# Patient Record
Sex: Female | Born: 1950 | Race: Black or African American | Hispanic: No | Smoking: Never smoker
Health system: Southern US, Community
[De-identification: ages and names within clinical notes are randomized; demographics above are authoritative.]

## PROBLEM LIST (undated history)

## (undated) DIAGNOSIS — K219 Gastro-esophageal reflux disease without esophagitis: Secondary | ICD-10-CM

## (undated) DIAGNOSIS — D649 Anemia, unspecified: Secondary | ICD-10-CM

## (undated) DIAGNOSIS — E559 Vitamin D deficiency, unspecified: Secondary | ICD-10-CM

## (undated) DIAGNOSIS — I714 Abdominal aortic aneurysm, without rupture, unspecified: Secondary | ICD-10-CM

## (undated) DIAGNOSIS — I10 Essential (primary) hypertension: Secondary | ICD-10-CM

## (undated) DIAGNOSIS — F419 Anxiety disorder, unspecified: Secondary | ICD-10-CM

## (undated) DIAGNOSIS — C189 Malignant neoplasm of colon, unspecified: Secondary | ICD-10-CM

## (undated) HISTORY — DX: Malignant neoplasm of colon, unspecified: C18.9

## (undated) HISTORY — DX: Gastro-esophageal reflux disease without esophagitis: K21.9

## (undated) HISTORY — DX: Anxiety disorder, unspecified: F41.9

---

## 2004-05-23 ENCOUNTER — Other Ambulatory Visit: Admission: RE | Admit: 2004-05-23 | Discharge: 2004-05-23 | Payer: Self-pay | Admitting: Obstetrics and Gynecology

## 2008-02-25 ENCOUNTER — Other Ambulatory Visit: Admission: RE | Admit: 2008-02-25 | Discharge: 2008-02-25 | Payer: Self-pay | Admitting: Family Medicine

## 2015-07-02 DIAGNOSIS — C189 Malignant neoplasm of colon, unspecified: Secondary | ICD-10-CM

## 2015-07-02 HISTORY — DX: Malignant neoplasm of colon, unspecified: C18.9

## 2015-09-15 ENCOUNTER — Encounter (HOSPITAL_COMMUNITY): Payer: Self-pay | Admitting: *Deleted

## 2015-09-15 ENCOUNTER — Emergency Department (HOSPITAL_COMMUNITY): Admission: EM | Admit: 2015-09-15 | Discharge: 2015-09-15 | Discharge status: Absent without leave | Payer: Self-pay

## 2015-09-15 ENCOUNTER — Inpatient Hospital Stay (HOSPITAL_COMMUNITY): Payer: Medicare Other

## 2015-09-15 ENCOUNTER — Inpatient Hospital Stay (HOSPITAL_COMMUNITY)
Admission: EM | Admit: 2015-09-15 | Discharge: 2015-09-18 | DRG: 372 | Disposition: A | Discharge status: Home / Self Care | Payer: Medicare Other | Attending: Surgery | Admitting: Surgery

## 2015-09-15 DIAGNOSIS — Z888 Allergy status to other drugs, medicaments and biological substances status: Secondary | ICD-10-CM | POA: Diagnosis not present

## 2015-09-15 DIAGNOSIS — Z91010 Allergy to peanuts: Secondary | ICD-10-CM

## 2015-09-15 DIAGNOSIS — N289 Disorder of kidney and ureter, unspecified: Secondary | ICD-10-CM | POA: Diagnosis present

## 2015-09-15 DIAGNOSIS — N859 Noninflammatory disorder of uterus, unspecified: Secondary | ICD-10-CM | POA: Diagnosis present

## 2015-09-15 DIAGNOSIS — K5732 Diverticulitis of large intestine without perforation or abscess without bleeding: Secondary | ICD-10-CM | POA: Diagnosis present

## 2015-09-15 DIAGNOSIS — R000 Tachycardia, unspecified: Secondary | ICD-10-CM | POA: Diagnosis present

## 2015-09-15 DIAGNOSIS — Z91018 Allergy to other foods: Secondary | ICD-10-CM | POA: Diagnosis not present

## 2015-09-15 DIAGNOSIS — K353 Acute appendicitis with localized peritonitis: Principal | ICD-10-CM | POA: Diagnosis present

## 2015-09-15 DIAGNOSIS — D649 Anemia, unspecified: Secondary | ICD-10-CM | POA: Diagnosis present

## 2015-09-15 DIAGNOSIS — E876 Hypokalemia: Secondary | ICD-10-CM | POA: Diagnosis present

## 2015-09-15 DIAGNOSIS — Z79899 Other long term (current) drug therapy: Secondary | ICD-10-CM | POA: Diagnosis not present

## 2015-09-15 DIAGNOSIS — R1030 Lower abdominal pain, unspecified: Secondary | ICD-10-CM | POA: Diagnosis present

## 2015-09-15 DIAGNOSIS — I10 Essential (primary) hypertension: Secondary | ICD-10-CM | POA: Diagnosis present

## 2015-09-15 DIAGNOSIS — Z91013 Allergy to seafood: Secondary | ICD-10-CM

## 2015-09-15 DIAGNOSIS — K3532 Acute appendicitis with perforation and localized peritonitis, without abscess: Secondary | ICD-10-CM | POA: Diagnosis present

## 2015-09-15 DIAGNOSIS — D259 Leiomyoma of uterus, unspecified: Secondary | ICD-10-CM | POA: Diagnosis present

## 2015-09-15 HISTORY — DX: Essential (primary) hypertension: I10

## 2015-09-15 LAB — CBC
HEMATOCRIT: 31.5 % — AB (ref 36.0–46.0)
Hemoglobin: 10.3 g/dL — ABNORMAL LOW (ref 12.0–15.0)
MCH: 24.1 pg — AB (ref 26.0–34.0)
MCHC: 32.7 g/dL (ref 30.0–36.0)
MCV: 73.6 fL — AB (ref 78.0–100.0)
PLATELETS: 455 10*3/uL — AB (ref 150–400)
RBC: 4.28 MIL/uL (ref 3.87–5.11)
RDW: 14.9 % (ref 11.5–15.5)
WBC: 8.4 10*3/uL (ref 4.0–10.5)

## 2015-09-15 LAB — URINALYSIS, ROUTINE W REFLEX MICROSCOPIC
BILIRUBIN URINE: NEGATIVE
GLUCOSE, UA: NEGATIVE mg/dL
HGB URINE DIPSTICK: NEGATIVE
KETONES UR: NEGATIVE mg/dL
LEUKOCYTES UA: NEGATIVE
Nitrite: NEGATIVE
PH: 7 (ref 5.0–8.0)
PROTEIN: NEGATIVE mg/dL
Specific Gravity, Urine: >1.046 — ABNORMAL HIGH (ref 1.005–1.030)

## 2015-09-15 LAB — APTT: aPTT: 32 seconds (ref 24–37)

## 2015-09-15 LAB — MAGNESIUM: MAGNESIUM: 2 mg/dL (ref 1.7–2.4)

## 2015-09-15 LAB — PROTIME-INR
INR: 1.14 (ref 0.00–1.49)
PROTHROMBIN TIME: 14.7 s (ref 11.6–15.2)

## 2015-09-15 LAB — COMPREHENSIVE METABOLIC PANEL
ALBUMIN: 3.5 g/dL (ref 3.5–5.0)
ALT: 15 U/L (ref 14–54)
AST: 18 U/L (ref 15–41)
Alkaline Phosphatase: 72 U/L (ref 38–126)
Anion gap: 14 (ref 5–15)
BUN: 11 mg/dL (ref 6–20)
CHLORIDE: 96 mmol/L — AB (ref 101–111)
CO2: 24 mmol/L (ref 22–32)
CREATININE: 1.01 mg/dL — AB (ref 0.44–1.00)
Calcium: 9.1 mg/dL (ref 8.9–10.3)
GFR calc Af Amer: >60 mL/min (ref >60)
GFR, EST NON AFRICAN AMERICAN: 57 mL/min — AB (ref >60)
GLUCOSE: 139 mg/dL — AB (ref 65–99)
Potassium: 2.9 mmol/L — ABNORMAL LOW (ref 3.5–5.1)
Sodium: 134 mmol/L — ABNORMAL LOW (ref 135–145)
Total Bilirubin: 0.6 mg/dL (ref 0.3–1.2)
Total Protein: 8 g/dL (ref 6.5–8.1)

## 2015-09-15 LAB — LIPASE, BLOOD: LIPASE: 25 U/L (ref 11–51)

## 2015-09-15 MED ORDER — ALPRAZOLAM 0.25 MG PO TABS: 0.2500 mg | ORAL_TABLET | Freq: Every day | ORAL | Status: DC | PRN

## 2015-09-15 MED ORDER — DIPHENHYDRAMINE HCL 12.5 MG/5ML PO ELIX: 12.5000 mg | ORAL_SOLUTION | Freq: Four times a day (QID) | ORAL | Status: DC | PRN

## 2015-09-15 MED ORDER — HEPARIN SODIUM (PORCINE) 5000 UNIT/ML IJ SOLN
5000.0000 [IU] | Freq: Three times a day (TID) | INTRAMUSCULAR | Status: DC
  Filled 2015-09-15 (×12): qty 1

## 2015-09-15 MED ORDER — AMLODIPINE BESYLATE 2.5 MG PO TABS
2.5000 mg | ORAL_TABLET | Freq: Every day | ORAL | Status: DC
  Administered 2015-09-17 – 2015-09-18 (×2): 2.5 mg via ORAL
  Filled 2015-09-15 (×3): qty 1

## 2015-09-15 MED ORDER — DIPHENHYDRAMINE HCL 50 MG/ML IJ SOLN: 12.5000 mg | Freq: Four times a day (QID) | INTRAMUSCULAR | Status: DC | PRN

## 2015-09-15 MED ORDER — SODIUM CHLORIDE 0.9 % IV BOLUS (SEPSIS): 250.0000 mL | Freq: Once | INTRAVENOUS | Status: DC

## 2015-09-15 MED ORDER — SODIUM CHLORIDE 0.9 % IV BOLUS (SEPSIS)
1000.0000 mL | Freq: Once | INTRAVENOUS | Status: AC
  Administered 2015-09-15: 1000 mL via INTRAVENOUS

## 2015-09-15 MED ORDER — POTASSIUM CHLORIDE CRYS ER 20 MEQ PO TBCR
20.0000 meq | EXTENDED_RELEASE_TABLET | Freq: Two times a day (BID) | ORAL | Status: AC
  Administered 2015-09-15 – 2015-09-17 (×4): 20 meq via ORAL
  Filled 2015-09-15 (×5): qty 1

## 2015-09-15 MED ORDER — POTASSIUM CHLORIDE IN NACL 40-0.9 MEQ/L-% IV SOLN
INTRAVENOUS | Status: DC
  Administered 2015-09-15 – 2015-09-17 (×6): 100 mL/h via INTRAVENOUS
  Filled 2015-09-15 (×9): qty 1000

## 2015-09-15 MED ORDER — IOHEXOL 300 MG/ML  SOLN
100.0000 mL | Freq: Once | INTRAMUSCULAR | Status: AC | PRN
  Administered 2015-09-15: 80 mL via INTRAVENOUS

## 2015-09-15 MED ORDER — PIPERACILLIN-TAZOBACTAM 3.375 G IVPB
3.3750 g | Freq: Three times a day (TID) | INTRAVENOUS | Status: DC
Start: 2015-09-15 — End: 2015-09-18
  Administered 2015-09-15 – 2015-09-18 (×9): 3.375 g via INTRAVENOUS
  Filled 2015-09-15 (×10): qty 50

## 2015-09-15 MED ORDER — MORPHINE SULFATE (PF) 2 MG/ML IV SOLN: 1.0000 mg | INTRAVENOUS | Status: DC | PRN

## 2015-09-15 NOTE — Progress Notes (Signed)
Interventional Radiology Progress Note  65 yo female admitted with Hx of ruptured appendicitis.    Repeat CT shows phlegmon, with no drainable fluid.  Discussed with Will Creig Hines of Surgery.  Patient is not currently septic. Observation with current care is reasonable.    VIR will do full consult, and follow admission.  Can reassess if patient does not approve  Call with questions/concerns.    Signed,  Dulcy Fanny. Earleen Newport, DO

## 2015-09-15 NOTE — H&P (Signed)
Reason for Consult: Perforated appendicitis Referring Physician: Dr. Kirby Funk (ED) Ob-gyn Dr. Imelda Pillow from [place] is an 65 y.o. female.  HPI: Pt presents to Ed this today. She has been seen at [facility] facility last Monday with some pain that started about a week ago. She had a discharge and was seen by her OB-GYN physician 09/09/15. She was a little tender on the right and was sent up for a CT scan that was scheduled and done today. Today she got a call and was sent to the ED with the following findings: CT found to have perforated appendix and pt came to ED here in [place] because her family is here in town. CT scan ordered shows: 1. Perforated appendicitis, with multiloculated abscess, fluid collections measuring up to 2.4 cm. Right adnexal mass measuring 6.8 cm, partially calcified. This could also be an exophytic fibroid. Probable exophytic left fibroid in the region of the left adnexa. Pt ate breakfast at 10 AM and got a call telling her she needed to go to the ED with perforated appendix. She still mildly tender just right of the midline below the umbilicus. We are ask to see.  Labs here in the ED shows WBC 8.4, K+ 2.9, magnesium is pending.  She is also somewhat tachycardic. She had her last meal about 10:30 AM today.  She denies any nausea, fever, vomiting or diarrhea with this abdominal pain.     Past Medical History  Diagnosis Date  . Hypertension     History reviewed. No pertinent past surgical history.  No family history on file.  Social History:  reports that she does not drink alcohol or use illicit drugs. Her tobacco history is not on file. Tobacco: None Drugs: None ETOH: None Retired Occupational hygienist   Allergies:  Allergies  Allergen Reactions  . Fish Allergy Itching and Swelling  . Peanut-Containing Drug Products     Itching throat  . Soy Allergy Other (See Comments)     Stomach aches  . Buspirone Anxiety    Prior to Admission medications   Medication Sig Start Date End Date Taking? Authorizing Provider  ALPRAZolam Duanne Moron) 0.25 MG tablet Take 0.25 mg by mouth daily as needed for anxiety.  08/18/15  Yes Historical Provider, MD  Cholecalciferol (VITAMIN D-3) 1000 units CAPS Take 1,000 Units by mouth daily.   Yes Historical Provider, MD  docusate sodium (COLACE) 100 MG capsule Take 100 mg by mouth daily as needed for mild constipation.   Yes Historical Provider, MD  NORVASC 2.5 MG tablet Take 2.5 mg by mouth daily. 09/13/15  Yes Historical Provider, MD  triamterene-hydrochlorothiazide (MAXZIDE-25) 37.5-25 MG tablet Take 0.5 tablets by mouth daily. 07/30/15  Yes Historical Provider, MD      Lab Results Last 48 Hours    No results found for this or any previous visit (from the past 48 hour(s)).     Imaging Results (Last 48 hours)    No results found.    Review of Systems  Constitutional: Positive for weight loss (since Nov 2016 she has lost a few pounds).  Eyes: Negative.  Respiratory: Negative.  Cardiovascular: Negative.  Gastrointestinal: Positive for abdominal pain and constipation. Negative for heartburn, nausea, vomiting, diarrhea, blood in stool and melena.  Genitourinary: Negative.  Musculoskeletal: Negative.  Skin: Negative.  Neurological: Negative.  Endo/Heme/Allergies: Negative.  Psychiatric/Behavioral: The patient is nervous/anxious.   Blood pressure 125/73, pulse 129, temperature 98.4 F (36.9 C), temperature source Oral, resp.  rate 14, SpO2 100 %. Physical Exam  Constitutional: She is oriented to person, place, and time. She appears well-developed and well-nourished. No distress.  HENT:  Head: Normocephalic and atraumatic.  Nose: Nose normal.  Eyes: Right eye exhibits no discharge. Left eye exhibits no discharge. No scleral icterus.  Neck: Neck supple. No JVD present. No tracheal  deviation present. No thyromegaly present.  Cardiovascular: Regular rhythm, normal heart sounds and intact distal pulses.  No murmur heard. Tachycardic 124  Respiratory: Effort normal and breath sounds normal. No respiratory distress. She has no wheezes. She has no rales. She exhibits no tenderness.  GI: Soft. Bowel sounds are normal. She exhibits no distension and no mass. There is tenderness (some tenderness just right of mid lower abdomen.). There is no rebound and no guarding.  Musculoskeletal: She exhibits no edema or tenderness.  Lymphadenopathy:   She has no cervical adenopathy.  Neurological: She is alert and oriented to person, place, and time. No cranial nerve deficit.  Skin: Skin is warm and dry. No rash noted. She is not diaphoretic. No erythema. No pallor.  Psychiatric: She has a normal mood and affect. Her behavior is normal. Judgment and thought content normal.    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  IR to see and decide if they can drain abscess. Starting IV fluids, and Zosyn. We will admit, I will replace K+, checking magnesium now.  EKG has also been ordered.  Corrin Hingle 09/15/2015, 3:06 PM

## 2015-09-15 NOTE — ED Provider Notes (Signed)
CSN: [id number]     Arrival date & time 09/15/15  1303 History   None    Chief Complaint  Patient presents with  . Abdominal Pain     (Consider location/radiation/quality/duration/timing/severity/associated sxs/prior Treatment) HPI Patient presents with lower abdominal pain. Notably, the patient's pain is actually improved with past day, and was most prominent about one week ago. Patient saw her physician, was referred for outpatient CT scan. Today, the patient received results, consistent with appendicitis. She notes that the pain is focally in her lower abdomen, pulling sensation, sore, moderate No associated fever, anorexia, vomiting, diarrhea. Patient has history of prior tubal ligation, otherwise no abdominal surgery.   Past Medical History  Diagnosis Date  . Hypertension    History reviewed. No pertinent past surgical history. No family history on file. Social History  Substance Use Topics  . Smoking status: None  . Smokeless tobacco: None  . Alcohol Use: No   OB History    No data available     Review of Systems  Constitutional:       Per HPI, otherwise negative  HENT:       Per HPI, otherwise negative  Respiratory:       Per HPI, otherwise negative  Cardiovascular:       Per HPI, otherwise negative  Gastrointestinal: Positive for abdominal pain. Negative for vomiting.  Endocrine:       Negative aside from HPI  Genitourinary:       Neg aside from HPI   Musculoskeletal:       Per HPI, otherwise negative  Skin: Negative.   Neurological: Negative for syncope.      Allergies  Fish allergy; Peanut-containing drug products; Soy allergy; and Buspirone  Home Medications   Prior to Admission medications   Medication Sig Start Date End Date Taking? Authorizing Provider  ALPRAZolam Duanne Moron) 0.25 MG tablet Take 0.25 mg by mouth daily as needed for anxiety.  08/18/15  Yes Historical Provider, MD  Cholecalciferol (VITAMIN D-3) 1000 units CAPS Take 1,000 Units  by mouth daily.   Yes Historical Provider, MD  docusate sodium (COLACE) 100 MG capsule Take 100 mg by mouth daily as needed for mild constipation.   Yes Historical Provider, MD  NORVASC 2.5 MG tablet Take 2.5 mg by mouth daily. 09/13/15  Yes Historical Provider, MD  triamterene-hydrochlorothiazide (MAXZIDE-25) 37.5-25 MG tablet Take 0.5 tablets by mouth daily. 07/30/15  Yes Historical Provider, MD   BP 130/90 mmHg  Pulse 127  Temp(Src) 98.4 F (36.9 C) (Oral)  Resp 16  SpO2 100% Physical Exam  Constitutional: She is oriented to person, place, and time. She appears well-developed and well-nourished. No distress.  HENT:  Head: Normocephalic and atraumatic.  Eyes: Conjunctivae and EOM are normal.  Cardiovascular: Normal rate and regular rhythm.   Pulmonary/Chest: Effort normal and breath sounds normal. No stridor. No respiratory distress.  Abdominal: She exhibits no distension. There is tenderness in the suprapubic area. There is no rigidity and no guarding.  Musculoskeletal: She exhibits no edema.  Neurological: She is alert and oriented to person, place, and time. No cranial nerve deficit.  Skin: Skin is warm and dry.  Psychiatric: She has a normal mood and affect.  Nursing note and vitals reviewed.   ED Course  Procedures (including critical care time) Labs Review Labs Reviewed  COMPREHENSIVE METABOLIC PANEL - Abnormal; Notable for the following:    Sodium 134 (*)    Potassium 2.9 (*)    Chloride 96 (*)  Glucose, Bld 139 (*)    Creatinine, Ser 1.01 (*)    GFR calc non Af Amer 57 (*)    All other components within normal limits  CBC - Abnormal; Notable for the following:    Hemoglobin 10.3 (*)    HCT 31.5 (*)    MCV 73.6 (*)    MCH 24.1 (*)    Platelets 455 (*)    All other components within normal limits  URINALYSIS, ROUTINE W REFLEX MICROSCOPIC (NOT AT [facility]) - Abnormal; Notable for the following:    Specific Gravity, Urine >1.046 (*)    All other components within  normal limits  LIPASE, BLOOD  CBC  CREATININE, SERUM  MAGNESIUM    After the initial evaluation I reviewed the patient's imaging results from another facility via care everywhere access portal.     CT IMAGING FROM OUTPATIENT STUDY (obtained from Care Everywhere) Addendum by Janine Ores, MD on 09/15/2015 11:56 AM There is also a cystic 1.5 cm right renal lesion with some internal  complexity.  Recommend renal ultrasound.  Telephone notes regarding wet reading are in the chart as already entered  by an Obgyn attending.   Result Impression    1. Perforated appendicitis, with multiloculated abscess, fluid collections measuring up to 2.4 cm. 2. Right adnexal mass measuring 6.8 cm, partially calcified. This could also be an exophytic fibroid. 3. Probable exophytic left fibroid in the region of the left adnexa.   Result Narrative  CT ABDOMEN PELVIS W CONTRAST (ROUTINE), 09/15/2015 9:44 AM  INDICATION: ABDOMINAL PAIN, [id number] Adnexal mass R10.31 Right lower quadrant abdominal pain  COMPARISON: None.  TECHNIQUE: Multislice axial images were obtained through the abdomen and pelvis with administration of iodinated intravenous contrast material. Multi-planar reformatted images were generated for additional analysis. Nongated technique limits cardiac detail.      [facility] and its affiliates are committed to minimizing radiation dose to patients while maintaining necessary diagnostic image quality. All CT scans are therefore performed using "As Low As Reasonably Achievable (ALARA)" protocols with either manual or automated exposure controls calibrated to the age and size of each patient.  FINDINGS:   LOWER CHEST: .  Mediastinum: Within normal limits.  .  Heart/vessel: A fluid structure noted on image 1 may be a small pericardial cyst, versus a duplication cyst.  .  Lungs: Within normal limits. .  Pleura: Within normal limits.   ABDOMEN: .  Liver:  Within normal limits. .  Gallbladder/biliary: Within normal limits. .  Spleen: Within normal limits. .  Pancreas: Within normal limits. .  Adrenals: Within normal limits. .  Kidneys: There is a right lower pole cystic cortical lesion measuring 1.5 cm with some internal areas of increased attenuation. Malrotated right kidney. . Peritoneum, mesentery and extra peritoneum: Stranding in the ileocecal region with loculated abscess formation, versus phlegmon.  , The largest loculation or area of phlegmon measures 2.4 cm.  .  GI tract: Enlarged hyperemic appendix seen on image 120. Reactive thickening of the cecum. .  Vascular: Within normal limits.  PELVIS:  .  Ureters: Within normal limits. .  Bladder: Within normal limits. .  Reproductive system: Right adnexal versus uterine mass which is partially calcified measuring 6.8 cm.  There is a small fundal subserosal fibroid. A left adnexal lesion actually has the appearance of a pedunculated fibroid on recent ultrasound.  .  Vascular: Within normal limits.  MSK: .  Within normal limits.   I discussed patient's case with  our surgical colleagues for admission.  Patient started on Zosyn, has continuously received IV fluids. MDM  Patient presents with abdominal pain, is found to have acute appendicitis with perforation and multiple loculated abscesses on CT scan performed earlier today. Here the patient is tachycardic, but awake, alert, answering questions properly. After discussion with our surgical team, and initiation of antibiotics, the patient was admitted for further evaluation and management.  Carmin Muskrat, MD 09/15/15 [phone]

## 2015-09-15 NOTE — ED Notes (Signed)
Bed: [id number] Expected date:  Expected time:  Means of arrival:  Comments: triage3

## 2015-09-15 NOTE — ED Notes (Signed)
PT CAN GO UP AT 15:40

## 2015-09-15 NOTE — Progress Notes (Signed)
Patient ID: Brittney Spencer, female   DOB: 03/05/1951, 65 y.o.   MRN: [id number]    Subjective: She has had lower abdominal discomfort that started at least 10 days ago. [facility]. i have personally reviewed her repeat CT here. This shows a significant phlegmon in the right lower quadrant consistent with perforated appendicitis or possibly cecal diverticulitis. She currently has really minimal if any pain.  Objective: Vital signs in last 24 hours: Temp:  [98.4 F (36.9 C)-99.2 F (37.3 C)] 99.2 F (37.3 C) (03/17 1726) Pulse Rate:  [116-129] 116 (03/17 1726) Resp:  [14-20] 15 (03/17 1617) BP: (114-137)/(73-90) 137/73 mmHg (03/17 1726) SpO2:  [100 %] 100 % (03/17 1726) Weight:  [60.873 kg (134 lb 3.2 oz)] 60.873 kg (134 lb 3.2 oz) (03/17 1753) Last BM Date: 09/14/15  Intake/Output from previous day:   Intake/Output this shift:    General appearance: alert, cooperative and no distress GI: there is a palpable mass in the right lower quadrant that is mildly tender. Certainly no peritoneal signs.  Lab Results:   Recent Labs  09/15/15 1458  WBC 8.4  HGB 10.3*  HCT 31.5*  PLT 455*   BMET  Recent Labs  09/15/15 1458  NA 134*  K 2.9*  CL 96*  CO2 24  GLUCOSE 139*  BUN 11  CREATININE 1.01*  CALCIUM 9.1     Studies/Results: Ct Abdomen Pelvis W Contrast  09/15/2015  CLINICAL DATA:  Ruptured appendicitis with abscess formation by outside CT. CT scan repeated for purposes of planning of percutaneous drainage. EXAM: CT ABDOMEN AND PELVIS WITH CONTRAST TECHNIQUE: Multidetector CT imaging of the abdomen and pelvis was performed using the standard protocol following bolus administration of intravenous contrast. CONTRAST:  9mL OMNIPAQUE IOHEXOL 300 MG/ML  SOLN COMPARISON:  None. FINDINGS: Lower chest:  Normal lung bases.  No pleural effusions identified. Hepatobiliary: The liver and gallbladder are unremarkable. No  biliary ductal dilatation identified. Pancreas: The pancreas is normal. Spleen: Normal size.  Multiple calcified granulomata present. Adrenals/Urinary Tract: No hydronephrosis. Duplicated right renal collecting system. Stomach/Bowel: Inflammatory process in the right lower quadrant present with irregularly enhancing region of inflammation inferior and medial to the cecal tip. Extent of the inflammatory process measures approximately 4.5 cm in greatest diameter and is largely enhancing tissue with very little liquefaction. Small serpiginous liquefied areas measure only approximately up to 8 mm in thickness and there is no discrete drainable abscess present. There is some adjacent inflammatory thickening of the terminal ileum. The process likely relates to ruptured appendicitis in this location. Cecal diverticulitis is another possibility. Inflammatory bowel disease is felt to be less likely. A calcified appendicolith is not visualized. No evidence of free intraperitoneal air. No ileus or associated small bowel obstruction. No masses or lymphadenopathy identified. Vascular/Lymphatic: No vascular abnormalities. Reproductive: The uterus is enlarged with a dominant posterior exophytic fibroid present demonstrating prominent degenerative calcification. No adnexal masses identified. Other: No ascites. Musculoskeletal: Bony structures are normal. IMPRESSION: Inflammatory process of the right lower quadrant likely representing ruptured appendicitis with inflammatory phlegmon. Additional differential consideration is diverticulitis at the tip of the cecum. Regional area of inflammatory tissue measures approximately 4.5 cm in greatest diameter. Very little liquefaction is present in this area with small serpiginous liquefied areas present measuring less than 1 cm in thickness. No discrete drainable abscess is identified. Electronically Signed   By: Aletta Edouard M.D.   On: 09/15/2015 17:13     Anti-infectives: Anti-infectives  Start     Dose/Rate Route Frequency Ordered Stop   09/15/15 1600  piperacillin-tazobactam (ZOSYN) IVPB 3.375 g     3.375 g 12.5 mL/hr over 240 Minutes Intravenous 3 times per day 09/15/15 1550        Assessment/Plan: Likely perforated appendicitis or cecal diverticulitis that has been ongoing for at least 10 days. She is very stable and very minimally tender with a normal white blood count. I think the best approach is IV antibiotics and nonsurgical management and repeat CT scan at a later date with consideration of possibly an interval appendectomy. I think immediate surgery would likely result in an open procedure and possibly ileocecectomy. This was all explained to the patient including options and she is in agreement.     LOS: 0 days    Kristine Tiley T 09/15/2015

## 2015-09-15 NOTE — ED Notes (Signed)
CT scan this am showed perforated appendix.

## 2015-09-16 LAB — BASIC METABOLIC PANEL
Anion gap: 9 (ref 5–15)
BUN: 7 mg/dL (ref 6–20)
CHLORIDE: 103 mmol/L (ref 101–111)
CO2: 25 mmol/L (ref 22–32)
CREATININE: 0.81 mg/dL (ref 0.44–1.00)
Calcium: 8.4 mg/dL — ABNORMAL LOW (ref 8.9–10.3)
GFR calc Af Amer: >60 mL/min (ref >60)
GFR calc non Af Amer: >60 mL/min (ref >60)
Glucose, Bld: 98 mg/dL (ref 65–99)
Potassium: 3.5 mmol/L (ref 3.5–5.1)
SODIUM: 137 mmol/L (ref 135–145)

## 2015-09-16 LAB — CBC
HCT: 26.1 % — ABNORMAL LOW (ref 36.0–46.0)
Hemoglobin: 8.5 g/dL — ABNORMAL LOW (ref 12.0–15.0)
MCH: 24.2 pg — ABNORMAL LOW (ref 26.0–34.0)
MCHC: 32.6 g/dL (ref 30.0–36.0)
MCV: 74.4 fL — AB (ref 78.0–100.0)
PLATELETS: 346 10*3/uL (ref 150–400)
RBC: 3.51 MIL/uL — ABNORMAL LOW (ref 3.87–5.11)
RDW: 15.1 % (ref 11.5–15.5)
WBC: 6.2 10*3/uL (ref 4.0–10.5)

## 2015-09-16 NOTE — Progress Notes (Signed)
After CT scan reviewed by Dr. Anselm Pancoast, he does not feel as if this area is drainable.  It is mostly phlegmonous with no liquefaction.  If no surgery planned, he would recommend abx therapy and possible repeat CT in several days to see if it becomes drainable.  D/W Dr. Marcello Moores of general surgery.  Angela Platner E 9:23 AM 09/16/2015

## 2015-09-16 NOTE — Progress Notes (Signed)
Patient ID: Brittney Spencer, female   DOB: [birth year], 65 y.o.   MRN: [id number]    Subjective: No complaints this morning. Has been walking in the halls. Tolerating clear liquids.  Denies abdominal pain. Had a small bowel movement.  Objective: Vital signs in last 24 hours: Temp:  [98.4 F (36.9 C)-99.6 F (37.6 C)] 99.1 F (37.3 C) (03/18 0534) Pulse Rate:  [92-129] 96 (03/18 0534) Resp:  [14-20] 16 (03/18 0534) BP: (101-137)/(56-90) 110/61 mmHg (03/18 0534) SpO2:  [100 %] 100 % (03/18 0534) Weight:  [60.873 kg (134 lb 3.2 oz)] 60.873 kg (134 lb 3.2 oz) (03/17 1753) Last BM Date: 09/15/15  Intake/Output from previous day: 03/17 0701 - 03/18 0700 In: 1340 [P.O.:250; I.V.:990; IV Piggyback:100] Out: 250 [Urine:250] Intake/Output this shift: Total I/O In: 240 [P.O.:240] Out: -   General appearance: alert, cooperative and no distress GI: minimal if any tenderness in the right lower quadrant. Still with palpable mass but does seem less prominent.  Lab Results:   Recent Labs  09/15/15 1458 09/16/15 0508  WBC 8.4 6.2  HGB 10.3* 8.5*  HCT 31.5* 26.1*  PLT 455* 346   BMET  Recent Labs  09/15/15 1458 09/16/15 0508  NA 134* 137  K 2.9* 3.5  CL 96* 103  CO2 24 25  GLUCOSE 139* 98  BUN 11 7  CREATININE 1.01* 0.81  CALCIUM 9.1 8.4*     Studies/Results: Ct Abdomen Pelvis W Contrast  09/15/2015  CLINICAL DATA:  Ruptured appendicitis with abscess formation by outside CT. CT scan repeated for purposes of planning of percutaneous drainage. EXAM: CT ABDOMEN AND PELVIS WITH CONTRAST TECHNIQUE: Multidetector CT imaging of the abdomen and pelvis was performed using the standard protocol following bolus administration of intravenous contrast. CONTRAST:  58mL OMNIPAQUE IOHEXOL 300 MG/ML  SOLN COMPARISON:  None. FINDINGS: Lower chest:  Normal lung bases.  No pleural effusions identified. Hepatobiliary: The liver and gallbladder are unremarkable. No biliary ductal dilatation  identified. Pancreas: The pancreas is normal. Spleen: Normal size.  Multiple calcified granulomata present. Adrenals/Urinary Tract: No hydronephrosis. Duplicated right renal collecting system. Stomach/Bowel: Inflammatory process in the right lower quadrant present with irregularly enhancing region of inflammation inferior and medial to the cecal tip. Extent of the inflammatory process measures approximately 4.5 cm in greatest diameter and is largely enhancing tissue with very little liquefaction. Small serpiginous liquefied areas measure only approximately up to 8 mm in thickness and there is no discrete drainable abscess present. There is some adjacent inflammatory thickening of the terminal ileum. The process likely relates to ruptured appendicitis in this location. Cecal diverticulitis is another possibility. Inflammatory bowel disease is felt to be less likely. A calcified appendicolith is not visualized. No evidence of free intraperitoneal air. No ileus or associated small bowel obstruction. No masses or lymphadenopathy identified. Vascular/Lymphatic: No vascular abnormalities. Reproductive: The uterus is enlarged with a dominant posterior exophytic fibroid present demonstrating prominent degenerative calcification. No adnexal masses identified. Other: No ascites. Musculoskeletal: Bony structures are normal. IMPRESSION: Inflammatory process of the right lower quadrant likely representing ruptured appendicitis with inflammatory phlegmon. Additional differential consideration is diverticulitis at the tip of the cecum. Regional area of inflammatory tissue measures approximately 4.5 cm in greatest diameter. Very little liquefaction is present in this area with small serpiginous liquefied areas present measuring less than 1 cm in thickness. No discrete drainable abscess is identified. Electronically Signed   By: Aletta Edouard M.D.   On: 09/15/2015 17:13    Anti-infectives: Anti-infectives  Start      Dose/Rate Route Frequency Ordered Stop   09/15/15 1600  piperacillin-tazobactam (ZOSYN) IVPB 3.375 g     3.375 g 12.5 mL/hr over 240 Minutes Intravenous 3 times per day 09/15/15 1550        Assessment/Plan: Right lower quadrant phlegmon.  Apparent perforated appendicitis versus  Cecal diverticulitis. Very stable. Improved on IV antibiotics. Continue current treatment. Full liquid diet.    LOS: 1 day    Brittney Spencer T 09/16/2015

## 2015-09-16 NOTE — Progress Notes (Signed)
Pt refused heparin.  RN stated importance of heparin and printed out pt handouts for pt to review.  Offered to call pharmacy.  Pt states she would read over material.  Pt is wearing SCD hose and frequently doing ankle pumps and walking to BR

## 2015-09-17 NOTE — Progress Notes (Signed)
Patient ID: Brittney Spencer, female   DOB: [birth year], 65 y.o.   MRN: [id number] Patient ID: Brittney Spencer, female   DOB: 1951-03-08, 65 y.o.   MRN: [id number]    Subjective: No complaints this morning. Has been walking in the halls. Tolerating clear liquids.  Denies abdominal pain. Had a normal bowel movement.  Objective: Vital signs in last 24 hours: Temp:  [99 F (37.2 C)-99.6 F (37.6 C)] 99 F (37.2 C) (03/19 0552) Pulse Rate:  [99-120] 109 (03/19 0552) Resp:  [16-18] 16 (03/19 0552) BP: (92-115)/(57-92) 114/67 mmHg (03/19 0552) SpO2:  [100 %] 100 % (03/19 0552) Last BM Date: 09/16/15  Intake/Output from previous day: 03/18 0701 - 03/19 0700 In: 3268.3 [P.O.:720; I.V.:2398.3; IV Piggyback:150] Out: 1301 [Urine:1300; Stool:1] Intake/Output this shift:    General appearance: alert, cooperative and no distress GI: minimal if any tenderness in the right lower quadrant. Still with palpable mass but does seem less prominent.  Lab Results:   Recent Labs  09/15/15 1458 09/16/15 0508  WBC 8.4 6.2  HGB 10.3* 8.5*  HCT 31.5* 26.1*  PLT 455* 346   BMET  Recent Labs  09/15/15 1458 09/16/15 0508  NA 134* 137  K 2.9* 3.5  CL 96* 103  CO2 24 25  GLUCOSE 139* 98  BUN 11 7  CREATININE 1.01* 0.81  CALCIUM 9.1 8.4*     Studies/Results: Ct Abdomen Pelvis W Contrast  09/15/2015  CLINICAL DATA:  Ruptured appendicitis with abscess formation by outside CT. CT scan repeated for purposes of planning of percutaneous drainage. EXAM: CT ABDOMEN AND PELVIS WITH CONTRAST TECHNIQUE: Multidetector CT imaging of the abdomen and pelvis was performed using the standard protocol following bolus administration of intravenous contrast. CONTRAST:  35mL OMNIPAQUE IOHEXOL 300 MG/ML  SOLN COMPARISON:  None. FINDINGS: Lower chest:  Normal lung bases.  No pleural effusions identified. Hepatobiliary: The liver and gallbladder are unremarkable. No biliary ductal dilatation identified. Pancreas: The  pancreas is normal. Spleen: Normal size.  Multiple calcified granulomata present. Adrenals/Urinary Tract: No hydronephrosis. Duplicated right renal collecting system. Stomach/Bowel: Inflammatory process in the right lower quadrant present with irregularly enhancing region of inflammation inferior and medial to the cecal tip. Extent of the inflammatory process measures approximately 4.5 cm in greatest diameter and is largely enhancing tissue with very little liquefaction. Small serpiginous liquefied areas measure only approximately up to 8 mm in thickness and there is no discrete drainable abscess present. There is some adjacent inflammatory thickening of the terminal ileum. The process likely relates to ruptured appendicitis in this location. Cecal diverticulitis is another possibility. Inflammatory bowel disease is felt to be less likely. A calcified appendicolith is not visualized. No evidence of free intraperitoneal air. No ileus or associated small bowel obstruction. No masses or lymphadenopathy identified. Vascular/Lymphatic: No vascular abnormalities. Reproductive: The uterus is enlarged with a dominant posterior exophytic fibroid present demonstrating prominent degenerative calcification. No adnexal masses identified. Other: No ascites. Musculoskeletal: Bony structures are normal. IMPRESSION: Inflammatory process of the right lower quadrant likely representing ruptured appendicitis with inflammatory phlegmon. Additional differential consideration is diverticulitis at the tip of the cecum. Regional area of inflammatory tissue measures approximately 4.5 cm in greatest diameter. Very little liquefaction is present in this area with small serpiginous liquefied areas present measuring less than 1 cm in thickness. No discrete drainable abscess is identified. Electronically Signed   By: Aletta Edouard M.D.   On: 09/15/2015 17:13    Anti-infectives: Anti-infectives    Start  Dose/Rate Route Frequency Ordered  Stop   09/15/15 1600  piperacillin-tazobactam (ZOSYN) IVPB 3.375 g     3.375 g 12.5 mL/hr over 240 Minutes Intravenous 3 times per day 09/15/15 1550        Assessment/Plan: Right lower quadrant phlegmon.  Apparent perforated appendicitis versus  Cecal diverticulitis. Very stable. Improved on IV antibiotics. Continue current treatment. Full liquid diet. Check CBC in AM.  Possibly home in AM on oral abx.   LOS: 2 days    Turki Tapanes T 09/17/2015

## 2015-09-17 NOTE — Progress Notes (Signed)
Utilization Review Completed.Brittney Spencer T3/19/2017  

## 2015-09-17 NOTE — Progress Notes (Signed)
Nutrition Brief Note  Patient identified on the Malnutrition Screening Tool (MST) Report  Wt Readings from Last 15 Encounters:  09/15/15 134 lb 3.2 oz (60.873 kg)    Body mass index is 22.33 kg/(m^2). Patient meets criteria for normal based on current BMI.   Current diet order is FULL LIQUID, patient is consuming approximately 100% of meals at this time. Labs and medications reviewed.   No nutrition interventions warranted at this time. If nutrition issues arise, please consult RD.   Satira Anis. [place], MS, RD LDN After Hours/Weekend Pager [phone]

## 2015-09-18 LAB — BASIC METABOLIC PANEL
ANION GAP: 10 (ref 5–15)
BUN: <5 mg/dL — ABNORMAL LOW (ref 6–20)
CHLORIDE: 109 mmol/L (ref 101–111)
CO2: 23 mmol/L (ref 22–32)
Calcium: 8.5 mg/dL — ABNORMAL LOW (ref 8.9–10.3)
Creatinine, Ser: 0.78 mg/dL (ref 0.44–1.00)
GFR calc non Af Amer: >60 mL/min (ref >60)
GLUCOSE: 96 mg/dL (ref 65–99)
Potassium: 4.3 mmol/L (ref 3.5–5.1)
Sodium: 142 mmol/L (ref 135–145)

## 2015-09-18 LAB — CBC
HEMATOCRIT: 28 % — AB (ref 36.0–46.0)
HEMOGLOBIN: 8.6 g/dL — AB (ref 12.0–15.0)
MCH: 24.2 pg — ABNORMAL LOW (ref 26.0–34.0)
MCHC: 30.7 g/dL (ref 30.0–36.0)
MCV: 78.7 fL (ref 78.0–100.0)
Platelets: 342 10*3/uL (ref 150–400)
RBC: 3.56 MIL/uL — ABNORMAL LOW (ref 3.87–5.11)
RDW: 15.7 % — ABNORMAL HIGH (ref 11.5–15.5)
WBC: 3.8 10*3/uL — AB (ref 4.0–10.5)

## 2015-09-18 MED ORDER — ACETAMINOPHEN 325 MG PO TABS: 650.0000 mg | ORAL_TABLET | Freq: Four times a day (QID) | ORAL | Status: DC | PRN

## 2015-09-18 MED ORDER — SACCHAROMYCES BOULARDII 250 MG PO CAPS: ORAL_CAPSULE | ORAL | Status: DC

## 2015-09-18 MED ORDER — OXYCODONE-ACETAMINOPHEN 5-325 MG PO TABS: 1.0000 | ORAL_TABLET | ORAL | Status: DC | PRN

## 2015-09-18 MED ORDER — POLYETHYLENE GLYCOL 3350 17 G PO PACK: 17.0000 g | PACK | Freq: Every day | ORAL | Status: DC

## 2015-09-18 MED ORDER — OXYCODONE-ACETAMINOPHEN 5-325 MG PO TABS
1.0000 | ORAL_TABLET | ORAL | Status: DC | PRN
Start: 2015-09-18 — End: 2015-09-18

## 2015-09-18 MED ORDER — POLYETHYLENE GLYCOL 3350 17 G PO PACK: PACK | ORAL | Status: DC

## 2015-09-18 MED ORDER — AMOXICILLIN-POT CLAVULANATE 875-125 MG PO TABS: 1.0000 | ORAL_TABLET | Freq: Two times a day (BID) | ORAL | Status: DC

## 2015-09-18 MED ORDER — AMOXICILLIN-POT CLAVULANATE 875-125 MG PO TABS
1.0000 | ORAL_TABLET | Freq: Two times a day (BID) | ORAL | Status: DC
  Administered 2015-09-18: 1 via ORAL
  Filled 2015-09-18 (×2): qty 1

## 2015-09-18 NOTE — Progress Notes (Signed)
Ambulating around the unit several times, tolerating well.

## 2015-09-18 NOTE — Care Management Important Message (Signed)
Important Message  Patient Details  Name: Brittney Spencer MRN: [id number] Date of Birth: 01-May-1951   Medicare Important Message Given:  Yes    Camillo Flaming 09/18/2015, 11:52 AMImportant Message  Patient Details  Name: Brittney Spencer MRN: [id number] Date of Birth: 1951/06/28   Medicare Important Message Given:  Yes    Camillo Flaming 09/18/2015, 11:51 AM

## 2015-09-18 NOTE — Progress Notes (Signed)
  Subjective: She feels fine, no real pain issues, tolerating full liquids, voiding and BM without issue.    Objective: Vital signs in last 24 hours: Temp:  [98.1 F (36.7 C)-98.7 F (37.1 C)] 98.1 F (36.7 C) (03/20 0530) Pulse Rate:  [95-115] 103 (03/20 0530) Resp:  [14-17] 17 (03/20 0530) BP: (116-126)/(62-75) 126/69 mmHg (03/20 0530) SpO2:  [100 %] 100 % (03/20 0530) Last BM Date: 09/16/15 PO 1320  Diet: full liquids Voided x 11 BM x 1 Afebrile, VSS No labs Repeat CT Intake/Output from previous day: 03/19 0701 - 03/20 0700 In: 3871.7 [P.O.:1320; I.V.:2401.7; IV Piggyback:150] Out: -  Intake/Output this shift:    General appearance: alert, cooperative and no distress GI: soft, non-tender; bowel sounds normal; no masses,  no organomegaly  Lab Results:   Recent Labs  09/15/15 1458 09/16/15 0508  WBC 8.4 6.2  HGB 10.3* 8.5*  HCT 31.5* 26.1*  PLT 455* 346    BMET  Recent Labs  09/15/15 1458 09/16/15 0508  NA 134* 137  K 2.9* 3.5  CL 96* 103  CO2 24 25  GLUCOSE 139* 98  BUN 11 7  CREATININE 1.01* 0.81  CALCIUM 9.1 8.4*   PT/INR  Recent Labs  09/15/15 1623  LABPROT 14.7  INR 1.14     Recent Labs Lab 09/15/15 1458  AST 18  ALT 15  ALKPHOS 72  BILITOT 0.6  PROT 8.0  ALBUMIN 3.5     Lipase     Component Value Date/Time   LIPASE 25 09/15/2015 1458     Studies/Results: No results found.  Medications: . amLODipine  2.5 mg Oral Daily  . heparin  5,000 Units Subcutaneous 3 times per day  . piperacillin-tazobactam (ZOSYN)  IV  3.375 g Intravenous 3 times per day  . sodium chloride  250 mL Intravenous Once    Assessment/Plan Right lower quadrant phlegmon. Apparent perforated appendicitis versus Cecal diverticulitis Right adnexal mass 6.8 cm; left fibroid 1.5 cm cystic right renal lesion with some internal complexity Hypertension  Hypokalemia Antibiotics:  Zosyn day 4 DVT:  Heparin/SCD   Plan:  Saline lock IV, start  Augmentin, and soft diet.  Recheck labs and discuss discharge with DR. Gerkin.  LOS: 3 days    Brittney Spencer 09/18/2015

## 2015-09-18 NOTE — Progress Notes (Signed)
Pt has been refusing her heparin since admission.  She states that she has "been walking and don't need it."

## 2015-09-18 NOTE — Progress Notes (Signed)
Discharge instructions gone over with pt, prescriptions given to patient by MD. Pt states understanding instructions. Sister in to take pt home. Discharged via wheelchair.

## 2015-09-18 NOTE — Discharge Instructions (Signed)
Appendicitis Appendicitis is when the appendix is swollen (inflamed). The inflammation can lead to developing a hole (perforation) and a collection of pus (abscess). CAUSES  There is not always an obvious cause of appendicitis. Sometimes it is caused by an obstruction in the appendix. The obstruction can be caused by:  A small, hard, pea-sized ball of stool (fecalith).  Enlarged lymph glands in the appendix. SYMPTOMS   Pain around your belly button (navel) that moves toward your lower right belly (abdomen). The pain can become more severe and sharp as time passes.  Tenderness in the lower right abdomen. Pain gets worse if you cough or make a sudden movement.  Feeling sick to your stomach (nauseous).  Throwing up (vomiting).  Loss of appetite.  Fever.  Constipation.  Diarrhea.  Generally not feeling well. DIAGNOSIS   Physical exam.  Blood tests.  Urine test.  X-rays or a CT scan may confirm the diagnosis. TREATMENT  Once the diagnosis of appendicitis is made, the most common treatment is to remove the appendix as soon as possible. This procedure is called appendectomy. In an open appendectomy, a cut (incision) is made in the lower right abdomen and the appendix is removed. In a laparoscopic appendectomy, usually 3 small incisions are made. Long, thin instruments and a camera tube are used to remove the appendix. Most patients go home in 24 to 48 hours after appendectomy. In some situations, the appendix may have already perforated and an abscess may have formed. The abscess may have a "wall" around it as seen on a CT scan. In this case, a drain may be placed into the abscess to remove fluid, and you may be treated with antibiotic medicines that kill germs. The medicine is given through a tube in your vein (IV). Once the abscess has resolved, it may or may not be necessary to have an appendectomy. You may need to stay in the hospital longer than 48 hours.   This information is  not intended to replace advice given to you by your health care provider. Make sure you discuss any questions you have with your health care provider.   Document Released: 06/17/2005 Document Revised: 12/17/2011 Document Reviewed: 11/02/2014 Elsevier Interactive Patient Education 2016 [employer]  

## 2015-09-18 NOTE — Discharge Summary (Signed)
Physician Discharge Summary  Patient ID: Brittney Spencer MRN: [id number] DOB/AGE: Jul 04, 1950 65 y.o. PCP:  Sherolyn Buba MD OB-GYN:  Yevonne Pax MD Admit date: 09/15/2015 Discharge date: 09/18/2015  Admission Diagnoses:  Perforated appendix with multi loculated abscess Right adnexal mass 6.8 cm; left fibroid 1.5 cm cystic right renal lesion with some internal complexity Hypertension  Hypokalemia  Discharge Diagnoses:  Right lower quadrant phlegmon. Apparent perforated appendicitis versus Cecal diverticulitis Right adnexal mass 6.8 cm; left fibroid 1.5 cm cystic right renal lesion with some internal complexity Hypertension  Hypokalemia resolved Mild anemia   Active Problems:   Perforated appendicitis   PROCEDURES: None Hospital Course:  Pt presents to Ed this today. She has been seen at [facility] facility last Monday with some pain that started about a week ago. She had a discharge and was seen by her OB-GYN physician 09/09/15. She was a little tender on the right and was sent up for a CT scan that was scheduled and done today. Today she got a call and was sent to the ED with the following findings: CT found to have perforated appendix and pt came to ED here in [place] because her family is here in town. CT scan ordered shows: 1. Perforated appendicitis, with multiloculated abscess, fluid collections measuring up to 2.4 cm. Right adnexal mass measuring 6.8 cm, partially calcified. This could also be an exophytic fibroid. Probable exophytic left fibroid in the region of the left adnexa. Pt ate breakfast at 10 AM and got a call telling her she needed to go to the ED with perforated appendix. She still mildly tender just right of the midline below the umbilicus. We are ask to see.  Labs here in the ED shows WBC 8.4, K+ 2.9, magnesium is pending. She is also somewhat tachycardic. She had her last meal about 10:30 AM today. She denies any nausea, fever, vomiting  or diarrhea with this abdominal pain.  On exam in the ED seemed much more comfortable than we would have thought.  Our radiology department could not access the study done earlier in the day.  In order to place a drain, a new CT was required.  This was done and was read an more of a phlegmon.  She was admitted and place on antibiotics.  Her diet has been advanced.  She has no real pain.  She tolerates her soft diet well and she is going home on 2 weeks of Miralax and Augmentin.  She will follow up with Dr. [name] in 2 weeks.  She is to follow up with her OB and PCP with additional findings on CT.  Condition on d/c:  Improved   CBC Latest Ref Rng 09/18/2015 09/16/2015 09/15/2015  WBC 4.0 - 10.5 K/uL 3.8(L) 6.2 8.4  Hemoglobin 12.0 - 15.0 g/dL 8.6(L) 8.5(L) 10.3(L)  Hematocrit 36.0 - 46.0 % 28.0(L) 26.1(L) 31.5(L)  Platelets 150 - 400 K/uL 342 346 455(H)   CMP Latest Ref Rng 09/18/2015 09/16/2015 09/15/2015  Glucose 65 - 99 mg/dL 96 98 139(H)  BUN 6 - 20 mg/dL <5(L) 7 11  Creatinine 0.44 - 1.00 mg/dL 0.78 0.81 1.01(H)  Sodium 135 - 145 mmol/L 142 137 134(L)  Potassium 3.5 - 5.1 mmol/L 4.3 3.5 2.9(L)  Chloride 101 - 111 mmol/L 109 103 96(L)  CO2 22 - 32 mmol/L 23 25 24   Calcium 8.9 - 10.3 mg/dL 8.5(L) 8.4(L) 9.1  Total Protein 6.5 - 8.1 g/dL - - 8.0  Total Bilirubin 0.3 - 1.2 mg/dL - -  0.6  Alkaline Phos 38 - 126 U/L - - 72  AST 15 - 41 U/L - - 18  ALT 14 - 54 U/L - - 15   CT here at [facility] 09/15/15:  FINDINGS: Lower chest: Normal lung bases. No pleural effusions identified.  Hepatobiliary: The liver and gallbladder are unremarkable. No biliary ductal dilatation identified.  Pancreas: The pancreas is normal.  Spleen: Normal size. Multiple calcified granulomata present.  Adrenals/Urinary Tract: No hydronephrosis. Duplicated right renal collecting system.  Stomach/Bowel: Inflammatory process in the right lower quadrant present with irregularly enhancing region of inflammation  inferior and medial to the cecal tip. Extent of the inflammatory process measures approximately 4.5 cm in greatest diameter and is largely enhancing tissue with very little liquefaction. Small serpiginous liquefied areas measure only approximately up to 8 mm in thickness and there is no discrete drainable abscess present.  There is some adjacent inflammatory thickening of the terminal ileum. The process likely relates to ruptured appendicitis in this location. Cecal diverticulitis is another possibility. Inflammatory bowel disease is felt to be less likely. A calcified appendicolith is not visualized. No evidence of free intraperitoneal air. No ileus or associated small bowel obstruction. No masses or lymphadenopathy identified.  Vascular/Lymphatic: No vascular abnormalities.  Reproductive: The uterus is enlarged with a dominant posterior exophytic fibroid present demonstrating prominent degenerative calcification. No adnexal masses identified.  Other: No ascites.  Musculoskeletal: Bony structures are normal.  IMPRESSION: Inflammatory process of the right lower quadrant likely representing ruptured appendicitis with inflammatory phlegmon. Additional differential consideration is diverticulitis at the tip of the cecum. Regional area of inflammatory tissue measures approximately 4.5 cm in greatest diameter. Very little liquefaction is present in this area with small serpiginous liquefied areas present measuring less than 1 cm in thickness. No discrete drainable abscess is identified. Dr. Kathlene Cote  Disposition: DISCHARGED HOME     Medication List    STOP taking these medications        docusate sodium 100 MG capsule  Commonly known as:  COLACE      TAKE these medications        acetaminophen 325 MG tablet  Commonly known as:  TYLENOL  Take 2 tablets (650 mg total) by mouth every 6 (six) hours as needed for mild pain, moderate pain, fever or headache.     ALPRAZolam  0.25 MG tablet  Commonly known as:  XANAX  Take 0.25 mg by mouth daily as needed for anxiety.     amoxicillin-clavulanate 875-125 MG tablet  Commonly known as:  AUGMENTIN  Take 1 tablet by mouth every 12 (twelve) hours.     NORVASC 2.5 MG tablet  Generic drug:  amLODipine  Take 2.5 mg by mouth daily.     oxyCODONE-acetaminophen 5-325 MG tablet  Commonly known as:  PERCOCET/ROXICET  Take 1-2 tablets by mouth every 4 (four) hours as needed for moderate pain.     polyethylene glycol packet  Commonly known as:  MIRALAX / GLYCOLAX  1 dose daily, you can buy this as any pharmacy without a prescription.     saccharomyces boulardii 250 MG capsule  Commonly known as:  FLORASTOR  Your can buy this at any pharmacy without a prescription.Take for at least 2 weeks after you finish antibiotics.     triamterene-hydrochlorothiazide 37.5-25 MG tablet  Commonly known as:  MAXZIDE-25  Take 0.5 tablets by mouth daily.     Vitamin D-3 1000 units Caps  Take 1,000 Units by mouth daily.  Follow-up Information    Follow up with HOXWORTH,BENJAMIN T, MD. Schedule an appointment as soon as possible for a visit in 2 weeks.   Specialty:  General Surgery   Contact information:   [place] [id number] [phone]       Signed: Earnstine Regal 09/18/2015, 3:08 PM

## 2015-10-06 ENCOUNTER — Other Ambulatory Visit: Payer: Self-pay | Admitting: General Surgery

## 2015-10-06 DIAGNOSIS — R1903 Right lower quadrant abdominal swelling, mass and lump: Secondary | ICD-10-CM

## 2015-10-20 ENCOUNTER — Ambulatory Visit
Admission: RE | Admit: 2015-10-20 | Discharge: 2015-10-20 | Disposition: A | Discharge status: Home / Self Care | Payer: Medicare Other | Source: Ambulatory Visit | Attending: General Surgery | Admitting: General Surgery

## 2015-10-20 DIAGNOSIS — R1903 Right lower quadrant abdominal swelling, mass and lump: Secondary | ICD-10-CM

## 2015-10-20 MED ORDER — IOPAMIDOL (ISOVUE-300) INJECTION 61%
100.0000 mL | Freq: Once | INTRAVENOUS | Status: AC | PRN
  Administered 2015-10-20: 100 mL via INTRAVENOUS

## 2015-11-03 ENCOUNTER — Encounter: Payer: Self-pay | Admitting: Gastroenterology

## 2015-12-30 HISTORY — PX: OTHER SURGICAL HISTORY: SHX169

## 2016-01-05 ENCOUNTER — Telehealth: Payer: Self-pay | Admitting: *Deleted

## 2016-01-05 ENCOUNTER — Ambulatory Visit (AMBULATORY_SURGERY_CENTER): Payer: Self-pay | Admitting: *Deleted

## 2016-01-05 VITALS — Ht 65.0 in | Wt 130.8 lb

## 2016-01-05 DIAGNOSIS — R933 Abnormal findings on diagnostic imaging of other parts of digestive tract: Secondary | ICD-10-CM

## 2016-01-05 DIAGNOSIS — K6389 Other specified diseases of intestine: Secondary | ICD-10-CM

## 2016-01-05 DIAGNOSIS — D649 Anemia, unspecified: Secondary | ICD-10-CM

## 2016-01-05 MED ORDER — NA SULFATE-K SULFATE-MG SULF 17.5-3.13-1.6 GM/177ML PO SOLN: ORAL | Status: DC

## 2016-01-05 NOTE — Telephone Encounter (Signed)
Dr [name]:  I saw this pt in [place] today;  pt is scheduled for direct colonoscopy 01/19/16.  She was seen in ER in March for possible perforated appendix.  She is now being referred here for possible cecal mass by Dr. [name]. Please review CT scan from 3/17 and 4/21.  Is pt ok for direct colonoscopy or does she need an OV with you first?  Thanks, Juliann Pulse in [facility]

## 2016-01-05 NOTE — Telephone Encounter (Signed)
Patty: Pt notified that colonoscopy will proceed as scheduled.  Pt aware that office will call to schedule CBC for next week.  Thanks, Juliann Pulse

## 2016-01-05 NOTE — Progress Notes (Signed)
No allergies to eggs or soy. No prior anesthesia.  Pt  Not given Emmi instructions for colonoscopy; not interested  No oxygen use  No diet drug use

## 2016-01-05 NOTE — Telephone Encounter (Signed)
I reviewed the hospital discharge summary and the CT scan report. Yes, it is OK for her to have the direct book colonoscopy on 7/21.  However, her last hemoglobin on file in Epic was 8.5 back in March.  So please make arrangements for her to have a CBC next week.

## 2016-01-05 NOTE — Telephone Encounter (Signed)
Lab in EPIC pt notified to come in next week for lab work

## 2016-01-08 ENCOUNTER — Other Ambulatory Visit (INDEPENDENT_AMBULATORY_CARE_PROVIDER_SITE_OTHER): Payer: Federal, State, Local not specified - PPO

## 2016-01-08 DIAGNOSIS — K639 Disease of intestine, unspecified: Secondary | ICD-10-CM | POA: Diagnosis not present

## 2016-01-08 DIAGNOSIS — K6389 Other specified diseases of intestine: Secondary | ICD-10-CM

## 2016-01-08 LAB — CBC WITH DIFFERENTIAL/PLATELET
BASOS ABS: 0 10*3/uL (ref 0.0–0.1)
BASOS PCT: 0.6 % (ref 0.0–3.0)
EOS ABS: 0.6 10*3/uL (ref 0.0–0.7)
Eosinophils Relative: 9.9 % — ABNORMAL HIGH (ref 0.0–5.0)
HEMATOCRIT: 30.7 % — AB (ref 36.0–46.0)
Hemoglobin: 10.1 g/dL — ABNORMAL LOW (ref 12.0–15.0)
LYMPHS ABS: 2.3 10*3/uL (ref 0.7–4.0)
Lymphocytes Relative: 38.8 % (ref 12.0–46.0)
MCHC: 32.7 g/dL (ref 30.0–36.0)
MCV: 76.6 fl — ABNORMAL LOW (ref 78.0–100.0)
Monocytes Absolute: 0.6 10*3/uL (ref 0.1–1.0)
Monocytes Relative: 10.5 % (ref 3.0–12.0)
NEUTROS ABS: 2.4 10*3/uL (ref 1.4–7.7)
NEUTROS PCT: 40.2 % — AB (ref 43.0–77.0)
PLATELETS: 282 10*3/uL (ref 150.0–400.0)
RBC: 4.01 Mil/uL (ref 3.87–5.11)
RDW: 14.3 % (ref 11.5–15.5)
WBC: 6 10*3/uL (ref 4.0–10.5)

## 2016-01-19 ENCOUNTER — Ambulatory Visit (AMBULATORY_SURGERY_CENTER): Payer: Federal, State, Local not specified - PPO | Admitting: Gastroenterology

## 2016-01-19 ENCOUNTER — Encounter: Payer: Self-pay | Admitting: Gastroenterology

## 2016-01-19 VITALS — BP 120/72 | HR 81 | Temp 97.5°F | Resp 17 | Ht 65.0 in | Wt 130.0 lb

## 2016-01-19 DIAGNOSIS — R933 Abnormal findings on diagnostic imaging of other parts of digestive tract: Secondary | ICD-10-CM

## 2016-01-19 DIAGNOSIS — D509 Iron deficiency anemia, unspecified: Secondary | ICD-10-CM

## 2016-01-19 DIAGNOSIS — C182 Malignant neoplasm of ascending colon: Secondary | ICD-10-CM

## 2016-01-19 MED ORDER — SODIUM CHLORIDE 0.9 % IV SOLN: 500.0000 mL | INTRAVENOUS | Status: DC

## 2016-01-19 NOTE — Op Note (Signed)
[facility] Patient Name: Brittney Spencer Procedure Date: 01/19/2016 2:15 PM MRN: [id number] Endoscopist: Mallie Mussel L. [name] , MD Age: 65 Referring MD:  Date of Birth: Oct 30, 1950 Gender: Female Account #: [id number] Procedure:                Colonoscopy Indications:              Unexplained iron deficiency anemia, Abnormal CT of                            the cecum Medicines:                Monitored Anesthesia Care Procedure:                Pre-Anesthesia Assessment:                           - Prior to the procedure, a History and Physical                            was performed, and patient medications and                            allergies were reviewed. The patient's tolerance of                            previous anesthesia was also reviewed. The risks                            and benefits of the procedure and the sedation                            options and risks were discussed with the patient.                            All questions were answered, and informed consent                            was obtained. Prior Anticoagulants: The patient has                            taken no previous anticoagulant or antiplatelet                            agents. ASA Grade Assessment: II - A patient with                            mild systemic disease. After reviewing the risks                            and benefits, the patient was deemed in                            satisfactory condition to undergo the procedure.  After obtaining informed consent, the colonoscope                            was passed under direct vision. Throughout the                            procedure, the patient's blood pressure, pulse, and                            oxygen saturations were monitored continuously. The                            Model PCF-H190DL [id number]) scope was introduced                            through the anus and advanced to the the  ascending                            colon, wher it could not be advanced further due to                            the pathology encountered. The colonoscopy was                            performed without difficulty. The patient tolerated                            the procedure well. The quality of the bowel                            preparation was fair. The bowel preparation used                            was SUPREP. Right colon anatomic landmarks could                            not be photographed; not reached due to obstructing                            mass. Scope In: Scope Out: Findings:                 The digital rectal exam findings include large                            internal hemorrhoids (Grade I), especially right                            posterior.                           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 The mass was circumferential. This                            was biopsied with a cold forceps for histology.                           Internal hemorrhoids were found during digital                            exam. The hemorrhoids were large and Grade I                            (internal hemorrhoids that do not prolapse).                           The exam was otherwise without abnormality on                            direct and retroflexion views. Complications:            No immediate complications. Estimated Blood Loss:     Estimated blood loss was minimal. Impression:               - Preparation of the colon was fair.                           - Internal hemorrhoids (Grade I) found on digital                            rectal exam.                           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 Biopsied.                           - Internal hemorrhoids.                           - The examination was otherwise normal on direct                            and retroflexion views. Recommendation:           - Patient has a contact number available for                            emergencies. The signs and symptoms of potential                            delayed complications were discussed with the  patient. Return to normal activities tomorrow.                            Written discharge instructions were provided to the                            patient.                           - Low fiber diet.                           - Continue present medications.                           - Await pathology results. Then surgical                            re-evaluation.                           - Repeat colonoscopy is recommended for                            surveillance. The colonoscopy date will be                            determined after pathology results from today's                            exam become available for review. Henry L. [name], MD 01/19/2016 5:22:26 PM This report has been signed electronically.

## 2016-01-19 NOTE — Patient Instructions (Signed)
YOU HAD AN ENDOSCOPIC PROCEDURE TODAY AT [facility]:   Refer to the procedure report that was given to you for any specific questions about what was found during the examination.  If the procedure report does not answer your questions, please call your gastroenterologist to clarify.  If you requested that your care partner not be given the details of your procedure findings, then the procedure report has been included in a sealed envelope for you to review at your convenience later.  YOU SHOULD EXPECT: Some feelings of bloating in the abdomen. Passage of more gas than usual.  Walking can help get rid of the air that was put into your GI tract during the procedure and reduce the bloating. If you had a lower endoscopy (such as a colonoscopy or flexible sigmoidoscopy) you may notice spotting of blood in your stool or on the toilet paper. If you underwent a bowel prep for your procedure, you may not have a normal bowel movement for a few days.  Please Note:  You might notice some irritation and congestion in your nose or some drainage.  This is from the oxygen used during your procedure.  There is no need for concern and it should clear up in a day or so.  SYMPTOMS TO REPORT IMMEDIATELY:   Following lower endoscopy (colonoscopy or flexible sigmoidoscopy):  Excessive amounts of blood in the stool  Significant tenderness or worsening of abdominal pains  Swelling of the abdomen that is new, acute  Fever of 100F or higher  Stay away from roughage.  Your nurse will give you a handouts.  Stay on the miralax to soften your stool.  For urgent or emergent issues, a gastroenterologist can be reached at any hour by calling [phone].   DIET: Your first meal following the procedure should be a small meal and then it is ok to progress to your normal diet. Heavy or fried foods are harder to digest and may make you feel nauseous or bloated.  Likewise, meals heavy in dairy and vegetables can  increase bloating.  Drink plenty of fluids but you should avoid alcoholic beverages for 24 hours.  ACTIVITY:  You should plan to take it easy for the rest of today and you should NOT DRIVE or use heavy machinery until tomorrow (because of the sedation medicines used during the test).    FOLLOW UP: Our staff will call the number listed on your records the next business day following your procedure to check on you and address any questions or concerns that you may have regarding the information given to you following your procedure. If we do not reach you, we will leave a message.  However, if you are feeling well and you are not experiencing any problems, there is no need to return our call.  We will assume that you have returned to your regular daily activities without incident.  If any biopsies were taken you will be contacted by phone or by letter within the next 1-3 weeks.  Please call us at [phone] if you have not heard about the biopsies in 3 weeks.    SIGNATURES/CONFIDENTIALITY: You and/or your care partner have signed paperwork which will be entered into your electronic medical record.  These signatures attest to the fact that that the information above on your After Visit Summary has been reviewed and is understood.  Full responsibility of the confidentiality of this discharge information lies with you and/or your care-partner.  Dr. [name] will  forward your report to the surgeon.  You will need surgery to remove the mass in your cecum.  The biopsies will be back, and Patty(Dr. [name]' CNA) will call you with the results.  The surgeon will also call you.

## 2016-01-19 NOTE — Progress Notes (Signed)
Procedure:   Colonoscopy with biopsy  Meds:   MAC  Indication:  Cecal mass, IDA  Quality of preparation:  fair  Findings:   Large, friable, malignant-appearing mass in mid ascending colon (arising from the cecum, based on CT).  obstructing  Incidentally, also large internal hemorrhoids, especially right posterior felt on rectal exam  Impression:  Malignant-appearing proximal colon mass Internal hemorrhoids Recommendations:  Bx pending. Low fiber diet and continue miralax Dr [name] will be alerted to findings and will need to see soon to discuss right hemicolectomy   Dorna Leitz GI Pager [phone]

## 2016-01-19 NOTE — Progress Notes (Signed)
Report to PACU, RN, vss, BBS= Clear.  

## 2016-01-19 NOTE — Progress Notes (Signed)
Called to room to assist during endoscopic procedure.  Patient ID and intended procedure confirmed with present staff. Received instructions for my participation in the procedure from the performing physician.  

## 2016-01-22 ENCOUNTER — Telehealth: Payer: Self-pay | Admitting: *Deleted

## 2016-01-22 NOTE — Telephone Encounter (Signed)
  Follow up Call-  Call back number 01/19/2016  Post procedure Call Back phone  # [phone]  Permission to leave phone message Yes  Some recent data might be hidden     Patient questions:  Do you have a fever, pain , or abdominal swelling? No. Pain Score  0 *  Have you tolerated food without any problems? Yes.    Have you been able to return to your normal activities? Yes.    Do you have any questions about your discharge instructions: Diet   No. Medications  No. Follow up visit  No.  Do you have questions or concerns about your Care? No.  Actions: * If pain score is 4 or above: No action needed, pain <4.

## 2016-01-24 ENCOUNTER — Telehealth: Payer: Self-pay

## 2016-01-24 NOTE — Telephone Encounter (Signed)
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  The findings will be released in EPIC shortly.  Appt confirmed with Dr Abran Cantor for 01/26/16 at 9:45 am.  Per Dr [name] send to Doc of the Day to review.  Consulted with Dr Carlean Purl and no changes in plans.  Left message on machine to call back

## 2016-01-24 NOTE — Telephone Encounter (Signed)
The pt was called and given the results of the pathology, Brittney Spencer also confirmed the appt with Dr [name] for 01/26/16.  I will also forward to Dr [name] for review.

## 2016-01-26 ENCOUNTER — Other Ambulatory Visit: Payer: Self-pay | Admitting: General Surgery

## 2016-01-29 NOTE — Telephone Encounter (Signed)
Recall in EPIC

## 2016-01-29 NOTE — Telephone Encounter (Signed)
Patty,    Please put this patient in for a one year recall for colonoscopy.

## 2016-01-30 ENCOUNTER — Encounter: Payer: Self-pay | Admitting: Gastroenterology

## 2016-02-01 NOTE — Patient Instructions (Addendum)
Brittney Spencer  02/01/2016   Your procedure is scheduled on: 02-07-16  Report to [facility] Main  Entrance take [facility]  elevators to 3rd floor to  [place] at 1030 AM.  Call this number if you have problems the morning of surgery [phone]   Remember: ONLY 1 PERSON MAY GO WITH YOU TO SHORT STAY TO GET  READY MORNING OF [place].  Do not eat food :After Midnight, Monday night clear liquids all day Tuesday, Follow all bowel prep instructions from dr [name].No clear liquids after midnight Tuesday night. MAKE SURE DRINK PLENTY OF WATER DURING BOWEL PREP.     Take these medicines the morning of surgery with A SIP OF WATER: NORVASC, XANAX IF NEEDED                               You may not have any metal on your body including hair pins and              piercings  Do not wear jewelry, make-up, lotions, powders or perfumes, deodorant             Do not wear nail polish.  Do not shave  48 hours prior to surgery.              Men may shave face and neck.   Do not bring valuables to the hospital. [place].  Contacts, dentures or bridgework may not be worn into surgery.  Leave suitcase in the car. After surgery it may be brought to your room.                   Please read over the following fact sheets you were given: _____________________________________________________________________                CLEAR LIQUID DIET   Foods Allowed                                                                     Foods Excluded  Coffee and tea, regular and decaf                             liquids that you cannot  Plain Jell-O in any flavor                                             see through such as: Fruit ices (not with fruit pulp)                                     milk, soups, orange juice  Iced Popsicles  All solid food Carbonated beverages, regular and diet                                     Cranberry, grape and apple juices Sports drinks like Gatorade Lightly seasoned clear broth or consume(fat free) Sugar, honey syrup  Sample Menu Breakfast                                Lunch                                     Supper Cranberry juice                    Beef broth                            Chicken broth Jell-O                                     Grape juice                           Apple juice Coffee or tea                        Jell-O                                      Popsicle                                                Coffee or tea                        Coffee or tea  _____________________________________________________________________  [facility] - Preparing for Surgery Before surgery, you can play an important role.  Because skin is not sterile, your skin needs to be as free of germs as possible.  You can reduce the number of germs on your skin by washing with CHG (chlorahexidine gluconate) soap before surgery.  CHG is an antiseptic cleaner which kills germs and bonds with the skin to continue killing germs even after washing. Please DO NOT use if you have an allergy to CHG or antibacterial soaps.  If your skin becomes reddened/irritated stop using the CHG and inform your nurse when you arrive at Short Stay. Do not shave (including legs and underarms) for at least 48 hours prior to the first CHG shower.  You may shave your face/neck. Please follow these instructions carefully:  1.  Shower with CHG Soap the night before surgery and the  morning of Surgery.  2.  If you choose to wash your hair, wash your hair first as usual with your  normal  shampoo.  3.  After you shampoo, rinse your hair and body thoroughly to remove the  shampoo.  4.  Use CHG as you would any other liquid soap.  You can apply chg directly  to the skin and wash                       Gently with a scrungie or clean washcloth.  5.  Apply the  CHG Soap to your body ONLY FROM THE NECK DOWN.   Do not use on face/ open                           Wound or open sores. Avoid contact with eyes, ears mouth and genitals (private parts).                       Wash face,  Genitals (private parts) with your normal soap.             6.  Wash thoroughly, paying special attention to the area where your surgery  will be performed.  7.  Thoroughly rinse your body with warm water from the neck down.  8.  DO NOT shower/wash with your normal soap after using and rinsing off  the CHG Soap.                9.  Pat yourself dry with a clean towel.            10.  Wear clean pajamas.            11.  Place clean sheets on your bed the night of your first shower and do not  sleep with pets. Day of Surgery : Do not apply any lotions/deodorants the morning of surgery.  Please wear clean clothes to the hospital/surgery center.  FAILURE TO FOLLOW THESE INSTRUCTIONS MAY RESULT IN THE CANCELLATION OF YOUR SURGERY PATIENT SIGNATURE_________________________________  NURSE SIGNATURE__________________________________  ________________________________________________________________________

## 2016-02-05 ENCOUNTER — Encounter (HOSPITAL_COMMUNITY)
Admission: RE | Admit: 2016-02-05 | Discharge: 2016-02-05 | Disposition: A | Discharge status: Home / Self Care | Payer: Medicare Other | Source: Ambulatory Visit | Attending: General Surgery | Admitting: General Surgery

## 2016-02-05 ENCOUNTER — Encounter (INDEPENDENT_AMBULATORY_CARE_PROVIDER_SITE_OTHER): Payer: Self-pay

## 2016-02-05 ENCOUNTER — Encounter (HOSPITAL_COMMUNITY): Payer: Self-pay

## 2016-02-05 HISTORY — DX: Abdominal aortic aneurysm, without rupture: I71.4

## 2016-02-05 HISTORY — DX: Abdominal aortic aneurysm, without rupture, unspecified: I71.40

## 2016-02-05 HISTORY — DX: Vitamin D deficiency, unspecified: E55.9

## 2016-02-05 HISTORY — DX: Anemia, unspecified: D64.9

## 2016-02-05 LAB — CBC
HEMATOCRIT: 32 % — AB (ref 36.0–46.0)
HEMOGLOBIN: 10.1 g/dL — AB (ref 12.0–15.0)
MCH: 23.8 pg — AB (ref 26.0–34.0)
MCHC: 31.6 g/dL (ref 30.0–36.0)
MCV: 75.5 fL — ABNORMAL LOW (ref 78.0–100.0)
Platelets: 326 10*3/uL (ref 150–400)
RBC: 4.24 MIL/uL (ref 3.87–5.11)
RDW: 14.6 % (ref 11.5–15.5)
WBC: 4.4 10*3/uL (ref 4.0–10.5)

## 2016-02-05 LAB — BASIC METABOLIC PANEL
Anion gap: 10 (ref 5–15)
BUN: 12 mg/dL (ref 6–20)
CALCIUM: 9.3 mg/dL (ref 8.9–10.3)
CHLORIDE: 98 mmol/L — AB (ref 101–111)
CO2: 26 mmol/L (ref 22–32)
Creatinine, Ser: 0.82 mg/dL (ref 0.44–1.00)
GFR calc non Af Amer: >60 mL/min (ref >60)
Glucose, Bld: 103 mg/dL — ABNORMAL HIGH (ref 65–99)
POTASSIUM: 2.7 mmol/L — AB (ref 3.5–5.1)
SODIUM: 134 mmol/L — AB (ref 135–145)

## 2016-02-05 NOTE — Progress Notes (Signed)
SPOKE [place] RN, MD ONCALL CALLED IN POTASSIUM FOR PATIENT AND SHE WILL CHECK WITH DR [facility] 02-06-16 AND SEE IF HE WANTS POTASSIUM RECHECK DAY OF SURGERY 02-07-16.

## 2016-02-05 NOTE — Progress Notes (Addendum)
REPEATED EKG WITH PRE OP 02-05-16 DUE TO ST WITH Cato Mulligan MARCH 2017 lov dr Bunnie Philips vascular 12-06-15 on chart aaa scan 11-07-15 dr Bunnie Philips on chart Neuro clerance dr kraft 01-18-16 on chart Medical clearance dr Sherrie Sport on chart

## 2016-02-05 NOTE — Progress Notes (Signed)
CRITICAL VALUE ALERT  Critical value received: POTASSIUM 2.7  Date of notification:  02-05-16  Time of notification:  1205 PM  Critical value read back: YES  Nurse who received alert:  Zelphia Cairo RN  MD notified (1st page):  DR [name]  Time of first page:  52  MD notified (2nd page):  Time of second page:  Responding MD:   Time MD responded:  SPOKE WITH CRYSTAL Arco RN AT DR [place] 2.7 CRITICIAL AT PRE OP [place], CRYSTAL DOLLARD RN TO MAKE DR [place].

## 2016-02-06 ENCOUNTER — Other Ambulatory Visit (HOSPITAL_COMMUNITY): Payer: Self-pay | Admitting: *Deleted

## 2016-02-06 ENCOUNTER — Other Ambulatory Visit: Payer: Self-pay | Admitting: General Surgery

## 2016-02-06 LAB — CEA: CEA: 5.7 ng/mL — ABNORMAL HIGH (ref 0.0–4.7)

## 2016-02-07 ENCOUNTER — Encounter (HOSPITAL_COMMUNITY)
Admission: RE | Disposition: A | Discharge status: Home / Self Care | Payer: Self-pay | Source: Ambulatory Visit | Attending: General Surgery

## 2016-02-07 ENCOUNTER — Encounter (HOSPITAL_COMMUNITY): Payer: Self-pay | Admitting: *Deleted

## 2016-02-07 ENCOUNTER — Inpatient Hospital Stay (HOSPITAL_COMMUNITY)
Admission: RE | Admit: 2016-02-07 | Discharge: 2016-02-12 | DRG: 330 | Disposition: A | Discharge status: Home / Self Care | Payer: Medicare Other | Source: Ambulatory Visit | Attending: General Surgery | Admitting: General Surgery

## 2016-02-07 ENCOUNTER — Inpatient Hospital Stay (HOSPITAL_COMMUNITY): Payer: Medicare Other | Admitting: Anesthesiology

## 2016-02-07 DIAGNOSIS — C180 Malignant neoplasm of cecum: Secondary | ICD-10-CM | POA: Diagnosis present

## 2016-02-07 DIAGNOSIS — C182 Malignant neoplasm of ascending colon: Secondary | ICD-10-CM | POA: Diagnosis present

## 2016-02-07 DIAGNOSIS — I714 Abdominal aortic aneurysm, without rupture: Secondary | ICD-10-CM | POA: Diagnosis present

## 2016-02-07 DIAGNOSIS — D62 Acute posthemorrhagic anemia: Secondary | ICD-10-CM | POA: Diagnosis not present

## 2016-02-07 DIAGNOSIS — C772 Secondary and unspecified malignant neoplasm of intra-abdominal lymph nodes: Secondary | ICD-10-CM | POA: Diagnosis present

## 2016-02-07 DIAGNOSIS — C19 Malignant neoplasm of rectosigmoid junction: Secondary | ICD-10-CM | POA: Diagnosis present

## 2016-02-07 DIAGNOSIS — I10 Essential (primary) hypertension: Secondary | ICD-10-CM | POA: Diagnosis present

## 2016-02-07 HISTORY — PX: LAPAROSCOPIC RIGHT HEMI COLECTOMY: SHX5926

## 2016-02-07 LAB — HEMOGLOBIN AND HEMATOCRIT, BLOOD
HEMATOCRIT: 30.5 % — AB (ref 36.0–46.0)
HEMOGLOBIN: 9.6 g/dL — AB (ref 12.0–15.0)

## 2016-02-07 LAB — ABO/RH: ABO/RH(D): O POS

## 2016-02-07 LAB — TYPE AND SCREEN
ABO/RH(D): O POS
ANTIBODY SCREEN: NEGATIVE

## 2016-02-07 LAB — POTASSIUM: Potassium: 4.6 mmol/L (ref 3.5–5.1)

## 2016-02-07 SURGERY — LAPAROSCOPIC RIGHT HEMI COLECTOMY
Anesthesia: General | Site: Abdomen | Laterality: Right

## 2016-02-07 MED ORDER — ROCURONIUM BROMIDE 100 MG/10ML IV SOLN
INTRAVENOUS | Status: DC | PRN
  Administered 2016-02-07: 10 mg via INTRAVENOUS
  Administered 2016-02-07: 5 mg via INTRAVENOUS
  Administered 2016-02-07: 40 mg via INTRAVENOUS

## 2016-02-07 MED ORDER — METOPROLOL TARTRATE 5 MG/5ML IV SOLN
INTRAVENOUS | Status: DC | PRN
  Administered 2016-02-07 (×2): .5 mg via INTRAVENOUS

## 2016-02-07 MED ORDER — ONDANSETRON HCL 4 MG/2ML IJ SOLN
INTRAMUSCULAR | Status: AC
  Filled 2016-02-07: qty 2

## 2016-02-07 MED ORDER — KCL IN DEXTROSE-NACL 20-5-0.9 MEQ/L-%-% IV SOLN
INTRAVENOUS | Status: DC
  Administered 2016-02-07 – 2016-02-08 (×2): via INTRAVENOUS
  Filled 2016-02-07 (×2): qty 1000

## 2016-02-07 MED ORDER — CETYLPYRIDINIUM CHLORIDE 0.05 % MT LIQD
7.0000 mL | Freq: Two times a day (BID) | OROMUCOSAL | Status: DC
  Administered 2016-02-11: 7 mL via OROMUCOSAL

## 2016-02-07 MED ORDER — SUCCINYLCHOLINE CHLORIDE 20 MG/ML IJ SOLN
INTRAMUSCULAR | Status: DC | PRN
  Administered 2016-02-07: 100 mg via INTRAVENOUS

## 2016-02-07 MED ORDER — FENTANYL CITRATE (PF) 100 MCG/2ML IJ SOLN
INTRAMUSCULAR | Status: DC | PRN
  Administered 2016-02-07: 25 ug via INTRAVENOUS
  Administered 2016-02-07 (×2): 50 ug via INTRAVENOUS
  Administered 2016-02-07 (×2): 25 ug via INTRAVENOUS
  Administered 2016-02-07: 50 ug via INTRAVENOUS
  Administered 2016-02-07 (×3): 25 ug via INTRAVENOUS
  Administered 2016-02-07 (×3): 50 ug via INTRAVENOUS

## 2016-02-07 MED ORDER — PROPOFOL 10 MG/ML IV BOLUS
INTRAVENOUS | Status: AC
  Filled 2016-02-07: qty 20

## 2016-02-07 MED ORDER — PHENYLEPHRINE 40 MCG/ML (10ML) SYRINGE FOR IV PUSH (FOR BLOOD PRESSURE SUPPORT)
PREFILLED_SYRINGE | INTRAVENOUS | Status: AC
  Filled 2016-02-07: qty 20

## 2016-02-07 MED ORDER — DEXAMETHASONE SODIUM PHOSPHATE 10 MG/ML IJ SOLN
INTRAMUSCULAR | Status: AC
  Filled 2016-02-07: qty 1

## 2016-02-07 MED ORDER — ROCURONIUM BROMIDE 100 MG/10ML IV SOLN
INTRAVENOUS | Status: AC
  Filled 2016-02-07: qty 1

## 2016-02-07 MED ORDER — NEOMYCIN SULFATE 500 MG PO TABS
1000.0000 mg | ORAL_TABLET | ORAL | Status: DC
  Filled 2016-02-07: qty 2

## 2016-02-07 MED ORDER — LACTATED RINGERS IR SOLN
Status: DC | PRN
  Administered 2016-02-07: 1000 mL

## 2016-02-07 MED ORDER — ALPRAZOLAM 0.25 MG PO TABS
0.2500 mg | ORAL_TABLET | Freq: Every day | ORAL | Status: DC | PRN
  Administered 2016-02-08 – 2016-02-09 (×2): 0.25 mg via ORAL
  Filled 2016-02-07 (×2): qty 1

## 2016-02-07 MED ORDER — HYDROMORPHONE HCL 1 MG/ML IJ SOLN
INTRAMUSCULAR | Status: DC | PRN
  Administered 2016-02-07 (×3): .2 mg via INTRAVENOUS

## 2016-02-07 MED ORDER — LACTATED RINGERS IV SOLN: INTRAVENOUS | Status: DC

## 2016-02-07 MED ORDER — LACTATED RINGERS IV SOLN
INTRAVENOUS | Status: DC
  Administered 2016-02-07 (×4): via INTRAVENOUS

## 2016-02-07 MED ORDER — POLYETHYLENE GLYCOL 3350 17 GM/SCOOP PO POWD
1.0000 | Freq: Once | ORAL | Status: DC
  Filled 2016-02-07: qty 255

## 2016-02-07 MED ORDER — MORPHINE SULFATE (PF) 2 MG/ML IV SOLN
INTRAVENOUS | Status: AC
  Filled 2016-02-07: qty 1

## 2016-02-07 MED ORDER — SODIUM CHLORIDE 0.9 % IJ SOLN
INTRAMUSCULAR | Status: AC
  Filled 2016-02-07: qty 50

## 2016-02-07 MED ORDER — CEFOTETAN DISODIUM-DEXTROSE 2-2.08 GM-% IV SOLR
INTRAVENOUS | Status: AC
  Filled 2016-02-07: qty 50

## 2016-02-07 MED ORDER — ONDANSETRON HCL 4 MG/2ML IJ SOLN
INTRAMUSCULAR | Status: DC | PRN
  Administered 2016-02-07: 4 mg via INTRAVENOUS

## 2016-02-07 MED ORDER — MIDAZOLAM HCL 5 MG/5ML IJ SOLN
INTRAMUSCULAR | Status: DC | PRN
Start: 2016-02-07 — End: 2016-02-07
  Administered 2016-02-07: 2 mg via INTRAVENOUS

## 2016-02-07 MED ORDER — BUPIVACAINE-EPINEPHRINE (PF) 0.25% -1:200000 IJ SOLN
INTRAMUSCULAR | Status: DC | PRN
  Administered 2016-02-07: 15 mL

## 2016-02-07 MED ORDER — TRIAMTERENE-HCTZ 37.5-25 MG PO TABS
0.5000 | ORAL_TABLET | Freq: Every day | ORAL | Status: DC
  Administered 2016-02-08 – 2016-02-12 (×5): 0.5 via ORAL
  Filled 2016-02-07 (×6): qty 0.5

## 2016-02-07 MED ORDER — ONDANSETRON HCL 4 MG/2ML IJ SOLN
4.0000 mg | Freq: Four times a day (QID) | INTRAMUSCULAR | Status: DC | PRN
  Administered 2016-02-09: 4 mg via INTRAVENOUS
  Filled 2016-02-07: qty 2

## 2016-02-07 MED ORDER — FENTANYL CITRATE (PF) 250 MCG/5ML IJ SOLN
INTRAMUSCULAR | Status: AC
  Filled 2016-02-07: qty 5

## 2016-02-07 MED ORDER — SODIUM CHLORIDE 0.9 % IJ SOLN
INTRAMUSCULAR | Status: DC | PRN
  Administered 2016-02-07: 20 mL

## 2016-02-07 MED ORDER — SUGAMMADEX SODIUM 200 MG/2ML IV SOLN
INTRAVENOUS | Status: DC | PRN
  Administered 2016-02-07: 200 mg via INTRAVENOUS

## 2016-02-07 MED ORDER — BUPIVACAINE-EPINEPHRINE (PF) 0.25% -1:200000 IJ SOLN
INTRAMUSCULAR | Status: AC
  Filled 2016-02-07: qty 30

## 2016-02-07 MED ORDER — AMLODIPINE BESYLATE 5 MG PO TABS
2.5000 mg | ORAL_TABLET | Freq: Every day | ORAL | Status: DC
  Administered 2016-02-08 – 2016-02-12 (×5): 2.5 mg via ORAL
  Filled 2016-02-07 (×5): qty 1

## 2016-02-07 MED ORDER — ENOXAPARIN SODIUM 40 MG/0.4ML ~~LOC~~ SOLN
40.0000 mg | SUBCUTANEOUS | Status: DC
  Administered 2016-02-08 – 2016-02-12 (×5): 40 mg via SUBCUTANEOUS
  Filled 2016-02-07 (×6): qty 0.4

## 2016-02-07 MED ORDER — METRONIDAZOLE 500 MG PO TABS
1000.0000 mg | ORAL_TABLET | ORAL | Status: DC
  Filled 2016-02-07: qty 2

## 2016-02-07 MED ORDER — ONDANSETRON HCL 4 MG PO TABS: 4.0000 mg | ORAL_TABLET | Freq: Four times a day (QID) | ORAL | Status: DC | PRN

## 2016-02-07 MED ORDER — DEXAMETHASONE SODIUM PHOSPHATE 10 MG/ML IJ SOLN
INTRAMUSCULAR | Status: DC | PRN
  Administered 2016-02-07: 10 mg via INTRAVENOUS

## 2016-02-07 MED ORDER — HYDROMORPHONE HCL 1 MG/ML IJ SOLN: 0.2500 mg | INTRAMUSCULAR | Status: DC | PRN

## 2016-02-07 MED ORDER — LIDOCAINE HCL (CARDIAC) 20 MG/ML IV SOLN
INTRAVENOUS | Status: AC
  Filled 2016-02-07: qty 5

## 2016-02-07 MED ORDER — CEFOTETAN DISODIUM 2 G IJ SOLR
2.0000 g | INTRAMUSCULAR | Status: AC
  Administered 2016-02-07: 2 g via INTRAVENOUS
  Filled 2016-02-07: qty 2

## 2016-02-07 MED ORDER — HYDROMORPHONE HCL 2 MG/ML IJ SOLN
INTRAMUSCULAR | Status: AC
  Filled 2016-02-07: qty 1

## 2016-02-07 MED ORDER — ALVIMOPAN 12 MG PO CAPS
12.0000 mg | ORAL_CAPSULE | Freq: Two times a day (BID) | ORAL | Status: DC
  Administered 2016-02-08 – 2016-02-11 (×8): 12 mg via ORAL
  Filled 2016-02-07 (×9): qty 1

## 2016-02-07 MED ORDER — PROPOFOL 10 MG/ML IV BOLUS
INTRAVENOUS | Status: DC | PRN
  Administered 2016-02-07: 130 mg via INTRAVENOUS

## 2016-02-07 MED ORDER — MORPHINE SULFATE (PF) 2 MG/ML IV SOLN
2.0000 mg | INTRAVENOUS | Status: DC | PRN
  Administered 2016-02-07 – 2016-02-08 (×3): 2 mg via INTRAVENOUS
  Filled 2016-02-07 (×3): qty 1

## 2016-02-07 MED ORDER — METOPROLOL TARTRATE 5 MG/5ML IV SOLN
INTRAVENOUS | Status: AC
  Filled 2016-02-07: qty 5

## 2016-02-07 MED ORDER — PHENYLEPHRINE HCL 10 MG/ML IJ SOLN
INTRAMUSCULAR | Status: DC | PRN
  Administered 2016-02-07: 50 ug/min via INTRAVENOUS

## 2016-02-07 MED ORDER — OXYCODONE-ACETAMINOPHEN 5-325 MG PO TABS
1.0000 | ORAL_TABLET | ORAL | Status: DC | PRN
  Administered 2016-02-08: 1 via ORAL
  Filled 2016-02-07 (×2): qty 1

## 2016-02-07 MED ORDER — SODIUM CHLORIDE 0.9 % IJ SOLN
INTRAMUSCULAR | Status: AC
  Filled 2016-02-07: qty 10

## 2016-02-07 MED ORDER — MIDAZOLAM HCL 2 MG/2ML IJ SOLN
INTRAMUSCULAR | Status: AC
  Filled 2016-02-07: qty 2

## 2016-02-07 MED ORDER — ALVIMOPAN 12 MG PO CAPS
12.0000 mg | ORAL_CAPSULE | Freq: Once | ORAL | Status: AC
  Administered 2016-02-07: 12 mg via ORAL
  Filled 2016-02-07: qty 1

## 2016-02-07 MED ORDER — PHENYLEPHRINE HCL 10 MG/ML IJ SOLN
INTRAMUSCULAR | Status: DC | PRN
  Administered 2016-02-07: 120 ug via INTRAVENOUS
  Administered 2016-02-07 (×5): 80 ug via INTRAVENOUS

## 2016-02-07 MED ORDER — CHLORHEXIDINE GLUCONATE 0.12 % MT SOLN
15.0000 mL | Freq: Two times a day (BID) | OROMUCOSAL | Status: DC
  Administered 2016-02-08 – 2016-02-11 (×5): 15 mL via OROMUCOSAL
  Filled 2016-02-07 (×6): qty 15

## 2016-02-07 MED ORDER — LIDOCAINE HCL (CARDIAC) 20 MG/ML IV SOLN
INTRAVENOUS | Status: DC | PRN
  Administered 2016-02-07: 50 mg via INTRAVENOUS

## 2016-02-07 MED ORDER — BUPIVACAINE LIPOSOME 1.3 % IJ SUSP
20.0000 mL | Freq: Once | INTRAMUSCULAR | Status: AC
  Administered 2016-02-07: 20 mL
  Filled 2016-02-07: qty 20

## 2016-02-07 SURGICAL SUPPLY — 77 items
APPLIER CLIP 5 13 M/L LIGAMAX5 (MISCELLANEOUS)
APPLIER CLIP ROT 10 11.4 M/L (STAPLE)
APR CLP MED LRG 11.4X10 (STAPLE)
APR CLP MED LRG 5 ANG JAW (MISCELLANEOUS)
BLADE EXTENDED COATED 6.5IN (ELECTRODE) ×2 IMPLANT
BLADE HEX COATED 2.75 (ELECTRODE) ×2 IMPLANT
CABLE HIGH FREQUENCY MONO STRZ (ELECTRODE) ×2 IMPLANT
CELLS DAT CNTRL 66122 CELL SVR (MISCELLANEOUS) IMPLANT
CLIP APPLIE 5 13 M/L LIGAMAX5 (MISCELLANEOUS) IMPLANT
CLIP APPLIE ROT 10 11.4 M/L (STAPLE) ×1 IMPLANT
CLIP TI MEDIUM 6 (CLIP) ×1 IMPLANT
COVER SURGICAL LIGHT HANDLE (MISCELLANEOUS) ×3 IMPLANT
DECANTER SPIKE VIAL GLASS SM (MISCELLANEOUS) ×2 IMPLANT
DRAIN CHANNEL 19F RND (DRAIN) ×2 IMPLANT
DRAPE LAPAROSCOPIC ABDOMINAL (DRAPES) ×2 IMPLANT
DRSG OPSITE POSTOP 4X12 (GAUZE/BANDAGES/DRESSINGS) ×1 IMPLANT
ELECT REM PT RETURN 9FT ADLT (ELECTROSURGICAL) ×2
ELECTRODE REM PT RTRN 9FT ADLT (ELECTROSURGICAL) ×1 IMPLANT
FILTER SMOKE EVAC LAPAROSHD (FILTER) IMPLANT
GAUZE SPONGE 4X4 12PLY STRL (GAUZE/BANDAGES/DRESSINGS) ×2 IMPLANT
GAUZE SPONGE 4X4 16PLY XRAY LF (GAUZE/BANDAGES/DRESSINGS) ×1 IMPLANT
GLOVE BIOGEL PI IND STRL 7.0 (GLOVE) ×1 IMPLANT
GLOVE BIOGEL PI INDICATOR 7.0 (GLOVE) ×1
GLOVE ECLIPSE 8.0 STRL XLNG CF (GLOVE) ×4 IMPLANT
GLOVE INDICATOR 8.0 STRL GRN (GLOVE) ×2 IMPLANT
GOWN STRL REUS W/TWL LRG LVL3 (GOWN DISPOSABLE) ×2 IMPLANT
GOWN STRL REUS W/TWL XL LVL3 (GOWN DISPOSABLE) ×4 IMPLANT
HANDLE SUCTION POOLE (INSTRUMENTS) ×1 IMPLANT
HEMOSTAT SNOW SURGICEL 2X4 (HEMOSTASIS) ×2 IMPLANT
IRRIG SUCT STRYKERFLOW 2 WTIP (MISCELLANEOUS) ×2
IRRIGATION SUCT STRKRFLW 2 WTP (MISCELLANEOUS) ×1 IMPLANT
LIGASURE IMPACT 36 18CM CVD LR (INSTRUMENTS) IMPLANT
NS IRRIG 1000ML POUR BTL (IV SOLUTION) ×6 IMPLANT
PACK COLON (CUSTOM PROCEDURE TRAY) ×2 IMPLANT
PACK GENERAL/GYN (CUSTOM PROCEDURE TRAY) ×2 IMPLANT
PAD POSITIONER PINK NONSTERILE (MISCELLANEOUS) ×1 IMPLANT
PAD POSITIONING PINK XL (MISCELLANEOUS) IMPLANT
POSITIONER SURGICAL ARM (MISCELLANEOUS) IMPLANT
RELOAD PROXIMATE 75MM BLUE (ENDOMECHANICALS) ×4 IMPLANT
RELOAD STAPLE 75 3.8 BLU REG (ENDOMECHANICALS) IMPLANT
RETRACTOR WND ALEXIS 18 MED (MISCELLANEOUS) IMPLANT
RETRACTOR WND ALEXIS 25 LRG (MISCELLANEOUS) IMPLANT
RTRCTR WOUND ALEXIS 18CM MED (MISCELLANEOUS)
RTRCTR WOUND ALEXIS 25CM LRG (MISCELLANEOUS) ×2
SCISSORS LAP 5X35 DISP (ENDOMECHANICALS) ×2 IMPLANT
SHEARS HARMONIC ACE PLUS 36CM (ENDOMECHANICALS) IMPLANT
SHEARS HARMONIC HDI 36CM (ELECTROSURGICAL) ×1 IMPLANT
SOLUTION ANTI FOG 6CC (MISCELLANEOUS) ×2 IMPLANT
SPONGE LAP 18X18 X RAY DECT (DISPOSABLE) ×4 IMPLANT
STAPLER PROXIMATE 75MM BLUE (STAPLE) ×1 IMPLANT
STAPLER VISISTAT 35W (STAPLE) ×1 IMPLANT
SUCTION POOLE HANDLE (INSTRUMENTS) ×2
SUT PDS AB 1 CTX 36 (SUTURE) ×4 IMPLANT
SUT PDS AB 1 TP1 96 (SUTURE) ×2 IMPLANT
SUT PROLENE 2 0 KS (SUTURE) IMPLANT
SUT SILK 2 0 (SUTURE) ×2
SUT SILK 2 0 SH CR/8 (SUTURE) ×2 IMPLANT
SUT SILK 2-0 18XBRD TIE 12 (SUTURE) ×1 IMPLANT
SUT SILK 3 0 (SUTURE) ×2
SUT SILK 3 0 SH CR/8 (SUTURE) ×2 IMPLANT
SUT SILK 3-0 18XBRD TIE 12 (SUTURE) ×1 IMPLANT
SUT VIC AB 2-0 SH 18 (SUTURE) ×5 IMPLANT
SUT VICRYL 0 UR6 27IN ABS (SUTURE) ×1 IMPLANT
SUT VICRYL 2 0 18  UND BR (SUTURE) ×2
SUT VICRYL 2 0 18 UND BR (SUTURE) ×2 IMPLANT
SYS LAPSCP GELPORT 120MM (MISCELLANEOUS)
SYSTEM LAPSCP GELPORT 120MM (MISCELLANEOUS) IMPLANT
TAPE CLOTH 4X10 WHT NS (GAUZE/BANDAGES/DRESSINGS) IMPLANT
TOWEL OR 17X26 10 PK STRL BLUE (TOWEL DISPOSABLE) ×4 IMPLANT
TRAY FOLEY W/METER SILVER 14FR (SET/KITS/TRAYS/PACK) ×2 IMPLANT
TRAY FOLEY W/METER SILVER 16FR (SET/KITS/TRAYS/PACK) ×1 IMPLANT
TROCAR BLADELESS OPT 5 100 (ENDOMECHANICALS) ×5 IMPLANT
TROCAR HASSON GELL 12X100 (TROCAR) ×1 IMPLANT
TROCAR XCEL NON-BLD 11X100MML (ENDOMECHANICALS) ×1 IMPLANT
TROCAR XCEL UNIV SLVE 11M 100M (ENDOMECHANICALS) IMPLANT
TUBING INSUF HEATED (TUBING) ×4 IMPLANT
YANKAUER SUCT BULB TIP NO VENT (SUCTIONS) ×2 IMPLANT

## 2016-02-07 NOTE — Op Note (Signed)
Preoperative Diagnosis: cancer right colon  Postoprative Diagnosis: cancer right colon  Procedure: Procedure(s): LAPAROSCOPIC ASSISTED RIGHT HEMI COLECTOMY AND RIGHT SALPINGO OOPHERECTOMY   Surgeon: [name] T   Assistants: Alphonsa Overall  Anesthesia:  General endotracheal anesthesia  Indications: patient is a 65 year old female who in approximately February of this year presented with right lower quadrant pain and imaging consistent with perforated appendicitis. She was treated nonoperatively and improved. Follow-up CT scan shortly thereafter questioned thickening of the cecum consistent with neoplasm. She was scheduled for colonoscopy which she canceled on a couple of occasions for personal reasons. Subsequently this was performed within the last couple of weeks showing a large partially obstructing neoplasm of the cecum and biopsy confirmed adenocarcinoma. I recommend proceeding with laparoscopic and possible open right colectomy. We've previously discussed the procedure and indications, recovery and risks detailedelsewhere    Procedure Detail:  She had undergone a mechanical and antibiotic bowel prep at home. She was taken to the operating room, placed in the supine position on the operating table and general endotracheal anesthesia induced. She is carefully padded with arms tucked. Foley catheter was placed. The abdomen was widely sterilely prepped and draped. She received preoperative IV antibiotic. Patient timeout was performed and correct procedure verified. Laparoscopic access was obtained with a 1 cm open Hassan technique just above the umbilicus in the midline. Pneumoperitoneum established. I placed 25 mm trochars in the left lower quadrant and left mid abdomen.  There was noted to be a large quite fixed mass in the cecum with adherence to the retroperitoneum  And lateral pelvic sidewall. The terminal ileum was also adherent to this area.  I decided quickly that  Open excision  would be necessary. However in order to minimize the incision I proceeded with mobilization of the transverse and  Distal right colon laparoscopically. The gastrohepatic omentum was dissected with Harmonic scalpel and the lesser sac was entered. I then continued along the gastric colic omentumproximally mobilizing the proximal  Transverse colon. The  Hepatocolic ligament was completely taken down under direct vision and the hepatic flexure mobilized away from the duodenum which was carefully identified and protected and off of introitus fascia. At this point continuing along the lateral peritoneal attachment of the right colonthis became fixed and I proceeded with an open incision. A low midline incision was used from just above the umbilicus  To about 5 cm above the pubis.  Wound protector was placed. Exploration revealed a somewhat bulky mass in the cecum with fixation to the  Lateral peritoneum.  There was  One large area of palpable adenopathy in the  Ileocolic  Pedicle which was freed from the  Superior mesenteric pedicle.  The distal right colon was further mobilized down toward the area of adherence. I divided the terminal ileum 6 or 7 cm from the cecum. The mesentery of the terminal ileum to be removed was divided with Harmonic scalpel working down toward the cecum.I dissected behind the mesentery of the right colon in an effort to define the path of the ureter. I initially could not find it proximally and then went inferior to the tumor in the pelvis dividing the peritoneum and located the ureter  Inferior to the tumor. It was then traced proximally underneath the medial aspect of the cecum and mesentery of the  Cecum and terminal ileum  And traced proximally and was completely clear of the tumor and was medial of the dissection plane. With the ureter in view could then begin to  come across the posterior attachments of the tumor in the retroperitoneum and laterally take peritoneum off the pelvic side wall  mobilizing the  Proximal right colon and cecum. This was taken back toward the psoas muscle. The gonadal vessels were divided as they were adherent to the posterior aspect of the tumor. The  Ileocecal valve was mobilized up out of the retroperitoneum lateral to the ureter and the appendix which was located  In a retrocecal position was dissected up as well. At this point I came across what appeared to be a chronic abscess cavity or perforation with firm tissue lateral in te pelvis consistent with possible tumor or fibrosis.  I dissected the ileocolic vessels at their origin off the SMA and they were doubly clamped proximally, clamped distally divided and tied with 2-0 silk. The mesentery was further dissected up out of the retroperitoneum again protecting the ureter until there were just some posterior lateral retroperitoneal attachments to the mass. A point of the mid transverse colon was chosen  With good blood supply from distal branches of the middle colic and this was divided with the GIA 75 mm stapler. The omentum was divided at this point as well.  Some further mesentery of the transverse colon was divided at this point specimen was completely free down to retroperitoneal attachments. I came across these posteriorly and laterally again protecting the ureter and the specimen was removed. Examination of the retroperitoneum showed that the external iliac and ureter  And psoas muscle were completely skeletonized and free of tumor. However there was about a 4 cm plaque-like mass on the lateral pelvic side wall  That likely represented tumor with soft surrounding  Peritoneum and retroperitoneal tissue. This at this point was obviously lateral to the vasculature of the ureter and this was excised back into the pelvic sidewall with grossly normal tissue in all direction. The specimen was oriented as a new margin and sent separately. The abdomen was thoroughly irrigated  And hemostasis obtained. A functional  end-to-end anastomosis was created between the terminal ileum and the transverse colon  With the GIA 75 mm stapler and the common enterotomy was closed with the TA 60 stapler. The anastomosis was under no tension with good blood supply and appeared airtight to pressure. The mesenteric defect was closed with interrupted silks. At this point the abdomen was irrigated. All gloves gowns and instruments and drapes were changed.  On exploring the abdomen again the right ovary was brought up out of the pelvis and there was a good size lobulated mass  In the right ovary. The left ovary and tube appeared normal. We elected to proceed with right salpingo-oophorectomy due to possible metastatic disease. As noted previously the gonadal vessels had been divided above the tumor and as well below the tumor during the resection. This was away from the ureter and the gonadal vessels and tube were mobilized and the tube divided and tied with 0 Vicryl and the tube and ovary and mass completely removed. The abdomen was again irrigatedand bleeding. The viscera returned to their anatomic position. Midline fascia was closed with looped running  #1 PDS begun either of the incision and tied centrally. Subtenon's tissue was irrigatedand skin closed with staples. Sponge needle and instrument counts were correct.    Findings: As above  Estimated Blood Loss:  200 mL         Drains: none  Blood Given: none          Specimens: #1 right hemicolectomy    #  2 right salpingo-oophorectomy        Complications:  * No complications entered in OR log *         Disposition: PACU - hemodynamically stable.         Condition: stable

## 2016-02-07 NOTE — Anesthesia Procedure Notes (Signed)
Procedure Name: Intubation Date/Time: 02/07/2016 12:56 PM Performed by: West Pugh Pre-anesthesia Checklist: Patient identified, Emergency Drugs available, Suction available, Patient being monitored and Timeout performed Patient Re-evaluated:Patient Re-evaluated prior to inductionOxygen Delivery Method: Circle system utilized Preoxygenation: Pre-oxygenation with 100% oxygen Intubation Type: IV induction Ventilation: Mask ventilation without difficulty Laryngoscope Size: Mac and 4 Grade View: Grade I Tube type: Oral Tube size: 7.0 mm Number of attempts: 1 Airway Equipment and Method: Stylet Placement Confirmation: ETT inserted through vocal cords under direct vision,  positive ETCO2,  CO2 detector and breath sounds checked- equal and bilateral Secured at: 22 cm Tube secured with: Tape Dental Injury: Teeth and Oropharynx as per pre-operative assessment

## 2016-02-07 NOTE — H&P (Signed)
History of Present Illness Brittney Spencer T. Brittney Barth MD; 01/26/2016 9:29 AM) The patient is a 65 year old female who presents with colorectal cancer. Patient was admitted on 09/15/2015. At that time she had had about 1 week of persistent right lower quadrant abdominal pain. She was seen by her OB/GYN and set up for an outpatient CT scan that was done several days later and was reported to show perforated appendicitis with abscess. She was instructed to go for admission and came to [facility] and was admitted. We were unable to obtain images to review in regards to possible percutaneous drainage and therefore CT scan of the abdomen and pelvis was repeated on 09/15/15. This showed no apparent abscess but a phlegmon around the tip of the cecum most consistent with ruptured appendicitis or cecal diverticulitis as below:  Stomach/Bowel: Inflammatory process in the right lower quadrant present with irregularly enhancing region of inflammation inferior and medial to the cecal tip. Extent of the inflammatory process measures approximately 4.5 cm in greatest diameter and is largely enhancing tissue with very little liquefaction. Small serpiginous liquefied areas measure only approximately up to 8 mm in thickness and there is no discrete drainable abscess present.  There is some adjacent inflammatory thickening of the terminal ileum. The process likely relates to ruptured appendicitis in this location. Cecal diverticulitis is another possibility. Inflammatory bowel disease is felt to be less likely. A calcified appendicolith is not visualized. No evidence of free intraperitoneal air. No ileus or associated small bowel obstruction. No masses or lymphadenopathy identified.  Vascular/Lymphatic: No vascular abnormalities.  Reproductive: The uterus is enlarged with a dominant posterior exophytic fibroid present demonstrating prominent degenerative calcification. No adnexal masses identified.  Other: No  ascites.  Musculoskeletal: Bony structures are normal.  IMPRESSION: Inflammatory process of the right lower quadrant likely representing ruptured appendicitis with inflammatory phlegmon. Additional differential consideration is diverticulitis at the tip of the cecum. Regional area of inflammatory tissue measures approximately 4.5 cm in greatest diameter. Very little liquefaction is present in this area with small serpiginous liquefied areas present measuring less than 1 cm in thickness. No discrete drainable abscess is identified.  She subsequently clinically resolved all symptoms with IV antibiotics. Follow-up colonoscopy was recommended which was delayed somewhat due to the patient going through movement selling her house. However this was recently completed by Dr. [name] on 01/19/2016 which revealed a "fungating and ulcerated malignant appearing almost completely obstructing mass in the mid ascending colon".  Final pathology from biopsy of colonoscopy has revealed invasive adenocarcinoma with ulceration.  Follow-up CT scan on 421 showed improvement in the area cecal inflammation consistent with perforated appendicitis although some increased thickening of the cecal tip was noted with a prominent lymph node in the ileocolonic mesentery and the possibility of cecal malignancy was raised.  She reports she is feeling well without abdominal pain or GI symptoms.   Allergies Elbert Ewings, CMA; 01/26/2016 8:43 AM) BusPIRone HCl *ANTIANXIETY AGENTS* SOY LECITHIN PEANUT FISH  Medication History Elbert Ewings, CMA; 01/26/2016 8:43 AM) Norvasc (2.5MG  Tablet, Oral) Active. Triamterene-HCTZ (37.5-25MG  Tablet, Oral) Active. Florastor (250MG  Capsule, Oral) Active. MiraLax (Oral) Active. ALPRAZolam (0.25MG  Tablet, Oral) Active. Medications Reconciled  Vitals Elbert Ewings CMA; 01/26/2016 8:43 AM) 01/26/2016 8:43 AM Weight: 129 lb Height: 65.5in Body Surface Area: 1.65 m Body Mass  Index: 21.14 kg/m  Temp.: 98.54F(Temporal)  Pulse: 72 (Regular)  BP: 124/82 (Sitting, Left Arm, Standard)       Physical Exam Brittney Spencer T. [name] MD; 01/26/2016 9:30 AM) The  physical exam findings are as follows: Note:General: Alert, thin but well-appearing African-American female, in no distress Skin: Warm and dry without rash or infection. HEENT: No palpable masses or thyromegaly. Sclera nonicteric. Pupils equal round and reactive. Oropharynx clear. Lymph nodes: No cervical, supraclavicular, or inguinal nodes palpable. Lungs: Breath sounds clear and equal. No wheezing or increased work of breathing. Cardiovascular: Regular rate and rhythm without murmer. No JVD or edema. Peripheral pulses intact. No carotid bruits. Abdomen: Nondistended. Soft and nontender. There is fullness and a soft mass palpable in the right lower quadrant. Extremities: No edema or joint swelling or deformity. No chronic venous stasis changes. Neurologic: Alert and fully oriented. Gait normal. No focal weakness. Psychiatric: Normal mood and affect. Thought content appropriate with normal judgement and insight    Assessment & Plan Brittney Spencer T. Evaline Waltman MD; 01/26/2016 9:40 AM) COLON CANCER, ASCENDING (C18.2) Impression: Adenocarcinoma of the ascending colon. There is been some delay in treatment due to arranging colonoscopy and her personal schedule. This does appear to be a somewhat bulky mass in nearly obstructing but no evidence of distant metastatic disease. I discussed all this with the patient. I recommended proceeding as soon as possible with laparoscopic and possible open right hemicolectomy. We discussed the nature of the procedure and expected recovery and risks of anesthetic complications, bleeding, infection, anastomotic leak and likely need for follow-up treatment. All her questions were answered. Current Plans Pt Education - Pamphlet Given - Laparoscopic Colorectal Surgery: discussed with  patient and provided information. Laparoscopic partial colectomy

## 2016-02-07 NOTE — Anesthesia Preprocedure Evaluation (Addendum)
Anesthesia Evaluation  Patient identified by MRN, date of birth, ID band Patient awake    Reviewed: Allergy & Precautions, H&P , NPO status , Patient's Chart, lab work & pertinent test results  Airway Mallampati: II  TM Distance: >3 FB Neck ROM: full    Dental  (+) Dental Advisory Given, Edentulous Upper, Missing   Pulmonary neg pulmonary ROS,    Pulmonary exam normal breath sounds clear to auscultation       Cardiovascular Exercise Tolerance: Good hypertension, Pt. on medications Normal cardiovascular exam Rhythm:regular Rate:Normal  4.1 cm AAA   Neuro/Psych negative neurological ROS  negative psych ROS   GI/Hepatic negative GI ROS, Neg liver ROS,   Endo/Other  negative endocrine ROS  Renal/GU negative Renal ROS  negative genitourinary   Musculoskeletal   Abdominal   Peds  Hematology negative hematology ROS (+)   Anesthesia Other Findings   Reproductive/Obstetrics negative OB ROS                            Anesthesia Physical Anesthesia Plan  ASA: III  Anesthesia Plan: General   Post-op Pain Management:    Induction: Intravenous  Airway Management Planned: Oral ETT  Additional Equipment:   Intra-op Plan:   Post-operative Plan: Extubation in OR  Informed Consent: I have reviewed the patients History and Physical, chart, labs and discussed the procedure including the risks, benefits and alternatives for the proposed anesthesia with the patient or authorized representative who has indicated his/her understanding and acceptance.   Dental Advisory Given  Plan Discussed with: CRNA  Anesthesia Plan Comments:         Anesthesia Quick Evaluation

## 2016-02-07 NOTE — Anesthesia Postprocedure Evaluation (Signed)
Anesthesia Post Note  Patient: Brittney Spencer  Procedure(s) Performed: Procedure(s) (LRB): LAPAROSCOPIC ASSISTED RIGHT HEMI COLECTOMY AND RIGHT SALPINGO OOPHERECTOMY (Right)  Patient location during evaluation: PACU Anesthesia Type: General Level of consciousness: awake and alert Pain management: pain level controlled Vital Signs Assessment: post-procedure vital signs reviewed and stable Respiratory status: spontaneous breathing, nonlabored ventilation, respiratory function stable and patient connected to nasal cannula oxygen Cardiovascular status: blood pressure returned to baseline and stable Postop Assessment: no signs of nausea or vomiting Anesthetic complications: no    Last Vitals:  Vitals:   02/07/16 1700 02/07/16 1710  BP: (!) 113/53 120/68  Pulse: 74 73  Resp: 16 15  Temp:  36.6 C    Last Pain:  Vitals:   02/07/16 1710  TempSrc:   PainSc: 2                  Larya Charpentier L

## 2016-02-07 NOTE — Interval H&P Note (Signed)
History and Physical Interval Note:  02/07/2016 12:37 PM  Brittney Spencer  has presented today for surgery, with the diagnosis of cancer right colon  The various methods of treatment have been discussed with the patient and family. After consideration of risks, benefits and other options for treatment, the patient has consented to  Procedure(s): LAPAROSCOPIC ASSISTED RIGHT HEMI COLECTOMY (Right) as a surgical intervention .  The patient's history has been reviewed, patient examined, no change in status, stable for surgery.  I have reviewed the patient's chart and labs.  Questions were answered to the patient's satisfaction.     Camiah Humm T

## 2016-02-07 NOTE — Transfer of Care (Signed)
Immediate Anesthesia Transfer of Care Note  Patient: Brittney Spencer  Procedure(s) Performed: Procedure(s): LAPAROSCOPIC ASSISTED RIGHT HEMI COLECTOMY AND RIGHT SALPINGO OOPHERECTOMY (Right)  Patient Location: PACU  Anesthesia Type:General  Level of Consciousness:  sedated, patient cooperative and responds to stimulation  Airway & Oxygen Therapy:Patient Spontanous Breathing and Patient connected to face mask oxgen  Post-op Assessment:  Report given to PACU RN and Post -op Vital signs reviewed and stable  Post vital signs:  Reviewed and stable  Last Vitals:  Vitals:   02/07/16 1625 02/07/16 1630  BP: 132/71 (!) 112/99  Pulse: 76 76  Resp: 14 13  Temp: [id number] C     Complications: No apparent anesthesia complications

## 2016-02-08 ENCOUNTER — Encounter (HOSPITAL_COMMUNITY): Payer: Self-pay | Admitting: General Surgery

## 2016-02-08 LAB — CBC
HCT: 28.2 % — ABNORMAL LOW (ref 36.0–46.0)
Hemoglobin: 8.8 g/dL — ABNORMAL LOW (ref 12.0–15.0)
MCH: 23.3 pg — ABNORMAL LOW (ref 26.0–34.0)
MCHC: 31.2 g/dL (ref 30.0–36.0)
MCV: 74.6 fL — ABNORMAL LOW (ref 78.0–100.0)
PLATELETS: 297 10*3/uL (ref 150–400)
RBC: 3.78 MIL/uL — AB (ref 3.87–5.11)
RDW: 14.6 % (ref 11.5–15.5)
WBC: 11.1 10*3/uL — AB (ref 4.0–10.5)

## 2016-02-08 LAB — BASIC METABOLIC PANEL
Anion gap: 5 (ref 5–15)
BUN: 7 mg/dL (ref 6–20)
CO2: 26 mmol/L (ref 22–32)
CREATININE: 0.73 mg/dL (ref 0.44–1.00)
Calcium: 8 mg/dL — ABNORMAL LOW (ref 8.9–10.3)
Chloride: 104 mmol/L (ref 101–111)
Glucose, Bld: 180 mg/dL — ABNORMAL HIGH (ref 65–99)
POTASSIUM: 4.8 mmol/L (ref 3.5–5.1)
SODIUM: 135 mmol/L (ref 135–145)

## 2016-02-08 MED ORDER — POTASSIUM CHLORIDE IN NACL 20-0.9 MEQ/L-% IV SOLN
INTRAVENOUS | Status: DC
  Administered 2016-02-08: 75 mL/h via INTRAVENOUS
  Administered 2016-02-09 – 2016-02-12 (×4): via INTRAVENOUS
  Filled 2016-02-08 (×7): qty 1000

## 2016-02-08 MED ORDER — PNEUMOCOCCAL VAC POLYVALENT 25 MCG/0.5ML IJ INJ
0.5000 mL | INJECTION | INTRAMUSCULAR | Status: DC
  Filled 2016-02-08 (×2): qty 0.5

## 2016-02-08 NOTE — Progress Notes (Signed)
Patient ID: Brittney Spencer, female   DOB: Apr 30, 1951, 65 y.o.   MRN: [id number] 1 Day Post-Op  Subjective: Comfortable. Some soreness and incisional pain relieved with medications. Has been walking. Denies nausea.  Objective: Vital signs in last 24 hours: Temp:  [97.8 F (36.6 C)-99.4 F (37.4 C)] 98.9 F (37.2 C) (08/10 0950) Pulse Rate:  [73-108] 108 (08/10 0950) Resp:  [13-16] 16 (08/10 0950) BP: (102-132)/(53-99) 112/75 (08/10 0950) SpO2:  [100 %] 100 % (08/10 0950)    Intake/Output from previous day: 08/09 0701 - 08/10 0700 In: 4186.7 [I.V.:4186.7] Out: 1240 [Urine:1065; Blood:175] Intake/Output this shift: Total I/O In: -  Out: 600 [Urine:600]  General appearance: alert, cooperative and no distress Resp: clear to auscultation bilaterally GI: soft and nondistended with minimal appropriate tenderness Incision/Wound: honeycomb dressing in place without erythema or unusual drainage  Lab Results:   Recent Labs  02/07/16 2004 02/08/16 0423  WBC  --  11.1*  HGB 9.6* 8.8*  HCT 30.5* 28.2*  PLT  --  297   BMET  Recent Labs  02/07/16 1050 02/08/16 0423  NA  --  135  K 4.6 4.8  CL  --  104  CO2  --  26  GLUCOSE  --  180*  BUN  --  7  CREATININE  --  0.73  CALCIUM  --  8.0*     Studies/Results: No results found.  Anti-infectives: Anti-infectives    Start     Dose/Rate Route Frequency Ordered Stop   02/07/16 1200  neomycin (MYCIFRADIN) tablet 1,000 mg  Status:  Discontinued     1,000 mg Oral 3 times per day 02/07/16 1200 02/07/16 1720   02/07/16 1200  metroNIDAZOLE (FLAGYL) tablet 1,000 mg  Status:  Discontinued     1,000 mg Oral 3 times per day 02/07/16 1200 02/07/16 1720   02/07/16 1024  cefoTEtan (CEFOTAN) 2 g in dextrose 5 % 50 mL IVPB     2 g 100 mL/hr over 30 Minutes Intravenous On call to O.R. 02/07/16 1024 02/07/16 1305      Assessment/Plan: s/p Procedure(s): LAPAROSCOPIC ASSISTED RIGHT HEMI COLECTOMY AND RIGHT SALPINGO  OOPHERECTOMY Doing well postoperatively without apparent complication Blood loss anemia. This is on top of some chronic anemia. No need for transfusion, we will monitor Hyperglycemia. We will take dextrose out of IV fluids Start clear liquid diet.   LOS: 1 day    Ennis Delpozo T 02/08/2016

## 2016-02-09 LAB — BASIC METABOLIC PANEL
Anion gap: 4 — ABNORMAL LOW (ref 5–15)
BUN: 5 mg/dL — ABNORMAL LOW (ref 6–20)
CALCIUM: 8.2 mg/dL — AB (ref 8.9–10.3)
CHLORIDE: 106 mmol/L (ref 101–111)
CO2: 27 mmol/L (ref 22–32)
CREATININE: 0.78 mg/dL (ref 0.44–1.00)
GFR calc non Af Amer: >60 mL/min (ref >60)
GLUCOSE: 95 mg/dL (ref 65–99)
Potassium: 4 mmol/L (ref 3.5–5.1)
Sodium: 137 mmol/L (ref 135–145)

## 2016-02-09 LAB — CBC
HEMATOCRIT: 25 % — AB (ref 36.0–46.0)
HEMOGLOBIN: 7.9 g/dL — AB (ref 12.0–15.0)
MCH: 23.9 pg — ABNORMAL LOW (ref 26.0–34.0)
MCHC: 31.6 g/dL (ref 30.0–36.0)
MCV: 75.5 fL — AB (ref 78.0–100.0)
Platelets: 238 10*3/uL (ref 150–400)
RBC: 3.31 MIL/uL — ABNORMAL LOW (ref 3.87–5.11)
RDW: 15 % (ref 11.5–15.5)
WBC: 6.7 10*3/uL (ref 4.0–10.5)

## 2016-02-09 NOTE — Progress Notes (Signed)
Patient ID: Kaylenn Pittinger, female   DOB: 07-07-1950, 65 y.o.   MRN: [id number] Patient ID: Latacha Puricelli, female   DOB: Feb 22, 1951, 65 y.o.   MRN: [id number] 2 Days Post-Op  Subjective: Comfortable. Some soreness and incisional pain relieved with medications. Has been walking. Denies nausea.  Objective: Vital signs in last 24 hours: Temp:  [98.7 F (37.1 C)-99.1 F (37.3 C)] 98.9 F (37.2 C) (08/11 0532) Pulse Rate:  [99-108] 100 (08/11 0532) Resp:  [15-16] 16 (08/11 0532) BP: (108-121)/(66-75) 108/71 (08/11 0532) SpO2:  [100 %] 100 % (08/11 0532)    Intake/Output from previous day: 08/10 0701 - 08/11 0700 In: 1260 [P.O.:360; I.V.:900] Out: 1850 [Urine:1850] Intake/Output this shift: Total I/O In: -  Out: 300 [Urine:300]  General appearance: alert, cooperative and no distress Resp: clear to auscultation bilaterally GI: soft and nondistended with minimal appropriate tenderness Incision/Wound: honeycomb dressing in place without erythema or unusual drainage  Lab Results:   Recent Labs  02/08/16 0423 02/09/16 0432  WBC 11.1* 6.7  HGB 8.8* 7.9*  HCT 28.2* 25.0*  PLT 297 238   BMET  Recent Labs  02/08/16 0423 02/09/16 0432  NA 135 137  K 4.8 4.0  CL 104 106  CO2 26 27  GLUCOSE 180* 95  BUN 7 5*  CREATININE 0.73 0.78  CALCIUM 8.0* 8.2*     Studies/Results: No results found.  Anti-infectives: Anti-infectives    Start     Dose/Rate Route Frequency Ordered Stop   02/07/16 1200  neomycin (MYCIFRADIN) tablet 1,000 mg  Status:  Discontinued     1,000 mg Oral 3 times per day 02/07/16 1200 02/07/16 1720   02/07/16 1200  metroNIDAZOLE (FLAGYL) tablet 1,000 mg  Status:  Discontinued     1,000 mg Oral 3 times per day 02/07/16 1200 02/07/16 1720   02/07/16 1024  cefoTEtan (CEFOTAN) 2 g in dextrose 5 % 50 mL IVPB     2 g 100 mL/hr over 30 Minutes Intravenous On call to O.R. 02/07/16 1024 02/07/16 1305      Assessment/Plan: s/p Procedure(s): LAPAROSCOPIC  ASSISTED RIGHT HEMI COLECTOMY AND RIGHT SALPINGO OOPHERECTOMY Doing well postoperatively without apparent complication Blood loss anemia. This is on top of some chronic anemia. No need for transfusion, we will monitor Hyperglycemia. We will take dextrose out of IV fluids Tolerating clear liquids which she has just started. Leave on these today and advance diet over the weekend   LOS: 2 days    Christon Parada T 02/09/2016

## 2016-02-10 LAB — CBC
HEMATOCRIT: 26 % — AB (ref 36.0–46.0)
HEMOGLOBIN: 8.3 g/dL — AB (ref 12.0–15.0)
MCH: 23.9 pg — AB (ref 26.0–34.0)
MCHC: 31.9 g/dL (ref 30.0–36.0)
MCV: 74.7 fL — AB (ref 78.0–100.0)
Platelets: 281 10*3/uL (ref 150–400)
RBC: 3.48 MIL/uL — ABNORMAL LOW (ref 3.87–5.11)
RDW: 15.2 % (ref 11.5–15.5)
WBC: 5.5 10*3/uL (ref 4.0–10.5)

## 2016-02-10 NOTE — Progress Notes (Signed)
3 Days Post-Op  Subjective: Tolerating clear liquids with no n/v.  Passing a little gas.  Objective: Vital signs in last 24 hours: Temp:  [98.7 F (37.1 C)-99.3 F (37.4 C)] 98.7 F (37.1 C) (08/12 0456) Pulse Rate:  [107-113] 107 (08/12 0456) Resp:  [15-16] 16 (08/12 0456) BP: (123-146)/(73-82) 146/82 (08/12 0456) SpO2:  [100 %] 100 % (08/12 0456) Last BM Date: 02/07/16  Intake/Output from previous day: 08/11 0701 - 08/12 0700 In: 896.7 [P.O.:340; I.V.:556.7] Out: 850 [Urine:850] Intake/Output this shift: No intake/output data recorded.  PE: General- In NAD Abdomen-soft, active bowel sounds, incision clean and intact  Lab Results:   Recent Labs  02/09/16 0432 02/10/16 0502  WBC 6.7 5.5  HGB 7.9* 8.3*  HCT 25.0* 26.0*  PLT 238 281   BMET  Recent Labs  02/08/16 0423 02/09/16 0432  NA 135 137  K 4.8 4.0  CL 104 106  CO2 26 27  GLUCOSE 180* 95  BUN 7 5*  CREATININE 0.73 0.78  CALCIUM 8.0* 8.2*   PT/INR No results for input(s): LABPROT, INR in the last 72 hours. Comprehensive Metabolic Panel:    Component Value Date/Time   NA 137 02/09/2016 0432   NA 135 02/08/2016 0423   K 4.0 02/09/2016 0432   K 4.8 02/08/2016 0423   CL 106 02/09/2016 0432   CL 104 02/08/2016 0423   CO2 27 02/09/2016 0432   CO2 26 02/08/2016 0423   BUN 5 (L) 02/09/2016 0432   BUN 7 02/08/2016 0423   CREATININE 0.78 02/09/2016 0432   CREATININE 0.73 02/08/2016 0423   GLUCOSE 95 02/09/2016 0432   GLUCOSE 180 (H) 02/08/2016 0423   CALCIUM 8.2 (L) 02/09/2016 0432   CALCIUM 8.0 (L) 02/08/2016 0423   AST 18 09/15/2015 1458   ALT 15 09/15/2015 1458   ALKPHOS 72 09/15/2015 1458   BILITOT 0.6 09/15/2015 1458   PROT 8.0 09/15/2015 1458   ALBUMIN 3.5 09/15/2015 1458     Studies/Results: No results found.  Anti-infectives: Anti-infectives    Start     Dose/Rate Route Frequency Ordered Stop   02/07/16 1200  neomycin (MYCIFRADIN) tablet 1,000 mg  Status:  Discontinued     1,000 mg Oral 3 times per day 02/07/16 1200 02/07/16 1720   02/07/16 1200  metroNIDAZOLE (FLAGYL) tablet 1,000 mg  Status:  Discontinued     1,000 mg Oral 3 times per day 02/07/16 1200 02/07/16 1720   02/07/16 1024  cefoTEtan (CEFOTAN) 2 g in dextrose 5 % 50 mL IVPB     2 g 100 mL/hr over 30 Minutes Intravenous On call to O.R. 02/07/16 1024 02/07/16 1305      Assessment Active Problems:   Right colon cancer s/p LAPAROSCOPIC ASSISTED RIGHT HEMI COLECTOMY AND RIGHT SALPINGO OOPHERECTOMY-bowel function starting to return.  ABL anemia-acute on chronic->stable  HTN-well controlled on home meds.    LOS: 3 days   Plan: Advance to full liquid diet.   Shanin Szymanowski J 02/10/2016

## 2016-02-11 NOTE — Progress Notes (Signed)
4 Days Post-Op  Subjective: Tolerating some full liquids.  Passing a very small amount of gas.  No BM.  Objective: Vital signs in last 24 hours: Temp:  [98.3 F (36.8 C)-98.8 F (37.1 C)] 98.8 F (37.1 C) (08/13 0521) Pulse Rate:  [103-108] 103 (08/13 0521) Resp:  [16] 16 (08/13 0521) BP: (103-120)/(62-68) 120/62 (08/13 0521) SpO2:  [100 %] 100 % (08/13 0521) Last BM Date:  (pta)  Intake/Output from previous day: 08/12 0701 - 08/13 0700 In: 1680 [P.O.:1080; I.V.:600] Out: -  Intake/Output this shift: No intake/output data recorded.  PE: General- In NAD Abdomen-soft, hypoactive bowel sounds this morning, incision clean and intact  Lab Results:   Recent Labs  02/09/16 0432 02/10/16 0502  WBC 6.7 5.5  HGB 7.9* 8.3*  HCT 25.0* 26.0*  PLT 238 281   BMET  Recent Labs  02/09/16 0432  NA 137  K 4.0  CL 106  CO2 27  GLUCOSE 95  BUN 5*  CREATININE 0.78  CALCIUM 8.2*   PT/INR No results for input(s): LABPROT, INR in the last 72 hours. Comprehensive Metabolic Panel:    Component Value Date/Time   NA 137 02/09/2016 0432   NA 135 02/08/2016 0423   K 4.0 02/09/2016 0432   K 4.8 02/08/2016 0423   CL 106 02/09/2016 0432   CL 104 02/08/2016 0423   CO2 27 02/09/2016 0432   CO2 26 02/08/2016 0423   BUN 5 (L) 02/09/2016 0432   BUN 7 02/08/2016 0423   CREATININE 0.78 02/09/2016 0432   CREATININE 0.73 02/08/2016 0423   GLUCOSE 95 02/09/2016 0432   GLUCOSE 180 (H) 02/08/2016 0423   CALCIUM 8.2 (L) 02/09/2016 0432   CALCIUM 8.0 (L) 02/08/2016 0423   AST 18 09/15/2015 1458   ALT 15 09/15/2015 1458   ALKPHOS 72 09/15/2015 1458   BILITOT 0.6 09/15/2015 1458   PROT 8.0 09/15/2015 1458   ALBUMIN 3.5 09/15/2015 1458     Studies/Results: No results found.  Anti-infectives: Anti-infectives    Start     Dose/Rate Route Frequency Ordered Stop   02/07/16 1200  neomycin (MYCIFRADIN) tablet 1,000 mg  Status:  Discontinued     1,000 mg Oral 3 times per day 02/07/16  1200 02/07/16 1720   02/07/16 1200  metroNIDAZOLE (FLAGYL) tablet 1,000 mg  Status:  Discontinued     1,000 mg Oral 3 times per day 02/07/16 1200 02/07/16 1720   02/07/16 1024  cefoTEtan (CEFOTAN) 2 g in dextrose 5 % 50 mL IVPB     2 g 100 mL/hr over 30 Minutes Intravenous On call to O.R. 02/07/16 1024 02/07/16 1305      Assessment Active Problems:   Right colon cancer s/p LAPAROSCOPIC ASSISTED RIGHT HEMI COLECTOMY AND RIGHT SALPINGO OOPHERECTOMY-some bowel function but still with some ileus..  ABL anemia-acute on chronic->stable  HTN-well controlled on home meds.    LOS: 4 days   Plan: Keep on full liquids.  Marcheta Horsey J 02/11/2016

## 2016-02-12 MED ORDER — ONDANSETRON HCL 4 MG PO TABS: 4.0000 mg | ORAL_TABLET | Freq: Four times a day (QID) | ORAL | 0 refills | Status: DC | PRN

## 2016-02-12 MED ORDER — HYDROCODONE-ACETAMINOPHEN 5-325 MG PO TABS: 1.0000 | ORAL_TABLET | ORAL | 0 refills | Status: DC | PRN

## 2016-02-12 NOTE — Discharge Instructions (Signed)
[facility]      [facility] [phone]  OPEN ABDOMINAL SURGERY: POST OP INSTRUCTIONS  Always review your discharge instruction sheet given to you by the facility where your surgery was performed.  IF YOU HAVE DISABILITY OR FAMILY LEAVE FORMS, YOU MUST BRING THEM TO THE OFFICE FOR PROCESSING.  PLEASE DO NOT GIVE THEM TO YOUR DOCTOR.  1. A prescription for pain medication may be given to you upon discharge.  Take your pain medication as prescribed, if needed.  If narcotic pain medicine is not needed, then you may take acetaminophen (Tylenol) or ibuprofen (Advil) as needed. 2. Take your usually prescribed medications unless otherwise directed. 3. If you need a refill on your pain medication, please contact your pharmacy. They will contact our office to request authorization.  Prescriptions will not be filled after 5pm or on week-ends. 4. You should follow a light diet the first few days after arrival home, such as soup and crackers, pudding, etc.unless your doctor has advised otherwise. A high-fiber, low fat diet can be resumed as tolerated.   Be sure to include lots of fluids daily. Most patients will experience some swelling and bruising on the chest and neck area.  Ice packs will help.  Swelling and bruising can take several days to resolve 5. Most patients will experience some swelling and bruising in the area of the incision. Ice pack will help. Swelling and bruising can take several days to resolve..  6. It is common to experience some constipation if taking pain medication after surgery.  Increasing fluid intake and taking a stool softener will usually help or prevent this problem from occurring.  A mild laxative (Milk of Magnesia or Miralax) should be taken according to package directions if there are no bowel movements after 48 hours. 7.  You may have steri-strips (small skin tapes) in place directly over the incision.  These strips should be left on the skin for 7-10 days.  If your  surgeon used skin glue on the incision, you may shower in 24 hours.  The glue will flake off over the next 2-3 weeks.  Any sutures or staples will be removed at the office during your follow-up visit. You may find that a light gauze bandage over your incision may keep your staples from being rubbed or pulled. You may shower and replace the bandage daily. 8. ACTIVITIES:  You may resume regular (light) daily activities beginning the next day--such as daily self-care, walking, climbing stairs--gradually increasing activities as tolerated.  You may have sexual intercourse when it is comfortable.  Refrain from any heavy lifting or straining until approved by your doctor. a. You may drive when you no longer are taking prescription pain medication, you can comfortably wear a seatbelt, and you can safely maneuver your car and apply brakes b. Return to Work: ___________________________________ 9. You should see your doctor in the office for a follow-up appointment approximately two weeks after your surgery.  Make sure that you call for this appointment within a day or two after you arrive home to insure a convenient appointment time. OTHER INSTRUCTIONS:  _____________________________________________________________ _____________________________________________________________  WHEN TO CALL YOUR DOCTOR: 1. Fever over 101.0 2. Inability to urinate 3. Nausea and/or vomiting 4. Extreme swelling or bruising 5. Continued bleeding from incision. 6. Increased pain, redness, or drainage from the incision. 7. Difficulty swallowing or breathing 8. Muscle cramping or spasms. 9. Numbness or tingling in hands or feet or around lips.  The clinic staff is available to   answer your questions during regular business hours.  Please don't hesitate to call and ask to speak to one of the nurses if you have concerns.  For further questions, please visit [email or web address]   

## 2016-02-12 NOTE — Discharge Summary (Signed)
Patient ID: Brittney Spencer [id number] y.o. Mar 30, 1951  02/07/2016  Discharge date and time: 02/12/2016   Admitting Physician: [name] T  Discharge Physician: [name] T  Admission Diagnoses: cancer right colon  Discharge Diagnoses: Same  Operations: Procedure(s): LAPAROSCOPIC ASSISTED RIGHT HEMI COLECTOMY AND RIGHT SALPINGO OOPHERECTOMY  Admission Condition: fair  Discharged Condition: good  Indication for Admission: Patient initially presented with  Acute right lower quadrant abdominal pain in February of this year. CT scan was consistent with a perforated appendicitis. She was treated nonoperatively with IV antibiotics with resolution of her symptoms. Posttreatment CT scan questioned  Some thickening of the cecum, question neoplasm. Colonoscopy was recommended and was delayed somewhat due to patient issues but recently completed which revealed an obvious malignancy in the cecum  With partial obstruction. Biopsy revealed adenocarcinoma. After  Review and discussion in the office we elected to proceed with laparoscopic-assisted and possible open right hemicolectomy. Patient is admitted for this procedure following a mechanical and antibiotic bowel prep at home.  Hospital Course: On the day of admission the patient underwent a right hemicolectomy. At laparoscopy there was a bulky somewhat fixed tumor in the cecum. The more distal colon was mobilized laparoscopically and then the procedure was converted to an open operation. There was found to be a perforated cancer with a chronic cavity invading into the posterior lateral retroperitoneum.  The cancer was very bulky and locally invasive but was grossly completely removed  With ileotransverse colostomy. Final pathology revealed a 5 cm tumor  With microscopically broadly positive lateral margin in the retroperitoneum (grossly negative by inspection of the time of surgery) and 2 out of 42 lymph nodes positive. I had  marked the retroperitoneum with clips for possible radiation therapy. Postoperative course was unremarkable. She was begun on clear liquids on the first postoperative day and was able to be gradually advanced to a full liquid diet. She began having bowel movements. CBCshowed no elevated white count and hemoglobin stabilized. Her abdomen is soft and benign and wound healing well. She is felt ready for discharge on the fourth postoperative day.  Disposition: Home  Patient Instructions:    Medication List    TAKE these medications   acetaminophen 325 MG tablet Commonly known as:  TYLENOL Take 2 tablets (650 mg total) by mouth every 6 (six) hours as needed for mild pain, moderate pain, fever or headache.   ALPRAZolam 0.25 MG tablet Commonly known as:  XANAX Take 0.25 mg by mouth daily as needed for anxiety.   [employer] PROBIOTIC PO Take 1 capsule by mouth daily.   HYDROcodone-acetaminophen 5-325 MG tablet Commonly known as:  NORCO/VICODIN Take 1-2 tablets by mouth every 4 (four) hours as needed for moderate pain or severe pain.   NORVASC 2.5 MG tablet Generic drug:  amLODipine Take 2.5 mg by mouth daily.   ondansetron 4 MG tablet Commonly known as:  ZOFRAN Take 1 tablet (4 mg total) by mouth every 6 (six) hours as needed for nausea.   oxyCODONE-acetaminophen 5-325 MG tablet Commonly known as:  PERCOCET/ROXICET Take 1-2 tablets by mouth every 4 (four) hours as needed for moderate pain.   polyethylene glycol packet Commonly known as:  MIRALAX / GLYCOLAX 1 dose daily, you can buy this as any pharmacy without a prescription. What changed:  how much to take  how to take this  when to take this  reasons to take this  additional instructions   triamterene-hydrochlorothiazide 37.5-25 MG tablet Commonly known as:  MAXZIDE-25 Take 0.5  tablets by mouth daily.   Vitamin D-3 1000 units Caps Take 1,000 Units by mouth daily.       Activity: no heavy lifting for 4 weeks Diet:  regular diet Wound Care: none needed  Follow-up:  With Dr. [name] in 1 week.  Signed: Edward Jolly MD, FACS  02/12/2016, 12:49 PM

## 2016-02-12 NOTE — Progress Notes (Signed)
Patient ID: Brittney Spencer, female   DOB: 1950-09-21, 65 y.o.   MRN: [id number] 5 Days Post-Op  Subjective: No complaints this morning. Tolerating full liquid diet without nausea. Has had several bowel movements. Denies abdominal pain.  Objective: Vital signs in last 24 hours: Temp:  [98.5 F (36.9 C)-99.5 F (37.5 C)] 98.5 F (36.9 C) (08/14 0530) Pulse Rate:  [93-98] 93 (08/14 0530) Resp:  [16-18] 18 (08/14 0530) BP: (112-123)/(68-71) 121/70 (08/14 0530) SpO2:  [99 %-100 %] 99 % (08/14 0530) Last BM Date: 02/07/16  Intake/Output from previous day: 08/13 0701 - 08/14 0700 In: 2400 [P.O.:1200; I.V.:1200] Out: -  Intake/Output this shift: Total I/O In: 240 [P.O.:240] Out: -   General appearance: alert, cooperative and no distress GI: soft and nontender and nondistended Incision/Wound: clean and dry  Lab Results:   Recent Labs  02/10/16 0502  WBC 5.5  HGB 8.3*  HCT 26.0*  PLT 281   BMET No results for input(s): NA, K, CL, CO2, GLUCOSE, BUN, CREATININE, CALCIUM in the last 72 hours.   Studies/Results: No results found.  Anti-infectives: Anti-infectives    Start     Dose/Rate Route Frequency Ordered Stop   02/07/16 1200  neomycin (MYCIFRADIN) tablet 1,000 mg  Status:  Discontinued     1,000 mg Oral 3 times per day 02/07/16 1200 02/07/16 1720   02/07/16 1200  metroNIDAZOLE (FLAGYL) tablet 1,000 mg  Status:  Discontinued     1,000 mg Oral 3 times per day 02/07/16 1200 02/07/16 1720   02/07/16 1024  cefoTEtan (CEFOTAN) 2 g in dextrose 5 % 50 mL IVPB     2 g 100 mL/hr over 30 Minutes Intravenous On call to O.R. 02/07/16 1024 02/07/16 1305      Assessment/Plan: s/p Procedure(s): LAPAROSCOPIC ASSISTED RIGHT HEMI COLECTOMY AND RIGHT SALPINGO OOPHERECTOMY Doing well without complication, ready for discharge  We reviewed pathology and  Medical and radiation oncology follow-up will be obtained as an outpatient.   LOS: 5 days    [name]  T 02/12/2016

## 2016-02-20 ENCOUNTER — Encounter (HOSPITAL_COMMUNITY): Payer: Self-pay

## 2016-02-23 ENCOUNTER — Encounter: Payer: Self-pay | Admitting: *Deleted

## 2016-02-26 ENCOUNTER — Telehealth: Payer: Self-pay | Admitting: *Deleted

## 2016-02-26 NOTE — Telephone Encounter (Signed)
Oncology Nurse Navigator Documentation  Oncology Nurse Navigator Flowsheets 02/26/2016  Navigator Location [facility]  Navigator Encounter Type Introductory phone call  Abnormal Finding Date 10/25/2015  Confirmed Diagnosis Date 01/19/2016  Surgery Date 02/07/2016   Spoke with patient and confirmed new patient appointment for 02/27/16 at 2:30 pm with Dr. Burr Medico. Confirmed  location of [facility] and what visit will entail, valet service, and registration process. Reminded to bring insurance cards and a current medication list, including supplements. Patient verbalizes understanding. Explained the role of the nurse navigator and that I will see her tomorrow at her visit. She had no questions.

## 2016-02-27 ENCOUNTER — Encounter: Payer: Self-pay | Admitting: Radiation Oncology

## 2016-02-27 ENCOUNTER — Ambulatory Visit (HOSPITAL_BASED_OUTPATIENT_CLINIC_OR_DEPARTMENT_OTHER): Payer: Federal, State, Local not specified - PPO

## 2016-02-27 ENCOUNTER — Encounter: Payer: Self-pay | Admitting: *Deleted

## 2016-02-27 ENCOUNTER — Ambulatory Visit (HOSPITAL_BASED_OUTPATIENT_CLINIC_OR_DEPARTMENT_OTHER): Payer: Federal, State, Local not specified - PPO | Admitting: Hematology

## 2016-02-27 ENCOUNTER — Encounter: Payer: Self-pay | Admitting: Hematology

## 2016-02-27 VITALS — BP 124/71 | HR 81 | Temp 98.8°F | Resp 18 | Ht 65.5 in | Wt 121.9 lb

## 2016-02-27 DIAGNOSIS — D500 Iron deficiency anemia secondary to blood loss (chronic): Secondary | ICD-10-CM

## 2016-02-27 DIAGNOSIS — C182 Malignant neoplasm of ascending colon: Secondary | ICD-10-CM

## 2016-02-27 DIAGNOSIS — D649 Anemia, unspecified: Secondary | ICD-10-CM | POA: Diagnosis not present

## 2016-02-27 DIAGNOSIS — I10 Essential (primary) hypertension: Secondary | ICD-10-CM | POA: Diagnosis not present

## 2016-02-27 LAB — CBC & DIFF AND RETIC
BASO%: 0.5 % (ref 0.0–2.0)
Basophils Absolute: 0 10*3/uL (ref 0.0–0.1)
EOS%: 4.6 % (ref 0.0–7.0)
Eosinophils Absolute: 0.2 10*3/uL (ref 0.0–0.5)
HCT: 30.1 % — ABNORMAL LOW (ref 34.8–46.6)
HGB: 9.5 g/dL — ABNORMAL LOW (ref 11.6–15.9)
Immature Retic Fract: 6.2 % (ref 1.60–10.00)
LYMPH#: 2.3 10*3/uL (ref 0.9–3.3)
LYMPH%: 54.1 % — ABNORMAL HIGH (ref 14.0–49.7)
MCH: 23.8 pg — ABNORMAL LOW (ref 25.1–34.0)
MCHC: 31.6 g/dL (ref 31.5–36.0)
MCV: 75.4 fL — ABNORMAL LOW (ref 79.5–101.0)
MONO#: 0.4 10*3/uL (ref 0.1–0.9)
MONO%: 8.9 % (ref 0.0–14.0)
NEUT%: 31.9 % — ABNORMAL LOW (ref 38.4–76.8)
NEUTROS ABS: 1.3 10*3/uL — AB (ref 1.5–6.5)
Platelets: 343 10*3/uL (ref 145–400)
RBC: 3.99 10*6/uL (ref 3.70–5.45)
RDW: 15.9 % — AB (ref 11.2–14.5)
RETIC %: 1.5 % (ref 0.70–2.10)
RETIC CT ABS: 59.85 10*3/uL (ref 33.70–90.70)
WBC: 4.2 10*3/uL (ref 3.9–10.3)

## 2016-02-27 LAB — COMPREHENSIVE METABOLIC PANEL
ANION GAP: 10 meq/L (ref 3–11)
AST: 16 U/L (ref 5–34)
Albumin: 3.6 g/dL (ref 3.5–5.0)
Alkaline Phosphatase: 67 U/L (ref 40–150)
BUN: 10.6 mg/dL (ref 7.0–26.0)
CHLORIDE: 103 meq/L (ref 98–109)
CO2: 27 meq/L (ref 22–29)
CREATININE: 0.8 mg/dL (ref 0.6–1.1)
Calcium: 9.9 mg/dL (ref 8.4–10.4)
EGFR: 87 mL/min/{1.73_m2} — ABNORMAL LOW (ref >90)
GLUCOSE: 94 mg/dL (ref 70–140)
Potassium: 4.3 mEq/L (ref 3.5–5.1)
SODIUM: 140 meq/L (ref 136–145)
Total Bilirubin: <0.3 mg/dL (ref 0.20–1.20)
Total Protein: 7.8 g/dL (ref 6.4–8.3)

## 2016-02-27 NOTE — Progress Notes (Signed)
Oncology Nurse Navigator Documentation  Oncology Nurse Navigator Flowsheets 02/27/2016  Navigator Location [facility]  Navigator Encounter Type Initial MedOnc  Abnormal Finding Date -  Confirmed Diagnosis Date -  Surgery Date -  Patient Visit Type MedOnc;Initial  Treatment Phase Pre-Tx/Tx Discussion  Barriers/Navigation Needs Education  Education Understanding Cancer/ Treatment Options;Coping with Diagnosis/ Prognosis;Newly Diagnosed Cancer Education;Preparing for Upcoming Treatment  Interventions Education Method  Education Method Verbal;Written;Teach-back  Support Groups/Services GI Support Group;[place] (declined ACS referral)  Acuity Level 2  Time Spent with Patient 65  Met with patient and her only child, Donita during new patient visit. Explained the role of the GI Nurse Navigator and provided New Patient Packet with information on: 1. Colon* cancer--anatomy of colon, CEA test, PAC, exercise information as well as handouts on chemo drugs 2. Support groups 3. Advanced Directives 4. Fall Safety Plan Answered questions, reviewed current treatment plan using TEACH back and provided emotional support. Provided copy of current treatment plan. Ms. 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 She enjoys writing and reading. She attends several Bible studies as well. She currently lives with her sister in [place] after selling her home in [place]. She has a good understanding of treatment recommendations and wants to think about things after seeing Dr. Lisbeth Renshaw on 8/31. She agrees to labs today and to schedule a chemo education class. Merceda Elks, RN, BSN GI Oncology [place]

## 2016-02-27 NOTE — Progress Notes (Addendum)
GI Location of Tumor / Histology: Adenocarcinoma of Ascending COLON  Brittney Spencer presented  months ago with symptoms [id number] abdominal discomfort  Biopsies of  (if applicable) revealed: [id number]: Colon, segmental resection for tumor,right=Invasive moderately differentiate adenocarcinoma,spannng 5 cm in greatest dimension.Tumor arises from the cecum and invades through the full thickness of the bowel and perforates the serosa.Proximal and distal margins are neg(please see specimen #2 for final marin status) 2 of 43 lymph nodes positive for metastatic colorectal adenocarcinoma. 2.Soft tissue mass, simple excision,pelvic side wall.-Positive for colorectal adenocarcinoma,margin is broadly positive. 3. Adnexa-ovary +/- tube,neoplastic, right  Ovarian fibroma with assoc calcifications.benign fallopian tube,no atypia or malignancy identified,  Dr. [name], MD  Past/Anticipated interventions by surgeon, if any: Dr. Marland Kitchen [place],MD=02/07/16: d/c home 02/12/16 Procedure: Procedure(s): LAPAROSCOPIC ASSISTED RIGHT HEMI COLECTOMY AND RIGHT SALPINGO OOPHERECTOMY   Surgeon: [name] T   Assistants: Alphonsa Overall, followed up  02/21/16 staples removed, incision well healed, follow  up in one month   Past/Anticipated interventions by medical oncology, if any: Dr. Burr Medico appt 02/27/16  Weight changes, if any:  Has gained  A couple ounds since d/c hospitl appetite mproving  Bowel/Bladder complaints, if any: regular bowels,  Bladder normal   Nausea / Vomiting, if any:   Pain issues, if any:   none  Any blood per rectum:   None  SAFETY ISSUES: No  Prior radiation? NO  Pacemaker/ICD? NO  Possible current pregnancy? NO  Is the patient on methotrexate? NO  Current Complaints/Details: non smoker, no smokeless tobacco  no alcohol or drug use    Allergies: Buspirone hcl,-anxiety    soy lecithin,stomach aches,  peanutitching throat , fish itching, swelling  Mother lung cancer  (smoker)mets to brain,age 108-60 deceased, Maternal grandfather bladder cancer,  3:47 PM BP 103/67 (BP Location: Right Arm, Patient Position: Sitting, Cuff Size: Normal)   Pulse 85   Temp 98.8 F (37.1 C) (Oral)   Resp 18   Ht 5' 5.5" (1.664 m)   Wt 122 lb (55.3 kg)   SpO2 100% Comment: room air  BMI 19.99 kg/m   Wt Readings from Last 3 Encounters:  02/29/16 122 lb (55.3 kg)  02/27/16 121 lb 14.4 oz (55.3 kg)  02/07/16 126 lb (57.2 kg)   Ct  Chest scheduled 03/06/2016 at 1pm

## 2016-02-27 NOTE — Patient Instructions (Signed)
Care Plan Summary- 02/27/2016 Name:  Jerriann Haag       DOB:  24-Mar-1951 Your Medical Team: Medical Oncologist:  Dr. [name] Radiation Oncologist:  Dr. Kyung Rudd Surgeon:   Dr. [name] Type of Cancer: Adenocarcinoma of Colon  Stage/Grade: Stage IIIB *Exact staging of your cancer is based on size of the tumor, depth of invasion, involvement of lymph nodes or not, and whether or not the cancer has spread beyond the primary site   Recommendations: Based on information available as of today's consult. Recommendations may change depending on the results of further tests or exams. 1) Radiation therapy with oral Xeloda M-F for 5-6 weeks-start in 2-3 weeks 2) Chemotherapy with CAPOX (IV Oxalipatin with Xeloda pill) every 3 weeks vs FOLFOX (IV Oxaliplatin, 5FU) every 2 weeks-2nd will require a port after radiation therapy completed 3) CT chest to complete staging  4) Baseline labs for baseline status of anemia  Next Steps:       1)  Labs today and follow up with Dr. Lisbeth Renshaw on 8/31 as scheduled 2) CT scan of chest in next 2 weeks 3) Chemotherapy education class in next 2 weeks 4) Follow up with Dr. Burr Medico according to your treatment decision after seeing Dr. Lisbeth Renshaw   Questions? Merceda Elks, RN, BSN at [phone]. Manuela Schwartz is your Oncology Nurse Navigator and is available to assist you while you're receiving your medical care at [facility].

## 2016-02-27 NOTE — Progress Notes (Signed)
[place]  [phone] [phone]  Clinic New Consult Note   Patient Care Team: Pcp Not In System as PCP - General 02/27/2016  CHIEF COMPLAINTS/PURPOSE OF CONSULTATION:  Newly diagnosed stage IIIC right colon cancer   Oncology History   Cancer of ascending colon [facility])   Staging form: Colon and Rectum, AJCC 7th Edition   - Clinical stage from 02/07/2016: Stage IIIC (T4b, N1b, M0) - Signed by [name], MD on 03/04/2016      Cancer of ascending colon ([place])   10/25/2015 Imaging    CT ABD/PELVIS:  Inflammatory changes inferior to the cecal tip appear improved, there is still irregular soft tissue thickening of the cecal tip, and there are adjacent prominent lymph nodes in the ileocolonic mesentery, measuring 13 mm on image 49 and 8 mm on image 52. In addition, there is a 2.5 x 1.8 cm nodule on image 46 which has central low density. Therefore, these findings are moderately suspicious for an underlying cecal malignancy with perforation.       01/19/2016 Procedure    COLONOSCOPY per Dr. [name]: Fungating, ulcerated mass almost obstructing mid ascending colon      02/05/2016 Tumor Marker    Patient's tumor was tested for the following markers: CEA Results of the tumor marker test revealed 5.7.      02/07/2016 Initial Diagnosis    Cancer of ascending colon ([place])     02/07/2016 Definitive Surgery    Laparoscopic assisted right hemicolectomy and right salpingo oopherectomy--Dr. [name]      02/07/2016 Pathologic Stage    p T4 N1b   2/43 nodes +      02/07/2016 Pathology Results    MMR normal; G2 adenocarcinoma;proximal & distal margins negative; soft tissue mass on pelvic sidewall + for adenocarcinoma with positive margin MSI Stable       HISTORY OF PRESENTING ILLNESS:  Brittney Spencer 65 y.o. female is here because of her newly diagnsosed ascending colon cancer. She is accompanied by her daughter to my clinic today.   She initially  presented with vaginal discharge and gastric discomfot., and was seen by her GYN and underwent CT scan which showedA perforated appendicitis. She was treated nonoperatively with IV antibiotics with resolution of her symptoms. Posttreatment CT scan questioned  Some thickening of the cecum, question neoplasm. Colonoscopy was recommended and was delayed somewhat due to patient issues and revealed an obvious malignancy in the cecum with partial obstruction. Biopsy revealed adenocarcinoma. She was referred to Dr. [name] and underwent laparoscopic-assisted and possible open right hemicolectomy on 02/07/2016. Per the OR note, at laparoscopy there was a bulky somewhat fixed tumor in the cecum. There was found to be a perforated cancer with a chronic cavity invading into the posterior lateral retroperitoneum.  The cancer was very bulky and locally invasive but was grossly completely removed  With ileotransverse colostomy. Final pathology revealed a 5 cm tumor  With microscopically broadly positive lateral margin in the retroperitoneum (grossly negative by inspection of the time of surgery) and 2 out of 42 lymph nodes positive.Dr. [name] marked the retroperitoneum with clips for possible radiation therapy. Postoperative course was unremarkable.   She has been recovering well from surgery. She has minimum pain at the incision site, staples removed, no infection or other complications. Her appetite and energy level have much improved, although not back to normal yet. She lost about 10 lbs in the past 5 month, but gained a few lbs lately.   MEDICAL HISTORY:  Past Medical History:  Diagnosis Date  . AAA (abdominal aortic aneurysm) (H[facility])    infrarenal 4.1 cmper s-9-19 scan on chart  . Anemia   . Anxiety   . Cancer ([place])    RIGHT COLON  . Hypertension   . Vitamin D deficiency     SURGICAL HISTORY: Past Surgical History:  Procedure Laterality Date  . COLONSCOPY  12/2015  . LAPAROSCOPIC RIGHT HEMI COLECTOMY  Right 02/07/2016   Procedure: LAPAROSCOPIC ASSISTED RIGHT HEMI COLECTOMY AND RIGHT SALPINGO OOPHERECTOMY;  Surgeon: [name], MD;  Location: [facility];  Service: General;  Laterality: Right;    SOCIAL HISTORY: Social History   Social History  . Marital status: Unknown    Spouse name: N/A  . Number of children: N/A  . Years of education: N/A   Occupational History  . Not on file.   Social History Main Topics  . Smoking status: Never Smoker  . Smokeless tobacco: Never Used  . Alcohol use No  . Drug use: No  . Sexual activity: Yes    Birth control/ protection: None   Other Topics Concern  . Not on file   Social History Narrative  . No narrative on file   She is a retired  She has one daughter   FAMILY HISTORY: Family History  Problem Relation Age of Onset  . Colon cancer Neg Hx     ALLERGIES:  is allergic to fish allergy; peanut-containing drug products; soy allergy; and buspirone.  MEDICATIONS:  Current Outpatient Prescriptions  Medication Sig Dispense Refill  . ALPRAZolam (XANAX) 0.25 MG tablet Take 0.25 mg by mouth daily as needed for anxiety.   3  . Cholecalciferol (VITAMIN D-3) 1000 units CAPS Take 1,000 Units by mouth daily.    . NORVASC 2.5 MG tablet Take 2.5 mg by mouth daily.    . polyethylene glycol (MIRALAX / GLYCOLAX) packet 1 dose daily, you can buy this as any pharmacy without a prescription. (Patient taking differently: Take 17 g by mouth daily as needed for mild constipation. ) 14 each 0  . Probiotic Product ([employer] PROBIOTIC PO) Take 1 capsule by mouth daily.     Marland Kitchen triamterene-hydrochlorothiazide (MAXZIDE-25) 37.5-25 MG tablet Take 0.5 tablets by mouth daily.  3  . acetaminophen (TYLENOL) 325 MG tablet Take 2 tablets (650 mg total) by mouth every 6 (six) hours as needed for mild pain, moderate pain, fever or headache. (Patient not taking: Reported on 01/05/2016)    . HYDROcodone-acetaminophen (NORCO/VICODIN) 5-325 MG tablet Take 1-2 tablets by mouth  every 4 (four) hours as needed for moderate pain or severe pain. (Patient not taking: Reported on 02/27/2016) 30 tablet 0  . ondansetron (ZOFRAN) 4 MG tablet Take 1 tablet (4 mg total) by mouth every 6 (six) hours as needed for nausea. (Patient not taking: Reported on 02/27/2016) 10 tablet 0  . oxyCODONE-acetaminophen (PERCOCET/ROXICET) 5-325 MG tablet Take 1-2 tablets by mouth every 4 (four) hours as needed for moderate pain. (Patient not taking: Reported on 01/05/2016) 40 tablet 0   No current facility-administered medications for this visit.     REVIEW OF SYSTEMS:   Constitutional: Denies fevers, chills or abnormal night sweats Eyes: Denies blurriness of vision, double vision or watery eyes Ears, nose, mouth, throat, and face: Denies mucositis or sore throat Respiratory: Denies cough, dyspnea or wheezes Cardiovascular: Denies palpitation, chest discomfort or lower extremity swelling Gastrointestinal:  Denies nausea, heartburn or change in bowel habits Skin: Denies abnormal skin rashes Lymphatics: Denies new lymphadenopathy or  easy bruising Neurological:Denies numbness, tingling or new weaknesses Behavioral/Psych: Mood is stable, no new changes  All other systems were reviewed with the patient and are negative.  PHYSICAL EXAMINATION: ECOG PERFORMANCE STATUS: 1 - Symptomatic but completely ambulatory  Vitals:   02/27/16 1440  BP: 124/71  Pulse: 81  Resp: 18  Temp: 98.8 F (37.1 C)   Filed Weights   02/27/16 1440  Weight: 121 lb 14.4 oz (55.3 kg)    GENERAL:alert, no distress and comfortable SKIN: skin color, texture, turgor are normal, no rashes or significant lesions EYES: normal, conjunctiva are pink and non-injected, sclera clear OROPHARYNX:no exudate, no erythema and lips, buccal mucosa, and tongue normal  NECK: supple, thyroid normal size, non-tender, without nodularity LYMPH:  no palpable lymphadenopathy in the cervical, axillary or inguinal LUNGS: clear to auscultation  and percussion with normal breathing effort HEART: regular rate & rhythm and no murmurs and no lower extremity edema ABDOMEN:abdomen soft, non-tender and normal bowel sounds, surgical wound has healed well. Musculoskeletal:no cyanosis of digits and no clubbing  PSYCH: alert & oriented x 3 with fluent speech NEURO: no focal motor/sensory deficits  LABORATORY DATA:  I have reviewed the data as listed CBC Latest Ref Rng & Units 02/27/2016 02/10/2016 02/09/2016  WBC 3.9 - 10.3 10e3/uL 4.2 5.5 6.7  Hemoglobin 11.6 - 15.9 g/dL 9.5(L) 8.3(L) 7.9(L)  Hematocrit 34.8 - 46.6 % 30.1(L) 26.0(L) 25.0(L)  Platelets 145 - 400 10e3/uL 343 281 238   CMP Latest Ref Rng & Units 02/27/2016 02/09/2016 02/08/2016  Glucose 70 - 140 mg/dl 94 95 180(H)  BUN 7.0 - 26.0 mg/dL 10.6 5(L) 7  Creatinine 0.6 - 1.1 mg/dL 0.8 0.78 0.73  Sodium 136 - 145 mEq/L 140 137 135  Potassium 3.5 - 5.1 mEq/L 4.3 4.0 4.8  Chloride 101 - 111 mmol/L - 106 104  CO2 22 - 29 mEq/L '27 27 26  ' Calcium 8.4 - 10.4 mg/dL 9.9 8.2(L) 8.0(L)  Total Protein 6.4 - 8.3 g/dL 7.8 - -  Total Bilirubin 0.20 - 1.20 mg/dL <0.30 - -  Alkaline Phos 40 - 150 U/L 67 - -  AST 5 - 34 U/L 16 - -  ALT 0 - 55 U/L <9 - -   Pathology report  Diagnosis 02/07/2016 1. Colon, segmental resection for tumor, right - INVASIVE MODERATELY DIFFERENTIATED ADENOCARCINOMA, SPANNING 5 CM IN GREATEST DIMENSION. - TUMOR ARISES FROM THE CECUM AND INVADES THROUGH THE FULL THICKNESS OF THE BOWEL AND PERFORATES THE SEROSA. - PROXIMAL AND DISTAL MARGINS ARE NEGATIVE (PLEASE SEE SPECIMEN #2 FOR FINAL MARGIN STATUS). - TWO OF FORTY-THREE LYMPH NODES POSITIVE FOR METASTATIC COLORECTAL ADENOCARCINOMA (2/43). - SEE ONCOLOGY TEMPLATE. 2. Soft tissue mass, simple excision, pelvic side wall - POSITIVE FOR COLORECTAL ADENOCARCINOMA. - MARGIN IS BROADLY POSITIVE. 3. Adnexa - ovary +/- tube, neoplastic, right - OVARIAN FIBROMA WITH ASSOCIATED CALCIFICATIONS. - BENIGN FALLOPIAN TUBE - NO  ATYPIA OR MALIGNANCY IDENTIFIED. - SEE COMMENT. Microscopic Comment 1. COLON AND RECTUM (INCLUDING TRANS-ANAL RESECTION): Specimen: Right partial colon with additional right pelvic sidewall tissue. Procedure: Right hemicolectomy with lateral pelvic sidewall excision. Tumor site: Cecum. Specimen integrity: Intact. Macroscopic intactness of mesorectum: Not applicable. Macroscopic tumor perforation: Yes, adenocarcinoma macroscopically perforates serosa. Invasive tumor: Maximum size: 5 cm. Histologic type(s): Adenocarcinoma. Histologic grade and differentiation G2: moderately differentiated/low grade. Type of polyp in which invasive carcinoma arose: A definitive precursor polyp is not identified. Microscopic extension of invasive tumor: Tumor invades through the entire colon and perforates the serosa. Lymph-Vascular invasion: Definitive  lymph/vascular invasion is not identified, however there is lymph node positivity, see below Peri-neural invasion: Not identified. Tumor deposit(s) (discontinuous extramural extension): No tumor deposits are identified. Resection margins: Proximal margin: Negative. Distal margin: Negative. Circumferential (radial) (posterior ascending, posterior descending; lateral and posterior mid-rectum; and entire lower 1/3 rectum): Tumor involves pelvic sidewall margin (positive margin), see below comment. Mesenteric margin (sigmoid and transverse): Not applicable. Distance closest margin (if all above margins negative): Tumor invades into pelvic sidewall with positive pelvic sidewall margin, see below comment. Trans-anal resection margins only: Deep margin: Not applicable. Mucosal Margin: Not applicable. Distance closest mucosal margin (if negative): Not applicable. Treatment effect (neo-adjuvant therapy): Not applicable. Additional polyp(s): The possible mucosal polyp seen grossly simply demonstrates benign polypoid colorectal mucosa without a true polyp  identified. Non-neoplastic findings: No additional significant non-neoplastic findings. Lymph nodes: number examined 43; number positive: 2. Pathologic Staging: pT4b, pN1b, R2, see below comment. Ancillary studies: The tumor will be sent for MSI by PCR and MMR by [facility] per colorectal cancer protocol. Comments: As the tumor is perforated with invasion into the pelvic sidewall, this is assessed as representing direct extension into another structure and is therefore compatible with pT4b tumor. The margin as indicated by the surgeon of the pelvic sidewall is grossly positive on pathologic gross examination and shows broad positivity on microscopic examination. The findings are consistent with the above assessment. The case was discussed with Dr. [name] on 02/09/2016. 3. Of note, the right fallopian tube in the third specimen demonstrates findings suggestive of previous tubal ligation. Please correlate with surgical history. (RH:kh 02-09-16) Willeen Niece MD Pathologist, Electronic Signature  1. Mismatch Repair (MMR) Protein Immunohistochemistry (IHC) IHC Expression Result: MLH1: Preserved nuclear expression (greater 50% tumor expression) MSH2: Preserved nuclear expression (greater 50% tumor expression) MSH6: Preserved nuclear expression (greater 50% tumor expression) PMS2: Preserved nuclear expression (greater 50% tumor expression) * Internal control demonstrates intact nuclear expression     RADIOGRAPHIC STUDIES: I have personally reviewed the radiological images as listed and agreed with the findings in the report. No results found.  ASSESSMENT & PLAN:  65 year old female, with past medical history of hypertension and abdominal aorta aneurysm, presented with perforation right cecum colon cancer.  1. Cancer of ascending colon, pT4bN1bM0, stage IIIC, MSI-stable, (+) surgical margins at pelvic wall  -I discussed her surgical pathology report and prior CT scan findings in great details  with patient and her daughter. -Due to the locally advanced disease, perforated tumor, and positive surgical margins, she is at extremely high risk of cancer recurrence. -Her case was discussed in our tumor board, we recommend adjuvant chemotherapy and irradiation -She will see Dr. Lisbeth Renshaw later this week, I recommend her to start concurrent chemoradiation with Xeloda first, followed by adjuvant FOLFOX or CAPOX for adjuvant chemo.  -We'll complete her cancer staging with a CT chest. She has not had CT abdomen and pelvis since April 2017, I recommend her to have a repeated one also. --Chemotherapy consent: Side effects including but does not not limited to, fatigue, nausea, vomiting, diarrhea, hair loss, neuropathy, fluid retention, renal and kidney dysfunction, neutropenic fever, needed for blood transfusion, bleeding, coronary artery spasm and heart attack, were discussed with patient in great detail. She agrees to proceed. -I will call in xeloda 820m/m2, bid, with concurrent radiation -will monitor her lab and clinically closely when she is on adjvant therapy   2. HTN -She'll follow-up with her primary care physician and continue medication -we discussed that chemotherapy may affect her blood  pressure, we'll monitor her pressures closely  3. Anemia -Her anemia may be related to her surgery and iron deficiency - I will repeat her lab today, including iron study, to see if she needs iron replacement.  Recommendations: Based on information available as of today's consult. Recommendations may change depending on the results of further tests or exams. 1) Radiation therapy with oral Xeloda M-F for 5-6 weeks-start in 2-3 weeks 2) Chemotherapy with CAPOX (IV Oxalipatin with Xeloda pill) every 3 weeks vs FOLFOX (IV Oxaliplatin, 5FU) every 2 weeks-2nd will require a port after radiation therapy completed 3) CT chest to complete staging  4) Baseline labs for baseline status of anemia  Next Steps:        1)  Labs today and follow up with Dr. Lisbeth Renshaw on 8/31 as scheduled 2) CT scan of chest in next 2 weeks 3) Chemotherapy education class in next 2 weeks 4) Follow up with Dr. Burr Medico according to your treatment decision after seeing Dr. Lisbeth Renshaw   All questions were answered. The patient knows to call the clinic with any problems, questions or concerns. I spent 55 minutes counseling the patient face to face. The total time spent in the appointment was 60 minutes and more than 50% was on counseling.     [name], MD 02/27/2016 3:13 PM

## 2016-02-28 ENCOUNTER — Telehealth: Payer: Self-pay | Admitting: *Deleted

## 2016-02-28 LAB — FERRITIN: Ferritin: 29 ng/ml (ref 9–269)

## 2016-02-28 LAB — IRON AND TIBC
%SAT: 7 % — ABNORMAL LOW (ref 21–57)
Iron: 26 ug/dL — ABNORMAL LOW (ref 41–142)
TIBC: 359 ug/dL (ref 236–444)
UIBC: 333 ug/dL (ref 120–384)

## 2016-02-28 LAB — CEA (IN HOUSE-CHCC): CEA (CHCC-IN HOUSE): 1.23 ng/mL (ref 0.00–5.00)

## 2016-02-28 NOTE — Telephone Encounter (Signed)
Oncology Nurse Navigator Documentation  Oncology Nurse Navigator Flowsheets 02/28/2016  Navigator Location [facility]  Navigator Encounter Type Telephone  Telephone Outgoing Call;Appt Confirmation/Clarification  Abnormal Finding Date -  Confirmed Diagnosis Date -  Surgery Date -  Patient Visit Type -  Treatment Phase -  Barriers/Navigation Needs -coordination of care: CT chest   Education -  Interventions -Scheduled for [facility] 03/06/16 at 12:45/1:00 with no prep. Made aware of location of radiology department.  Education Method -  Support Groups/Services -  Acuity -  Time Spent with Patient -

## 2016-02-29 ENCOUNTER — Ambulatory Visit
Admission: RE | Admit: 2016-02-29 | Discharge: 2016-02-29 | Disposition: A | Discharge status: Home / Self Care | Payer: Federal, State, Local not specified - PPO | Source: Ambulatory Visit | Attending: Radiation Oncology | Admitting: Radiation Oncology

## 2016-02-29 ENCOUNTER — Encounter: Payer: Self-pay | Admitting: Radiation Oncology

## 2016-02-29 DIAGNOSIS — C182 Malignant neoplasm of ascending colon: Secondary | ICD-10-CM | POA: Diagnosis present

## 2016-02-29 DIAGNOSIS — Z510 Encounter for antineoplastic radiation therapy: Secondary | ICD-10-CM | POA: Diagnosis present

## 2016-02-29 DIAGNOSIS — Z9049 Acquired absence of other specified parts of digestive tract: Secondary | ICD-10-CM | POA: Diagnosis not present

## 2016-02-29 NOTE — Progress Notes (Signed)
Radiation Oncology         [phone]) [phone] ________________________________  Name: Brittney Spencer MRN: [id number]  Date: 02/29/2016  DOB: [birth year]  CC:Pcp Not In System  [name], MD     REFERRING PHYSICIAN: [name], MD   DIAGNOSIS: The encounter diagnosis was Cancer of ascending colon ([place]).   HISTORY OF PRESENT ILLNESS::Brittney Spencer is a 65 y.o. female who is seen for an initial consultation visit regarding the patient's diagnosis of Colon cancer.  The patient presented with some vaginal discharge and gastric discomfort.This ultimately led to a CT scan which showed some possible concerning findings. The patient did develop acute right lower quadrant abdominal pain in February of this year. The original CT scan was felt to be consistent with a perforated appendicitis. She was given antibiotics and a post treatment CT scan was performed. Some thickening of the cecum was noted, possibly related to malignancy. A colonoscopy was therefore recommended but this was delayed due to some patient issues. When this was completed, malignancy was seen in the cecum with partial obstruction. 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  The patient proceeded with a right hemicolectomy. This was converted to an open operation. There was found to be a perforated cancer with invasion into the posterior lateral retroperitoneum. The tumor was bulky and locally invasive but was grossly completely removed. Final pathology revealed a 5 cm tumor. The margin was broadly positive on the lateral margin in the retroperitoneum. A total of 2 out of 43 lymph nodes were positive.  The patient states that she has been recovering well from her surgery. No difficulties in terms of wound healing or infection.    PREVIOUS RADIATION THERAPY: No   PAST MEDICAL HISTORY:  has a past medical history of AAA (abdominal aortic aneurysm) ([place]); Anemia; Anxiety; Colon cancer ([place]); Hypertension; and Vitamin D  deficiency.     PAST SURGICAL HISTORY: Past Surgical History:  Procedure Laterality Date  . COLONSCOPY  12/2015  . LAPAROSCOPIC RIGHT HEMI COLECTOMY Right 02/07/2016   Procedure: LAPAROSCOPIC ASSISTED RIGHT HEMI COLECTOMY AND RIGHT SALPINGO OOPHERECTOMY;  Surgeon: [name], MD;  Location: [facility];  Service: General;  Laterality: Right;     FAMILY HISTORY: family history includes Cancer in her maternal grandfather and mother.   SOCIAL HISTORY:  reports that she has never smoked. She has never used smokeless tobacco. She reports that she does not drink alcohol or use drugs.   ALLERGIES: Fish allergy; Peanut-containing drug products; Soy allergy; and Buspirone   MEDICATIONS:  Current Outpatient Prescriptions  Medication Sig Dispense Refill  . ALPRAZolam (XANAX) 0.25 MG tablet Take 0.25 mg by mouth daily as needed for anxiety.   3  . NORVASC 2.5 MG tablet Take 2.5 mg by mouth daily.    . polyethylene glycol (MIRALAX / GLYCOLAX) packet 1 dose daily, you can buy this as any pharmacy without a prescription. (Patient taking differently: Take 17 g by mouth daily as needed for mild constipation. ) 14 each 0  . Probiotic Product ([facility] PROBIOTIC PO) Take 1 capsule by mouth daily.     Marland Kitchen triamterene-hydrochlorothiazide (MAXZIDE-25) 37.5-25 MG tablet Take 0.5 tablets by mouth daily.  3  . acetaminophen (TYLENOL) 325 MG tablet Take 2 tablets (650 mg total) by mouth every 6 (six) hours as needed for mild pain, moderate pain, fever or headache. (Patient not taking: Reported on 01/05/2016)    . Cholecalciferol (VITAMIN D-3) 1000 units CAPS Take 1,000 Units by mouth daily.    Marland Kitchen HYDROcodone-acetaminophen (NORCO/VICODIN) 5-325  MG tablet Take 1-2 tablets by mouth every 4 (four) hours as needed for moderate pain or severe pain. (Patient not taking: Reported on 02/27/2016) 30 tablet 0  . ondansetron (ZOFRAN) 4 MG tablet Take 1 tablet (4 mg total) by mouth every 6 (six) hours as needed for nausea. (Patient  not taking: Reported on 02/27/2016) 10 tablet 0  . oxyCODONE-acetaminophen (PERCOCET/ROXICET) 5-325 MG tablet Take 1-2 tablets by mouth every 4 (four) hours as needed for moderate pain. (Patient not taking: Reported on 01/05/2016) 40 tablet 0   No current facility-administered medications for this encounter.      REVIEW OF SYSTEMS:  A 15 point review of systems is documented in the electronic medical record. This was obtained by the nursing staff. However, I reviewed this with the patient to discuss relevant findings and make appropriate changes.  Pertinent items are noted in HPI.    PHYSICAL EXAM:  height is 5' 5.5" (1.664 m) and weight is 122 lb (55.3 kg). Her oral temperature is 98.8 F (37.1 C). Her blood pressure is 103/67 and her pulse is 85. Her respiration is 18 and oxygen saturation is 100%.   ECOG = 1  0 - Asymptomatic (Fully active, able to carry on all predisease activities without restriction)  1 - Symptomatic but completely ambulatory (Restricted in physically strenuous activity but ambulatory and able to carry out work of a light or sedentary nature. For example, light housework, office work)  2 - Symptomatic, <50% in bed during the day (Ambulatory and capable of all self care but unable to carry out any work activities. Up and about more than 50% of waking hours)  3 - Symptomatic, >50% in bed, but not bedbound (Capable of only limited self-care, confined to bed or chair 50% or more of waking hours)  4 - Bedbound (Completely disabled. Cannot carry on any self-care. Totally confined to bed or chair)  5 - Death   Eustace Pen MM, Creech RH, Tormey DC, et al. [phone]). "Toxicity and response criteria of the [facility]". [place] Oncol. 5 (6): 649-55  General: Well-developed, in no acute distress HEENT: Normocephalic, atraumatic Cardiovascular: Regular rate and rhythm Respiratory: Clear to auscultation bilaterally GI: Soft, nontender, normal bowel sounds;   Well-healed surgical incision present anteriorly with Steri-Strips remaining. Extremities: No edema present    LABORATORY DATA:  Lab Results  Component Value Date   WBC 4.2 02/27/2016   HGB 9.5 (L) 02/27/2016   HCT 30.1 (L) 02/27/2016   MCV 75.4 (L) 02/27/2016   PLT 343 02/27/2016   Lab Results  Component Value Date   NA 140 02/27/2016   K 4.3 02/27/2016   CL 106 02/09/2016   CO2 27 02/27/2016   Lab Results  Component Value Date   ALT <9 02/27/2016   AST 16 02/27/2016   ALKPHOS 67 02/27/2016   BILITOT <0.30 02/27/2016      RADIOGRAPHY: No results found.     IMPRESSION:  The patient is status post a right hemicolectomy for a T4b N1 B N0 colon cancer. The margin was broadly positive in terms of the pelvic sidewall. The patient has been seen by medical oncology and it was discussed potentially proceeding with chemoradiation treatment given the risk of local recurrence. Subsequent chemotherapy as monotherapy could then be given as well.  I discussed the surgical results with the patient. We discussed radiation treatment. We discussed the possible side effects and risks as well as the benefit given the specifics of her  case. All of her questions were answered. I believe that she is very reasonable for a 5-1/2-6 week course of chemoradiation treatment.   PLAN: the patient wishes to proceed with radiation with concurrent chemotherapy. She'll be scheduled for simulation for future such that can proceed with treatment planning.    ________________________________   [name], MD, PhD   **Disclaimer: This note was dictated with voice recognition software. Similar sounding words can inadvertently be transcribed and this note may contain transcription errors which may not have been corrected upon publication of note.**

## 2016-03-04 ENCOUNTER — Encounter: Payer: Self-pay | Admitting: Hematology

## 2016-03-05 ENCOUNTER — Telehealth: Payer: Self-pay | Admitting: *Deleted

## 2016-03-05 NOTE — Telephone Encounter (Signed)
Oncology Nurse Navigator Documentation  Oncology Nurse Navigator Flowsheets 03/05/2016  Navigator Location [facility]  Navigator Encounter Type Telephone  Telephone Appt Confirmation/Clarification---CT scan  Abnormal Finding Date -  Confirmed Diagnosis Date -  Surgery Date -  Patient Visit Type -  Treatment Phase -  Barriers/Navigation Needs Coordination of Care--MD order to add CT of abd/pelvis to 03/06/16 since last A/P was in April.  Education -  Interventions Coordination of Care--patient agrees, but declines to do this tomorrow. Requests reschedule for 9/7 or 9/8.  Coordination of Care Radiology--rescheduled CT C/A/P for 9/8 at 12:30--patient notified of appointment and prep. She will pick up contrast this evening or tomorrow. Navigator left contrast with instructions in bag at front reception desk.  Education Method Verbal;Written;Teach-back  Support Groups/Services -  Acuity -  Time Spent with Patient 30  Dr. Burr Medico reports that CT C/A/P has been authorized.

## 2016-03-06 ENCOUNTER — Ambulatory Visit (HOSPITAL_COMMUNITY): Payer: Federal, State, Local not specified - PPO

## 2016-03-08 ENCOUNTER — Telehealth: Payer: Self-pay | Admitting: Hematology

## 2016-03-08 ENCOUNTER — Other Ambulatory Visit: Payer: Self-pay | Admitting: Hematology

## 2016-03-08 ENCOUNTER — Encounter (HOSPITAL_COMMUNITY): Payer: Self-pay

## 2016-03-08 ENCOUNTER — Ambulatory Visit (HOSPITAL_COMMUNITY)
Admission: RE | Admit: 2016-03-08 | Discharge: 2016-03-08 | Disposition: A | Discharge status: Home / Self Care | Payer: Federal, State, Local not specified - PPO | Source: Ambulatory Visit | Attending: Hematology | Admitting: Hematology

## 2016-03-08 DIAGNOSIS — C182 Malignant neoplasm of ascending colon: Secondary | ICD-10-CM | POA: Diagnosis not present

## 2016-03-08 DIAGNOSIS — R590 Localized enlarged lymph nodes: Secondary | ICD-10-CM | POA: Diagnosis not present

## 2016-03-08 DIAGNOSIS — Z9049 Acquired absence of other specified parts of digestive tract: Secondary | ICD-10-CM | POA: Insufficient documentation

## 2016-03-08 MED ORDER — IOPAMIDOL (ISOVUE-300) INJECTION 61%
100.0000 mL | Freq: Once | INTRAVENOUS | Status: AC | PRN
  Administered 2016-03-08: 100 mL via INTRAVENOUS

## 2016-03-08 NOTE — Telephone Encounter (Signed)
Called patient to conf appt. L/M per 02/27/16 los

## 2016-03-11 ENCOUNTER — Ambulatory Visit
Admission: RE | Admit: 2016-03-11 | Discharge: 2016-03-11 | Disposition: A | Discharge status: Home / Self Care | Payer: Federal, State, Local not specified - PPO | Source: Ambulatory Visit | Attending: Radiation Oncology | Admitting: Radiation Oncology

## 2016-03-11 DIAGNOSIS — Z510 Encounter for antineoplastic radiation therapy: Secondary | ICD-10-CM | POA: Diagnosis not present

## 2016-03-11 DIAGNOSIS — C182 Malignant neoplasm of ascending colon: Secondary | ICD-10-CM

## 2016-03-12 ENCOUNTER — Encounter: Payer: Self-pay | Admitting: *Deleted

## 2016-03-12 ENCOUNTER — Telehealth: Payer: Self-pay | Admitting: Radiation Oncology

## 2016-03-12 ENCOUNTER — Ambulatory Visit: Payer: Federal, State, Local not specified - PPO

## 2016-03-12 ENCOUNTER — Telehealth: Payer: Self-pay | Admitting: *Deleted

## 2016-03-12 ENCOUNTER — Other Ambulatory Visit: Payer: Self-pay | Admitting: Hematology

## 2016-03-12 ENCOUNTER — Telehealth: Payer: Self-pay | Admitting: Pharmacist

## 2016-03-12 MED ORDER — CAPECITABINE 500 MG PO TABS: 1500.0000 mg | ORAL_TABLET | Freq: Two times a day (BID) | ORAL | 0 refills | Status: DC

## 2016-03-12 NOTE — Telephone Encounter (Signed)
  Oncology Nurse Navigator Documentation  Navigator Location: [facility] (03/12/16 1612) Navigator Encounter Type: Telephone (03/12/16 1612) Telephone: Medication Assistance (03/12/16 1612)   Patient left VM asking for status of her Xeloda script and when the script will be submitted. Called back and made her aware it was sent in today to [facility], but due to high copay was then forwarded to [facility] per her insurance carrier plan. Should hear something by Thursday-navigator or pharmacist will follow up and call her.

## 2016-03-12 NOTE — Telephone Encounter (Signed)
Received New Rx for Xeloda. Faxed to [facility] outpatient pharmacy. Pt to start Xeloda + XRT on ~ 03/18/16.  Labs reviewed.  Drug interaction between Xeloda and PRN Zofran noted -> QTc prolongation. Zofran is PRN - will monitor.   Raul Del, PharmD, BCPS, [facility] [phone]

## 2016-03-12 NOTE — Telephone Encounter (Signed)
LVM to say that [name] appt time with Dr. Lisbeth Renshaw on Monday, 9/18 is 10:45a.

## 2016-03-12 NOTE — Telephone Encounter (Signed)
Received fax communication from [facility]. Xeloda copay is $1070.  They suggested copay may be cheaper through the [employer] service. Contacted [employer] and confirmed that the Xeloda rx should be faxed to [facility]; Fax = [phone], phone:  [phone]  Faxed cover letter, prescription, copy of insurance cards and copy of last MD OV visit note to fax number above. Fax confirmation received.  Raul Del, PharmD, BCPS, [facility] [phone]

## 2016-03-14 ENCOUNTER — Telehealth: Payer: Self-pay | Admitting: *Deleted

## 2016-03-14 ENCOUNTER — Encounter: Payer: Self-pay | Admitting: General Practice

## 2016-03-14 ENCOUNTER — Encounter: Payer: Federal, State, Local not specified - PPO | Admitting: Nutrition

## 2016-03-14 NOTE — Telephone Encounter (Signed)
Oncology Nurse Navigator Documentation  Oncology Nurse Navigator Flowsheets 03/14/2016  Navigator Location [facility]  Navigator Encounter Type Telephone  Telephone Outgoing Call;Appt Confirmation/Clarification;Medication Assistance  Abnormal Finding Date -  Confirmed Diagnosis Date -  Surgery Date -  Patient Visit Type -  Treatment Phase -  Barriers/Navigation Needs Coordination of Care  Education -  Interventions Coordination of Care--call to [facility] regarding Xeloda  Coordination of Care Appts;Chemo--co pay $35 for #120 pills and also with 2nd fill for #60. Message to MD to inquire when she needs follow with med   Education Method Verbal;Teach-back  Support Groups/Services -  Acuity -  Time Spent with Patient 15  Called to [facility] and spoke with [place] and confirmed the dosing of Xeloda for M-F. Notified patient of copay and gave her [facility] and [facility].

## 2016-03-14 NOTE — Progress Notes (Signed)
[facility] Spiritual Care Note  Met with Ms Manfredonia for >1 hour for spiritual direction and opportunity to process questions that she is considering in her prayer and congregational/organizational life. She is approaching her dx/tx with a prayerful posture and growth mindset.  Assisted with pastoral reflection, providing several tools for spiritual practices that may enrich her spiritual growth through this process.  She plans to reach out for further appointments as desired, but please also page if needs arise/circumstances change.  Thank you.   [place], [place], [facility] Pager [phone] Voicemail [phone]

## 2016-03-15 DIAGNOSIS — Z510 Encounter for antineoplastic radiation therapy: Secondary | ICD-10-CM | POA: Diagnosis not present

## 2016-03-15 NOTE — Progress Notes (Signed)
Xeloda RX follow-up  03/15/16  Contacted [facility] to determine RX status.  Product has shipped on 03/14/16 Estimated [facility]: [facility]  Will follow-up on 03/18/16 to ensure pick up and counseling.  Thank you  Henreitta Leber, PharmD [facility]

## 2016-03-15 NOTE — Progress Notes (Addendum)
  Radiation Oncology         [phone]) [phone] ________________________________  Name: Brittney Spencer MRN: [id number]  Date: 03/11/2016  DOB: April 03, 1951  SIMULATION AND TREATMENT PLANNING NOTE  DIAGNOSIS:     ICD-9-CM ICD-10-CM   1. Cancer of ascending colon (HCC) 153.6 C18.2      Site:  Right lower abdomen  NARRATIVE:  The patient was brought to the [facility].  Identity was confirmed.  All relevant records and images related to the planned course of therapy were reviewed.   Written consent to proceed with treatment was confirmed which was freely given after reviewing the details related to the planned course of therapy had been reviewed with the patient.  Then, the patient was set-up in a stable reproducible  supine position for radiation therapy.  CT images were obtained.  Surface markings were placed.    Medically necessary complex treatment device(s) for immobilization:  Customized vac lock bag.   The CT images were loaded into the planning software.  Then the target and avoidance structures were contoured.  Treatment planning then occurred.  The radiation prescription was entered and confirmed.  A total of 7 complex treatment devices were fabricated preliminarily which relate to the designed radiation treatment fields based on the patient's preliminary 3-D tomo plan on our tomotherapy unit. Each of these customized fields/ complex treatment devices will be used on a daily basis during the radiation course. I have requested : 3D Simulation  I have requested a DVH of the following structures: Target volume, bowel, right femoral head.   The patient will undergo daily image guidance to ensure accurate localization of the target, and adequate minimize dose to the normal surrounding structures in close proximity to the target.   PLAN:  The patient will receive 45 Gy in 25 fractions. It is anticipated that the patient will receive a 5.4 gray boost to yield a final total dose of  50.4 gray.   Special treatment procedure The patient will also receive concurrent chemotherapy during the treatment. The patient may therefore experience increased toxicity or side effects and the patient will be monitored for such problems. This may require extra lab work as necessary. This therefore constitutes a special treatment procedure.   ________________________________   [name], MD, PhD

## 2016-03-18 ENCOUNTER — Telehealth: Payer: Self-pay | Admitting: *Deleted

## 2016-03-18 ENCOUNTER — Ambulatory Visit
Admission: RE | Admit: 2016-03-18 | Payer: Federal, State, Local not specified - PPO | Source: Ambulatory Visit | Admitting: Radiation Oncology

## 2016-03-18 ENCOUNTER — Telehealth: Payer: Self-pay | Admitting: Hematology

## 2016-03-18 ENCOUNTER — Encounter: Payer: Self-pay | Admitting: Hematology

## 2016-03-18 ENCOUNTER — Ambulatory Visit (HOSPITAL_BASED_OUTPATIENT_CLINIC_OR_DEPARTMENT_OTHER): Payer: Federal, State, Local not specified - PPO | Admitting: Hematology

## 2016-03-18 ENCOUNTER — Ambulatory Visit: Payer: Medicare Other | Admitting: Nutrition

## 2016-03-18 ENCOUNTER — Other Ambulatory Visit (HOSPITAL_BASED_OUTPATIENT_CLINIC_OR_DEPARTMENT_OTHER): Payer: Federal, State, Local not specified - PPO

## 2016-03-18 ENCOUNTER — Ambulatory Visit: Payer: Federal, State, Local not specified - PPO

## 2016-03-18 VITALS — BP 133/69 | HR 80 | Temp 98.6°F | Resp 18 | Ht 65.5 in | Wt 123.4 lb

## 2016-03-18 DIAGNOSIS — C182 Malignant neoplasm of ascending colon: Secondary | ICD-10-CM

## 2016-03-18 DIAGNOSIS — D509 Iron deficiency anemia, unspecified: Secondary | ICD-10-CM | POA: Diagnosis not present

## 2016-03-18 DIAGNOSIS — I10 Essential (primary) hypertension: Secondary | ICD-10-CM

## 2016-03-18 LAB — COMPREHENSIVE METABOLIC PANEL
ALBUMIN: 3.8 g/dL (ref 3.5–5.0)
ALK PHOS: 74 U/L (ref 40–150)
ALT: 9 U/L (ref 0–55)
AST: 19 U/L (ref 5–34)
Anion Gap: 9 mEq/L (ref 3–11)
BUN: 15.5 mg/dL (ref 7.0–26.0)
CALCIUM: 9.9 mg/dL (ref 8.4–10.4)
CO2: 28 mEq/L (ref 22–29)
CREATININE: 0.9 mg/dL (ref 0.6–1.1)
Chloride: 102 mEq/L (ref 98–109)
EGFR: 80 mL/min/{1.73_m2} — ABNORMAL LOW (ref >90)
GLUCOSE: 88 mg/dL (ref 70–140)
Potassium: 3.9 mEq/L (ref 3.5–5.1)
SODIUM: 139 meq/L (ref 136–145)
TOTAL PROTEIN: 7.9 g/dL (ref 6.4–8.3)
Total Bilirubin: 0.47 mg/dL (ref 0.20–1.20)

## 2016-03-18 LAB — CBC & DIFF AND RETIC
BASO%: 0.5 % (ref 0.0–2.0)
BASOS ABS: 0 10*3/uL (ref 0.0–0.1)
EOS%: 2.5 % (ref 0.0–7.0)
Eosinophils Absolute: 0.1 10*3/uL (ref 0.0–0.5)
HEMATOCRIT: 31.2 % — AB (ref 34.8–46.6)
HGB: 9.7 g/dL — ABNORMAL LOW (ref 11.6–15.9)
Immature Retic Fract: 6.4 % (ref 1.60–10.00)
LYMPH%: 50.7 % — AB (ref 14.0–49.7)
MCH: 23.8 pg — AB (ref 25.1–34.0)
MCHC: 31.1 g/dL — AB (ref 31.5–36.0)
MCV: 76.7 fL — AB (ref 79.5–101.0)
MONO#: 0.5 10*3/uL (ref 0.1–0.9)
MONO%: 12.7 % (ref 0.0–14.0)
NEUT#: 1.4 10*3/uL — ABNORMAL LOW (ref 1.5–6.5)
NEUT%: 33.6 % — ABNORMAL LOW (ref 38.4–76.8)
Platelets: 212 10*3/uL (ref 145–400)
RBC: 4.07 10*6/uL (ref 3.70–5.45)
RDW: 18.1 % — AB (ref 11.2–14.5)
RETIC %: 1.48 % (ref 0.70–2.10)
Retic Ct Abs: 60.24 10*3/uL (ref 33.70–90.70)
WBC: 4 10*3/uL (ref 3.9–10.3)
lymph#: 2 10*3/uL (ref 0.9–3.3)

## 2016-03-18 NOTE — Progress Notes (Signed)
65 year old female diagnosed with colon cancer.  She is a patient of Dr. Burr Medico.  Past medical history includes AAA, anemia, anxiety, hypertension, and vitamin D deficiency.  Medications include Xanax, Xeloda, vitamin D3, Zofran, MiraLAX and probiotics.  Labs include albumin 3.6 on August 29.  Height: 65.5 inches. Weight: 123.4 pounds September 18. Usual body weight: 134 pounds March 2017. BMI: 20.22.  Patient reports she has a lot of anxiety regarding advancement of diet. Her physician has told her she can eat a regular diet.  However, to be careful. Noted patient has an allergy to fish, soy and peanuts.  Nutrition diagnosis:  Food and nutrition related knowledge deficit related to colon cancer as evidenced by no prior need for nutrition related information.  Intervention:  Patient was educated on how to advance diet by gradually adding high-fiber tolerated foods back into her meal plans I encouraged small frequent meals and snacks with high protein high calorie foods. Encouraged weight maintenance. Provided fact sheets. Questions were answered.  Teach back method used.  Contact information was given.  Monitoring, evaluation, goals: Patient will tolerate increased oral intake to minimize weight loss.  Next visit: To be scheduled as needed.   **Disclaimer: This note was dictated with voice recognition software. Similar sounding words can inadvertently be transcribed and this note may contain transcription errors which may not have been corrected upon publication of note.**

## 2016-03-18 NOTE — Progress Notes (Signed)
[place]  [phone] [phone]  Clinic Follow Up Note   Patient Care Team: Pcp Not In System as PCP - General 03/18/2016  CHIEF COMPLAINTS:  Follow up stage IIIC right colon cancer   Oncology History   Cancer of ascending colon [facility])   Staging form: Colon and Rectum, AJCC 7th Edition   - Clinical stage from 02/07/2016: Stage IIIC (T4b, N1b, M0) - Signed by [name], MD on 03/04/2016      Cancer of ascending colon ([place])   10/25/2015 Imaging    CT ABD/PELVIS:  Inflammatory changes inferior to the cecal tip appear improved, there is still irregular soft tissue thickening of the cecal tip, and there are adjacent prominent lymph nodes in the ileocolonic mesentery, measuring 13 mm on image 49 and 8 mm on image 52. In addition, there is a 2.5 x 1.8 cm nodule on image 46 which has central low density. Therefore, these findings are moderately suspicious for an underlying cecal malignancy with perforation.       01/19/2016 Procedure    COLONOSCOPY per Dr. [name]: Fungating, ulcerated mass almost obstructing mid ascending colon      02/05/2016 Tumor Marker    Patient's tumor was tested for the following markers: CEA Results of the tumor marker test revealed 5.7.      02/07/2016 Initial Diagnosis    Cancer of ascending colon ([place])     02/07/2016 Definitive Surgery    Laparoscopic assisted right hemicolectomy and right salpingo oopherectomy--Dr. [name]      02/07/2016 Pathologic Stage    p T4 N1b   2/43 nodes +      02/07/2016 Pathology Results    MMR normal; G2 adenocarcinoma;proximal & distal margins negative; soft tissue mass on pelvic sidewall + for adenocarcinoma with positive margin MSI Stable      03/08/2016 Imaging    CT chest negative for metastasis.        HISTORY OF PRESENTING ILLNESS:  Brittney Spencer 65 y.o. female is here because of her newly diagnsosed ascending colon cancer. She is accompanied by her daughter to my clinic  today.   She initially presented with vaginal discharge and gastric discomfot., and was seen by her GYN and underwent CT scan which showedA perforated appendicitis. She was treated nonoperatively with IV antibiotics with resolution of her symptoms. Posttreatment CT scan questioned  Some thickening of the cecum, question neoplasm. Colonoscopy was recommended and was delayed somewhat due to patient issues and revealed an obvious malignancy in the cecum with partial obstruction. Biopsy revealed adenocarcinoma. She was referred to Dr. [name] and underwent laparoscopic-assisted and possible open right hemicolectomy on 02/07/2016. Per the OR note, at laparoscopy there was a bulky somewhat fixed tumor in the cecum. There was found to be a perforated cancer with a chronic cavity invading into the posterior lateral retroperitoneum.  The cancer was very bulky and locally invasive but was grossly completely removed  With ileotransverse colostomy. Final pathology revealed a 5 cm tumor  With microscopically broadly positive lateral margin in the retroperitoneum (grossly negative by inspection of the time of surgery) and 2 out of 42 lymph nodes positive.Dr. [name] marked the retroperitoneum with clips for possible radiation therapy. Postoperative course was unremarkable.   She has been recovering well from surgery. She has minimum pain at the incision site, staples removed, no infection or other complications. Her appetite and energy level have much improved, although not back to normal yet. She lost about 10 lbs in the  past 5 month, but gained a few lbs lately.   CURRENT THERAPY: Concurrent chemoradiation with Xeloda 1500 mg twice daily, starting on 03/19/2016  INTERIM HISTORY: Brittney Spencer returns for follow up. She is scheduled to start chemotherapy and radiation tomorrow. She is eating very well, has good appetite and energy level, which are pre-much back to her normal level. She denies any significant pain, or other  discomfort.  MEDICAL HISTORY:  Past Medical History:  Diagnosis Date  . AAA (abdominal aortic aneurysm) ([facility])    infrarenal 4.1 cmper s-9-19 scan on chart  . Anemia   . Anxiety   . Colon cancer ([place])   . Hypertension   . Vitamin D deficiency     SURGICAL HISTORY: Past Surgical History:  Procedure Laterality Date  . COLONSCOPY  12/2015  . LAPAROSCOPIC RIGHT HEMI COLECTOMY Right 02/07/2016   Procedure: LAPAROSCOPIC ASSISTED RIGHT HEMI COLECTOMY AND RIGHT SALPINGO OOPHERECTOMY;  Surgeon: [name], MD;  Location: [facility];  Service: General;  Laterality: Right;    SOCIAL HISTORY: Social History   Social History  . Marital status: Widowed    Spouse name: N/A  . Number of children: N/A  . Years of education: N/A   Occupational History  . Not on file.   Social History Main Topics  . Smoking status: Never Smoker  . Smokeless tobacco: Never Used  . Alcohol use No  . Drug use: No  . Sexual activity: Yes    Birth control/ protection: None   Other Topics Concern  . Not on file   Social History Narrative  . No narrative on file   She is retired  She has one daughter   FAMILY HISTORY: Family History  Problem Relation Age of Onset  . Cancer Mother     lung cancer  . Cancer Maternal Grandfather     unknown cancer   . Colon cancer Neg Hx     ALLERGIES:  is allergic to fish allergy; peanut-containing drug products; soy allergy; and buspirone.  MEDICATIONS:  Current Outpatient Prescriptions  Medication Sig Dispense Refill  . acetaminophen (TYLENOL) 325 MG tablet Take 2 tablets (650 mg total) by mouth every 6 (six) hours as needed for mild pain, moderate pain, fever or headache. (Patient not taking: Reported on 01/05/2016)    . ALPRAZolam (XANAX) 0.25 MG tablet Take 0.25 mg by mouth daily as needed for anxiety.   3  . capecitabine (XELODA) 500 MG tablet Take 3 tablets (1,500 mg total) by mouth 2 (two) times daily after a meal. Take twice daily on the day of  radiation only 180 tablet 0  . Cholecalciferol (VITAMIN D-3) 1000 units CAPS Take 1,000 Units by mouth daily.    Marland Kitchen HYDROcodone-acetaminophen (NORCO/VICODIN) 5-325 MG tablet Take 1-2 tablets by mouth every 4 (four) hours as needed for moderate pain or severe pain. (Patient not taking: Reported on 02/27/2016) 30 tablet 0  . NORVASC 2.5 MG tablet Take 2.5 mg by mouth daily.    . ondansetron (ZOFRAN) 4 MG tablet Take 1 tablet (4 mg total) by mouth every 6 (six) hours as needed for nausea. (Patient not taking: Reported on 02/27/2016) 10 tablet 0  . oxyCODONE-acetaminophen (PERCOCET/ROXICET) 5-325 MG tablet Take 1-2 tablets by mouth every 4 (four) hours as needed for moderate pain. (Patient not taking: Reported on 01/05/2016) 40 tablet 0  . polyethylene glycol (MIRALAX / GLYCOLAX) packet 1 dose daily, you can buy this as any pharmacy without a prescription. (Patient taking differently: Take 17 g  by mouth daily as needed for mild constipation. ) 14 each 0  . Probiotic Product ([employer] PROBIOTIC PO) Take 1 capsule by mouth daily.     Marland Kitchen triamterene-hydrochlorothiazide (MAXZIDE-25) 37.5-25 MG tablet Take 0.5 tablets by mouth daily.  3   No current facility-administered medications for this visit.     REVIEW OF SYSTEMS:   Constitutional: Denies fevers, chills or abnormal night sweats Eyes: Denies blurriness of vision, double vision or watery eyes Ears, nose, mouth, throat, and face: Denies mucositis or sore throat Respiratory: Denies cough, dyspnea or wheezes Cardiovascular: Denies palpitation, chest discomfort or lower extremity swelling Gastrointestinal:  Denies nausea, heartburn or change in bowel habits Skin: Denies abnormal skin rashes Lymphatics: Denies new lymphadenopathy or easy bruising Neurological:Denies numbness, tingling or new weaknesses Behavioral/Psych: Mood is stable, no new changes  All other systems were reviewed with the patient and are negative.  PHYSICAL EXAMINATION: ECOG PERFORMANCE  STATUS: 0  Vitals:   03/18/16 1141  BP: 133/69  Pulse: 80  Resp: 18  Temp: 98.6 F (37 C)   Filed Weights   03/18/16 1141  Weight: 123 lb 6.4 oz (56 kg)    GENERAL:alert, no distress and comfortable SKIN: skin color, texture, turgor are normal, no rashes or significant lesions EYES: normal, conjunctiva are pink and non-injected, sclera clear OROPHARYNX:no exudate, no erythema and lips, buccal mucosa, and tongue normal  NECK: supple, thyroid normal size, non-tender, without nodularity LYMPH:  no palpable lymphadenopathy in the cervical, axillary or inguinal LUNGS: clear to auscultation and percussion with normal breathing effort HEART: regular rate & rhythm and no murmurs and no lower extremity edema ABDOMEN:abdomen soft, non-tender and normal bowel sounds, surgical wound has healed well. Musculoskeletal:no cyanosis of digits and no clubbing  PSYCH: alert & oriented x 3 with fluent speech NEURO: no focal motor/sensory deficits  LABORATORY DATA:  I have reviewed the data as listed CBC Latest Ref Rng & Units 03/18/2016 02/27/2016 02/10/2016  WBC 3.9 - 10.3 10e3/uL 4.0 4.2 5.5  Hemoglobin 11.6 - 15.9 g/dL 9.7(L) 9.5(L) 8.3(L)  Hematocrit 34.8 - 46.6 % 31.2(L) 30.1(L) 26.0(L)  Platelets 145 - 400 10e3/uL 212 343 281   CMP Latest Ref Rng & Units 03/18/2016 02/27/2016 02/09/2016  Glucose 70 - 140 mg/dl 88 94 95  BUN 7.0 - 26.0 mg/dL 15.5 10.6 5(L)  Creatinine 0.6 - 1.1 mg/dL 0.9 0.8 0.78  Sodium 136 - 145 mEq/L 139 140 137  Potassium 3.5 - 5.1 mEq/L 3.9 4.3 4.0  Chloride 101 - 111 mmol/L - - 106  CO2 22 - 29 mEq/L _0 Calcium 8.4 - 10.4 mg/dL 9.9 9.9 8.2(L)  Total Protein 6.4 - 8.3 g/dL 7.9 7.8 -  Total Bilirubin 0.20 - 1.20 mg/dL 0.47 <0.30 -  Alkaline Phos 40 - 150 U/L 74 67 -  AST 5 - 34 U/L 19 16 -  ALT 0 - 55 U/L 9 <9 -   Results for GLYNN, FREAS (MRN [id number]) as of 03/18/2016 07:12  Ref. Range 02/27/2016 16:15  Iron Latest Ref Range: 41 - 142 ug/dL 26 (L)    UIBC Latest Ref Range: 120 - 384 ug/dL 333  TIBC Latest Ref Range: 236 - 444 ug/dL 359  %SAT Latest Ref Range: 21 - 57 % 7 (L)  Ferritin Latest Ref Range: 9 - 269 ng/ml 29   Pathology report  Diagnosis 02/07/2016 1. Colon, segmental resection for tumor, right - INVASIVE MODERATELY DIFFERENTIATED ADENOCARCINOMA, SPANNING 5 CM IN GREATEST DIMENSION. - TUMOR ARISES  FROM THE CECUM AND INVADES THROUGH THE FULL THICKNESS OF THE BOWEL AND PERFORATES THE SEROSA. - PROXIMAL AND DISTAL MARGINS ARE NEGATIVE (PLEASE SEE SPECIMEN #2 FOR FINAL MARGIN STATUS). - TWO OF FORTY-THREE LYMPH NODES POSITIVE FOR METASTATIC COLORECTAL ADENOCARCINOMA (2/43). - SEE ONCOLOGY TEMPLATE. 2. Soft tissue mass, simple excision, pelvic side wall - POSITIVE FOR COLORECTAL ADENOCARCINOMA. - MARGIN IS BROADLY POSITIVE. 3. Adnexa - ovary +/- tube, neoplastic, right - OVARIAN FIBROMA WITH ASSOCIATED CALCIFICATIONS. - BENIGN FALLOPIAN TUBE - NO ATYPIA OR MALIGNANCY IDENTIFIED. - SEE COMMENT. Microscopic Comment 1. COLON AND RECTUM (INCLUDING TRANS-ANAL RESECTION): Specimen: Right partial colon with additional right pelvic sidewall tissue. Procedure: Right hemicolectomy with lateral pelvic sidewall excision. Tumor site: Cecum. Specimen integrity: Intact. Macroscopic intactness of mesorectum: Not applicable. Macroscopic tumor perforation: Yes, adenocarcinoma macroscopically perforates serosa. Invasive tumor: Maximum size: 5 cm. Histologic type(s): Adenocarcinoma. Histologic grade and differentiation G2: moderately differentiated/low grade. Type of polyp in which invasive carcinoma arose: A definitive precursor polyp is not identified. Microscopic extension of invasive tumor: Tumor invades through the entire colon and perforates the serosa. Lymph-Vascular invasion: Definitive lymph/vascular invasion is not identified, however there is lymph node positivity, see below Peri-neural invasion: Not identified. Tumor  deposit(s) (discontinuous extramural extension): No tumor deposits are identified. Resection margins: Proximal margin: Negative. Distal margin: Negative. Circumferential (radial) (posterior ascending, posterior descending; lateral and posterior mid-rectum; and entire lower 1/3 rectum): Tumor involves pelvic sidewall margin (positive margin), see below comment. Mesenteric margin (sigmoid and transverse): Not applicable. Distance closest margin (if all above margins negative): Tumor invades into pelvic sidewall with positive pelvic sidewall margin, see below comment. Trans-anal resection margins only: Deep margin: Not applicable. Mucosal Margin: Not applicable. Distance closest mucosal margin (if negative): Not applicable. Treatment effect (neo-adjuvant therapy): Not applicable. Additional polyp(s): The possible mucosal polyp seen grossly simply demonstrates benign polypoid colorectal mucosa without a true polyp identified. Non-neoplastic findings: No additional significant non-neoplastic findings. Lymph nodes: number examined 43; number positive: 2. Pathologic Staging: pT4b, pN1b, R2, see below comment. Ancillary studies: The tumor will be sent for MSI by PCR and MMR by [facility] per colorectal cancer protocol. Comments: As the tumor is perforated with invasion into the pelvic sidewall, this is assessed as representing direct extension into another structure and is therefore compatible with pT4b tumor. The margin as indicated by the surgeon of the pelvic sidewall is grossly positive on pathologic gross examination and shows broad positivity on microscopic examination. The findings are consistent with the above assessment. The case was discussed with Dr. [name] on 02/09/2016. 3. Of note, the right fallopian tube in the third specimen demonstrates findings suggestive of previous tubal ligation. Please correlate with surgical history. (RH:kh 02-09-16) Willeen Niece MD Pathologist, Electronic  Signature  1. Mismatch Repair (MMR) Protein Immunohistochemistry (IHC) IHC Expression Result: MLH1: Preserved nuclear expression (greater 50% tumor expression) MSH2: Preserved nuclear expression (greater 50% tumor expression) MSH6: Preserved nuclear expression (greater 50% tumor expression) PMS2: Preserved nuclear expression (greater 50% tumor expression) * Internal control demonstrates intact nuclear expression     RADIOGRAPHIC STUDIES: I have personally reviewed the radiological images as listed and agreed with the findings in the report. Ct Chest W Contrast  Result Date: 03/08/2016 CLINICAL DATA:  Stage IIIC cancer of the ascending colon, status post partial colectomy, imaging restaging prior to initiation of chemotherapy and radiation therapy. EXAM: CT CHEST, ABDOMEN, AND PELVIS WITH CONTRAST TECHNIQUE: Multidetector CT imaging of the chest, abdomen and pelvis was performed following the standard protocol during bolus  administration of intravenous contrast. CONTRAST:  160m ISOVUE-300 IOPAMIDOL (ISOVUE-300) INJECTION 61% COMPARISON:  10/20/2015 FINDINGS: CT CHEST FINDINGS Cardiovascular: Unremarkable Mediastinum/Nodes: 2.6 by 1.7 cm fluid density lesion just below the right inferior pulmonary vein on image 32/2, compatible with pericardial cyst. Small calcified left hilar lymph nodes. Lungs/Pleura: Biapical pleuroparenchymal scarring, right greater than left. 3 mm left upper lobe nodule, image 52/6. Linear cluster of nodules in the left upper lobe on images 52-53 of series 6 with some calcification compatible with old granulomatous disease. Musculoskeletal: Thoracic spondylosis. CT ABDOMEN PELVIS FINDINGS Hepatobiliary: Unremarkable Pancreas: Unremarkable Spleen: Old granulomatous disease.  Otherwise unremarkable. Adrenals/Urinary Tract: Non rotated right kidney. Benign-appearing right kidney lower pole cyst, 2.3 by 2.3 cm on image 71/2, previously 1.7 by 1.5 cm. Stomach/Bowel: Right  hemicolectomy. Mild prominence of stool in the distal colon. Vascular/Lymphatic: Previous large mesenteric lymph nodes are no longer present. No pathologic adenopathy. Minimal common iliac artery atherosclerotic calcification bilaterally. There is some scarring and trace fluid density along the inferior margin the right paracolic gutter but no residual mass density in this region. Reproductive: The prior calcified mass along the right adnexa is no longer seen, presumably this is a leiomyoma which was removed. Other: Laparotomy site generally unremarkable. Musculoskeletal: Lower lumbar degenerative facet arthropathy. Grade 1 anterolisthesis at L3-4 and L5-S1, degenerative in nature. IMPRESSION: 1. Right hemicolectomy with removal of the cecal mass, pericecal soft tissue density, and enlarged mesenteric lymph nodes. No recurrent malignancy is currently visible. 2. Calcified left hilar lymph node with some clustered small nodules in the left upper lobe, probably from old granulomatous disease, although may merit surveillance. 3. The prior calcified mass along the right adnexa is no longer present and was presumably a leiomyoma which is been removed. 4. Benign-appearing but enlarging right kidney lower pole cyst, 2.3 cm diameter currently. 5. Fluid density lesion below the right inferior pulmonary vein, probably a pericardial cyst. Electronically Signed   By: WVan ClinesM.D.   On: 03/08/2016 16:39   Ct Abdomen Pelvis W Contrast  Result Date: 03/08/2016 CLINICAL DATA:  Stage IIIC cancer of the ascending colon, status post partial colectomy, imaging restaging prior to initiation of chemotherapy and radiation therapy. EXAM: CT CHEST, ABDOMEN, AND PELVIS WITH CONTRAST TECHNIQUE: Multidetector CT imaging of the chest, abdomen and pelvis was performed following the standard protocol during bolus administration of intravenous contrast. CONTRAST:  1027mISOVUE-300 IOPAMIDOL (ISOVUE-300) INJECTION 61% COMPARISON:   10/20/2015 FINDINGS: CT CHEST FINDINGS Cardiovascular: Unremarkable Mediastinum/Nodes: 2.6 by 1.7 cm fluid density lesion just below the right inferior pulmonary vein on image 32/2, compatible with pericardial cyst. Small calcified left hilar lymph nodes. Lungs/Pleura: Biapical pleuroparenchymal scarring, right greater than left. 3 mm left upper lobe nodule, image 52/6. Linear cluster of nodules in the left upper lobe on images 52-53 of series 6 with some calcification compatible with old granulomatous disease. Musculoskeletal: Thoracic spondylosis. CT ABDOMEN PELVIS FINDINGS Hepatobiliary: Unremarkable Pancreas: Unremarkable Spleen: Old granulomatous disease.  Otherwise unremarkable. Adrenals/Urinary Tract: Non rotated right kidney. Benign-appearing right kidney lower pole cyst, 2.3 by 2.3 cm on image 71/2, previously 1.7 by 1.5 cm. Stomach/Bowel: Right hemicolectomy. Mild prominence of stool in the distal colon. Vascular/Lymphatic: Previous large mesenteric lymph nodes are no longer present. No pathologic adenopathy. Minimal common iliac artery atherosclerotic calcification bilaterally. There is some scarring and trace fluid density along the inferior margin the right paracolic gutter but no residual mass density in this region. Reproductive: The prior calcified mass along the right adnexa is no longer  seen, presumably this is a leiomyoma which was removed. Other: Laparotomy site generally unremarkable. Musculoskeletal: Lower lumbar degenerative facet arthropathy. Grade 1 anterolisthesis at L3-4 and L5-S1, degenerative in nature. IMPRESSION: 1. Right hemicolectomy with removal of the cecal mass, pericecal soft tissue density, and enlarged mesenteric lymph nodes. No recurrent malignancy is currently visible. 2. Calcified left hilar lymph node with some clustered small nodules in the left upper lobe, probably from old granulomatous disease, although may merit surveillance. 3. The prior calcified mass along the right  adnexa is no longer present and was presumably a leiomyoma which is been removed. 4. Benign-appearing but enlarging right kidney lower pole cyst, 2.3 cm diameter currently. 5. Fluid density lesion below the right inferior pulmonary vein, probably a pericardial cyst. Electronically Signed   By: Van Clines M.D.   On: 03/08/2016 16:39    ASSESSMENT & PLAN:  65 year old female, with past medical history of hypertension and abdominal aorta aneurysm, presented with perforation right cecum colon cancer.  1. Cancer of ascending colon, pT4bN1bM0, stage IIIC, MSI-stable, (+) surgical margins at pelvic wall  -I discussed her surgical pathology report and prior CT scan findings in great details with patient and her daughter.  -I reviewed her staging CT chest, which was negative for metastasis. --Due to the locally advanced disease, perforated tumor, and positive surgical margins, she is at extremely high risk for cancer recurrence. -Her case was discussed in our tumor board, we recommend adjuvant chemotherapy and irradiation -She is scheduled to start concurrent chemotherapy and radiation with Xeloda tomorrow. -Potential side effects from Xeloda were reviewed with her again, especially neutropenic precaution, nausea, fatigue, and hand-foot syndrome -Zofran was called in, she knows to use as needed - lab every week, I'll see her every 1-2 weeks during her radiation and chemotherapy   2. HTN -She'll follow-up with her primary care physician and continue medication -we discussed that chemotherapy may affect her blood pressure, we'll monitor her pressures closely  3. Anemia -Her anemia is related to her surgery and iron deficiency. Iron studies showed evidence of iron deficiency. -She previously took iron pill before, and had some constipation. She is agreeable to restart ferrous sulfate 1-2 tablets a day. Constipation management reviewed with her. I encouraged her to take the iron pill with orange  juice oh vitamin C to improve the absorption -If her iron deficiency does not improve before she starts CAPOX, I'll consider IV Feraheme  Plan -start radiation and xeloda 1550m bid tomorrow -Lab every Tuesday, I'll see her back next Tuesday.  All questions were answered. The patient knows to call the clinic with any problems, questions or concerns. I spent 25 minutes counseling the patient face to face. The total time spent in the appointment was 30 minutes and more than 50% was on counseling.     FTruitt Merle MD 03/18/2016

## 2016-03-18 NOTE — Telephone Encounter (Signed)
Avs report and appointment schedule given to patient, per 03/18/16 los. °

## 2016-03-18 NOTE — Telephone Encounter (Signed)
Oncology Nurse Navigator Documentation  Oncology Nurse Navigator Flowsheets 03/18/2016  Navigator Location [facility]  Navigator Encounter Type Telephone  Telephone Incoming Call;Appt Confirmation/Clarification--very confused due to [facility] showing appointments on [facility] 1 and Linac4 with different times. Asking what is correct. After confirmation with rad onc, deleted all appointments on machine #1 and went over her schedule with her noting dates/times she has labs and sees MD. Informed her that rad onc will give her a new schedule tomorrow at 5pm and review with her.  Abnormal Finding Date -  Confirmed Diagnosis Date -  Surgery Date -  Patient Visit Type -  Treatment Phase -  Barriers/Navigation Needs -  Education -  Interventions -  Coordination of Care -  Education Method -  Support Groups/Services -  Acuity -  Time Spent with Patient 15

## 2016-03-19 ENCOUNTER — Ambulatory Visit: Payer: Federal, State, Local not specified - PPO

## 2016-03-19 ENCOUNTER — Ambulatory Visit
Admission: RE | Admit: 2016-03-19 | Discharge: 2016-03-19 | Disposition: A | Discharge status: Home / Self Care | Payer: Federal, State, Local not specified - PPO | Source: Ambulatory Visit | Attending: Radiation Oncology | Admitting: Radiation Oncology

## 2016-03-19 DIAGNOSIS — Z510 Encounter for antineoplastic radiation therapy: Secondary | ICD-10-CM | POA: Diagnosis not present

## 2016-03-20 ENCOUNTER — Ambulatory Visit: Payer: Federal, State, Local not specified - PPO

## 2016-03-20 ENCOUNTER — Ambulatory Visit
Admission: RE | Admit: 2016-03-20 | Discharge: 2016-03-20 | Disposition: A | Discharge status: Home / Self Care | Payer: Federal, State, Local not specified - PPO | Source: Ambulatory Visit | Attending: Radiation Oncology | Admitting: Radiation Oncology

## 2016-03-20 ENCOUNTER — Telehealth: Payer: Self-pay | Admitting: Pharmacist

## 2016-03-20 DIAGNOSIS — Z510 Encounter for antineoplastic radiation therapy: Secondary | ICD-10-CM | POA: Diagnosis not present

## 2016-03-20 NOTE — Telephone Encounter (Signed)
Oral Chemotherapy Pharmacist Encounter   I spoke with patient for overview of new oral chemotherapy medication: Xeloda.  Pt is doing well so far.   Start date: 03/19/16  Counseled patient on administration, dosing, side effects, safe handling, and monitoring. Pt counseled to take 1500mg  (3 tabs) in the morning and in the evening after a meal on days of radiation only. Side effects include but not limited to: N/V/D, fatigue, abdominal pain, hand-foot sunfrome.  Patient voiced understanding and appreciation.   All questions answered.  Will follow up in 1-2 weeks for adherence and toxicity management.   Thank you,  Johny Drilling, PharmD, BCPS 03/20/2016  11:45 AM Oral Chemotherapy Clinic [phone]

## 2016-03-21 ENCOUNTER — Ambulatory Visit
Admission: RE | Admit: 2016-03-21 | Discharge: 2016-03-21 | Disposition: A | Discharge status: Home / Self Care | Payer: Federal, State, Local not specified - PPO | Source: Ambulatory Visit | Attending: Radiation Oncology | Admitting: Radiation Oncology

## 2016-03-21 ENCOUNTER — Ambulatory Visit: Payer: Federal, State, Local not specified - PPO

## 2016-03-21 ENCOUNTER — Ambulatory Visit: Admission: RE | Admit: 2016-03-21 | Payer: Federal, State, Local not specified - PPO | Source: Ambulatory Visit

## 2016-03-21 DIAGNOSIS — Z510 Encounter for antineoplastic radiation therapy: Secondary | ICD-10-CM | POA: Diagnosis not present

## 2016-03-22 ENCOUNTER — Ambulatory Visit
Admission: RE | Admit: 2016-03-22 | Discharge: 2016-03-22 | Disposition: A | Discharge status: Home / Self Care | Payer: Federal, State, Local not specified - PPO | Source: Ambulatory Visit | Attending: Radiation Oncology | Admitting: Radiation Oncology

## 2016-03-22 ENCOUNTER — Ambulatory Visit: Payer: Federal, State, Local not specified - PPO

## 2016-03-22 ENCOUNTER — Ambulatory Visit
Admission: RE | Admit: 2016-03-22 | Discharge: 2016-03-22 | Disposition: A | Discharge status: Home / Self Care | Payer: Medicare Other | Source: Ambulatory Visit | Attending: Radiation Oncology | Admitting: Radiation Oncology

## 2016-03-22 DIAGNOSIS — Z510 Encounter for antineoplastic radiation therapy: Secondary | ICD-10-CM | POA: Diagnosis not present

## 2016-03-22 DIAGNOSIS — C182 Malignant neoplasm of ascending colon: Secondary | ICD-10-CM

## 2016-03-22 NOTE — Progress Notes (Signed)
Pt here for patient teaching.  Pt given Radiation and You booklet. Reviewed areas of pertinence such as diarrhea, fatigue, nausea and vomiting, skin changes and urinary and bladder changes . Pt able to give teach back of to pat skin, use unscented/gentle soap, use baby wipes, have Imodium on hand and drink plenty of water,avoid applying anything to skin within 4 hours of treatment. Pt demonstrated understanding and verbalizes understanding of information given and will contact nursing with any questions or concerns.

## 2016-03-23 NOTE — Progress Notes (Signed)
   Department of Radiation Oncology  Phone:  [phone] Fax:        [phone]  Weekly Treatment Note    Name: Brittney Spencer Date: 03/23/2016 MRN: [id number] DOB: Jul 22, 1950   Diagnosis:     ICD-9-CM ICD-10-CM   1. Cancer of ascending colon (HCC) 153.6 C18.2      Current dose: 7.2 Gy  Current fraction: 4   MEDICATIONS: Current Outpatient Prescriptions  Medication Sig Dispense Refill  . acetaminophen (TYLENOL) 325 MG tablet Take 2 tablets (650 mg total) by mouth every 6 (six) hours as needed for mild pain, moderate pain, fever or headache. (Patient not taking: Reported on 01/05/2016)    . ALPRAZolam (XANAX) 0.25 MG tablet Take 0.25 mg by mouth daily as needed for anxiety.   3  . capecitabine (XELODA) 500 MG tablet Take 3 tablets (1,500 mg total) by mouth 2 (two) times daily after a meal. Take twice daily on the day of radiation only 180 tablet 0  . Cholecalciferol (VITAMIN D-3) 1000 units CAPS Take 1,000 Units by mouth daily.    Marland Kitchen HYDROcodone-acetaminophen (NORCO/VICODIN) 5-325 MG tablet Take 1-2 tablets by mouth every 4 (four) hours as needed for moderate pain or severe pain. (Patient not taking: Reported on 02/27/2016) 30 tablet 0  . NORVASC 2.5 MG tablet Take 2.5 mg by mouth daily.    . ondansetron (ZOFRAN) 4 MG tablet Take 1 tablet (4 mg total) by mouth every 6 (six) hours as needed for nausea. (Patient not taking: Reported on 02/27/2016) 10 tablet 0  . oxyCODONE-acetaminophen (PERCOCET/ROXICET) 5-325 MG tablet Take 1-2 tablets by mouth every 4 (four) hours as needed for moderate pain. (Patient not taking: Reported on 01/05/2016) 40 tablet 0  . polyethylene glycol (MIRALAX / GLYCOLAX) packet 1 dose daily, you can buy this as any pharmacy without a prescription. (Patient taking differently: Take 17 g by mouth daily as needed for mild constipation. ) 14 each 0  . Probiotic Product ([employer] PROBIOTIC PO) Take 1 capsule by mouth daily.     Marland Kitchen triamterene-hydrochlorothiazide  (MAXZIDE-25) 37.5-25 MG tablet Take 0.5 tablets by mouth daily.  3   No current facility-administered medications for this encounter.      ALLERGIES: Fish allergy; Peanut-containing drug products; Soy allergy; and Buspirone   LABORATORY DATA:  Lab Results  Component Value Date   WBC 4.0 03/18/2016   HGB 9.7 (L) 03/18/2016   HCT 31.2 (L) 03/18/2016   MCV 76.7 (L) 03/18/2016   PLT 212 03/18/2016   Lab Results  Component Value Date   NA 139 03/18/2016   K 3.9 03/18/2016   CL 106 02/09/2016   CO2 28 03/18/2016   Lab Results  Component Value Date   ALT 9 03/18/2016   AST 19 03/18/2016   ALKPHOS 74 03/18/2016   BILITOT 0.47 03/18/2016     NARRATIVE: Brittney Spencer was seen today for weekly treatment management. The chart was checked and the patient's films were reviewed.  The patient states that she is doing very well. One episode of nausea but no major difficulties otherwise. This is well controlled currently with medication.  PHYSICAL EXAMINATION: vitals were not taken for this visit.     Alert, no acute distress.  ASSESSMENT: The patient is doing satisfactorily with treatment.  PLAN: We will continue with the patient's radiation treatment as planned.

## 2016-03-25 ENCOUNTER — Ambulatory Visit: Payer: Federal, State, Local not specified - PPO

## 2016-03-25 ENCOUNTER — Ambulatory Visit
Admission: RE | Admit: 2016-03-25 | Discharge: 2016-03-25 | Disposition: A | Discharge status: Home / Self Care | Payer: Federal, State, Local not specified - PPO | Source: Ambulatory Visit | Attending: Radiation Oncology | Admitting: Radiation Oncology

## 2016-03-25 DIAGNOSIS — Z510 Encounter for antineoplastic radiation therapy: Secondary | ICD-10-CM | POA: Diagnosis not present

## 2016-03-26 ENCOUNTER — Ambulatory Visit
Admission: RE | Admit: 2016-03-26 | Discharge: 2016-03-26 | Disposition: A | Discharge status: Home / Self Care | Payer: Federal, State, Local not specified - PPO | Source: Ambulatory Visit | Attending: Radiation Oncology | Admitting: Radiation Oncology

## 2016-03-26 ENCOUNTER — Other Ambulatory Visit (HOSPITAL_BASED_OUTPATIENT_CLINIC_OR_DEPARTMENT_OTHER): Payer: Federal, State, Local not specified - PPO

## 2016-03-26 ENCOUNTER — Ambulatory Visit (HOSPITAL_BASED_OUTPATIENT_CLINIC_OR_DEPARTMENT_OTHER): Payer: Federal, State, Local not specified - PPO | Admitting: Hematology

## 2016-03-26 ENCOUNTER — Encounter: Payer: Self-pay | Admitting: Hematology

## 2016-03-26 ENCOUNTER — Ambulatory Visit: Payer: Federal, State, Local not specified - PPO

## 2016-03-26 VITALS — BP 105/62 | HR 102 | Temp 98.2°F | Resp 18 | Ht 65.0 in | Wt 120.0 lb

## 2016-03-26 DIAGNOSIS — Z510 Encounter for antineoplastic radiation therapy: Secondary | ICD-10-CM | POA: Diagnosis not present

## 2016-03-26 DIAGNOSIS — D509 Iron deficiency anemia, unspecified: Secondary | ICD-10-CM | POA: Diagnosis not present

## 2016-03-26 DIAGNOSIS — I10 Essential (primary) hypertension: Secondary | ICD-10-CM | POA: Diagnosis not present

## 2016-03-26 DIAGNOSIS — D500 Iron deficiency anemia secondary to blood loss (chronic): Secondary | ICD-10-CM | POA: Insufficient documentation

## 2016-03-26 DIAGNOSIS — C182 Malignant neoplasm of ascending colon: Secondary | ICD-10-CM

## 2016-03-26 LAB — CBC & DIFF AND RETIC
BASO%: 0.5 % (ref 0.0–2.0)
Basophils Absolute: 0 10*3/uL (ref 0.0–0.1)
EOS ABS: 0 10*3/uL (ref 0.0–0.5)
EOS%: 1.5 % (ref 0.0–7.0)
HCT: 30.1 % — ABNORMAL LOW (ref 34.8–46.6)
HGB: 9.5 g/dL — ABNORMAL LOW (ref 11.6–15.9)
IMMATURE RETIC FRACT: 9.9 % (ref 1.60–10.00)
LYMPH#: 0.7 10*3/uL — AB (ref 0.9–3.3)
LYMPH%: 35.5 % (ref 14.0–49.7)
MCH: 24 pg — AB (ref 25.1–34.0)
MCHC: 31.5 g/dL (ref 31.5–36.0)
MCV: 76.1 fL — AB (ref 79.5–101.0)
MONO#: 0.3 10*3/uL (ref 0.1–0.9)
MONO%: 14.8 % — AB (ref 0.0–14.0)
NEUT%: 47.7 % (ref 38.4–76.8)
NEUTROS ABS: 0.9 10*3/uL — AB (ref 1.5–6.5)
NRBC: 0 % (ref 0–0)
Platelets: 233 10*3/uL (ref 145–400)
RBC: 3.95 10*6/uL (ref 3.70–5.45)
RDW: 19.3 % — AB (ref 11.2–14.5)
RETIC %: 0.62 % — AB (ref 0.70–2.10)
Retic Ct Abs: 24.49 10*3/uL — ABNORMAL LOW (ref 33.70–90.70)
WBC: 1.9 10*3/uL — AB (ref 3.9–10.3)

## 2016-03-26 LAB — COMPREHENSIVE METABOLIC PANEL
ALK PHOS: 59 U/L (ref 40–150)
ALT: 10 U/L (ref 0–55)
AST: 16 U/L (ref 5–34)
Albumin: 3.7 g/dL (ref 3.5–5.0)
Anion Gap: 11 mEq/L (ref 3–11)
BILIRUBIN TOTAL: 0.58 mg/dL (ref 0.20–1.20)
BUN: 12.5 mg/dL (ref 7.0–26.0)
CO2: 26 mEq/L (ref 22–29)
CREATININE: 0.9 mg/dL (ref 0.6–1.1)
Calcium: 9.6 mg/dL (ref 8.4–10.4)
Chloride: 99 mEq/L (ref 98–109)
EGFR: 75 mL/min/{1.73_m2} — AB (ref >90)
Glucose: 97 mg/dl (ref 70–140)
POTASSIUM: 3.5 meq/L (ref 3.5–5.1)
SODIUM: 137 meq/L (ref 136–145)
Total Protein: 7.6 g/dL (ref 6.4–8.3)

## 2016-03-26 LAB — FERRITIN: FERRITIN: 41 ng/mL (ref 9–269)

## 2016-03-26 NOTE — Progress Notes (Signed)
[place]  Telephone:(336) [phone] Fax:([phone]) [phone]  Clinic Follow Up Note   Patient Care Team: Pcp Not In System as PCP - General 03/26/2016  CHIEF COMPLAINTS:  Follow up stage IIIC right colon cancer   Oncology History   Cancer of ascending colon [facility])   Staging form: Colon and Rectum, AJCC 7th Edition   - Clinical stage from 02/07/2016: Stage IIIC (T4b, N1b, M0) - Signed by [name], MD on 03/04/2016      Cancer of ascending colon ([place])   10/25/2015 Imaging    CT ABD/PELVIS:  Inflammatory changes inferior to the cecal tip appear improved, there is still irregular soft tissue thickening of the cecal tip, and there are adjacent prominent lymph nodes in the ileocolonic mesentery, measuring 13 mm on image 49 and 8 mm on image 52. In addition, there is a 2.5 x 1.8 cm nodule on image 46 which has central low density. Therefore, these findings are moderately suspicious for an underlying cecal malignancy with perforation.       01/19/2016 Procedure    COLONOSCOPY per Dr. [name]: Fungating, ulcerated mass almost obstructing mid ascending colon      02/05/2016 Tumor Marker    Patient's tumor was tested for the following markers: CEA Results of the tumor marker test revealed 5.7.      02/07/2016 Initial Diagnosis    Cancer of ascending colon ([place])     02/07/2016 Definitive Surgery    Laparoscopic assisted right hemicolectomy and right salpingo oopherectomy--Dr. [name]      02/07/2016 Pathologic Stage    p T4 N1b   2/43 nodes +      02/07/2016 Pathology Results    MMR normal; G2 adenocarcinoma;proximal & distal margins negative; soft tissue mass on pelvic sidewall + for adenocarcinoma with positive margin MSI Stable      03/08/2016 Imaging    CT chest negative for metastasis.        HISTORY OF PRESENTING ILLNESS:  Brittney Spencer 65 y.o. female is here because of her newly diagnsosed ascending colon cancer. She is accompanied by her daughter to my clinic  today.   She initially presented with vaginal discharge and gastric discomfot., and was seen by her GYN and underwent CT scan which showedA perforated appendicitis. She was treated nonoperatively with IV antibiotics with resolution of her symptoms. Posttreatment CT scan questioned  Some thickening of the cecum, question neoplasm. Colonoscopy was recommended and was delayed somewhat due to patient issues and revealed an obvious malignancy in the cecum with partial obstruction. Biopsy revealed adenocarcinoma. She was referred to Dr. [name] and underwent laparoscopic-assisted and possible open right hemicolectomy on 02/07/2016. Per the OR note, at laparoscopy there was a bulky somewhat fixed tumor in the cecum. There was found to be a perforated cancer with a chronic cavity invading into the posterior lateral retroperitoneum.  The cancer was very bulky and locally invasive but was grossly completely removed  With ileotransverse colostomy. Final pathology revealed a 5 cm tumor  With microscopically broadly positive lateral margin in the retroperitoneum (grossly negative by inspection of the time of surgery) and 2 out of 42 lymph nodes positive.Dr. [name] marked the retroperitoneum with clips for possible radiation therapy. Postoperative course was unremarkable.   She has been recovering well from surgery. She has minimum pain at the incision site, staples removed, no infection or other complications. Her appetite and energy level have much improved, although not back to normal yet. She lost about 10 lbs in the  past 5 month, but gained a few lbs lately.   CURRENT THERAPY: Concurrent chemoradiation with Xeloda 1500 mg twice daily, starting on 03/19/2016  INTERIM HISTORY: Ms shane returns for follow up. She started chemotherapy and radiation 1 week ago, she has been tolerating moderately well so far. Her main complaint is fatigue, she is able to function, but doesn't feel the energy to do other activities. She  also has mild light headedness, no fall or balance issue. She had one episode of mild nausea, took Zofran. No significant diarrhea, pain, or other side effects. No fever or chills.  MEDICAL HISTORY:  Past Medical History:  Diagnosis Date  . AAA (abdominal aortic aneurysm) ([facility])    infrarenal 4.1 cmper s-9-19 scan on chart  . Anemia   . Anxiety   . Colon cancer ([place])   . Hypertension   . Vitamin D deficiency     SURGICAL HISTORY: Past Surgical History:  Procedure Laterality Date  . COLONSCOPY  12/2015  . LAPAROSCOPIC RIGHT HEMI COLECTOMY Right 02/07/2016   Procedure: LAPAROSCOPIC ASSISTED RIGHT HEMI COLECTOMY AND RIGHT SALPINGO OOPHERECTOMY;  Surgeon: [name], MD;  Location: [facility];  Service: General;  Laterality: Right;    SOCIAL HISTORY: Social History   Social History  . Marital status: Widowed    Spouse name: N/A  . Number of children: N/A  . Years of education: N/A   Occupational History  . Not on file.   Social History Main Topics  . Smoking status: Never Smoker  . Smokeless tobacco: Never Used  . Alcohol use No  . Drug use: No  . Sexual activity: Yes    Birth control/ protection: None   Other Topics Concern  . Not on file   Social History Narrative  . No narrative on file   She is retired  She has one daughter   FAMILY HISTORY: Family History  Problem Relation Age of Onset  . Cancer Mother     lung cancer  . Cancer Maternal Grandfather     unknown cancer   . Colon cancer Neg Hx     ALLERGIES:  is allergic to fish allergy; peanut-containing drug products; soy allergy; and buspirone.  MEDICATIONS:  Current Outpatient Prescriptions  Medication Sig Dispense Refill  . capecitabine (XELODA) 500 MG tablet Take 3 tablets (1,500 mg total) by mouth 2 (two) times daily after a meal. Take twice daily on the day of radiation only 180 tablet 0  . NORVASC 2.5 MG tablet Take 2.5 mg by mouth daily.    . polyethylene glycol (MIRALAX / GLYCOLAX) packet 1  dose daily, you can buy this as any pharmacy without a prescription. (Patient taking differently: Take 17 g by mouth daily as needed for mild constipation. ) 14 each 0  . Probiotic Product ([employer] PROBIOTIC PO) Take 1 capsule by mouth daily.     Marland Kitchen triamterene-hydrochlorothiazide (MAXZIDE-25) 37.5-25 MG tablet Take 0.5 tablets by mouth daily.  3  . acetaminophen (TYLENOL) 325 MG tablet Take 2 tablets (650 mg total) by mouth every 6 (six) hours as needed for mild pain, moderate pain, fever or headache. (Patient not taking: Reported on 03/26/2016)    . ALPRAZolam (XANAX) 0.25 MG tablet Take 0.25 mg by mouth daily as needed for anxiety.   3  . Cholecalciferol (VITAMIN D-3) 1000 units CAPS Take 1,000 Units by mouth daily.    Marland Kitchen HYDROcodone-acetaminophen (NORCO/VICODIN) 5-325 MG tablet Take 1-2 tablets by mouth every 4 (four) hours as needed for moderate pain  or severe pain. (Patient not taking: Reported on 03/26/2016) 30 tablet 0  . ondansetron (ZOFRAN) 4 MG tablet Take 1 tablet (4 mg total) by mouth every 6 (six) hours as needed for nausea. (Patient not taking: Reported on 03/26/2016) 10 tablet 0  . oxyCODONE-acetaminophen (PERCOCET/ROXICET) 5-325 MG tablet Take 1-2 tablets by mouth every 4 (four) hours as needed for moderate pain. (Patient not taking: Reported on 03/26/2016) 40 tablet 0   No current facility-administered medications for this visit.     REVIEW OF SYSTEMS:   Constitutional: Denies fevers, chills or abnormal night sweats Eyes: Denies blurriness of vision, double vision or watery eyes Ears, nose, mouth, throat, and face: Denies mucositis or sore throat Respiratory: Denies cough, dyspnea or wheezes Cardiovascular: Denies palpitation, chest discomfort or lower extremity swelling Gastrointestinal:  Denies nausea, heartburn or change in bowel habits Skin: Denies abnormal skin rashes Lymphatics: Denies new lymphadenopathy or easy bruising Neurological:Denies numbness, tingling or new  weaknesses Behavioral/Psych: Mood is stable, no new changes  All other systems were reviewed with the patient and are negative.  PHYSICAL EXAMINATION: ECOG PERFORMANCE STATUS: 2  Vitals:   03/26/16 1019  BP: 105/62  Pulse: (!) 102  Resp: 18  Temp: 98.2 F (36.8 C)   Filed Weights   03/26/16 1019  Weight: 120 lb (54.4 kg)    GENERAL:alert, no distress and comfortable SKIN: skin color, texture, turgor are normal, no rashes or significant lesions EYES: normal, conjunctiva are pink and non-injected, sclera clear OROPHARYNX:no exudate, no erythema and lips, buccal mucosa, and tongue normal  NECK: supple, thyroid normal size, non-tender, without nodularity LYMPH:  no palpable lymphadenopathy in the cervical, axillary or inguinal LUNGS: clear to auscultation and percussion with normal breathing effort HEART: regular rate & rhythm and no murmurs and no lower extremity edema ABDOMEN:abdomen soft, non-tender and normal bowel sounds, surgical wound has healed well. Musculoskeletal:no cyanosis of digits and no clubbing  PSYCH: alert & oriented x 3 with fluent speech NEURO: no focal motor/sensory deficits  LABORATORY DATA:  I have reviewed the data as listed CBC Latest Ref Rng & Units 03/26/2016 03/18/2016 02/27/2016  WBC 3.9 - 10.3 10e3/uL 1.9(L) 4.0 4.2  Hemoglobin 11.6 - 15.9 g/dL 9.5(L) 9.7(L) 9.5(L)  Hematocrit 34.8 - 46.6 % 30.1(L) 31.2(L) 30.1(L)  Platelets 145 - 400 10e3/uL 233 212 343   CMP Latest Ref Rng & Units 03/26/2016 03/18/2016 02/27/2016  Glucose 70 - 140 mg/dl 97 88 94  BUN 7.0 - 26.0 mg/dL 12.5 15.5 10.6  Creatinine 0.6 - 1.1 mg/dL 0.9 0.9 0.8  Sodium 136 - 145 mEq/L 137 139 140  Potassium 3.5 - 5.1 mEq/L 3.5 3.9 4.3  Chloride 101 - 111 mmol/L - - -  CO2 22 - 29 mEq/L _0 Calcium 8.4 - 10.4 mg/dL 9.6 9.9 9.9  Total Protein 6.4 - 8.3 g/dL 7.6 7.9 7.8  Total Bilirubin 0.20 - 1.20 mg/dL 0.58 0.47 <0.30  Alkaline Phos 40 - 150 U/L 59 74 67  AST 5 - 34 U/L _1 ALT 0 - 55 U/L 10 9 <9   Results for ASTA, CORBRIDGE (MRN [id number]) as of 03/18/2016 07:12  Ref. Range 02/27/2016 16:15  Iron Latest Ref Range: 41 - 142 ug/dL 26 (L)  UIBC Latest Ref Range: 120 - 384 ug/dL 333  TIBC Latest Ref Range: 236 - 444 ug/dL 359  %SAT Latest Ref Range: 21 - 57 % 7 (L)  Ferritin Latest Ref Range: 9 - 269 ng/ml  29   Pathology report  Diagnosis 02/07/2016 1. Colon, segmental resection for tumor, right - INVASIVE MODERATELY DIFFERENTIATED ADENOCARCINOMA, SPANNING 5 CM IN GREATEST DIMENSION. - TUMOR ARISES FROM THE CECUM AND INVADES THROUGH THE FULL THICKNESS OF THE BOWEL AND PERFORATES THE SEROSA. - PROXIMAL AND DISTAL MARGINS ARE NEGATIVE (PLEASE SEE SPECIMEN #2 FOR FINAL MARGIN STATUS). - TWO OF FORTY-THREE LYMPH NODES POSITIVE FOR METASTATIC COLORECTAL ADENOCARCINOMA (2/43). - SEE ONCOLOGY TEMPLATE. 2. Soft tissue mass, simple excision, pelvic side wall - POSITIVE FOR COLORECTAL ADENOCARCINOMA. - MARGIN IS BROADLY POSITIVE. 3. Adnexa - ovary +/- tube, neoplastic, right - OVARIAN FIBROMA WITH ASSOCIATED CALCIFICATIONS. - BENIGN FALLOPIAN TUBE - NO ATYPIA OR MALIGNANCY IDENTIFIED. - SEE COMMENT. Microscopic Comment 1. COLON AND RECTUM (INCLUDING TRANS-ANAL RESECTION): Specimen: Right partial colon with additional right pelvic sidewall tissue. Procedure: Right hemicolectomy with lateral pelvic sidewall excision. Tumor site: Cecum. Specimen integrity: Intact. Macroscopic intactness of mesorectum: Not applicable. Macroscopic tumor perforation: Yes, adenocarcinoma macroscopically perforates serosa. Invasive tumor: Maximum size: 5 cm. Histologic type(s): Adenocarcinoma. Histologic grade and differentiation G2: moderately differentiated/low grade. Type of polyp in which invasive carcinoma arose: A definitive precursor polyp is not identified. Microscopic extension of invasive tumor: Tumor invades through the entire colon and perforates the  serosa. Lymph-Vascular invasion: Definitive lymph/vascular invasion is not identified, however there is lymph node positivity, see below Peri-neural invasion: Not identified. Tumor deposit(s) (discontinuous extramural extension): No tumor deposits are identified. Resection margins: Proximal margin: Negative. Distal margin: Negative. Circumferential (radial) (posterior ascending, posterior descending; lateral and posterior mid-rectum; and entire lower 1/3 rectum): Tumor involves pelvic sidewall margin (positive margin), see below comment. Mesenteric margin (sigmoid and transverse): Not applicable. Distance closest margin (if all above margins negative): Tumor invades into pelvic sidewall with positive pelvic sidewall margin, see below comment. Trans-anal resection margins only: Deep margin: Not applicable. Mucosal Margin: Not applicable. Distance closest mucosal margin (if negative): Not applicable. Treatment effect (neo-adjuvant therapy): Not applicable. Additional polyp(s): The possible mucosal polyp seen grossly simply demonstrates benign polypoid colorectal mucosa without a true polyp identified. Non-neoplastic findings: No additional significant non-neoplastic findings. Lymph nodes: number examined 43; number positive: 2. Pathologic Staging: pT4b, pN1b, R2, see below comment. Ancillary studies: The tumor will be sent for MSI by PCR and MMR by [facility], [place] per colorectal cancer protocol. Comments: As the tumor is perforated with invasion into the pelvic sidewall, this is assessed as representing direct extension into another structure and is therefore compatible with pT4b tumor. The margin as indicated by the surgeon of the pelvic sidewall is grossly positive on pathologic gross examination and shows broad positivity on microscopic examination. The findings are consistent with the above assessment. The case was discussed with Dr. [name] on 02/09/2016. 3. Of note, the right fallopian tube in  the third specimen demonstrates findings suggestive of previous tubal ligation. Please correlate with surgical history. (RH:kh 02-09-16) Willeen Niece MD Pathologist, Electronic Signature  1. Mismatch Repair (MMR) Protein Immunohistochemistry (IHC) IHC Expression Result: MLH1: Preserved nuclear expression (greater 50% tumor expression) MSH2: Preserved nuclear expression (greater 50% tumor expression) MSH6: Preserved nuclear expression (greater 50% tumor expression) PMS2: Preserved nuclear expression (greater 50% tumor expression) * Internal control demonstrates intact nuclear expression     RADIOGRAPHIC STUDIES: I have personally reviewed the radiological images as listed and agreed with the findings in the report. Ct Chest W Contrast  Result Date: 03/08/2016 CLINICAL DATA:  Stage IIIC cancer of the ascending colon, status post partial colectomy, imaging restaging prior to initiation of chemotherapy  and radiation therapy. EXAM: CT CHEST, ABDOMEN, AND PELVIS WITH CONTRAST TECHNIQUE: Multidetector CT imaging of the chest, abdomen and pelvis was performed following the standard protocol during bolus administration of intravenous contrast. CONTRAST:  174m ISOVUE-300 IOPAMIDOL (ISOVUE-300) INJECTION 61% COMPARISON:  10/20/2015 FINDINGS: CT CHEST FINDINGS Cardiovascular: Unremarkable Mediastinum/Nodes: 2.6 by 1.7 cm fluid density lesion just below the right inferior pulmonary vein on image 32/2, compatible with pericardial cyst. Small calcified left hilar lymph nodes. Lungs/Pleura: Biapical pleuroparenchymal scarring, right greater than left. 3 mm left upper lobe nodule, image 52/6. Linear cluster of nodules in the left upper lobe on images 52-53 of series 6 with some calcification compatible with old granulomatous disease. Musculoskeletal: Thoracic spondylosis. CT ABDOMEN PELVIS FINDINGS Hepatobiliary: Unremarkable Pancreas: Unremarkable Spleen: Old granulomatous disease.  Otherwise unremarkable.  Adrenals/Urinary Tract: Non rotated right kidney. Benign-appearing right kidney lower pole cyst, 2.3 by 2.3 cm on image 71/2, previously 1.7 by 1.5 cm. Stomach/Bowel: Right hemicolectomy. Mild prominence of stool in the distal colon. Vascular/Lymphatic: Previous large mesenteric lymph nodes are no longer present. No pathologic adenopathy. Minimal common iliac artery atherosclerotic calcification bilaterally. There is some scarring and trace fluid density along the inferior margin the right paracolic gutter but no residual mass density in this region. Reproductive: The prior calcified mass along the right adnexa is no longer seen, presumably this is a leiomyoma which was removed. Other: Laparotomy site generally unremarkable. Musculoskeletal: Lower lumbar degenerative facet arthropathy. Grade 1 anterolisthesis at L3-4 and L5-S1, degenerative in nature. IMPRESSION: 1. Right hemicolectomy with removal of the cecal mass, pericecal soft tissue density, and enlarged mesenteric lymph nodes. No recurrent malignancy is currently visible. 2. Calcified left hilar lymph node with some clustered small nodules in the left upper lobe, probably from old granulomatous disease, although may merit surveillance. 3. The prior calcified mass along the right adnexa is no longer present and was presumably a leiomyoma which is been removed. 4. Benign-appearing but enlarging right kidney lower pole cyst, 2.3 cm diameter currently. 5. Fluid density lesion below the right inferior pulmonary vein, probably a pericardial cyst. Electronically Signed   By: WVan ClinesM.D.   On: 03/08/2016 16:39   Ct Abdomen Pelvis W Contrast  Result Date: 03/08/2016 CLINICAL DATA:  Stage IIIC cancer of the ascending colon, status post partial colectomy, imaging restaging prior to initiation of chemotherapy and radiation therapy. EXAM: CT CHEST, ABDOMEN, AND PELVIS WITH CONTRAST TECHNIQUE: Multidetector CT imaging of the chest, abdomen and pelvis was  performed following the standard protocol during bolus administration of intravenous contrast. CONTRAST:  1056mISOVUE-300 IOPAMIDOL (ISOVUE-300) INJECTION 61% COMPARISON:  10/20/2015 FINDINGS: CT CHEST FINDINGS Cardiovascular: Unremarkable Mediastinum/Nodes: 2.6 by 1.7 cm fluid density lesion just below the right inferior pulmonary vein on image 32/2, compatible with pericardial cyst. Small calcified left hilar lymph nodes. Lungs/Pleura: Biapical pleuroparenchymal scarring, right greater than left. 3 mm left upper lobe nodule, image 52/6. Linear cluster of nodules in the left upper lobe on images 52-53 of series 6 with some calcification compatible with old granulomatous disease. Musculoskeletal: Thoracic spondylosis. CT ABDOMEN PELVIS FINDINGS Hepatobiliary: Unremarkable Pancreas: Unremarkable Spleen: Old granulomatous disease.  Otherwise unremarkable. Adrenals/Urinary Tract: Non rotated right kidney. Benign-appearing right kidney lower pole cyst, 2.3 by 2.3 cm on image 71/2, previously 1.7 by 1.5 cm. Stomach/Bowel: Right hemicolectomy. Mild prominence of stool in the distal colon. Vascular/Lymphatic: Previous large mesenteric lymph nodes are no longer present. No pathologic adenopathy. Minimal common iliac artery atherosclerotic calcification bilaterally. There is some scarring and trace fluid  density along the inferior margin the right paracolic gutter but no residual mass density in this region. Reproductive: The prior calcified mass along the right adnexa is no longer seen, presumably this is a leiomyoma which was removed. Other: Laparotomy site generally unremarkable. Musculoskeletal: Lower lumbar degenerative facet arthropathy. Grade 1 anterolisthesis at L3-4 and L5-S1, degenerative in nature. IMPRESSION: 1. Right hemicolectomy with removal of the cecal mass, pericecal soft tissue density, and enlarged mesenteric lymph nodes. No recurrent malignancy is currently visible. 2. Calcified left hilar lymph node  with some clustered small nodules in the left upper lobe, probably from old granulomatous disease, although may merit surveillance. 3. The prior calcified mass along the right adnexa is no longer present and was presumably a leiomyoma which is been removed. 4. Benign-appearing but enlarging right kidney lower pole cyst, 2.3 cm diameter currently. 5. Fluid density lesion below the right inferior pulmonary vein, probably a pericardial cyst. Electronically Signed   By: Van Clines M.D.   On: 03/08/2016 16:39    ASSESSMENT & PLAN:  66 year old female, with past medical history of hypertension and abdominal aorta aneurysm, presented with perforation right cecum colon cancer.  1. Cancer of ascending colon, pT4bN1bM0, stage IIIC, MSI-stable, (+) surgical margins at pelvic wall  -I previously discussed her surgical pathology report and prior CT scan findings in great details with patient and her daughter.  -I reviewed her staging CT chest, which was negative for metastasis. --Due to the locally advanced disease, perforated tumor, and positive surgical margins, she is at extremely high risk for cancer recurrence. -Her case was discussed in our tumor board, we recommend adjuvant chemotherapy and irradiation -She has started concurrent chemoradiation 1 week ago, has developed moderate fatigue, mild dizziness. -Lab results from the patient, she is neutropenic with ANC 0.9. She did have mild neutropenia with ANC 1.4 before she started treatment. Mild anemia is stable. -Due to her fatigue and neutropenia, I'll decrease her Xeloda by 563m/day (change to 1500 mg in the morning and 1003min the evening)  2. HTN -She'll follow-up with her primary care physician and continue medication -we discussed that chemotherapy may affect her blood pressure, we'll monitor her pressures closely -Her blood pressure is lower than her baseline, she also has some mild dizziness. I told her to hold Maxide, and monitor her  blood pressure at home  3. Anemia -Her anemia is related to her surgery and iron deficiency. Iron studies showed evidence of iron deficiency. -She previously took iron pill before, and had some constipation. She is agreeable to restart ferrous sulfate 1-2 tablets a day. Constipation management reviewed with her. I encouraged her to take the iron pill with orange juice oh vitamin C to improve the absorption, but she has not started yet -IV feraheme in a few weeks   Plan -Lab reviewed, we'll decrease Xeloda to 150018mm, 1000m30m  -Continue concurrent chemoradiation. Lab weekly. -She will see a BP next week, see me back in 2 weeks -IV feraheme in 1-2 weeks   All questions were answered. The patient knows to call the clinic with any problems, questions or concerns. I spent 20 minutes counseling the patient face to face. The total time spent in the appointment was 25 minutes and more than 50% was on counseling.     FengTruitt Merle 03/26/2016

## 2016-03-27 ENCOUNTER — Ambulatory Visit
Admission: RE | Admit: 2016-03-27 | Discharge: 2016-03-27 | Disposition: A | Discharge status: Home / Self Care | Payer: Federal, State, Local not specified - PPO | Source: Ambulatory Visit | Attending: Radiation Oncology | Admitting: Radiation Oncology

## 2016-03-27 ENCOUNTER — Ambulatory Visit: Payer: Federal, State, Local not specified - PPO

## 2016-03-27 DIAGNOSIS — Z510 Encounter for antineoplastic radiation therapy: Secondary | ICD-10-CM | POA: Diagnosis not present

## 2016-03-28 ENCOUNTER — Ambulatory Visit
Admission: RE | Admit: 2016-03-28 | Discharge: 2016-03-28 | Disposition: A | Discharge status: Home / Self Care | Payer: Federal, State, Local not specified - PPO | Source: Ambulatory Visit | Attending: Radiation Oncology | Admitting: Radiation Oncology

## 2016-03-28 ENCOUNTER — Ambulatory Visit: Payer: Federal, State, Local not specified - PPO

## 2016-03-28 DIAGNOSIS — Z510 Encounter for antineoplastic radiation therapy: Secondary | ICD-10-CM | POA: Diagnosis not present

## 2016-03-29 ENCOUNTER — Encounter: Payer: Self-pay | Admitting: Radiation Oncology

## 2016-03-29 ENCOUNTER — Ambulatory Visit
Admission: RE | Admit: 2016-03-29 | Discharge: 2016-03-29 | Disposition: A | Discharge status: Home / Self Care | Payer: Federal, State, Local not specified - PPO | Source: Ambulatory Visit | Attending: Radiation Oncology | Admitting: Radiation Oncology

## 2016-03-29 ENCOUNTER — Ambulatory Visit: Payer: Federal, State, Local not specified - PPO

## 2016-03-29 VITALS — BP 111/65 | HR 108 | Temp 98.6°F | Resp 18 | Wt 121.7 lb

## 2016-03-29 DIAGNOSIS — Z510 Encounter for antineoplastic radiation therapy: Secondary | ICD-10-CM | POA: Diagnosis not present

## 2016-03-29 DIAGNOSIS — C182 Malignant neoplasm of ascending colon: Secondary | ICD-10-CM

## 2016-03-29 NOTE — Progress Notes (Signed)
weekly rad tx  Rectal, 9/28 completed, she does have a big hemorrhoid ,  I hecked skin , no breakdown, " That is bothersome," bowels move every 2-3 days, takes miralax, taking xeloda   Bid,  Asking hat she can take for her hemorrhoid, appetite good, energy level mild fatigue BP 111/65 (BP Location: Left Arm, Patient Position: Sitting, Cuff Size: Normal)   Pulse (!) 108   Temp 98.6 F (37 C) (Oral)   Resp 18   Wt 121 lb 11.2 oz (55.2 kg)   BMI 20.25 kg/m   Wt Readings from Last 3 Encounters:  03/29/16 121 lb 11.2 oz (55.2 kg)  03/26/16 120 lb (54.4 kg)  03/18/16 123 lb 6.4 oz (56 kg)

## 2016-03-31 ENCOUNTER — Telehealth: Payer: Self-pay | Admitting: Hematology

## 2016-03-31 NOTE — Telephone Encounter (Signed)
Lvm advising appt added for APP on 10/3 @ 3.15pm.

## 2016-03-31 NOTE — Progress Notes (Signed)
Department of Radiation Oncology  Phone:  [phone] Fax:        [phone]  Weekly Treatment Note    Name: Brittney Spencer Date: 03/31/2016 MRN: [id number] DOB: April 25, 1951   Diagnosis:     ICD-9-CM ICD-10-CM   1. Cancer of ascending colon (HCC) 153.6 C18.2      Current dose: 16.2 Gy  Current fraction: 9   MEDICATIONS: Current Outpatient Prescriptions  Medication Sig Dispense Refill  . ALPRAZolam (XANAX) 0.25 MG tablet Take 0.25 mg by mouth daily as needed for anxiety.   3  . capecitabine (XELODA) 500 MG tablet Take 3 tablets (1,500 mg total) by mouth 2 (two) times daily after a meal. Take twice daily on the day of radiation only 180 tablet 0  . Cholecalciferol (VITAMIN D-3) 1000 units CAPS Take 1,000 Units by mouth daily.    . NORVASC 2.5 MG tablet Take 2.5 mg by mouth daily.    . polyethylene glycol (MIRALAX / GLYCOLAX) packet 1 dose daily, you can buy this as any pharmacy without a prescription. (Patient taking differently: Take 17 g by mouth daily as needed for mild constipation. ) 14 each 0  . triamterene-hydrochlorothiazide (MAXZIDE-25) 37.5-25 MG tablet Take 0.5 tablets by mouth daily.  3  . acetaminophen (TYLENOL) 325 MG tablet Take 2 tablets (650 mg total) by mouth every 6 (six) hours as needed for mild pain, moderate pain, fever or headache. (Patient not taking: Reported on 03/29/2016)    . HYDROcodone-acetaminophen (NORCO/VICODIN) 5-325 MG tablet Take 1-2 tablets by mouth every 4 (four) hours as needed for moderate pain or severe pain. (Patient not taking: Reported on 03/29/2016) 30 tablet 0  . ondansetron (ZOFRAN) 4 MG tablet Take 1 tablet (4 mg total) by mouth every 6 (six) hours as needed for nausea. (Patient not taking: Reported on 03/29/2016) 10 tablet 0  . oxyCODONE-acetaminophen (PERCOCET/ROXICET) 5-325 MG tablet Take 1-2 tablets by mouth every 4 (four) hours as needed for moderate pain. (Patient not taking: Reported on 03/29/2016) 40 tablet 0  . Probiotic  Product ([employer] PROBIOTIC PO) Take 1 capsule by mouth daily.      No current facility-administered medications for this encounter.      ALLERGIES: Fish allergy; Peanut-containing drug products; Soy allergy; and Buspirone   LABORATORY DATA:  Lab Results  Component Value Date   WBC 1.9 (L) 03/26/2016   HGB 9.5 (L) 03/26/2016   HCT 30.1 (L) 03/26/2016   MCV 76.1 (L) 03/26/2016   PLT 233 03/26/2016   Lab Results  Component Value Date   NA 137 03/26/2016   K 3.5 03/26/2016   CL 106 02/09/2016   CO2 26 03/26/2016   Lab Results  Component Value Date   ALT 10 03/26/2016   AST 16 03/26/2016   ALKPHOS 59 03/26/2016   BILITOT 0.58 03/26/2016     NARRATIVE: Brittney Spencer was seen today for weekly treatment management. The chart was checked and the patient's films were reviewed.  weekly rad tx  Rectal, 9/28 completed, she does have a big hemorrhoid ,  I hecked skin , no breakdown, " That is bothersome," bowels move every 2-3 days, takes miralax, taking xeloda   Bid,  Asking hat she can take for her hemorrhoid, appetite good, energy level mild fatigue BP 111/65 (BP Location: Left Arm, Patient Position: Sitting, Cuff Size: Normal)   Pulse (!) 108   Temp 98.6 F (37 C) (Oral)   Resp 18   Wt 121 lb 11.2 oz (55.2 kg)  BMI 20.25 kg/m   Wt Readings from Last 3 Encounters:  03/29/16 121 lb 11.2 oz (55.2 kg)  03/26/16 120 lb (54.4 kg)  03/18/16 123 lb 6.4 oz (56 kg)    PHYSICAL EXAMINATION: weight is 121 lb 11.2 oz (55.2 kg). Her oral temperature is 98.6 F (37 C). Her blood pressure is 111/65 and her pulse is 108 (abnormal). Her respiration is 18.        ASSESSMENT: The patient is doing satisfactorily with treatment.  PLAN: We will continue with the patient's radiation treatment as planned.

## 2016-04-01 ENCOUNTER — Ambulatory Visit
Admission: RE | Admit: 2016-04-01 | Discharge: 2016-04-01 | Disposition: A | Discharge status: Home / Self Care | Payer: Federal, State, Local not specified - PPO | Source: Ambulatory Visit | Attending: Radiation Oncology | Admitting: Radiation Oncology

## 2016-04-01 ENCOUNTER — Ambulatory Visit: Payer: Federal, State, Local not specified - PPO

## 2016-04-01 DIAGNOSIS — Z510 Encounter for antineoplastic radiation therapy: Secondary | ICD-10-CM | POA: Diagnosis not present

## 2016-04-02 ENCOUNTER — Ambulatory Visit: Payer: Federal, State, Local not specified - PPO

## 2016-04-02 ENCOUNTER — Ambulatory Visit
Admission: RE | Admit: 2016-04-02 | Discharge: 2016-04-02 | Disposition: A | Discharge status: Home / Self Care | Payer: Federal, State, Local not specified - PPO | Source: Ambulatory Visit | Attending: Radiation Oncology | Admitting: Radiation Oncology

## 2016-04-02 ENCOUNTER — Ambulatory Visit (HOSPITAL_BASED_OUTPATIENT_CLINIC_OR_DEPARTMENT_OTHER): Payer: Federal, State, Local not specified - PPO | Admitting: Nurse Practitioner

## 2016-04-02 ENCOUNTER — Other Ambulatory Visit (HOSPITAL_BASED_OUTPATIENT_CLINIC_OR_DEPARTMENT_OTHER): Payer: Federal, State, Local not specified - PPO

## 2016-04-02 ENCOUNTER — Telehealth: Payer: Self-pay | Admitting: Hematology

## 2016-04-02 VITALS — BP 137/81 | HR 88 | Temp 98.2°F | Resp 16 | Ht 65.0 in | Wt 120.5 lb

## 2016-04-02 DIAGNOSIS — C182 Malignant neoplasm of ascending colon: Secondary | ICD-10-CM | POA: Diagnosis not present

## 2016-04-02 DIAGNOSIS — D649 Anemia, unspecified: Secondary | ICD-10-CM

## 2016-04-02 DIAGNOSIS — I10 Essential (primary) hypertension: Secondary | ICD-10-CM | POA: Diagnosis not present

## 2016-04-02 DIAGNOSIS — D701 Agranulocytosis secondary to cancer chemotherapy: Secondary | ICD-10-CM

## 2016-04-02 DIAGNOSIS — Z510 Encounter for antineoplastic radiation therapy: Secondary | ICD-10-CM | POA: Diagnosis not present

## 2016-04-02 LAB — CBC & DIFF AND RETIC
BASO%: 0 % (ref 0.0–2.0)
Basophils Absolute: 0 10*3/uL (ref 0.0–0.1)
EOS%: 5.3 % (ref 0.0–7.0)
Eosinophils Absolute: 0.1 10*3/uL (ref 0.0–0.5)
HCT: 27.1 % — ABNORMAL LOW (ref 34.8–46.6)
HGB: 8.6 g/dL — ABNORMAL LOW (ref 11.6–15.9)
Immature Retic Fract: 11 % — ABNORMAL HIGH (ref 1.60–10.00)
LYMPH%: 44.1 % (ref 14.0–49.7)
MCH: 24.4 pg — AB (ref 25.1–34.0)
MCHC: 31.7 g/dL (ref 31.5–36.0)
MCV: 77 fL — AB (ref 79.5–101.0)
MONO#: 0.3 10*3/uL (ref 0.1–0.9)
MONO%: 16.4 % — AB (ref 0.0–14.0)
NEUT%: 34.2 % — ABNORMAL LOW (ref 38.4–76.8)
NEUTROS ABS: 0.5 10*3/uL — AB (ref 1.5–6.5)
Platelets: 158 10*3/uL (ref 145–400)
RBC: 3.52 10*6/uL — ABNORMAL LOW (ref 3.70–5.45)
RDW: 18.6 % — AB (ref 11.2–14.5)
RETIC %: 0.76 % (ref 0.70–2.10)
Retic Ct Abs: 26.75 10*3/uL — ABNORMAL LOW (ref 33.70–90.70)
WBC: 1.5 10*3/uL — AB (ref 3.9–10.3)
lymph#: 0.7 10*3/uL — ABNORMAL LOW (ref 0.9–3.3)

## 2016-04-02 NOTE — Telephone Encounter (Signed)
No 10/3 los/orders/referrals

## 2016-04-02 NOTE — Progress Notes (Signed)
[place] OFFICE PROGRESS NOTE   Diagnosis:  Stage IIIc right colon cancer Oncology History   Cancer of ascending colon [facility])   Staging form: Colon and Rectum, AJCC 7th Edition   - Clinical stage from 02/07/2016: Stage IIIC (T4b, N1b, M0) - Signed by [name], MD on 03/04/2016     Cancer of ascending colon (Brittney Spencer)   10/25/2015 Imaging    CT ABD/PELVIS:  Inflammatory changes inferior to the cecal tip appear improved, there is still irregular soft tissue thickening of the cecal tip, and there are adjacent prominent lymph nodes in the ileocolonic mesentery, measuring 13 mm on image 49 and 8 mm on image 52. In addition, there is a 2.5 x 1.8 cm nodule on image 46 which has central low density. Therefore, these findings are moderately suspicious for an underlying cecal malignancy with perforation.      01/19/2016 Procedure    COLONOSCOPY per Dr. [name]: Fungating, ulcerated mass almost obstructing mid ascending colon     02/05/2016 Tumor Marker    Patient's tumor was tested for the following markers: CEA Results of the tumor marker test revealed 5.7.     02/07/2016 Initial Diagnosis    Cancer of ascending colon (Brittney Spencer)     02/07/2016 Definitive Surgery    Laparoscopic assisted right hemicolectomy and right salpingo oopherectomy--Dr. [name]     02/07/2016 Pathologic Stage    p T4 N1b   2/43 nodes +     02/07/2016 Pathology Results    MMR normal; G2 adenocarcinoma;proximal & distal margins negative; soft tissue mass on pelvic sidewall + for adenocarcinoma with positive margin MSI Stable     03/08/2016 Imaging    CT chest negative for metastasis.      CURRENT THERAPY: Concurrent chemoradiation with Xeloda 1500 mg twice daily, starting on 03/19/2016. Xeloda dose reduced to 1500 mg each morning and 1000 mg each evening beginning 03/26/2016 due to neutropenia and fatigue.  INTERVAL HISTORY:   Brittney Spencer returns as scheduled. She continues  radiation and Xeloda. The Xeloda was dose reduced last week due to fatigue and neutropenia.  She notes improvement in fatigue. She has occasional nausea. No vomiting. No mouth sores. No diarrhea. She has had some constipation and issues with "hemorrhoids". She is taking Miralax. No hand or foot pain or redness. No fever. No shortness of breath or cough. No urinary symptoms.  Objective:  Vital signs in last 24 hours:  Blood pressure 137/81, pulse 88, temperature 98.2 F (36.8 C), temperature source Oral, resp. rate 16, height _0  (1.651 m), weight 120 lb 8 oz (54.7 kg), SpO2 99 %.    HEENT: No thrush or ulcers. Resp: Lungs clear bilaterally. Cardio: Regular rate and rhythm. GI: Abdomen soft and nontender. No hepatomegaly. Vascular: No leg edema. Neuro: Alert and oriented. Gait normal.  Skin: Palms without erythema.    Lab Results:  Lab Results  Component Value Date   WBC 1.5 (L) 04/02/2016   HGB 8.6 (L) 04/02/2016   HCT 27.1 (L) 04/02/2016   MCV 77.0 (L) 04/02/2016   PLT 158 04/02/2016   NEUTROABS 0.5 (LL) 04/02/2016    Imaging:  No results found.  Medications: I have reviewed the patient's current medications.  Assessment/Plan: 1. Cancer of ascending colon, pT4bN1bM0, stage IIIC, MSI-stable, (+) surgical margins at pelvic wall; radiation/Xeloda initiated 03/19/2016. Xeloda dose reduced to 1500 mg in the morning and 1000 mg in the evening beginning 03/26/2016 due to fatigue and neutropenia. 2. Hypertension 3. Anemia  Disposition: Brittney Spencer appears stable. She has progressive neutropenia on labs today. I discussed the lab results with Dr. Burr Medico. The Xeloda will be further dose reduced to 1000 mg every morning and 1000 mg every evening. I reviewed neutropenic precautions with Brittney Spencer. She understands to contact the office with fever, chills, other signs of infection.  She will return for a follow-up visit and labs in one week. She will contact the office in the  interim as outlined above or with any other problems.   Ned Card ANP/GNP-BC   04/02/2016  3:52 PM

## 2016-04-03 ENCOUNTER — Ambulatory Visit: Payer: Federal, State, Local not specified - PPO

## 2016-04-03 ENCOUNTER — Ambulatory Visit
Admission: RE | Admit: 2016-04-03 | Discharge: 2016-04-03 | Disposition: A | Discharge status: Home / Self Care | Payer: Federal, State, Local not specified - PPO | Source: Ambulatory Visit | Attending: Radiation Oncology | Admitting: Radiation Oncology

## 2016-04-03 DIAGNOSIS — Z510 Encounter for antineoplastic radiation therapy: Secondary | ICD-10-CM | POA: Diagnosis not present

## 2016-04-04 ENCOUNTER — Ambulatory Visit
Admission: RE | Admit: 2016-04-04 | Discharge: 2016-04-04 | Disposition: A | Discharge status: Home / Self Care | Payer: Federal, State, Local not specified - PPO | Source: Ambulatory Visit | Attending: Radiation Oncology | Admitting: Radiation Oncology

## 2016-04-04 ENCOUNTER — Ambulatory Visit: Payer: Federal, State, Local not specified - PPO

## 2016-04-04 DIAGNOSIS — Z510 Encounter for antineoplastic radiation therapy: Secondary | ICD-10-CM | POA: Diagnosis not present

## 2016-04-05 ENCOUNTER — Ambulatory Visit: Payer: Federal, State, Local not specified - PPO

## 2016-04-05 ENCOUNTER — Ambulatory Visit
Admission: RE | Admit: 2016-04-05 | Discharge: 2016-04-05 | Disposition: A | Discharge status: Home / Self Care | Payer: Federal, State, Local not specified - PPO | Source: Ambulatory Visit | Attending: Radiation Oncology | Admitting: Radiation Oncology

## 2016-04-05 ENCOUNTER — Encounter: Payer: Self-pay | Admitting: Radiation Oncology

## 2016-04-05 VITALS — BP 114/64 | HR 94 | Temp 99.1°F | Ht 65.0 in | Wt 119.6 lb

## 2016-04-05 DIAGNOSIS — C182 Malignant neoplasm of ascending colon: Secondary | ICD-10-CM

## 2016-04-05 DIAGNOSIS — Z510 Encounter for antineoplastic radiation therapy: Secondary | ICD-10-CM | POA: Diagnosis not present

## 2016-04-05 NOTE — Progress Notes (Signed)
Department of Radiation Oncology  Phone:  [phone] Fax:        [phone]  Weekly Treatment Note    Name: Brittney Spencer Date: 04/05/2016 MRN: [id number] DOB: 14-Aug-1950   Diagnosis:     ICD-9-CM ICD-10-CM   1. Cancer of ascending colon (HCC) 153.6 C18.2      Current dose: 25.2 Gy  Current fraction: 14   MEDICATIONS: Current Outpatient Prescriptions  Medication Sig Dispense Refill  . acetaminophen (TYLENOL) 325 MG tablet Take 2 tablets (650 mg total) by mouth every 6 (six) hours as needed for mild pain, moderate pain, fever or headache.    . ALPRAZolam (XANAX) 0.25 MG tablet Take 0.25 mg by mouth daily as needed for anxiety.   3  . capecitabine (XELODA) 500 MG tablet Take 3 tablets (1,500 mg total) by mouth 2 (two) times daily after a meal. Take twice daily on the day of radiation only 180 tablet 0  . NORVASC 2.5 MG tablet Take 2.5 mg by mouth daily.    . polyethylene glycol (MIRALAX / GLYCOLAX) packet 1 dose daily, you can buy this as any pharmacy without a prescription. 14 each 0  . Probiotic Product ([employer] PROBIOTIC PO) Take 1 capsule by mouth daily.     . Cholecalciferol (VITAMIN D-3) 1000 units CAPS Take 1,000 Units by mouth daily.    Marland Kitchen HYDROcodone-acetaminophen (NORCO/VICODIN) 5-325 MG tablet Take 1-2 tablets by mouth every 4 (four) hours as needed for moderate pain or severe pain. (Patient not taking: Reported on 04/05/2016) 30 tablet 0  . ondansetron (ZOFRAN) 4 MG tablet Take 1 tablet (4 mg total) by mouth every 6 (six) hours as needed for nausea. (Patient not taking: Reported on 04/05/2016) 10 tablet 0  . oxyCODONE-acetaminophen (PERCOCET/ROXICET) 5-325 MG tablet Take 1-2 tablets by mouth every 4 (four) hours as needed for moderate pain. (Patient not taking: Reported on 04/05/2016) 40 tablet 0   No current facility-administered medications for this encounter.      ALLERGIES: Fish allergy; Peanut-containing drug products; Soy allergy; and  Buspirone   LABORATORY DATA:  Lab Results  Component Value Date   WBC 1.5 (L) 04/02/2016   HGB 8.6 (L) 04/02/2016   HCT 27.1 (L) 04/02/2016   MCV 77.0 (L) 04/02/2016   PLT 158 04/02/2016   Lab Results  Component Value Date   NA 137 03/26/2016   K 3.5 03/26/2016   CL 106 02/09/2016   CO2 26 03/26/2016   Lab Results  Component Value Date   ALT 10 03/26/2016   AST 16 03/26/2016   ALKPHOS 59 03/26/2016   BILITOT 0.58 03/26/2016     NARRATIVE: Brittney Spencer was seen today for weekly treatment management. The chart was checked and the patient's films were reviewed.  [name] has rceived 13 fractions to her pelvis. She denies any pain in the tx field. Voiding completely.  Note soft, stool but denies any diarrhea.  Admits to decrease in fatigue at this time.  Temp 99.1  BP 114/64   Pulse 94   Temp 99.1 F (37.3 C) (Oral)   Ht 5\' 5"  (1.651 m)   Wt 119 lb 9.6 oz (54.3 kg)   BMI 19.90 kg/m    BP 114/64   Pulse 94   Temp 99.1 F (37.3 C) (Oral)   Ht 5\' 5"  (1.651 m)   Wt 119 lb 9.6 oz (54.3 kg)   BMI 19.90 kg/m    Wt Readings from Last 3 Encounters:  04/05/16 119 lb 9.6  oz (54.3 kg)  04/02/16 120 lb 8 oz (54.7 kg)  03/29/16 121 lb 11.2 oz (55.2 kg)       PHYSICAL EXAMINATION: height is 5\' 5"  (1.651 m) and weight is 119 lb 9.6 oz (54.3 kg). Her oral temperature is 99.1 F (37.3 C). Her blood pressure is 114/64 and her pulse is 94.        ASSESSMENT: The patient is doing satisfactorily with treatment.  PLAN: We will continue with the patient's radiation treatment as planned.

## 2016-04-05 NOTE — Progress Notes (Signed)
[name] has rceived 13 fractions to her pelvis. She denies any pain in the tx field. Voiding completely.  Note soft, stool but denies any diarrhea.  Admits to decrease in fatigue at this time.  Temp 99.1  BP 95/73   Pulse 86   Temp 99.1 F (37.3 C) (Oral)   Ht 5\' 5"  (1.651 m)   Wt 119 lb 9.6 oz (54.3 kg)   BMI 19.90 kg/m    BP 114/64   Pulse 94   Temp 99.1 F (37.3 C) (Oral)   Ht 5\' 5"  (1.651 m)   Wt 119 lb 9.6 oz (54.3 kg)   BMI 19.90 kg/m    Wt Readings from Last 3 Encounters:  04/05/16 119 lb 9.6 oz (54.3 kg)  04/02/16 120 lb 8 oz (54.7 kg)  03/29/16 121 lb 11.2 oz (55.2 kg)

## 2016-04-08 ENCOUNTER — Telehealth: Payer: Self-pay | Admitting: *Deleted

## 2016-04-08 ENCOUNTER — Ambulatory Visit
Admission: RE | Admit: 2016-04-08 | Discharge: 2016-04-08 | Disposition: A | Discharge status: Home / Self Care | Payer: Federal, State, Local not specified - PPO | Source: Ambulatory Visit | Attending: Radiation Oncology | Admitting: Radiation Oncology

## 2016-04-08 ENCOUNTER — Ambulatory Visit: Payer: Federal, State, Local not specified - PPO

## 2016-04-08 DIAGNOSIS — Z510 Encounter for antineoplastic radiation therapy: Secondary | ICD-10-CM | POA: Diagnosis not present

## 2016-04-08 NOTE — Telephone Encounter (Addendum)
"  I'll be leaving at 2:00 to come in for my scheduled RT today.  I felt good this weekend being off Xeloda but today I started using it again and feel like I'm back to where I was.  I feel tired, more tense, not able to relax or get rested.  Lots of anxiety.  I do not feel unsteady or imbalanced.  I am able to function.  No fever, I'm eating.  I'm concerned and need to know what's the critical point for immediate action.  I can be reached at [phone]." Next scheduled F/U tomorrow at 2:00 pm.

## 2016-04-09 ENCOUNTER — Other Ambulatory Visit (HOSPITAL_BASED_OUTPATIENT_CLINIC_OR_DEPARTMENT_OTHER): Payer: Federal, State, Local not specified - PPO

## 2016-04-09 ENCOUNTER — Ambulatory Visit: Payer: Federal, State, Local not specified - PPO | Admitting: Hematology

## 2016-04-09 ENCOUNTER — Ambulatory Visit
Admission: RE | Admit: 2016-04-09 | Discharge: 2016-04-09 | Disposition: A | Discharge status: Home / Self Care | Payer: Federal, State, Local not specified - PPO | Source: Ambulatory Visit | Attending: Radiation Oncology | Admitting: Radiation Oncology

## 2016-04-09 ENCOUNTER — Ambulatory Visit (HOSPITAL_BASED_OUTPATIENT_CLINIC_OR_DEPARTMENT_OTHER): Payer: Federal, State, Local not specified - PPO | Admitting: Nurse Practitioner

## 2016-04-09 ENCOUNTER — Ambulatory Visit: Payer: Federal, State, Local not specified - PPO

## 2016-04-09 VITALS — BP 122/63 | HR 83 | Temp 98.3°F | Resp 20 | Ht 65.0 in | Wt 120.9 lb

## 2016-04-09 DIAGNOSIS — C182 Malignant neoplasm of ascending colon: Secondary | ICD-10-CM | POA: Diagnosis not present

## 2016-04-09 DIAGNOSIS — D709 Neutropenia, unspecified: Secondary | ICD-10-CM | POA: Diagnosis not present

## 2016-04-09 DIAGNOSIS — I10 Essential (primary) hypertension: Secondary | ICD-10-CM

## 2016-04-09 DIAGNOSIS — D649 Anemia, unspecified: Secondary | ICD-10-CM

## 2016-04-09 DIAGNOSIS — F411 Generalized anxiety disorder: Secondary | ICD-10-CM

## 2016-04-09 DIAGNOSIS — Z510 Encounter for antineoplastic radiation therapy: Secondary | ICD-10-CM | POA: Diagnosis not present

## 2016-04-09 LAB — COMPREHENSIVE METABOLIC PANEL
ALK PHOS: 44 U/L (ref 40–150)
ALT: <9 U/L (ref 0–55)
AST: 13 U/L (ref 5–34)
Albumin: 3.4 g/dL — ABNORMAL LOW (ref 3.5–5.0)
Anion Gap: 7 mEq/L (ref 3–11)
BUN: 11.1 mg/dL (ref 7.0–26.0)
CALCIUM: 9.1 mg/dL (ref 8.4–10.4)
CO2: 25 mEq/L (ref 22–29)
Chloride: 107 mEq/L (ref 98–109)
Creatinine: 0.8 mg/dL (ref 0.6–1.1)
EGFR: 88 mL/min/{1.73_m2} — AB (ref >90)
GLUCOSE: 96 mg/dL (ref 70–140)
Potassium: 4.4 mEq/L (ref 3.5–5.1)
Sodium: 139 mEq/L (ref 136–145)
Total Protein: 6.5 g/dL (ref 6.4–8.3)

## 2016-04-09 LAB — CBC & DIFF AND RETIC
BASO%: 0.4 % (ref 0.0–2.0)
BASOS ABS: 0 10*3/uL (ref 0.0–0.1)
EOS%: 4.4 % (ref 0.0–7.0)
Eosinophils Absolute: 0.1 10*3/uL (ref 0.0–0.5)
HEMATOCRIT: 27.8 % — AB (ref 34.8–46.6)
HGB: 8.8 g/dL — ABNORMAL LOW (ref 11.6–15.9)
Immature Retic Fract: 8.4 % (ref 1.60–10.00)
LYMPH%: 27.9 % (ref 14.0–49.7)
MCH: 24.6 pg — AB (ref 25.1–34.0)
MCHC: 31.7 g/dL (ref 31.5–36.0)
MCV: 77.9 fL — ABNORMAL LOW (ref 79.5–101.0)
MONO#: 0.4 10*3/uL (ref 0.1–0.9)
MONO%: 15.7 % — AB (ref 0.0–14.0)
NEUT#: 1.2 10*3/uL — ABNORMAL LOW (ref 1.5–6.5)
NEUT%: 51.6 % (ref 38.4–76.8)
PLATELETS: 142 10*3/uL — AB (ref 145–400)
RBC: 3.57 10*6/uL — ABNORMAL LOW (ref 3.70–5.45)
RDW: 19.7 % — AB (ref 11.2–14.5)
RETIC %: 0.65 % — AB (ref 0.70–2.10)
Retic Ct Abs: 23.21 10*3/uL — ABNORMAL LOW (ref 33.70–90.70)
WBC: 2.3 10*3/uL — ABNORMAL LOW (ref 3.9–10.3)
lymph#: 0.6 10*3/uL — ABNORMAL LOW (ref 0.9–3.3)

## 2016-04-09 NOTE — Progress Notes (Signed)
[place] OFFICE PROGRESS NOTE   Diagnosis:  Stage IIIc right colon cancer     Oncology History   Cancer of ascending colon [facility]) Staging form: Colon and Rectum, AJCC 7th Edition - Clinical stage from 02/07/2016: Stage IIIC (T4b, N1b, M0) - Signed by [name], MD on 03/04/2016     Cancer of ascending colon ([place])   10/25/2015 Imaging    CT ABD/PELVIS: Inflammatory changes inferior to the cecal tip appear improved, there is still irregular soft tissue thickening of the cecal tip, and there are adjacent prominent lymph nodes in the ileocolonic mesentery, measuring 13 mm on image 49 and 8 mm on image 52. In addition, there is a 2.5 x 1.8 cm nodule on image 46 which has central low density. Therefore, these findings are moderately suspicious for an underlying cecal malignancy with perforation.      01/19/2016 Procedure    COLONOSCOPY per Dr. [name]: Fungating, ulcerated mass almost obstructing mid ascending colon     02/05/2016 Tumor Marker    Patient's tumor was tested for the following markers: CEA Results of the tumor marker test revealed 5.7.     02/07/2016 Initial Diagnosis    Cancer of ascending colon ([place])     02/07/2016 Definitive Surgery    Laparoscopic assisted right hemicolectomy and right salpingo oopherectomy--Dr. [name]     02/07/2016 Pathologic Stage    p T4 N1b  2/43 nodes +     02/07/2016 Pathology Results    MMR normal; G2 adenocarcinoma;proximal & distal margins negative; soft tissue mass on pelvic sidewall + for adenocarcinoma with positive margin MSI Stable     03/08/2016 Imaging    CT chest negative for metastasis.      INTERVAL HISTORY:   [name] returns as scheduled. She continues radiation and Xeloda. The Xeloda dose was reduced following her visit last week due to neutropenia. She denies nausea/vomiting. No mouth sores. No diarrhea. No hand or foot pain or redness. She felt well over the past weekend.  She resumed Xeloda Monday morning. After resuming Xeloda she did not "feel well". She could not relax. She felt "tense". She did not take the dose Monday evening. She tried a Xanax tablet which helped. She reports a history of anxiety/panic attacks. She thinks she is having anxiety issues related to the treatment she is currently undergoing. Current Xanax dose is 0.25 mg. She reports she breaks the tablets in half.  Objective:  Vital signs in last 24 hours:  Blood pressure 122/63, pulse 83, temperature 98.3 F (36.8 C), temperature source Oral, resp. rate 20, height '5\' 5"'  (1.651 m), weight 120 lb 14.4 oz (54.8 kg), SpO2 100 %.    HEENT: No thrush or ulcers. Resp: Lungs clear bilaterally. Cardio: Regular rate and rhythm. GI: Abdomen soft and nontender. No hepatomegaly. Vascular: No leg edema. Skin: Palms without erythema, mild hyperpigmentation.    Lab Results:  Lab Results  Component Value Date   WBC 2.3 (L) 04/09/2016   HGB 8.8 (L) 04/09/2016   HCT 27.8 (L) 04/09/2016   MCV 77.9 (L) 04/09/2016   PLT 142 (L) 04/09/2016   NEUTROABS 1.2 (L) 04/09/2016    Imaging:  No results found.  Medications: I have reviewed the patient's current medications.  Assessment/Plan: 1. Cancer of ascending colon, pT4bN1bM0, stage IIIC, MSI-stable, (+) surgical margins at pelvic wall; radiation/Xeloda initiated 03/19/2016. Xeloda dose reduced to 1500 mg in the morning and 1000 mg in the evening beginning 03/26/2016 due to fatigue and  neutropenia. Xeloda dose reduced to 1000 mg twice daily beginning 04/02/2016. 2. Hypertension 3. Anemia 4. Neutropenia. Question mild baseline neutropenia, exacerbated by Xeloda. 5. Anxiety. She will continue Xanax as needed.    Disposition: Brittney Spencer appears stable. The neutrophil count is better. She will continue Xeloda at the current dose of 1000 mg twice daily.  She is having anxiety. She will continue low-dose Xanax daily as needed. She understands she  should not be driving while taking Xanax.  She will return for labs and a follow-up visit in one week. She will contact the office in the interim with any problems.  Brittney Spencer ANP/GNP-BC   04/09/2016  2:36 PM

## 2016-04-10 ENCOUNTER — Ambulatory Visit: Payer: Federal, State, Local not specified - PPO

## 2016-04-10 ENCOUNTER — Ambulatory Visit
Admission: RE | Admit: 2016-04-10 | Discharge: 2016-04-10 | Disposition: A | Discharge status: Home / Self Care | Payer: Federal, State, Local not specified - PPO | Source: Ambulatory Visit | Attending: Radiation Oncology | Admitting: Radiation Oncology

## 2016-04-10 DIAGNOSIS — Z510 Encounter for antineoplastic radiation therapy: Secondary | ICD-10-CM | POA: Diagnosis not present

## 2016-04-11 ENCOUNTER — Ambulatory Visit
Admission: RE | Admit: 2016-04-11 | Discharge: 2016-04-11 | Disposition: A | Discharge status: Home / Self Care | Payer: Federal, State, Local not specified - PPO | Source: Ambulatory Visit | Attending: Radiation Oncology | Admitting: Radiation Oncology

## 2016-04-11 ENCOUNTER — Ambulatory Visit: Payer: Federal, State, Local not specified - PPO

## 2016-04-11 ENCOUNTER — Encounter: Payer: Self-pay | Admitting: Radiation Oncology

## 2016-04-11 VITALS — BP 120/69 | HR 79 | Temp 98.6°F | Resp 18 | Ht 65.0 in | Wt 120.6 lb

## 2016-04-11 DIAGNOSIS — C182 Malignant neoplasm of ascending colon: Secondary | ICD-10-CM

## 2016-04-11 DIAGNOSIS — Z510 Encounter for antineoplastic radiation therapy: Secondary | ICD-10-CM | POA: Diagnosis not present

## 2016-04-11 NOTE — Progress Notes (Signed)
Department of Radiation Oncology  Phone:  [phone] Fax:        [phone]  Weekly Treatment Note    Name: Brittney Spencer Date: 04/11/2016 MRN: [id number] DOB: Dec 28, 1950   Diagnosis:     ICD-9-CM ICD-10-CM   1. Cancer of ascending colon (HCC) 153.6 C18.2      Current dose: 32.4 Gy  Current fraction:  18   MEDICATIONS: Current Outpatient Prescriptions  Medication Sig Dispense Refill  . ALPRAZolam (XANAX) 0.25 MG tablet Take 0.25 mg by mouth daily as needed for anxiety.   3  . capecitabine (XELODA) 500 MG tablet Take 3 tablets (1,500 mg total) by mouth 2 (two) times daily after a meal. Take twice daily on the day of radiation only 180 tablet 0  . NORVASC 2.5 MG tablet Take 2.5 mg by mouth daily.    . polyethylene glycol (MIRALAX / GLYCOLAX) packet 1 dose daily, you can buy this as any pharmacy without a prescription. 14 each 0  . Probiotic Product ([employer] PROBIOTIC PO) Take 1 capsule by mouth daily.     Marland Kitchen acetaminophen (TYLENOL) 325 MG tablet Take 2 tablets (650 mg total) by mouth every 6 (six) hours as needed for mild pain, moderate pain, fever or headache. (Patient not taking: Reported on 04/11/2016)    . HYDROcodone-acetaminophen (NORCO/VICODIN) 5-325 MG tablet Take 1-2 tablets by mouth every 4 (four) hours as needed for moderate pain or severe pain. (Patient not taking: Reported on 04/11/2016) 30 tablet 0  . ondansetron (ZOFRAN) 4 MG tablet Take 1 tablet (4 mg total) by mouth every 6 (six) hours as needed for nausea. (Patient not taking: Reported on 04/11/2016) 10 tablet 0  . oxyCODONE-acetaminophen (PERCOCET/ROXICET) 5-325 MG tablet Take 1-2 tablets by mouth every 4 (four) hours as needed for moderate pain. (Patient not taking: Reported on 04/11/2016) 40 tablet 0   No current facility-administered medications for this encounter.      ALLERGIES: Fish allergy; Peanut-containing drug products; Soy allergy; and Buspirone   LABORATORY DATA:  Lab Results  Component  Value Date   WBC 2.3 (L) 04/09/2016   HGB 8.8 (L) 04/09/2016   HCT 27.8 (L) 04/09/2016   MCV 77.9 (L) 04/09/2016   PLT 142 (L) 04/09/2016   Lab Results  Component Value Date   NA 139 04/09/2016   K 4.4 04/09/2016   CL 106 02/09/2016   CO2 25 04/09/2016   Lab Results  Component Value Date   ALT <9 04/09/2016   AST 13 04/09/2016   ALKPHOS 44 04/09/2016   BILITOT <0.30 04/09/2016     NARRATIVE: Brittney Spencer was seen today for weekly treatment management. The chart was checked and the patient's films were reviewed.  [name] has rceived 18 fractions to her pelvis. She denies any pain in the tx field. Voiding completely.  Note soft, stool but denies any diarrhea.  Admits to decrease in fatigue at this time.   BP 120/69 (BP Location: Right Arm, Patient Position: Sitting, Cuff Size: Normal)   Pulse 79   Temp 98.6 F (37 C) (Oral)   Resp 18   Ht 5\' 5"  (1.651 m)   Wt 120 lb 9.6 oz (54.7 kg)   SpO2 100%   BMI 20.07 kg/m   PHYSICAL EXAMINATION: height is 5\' 5"  (1.651 m) and weight is 120 lb 9.6 oz (54.7 kg). Her oral temperature is 98.6 F (37 C). Her blood pressure is 120/69 and her pulse is 79. Her respiration is 18 and oxygen saturation  is 100%.        ASSESSMENT: The patient is doing satisfactorily with treatment.  PLAN: We will continue with the patient's radiation treatment as planned.

## 2016-04-11 NOTE — Progress Notes (Signed)
[name] has rceived 18 fractions to her pelvis. She denies any pain in the tx field. Voiding completely.  Note soft, stool but denies any diarrhea.  Admits to decrease in fatigue at this time.   BP 120/69 (BP Location: Right Arm, Patient Position: Sitting, Cuff Size: Normal)   Pulse 79   Temp 98.6 F (37 C) (Oral)   Resp 18   Ht 5\' 5"  (1.651 m)   Wt 120 lb 9.6 oz (54.7 kg)   SpO2 100%   BMI 20.07 kg/m

## 2016-04-12 ENCOUNTER — Ambulatory Visit
Admission: RE | Admit: 2016-04-12 | Discharge: 2016-04-12 | Disposition: A | Discharge status: Home / Self Care | Payer: Federal, State, Local not specified - PPO | Source: Ambulatory Visit | Attending: Radiation Oncology | Admitting: Radiation Oncology

## 2016-04-12 ENCOUNTER — Ambulatory Visit: Payer: Federal, State, Local not specified - PPO

## 2016-04-12 DIAGNOSIS — Z510 Encounter for antineoplastic radiation therapy: Secondary | ICD-10-CM | POA: Diagnosis not present

## 2016-04-14 ENCOUNTER — Telehealth: Payer: Self-pay | Admitting: Hematology

## 2016-04-14 NOTE — Telephone Encounter (Signed)
PER 10/10 LOS CXD 10/17 LAB AND SCHEDULED PATIENT FOR LAB/YF 10/16. SPOKE WITH PATIENT SHE IS AWARE.

## 2016-04-15 ENCOUNTER — Ambulatory Visit: Payer: Federal, State, Local not specified - PPO

## 2016-04-15 ENCOUNTER — Ambulatory Visit (HOSPITAL_BASED_OUTPATIENT_CLINIC_OR_DEPARTMENT_OTHER): Payer: Federal, State, Local not specified - PPO | Admitting: Hematology

## 2016-04-15 ENCOUNTER — Encounter: Payer: Self-pay | Admitting: Hematology

## 2016-04-15 ENCOUNTER — Other Ambulatory Visit (HOSPITAL_BASED_OUTPATIENT_CLINIC_OR_DEPARTMENT_OTHER): Payer: Federal, State, Local not specified - PPO

## 2016-04-15 ENCOUNTER — Ambulatory Visit
Admission: RE | Admit: 2016-04-15 | Discharge: 2016-04-15 | Disposition: A | Discharge status: Home / Self Care | Payer: Federal, State, Local not specified - PPO | Source: Ambulatory Visit | Attending: Radiation Oncology | Admitting: Radiation Oncology

## 2016-04-15 VITALS — BP 134/64 | HR 87 | Temp 98.0°F | Resp 18 | Ht 65.0 in | Wt 120.2 lb

## 2016-04-15 DIAGNOSIS — I10 Essential (primary) hypertension: Secondary | ICD-10-CM

## 2016-04-15 DIAGNOSIS — C182 Malignant neoplasm of ascending colon: Secondary | ICD-10-CM

## 2016-04-15 DIAGNOSIS — D500 Iron deficiency anemia secondary to blood loss (chronic): Secondary | ICD-10-CM

## 2016-04-15 DIAGNOSIS — D509 Iron deficiency anemia, unspecified: Secondary | ICD-10-CM

## 2016-04-15 DIAGNOSIS — Z510 Encounter for antineoplastic radiation therapy: Secondary | ICD-10-CM | POA: Diagnosis not present

## 2016-04-15 LAB — CBC & DIFF AND RETIC
BASO%: 0.6 % (ref 0.0–2.0)
BASOS ABS: 0 10*3/uL (ref 0.0–0.1)
EOS ABS: 0.3 10*3/uL (ref 0.0–0.5)
EOS%: 14.1 % — AB (ref 0.0–7.0)
HEMATOCRIT: 28.6 % — AB (ref 34.8–46.6)
HEMOGLOBIN: 9.1 g/dL — AB (ref 11.6–15.9)
IMMATURE RETIC FRACT: 10.8 % — AB (ref 1.60–10.00)
LYMPH#: 0.4 10*3/uL — AB (ref 0.9–3.3)
LYMPH%: 24.3 % (ref 14.0–49.7)
MCH: 24.5 pg — AB (ref 25.1–34.0)
MCHC: 31.8 g/dL (ref 31.5–36.0)
MCV: 77.1 fL — ABNORMAL LOW (ref 79.5–101.0)
MONO#: 0.3 10*3/uL (ref 0.1–0.9)
MONO%: 18.6 % — AB (ref 0.0–14.0)
NEUT#: 0.8 10*3/uL — ABNORMAL LOW (ref 1.5–6.5)
NEUT%: 42.4 % (ref 38.4–76.8)
NRBC: 0 % (ref 0–0)
Platelets: 150 10*3/uL (ref 145–400)
RBC: 3.71 10*6/uL (ref 3.70–5.45)
RDW: 20.6 % — AB (ref 11.2–14.5)
RETIC %: 0.55 % — AB (ref 0.70–2.10)
Retic Ct Abs: 20.41 10*3/uL — ABNORMAL LOW (ref 33.70–90.70)
WBC: 1.8 10*3/uL — ABNORMAL LOW (ref 3.9–10.3)

## 2016-04-15 LAB — COMPREHENSIVE METABOLIC PANEL
ALBUMIN: 3.2 g/dL — AB (ref 3.5–5.0)
ALK PHOS: 42 U/L (ref 40–150)
ALT: 7 U/L (ref 0–55)
AST: 14 U/L (ref 5–34)
Anion Gap: 8 mEq/L (ref 3–11)
BILIRUBIN TOTAL: 0.25 mg/dL (ref 0.20–1.20)
BUN: 9.4 mg/dL (ref 7.0–26.0)
CALCIUM: 8.9 mg/dL (ref 8.4–10.4)
CO2: 25 mEq/L (ref 22–29)
CREATININE: 0.8 mg/dL (ref 0.6–1.1)
Chloride: 107 mEq/L (ref 98–109)
EGFR: 90 mL/min/{1.73_m2} — ABNORMAL LOW (ref >90)
GLUCOSE: 95 mg/dL (ref 70–140)
Potassium: 4 mEq/L (ref 3.5–5.1)
SODIUM: 140 meq/L (ref 136–145)
TOTAL PROTEIN: 6.2 g/dL — AB (ref 6.4–8.3)

## 2016-04-15 LAB — FERRITIN: FERRITIN: 28 ng/mL (ref 9–269)

## 2016-04-15 NOTE — Progress Notes (Signed)
[facility]  [phone] [phone]  Clinic Follow Up Note   Patient Care Team: Pcp Not In System as PCP - General 04/15/2016  CHIEF COMPLAINTS:  Follow up stage IIIC right colon cancer   Oncology History   Cancer of ascending colon [facility])   Staging form: Colon and Rectum, AJCC 7th Edition   - Clinical stage from 02/07/2016: Stage IIIC (T4b, N1b, M0) - Signed by [name], MD on 03/04/2016      Cancer of ascending colon ([place])   10/25/2015 Imaging    CT ABD/PELVIS:  Inflammatory changes inferior to the cecal tip appear improved, there is still irregular soft tissue thickening of the cecal tip, and there are adjacent prominent lymph nodes in the ileocolonic mesentery, measuring 13 mm on image 49 and 8 mm on image 52. In addition, there is a 2.5 x 1.8 cm nodule on image 46 which has central low density. Therefore, these findings are moderately suspicious for an underlying cecal malignancy with perforation.       01/19/2016 Procedure    COLONOSCOPY per Dr. [name]: Fungating, ulcerated mass almost obstructing mid ascending colon      02/05/2016 Tumor Marker    Patient's tumor was tested for the following markers: CEA Results of the tumor marker test revealed 5.7.      02/07/2016 Initial Diagnosis    Cancer of ascending colon ([place])     02/07/2016 Definitive Surgery    Laparoscopic assisted right hemicolectomy and right salpingo oopherectomy--Dr. [name]      02/07/2016 Pathologic Stage    p T4 N1b   2/43 nodes +      02/07/2016 Pathology Results    MMR normal; G2 adenocarcinoma;proximal & distal margins negative; soft tissue mass on pelvic sidewall + for adenocarcinoma with positive margin MSI Stable      03/08/2016 Imaging    CT chest negative for metastasis.        HISTORY OF PRESENTING ILLNESS:  Brittney Spencer 65 y.o. female is here because of her newly diagnsosed ascending colon cancer. She is accompanied by her daughter to my clinic  today.   She initially presented with vaginal discharge and gastric discomfot., and was seen by her GYN and underwent CT scan which showedA perforated appendicitis. She was treated nonoperatively with IV antibiotics with resolution of her symptoms. Posttreatment CT scan questioned  Some thickening of the cecum, question neoplasm. Colonoscopy was recommended and was delayed somewhat due to patient issues and revealed an obvious malignancy in the cecum with partial obstruction. Biopsy revealed adenocarcinoma. She was referred to Dr. [name] and underwent laparoscopic-assisted and possible open right hemicolectomy on 02/07/2016. Per the OR note, at laparoscopy there was a bulky somewhat fixed tumor in the cecum. There was found to be a perforated cancer with a chronic cavity invading into the posterior lateral retroperitoneum.  The cancer was very bulky and locally invasive but was grossly completely removed  With ileotransverse colostomy. Final pathology revealed a 5 cm tumor  With microscopically broadly positive lateral margin in the retroperitoneum (grossly negative by inspection of the time of surgery) and 2 out of 42 lymph nodes positive.Dr. [name] marked the retroperitoneum with clips for possible radiation therapy. Postoperative course was unremarkable.   She has been recovering well from surgery. She has minimum pain at the incision site, staples removed, no infection or other complications. Her appetite and energy level have much improved, although not back to normal yet. She lost about 10 lbs in the  past 5 month, but gained a few lbs lately.   CURRENT THERAPY: Concurrent chemoradiation with Xeloda 1500 mg twice daily, started on 03/19/2016, dose reduced to 1000 mg twice daily from week 3 due to neutropenia  INTERIM HISTORY: Ms terrance returns for follow up. She is on week 5 of chemo and radiation, tolerating well overall. She has some difficulty with Xeloda pill, better with dose reduction. She  denies any fever or chills. Her appetite and energy level is fair, she has occasional right-sided abdominal cramps, bowel movements normal. No GU complaints.  MEDICAL HISTORY:  Past Medical History:  Diagnosis Date  . AAA (abdominal aortic aneurysm) ([facility])    infrarenal 4.1 cmper s-9-19 scan on chart  . Anemia   . Anxiety   . Colon cancer ([place])   . Hypertension   . Vitamin D deficiency     SURGICAL HISTORY: Past Surgical History:  Procedure Laterality Date  . COLONSCOPY  12/2015  . LAPAROSCOPIC RIGHT HEMI COLECTOMY Right 02/07/2016   Procedure: LAPAROSCOPIC ASSISTED RIGHT HEMI COLECTOMY AND RIGHT SALPINGO OOPHERECTOMY;  Surgeon: [name], MD;  Location: [facility];  Service: General;  Laterality: Right;    SOCIAL HISTORY: Social History   Social History  . Marital status: Widowed    Spouse name: N/A  . Number of children: N/A  . Years of education: N/A   Occupational History  . Not on file.   Social History Main Topics  . Smoking status: Never Smoker  . Smokeless tobacco: Never Used  . Alcohol use No  . Drug use: No  . Sexual activity: Yes    Birth control/ protection: None   Other Topics Concern  . Not on file   Social History Narrative  . No narrative on file   She is retired  She has one daughter   FAMILY HISTORY: Family History  Problem Relation Age of Onset  . Cancer Mother     lung cancer  . Cancer Maternal Grandfather     unknown cancer   . Colon cancer Neg Hx     ALLERGIES:  is allergic to fish allergy; peanut-containing drug products; soy allergy; and buspirone.  MEDICATIONS:  Current Outpatient Prescriptions  Medication Sig Dispense Refill  . acetaminophen (TYLENOL) 325 MG tablet Take 2 tablets (650 mg total) by mouth every 6 (six) hours as needed for mild pain, moderate pain, fever or headache.    . ALPRAZolam (XANAX) 0.25 MG tablet Take 0.25 mg by mouth daily as needed for anxiety.   3  . capecitabine (XELODA) 500 MG tablet Take 3  tablets (1,500 mg total) by mouth 2 (two) times daily after a meal. Take twice daily on the day of radiation only (Patient taking differently: Take 1,000 mg by mouth 2 (two) times daily after a meal. Take twice daily on the day of radiation only) 180 tablet 0  . NORVASC 2.5 MG tablet Take 2.5 mg by mouth daily.    . ondansetron (ZOFRAN) 4 MG tablet Take 1 tablet (4 mg total) by mouth every 6 (six) hours as needed for nausea. 10 tablet 0  . polyethylene glycol (MIRALAX / GLYCOLAX) packet 1 dose daily, you can buy this as any pharmacy without a prescription. 14 each 0  . Probiotic Product ([employer] PROBIOTIC PO) Take 1 capsule by mouth as directed. Takes  3 - 4 times per week per pt.    Marland Kitchen HYDROcodone-acetaminophen (NORCO/VICODIN) 5-325 MG tablet Take 1-2 tablets by mouth every 4 (four) hours as needed for  moderate pain or severe pain. (Patient not taking: Reported on 04/15/2016) 30 tablet 0  . oxyCODONE-acetaminophen (PERCOCET/ROXICET) 5-325 MG tablet Take 1-2 tablets by mouth every 4 (four) hours as needed for moderate pain. (Patient not taking: Reported on 04/15/2016) 40 tablet 0   No current facility-administered medications for this visit.     REVIEW OF SYSTEMS:   Constitutional: Denies fevers, chills or abnormal night sweats Eyes: Denies blurriness of vision, double vision or watery eyes Ears, nose, mouth, throat, and face: Denies mucositis or sore throat Respiratory: Denies cough, dyspnea or wheezes Cardiovascular: Denies palpitation, chest discomfort or lower extremity swelling Gastrointestinal:  Denies nausea, heartburn or change in bowel habits Skin: Denies abnormal skin rashes Lymphatics: Denies new lymphadenopathy or easy bruising Neurological:Denies numbness, tingling or new weaknesses Behavioral/Psych: Mood is stable, no new changes  All other systems were reviewed with the patient and are negative.  PHYSICAL EXAMINATION: ECOG PERFORMANCE STATUS: 2  Vitals:   04/15/16 1406  BP:  134/64  Pulse: 87  Resp: 18  Temp: 98 F (36.7 C)   Filed Weights   04/15/16 1406  Weight: 120 lb 3.2 oz (54.5 kg)    GENERAL:alert, no distress and comfortable SKIN: skin color, texture, turgor are normal, no rashes or significant lesions EYES: normal, conjunctiva are pink and non-injected, sclera clear OROPHARYNX:no exudate, no erythema and lips, buccal mucosa, and tongue normal  NECK: supple, thyroid normal size, non-tender, without nodularity LYMPH:  no palpable lymphadenopathy in the cervical, axillary or inguinal LUNGS: clear to auscultation and percussion with normal breathing effort HEART: regular rate & rhythm and no murmurs and no lower extremity edema ABDOMEN:abdomen soft, non-tender and normal bowel sounds, surgical wound has healed well. Musculoskeletal:no cyanosis of digits and no clubbing  PSYCH: alert & oriented x 3 with fluent speech NEURO: no focal motor/sensory deficits  LABORATORY DATA:   I have reviewed the data as listed CBC Latest Ref Rng & Units 04/15/2016 04/09/2016 04/02/2016  WBC 3.9 - 10.3 10e3/uL 1.8(L) 2.3(L) 1.5(L)  Hemoglobin 11.6 - 15.9 g/dL 9.1(L) 8.8(L) 8.6(L)  Hematocrit 34.8 - 46.6 % 28.6(L) 27.8(L) 27.1(L)  Platelets 145 - 400 10e3/uL 150 142(L) 158   CMP Latest Ref Rng & Units 04/15/2016 04/09/2016 03/26/2016  Glucose 70 - 140 mg/dl 95 96 97  BUN 7.0 - 26.0 mg/dL 9.4 11.1 12.5  Creatinine 0.6 - 1.1 mg/dL 0.8 0.8 0.9  Sodium 136 - 145 mEq/L 140 139 137  Potassium 3.5 - 5.1 mEq/L 4.0 4.4 3.5  Chloride 101 - 111 mmol/L - - -  CO2 22 - 29 mEq/L '25 25 26  ' Calcium 8.4 - 10.4 mg/dL 8.9 9.1 9.6  Total Protein 6.4 - 8.3 g/dL 6.2(L) 6.5 7.6  Total Bilirubin 0.20 - 1.20 mg/dL 0.25 <0.30 0.58  Alkaline Phos 40 - 150 U/L 42 44 59  AST 5 - 34 U/L '14 13 16  ' ALT 0 - 55 U/L 7 <9 10   Results for EMMIE, FRAKES (MRN [id number]) as of 03/18/2016 07:12  Ref. Range 02/27/2016 16:15  Iron Latest Ref Range: 41 - 142 ug/dL 26 (L)  UIBC Latest Ref Range:  120 - 384 ug/dL 333  TIBC Latest Ref Range: 236 - 444 ug/dL 359  %SAT Latest Ref Range: 21 - 57 % 7 (L)  Ferritin Latest Ref Range: 9 - 269 ng/ml 29   Pathology report  Diagnosis 02/07/2016 1. Colon, segmental resection for tumor, right - INVASIVE MODERATELY DIFFERENTIATED ADENOCARCINOMA, SPANNING 5 CM IN GREATEST DIMENSION. - TUMOR  ARISES FROM THE CECUM AND INVADES THROUGH THE FULL THICKNESS OF THE BOWEL AND PERFORATES THE SEROSA. - PROXIMAL AND DISTAL MARGINS ARE NEGATIVE (PLEASE SEE SPECIMEN #2 FOR FINAL MARGIN STATUS). - TWO OF FORTY-THREE LYMPH NODES POSITIVE FOR METASTATIC COLORECTAL ADENOCARCINOMA (2/43). - SEE ONCOLOGY TEMPLATE. 2. Soft tissue mass, simple excision, pelvic side wall - POSITIVE FOR COLORECTAL ADENOCARCINOMA. - MARGIN IS BROADLY POSITIVE. 3. Adnexa - ovary +/- tube, neoplastic, right - OVARIAN FIBROMA WITH ASSOCIATED CALCIFICATIONS. - BENIGN FALLOPIAN TUBE - NO ATYPIA OR MALIGNANCY IDENTIFIED. - SEE COMMENT. Microscopic Comment 1. COLON AND RECTUM (INCLUDING TRANS-ANAL RESECTION): Specimen: Right partial colon with additional right pelvic sidewall tissue. Procedure: Right hemicolectomy with lateral pelvic sidewall excision. Tumor site: Cecum. Specimen integrity: Intact. Macroscopic intactness of mesorectum: Not applicable. Macroscopic tumor perforation: Yes, adenocarcinoma macroscopically perforates serosa. Invasive tumor: Maximum size: 5 cm. Histologic type(s): Adenocarcinoma. Histologic grade and differentiation G2: moderately differentiated/low grade. Type of polyp in which invasive carcinoma arose: A definitive precursor polyp is not identified. Microscopic extension of invasive tumor: Tumor invades through the entire colon and perforates the serosa. Lymph-Vascular invasion: Definitive lymph/vascular invasion is not identified, however there is lymph node positivity, see below Peri-neural invasion: Not identified. Tumor deposit(s) (discontinuous  extramural extension): No tumor deposits are identified. Resection margins: Proximal margin: Negative. Distal margin: Negative. Circumferential (radial) (posterior ascending, posterior descending; lateral and posterior mid-rectum; and entire lower 1/3 rectum): Tumor involves pelvic sidewall margin (positive margin), see below comment. Mesenteric margin (sigmoid and transverse): Not applicable. Distance closest margin (if all above margins negative): Tumor invades into pelvic sidewall with positive pelvic sidewall margin, see below comment. Trans-anal resection margins only: Deep margin: Not applicable. Mucosal Margin: Not applicable. Distance closest mucosal margin (if negative): Not applicable. Treatment effect (neo-adjuvant therapy): Not applicable. Additional polyp(s): The possible mucosal polyp seen grossly simply demonstrates benign polypoid colorectal mucosa without a true polyp identified. Non-neoplastic findings: No additional significant non-neoplastic findings. Lymph nodes: number examined 43; number positive: 2. Pathologic Staging: pT4b, pN1b, R2, see below comment. Ancillary studies: The tumor will be sent for MSI by PCR and MMR by [facility] per colorectal cancer protocol. Comments: As the tumor is perforated with invasion into the pelvic sidewall, this is assessed as representing direct extension into another structure and is therefore compatible with pT4b tumor. The margin as indicated by the surgeon of the pelvic sidewall is grossly positive on pathologic gross examination and shows broad positivity on microscopic examination. The findings are consistent with the above assessment. The case was discussed with Dr. [name] on 02/09/2016. 3. Of note, the right fallopian tube in the third specimen demonstrates findings suggestive of previous tubal ligation. Please correlate with surgical history. (RH:kh 02-09-16) Willeen Niece MD Pathologist, Electronic Signature  1. Mismatch  Repair (MMR) Protein Immunohistochemistry (IHC) IHC Expression Result: MLH1: Preserved nuclear expression (greater 50% tumor expression) MSH2: Preserved nuclear expression (greater 50% tumor expression) MSH6: Preserved nuclear expression (greater 50% tumor expression) PMS2: Preserved nuclear expression (greater 50% tumor expression) * Internal control demonstrates intact nuclear expression     RADIOGRAPHIC STUDIES: I have personally reviewed the radiological images as listed and agreed with the findings in the report. No results found.  ASSESSMENT & PLAN:  65 year old female, with past medical history of hypertension and abdominal aorta aneurysm, presented with perforation right cecum colon cancer.  1. Cancer of ascending colon, pT4bN1bM0, stage IIIC, MSI-stable, (+) surgical margins at pelvic wall  -I previously discussed her surgical pathology report and prior CT scan findings in  great details with patient and her daughter.  -I reviewed her staging CT chest, which was negative for metastasis. --Due to the locally advanced disease, perforated tumor, and positive surgical margins, she is at extremely high risk for cancer recurrence. -Her case was discussed in our tumor board, we recommend adjuvant chemotherapy and irradiation -She has started concurrent chemoradiation, has developed moderate fatigue, mild dizziness. -Due to her neutropenia, her Xeloda dose has been reduced. -She is overall tolerating well, we'll continue. -We discussed adjuvant chemotherapy after she completes radiation. Due to her baseline neutropenia, I strongly recommend her to consider FOLFOX, instead of CAPOX, so that he use Neulasta to support her. She is little reluctant to have a port placement, will think about it.   2. HTN -She'll follow-up with her primary care physician and continue medication -we discussed that chemotherapy may affect her blood pressure, we'll monitor her pressures closely -Her blood  pressure is lower than her baseline, she also has some mild dizziness. I told her to hold Maxide, and monitor her blood pressure at home  3. Anemia -Her anemia is related to her surgery and iron deficiency. Iron studies showed evidence of iron deficiency. -She previously took iron pill before, and had some constipation. She is agreeable to restart ferrous sulfate 1-2 tablets a day. Constipation management reviewed with her. I encouraged her to take the iron pill with orange juice oh vitamin C to improve the absorption, but she has not started yet -IV feraheme in a few weeks   Plan -Lab reviewed, we'll continue Xeloda to 1013m twice daily with concurrent radiation -I'll see her back next week  All questions were answered. The patient knows to call the clinic with any problems, questions or concerns. I spent 15 minutes counseling the patient face to face. The total time spent in the appointment was 20 minutes and more than 50% was on counseling.     FTruitt Merle MD 04/15/2016

## 2016-04-16 ENCOUNTER — Ambulatory Visit
Admission: RE | Admit: 2016-04-16 | Discharge: 2016-04-16 | Disposition: A | Discharge status: Home / Self Care | Payer: Federal, State, Local not specified - PPO | Source: Ambulatory Visit | Attending: Radiation Oncology | Admitting: Radiation Oncology

## 2016-04-16 ENCOUNTER — Encounter: Payer: Self-pay | Admitting: Hematology

## 2016-04-16 ENCOUNTER — Ambulatory Visit: Payer: Federal, State, Local not specified - PPO

## 2016-04-16 ENCOUNTER — Other Ambulatory Visit: Payer: Federal, State, Local not specified - PPO

## 2016-04-16 DIAGNOSIS — Z510 Encounter for antineoplastic radiation therapy: Secondary | ICD-10-CM | POA: Diagnosis not present

## 2016-04-17 ENCOUNTER — Encounter: Payer: Self-pay | Admitting: Radiation Oncology

## 2016-04-17 ENCOUNTER — Ambulatory Visit
Admission: RE | Admit: 2016-04-17 | Discharge: 2016-04-17 | Disposition: A | Discharge status: Home / Self Care | Payer: Federal, State, Local not specified - PPO | Source: Ambulatory Visit | Attending: Radiation Oncology | Admitting: Radiation Oncology

## 2016-04-17 ENCOUNTER — Ambulatory Visit: Payer: Federal, State, Local not specified - PPO

## 2016-04-17 VITALS — BP 106/60 | HR 79 | Temp 98.5°F | Resp 20 | Wt 118.4 lb

## 2016-04-17 DIAGNOSIS — C182 Malignant neoplasm of ascending colon: Secondary | ICD-10-CM

## 2016-04-17 DIAGNOSIS — Z510 Encounter for antineoplastic radiation therapy: Secondary | ICD-10-CM | POA: Diagnosis not present

## 2016-04-17 NOTE — Progress Notes (Signed)
Department of Radiation Oncology  Phone:  [phone] Fax:        [phone]  Weekly Treatment Note    Name: Brittney Spencer Date: 04/17/2016 MRN: [id number] DOB: October 22, 1950   Diagnosis:     ICD-9-CM ICD-10-CM   1. Cancer of ascending colon (HCC) 153.6 C18.2      Current dose: 39.6 Gy  Current fraction:  22   MEDICATIONS: Current Outpatient Prescriptions  Medication Sig Dispense Refill  . ALPRAZolam (XANAX) 0.25 MG tablet Take 0.25 mg by mouth daily as needed for anxiety.   3  . capecitabine (XELODA) 500 MG tablet Take 3 tablets (1,500 mg total) by mouth 2 (two) times daily after a meal. Take twice daily on the day of radiation only (Patient taking differently: Take 1,000 mg by mouth 2 (two) times daily after a meal. Take twice daily on the day of radiation only) 180 tablet 0  . NORVASC 2.5 MG tablet Take 2.5 mg by mouth daily.    . polyethylene glycol (MIRALAX / GLYCOLAX) packet 1 dose daily, you can buy this as any pharmacy without a prescription. 14 each 0  . Probiotic Product ([employer] PROBIOTIC PO) Take 1 capsule by mouth as directed. Takes  3 - 4 times per week per pt.    Marland Kitchen acetaminophen (TYLENOL) 325 MG tablet Take 2 tablets (650 mg total) by mouth every 6 (six) hours as needed for mild pain, moderate pain, fever or headache. (Patient not taking: Reported on 04/17/2016)    . HYDROcodone-acetaminophen (NORCO/VICODIN) 5-325 MG tablet Take 1-2 tablets by mouth every 4 (four) hours as needed for moderate pain or severe pain. (Patient not taking: Reported on 04/17/2016) 30 tablet 0  . ondansetron (ZOFRAN) 4 MG tablet Take 1 tablet (4 mg total) by mouth every 6 (six) hours as needed for nausea. (Patient not taking: Reported on 04/17/2016) 10 tablet 0  . oxyCODONE-acetaminophen (PERCOCET/ROXICET) 5-325 MG tablet Take 1-2 tablets by mouth every 4 (four) hours as needed for moderate pain. (Patient not taking: Reported on 04/17/2016) 40 tablet 0   No current facility-administered  medications for this encounter.      ALLERGIES: Fish allergy; Peanut-containing drug products; Soy allergy; and Buspirone   LABORATORY DATA:  Lab Results  Component Value Date   WBC 1.8 (L) 04/15/2016   HGB 9.1 (L) 04/15/2016   HCT 28.6 (L) 04/15/2016   MCV 77.1 (L) 04/15/2016   PLT 150 04/15/2016   Lab Results  Component Value Date   NA 140 04/15/2016   K 4.0 04/15/2016   CL 106 02/09/2016   CO2 25 04/15/2016   Lab Results  Component Value Date   ALT 7 04/15/2016   AST 14 04/15/2016   ALKPHOS 42 04/15/2016   BILITOT 0.25 04/15/2016     NARRATIVE: [name] was seen today for weekly treatment management. The chart was checked and the patient's films were reviewed.  Weekly radiation to the colon/pelvis 22/28 completed. Nausea this morning. Has some anxiety before taking xeloda bid. Has soft bowel movements. Some abdominal gas. Had spicy chicken last night and thinks that may have caused the nausea. Mild fatigue.  BP 106/60 (BP Location: Right Leg, Patient Position: Sitting, Cuff Size: Normal)   Pulse 79   Temp 98.5 F (36.9 C) (Oral)   Resp 20   Wt 118 lb 6.4 oz (53.7 kg)   BMI 19.70 kg/m   PHYSICAL EXAMINATION: weight is 118 lb 6.4 oz (53.7 kg). Her oral temperature is 98.5 F (36.9 C).  Her blood pressure is 106/60 and her pulse is 79. Her respiration is 20.        ASSESSMENT: The patient is doing satisfactorily with treatment.  PLAN: We will continue with the patient's radiation treatment as planned.   ------------------------------------------------  [name], MD, PhD  This document serves as a record of services personally performed by Kyung Rudd, MD. It was created on his behalf by Darcus Austin, a trained medical scribe. The creation of this record is based on the scribe's personal observations and the provider's statements to them. This document has been checked and approved by the attending provider.

## 2016-04-17 NOTE — Progress Notes (Signed)
weekly radiation  Colon/pelvis 22/28 completed, nausea this am, has some anxiety before taking xeloda bid,  Has soft bowel movements,  Some abdominal gas,  Had spicy chicken last night, maybe that caused her nausea this am stated by patient, mild fatigue 2:57 PM BP 106/60 (BP Location: Right Leg, Patient Position: Sitting, Cuff Size: Normal)   Pulse 79   Temp 98.5 F (36.9 C) (Oral)   Resp 20   Wt 118 lb 6.4 oz (53.7 kg)   BMI 19.70 kg/m  Wt Readings from Last 3 Encounters:  04/17/16 118 lb 6.4 oz (53.7 kg)  04/15/16 120 lb 3.2 oz (54.5 kg)  04/11/16 120 lb 9.6 oz (54.7 kg)

## 2016-04-18 ENCOUNTER — Ambulatory Visit: Payer: Federal, State, Local not specified - PPO

## 2016-04-18 ENCOUNTER — Ambulatory Visit
Admission: RE | Admit: 2016-04-18 | Discharge: 2016-04-18 | Disposition: A | Discharge status: Home / Self Care | Payer: Federal, State, Local not specified - PPO | Source: Ambulatory Visit | Attending: Radiation Oncology | Admitting: Radiation Oncology

## 2016-04-18 ENCOUNTER — Telehealth: Payer: Self-pay | Admitting: *Deleted

## 2016-04-18 ENCOUNTER — Encounter: Payer: Self-pay | Admitting: Radiation Oncology

## 2016-04-18 DIAGNOSIS — Z510 Encounter for antineoplastic radiation therapy: Secondary | ICD-10-CM | POA: Diagnosis not present

## 2016-04-18 NOTE — Telephone Encounter (Signed)
Oncology Nurse Navigator Documentation  Oncology Nurse Navigator Flowsheets 04/18/2016  Navigator Location [facility]  Referral date to RadOnc/MedOnc -  Navigator Encounter Type Telephone  Telephone Outgoing Call;Patient Update  Abnormal Finding Date -  Confirmed Diagnosis Date -  Surgery Date -  Treatment Initiated Date 03/19/2016  Patient Visit Type -  Treatment Phase Active Tx  Barriers/Navigation Needs No barriers at this time;No Questions;No Needs--reports eating well, no diarrhea.  Education -  Interventions Education: reviewed neutropenic precautions with her  Coordination of Care -  Education Method -  Support Groups/Services -  Acuity Level 1  Time Spent with Patient 15

## 2016-04-19 ENCOUNTER — Ambulatory Visit
Admission: RE | Admit: 2016-04-19 | Discharge: 2016-04-19 | Disposition: A | Discharge status: Home / Self Care | Payer: Federal, State, Local not specified - PPO | Source: Ambulatory Visit | Attending: Radiation Oncology | Admitting: Radiation Oncology

## 2016-04-19 ENCOUNTER — Encounter: Payer: Self-pay | Admitting: Radiation Oncology

## 2016-04-19 ENCOUNTER — Ambulatory Visit: Payer: Federal, State, Local not specified - PPO

## 2016-04-19 DIAGNOSIS — Z510 Encounter for antineoplastic radiation therapy: Secondary | ICD-10-CM | POA: Diagnosis not present

## 2016-04-22 ENCOUNTER — Ambulatory Visit: Payer: Federal, State, Local not specified - PPO

## 2016-04-22 ENCOUNTER — Telehealth: Payer: Self-pay | Admitting: *Deleted

## 2016-04-22 ENCOUNTER — Ambulatory Visit
Admission: RE | Admit: 2016-04-22 | Discharge: 2016-04-22 | Disposition: A | Discharge status: Home / Self Care | Payer: Federal, State, Local not specified - PPO | Source: Ambulatory Visit | Attending: Radiation Oncology | Admitting: Radiation Oncology

## 2016-04-22 DIAGNOSIS — Z510 Encounter for antineoplastic radiation therapy: Secondary | ICD-10-CM | POA: Diagnosis not present

## 2016-04-22 NOTE — Telephone Encounter (Signed)
I called her back, she only has 3 more days of chemotherapy and the radiation left. I encouraged her to continue Xeloda, and OK to crush it (not recommended by manufacturer) and mixed with apple sauce if she is not able to swallow the whole pill. In that case, I recommend her to take 1 tab every 6 hours, instead of 2 tablets every 12 hours. Patient repeatedly stated that she has difficulty to swallow the pill, and she was told not to cut or crush it, she sounds to be upset and anxious. I am not sure if I can convince her, so I also told her OK not to take xeloda for the rest of 3 days of radiation, if she could not swallow, and if she is not willing to crush it.   [name] MD

## 2016-04-22 NOTE — Telephone Encounter (Signed)
"  I ate my breakfast and tried to take the pill I'm to take Mon - Fri with radiation.  I cannot take the chemotherapy pill.  It has a bad taste that makes me gag and discolors/darkens my tongue.  It's a lager size pill.  It's emotional and if I throw it up it will do me no good anyway.  Return number [phone]."

## 2016-04-22 NOTE — Telephone Encounter (Signed)
Med onc should discuss this if its xeloda

## 2016-04-23 ENCOUNTER — Other Ambulatory Visit (HOSPITAL_BASED_OUTPATIENT_CLINIC_OR_DEPARTMENT_OTHER): Payer: Federal, State, Local not specified - PPO

## 2016-04-23 ENCOUNTER — Ambulatory Visit: Payer: Federal, State, Local not specified - PPO

## 2016-04-23 ENCOUNTER — Encounter: Payer: Self-pay | Admitting: Hematology

## 2016-04-23 ENCOUNTER — Ambulatory Visit (HOSPITAL_BASED_OUTPATIENT_CLINIC_OR_DEPARTMENT_OTHER): Payer: Medicare Other | Admitting: Hematology

## 2016-04-23 ENCOUNTER — Ambulatory Visit
Admission: RE | Admit: 2016-04-23 | Discharge: 2016-04-23 | Disposition: A | Discharge status: Home / Self Care | Payer: Federal, State, Local not specified - PPO | Source: Ambulatory Visit | Attending: Radiation Oncology | Admitting: Radiation Oncology

## 2016-04-23 ENCOUNTER — Encounter: Payer: Self-pay | Admitting: Radiation Oncology

## 2016-04-23 VITALS — BP 110/75 | HR 87 | Temp 98.3°F | Resp 17 | Ht 65.0 in | Wt 119.0 lb

## 2016-04-23 DIAGNOSIS — C182 Malignant neoplasm of ascending colon: Secondary | ICD-10-CM

## 2016-04-23 DIAGNOSIS — I10 Essential (primary) hypertension: Secondary | ICD-10-CM

## 2016-04-23 DIAGNOSIS — D500 Iron deficiency anemia secondary to blood loss (chronic): Secondary | ICD-10-CM

## 2016-04-23 DIAGNOSIS — D509 Iron deficiency anemia, unspecified: Secondary | ICD-10-CM

## 2016-04-23 DIAGNOSIS — Z510 Encounter for antineoplastic radiation therapy: Secondary | ICD-10-CM | POA: Diagnosis not present

## 2016-04-23 LAB — COMPREHENSIVE METABOLIC PANEL
ALK PHOS: 53 U/L (ref 40–150)
ALT: 8 U/L (ref 0–55)
ANION GAP: 7 meq/L (ref 3–11)
AST: 14 U/L (ref 5–34)
Albumin: 2.9 g/dL — ABNORMAL LOW (ref 3.5–5.0)
BUN: 11.3 mg/dL (ref 7.0–26.0)
CALCIUM: 8.8 mg/dL (ref 8.4–10.4)
CO2: 26 mEq/L (ref 22–29)
CREATININE: 0.8 mg/dL (ref 0.6–1.1)
Chloride: 107 mEq/L (ref 98–109)
Glucose: 103 mg/dl (ref 70–140)
Potassium: 4.5 mEq/L (ref 3.5–5.1)
Sodium: 140 mEq/L (ref 136–145)
TOTAL PROTEIN: 5.8 g/dL — AB (ref 6.4–8.3)

## 2016-04-23 LAB — CBC & DIFF AND RETIC
BASO%: 0.4 % (ref 0.0–2.0)
BASOS ABS: 0 10*3/uL (ref 0.0–0.1)
EOS%: 27 % — ABNORMAL HIGH (ref 0.0–7.0)
Eosinophils Absolute: 0.7 10*3/uL — ABNORMAL HIGH (ref 0.0–0.5)
HEMATOCRIT: 29.2 % — AB (ref 34.8–46.6)
HEMOGLOBIN: 9.4 g/dL — AB (ref 11.6–15.9)
IMMATURE RETIC FRACT: 10.5 % — AB (ref 1.60–10.00)
LYMPH%: 19.5 % (ref 14.0–49.7)
MCH: 25.3 pg (ref 25.1–34.0)
MCHC: 32.2 g/dL (ref 31.5–36.0)
MCV: 78.7 fL — AB (ref 79.5–101.0)
MONO#: 0.3 10*3/uL (ref 0.1–0.9)
MONO%: 12.5 % (ref 0.0–14.0)
NEUT#: 1 10*3/uL — ABNORMAL LOW (ref 1.5–6.5)
NEUT%: 40.6 % (ref 38.4–76.8)
PLATELETS: 210 10*3/uL (ref 145–400)
RBC: 3.71 10*6/uL (ref 3.70–5.45)
RDW: 21.8 % — ABNORMAL HIGH (ref 11.2–14.5)
Retic %: 1.15 % (ref 0.70–2.10)
Retic Ct Abs: 42.67 10*3/uL (ref 33.70–90.70)
WBC: 2.6 10*3/uL — ABNORMAL LOW (ref 3.9–10.3)
lymph#: 0.5 10*3/uL — ABNORMAL LOW (ref 0.9–3.3)

## 2016-04-23 LAB — CEA (IN HOUSE-CHCC): CEA (CHCC-IN HOUSE): 1.4 ng/mL (ref 0.00–5.00)

## 2016-04-23 NOTE — Progress Notes (Signed)
[place]  [phone] [phone]  Clinic Follow Up Note   Patient Care Team: Pcp Not In System as PCP - General 04/23/2016  CHIEF COMPLAINTS:  Follow up stage IIIC right colon cancer   Oncology History   Cancer of ascending colon [facility])   Staging form: Colon and Rectum, AJCC 7th Edition   - Clinical stage from 02/07/2016: Stage IIIC (T4b, N1b, M0) - Signed by [name], MD on 03/04/2016      Cancer of ascending colon ([place])   10/25/2015 Imaging    CT ABD/PELVIS:  Inflammatory changes inferior to the cecal tip appear improved, there is still irregular soft tissue thickening of the cecal tip, and there are adjacent prominent lymph nodes in the ileocolonic mesentery, measuring 13 mm on image 49 and 8 mm on image 52. In addition, there is a 2.5 x 1.8 cm nodule on image 46 which has central low density. Therefore, these findings are moderately suspicious for an underlying cecal malignancy with perforation.       01/19/2016 Procedure    COLONOSCOPY per Dr. [name]: Fungating, ulcerated mass almost obstructing mid ascending colon      02/05/2016 Tumor Marker    Patient's tumor was tested for the following markers: CEA Results of the tumor marker test revealed 5.7.      02/07/2016 Initial Diagnosis    Cancer of ascending colon ([place])     02/07/2016 Definitive Surgery    Laparoscopic assisted right hemicolectomy and right salpingo oopherectomy--Dr. [name]      02/07/2016 Pathologic Stage    p T4 N1b   2/43 nodes +      02/07/2016 Pathology Results    MMR normal; G2 adenocarcinoma;proximal & distal margins negative; soft tissue mass on pelvic sidewall + for adenocarcinoma with positive margin MSI Stable      03/08/2016 Imaging    CT chest negative for metastasis.        HISTORY OF PRESENTING ILLNESS:  Brittney Spencer 65 y.o. female is here because of her newly diagnsosed ascending colon cancer. She is accompanied by her daughter to my clinic  today.   She initially presented with vaginal discharge and gastric discomfot., and was seen by her GYN and underwent CT scan which showedA perforated appendicitis. She was treated nonoperatively with IV antibiotics with resolution of her symptoms. Posttreatment CT scan questioned  Some thickening of the cecum, question neoplasm. Colonoscopy was recommended and was delayed somewhat due to patient issues and revealed an obvious malignancy in the cecum with partial obstruction. Biopsy revealed adenocarcinoma. She was referred to Dr. [name] and underwent laparoscopic-assisted and possible open right hemicolectomy on 02/07/2016. Per the OR note, at laparoscopy there was a bulky somewhat fixed tumor in the cecum. There was found to be a perforated cancer with a chronic cavity invading into the posterior lateral retroperitoneum.  The cancer was very bulky and locally invasive but was grossly completely removed  With ileotransverse colostomy. Final pathology revealed a 5 cm tumor  With microscopically broadly positive lateral margin in the retroperitoneum (grossly negative by inspection of the time of surgery) and 2 out of 42 lymph nodes positive.Dr. [name] marked the retroperitoneum with clips for possible radiation therapy. Postoperative course was unremarkable.   She has been recovering well from surgery. She has minimum pain at the incision site, staples removed, no infection or other complications. Her appetite and energy level have much improved, although not back to normal yet. She lost about 10 lbs in the  past 5 month, but gained a few lbs lately.   CURRENT THERAPY: Concurrent chemoradiation with Xeloda 1500 mg twice daily, started on 03/19/2016, dose reduced to 1000 mg twice daily from week 3 due to neutropenia  INTERIM HISTORY: Ms balinski returns for follow up. She is on last week of chemo and radiation. She has been having difficulty to swallow the Xeloda pill in the past few days, she gagged, and  feels about taking the pupil. I called her yesterday and had a long discussion with her, she and she decided not to take the pill for the rest (3 days) of the radiation. She feels better this morning, with better appetite and ate breakfast well. She denies any fever, chills, or other new symptoms. She has mild to moderate fatigue, able to function well at home.  MEDICAL HISTORY:  Past Medical History:  Diagnosis Date  . AAA (abdominal aortic aneurysm) (HCC)    infrarenal 4.1 cmper s-9-19 scan on chart  . Anemia   . Anxiety   . Colon cancer ([place])   . Hypertension   . Vitamin D deficiency     SURGICAL HISTORY: Past Surgical History:  Procedure Laterality Date  . COLONSCOPY  12/2015  . LAPAROSCOPIC RIGHT HEMI COLECTOMY Right 02/07/2016   Procedure: LAPAROSCOPIC ASSISTED RIGHT HEMI COLECTOMY AND RIGHT SALPINGO OOPHERECTOMY;  Surgeon: [name], MD;  Location: [facility];  Service: General;  Laterality: Right;    SOCIAL HISTORY: Social History   Social History  . Marital status: Widowed    Spouse name: N/A  . Number of children: N/A  . Years of education: N/A   Occupational History  . Not on file.   Social History Main Topics  . Smoking status: Never Smoker  . Smokeless tobacco: Never Used  . Alcohol use No  . Drug use: No  . Sexual activity: Yes    Birth control/ protection: None   Other Topics Concern  . Not on file   Social History Narrative  . No narrative on file   She is retired  She has one daughter   FAMILY HISTORY: Family History  Problem Relation Age of Onset  . Cancer Mother     lung cancer  . Cancer Maternal Grandfather     unknown cancer   . Colon cancer Neg Hx     ALLERGIES:  is allergic to fish allergy; peanut-containing drug products; soy allergy; and buspirone.  MEDICATIONS:  Current Outpatient Prescriptions  Medication Sig Dispense Refill  . acetaminophen (TYLENOL) 325 MG tablet Take 2 tablets (650 mg total) by mouth every 6 (six) hours  as needed for mild pain, moderate pain, fever or headache. (Patient not taking: Reported on 04/17/2016)    . ALPRAZolam (XANAX) 0.25 MG tablet Take 0.25 mg by mouth daily as needed for anxiety.   3  . capecitabine (XELODA) 500 MG tablet Take 3 tablets (1,500 mg total) by mouth 2 (two) times daily after a meal. Take twice daily on the day of radiation only (Patient taking differently: Take 1,000 mg by mouth 2 (two) times daily after a meal. Take twice daily on the day of radiation only) 180 tablet 0  . HYDROcodone-acetaminophen (NORCO/VICODIN) 5-325 MG tablet Take 1-2 tablets by mouth every 4 (four) hours as needed for moderate pain or severe pain. (Patient not taking: Reported on 04/17/2016) 30 tablet 0  . NORVASC 2.5 MG tablet Take 2.5 mg by mouth daily.    . ondansetron (ZOFRAN) 4 MG tablet Take 1 tablet (4  mg total) by mouth every 6 (six) hours as needed for nausea. (Patient not taking: Reported on 04/17/2016) 10 tablet 0  . oxyCODONE-acetaminophen (PERCOCET/ROXICET) 5-325 MG tablet Take 1-2 tablets by mouth every 4 (four) hours as needed for moderate pain. (Patient not taking: Reported on 04/17/2016) 40 tablet 0  . polyethylene glycol (MIRALAX / GLYCOLAX) packet 1 dose daily, you can buy this as any pharmacy without a prescription. 14 each 0  . Probiotic Product ([employer] PROBIOTIC PO) Take 1 capsule by mouth as directed. Takes  3 - 4 times per week per pt.     No current facility-administered medications for this visit.     REVIEW OF SYSTEMS:   Constitutional: Denies fevers, chills or abnormal night sweats Eyes: Denies blurriness of vision, double vision or watery eyes Ears, nose, mouth, throat, and face: Denies mucositis or sore throat Respiratory: Denies cough, dyspnea or wheezes Cardiovascular: Denies palpitation, chest discomfort or lower extremity swelling Gastrointestinal:  Denies nausea, heartburn or change in bowel habits Skin: Denies abnormal skin rashes Lymphatics: Denies new  lymphadenopathy or easy bruising Neurological:Denies numbness, tingling or new weaknesses Behavioral/Psych: Mood is stable, no new changes  All other systems were reviewed with the patient and are negative.  PHYSICAL EXAMINATION: ECOG PERFORMANCE STATUS: 2  Vitals:   04/23/16 1408  BP: 110/75  Pulse: 87  Resp: 17  Temp: 98.3 F (36.8 C)   Filed Weights   04/23/16 1408  Weight: 119 lb (54 kg)    GENERAL:alert, no distress and comfortable SKIN: skin color, texture, turgor are normal, no rashes or significant lesions EYES: normal, conjunctiva are pink and non-injected, sclera clear OROPHARYNX:no exudate, no erythema and lips, buccal mucosa, and tongue normal  NECK: supple, thyroid normal size, non-tender, without nodularity LYMPH:  no palpable lymphadenopathy in the cervical, axillary or inguinal LUNGS: clear to auscultation and percussion with normal breathing effort HEART: regular rate & rhythm and no murmurs and no lower extremity edema ABDOMEN:abdomen soft, non-tender and normal bowel sounds, surgical wound has healed well. Musculoskeletal:no cyanosis of digits and no clubbing  PSYCH: alert & oriented x 3 with fluent speech NEURO: no focal motor/sensory deficits  LABORATORY DATA:   I have reviewed the data as listed CBC Latest Ref Rng & Units 04/23/2016 04/15/2016 04/09/2016  WBC 3.9 - 10.3 10e3/uL 2.6(L) 1.8(L) 2.3(L)  Hemoglobin 11.6 - 15.9 g/dL 9.4(L) 9.1(L) 8.8(L)  Hematocrit 34.8 - 46.6 % 29.2(L) 28.6(L) 27.8(L)  Platelets 145 - 400 10e3/uL 210 150 142(L)   CMP Latest Ref Rng & Units 04/23/2016 04/15/2016 04/09/2016  Glucose 70 - 140 mg/dl 103 95 96  BUN 7.0 - 26.0 mg/dL 11.3 9.4 11.1  Creatinine 0.6 - 1.1 mg/dL 0.8 0.8 0.8  Sodium 136 - 145 mEq/L 140 140 139  Potassium 3.5 - 5.1 mEq/L 4.5 4.0 4.4  Chloride 101 - 111 mmol/L - - -  CO2 22 - 29 mEq/L _0 Calcium 8.4 - 10.4 mg/dL 8.8 8.9 9.1  Total Protein 6.4 - 8.3 g/dL 5.8(L) 6.2(L) 6.5  Total Bilirubin  0.20 - 1.20 mg/dL <0.22 0.25 <0.30  Alkaline Phos 40 - 150 U/L 53 42 44  AST 5 - 34 U/L _1 ALT 0 - 55 U/L 8 7 <9   Results for JIRAH, RIDER (MRN [id number]) as of 03/18/2016 07:12  Ref. Range 02/27/2016 16:15  Iron Latest Ref Range: 41 - 142 ug/dL 26 (L)  UIBC Latest Ref Range: 120 - 384 ug/dL 333  TIBC Latest  Ref Range: 236 - 444 ug/dL 359  %SAT Latest Ref Range: 21 - 57 % 7 (L)  Ferritin Latest Ref Range: 9 - 269 ng/ml 29   Pathology report  Diagnosis 02/07/2016 1. Colon, segmental resection for tumor, right - INVASIVE MODERATELY DIFFERENTIATED ADENOCARCINOMA, SPANNING 5 CM IN GREATEST DIMENSION. - TUMOR ARISES FROM THE CECUM AND INVADES THROUGH THE FULL THICKNESS OF THE BOWEL AND PERFORATES THE SEROSA. - PROXIMAL AND DISTAL MARGINS ARE NEGATIVE (PLEASE SEE SPECIMEN #2 FOR FINAL MARGIN STATUS). - TWO OF FORTY-THREE LYMPH NODES POSITIVE FOR METASTATIC COLORECTAL ADENOCARCINOMA (2/43). - SEE ONCOLOGY TEMPLATE. 2. Soft tissue mass, simple excision, pelvic side wall - POSITIVE FOR COLORECTAL ADENOCARCINOMA. - MARGIN IS BROADLY POSITIVE. 3. Adnexa - ovary +/- tube, neoplastic, right - OVARIAN FIBROMA WITH ASSOCIATED CALCIFICATIONS. - BENIGN FALLOPIAN TUBE - NO ATYPIA OR MALIGNANCY IDENTIFIED. - SEE COMMENT. Microscopic Comment 1. COLON AND RECTUM (INCLUDING TRANS-ANAL RESECTION): Specimen: Right partial colon with additional right pelvic sidewall tissue. Procedure: Right hemicolectomy with lateral pelvic sidewall excision. Tumor site: Cecum. Specimen integrity: Intact. Macroscopic intactness of mesorectum: Not applicable. Macroscopic tumor perforation: Yes, adenocarcinoma macroscopically perforates serosa. Invasive tumor: Maximum size: 5 cm. Histologic type(s): Adenocarcinoma. Histologic grade and differentiation G2: moderately differentiated/low grade. Type of polyp in which invasive carcinoma arose: A definitive precursor polyp is not identified. Microscopic  extension of invasive tumor: Tumor invades through the entire colon and perforates the serosa. Lymph-Vascular invasion: Definitive lymph/vascular invasion is not identified, however there is lymph node positivity, see below Peri-neural invasion: Not identified. Tumor deposit(s) (discontinuous extramural extension): No tumor deposits are identified. Resection margins: Proximal margin: Negative. Distal margin: Negative. Circumferential (radial) (posterior ascending, posterior descending; lateral and posterior mid-rectum; and entire lower 1/3 rectum): Tumor involves pelvic sidewall margin (positive margin), see below comment. Mesenteric margin (sigmoid and transverse): Not applicable. Distance closest margin (if all above margins negative): Tumor invades into pelvic sidewall with positive pelvic sidewall margin, see below comment. Trans-anal resection margins only: Deep margin: Not applicable. Mucosal Margin: Not applicable. Distance closest mucosal margin (if negative): Not applicable. Treatment effect (neo-adjuvant therapy): Not applicable. Additional polyp(s): The possible mucosal polyp seen grossly simply demonstrates benign polypoid colorectal mucosa without a true polyp identified. Non-neoplastic findings: No additional significant non-neoplastic findings. Lymph nodes: number examined 43; number positive: 2. Pathologic Staging: pT4b, pN1b, R2, see below comment. Ancillary studies: The tumor will be sent for MSI by PCR and MMR by [facility] per colorectal cancer protocol. Comments: As the tumor is perforated with invasion into the pelvic sidewall, this is assessed as representing direct extension into another structure and is therefore compatible with pT4b tumor. The margin as indicated by the surgeon of the pelvic sidewall is grossly positive on pathologic gross examination and shows broad positivity on microscopic examination. The findings are consistent with the above assessment. The case  was discussed with Dr. [name] on 02/09/2016. 3. Of note, the right fallopian tube in the third specimen demonstrates findings suggestive of previous tubal ligation. Please correlate with surgical history. (RH:kh 02-09-16) Willeen Niece MD Pathologist, Electronic Signature  1. Mismatch Repair (MMR) Protein Immunohistochemistry (IHC) IHC Expression Result: MLH1: Preserved nuclear expression (greater 50% tumor expression) MSH2: Preserved nuclear expression (greater 50% tumor expression) MSH6: Preserved nuclear expression (greater 50% tumor expression) PMS2: Preserved nuclear expression (greater 50% tumor expression) * Internal control demonstrates intact nuclear expression     RADIOGRAPHIC STUDIES: I have personally reviewed the radiological images as listed and agreed with the findings in the report. No results  found.  ASSESSMENT & PLAN:  65 year old female, with past medical history of hypertension and abdominal aorta aneurysm, presented with perforation right cecum colon cancer.  1. Cancer of ascending colon, pT4bN1bM0, stage IIIC, MSI-stable, (+) surgical margins at pelvic wall     -I previously discussed her surgical pathology report and prior CT scan findings in great details with patient and her daughter.  -I reviewed her staging CT chest, which was negative for metastasis. --Due to the locally advanced disease, perforated tumor, and positive surgical margins, she is at extremely high risk for cancer recurrence. -Her case was discussed in our tumor board, we recommend adjuvant chemotherapy and irradiation -She has started concurrent chemoradiation, has developed moderate fatigue, mild dizziness. -Due to her neutropenia, her Xeloda dose has been reduced. -She stopped the Xeloda for the last week of radiation, due to difficulty swallowing the pills  -We discussed adjuvant chemotherapy after she completes radiation. Due to her baseline neutropenia, and difficulty swallowing the  Xeloda pill, I strongly recommend her to consider FOLFOX, instead of CAPOX, so that he use Neulasta to support her. She is little reluctant to take the intensive chemo and have a port placement, will think about it, and will discuss with her daughter. -I reviewed the benefits and potential side effects from FOLFOX with her in details. -I recommend her to start adjuvant chemo in 2-3 weeks when she recovers well from radiation   2. HTN -She'll follow-up with her primary care physician and continue medication -we discussed that chemotherapy may affect her blood pressure, we'll monitor her pressures closely -I have held her Maxide due to her dizziness  3. Anemia -Her anemia is related to her surgery and iron deficiency. Iron studies showed evidence of iron deficiency. -She previously took iron pill before, and had some constipation. She is agreeable to restart ferrous sulfate 1-2 tablets a day.  -We'll consider IV Feraheme if she does not respond well to oral iron supplement  Plan -She has decided to hold Xeloda this week due to her difficulty swallowing the pills -She is scheduled to complete radiation on October 26 -She'll call me next week when she decides about adjuvant chemotherapy and a port placement, I will see her before first cycle FOLFOX in 2-3 weeks    All questions were answered. The patient knows to call the clinic with any problems, questions or concerns.  I spent 25 minutes counseling the patient face to face. The total time spent in the appointment was 30 minutes and more than 50% was on counseling.     [name], MD 04/23/2016

## 2016-04-24 ENCOUNTER — Ambulatory Visit
Admission: RE | Admit: 2016-04-24 | Discharge: 2016-04-24 | Disposition: A | Discharge status: Home / Self Care | Payer: Federal, State, Local not specified - PPO | Source: Ambulatory Visit | Attending: Radiation Oncology | Admitting: Radiation Oncology

## 2016-04-24 ENCOUNTER — Ambulatory Visit: Payer: Federal, State, Local not specified - PPO

## 2016-04-24 ENCOUNTER — Encounter: Payer: Self-pay | Admitting: Radiation Oncology

## 2016-04-24 VITALS — BP 109/80 | HR 88 | Temp 99.9°F | Resp 18 | Wt 118.4 lb

## 2016-04-24 DIAGNOSIS — C182 Malignant neoplasm of ascending colon: Secondary | ICD-10-CM

## 2016-04-24 DIAGNOSIS — Z510 Encounter for antineoplastic radiation therapy: Secondary | ICD-10-CM | POA: Diagnosis not present

## 2016-04-24 NOTE — Progress Notes (Signed)
Department of Radiation Oncology  Phone:  [phone] Fax:        [phone]  Weekly Treatment Note    Name: Brittney Spencer Date: 04/24/2016 MRN: [id number] DOB: Dec 10, 1950   Diagnosis:     ICD-9-CM ICD-10-CM   1. Cancer of ascending colon (HCC) 153.6 C18.2      Current dose: 48.6 Gy  Current fraction: 27   MEDICATIONS: Current Outpatient Prescriptions  Medication Sig Dispense Refill  . ALPRAZolam (XANAX) 0.25 MG tablet Take 0.25 mg by mouth daily as needed for anxiety.   3  . NORVASC 2.5 MG tablet Take 2.5 mg by mouth daily.    . polyethylene glycol (MIRALAX / GLYCOLAX) packet 1 dose daily, you can buy this as any pharmacy without a prescription. 14 each 0  . Probiotic Product ([employer] PROBIOTIC PO) Take 1 capsule by mouth as directed. Takes  3 - 4 times per week per pt.    Marland Kitchen acetaminophen (TYLENOL) 325 MG tablet Take 2 tablets (650 mg total) by mouth every 6 (six) hours as needed for mild pain, moderate pain, fever or headache. (Patient not taking: Reported on 04/24/2016)    . capecitabine (XELODA) 500 MG tablet Take 3 tablets (1,500 mg total) by mouth 2 (two) times daily after a meal. Take twice daily on the day of radiation only (Patient not taking: Reported on 04/24/2016) 180 tablet 0  . HYDROcodone-acetaminophen (NORCO/VICODIN) 5-325 MG tablet Take 1-2 tablets by mouth every 4 (four) hours as needed for moderate pain or severe pain. (Patient not taking: Reported on 04/24/2016) 30 tablet 0  . ondansetron (ZOFRAN) 4 MG tablet Take 1 tablet (4 mg total) by mouth every 6 (six) hours as needed for nausea. (Patient not taking: Reported on 04/24/2016) 10 tablet 0  . oxyCODONE-acetaminophen (PERCOCET/ROXICET) 5-325 MG tablet Take 1-2 tablets by mouth every 4 (four) hours as needed for moderate pain. (Patient not taking: Reported on 04/24/2016) 40 tablet 0   No current facility-administered medications for this encounter.      ALLERGIES: Fish allergy; Peanut-containing drug  products; Soy allergy; and Buspirone   LABORATORY DATA:  Lab Results  Component Value Date   WBC 2.6 (L) 04/23/2016   HGB 9.4 (L) 04/23/2016   HCT 29.2 (L) 04/23/2016   MCV 78.7 (L) 04/23/2016   PLT 210 04/23/2016   Lab Results  Component Value Date   NA 140 04/23/2016   K 4.5 04/23/2016   CL 106 02/09/2016   CO2 26 04/23/2016   Lab Results  Component Value Date   ALT 8 04/23/2016   AST 14 04/23/2016   ALKPHOS 53 04/23/2016   BILITOT <0.22 04/23/2016     NARRATIVE: Brittney Spencer was seen today for weekly treatment management. The chart was checked and the patient's films were reviewed.  Weekly rad txs  Pelvis, rectal 27/28 completed,  Not taking Xeloda has stopped taking, nausea at times,none today, no skin irritation stated, some discoloration of skin affected by radiation, appetite better,takes miralax as needed, had a soft formed stool yesterday, mild fatigue, 1 month appt with Shona Simpson PA 05/28/16 BP 109/80 (BP Location: Left Arm, Patient Position: Sitting, Cuff Size: Normal)   Pulse 88   Temp 99.9 F (37.7 C) (Oral)   Resp 18   Wt 118 lb 6.4 oz (53.7 kg)   BMI 19.70 kg/m   Wt Readings from Last 3 Encounters:  04/24/16 118 lb 6.4 oz (53.7 kg)  04/23/16 119 lb (54 kg)  04/17/16 118 lb 6.4 oz (  53.7 kg)    PHYSICAL EXAMINATION: weight is 118 lb 6.4 oz (53.7 kg). Her oral temperature is 99.9 F (37.7 C). Her blood pressure is 109/80 and her pulse is 88. Her respiration is 18.        ASSESSMENT: The patient is doing satisfactorily with treatment.  PLAN: We will continue with the patient's radiation treatment as planned. The patient will finish her final fraction tomorrow and then follow-up in our clinic in 1 month.

## 2016-04-24 NOTE — Progress Notes (Signed)
Weekly rad txs  Pelvis, rectal 27/28 completed,  Not taking Xeloda has stopped taking, nausea at times,none today, no skin irritation stated, some discoloration of skin affected by radiation, appetite better,takes miralax as needed, had a soft formed stool yesterday, mild fatigue, 1 month appt with Shona Simpson PA 05/28/16 BP 109/80 (BP Location: Left Arm, Patient Position: Sitting, Cuff Size: Normal)   Pulse 88   Temp 99.9 F (37.7 C) (Oral)   Resp 18   Wt 118 lb 6.4 oz (53.7 kg)   BMI 19.70 kg/m   Wt Readings from Last 3 Encounters:  04/24/16 118 lb 6.4 oz (53.7 kg)  04/23/16 119 lb (54 kg)  04/17/16 118 lb 6.4 oz (53.7 kg)

## 2016-04-24 NOTE — Progress Notes (Signed)
  Radiation Oncology         [phone] ________________________________  Name: Brittney Spencer MRN: [id number]  Date: 04/19/2016  DOB: June 09, 1951  COMPLEX SIMULATION  NOTE  Diagnosis: Pancreatic cancer  Narrative The patient has initially been planned to receive a course of radiation treatment to a dose of 45 gray in 25 fractions at 1.8 gray per fraction. The patient will now receive a boost to the high risk target volume for an additional 5.4 gray. This will be delivered in 3 fractions at 1.8 gray per fraction and a cone down boost technique will be utilized. To accomplish this, an additional 5 customized blocks have been designed for this purpose corresponding to a 3-D conformal tomotherapy plan. A complex isodose plan is requested to ensure that the high-risk target region receives the appropriate radiation dose and that the nearby normal structures continue to be appropriately spared. The patient's final total dose therefore will be 50.4 gray.   ________________________________ ------------------------------------------------  [name], MD, PhD

## 2016-04-25 ENCOUNTER — Ambulatory Visit
Admission: RE | Admit: 2016-04-25 | Discharge: 2016-04-25 | Disposition: A | Discharge status: Home / Self Care | Payer: Federal, State, Local not specified - PPO | Source: Ambulatory Visit | Attending: Radiation Oncology | Admitting: Radiation Oncology

## 2016-04-25 ENCOUNTER — Ambulatory Visit: Payer: Federal, State, Local not specified - PPO

## 2016-04-25 ENCOUNTER — Encounter: Payer: Self-pay | Admitting: Radiation Oncology

## 2016-04-25 DIAGNOSIS — Z510 Encounter for antineoplastic radiation therapy: Secondary | ICD-10-CM | POA: Diagnosis not present

## 2016-05-06 NOTE — Progress Notes (Signed)
Radiation Oncology         [phone]) [phone] ________________________________  Name: Brittney Spencer MRN: [id number]  Date: 04/25/2016  DOB: [birth year]  End of Treatment Note  Diagnosis:  Stage IIIC ([id number]) adenocarcinoma of the ascending colon MSI-stable, (+) surgical margins at pelvic wall  Indication for treatment:  Curative with chemotherapy  Radiation treatment dates:   03/19/16 - 04/25/16  Site/dose:   1) Right upper pelvis: 45 Gy in 25 fractions.   2) Rectal boost: 5.4 Gy in 3 fractions.  Beams/energy:   1) 3D // 6X Photon   2) Isodose Plan // 6X Photon  Narrative: The patient tolerated radiation treatment relatively well. Symptoms included nausea, soft bowel movements, abdominal gas, and mild fatigue.  Plan: The patient has completed radiation treatment. The patient will return to radiation oncology clinic for routine followup in one month. I advised them to call or return sooner if they have any questions or concerns related to their recovery or treatment.  ------------------------------------------------  [name], MD, PhD  This document serves as a record of services personally performed by Kyung Rudd, MD. It was created on his behalf by Darcus Austin, a trained medical scribe. The creation of this record is based on the scribe's personal observations and the provider's statements to them. This document has been checked and approved by the attending provider.

## 2016-05-08 ENCOUNTER — Telehealth: Payer: Self-pay | Admitting: *Deleted

## 2016-05-08 NOTE — Telephone Encounter (Signed)
Oncology Nurse Navigator Documentation  Oncology Nurse Navigator Flowsheets 05/08/2016  Navigator Location [facility]  Referral date to RadOnc/MedOnc -  Navigator Encounter Type Telephone  Telephone Patient Update--called to inquire if she has given any further thought as to how she would like to proceed since her RT is completed. She reports she has been thinking about things and would like to call navigator back tomorrow to discuss. Provided my direct# for her to call.  Abnormal Finding Date -  Confirmed Diagnosis Date -  Surgery Date -  Treatment Initiated Date -  Patient Visit Type -  Treatment Phase -  Barriers/Navigation Needs -  Education -  Interventions -  Coordination of Care -  Education Method -  Support Groups/Services -  Acuity -  Time Spent with Patient -

## 2016-05-09 ENCOUNTER — Telehealth: Payer: Self-pay | Admitting: *Deleted

## 2016-05-09 NOTE — Telephone Encounter (Signed)
Return call to navigator from [name]. She is leaning towards no adjuvant chemo and observation, but is still thinking about it and discussing with her family. She is also thinking about relocating where she will live in the future. Promises to contact office with a decision soon. Notified Dr. Burr Medico.

## 2016-05-09 NOTE — Telephone Encounter (Signed)
Thanks Manuela Schwartz.  Her risk of cancer recurrence is extremely high, I would really want her to consider adjuvant chemo. If she calls back (or if you can follow up with her early next week), please encourage her to come back to see me before Thanksgiving, hope one of her family members can come with her also, I would like to talk to her again.   [name] MD

## 2016-05-15 ENCOUNTER — Telehealth: Payer: Self-pay | Admitting: *Deleted

## 2016-05-15 DIAGNOSIS — I10 Essential (primary) hypertension: Secondary | ICD-10-CM

## 2016-05-15 MED ORDER — NORVASC 2.5 MG PO TABS: 2.5000 mg | ORAL_TABLET | Freq: Every day | ORAL | 1 refills | Status: DC

## 2016-05-15 NOTE — Telephone Encounter (Signed)
Oncology Nurse Navigator Documentation  Oncology Nurse Navigator Flowsheets 05/15/2016  Navigator Location [facility]  Referral date to RadOnc/MedOnc -  Navigator Encounter Type Telephone  Telephone Incoming Call;Medication Assistance--refill on Norvasc  Abnormal Finding Date -  Confirmed Diagnosis Date -  Surgery Date -  Treatment Initiated Date -  Patient Visit Type -  Treatment Phase -  Barriers/Navigation Needs Coordination of Care--made her aware of Dr. Ernestina Penna concern for the recurrence of her cancer. She agrees to meet with her on 11/20 at 11:30.  Education -  Interventions Other--obtained approval from Dr. Burr Medico to refill her Norvasc. She agrees to look for a new PCP either in [place] or [place], where she plans to move to in future.  Coordination of Care -  Education Method -  Support Groups/Services -  Acuity Level 1  Time Spent with Patient 15

## 2016-05-20 ENCOUNTER — Telehealth: Payer: Self-pay | Admitting: Hematology

## 2016-05-20 ENCOUNTER — Ambulatory Visit (HOSPITAL_BASED_OUTPATIENT_CLINIC_OR_DEPARTMENT_OTHER): Payer: Federal, State, Local not specified - PPO | Admitting: Hematology

## 2016-05-20 ENCOUNTER — Encounter: Payer: Self-pay | Admitting: Hematology

## 2016-05-20 VITALS — BP 137/70 | HR 83 | Temp 98.3°F | Resp 18 | Ht 65.0 in | Wt 118.7 lb

## 2016-05-20 DIAGNOSIS — D509 Iron deficiency anemia, unspecified: Secondary | ICD-10-CM | POA: Diagnosis not present

## 2016-05-20 DIAGNOSIS — I10 Essential (primary) hypertension: Secondary | ICD-10-CM | POA: Diagnosis not present

## 2016-05-20 DIAGNOSIS — C182 Malignant neoplasm of ascending colon: Secondary | ICD-10-CM | POA: Diagnosis not present

## 2016-05-20 DIAGNOSIS — D500 Iron deficiency anemia secondary to blood loss (chronic): Secondary | ICD-10-CM

## 2016-05-20 NOTE — Telephone Encounter (Signed)
Appointments scheduled per 05/20/16 los. A copy of the AVS report & appointment schedule was given to patient,per 05/20/16 los. °

## 2016-05-20 NOTE — Progress Notes (Signed)
[facility]  [phone] [phone]  Clinic Follow Up Note   Patient Care Team: Pcp Not In System as PCP - General 05/20/2016  CHIEF COMPLAINTS:  Follow up stage IIIC right colon cancer   Oncology History   Cancer of ascending colon [facility])   Staging form: Colon and Rectum, AJCC 7th Edition   - Clinical stage from 02/07/2016: Stage IIIC (T4b, N1b, M0) - Signed by [name], MD on 03/04/2016      Cancer of ascending colon ([place])   10/25/2015 Imaging    CT ABD/PELVIS:  Inflammatory changes inferior to the cecal tip appear improved, there is still irregular soft tissue thickening of the cecal tip, and there are adjacent prominent lymph nodes in the ileocolonic mesentery, measuring 13 mm on image 49 and 8 mm on image 52. In addition, there is a 2.5 x 1.8 cm nodule on image 46 which has central low density. Therefore, these findings are moderately suspicious for an underlying cecal malignancy with perforation.       01/19/2016 Procedure    COLONOSCOPY per Dr. [name]: Fungating, ulcerated mass almost obstructing mid ascending colon      02/05/2016 Tumor Marker    Patient's tumor was tested for the following markers: CEA Results of the tumor marker test revealed 5.7.      02/07/2016 Initial Diagnosis    Cancer of ascending colon ([place])     02/07/2016 Definitive Surgery    Laparoscopic assisted right hemicolectomy and right salpingo oopherectomy--Dr. [name]      02/07/2016 Pathologic Stage    p T4 N1b   2/43 nodes +      02/07/2016 Pathology Results    MMR normal; G2 adenocarcinoma;proximal & distal margins negative; soft tissue mass on pelvic sidewall + for adenocarcinoma with positive margin MSI Stable      03/08/2016 Imaging    CT chest negative for metastasis.       03/19/2016 - 04/25/2016 Radiation Therapy    Adjuvant irradiation, 50 gray in 28 fractions      03/19/2016 - 04/22/2016 Chemotherapy    Xeloda 1500 mg twice daily, started on  03/19/2016, dose reduced to 1000 mg twice daily from week 3 due to neutropenia, and patient stopped 3 days before last dose radiation due to difficulty swallowing the pill       05/20/2016 -  Adjuvant Chemotherapy    Patient declined adjuvant chemotherapy       HISTORY OF PRESENTING ILLNESS:  Brittney Spencer 65 y.o. female is here because of her newly diagnsosed ascending colon cancer. She is accompanied by her daughter to my clinic today.   She initially presented with vaginal discharge and gastric discomfot., and was seen by her GYN and underwent CT scan which showedA perforated appendicitis. She was treated nonoperatively with IV antibiotics with resolution of her symptoms. Posttreatment CT scan questioned  Some thickening of the cecum, question neoplasm. Colonoscopy was recommended and was delayed somewhat due to patient issues and revealed an obvious malignancy in the cecum with partial obstruction. Biopsy revealed adenocarcinoma. She was referred to Dr. [name] and underwent laparoscopic-assisted and possible open right hemicolectomy on 02/07/2016. Per the OR note, at laparoscopy there was a bulky somewhat fixed tumor in the cecum. There was found to be a perforated cancer with a chronic cavity invading into the posterior lateral retroperitoneum.  The cancer was very bulky and locally invasive but was grossly completely removed  With ileotransverse colostomy. Final pathology revealed a 5 cm tumor  With microscopically broadly positive lateral margin in the retroperitoneum (grossly negative by inspection of the time of surgery) and 2 out of 42 lymph nodes positive.Dr. [name] marked the retroperitoneum with clips for possible radiation therapy. Postoperative course was unremarkable.   She has been recovering well from surgery. She has minimum pain at the incision site, staples removed, no infection or other complications. Her appetite and energy level have much improved, although not back to  normal yet. She lost about 10 lbs in the past 5 month, but gained a few lbs lately.   CURRENT THERAPY: Surveillance  INTERIM HISTORY: Brittney Spencer returns for follow up. She has recovered very well from her concurrent chemotherapy and radiation. She has more energy, appetite has recovered well, she is able tolerate her routine activities without much difficulties. She really enjoys her life now, and decided not to pursue adjuvant chemotherapy.  MEDICAL HISTORY:  Past Medical History:  Diagnosis Date  . AAA (abdominal aortic aneurysm) ([facility])    infrarenal 4.1 cmper s-9-19 scan on chart  . Anemia   . Anxiety   . Colon cancer ([place])   . Hypertension   . Vitamin D deficiency     SURGICAL HISTORY: Past Surgical History:  Procedure Laterality Date  . COLONSCOPY  12/2015  . LAPAROSCOPIC RIGHT HEMI COLECTOMY Right 02/07/2016   Procedure: LAPAROSCOPIC ASSISTED RIGHT HEMI COLECTOMY AND RIGHT SALPINGO OOPHERECTOMY;  Surgeon: [name], MD;  Location: [facility];  Service: General;  Laterality: Right;    SOCIAL HISTORY: Social History   Social History  . Marital status: Widowed    Spouse name: N/A  . Number of children: N/A  . Years of education: N/A   Occupational History  . Not on file.   Social History Main Topics  . Smoking status: Never Smoker  . Smokeless tobacco: Never Used  . Alcohol use No  . Drug use: No  . Sexual activity: Yes    Birth control/ protection: None   Other Topics Concern  . Not on file   Social History Narrative  . No narrative on file   She is retired  She has one daughter   FAMILY HISTORY: Family History  Problem Relation Age of Onset  . Cancer Mother     lung cancer  . Cancer Maternal Grandfather     unknown cancer   . Colon cancer Neg Hx     ALLERGIES:  is allergic to fish allergy; peanut-containing drug products; soy allergy; and buspirone.  MEDICATIONS:  Current Outpatient Prescriptions  Medication Sig Dispense Refill  .  acetaminophen (TYLENOL) 325 MG tablet Take 2 tablets (650 mg total) by mouth every 6 (six) hours as needed for mild pain, moderate pain, fever or headache. (Patient not taking: Reported on 04/24/2016)    . ALPRAZolam (XANAX) 0.25 MG tablet Take 0.25 mg by mouth daily as needed for anxiety.   3  . capecitabine (XELODA) 500 MG tablet Take 3 tablets (1,500 mg total) by mouth 2 (two) times daily after a meal. Take twice daily on the day of radiation only (Patient not taking: Reported on 04/24/2016) 180 tablet 0  . HYDROcodone-acetaminophen (NORCO/VICODIN) 5-325 MG tablet Take 1-2 tablets by mouth every 4 (four) hours as needed for moderate pain or severe pain. (Patient not taking: Reported on 04/24/2016) 30 tablet 0  . NORVASC 2.5 MG tablet Take 1 tablet (2.5 mg total) by mouth daily. 90 tablet 1  . ondansetron (ZOFRAN) 4 MG tablet Take 1 tablet (4 mg total) by  mouth every 6 (six) hours as needed for nausea. (Patient not taking: Reported on 04/24/2016) 10 tablet 0  . oxyCODONE-acetaminophen (PERCOCET/ROXICET) 5-325 MG tablet Take 1-2 tablets by mouth every 4 (four) hours as needed for moderate pain. (Patient not taking: Reported on 04/24/2016) 40 tablet 0  . polyethylene glycol (MIRALAX / GLYCOLAX) packet 1 dose daily, you can buy this as any pharmacy without a prescription. 14 each 0  . Probiotic Product ([employer] PROBIOTIC PO) Take 1 capsule by mouth as directed. Takes  3 - 4 times per week per pt.     No current facility-administered medications for this visit.     REVIEW OF SYSTEMS:   Constitutional: Denies fevers, chills or abnormal night sweats Eyes: Denies blurriness of vision, double vision or watery eyes Ears, nose, mouth, throat, and face: Denies mucositis or sore throat Respiratory: Denies cough, dyspnea or wheezes Cardiovascular: Denies palpitation, chest discomfort or lower extremity swelling Gastrointestinal:  Denies nausea, heartburn or change in bowel habits Skin: Denies abnormal skin  rashes Lymphatics: Denies new lymphadenopathy or easy bruising Neurological:Denies numbness, tingling or new weaknesses Behavioral/Psych: Mood is stable, no new changes  All other systems were reviewed with the patient and are negative.  PHYSICAL EXAMINATION: ECOG PERFORMANCE STATUS: 1  Vitals:   05/20/16 1154  BP: 137/70  Pulse: 83  Resp: 18  Temp: 98.3 F (36.8 C)   Filed Weights   05/20/16 1154  Weight: 118 lb 11.2 oz (53.8 kg)    GENERAL:alert, no distress and comfortable SKIN: skin color, texture, turgor are normal, no rashes or significant lesions EYES: normal, conjunctiva are pink and non-injected, sclera clear OROPHARYNX:no exudate, no erythema and lips, buccal mucosa, and tongue normal  NECK: supple, thyroid normal size, non-tender, without nodularity LYMPH:  no palpable lymphadenopathy in the cervical, axillary or inguinal LUNGS: clear to auscultation and percussion with normal breathing effort HEART: regular rate & rhythm and no murmurs and no lower extremity edema ABDOMEN:abdomen soft, non-tender and normal bowel sounds, surgical wound has healed well. Musculoskeletal:no cyanosis of digits and no clubbing  PSYCH: alert & oriented x 3 with fluent speech NEURO: no focal motor/sensory deficits  LABORATORY DATA:   I have reviewed the data as listed CBC Latest Ref Rng & Units 04/23/2016 04/15/2016 04/09/2016  WBC 3.9 - 10.3 10e3/uL 2.6(L) 1.8(L) 2.3(L)  Hemoglobin 11.6 - 15.9 g/dL 9.4(L) 9.1(L) 8.8(L)  Hematocrit 34.8 - 46.6 % 29.2(L) 28.6(L) 27.8(L)  Platelets 145 - 400 10e3/uL 210 150 142(L)   CMP Latest Ref Rng & Units 04/23/2016 04/15/2016 04/09/2016  Glucose 70 - 140 mg/dl 103 95 96  BUN 7.0 - 26.0 mg/dL 11.3 9.4 11.1  Creatinine 0.6 - 1.1 mg/dL 0.8 0.8 0.8  Sodium 136 - 145 mEq/L 140 140 139  Potassium 3.5 - 5.1 mEq/L 4.5 4.0 4.4  Chloride 101 - 111 mmol/L - - -  CO2 22 - 29 mEq/L _0 Calcium 8.4 - 10.4 mg/dL 8.8 8.9 9.1  Total Protein 6.4 - 8.3  g/dL 5.8(L) 6.2(L) 6.5  Total Bilirubin 0.20 - 1.20 mg/dL <0.22 0.25 <0.30  Alkaline Phos 40 - 150 U/L 53 42 44  AST 5 - 34 U/L _1 ALT 0 - 55 U/L 8 7 <9   Results for Brittney Spencer, Brittney Spencer (MRN [id number]) as of 03/18/2016 07:12  Ref. Range 02/27/2016 16:15  Iron Latest Ref Range: 41 - 142 ug/dL 26 (L)  UIBC Latest Ref Range: 120 - 384 ug/dL 333  TIBC Latest Ref  Range: 236 - 444 ug/dL 359  %SAT Latest Ref Range: 21 - 57 % 7 (L)  Ferritin Latest Ref Range: 9 - 269 ng/ml 29   Pathology report  Diagnosis 02/07/2016 1. Colon, segmental resection for tumor, right - INVASIVE MODERATELY DIFFERENTIATED ADENOCARCINOMA, SPANNING 5 CM IN GREATEST DIMENSION. - TUMOR ARISES FROM THE CECUM AND INVADES THROUGH THE FULL THICKNESS OF THE BOWEL AND PERFORATES THE SEROSA. - PROXIMAL AND DISTAL MARGINS ARE NEGATIVE (PLEASE SEE SPECIMEN #2 FOR FINAL MARGIN STATUS). - TWO OF FORTY-THREE LYMPH NODES POSITIVE FOR METASTATIC COLORECTAL ADENOCARCINOMA (2/43). - SEE ONCOLOGY TEMPLATE. 2. Soft tissue mass, simple excision, pelvic side wall - POSITIVE FOR COLORECTAL ADENOCARCINOMA. - MARGIN IS BROADLY POSITIVE. 3. Adnexa - ovary +/- tube, neoplastic, right - OVARIAN FIBROMA WITH ASSOCIATED CALCIFICATIONS. - BENIGN FALLOPIAN TUBE - NO ATYPIA OR MALIGNANCY IDENTIFIED. - SEE COMMENT. Microscopic Comment 1. COLON AND RECTUM (INCLUDING TRANS-ANAL RESECTION): Specimen: Right partial colon with additional right pelvic sidewall tissue. Procedure: Right hemicolectomy with lateral pelvic sidewall excision. Tumor site: Cecum. Specimen integrity: Intact. Macroscopic intactness of mesorectum: Not applicable. Macroscopic tumor perforation: Yes, adenocarcinoma macroscopically perforates serosa. Invasive tumor: Maximum size: 5 cm. Histologic type(s): Adenocarcinoma. Histologic grade and differentiation G2: moderately differentiated/low grade. Type of polyp in which invasive carcinoma arose: A definitive precursor polyp  is not identified. Microscopic extension of invasive tumor: Tumor invades through the entire colon and perforates the serosa. Lymph-Vascular invasion: Definitive lymph/vascular invasion is not identified, however there is lymph node positivity, see below Peri-neural invasion: Not identified. Tumor deposit(s) (discontinuous extramural extension): No tumor deposits are identified. Resection margins: Proximal margin: Negative. Distal margin: Negative. Circumferential (radial) (posterior ascending, posterior descending; lateral and posterior mid-rectum; and entire lower 1/3 rectum): Tumor involves pelvic sidewall margin (positive margin), see below comment. Mesenteric margin (sigmoid and transverse): Not applicable. Distance closest margin (if all above margins negative): Tumor invades into pelvic sidewall with positive pelvic sidewall margin, see below comment. Trans-anal resection margins only: Deep margin: Not applicable. Mucosal Margin: Not applicable. Distance closest mucosal margin (if negative): Not applicable. Treatment effect (neo-adjuvant therapy): Not applicable. Additional polyp(s): The possible mucosal polyp seen grossly simply demonstrates benign polypoid colorectal mucosa without a true polyp identified. Non-neoplastic findings: No additional significant non-neoplastic findings. Lymph nodes: number examined 43; number positive: 2. Pathologic Staging: pT4b, pN1b, R2, see below comment. Ancillary studies: The tumor will be sent for MSI by PCR and MMR by [facility] per colorectal cancer protocol. Comments: As the tumor is perforated with invasion into the pelvic sidewall, this is assessed as representing direct extension into another structure and is therefore compatible with pT4b tumor. The margin as indicated by the surgeon of the pelvic sidewall is grossly positive on pathologic gross examination and shows broad positivity on microscopic examination. The findings are consistent with  the above assessment. The case was discussed with Dr. [name] on 02/09/2016. 3. Of note, the right fallopian tube in the third specimen demonstrates findings suggestive of previous tubal ligation. Please correlate with surgical history. (RH:kh 02-09-16) Willeen Niece MD Pathologist, Electronic Signature  1. Mismatch Repair (MMR) Protein Immunohistochemistry (IHC) IHC Expression Result: MLH1: Preserved nuclear expression (greater 50% tumor expression) MSH2: Preserved nuclear expression (greater 50% tumor expression) MSH6: Preserved nuclear expression (greater 50% tumor expression) PMS2: Preserved nuclear expression (greater 50% tumor expression) * Internal control demonstrates intact nuclear expression     RADIOGRAPHIC STUDIES: I have personally reviewed the radiological images as listed and agreed with the findings in the report. No results found.  ASSESSMENT & PLAN:  65 year old female, with past medical history of hypertension and abdominal aorta aneurysm, presented with perforation right cecum colon cancer.  1. Cancer of ascending colon, pT4bN1bM0, stage IIIC, MSI-stable, (+) surgical margins at pelvic wall     -I previously discussed her surgical pathology report and prior CT scan findings in great details with patient and her daughter.  -I reviewed her staging CT chest, which was negative for metastasis. --Due to the locally advanced disease, perforated tumor, and positive surgical margins, she is at extremely high risk for cancer recurrence. -Her case was discussed in our tumor board, we recommend adjuvant chemotherapy and irradiation -She has completed new adjuvant chemotherapy and irradiation, throat the dose was reduced due to her neutropenia. -She stopped the Xeloda for the last week of radiation, due to difficulty swallowing the pills  -I strongly recommend her to consider adjuvant chemotherapy, due to her extremely high risk of recurrence. After lengthy discussion, patient  declined chemotherapy, due to the concern of side effects and impact on her quality of life. -Pt voiced good understanding of the benefit of adjuvant chemotherapy, which will reduce but not limited her risk of recurrence. She states she will not regret about her decision of not pursuing adjuvant chemotherapy -She is agreeable with cancer surveillance. I'll plan to see her back every 2 months with lab, including CBC, CMP and CEA. I plan to repeat her staging scan in 4-6 months.  2. HTN -She'll follow-up with her primary care physician and continue medication -we discussed that chemotherapy may affect her blood pressure, we'll monitor her pressures closely -I have held her Maxide due to her dizziness  3. Anemia -Her anemia is related to her surgery and iron deficiency. Iron studies showed evidence of iron deficiency. -She previously took iron pill before, and had some constipation. She is agreeable to restart ferrous sulfate 1-2 tablets a day.  -We'll consider IV Feraheme if she does not respond well to oral iron supplement  Plan -pt declined adjuvant chemotherapy -Lab and follow-up in 2 months   All questions were answered. The patient knows to call the clinic with any problems, questions or concerns.  I spent 35 minutes counseling the patient face to face. The total time spent in the appointment was 40 minutes and more than 50% was on counseling.     [name], MD 05/20/2016

## 2016-05-21 ENCOUNTER — Encounter: Payer: Self-pay | Admitting: Hematology

## 2016-05-27 NOTE — Progress Notes (Addendum)
Mrs. Greysi Pangelinan is here for an one month  follow up appointment for stage IIIC adenocarcinoma of the ascending colon plus surgical margins at pelvic wall.  Weight changes, if any: Wt Readings from Last 3 Encounters:  05/28/16 114 lb 3.2 oz (51.8 kg)  05/20/16 118 lb 11.2 oz (53.8 kg)  04/24/16 118 lb 6.4 oz (53.7 kg)   Bowel/Bladder complaints, if [id number] normal bowel movements and having normal bladder pattern. Nausea/Vomiting, if [id number] Pain issues, if [id number] Has more energy since completing her radiation treatment. BP 132/86   Pulse 82   Temp 98.4 F (36.9 C) (Oral)   Resp 18   Ht 5\' 5"  (1.651 m)   Wt 114 lb 3.2 oz (51.8 kg)   SpO2 100%   BMI 19.00 kg/m

## 2016-05-28 ENCOUNTER — Ambulatory Visit
Admission: RE | Admit: 2016-05-28 | Discharge: 2016-05-28 | Disposition: A | Discharge status: Home / Self Care | Payer: Medicare Other | Source: Ambulatory Visit | Attending: Radiation Oncology | Admitting: Radiation Oncology

## 2016-05-28 ENCOUNTER — Other Ambulatory Visit (HOSPITAL_BASED_OUTPATIENT_CLINIC_OR_DEPARTMENT_OTHER): Payer: Federal, State, Local not specified - PPO

## 2016-05-28 ENCOUNTER — Encounter: Payer: Self-pay | Admitting: Radiation Oncology

## 2016-05-28 VITALS — BP 132/86 | HR 82 | Temp 98.4°F | Resp 18 | Ht 65.0 in | Wt 114.2 lb

## 2016-05-28 DIAGNOSIS — C182 Malignant neoplasm of ascending colon: Secondary | ICD-10-CM | POA: Insufficient documentation

## 2016-05-28 DIAGNOSIS — D500 Iron deficiency anemia secondary to blood loss (chronic): Secondary | ICD-10-CM

## 2016-05-28 LAB — CBC & DIFF AND RETIC
BASO%: 0.5 % (ref 0.0–2.0)
BASOS ABS: 0 10*3/uL (ref 0.0–0.1)
EOS ABS: 0.2 10*3/uL (ref 0.0–0.5)
EOS%: 7.2 % — ABNORMAL HIGH (ref 0.0–7.0)
HEMATOCRIT: 31.3 % — AB (ref 34.8–46.6)
HEMOGLOBIN: 9.9 g/dL — AB (ref 11.6–15.9)
IMMATURE RETIC FRACT: 4.6 % (ref 1.60–10.00)
LYMPH%: 48.8 % (ref 14.0–49.7)
MCH: 25.8 pg (ref 25.1–34.0)
MCHC: 31.6 g/dL (ref 31.5–36.0)
MCV: 81.7 fL (ref 79.5–101.0)
MONO#: 0.3 10*3/uL (ref 0.1–0.9)
MONO%: 15.8 % — AB (ref 0.0–14.0)
NEUT%: 27.7 % — ABNORMAL LOW (ref 38.4–76.8)
NEUTROS ABS: 0.6 10*3/uL — AB (ref 1.5–6.5)
Platelets: 175 10*3/uL (ref 145–400)
RBC: 3.83 10*6/uL (ref 3.70–5.45)
RDW: 20.3 % — ABNORMAL HIGH (ref 11.2–14.5)
RETIC %: 0.71 % (ref 0.70–2.10)
Retic Ct Abs: 27.19 10*3/uL — ABNORMAL LOW (ref 33.70–90.70)
WBC: 2.1 10*3/uL — ABNORMAL LOW (ref 3.9–10.3)
lymph#: 1 10*3/uL (ref 0.9–3.3)

## 2016-05-28 LAB — COMPREHENSIVE METABOLIC PANEL
ALBUMIN: 3.5 g/dL (ref 3.5–5.0)
ALK PHOS: 64 U/L (ref 40–150)
ALT: 7 U/L (ref 0–55)
AST: 17 U/L (ref 5–34)
Anion Gap: 7 mEq/L (ref 3–11)
BILIRUBIN TOTAL: 0.27 mg/dL (ref 0.20–1.20)
BUN: 12.4 mg/dL (ref 7.0–26.0)
CALCIUM: 9.6 mg/dL (ref 8.4–10.4)
CO2: 26 mEq/L (ref 22–29)
CREATININE: 0.8 mg/dL (ref 0.6–1.1)
Chloride: 107 mEq/L (ref 98–109)
EGFR: 84 mL/min/{1.73_m2} — ABNORMAL LOW (ref >90)
GLUCOSE: 85 mg/dL (ref 70–140)
POTASSIUM: 4.9 meq/L (ref 3.5–5.1)
SODIUM: 141 meq/L (ref 136–145)
TOTAL PROTEIN: 7.2 g/dL (ref 6.4–8.3)

## 2016-05-28 NOTE — Progress Notes (Signed)
Radiation Oncology         [phone] ________________________________  Name: Brittney Spencer MRN: [id number]  Date: 05/28/2016  DOB: August 14, 1950  Post Treatment Note  CC: Pcp Not In System  [name], MD  Diagnosis:   Stage IIIC [phone]) adenocarcinoma of the ascending colon MSI-stable, (+) surgical margins at pelvic wall.  Interval Since Last Radiation:  4 weeks   03/19/16 - 04/25/16 1. Right upper pelvis: 45 Gy in 25 fractions. 2. Rectal boost: 5.4 Gy in 3 fractions.  Narrative:  The patient returns today for routine follow-up. She tolerated radiotherapy well. She has met with Dr. Burr Medico and is not interested in additional adjuvant therapy at this time.                        On review of systems, the patient states she is not experiencing difficulty with chest pain shortness of breath, fevers or chills. She is not experiencing any abdominal pain, nausea or vomiting. No other complaints or verbalized.   ALLERGIES:  is allergic to fish allergy; peanut-containing drug products; soy allergy; and buspirone.  Meds: Current Outpatient Prescriptions  Medication Sig Dispense Refill  . NORVASC 2.5 MG tablet Take 1 tablet (2.5 mg total) by mouth daily. 90 tablet 1  . polyethylene glycol (MIRALAX / GLYCOLAX) packet 1 dose daily, you can buy this as any pharmacy without a prescription. 14 each 0  . Probiotic Product ([employer] PROBIOTIC PO) Take 1 capsule by mouth as directed. Takes  3 - 4 times per week per pt.    Marland Kitchen acetaminophen (TYLENOL) 325 MG tablet Take 2 tablets (650 mg total) by mouth every 6 (six) hours as needed for mild pain, moderate pain, fever or headache. (Patient not taking: Reported on 05/28/2016)    . ALPRAZolam (XANAX) 0.25 MG tablet Take 0.25 mg by mouth daily as needed for anxiety.   3  . HYDROcodone-acetaminophen (NORCO/VICODIN) 5-325 MG tablet Take 1-2 tablets by mouth every 4 (four) hours as needed for moderate pain or severe pain. (Patient not taking: Reported  on 05/28/2016) 30 tablet 0  . ondansetron (ZOFRAN) 4 MG tablet Take 1 tablet (4 mg total) by mouth every 6 (six) hours as needed for nausea. (Patient not taking: Reported on 05/28/2016) 10 tablet 0  . oxyCODONE-acetaminophen (PERCOCET/ROXICET) 5-325 MG tablet Take 1-2 tablets by mouth every 4 (four) hours as needed for moderate pain. (Patient not taking: Reported on 05/28/2016) 40 tablet 0   No current facility-administered medications for this encounter.     Physical Findings:  height is _0  (1.651 m) and weight is 114 lb 3.2 oz (51.8 kg). Her oral temperature is 98.4 F (36.9 C). Her blood pressure is 132/86 and her pulse is 82. Her respiration is 18 and oxygen saturation is 100%.  In general this is a well appearing African-American female in no acute distress. She's alert and oriented x4 and appropriate throughout the examination. Cardiopulmonary assessment is negative for acute distress and she exhibits normal effort.   Lab Findings: Lab Results  Component Value Date   WBC 2.1 (L) 05/28/2016   HGB 9.9 (L) 05/28/2016   HCT 31.3 (L) 05/28/2016   MCV 81.7 05/28/2016   PLT 175 05/28/2016     Radiographic Findings: No results found.  Impression/Plan: 1. Stage IIIC ([id number]) adenocarcinoma of the ascending colon MSI-stable, (+) surgical margins at pelvic wall.The patient appears to be doing well since completion of her radiotherapy. She will move forward with  continued surveillance with Dr. Burr Medico, and I've let her know that we would be happy to see her back in the future should she have any need for radiotherapy however at this point in time we'll be available on an as-needed basis.    Carola Rhine, PAC

## 2016-05-29 LAB — IRON AND TIBC
%SAT: 9 % — ABNORMAL LOW (ref 21–57)
Iron: 37 ug/dL — ABNORMAL LOW (ref 41–142)
TIBC: 395 ug/dL (ref 236–444)
UIBC: 358 ug/dL (ref 120–384)

## 2016-05-29 LAB — FERRITIN: FERRITIN: 11 ng/mL (ref 9–269)

## 2016-07-01 HISTORY — PX: COLONOSCOPY: SHX174

## 2016-07-19 IMAGING — CT CT ABD-PELV W/ CM
3 of 5 series · 11 of 36 positions shown, 17 images · IV contrast (READICAT/WATER & [ID] ISOVUE 300)
Comparison: September 15, 2015

ADDENDUM:
This case was reviewed as part of random peer review on 10/24/2015.
While I agree that the inflammatory changes inferior to the cecal
tip appear improved, there is still irregular soft tissue thickening
of the cecal tip, and there are adjacent prominent lymph nodes in
the ileocolonic mesentery, measuring 13 mm on image 49 and 8 mm on
image 52. In addition, there is a 2.5 x 1.8 cm nodule on image 46
which has central low density. Therefore, these findings are
moderately suspicious for an underlying cecal malignancy with
perforation. Further evaluation with colonoscopy recommended. These
results were called by telephone at the time of addendum on
10/25/2015 at [DATE] to Dr. OLLE MITTJAS , who verbally
acknowledged these results.
CLINICAL DATA: Right lower quadrant inflammatory lesion with pain.
Completed course of antibiotics

EXAM:
CT ABDOMEN AND PELVIS WITH CONTRAST
TECHNIQUE: Multidetector CT imaging of the abdomen and pelvis was performed
using the standard protocol following bolus administration of
intravenous contrast. Oral contrast was also administered.
CONTRAST:  100mL DAGVHN-JEE IOPAMIDOL (DAGVHN-JEE) INJECTION 61%

[Series 3: abd/pelvis with · axial · 0.70mm/px · z∈[-302,-12]mm · 7 of 78 slices shown, 12 images]
[im 10/78  soft-tissue]
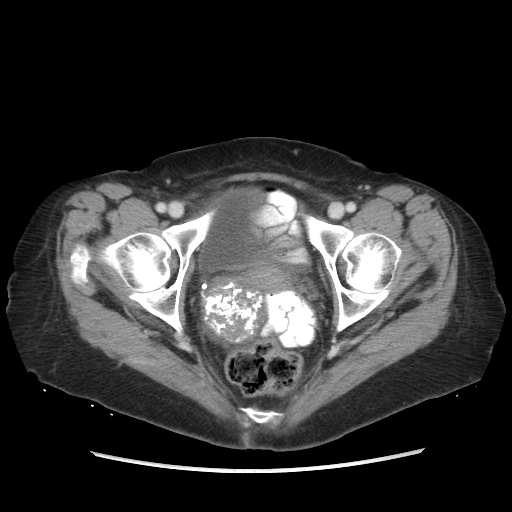
[im 10/78  bone]
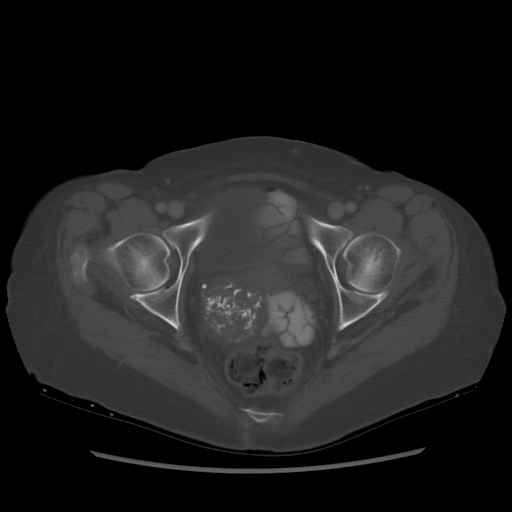
[im 20/78  soft-tissue]
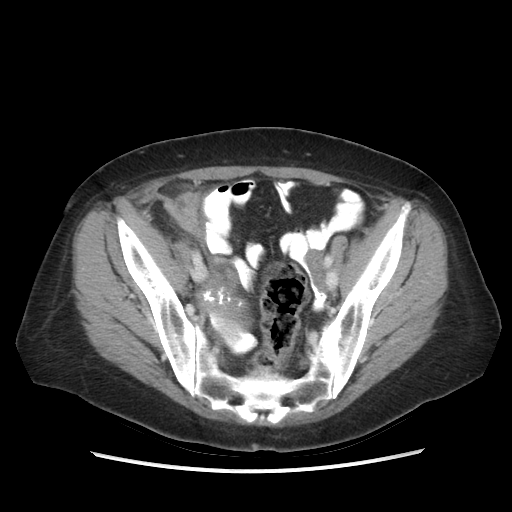
[im 29/78  soft-tissue]
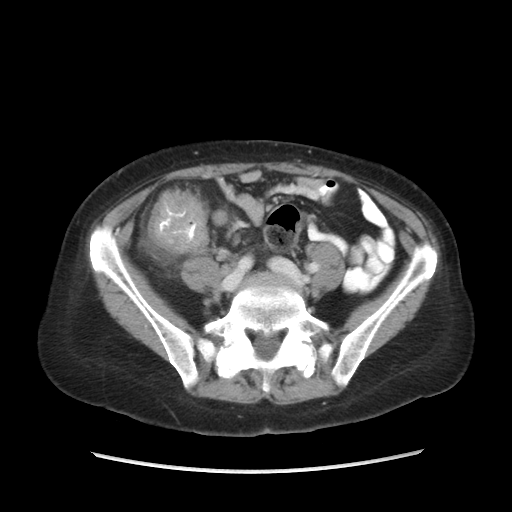
[im 39/78  soft-tissue]
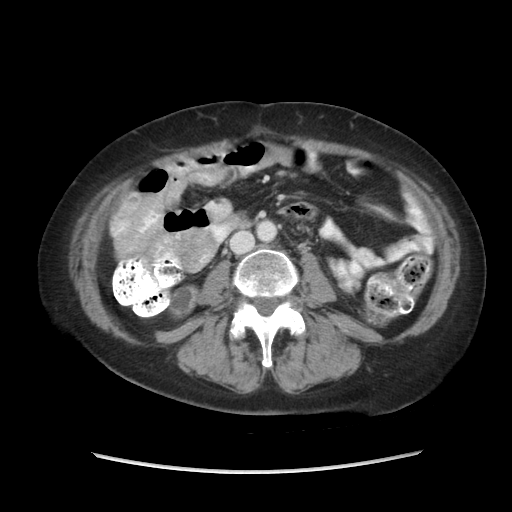
[im 39/78  lung]
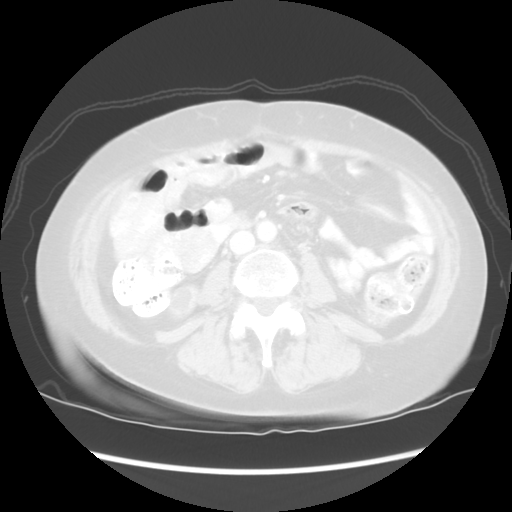
[im 49/78  soft-tissue]
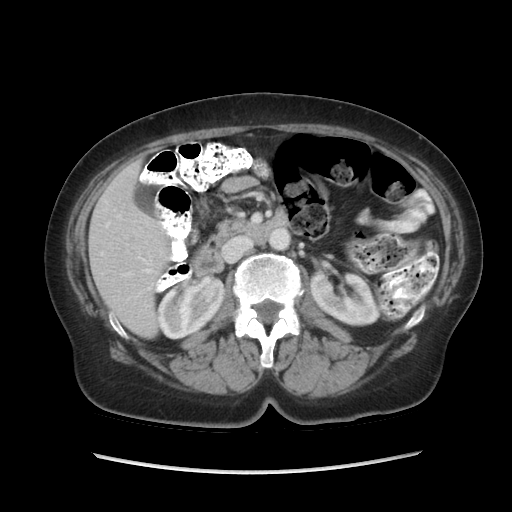
[im 49/78  lung]
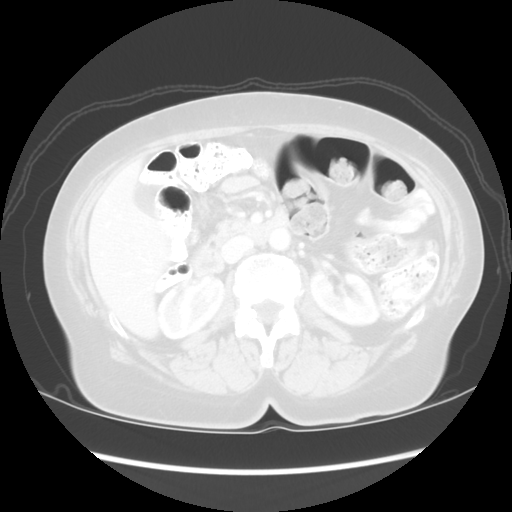
[im 58/78  soft-tissue]
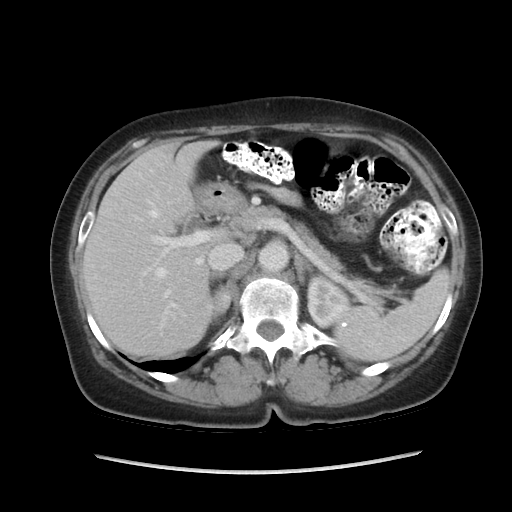
[im 58/78  lung]
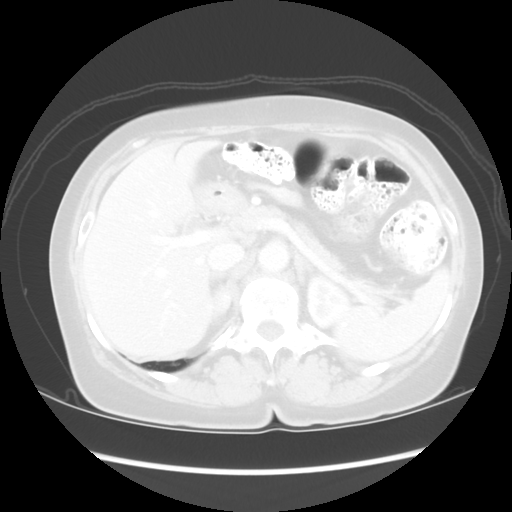
[im 68/78  soft-tissue]
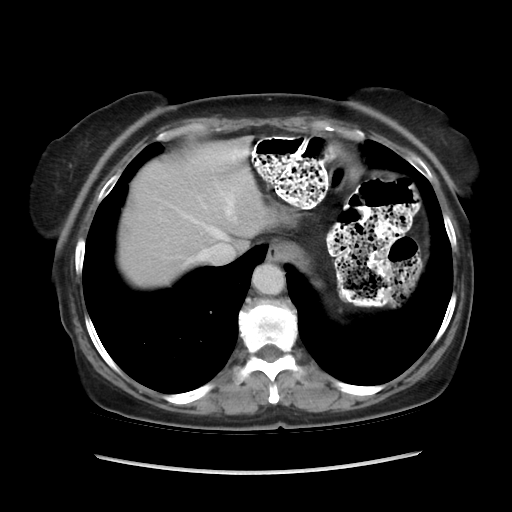
[im 68/78  lung]
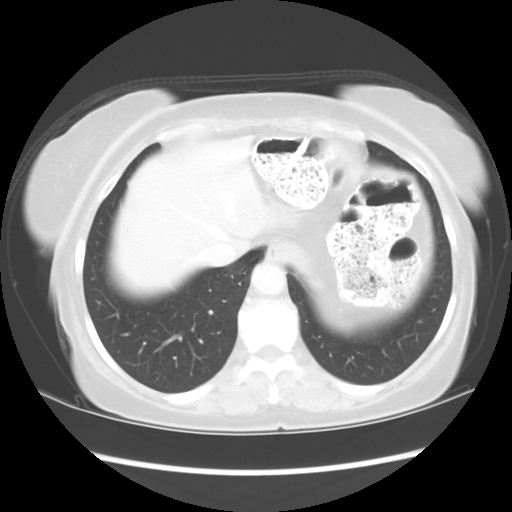

[Series 601: coronal body · coronal · 0.90mm/px · 1 of 108 slices shown, 2 images]
[im 36/108  soft-tissue]
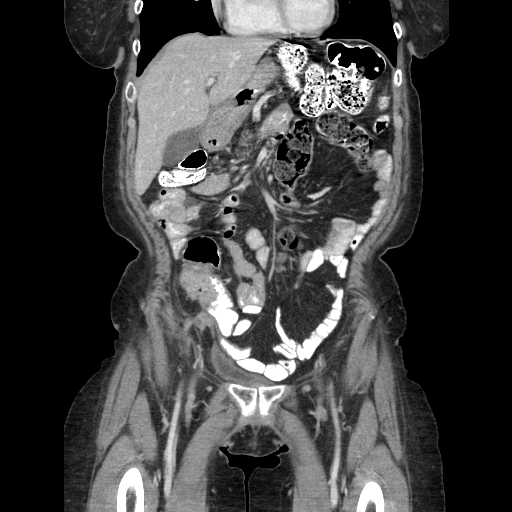
[im 36/108  bone]
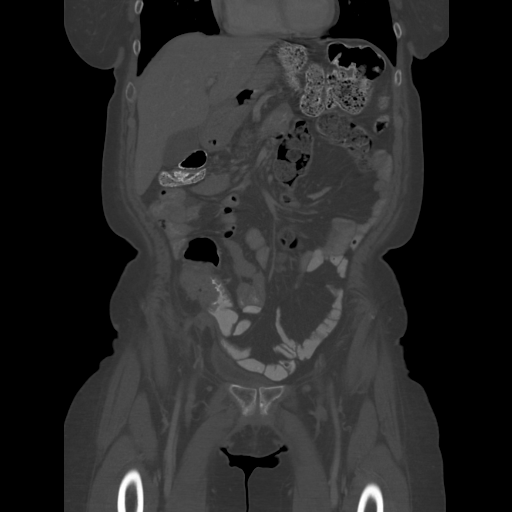

[Series 602: sagittal body · sagittal · 0.90mm/px · 3 of 144 slices shown]
[im 9/144  soft-tissue]
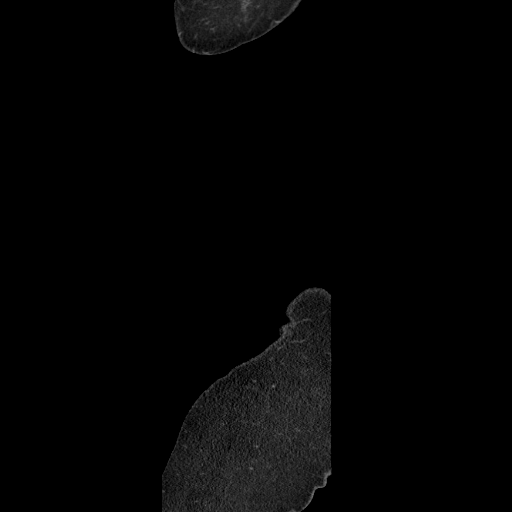
[im 27/144  soft-tissue]
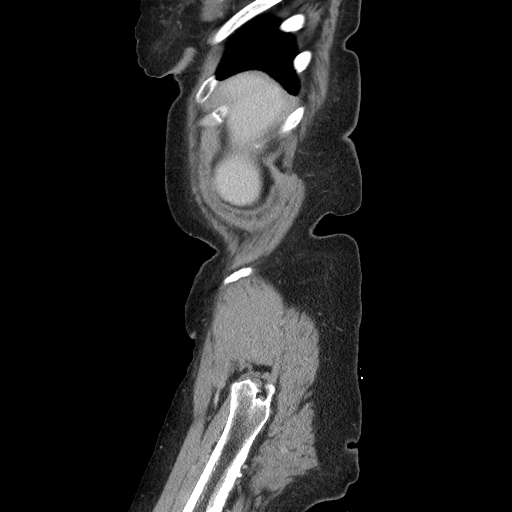
[im 45/144  soft-tissue]
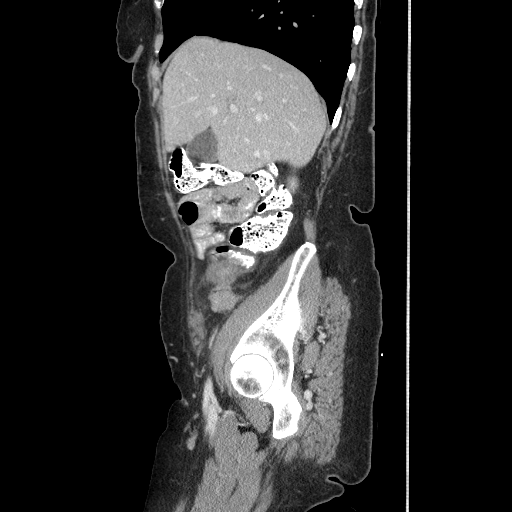

[11 of 36 positions shown; findings below may reference images not displayed]

FINDINGS: Lower chest:  Lung bases are clear.

Hepatobiliary: No focal liver lesions are identified. There is no
gallbladder wall thickening. No biliary duct dilatation.

Pancreas: No pancreatic mass or inflammatory focus.

Spleen: No splenic lesions are evident beyond scattered calcified
splenic granulomas, stable.

Adrenals/Urinary Tract: Adrenals appear normal bilaterally. Kidneys
bilaterally show no hydronephrosis on either side. Right kidney is
mildly malrotated, stable. There is a cyst arising from the lower
pole right kidney measuring 1.4 x 1.4 cm. There is no renal or
ureteral calculus on either side. Urinary bladder is midline with
wall thickness within normal limits.

Stomach/Bowel: There is wall thickening in the cecum and terminal
ileum, consistent with the nearby inflammatory focus. There is no
bowel obstruction. No other bowel wall thickening is noted. No free
air or portal venous air is evident. There is moderate stool
throughout the colon.

Vascular/Lymphatic: There is no abdominal aortic aneurysm. There is
no appreciable vascular lesion. No adenopathy is evident in the
abdomen or pelvis.

Reproductive: There is again noted a posterior exophytic calcified
leiomyoma on the right, measuring 6.8 x 6.6 x 5.0 cm. No new pelvic
masses evident. No ovarian lesions are identified.

Other: The inflammatory mass abutting the cecum in the right lower
quadrant has become smaller compared to 1 month prior. This lesion
currently measures 4.3 x 3.9 x 2.7 cm compared to 5.2 x 4.5 x 5.2 cm
1 month prior. This inflammatory focus shows less fluid and no air
within its borders currently. There is less surrounding mesenteric
inflammation compared to the previous study. Normal appendix not
seen. Elsewhere, no abscess identified. No ascites present.

Musculoskeletal: There are no blastic or lytic bone lesions. No
intramuscular or abdominal wall lesion.
IMPRESSION: The inflammatory focus in the right lower quadrant abutting the
terminal ileum and cecum is again present although smaller and less
complex in nature compared to 1 month prior. This lesion does not
contain appreciable fluid or air in appears fairly homogeneous and
solid in appearance. As noted before, residua of appendiceal rupture
is most likely etiology. Normal appendix not seen. Note that there
is thickening of the terminal ileum and proximal cecum, likely due
to inflammation from this inflammatory lesion. Indeed there is
slightly more thickening of the proximal cecum compared to 1 month
prior. No other bowel wall thickening is seen.

There is no bowel obstruction.  No free air.

Complex exophytic right-sided uterine leiomyoma containing extensive
calcification, stable.

Calcified splenic granulomas present.

No renal or ureteral calculus.  No hydronephrosis.

## 2016-07-19 NOTE — Progress Notes (Signed)
[facility]  [phone] [phone]  Clinic Follow Up Note   Patient Care Team: Pcp Not In System as PCP - General 07/22/2016  CHIEF COMPLAINTS:  Follow up stage IIIC right colon cancer   Oncology History   Cancer of ascending colon [facility])   Staging form: Colon and Rectum, AJCC 7th Edition   - Clinical stage from 02/07/2016: Stage IIIC (T4b, N1b, M0) - Signed by [name], MD on 03/04/2016      Cancer of ascending colon ([place])   10/25/2015 Imaging    CT ABD/PELVIS:  Inflammatory changes inferior to the cecal tip appear improved, there is still irregular soft tissue thickening of the cecal tip, and there are adjacent prominent lymph nodes in the ileocolonic mesentery, measuring 13 mm on image 49 and 8 mm on image 52. In addition, there is a 2.5 x 1.8 cm nodule on image 46 which has central low density. Therefore, these findings are moderately suspicious for an underlying cecal malignancy with perforation.       01/19/2016 Procedure    COLONOSCOPY per Dr. [name]: Fungating, ulcerated mass almost obstructing mid ascending colon      02/05/2016 Tumor Marker    Patient's tumor was tested for the following markers: CEA Results of the tumor marker test revealed 5.7.      02/07/2016 Initial Diagnosis    Cancer of ascending colon ([place])     02/07/2016 Definitive Surgery    Laparoscopic assisted right hemicolectomy and right salpingo oopherectomy--Dr. [name]      02/07/2016 Pathologic Stage    p T4 N1b   2/43 nodes +      02/07/2016 Pathology Results    MMR normal; G2 adenocarcinoma;proximal & distal margins negative; soft tissue mass on pelvic sidewall + for adenocarcinoma with positive margin MSI Stable      03/08/2016 Imaging    CT chest negative for metastasis.       03/19/2016 - 04/25/2016 Radiation Therapy    Adjuvant irradiation, 50 gray in 28 fractions      03/19/2016 - 04/22/2016 Chemotherapy    Xeloda 1500 mg twice daily, started on  03/19/2016, dose reduced to 1000 mg twice daily from week 3 due to neutropenia, and patient stopped 3 days before last dose radiation due to difficulty swallowing the pill       05/20/2016 -  Adjuvant Chemotherapy    Patient declined adjuvant chemotherapy       HISTORY OF PRESENTING ILLNESS:  Brittney Spencer 66 y.o. female is here because of her newly diagnsosed ascending colon cancer. She is accompanied by her daughter to my clinic today.   She initially presented with vaginal discharge and gastric discomfot., and was seen by her GYN and underwent CT scan which showedA perforated appendicitis. She was treated nonoperatively with IV antibiotics with resolution of her symptoms. Posttreatment CT scan questioned  Some thickening of the cecum, question neoplasm. Colonoscopy was recommended and was delayed somewhat due to patient issues and revealed an obvious malignancy in the cecum with partial obstruction. Biopsy revealed adenocarcinoma. She was referred to Dr. [name] and underwent laparoscopic-assisted and possible open right hemicolectomy on 02/07/2016. Per the OR note, at laparoscopy there was a bulky somewhat fixed tumor in the cecum. There was found to be a perforated cancer with a chronic cavity invading into the posterior lateral retroperitoneum.  The cancer was very bulky and locally invasive but was grossly completely removed  With ileotransverse colostomy. Final pathology revealed a 5 cm tumor  With microscopically broadly positive lateral margin in the retroperitoneum (grossly negative by inspection of the time of surgery) and 2 out of 42 lymph nodes positive.Dr. [name] marked the retroperitoneum with clips for possible radiation therapy. Postoperative course was unremarkable.   She has been recovering well from surgery. She has minimum pain at the incision site, staples removed, no infection or other complications. Her appetite and energy level have much improved, although not back to  normal yet. She lost about 10 lbs in the past 5 month, but gained a few lbs lately.   CURRENT THERAPY: Surveillance  INTERIM HISTORY: [name] returns for follow up. She has recovered very well from her concurrent chemotherapy and radiation, but has declined to pursue adjuvant chemotherapy. The patient reports a good good appetite. She has gained 9 lbs since November 2017. She denies abdominal pain or bloating. She reports that she is still weak and had difficulty carrying a case of bottled water. However, she reports her energy has improved and she tries to remain active. She feels exhausted whenever she has a full schedule. She occasionally take an iron pill.  MEDICAL HISTORY:  Past Medical History:  Diagnosis Date  . AAA (abdominal aortic aneurysm) ([facility])    infrarenal 4.1 cmper s-9-19 scan on chart  . Anemia   . Anxiety   . Colon cancer ([place])   . Hypertension   . Vitamin D deficiency     SURGICAL HISTORY: Past Surgical History:  Procedure Laterality Date  . COLONSCOPY  12/2015  . LAPAROSCOPIC RIGHT HEMI COLECTOMY Right 02/07/2016   Procedure: LAPAROSCOPIC ASSISTED RIGHT HEMI COLECTOMY AND RIGHT SALPINGO OOPHERECTOMY;  Surgeon: [name], MD;  Location: [facility];  Service: General;  Laterality: Right;    SOCIAL HISTORY: Social History   Social History  . Marital status: Widowed    Spouse name: N/A  . Number of children: N/A  . Years of education: N/A   Occupational History  . Not on file.   Social History Main Topics  . Smoking status: Never Smoker  . Smokeless tobacco: Never Used  . Alcohol use No  . Drug use: No  . Sexual activity: Yes    Birth control/ protection: None   Other Topics Concern  . Not on file   Social History Narrative  . No narrative on file   She is retired  She has one daughter   FAMILY HISTORY: Family History  Problem Relation Age of Onset  . Cancer Mother     lung cancer  . Cancer Maternal Grandfather     unknown cancer   .  Colon cancer Neg Hx     ALLERGIES:  is allergic to fish allergy; peanut-containing drug products; soy allergy; and buspirone.  MEDICATIONS:  Current Outpatient Prescriptions  Medication Sig Dispense Refill  . NORVASC 2.5 MG tablet Take 1 tablet (2.5 mg total) by mouth daily. 90 tablet 1  . polyethylene glycol (MIRALAX / GLYCOLAX) packet 1 dose daily, you can buy this as any pharmacy without a prescription. 14 each 0  . Probiotic Product ([employer] PROBIOTIC PO) Take 1 capsule by mouth as directed. Takes  3 - 4 times per week per pt.    Marland Kitchen acetaminophen (TYLENOL) 325 MG tablet Take 2 tablets (650 mg total) by mouth every 6 (six) hours as needed for mild pain, moderate pain, fever or headache. (Patient not taking: Reported on 05/28/2016)    . ALPRAZolam (XANAX) 0.25 MG tablet Take 0.25 mg by mouth daily as needed for  anxiety.   3  . HYDROcodone-acetaminophen (NORCO/VICODIN) 5-325 MG tablet Take 1-2 tablets by mouth every 4 (four) hours as needed for moderate pain or severe pain. (Patient not taking: Reported on 05/28/2016) 30 tablet 0  . ondansetron (ZOFRAN) 4 MG tablet Take 1 tablet (4 mg total) by mouth every 6 (six) hours as needed for nausea. (Patient not taking: Reported on 05/28/2016) 10 tablet 0  . oxyCODONE-acetaminophen (PERCOCET/ROXICET) 5-325 MG tablet Take 1-2 tablets by mouth every 4 (four) hours as needed for moderate pain. (Patient not taking: Reported on 05/28/2016) 40 tablet 0   No current facility-administered medications for this visit.     REVIEW OF SYSTEMS:   Constitutional: Denies fevers, chills or abnormal night sweats (+) Fatigue Eyes: Denies blurriness of vision, double vision or watery eyes Ears, nose, mouth, throat, and face: Denies mucositis or sore throat Respiratory: Denies cough, dyspnea or wheezes Cardiovascular: Denies palpitation, chest discomfort or lower extremity swelling Gastrointestinal:  Denies nausea, heartburn or change in bowel habits Skin: Denies abnormal  skin rashes Lymphatics: Denies new lymphadenopathy or easy bruising Neurological:Denies numbness, tingling or new weaknesses Behavioral/Psych: Mood is stable, no new changes  All other systems were reviewed with the patient and are negative.  PHYSICAL EXAMINATION: ECOG PERFORMANCE STATUS: 1  Vitals:   07/22/16 1314  BP: 130/89  Pulse: 79  Resp: 18  Temp: 98.5 F (36.9 C)   Filed Weights   07/22/16 1314  Weight: 123 lb 8 oz (56 kg)    GENERAL:alert, no distress and comfortable SKIN: skin color, texture, turgor are normal, no rashes or significant lesions EYES: normal, conjunctiva are pink and non-injected, sclera clear OROPHARYNX:no exudate, no erythema and lips, buccal mucosa, and tongue normal  NECK: supple, thyroid normal size, non-tender, without nodularity LYMPH:  no palpable lymphadenopathy in the cervical, axillary or inguinal LUNGS: clear to auscultation and percussion with normal breathing effort HEART: regular rate & rhythm and no murmurs and no lower extremity edema ABDOMEN:abdomen soft, non-tender and normal bowel sounds, surgical wound has healed well. Musculoskeletal:no cyanosis of digits and no clubbing  PSYCH: alert & oriented x 3 with fluent speech NEURO: no focal motor/sensory deficits  LABORATORY DATA:   I have reviewed the data as listed CBC Latest Ref Rng & Units 07/22/2016 05/28/2016 04/23/2016  WBC 3.9 - 10.3 10e3/uL 2.5(L) 2.1(L) 2.6(L)  Hemoglobin 11.6 - 15.9 g/dL 9.9(L) 9.9(L) 9.4(L)  Hematocrit 34.8 - 46.6 % 31.1(L) 31.3(L) 29.2(L)  Platelets 145 - 400 10e3/uL 163 175 210   CMP Latest Ref Rng & Units 07/22/2016 05/28/2016 04/23/2016  Glucose 70 - 140 mg/dl 107 85 103  BUN 7.0 - 26.0 mg/dL 15.1 12.4 11.3  Creatinine 0.6 - 1.1 mg/dL 0.8 0.8 0.8  Sodium 136 - 145 mEq/L 139 141 140  Potassium 3.5 - 5.1 mEq/L 3.9 4.9 4.5  Chloride 101 - 111 mmol/L - - -  CO2 22 - 29 mEq/L _0 Calcium 8.4 - 10.4 mg/dL 9.3 9.6 8.8  Total Protein 6.4 - 8.3  g/dL 7.3 7.2 5.8(L)  Total Bilirubin 0.20 - 1.20 mg/dL 0.46 0.27 <0.22  Alkaline Phos 40 - 150 U/L 68 64 53  AST 5 - 34 U/L _1 ALT 0 - 55 U/L _2 Results for BRUNETTE, LAVALLE (MRN [id number]) as of 07/22/2016 13:30  Ref. Range 05/28/2016 14:44  Iron Latest Ref Range: 41 - 142 ug/dL 37 (L)  UIBC Latest Ref Range: 120 - 384 ug/dL 358  TIBC Latest Ref Range: 236 - 444 ug/dL 395  %SAT Latest Ref Range: 21 - 57 % 9 (L)  Ferritin Latest Ref Range: 9 - 269 ng/ml 11   Results for MAYANNA, GARLITZ (MRN [id number]) as of 07/22/2016 13:30  Ref. Range 02/05/2016 09:40 02/27/2016 16:15 04/23/2016 13:27  CEA Latest Ref Range: 0.0 - 4.7 ng/mL 5.7 (H)    CEA ([facility]) Latest Ref Range: 0.00 - 5.00 ng/mL  1.23 1.40    Pathology report  Diagnosis 02/07/2016 1. Colon, segmental resection for tumor, right - INVASIVE MODERATELY DIFFERENTIATED ADENOCARCINOMA, SPANNING 5 CM IN GREATEST DIMENSION. - TUMOR ARISES FROM THE CECUM AND INVADES THROUGH THE FULL THICKNESS OF THE BOWEL AND PERFORATES THE SEROSA. - PROXIMAL AND DISTAL MARGINS ARE NEGATIVE (PLEASE SEE SPECIMEN #2 FOR FINAL MARGIN STATUS). - TWO OF FORTY-THREE LYMPH NODES POSITIVE FOR METASTATIC COLORECTAL ADENOCARCINOMA (2/43). - SEE ONCOLOGY TEMPLATE. 2. Soft tissue mass, simple excision, pelvic side wall - POSITIVE FOR COLORECTAL ADENOCARCINOMA. - MARGIN IS BROADLY POSITIVE. 3. Adnexa - ovary +/- tube, neoplastic, right - OVARIAN FIBROMA WITH ASSOCIATED CALCIFICATIONS. - BENIGN FALLOPIAN TUBE - NO ATYPIA OR MALIGNANCY IDENTIFIED. - SEE COMMENT. Microscopic Comment 1. COLON AND RECTUM (INCLUDING TRANS-ANAL RESECTION): Specimen: Right partial colon with additional right pelvic sidewall tissue. Procedure: Right hemicolectomy with lateral pelvic sidewall excision. Tumor site: Cecum. Specimen integrity: Intact. Macroscopic intactness of mesorectum: Not applicable. Macroscopic tumor perforation: Yes, adenocarcinoma macroscopically  perforates serosa. Invasive tumor: Maximum size: 5 cm. Histologic type(s): Adenocarcinoma. Histologic grade and differentiation G2: moderately differentiated/low grade. Type of polyp in which invasive carcinoma arose: A definitive precursor polyp is not identified. Microscopic extension of invasive tumor: Tumor invades through the entire colon and perforates the serosa. Lymph-Vascular invasion: Definitive lymph/vascular invasion is not identified, however there is lymph node positivity, see below Peri-neural invasion: Not identified. Tumor deposit(s) (discontinuous extramural extension): No tumor deposits are identified. Resection margins: Proximal margin: Negative. Distal margin: Negative. Circumferential (radial) (posterior ascending, posterior descending; lateral and posterior mid-rectum; and entire lower 1/3 rectum): Tumor involves pelvic sidewall margin (positive margin), see below comment. Mesenteric margin (sigmoid and transverse): Not applicable. Distance closest margin (if all above margins negative): Tumor invades into pelvic sidewall with positive pelvic sidewall margin, see below comment. Trans-anal resection margins only: Deep margin: Not applicable. Mucosal Margin: Not applicable. Distance closest mucosal margin (if negative): Not applicable. Treatment effect (neo-adjuvant therapy): Not applicable. Additional polyp(s): The possible mucosal polyp seen grossly simply demonstrates benign polypoid colorectal mucosa without a true polyp identified. Non-neoplastic findings: No additional significant non-neoplastic findings. Lymph nodes: number examined 43; number positive: 2. Pathologic Staging: pT4b, pN1b, R2, see below comment. Ancillary studies: The tumor will be sent for MSI by PCR and MMR by [facility] per colorectal cancer protocol. Comments: As the tumor is perforated with invasion into the pelvic sidewall, this is assessed as representing direct extension into another  structure and is therefore compatible with pT4b tumor. The margin as indicated by the surgeon of the pelvic sidewall is grossly positive on pathologic gross examination and shows broad positivity on microscopic examination. The findings are consistent with the above assessment. The case was discussed with Dr. [name] on 02/09/2016. 3. Of note, the right fallopian tube in the third specimen demonstrates findings suggestive of previous tubal ligation. Please correlate with surgical history. (RH:kh 02-09-16) Willeen Niece MD Pathologist, Electronic Signature  1. Mismatch Repair (MMR) Protein Immunohistochemistry (IHC) IHC Expression Result: MLH1: Preserved nuclear expression (greater 50% tumor expression) MSH2: Preserved nuclear expression (greater  50% tumor expression) MSH6: Preserved nuclear expression (greater 50% tumor expression) PMS2: Preserved nuclear expression (greater 50% tumor expression) * Internal control demonstrates intact nuclear expression     RADIOGRAPHIC STUDIES: I have personally reviewed the radiological images as listed and agreed with the findings in the report. No results found.  ASSESSMENT & PLAN:  66 year old female, with past medical history of hypertension and abdominal aorta aneurysm, presented with perforation right cecum colon cancer.  1. Cancer of ascending colon, pT4bN1bM0, stage IIIC, MSI-stable, (+) surgical margins at pelvic wall     -I previously discussed her surgical pathology report and prior CT scan findings in great details with patient and her daughter.  -I reviewed her staging CT chest, which was negative for metastasis. --Due to the locally advanced disease, perforated tumor, and positive surgical margins, she is at extremely high risk for cancer recurrence. -Her case was discussed in our tumor board, we recommend adjuvant chemotherapy and irradiation -She has completed new adjuvant chemotherapy and irradiation, throat the dose was reduced due  to her neutropenia. -She stopped the Xeloda for the last week of radiation, due to difficulty swallowing the pills. -I previously strongly recommend her to consider adjuvant chemotherapy, due to her extremely high risk of recurrence. After lengthy discussion, patient declined chemotherapy due to the concern of side effects and impact on her quality of life. -Pt voiced good understanding of the benefit of adjuvant chemotherapy, which will reduce but not limited her risk of recurrence. She states she will not regret about her decision of not pursuing adjuvant chemotherapy. -She is agreeable with cancer surveillance. -She is clinically doing well, asymptomatic, lab reviewed, stable moderate anemia, CEA is still pending. No clinical high suspicion for recurrence for now. -Repeat CT scan in 2 months prior to follow up. Labs, including CBC, CMP, CEA, and iron study dthen.  2. HTN -She'll follow-up with her primary care physician and continue medication -we discussed that chemotherapy may affect her blood pressure, we'll monitor her pressures closely -I have held her Maxide due to her dizziness  3. Anemia -Her anemia is related to her surgery and iron deficiency. Iron studies showed evidence of iron deficiency. -She previously took iron pill before, and had some constipation. She is agreeable to restart ferrous sulfate 1-2 tablets a day.  -We'll consider IV Feraheme if she does not respond well to oral iron supplement.  4. Leukopenia -She developed mild leukopenia and neutropenia since she started chemotherapy and radiation, overall stable, but has not resolved. -We discussed if her leukopenia does not resolve in 6 months or get worse, we may consider bone marrow biopsy.  Plan - -I encouraged the patient to restart oral iron. -Follow up in 2 months -Labs (CBC, CMP, CEA, and iron study) and CT CAP with contrast a few days prior to the follow up.   All questions were answered. The patient knows to  call the clinic with any problems, questions or concerns.  I spent 20 minutes counseling the patient face to face. The total time spent in the appointment was 25 minutes and more than 50% was on counseling.     [name], MD 07/22/2016   This document serves as a record of services personally performed by [name], MD. It was created on her behalf by Darcus Austin, a trained medical scribe. The creation of this record is based on the scribe's personal observations and the provider's statements to them. This document has been checked and approved by the attending provider.

## 2016-07-22 ENCOUNTER — Encounter: Payer: Self-pay | Admitting: Hematology

## 2016-07-22 ENCOUNTER — Telehealth: Payer: Self-pay | Admitting: Hematology

## 2016-07-22 ENCOUNTER — Ambulatory Visit (HOSPITAL_BASED_OUTPATIENT_CLINIC_OR_DEPARTMENT_OTHER): Payer: Medicare Other | Admitting: Hematology

## 2016-07-22 ENCOUNTER — Other Ambulatory Visit (HOSPITAL_BASED_OUTPATIENT_CLINIC_OR_DEPARTMENT_OTHER): Payer: Federal, State, Local not specified - PPO

## 2016-07-22 VITALS — BP 130/89 | HR 79 | Temp 98.5°F | Resp 18 | Ht 65.0 in | Wt 123.5 lb

## 2016-07-22 DIAGNOSIS — I10 Essential (primary) hypertension: Secondary | ICD-10-CM

## 2016-07-22 DIAGNOSIS — D500 Iron deficiency anemia secondary to blood loss (chronic): Secondary | ICD-10-CM | POA: Diagnosis not present

## 2016-07-22 DIAGNOSIS — D701 Agranulocytosis secondary to cancer chemotherapy: Secondary | ICD-10-CM

## 2016-07-22 DIAGNOSIS — C182 Malignant neoplasm of ascending colon: Secondary | ICD-10-CM | POA: Diagnosis present

## 2016-07-22 DIAGNOSIS — D509 Iron deficiency anemia, unspecified: Secondary | ICD-10-CM

## 2016-07-22 LAB — CBC & DIFF AND RETIC
BASO%: 0.4 % (ref 0.0–2.0)
BASOS ABS: 0 10*3/uL (ref 0.0–0.1)
EOS%: 4 % (ref 0.0–7.0)
Eosinophils Absolute: 0.1 10*3/uL (ref 0.0–0.5)
HEMATOCRIT: 31.1 % — AB (ref 34.8–46.6)
HEMOGLOBIN: 9.9 g/dL — AB (ref 11.6–15.9)
IMMATURE RETIC FRACT: 3.8 % (ref 1.60–10.00)
LYMPH#: 0.9 10*3/uL (ref 0.9–3.3)
LYMPH%: 35.5 % (ref 14.0–49.7)
MCH: 27.1 pg (ref 25.1–34.0)
MCHC: 31.8 g/dL (ref 31.5–36.0)
MCV: 85.2 fL (ref 79.5–101.0)
MONO#: 0.4 10*3/uL (ref 0.1–0.9)
MONO%: 14.5 % — ABNORMAL HIGH (ref 0.0–14.0)
NEUT#: 1.1 10*3/uL — ABNORMAL LOW (ref 1.5–6.5)
NEUT%: 45.6 % (ref 38.4–76.8)
PLATELETS: 163 10*3/uL (ref 145–400)
RBC: 3.65 10*6/uL — ABNORMAL LOW (ref 3.70–5.45)
RDW: 15.7 % — ABNORMAL HIGH (ref 11.2–14.5)
RETIC CT ABS: 47.45 10*3/uL (ref 33.70–90.70)
Retic %: 1.3 % (ref 0.70–2.10)
WBC: 2.5 10*3/uL — ABNORMAL LOW (ref 3.9–10.3)

## 2016-07-22 LAB — COMPREHENSIVE METABOLIC PANEL
ALBUMIN: 3.8 g/dL (ref 3.5–5.0)
ALK PHOS: 68 U/L (ref 40–150)
ALT: 12 U/L (ref 0–55)
ANION GAP: 9 meq/L (ref 3–11)
AST: 21 U/L (ref 5–34)
BUN: 15.1 mg/dL (ref 7.0–26.0)
CALCIUM: 9.3 mg/dL (ref 8.4–10.4)
CO2: 26 mEq/L (ref 22–29)
Chloride: 104 mEq/L (ref 98–109)
Creatinine: 0.8 mg/dL (ref 0.6–1.1)
EGFR: 83 mL/min/{1.73_m2} — AB (ref >90)
Glucose: 107 mg/dl (ref 70–140)
POTASSIUM: 3.9 meq/L (ref 3.5–5.1)
SODIUM: 139 meq/L (ref 136–145)
Total Bilirubin: 0.46 mg/dL (ref 0.20–1.20)
Total Protein: 7.3 g/dL (ref 6.4–8.3)

## 2016-07-22 LAB — FERRITIN: FERRITIN: 9 ng/mL (ref 9–269)

## 2016-07-22 LAB — CEA (IN HOUSE-CHCC): CEA (CHCC-IN HOUSE): 1.42 ng/mL (ref 0.00–5.00)

## 2016-07-22 NOTE — Telephone Encounter (Signed)
Appointments scheduled per 1/22 LOS. Patient given AVS report and calendars with future scheduled appointments. Patient given two bottles of contrast and instructions for CT scan appointment.

## 2016-07-23 ENCOUNTER — Telehealth: Payer: Self-pay | Admitting: *Deleted

## 2016-07-23 NOTE — Telephone Encounter (Signed)
Called pt at home and left message on voice mail requesting a call back to nurse for results of iron study.

## 2016-07-23 NOTE — Telephone Encounter (Signed)
-----   Message from [name], MD sent at 07/23/2016  8:15 AM EST ----- Please let pt know that her iron level is still low, restart oral iron as we discussed yesterday, and she can let us know if she decides to take iv iron. Thanks.  [name]  07/23/2016

## 2016-09-16 ENCOUNTER — Ambulatory Visit (HOSPITAL_COMMUNITY)
Admission: RE | Admit: 2016-09-16 | Discharge: 2016-09-16 | Disposition: A | Discharge status: Home / Self Care | Payer: Federal, State, Local not specified - PPO | Source: Ambulatory Visit | Attending: Hematology | Admitting: Hematology

## 2016-09-16 ENCOUNTER — Other Ambulatory Visit (HOSPITAL_BASED_OUTPATIENT_CLINIC_OR_DEPARTMENT_OTHER): Payer: Federal, State, Local not specified - PPO

## 2016-09-16 DIAGNOSIS — I318 Other specified diseases of pericardium: Secondary | ICD-10-CM | POA: Diagnosis present

## 2016-09-16 DIAGNOSIS — R911 Solitary pulmonary nodule: Secondary | ICD-10-CM | POA: Insufficient documentation

## 2016-09-16 DIAGNOSIS — D259 Leiomyoma of uterus, unspecified: Secondary | ICD-10-CM | POA: Diagnosis not present

## 2016-09-16 DIAGNOSIS — D509 Iron deficiency anemia, unspecified: Secondary | ICD-10-CM

## 2016-09-16 DIAGNOSIS — C182 Malignant neoplasm of ascending colon: Secondary | ICD-10-CM

## 2016-09-16 DIAGNOSIS — D500 Iron deficiency anemia secondary to blood loss (chronic): Secondary | ICD-10-CM

## 2016-09-16 LAB — CBC & DIFF AND RETIC
BASO%: 1.3 % (ref 0.0–2.0)
Basophils Absolute: 0 10*3/uL (ref 0.0–0.1)
EOS ABS: 0.1 10*3/uL (ref 0.0–0.5)
EOS%: 3.6 % (ref 0.0–7.0)
HCT: 33.4 % — ABNORMAL LOW (ref 34.8–46.6)
HGB: 10.5 g/dL — ABNORMAL LOW (ref 11.6–15.9)
IMMATURE RETIC FRACT: 1.7 % (ref 1.60–10.00)
LYMPH%: 43 % (ref 14.0–49.7)
MCH: 26.3 pg (ref 25.1–34.0)
MCHC: 31.4 g/dL — ABNORMAL LOW (ref 31.5–36.0)
MCV: 83.5 fL (ref 79.5–101.0)
MONO#: 0.3 10*3/uL (ref 0.1–0.9)
MONO%: 11.7 % (ref 0.0–14.0)
NEUT%: 40.4 % (ref 38.4–76.8)
NEUTROS ABS: 0.9 10*3/uL — AB (ref 1.5–6.5)
PLATELETS: 159 10*3/uL (ref 145–400)
RBC: 4 10*6/uL (ref 3.70–5.45)
RDW: 14 % (ref 11.2–14.5)
RETIC %: 0.69 % — AB (ref 0.70–2.10)
Retic Ct Abs: 27.6 10*3/uL — ABNORMAL LOW (ref 33.70–90.70)
WBC: 2.2 10*3/uL — ABNORMAL LOW (ref 3.9–10.3)
lymph#: 1 10*3/uL (ref 0.9–3.3)

## 2016-09-16 LAB — COMPREHENSIVE METABOLIC PANEL
ALT: 12 U/L (ref 0–55)
ANION GAP: 8 meq/L (ref 3–11)
AST: 18 U/L (ref 5–34)
Albumin: 4 g/dL (ref 3.5–5.0)
Alkaline Phosphatase: 78 U/L (ref 40–150)
BUN: 19.3 mg/dL (ref 7.0–26.0)
CHLORIDE: 105 meq/L (ref 98–109)
CO2: 27 meq/L (ref 22–29)
Calcium: 9.6 mg/dL (ref 8.4–10.4)
Creatinine: 0.9 mg/dL (ref 0.6–1.1)
EGFR: 73 mL/min/{1.73_m2} — AB (ref >90)
GLUCOSE: 89 mg/dL (ref 70–140)
Potassium: 5.1 mEq/L (ref 3.5–5.1)
SODIUM: 140 meq/L (ref 136–145)
TOTAL PROTEIN: 7.5 g/dL (ref 6.4–8.3)
Total Bilirubin: 0.33 mg/dL (ref 0.20–1.20)

## 2016-09-16 MED ORDER — IOPAMIDOL (ISOVUE-300) INJECTION 61%
INTRAVENOUS | Status: AC
  Administered 2016-09-16: 80 mL
  Filled 2016-09-16: qty 100

## 2016-09-16 NOTE — Progress Notes (Signed)
[place]  [phone] [phone]  Clinic Follow Up Note   Patient Care Team: Pcp Not In System as PCP - General 09/23/2016  CHIEF COMPLAINTS:  Follow up stage IIIC right colon cancer   Oncology History   Cancer of ascending colon [facility])   Staging form: Colon and Rectum, AJCC 7th Edition   - Clinical stage from 02/07/2016: Stage IIIC (T4b, N1b, M0) - Signed by [name], MD on 03/04/2016      Cancer of ascending colon ([place])   10/25/2015 Imaging    CT ABD/PELVIS:  Inflammatory changes inferior to the cecal tip appear improved, there is still irregular soft tissue thickening of the cecal tip, and there are adjacent prominent lymph nodes in the ileocolonic mesentery, measuring 13 mm on image 49 and 8 mm on image 52. In addition, there is a 2.5 x 1.8 cm nodule on image 46 which has central low density. Therefore, these findings are moderately suspicious for an underlying cecal malignancy with perforation.       01/19/2016 Procedure    COLONOSCOPY per Dr. [name]: Fungating, ulcerated mass almost obstructing mid ascending colon      02/05/2016 Tumor Marker    Patient's tumor was tested for the following markers: CEA Results of the tumor marker test revealed 5.7.      02/07/2016 Initial Diagnosis    Cancer of ascending colon ([place])     02/07/2016 Definitive Surgery    Laparoscopic assisted right hemicolectomy and right salpingo oopherectomy--Dr. [name]      02/07/2016 Pathologic Stage    p T4 N1b   2/43 nodes +      02/07/2016 Pathology Results    MMR normal; G2 adenocarcinoma;proximal & distal margins negative; soft tissue mass on pelvic sidewall + for adenocarcinoma with positive margin MSI Stable      03/08/2016 Imaging    CT chest negative for metastasis.       03/19/2016 - 04/25/2016 Radiation Therapy    Adjuvant irradiation, 50 gray in 28 fractions      03/19/2016 - 04/22/2016 Chemotherapy    Xeloda 1500 mg twice daily, started on  03/19/2016, dose reduced to 1000 mg twice daily from week 3 due to neutropenia, and patient stopped 3 days before last dose radiation due to difficulty swallowing the pill       05/20/2016 -  Adjuvant Chemotherapy    Patient declined adjuvant chemotherapy      09/16/2016 Imaging    CT CAP w Contrast 1. No evidence of local tumor recurrence at the ileocolic anastomosis. 2. No findings suspicious for metastatic disease in the chest, abdomen or pelvis. 3. Nonspecific trace free fluid in the pelvic cul-de-sac. 4. Stable solitary 3 mm right upper lobe pulmonary nodule, for which 6 month stability has been demonstrated, probably benign. 5. Additional findings include stable right posterior pericardial cyst and small calcified uterine fibroids.       HISTORY OF PRESENTING ILLNESS:  Brittney Spencer 66 y.o. female is here because of her newly diagnsosed ascending colon cancer. She is accompanied by her daughter to my clinic today.   She initially presented with vaginal discharge and gastric discomfot., and was seen by her GYN and underwent CT scan which showedA perforated appendicitis. She was treated nonoperatively with IV antibiotics with resolution of her symptoms. Posttreatment CT scan questioned  Some thickening of the cecum, question neoplasm. Colonoscopy was recommended and was delayed somewhat due to patient issues and revealed an obvious malignancy in the cecum  with partial obstruction. Biopsy revealed adenocarcinoma. She was referred to Dr. Hoxworth and underwent laparoscopic-assisted and possible open right hemicolectomy on 02/07/2016. Per the OR note, at laparoscopy there was a bulky somewhat fixed tumor in the cecum. There was found to be a perforated cancer with a chronic cavity invading into the posterior lateral retroperitoneum.  The cancer was very bulky and locally invasive but was grossly completely removed  With ileotransverse colostomy. Final pathology revealed a 5 cm tumor  With  microscopically broadly positive lateral margin in the retroperitoneum (grossly negative by inspection of the time of surgery) and 2 out of 42 lymph nodes positive.Dr. Hoxworth marked the retroperitoneum with clips for possible radiation therapy. Postoperative course was unremarkable.   She has been recovering well from surgery. She has minimum pain at the incision site, staples removed, no infection or other complications. Her appetite and energy level have much improved, although not back to normal yet. She lost about 10 lbs in the past 5 month, but gained a few lbs lately.   CURRENT THERAPY: Surveillance  INTERIM HISTORY:  Brittney Spencer returns for follow up. She has been doing well. She has been eating many small meals throughout the day. Denies nausea, vomiting, bowel problems, fatigue, or any other concerns.   MEDICAL HISTORY:  Past Medical History:  Diagnosis Date  . AAA (abdominal aortic aneurysm) ([facility])    infrarenal 4.1 cmper s-9-19 scan on chart  . Anemia   . Anxiety   . Colon cancer ([facility])   . Hypertension   . Vitamin D deficiency     SURGICAL HISTORY: Past Surgical History:  Procedure Laterality Date  . COLONSCOPY  12/2015  . LAPAROSCOPIC RIGHT HEMI COLECTOMY Right 02/07/2016   Procedure: LAPAROSCOPIC ASSISTED RIGHT HEMI COLECTOMY AND RIGHT SALPINGO OOPHERECTOMY;  Surgeon: Benjamin Hoxworth, MD;  Location: [facility];  Service: General;  Laterality: Right;    SOCIAL HISTORY: Social History   Social History  . Marital status: Widowed    Spouse name: N/A  . Number of children: N/A  . Years of education: N/A   Occupational History  . Not on file.   Social History Main Topics  . Smoking status: Never Smoker  . Smokeless tobacco: Never Used  . Alcohol use No  . Drug use: No  . Sexual activity: Yes    Birth control/ protection: None   Other Topics Concern  . Not on file   Social History Narrative  . No narrative on file   She is retired  She has one daughter    FAMILY HISTORY: Family History  Problem Relation Age of Onset  . Cancer Mother     lung cancer  . Cancer Maternal Grandfather     unknown cancer   . Colon cancer Neg Hx     ALLERGIES:  is allergic to fish allergy; peanut-containing drug products; soy allergy; and buspirone.  MEDICATIONS:  Current Outpatient Prescriptions  Medication Sig Dispense Refill  . ALPRAZolam (XANAX) 0.25 MG tablet Take 0.25 mg by mouth daily as needed for anxiety.   3  . NORVASC 2.5 MG tablet Take 1 tablet (2.5 mg total) by mouth daily. 90 tablet 1  . acetaminophen (TYLENOL) 325 MG tablet Take 2 tablets (650 mg total) by mouth every 6 (six) hours as needed for mild pain, moderate pain, fever or headache. (Patient not taking: Reported on 05/28/2016)    . HYDROcodone-acetaminophen (NORCO/VICODIN) 5-325 MG tablet Take 1-2 tablets by mouth every 4 (four) hours as needed for moderate   pain or severe pain. (Patient not taking: Reported on 05/28/2016) 30 tablet 0  . ondansetron (ZOFRAN) 4 MG tablet Take 1 tablet (4 mg total) by mouth every 6 (six) hours as needed for nausea. (Patient not taking: Reported on 05/28/2016) 10 tablet 0  . oxyCODONE-acetaminophen (PERCOCET/ROXICET) 5-325 MG tablet Take 1-2 tablets by mouth every 4 (four) hours as needed for moderate pain. (Patient not taking: Reported on 05/28/2016) 40 tablet 0  . polyethylene glycol (MIRALAX / GLYCOLAX) packet 1 dose daily, you can buy this as any pharmacy without a prescription. (Patient not taking: Reported on 09/23/2016) 14 each 0  . Probiotic Product ([employer] PROBIOTIC PO) Take 1 capsule by mouth as directed. Takes  3 - 4 times per week per pt.     No current facility-administered medications for this visit.     REVIEW OF SYSTEMS:   Constitutional: Denies fevers, chills or abnormal night sweats  Eyes: Denies blurriness of vision, double vision or watery eyes Ears, nose, mouth, throat, and face: Denies mucositis or sore throat Respiratory: Denies cough,  dyspnea or wheezes Cardiovascular: Denies palpitation, chest discomfort or lower extremity swelling Gastrointestinal:  Denies nausea, heartburn or change in bowel habits Skin: Denies abnormal skin rashes Lymphatics: Denies new lymphadenopathy or easy bruising Neurological:Denies numbness, tingling or new weaknesses Behavioral/Psych: Mood is stable, no new changes  All other systems were reviewed with the patient and are negative.  PHYSICAL EXAMINATION: ECOG PERFORMANCE STATUS: 1  Vitals:   09/23/16 1558  BP: (!) 154/75  Pulse: 85  Resp: 18  Temp: 98.1 F (36.7 C)   Filed Weights   09/23/16 1558  Weight: 121 lb 11.2 oz (55.2 kg)   GENERAL:alert, no distress and comfortable SKIN: skin color, texture, turgor are normal, no rashes or significant lesions EYES: normal, conjunctiva are pink and non-injected, sclera clear OROPHARYNX:no exudate, no erythema and lips, buccal mucosa, and tongue normal  NECK: supple, thyroid normal size, non-tender, without nodularity LYMPH:  no palpable lymphadenopathy in the cervical, axillary or inguinal LUNGS: clear to auscultation and percussion with normal breathing effort HEART: regular rate & rhythm and no murmurs and no lower extremity edema ABDOMEN:abdomen soft, non-tender and normal bowel sounds, surgical wound has healed well. Musculoskeletal:no cyanosis of digits and no clubbing  PSYCH: alert & oriented x 3 with fluent speech NEURO: no focal motor/sensory deficits  LABORATORY DATA:   I have reviewed the data as listed CBC Latest Ref Rng & Units 09/16/2016 07/22/2016 05/28/2016  WBC 3.9 - 10.3 10e3/uL 2.2(L) 2.5(L) 2.1(L)  Hemoglobin 11.6 - 15.9 g/dL 10.5(L) 9.9(L) 9.9(L)  Hematocrit 34.8 - 46.6 % 33.4(L) 31.1(L) 31.3(L)  Platelets 145 - 400 10e3/uL 159 163 175   CMP Latest Ref Rng & Units 09/16/2016 07/22/2016 05/28/2016  Glucose 70 - 140 mg/dl 89 107 85  BUN 7.0 - 26.0 mg/dL 19.3 15.1 12.4  Creatinine 0.6 - 1.1 mg/dL 0.9 0.8 0.8  Sodium  136 - 145 mEq/L 140 139 141  Potassium 3.5 - 5.1 mEq/L 5.1 3.9 4.9  Chloride 101 - 111 mmol/L - - -  CO2 22 - 29 mEq/L 27 26 26  Calcium 8.4 - 10.4 mg/dL 9.6 9.3 9.6  Total Protein 6.4 - 8.3 g/dL 7.5 7.3 7.2  Total Bilirubin 0.20 - 1.20 mg/dL 0.33 0.46 0.27  Alkaline Phos 40 - 150 U/L 78 68 64  AST 5 - 34 U/L 18 21 17  ALT 0 - 55 U/L 12 12 7   ANC 0.9 today   Results   for Spencer, Brittney (MRN [id number]) as of 09/23/2016 16:18  Ref. Range 02/05/2016 09:40 02/27/2016 16:15 04/23/2016 13:27 07/22/2016 12:20 09/16/2016 15:17  CEA ([facility]) Latest Ref Range: 0.00 - 5.00 ng/mL  1.23 1.40 1.42 1.45   Pathology report  Diagnosis 02/07/2016 1. Colon, segmental resection for tumor, right - INVASIVE MODERATELY DIFFERENTIATED ADENOCARCINOMA, SPANNING 5 CM IN GREATEST DIMENSION. - TUMOR ARISES FROM THE CECUM AND INVADES THROUGH THE FULL THICKNESS OF THE BOWEL AND PERFORATES THE SEROSA. - PROXIMAL AND DISTAL MARGINS ARE NEGATIVE (PLEASE SEE SPECIMEN #2 FOR FINAL MARGIN STATUS). - TWO OF FORTY-THREE LYMPH NODES POSITIVE FOR METASTATIC COLORECTAL ADENOCARCINOMA (2/43). - SEE ONCOLOGY TEMPLATE. 2. Soft tissue mass, simple excision, pelvic side wall - POSITIVE FOR COLORECTAL ADENOCARCINOMA. - MARGIN IS BROADLY POSITIVE. 3. Adnexa - ovary +/- tube, neoplastic, right - OVARIAN FIBROMA WITH ASSOCIATED CALCIFICATIONS. - BENIGN FALLOPIAN TUBE - NO ATYPIA OR MALIGNANCY IDENTIFIED. - SEE COMMENT. Microscopic Comment 1. COLON AND RECTUM (INCLUDING TRANS-ANAL RESECTION): Specimen: Right partial colon with additional right pelvic sidewall tissue. Procedure: Right hemicolectomy with lateral pelvic sidewall excision. Tumor site: Cecum. Specimen integrity: Intact. Macroscopic intactness of mesorectum: Not applicable. Macroscopic tumor perforation: Yes, adenocarcinoma macroscopically perforates serosa. Invasive tumor: Maximum size: 5 cm. Histologic type(s): Adenocarcinoma. Histologic grade and  differentiation G2: moderately differentiated/low grade. Type of polyp in which invasive carcinoma arose: A definitive precursor polyp is not identified. Microscopic extension of invasive tumor: Tumor invades through the entire colon and perforates the serosa. Lymph-Vascular invasion: Definitive lymph/vascular invasion is not identified, however there is lymph node positivity, see below Peri-neural invasion: Not identified. Tumor deposit(s) (discontinuous extramural extension): No tumor deposits are identified. Resection margins: Proximal margin: Negative. Distal margin: Negative. Circumferential (radial) (posterior ascending, posterior descending; lateral and posterior mid-rectum; and entire lower 1/3 rectum): Tumor involves pelvic sidewall margin (positive margin), see below comment. Mesenteric margin (sigmoid and transverse): Not applicable. Distance closest margin (if all above margins negative): Tumor invades into pelvic sidewall with positive pelvic sidewall margin, see below comment. Trans-anal resection margins only: Deep margin: Not applicable. Mucosal Margin: Not applicable. Distance closest mucosal margin (if negative): Not applicable. Treatment effect (neo-adjuvant therapy): Not applicable. Additional polyp(s): The possible mucosal polyp seen grossly simply demonstrates benign polypoid colorectal mucosa without a true polyp identified. Non-neoplastic findings: No additional significant non-neoplastic findings. Lymph nodes: number examined 43; number positive: 2. Pathologic Staging: pT4b, pN1b, R2, see below comment. Ancillary studies: The tumor will be sent for MSI by PCR and MMR by IHC per colorectal cancer protocol. Comments: As the tumor is perforated with invasion into the pelvic sidewall, this is assessed as representing direct extension into another structure and is therefore compatible with pT4b tumor. The margin as indicated by the surgeon of the pelvic sidewall is  grossly positive on pathologic gross examination and shows broad positivity on microscopic examination. The findings are consistent with the above assessment. The case was discussed with Dr. Hoxworth on 02/09/2016. 3. Of note, the right fallopian tube in the third specimen demonstrates findings suggestive of previous tubal ligation. Please correlate with surgical history. (RH:kh 02-09-16) ROBERT HILLARD MD Pathologist, Electronic Signature  1. Mismatch Repair (MMR) Protein Immunohistochemistry (IHC) IHC Expression Result: MLH1: Preserved nuclear expression (greater 50% tumor expression) MSH2: Preserved nuclear expression (greater 50% tumor expression) MSH6: Preserved nuclear expression (greater 50% tumor expression) PMS2: Preserved nuclear expression (greater 50% tumor expression) * Internal control demonstrates intact nuclear expression     RADIOGRAPHIC STUDIES: I have personally reviewed the radiological images as listed   and agreed with the findings in the report.  CT CAP w Contrast 09/16/2016 IMPRESSION: 1. No evidence of local tumor recurrence at the ileocolic anastomosis. 2. No findings suspicious for metastatic disease in the chest, abdomen or pelvis. 3. Nonspecific trace free fluid in the pelvic cul-de-sac. 4. Stable solitary 3 mm right upper lobe pulmonary nodule, for which 6 month stability has been demonstrated, probably benign. 5. Additional findings include stable right posterior pericardial cyst and small calcified uterine fibroids.  ASSESSMENT & PLAN:   66 y.o. female, with past medical history of hypertension and abdominal aorta aneurysm, presented with perforation right cecum colon cancer.  1. Cancer of ascending colon, pT4bN1bM0, stage IIIC, MSI-stable, (+) surgical margins at pelvic wall     -I previously discussed her surgical pathology report and prior CT scan findings in great details with patient and her daughter.  --Due to the locally advanced disease,  perforated tumor, and positive surgical margins, she is at extremely high risk for cancer recurrence. -Her case was discussed in our tumor board, we recommend adjuvant chemotherapy and irradiation -She has completed adjuvant radiation and chemo, Xeloda dose was reduced due to her neutropenia. -She stopped the Xeloda for the last week of radiation, due to difficulty swallowing the pills. -I previously strongly recommend her to consider adjuvant chemotherapy, due to her extremely high risk of recurrence. After lengthy discussion, patient declined chemotherapy due to the concern of side effects and impact on her quality of life. -Pt voiced good understanding of the benefit of adjuvant chemotherapy, which will reduce but not limited her risk of recurrence. She states she will not regret about her decision of not pursuing adjuvant chemotherapy. -She is clinically doing well, asymptomatic, lab reviewed, stable moderate anemia, CEA has been normal post-op. No clinical high suspicion for recurrence for now. -I reviewed the CT scan form 09/16/2016 with the patient in detail. NED -she has had repeat colonoscopy in Dec 2017, next due in 3 years  -She will continue cancer surveillance. Due to her high risk for recurrence, I plan to see her back every 3 months for the next 2-3 years, with his surveillance CT scans every 6 months  2. HTN -She'll follow-up with her primary care physician and continue medication -we discussed that chemotherapy may affect her blood pressure, we'll monitor her pressures closely -I have held her Maxide due to her dizziness  3. Anemia -Her anemia is related to her surgery and iron deficiency. Iron studies showed evidence of iron deficiency. -She previously took iron pill before, and had some constipation. She is agreeable to restart ferrous sulfate 1-2 tablets a day.  -She has not been taking her iron very often. I encouraged her to take it daily.  -Due to her persistent anemia, I  recommended IV iron; she declined.   4. Leukopenia -She developed mild leukopenia and neutropenia since she started chemotherapy and radiation, overall stable, but has not resolved. Possibly related to chemotherapy and radiation. -I recommended a bone marrow biopsy since her counts does not improve on next visit. She will think about it.   Plan  -Follow up in 3 months with labs. If she has persistent neutropenia on next visit, we will consider to obtain a bone marrow biopsy.  -repeat surveillance CT scanning in 6 months  All questions were answered. The patient knows to call the clinic with any problems, questions or concerns.  I spent 20 minutes counseling the patient face to face. The total time spent in the appointment was  25 minutes and more than 50% was on counseling.  This document serves as a record of services personally performed by [name], MD. It was created on her behalf by Martinique Casey, a trained medical scribe. The creation of this record is based on the scribe's personal observations and the provider's statements to them. This document has been checked and approved by the attending provider.  I have reviewed the above documentation for accuracy and completeness and I agree with the above.   [name], MD 09/23/2016

## 2016-09-17 LAB — IRON AND TIBC
%SAT: 20 % — ABNORMAL LOW (ref 21–57)
Iron: 76 ug/dL (ref 41–142)
TIBC: 371 ug/dL (ref 236–444)
UIBC: 295 ug/dL (ref 120–384)

## 2016-09-17 LAB — CEA (IN HOUSE-CHCC): CEA (CHCC-IN HOUSE): 1.45 ng/mL (ref 0.00–5.00)

## 2016-09-17 LAB — FERRITIN: Ferritin: 9 ng/ml (ref 9–269)

## 2016-09-23 ENCOUNTER — Ambulatory Visit (HOSPITAL_BASED_OUTPATIENT_CLINIC_OR_DEPARTMENT_OTHER): Payer: Federal, State, Local not specified - PPO | Admitting: Hematology

## 2016-09-23 ENCOUNTER — Encounter: Payer: Self-pay | Admitting: Hematology

## 2016-09-23 VITALS — BP 154/75 | HR 85 | Temp 98.1°F | Resp 18 | Ht 65.0 in | Wt 121.7 lb

## 2016-09-23 DIAGNOSIS — D72819 Decreased white blood cell count, unspecified: Secondary | ICD-10-CM | POA: Diagnosis not present

## 2016-09-23 DIAGNOSIS — D500 Iron deficiency anemia secondary to blood loss (chronic): Secondary | ICD-10-CM

## 2016-09-23 DIAGNOSIS — D509 Iron deficiency anemia, unspecified: Secondary | ICD-10-CM

## 2016-09-23 DIAGNOSIS — I10 Essential (primary) hypertension: Secondary | ICD-10-CM | POA: Diagnosis not present

## 2016-09-23 DIAGNOSIS — C182 Malignant neoplasm of ascending colon: Secondary | ICD-10-CM | POA: Diagnosis not present

## 2016-09-25 ENCOUNTER — Telehealth: Payer: Self-pay | Admitting: Hematology

## 2016-09-25 NOTE — Telephone Encounter (Signed)
Lab and follow up appointments scheduled per 09/23/16 los.  Appointment letter and schedule was mailed to patient, per 09/23/16 los. 09/25/16

## 2016-11-07 ENCOUNTER — Telehealth: Payer: Self-pay

## 2016-11-07 DIAGNOSIS — I10 Essential (primary) hypertension: Secondary | ICD-10-CM

## 2016-11-07 MED ORDER — NORVASC 2.5 MG PO TABS: 2.5000 mg | ORAL_TABLET | Freq: Every day | ORAL | 1 refills | Status: DC

## 2016-11-07 NOTE — Telephone Encounter (Signed)
Pt called for norvasc refill.

## 2016-12-19 ENCOUNTER — Encounter: Payer: Self-pay | Admitting: Gastroenterology

## 2016-12-27 ENCOUNTER — Telehealth: Payer: Self-pay | Admitting: Hematology

## 2016-12-27 NOTE — Telephone Encounter (Signed)
sw pt to confirm r/s appt to 7/23 at 1245 per YF

## 2016-12-30 ENCOUNTER — Other Ambulatory Visit: Payer: Federal, State, Local not specified - PPO

## 2016-12-30 ENCOUNTER — Ambulatory Visit: Payer: Federal, State, Local not specified - PPO | Admitting: Hematology

## 2017-01-17 ENCOUNTER — Encounter: Payer: Self-pay | Admitting: Gastroenterology

## 2017-01-17 NOTE — Progress Notes (Signed)
[place]  [phone] [phone]  Clinic Follow Up Note   Patient Care Team: System, Pcp Not In as PCP - General 01/20/2017  CHIEF COMPLAINTS:  Follow up stage IIIC right colon cancer   Oncology History   Cancer of ascending colon [facility])   Staging form: Colon and Rectum, AJCC 7th Edition   - Clinical stage from 02/07/2016: Stage IIIC (T4b, N1b, M0) - Signed by [name], MD on 03/04/2016      Cancer of ascending colon ([place])   10/25/2015 Imaging    CT ABD/PELVIS:  Inflammatory changes inferior to the cecal tip appear improved, there is still irregular soft tissue thickening of the cecal tip, and there are adjacent prominent lymph nodes in the ileocolonic mesentery, measuring 13 mm on image 49 and 8 mm on image 52. In addition, there is a 2.5 x 1.8 cm nodule on image 46 which has central low density. Therefore, these findings are moderately suspicious for an underlying cecal malignancy with perforation.       01/19/2016 Procedure    COLONOSCOPY per Dr. [name]: Fungating, ulcerated mass almost obstructing mid ascending colon      02/05/2016 Tumor Marker    Patient's tumor was tested for the following markers: CEA Results of the tumor marker test revealed 5.7.      02/07/2016 Initial Diagnosis    Cancer of ascending colon ([place])     02/07/2016 Definitive Surgery    Laparoscopic assisted right hemicolectomy and right salpingo oopherectomy--Dr. [name]      02/07/2016 Pathologic Stage    p T4 N1b   2/43 nodes +      02/07/2016 Pathology Results    MMR normal; G2 adenocarcinoma;proximal & distal margins negative; soft tissue mass on pelvic sidewall + for adenocarcinoma with positive margin MSI Stable      03/08/2016 Imaging    CT chest negative for metastasis.       03/19/2016 - 04/25/2016 Radiation Therapy    Adjuvant irradiation, 50 gray in 28 fractions      03/19/2016 - 04/22/2016 Chemotherapy    Xeloda 1500 mg twice daily, started on  03/19/2016, dose reduced to 1000 mg twice daily from week 3 due to neutropenia, and patient stopped 3 days before last dose radiation due to difficulty swallowing the pill       05/20/2016 -  Adjuvant Chemotherapy    Patient declined adjuvant chemotherapy      09/16/2016 Imaging    CT CAP w Contrast 1. No evidence of local tumor recurrence at the ileocolic anastomosis. 2. No findings suspicious for metastatic disease in the chest, abdomen or pelvis. 3. Nonspecific trace free fluid in the pelvic cul-de-sac. 4. Stable solitary 3 mm right upper lobe pulmonary nodule, for which 6 month stability has been demonstrated, probably benign. 5. Additional findings include stable right posterior pericardial cyst and small calcified uterine fibroids.       HISTORY OF PRESENTING ILLNESS:  Brittney Spencer 66 y.o. female is here because of her newly diagnsosed ascending colon cancer. She is accompanied by her daughter to my clinic today.   She initially presented with vaginal discharge and gastric discomfot., and was seen by her GYN and underwent CT scan which showedA perforated appendicitis. She was treated nonoperatively with IV antibiotics with resolution of her symptoms. Posttreatment CT scan questioned  Some thickening of the cecum, question neoplasm. Colonoscopy was recommended and was delayed somewhat due to patient issues and revealed an obvious malignancy in the cecum  with partial obstruction. Biopsy revealed adenocarcinoma. She was referred to Dr. [name] and underwent laparoscopic-assisted and possible open right hemicolectomy on 02/07/2016. Per the OR note, at laparoscopy there was a bulky somewhat fixed tumor in the cecum. There was found to be a perforated cancer with a chronic cavity invading into the posterior lateral retroperitoneum.  The cancer was very bulky and locally invasive but was grossly completely removed  With ileotransverse colostomy. Final pathology revealed a 5 cm tumor  With  microscopically broadly positive lateral margin in the retroperitoneum (grossly negative by inspection of the time of surgery) and 2 out of 42 lymph nodes positive.Dr. [name] marked the retroperitoneum with clips for possible radiation therapy. Postoperative course was unremarkable.   She has been recovering well from surgery. She has minimum pain at the incision site, staples removed, no infection or other complications. Her appetite and energy level have much improved, although not back to normal yet. She lost about 10 lbs in the past 5 month, but gained a few lbs lately.   CURRENT THERAPY: Surveillance   INTERIM HISTORY:  Ms. marzan returns for follow up. She presents to the clinic today reporting she is feeling very well and energized. She is eating not as much as she would like to but she is better. She only eats very little red meat. Her weight is overall stable. She does not eat a lot of carbohydrates. She does not eat much meat. She does not eat fish other either.  She does not like contrast, and the thought of taking it. Last time she had contrast and they started the IV he breathing got rapid and warmness. This lasted until the scan was finished. She has no other concerns. She denies pain, nausea or discomfort.  She still lives in [place]:  Past Medical History:  Diagnosis Date  . AAA (abdominal aortic aneurysm) ([facility])    infrarenal 4.1 cmper s-9-19 scan on chart  . Anemia   . Anxiety   . Colon cancer ([place])   . Hypertension   . Vitamin D deficiency     SURGICAL HISTORY: Past Surgical History:  Procedure Laterality Date  . COLONSCOPY  12/2015  . LAPAROSCOPIC RIGHT HEMI COLECTOMY Right 02/07/2016   Procedure: LAPAROSCOPIC ASSISTED RIGHT HEMI COLECTOMY AND RIGHT SALPINGO OOPHERECTOMY;  Surgeon: [name], MD;  Location: [facility];  Service: General;  Laterality: Right;    SOCIAL HISTORY: Social History   Social History  . Marital status:  Widowed    Spouse name: N/A  . Number of children: N/A  . Years of education: N/A   Occupational History  . Not on file.   Social History Main Topics  . Smoking status: Never Smoker  . Smokeless tobacco: Never Used  . Alcohol use No  . Drug use: No  . Sexual activity: Yes    Birth control/ protection: None   Other Topics Concern  . Not on file   Social History Narrative  . No narrative on file   She is retired  She has one daughter   FAMILY HISTORY: Family History  Problem Relation Age of Onset  . Cancer Mother        lung cancer  . Cancer Maternal Grandfather        unknown cancer   . Colon cancer Neg Hx     ALLERGIES:  is allergic to fish allergy; peanut-containing drug products; soy allergy; and buspirone.  MEDICATIONS:  Current Outpatient Prescriptions  Medication  Sig Dispense Refill  . acetaminophen (TYLENOL) 325 MG tablet Take 2 tablets (650 mg total) by mouth every 6 (six) hours as needed for mild pain, moderate pain, fever or headache.    . ALPRAZolam (XANAX) 0.25 MG tablet Take 0.25 mg by mouth daily as needed for anxiety.   3  . NORVASC 2.5 MG tablet Take 1 tablet (2.5 mg total) by mouth daily. 90 tablet 1  . HYDROcodone-acetaminophen (NORCO/VICODIN) 5-325 MG tablet Take 1-2 tablets by mouth every 4 (four) hours as needed for moderate pain or severe pain. (Patient not taking: Reported on 05/28/2016) 30 tablet 0  . ondansetron (ZOFRAN) 4 MG tablet Take 1 tablet (4 mg total) by mouth every 6 (six) hours as needed for nausea. (Patient not taking: Reported on 05/28/2016) 10 tablet 0  . oxyCODONE-acetaminophen (PERCOCET/ROXICET) 5-325 MG tablet Take 1-2 tablets by mouth every 4 (four) hours as needed for moderate pain. (Patient not taking: Reported on 05/28/2016) 40 tablet 0  . polyethylene glycol (MIRALAX / GLYCOLAX) packet 1 dose daily, you can buy this as any pharmacy without a prescription. (Patient not taking: Reported on 09/23/2016) 14 each 0  . Probiotic  Product ([employer] PROBIOTIC PO) Take 1 capsule by mouth as directed. Takes  3 - 4 times per week per pt.     No current facility-administered medications for this visit.     REVIEW OF SYSTEMS:   Constitutional: Denies fevers, chills or abnormal night sweats  Eyes: Denies blurriness of vision, double vision or watery eyes Ears, nose, mouth, throat, and face: Denies mucositis or sore throat Respiratory: Denies cough, dyspnea or wheezes Cardiovascular: Denies palpitation, chest discomfort or lower extremity swelling Gastrointestinal:  Denies nausea, heartburn or change in bowel habits Skin: Denies abnormal skin rashes Lymphatics: Denies new lymphadenopathy or easy bruising Neurological:Denies numbness, tingling or new weaknesses Behavioral/Psych: Mood is stable, no new changes  All other systems were reviewed with the patient and are negative.  PHYSICAL EXAMINATION: ECOG PERFORMANCE STATUS: 1  Vitals:   01/20/17 1319  BP: 136/65  Pulse: 69  Resp: 18  Temp: 98.7 F (37.1 C)   Filed Weights   01/20/17 1319  Weight: 123 lb 9.6 oz (56.1 kg)     GENERAL:alert, no distress and comfortable SKIN: skin color, texture, turgor are normal, no rashes or significant lesions EYES: normal, conjunctiva are pink and non-injected, sclera clear OROPHARYNX:no exudate, no erythema and lips, buccal mucosa, and tongue normal  NECK: supple, thyroid normal size, non-tender, without nodularity LYMPH:  no palpable lymphadenopathy in the cervical, axillary or inguinal LUNGS: clear to auscultation and percussion with normal breathing effort HEART: regular rate & rhythm and no murmurs and no lower extremity edema ABDOMEN:abdomen soft, non-tender and normal bowel sounds, surgical wound has healed well. Musculoskeletal:no cyanosis of digits and no clubbing  PSYCH: alert & oriented x 3 with fluent speech NEURO: no focal motor/sensory deficits Breasts: Breast inspection showed them to be symmetrical with no  nipple discharge. Palpation of the breasts and axilla revealed no obvious mass that I could appreciate.   LABORATORY DATA:   I have reviewed the data as listed CBC Latest Ref Rng & Units 01/20/2017 09/16/2016 07/22/2016  WBC 3.9 - 10.3 10e3/uL 2.1(L) 2.2(L) 2.5(L)  Hemoglobin 11.6 - 15.9 g/dL 10.7(L) 10.5(L) 9.9(L)  Hematocrit 34.8 - 46.6 % 33.9(L) 33.4(L) 31.1(L)  Platelets 145 - 400 10e3/uL 150 159 163   CMP Latest Ref Rng & Units 01/20/2017 09/16/2016 07/22/2016  Glucose 70 - 140 mg/dl 88 89  107  BUN 7.0 - 26.0 mg/dL 13.6 19.3 15.1  Creatinine 0.6 - 1.1 mg/dL 0.8 0.9 0.8  Sodium 136 - 145 mEq/L 140 140 139  Potassium 3.5 - 5.1 mEq/L 5.0 5.1 3.9  Chloride 101 - 111 mmol/L - - -  CO2 22 - 29 mEq/L '26 27 26  ' Calcium 8.4 - 10.4 mg/dL 9.4 9.6 9.3  Total Protein 6.4 - 8.3 g/dL 6.8 7.5 7.3  Total Bilirubin 0.20 - 1.20 mg/dL 0.49 0.33 0.46  Alkaline Phos 40 - 150 U/L 65 78 68  AST 5 - 34 U/L '16 18 21  ' ALT 0 - 55 U/L '6 12 12   ' ANC 0.8K today    Results for SONNET, RIZOR (MRN [id number]) as of 01/20/2017 13:52  Ref. Range 04/23/2016 13:27 07/22/2016 12:20 09/16/2016 15:17  CEA ([facility]) Latest Ref Range: 0.00 - 5.00 ng/mL 1.40 1.42 1.45   PENDING 01/20/17   Pathology report  Diagnosis 02/07/2016 1. Colon, segmental resection for tumor, right - INVASIVE MODERATELY DIFFERENTIATED ADENOCARCINOMA, SPANNING 5 CM IN GREATEST DIMENSION. - TUMOR ARISES FROM THE CECUM AND INVADES THROUGH THE FULL THICKNESS OF THE BOWEL AND PERFORATES THE SEROSA. - PROXIMAL AND DISTAL MARGINS ARE NEGATIVE (PLEASE SEE SPECIMEN #2 FOR FINAL MARGIN STATUS). - TWO OF FORTY-THREE LYMPH NODES POSITIVE FOR METASTATIC COLORECTAL ADENOCARCINOMA (2/43). - SEE ONCOLOGY TEMPLATE. 2. Soft tissue mass, simple excision, pelvic side wall - POSITIVE FOR COLORECTAL ADENOCARCINOMA. - MARGIN IS BROADLY POSITIVE. 3. Adnexa - ovary +/- tube, neoplastic, right - OVARIAN FIBROMA WITH ASSOCIATED CALCIFICATIONS. - BENIGN  FALLOPIAN TUBE - NO ATYPIA OR MALIGNANCY IDENTIFIED. - SEE COMMENT. Microscopic Comment 1. COLON AND RECTUM (INCLUDING TRANS-ANAL RESECTION): Specimen: Right partial colon with additional right pelvic sidewall tissue. Procedure: Right hemicolectomy with lateral pelvic sidewall excision. Tumor site: Cecum. Specimen integrity: Intact. Macroscopic intactness of mesorectum: Not applicable. Macroscopic tumor perforation: Yes, adenocarcinoma macroscopically perforates serosa. Invasive tumor: Maximum size: 5 cm. Histologic type(s): Adenocarcinoma. Histologic grade and differentiation G2: moderately differentiated/low grade. Type of polyp in which invasive carcinoma arose: A definitive precursor polyp is not identified. Microscopic extension of invasive tumor: Tumor invades through the entire colon and perforates the serosa. Lymph-Vascular invasion: Definitive lymph/vascular invasion is not identified, however there is lymph node positivity, see below Peri-neural invasion: Not identified. Tumor deposit(s) (discontinuous extramural extension): No tumor deposits are identified. Resection margins: Proximal margin: Negative. Distal margin: Negative. Circumferential (radial) (posterior ascending, posterior descending; lateral and posterior mid-rectum; and entire lower 1/3 rectum): Tumor involves pelvic sidewall margin (positive margin), see below comment. Mesenteric margin (sigmoid and transverse): Not applicable. Distance closest margin (if all above margins negative): Tumor invades into pelvic sidewall with positive pelvic sidewall margin, see below comment. Trans-anal resection margins only: Deep margin: Not applicable. Mucosal Margin: Not applicable. Distance closest mucosal margin (if negative): Not applicable. Treatment effect (neo-adjuvant therapy): Not applicable. Additional polyp(s): The possible mucosal polyp seen grossly simply demonstrates benign polypoid colorectal mucosa without  a true polyp identified. Non-neoplastic findings: No additional significant non-neoplastic findings. Lymph nodes: number examined 43; number positive: 2. Pathologic Staging: pT4b, pN1b, R2, see below comment. Ancillary studies: The tumor will be sent for MSI by PCR and MMR by [facility] per colorectal cancer protocol. Comments: As the tumor is perforated with invasion into the pelvic sidewall, this is assessed as representing direct extension into another structure and is therefore compatible with pT4b tumor. The margin as indicated by the surgeon of the pelvic sidewall is grossly positive on pathologic gross examination and  shows broad positivity on microscopic examination. The findings are consistent with the above assessment. The case was discussed with Dr. [name] on 02/09/2016. 3. Of note, the right fallopian tube in the third specimen demonstrates findings suggestive of previous tubal ligation. Please correlate with surgical history. (RH:kh 02-09-16) Willeen Niece MD Pathologist, Electronic Signature  1. Mismatch Repair (MMR) Protein Immunohistochemistry (IHC) IHC Expression Result: MLH1: Preserved nuclear expression (greater 50% tumor expression) MSH2: Preserved nuclear expression (greater 50% tumor expression) MSH6: Preserved nuclear expression (greater 50% tumor expression) PMS2: Preserved nuclear expression (greater 50% tumor expression) * Internal control demonstrates intact nuclear expression     RADIOGRAPHIC STUDIES: I have personally reviewed the radiological images as listed and agreed with the findings in the report.  CT CAP w Contrast 09/16/2016 IMPRESSION: 1. No evidence of local tumor recurrence at the ileocolic anastomosis. 2. No findings suspicious for metastatic disease in the chest, abdomen or pelvis. 3. Nonspecific trace free fluid in the pelvic cul-de-sac. 4. Stable solitary 3 mm right upper lobe pulmonary nodule, for which 6 month stability has been  demonstrated, probably benign. 5. Additional findings include stable right posterior pericardial cyst and small calcified uterine fibroids.  ASSESSMENT & PLAN:   66 y.o. female, with past medical history of hypertension and abdominal aorta aneurysm, presented with perforation right cecum colon cancer.  1. Cancer of ascending colon, pT4bN1bM0, stage IIIC, MSI-stable, (+) surgical margins at pelvic wall     -I previously discussed her surgical pathology report and prior CT scan findings in great details with patient and her daughter.  --Due to the locally advanced disease, perforated tumor, and positive surgical margins, she is at extremely high risk for cancer recurrence. -Her case was discussed in our tumor board, we recommend adjuvant chemotherapy and irradiation -She has completed adjuvant radiation and chemo, Xeloda dose was reduced due to her neutropenia. -She stopped the Xeloda for the last week of radiation, due to difficulty swallowing the pills. -I previously strongly recommend her to consider adjuvant chemotherapy, due to her extremely high risk of recurrence. After lengthy discussion, patient declined chemotherapy due to the concern of side effects and impact on her quality of life.  -Pt voiced good understanding of the benefit of adjuvant chemotherapy, which will reduce but not limited her risk of recurrence. She states she will not regret about her decision of not pursuing adjuvant chemotherapy. -I reviewed the CT scan form 09/16/2016 with the patient in detail. NED -she has had repeat colonoscopy in Dec 2017 which was unremarkable, next due in 3 years  -She will continue cancer surveillance. -She is clinically doing well, asymptomatic, lab reviewed, low WBC and moderate anemia, CEA has been normal post-op. No clinical high suspicion for recurrence for now. -f/u in 4 months, consider repeat CT scan in 4 months -We discussed the signs of cancer recurrence, she knows what to watch, and  will call me if she has any concerns.  2. HTN -She'll follow-up with her primary care physician and continue medication -we discussed that chemotherapy may affect her blood pressure, we'll monitor her pressures closely -I have held her Maxide due to her dizziness -I discussed that she is fine to take generic Norvasc   3. Anemia -Her anemia is related to her surgery and iron deficiency. Initial iron studies showed evidence of iron deficiency. -She previously took iron pill before, and had some constipation.   -She has not been taking her iron very often. I encouraged her to take it daily.  -Due to  her persistent anemia, I recommended IV iron; she declined.  -I encourage her to take a multi-vitamin with iron and if iron study results are low I suggest she takes iron pill -She still has mild persistent anemia, partially probably related to her radiation and chemotherapy.  4. Leukopenia -She developed mild leukopenia and neutropenia since she started chemotherapy and radiation, overall stable, but has not resolved. Possibly related to chemotherapy and radiation.  -Her WBC was normal before chemotherapy and irradiation. -Continue monitoring.   Plan -f/u in 4 months -Lab and CT scan a few days before next visit    All questions were answered. The patient knows to call the clinic with any problems, questions or concerns.  I spent 20 minutes counseling the patient face to face. The total time spent in the appointment was 25 minutes and more than 50% was on counseling.  This document serves as a record of services personally performed by [name], MD. It was created on her behalf by Joslyn Devon, a trained medical scribe. The creation of this record is based on the scribe's personal observations and the provider's statements to them. This document has been checked and approved by the attending provider.   I have reviewed the above documentation for accuracy and completeness and I agree with  the above.   [name], MD 01/20/2017

## 2017-01-20 ENCOUNTER — Telehealth: Payer: Self-pay | Admitting: Hematology

## 2017-01-20 ENCOUNTER — Encounter: Payer: Self-pay | Admitting: Hematology

## 2017-01-20 ENCOUNTER — Ambulatory Visit (HOSPITAL_BASED_OUTPATIENT_CLINIC_OR_DEPARTMENT_OTHER): Payer: Federal, State, Local not specified - PPO | Admitting: Hematology

## 2017-01-20 ENCOUNTER — Other Ambulatory Visit (HOSPITAL_BASED_OUTPATIENT_CLINIC_OR_DEPARTMENT_OTHER): Payer: Federal, State, Local not specified - PPO

## 2017-01-20 VITALS — BP 136/65 | HR 69 | Temp 98.7°F | Resp 18 | Ht 65.0 in | Wt 123.6 lb

## 2017-01-20 DIAGNOSIS — I10 Essential (primary) hypertension: Secondary | ICD-10-CM | POA: Diagnosis not present

## 2017-01-20 DIAGNOSIS — D701 Agranulocytosis secondary to cancer chemotherapy: Secondary | ICD-10-CM

## 2017-01-20 DIAGNOSIS — C182 Malignant neoplasm of ascending colon: Secondary | ICD-10-CM | POA: Diagnosis not present

## 2017-01-20 DIAGNOSIS — D500 Iron deficiency anemia secondary to blood loss (chronic): Secondary | ICD-10-CM

## 2017-01-20 DIAGNOSIS — D72819 Decreased white blood cell count, unspecified: Secondary | ICD-10-CM | POA: Diagnosis not present

## 2017-01-20 LAB — CBC & DIFF AND RETIC
BASO%: 0.5 % (ref 0.0–2.0)
Basophils Absolute: 0 10*3/uL (ref 0.0–0.1)
EOS ABS: 0.1 10*3/uL (ref 0.0–0.5)
EOS%: 4.7 % (ref 0.0–7.0)
HCT: 33.9 % — ABNORMAL LOW (ref 34.8–46.6)
HEMOGLOBIN: 10.7 g/dL — AB (ref 11.6–15.9)
Immature Retic Fract: 3.6 % (ref 1.60–10.00)
LYMPH%: 45.3 % (ref 14.0–49.7)
MCH: 27.6 pg (ref 25.1–34.0)
MCHC: 31.6 g/dL (ref 31.5–36.0)
MCV: 87.4 fL (ref 79.5–101.0)
MONO#: 0.2 10*3/uL (ref 0.1–0.9)
MONO%: 10.7 % (ref 0.0–14.0)
NEUT%: 38.8 % (ref 38.4–76.8)
NEUTROS ABS: 0.8 10*3/uL — AB (ref 1.5–6.5)
Platelets: 150 10*3/uL (ref 145–400)
RBC: 3.88 10*6/uL (ref 3.70–5.45)
RDW: 13.9 % (ref 11.2–14.5)
Retic %: 0.74 % (ref 0.70–2.10)
Retic Ct Abs: 28.71 10*3/uL — ABNORMAL LOW (ref 33.70–90.70)
WBC: 2.1 10*3/uL — AB (ref 3.9–10.3)
lymph#: 1 10*3/uL (ref 0.9–3.3)

## 2017-01-20 LAB — COMPREHENSIVE METABOLIC PANEL
ALBUMIN: 3.8 g/dL (ref 3.5–5.0)
ALK PHOS: 65 U/L (ref 40–150)
ALT: 6 U/L (ref 0–55)
AST: 16 U/L (ref 5–34)
Anion Gap: 8 mEq/L (ref 3–11)
BUN: 13.6 mg/dL (ref 7.0–26.0)
CO2: 26 mEq/L (ref 22–29)
Calcium: 9.4 mg/dL (ref 8.4–10.4)
Chloride: 107 mEq/L (ref 98–109)
Creatinine: 0.8 mg/dL (ref 0.6–1.1)
EGFR: 83 mL/min/{1.73_m2} — AB (ref >90)
Glucose: 88 mg/dl (ref 70–140)
POTASSIUM: 5 meq/L (ref 3.5–5.1)
Sodium: 140 mEq/L (ref 136–145)
Total Bilirubin: 0.49 mg/dL (ref 0.20–1.20)
Total Protein: 6.8 g/dL (ref 6.4–8.3)

## 2017-01-20 LAB — IRON AND TIBC
%SAT: 21 % (ref 21–57)
Iron: 72 ug/dL (ref 41–142)
TIBC: 344 ug/dL (ref 236–444)
UIBC: 272 ug/dL (ref 120–384)

## 2017-01-20 LAB — CEA (IN HOUSE-CHCC): CEA (CHCC-In House): 1.48 ng/mL (ref 0.00–5.00)

## 2017-01-20 LAB — FERRITIN: Ferritin: 14 ng/ml (ref 9–269)

## 2017-01-20 NOTE — Telephone Encounter (Signed)
Spoke with patient re November lab/fu. Central radiology will call re scans.

## 2017-03-07 ENCOUNTER — Ambulatory Visit (AMBULATORY_SURGERY_CENTER): Payer: Self-pay | Admitting: *Deleted

## 2017-03-07 VITALS — Ht 63.5 in | Wt 126.4 lb

## 2017-03-07 DIAGNOSIS — C182 Malignant neoplasm of ascending colon: Secondary | ICD-10-CM

## 2017-03-07 MED ORDER — NA SULFATE-K SULFATE-MG SULF 17.5-3.13-1.6 GM/177ML PO SOLN: 1.0000 | Freq: Once | ORAL | 0 refills | Status: AC

## 2017-03-07 NOTE — Progress Notes (Signed)
Denies allergies to eggs or soy products. Denies complications with sedation or anesthesia. Denies O2 use. Denies use of diet or weight loss medications.  Emmi instructions not given for colonoscopy, pt just had colonoscopy last year.

## 2017-03-12 ENCOUNTER — Telehealth: Payer: Self-pay | Admitting: Gastroenterology

## 2017-03-12 NOTE — Telephone Encounter (Signed)
Called pt and left message we had returned her call and to call our office back regarding her questions. Brittney Spencer

## 2017-03-12 NOTE — Telephone Encounter (Signed)
Pt returned call. Reviewed prep instructions with pt regarding how to mix the Miralax and answered her questions. She will call back if she has further questions. Gwyndolyn Saxon

## 2017-03-13 ENCOUNTER — Encounter: Payer: Self-pay | Admitting: Gastroenterology

## 2017-03-19 ENCOUNTER — Encounter: Payer: Self-pay | Admitting: Gastroenterology

## 2017-03-19 ENCOUNTER — Ambulatory Visit (AMBULATORY_SURGERY_CENTER): Payer: Federal, State, Local not specified - PPO | Admitting: Gastroenterology

## 2017-03-19 VITALS — BP 136/72 | HR 71 | Temp 98.2°F | Resp 11 | Ht 63.5 in | Wt 126.0 lb

## 2017-03-19 DIAGNOSIS — Z85038 Personal history of other malignant neoplasm of large intestine: Secondary | ICD-10-CM

## 2017-03-19 MED ORDER — SODIUM CHLORIDE 0.9 % IV SOLN: 500.0000 mL | INTRAVENOUS | Status: DC

## 2017-03-19 NOTE — Progress Notes (Signed)
A and O x3. Report to RN. Tolerated MAC anesthesia well.

## 2017-03-19 NOTE — Patient Instructions (Signed)
YOU HAD AN ENDOSCOPIC PROCEDURE TODAY AT [facility]:   Refer to the procedure report that was given to you for any specific questions about what was found during the examination.  If the procedure report does not answer your questions, please call your gastroenterologist to clarify.  If you requested that your care partner not be given the details of your procedure findings, then the procedure report has been included in a sealed envelope for you to review at your convenience later.  YOU SHOULD EXPECT: Some feelings of bloating in the abdomen. Passage of more gas than usual.  Walking can help get rid of the air that was put into your GI tract during the procedure and reduce the bloating. If you had a lower endoscopy (such as a colonoscopy or flexible sigmoidoscopy) you may notice spotting of blood in your stool or on the toilet paper. If you underwent a bowel prep for your procedure, you may not have a normal bowel movement for a few days.  Please Note:  You might notice some irritation and congestion in your nose or some drainage.  This is from the oxygen used during your procedure.  There is no need for concern and it should clear up in a day or so.  SYMPTOMS TO REPORT IMMEDIATELY:   Following lower endoscopy (colonoscopy or flexible sigmoidoscopy):  Excessive amounts of blood in the stool  Significant tenderness or worsening of abdominal pains  Swelling of the abdomen that is new, acute  Fever of 100F or higher    For urgent or emergent issues, a gastroenterologist can be reached at any hour by calling [phone].   DIET:  We do recommend a small meal at first, but then you may proceed to your regular diet.  Drink plenty of fluids but you should avoid alcoholic beverages for 24 hours.  ACTIVITY:  You should plan to take it easy for the rest of today and you should NOT DRIVE or use heavy machinery until tomorrow (because of the sedation medicines used during the test).     FOLLOW UP: Our staff will call the number listed on your records the next business day following your procedure to check on you and address any questions or concerns that you may have regarding the information given to you following your procedure. If we do not reach you, we will leave a message.  However, if you are feeling well and you are not experiencing any problems, there is no need to return our call.  We will assume that you have returned to your regular daily activities without incident.  If any biopsies were taken you will be contacted by phone or by letter within the next 1-3 weeks.  Please call us at [phone] if you have not heard about the biopsies in 3 weeks.    SIGNATURES/CONFIDENTIALITY: You and/or your care partner have signed paperwork which will be entered into your electronic medical record.  These signatures attest to the fact that that the information above on your After Visit Summary has been reviewed and is understood.  Full responsibility of the confidentiality of this discharge information lies with you and/or your care-partner.   Resume medications. Information given on hemorrhoids. Repeat procedure in 3 years.

## 2017-03-19 NOTE — Op Note (Signed)
[facility] [place] [facility] [place] Patient Name: Brittney Spencer Procedure Date: 03/19/2017 10:35 AM MRN: [id number] Endoscopist: Mallie Mussel L. 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 The patient's tolerance of                            previous anesthesia was also reviewed. The risks                            and benefits of the procedure and the sedation                            options and risks were discussed with the patient.                            All questions were answered, and informed consent                            was obtained. Prior Anticoagulants: The patient has                            taken no previous anticoagulant or antiplatelet                            agents. ASA Grade Assessment: II - A patient with                            mild systemic disease. After reviewing the risks                            and benefits, the patient was deemed in  satisfactory condition to undergo the procedure.                           After obtaining informed consent, the colonoscope                            was passed under direct vision. Throughout the                            procedure, the patient's blood pressure, pulse, and                            oxygen saturations were monitored continuously. The                            Model PCF-H190DL [id number])  scope was introduced                            through the anus and advanced to the the                            ileocolonic anastomosis. The colonoscopy was                            performed without difficulty. The patient tolerated                            the procedure well. The quality of the bowel                            preparation was excellent. Neo-terminal ileum were                            photographed. Scope In: 10:42:32 AM Scope Out: 10:55:20 AM Scope Withdrawal Time: 0 hours 9 minutes 59 seconds  Total Procedure Duration: 0 hours 12 minutes 48 seconds  Findings:                 The perianal exam findings include internal                            hemorrhoids that prolapse with straining, but                            require manual replacement into the anal canal                            (Grade III).                           There was evidence of a prior side-to-side                            ileo-colonic anastomosis in the proximal transverse  colon. This was patent and was characterized by                            healthy appearing mucosa.                           Internal hemorrhoids were found. The hemorrhoids                            were Grade III (internal hemorrhoids that prolapse                            but require manual reduction).                           The exam was otherwise without abnormality on                            direct and retroflexion views. Complications:            No immediate complications. Estimated Blood Loss:     Estimated blood loss: none. Impression:               - Internal hemorrhoids that prolapse with                            straining, but require manual replacement into the                            anal canal (Grade III) found on perianal exam.                           - Patent side-to-side ileo-colonic anastomosis,                            characterized by healthy  appearing mucosa.                           - Internal hemorrhoids.                           - The examination was otherwise normal on direct                            and retroflexion views.                           - No specimens collected. Recommendation:           - Patient has a contact number available for                            emergencies. The signs and symptoms of potential                            delayed complications were discussed with the  patient. Return to normal activities tomorrow.                            Written discharge instructions were provided to the                            patient.                           - Resume previous diet.                           - Continue present medications.                           - Repeat colonoscopy in 3 years for surveillance. Aubrie Lucien L. [name], MD 03/19/2017 11:03:20 AM This report has been signed electronically. CC Letter to:             [name], MD

## 2017-03-19 NOTE — Progress Notes (Signed)
Pt's states no medical or surgical changes since previsit  

## 2017-03-20 ENCOUNTER — Telehealth: Payer: Self-pay

## 2017-03-20 NOTE — Telephone Encounter (Signed)
  Follow up Call-  Call back number 03/19/2017 01/19/2016  Post procedure Call Back phone  # [phone]  Permission to leave phone message Yes Yes  Some recent data might be hidden     Patient questions:  Do you have a fever, pain , or abdominal swelling? No. Pain Score  0 *  Have you tolerated food without any problems? Yes.    Have you been able to return to your normal activities? Yes.    Do you have any questions about your discharge instructions: Diet   No. Medications  No. Follow up visit  No.  Do you have questions or concerns about your Care? No.  Actions: * If pain score is 4 or above: No action needed, pain <4.

## 2017-05-06 ENCOUNTER — Other Ambulatory Visit: Payer: Self-pay | Admitting: Hematology

## 2017-05-06 ENCOUNTER — Telehealth: Payer: Self-pay | Admitting: *Deleted

## 2017-05-06 DIAGNOSIS — I10 Essential (primary) hypertension: Secondary | ICD-10-CM

## 2017-05-06 NOTE — Telephone Encounter (Signed)
Norvasc refill sent to pt's pharmacy as requested.

## 2017-05-06 NOTE — Telephone Encounter (Signed)
"  My pharmacy has faxed refill request with no response.  I have two blood pressure pills left.  No I do not yet have a primary care physician.  I've been between appointments and it doesn't matter as they would have to get information from this office anyway."  Routing call information to collaborative nurse and provider for review.  Further patient communication through collaborative nurse.

## 2017-05-13 ENCOUNTER — Encounter (HOSPITAL_COMMUNITY): Payer: Self-pay

## 2017-05-13 ENCOUNTER — Other Ambulatory Visit: Payer: Federal, State, Local not specified - PPO

## 2017-05-13 ENCOUNTER — Ambulatory Visit (HOSPITAL_COMMUNITY)
Admission: RE | Admit: 2017-05-13 | Discharge: 2017-05-13 | Disposition: A | Discharge status: Home / Self Care | Payer: Federal, State, Local not specified - PPO | Source: Ambulatory Visit | Attending: Hematology | Admitting: Hematology

## 2017-05-13 ENCOUNTER — Other Ambulatory Visit (HOSPITAL_BASED_OUTPATIENT_CLINIC_OR_DEPARTMENT_OTHER): Payer: Federal, State, Local not specified - PPO

## 2017-05-13 DIAGNOSIS — C182 Malignant neoplasm of ascending colon: Secondary | ICD-10-CM | POA: Insufficient documentation

## 2017-05-13 DIAGNOSIS — D500 Iron deficiency anemia secondary to blood loss (chronic): Secondary | ICD-10-CM | POA: Diagnosis not present

## 2017-05-13 DIAGNOSIS — I700 Atherosclerosis of aorta: Secondary | ICD-10-CM | POA: Diagnosis not present

## 2017-05-13 DIAGNOSIS — D71 Functional disorders of polymorphonuclear neutrophils: Secondary | ICD-10-CM | POA: Insufficient documentation

## 2017-05-13 DIAGNOSIS — R918 Other nonspecific abnormal finding of lung field: Secondary | ICD-10-CM | POA: Diagnosis not present

## 2017-05-13 DIAGNOSIS — M47816 Spondylosis without myelopathy or radiculopathy, lumbar region: Secondary | ICD-10-CM | POA: Diagnosis not present

## 2017-05-13 DIAGNOSIS — M5136 Other intervertebral disc degeneration, lumbar region: Secondary | ICD-10-CM | POA: Insufficient documentation

## 2017-05-13 LAB — IRON AND TIBC
%SAT: 25 % (ref 21–57)
Iron: 93 ug/dL (ref 41–142)
TIBC: 369 ug/dL (ref 236–444)
UIBC: 275 ug/dL (ref 120–384)

## 2017-05-13 LAB — COMPREHENSIVE METABOLIC PANEL
ALT: 10 U/L (ref 0–55)
ANION GAP: 8 meq/L (ref 3–11)
AST: 19 U/L (ref 5–34)
Albumin: 4.1 g/dL (ref 3.5–5.0)
Alkaline Phosphatase: 63 U/L (ref 40–150)
BUN: 12.5 mg/dL (ref 7.0–26.0)
CHLORIDE: 104 meq/L (ref 98–109)
CO2: 28 meq/L (ref 22–29)
CREATININE: 0.9 mg/dL (ref 0.6–1.1)
Calcium: 9.4 mg/dL (ref 8.4–10.4)
EGFR: >60 mL/min/{1.73_m2} (ref >60)
GLUCOSE: 74 mg/dL (ref 70–140)
Potassium: 3.7 mEq/L (ref 3.5–5.1)
SODIUM: 140 meq/L (ref 136–145)
TOTAL PROTEIN: 7.6 g/dL (ref 6.4–8.3)
Total Bilirubin: 0.47 mg/dL (ref 0.20–1.20)

## 2017-05-13 LAB — CBC & DIFF AND RETIC
BASO%: 0.4 % (ref 0.0–2.0)
BASOS ABS: 0 10*3/uL (ref 0.0–0.1)
EOS ABS: 0.1 10*3/uL (ref 0.0–0.5)
EOS%: 2.3 % (ref 0.0–7.0)
HEMATOCRIT: 36.2 % (ref 34.8–46.6)
HEMOGLOBIN: 11.3 g/dL — AB (ref 11.6–15.9)
IMMATURE RETIC FRACT: 4.1 % (ref 1.60–10.00)
LYMPH#: 1 10*3/uL (ref 0.9–3.3)
LYMPH%: 38.8 % (ref 14.0–49.7)
MCH: 27.3 pg (ref 25.1–34.0)
MCHC: 31.2 g/dL — ABNORMAL LOW (ref 31.5–36.0)
MCV: 87.4 fL (ref 79.5–101.0)
MONO#: 0.3 10*3/uL (ref 0.1–0.9)
MONO%: 11.2 % (ref 0.0–14.0)
NEUT#: 1.2 10*3/uL — ABNORMAL LOW (ref 1.5–6.5)
NEUT%: 47.3 % (ref 38.4–76.8)
NRBC: 0 % (ref 0–0)
Platelets: 189 10*3/uL (ref 145–400)
RBC: 4.14 10*6/uL (ref 3.70–5.45)
RDW: 13.9 % (ref 11.2–14.5)
RETIC %: 1.02 % (ref 0.70–2.10)
Retic Ct Abs: 42.23 10*3/uL (ref 33.70–90.70)
WBC: 2.6 10*3/uL — ABNORMAL LOW (ref 3.9–10.3)

## 2017-05-13 LAB — CEA (IN HOUSE-CHCC): CEA (CHCC-IN HOUSE): 1.66 ng/mL (ref 0.00–5.00)

## 2017-05-13 LAB — FERRITIN: Ferritin: 19 ng/ml (ref 9–269)

## 2017-05-13 MED ORDER — IOPAMIDOL (ISOVUE-300) INJECTION 61%
100.0000 mL | Freq: Once | INTRAVENOUS | Status: AC | PRN
  Administered 2017-05-13: 100 mL via INTRAVENOUS

## 2017-05-13 MED ORDER — IOPAMIDOL (ISOVUE-300) INJECTION 61%
INTRAVENOUS | Status: AC
  Administered 2017-05-13: 100 mL via INTRAVENOUS
  Filled 2017-05-13: qty 100

## 2017-05-16 NOTE — Progress Notes (Signed)
[place]  [phone] [phone]  Clinic Follow Up Note   Patient Care Team: Patient, No Pcp Per as PCP - General (General Practice) 05/19/2017  CHIEF COMPLAINTS:  Follow up stage IIIC right colon cancer   Oncology History   Cancer of ascending colon [facility])   Staging form: Colon and Rectum, AJCC 7th Edition   - Clinical stage from 02/07/2016: Stage IIIC (T4b, N1b, M0) - Signed by [name], MD on 03/04/2016      Cancer of ascending colon ([place])   10/25/2015 Imaging    CT ABD/PELVIS:  Inflammatory changes inferior to the cecal tip appear improved, there is still irregular soft tissue thickening of the cecal tip, and there are adjacent prominent lymph nodes in the ileocolonic mesentery, measuring 13 mm on image 49 and 8 mm on image 52. In addition, there is a 2.5 x 1.8 cm nodule on image 46 which has central low density. Therefore, these findings are moderately suspicious for an underlying cecal malignancy with perforation.       01/19/2016 Procedure    COLONOSCOPY per Dr. [name]: Fungating, ulcerated mass almost obstructing mid ascending colon      02/05/2016 Tumor Marker    Patient's tumor was tested for the following markers: CEA Results of the tumor marker test revealed 5.7.      02/07/2016 Initial Diagnosis    Cancer of ascending colon ([place])      02/07/2016 Definitive Surgery    Laparoscopic assisted right hemicolectomy and right salpingo oopherectomy--Dr. [name]      02/07/2016 Pathologic Stage    p T4 N1b   2/43 nodes +      02/07/2016 Pathology Results    MMR normal; G2 adenocarcinoma;proximal & distal margins negative; soft tissue mass on pelvic sidewall + for adenocarcinoma with positive margin MSI Stable      03/08/2016 Imaging    CT chest negative for metastasis.       03/19/2016 - 04/25/2016 Radiation Therapy    Adjuvant irradiation, 50 gray in 28 fractions      03/19/2016 - 04/22/2016 Chemotherapy    Xeloda 1500 mg  twice daily, started on 03/19/2016, dose reduced to 1000 mg twice daily from week 3 due to neutropenia, and patient stopped 3 days before last dose radiation due to difficulty swallowing the pill       05/20/2016 -  Adjuvant Chemotherapy    Patient declined adjuvant chemotherapy      09/16/2016 Imaging    CT CAP w Contrast 1. No evidence of local tumor recurrence at the ileocolic anastomosis. 2. No findings suspicious for metastatic disease in the chest, abdomen or pelvis. 3. Nonspecific trace free fluid in the pelvic cul-de-sac. 4. Stable solitary 3 mm right upper lobe pulmonary nodule, for which 6 month stability has been demonstrated, probably benign. 5. Additional findings include stable right posterior pericardial cyst and small calcified uterine fibroids.      05/13/2017 Imaging    CT CAP W Contrast 05/13/17 IMPRESSION: 1. No current findings of residual or recurrent malignancy. 2. Mild prominence of stool throughout the colon. Nondistended portions of the rectum. 3. Several tiny pulmonary nodules are stable from the earliest available comparison of 03/08/2016 and probably benign, but may merit surveillance. 4. Other imaging findings of potential clinical significance: Old granulomatous disease. Aortoiliac atherosclerotic vascular disease. Lumbar spondylosis and degenerative disc disease. Stable amount of trace free pelvic fluid.       HISTORY OF PRESENTING ILLNESS:  Brittney Spencer  66 y.o. female is here because of her newly diagnosed ascending colon cancer. She is accompanied by her daughter to my clinic today.   She initially presented with vaginal discharge and gastric discomfort., and was seen by her GYN and underwent CT scan which showed perforated appendicitis. She was treated nonoperatively with IV antibiotics with resolution of her symptoms. Posttreatment CT scan questioned  Some thickening of the cecum, question neoplasm. Colonoscopy was recommended and was  delayed somewhat due to patient issues and revealed an obvious malignancy in the cecum with partial obstruction. Biopsy revealed adenocarcinoma. She was referred to Dr. [name] and underwent laparoscopic-assisted and possible open right hemicolectomy on 02/07/2016. Per the OR note, at laparoscopy there was a bulky somewhat fixed tumor in the cecum. There was found to be a perforated cancer with a chronic cavity invading into the posterior lateral retroperitoneum.  The cancer was very bulky and locally invasive but was grossly completely removed  With ileotransverse colostomy. Final pathology revealed a 5 cm tumor  With microscopically broadly positive lateral margin in the retroperitoneum (grossly negative by inspection of the time of surgery) and 2 out of 42 lymph nodes positive.Dr. [name] marked the retroperitoneum with clips for possible radiation therapy. Postoperative course was unremarkable.   She has been recovering well from surgery. She has minimum pain at the incision site, staples removed, no infection or other complications. Her appetite and energy level have much improved, although not back to normal yet. She lost about 10 lbs in the past 5 month, but gained a few lbs lately.   CURRENT THERAPY: Surveillance   INTERIM HISTORY:  Brittney Spencer returns for follow up and to review her latest CT Scan. She presents to the clinic today noting she has gained some weight. She started generic Norvasc and she thinks it has caused nausea. She will not see her new PCP until 08/2017. She would like to switch back to brand name Norvasc 2.5 mg. She denies any other issues. She saw her GI and had a Colonoscopy 2 weeks ago. She denies pain or bloating, everything has gone back to normal. She has spondylosis but no regular back pain. She has not found a physician to order her mammogram and bone density.  She has not had her flu shot but has stayed away from sick people.     MEDICAL HISTORY:  Past Medical History:   Diagnosis Date  . AAA (abdominal aortic aneurysm) ([facility])    infrarenal 4.1 cmper s-9-19 scan on chart  . Anemia   . Anxiety   . Colon cancer ([place])   . Hypertension   . Vitamin D deficiency     SURGICAL HISTORY: Past Surgical History:  Procedure Laterality Date  . COLONSCOPY  12/2015  . LAPAROSCOPIC ASSISTED RIGHT HEMI COLECTOMY AND RIGHT SALPINGO OOPHERECTOMY Right 02/07/2016   Performed by [name], MD at [facility] ORS    SOCIAL HISTORY: Social History   Socioeconomic History  . Marital status: Widowed    Spouse name: Not on file  . Number of children: Not on file  . Years of education: Not on file  . Highest education level: Not on file  Social Needs  . Financial resource strain: Not on file  . Food insecurity - worry: Not on file  . Food insecurity - inability: Not on file  . Transportation needs - medical: Not on file  . Transportation needs - non-medical: Not on file  Occupational History  . Not on file  Tobacco Use  .  Smoking status: Never Smoker  . Smokeless tobacco: Never Used  Substance and Sexual Activity  . Alcohol use: No    Alcohol/week: 0.0 oz  . Drug use: No  . Sexual activity: Yes    Birth control/protection: None  Other Topics Concern  . Not on file  Social History Narrative  . Not on file   She is retired  She has one daughter   FAMILY HISTORY: Family History  Problem Relation Age of Onset  . Cancer Mother        lung cancer  . Cancer Maternal Grandfather        unknown cancer   . Colon cancer Neg Hx   . Esophageal cancer Neg Hx   . Rectal cancer Neg Hx   . Stomach cancer Neg Hx     ALLERGIES:  is allergic to fish allergy; peanut-containing drug products; soy allergy; and buspirone.  MEDICATIONS:  Current Outpatient Medications  Medication Sig Dispense Refill  . ALPRAZolam (XANAX) 0.25 MG tablet Take 0.25 mg by mouth daily as needed for anxiety.   3  . amLODipine (NORVASC) 2.5 MG tablet Take 1 tablet (2.5 mg total) daily by  mouth. 90 tablet 1  . NORVASC 2.5 MG tablet Take 1 tablet (2.5 mg total) daily by mouth. 30 tablet 3  . polyethylene glycol (MIRALAX / GLYCOLAX) packet 1 dose daily, you can buy this as any pharmacy without a prescription. 14 each 0  . Probiotic Product ([employer] PROBIOTIC PO) Take 1 capsule by mouth as directed. Takes  3 - 4 times per week per pt.     Current Facility-Administered Medications  Medication Dose Route Frequency Provider Last Rate Last Dose  . 0.9 %  sodium chloride infusion  500 mL Intravenous Continuous Danis, Estill Cotta III, MD        REVIEW OF SYSTEMS:   Constitutional: Denies fevers, chills or abnormal night sweats (+) weight gain Eyes: Denies blurriness of vision, double vision or watery eyes Ears, nose, mouth, throat, and face: Denies mucositis or sore throat Respiratory: Denies cough, dyspnea or wheezes Cardiovascular: Denies palpitation, chest discomfort or lower extremity swelling Gastrointestinal:  Denies heartburn or change in bowel habits (+) nausea  Skin: Denies abnormal skin rashes Lymphatics: Denies new lymphadenopathy or easy bruising Neurological:Denies numbness, tingling or new weaknesses Behavioral/Psych: Mood is stable, no new changes  All other systems were reviewed with the patient and are negative.  PHYSICAL EXAMINATION: ECOG PERFORMANCE STATUS: 1  Vitals:   05/19/17 0811  BP: 138/73  Pulse: 81  Resp: 18  Temp: 98.7 F (37.1 C)  SpO2: 100%   Filed Weights   05/19/17 0811  Weight: 130 lb 8 oz (59.2 kg)     GENERAL:alert, no distress and comfortable SKIN: skin color, texture, turgor are normal, no rashes or significant lesions EYES: normal, conjunctiva are pink and non-injected, sclera clear OROPHARYNX:no exudate, no erythema and lips, buccal mucosa, and tongue normal  NECK: supple, thyroid normal size, non-tender, without nodularity LYMPH:  no palpable lymphadenopathy in the cervical, axillary or inguinal LUNGS: clear to auscultation and  percussion with normal breathing effort HEART: regular rate & rhythm and no murmurs and no lower extremity edema ABDOMEN:abdomen soft, non-tender and normal bowel sounds, surgical wound has healed well. Musculoskeletal:no cyanosis of digits and no clubbing  PSYCH: alert & oriented x 3 with fluent speech NEURO: no focal motor/sensory deficits Breasts: Breast inspection showed them to be symmetrical with no nipple discharge. Palpation of the breasts and axilla revealed  no obvious mass that I could appreciate.   LABORATORY DATA:   I have reviewed the data as listed CBC Latest Ref Rng & Units 05/13/2017 01/20/2017 09/16/2016  WBC 3.9 - 10.3 10e3/uL 2.6(L) 2.1(L) 2.2(L)  Hemoglobin 11.6 - 15.9 g/dL 11.3(L) 10.7(L) 10.5(L)  Hematocrit 34.8 - 46.6 % 36.2 33.9(L) 33.4(L)  Platelets 145 - 400 10e3/uL 189 150 159   CMP Latest Ref Rng & Units 05/13/2017 01/20/2017 09/16/2016  Glucose 70 - 140 mg/dl 74 88 89  BUN 7.0 - 26.0 mg/dL 12.5 13.6 19.3  Creatinine 0.6 - 1.1 mg/dL 0.9 0.8 0.9  Sodium 136 - 145 mEq/L 140 140 140  Potassium 3.5 - 5.1 mEq/L 3.7 5.0 5.1  Chloride 101 - 111 mmol/L - - -  CO2 22 - 29 mEq/L '28 26 27  ' Calcium 8.4 - 10.4 mg/dL 9.4 9.4 9.6  Total Protein 6.4 - 8.3 g/dL 7.6 6.8 7.5  Total Bilirubin 0.20 - 1.20 mg/dL 0.47 0.49 0.33  Alkaline Phos 40 - 150 U/L 63 65 78  AST 5 - 34 U/L '19 16 18  ' ALT 0 - 55 U/L '10 6 12     ' Results for TULANI, KIDNEY (MRN [id number]) as of 05/16/2017 13:37  Ref. Range 09/16/2016 15:17 01/20/2017 12:46 05/13/2017 12:52  CEA ([facility]) Latest Ref Range: 0.00 - 5.00 ng/mL 1.45 1.48 1.66     Pathology report  Diagnosis 02/07/2016 1. Colon, segmental resection for tumor, right - INVASIVE MODERATELY DIFFERENTIATED ADENOCARCINOMA, SPANNING 5 CM IN GREATEST DIMENSION. - TUMOR ARISES FROM THE CECUM AND INVADES THROUGH THE FULL THICKNESS OF THE BOWEL AND PERFORATES THE SEROSA. - PROXIMAL AND DISTAL MARGINS ARE NEGATIVE (PLEASE SEE SPECIMEN #2 FOR  FINAL MARGIN STATUS). - TWO OF FORTY-THREE LYMPH NODES POSITIVE FOR METASTATIC COLORECTAL ADENOCARCINOMA (2/43). - SEE ONCOLOGY TEMPLATE. 2. Soft tissue mass, simple excision, pelvic side wall - POSITIVE FOR COLORECTAL ADENOCARCINOMA. - MARGIN IS BROADLY POSITIVE. 3. Adnexa - ovary +/- tube, neoplastic, right - OVARIAN FIBROMA WITH ASSOCIATED CALCIFICATIONS. - BENIGN FALLOPIAN TUBE - NO ATYPIA OR MALIGNANCY IDENTIFIED. - SEE COMMENT. Microscopic Comment 1. COLON AND RECTUM (INCLUDING TRANS-ANAL RESECTION): Specimen: Right partial colon with additional right pelvic sidewall tissue. Procedure: Right hemicolectomy with lateral pelvic sidewall excision. Tumor site: Cecum. Specimen integrity: Intact. Macroscopic intactness of mesorectum: Not applicable. Macroscopic tumor perforation: Yes, adenocarcinoma macroscopically perforates serosa. Invasive tumor: Maximum size: 5 cm. Histologic type(s): Adenocarcinoma. Histologic grade and differentiation G2: moderately differentiated/low grade. Type of polyp in which invasive carcinoma arose: A definitive precursor polyp is not identified. Microscopic extension of invasive tumor: Tumor invades through the entire colon and perforates the serosa. Lymph-Vascular invasion: Definitive lymph/vascular invasion is not identified, however there is lymph node positivity, see below Peri-neural invasion: Not identified. Tumor deposit(s) (discontinuous extramural extension): No tumor deposits are identified. Resection margins: Proximal margin: Negative. Distal margin: Negative. Circumferential (radial) (posterior ascending, posterior descending; lateral and posterior mid-rectum; and entire lower 1/3 rectum): Tumor involves pelvic sidewall margin (positive margin), see below comment. Mesenteric margin (sigmoid and transverse): Not applicable. Distance closest margin (if all above margins negative): Tumor invades into pelvic sidewall with positive  pelvic sidewall margin, see below comment. Trans-anal resection margins only: Deep margin: Not applicable. Mucosal Margin: Not applicable. Distance closest mucosal margin (if negative): Not applicable. Treatment effect (neo-adjuvant therapy): Not applicable. Additional polyp(s): The possible mucosal polyp seen grossly simply demonstrates benign polypoid colorectal mucosa without a true polyp identified. Non-neoplastic findings: No additional significant non-neoplastic findings. Lymph nodes: number examined  43; number positive: 2. Pathologic Staging: pT4b, pN1b, R2, see below comment. Ancillary studies: The tumor will be sent for MSI by PCR and MMR by [facility] per colorectal cancer protocol. Comments: As the tumor is perforated with invasion into the pelvic sidewall, this is assessed as representing direct extension into another structure and is therefore compatible with pT4b tumor. The margin as indicated by the surgeon of the pelvic sidewall is grossly positive on pathologic gross examination and shows broad positivity on microscopic examination. The findings are consistent with the above assessment. The case was discussed with Dr. [name] on 02/09/2016. 3. Of note, the right fallopian tube in the third specimen demonstrates findings suggestive of previous tubal ligation. Please correlate with surgical history. (RH:kh 02-09-16) Willeen Niece MD Pathologist, Electronic Signature  1. Mismatch Repair (MMR) Protein Immunohistochemistry (IHC) IHC Expression Result: MLH1: Preserved nuclear expression (greater 50% tumor expression) MSH2: Preserved nuclear expression (greater 50% tumor expression) MSH6: Preserved nuclear expression (greater 50% tumor expression) PMS2: Preserved nuclear expression (greater 50% tumor expression) * Internal control demonstrates intact nuclear expression     PROCEDURES   Colonoscopy 03/19/17 IMPRESSION - Internal hemorrhoids that prolapse with straining, but  require manual replacement into the anal canal (Grade III) found on perianal exam. - Patent side-to-side ileo-colonic anastomosis, characterized by healthy appearing mucosa. - Internal hemorrhoids. - The examination was otherwise normal on direct and retroflexion views. - No specimens collected.   RADIOGRAPHIC STUDIES: I have personally reviewed the radiological images as listed and agreed with the findings in the report.   CT CAP W Contrast 05/13/17 IMPRESSION: 1. No current findings of residual or recurrent malignancy. 2. Mild prominence of stool throughout the colon. Nondistended portions of the rectum. 3. Several tiny pulmonary nodules are stable from the earliest available comparison of 03/08/2016 and probably benign, but may merit surveillance. 4. Other imaging findings of potential clinical significance: Old granulomatous disease. Aortoiliac atherosclerotic vascular disease. Lumbar spondylosis and degenerative disc disease. Stable amount of trace free pelvic fluid.   CT CAP w Contrast 09/16/2016 IMPRESSION: 1. No evidence of local tumor recurrence at the ileocolic anastomosis. 2. No findings suspicious for metastatic disease in the chest, abdomen or pelvis. 3. Nonspecific trace free fluid in the pelvic cul-de-sac. 4. Stable solitary 3 mm right upper lobe pulmonary nodule, for which 6 month stability has been demonstrated, probably benign. 5. Additional findings include stable right posterior pericardial cyst and small calcified uterine fibroids.  ASSESSMENT & PLAN:   66 y.o. female, with past medical history of hypertension and abdominal aorta aneurysm, presented with perforation right cecum colon cancer.  1. Cancer of ascending colon, pT4bN1bM0, stage IIIC, MSI-stable, (+) surgical margins at pelvic wall     -I previously discussed her surgical pathology report and prior CT scan findings in great details with patient and her daughter.  --Due to the locally advanced  disease, perforated tumor, and positive surgical margins, she is at extremely high risk for cancer recurrence. -Her case was discussed in our tumor board, we recommend adjuvant chemotherapy and irradiation -She has completed adjuvant radiation and chemo, Xeloda dose was reduced due to her neutropenia. -She stopped the Xeloda for the last week of radiation, due to difficulty swallowing the pills. -I previously strongly recommend her to consider adjuvant chemotherapy, due to her extremely high risk of recurrence. After lengthy discussion, patient declined chemotherapy due to the concern of side effects and impact on her quality of life.  -Pt voiced good understanding of the benefit of adjuvant chemotherapy,  which will reduce but not limited her risk of recurrence. She states she will not regret about her decision of not pursuing adjuvant chemotherapy. -I reviewed the CT scan form 09/16/2016 with the patient in detail. NED -she has had repeat colonoscopy in Dec 2017 which was unremarkable -She will continue cancer surveillance. -We discussed the signs of cancer recurrence, she knows what to watch, and will call me if she has any concerns. -We discussed her 11/31/18 CT Scan which shows no evidence of recurrence. She has stable small nodules in the lung, likely benign.  -She had a colonoscopy in 03/2017 by Dr. [name] which showed no evidence of recurrence.  -She has not had a flu shot but knows to stay away from those who are sick.  -She is clinically doing well, asymptomatic, lab reviewed, low WBC and mild anemia, CEA has been normal post-op. No clinical high suspicion for recurrence for now. -I encouraged her to keep up with other cancer screenings. I ordered her 2018 mammogram and bone density at [facility].  -She is almost 2 years since diagnosis. I discussed the risk of recurrence significantly decreases after the first 2-3 years.  -f/u in 4-5 months then every 6 months    2. HTN -She'll follow-up with  her primary care physician and continue medication -we discussed that chemotherapy may affect her blood pressure, we'll monitor her pressures closely -I have held her Maxide due to her dizziness -I discussed that she is fine to take generic Norvasc  -She felt nauseous with generic Norvasc, so I refilled her brand name Norvasc on 05/19/17.   3. Anemia -Her anemia is related to her surgery and iron deficiency. Initial iron studies showed evidence of iron deficiency. -She previously took iron pill before, and had some constipation.   -She has not been taking her iron very often. I encouraged her to take it daily.  -Due to her persistent anemia, I recommended IV iron; she declined.  -I encourage her to take a multi-vitamin with iron and if iron study results are low I suggest she takes iron pill -She still has mild persistent anemia, partially probably related to her radiation and chemotherapy.  4. Leukopenia -She developed mild leukopenia and neutropenia since she started chemotherapy and radiation, overall stable, but has not resolved. Possibly related to chemotherapy and radiation.  -Her WBC was normal before chemotherapy and radiation. -I encouraged her to stay away from those who are sick. Continue monitoring. If her Leukopenia continues to persists I will do lab work up.    Plan -Refill Norvasc 2.5 mg (she prefers brand, she is going to see her new PCP in Feb 2019, refill by PCP in future)  -Mammogram and DEXA within one month at [facility] and f/u in 4-5 months     All questions were answered. The patient knows to call the clinic with any problems, questions or concerns.  I spent 20 minutes counseling the patient face to face. The total time spent in the appointment was 25 minutes and more than 50% was on counseling.  This document serves as a record of services personally performed by [name], MD. It was created on her behalf by Joslyn Devon, a trained medical scribe. The  creation of this record is based on the scribe's personal observations and the provider's statements to them.    I have reviewed the above documentation for accuracy and completeness, and I agree with the above.    [name], MD 05/19/2017

## 2017-05-19 ENCOUNTER — Ambulatory Visit (HOSPITAL_BASED_OUTPATIENT_CLINIC_OR_DEPARTMENT_OTHER): Payer: Medicare Other | Admitting: Hematology

## 2017-05-19 ENCOUNTER — Encounter: Payer: Self-pay | Admitting: Hematology

## 2017-05-19 VITALS — BP 138/73 | HR 81 | Temp 98.7°F | Resp 18 | Ht 63.5 in | Wt 130.5 lb

## 2017-05-19 DIAGNOSIS — D709 Neutropenia, unspecified: Secondary | ICD-10-CM

## 2017-05-19 DIAGNOSIS — E2839 Other primary ovarian failure: Secondary | ICD-10-CM

## 2017-05-19 DIAGNOSIS — D509 Iron deficiency anemia, unspecified: Secondary | ICD-10-CM | POA: Diagnosis not present

## 2017-05-19 DIAGNOSIS — I10 Essential (primary) hypertension: Secondary | ICD-10-CM

## 2017-05-19 DIAGNOSIS — D72819 Decreased white blood cell count, unspecified: Secondary | ICD-10-CM | POA: Diagnosis not present

## 2017-05-19 DIAGNOSIS — C182 Malignant neoplasm of ascending colon: Secondary | ICD-10-CM | POA: Diagnosis present

## 2017-05-19 DIAGNOSIS — Z1231 Encounter for screening mammogram for malignant neoplasm of breast: Secondary | ICD-10-CM

## 2017-05-19 MED ORDER — NORVASC 2.5 MG PO TABS: 2.5000 mg | ORAL_TABLET | Freq: Every day | ORAL | 3 refills | Status: DC

## 2017-05-26 ENCOUNTER — Telehealth: Payer: Self-pay | Admitting: Hematology

## 2017-05-26 NOTE — Telephone Encounter (Signed)
"  Received a message with appointment information.  December 6th does not work for me.  I'll call [facility].  What's their number?"  Provided [facility] [phone].

## 2017-05-26 NOTE — Telephone Encounter (Signed)
Left message re lab/fu April 2019 and bone density/mammo 06/05/17 at [place]. Schedule mailed.

## 2017-10-03 NOTE — Progress Notes (Signed)
Brittney Spencer  [phone] Fax:([phone]) [phone]  Clinic Follow Up Note   Patient Care Team: Patient, No Pcp Per as PCP - General (General Practice) 10/06/2017  CHIEF COMPLAINTS:  Follow up stage IIIC right colon cancer   Oncology History   Cancer of ascending colon [facility])   Staging form: Colon and Rectum, AJCC 7th Edition   - Clinical stage from 02/07/2016: Stage IIIC (T4b, N1b, M0) - Signed by [name], MD on 03/04/2016      Cancer of ascending colon ([place])   10/25/2015 Imaging    CT ABD/PELVIS:  Inflammatory changes inferior to the cecal tip appear improved, there is still irregular soft tissue thickening of the cecal tip, and there are adjacent prominent lymph nodes in the ileocolonic mesentery, measuring 13 mm on image 49 and 8 mm on image 52. In addition, there is a 2.5 x 1.8 cm nodule on image 46 which has central low density. Therefore, these findings are moderately suspicious for an underlying cecal malignancy with perforation.       01/19/2016 Procedure    COLONOSCOPY per Dr. [name]: Fungating, ulcerated mass almost obstructing mid ascending colon      02/05/2016 Tumor Marker    Patient's tumor was tested for the following markers: CEA Results of the tumor marker test revealed 5.7.      02/07/2016 Initial Diagnosis    Cancer of ascending colon ([place])      02/07/2016 Definitive Surgery    Laparoscopic assisted right hemicolectomy and right salpingo oopherectomy--Dr. [name]      02/07/2016 Pathologic Stage    p T4 N1b   2/43 nodes +      02/07/2016 Pathology Results    MMR normal; G2 adenocarcinoma;proximal & distal margins negative; soft tissue mass on pelvic sidewall + for adenocarcinoma with positive margin MSI Stable      03/08/2016 Imaging    CT chest negative for metastasis.       03/19/2016 - 04/25/2016 Radiation Therapy    Adjuvant irradiation, 50 gray in 28 fractions      03/19/2016 - 04/22/2016 Chemotherapy    Xeloda 1500 mg twice  daily, started on 03/19/2016, dose reduced to 1000 mg twice daily from week 3 due to neutropenia, and patient stopped 3 days before last dose radiation due to difficulty swallowing the pill       05/20/2016 -  Adjuvant Chemotherapy    Patient declined adjuvant chemotherapy      09/16/2016 Imaging    CT CAP w Contrast 1. No evidence of local tumor recurrence at the ileocolic anastomosis. 2. No findings suspicious for metastatic disease in the chest, abdomen or pelvis. 3. Nonspecific trace free fluid in the pelvic cul-de-sac. 4. Stable solitary 3 mm right upper lobe pulmonary nodule, for which 6 month stability has been demonstrated, probably benign. 5. Additional findings include stable right posterior pericardial cyst and small calcified uterine fibroids.      05/13/2017 Imaging    CT CAP W Contrast 05/13/17 IMPRESSION: 1. No current findings of residual or recurrent malignancy. 2. Mild prominence of stool throughout the colon. Nondistended portions of the rectum. 3. Several tiny pulmonary nodules are stable from the earliest available comparison of 03/08/2016 and probably benign, but may merit surveillance. 4. Other imaging findings of potential clinical significance: Old granulomatous disease. Aortoiliac atherosclerotic vascular disease. Lumbar spondylosis and degenerative disc disease. Stable amount of trace free pelvic fluid.       HISTORY OF PRESENTING ILLNESS:  Brittney Spencer  67 y.o. female is here because of her newly diagnosed ascending colon cancer. She is accompanied by her daughter to my clinic today.   She initially presented with vaginal discharge and gastric discomfort., and was seen by her GYN and underwent CT scan which showed perforated appendicitis. She was treated nonoperatively with IV antibiotics with resolution of her symptoms. Posttreatment CT scan questioned  Some thickening of the cecum, question neoplasm. Colonoscopy was recommended and was  delayed somewhat due to patient issues and revealed an obvious malignancy in the cecum with partial obstruction. Biopsy revealed adenocarcinoma. She was referred to Dr. [name] and underwent laparoscopic-assisted and possible open right hemicolectomy on 02/07/2016. Per the OR note, at laparoscopy there was a bulky somewhat fixed tumor in the cecum. There was found to be a perforated cancer with a chronic cavity invading into the posterior lateral retroperitoneum.  The cancer was very bulky and locally invasive but was grossly completely removed  With ileotransverse colostomy. Final pathology revealed a 5 cm tumor  With microscopically broadly positive lateral margin in the retroperitoneum (grossly negative by inspection of the time of surgery) and 2 out of 42 lymph nodes positive.Dr. [name] marked the retroperitoneum with clips for possible radiation therapy. Postoperative course was unremarkable.   She has been recovering well from surgery. She has minimum pain at the incision site, staples removed, no infection or other complications. Her appetite and energy level have much improved, although not back to normal yet. She lost about 10 lbs in the past 5 month, but gained a few lbs lately.   CURRENT THERAPY: Surveillance  INTERIM HISTORY:  Brittney Spencer returns for follow up of her colon cancer. She presents to the clinic today by herself. She reports she is doing well overall. She endorses a good appetite and a good energy level. She stays very active.   On review of systems, pt denies abdominal bloating, abdominal pain, chest discomfort, change in bowel movements, or any other complaints at this time. Pertinent positives are listed and detailed within the above HPI.    MEDICAL HISTORY:  Past Medical History:  Diagnosis Date  . AAA (abdominal aortic aneurysm) ([facility])    infrarenal 4.1 cmper s-9-19 scan on chart  . Anemia   . Anxiety   . Colon cancer ([place])   . Hypertension   . Vitamin D  deficiency     SURGICAL HISTORY: Past Surgical History:  Procedure Laterality Date  . COLONSCOPY  12/2015  . LAPAROSCOPIC RIGHT HEMI COLECTOMY Right 02/07/2016   Procedure: LAPAROSCOPIC ASSISTED RIGHT HEMI COLECTOMY AND RIGHT SALPINGO OOPHERECTOMY;  Surgeon: [name], MD;  Location: [facility];  Service: General;  Laterality: Right;    SOCIAL HISTORY: Social History   Socioeconomic History  . Marital status: Widowed    Spouse name: Not on file  . Number of children: Not on file  . Years of education: Not on file  . Highest education level: Not on file  Occupational History  . Not on file  Social Needs  . Financial resource strain: Not on file  . Food insecurity:    Worry: Not on file    Inability: Not on file  . Transportation needs:    Medical: Not on file    Non-medical: Not on file  Tobacco Use  . Smoking status: Never Smoker  . Smokeless tobacco: Never Used  Substance and Sexual Activity  . Alcohol use: No    Alcohol/week: 0.0 oz  . Drug use: No  . Sexual  activity: Yes    Birth control/protection: None  Lifestyle  . Physical activity:    Days per week: Not on file    Minutes per session: Not on file  . Stress: Not on file  Relationships  . Social connections:    Talks on phone: Not on file    Gets together: Not on file    Attends religious service: Not on file    Active member of club or organization: Not on file    Attends meetings of clubs or organizations: Not on file    Relationship status: Not on file  . Intimate partner violence:    Fear of current or ex partner: Not on file    Emotionally abused: Not on file    Physically abused: Not on file    Forced sexual activity: Not on file  Other Topics Concern  . Not on file  Social History Narrative  . Not on file   She is retired  She has one daughter   FAMILY HISTORY: Family History  Problem Relation Age of Onset  . Cancer Mother        lung cancer  . Cancer Maternal Grandfather         unknown cancer   . Colon cancer Neg Hx   . Esophageal cancer Neg Hx   . Rectal cancer Neg Hx   . Stomach cancer Neg Hx     ALLERGIES:  is allergic to fish allergy; peanut-containing drug products; soy allergy; and buspirone.  MEDICATIONS:  Current Outpatient Medications  Medication Sig Dispense Refill  . ALPRAZolam (XANAX) 0.25 MG tablet Take 1 tablet (0.25 mg total) by mouth daily as needed for anxiety. 30 tablet 0  . NORVASC 2.5 MG tablet Take 1 tablet (2.5 mg total) daily by mouth. 30 tablet 3  . polyethylene glycol (MIRALAX / GLYCOLAX) packet 1 dose daily, you can buy this as any pharmacy without a prescription. 14 each 0   Current Facility-Administered Medications  Medication Dose Route Frequency Provider Last Rate Last Dose  . 0.9 %  sodium chloride infusion  500 mL Intravenous Continuous Danis, Estill Cotta III, MD        REVIEW OF SYSTEMS:   Constitutional: Denies fevers, chills or abnormal night sweats (+) weight gain, good appetite (+) good energy Eyes: Denies blurriness of vision, double vision or watery eyes Ears, nose, mouth, throat, and face: Denies mucositis or sore throat Respiratory: Denies cough, dyspnea or wheezes Cardiovascular: Denies palpitation, chest discomfort or lower extremity swelling Gastrointestinal:  Denies heartburn or change in bowel habits  Skin: Denies abnormal skin rashes Lymphatics: Denies new lymphadenopathy or easy bruising Neurological:Denies numbness, tingling or new weaknesses Behavioral/Psych: Mood is stable, no new changes  All other systems were reviewed with the patient and are negative.  PHYSICAL EXAMINATION: ECOG PERFORMANCE STATUS: 0  Vitals:   10/06/17 1311  BP: 135/79  Pulse: 80  Resp: 18  Temp: 98.5 F (36.9 C)  SpO2: 100%   Filed Weights   10/06/17 1311  Weight: 138 lb 8 oz (62.8 kg)     GENERAL:alert, no distress and comfortable SKIN: skin color, texture, turgor are normal, no rashes or significant lesions EYES:  normal, conjunctiva are pink and non-injected, sclera clear OROPHARYNX:no exudate, no erythema and lips, buccal mucosa, and tongue normal  NECK: supple, thyroid normal size, non-tender, without nodularity LYMPH:  no palpable lymphadenopathy in the cervical, axillary or inguinal LUNGS: clear to auscultation and percussion with normal breathing effort HEART: regular rate &  rhythm and no murmurs and no lower extremity edema ABDOMEN:abdomen soft, non-tender and normal bowel sounds, surgical wound has healed well. No organomegaly. Musculoskeletal:no cyanosis of digits and no clubbing  PSYCH: alert & oriented x 3 with fluent speech NEURO: no focal motor/sensory deficits Breasts: Breast inspection showed them to be symmetrical with no nipple discharge. Palpation of the breasts and axilla revealed no obvious mass that I could appreciate.   LABORATORY DATA:   I have reviewed the data as listed CBC Latest Ref Rng & Units 10/06/2017 05/13/2017 01/20/2017  WBC 3.9 - 10.3 K/uL 2.6(L) 2.6(L) 2.1(L)  Hemoglobin 11.6 - 15.9 g/dL - 11.3(L) 10.7(L)  Hematocrit 34.8 - 46.6 % 33.1(L) 36.2 33.9(L)  Platelets 145 - 400 K/uL 155 189 150   CMP Latest Ref Rng & Units 10/06/2017 05/13/2017 01/20/2017  Glucose 70 - 140 mg/dL 82 74 88  BUN 7 - 26 mg/dL 13 12.5 13.6  Creatinine 0.60 - 1.10 mg/dL 0.82 0.9 0.8  Sodium 136 - 145 mmol/L 140 140 140  Potassium 3.5 - 5.1 mmol/L 4.3 3.7 5.0  Chloride 98 - 109 mmol/L 106 - -  CO2 22 - 29 mmol/L '27 28 26  ' Calcium 8.4 - 10.4 mg/dL 9.4 9.4 9.4  Total Protein 6.4 - 8.3 g/dL 6.7 7.6 6.8  Total Bilirubin 0.2 - 1.2 mg/dL 0.5 0.47 0.49  Alkaline Phos 40 - 150 U/L 64 63 65  AST 5 - 34 U/L '20 19 16  ' ALT 0 - 55 U/L '11 10 6     ' Results for JAZMYNN, PHO (MRN [id number]) as of 05/16/2017 13:37  Ref. Range 09/16/2016 15:17 01/20/2017 12:46 05/13/2017 12:52  CEA ([facility]) Latest Ref Range: 0.00 - 5.00 ng/mL 1.45 1.48 1.66     Pathology report  Diagnosis 02/07/2016 1.  Colon, segmental resection for tumor, right - INVASIVE MODERATELY DIFFERENTIATED ADENOCARCINOMA, SPANNING 5 CM IN GREATEST DIMENSION. - TUMOR ARISES FROM THE CECUM AND INVADES THROUGH THE FULL THICKNESS OF THE BOWEL AND PERFORATES THE SEROSA. - PROXIMAL AND DISTAL MARGINS ARE NEGATIVE (PLEASE SEE SPECIMEN #2 FOR FINAL MARGIN STATUS). - TWO OF FORTY-THREE LYMPH NODES POSITIVE FOR METASTATIC COLORECTAL ADENOCARCINOMA (2/43). - SEE ONCOLOGY TEMPLATE. 2. Soft tissue mass, simple excision, pelvic side wall - POSITIVE FOR COLORECTAL ADENOCARCINOMA. - MARGIN IS BROADLY POSITIVE. 3. Adnexa - ovary +/- tube, neoplastic, right - OVARIAN FIBROMA WITH ASSOCIATED CALCIFICATIONS. - BENIGN FALLOPIAN TUBE - NO ATYPIA OR MALIGNANCY IDENTIFIED. - SEE COMMENT. Microscopic Comment 1. COLON AND RECTUM (INCLUDING TRANS-ANAL RESECTION): Specimen: Right partial colon with additional right pelvic sidewall tissue. Procedure: Right hemicolectomy with lateral pelvic sidewall excision. Tumor site: Cecum. Specimen integrity: Intact. Macroscopic intactness of mesorectum: Not applicable. Macroscopic tumor perforation: Yes, adenocarcinoma macroscopically perforates serosa. Invasive tumor: Maximum size: 5 cm. Histologic type(s): Adenocarcinoma. Histologic grade and differentiation G2: moderately differentiated/low grade. Type of polyp in which invasive carcinoma arose: A definitive precursor polyp is not identified. Microscopic extension of invasive tumor: Tumor invades through the entire colon and perforates the serosa. Lymph-Vascular invasion: Definitive lymph/vascular invasion is not identified, however there is lymph node positivity, see below Peri-neural invasion: Not identified. Tumor deposit(s) (discontinuous extramural extension): No tumor deposits are identified. Resection margins: Proximal margin: Negative. Distal margin: Negative. Circumferential (radial) (posterior ascending, posterior descending;  lateral and posterior mid-rectum; and entire lower 1/3 rectum): Tumor involves pelvic sidewall margin (positive margin), see below comment. Mesenteric margin (sigmoid and transverse): Not applicable. Distance closest margin (if all above margins negative): Tumor invades into pelvic sidewall  with positive pelvic sidewall margin, see below comment. Trans-anal resection margins only: Deep margin: Not applicable. Mucosal Margin: Not applicable. Distance closest mucosal margin (if negative): Not applicable. Treatment effect (neo-adjuvant therapy): Not applicable. Additional polyp(s): The possible mucosal polyp seen grossly simply demonstrates benign polypoid colorectal mucosa without a true polyp identified. Non-neoplastic findings: No additional significant non-neoplastic findings. Lymph nodes: number examined 43; number positive: 2. Pathologic Staging: pT4b, pN1b, R2, see below comment. Ancillary studies: The tumor will be sent for MSI by PCR and MMR by [facility] per colorectal cancer protocol. Comments: As the tumor is perforated with invasion into the pelvic sidewall, this is assessed as representing direct extension into another structure and is therefore compatible with pT4b tumor. The margin as indicated by the surgeon of the pelvic sidewall is grossly positive on pathologic gross examination and shows broad positivity on microscopic examination. The findings are consistent with the above assessment. The case was discussed with Dr. [name] on 02/09/2016. 3. Of note, the right fallopian tube in the third specimen demonstrates findings suggestive of previous tubal ligation. Please correlate with surgical history. (RH:kh 02-09-16) Willeen Niece MD Pathologist, Electronic Signature  1. Mismatch Repair (MMR) Protein Immunohistochemistry (IHC) IHC Expression Result: MLH1: Preserved nuclear expression (greater 50% tumor expression) MSH2: Preserved nuclear expression (greater 50% tumor  expression) MSH6: Preserved nuclear expression (greater 50% tumor expression) PMS2: Preserved nuclear expression (greater 50% tumor expression) * Internal control demonstrates intact nuclear expression    PROCEDURES   Colonoscopy 03/19/17 IMPRESSION - Internal hemorrhoids that prolapse with straining, but require manual replacement into the anal canal (Grade III) found on perianal exam. - Patent side-to-side ileo-colonic anastomosis, characterized by healthy appearing mucosa. - Internal hemorrhoids. - The examination was otherwise normal on direct and retroflexion views. - No specimens collected.   RADIOGRAPHIC STUDIES: I have personally reviewed the radiological images as listed and agreed with the findings in the report.   CT CAP W Contrast 05/13/17 IMPRESSION: 1. No current findings of residual or recurrent malignancy. 2. Mild prominence of stool throughout the colon. Nondistended portions of the rectum. 3. Several tiny pulmonary nodules are stable from the earliest available comparison of 03/08/2016 and probably benign, but may merit surveillance. 4. Other imaging findings of potential clinical significance: Old granulomatous disease. Aortoiliac atherosclerotic vascular disease. Lumbar spondylosis and degenerative disc disease. Stable amount of trace free pelvic fluid.   CT CAP w Contrast 09/16/2016 IMPRESSION: 1. No evidence of local tumor recurrence at the ileocolic anastomosis. 2. No findings suspicious for metastatic disease in the chest, abdomen or pelvis. 3. Nonspecific trace free fluid in the pelvic cul-de-sac. 4. Stable solitary 3 mm right upper lobe pulmonary nodule, for which 6 month stability has been demonstrated, probably benign. 5. Additional findings include stable right posterior pericardial cyst and small calcified uterine fibroids.  ASSESSMENT & PLAN:   67 y.o. female, with past medical history of hypertension and abdominal aorta aneurysm,  presented with perforation right cecum colon cancer.  1. Cancer of ascending colon, pT4bN1bM0, stage IIIC, MSI-stable, (+) surgical margins at pelvic wall     -I previously discussed her surgical pathology report and prior CT scan findings in great details with patient and her daughter.  --Due to the locally advanced disease, perforated tumor, and positive surgical margins, she is at extremely high risk for cancer recurrence. -Her case was discussed in our tumor board previously, we recommended adjuvant chemotherapy and irradiation -She completed adjuvant radiation and chemo, Xeloda dose was reduced due to her neutropenia in  Oct 2017 -She stopped the Xeloda for the last week of radiation, due to difficulty swallowing the pills. -I previously strongly recommend her to consider adjuvant chemotherapy, due to her extremely high risk of recurrence. After lengthy discussion, patient declined chemotherapy due to the concern of side effects and impact on her quality of life.  -Pt voiced good understanding of the benefit of adjuvant chemotherapy, which will reduce but not limited her risk of recurrence. She states she will not regret about her decision of not pursuing adjuvant chemotherapy. -I previously reviewed the CT scan form 09/16/2016 with the patient in detail. NED -We previously discussed the signs of cancer recurrence, she knows what to watch, and will call me if she has any concerns. -I encouraged her to keep up with other cancer screenings. I previously ordered her 2018 mammogram and bone density at [facility]. -We previously discussed her 11/31/18 CT Scan which shows no evidence of recurrence. She has stable small nodules in the lung, likely benign.  -She had a colonoscopy in 03/2017 by Dr. [name] which showed no evidence of recurrence.  -She is clinically doing well, asymptomatic, lab reviewed, low WBC and mild anemia which is stable overall, CEA has been normal post-op. No clinical suspicion for  recurrence for now. Plan for surveillance scan at the end of the year.  -She is 2 years since diagnosis. I discussed the risk of recurrence significantly decreases after the first 2-3 years.  -She knows to call if she develops any abnormal symptoms.  We will continue surveillance. -f/u in 6 months with CT AP W Contrast a few days before   2. HTN, Anxiety -She'll follow-up with her primary care physician and continue medication -we previously discussed that chemotherapy may affect her blood pressure, we'll monitor her pressures closely -I held her Maxide due to her dizziness previously  -She felt nauseous with generic Norvasc, so I refilled her brand name Norvasc on 05/19/17.  -She uses Xanax as needed, refilled today. Last refill was 1 year ago  3. Anemia -Her anemia is related to her surgery and iron deficiency. Initial iron studies showed evidence of iron deficiency. -She previously took iron pill before, and had some constipation.   -She has not been taking her iron very often. I previously encouraged her to take it daily.  -Due to her persistent anemia, I previously recommended IV iron; she declined.  -She still has mild persistent anemia, partially probably related to her radiation and chemotherapy. -I again encouraged her to take a multivitamin/iron pill/prenatal vitamin to get some form of supplemental iron   4. Leukopenia -She developed mild leukopenia and neutropenia when she started chemotherapy and radiation, overall stable, but has not resolved. Possibly related to chemotherapy and radiation.  -Her WBC was normal before chemotherapy and radiation. -I previously encouraged her to stay away from those who are sick. Continue monitoring. If her Leukopenia continues to persist I will do lab work up.   5. Cancer Screening -I previously ordered her a screening mammogram that has not been done yet -I encouraged her to have yearly mammogram . She voiced good understanding and is  agreeable.   Plan -Mammogram soon, she will call and reschedule -Lab and CT AP W Contrast in 6 months -F/u after CT scan -refilled Xanax  -start multivitamin with iron   All questions were answered. The patient knows to call the clinic with any problems, questions or concerns.  I spent 20 minutes counseling the patient face to face. The total time  spent in the appointment was 25 minutes and more than 50% was on counseling.  This document serves as a record of services personally performed by [name], MD. It was created on her behalf by Theresia Bough, a trained medical scribe. The creation of this record is based on the scribe's personal observations and the provider's statements to them.   I have reviewed the above documentation for accuracy and completeness, and I agree with the above.    [name], MD 10/06/2017 5:31 PM

## 2017-10-06 ENCOUNTER — Encounter: Payer: Self-pay | Admitting: Hematology

## 2017-10-06 ENCOUNTER — Inpatient Hospital Stay: Payer: Federal, State, Local not specified - PPO | Attending: Hematology | Admitting: Hematology

## 2017-10-06 ENCOUNTER — Inpatient Hospital Stay: Payer: Federal, State, Local not specified - PPO

## 2017-10-06 VITALS — BP 135/79 | HR 80 | Temp 98.5°F | Resp 18 | Ht 63.5 in | Wt 138.5 lb

## 2017-10-06 DIAGNOSIS — F419 Anxiety disorder, unspecified: Secondary | ICD-10-CM | POA: Diagnosis not present

## 2017-10-06 DIAGNOSIS — D72819 Decreased white blood cell count, unspecified: Secondary | ICD-10-CM

## 2017-10-06 DIAGNOSIS — Z9221 Personal history of antineoplastic chemotherapy: Secondary | ICD-10-CM | POA: Diagnosis not present

## 2017-10-06 DIAGNOSIS — D500 Iron deficiency anemia secondary to blood loss (chronic): Secondary | ICD-10-CM

## 2017-10-06 DIAGNOSIS — I10 Essential (primary) hypertension: Secondary | ICD-10-CM | POA: Insufficient documentation

## 2017-10-06 DIAGNOSIS — C182 Malignant neoplasm of ascending colon: Secondary | ICD-10-CM

## 2017-10-06 DIAGNOSIS — D509 Iron deficiency anemia, unspecified: Secondary | ICD-10-CM | POA: Diagnosis not present

## 2017-10-06 DIAGNOSIS — R918 Other nonspecific abnormal finding of lung field: Secondary | ICD-10-CM | POA: Insufficient documentation

## 2017-10-06 DIAGNOSIS — Z923 Personal history of irradiation: Secondary | ICD-10-CM | POA: Diagnosis not present

## 2017-10-06 LAB — COMPREHENSIVE METABOLIC PANEL
ALBUMIN: 3.7 g/dL (ref 3.5–5.0)
ALK PHOS: 64 U/L (ref 40–150)
ALT: 11 U/L (ref 0–55)
AST: 20 U/L (ref 5–34)
Anion gap: 7 (ref 3–11)
BILIRUBIN TOTAL: 0.5 mg/dL (ref 0.2–1.2)
BUN: 13 mg/dL (ref 7–26)
CALCIUM: 9.4 mg/dL (ref 8.4–10.4)
CO2: 27 mmol/L (ref 22–29)
CREATININE: 0.82 mg/dL (ref 0.60–1.10)
Chloride: 106 mmol/L (ref 98–109)
GFR calc Af Amer: >60 mL/min (ref >60)
GLUCOSE: 82 mg/dL (ref 70–140)
Potassium: 4.3 mmol/L (ref 3.5–5.1)
Sodium: 140 mmol/L (ref 136–145)
TOTAL PROTEIN: 6.7 g/dL (ref 6.4–8.3)

## 2017-10-06 LAB — CBC WITH DIFFERENTIAL (CANCER CENTER ONLY)
BASOS PCT: 1 %
Basophils Absolute: 0 10*3/uL (ref 0.0–0.1)
Eosinophils Absolute: 0.1 10*3/uL (ref 0.0–0.5)
Eosinophils Relative: 4 %
HEMATOCRIT: 33.1 % — AB (ref 34.8–46.6)
HEMOGLOBIN: 10.4 g/dL — AB (ref 11.6–15.9)
LYMPHS ABS: 0.9 10*3/uL (ref 0.9–3.3)
LYMPHS PCT: 36 %
MCH: 27.5 pg (ref 25.1–34.0)
MCHC: 31.4 g/dL — AB (ref 31.5–36.0)
MCV: 87.6 fL (ref 79.5–101.0)
MONO ABS: 0.4 10*3/uL (ref 0.1–0.9)
MONOS PCT: 14 %
NEUTROS ABS: 1.2 10*3/uL — AB (ref 1.5–6.5)
NEUTROS PCT: 45 %
Platelet Count: 155 10*3/uL (ref 145–400)
RBC: 3.78 MIL/uL (ref 3.70–5.45)
RDW: 14.1 % (ref 11.2–14.5)
WBC Count: 2.6 10*3/uL — ABNORMAL LOW (ref 3.9–10.3)

## 2017-10-06 LAB — IRON AND TIBC
Iron: 91 ug/dL (ref 41–142)
Saturation Ratios: 27 % (ref 21–57)
TIBC: 333 ug/dL (ref 236–444)
UIBC: 242 ug/dL

## 2017-10-06 LAB — RETICULOCYTES
RBC.: 3.78 MIL/uL (ref 3.70–5.45)
RETIC CT PCT: 1 % (ref 0.7–2.1)
Retic Count, Absolute: 37.8 10*3/uL (ref 33.7–90.7)

## 2017-10-06 LAB — CEA (IN HOUSE-CHCC): CEA (CHCC-In House): 1.16 ng/mL (ref 0.00–5.00)

## 2017-10-06 LAB — FERRITIN: FERRITIN: 19 ng/mL (ref 9–269)

## 2017-10-06 MED ORDER — ALPRAZOLAM 0.25 MG PO TABS: 0.2500 mg | ORAL_TABLET | Freq: Every day | ORAL | 0 refills | Status: DC | PRN

## 2017-10-07 ENCOUNTER — Telehealth: Payer: Self-pay | Admitting: Emergency Medicine

## 2017-10-07 ENCOUNTER — Telehealth: Payer: Self-pay | Admitting: Hematology

## 2017-10-07 NOTE — Telephone Encounter (Signed)
TCT patient.  Gave patient her lab results.  Pt denied any questions or concerns.

## 2017-10-07 NOTE — Telephone Encounter (Signed)
Appt scheduled Calendar/letter mailed to patient per 4/8 los

## 2017-10-07 NOTE — Addendum Note (Signed)
Addended by: [name] on: 10/07/2017 03:37 PM   Modules accepted: Orders

## 2017-10-08 ENCOUNTER — Telehealth: Payer: Self-pay | Admitting: *Deleted

## 2017-10-08 NOTE — Telephone Encounter (Signed)
Spoke with pt and informed pt of lab results as per Dr. Ernestina Penna instructions.  Pt voiced understanding.

## 2017-10-08 NOTE — Telephone Encounter (Signed)
-----   Message from [name], MD sent at 10/07/2017  8:02 AM EDT ----- Please let pt know her lab results, no concerns, thanks.  [name]  10/07/2017

## 2018-03-12 ENCOUNTER — Telehealth: Payer: Self-pay | Admitting: Hematology

## 2018-03-12 NOTE — Telephone Encounter (Signed)
tried to reach regarding a voicemail I did leave a message

## 2018-03-27 ENCOUNTER — Telehealth: Payer: Self-pay

## 2018-03-27 NOTE — Telephone Encounter (Signed)
Spoke with patient per Dr. Burr Medico received lab results from [place] [facility], informed her overall stable, will see her end of October, she verbalized an understanding.

## 2018-04-06 ENCOUNTER — Other Ambulatory Visit: Payer: Federal, State, Local not specified - PPO

## 2018-04-08 ENCOUNTER — Other Ambulatory Visit: Payer: Federal, State, Local not specified - PPO

## 2018-04-08 ENCOUNTER — Ambulatory Visit: Payer: Federal, State, Local not specified - PPO | Admitting: Hematology

## 2018-04-27 ENCOUNTER — Ambulatory Visit (HOSPITAL_COMMUNITY)
Admission: RE | Admit: 2018-04-27 | Discharge: 2018-04-27 | Disposition: A | Discharge status: Home / Self Care | Payer: Federal, State, Local not specified - PPO | Source: Ambulatory Visit | Attending: Hematology | Admitting: Hematology

## 2018-04-27 ENCOUNTER — Inpatient Hospital Stay: Payer: Federal, State, Local not specified - PPO | Attending: Hematology

## 2018-04-27 DIAGNOSIS — I10 Essential (primary) hypertension: Secondary | ICD-10-CM | POA: Insufficient documentation

## 2018-04-27 DIAGNOSIS — C182 Malignant neoplasm of ascending colon: Secondary | ICD-10-CM | POA: Insufficient documentation

## 2018-04-27 DIAGNOSIS — D509 Iron deficiency anemia, unspecified: Secondary | ICD-10-CM | POA: Diagnosis not present

## 2018-04-27 DIAGNOSIS — Z79899 Other long term (current) drug therapy: Secondary | ICD-10-CM | POA: Insufficient documentation

## 2018-04-27 DIAGNOSIS — D500 Iron deficiency anemia secondary to blood loss (chronic): Secondary | ICD-10-CM

## 2018-04-27 LAB — COMPREHENSIVE METABOLIC PANEL
ALBUMIN: 4 g/dL (ref 3.5–5.0)
ALT: 10 U/L (ref 0–44)
ANION GAP: 9 (ref 5–15)
AST: 18 U/L (ref 15–41)
Alkaline Phosphatase: 62 U/L (ref 38–126)
BILIRUBIN TOTAL: 0.5 mg/dL (ref 0.3–1.2)
BUN: 15 mg/dL (ref 8–23)
CO2: 27 mmol/L (ref 22–32)
Calcium: 9.8 mg/dL (ref 8.9–10.3)
Chloride: 105 mmol/L (ref 98–111)
Creatinine, Ser: 0.84 mg/dL (ref 0.44–1.00)
GLUCOSE: 96 mg/dL (ref 70–99)
POTASSIUM: 5.2 mmol/L — AB (ref 3.5–5.1)
SODIUM: 141 mmol/L (ref 135–145)
TOTAL PROTEIN: 7.4 g/dL (ref 6.5–8.1)

## 2018-04-27 LAB — CBC WITH DIFFERENTIAL/PLATELET
ABS IMMATURE GRANULOCYTES: 0 10*3/uL (ref 0.00–0.07)
BASOS PCT: 0 %
Basophils Absolute: 0 10*3/uL (ref 0.0–0.1)
EOS ABS: 0 10*3/uL (ref 0.0–0.5)
Eosinophils Relative: 1 %
HCT: 36.3 % (ref 36.0–46.0)
Hemoglobin: 11.3 g/dL — ABNORMAL LOW (ref 12.0–15.0)
IMMATURE GRANULOCYTES: 0 %
Lymphocytes Relative: 35 %
Lymphs Abs: 1 10*3/uL (ref 0.7–4.0)
MCH: 27 pg (ref 26.0–34.0)
MCHC: 31.1 g/dL (ref 30.0–36.0)
MCV: 86.8 fL (ref 80.0–100.0)
Monocytes Absolute: 0.3 10*3/uL (ref 0.1–1.0)
Monocytes Relative: 10 %
NEUTROS ABS: 1.5 10*3/uL — AB (ref 1.7–7.7)
NRBC: 0 % (ref 0.0–0.2)
Neutrophils Relative %: 54 %
PLATELETS: 172 10*3/uL (ref 150–400)
RBC: 4.18 MIL/uL (ref 3.87–5.11)
RDW: 13.5 % (ref 11.5–15.5)
WBC: 2.8 10*3/uL — ABNORMAL LOW (ref 4.0–10.5)

## 2018-04-27 LAB — IRON AND TIBC
Iron: 69 ug/dL (ref 41–142)
Saturation Ratios: 17 % — ABNORMAL LOW (ref 21–57)
TIBC: 401 ug/dL (ref 236–444)
UIBC: 332 ug/dL

## 2018-04-27 LAB — FERRITIN: Ferritin: 11 ng/mL (ref 11–307)

## 2018-04-27 LAB — RETICULOCYTES
IMMATURE RETIC FRACT: 7.7 % (ref 2.3–15.9)
RBC.: 4.18 MIL/uL (ref 3.87–5.11)
RETIC COUNT ABSOLUTE: 40.5 10*3/uL (ref 19.0–186.0)
RETIC CT PCT: 1 % (ref 0.4–3.1)

## 2018-04-27 LAB — CEA (IN HOUSE-CHCC): CEA (CHCC-In House): 1.46 ng/mL (ref 0.00–5.00)

## 2018-04-27 MED ORDER — IOHEXOL 300 MG/ML  SOLN
100.0000 mL | Freq: Once | INTRAMUSCULAR | Status: AC | PRN
  Administered 2018-04-27: 100 mL via INTRAVENOUS

## 2018-04-27 NOTE — Progress Notes (Signed)
[place]  [phone] [phone]  Clinic Follow Up Note   Patient Care Team: Leighton Ruff, MD as PCP - General (Family Medicine) 04/29/2018  CHIEF COMPLAINTS:  Follow up stage IIIC right colon cancer   Oncology History   Cancer of ascending colon [facility])   Staging form: Colon and Rectum, AJCC 7th Edition   - Clinical stage from 02/07/2016: Stage IIIC (T4b, N1b, M0) - Signed by [name], MD on 03/04/2016      Cancer of ascending colon ([place])   10/25/2015 Imaging    CT ABD/PELVIS:  Inflammatory changes inferior to the cecal tip appear improved, there is still irregular soft tissue thickening of the cecal tip, and there are adjacent prominent lymph nodes in the ileocolonic mesentery, measuring 13 mm on image 49 and 8 mm on image 52. In addition, there is a 2.5 x 1.8 cm nodule on image 46 which has central low density. Therefore, these findings are moderately suspicious for an underlying cecal malignancy with perforation.     01/19/2016 Procedure    COLONOSCOPY per Dr. [name]: Fungating, ulcerated mass almost obstructing mid ascending colon    02/05/2016 Tumor Marker    Patient's tumor was tested for the following markers: CEA Results of the tumor marker test revealed 5.7.    02/07/2016 Initial Diagnosis    Cancer of ascending colon ([place])    02/07/2016 Definitive Surgery    Laparoscopic assisted right hemicolectomy and right salpingo oopherectomy--Dr. [name]    02/07/2016 Pathologic Stage    p T4 N1b   2/43 nodes +    02/07/2016 Pathology Results    MMR normal; G2 adenocarcinoma;proximal & distal margins negative; soft tissue mass on pelvic sidewall + for adenocarcinoma with positive margin MSI Stable    03/08/2016 Imaging    CT chest negative for metastasis.     03/19/2016 - 04/25/2016 Radiation Therapy    Adjuvant irradiation, 50 gray in 28 fractions    03/19/2016 - 04/22/2016 Chemotherapy    Xeloda 1500 mg twice daily, started on  03/19/2016, dose reduced to 1000 mg twice daily from week 3 due to neutropenia, and patient stopped 3 days before last dose radiation due to difficulty swallowing the pill     05/20/2016 -  Adjuvant Chemotherapy    Patient declined adjuvant chemotherapy    09/16/2016 Imaging    CT CAP w Contrast 1. No evidence of local tumor recurrence at the ileocolic anastomosis. 2. No findings suspicious for metastatic disease in the chest, abdomen or pelvis. 3. Nonspecific trace free fluid in the pelvic cul-de-sac. 4. Stable solitary 3 mm right upper lobe pulmonary nodule, for which 6 month stability has been demonstrated, probably benign. 5. Additional findings include stable right posterior pericardial cyst and small calcified uterine fibroids.    05/13/2017 Imaging    CT CAP W Contrast 05/13/17 IMPRESSION: 1. No current findings of residual or recurrent malignancy. 2. Mild prominence of stool throughout the colon. Nondistended portions of the rectum. 3. Several tiny pulmonary nodules are stable from the earliest available comparison of 03/08/2016 and probably benign, but may merit surveillance. 4. Other imaging findings of potential clinical significance: Old granulomatous disease. Aortoiliac atherosclerotic vascular disease. Lumbar spondylosis and degenerative disc disease. Stable amount of trace free pelvic fluid.    04/27/2018 Imaging    04/27/2018 CT CAP IMPRESSION: Stable exam. No evidence of recurrent or metastatic carcinoma within the chest, abdomen, or pelvis     HISTORY OF PRESENTING ILLNESS:  Brittney Spencer  67 y.o. female is here because of her newly diagnosed ascending colon cancer. She is accompanied by her daughter to my clinic today.   She initially presented with vaginal discharge and gastric discomfort., and was seen by her GYN and underwent CT scan which showed perforated appendicitis. She was treated nonoperatively with IV antibiotics with resolution of her  symptoms. Posttreatment CT scan questioned  Some thickening of the cecum, question neoplasm. Colonoscopy was recommended and was delayed somewhat due to patient issues and revealed an obvious malignancy in the cecum with partial obstruction. Biopsy revealed adenocarcinoma. She was referred to Dr. [name] and underwent laparoscopic-assisted and possible open right hemicolectomy on 02/07/2016. Per the OR note, at laparoscopy there was a bulky somewhat fixed tumor in the cecum. There was found to be a perforated cancer with a chronic cavity invading into the posterior lateral retroperitoneum.  The cancer was very bulky and locally invasive but was grossly completely removed  With ileotransverse colostomy. Final pathology revealed a 5 cm tumor  With microscopically broadly positive lateral margin in the retroperitoneum (grossly negative by inspection of the time of surgery) and 2 out of 42 lymph nodes positive.Dr. [name] marked the retroperitoneum with clips for possible radiation therapy. Postoperative course was unremarkable.   She has been recovering well from surgery. She has minimum pain at the incision site, staples removed, no infection or other complications. Her appetite and energy level have much improved, although not back to normal yet. She lost about 10 lbs in the past 5 month, but gained a few lbs lately.   CURRENT THERAPY: Surveillance  INTERIM HISTORY:  Brittney Spencer returns for follow up of her colon cancer. She was last seen by me 6 months ago. Today, she is here alone at the clinic. She denies abdominal pain or changes in BMs. She is eating well. She states that she is no longer taking potassium pills. She states that she had problems with generic norvasc, and therefore prefers the brand name.  MEDICAL HISTORY:  Past Medical History:  Diagnosis Date  . AAA (abdominal aortic aneurysm) ([facility])    infrarenal 4.1 cmper s-9-19 scan on chart  . Anemia   . Anxiety   . Colon cancer ([place])    . Hypertension   . Vitamin D deficiency     SURGICAL HISTORY: Past Surgical History:  Procedure Laterality Date  . COLONSCOPY  12/2015  . LAPAROSCOPIC RIGHT HEMI COLECTOMY Right 02/07/2016   Procedure: LAPAROSCOPIC ASSISTED RIGHT HEMI COLECTOMY AND RIGHT SALPINGO OOPHERECTOMY;  Surgeon: [name], MD;  Location: [facility];  Service: General;  Laterality: Right;    SOCIAL HISTORY: Social History   Socioeconomic History  . Marital status: Widowed    Spouse name: Not on file  . Number of children: Not on file  . Years of education: Not on file  . Highest education level: Not on file  Occupational History  . Not on file  Social Needs  . Financial resource strain: Not on file  . Food insecurity:    Worry: Not on file    Inability: Not on file  . Transportation needs:    Medical: Not on file    Non-medical: Not on file  Tobacco Use  . Smoking status: Never Smoker  . Smokeless tobacco: Never Used  Substance and Sexual Activity  . Alcohol use: No    Alcohol/week: 0.0 standard drinks  . Drug use: No  . Sexual activity: Yes    Birth control/protection: None  Lifestyle  .  Physical activity:    Days per week: Not on file    Minutes per session: Not on file  . Stress: Not on file  Relationships  . Social connections:    Talks on phone: Not on file    Gets together: Not on file    Attends religious service: Not on file    Active member of club or organization: Not on file    Attends meetings of clubs or organizations: Not on file    Relationship status: Not on file  . Intimate partner violence:    Fear of current or ex partner: Not on file    Emotionally abused: Not on file    Physically abused: Not on file    Forced sexual activity: Not on file  Other Topics Concern  . Not on file  Social History Narrative  . Not on file   She is retired  She has one daughter   FAMILY HISTORY: Family History  Problem Relation Age of Onset  . Cancer Mother        lung  cancer  . Cancer Maternal Grandfather        unknown cancer   . Colon cancer Neg Hx   . Esophageal cancer Neg Hx   . Rectal cancer Neg Hx   . Stomach cancer Neg Hx     ALLERGIES:  is allergic to fish allergy; peanut-containing drug products; soy allergy; and buspirone.  MEDICATIONS:  Current Outpatient Medications  Medication Sig Dispense Refill  . ALPRAZolam (XANAX) 0.25 MG tablet Take 1 tablet (0.25 mg total) by mouth daily as needed for anxiety. 30 tablet 0  . NORVASC 2.5 MG tablet Take 1 tablet (2.5 mg total) daily by mouth. 30 tablet 3   Current Facility-Administered Medications  Medication Dose Route Frequency Provider Last Rate Last Dose  . 0.9 %  sodium chloride infusion  500 mL Intravenous Continuous Danis, Estill Cotta III, MD        REVIEW OF SYSTEMS:   Constitutional: Denies fevers, chills or abnormal night sweats (+) weight gain, good appetite (+) good energy Eyes: Denies blurriness of vision, double vision or watery eyes Ears, nose, mouth, throat, and face: Denies mucositis or sore throat Respiratory: Denies cough, dyspnea or wheezes Cardiovascular: Denies palpitation, chest discomfort or lower extremity swelling Gastrointestinal:  Denies heartburn or change in bowel habits  Skin: Denies abnormal skin rashes Lymphatics: Denies new lymphadenopathy or easy bruising Neurological:Denies numbness, tingling or new weaknesses Behavioral/Psych: Mood is stable, no new changes  All other systems were reviewed with the patient and are negative.  PHYSICAL EXAMINATION: ECOG PERFORMANCE STATUS: 0  Vitals:   04/29/18 0814  BP: 134/88  Pulse: 78  Resp: 18  Temp: 98.6 F (37 C)  SpO2: 97%   Filed Weights   04/29/18 0814  Weight: 138 lb 4.8 oz (62.7 kg)     GENERAL:alert, no distress and comfortable SKIN: skin color, texture, turgor are normal, no rashes or significant lesions EYES: normal, conjunctiva are pink and non-injected, sclera clear OROPHARYNX:no exudate, no  erythema and lips, buccal mucosa, and tongue normal  NECK: supple, thyroid normal size, non-tender, without nodularity LYMPH:  no palpable lymphadenopathy in the cervical, axillary or inguinal LUNGS: clear to auscultation and percussion with normal breathing effort HEART: regular rate & rhythm and no murmurs and no lower extremity edema ABDOMEN:abdomen soft, non-tender and normal bowel sounds, surgical wound has healed well. No organomegaly. Musculoskeletal:no cyanosis of digits and no clubbing  PSYCH: alert & oriented  x 3 with fluent speech NEURO: no focal motor/sensory deficits Breasts: Breast inspection showed them to be symmetrical with no nipple discharge. Palpation of the breasts and axilla revealed no obvious mass that I could appreciate.   LABORATORY DATA:   I have reviewed the data as listed CBC Latest Ref Rng & Units 04/27/2018 10/06/2017 05/13/2017  WBC 4.0 - 10.5 K/uL 2.8(L) 2.6(L) 2.6(L)  Hemoglobin 12.0 - 15.0 g/dL 11.3(L) 10.4(L) 11.3(L)  Hematocrit 36.0 - 46.0 % 36.3 33.1(L) 36.2  Platelets 150 - 400 K/uL 172 155 189   CMP Latest Ref Rng & Units 04/27/2018 10/06/2017 05/13/2017  Glucose 70 - 99 mg/dL 96 82 74  BUN 8 - 23 mg/dL 15 13 12.5  Creatinine 0.44 - 1.00 mg/dL 0.84 0.82 0.9  Sodium 135 - 145 mmol/L 141 140 140  Potassium 3.5 - 5.1 mmol/L 5.2(H) 4.3 3.7  Chloride 98 - 111 mmol/L 105 106 -  CO2 22 - 32 mmol/L _0 Calcium 8.9 - 10.3 mg/dL 9.8 9.4 9.4  Total Protein 6.5 - 8.1 g/dL 7.4 6.7 7.6  Total Bilirubin 0.3 - 1.2 mg/dL 0.5 0.5 0.47  Alkaline Phos 38 - 126 U/L 62 64 63  AST 15 - 41 U/L _1 ALT 0 - 44 U/L _2 Results for LUZELENA, HEEG (MRN [id number]) as of 05/16/2017 13:37  Ref. Range 09/16/2016 15:17 01/20/2017 12:46 05/13/2017 12:52  CEA ([facility]) Latest Ref Range: 0.00 - 5.00 ng/mL 1.45 1.48 1.66  04/27/18: 1.46   Pathology report  Diagnosis 02/07/2016 1. Colon, segmental resection for tumor, right - INVASIVE MODERATELY  DIFFERENTIATED ADENOCARCINOMA, SPANNING 5 CM IN GREATEST DIMENSION. - TUMOR ARISES FROM THE CECUM AND INVADES THROUGH THE FULL THICKNESS OF THE BOWEL AND PERFORATES THE SEROSA. - PROXIMAL AND DISTAL MARGINS ARE NEGATIVE (PLEASE SEE SPECIMEN #2 FOR FINAL MARGIN STATUS). - TWO OF FORTY-THREE LYMPH NODES POSITIVE FOR METASTATIC COLORECTAL ADENOCARCINOMA (2/43). - SEE ONCOLOGY TEMPLATE. 2. Soft tissue mass, simple excision, pelvic side wall - POSITIVE FOR COLORECTAL ADENOCARCINOMA. - MARGIN IS BROADLY POSITIVE. 3. Adnexa - ovary +/- tube, neoplastic, right - OVARIAN FIBROMA WITH ASSOCIATED CALCIFICATIONS. - BENIGN FALLOPIAN TUBE - NO ATYPIA OR MALIGNANCY IDENTIFIED. - SEE COMMENT. Microscopic Comment 1. COLON AND RECTUM (INCLUDING TRANS-ANAL RESECTION): Specimen: Right partial colon with additional right pelvic sidewall tissue. Procedure: Right hemicolectomy with lateral pelvic sidewall excision. Tumor site: Cecum. Specimen integrity: Intact. Macroscopic intactness of mesorectum: Not applicable. Macroscopic tumor perforation: Yes, adenocarcinoma macroscopically perforates serosa. Invasive tumor: Maximum size: 5 cm. Histologic type(s): Adenocarcinoma. Histologic grade and differentiation G2: moderately differentiated/low grade. Type of polyp in which invasive carcinoma arose: A definitive precursor polyp is not identified. Microscopic extension of invasive tumor: Tumor invades through the entire colon and perforates the serosa. Lymph-Vascular invasion: Definitive lymph/vascular invasion is not identified, however there is lymph node positivity, see below Peri-neural invasion: Not identified. Tumor deposit(s) (discontinuous extramural extension): No tumor deposits are identified. Resection margins: Proximal margin: Negative. Distal margin: Negative. Circumferential (radial) (posterior ascending, posterior descending; lateral and posterior mid-rectum; and entire lower 1/3 rectum): Tumor  involves pelvic sidewall margin (positive margin), see below comment. Mesenteric margin (sigmoid and transverse): Not applicable. Distance closest margin (if all above margins negative): Tumor invades into pelvic sidewall with positive pelvic sidewall margin, see below comment. Trans-anal resection margins only: Deep margin: Not applicable. Mucosal Margin: Not applicable. Distance closest mucosal margin (if negative): Not applicable. Treatment effect (neo-adjuvant therapy): Not applicable.  Additional polyp(s): The possible mucosal polyp seen grossly simply demonstrates benign polypoid colorectal mucosa without a true polyp identified. Non-neoplastic findings: No additional significant non-neoplastic findings. Lymph nodes: number examined 43; number positive: 2. Pathologic Staging: pT4b, pN1b, R2, see below comment. Ancillary studies: The tumor will be sent for MSI by PCR and MMR by [facility] per colorectal cancer protocol. Comments: As the tumor is perforated with invasion into the pelvic sidewall, this is assessed as representing direct extension into another structure and is therefore compatible with pT4b tumor. The margin as indicated by the surgeon of the pelvic sidewall is grossly positive on pathologic gross examination and shows broad positivity on microscopic examination. The findings are consistent with the above assessment. The case was discussed with Dr. [name] on 02/09/2016. 3. Of note, the right fallopian tube in the third specimen demonstrates findings suggestive of previous tubal ligation. Please correlate with surgical history. (RH:kh 02-09-16) Willeen Niece MD Pathologist, Electronic Signature  1. Mismatch Repair (MMR) Protein Immunohistochemistry (IHC) IHC Expression Result: MLH1: Preserved nuclear expression (greater 50% tumor expression) MSH2: Preserved nuclear expression (greater 50% tumor expression) MSH6: Preserved nuclear expression (greater 50% tumor  expression) PMS2: Preserved nuclear expression (greater 50% tumor expression) * Internal control demonstrates intact nuclear expression    PROCEDURES   Colonoscopy 03/19/17 IMPRESSION - Internal hemorrhoids that prolapse with straining, but require manual replacement into the anal canal (Grade III) found on perianal exam. - Patent side-to-side ileo-colonic anastomosis, characterized by healthy appearing mucosa. - Internal hemorrhoids. - The examination was otherwise normal on direct and retroflexion views. - No specimens collected.   RADIOGRAPHIC STUDIES: I have personally reviewed the radiological images as listed and agreed with the findings in the report.   04/27/2018 CT CAP IMPRESSION: Stable exam. No evidence of recurrent or metastatic carcinoma within the chest, abdomen, or pelvis  CT CAP W Contrast 05/13/17 IMPRESSION: 1. No current findings of residual or recurrent malignancy. 2. Mild prominence of stool throughout the colon. Nondistended portions of the rectum. 3. Several tiny pulmonary nodules are stable from the earliest available comparison of 03/08/2016 and probably benign, but may merit surveillance. 4. Other imaging findings of potential clinical significance: Old granulomatous disease. Aortoiliac atherosclerotic vascular disease. Lumbar spondylosis and degenerative disc disease. Stable amount of trace free pelvic fluid.   CT CAP w Contrast 09/16/2016 IMPRESSION: 1. No evidence of local tumor recurrence at the ileocolic anastomosis. 2. No findings suspicious for metastatic disease in the chest, abdomen or pelvis. 3. Nonspecific trace free fluid in the pelvic cul-de-sac. 4. Stable solitary 3 mm right upper lobe pulmonary nodule, for which 6 month stability has been demonstrated, probably benign. 5. Additional findings include stable right posterior pericardial cyst and small calcified uterine fibroids.  ASSESSMENT & PLAN:   67 y.o. female, with past  medical history of hypertension and abdominal aorta aneurysm, presented with perforation right cecum colon cancer.  1. Cancer of ascending colon, pT4bN1bM0, stage IIIC, MSI-stable, (+) surgical margins at pelvic wall     -I previously discussed her surgical pathology report and prior CT scan findings in great details with patient and her daughter.  --Due to the locally advanced disease, perforated tumor, and positive surgical margins, she is at extremely high risk for cancer recurrence. -Her case was discussed in our tumor board previously, we recommended adjuvant chemotherapy and irradiation -She completed adjuvant radiation and chemo, Xeloda dose was reduced due to her neutropenia in Oct 2017 -She stopped the Xeloda for the last week of radiation, due to  difficulty swallowing the pills. -I previously strongly recommend her to consider adjuvant chemotherapy, due to her extremely high risk of recurrence. After lengthy discussion, patient declined chemotherapy due to the concern of side effects and impact on her quality of life.  -Pt voiced good understanding of the benefit of adjuvant chemotherapy, which will reduce but not limited her risk of recurrence. She states she will not regret about her decision of not pursuing adjuvant chemotherapy. -I previously reviewed the CT scan form 09/16/2016 with the patient in detail. NED -We previously discussed the signs of cancer recurrence, she knows what to watch, and will call me if she has any concerns. -I encouraged her to keep up with other cancer screenings. I previously ordered her 2018 mammogram and [facility] density at [facility] but she has not scheduled  -She had a colonoscopy in 03/2017 by Dr. [name] which showed no evidence of recurrence. Next due in 3 years  -I reviewed and discussed her recent CT scan from 04/27/2018 which showed no evidence of recurrence. -She is clinically doing well, asymptomatic, lab reviewed, CBC showed Hg 11.3 WBC 2.8. CMP is overall  WNLs. Iron studies showed Iron saturation ratio 17.  -She is 2.5 years since diagnosis. I discussed the risk of recurrence significantly decreases after the first 2-3 years. -She knows to call if she develops any abnormal symptoms.  We will continue surveillance. -f/u in 6 months    2. HTN, Anxiety -She'll follow-up with her primary care physician and continue medication -we previously discussed that chemotherapy may affect her blood pressure, we'll monitor her pressures closely -I held her Maxide due to her dizziness previously  -She felt nauseous with generic Norvasc, so I refilled her brand name Norvasc on 05/19/17.  -She uses Xanax as needed.  3. Anemia -Her anemia is related to her surgery and iron deficiency. Initial iron studies showed evidence of iron deficiency. -She previously took iron pill before, and had some constipation.   -She has not been taking her iron very often. I previously encouraged her to take it daily.  -Due to her persistent anemia, I previously recommended IV iron; she declined.  -She still has mild persistent anemia, partially probably related to her radiation and chemotherapy. -I again encouraged her to take a multivitamin/iron pill/prenatal vitamin to get some form of supplemental iron   4. Leukopenia -She developed mild leukopenia and neutropenia when she started chemotherapy and radiation, overall stable, but has not resolved. Possibly related to chemotherapy and radiation.  -Her WBC was normal before chemotherapy and radiation. -I previously encouraged her to stay away from those who are sick. Continue monitoring. If her Leukopenia continues to persist I will do lab work up.   5. Cancer Screening -I previously ordered her a screening mammogram that has not been done yet -I encouraged her to have yearly mammogram . She voiced good understanding and is agreeable.   Plan - f/u in 6 months with labs, continue cancer surveillance -I will send the note to  PCP   All questions were answered. The patient knows to call the clinic with any problems, questions or concerns.  I spent 20 minutes counseling the patient face to face. The total time spent in the appointment was 25 minutes and more than 50% was on counseling.  Dierdre Searles Dweik am acting as scribe for Dr. [name].  I have reviewed the above documentation for accuracy and completeness, and I agree with the above.      [name], MD 04/29/2018  9:22 AM

## 2018-04-28 ENCOUNTER — Other Ambulatory Visit: Payer: Self-pay

## 2018-04-28 DIAGNOSIS — C182 Malignant neoplasm of ascending colon: Secondary | ICD-10-CM

## 2018-04-29 ENCOUNTER — Encounter: Payer: Self-pay | Admitting: Hematology

## 2018-04-29 ENCOUNTER — Inpatient Hospital Stay: Payer: Federal, State, Local not specified - PPO | Admitting: Hematology

## 2018-04-29 VITALS — BP 134/88 | HR 78 | Temp 98.6°F | Resp 18 | Ht 63.5 in | Wt 138.3 lb

## 2018-04-29 DIAGNOSIS — I10 Essential (primary) hypertension: Secondary | ICD-10-CM | POA: Diagnosis not present

## 2018-04-29 DIAGNOSIS — C182 Malignant neoplasm of ascending colon: Secondary | ICD-10-CM

## 2018-04-29 DIAGNOSIS — D709 Neutropenia, unspecified: Secondary | ICD-10-CM | POA: Diagnosis not present

## 2018-04-29 DIAGNOSIS — D509 Iron deficiency anemia, unspecified: Secondary | ICD-10-CM

## 2018-04-29 DIAGNOSIS — F419 Anxiety disorder, unspecified: Secondary | ICD-10-CM

## 2018-04-30 ENCOUNTER — Telehealth: Payer: Self-pay | Admitting: Hematology

## 2018-04-30 NOTE — Telephone Encounter (Signed)
Appts scheduled letter/calendar mailed per 10/30 los

## 2018-10-14 ENCOUNTER — Telehealth: Payer: Self-pay | Admitting: Hematology

## 2018-10-14 NOTE — Telephone Encounter (Signed)
Changed 4/29 appt to telephone visit per sch msg. Called patient. No answer. Left msg explaining changes and cancelled lab appt.

## 2018-10-26 NOTE — Progress Notes (Signed)
[place]   [phone] Fax:(336) [phone]   Clinic Follow up Note   Patient Care Team: Leighton Ruff, MD as PCP - General (Family Medicine)   I connected with Collene Leyden on 10/28/2018 at  8:15 AM EDT by telephone visit and verified that I am speaking with the correct person using two identifiers.  I discussed the limitations, risks, security and privacy concerns of performing an evaluation and management service by telephone and the availability of in person appointments. I also discussed with the patient that there may be a patient responsible charge related to this service. The patient expressed understanding and agreed to proceed.   Patient's location:  Her car Provider's location:  My Office   CHIEF COMPLAINT: Follow up stage IIIC right colon cancer  SUMMARY OF ONCOLOGIC HISTORY: Oncology History   Cancer of ascending colon ([place])   Staging form: Colon and Rectum, AJCC 7th Edition   - Clinical stage from 02/07/2016: Stage IIIC (T4b, N1b, M0) - Signed by [name], MD on 03/04/2016      Cancer of ascending colon ([place])   10/25/2015 Imaging    CT ABD/PELVIS:  Inflammatory changes inferior to the cecal tip appear improved, there is still irregular soft tissue thickening of the cecal tip, and there are adjacent prominent lymph nodes in the ileocolonic mesentery, measuring 13 mm on image 49 and 8 mm on image 52. In addition, there is a 2.5 x 1.8 cm nodule on image 46 which has central low density. Therefore, these findings are moderately suspicious for an underlying cecal malignancy with perforation.     01/19/2016 Procedure    COLONOSCOPY per Dr. [name]: Fungating, ulcerated mass almost obstructing mid ascending colon    01/19/2016 Initial Biopsy    Diagnosis Surgical [P], cecal mass - INVASIVE ADENOCARCINOMA WITH ULCERATION. - SEE COMMENT.    02/05/2016 Tumor Marker    Patient's tumor was tested for the following markers: CEA Results of  the tumor marker test revealed 5.7.    02/07/2016 Initial Diagnosis    Cancer of ascending colon ([place])    02/07/2016 Definitive Surgery    Laparoscopic assisted right hemicolectomy and right salpingo oopherectomy--Dr. [name]    02/07/2016 Pathologic Stage    p T4 N1b   2/43 nodes +    02/07/2016 Pathology Results    MMR normal; G2 adenocarcinoma;proximal & distal margins negative; soft tissue mass on pelvic sidewall + for adenocarcinoma with positive margin MSI Stable    03/08/2016 Imaging    CT chest negative for metastasis.     03/19/2016 - 04/25/2016 Radiation Therapy    Adjuvant irradiation, 50 gray in 28 fractions    03/19/2016 - 04/22/2016 Chemotherapy    Xeloda 1500 mg twice daily, started on 03/19/2016, dose reduced to 1000 mg twice daily from week 3 due to neutropenia, and patient stopped 3 days before last dose radiation due to difficulty swallowing the pill     05/20/2016 -  Adjuvant Chemotherapy    Patient declined adjuvant chemotherapy    09/16/2016 Imaging    CT CAP w Contrast 1. No evidence of local tumor recurrence at the ileocolic anastomosis. 2. No findings suspicious for metastatic disease in the chest, abdomen or pelvis. 3. Nonspecific trace free fluid in the pelvic cul-de-sac. 4. Stable solitary 3 mm right upper lobe pulmonary nodule, for which 6 month stability has been demonstrated, probably benign. 5. Additional findings include stable right posterior pericardial cyst and small calcified uterine fibroids.  05/13/2017 Imaging    CT CAP W Contrast 05/13/17 IMPRESSION: 1. No current findings of residual or recurrent malignancy. 2. Mild prominence of stool throughout the colon. Nondistended portions of the rectum. 3. Several tiny pulmonary nodules are stable from the earliest available comparison of 03/08/2016 and probably benign, but may merit surveillance. 4. Other imaging findings of potential clinical significance: Old granulomatous disease.  Aortoiliac atherosclerotic vascular disease. Lumbar spondylosis and degenerative disc disease. Stable amount of trace free pelvic fluid.    04/27/2018 Imaging    04/27/2018 CT CAP IMPRESSION: Stable exam. No evidence of recurrent or metastatic carcinoma within the chest, abdomen, or pelvis      CURRENT THERAPY:  Surveillance  INTERVAL HISTORY:  Brittney ENGEBRETSON is here for a follow up of colon cancer. She was last seen by me 6 months ago. She was able to identify herself by birth date. She notes last week she started to feel nauseous with low appetite and low enjoyment of food due to taste change. She denies any vomiting during this. She has not weighted herself. She note she had not had any of these symptoms since her surgery. She feels she would prefer to be seen eventually as she is concerned about why. She feels this is not her baseline. She denies traveling. She notes having chills but temp was 98.6 and then 99. She notes her bowel movements are not at baseline but still regular with normal stool color. She attributes this to eating more white bread.  She denies any pain. She notes upper back aches from lifting. She feels her sleep is interrupted. She denies night sweats. She notes today she is not as nauseous.     REVIEW OF SYSTEMS:   Constitutional: Denies fevers, chills or abnormal weight loss (+) Taste change, low appetite (+) trouble sleeping Eyes: Denies blurriness of vision Ears, nose, mouth, throat, and face: Denies mucositis or sore throat Respiratory: Denies cough, dyspnea or wheezes Cardiovascular: Denies palpitation, chest discomfort or lower extremity swelling Gastrointestinal:  Denies heartburn or change in bowel habits (+) nausea  Skin: Denies abnormal skin rashes Lymphatics: Denies new lymphadenopathy or easy bruising Neurological:Denies numbness, tingling or new weaknesses Behavioral/Psych: Mood is stable, no new changes  All other systems were reviewed with  the patient and are negative.  MEDICAL HISTORY:  Past Medical History:  Diagnosis Date  . AAA (abdominal aortic aneurysm) ([facility])    infrarenal 4.1 cmper s-9-19 scan on chart  . Anemia   . Anxiety   . Colon cancer ([place])   . Hypertension   . Vitamin D deficiency     SURGICAL HISTORY: Past Surgical History:  Procedure Laterality Date  . COLONSCOPY  12/2015  . LAPAROSCOPIC RIGHT HEMI COLECTOMY Right 02/07/2016   Procedure: LAPAROSCOPIC ASSISTED RIGHT HEMI COLECTOMY AND RIGHT SALPINGO OOPHERECTOMY;  Surgeon: [name], MD;  Location: [facility];  Service: General;  Laterality: Right;    I have reviewed the social history and family history with the patient and they are unchanged from previous note.  ALLERGIES:  is allergic to fish allergy; peanut-containing drug products; soy allergy; and buspirone.  MEDICATIONS:  Current Outpatient Medications  Medication Sig Dispense Refill  . ALPRAZolam (XANAX) 0.25 MG tablet Take 1 tablet (0.25 mg total) by mouth daily as needed for anxiety. 30 tablet 0  . NORVASC 2.5 MG tablet Take 1 tablet (2.5 mg total) daily by mouth. 30 tablet 3   Current Facility-Administered Medications  Medication Dose Route Frequency Provider Last Rate Last  Dose  . 0.9 %  sodium chloride infusion  500 mL Intravenous Continuous Danis, Estill Cotta III, MD        PHYSICAL EXAMINATION: ECOG PERFORMANCE STATUS: 1 - Symptomatic but completely ambulatory  No vitals taken today, Exam not performed today  LABORATORY DATA:  I have reviewed the data as listed CBC Latest Ref Rng & Units 04/27/2018 10/06/2017 05/13/2017  WBC 4.0 - 10.5 K/uL 2.8(L) 2.6(L) 2.6(L)  Hemoglobin 12.0 - 15.0 g/dL 11.3(L) 10.4(L) 11.3(L)  Hematocrit 36.0 - 46.0 % 36.3 33.1(L) 36.2  Platelets 150 - 400 K/uL 172 155 189     CMP Latest Ref Rng & Units 04/27/2018 10/06/2017 05/13/2017  Glucose 70 - 99 mg/dL 96 82 74  BUN 8 - 23 mg/dL 15 13 12.5  Creatinine 0.44 - 1.00 mg/dL 0.84 0.82 0.9  Sodium 135 -  145 mmol/L 141 140 140  Potassium 3.5 - 5.1 mmol/L 5.2(H) 4.3 3.7  Chloride 98 - 111 mmol/L 105 106 -  CO2 22 - 32 mmol/L _0 Calcium 8.9 - 10.3 mg/dL 9.8 9.4 9.4  Total Protein 6.5 - 8.1 g/dL 7.4 6.7 7.6  Total Bilirubin 0.3 - 1.2 mg/dL 0.5 0.5 0.47  Alkaline Phos 38 - 126 U/L 62 64 63  AST 15 - 41 U/L _1 ALT 0 - 44 U/L _2 RADIOGRAPHIC STUDIES: I have personally reviewed the radiological images as listed and agreed with the findings in the report. No results found.   ASSESSMENT & PLAN:  MIASHA Spencer is a 68 y.o. female with   1. Cancer of ascending colon, pT4bN1bM0, stage IIIC, MSI-stable, (+) surgical margins at pelvic wall     -She was diagnosed in 12/2015. She is s/p right hemicolectomy with right salpingo oophorectomy and  adjuvant ChemoRT.  -I previously strongly recommend her to consider adjuvant chemotherapy, due to her extremely high risk of recurrence. After lengthy discussion, patient declined chemotherapy due to the concern of side effects and impact on her quality of life.  -Pt recently developed nausea and anorexia a week ago, she will check with her primary care physician first, to rule out COVID-19 and do lab work  -If her current symptoms of nausea, elevated temp, low appetite persists, I may repeat CT scan in the next 2-4 weeks  -F/u open, pt will call me after seeing her PCP    2. HTN, Anxiety -She'll follow-up with her primary care physician and continue medication -She uses Xanax as needed.  3. Low appetite, taste change, nausea  -symptoms stared one week ago and she also had 1 episode of chills with temp of 99 in the past week.  -I encouraged her to watch her temperature and symptoms.  -If symptoms not resolved in 2-3 weeks or she spikes a fever, develops a cough or SOB she should contact her PCP for workup. She will notify me of any lab work with PCP be sent to me.  -If symptoms worsen or do not improve she should also see  me for CT scan sooner  Plan -continue cancer surveillance -F/u open, pt will call me after seeing her PCP     No problem-specific Assessment & Plan notes found for this encounter.   No orders of the defined types were placed in this encounter.  I discussed the assessment and treatment plan with the patient. The patient was provided an opportunity to ask questions and all were answered. The patient  agreed with the plan and demonstrated an understanding of the instructions.  The patient was advised to call back or seek an in-person evaluation if the symptoms worsen or if the condition fails to improve as anticipated.  I provided 20 minutes of non face-to-face telephone visit time during this encounter, and > 50% was spent counseling as documented under my assessment & plan.    [name], MD 10/28/2018   I, Fonnie Birkenhead, am acting as scribe for [name], MD.   I have reviewed the above documentation for accuracy and completeness, and I agree with the above.

## 2018-10-28 ENCOUNTER — Encounter: Payer: Self-pay | Admitting: Hematology

## 2018-10-28 ENCOUNTER — Inpatient Hospital Stay: Payer: Medicare Other | Attending: Hematology | Admitting: Hematology

## 2018-10-28 ENCOUNTER — Other Ambulatory Visit: Payer: Federal, State, Local not specified - PPO

## 2018-10-28 ENCOUNTER — Telehealth: Payer: Self-pay | Admitting: Hematology

## 2018-10-28 DIAGNOSIS — Z79899 Other long term (current) drug therapy: Secondary | ICD-10-CM

## 2018-10-28 DIAGNOSIS — Z923 Personal history of irradiation: Secondary | ICD-10-CM | POA: Diagnosis not present

## 2018-10-28 DIAGNOSIS — D508 Other iron deficiency anemias: Secondary | ICD-10-CM | POA: Diagnosis not present

## 2018-10-28 DIAGNOSIS — Z9221 Personal history of antineoplastic chemotherapy: Secondary | ICD-10-CM | POA: Diagnosis not present

## 2018-10-28 DIAGNOSIS — F418 Other specified anxiety disorders: Secondary | ICD-10-CM

## 2018-10-28 DIAGNOSIS — I10 Essential (primary) hypertension: Secondary | ICD-10-CM

## 2018-10-28 DIAGNOSIS — C182 Malignant neoplasm of ascending colon: Secondary | ICD-10-CM

## 2018-10-28 DIAGNOSIS — D500 Iron deficiency anemia secondary to blood loss (chronic): Secondary | ICD-10-CM

## 2018-10-28 NOTE — Telephone Encounter (Signed)
No los per 4/29. °

## 2019-01-21 ENCOUNTER — Telehealth: Payer: Self-pay | Admitting: Hematology

## 2019-01-21 NOTE — Telephone Encounter (Signed)
Called pt per 7/22 sch message - per Dr. Burr Medico : Please schedule lab and f/u with me in 1-2 weeks, thanks   [facility] and no answer - left message for patient to call back to set up an appt.

## 2019-02-12 NOTE — Progress Notes (Signed)
[place]   Telephone:(336) [phone] Fax:(336) [phone]   Clinic Follow up Note   Patient Care Team: Leighton Ruff, MD as PCP - General (Family Medicine)  Date of Service:  02/15/2019  CHIEF COMPLAINT: Follow up stage IIIC right colon cancer  SUMMARY OF ONCOLOGIC HISTORY: Oncology History Overview Note  Cancer of ascending colon [facility])   Staging form: Colon and Rectum, AJCC 7th Edition   - Clinical stage from 02/07/2016: Stage IIIC (T4b, N1b, M0) - Signed by [name], MD on 03/04/2016    Cancer of ascending colon ([place])  10/25/2015 Imaging   CT ABD/PELVIS:  Inflammatory changes inferior to the cecal tip appear improved, there is still irregular soft tissue thickening of the cecal tip, and there are adjacent prominent lymph nodes in the ileocolonic mesentery, measuring 13 mm on image 49 and 8 mm on image 52. In addition, there is a 2.5 x 1.8 cm nodule on image 46 which has central low density. Therefore, these findings are moderately suspicious for an underlying cecal malignancy with perforation.    01/19/2016 Procedure   COLONOSCOPY per Dr. [name]: Fungating, ulcerated mass almost obstructing mid ascending colon   01/19/2016 Initial Biopsy   Diagnosis Surgical [P], cecal mass - INVASIVE ADENOCARCINOMA WITH ULCERATION. - SEE COMMENT.   02/05/2016 Tumor Marker   Patient's tumor was tested for the following markers: CEA Results of the tumor marker test revealed 5.7.   02/07/2016 Initial Diagnosis   Cancer of ascending colon ([place])   02/07/2016 Definitive Surgery   Laparoscopic assisted right hemicolectomy and right salpingo oopherectomy--Dr. [name]   02/07/2016 Pathologic Stage   p T4 N1b   2/43 nodes +   02/07/2016 Pathology Results   MMR normal; G2 adenocarcinoma;proximal & distal margins negative; soft tissue mass on pelvic sidewall + for adenocarcinoma with positive margin MSI Stable   03/08/2016 Imaging   CT chest negative for metastasis.    03/19/2016 -  04/25/2016 Radiation Therapy   Adjuvant irradiation, 50 gray in 28 fractions   03/19/2016 - 04/22/2016 Chemotherapy   Xeloda 1500 mg twice daily, started on 03/19/2016, dose reduced to 1000 mg twice daily from week 3 due to neutropenia, and patient stopped 3 days before last dose radiation due to difficulty swallowing the pill    05/20/2016 -  Adjuvant Chemotherapy   Patient declined adjuvant chemotherapy   09/16/2016 Imaging   CT CAP w Contrast 1. No evidence of local tumor recurrence at the ileocolic anastomosis. 2. No findings suspicious for metastatic disease in the chest, abdomen or pelvis. 3. Nonspecific trace free fluid in the pelvic cul-de-sac. 4. Stable solitary 3 mm right upper lobe pulmonary nodule, for which 6 month stability has been demonstrated, probably benign. 5. Additional findings include stable right posterior pericardial cyst and small calcified uterine fibroids.   05/13/2017 Imaging   CT CAP W Contrast 05/13/17 IMPRESSION: 1. No current findings of residual or recurrent malignancy. 2. Mild prominence of stool throughout the colon. Nondistended portions of the rectum. 3. Several tiny pulmonary nodules are stable from the earliest available comparison of 03/08/2016 and probably benign, but may merit surveillance. 4. Other imaging findings of potential clinical significance: Old granulomatous disease. Aortoiliac atherosclerotic vascular disease. Lumbar spondylosis and degenerative disc disease. Stable amount of trace free pelvic fluid.   04/27/2018 Imaging   04/27/2018 CT CAP IMPRESSION: Stable exam. No evidence of recurrent or metastatic carcinoma within the chest, abdomen, or pelvis      CURRENT THERAPY:  Surveillance  INTERVAL HISTORY:  Brittney Spencer is here for a follow up colon cancer. She presents to the clinic alone. She notes she talked to Dr. Drema Dallas on the phone. She notes she altered her diet such as dairy and reduced her acid intake.  She notes her taste change and nausea had resolved. She notes with reduction in her diet and being more active she has lost weight. She notes her energy level fluctuates and fair based on how much sleep she gets. She denies any GI issues currently. She denies GI bleeding.  I reviewed her medication list with her. She only takes Xanax when she comes to the doctor office.    REVIEW OF SYSTEMS:   Constitutional: Denies fevers, chills (+) weight loss Eyes: Denies blurriness of vision Ears, nose, mouth, throat, and face: Denies mucositis or sore throat Respiratory: Denies cough, dyspnea or wheezes Cardiovascular: Denies palpitation, chest discomfort or lower extremity swelling Gastrointestinal:  Denies nausea, heartburn or change in bowel habits Skin: Denies abnormal skin rashes Lymphatics: Denies new lymphadenopathy or easy bruising Neurological:Denies numbness, tingling or new weaknesses Behavioral/Psych: Mood is stable, no new changes  All other systems were reviewed with the patient and are negative.  MEDICAL HISTORY:  Past Medical History:  Diagnosis Date  . AAA (abdominal aortic aneurysm) ([facility])    infrarenal 4.1 cmper s-9-19 scan on chart  . Anemia   . Anxiety   . Colon cancer ([place])   . Hypertension   . Vitamin D deficiency     SURGICAL HISTORY: Past Surgical History:  Procedure Laterality Date  . COLONSCOPY  12/2015  . LAPAROSCOPIC RIGHT HEMI COLECTOMY Right 02/07/2016   Procedure: LAPAROSCOPIC ASSISTED RIGHT HEMI COLECTOMY AND RIGHT SALPINGO OOPHERECTOMY;  Surgeon: [name], MD;  Location: [facility];  Service: General;  Laterality: Right;    I have reviewed the social history and family history with the patient and they are unchanged from previous note.  ALLERGIES:  is allergic to fish allergy; peanut-containing drug products; soy allergy; and buspirone.  MEDICATIONS:  Current Outpatient Medications  Medication Sig Dispense Refill  . ALPRAZolam (XANAX) 0.25 MG  tablet Take 1 tablet (0.25 mg total) by mouth daily as needed for anxiety. 30 tablet 0  . NORVASC 2.5 MG tablet Take 1 tablet (2.5 mg total) daily by mouth. 30 tablet 3   Current Facility-Administered Medications  Medication Dose Route Frequency Provider Last Rate Last Dose  . 0.9 %  sodium chloride infusion  500 mL Intravenous Continuous Danis, Estill Cotta III, MD        PHYSICAL EXAMINATION: ECOG PERFORMANCE STATUS: 1 - Symptomatic but completely ambulatory  Vitals:   02/15/19 0902  BP: (!) 146/85  Pulse: 87  Resp: 18  Temp: 98.3 F (36.8 C)  SpO2: 100%   Filed Weights   02/15/19 0902  Weight: 125 lb 11.2 oz (57 kg)    GENERAL:alert, no distress and comfortable SKIN: skin color, texture, turgor are normal, no rashes or significant lesions EYES: normal, Conjunctiva are pink and non-injected, sclera clear  NECK: supple, thyroid normal size, non-tender, without nodularity LYMPH:  no palpable lymphadenopathy in the cervical, axillary  LUNGS: clear to auscultation and percussion with normal breathing effort HEART: regular rate & rhythm and no murmurs and no lower extremity edema ABDOMEN:abdomen soft, non-tender and normal bowel sounds Musculoskeletal:no cyanosis of digits and no clubbing  NEURO: alert & oriented x 3 with fluent speech, no focal motor/sensory deficits  LABORATORY DATA:  I have reviewed the data as listed CBC Latest  Ref Rng & Units 02/15/2019 04/27/2018 10/06/2017  WBC 4.0 - 10.5 K/uL 2.2(L) 2.8(L) 2.6(L)  Hemoglobin 12.0 - 15.0 g/dL 11.4(L) 11.3(L) 10.4(L)  Hematocrit 36.0 - 46.0 % 36.6 36.3 33.1(L)  Platelets 150 - 400 K/uL 155 172 155     CMP Latest Ref Rng & Units 02/15/2019 04/27/2018 10/06/2017  Glucose 70 - 99 mg/dL 97 96 82  BUN 8 - 23 mg/dL '10 15 13  ' Creatinine 0.44 - 1.00 mg/dL 0.85 0.84 0.82  Sodium 135 - 145 mmol/L 141 141 140  Potassium 3.5 - 5.1 mmol/L 4.8 5.2(H) 4.3  Chloride 98 - 111 mmol/L 106 105 106  CO2 22 - 32 mmol/L '25 27 27  ' Calcium 8.9 -  10.3 mg/dL 9.2 9.8 9.4  Total Protein 6.5 - 8.1 g/dL 7.2 7.4 6.7  Total Bilirubin 0.3 - 1.2 mg/dL 0.4 0.5 0.5  Alkaline Phos 38 - 126 U/L 55 62 64  AST 15 - 41 U/L '19 18 20  ' ALT 0 - 44 U/L '10 10 11      ' RADIOGRAPHIC STUDIES: I have personally reviewed the radiological images as listed and agreed with the findings in the report. No results found.   ASSESSMENT & PLAN:  Brittney Spencer is a 68 y.o. female with   1. Cancer of ascending colon, pT4bN1bM0, stage IIIC, MSI-stable, (+) surgical margins at pelvic wall  -She was diagnosed in 12/2015. She is s/p right hemicolectomy with right salpingo oophorectomy and  adjuvant ChemoRT.  -I previously strongly recommend her to consider adjuvant chemotherapy, due to her extremely high risk of recurrence. After lengthy discussion, patient declined chemotherapy due to the concern of side effects and impact on her quality of life.  -She is clinically doing better. She has been able to maintain a healthier diet and increase exercise. She has some GI symptoms 4 months ago which have resolved after changing her diet. She denies any pain or other symptoms now  -Labs reviewed, CBC and CMP WNL except WBC 2.2, Hg 11.4, ANC 1. Iron panel and CEA still pending. Physical exam unremarkable.  -She is 3 years since her diagnosis. Will continue cancer surveillance. Next CT scan by 04/2019. If CEA elevated will scan her sooner. Will proceed with Colonoscopy with Dr. [name] in 2021.  -she has lost some weight, I encourage her to increase nutritional supplement  -We discussed COVID precautions. If she has concerning symptoms or exposure, I recommend she get COVID testing. She understands.  -F/u in 6 months    2. HTN, Anxiety -She'll follow-up with her primary care physician and continue medication -She uses Xanax as needed. -Stable.   3. Weight loss  -according to our records, she lost 13lbs in the past 10 lbs, she did change her diet lately  -I encourage  her to increase protein and carbo in her diet and consider nutritional supplement  -she will monitor her weigh at home   Plan -continue cancer surveillance -CT CAP W Contrast in 04/2019, or sooner if her CEA significantly elevated today. She is going to see her PCP Dr. Drema Dallas with lab before her CT  -Lab and f/u in 6 months    No problem-specific Assessment & Plan notes found for this encounter.   Orders Placed This Encounter  Procedures  . CT Abdomen Pelvis W Contrast    Standing Status:   Future    Standing Expiration Date:   02/15/2020    Order Specific Question:   If indicated for the ordered procedure, I  authorize the administration of contrast media per Radiology protocol    Answer:   Yes    Order Specific Question:   Preferred imaging location?    Answer:   [facility]    Order Specific Question:   Is Oral Contrast requested for this exam?    Answer:   Yes, Per Radiology protocol    Order Specific Question:   Radiology Contrast Protocol - do NOT remove file path    Answer:   \\[email or web address]  . CT Chest W Contrast    Standing Status:   Future    Standing Expiration Date:   02/15/2020    Order Specific Question:   If indicated for the ordered procedure, I authorize the administration of contrast media per Radiology protocol    Answer:   Yes    Order Specific Question:   Preferred imaging location?    Answer:   [facility]    Order Specific Question:   Radiology Contrast Protocol - do NOT remove file path    Answer:   \\[email or web address]   All questions were answered. The patient knows to call the clinic with any problems, questions or concerns. No barriers to learning was detected. I spent 20 minutes counseling the patient face to face. The total time spent in the appointment was 25 minutes and more than 50% was on counseling and review of test results     [name], MD 02/15/2019   I, Joslyn Devon,  am acting as scribe for [name], MD.   I have reviewed the above documentation for accuracy and completeness, and I agree with the above.

## 2019-02-15 ENCOUNTER — Inpatient Hospital Stay: Payer: Medicare Other | Attending: Hematology | Admitting: Hematology

## 2019-02-15 ENCOUNTER — Telehealth: Payer: Self-pay | Admitting: Hematology

## 2019-02-15 ENCOUNTER — Inpatient Hospital Stay: Payer: Medicare Other

## 2019-02-15 ENCOUNTER — Other Ambulatory Visit: Payer: Self-pay

## 2019-02-15 ENCOUNTER — Encounter: Payer: Self-pay | Admitting: Hematology

## 2019-02-15 VITALS — BP 146/85 | HR 87 | Temp 98.3°F | Resp 18 | Ht 63.5 in | Wt 125.7 lb

## 2019-02-15 DIAGNOSIS — D500 Iron deficiency anemia secondary to blood loss (chronic): Secondary | ICD-10-CM

## 2019-02-15 DIAGNOSIS — Z923 Personal history of irradiation: Secondary | ICD-10-CM | POA: Diagnosis not present

## 2019-02-15 DIAGNOSIS — Z9221 Personal history of antineoplastic chemotherapy: Secondary | ICD-10-CM | POA: Insufficient documentation

## 2019-02-15 DIAGNOSIS — I10 Essential (primary) hypertension: Secondary | ICD-10-CM | POA: Diagnosis not present

## 2019-02-15 DIAGNOSIS — C182 Malignant neoplasm of ascending colon: Secondary | ICD-10-CM | POA: Diagnosis present

## 2019-02-15 DIAGNOSIS — Z79899 Other long term (current) drug therapy: Secondary | ICD-10-CM | POA: Diagnosis not present

## 2019-02-15 DIAGNOSIS — F419 Anxiety disorder, unspecified: Secondary | ICD-10-CM | POA: Diagnosis not present

## 2019-02-15 DIAGNOSIS — R634 Abnormal weight loss: Secondary | ICD-10-CM | POA: Insufficient documentation

## 2019-02-15 LAB — IRON AND TIBC
Iron: 53 ug/dL (ref 41–142)
Saturation Ratios: 15 % — ABNORMAL LOW (ref 21–57)
TIBC: 359 ug/dL (ref 236–444)
UIBC: 306 ug/dL (ref 120–384)

## 2019-02-15 LAB — CEA (IN HOUSE-CHCC): CEA (CHCC-In House): 1.82 ng/mL (ref 0.00–5.00)

## 2019-02-15 LAB — CBC WITH DIFFERENTIAL/PLATELET
Abs Immature Granulocytes: 0 10*3/uL (ref 0.00–0.07)
Basophils Absolute: 0 10*3/uL (ref 0.0–0.1)
Basophils Relative: 1 %
Eosinophils Absolute: 0 10*3/uL (ref 0.0–0.5)
Eosinophils Relative: 2 %
HCT: 36.6 % (ref 36.0–46.0)
Hemoglobin: 11.4 g/dL — ABNORMAL LOW (ref 12.0–15.0)
Immature Granulocytes: 0 %
Lymphocytes Relative: 37 %
Lymphs Abs: 0.8 10*3/uL (ref 0.7–4.0)
MCH: 27.5 pg (ref 26.0–34.0)
MCHC: 31.1 g/dL (ref 30.0–36.0)
MCV: 88.2 fL (ref 80.0–100.0)
Monocytes Absolute: 0.4 10*3/uL (ref 0.1–1.0)
Monocytes Relative: 16 %
Neutro Abs: 1 10*3/uL — ABNORMAL LOW (ref 1.7–7.7)
Neutrophils Relative %: 44 %
Platelets: 155 10*3/uL (ref 150–400)
RBC: 4.15 MIL/uL (ref 3.87–5.11)
RDW: 13.1 % (ref 11.5–15.5)
WBC: 2.2 10*3/uL — ABNORMAL LOW (ref 4.0–10.5)
nRBC: 0 % (ref 0.0–0.2)

## 2019-02-15 LAB — COMPREHENSIVE METABOLIC PANEL
ALT: 10 U/L (ref 0–44)
AST: 19 U/L (ref 15–41)
Albumin: 4 g/dL (ref 3.5–5.0)
Alkaline Phosphatase: 55 U/L (ref 38–126)
Anion gap: 10 (ref 5–15)
BUN: 10 mg/dL (ref 8–23)
CO2: 25 mmol/L (ref 22–32)
Calcium: 9.2 mg/dL (ref 8.9–10.3)
Chloride: 106 mmol/L (ref 98–111)
Creatinine, Ser: 0.85 mg/dL (ref 0.44–1.00)
GFR calc Af Amer: >60 mL/min (ref >60)
GFR calc non Af Amer: >60 mL/min (ref >60)
Glucose, Bld: 97 mg/dL (ref 70–99)
Potassium: 4.8 mmol/L (ref 3.5–5.1)
Sodium: 141 mmol/L (ref 135–145)
Total Bilirubin: 0.4 mg/dL (ref 0.3–1.2)
Total Protein: 7.2 g/dL (ref 6.5–8.1)

## 2019-02-15 LAB — RETICULOCYTES
Immature Retic Fract: 6.7 % (ref 2.3–15.9)
RBC.: 4.15 MIL/uL (ref 3.87–5.11)
Retic Count, Absolute: 37.8 10*3/uL (ref 19.0–186.0)
Retic Ct Pct: 0.9 % (ref 0.4–3.1)

## 2019-02-15 LAB — FERRITIN: Ferritin: 23 ng/mL (ref 11–307)

## 2019-02-15 NOTE — Telephone Encounter (Signed)
Scheduled appt per 8/17 los.  Left a voice message of appt date and time.

## 2019-02-22 ENCOUNTER — Telehealth: Payer: Self-pay

## 2019-02-22 NOTE — Telephone Encounter (Signed)
Contacted patient and left a message with normal lab results and the plan is to move forward with the CT scan in October. To call back with any questions or concerns.

## 2019-02-22 NOTE — Telephone Encounter (Signed)
-----   Message from [name], MD sent at 02/21/2019 10:39 PM EDT ----- Please let pt know her tumor marker CEA and iron study, CMP were all WNL, no concerns, plan to have CT in Oct, thanks   [name]  02/21/2019

## 2019-04-05 ENCOUNTER — Telehealth: Payer: Self-pay | Admitting: Hematology

## 2019-04-05 NOTE — Telephone Encounter (Signed)
Scheduled appt per 10/5 sch message - unable to reach pt. Left message with appt date and time   

## 2019-04-19 ENCOUNTER — Inpatient Hospital Stay: Payer: Federal, State, Local not specified - PPO | Attending: Hematology

## 2019-04-19 ENCOUNTER — Ambulatory Visit (HOSPITAL_COMMUNITY)
Admission: RE | Admit: 2019-04-19 | Discharge: 2019-04-19 | Disposition: A | Discharge status: Home / Self Care | Payer: Federal, State, Local not specified - PPO | Source: Ambulatory Visit | Attending: Hematology | Admitting: Hematology

## 2019-04-19 ENCOUNTER — Other Ambulatory Visit: Payer: Self-pay

## 2019-04-19 DIAGNOSIS — C182 Malignant neoplasm of ascending colon: Secondary | ICD-10-CM

## 2019-04-19 DIAGNOSIS — D500 Iron deficiency anemia secondary to blood loss (chronic): Secondary | ICD-10-CM

## 2019-04-19 LAB — RETICULOCYTES
Immature Retic Fract: 7.5 % (ref 2.3–15.9)
RBC.: 4.02 MIL/uL (ref 3.87–5.11)
Retic Count, Absolute: 40.6 10*3/uL (ref 19.0–186.0)
Retic Ct Pct: 1 % (ref 0.4–3.1)

## 2019-04-19 LAB — CBC WITH DIFFERENTIAL/PLATELET
Abs Immature Granulocytes: 0.01 10*3/uL (ref 0.00–0.07)
Basophils Absolute: 0 10*3/uL (ref 0.0–0.1)
Basophils Relative: 0 %
Eosinophils Absolute: 0.1 10*3/uL (ref 0.0–0.5)
Eosinophils Relative: 2 %
HCT: 35.6 % — ABNORMAL LOW (ref 36.0–46.0)
Hemoglobin: 11.2 g/dL — ABNORMAL LOW (ref 12.0–15.0)
Immature Granulocytes: 0 %
Lymphocytes Relative: 35 %
Lymphs Abs: 0.9 10*3/uL (ref 0.7–4.0)
MCH: 27.9 pg (ref 26.0–34.0)
MCHC: 31.5 g/dL (ref 30.0–36.0)
MCV: 88.6 fL (ref 80.0–100.0)
Monocytes Absolute: 0.2 10*3/uL (ref 0.1–1.0)
Monocytes Relative: 9 %
Neutro Abs: 1.4 10*3/uL — ABNORMAL LOW (ref 1.7–7.7)
Neutrophils Relative %: 54 %
Platelets: 154 10*3/uL (ref 150–400)
RBC: 4.02 MIL/uL (ref 3.87–5.11)
RDW: 13 % (ref 11.5–15.5)
WBC: 2.6 10*3/uL — ABNORMAL LOW (ref 4.0–10.5)
nRBC: 0 % (ref 0.0–0.2)

## 2019-04-19 LAB — COMPREHENSIVE METABOLIC PANEL
ALT: 9 U/L (ref 0–44)
AST: 18 U/L (ref 15–41)
Albumin: 4.1 g/dL (ref 3.5–5.0)
Alkaline Phosphatase: 55 U/L (ref 38–126)
Anion gap: 12 (ref 5–15)
BUN: 13 mg/dL (ref 8–23)
CO2: 27 mmol/L (ref 22–32)
Calcium: 9.6 mg/dL (ref 8.9–10.3)
Chloride: 103 mmol/L (ref 98–111)
Creatinine, Ser: 0.92 mg/dL (ref 0.44–1.00)
GFR calc Af Amer: >60 mL/min (ref >60)
GFR calc non Af Amer: >60 mL/min (ref >60)
Glucose, Bld: 94 mg/dL (ref 70–99)
Potassium: 3.6 mmol/L (ref 3.5–5.1)
Sodium: 142 mmol/L (ref 135–145)
Total Bilirubin: 0.4 mg/dL (ref 0.3–1.2)
Total Protein: 7.4 g/dL (ref 6.5–8.1)

## 2019-04-19 LAB — CEA (IN HOUSE-CHCC): CEA (CHCC-In House): 2.32 ng/mL (ref 0.00–5.00)

## 2019-04-19 LAB — IRON AND TIBC
Iron: 62 ug/dL (ref 41–142)
Saturation Ratios: 16 % — ABNORMAL LOW (ref 21–57)
TIBC: 375 ug/dL (ref 236–444)
UIBC: 313 ug/dL (ref 120–384)

## 2019-04-19 LAB — FERRITIN: Ferritin: 20 ng/mL (ref 11–307)

## 2019-04-19 MED ORDER — SODIUM CHLORIDE (PF) 0.9 % IJ SOLN
INTRAMUSCULAR | Status: AC
  Filled 2019-04-19: qty 50

## 2019-04-19 MED ORDER — IOHEXOL 300 MG/ML  SOLN
100.0000 mL | Freq: Once | INTRAMUSCULAR | Status: AC | PRN
  Administered 2019-04-19: 100 mL via INTRAVENOUS

## 2019-08-12 NOTE — Progress Notes (Signed)
Brittney Spencer   [phone] [phone]   Clinic Follow up Note   Patient Care Team: Leighton Ruff, MD as PCP - General (Family Medicine)  Date of Service:  08/18/2019  CHIEF COMPLAINT: Follow up stage IIIC right colon cancer  SUMMARY OF ONCOLOGIC HISTORY: Oncology History Overview Note  Cancer of ascending colon [facility])   Staging form: Colon and Rectum, AJCC 7th Edition   - Clinical stage from 02/07/2016: Stage IIIC (T4b, N1b, M0) - Signed by [name], MD on 03/04/2016    Cancer of ascending colon ([place])  10/25/2015 Imaging   CT ABD/PELVIS:  Inflammatory changes inferior to the cecal tip appear improved, there is still irregular soft tissue thickening of the cecal tip, and there are adjacent prominent lymph nodes in the ileocolonic mesentery, measuring 13 mm on image 49 and 8 mm on image 52. In addition, there is a 2.5 x 1.8 cm nodule on image 46 which has central low density. Therefore, these findings are moderately suspicious for an underlying cecal malignancy with perforation.    01/19/2016 Procedure   COLONOSCOPY per Dr. [name]: Fungating, ulcerated mass almost obstructing mid ascending colon   01/19/2016 Initial Biopsy   Diagnosis Surgical [P], cecal mass - INVASIVE ADENOCARCINOMA WITH ULCERATION. - SEE COMMENT.   02/05/2016 Tumor Marker   Patient's tumor was tested for the following markers: CEA Results of the tumor marker test revealed 5.7.   02/07/2016 Initial Diagnosis   Cancer of ascending colon ([place])   02/07/2016 Definitive Surgery   Laparoscopic assisted right hemicolectomy and right salpingo oopherectomy--Dr. [name]   02/07/2016 Pathologic Stage   p T4 N1b   2/43 nodes +   02/07/2016 Pathology Results   MMR normal; G2 adenocarcinoma;proximal & distal margins negative; soft tissue mass on pelvic sidewall + for adenocarcinoma with positive margin MSI Stable   03/08/2016 Imaging   CT chest negative for metastasis.    03/19/2016 -  04/25/2016 Radiation Therapy   Adjuvant irradiation, 50 gray in 28 fractions   03/19/2016 - 04/22/2016 Chemotherapy   Xeloda 1500 mg twice daily, started on 03/19/2016, dose reduced to 1000 mg twice daily from week 3 due to neutropenia, and patient stopped 3 days before last dose radiation due to difficulty swallowing the pill    05/20/2016 -  Adjuvant Chemotherapy   Patient declined adjuvant chemotherapy   09/16/2016 Imaging   CT CAP w Contrast 1. No evidence of local tumor recurrence at the ileocolic anastomosis. 2. No findings suspicious for metastatic disease in the chest, abdomen or pelvis. 3. Nonspecific trace free fluid in the pelvic cul-de-sac. 4. Stable solitary 3 mm right upper lobe pulmonary nodule, for which 6 month stability has been demonstrated, probably benign. 5. Additional findings include stable right posterior pericardial cyst and small calcified uterine fibroids.   05/13/2017 Imaging   CT CAP W Contrast 05/13/17 IMPRESSION: 1. No current findings of residual or recurrent malignancy. 2. Mild prominence of stool throughout the colon. Nondistended portions of the rectum. 3. Several tiny pulmonary nodules are stable from the earliest available comparison of 03/08/2016 and probably benign, but may merit surveillance. 4. Other imaging findings of potential clinical significance: Old granulomatous disease. Aortoiliac atherosclerotic vascular disease. Lumbar spondylosis and degenerative disc disease. Stable amount of trace free pelvic fluid.   04/27/2018 Imaging   04/27/2018 CT CAP IMPRESSION: Stable exam. No evidence of recurrent or metastatic carcinoma within the chest, abdomen, or pelvis   04/19/2019 Imaging   CT CAP W Contrast  IMPRESSION:  Chest Impression:   1. No evidence of thoracic metastasis. 2. Stable small bilateral pulmonary nodules.   Abdomen / Pelvis Impression:   1. No evidence local colorectal carcinoma recurrence or metastasis in the  abdomen pelvis. 2. Post RIGHT hemicolectomy.      CURRENT THERAPY:  Surveillance  INTERVAL HISTORY:  Brittney Spencer is here for a follow up of colon cancer. She was last seen by me 6 months ago. She presents to the clinic alone. She notes she is doing well. She notes she received her first [place] vaccine on 2/14. She denies major reactions from this but feels anxious about it as she does not know what will happen. She plans to have her second shot in 3 weeks.  She denies stomach issues, pain or bloating. She is eating well and able to gain a few pounds. She attributes her elevated BP to her anxiety and will make note to monitor at home more often. She notes she still sees her PCP. She plans to have another colonoscopy in 03/2020.    REVIEW OF SYSTEMS:   Constitutional: Denies fevers, chills or abnormal weight loss Eyes: Denies blurriness of vision Ears, nose, mouth, throat, and face: Denies mucositis or sore throat Respiratory: Denies cough, dyspnea or wheezes Cardiovascular: Denies palpitation, chest discomfort or lower extremity swelling Gastrointestinal:  Denies nausea, heartburn or change in bowel habits Skin: Denies abnormal skin rashes Lymphatics: Denies new lymphadenopathy or easy bruising Neurological:Denies numbness, tingling or new weaknesses Behavioral/Psych: Mood is stable, no new changes  All other systems were reviewed with the patient and are negative.  MEDICAL HISTORY:  Past Medical History:  Diagnosis Date  . AAA (abdominal aortic aneurysm) ([facility])    infrarenal 4.1 cmper s-9-19 scan on chart  . Anemia   . Anxiety   . Colon cancer ([place])   . Hypertension   . Vitamin D deficiency     SURGICAL HISTORY: Past Surgical History:  Procedure Laterality Date  . COLONSCOPY  12/2015  . LAPAROSCOPIC RIGHT HEMI COLECTOMY Right 02/07/2016   Procedure: LAPAROSCOPIC ASSISTED RIGHT HEMI COLECTOMY AND RIGHT SALPINGO OOPHERECTOMY;  Surgeon: [name], MD;  Location:  [facility];  Service: General;  Laterality: Right;    I have reviewed the social history and family history with the patient and they are unchanged from previous note.  ALLERGIES:  is allergic to fish allergy; peanut-containing drug products; soy allergy; and buspirone.  MEDICATIONS:  Current Outpatient Medications  Medication Sig Dispense Refill  . ALPRAZolam (XANAX) 0.25 MG tablet Take 1 tablet (0.25 mg total) by mouth daily as needed for anxiety. 30 tablet 0  . carboxymethylcellul-glycerin (REFRESH OPTIVE) 0.5-0.9 % ophthalmic solution 1 drop.    Marland Kitchen erythromycin ophthalmic ointment SMARTSIG:1 CENTIMETER(S) In Eye(s) Every Night    . NORVASC 2.5 MG tablet Take 1 tablet (2.5 mg total) daily by mouth. 30 tablet 3   Current Facility-Administered Medications  Medication Dose Route Frequency Provider Last Rate Last Admin  . 0.9 %  sodium chloride infusion  500 mL Intravenous Continuous Danis, Estill Cotta III, MD        PHYSICAL EXAMINATION: ECOG PERFORMANCE STATUS: 0 - Asymptomatic  Vitals:   08/18/19 0945  BP: (!) 152/87  Pulse: 88  Resp: 17  Temp: 97.6 F (36.4 C)  SpO2: 100%   Filed Weights   08/18/19 0945  Weight: 127 lb 1.6 oz (57.7 kg)    GENERAL:alert, no distress and comfortable SKIN: skin color, texture, turgor are normal, no rashes or significant  lesions EYES: normal, Conjunctiva are pink and non-injected, sclera clear  NECK: supple, thyroid normal size, non-tender, without nodularity LYMPH:  no palpable lymphadenopathy in the cervical, axillary  LUNGS: clear to auscultation and percussion with normal breathing effort HEART: regular rate & rhythm and no murmurs and no lower extremity edema ABDOMEN:abdomen soft, non-tender and normal bowel sounds. No hepatomegaly.  Musculoskeletal:no cyanosis of digits and no clubbing  NEURO: alert & oriented x 3 with fluent speech, no focal motor/sensory deficits  LABORATORY DATA:  I have reviewed the data as listed CBC Latest Ref Rng  & Units 08/18/2019 04/19/2019 02/15/2019  WBC 4.0 - 10.5 K/uL 2.7(L) 2.6(L) 2.2(L)  Hemoglobin 12.0 - 15.0 g/dL 11.6(L) 11.2(L) 11.4(L)  Hematocrit 36.0 - 46.0 % 36.7 35.6(L) 36.6  Platelets 150 - 400 K/uL 181 154 155     CMP Latest Ref Rng & Units 08/18/2019 04/19/2019 02/15/2019  Glucose 70 - 99 mg/dL 105(H) 94 97  BUN 8 - 23 mg/dL '9 13 10  ' Creatinine 0.44 - 1.00 mg/dL 0.89 0.92 0.85  Sodium 135 - 145 mmol/L 142 142 141  Potassium 3.5 - 5.1 mmol/L 4.0 3.6 4.8  Chloride 98 - 111 mmol/L 106 103 106  CO2 22 - 32 mmol/L '28 27 25  ' Calcium 8.9 - 10.3 mg/dL 9.2 9.6 9.2  Total Protein 6.5 - 8.1 g/dL 7.2 7.4 7.2  Total Bilirubin 0.3 - 1.2 mg/dL 0.4 0.4 0.4  Alkaline Phos 38 - 126 U/L 58 55 55  AST 15 - 41 U/L '16 18 19  ' ALT 0 - 44 U/L '11 9 10      ' RADIOGRAPHIC STUDIES: I have personally reviewed the radiological images as listed and agreed with the findings in the report. No results found.   ASSESSMENT & PLAN:  Brittney Spencer is a 69 y.o. female with   1. Cancer of ascending colon, pT4bN1bM0, stage IIIC, MSI-stable, (+) surgical margins at pelvic wall -She was diagnosed in 12/2015. She is s/p right hemicolectomy with rightsalpingo oophorectomyand adjuvant ChemoRT.  -I previously strongly recommend her to consider adjuvant chemotherapy, due to her extremely high risk of recurrence. After lengthy discussion, patient declined chemotherapy due to the concern of side effects and impact on her quality of life.  -Her CT CAP from 04/19/19 showed NED. Her She is clinically doing well with no major concerns or symptoms. Lab reviewed, her CBC and CMP are within normal limits except stable mild neutropenia and mild anemia. CEA is still pending, previously normal. Her physical exam was unremarkable. There is no clinical concern for recurrence. -She has received her first COVID19 vaccine on 2/14. She will proceed with second on 3/3. She can use ibuprofen if needed.  -She is almost 4 years from  her cancer diagnosis. I do not plan to repeat surveillance scans unless there are concerning symptoms. Her next colonoscopy in 03/2020.  -f/u in 1 year    2. Mild Anemia  -Has been stable and mild for the past year.  -Reticulocytes normal, Iron panel still pending today (08/18/19) -She has not required IV iron. I suggest she use OTC multivitamin with iron as oral iron alone causes her constipation.    3. HTN, Anxiety -She'll follow-up with her primary care physician and continue medication -She uses Xanax as needed. -She has been anxious about her recent COVID19 vaccinations and her BP is elevated today at 152/87 (08/18/19). I encouraged her to monitor at home.   4. Weight loss  -I previously encourage her to increase  protein and carbo in her diet and consider nutritional supplement  -she will monitor her weigh at home  -She has been able to gain a few pounds recently and maintain appetite.   Plan -Lab and F/u with NP Lacie in 1 year     No problem-specific Assessment & Plan notes found for this encounter.   Orders Placed This Encounter  Procedures  . CEA (IN HOUSE-[facility])   All questions were answered. The patient knows to call the clinic with any problems, questions or concerns. No barriers to learning was detected. The total time spent in the appointment was 25 minutes.     [name], MD 08/18/2019   I, Joslyn Devon, am acting as scribe for [name], MD.   I have reviewed the above documentation for accuracy and completeness, and I agree with the above.

## 2019-08-15 ENCOUNTER — Ambulatory Visit: Payer: Federal, State, Local not specified - PPO | Attending: Internal Medicine

## 2019-08-15 DIAGNOSIS — Z23 Encounter for immunization: Secondary | ICD-10-CM | POA: Insufficient documentation

## 2019-08-15 NOTE — Progress Notes (Signed)
   Covid-19 Vaccination Clinic  Name:  Brittney Spencer    MRN: [id number] DOB: 05/27/1951  08/15/2019  Brittney Spencer was observed post Covid-19 immunization for 15 minutes without incidence. She was provided with Vaccine Information Sheet and instruction to access the V-Safe system.   Brittney Spencer was instructed to call 911 with any severe reactions post vaccine: Marland Kitchen Difficulty breathing  . Swelling of your face and throat  . A fast heartbeat  . A bad rash all over your body  . Dizziness and weakness    Immunizations Administered    Name Date Dose VIS Date Route   Pfizer COVID-19 Vaccine 08/15/2019 11:38 AM 0.3 mL 06/11/2019 Intramuscular   Manufacturer: [place]   Lot: [id number]   [place]: [id number]

## 2019-08-18 ENCOUNTER — Inpatient Hospital Stay (HOSPITAL_BASED_OUTPATIENT_CLINIC_OR_DEPARTMENT_OTHER): Payer: Federal, State, Local not specified - PPO | Admitting: Hematology

## 2019-08-18 ENCOUNTER — Inpatient Hospital Stay: Payer: Federal, State, Local not specified - PPO | Attending: Hematology

## 2019-08-18 ENCOUNTER — Other Ambulatory Visit: Payer: Self-pay

## 2019-08-18 ENCOUNTER — Encounter: Payer: Self-pay | Admitting: Hematology

## 2019-08-18 VITALS — BP 152/87 | HR 88 | Temp 97.6°F | Resp 17 | Ht 63.5 in | Wt 127.1 lb

## 2019-08-18 DIAGNOSIS — Z9221 Personal history of antineoplastic chemotherapy: Secondary | ICD-10-CM | POA: Insufficient documentation

## 2019-08-18 DIAGNOSIS — C182 Malignant neoplasm of ascending colon: Secondary | ICD-10-CM | POA: Diagnosis present

## 2019-08-18 DIAGNOSIS — F419 Anxiety disorder, unspecified: Secondary | ICD-10-CM | POA: Diagnosis not present

## 2019-08-18 DIAGNOSIS — D649 Anemia, unspecified: Secondary | ICD-10-CM | POA: Diagnosis not present

## 2019-08-18 DIAGNOSIS — Z923 Personal history of irradiation: Secondary | ICD-10-CM | POA: Diagnosis not present

## 2019-08-18 DIAGNOSIS — D500 Iron deficiency anemia secondary to blood loss (chronic): Secondary | ICD-10-CM

## 2019-08-18 DIAGNOSIS — R634 Abnormal weight loss: Secondary | ICD-10-CM | POA: Diagnosis not present

## 2019-08-18 DIAGNOSIS — I10 Essential (primary) hypertension: Secondary | ICD-10-CM | POA: Diagnosis not present

## 2019-08-18 DIAGNOSIS — Z79899 Other long term (current) drug therapy: Secondary | ICD-10-CM | POA: Insufficient documentation

## 2019-08-18 LAB — COMPREHENSIVE METABOLIC PANEL
ALT: 11 U/L (ref 0–44)
AST: 16 U/L (ref 15–41)
Albumin: 3.9 g/dL (ref 3.5–5.0)
Alkaline Phosphatase: 58 U/L (ref 38–126)
Anion gap: 8 (ref 5–15)
BUN: 9 mg/dL (ref 8–23)
CO2: 28 mmol/L (ref 22–32)
Calcium: 9.2 mg/dL (ref 8.9–10.3)
Chloride: 106 mmol/L (ref 98–111)
Creatinine, Ser: 0.89 mg/dL (ref 0.44–1.00)
GFR calc Af Amer: >60 mL/min (ref >60)
GFR calc non Af Amer: >60 mL/min (ref >60)
Glucose, Bld: 105 mg/dL — ABNORMAL HIGH (ref 70–99)
Potassium: 4 mmol/L (ref 3.5–5.1)
Sodium: 142 mmol/L (ref 135–145)
Total Bilirubin: 0.4 mg/dL (ref 0.3–1.2)
Total Protein: 7.2 g/dL (ref 6.5–8.1)

## 2019-08-18 LAB — CBC WITH DIFFERENTIAL (CANCER CENTER ONLY)
Abs Immature Granulocytes: 0 10*3/uL (ref 0.00–0.07)
Basophils Absolute: 0 10*3/uL (ref 0.0–0.1)
Basophils Relative: 1 %
Eosinophils Absolute: 0 10*3/uL (ref 0.0–0.5)
Eosinophils Relative: 2 %
HCT: 36.7 % (ref 36.0–46.0)
Hemoglobin: 11.6 g/dL — ABNORMAL LOW (ref 12.0–15.0)
Immature Granulocytes: 0 %
Lymphocytes Relative: 38 %
Lymphs Abs: 1 10*3/uL (ref 0.7–4.0)
MCH: 27.8 pg (ref 26.0–34.0)
MCHC: 31.6 g/dL (ref 30.0–36.0)
MCV: 87.8 fL (ref 80.0–100.0)
Monocytes Absolute: 0.4 10*3/uL (ref 0.1–1.0)
Monocytes Relative: 14 %
Neutro Abs: 1.3 10*3/uL — ABNORMAL LOW (ref 1.7–7.7)
Neutrophils Relative %: 45 %
Platelet Count: 181 10*3/uL (ref 150–400)
RBC: 4.18 MIL/uL (ref 3.87–5.11)
RDW: 13 % (ref 11.5–15.5)
WBC Count: 2.7 10*3/uL — ABNORMAL LOW (ref 4.0–10.5)
nRBC: 0 % (ref 0.0–0.2)

## 2019-08-18 LAB — IRON AND TIBC
Iron: 61 ug/dL (ref 41–142)
Saturation Ratios: 17 % — ABNORMAL LOW (ref 21–57)
TIBC: 363 ug/dL (ref 236–444)
UIBC: 302 ug/dL (ref 120–384)

## 2019-08-18 LAB — RETICULOCYTES
Immature Retic Fract: 9.2 % (ref 2.3–15.9)
RBC.: 4.14 MIL/uL (ref 3.87–5.11)
Retic Count, Absolute: 31.5 10*3/uL (ref 19.0–186.0)
Retic Ct Pct: 0.8 % (ref 0.4–3.1)

## 2019-08-18 LAB — CEA (IN HOUSE-CHCC): CEA (CHCC-In House): 3.2 ng/mL (ref 0.00–5.00)

## 2019-08-18 LAB — FERRITIN: Ferritin: 26 ng/mL (ref 11–307)

## 2019-08-19 ENCOUNTER — Telehealth: Payer: Self-pay | Admitting: Hematology

## 2019-08-19 NOTE — Telephone Encounter (Signed)
Scheduled appt per 2/17 los.  Sent a message to HIM pool to get a calendar mailed out. 

## 2019-08-20 ENCOUNTER — Telehealth: Payer: Self-pay | Admitting: *Deleted

## 2019-08-20 NOTE — Telephone Encounter (Signed)
-----   Message from [name], MD sent at 08/19/2019  5:38 PM EST ----- Please let pt know her lab results, no concerns, thanks   [name]  08/19/2019

## 2019-08-20 NOTE — Telephone Encounter (Signed)
Called pt & left vm to call back.  She called back & this nurse then returned her call & left message that labs OK & no concerns per Dr Burr Medico & to call us if she has questions.

## 2019-09-07 ENCOUNTER — Ambulatory Visit: Payer: Federal, State, Local not specified - PPO | Attending: Internal Medicine

## 2019-09-07 DIAGNOSIS — Z23 Encounter for immunization: Secondary | ICD-10-CM | POA: Insufficient documentation

## 2019-09-07 NOTE — Progress Notes (Signed)
   Covid-19 Vaccination Clinic  Name:  BRITT ALBACH    MRN: [id number] DOB: Jul 28, 1950  09/07/2019  Ms. Schaffer was observed post Covid-19 immunization for 15 minutes without incident. She was provided with Vaccine Information Sheet and instruction to access the V-Safe system.   Ms. Glazner was instructed to call 911 with any severe reactions post vaccine: Marland Kitchen Difficulty breathing  . Swelling of face and throat  . A fast heartbeat  . A bad rash all over body  . Dizziness and weakness   Immunizations Administered    Name Date Dose VIS Date Route   Pfizer COVID-19 Vaccine 09/07/2019  2:52 PM 0.3 mL 06/11/2019 Intramuscular   Manufacturer: [place]   Lot: [id number]   [place]: [id number]

## 2019-09-27 ENCOUNTER — Telehealth: Payer: Self-pay

## 2019-09-27 NOTE — Telephone Encounter (Signed)
Brittney Spencer called requesting prescription for alprazolam.  I told her she would need to get that from her PCP.  She verbalized understanding.

## 2019-09-30 DIAGNOSIS — H3321 Serous retinal detachment, right eye: Secondary | ICD-10-CM

## 2019-09-30 HISTORY — PX: RETINAL DETACHMENT SURGERY: SHX105

## 2019-09-30 HISTORY — DX: Serous retinal detachment, right eye: H33.21

## 2019-10-06 ENCOUNTER — Encounter (INDEPENDENT_AMBULATORY_CARE_PROVIDER_SITE_OTHER): Payer: Self-pay

## 2019-10-06 ENCOUNTER — Ambulatory Visit (INDEPENDENT_AMBULATORY_CARE_PROVIDER_SITE_OTHER): Payer: Federal, State, Local not specified - PPO

## 2019-10-06 DIAGNOSIS — H43821 Vitreomacular adhesion, right eye: Secondary | ICD-10-CM

## 2019-10-06 DIAGNOSIS — H4321 Crystalline deposits in vitreous body, right eye: Secondary | ICD-10-CM | POA: Insufficient documentation

## 2019-10-06 DIAGNOSIS — H35371 Puckering of macula, right eye: Secondary | ICD-10-CM

## 2019-10-06 HISTORY — DX: Vitreomacular adhesion, right eye: H43.821

## 2019-10-06 HISTORY — DX: Puckering of macula, right eye: H35.371

## 2019-10-06 HISTORY — DX: Crystalline deposits in vitreous body, right eye: H43.21

## 2019-10-06 NOTE — Assessment & Plan Note (Addendum)
Plan  Surgical repair via posterior vitrectomy, membrane peel and release of vitreal retinal traction and likely injection of gas right eye.  This will be done in combination surgery with Dr. Audry Pili who will perform catheter extraction with intraocular lens placement right eye.  Surgery will be performed to [facility] surgical center, local retrobulbar amount anesthesia control.

## 2019-10-06 NOTE — Progress Notes (Signed)
10/06/2019     CHIEF COMPLAINT Patient presents for Pre-op Exam   HISTORY OF PRESENT ILLNESS: Brittney Spencer is a 69 y.o. female who presents to the clinic today for:   HPI    Pre op for combo case on 10/15/2019 OD with Dr. Talbert Forest CEIOL/Vit/gas OD    Last edited by [name], [place] on 10/06/2019  2:23 PM. (History)        HISTORICAL INFORMATION:   Selected notes from the MEDICAL RECORD NUMBER       CURRENT MEDICATIONS: Current Outpatient Medications (Ophthalmic Drugs)  Medication Sig  . carboxymethylcellul-glycerin (REFRESH OPTIVE) 0.5-0.9 % ophthalmic solution 1 drop.  Marland Kitchen erythromycin ophthalmic ointment SMARTSIG:1 CENTIMETER(S) In Eye(s) Every Night  . LOTEMAX 0.5 % ophthalmic suspension 1 drop 4 (four) times daily.   No current facility-administered medications for this visit. (Ophthalmic Drugs)   Current Outpatient Medications (Other)  Medication Sig  . ALPRAZolam (XANAX) 0.25 MG tablet Take 1 tablet (0.25 mg total) by mouth daily as needed for anxiety.  . NORVASC 2.5 MG tablet Take 1 tablet (2.5 mg total) daily by mouth.   Current Facility-Administered Medications (Other)  Medication Route  . 0.9 %  sodium chloride infusion Intravenous     ALLERGIES Allergies  Allergen Reactions  . Fish Allergy Itching and Swelling  . Peanut-Containing Drug Products     Itching throat  . Soy Allergy Other (See Comments)    Stomach aches  . Buspirone Anxiety    PAST MEDICAL HISTORY Past Medical History:  Diagnosis Date  . AAA (abdominal aortic aneurysm) ([facility])    infrarenal 4.1 cmper s-9-19 scan on chart  . Anemia   . Anxiety   . Colon cancer ([place])   . Hypertension   . Vitamin D deficiency    Past Surgical History:  Procedure Laterality Date  . COLONSCOPY  12/2015  . LAPAROSCOPIC RIGHT HEMI COLECTOMY Right 02/07/2016   Procedure: LAPAROSCOPIC ASSISTED RIGHT HEMI COLECTOMY AND RIGHT SALPINGO OOPHERECTOMY;  Surgeon: [name], MD;  Location: [facility];   Service: General;  Laterality: Right;    FAMILY HISTORY Family History  Problem Relation Age of Onset  . Cancer Mother        lung cancer  . Cancer Maternal Grandfather        unknown cancer   . Colon cancer Neg Hx   . Esophageal cancer Neg Hx   . Rectal cancer Neg Hx   . Stomach cancer Neg Hx     SOCIAL HISTORY Social History   Tobacco Use  . Smoking status: Never Smoker  . Smokeless tobacco: Never Used  Substance Use Topics  . Alcohol use: No    Alcohol/week: 0.0 standard drinks  . Drug use: No         OPHTHALMIC EXAM:  Base Eye Exam    Visual Acuity (Snellen - Linear)      Right Left   Dist cc 20/100 -2 20/25 +1   Dist ph cc 20/80 ecc -3    Correction: Glasses       Tonometry (Tonopen, 2:27 PM)      Right Left   Pressure 12 12          IMAGING AND PROCEDURES  Imaging and Procedures for @TODAY @           ASSESSMENT/PLAN:    ICD-10-CM   1. 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  This will be done in combination surgery with Dr. Audry Pili who will perform cataract extraction with intraocular lens placement right eye.  Surgery will be performed to [facility] surgical center, local retrobulbar amount anesthesia control.     Ophthalmic Meds Ordered this visit:  No orders of the defined types were placed in this encounter.           Pre-op completed. Operative consent obtained with pre-op eye drops reviewed with Collene Leyden and sent via e-RX as needed. Post op instructions reviewed with patient and per patient all questions answered.  [name], COA

## 2019-10-15 ENCOUNTER — Encounter (INDEPENDENT_AMBULATORY_CARE_PROVIDER_SITE_OTHER): Payer: Federal, State, Local not specified - PPO | Admitting: Ophthalmology

## 2019-10-15 DIAGNOSIS — H35341 Macular cyst, hole, or pseudohole, right eye: Secondary | ICD-10-CM

## 2019-10-16 ENCOUNTER — Ambulatory Visit (INDEPENDENT_AMBULATORY_CARE_PROVIDER_SITE_OTHER): Payer: Federal, State, Local not specified - PPO | Admitting: Ophthalmology

## 2019-10-16 DIAGNOSIS — Z09 Encounter for follow-up examination after completed treatment for conditions other than malignant neoplasm: Secondary | ICD-10-CM

## 2019-10-16 NOTE — Patient Instructions (Signed)
Macular hole surgery was explained with the need for a gas injection. The patient is aware of proper face down position (like looking downward naturally while  reading a book in a lap) for 3-5 days. Patient was advised to not lay on their back while sleeping or resting, usually for 2 weeks. Do not travel to places of elevation while gas bubble is in the eye, usually for 2 weeks   Patient is using Besifloxacin OD 3 times daily, PROLENSA OD 3 times daily and Durezol OD 3 times daily, as per Dr. Merry Proud instructions.

## 2019-10-21 ENCOUNTER — Ambulatory Visit (INDEPENDENT_AMBULATORY_CARE_PROVIDER_SITE_OTHER): Payer: Federal, State, Local not specified - PPO | Admitting: Ophthalmology

## 2019-10-21 ENCOUNTER — Encounter (INDEPENDENT_AMBULATORY_CARE_PROVIDER_SITE_OTHER): Payer: Self-pay | Admitting: Ophthalmology

## 2019-10-21 ENCOUNTER — Other Ambulatory Visit: Payer: Self-pay

## 2019-10-21 DIAGNOSIS — Z09 Encounter for follow-up examination after completed treatment for conditions other than malignant neoplasm: Secondary | ICD-10-CM

## 2019-10-21 DIAGNOSIS — H43821 Vitreomacular adhesion, right eye: Secondary | ICD-10-CM | POA: Diagnosis not present

## 2019-10-21 DIAGNOSIS — H35341 Macular cyst, hole, or pseudohole, right eye: Secondary | ICD-10-CM | POA: Diagnosis not present

## 2019-10-21 NOTE — Progress Notes (Signed)
10/21/2019     CHIEF COMPLAINT Patient presents for Post-op Follow-up   HISTORY OF PRESENT ILLNESS: Brittney Spencer is a 69 y.o. female who presents to the clinic today for:   HPI    Post-op Follow-up    In right eye.  Discomfort includes Negative for pain and itching.  Vision is improved.          Comments    1 week PO OD - S/p vitrectomy ILM peel peel gas inj,              OCT OU Patient overall has no complaints. Patient states the red is getting better. Patient states she is starting to be able to make things out and colors are coming back.       Last edited by Hurman Horn, MD on 10/21/2019  9:44 AM. (History)      Referring physician: Leighton Ruff, MD [place],   [id number]  HISTORICAL INFORMATION:   Selected notes from the MEDICAL RECORD NUMBER       CURRENT MEDICATIONS: Current Outpatient Medications (Ophthalmic Drugs)  Medication Sig  . carboxymethylcellul-glycerin (REFRESH OPTIVE) 0.5-0.9 % ophthalmic solution 1 drop.  Marland Kitchen erythromycin ophthalmic ointment SMARTSIG:1 CENTIMETER(S) In Eye(s) Every Night  . LOTEMAX 0.5 % ophthalmic suspension 1 drop 4 (four) times daily.   No current facility-administered medications for this visit. (Ophthalmic Drugs)   Current Outpatient Medications (Other)  Medication Sig  . ALPRAZolam (XANAX) 0.25 MG tablet Take 1 tablet (0.25 mg total) by mouth daily as needed for anxiety.  . NORVASC 2.5 MG tablet Take 1 tablet (2.5 mg total) daily by mouth.   Current Facility-Administered Medications (Other)  Medication Route  . 0.9 %  sodium chloride infusion Intravenous      REVIEW OF SYSTEMS:    ALLERGIES Allergies  Allergen Reactions  . Fish Allergy Itching and Swelling  . Peanut-Containing Drug Products     Itching throat  . Soy Allergy Other (See Comments)    Stomach aches  . Buspirone Anxiety    PAST MEDICAL HISTORY Past Medical History:  Diagnosis Date  . AAA (abdominal aortic  aneurysm) (HCC)    infrarenal 4.1 cmper s-9-19 scan on chart  . Anemia   . Anxiety   . Colon cancer ([place])   . Hypertension   . Vitamin D deficiency    Past Surgical History:  Procedure Laterality Date  . COLONSCOPY  12/2015  . LAPAROSCOPIC RIGHT HEMI COLECTOMY Right 02/07/2016   Procedure: LAPAROSCOPIC ASSISTED RIGHT HEMI COLECTOMY AND RIGHT SALPINGO OOPHERECTOMY;  Surgeon: [name], MD;  Location: [facility];  Service: General;  Laterality: Right;    FAMILY HISTORY Family History  Problem Relation Age of Onset  . Cancer Mother        lung cancer  . Cancer Maternal Grandfather        unknown cancer   . Colon cancer Neg Hx   . Esophageal cancer Neg Hx   . Rectal cancer Neg Hx   . Stomach cancer Neg Hx     SOCIAL HISTORY Social History   Tobacco Use  . Smoking status: Never Smoker  . Smokeless tobacco: Never Used  Substance Use Topics  . Alcohol use: No    Alcohol/week: 0.0 standard drinks  . Drug use: No         OPHTHALMIC EXAM:  Base Eye Exam    Visual Acuity (Snellen - Linear)      Right Left  Dist [place] 20/200-1 20/50+1   Dist ph [place] 20/100-1 20/30-1       Tonometry (Tonopen, 8:49 AM)      Right Left   Pressure 9 14       Dilation    Right eye: 1.0% Mydriacyl, 2.5% Phenylephrine @ 8:49 AM        Slit Lamp and Fundus Exam    External Exam      Right Left   External Normal Normal       Slit Lamp Exam      Right Left   Lids/Lashes Normal Normal   Conjunctiva/Sclera White and quiet White and quiet   Cornea Clear Clear   Anterior Chamber Deep and quiet Deep and quiet   Iris Round and reactive Round and reactive   Lens Posterior chamber intraocular lens    Anterior Vitreous Normal Normal       Fundus Exam      Right Left   Posterior Vitreous 55% gas    Disc Normal    C/D Ratio No details    Macula Normal,, macular hole appears closed through the gas in recumbent position    Vessels Normal    Periphery Normal           IMAGING AND  PROCEDURES  Imaging and Procedures for 10/21/19  Color Fundus Photography Optos - OU - Both Eyes       Right Eye Progression has improved. Disc findings include normal observations. Macula : normal observations, macular hole. Vessels : normal observations. Periphery : normal observations.   Left Eye Progression has been stable. Disc findings include normal observations. Macula : normal observations. Vessels : normal observations.   Notes OD, macular hole appears closed                ASSESSMENT/PLAN:  No problem-specific Assessment & Plan notes found for this encounter.      ICD-10-CM   1. Macular hole of right eye  H35.341 Color Fundus Photography Optos - OU - Both Eyes  2. Vitreomacular traction syndrome, right  H43.821 Color Fundus Photography Optos - OU - Both Eyes  3. Follow-up examination after eye surgery  Z09     1.  Continue to taper her topical Durezol scheduled by Dr. Talbert Forest  2.  Complete Besivance  3.  Do not refill any of her topical eye medications  Ophthalmic Meds Ordered this visit:  No orders of the defined types were placed in this encounter.      No follow-ups on file.  There are no Patient Instructions on file for this visit.   Explained the diagnoses, plan, and follow up with the patient and they expressed understanding.  Patient expressed understanding of the importance of proper follow up care.   Clent Demark Aanchal Cope M.D. Diseases & Surgery of the Retina and Vitreous Retina & Diabetic [place] 10/21/19     Abbreviations: M myopia (nearsighted); A astigmatism; H hyperopia (farsighted); P presbyopia; Mrx spectacle prescription;  CTL contact lenses; OD right eye; OS left eye; OU both eyes  XT exotropia; ET esotropia; PEK punctate epithelial keratitis; PEE punctate epithelial erosions; DES dry eye syndrome; MGD meibomian gland dysfunction; ATs artificial tears; PFAT's preservative free artificial tears; [place] nuclear sclerotic cataract; PSC  posterior subcapsular cataract; ERM epi-retinal membrane; PVD posterior vitreous detachment; RD retinal detachment; DM diabetes mellitus; DR diabetic retinopathy; NPDR non-proliferative diabetic retinopathy; PDR proliferative diabetic retinopathy; CSME clinically significant macular edema; DME diabetic macular edema; dbh dot blot hemorrhages; CWS  cotton wool spot; POAG primary open angle glaucoma; C/D cup-to-disc ratio; HVF humphrey visual field; GVF goldmann visual field; OCT optical coherence tomography; IOP intraocular pressure; BRVO Branch retinal vein occlusion; CRVO central retinal vein occlusion; CRAO central retinal artery occlusion; BRAO branch retinal artery occlusion; RT retinal tear; SB scleral buckle; PPV pars plana vitrectomy; VH Vitreous hemorrhage; PRP panretinal laser photocoagulation; IVK intravitreal kenalog; VMT vitreomacular traction; MH Macular hole;  NVD neovascularization of the disc; NVE neovascularization elsewhere; AREDS age related eye disease study; ARMD age related macular degeneration; POAG primary open angle glaucoma; EBMD epithelial/anterior basement membrane dystrophy; ACIOL anterior chamber intraocular lens; IOL intraocular lens; PCIOL posterior chamber intraocular lens; Phaco/IOL phacoemulsification with intraocular lens placement; [place] photorefractive keratectomy; LASIK laser assisted in situ keratomileusis; HTN hypertension; DM diabetes mellitus; COPD chronic obstructive pulmonary disease

## 2019-10-27 ENCOUNTER — Encounter (INDEPENDENT_AMBULATORY_CARE_PROVIDER_SITE_OTHER): Payer: Self-pay | Admitting: Ophthalmology

## 2019-10-27 ENCOUNTER — Ambulatory Visit (INDEPENDENT_AMBULATORY_CARE_PROVIDER_SITE_OTHER): Payer: Federal, State, Local not specified - PPO | Admitting: Ophthalmology

## 2019-10-27 ENCOUNTER — Other Ambulatory Visit: Payer: Self-pay

## 2019-10-27 DIAGNOSIS — H43821 Vitreomacular adhesion, right eye: Secondary | ICD-10-CM | POA: Diagnosis not present

## 2019-10-27 DIAGNOSIS — Z09 Encounter for follow-up examination after completed treatment for conditions other than malignant neoplasm: Secondary | ICD-10-CM | POA: Diagnosis not present

## 2019-10-27 NOTE — Progress Notes (Signed)
10/27/2019     CHIEF COMPLAINT Patient presents for Post-op Follow-up   HISTORY OF PRESENT ILLNESS: Brittney Spencer is a 69 y.o. female who presents to the clinic today for:   HPI    Post-op Follow-up    In right eye.  Discomfort includes none.  Negative for pain, itching, foreign body sensation, tearing, discharge and floaters.  Vision is improved.  I, the attending physician,  performed the HPI with the patient and updated documentation appropriately.          Comments    1 Week s\p OD. OCT  Pt states OD is doing well. Pt states vision has improved. Denies discomfort and pain. Pt states she has finished Besivance gtts and Prolensa gtts expires on 4/30       Last edited by Tilda Franco on 10/27/2019  8:12 AM. (History)      Referring physician: Leighton Ruff, MD [place],  [place] [id number]  HISTORICAL INFORMATION:   Selected notes from the MEDICAL RECORD NUMBER       CURRENT MEDICATIONS: Current Outpatient Medications (Ophthalmic Drugs)  Medication Sig  . carboxymethylcellul-glycerin (REFRESH OPTIVE) 0.5-0.9 % ophthalmic solution 1 drop.  Marland Kitchen erythromycin ophthalmic ointment SMARTSIG:1 CENTIMETER(S) In Eye(s) Every Night  . LOTEMAX 0.5 % ophthalmic suspension 1 drop 4 (four) times daily.   No current facility-administered medications for this visit. (Ophthalmic Drugs)   Current Outpatient Medications (Other)  Medication Sig  . ALPRAZolam (XANAX) 0.25 MG tablet Take 1 tablet (0.25 mg total) by mouth daily as needed for anxiety.  . NORVASC 2.5 MG tablet Take 1 tablet (2.5 mg total) daily by mouth.   Current Facility-Administered Medications (Other)  Medication Route  . 0.9 %  sodium chloride infusion Intravenous      REVIEW OF SYSTEMS:    ALLERGIES Allergies  Allergen Reactions  . Fish Allergy Itching and Swelling  . Peanut-Containing Drug Products     Itching throat  . Soy Allergy Other (See Comments)    Stomach aches  .  Buspirone Anxiety    PAST MEDICAL HISTORY Past Medical History:  Diagnosis Date  . AAA (abdominal aortic aneurysm) (HCC)    infrarenal 4.1 cmper s-9-19 scan on chart  . Anemia   . Anxiety   . Colon cancer ([place])   . Hypertension   . Vitamin D deficiency    Past Surgical History:  Procedure Laterality Date  . COLONSCOPY  12/2015  . LAPAROSCOPIC RIGHT HEMI COLECTOMY Right 02/07/2016   Procedure: LAPAROSCOPIC ASSISTED RIGHT HEMI COLECTOMY AND RIGHT SALPINGO OOPHERECTOMY;  Surgeon: [name], MD;  Location: [facility];  Service: General;  Laterality: Right;    FAMILY HISTORY Family History  Problem Relation Age of Onset  . Cancer Mother        lung cancer  . Cancer Maternal Grandfather        unknown cancer   . Colon cancer Neg Hx   . Esophageal cancer Neg Hx   . Rectal cancer Neg Hx   . Stomach cancer Neg Hx     SOCIAL HISTORY Social History   Tobacco Use  . Smoking status: Never Smoker  . Smokeless tobacco: Never Used  Substance Use Topics  . Alcohol use: No    Alcohol/week: 0.0 standard drinks  . Drug use: No         OPHTHALMIC EXAM: Base Eye Exam    Visual Acuity (Snellen - Linear)      Right Left  Dist [place] 20/200 20/50   Dist ph [place] 20/50 - 20/30 +       Tonometry (Tonopen, 8:19 AM)      Right Left   Pressure 18 18       Pupils      Pupils Dark Light Shape React APD   Right PERRL 4 3 Round Brisk None   Left PERRL 4 3 Round Brisk None       Visual Fields (Counting fingers)      Left Right    Full Full       Neuro/Psych    Oriented x3: Yes   Mood/Affect: Normal       Dilation    Right eye: 1.0% Mydriacyl, 2.5% Phenylephrine @ 8:19 AM        Slit Lamp and Fundus Exam    External Exam      Right Left   External Normal Normal       Slit Lamp Exam      Right Left   Lids/Lashes Normal Normal   Conjunctiva/Sclera White and quiet White and quiet   Cornea Clear Clear   Anterior Chamber Deep and quiet Deep and quiet   Iris Round and  reactive Round and reactive   Lens Posterior chamber intraocular lens    Anterior Vitreous Normal Normal       Fundus Exam      Right Left   Disc Normal    C/D Ratio No details    Macula Normal,, macular hole appears closed through the gas in recumbent position    Vessels Normal    Periphery Normal           IMAGING AND PROCEDURES  Imaging and Procedures for 10/27/19           ASSESSMENT/PLAN:  No problem-specific Assessment & Plan notes found for this encounter.      ICD-10-CM   1. 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 Follow-up examination after eye surgery  Z09 OCT, Retina - OU - Both Eyes    1.  Patient is to complete her topical line medications to the right eye at the close of 3 weeks post surgery.  2.  Patient no longer requires postoperative positioning.  3.  Patient understands that typically I and encourage oral visual consumption to improve neuro healing of the retina.  She has many allergies in the past and will not undertake this effort at this time.  Ophthalmic Meds Ordered this visit:  No orders of the defined types were placed in this encounter.      No follow-ups on file.  There are no Patient Instructions on file for this visit.   Explained the diagnoses, plan, and follow up with the patient and they expressed understanding.  Patient expressed understanding of the importance of proper follow up care.   Clent Demark Kallee Nam M.D. Diseases & Surgery of the Retina and Vitreous Retina & Diabetic [place] 10/27/19     Abbreviations: M myopia (nearsighted); A astigmatism; H hyperopia (farsighted); P presbyopia; Mrx spectacle prescription;  CTL contact lenses; OD right eye; OS left eye; OU both eyes  XT exotropia; ET esotropia; PEK punctate epithelial keratitis; PEE punctate epithelial erosions; DES dry eye syndrome; MGD meibomian gland dysfunction; ATs artificial tears; PFAT's preservative free  artificial tears; [place] nuclear sclerotic cataract; PSC posterior subcapsular cataract; ERM epi-retinal membrane; PVD posterior vitreous detachment; RD retinal detachment; DM diabetes mellitus; DR diabetic retinopathy; NPDR  non-proliferative diabetic retinopathy; PDR proliferative diabetic retinopathy; CSME clinically significant macular edema; DME diabetic macular edema; dbh dot blot hemorrhages; CWS cotton wool spot; POAG primary open angle glaucoma; C/D cup-to-disc ratio; HVF humphrey visual field; GVF goldmann visual field; OCT optical coherence tomography; IOP intraocular pressure; BRVO Branch retinal vein occlusion; CRVO central retinal vein occlusion; CRAO central retinal artery occlusion; BRAO branch retinal artery occlusion; RT retinal tear; SB scleral buckle; PPV pars plana vitrectomy; VH Vitreous hemorrhage; PRP panretinal laser photocoagulation; IVK intravitreal kenalog; VMT vitreomacular traction; MH Macular hole;  NVD neovascularization of the disc; NVE neovascularization elsewhere; AREDS age related eye disease study; ARMD age related macular degeneration; POAG primary open angle glaucoma; EBMD epithelial/anterior basement membrane dystrophy; ACIOL anterior chamber intraocular lens; IOL intraocular lens; PCIOL posterior chamber intraocular lens; Phaco/IOL phacoemulsification with intraocular lens placement; [place] photorefractive keratectomy; LASIK laser assisted in situ keratomileusis; HTN hypertension; DM diabetes mellitus; COPD chronic obstructive pulmonary disease

## 2019-12-22 ENCOUNTER — Encounter (INDEPENDENT_AMBULATORY_CARE_PROVIDER_SITE_OTHER): Payer: Self-pay | Admitting: Ophthalmology

## 2019-12-22 ENCOUNTER — Other Ambulatory Visit: Payer: Self-pay

## 2019-12-22 ENCOUNTER — Ambulatory Visit (INDEPENDENT_AMBULATORY_CARE_PROVIDER_SITE_OTHER): Payer: Federal, State, Local not specified - PPO | Admitting: Ophthalmology

## 2019-12-22 DIAGNOSIS — H3340 Traction detachment of retina, unspecified eye: Secondary | ICD-10-CM | POA: Insufficient documentation

## 2019-12-22 DIAGNOSIS — H3341 Traction detachment of retina, right eye: Secondary | ICD-10-CM

## 2019-12-22 DIAGNOSIS — H43821 Vitreomacular adhesion, right eye: Secondary | ICD-10-CM

## 2019-12-22 HISTORY — DX: Traction detachment of retina, unspecified eye: H33.40

## 2019-12-22 NOTE — Assessment & Plan Note (Signed)
,   No further anterior posterior traction remains post surgical intervention via vitrectomy.

## 2019-12-22 NOTE — Progress Notes (Signed)
12/22/2019     CHIEF COMPLAINT Patient presents for Post-op Follow-up   HISTORY OF PRESENT ILLNESS: Brittney Spencer is a 69 y.o. female who presents to the clinic today for:   HPI    Post-op Follow-up    In right eye.  Vision is improved.          Comments    10 Weeks Post OP OD   Pt reports improving VA OD. Pt sts she may need stronger reading RX for small print. Pt denies ocular pain.       Last edited by [name], [place] on 12/22/2019  8:05 AM. (History)      Referring physician: Leighton Ruff, MD [place],  [place] [id number]  HISTORICAL INFORMATION:   Selected notes from the MEDICAL RECORD NUMBER       CURRENT MEDICATIONS: Current Outpatient Medications (Ophthalmic Drugs)  Medication Sig  . carboxymethylcellul-glycerin (REFRESH OPTIVE) 0.5-0.9 % ophthalmic solution 1 drop.  Marland Kitchen erythromycin ophthalmic ointment SMARTSIG:1 CENTIMETER(S) In Eye(s) Every Night  . LOTEMAX 0.5 % ophthalmic suspension 1 drop 4 (four) times daily.   No current facility-administered medications for this visit. (Ophthalmic Drugs)   Current Outpatient Medications (Other)  Medication Sig  . ALPRAZolam (XANAX) 0.25 MG tablet Take 1 tablet (0.25 mg total) by mouth daily as needed for anxiety.  . NORVASC 2.5 MG tablet Take 1 tablet (2.5 mg total) daily by mouth.   Current Facility-Administered Medications (Other)  Medication Route  . 0.9 %  sodium chloride infusion Intravenous      REVIEW OF SYSTEMS:    ALLERGIES Allergies  Allergen Reactions  . Fish Allergy Itching and Swelling  . Peanut-Containing Drug Products     Itching throat  . Soy Allergy Other (See Comments)    Stomach aches  . Buspirone Anxiety    PAST MEDICAL HISTORY Past Medical History:  Diagnosis Date  . AAA (abdominal aortic aneurysm) (HCC)    infrarenal 4.1 cmper s-9-19 scan on chart  . Anemia   . Anxiety   . Colon cancer ([place])   . Hypertension   . Vitamin D deficiency     Past Surgical History:  Procedure Laterality Date  . COLONSCOPY  12/2015  . LAPAROSCOPIC RIGHT HEMI COLECTOMY Right 02/07/2016   Procedure: LAPAROSCOPIC ASSISTED RIGHT HEMI COLECTOMY AND RIGHT SALPINGO OOPHERECTOMY;  Surgeon: [name], MD;  Location: [facility];  Service: General;  Laterality: Right;    FAMILY HISTORY Family History  Problem Relation Age of Onset  . Cancer Mother        lung cancer  . Cancer Maternal Grandfather        unknown cancer   . Colon cancer Neg Hx   . Esophageal cancer Neg Hx   . Rectal cancer Neg Hx   . Stomach cancer Neg Hx     SOCIAL HISTORY Social History   Tobacco Use  . Smoking status: Never Smoker  . Smokeless tobacco: Never Used  Vaping Use  . Vaping Use: Never used  Substance Use Topics  . Alcohol use: No    Alcohol/week: 0.0 standard drinks  . Drug use: No         OPHTHALMIC EXAM:  Base Eye Exam    Visual Acuity (ETDRS)      Right Left   Dist [place] 20/50 -1 20/25 -2   Dist ph cc 20/50 +1    Correction: Glasses       Tonometry (Tonopen, 8:10 AM)  Right Left   Pressure 10 14       Pupils      Pupils Dark Light Shape React APD   Right PERRL 4 3 Round Brisk None   Left PERRL 4 3 Round Brisk None       Visual Fields (Counting fingers)      Left Right    Full Full       Extraocular Movement      Right Left    Full Full       Neuro/Psych    Oriented x3: Yes   Mood/Affect: Normal       Dilation    Right eye: 1.0% Mydriacyl, 2.5% Phenylephrine @ 8:10 AM        Slit Lamp and Fundus Exam    External Exam      Right Left   External Normal Normal       Slit Lamp Exam      Right Left   Lids/Lashes Normal Normal   Conjunctiva/Sclera White and quiet White and quiet   Cornea Clear Clear   Anterior Chamber Deep and quiet Deep and quiet   Iris Round and reactive Round and reactive   Lens Posterior chamber intraocular lens    Anterior Vitreous Normal Normal       Fundus Exam      Right Left    Posterior Vitreous Vitrectomized    Disc Normal    C/D Ratio 0.2    Macula Normal,, macular region is now flat, no holes or tears noted,, shiny ILM, no striae.    Vessels Normal    Periphery Normal           IMAGING AND PROCEDURES  Imaging and Procedures for 12/22/19  OCT, Retina - OU - Both Eyes       Right Eye Quality was good. Scan locations included subfoveal. Central Foveal Thickness: 342. Findings include subretinal fluid.   Left Eye Quality was good. Scan locations included subfoveal. Central Foveal Thickness: 276. Findings include vitreomacular adhesion .   Notes OD, much less subretinal fluid, much smaller foveal detachment, still subretinal fluid with intraretinal fluid remaining. Improved post vitrectomy April 2021                ASSESSMENT/PLAN:  Vitreomacular traction syndrome, right , No further anterior posterior traction remains post surgical intervention via vitrectomy.      ICD-10-CM   1. 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 Traction detachment of right retina  H33.41     1. Observe.  2.  3.  Ophthalmic Meds Ordered this visit:  No orders of the defined types were placed in this encounter.      Return in about 3 months (around 03/23/2020) for DILATE OU, OCT.  There are no Patient Instructions on file for this visit.   Explained the diagnoses, plan, and follow up with the patient and they expressed understanding.  Patient expressed understanding of the importance of proper follow up care.   Clent Demark Virda Betters M.D. Diseases & Surgery of the Retina and Vitreous Retina & Diabetic [place] 12/22/19     Abbreviations: M myopia (nearsighted); A astigmatism; H hyperopia (farsighted); P presbyopia; Mrx spectacle prescription;  CTL contact lenses; OD right eye; OS left eye; OU both eyes  XT exotropia; ET esotropia; PEK punctate epithelial keratitis; PEE punctate epithelial erosions; DES dry eye  syndrome; MGD meibomian gland dysfunction; ATs artificial tears; PFAT's preservative free artificial  tears; [name] nuclear sclerotic cataract; PSC posterior subcapsular cataract; ERM epi-retinal membrane; PVD posterior vitreous detachment; RD retinal detachment; DM diabetes mellitus; DR diabetic retinopathy; NPDR non-proliferative diabetic retinopathy; PDR proliferative diabetic retinopathy; CSME clinically significant macular edema; DME diabetic macular edema; dbh dot blot hemorrhages; CWS cotton wool spot; POAG primary open angle glaucoma; C/D cup-to-disc ratio; HVF humphrey visual field; GVF goldmann visual field; OCT optical coherence tomography; IOP intraocular pressure; BRVO Branch retinal vein occlusion; CRVO central retinal vein occlusion; CRAO central retinal artery occlusion; BRAO branch retinal artery occlusion; RT retinal tear; SB scleral buckle; PPV pars plana vitrectomy; VH Vitreous hemorrhage; PRP panretinal laser photocoagulation; IVK intravitreal kenalog; VMT vitreomacular traction; MH Macular hole;  NVD neovascularization of the disc; NVE neovascularization elsewhere; AREDS age related eye disease study; ARMD age related macular degeneration; POAG primary open angle glaucoma; EBMD epithelial/anterior basement membrane dystrophy; ACIOL anterior chamber intraocular lens; IOL intraocular lens; PCIOL posterior chamber intraocular lens; Phaco/IOL phacoemulsification with intraocular lens placement; [place] photorefractive keratectomy; LASIK laser assisted in situ keratomileusis; HTN hypertension; DM diabetes mellitus; COPD chronic obstructive pulmonary disease

## 2020-03-23 ENCOUNTER — Encounter (INDEPENDENT_AMBULATORY_CARE_PROVIDER_SITE_OTHER): Payer: Federal, State, Local not specified - PPO | Admitting: Ophthalmology

## 2020-03-28 ENCOUNTER — Ambulatory Visit (INDEPENDENT_AMBULATORY_CARE_PROVIDER_SITE_OTHER): Payer: Federal, State, Local not specified - PPO | Admitting: Ophthalmology

## 2020-03-28 ENCOUNTER — Other Ambulatory Visit: Payer: Self-pay

## 2020-03-28 ENCOUNTER — Encounter (INDEPENDENT_AMBULATORY_CARE_PROVIDER_SITE_OTHER): Payer: Self-pay | Admitting: Ophthalmology

## 2020-03-28 DIAGNOSIS — H2512 Age-related nuclear cataract, left eye: Secondary | ICD-10-CM | POA: Insufficient documentation

## 2020-03-28 DIAGNOSIS — H43822 Vitreomacular adhesion, left eye: Secondary | ICD-10-CM | POA: Insufficient documentation

## 2020-03-28 DIAGNOSIS — Z9889 Other specified postprocedural states: Secondary | ICD-10-CM | POA: Diagnosis not present

## 2020-03-28 DIAGNOSIS — H3341 Traction detachment of retina, right eye: Secondary | ICD-10-CM | POA: Diagnosis not present

## 2020-03-28 DIAGNOSIS — H43821 Vitreomacular adhesion, right eye: Secondary | ICD-10-CM | POA: Diagnosis not present

## 2020-03-28 NOTE — Assessment & Plan Note (Signed)

## 2020-03-28 NOTE — Assessment & Plan Note (Signed)
Improved  OD, status post vitrectomy small focal collection of fluid in the foveal region remains but much less intraretinal CME, is now some 5 months status post vitrectomy.  Explained the patient that continued improvement anatomically could occur up over the next year

## 2020-03-28 NOTE — Progress Notes (Signed)
03/28/2020     CHIEF COMPLAINT Patient presents for Retina Follow Up   HISTORY OF PRESENT ILLNESS: Brittney Spencer is a 69 y.o. female who presents to the clinic today for:   HPI    Retina Follow Up    Patient presents with  Other.  In both eyes.  This started 3 months ago.  Severity is mild.  Duration of 3 months.  Since onset it is gradually improving.          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  Now some 5 months status post vitrectomy and release of the traction and continued slow improvement in acuity.  Pt reports fluctuating VA OD. Pt sts VA OD overall improved. Pt c/o photophobia, and feels she needs to wear her dark glasses more often. Pt reports tired eyes OU.       Last edited by Hurman Horn, MD on 03/28/2020  2:10 PM. (History)      Referring physician: Leighton Ruff, MD [place],  [place] [address]  HISTORICAL INFORMATION:   Selected notes from the MEDICAL RECORD NUMBER       CURRENT MEDICATIONS: Current Outpatient Medications (Ophthalmic Drugs)  Medication Sig  . carboxymethylcellul-glycerin (REFRESH OPTIVE) 0.5-0.9 % ophthalmic solution 1 drop. (Patient not taking: Reported on 03/28/2020)  . erythromycin ophthalmic ointment SMARTSIG:1 CENTIMETER(S) In Eye(s) Every Night (Patient not taking: Reported on 03/28/2020)  . LOTEMAX 0.5 % ophthalmic suspension 1 drop 4 (four) times daily. (Patient not taking: Reported on 03/28/2020)   No current facility-administered medications for this visit. (Ophthalmic Drugs)   Current Outpatient Medications (Other)  Medication Sig  . ALPRAZolam (XANAX) 0.25 MG tablet Take 1 tablet (0.25 mg total) by mouth daily as needed for anxiety.  . NORVASC 2.5 MG tablet Take 1 tablet (2.5 mg total) daily by mouth.   Current Facility-Administered Medications  (Other)  Medication Route  . 0.9 %  sodium chloride infusion Intravenous      REVIEW OF SYSTEMS:    ALLERGIES Allergies  Allergen Reactions  . Fish Allergy Itching and Swelling  . Peanut-Containing Drug Products     Itching throat  . Soy Allergy Other (See Comments)    Stomach aches  . Buspirone Anxiety    PAST MEDICAL HISTORY Past Medical History:  Diagnosis Date  . AAA (abdominal aortic aneurysm) (HCC)    infrarenal 4.1 cmper s-9-19 scan on chart  . Anemia   . Anxiety   . Colon cancer ([facility])   . Hypertension   . Vitamin D deficiency    Past Surgical History:  Procedure Laterality Date  . COLONSCOPY  12/2015  . LAPAROSCOPIC RIGHT HEMI COLECTOMY Right 02/07/2016   Procedure: LAPAROSCOPIC ASSISTED RIGHT HEMI COLECTOMY AND RIGHT SALPINGO OOPHERECTOMY;  Surgeon: [name], MD;  Location: [facility];  Service: General;  Laterality: Right;    FAMILY HISTORY Family History  Problem Relation Age of Onset  . Cancer Mother        lung cancer  . Cancer Maternal Grandfather        unknown cancer   . Colon cancer Neg Hx   . Esophageal cancer Neg Hx   . Rectal cancer Neg Hx   . Stomach cancer Neg Hx     SOCIAL HISTORY Social History   Tobacco Use  . Smoking status: Never Smoker  .  Smokeless tobacco: Never Used  Vaping Use  . Vaping Use: Never used  Substance Use Topics  . Alcohol use: No    Alcohol/week: 0.0 standard drinks  . Drug use: No         OPHTHALMIC EXAM:  Base Eye Exam    Visual Acuity (ETDRS)      Right Left   Dist cc 20/40 +2 20/20 -1   Dist ph cc NI    Correction: Glasses       Tonometry (Tonopen, 1:34 PM)      Right Left   Pressure 12 12       Pupils      Pupils Dark Light Shape React APD   Right PERRL 4 3 Round Brisk None   Left PERRL 4 3 Round Brisk None       Visual Fields (Counting fingers)      Left Right    Full Full       Extraocular Movement      Right Left    Full Full       Neuro/Psych    Oriented x3:  Yes   Mood/Affect: Normal       Dilation    Both eyes: 1.0% Mydriacyl, 2.5% Phenylephrine @ 1:39 PM        Slit Lamp and Fundus Exam    External Exam      Right Left   External Normal Normal       Slit Lamp Exam      Right Left   Lids/Lashes Normal Normal   Conjunctiva/Sclera White and quiet White and quiet   Cornea Clear Clear   Anterior Chamber Deep and quiet Deep and quiet   Iris Round and reactive Round and reactive   Lens Posterior chamber intraocular lens 2+ Nuclear sclerosis   Anterior Vitreous Normal Normal       Fundus Exam      Right Left   Posterior Vitreous Vitrectomized Normal   Disc Normal Normal   C/D Ratio 0.2 0.25   Macula Normal,, macular region is now flat, no holes or tears noted,, shiny ILM, no striae. Normal   Vessels Normal Normal   Periphery Normal Normal          IMAGING AND PROCEDURES  Imaging and Procedures for 03/28/20  OCT, Retina - OU - Both Eyes       Right Eye Scan locations included subfoveal. Central Foveal Thickness: 282. Findings include subretinal fluid.   Left Eye Quality was good. Scan locations included subfoveal. Central Foveal Thickness: 289. Findings include vitreomacular adhesion .   Notes OD, much less subretinal fluid, much smaller foveal detachment, still subretinal fluid with intraretinal fluid remaining. Improved post vitrectomy April 2021  OS with minor vitreal macular adhesion and traction, no real foveal distortions                ASSESSMENT/PLAN:  Retinal traction with detachment Improved  OD, status post vitrectomy small focal collection of fluid in the foveal region remains but much less intraretinal CME, is now some 5 months status post vitrectomy.  Explained the patient that continued improvement anatomically could occur up over the next year  Vitreomacular traction, left OS minor with no foveal distortion shall observe.  Nuclear sclerotic cataract of left eye The nature of cataract was  discussed with the patient as well as the elective nature of surgery. The patient was reassured that surgery at a later date does not put the patient at risk for  a worse outcome. It was emphasized that the need for surgery is dictated by the patient's quality of life as influenced by the cataract. Patient was instructed to maintain close follow up with their general eye care doctor.      ICD-10-CM   1. 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 History of vitrectomy  Z98.890   3. Traction detachment of right retina  H33.41   4. Vitreomacular traction, left  H43.822   5. Nuclear sclerotic cataract of left eye  H25.12     1.  2.  3.  Ophthalmic Meds Ordered this visit:  No orders of the defined types were placed in this encounter.      Return in about 6 months (around 09/25/2020) for DILATE OU, OCT.  Patient Instructions  Patient asked to report any new onset visual acuity distortions or declines of either eye    Explained the diagnoses, plan, and follow up with the patient and they expressed understanding.  Patient expressed understanding of the importance of proper follow up care.   Clent Demark Kiala Faraj M.D. Diseases & Surgery of the Retina and Vitreous [facility]ina [facility]abetic [place] 03/28/20     Abbreviations: M myopia (nearsighted); A astigmatism; H hyperopia (farsighted); P presbyopia; Mrx spectacle prescription;  CTL contact lenses; OD right eye; OS left eye; OU both eyes  XT exotropia; ET esotropia; PEK punctate epithelial keratitis; PEE punctate epithelial erosions; DES dry eye syndrome; MGD meibomian gland dysfunction; ATs artificial tears; PFAT's preservative free artificial tears; [place] nuclear sclerotic cataract; PSC posterior subcapsular cataract; ERM epi-retinal membrane; PVD posterior vitreous detachment; RD retinal detachment; DM diabetes mellitus; DR diabetic retinopathy; NPDR non-proliferative diabetic retinopathy; PDR proliferative  diabetic retinopathy; CSME clinically significant macular edema; DME diabetic macular edema; dbh dot blot hemorrhages; CWS cotton wool spot; POAG primary open angle glaucoma; C/D cup-to-disc ratio; HVF humphrey visual field; GVF goldmann visual field; OCT optical coherence tomography; IOP intraocular pressure; BRVO Branch retinal vein occlusion; CRVO central retinal vein occlusion; CRAO central retinal artery occlusion; BRAO branch retinal artery occlusion; RT retinal tear; SB scleral buckle; PPV pars plana vitrectomy; VH Vitreous hemorrhage; PRP panretinal laser photocoagulation; IVK intravitreal kenalog; VMT vitreomacular traction; MH Macular hole;  NVD neovascularization of the disc; NVE neovascularization elsewhere; AREDS age related eye disease study; ARMD age related macular degeneration; POAG primary open angle glaucoma; EBMD epithelial/anterior basement membrane dystrophy; ACIOL anterior chamber intraocular lens; IOL intraocular lens; PCIOL posterior chamber intraocular lens; Phaco/IOL phacoemulsification with intraocular lens placement; [place] photorefractive keratectomy; LASIK laser assisted in situ keratomileusis; HTN hypertension; DM diabetes mellitus; COPD chronic obstructive pulmonary disease

## 2020-03-28 NOTE — Patient Instructions (Signed)
Patient asked to report any new onset visual acuity distortions or declines of either eye

## 2020-03-28 NOTE — Assessment & Plan Note (Signed)
OS minor with no foveal distortion shall observe.

## 2020-04-04 ENCOUNTER — Ambulatory Visit: Payer: Federal, State, Local not specified - PPO | Attending: Internal Medicine

## 2020-04-04 DIAGNOSIS — Z23 Encounter for immunization: Secondary | ICD-10-CM

## 2020-04-04 NOTE — Progress Notes (Signed)
   Covid-19 Vaccination Clinic  Name:  Brittney Spencer    MRN: [id number] DOB: 07/15/1950  04/04/2020  Brittney Spencer was observed post Covid-19 immunization for 15 minutes without incident. She was provided with Vaccine Information Sheet and instruction to access the V-Safe system.   Brittney Spencer was instructed to call 911 with any severe reactions post vaccine: Marland Kitchen Difficulty breathing  . Swelling of face and throat  . A fast heartbeat  . A bad rash all over body  . Dizziness and weakness

## 2020-04-17 ENCOUNTER — Encounter: Payer: Self-pay | Admitting: Gastroenterology

## 2020-05-16 ENCOUNTER — Telehealth: Payer: Self-pay | Admitting: Nurse Practitioner

## 2020-05-16 NOTE — Telephone Encounter (Signed)
Called pt per 11/15 sch msg - no answer. Left message for patient to call back if reschedule is still needed

## 2020-06-02 ENCOUNTER — Other Ambulatory Visit: Payer: Self-pay

## 2020-06-02 ENCOUNTER — Ambulatory Visit (AMBULATORY_SURGERY_CENTER): Payer: Self-pay

## 2020-06-02 ENCOUNTER — Telehealth: Payer: Self-pay

## 2020-06-02 VITALS — Ht 63.5 in | Wt 121.0 lb

## 2020-06-02 DIAGNOSIS — Z85038 Personal history of other malignant neoplasm of large intestine: Secondary | ICD-10-CM

## 2020-06-02 MED ORDER — NA SULFATE-K SULFATE-MG SULF 17.5-3.13-1.6 GM/177ML PO SOLN: 1.0000 | Freq: Once | ORAL | 0 refills | Status: AC

## 2020-06-02 NOTE — Telephone Encounter (Signed)
Dr.Danis, This patient completed her Pre Visit today for her colonoscopy procedure to be completed on 06/15/2020.  However, during her pre visit appt she requested to have an EGD as well, patient did not want to cancel her colonoscopy procedure to be scheduled for an OV due to her medical hx of colon CA. Patient is requesting to know if she can be added on for an EGD or if she needs to be scheduled for an OV for an EGD to be completed separately from the colonoscopy.  RN advised patient the office would contact her when a decision was made.  Please advise

## 2020-06-02 NOTE — Progress Notes (Signed)
No egg allergy known to patient  SOY allergy noted by allergy MD-patient reports she can eat soy and has abdominal issues but no anaphylaxis No issues with past sedation with any surgeries or procedures No intubation problems in the past  No FH of Malignant Hyperthermia No diet pills per patient No home 02 use per patient  No blood thinners per patient  Pt reports issues with constipation ---will advise to take Miralax for 5 days prior to prep No A fib or A flutter  COVID 19 guidelines implemented in [facility] today with Pt and RN  Coupon given to pt in [facility] today , Code to Pharmacy  COVID vaccines completed on 08/2019 per pt;  Due to the COVID-19 pandemic we are asking patients to follow these guidelines. Please only bring one care partner. Please be aware that your care partner may wait in the car in the parking lot or if they feel like they will be too hot to wait in the car, they may wait in the lobby on the 4th floor. All care partners are required to wear a mask the entire time (we do not have any that we can provide them), they need to practice social distancing, and we will do a Covid check for all patient's and care partners when you arrive. Also we will check their temperature and your temperature. If the care partner waits in their car they need to stay in the parking lot the entire time and we will call them on their cell phone when the patient is ready for discharge so they can bring the car to the front of the building. Also all patient's will need to wear a mask into building.

## 2020-06-04 NOTE — Telephone Encounter (Signed)
The endoscopy schedule is full that day, and I would also need to speak to her before determining if she needs an upper endoscopy.  She can see me for a colonoscopy as scheduled on 12/16 and possible EGD at a later date, or cancel upcoming colonoscopy and make clinic visit.  - HD

## 2020-06-05 ENCOUNTER — Telehealth: Payer: Self-pay | Admitting: Gastroenterology

## 2020-06-05 DIAGNOSIS — Z85038 Personal history of other malignant neoplasm of large intestine: Secondary | ICD-10-CM

## 2020-06-05 MED ORDER — NA SULFATE-K SULFATE-MG SULF 17.5-3.13-1.6 GM/177ML PO SOLN: ORAL | 0 refills | Status: DC

## 2020-06-05 NOTE — Telephone Encounter (Signed)
Suprep rx resent to pt's pharmacy. Per pt's request.  Patient notified the rx was resent to [facility].

## 2020-06-05 NOTE — Telephone Encounter (Signed)
Pt states her pharmacy hasn't received the [place],   Pharmacy: [address]

## 2020-06-05 NOTE — Telephone Encounter (Signed)
Follow up office visit has been scheduled for 08-01-2020. She would like to keep her colonoscopy as scheduled.

## 2020-06-15 ENCOUNTER — Other Ambulatory Visit: Payer: Self-pay

## 2020-06-15 ENCOUNTER — Encounter: Payer: Self-pay | Admitting: Gastroenterology

## 2020-06-15 ENCOUNTER — Other Ambulatory Visit: Payer: Self-pay | Admitting: Gastroenterology

## 2020-06-15 ENCOUNTER — Ambulatory Visit (AMBULATORY_SURGERY_CENTER): Payer: Federal, State, Local not specified - PPO | Admitting: Gastroenterology

## 2020-06-15 ENCOUNTER — Other Ambulatory Visit (INDEPENDENT_AMBULATORY_CARE_PROVIDER_SITE_OTHER): Payer: Federal, State, Local not specified - PPO

## 2020-06-15 VITALS — BP 127/73 | HR 78 | Temp 97.1°F | Resp 15 | Ht 63.0 in | Wt 121.0 lb

## 2020-06-15 DIAGNOSIS — K6289 Other specified diseases of anus and rectum: Secondary | ICD-10-CM | POA: Diagnosis not present

## 2020-06-15 DIAGNOSIS — C20 Malignant neoplasm of rectum: Secondary | ICD-10-CM | POA: Diagnosis not present

## 2020-06-15 DIAGNOSIS — D123 Benign neoplasm of transverse colon: Secondary | ICD-10-CM

## 2020-06-15 DIAGNOSIS — K635 Polyp of colon: Secondary | ICD-10-CM

## 2020-06-15 DIAGNOSIS — Z85038 Personal history of other malignant neoplasm of large intestine: Secondary | ICD-10-CM

## 2020-06-15 LAB — COMPREHENSIVE METABOLIC PANEL
ALT: 9 U/L (ref 0–35)
AST: 18 U/L (ref 0–37)
Albumin: 3.9 g/dL (ref 3.5–5.2)
Alkaline Phosphatase: 63 U/L (ref 39–117)
BUN: 6 mg/dL (ref 6–23)
CO2: 25 mEq/L (ref 19–32)
Calcium: 9 mg/dL (ref 8.4–10.5)
Chloride: 100 mEq/L (ref 96–112)
Creatinine, Ser: 0.72 mg/dL (ref 0.40–1.20)
GFR: 84.24 mL/min (ref >60.00)
Glucose, Bld: 85 mg/dL (ref 70–99)
Potassium: 4 mEq/L (ref 3.5–5.1)
Sodium: 134 mEq/L — ABNORMAL LOW (ref 135–145)
Total Bilirubin: 0.6 mg/dL (ref 0.2–1.2)
Total Protein: 7 g/dL (ref 6.0–8.3)

## 2020-06-15 LAB — CBC WITH DIFFERENTIAL/PLATELET
Basophils Absolute: 0 10*3/uL (ref 0.0–0.1)
Basophils Relative: 1 % (ref 0.0–3.0)
Eosinophils Absolute: 0.1 10*3/uL (ref 0.0–0.7)
Eosinophils Relative: 4.1 % (ref 0.0–5.0)
HCT: 33.6 % — ABNORMAL LOW (ref 36.0–46.0)
Hemoglobin: 10.9 g/dL — ABNORMAL LOW (ref 12.0–15.0)
Lymphocytes Relative: 34.7 % (ref 12.0–46.0)
Lymphs Abs: 0.9 10*3/uL (ref 0.7–4.0)
MCHC: 32.4 g/dL (ref 30.0–36.0)
MCV: 83.2 fl (ref 78.0–100.0)
Monocytes Absolute: 0.3 10*3/uL (ref 0.1–1.0)
Monocytes Relative: 9.8 % (ref 3.0–12.0)
Neutro Abs: 1.4 10*3/uL (ref 1.4–7.7)
Neutrophils Relative %: 50.4 % (ref 43.0–77.0)
Platelets: 198 10*3/uL (ref 150.0–400.0)
RBC: 4.04 Mil/uL (ref 3.87–5.11)
RDW: 14.4 % (ref 11.5–15.5)
WBC: 2.7 10*3/uL — ABNORMAL LOW (ref 4.0–10.5)

## 2020-06-15 MED ORDER — SODIUM CHLORIDE 0.9 % IV SOLN: 500.0000 mL | Freq: Once | INTRAVENOUS | Status: DC

## 2020-06-15 NOTE — Patient Instructions (Signed)
Impression/Recommendations:  Polyp handout given to patient.  Contrast and CT schedule sent home with pt. Resume previous diet. Continue present medications. Await pathology results.  Perform CT scan of chest with contrast, abdomen with contrast and pelvis with contrast at appointment to be scheduled.  YOU HAD AN ENDOSCOPIC PROCEDURE TODAY AT [facility]:   Refer to the procedure report that was given to you for any specific questions about what was found during the examination.  If the procedure report does not answer your questions, please call your gastroenterologist to clarify.  If you requested that your care partner not be given the details of your procedure findings, then the procedure report has been included in a sealed envelope for you to review at your convenience later.  YOU SHOULD EXPECT: Some feelings of bloating in the abdomen. Passage of more gas than usual.  Walking can help get rid of the air that was put into your GI tract during the procedure and reduce the bloating. If you had a lower endoscopy (such as a colonoscopy or flexible sigmoidoscopy) you may notice spotting of blood in your stool or on the toilet paper. If you underwent a bowel prep for your procedure, you may not have a normal bowel movement for a few days.  Please Note:  You might notice some irritation and congestion in your nose or some drainage.  This is from the oxygen used during your procedure.  There is no need for concern and it should clear up in a day or so.  SYMPTOMS TO REPORT IMMEDIATELY:   Following lower endoscopy (colonoscopy or flexible sigmoidoscopy):  Excessive amounts of blood in the stool  Significant tenderness or worsening of abdominal pains  Swelling of the abdomen that is new, acute  Fever of 100F or higher For urgent or emergent issues, a gastroenterologist can be reached at any hour by calling [phone]. Do not use MyChart messaging for urgent concerns.     DIET:  We do recommend a small meal at first, but then you may proceed to your regular diet.  Drink plenty of fluids but you should avoid alcoholic beverages for 24 hours.  ACTIVITY:  You should plan to take it easy for the rest of today and you should NOT DRIVE or use heavy machinery until tomorrow (because of the sedation medicines used during the test).    FOLLOW UP: Our staff will call the number listed on your records 48-72 hours following your procedure to check on you and address any questions or concerns that you may have regarding the information given to you following your procedure. If we do not reach you, we will leave a message.  We will attempt to reach you two times.  During this call, we will ask if you have developed any symptoms of COVID 19. If you develop any symptoms (ie: fever, flu-like symptoms, shortness of breath, cough etc.) before then, please call [phone].  If you test positive for Covid 19 in the 2 weeks post procedure, please call and report this information to [place].    If any biopsies were taken you will be contacted by phone or by letter within the next 1-3 weeks.  Please call us at [phone] if you have not heard about the biopsies in 3 weeks.    SIGNATURES/CONFIDENTIALITY: You and/or your care partner have signed paperwork which will be entered into your electronic medical record.  These signatures attest to the fact that that the information above on  your After Visit Summary has been reviewed and is understood.  Full responsibility of the confidentiality of this discharge information lies with you and/or your care-partner. 

## 2020-06-15 NOTE — Op Note (Signed)
[facility] Patient Name: Brittney Spencer Procedure Date: 06/15/2020 8:46 AM MRN: [id number] Endoscopist: Mallie Mussel L. [name] , MD Age: 69 Referring MD:  Date of Birth: Feb 06, 1951 Gender: Female Account #: [id number] Procedure:                Colonoscopy Indications:              High risk colon cancer surveillance: Personal                            history of colon cancer Medicines:                Monitored Anesthesia Care Procedure:                Pre-Anesthesia Assessment:                           - Prior to the procedure, a History and Physical                            was performed, and patient medications and                            allergies were reviewed. The patient's tolerance of                            previous anesthesia was also reviewed. The risks                            and benefits of the procedure and the sedation                            options and risks were discussed with the patient.                            All questions were answered, and informed consent                            was obtained. Prior Anticoagulants: The patient has                            taken no previous anticoagulant or antiplatelet                            agents. ASA Grade Assessment: III - A patient with                            severe systemic disease. After reviewing the risks                            and benefits, the patient was deemed in                            satisfactory condition to undergo the procedure.  After obtaining informed consent, the colonoscope                            was passed under direct vision. Throughout the                            procedure, the patient's blood pressure, pulse, and                            oxygen saturations were monitored continuously. The                            Olympus PCF-H190DL (#[id number]) Colonoscope was                            introduced through the anus and  advanced to the the                            ileocolonic anastomosis. The colonoscopy was                            performed without difficulty. The patient tolerated                            the procedure well. The quality of the bowel                            preparation was good. The rectum and Neo-terminal                            ileum and ileo-colonic anastomosis were                            photographed. Scope In: 8:56:28 AM Scope Out: 9:24:34 AM Scope Withdrawal Time: 0 hours 22 minutes 58 seconds  Total Procedure Duration: 0 hours 28 minutes 6 seconds  Findings:                 The digital rectal exam findings include decreased                            sphincter tone and internal hemorrhoids that                            prolapse with straining, but require manual                            replacement into the anal canal (Grade III).                           There was evidence of a prior side-to-side                            ileo-colonic anastomosis in the proximal transverse  colon. This was patent and was characterized by                            healthy appearing mucosa. The anastomosis was                            traversed.                           The neo-terminal ileum appeared normal.                           A diminutive polyp was found in the proximal                            transverse colon. The polyp was flat. The polyp was                            removed with a cold biopsy forceps. Resection and                            retrieval were complete.                           A fungating partially obstructing mass was found in                            the mid rectum. The mass was partially                            circumferential (involving one-half of the lumen                            circumference). The distal aspect was just above                            the middle rectal fold. No bleeding was  present.                            This was biopsied with a cold forceps for                            histology. Area was tattooed with an injection of 2                            mL of Spot (0.5 ml on opposite walls proximal and                            distal to the mass).                           The exam was otherwise without abnormality on  direct and retroflexion views. Complications:            No immediate complications. Estimated Blood Loss:     Estimated blood loss was minimal. Impression:               - Decreased sphincter tone and internal hemorrhoids                            that prolapse with straining, but require manual                            replacement into the anal canal (Grade III) found                            on digital rectal exam.                           - Patent side-to-side ileo-colonic anastomosis,                            characterized by healthy appearing mucosa.                           - The examined portion of the ileum was normal.                           - One diminutive polyp in the proximal transverse                            colon, removed with a cold biopsy forceps. Resected                            and retrieved.                           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 Biopsied. Tattooed.                           - The examination was otherwise normal on direct                            and retroflexion views. Recommendation:           - Patient has a contact number available for                            emergencies. The signs and symptoms of potential                            delayed complications were discussed with the                            patient. Return to normal activities tomorrow.  Written discharge instructions were provided to the                            patient.                            - Resume previous diet.                           - Continue present medications.                           - Await pathology results.                           - CBC, CMP, CEA today                           - Perform a CT scan (computed tomography) of chest                            with contrast, abdomen with contrast and pelvis                            with contrast at appointment to be scheduled. Tyger Wichman L. [name], MD 06/15/2020 9:34:08 AM This report has been signed electronically. CC Letter to:             [name], MD

## 2020-06-15 NOTE — Progress Notes (Signed)
Report given to PACU, vss 

## 2020-06-15 NOTE — Progress Notes (Signed)
Called to room to assist during endoscopic procedure.  Patient ID and intended procedure confirmed with present staff. Received instructions for my participation in the procedure from the performing physician.  Pathology sent RUSH per DO

## 2020-06-16 ENCOUNTER — Other Ambulatory Visit: Payer: Self-pay

## 2020-06-16 ENCOUNTER — Telehealth: Payer: Self-pay

## 2020-06-16 DIAGNOSIS — D123 Benign neoplasm of transverse colon: Secondary | ICD-10-CM

## 2020-06-16 DIAGNOSIS — K6289 Other specified diseases of anus and rectum: Secondary | ICD-10-CM

## 2020-06-16 DIAGNOSIS — Z85038 Personal history of other malignant neoplasm of large intestine: Secondary | ICD-10-CM

## 2020-06-16 LAB — CEA: CEA: 8.8 ng/mL — ABNORMAL HIGH

## 2020-06-16 NOTE — Telephone Encounter (Signed)
CT scan is scheduled at [facility] on Wednesday at 7:30 AM, arrival time 7:15 AM. NPO 4 hours prior. Patient to pick up contrast, drink 1st bottle at 5:30 AM and 2nd bottle at 6:30 AM.   Lm on vm for patient to return call for appointment information. Will mail letter as well.

## 2020-06-19 ENCOUNTER — Telehealth: Payer: Self-pay | Admitting: *Deleted

## 2020-06-19 NOTE — Telephone Encounter (Signed)
Spoke with patient in regards to her CT scan appointment information. Pt states that she was already given the contrast. Pt verbalized understanding of all information. Pt states that she apologizes for missing multiple calls from our office.   Dr. [name], patient states that she is available to speak anytime today. Please call her at [phone]. Thank you.

## 2020-06-19 NOTE — Telephone Encounter (Signed)
°  Follow up Call-  Call back number 06/15/2020  Post procedure Call Back phone  # [phone]  Permission to leave phone message Yes  Some recent data might be hidden     Patient questions:  Do you have a fever, pain , or abdominal swelling? No. Pain Score  0 *  Have you tolerated food without any problems? Yes.    Have you been able to return to your normal activities? Yes.    Do you have any questions about your discharge instructions: Diet   No. Medications  No. Follow up visit  No.  Do you have questions or concerns about your Care? No.  Actions: * If pain score is 4 or above: No action needed, pain <4.  1. Have you developed a fever since your procedure? NO  2.   Have you had an respiratory symptoms (SOB or cough) since your procedure? NO  3.   Have you tested positive for COVID 19 since your procedure NO  4.   Have you had any family members/close contacts diagnosed with the COVID 19 since your procedure?  NO   If yes to any of these questions please route to Joylene John, RN and Joella Prince, RN

## 2020-06-19 NOTE — Telephone Encounter (Addendum)
Brittney Spencer,  I spoke with Brittney Spencer today regarding the rectal biopsies confirming that the mass is cancer.  She is aware of her CT scan appointment for 06/28/2020 and answer some questions regarding that.  She is also aware that I will be out of the office next week and will contact her with the results of the scan the following week.  I sent a copy of the pathology report and a result note to Dr. Burr Medico, but I believe she may be out of town this week.  Brittney Spencer needs an appointment to see Dr. Burr Medico or one of their physician extenders soon.  I copied this to Brittney Rue, Brittney Spencer, with whom the patient has a current follow-up appointment for February.  Naturally, this new rectal cancer diagnosis will warrant a sooner appointment. Please get in touch with the cancer coordinator in case this other provider is also out of town this week with the holiday.  Brittney Spencer,  This is a patient of Dr.Feng with previous history of right-sided colon cancer.  On surveillance colonoscopy last week she was found to have a new rectal cancer.  She will need follow-up soon with one of you. Thanks very much.  Brittney Lund, MD Brittney Spencer Brittney Spencer,  No path letter needed. Recall colonoscopy 1 year.  - HD

## 2020-06-20 NOTE — Telephone Encounter (Signed)
Dr. [name], thank you for the heads up, we will see her after staging work up and keep you in the loop.  Regan Rakers, NP

## 2020-06-20 NOTE — Telephone Encounter (Signed)
[name], RN  Yevette Edwards, RN [place],  I left the patient a message to call me back. I have scheduled her for 1/4 at 1:15 that way we will have her CT scan results from 12/29 as well.

## 2020-06-20 NOTE — Progress Notes (Signed)
Received call back from patient confirming that she will come for consult on 07/04/2020 to arrive by 1:00 pm.

## 2020-06-20 NOTE — Telephone Encounter (Signed)
Message has been sent to nurse navigator for Oncology. Will await response.

## 2020-06-20 NOTE — Progress Notes (Signed)
Left voice message for patient to return my call regarding referral we received from Dr. [name].  I have scheduled her to see Cira Rue NP and Dr. [name] on Tuesday 07/04/2020 at 1:15 as she is having staging CT scans on 06/28/2020.   I have asked the patient to call me back on my direct line to confirm.

## 2020-06-28 ENCOUNTER — Encounter (HOSPITAL_COMMUNITY): Payer: Self-pay

## 2020-06-28 ENCOUNTER — Telehealth: Payer: Self-pay | Admitting: Gastroenterology

## 2020-06-28 ENCOUNTER — Other Ambulatory Visit: Payer: Self-pay

## 2020-06-28 ENCOUNTER — Ambulatory Visit (HOSPITAL_COMMUNITY)
Admission: RE | Admit: 2020-06-28 | Discharge: 2020-06-28 | Disposition: A | Discharge status: Home / Self Care | Payer: Federal, State, Local not specified - PPO | Source: Ambulatory Visit | Attending: Gastroenterology | Admitting: Gastroenterology

## 2020-06-28 DIAGNOSIS — K6289 Other specified diseases of anus and rectum: Secondary | ICD-10-CM | POA: Diagnosis present

## 2020-06-28 DIAGNOSIS — Z85038 Personal history of other malignant neoplasm of large intestine: Secondary | ICD-10-CM | POA: Diagnosis present

## 2020-06-28 DIAGNOSIS — D123 Benign neoplasm of transverse colon: Secondary | ICD-10-CM | POA: Diagnosis present

## 2020-06-28 MED ORDER — IOHEXOL 300 MG/ML  SOLN
100.0000 mL | Freq: Once | INTRAMUSCULAR | Status: AC | PRN
  Administered 2020-06-28: 100 mL via INTRAVENOUS

## 2020-06-28 NOTE — Telephone Encounter (Signed)
Because the CT report came to me as doctor of the day, Dr. [name] being away for the remainder of the week, and with the indication for scan being malignancy, I called the patient at home this afternoon. I let her know that I had seen the results of the CT scan.  She reported to me that she has a follow-up appointment with oncology, Dr. Mosetta Putt next Tuesday.  It is the patient's preference to wait until this oncology appointment to review the findings of the scan in person with Dr. Mosetta Putt and to discuss treatment options at that time. Thus, we did not discuss the results today but she thanked me for the call.  I made her aware that she could contact our office if any questions arise in the meantime. CC Dr. [name]

## 2020-06-28 NOTE — Telephone Encounter (Signed)
Jane from Radiology is calling with a call report on the pt's CT scan. Caller would like a call back ASAP  CB [phone]

## 2020-06-28 NOTE — Telephone Encounter (Signed)
Dr. Rhea Belton as DOD on 06/28/20  Spoke with Erskine Squibb at [facility], call report on CT chest/abdomen/pelvis from today - Dr. [name] patient with a history of colon cancer, rectal mass on recent colonoscopy  IMPRESSION: 1. New low-density focus in the anterior aspect of the lateral segment LEFT hepatic lobe measuring 1.2 x 1.0 cm, compatible with small metastatic lesion in the LEFT hepatic lobe. 2. Soft tissue in the RIGHT iliac fossa following RIGHT hemicolectomy invades the psoas musculature and is slowly enlarging over time, more linear on the prior study now highly concerning for recurrence/metastasis to this location. 3. Signs of enteritis, potentially post radiation changes of the small bowel. Tethered small bowel in the RIGHT lower quadrant shows focal thickening and narrowing suspicious for small bowel involvement and developing partial obstruction though currently contrast passes beyond this point into the colon. 4. Rectal thickening in this patient with known rectal mass as described. 5. No evidence of metastatic disease in the chest. 6. Stable small pulmonary nodules. 7.  and aortic atherosclerosis.

## 2020-07-02 NOTE — Telephone Encounter (Signed)
Brittney Spencer,  Thank you for attending to this while I was away. ________________  Brittney Spencer,   I sent you and your team a message about this mutual patient after her recent colonoscopy.  She is seeing you Jan 4th. Will need MDC review as well.  - H. Danis

## 2020-07-02 NOTE — Telephone Encounter (Signed)
Dr. [name],  Thanks for the heads-up. I will see her on 07/04/2020 as scheduled.   Elnita Maxwell, please add her to GI tumor board on 1/5, full discussion, thanks   [name] MD

## 2020-07-03 NOTE — Progress Notes (Deleted)
Brittney Spencer   [phone] Fax:([phone]) [phone]   Clinic Follow up Note   Patient Care Team: Patient, No Pcp Per as PCP - General (General Practice) [name], NP as Nurse Practitioner (Oncology) [name], MD as Consulting Physician (Oncology) [name], RN as Oncology Nurse Navigator 07/03/2020  CHIEF COMPLAINT:   SUMMARY OF ONCOLOGIC HISTORY: Oncology History Overview Note  Cancer of ascending colon [facility])   Staging form: Colon and Rectum, AJCC 7th Edition   - Clinical stage from 02/07/2016: Stage IIIC (T4b, N1b, M0) - Signed by [name], MD on 03/04/2016    Cancer of ascending colon ([place])  10/25/2015 Imaging   CT ABD/PELVIS:  Inflammatory changes inferior to the cecal tip appear improved, there is still irregular soft tissue thickening of the cecal tip, and there are adjacent prominent lymph nodes in the ileocolonic mesentery, measuring 13 mm on image 49 and 8 mm on image 52. In addition, there is a 2.5 x 1.8 cm nodule on image 46 which has central low density. Therefore, these findings are moderately suspicious for an underlying cecal malignancy with perforation.    01/19/2016 Procedure   COLONOSCOPY per Dr. [name]: Fungating, ulcerated mass almost obstructing mid ascending colon   01/19/2016 Initial Biopsy   Diagnosis Surgical [P], cecal mass - INVASIVE ADENOCARCINOMA WITH ULCERATION. - SEE COMMENT.   02/05/2016 Tumor Marker   Patient's tumor was tested for the following markers: CEA Results of the tumor marker test revealed 5.7.   02/07/2016 Initial Diagnosis   Cancer of ascending colon ([place])   02/07/2016 Definitive Surgery   Laparoscopic assisted right hemicolectomy and right salpingo oopherectomy--Dr. [name]   02/07/2016 Pathologic Stage   p T4 N1b   2/43 nodes +   02/07/2016 Pathology Results   MMR normal; G2 adenocarcinoma;proximal & distal margins negative; soft tissue mass on pelvic sidewall + for adenocarcinoma with positive  margin MSI Stable   03/08/2016 Imaging   CT chest negative for metastasis.    03/19/2016 - 04/25/2016 Radiation Therapy   Adjuvant irradiation, 50 gray in 28 fractions   03/19/2016 - 04/22/2016 Chemotherapy   Xeloda 1500 mg twice daily, started on 03/19/2016, dose reduced to 1000 mg twice daily from week 3 due to neutropenia, and patient stopped 3 days before last dose radiation due to difficulty swallowing the pill    05/20/2016 -  Adjuvant Chemotherapy   Patient declined adjuvant chemotherapy   09/16/2016 Imaging   CT CAP w Contrast 1. No evidence of local tumor recurrence at the ileocolic anastomosis. 2. No findings suspicious for metastatic disease in the chest, abdomen or pelvis. 3. Nonspecific trace free fluid in the pelvic cul-de-sac. 4. Stable solitary 3 mm right upper lobe pulmonary nodule, for which 6 month stability has been demonstrated, probably benign. 5. Additional findings include stable right posterior pericardial cyst and small calcified uterine fibroids.   05/13/2017 Imaging   CT CAP W Contrast 05/13/17 IMPRESSION: 1. No current findings of residual or recurrent malignancy. 2. Mild prominence of stool throughout the colon. Nondistended portions of the rectum. 3. Several tiny pulmonary nodules are stable from the earliest available comparison of 03/08/2016 and probably benign, but may merit surveillance. 4. Other imaging findings of potential clinical significance: Old granulomatous disease. Aortoiliac atherosclerotic vascular disease. Lumbar spondylosis and degenerative disc disease. Stable amount of trace free pelvic fluid.   04/27/2018 Imaging   04/27/2018 CT CAP IMPRESSION: Stable exam. No evidence of recurrent or metastatic carcinoma within the chest, abdomen, or  pelvis   04/19/2019 Imaging   CT CAP W Contrast  IMPRESSION: Chest Impression:   1. No evidence of thoracic metastasis. 2. Stable small bilateral pulmonary nodules.   Abdomen / Pelvis  Impression:   1. No evidence local colorectal carcinoma recurrence or metastasis in the abdomen pelvis. 2. Post RIGHT hemicolectomy.     CURRENT THERAPY:   INTERVAL HISTORY:   REVIEW OF SYSTEMS:   Constitutional: Denies fevers, chills or abnormal weight loss Eyes: Denies blurriness of vision Ears, nose, mouth, throat, and face: Denies mucositis or sore throat Respiratory: Denies cough, dyspnea or wheezes Cardiovascular: Denies palpitation, chest discomfort or lower extremity swelling Gastrointestinal:  Denies nausea, heartburn or change in bowel habits Skin: Denies abnormal skin rashes Lymphatics: Denies new lymphadenopathy or easy bruising Neurological:Denies numbness, tingling or new weaknesses Behavioral/Psych: Mood is stable, no new changes  All other systems were reviewed with the patient and are negative.  MEDICAL HISTORY:  Past Medical History:  Diagnosis Date  . AAA (abdominal aortic aneurysm) (HCC)    infrarenal 4.1 cmper s-9-19 scan on chart  . Anemia    hx of  . Anxiety    has PRN meds  . Colon cancer ([place]) 2017   RIGHT hemi colectomy-s/p sx  . GERD (gastroesophageal reflux disease)    OTC meds/diet control  . Hypertension    on meds  . Retinal detachment, right 09/2019  . Vitamin D deficiency     SURGICAL HISTORY: Past Surgical History:  Procedure Laterality Date  . COLONOSCOPY  2018   [facility]  . COLONSCOPY  12/2015  . LAPAROSCOPIC RIGHT HEMI COLECTOMY Right 02/07/2016   Procedure: LAPAROSCOPIC ASSISTED RIGHT HEMI COLECTOMY AND RIGHT SALPINGO OOPHERECTOMY;  Surgeon: [name], MD;  Location: [facility];  Service: General;  Laterality: Right;  . RETINAL DETACHMENT SURGERY  09/2019    I have reviewed the social history and family history with the patient and they are unchanged from previous note.  ALLERGIES:  is allergic to fish allergy, peanut-containing drug products, soy allergy, and buspirone.  MEDICATIONS:  Current Outpatient Medications   Medication Sig Dispense Refill  . ALPRAZolam (XANAX) 0.25 MG tablet Take 1 tablet (0.25 mg total) by mouth daily as needed for anxiety. 30 tablet 0  . Cholecalciferol (VITAMIN D3 PO) Take by mouth daily.    . NORVASC 2.5 MG tablet Take 1 tablet (2.5 mg total) daily by mouth. 30 tablet 3   Current Facility-Administered Medications  Medication Dose Route Frequency Provider Last Rate Last Admin  . 0.9 %  sodium chloride infusion  500 mL Intravenous Once Danis, Kirke Corin, MD        PHYSICAL EXAMINATION: ECOG PERFORMANCE STATUS: {CHL ONC ECOG PS:[id number]}  There were no vitals filed for this visit. There were no vitals filed for this visit.  GENERAL:alert, no distress and comfortable SKIN: skin color, texture, turgor are normal, no rashes or significant lesions EYES: normal, Conjunctiva are pink and non-injected, sclera clear OROPHARYNX:no exudate, no erythema and lips, buccal mucosa, and tongue normal  NECK: supple, thyroid normal size, non-tender, without nodularity LYMPH:  no palpable lymphadenopathy in the cervical, axillary or inguinal LUNGS: clear to auscultation and percussion with normal breathing effort HEART: regular rate & rhythm and no murmurs and no lower extremity edema ABDOMEN:abdomen soft, non-tender and normal bowel sounds Musculoskeletal:no cyanosis of digits and no clubbing  NEURO: alert & oriented x 3 with fluent speech, no focal motor/sensory deficits  LABORATORY DATA:  I have reviewed the data as  listed CBC Latest Ref Rng & Units 06/15/2020 08/18/2019 04/19/2019  WBC 4.0 - 10.5 K/uL 2.7 Repeated and verified X2.(L) 2.7(L) 2.6(L)  Hemoglobin 12.0 - 15.0 g/dL 10.9(L) 11.6(L) 11.2(L)  Hematocrit 36.0 - 46.0 % 33.6(L) 36.7 35.6(L)  Platelets 150.0 - 400.0 K/uL 198.0 181 154     CMP Latest Ref Rng & Units 06/15/2020 08/18/2019 04/19/2019  Glucose 70 - 99 mg/dL 85 105(H) 94  BUN 6 - 23 mg/dL '6 9 13  ' Creatinine 0.40 - 1.20 mg/dL 0.72 0.89 0.92  Sodium 135 - 145  mEq/L 134(L) 142 142  Potassium 3.5 - 5.1 mEq/L 4.0 4.0 3.6  Chloride 96 - 112 mEq/L 100 106 103  CO2 19 - 32 mEq/L '25 28 27  ' Calcium 8.4 - 10.5 mg/dL 9.0 9.2 9.6  Total Protein 6.0 - 8.3 g/dL 7.0 7.2 7.4  Total Bilirubin 0.2 - 1.2 mg/dL 0.6 0.4 0.4  Alkaline Phos 39 - 117 U/L 63 58 55  AST 0 - 37 U/L '18 16 18  ' ALT 0 - 35 U/L '9 11 9      ' RADIOGRAPHIC STUDIES: I have personally reviewed the radiological images as listed and agreed with the findings in the report. No results found.   ASSESSMENT & PLAN:  No problem-specific Assessment & Plan notes found for this encounter.   No orders of the defined types were placed in this encounter.  All questions were answered. The patient knows to call the clinic with any problems, questions or concerns. No barriers to learning was detected. I spent {CHL ONC TIME VISIT - ITUYW:[id number]} counseling the patient face to face. The total time spent in the appointment was {CHL ONC TIME VISIT - AFOAD:[id number]} and more than 50% was on counseling and review of test results     [name], NP 07/03/20

## 2020-07-04 ENCOUNTER — Other Ambulatory Visit: Payer: Self-pay

## 2020-07-04 ENCOUNTER — Inpatient Hospital Stay: Payer: Federal, State, Local not specified - PPO | Attending: Nurse Practitioner | Admitting: Hematology

## 2020-07-04 DIAGNOSIS — C20 Malignant neoplasm of rectum: Secondary | ICD-10-CM | POA: Diagnosis not present

## 2020-07-04 DIAGNOSIS — F419 Anxiety disorder, unspecified: Secondary | ICD-10-CM | POA: Diagnosis not present

## 2020-07-04 DIAGNOSIS — C787 Secondary malignant neoplasm of liver and intrahepatic bile duct: Secondary | ICD-10-CM

## 2020-07-04 DIAGNOSIS — Z923 Personal history of irradiation: Secondary | ICD-10-CM | POA: Insufficient documentation

## 2020-07-04 DIAGNOSIS — D649 Anemia, unspecified: Secondary | ICD-10-CM | POA: Diagnosis not present

## 2020-07-04 DIAGNOSIS — Z79899 Other long term (current) drug therapy: Secondary | ICD-10-CM | POA: Insufficient documentation

## 2020-07-04 DIAGNOSIS — C182 Malignant neoplasm of ascending colon: Secondary | ICD-10-CM | POA: Insufficient documentation

## 2020-07-04 DIAGNOSIS — Z9221 Personal history of antineoplastic chemotherapy: Secondary | ICD-10-CM | POA: Diagnosis not present

## 2020-07-04 DIAGNOSIS — I10 Essential (primary) hypertension: Secondary | ICD-10-CM | POA: Insufficient documentation

## 2020-07-04 NOTE — Progress Notes (Signed)
[name]   [phone] [phone]   Clinic Follow up Note   Patient Care Team: Patient, No Pcp Per as PCP - General (General Practice) [name], NP as Nurse Practitioner (Oncology) [name], MD as Consulting Physician (Oncology) [name], RN as Oncology Nurse Navigator  Date of Service:  07/04/2020  CHIEF COMPLAINT: Follow up stage IIIC right colon cancer  SUMMARY OF ONCOLOGIC HISTORY: Oncology History Overview Note  Cancer of ascending colon [facility])   Staging form: Colon and Rectum, AJCC 7th Edition   - Clinical stage from 02/07/2016: Stage IIIC (T4b, N1b, M0) - Signed by [name], MD on 03/04/2016    Cancer of ascending colon ([place])  10/25/2015 Imaging   CT ABD/PELVIS:  Inflammatory changes inferior to the cecal tip appear improved, there is still irregular soft tissue thickening of the cecal tip, and there are adjacent prominent lymph nodes in the ileocolonic mesentery, measuring 13 mm on image 49 and 8 mm on image 52. In addition, there is a 2.5 x 1.8 cm nodule on image 46 which has central low density. Therefore, these findings are moderately suspicious for an underlying cecal malignancy with perforation.    01/19/2016 Procedure   COLONOSCOPY per Dr. [name]: Fungating, ulcerated mass almost obstructing mid ascending colon   01/19/2016 Initial Biopsy   Diagnosis Surgical [P], cecal mass - INVASIVE ADENOCARCINOMA WITH ULCERATION. - SEE COMMENT.   02/05/2016 Tumor Marker   Patient's tumor was tested for the following markers: CEA Results of the tumor marker test revealed 5.7.   02/07/2016 Initial Diagnosis   Cancer of ascending colon ([place])   02/07/2016 Definitive Surgery   Laparoscopic assisted right hemicolectomy and right salpingo oopherectomy--Dr. [name]   02/07/2016 Pathologic Stage   p T4 N1b   2/43 nodes +   02/07/2016 Pathology Results   MMR normal; G2 adenocarcinoma;proximal & distal margins negative; soft tissue  mass on pelvic sidewall + for adenocarcinoma with positive margin MSI Stable   03/08/2016 Imaging   CT chest negative for metastasis.    03/19/2016 - 04/25/2016 Radiation Therapy   Adjuvant irradiation, 50 gray in 28 fractions   03/19/2016 - 04/22/2016 Chemotherapy   Xeloda 1500 mg twice daily, started on 03/19/2016, dose reduced to 1000 mg twice daily from week 3 due to neutropenia, and patient stopped 3 days before last dose radiation due to difficulty swallowing the pill    05/20/2016 -  Adjuvant Chemotherapy   Patient declined adjuvant chemotherapy   09/16/2016 Imaging   CT CAP w Contrast 1. No evidence of local tumor recurrence at the ileocolic anastomosis. 2. No findings suspicious for metastatic disease in the chest, abdomen or pelvis. 3. Nonspecific trace free fluid in the pelvic cul-de-sac. 4. Stable solitary 3 mm right upper lobe pulmonary nodule, for which 6 month stability has been demonstrated, probably benign. 5. Additional findings include stable right posterior pericardial cyst and small calcified uterine fibroids.   05/13/2017 Imaging   CT CAP W Contrast 05/13/17 IMPRESSION: 1. No current findings of residual or recurrent malignancy. 2. Mild prominence of stool throughout the colon. Nondistended portions of the rectum. 3. Several tiny pulmonary nodules are stable from the earliest available comparison of 03/08/2016 and probably benign, but may merit surveillance. 4. Other imaging findings of potential clinical significance: Old granulomatous disease. Aortoiliac atherosclerotic vascular disease. Lumbar spondylosis and degenerative disc disease. Stable amount of trace free pelvic fluid.   04/27/2018 Imaging   04/27/2018 CT CAP IMPRESSION: Stable exam. No  evidence of recurrent or metastatic carcinoma within the chest, abdomen, or pelvis   04/19/2019 Imaging   CT CAP W Contrast  IMPRESSION: Chest Impression:   1. No evidence of thoracic metastasis. 2.  Stable small bilateral pulmonary nodules.   Abdomen / Pelvis Impression:   1. No evidence local colorectal carcinoma recurrence or metastasis in the abdomen pelvis. 2. Post RIGHT hemicolectomy.      CURRENT THERAPY:  Surveillance  INTERVAL HISTORY:  Brittney Spencer is here for a follow up due to her recently diangnosed rectal cancer She has mild constipation before screening colonoscopy, no bleedibng, pain or other symptoms  She denies any pain, appetite and energy level are good    All other systems were reviewed with the patient and are negative.  MEDICAL HISTORY:  Past Medical History:  Diagnosis Date  . AAA (abdominal aortic aneurysm) ([facility])    infrarenal 4.1 cmper s-9-19 scan on chart  . Anemia    hx of  . Anxiety    has PRN meds  . Colon cancer ([place]) 2017   RIGHT hemi colectomy-s/p sx  . GERD (gastroesophageal reflux disease)    OTC meds/diet control  . Hypertension    on meds  . Retinal detachment, right 09/2019  . Vitamin D deficiency     SURGICAL HISTORY: Past Surgical History:  Procedure Laterality Date  . COLONOSCOPY  2018   HD-hams  . COLONSCOPY  12/2015  . LAPAROSCOPIC RIGHT HEMI COLECTOMY Right 02/07/2016   Procedure: LAPAROSCOPIC ASSISTED RIGHT HEMI COLECTOMY AND RIGHT SALPINGO OOPHERECTOMY;  Surgeon: [name], MD;  Location: [facility];  Service: General;  Laterality: Right;  . RETINAL DETACHMENT SURGERY  09/2019    I have reviewed the social history and family history with the patient and they are unchanged from previous note.  ALLERGIES:  is allergic to fish allergy, peanut-containing drug products, soy allergy, and buspirone.  MEDICATIONS:  Current Outpatient Medications  Medication Sig Dispense Refill  . ALPRAZolam (XANAX) 0.25 MG tablet Take 1 tablet (0.25 mg total) by mouth daily as needed for anxiety. 30 tablet 0  . Cholecalciferol (VITAMIN D3 PO) Take by mouth daily.    . NORVASC 2.5 MG tablet Take 1 tablet (2.5 mg total) daily  by mouth. 30 tablet 3   Current Facility-Administered Medications  Medication Dose Route Frequency Provider Last Rate Last Admin  . 0.9 %  sodium chloride infusion  500 mL Intravenous Once Nelida Meuse III, MD        PHYSICAL EXAMINATION: ECOG PERFORMANCE STATUS: 0 - Asymptomatic  Vitals:   07/04/20 1325  BP: (!) 147/76  Pulse: 84  Resp: 17  Temp: (!) 97.2 F (36.2 C)  SpO2: 100%   Filed Weights   07/04/20 1325  Weight: 119 lb 14.4 oz (54.4 kg)    GENERAL:alert, no distress and comfortable SKIN: skin color, texture, turgor are normal, no rashes or significant lesions EYES: normal, Conjunctiva are pink and non-injected, sclera clear  NECK: supple, thyroid normal size, non-tender, without nodularity LYMPH:  no palpable lymphadenopathy in the cervical, axillary  LUNGS: clear to auscultation and percussion with normal breathing effort HEART: regular rate & rhythm and no murmurs and no lower extremity edema ABDOMEN:abdomen soft, non-tender and normal bowel sounds. No hepatomegaly.  Musculoskeletal:no cyanosis of digits and no clubbing  NEURO: alert & oriented x 3 with fluent speech, no focal motor/sensory deficits  LABORATORY DATA:  I have reviewed the data as listed CBC Latest Ref Rng & Units 06/15/2020  08/18/2019 04/19/2019  WBC 4.0 - 10.5 K/uL 2.7 Repeated and verified X2.(L) 2.7(L) 2.6(L)  Hemoglobin 12.0 - 15.0 g/dL 10.9(L) 11.6(L) 11.2(L)  Hematocrit 36.0 - 46.0 % 33.6(L) 36.7 35.6(L)  Platelets 150.0 - 400.0 K/uL 198.0 181 154     CMP Latest Ref Rng & Units 06/15/2020 08/18/2019 04/19/2019  Glucose 70 - 99 mg/dL 85 105(H) 94  BUN 6 - 23 mg/dL _0 Creatinine 0.40 - 1.20 mg/dL 0.72 0.89 0.92  Sodium 135 - 145 mEq/L 134(L) 142 142  Potassium 3.5 - 5.1 mEq/L 4.0 4.0 3.6  Chloride 96 - 112 mEq/L 100 106 103  CO2 19 - 32 mEq/L _1 Calcium 8.4 - 10.5 mg/dL 9.0 9.2 9.6  Total Protein 6.0 - 8.3 g/dL 7.0 7.2 7.4  Total Bilirubin 0.2 - 1.2 mg/dL 0.6 0.4 0.4   Alkaline Phos 39 - 117 U/L 63 58 55  AST 0 - 37 U/L _2 ALT 0 - 35 U/L _3 RADIOGRAPHIC STUDIES: I have personally reviewed the radiological images as listed and agreed with the findings in the report. No results found.   ASSESSMENT & PLAN:  ANYJAH ROUNDTREE is a 70 y.o. female with   1. Rectal adenocarcinoma and liver mass  -This was discovered on routine screening colonoscopy. She had a mild constipation, no other symptoms. -I reviewed her CT scan images in person and with patient, and also discussed colonoscopy and biopsy results. -Her rectal mass biopsy showed invasive adenocarcinoma, arising from a tubular adenoma, this is concerning for primary rectal cancer. -Unfortunately her staging CT scan also showed a 1.2 cm mass in the left hepatic lobe, highly concerning for liver metastasis. She also has increased soft tissue fullness at the previous right hemicolectomy surgical site, concerning for local recurrence. It has been 4.5 years from her initial colon cancer diagnosis, local and metastatic recurrence from previous right colon cancer is less likely. -We discussed obtaining a PET scan for further evaluation, since the change of cure is much higher if she has only oligo liver metastasis. We discussed the treatment option is likely chemotherapy, followed by chemoradiation to rectal cancer, followed by surgical resection, if feasible, with goal to cure her cancer. However if she has multiple site metastasis, then we will unlikely offer curative surgery, but it is still treatable with systemic treatment -Patient is not very keen about treatment, she asked many questions about the natural course of metastatic rectal cancer. -After lengthy discussion, patient agreed to obtain PET scan, then will think about liver biopsy, and treatment options. -I will discuss her case with our GI tumor board tomorrow also.   1. Cancer of ascending colon, pT4bN1bM0, stage IIIC, MSI-stable,  (+) surgical margins at pelvic wall -She was diagnosed in 12/2015. She is s/p right hemicolectomy with rightsalpingo oophorectomyand adjuvant ChemoRT.  -I previously strongly recommend her to consider adjuvant chemotherapy, due to her extremely high risk of recurrence. After lengthy discussion, patient declined chemotherapy due to the concern of side effects and impact on her quality of life.  -Her CT CAP from 04/19/19 showed NED.   2. Mild Anemia  -Has been stable and mild for the past year.  -worse lately due to her newly diagnosed rectal cancer     3. HTN, Anxiety -She'll follow-up with her primary care physician and continue medication -She uses Xanax as needed. -stable, not worse since her recent rectal cancer diagnosis  Plan -scan, colonoscopy and biopsy reviewed  -PET scan in 1-2 weeks then f/u after, to determine liver biopsy and treatment plan  -nutrition referral  -will discuss her case with GI tumor board tomorrow   No problem-specific Assessment & Plan notes found for this encounter.   Orders Placed This Encounter  Procedures  . NM PET Image Initial (PI) Skull Base To Thigh    Standing Status:   Future    Standing Expiration Date:   07/04/2021    Order Specific Question:   If indicated for the ordered procedure, I authorize the administration of a radiopharmaceutical per Radiology protocol    Answer:   Yes    Order Specific Question:   Preferred imaging location?    Answer:   [facility]  . Amb Referral to Nutrition and Diabetic E    Referral Priority:   Routine    Referral Type:   Consultation    Referral Reason:   Specialty Services Required    Number of Visits Requested:   1   All questions were answered. The patient knows to call the clinic with any problems, questions or concerns. No barriers to learning was detected. The total time spent in the appointment was 40 minutes.     [name], MD 07/04/2020   I, Joslyn Devon, am acting as scribe for  [name], MD.   I have reviewed the above documentation for accuracy and completeness, and I agree with the above.

## 2020-07-05 ENCOUNTER — Telehealth: Payer: Self-pay | Admitting: Hematology

## 2020-07-05 ENCOUNTER — Encounter: Payer: Self-pay | Admitting: Hematology

## 2020-07-05 ENCOUNTER — Other Ambulatory Visit: Payer: Self-pay

## 2020-07-05 NOTE — Telephone Encounter (Signed)
Scheduled appts per 1/5 los. Left voicemail with appt date and time.  

## 2020-07-05 NOTE — Progress Notes (Signed)
The proposed treatment discussed in conference is for discussion purposes only and is not a binding recommendation.  The patients have not been physically examined, or presented with their treatment options.  Therefore, final treatment plans cannot be decided.   

## 2020-07-05 NOTE — Progress Notes (Signed)
Met with patient prior to her visit with Dr. [name] today.  I explained my role as nurse navigator and she was previously given my direct contact information.

## 2020-07-07 ENCOUNTER — Telehealth: Payer: Self-pay | Admitting: Hematology

## 2020-07-07 NOTE — Telephone Encounter (Signed)
Scheduled appt per 1/5 sch msg - left message with appt date and time   

## 2020-07-11 ENCOUNTER — Telehealth: Payer: Self-pay | Admitting: Hematology

## 2020-07-11 NOTE — Telephone Encounter (Signed)
Called pt per 1/6 sch msg - left message for patient with new appt date and time

## 2020-07-12 ENCOUNTER — Other Ambulatory Visit: Payer: Self-pay

## 2020-07-12 ENCOUNTER — Ambulatory Visit (HOSPITAL_COMMUNITY)
Admission: RE | Admit: 2020-07-12 | Discharge: 2020-07-12 | Disposition: A | Discharge status: Home / Self Care | Payer: Federal, State, Local not specified - PPO | Source: Ambulatory Visit | Attending: Hematology | Admitting: Hematology

## 2020-07-12 DIAGNOSIS — C787 Secondary malignant neoplasm of liver and intrahepatic bile duct: Secondary | ICD-10-CM | POA: Diagnosis present

## 2020-07-12 DIAGNOSIS — C20 Malignant neoplasm of rectum: Secondary | ICD-10-CM | POA: Diagnosis present

## 2020-07-12 LAB — GLUCOSE, CAPILLARY: Glucose-Capillary: 91 mg/dL (ref 70–99)

## 2020-07-12 MED ORDER — FLUDEOXYGLUCOSE F - 18 (FDG) INJECTION
5.7000 | Freq: Once | INTRAVENOUS | Status: AC | PRN
  Administered 2020-07-12: 5.7 via INTRAVENOUS

## 2020-07-12 NOTE — Progress Notes (Signed)
[place]   [phone] [phone]   Clinic Follow up Note   Patient Care Team: Patient, No Pcp Per as PCP - General (General Practice) [name], NP as Nurse Practitioner (Oncology) [name], MD as Consulting Physician (Oncology) [name], RN as Oncology Nurse Navigator  Date of Service:  07/14/2020  CHIEF COMPLAINT: Follow up stage IIIC right colon cancer and newly diagnosed rectal cancer  SUMMARY OF ONCOLOGIC HISTORY: Oncology History Overview Note  Cancer Staging Cancer of ascending colon [facility]) Staging form: Colon and Rectum, AJCC 7th Edition - Clinical stage from 02/07/2016: Stage IIIC (T4b, N1b, M0) - Signed by [name], MD on 03/04/2016    Cancer of ascending colon ([name])  10/25/2015 Imaging   CT ABD/PELVIS:  Inflammatory changes inferior to the cecal tip appear improved, there is still irregular soft tissue thickening of the cecal tip, and there are adjacent prominent lymph nodes in the ileocolonic mesentery, measuring 13 mm on image 49 and 8 mm on image 52. In addition, there is a 2.5 x 1.8 cm nodule on image 46 which has central low density. Therefore, these findings are moderately suspicious for an underlying cecal malignancy with perforation.    01/19/2016 Procedure   COLONOSCOPY per Dr. [name]: Fungating, ulcerated mass almost obstructing mid ascending colon   01/19/2016 Initial Biopsy   Diagnosis Surgical [P], cecal mass - INVASIVE ADENOCARCINOMA WITH ULCERATION. - SEE COMMENT.   02/05/2016 Tumor Marker   Patient's tumor was tested for the following markers: CEA Results of the tumor marker test revealed 5.7.   02/07/2016 Initial Diagnosis   Cancer of ascending colon ([name])   02/07/2016 Definitive Surgery   Laparoscopic assisted right hemicolectomy and right salpingo oopherectomy--Dr. [name]   02/07/2016 Pathologic Stage   p T4 N1b   2/43 nodes +   02/07/2016 Pathology Results   MMR normal; G2  adenocarcinoma;proximal & distal margins negative; soft tissue mass on pelvic sidewall + for adenocarcinoma with positive margin MSI Stable   03/08/2016 Imaging   CT chest negative for metastasis.    03/19/2016 - 04/25/2016 Radiation Therapy   Adjuvant irradiation, 50 gray in 28 fractions   03/19/2016 - 04/22/2016 Chemotherapy   Xeloda 1500 mg twice daily, started on 03/19/2016, dose reduced to 1000 mg twice daily from week 3 due to neutropenia, and patient stopped 3 days before last dose radiation due to difficulty swallowing the pill    05/20/2016 -  Adjuvant Chemotherapy   Patient declined adjuvant chemotherapy   09/16/2016 Imaging   CT CAP w Contrast 1. No evidence of local tumor recurrence at the ileocolic anastomosis. 2. No findings suspicious for metastatic disease in the chest, abdomen or pelvis. 3. Nonspecific trace free fluid in the pelvic cul-de-sac. 4. Stable solitary 3 mm right upper lobe pulmonary nodule, for which 6 month stability has been demonstrated, probably benign. 5. Additional findings include stable right posterior pericardial cyst and small calcified uterine fibroids.   05/13/2017 Imaging   CT CAP W Contrast 05/13/17 IMPRESSION: 1. No current findings of residual or recurrent malignancy. 2. Mild prominence of stool throughout the colon. Nondistended portions of the rectum. 3. Several tiny pulmonary nodules are stable from the earliest available comparison of 03/08/2016 and probably benign, but may merit surveillance. 4. Other imaging findings of potential clinical significance: Old granulomatous disease. Aortoiliac atherosclerotic vascular disease. Lumbar spondylosis and degenerative disc disease. Stable amount of trace free pelvic fluid.   04/27/2018 Imaging   04/27/2018 CT CAP IMPRESSION:  Stable exam. No evidence of recurrent or metastatic carcinoma within the chest, abdomen, or pelvis   04/19/2019 Imaging   CT CAP W Contrast  IMPRESSION: Chest  Impression:   1. No evidence of thoracic metastasis. 2. Stable small bilateral pulmonary nodules.   Abdomen / Pelvis Impression:   1. No evidence local colorectal carcinoma recurrence or metastasis in the abdomen pelvis. 2. Post RIGHT hemicolectomy.   Rectal cancer metastasized to liver ([place])  06/15/2020 Procedure   Screening Colonoscopy by Dr [name]  IMPRESSION - Decreased sphincter tone and internal hemorrhoids that prolapse with straining, but require manual replacement into the anal canal (Grade III) found on digital rectal exam. - Patent side-to-side ileo-colonic anastomosis, characterized by healthy appearing mucosa. - The examined portion of the ileum was normal. - One diminutive polyp in the proximal transverse colon, removed with a cold biopsy forceps. Resected and retrieved. - Likely malignant partially obstructing tumor in the mid rectum. Biopsied. Tattooed. - The examination was otherwise normal on direct and retroflexion views.   06/15/2020 Initial Biopsy   Diagnosis 1. Transverse Colon Polyp - HYPERPLASTIC POLYP 2. Rectum, biopsy - ADENOCARCINOMA ARISING IN A TUBULAR ADENOMA WITH HIGH-GRADE DYSPLASIA. SEE NOTE Diagnosis Note 2. Dr. [name] reviewed the case and concurs with the diagnosis. Dr. [name] was notified on 06/16/2020.   06/28/2020 Imaging   CT CAP  IMPRESSION: 1. New low-density focus in the anterior aspect of the lateral segment LEFT hepatic lobe measuring 1.2 x 1.0 cm, compatible with small metastatic lesion in the LEFT hepatic lobe. 2. Soft tissue in the RIGHT iliac fossa following RIGHT hemicolectomy invades the psoas musculature and is slowly enlarging over time, more linear on the prior study now highly concerning for recurrence/metastasis to this location. 3. Signs of enteritis, potentially post radiation changes of the small bowel. Tethered small bowel in the RIGHT lower quadrant shows focal thickening and narrowing suspicious for small  bowel involvement and developing partial obstruction though currently contrast passes beyond this point into the colon. 4. Rectal thickening in this patient with known rectal mass as described. 5. No evidence of metastatic disease in the chest. 6. Stable small pulmonary nodules. 7.  and aortic atherosclerosis.   Aortic Atherosclerosis (ICD10-I70.0) and Emphysema (ICD10-J43.9).   07/04/2020 Initial Diagnosis   Rectal cancer metastasized to liver ([place])   07/12/2020 PET scan   IMPRESSION: 1. Exam positive for FDG avid rectal tumor which corresponds to the recent colonoscopy findings. 2. FDG avid soft tissue mass within the right iliac fossa is noted and consistent with local tumor recurrence from previous ascending colon tumor. 3. Lateral segment left lobe of liver lesion is FDG avid concerning for liver metastasis. 4. No specific findings identified to suggest metastatic disease to the chest.      CURRENT THERAPY:  Pending   INTERVAL HISTORY: Brittney Spencer is here for a follow up. She presents to the clinic alone.  She is stable denies any significant pain, mild constipation is manageable, no hematochezia, abdominal bloating or other new complaints.  She has mild fatigue, able to function well at home.  All other systems were reviewed with the patient and are negative.  MEDICAL HISTORY:  Past Medical History:  Diagnosis Date  . AAA (abdominal aortic aneurysm) ([facility])    infrarenal 4.1 cmper s-9-19 scan on chart  . Anemia    hx of  . Anxiety    has PRN meds  . Colon cancer ([place]) 2017   RIGHT hemi colectomy-s/p sx  . GERD (  gastroesophageal reflux disease)    OTC meds/diet control  . Hypertension    on meds  . Retinal detachment, right 09/2019  . Vitamin D deficiency     SURGICAL HISTORY: Past Surgical History:  Procedure Laterality Date  . COLONOSCOPY  2018   HD-hams  . COLONSCOPY  12/2015  . LAPAROSCOPIC RIGHT HEMI COLECTOMY Right 02/07/2016   Procedure:  LAPAROSCOPIC ASSISTED RIGHT HEMI COLECTOMY AND RIGHT SALPINGO OOPHERECTOMY;  Surgeon: [name], MD;  Location: [facility];  Service: General;  Laterality: Right;  . RETINAL DETACHMENT SURGERY  09/2019    I have reviewed the social history and family history with the patient and they are unchanged from previous note.  ALLERGIES:  is allergic to fish allergy, peanut-containing drug products, soy allergy, and buspirone.  MEDICATIONS:  Current Outpatient Medications  Medication Sig Dispense Refill  . ALPRAZolam (XANAX) 0.25 MG tablet Take 1 tablet (0.25 mg total) by mouth daily as needed for anxiety. 30 tablet 0  . Cholecalciferol (VITAMIN D3 PO) Take by mouth daily.    Marland Kitchen docusate sodium (COLACE) 100 MG capsule 1 capsule as needed    . NORVASC 2.5 MG tablet Take 1 tablet (2.5 mg total) daily by mouth. 30 tablet 3   Current Facility-Administered Medications  Medication Dose Route Frequency Provider Last Rate Last Admin  . 0.9 %  sodium chloride infusion  500 mL Intravenous Once Nelida Meuse III, MD        PHYSICAL EXAMINATION: ECOG PERFORMANCE STATUS: 1 - Symptomatic but completely ambulatory  Vitals:   07/14/20 1159  BP: 132/72  Pulse: 81  Resp: 14  Temp: 97.7 F (36.5 C)  SpO2: 100%   Filed Weights   07/14/20 1159  Weight: 118 lb 12.8 oz (53.9 kg)    GENERAL:alert, no distress and comfortable SKIN: skin color, texture, turgor are normal, no rashes or significant lesions extremity edema Musculoskeletal:no cyanosis of digits and no clubbing  NEURO: alert & oriented x 3 with fluent speech, no focal motor/sensory deficits  LABORATORY DATA:  I have reviewed the data as listed CBC Latest Ref Rng & Units 06/15/2020 08/18/2019 04/19/2019  WBC 4.0 - 10.5 K/uL 2.7 Repeated and verified X2.(L) 2.7(L) 2.6(L)  Hemoglobin 12.0 - 15.0 g/dL 10.9(L) 11.6(L) 11.2(L)  Hematocrit 36.0 - 46.0 % 33.6(L) 36.7 35.6(L)  Platelets 150.0 - 400.0 K/uL 198.0 181 154     CMP Latest Ref Rng  & Units 06/15/2020 08/18/2019 04/19/2019  Glucose 70 - 99 mg/dL 85 105(H) 94  BUN 6 - 23 mg/dL '6 9 13  ' Creatinine 0.40 - 1.20 mg/dL 0.72 0.89 0.92  Sodium 135 - 145 mEq/L 134(L) 142 142  Potassium 3.5 - 5.1 mEq/L 4.0 4.0 3.6  Chloride 96 - 112 mEq/L 100 106 103  CO2 19 - 32 mEq/L '25 28 27  ' Calcium 8.4 - 10.5 mg/dL 9.0 9.2 9.6  Total Protein 6.0 - 8.3 g/dL 7.0 7.2 7.4  Total Bilirubin 0.2 - 1.2 mg/dL 0.6 0.4 0.4  Alkaline Phos 39 - 117 U/L 63 58 55  AST 0 - 37 U/L '18 16 18  ' ALT 0 - 35 U/L '9 11 9      ' RADIOGRAPHIC STUDIES: I have personally reviewed the radiological images as listed and agreed with the findings in the report. No results found.   ASSESSMENT & PLAN:  KALISTA LAGUARDIA is a 70 y.o. female with    1. Rectal adenocarcinoma, with liver and right pelvic metastasis, recurrence from previous colon cancer vs  new primary   -This was found to have a rectal mas on routine screening colonoscopy. She had a mild constipation, no other symptoms. -Her 06/15/20 rectal mass biopsy showed invasive adenocarcinoma, arising from a tubular adenoma -I personally reviewed and discussed her PET from 07/12/20 which shows positive uptake of known rectal tumor and soft tissue mass within the right iliac fossa indicating recurrent prior colon cancer. There is also left lobe of liver lesion is FDG avid concerning for liver metastasis. I personally reviewed with patient today.  -I discussed biopsy, likely of liver, for more definitive diagnosis.complications reviewed. Since her PET scan are pretty convincing for metastatic disease, pt declined biopsy and I agree with it -I discussed whether rectal ca or recurrent colon cancer, her cancer is probably not resectable and not curable at this stage, based on our tumor board discussion with our local surgeons. We discussed H[facility] at [facility]. She declined outside surgical opinion.  -I also discussed main treatment with systemic chemotherapy to control disease, goal  of care is palliative to prolong her life. I recommend First-line FOLFOX or FOLFIRI q2weeks. I would start from low dose. Will monitor response with scan every 3 months.   --Chemotherapy consent: Side effects including but does not limited to, fatigue, nausea, vomiting, diarrhea, hair loss, neuropathy, fluid retention, renal and kidney dysfunction, neutropenic fever, needed for blood transfusion, bleeding, were discussed with patient in great detail. This treatment does require PAC placement.  -Will obtain Molecular testing on her biopsy sample to see if she is eligible for target therapy or immunotherapy. -I discussed without chemo, her rectal mass will grow and can lead to more symptoms and obstruction. She does have the choice of target palliative Radiation.  -She will discuss her treatment options with her family, and call us next week with her decision  -F/u open   2. Cancer of ascending colon, pT4bN1bM0, stage IIIC, MSI-stable, (+) surgical margins at pelvic wall -She was diagnosed in 12/2015. She is s/p right hemicolectomy with rightsalpingo oophorectomyand adjuvant ChemoRT.  -I previously strongly recommend her to consider adjuvant chemotherapy, due to her extremely high risk of recurrence. After lengthy discussion, patient declined chemotherapy due to the concern of side effects and impact on her quality of life. -Her 04/2019 CT scan was NED  -With recent rectal cancer diagnosis, her PET from 07/12/20 indicates soft tissue mass within the right iliac fossa is noted and consistent with local tumor recurrence from previous ascending colon tumor. I discussed with growth this can become symptomatic.  -She declined option of liver biopsy. She may proceed with treatment for her rectal cancer.   3. Mild Anemia  -Has been stable and mild for the past year.  -worse lately due to her newly diagnosed rectal cancer. Will monitor.   4. HTN, Anxiety -She'll follow-up with her primary care  physician and continue medication -She uses Xanax as needed. -stable, not worse since her recent rectal cancer diagnosis    Plan -I recommend port placement and first line chemo with FOLFIRI  -she declined surgical consult at this point  -F/u open. She will contact GI Navigator next week (1/19) about her decision.     No problem-specific Assessment & Plan notes found for this encounter.   No orders of the defined types were placed in this encounter.  All questions were answered. The patient knows to call the clinic with any problems, questions or concerns. No barriers to learning was detected. The total time spent in the appointment was 28  minutes.     [name], MD 07/14/2020   I, Joslyn Devon, am acting as scribe for [name], MD.   I have reviewed the above documentation for accuracy and completeness, and I agree with the above.

## 2020-07-14 ENCOUNTER — Other Ambulatory Visit: Payer: Self-pay

## 2020-07-14 ENCOUNTER — Inpatient Hospital Stay (HOSPITAL_BASED_OUTPATIENT_CLINIC_OR_DEPARTMENT_OTHER): Payer: Federal, State, Local not specified - PPO | Admitting: Hematology

## 2020-07-14 VITALS — BP 132/72 | HR 81 | Temp 97.7°F | Resp 14 | Ht 63.0 in | Wt 118.8 lb

## 2020-07-14 DIAGNOSIS — I10 Essential (primary) hypertension: Secondary | ICD-10-CM | POA: Diagnosis not present

## 2020-07-14 DIAGNOSIS — C182 Malignant neoplasm of ascending colon: Secondary | ICD-10-CM

## 2020-07-14 DIAGNOSIS — D500 Iron deficiency anemia secondary to blood loss (chronic): Secondary | ICD-10-CM | POA: Diagnosis not present

## 2020-07-15 ENCOUNTER — Encounter: Payer: Self-pay | Admitting: Hematology

## 2020-07-15 NOTE — Progress Notes (Signed)
START ON PATHWAY REGIMEN - Colorectal     A cycle is every 14 days:     Bevacizumab-xxxx      Irinotecan      Leucovorin      Fluorouracil      Fluorouracil   **Always confirm dose/schedule in your pharmacy ordering system**  Patient Characteristics: Distant Metastases, Nonsurgical Candidate, KRAS/NRAS Mutation Positive/Unknown (BRAF V600 Wild-Type/Unknown), Standard Cytotoxic Therapy, First Line Standard Cytotoxic Therapy, Bevacizumab Eligible, PS = 0,1 Tumor Location: Rectal Therapeutic Status: Distant Metastases Microsatellite/Mismatch Repair Status: Unknown BRAF Mutation Status: Awaiting Test Results KRAS/NRAS Mutation Status: Awaiting Test Results Standard Cytotoxic Line of Therapy: First Line Standard Cytotoxic Therapy ECOG Performance Status: 1 Bevacizumab Eligibility: Eligible Intent of Therapy: Non-Curative / Palliative Intent, Discussed with Patient 

## 2020-07-17 ENCOUNTER — Telehealth: Payer: Self-pay | Admitting: Hematology

## 2020-07-17 NOTE — Telephone Encounter (Signed)
No 1/14 los. No changes made to pt's schedule.  

## 2020-07-18 ENCOUNTER — Telehealth: Payer: Self-pay | Admitting: Nutrition

## 2020-07-18 NOTE — Telephone Encounter (Signed)
Called 3 times and finally left a message to verify telephone visit for pre reg

## 2020-07-19 ENCOUNTER — Inpatient Hospital Stay: Payer: Federal, State, Local not specified - PPO | Admitting: Nutrition

## 2020-07-19 ENCOUNTER — Ambulatory Visit: Payer: Federal, State, Local not specified - PPO | Admitting: Hematology

## 2020-07-19 ENCOUNTER — Telehealth: Payer: Self-pay | Admitting: Nutrition

## 2020-07-19 NOTE — Progress Notes (Signed)
See telephone note.

## 2020-07-19 NOTE — Telephone Encounter (Signed)
Telephone nutrition consult completed with patient. 70 year old female diagnosed with rectal cancer.  Plan is to begin FOLFIRI. Patient reports her appetite is decreased. Current weight documented as 118.8 pounds on January 14. Patient has many questions about low fiber diet.  Educated patient on low fiber diet and provided a copy of education.  Many questions answered.  Teach back method used.  Contact information given.  Patient will contact RD with questions.

## 2020-07-19 NOTE — Progress Notes (Signed)
I left a voice message for patient to call me back on my direct line about her decision regarding treatment.

## 2020-07-19 NOTE — Progress Notes (Signed)
Patient returned my call.  She says she does want to pursue treatment and had lots of questions.  I spoke with her at length and answered her questions.  Her biggest concern is her weight loss.  She has met with the dietician but would like to have lab work drawn and another follow up appointment with Dr. Burr Medico prior to chemo education, port placement and treatment being scheduled.  I have made Dr. Burr Medico aware.

## 2020-07-24 ENCOUNTER — Telehealth: Payer: Self-pay

## 2020-07-24 NOTE — Telephone Encounter (Signed)
Patient calls wanting to know why her lab and follow up have not been scheduled.  I left her a voice message informing her appointments for Friday 07/28/2020 lab at 12:45 and she will see Dr. Burr Medico at 1:20.

## 2020-07-26 NOTE — Progress Notes (Signed)
[place]   [phone] [phone]   Clinic Follow up Note   Patient Care Team: Patient, No Pcp Per as PCP - General (General Practice) [name], NP as Nurse Practitioner (Oncology) [name], MD as Consulting Physician (Oncology) [name], RN as Oncology Nurse Navigator  Date of Service:  07/28/2020  CHIEF COMPLAINT: Follow up stage IIIC right colon cancer and newly diagnosed rectal cancer  SUMMARY OF ONCOLOGIC HISTORY: Oncology History Overview Note  Cancer Staging Cancer of ascending colon [facility]) Staging form: Colon and Rectum, AJCC 7th Edition - Clinical stage from 02/07/2016: Stage IIIC (T4b, N1b, M0) - Signed by [name], MD on 03/04/2016    Cancer of ascending colon ([place])  10/25/2015 Imaging   CT ABD/PELVIS:  Inflammatory changes inferior to the cecal tip appear improved, there is still irregular soft tissue thickening of the cecal tip, and there are adjacent prominent lymph nodes in the ileocolonic mesentery, measuring 13 mm on image 49 and 8 mm on image 52. In addition, there is a 2.5 x 1.8 cm nodule on image 46 which has central low density. Therefore, these findings are moderately suspicious for an underlying cecal malignancy with perforation.    01/19/2016 Procedure   COLONOSCOPY per Dr. [name]: Fungating, ulcerated mass almost obstructing mid ascending colon   01/19/2016 Initial Biopsy   Diagnosis Surgical [P], cecal mass - INVASIVE ADENOCARCINOMA WITH ULCERATION. - SEE COMMENT.   02/05/2016 Tumor Marker   Patient's tumor was tested for the following markers: CEA Results of the tumor marker test revealed 5.7.   02/07/2016 Initial Diagnosis   Cancer of ascending colon ([place])   02/07/2016 Definitive Surgery   Laparoscopic assisted right hemicolectomy and right salpingo oopherectomy--Dr. [name]   02/07/2016 Pathologic Stage   p T4 N1b   2/43 nodes +   02/07/2016 Pathology Results   MMR normal; G2  adenocarcinoma;proximal & distal margins negative; soft tissue mass on pelvic sidewall + for adenocarcinoma with positive margin MSI Stable   03/08/2016 Imaging   CT chest negative for metastasis.    03/19/2016 - 04/25/2016 Radiation Therapy   Adjuvant irradiation, 50 gray in 28 fractions   03/19/2016 - 04/22/2016 Chemotherapy   Xeloda 1500 mg twice daily, started on 03/19/2016, dose reduced to 1000 mg twice daily from week 3 due to neutropenia, and patient stopped 3 days before last dose radiation due to difficulty swallowing the pill    05/20/2016 -  Adjuvant Chemotherapy   Patient declined adjuvant chemotherapy   09/16/2016 Imaging   CT CAP w Contrast 1. No evidence of local tumor recurrence at the ileocolic anastomosis. 2. No findings suspicious for metastatic disease in the chest, abdomen or pelvis. 3. Nonspecific trace free fluid in the pelvic cul-de-sac. 4. Stable solitary 3 mm right upper lobe pulmonary nodule, for which 6 month stability has been demonstrated, probably benign. 5. Additional findings include stable right posterior pericardial cyst and small calcified uterine fibroids.   05/13/2017 Imaging   CT CAP W Contrast 05/13/17 IMPRESSION: 1. No current findings of residual or recurrent malignancy. 2. Mild prominence of stool throughout the colon. Nondistended portions of the rectum. 3. Several tiny pulmonary nodules are stable from the earliest available comparison of 03/08/2016 and probably benign, but may merit surveillance. 4. Other imaging findings of potential clinical significance: Old granulomatous disease. Aortoiliac atherosclerotic vascular disease. Lumbar spondylosis and degenerative disc disease. Stable amount of trace free pelvic fluid.   04/27/2018 Imaging   04/27/2018 CT CAP IMPRESSION:  Stable exam. No evidence of recurrent or metastatic carcinoma within the chest, abdomen, or pelvis   04/19/2019 Imaging   CT CAP W Contrast  IMPRESSION: Chest  Impression:   1. No evidence of thoracic metastasis. 2. Stable small bilateral pulmonary nodules.   Abdomen / Pelvis Impression:   1. No evidence local colorectal carcinoma recurrence or metastasis in the abdomen pelvis. 2. Post RIGHT hemicolectomy.   Rectal cancer metastasized to liver ([place])  06/15/2020 Procedure   Screening Colonoscopy by Dr [name]  IMPRESSION - Decreased sphincter tone and internal hemorrhoids that prolapse with straining, but require manual replacement into the anal canal (Grade III) found on digital rectal exam. - Patent side-to-side ileo-colonic anastomosis, characterized by healthy appearing mucosa. - The examined portion of the ileum was normal. - One diminutive polyp in the proximal transverse colon, removed with a cold biopsy forceps. Resected and retrieved. - Likely malignant partially obstructing tumor in the mid rectum. Biopsied. Tattooed. - The examination was otherwise normal on direct and retroflexion views.   06/15/2020 Initial Biopsy   Diagnosis 1. Transverse Colon Polyp - HYPERPLASTIC POLYP 2. Rectum, biopsy - ADENOCARCINOMA ARISING IN A TUBULAR ADENOMA WITH HIGH-GRADE DYSPLASIA. SEE NOTE Diagnosis Note 2. Dr. [name] reviewed the case and concurs with the diagnosis. Dr. [name] was notified on 06/16/2020.   06/28/2020 Imaging   CT CAP  IMPRESSION: 1. New low-density focus in the anterior aspect of the lateral segment LEFT hepatic lobe measuring 1.2 x 1.0 cm, compatible with small metastatic lesion in the LEFT hepatic lobe. 2. Soft tissue in the RIGHT iliac fossa following RIGHT hemicolectomy invades the psoas musculature and is slowly enlarging over time, more linear on the prior study now highly concerning for recurrence/metastasis to this location. 3. Signs of enteritis, potentially post radiation changes of the small bowel. Tethered small bowel in the RIGHT lower quadrant shows focal thickening and narrowing suspicious for small  bowel involvement and developing partial obstruction though currently contrast passes beyond this point into the colon. 4. Rectal thickening in this patient with known rectal mass as described. 5. No evidence of metastatic disease in the chest. 6. Stable small pulmonary nodules. 7.  and aortic atherosclerosis.   Aortic Atherosclerosis (ICD10-I70.0) and Emphysema (ICD10-J43.9).   07/04/2020 Initial Diagnosis   Rectal cancer metastasized to liver ([place])   07/12/2020 PET scan   IMPRESSION: 1. Exam positive for FDG avid rectal tumor which corresponds to the recent colonoscopy findings. 2. FDG avid soft tissue mass within the right iliac fossa is noted and consistent with local tumor recurrence from previous ascending colon tumor. 3. Lateral segment left lobe of liver lesion is FDG avid concerning for liver metastasis. 4. No specific findings identified to suggest metastatic disease to the chest.   07/24/2020 -  Chemotherapy    Patient is on Treatment Plan: COLORECTAL FOLFIRI / BEVACIZUMAB Q14D         CURRENT THERAPY:  Pending FOLFIRI q2weeks starting next week.   INTERVAL HISTORY: Brittney Spencer is here for a follow up. She presents to the clinic alone. She notes her established PCP is not longer there. Her new PCP appointment has been postponed. She needs refill of her amlodipine. She plans to see eye doctor and Dentist in February. She recently had cataract surgery and she may need filling in 2022. We addressed all her questions. She notes she is not very symptomatic. She is open to palliative care at home. She notes dietician wanted her to reduce fiber diet given rectal  mss. She notes since her cataract surgery, her vision is improving.     REVIEW OF SYSTEMS:   Constitutional: Denies fevers, chills or abnormal weight loss Eyes: Denies blurriness of vision Ears, nose, mouth, throat, and face: Denies mucositis or sore throat Respiratory: Denies cough, dyspnea or  wheezes Cardiovascular: Denies palpitation, chest discomfort or lower extremity swelling Gastrointestinal:  Denies nausea, heartburn or change in bowel habits Skin: Denies abnormal skin rashes Lymphatics: Denies new lymphadenopathy or easy bruising Neurological:Denies numbness, tingling or new weaknesses Behavioral/Psych: Mood is stable, no new changes  All other systems were reviewed with the patient and are negative.  MEDICAL HISTORY:  Past Medical History:  Diagnosis Date  . AAA (abdominal aortic aneurysm) ([facility])    infrarenal 4.1 cmper s-9-19 scan on chart  . Anemia    hx of  . Anxiety    has PRN meds  . Colon cancer ([place]) 2017   RIGHT hemi colectomy-s/p sx  . GERD (gastroesophageal reflux disease)    OTC meds/diet control  . Hypertension    on meds  . Retinal detachment, right 09/2019  . Vitamin D deficiency     SURGICAL HISTORY: Past Surgical History:  Procedure Laterality Date  . COLONOSCOPY  2018   HD-hams  . COLONSCOPY  12/2015  . LAPAROSCOPIC RIGHT HEMI COLECTOMY Right 02/07/2016   Procedure: LAPAROSCOPIC ASSISTED RIGHT HEMI COLECTOMY AND RIGHT SALPINGO OOPHERECTOMY;  Surgeon: [name], MD;  Location: [facility];  Service: General;  Laterality: Right;  . RETINAL DETACHMENT SURGERY  09/2019    I have reviewed the social history and family history with the patient and they are unchanged from previous note.  ALLERGIES:  is allergic to fish allergy, peanut-containing drug products, soy allergy, and buspirone.  MEDICATIONS:  Current Outpatient Medications  Medication Sig Dispense Refill  . potassium chloride (KLOR-CON) 10 MEQ tablet Take 1 tablet (10 mEq total) by mouth daily. 10 tablet 0  . ALPRAZolam (XANAX) 0.25 MG tablet Take 1 tablet (0.25 mg total) by mouth daily as needed for anxiety. 30 tablet 0  . Cholecalciferol (VITAMIN D3 PO) Take by mouth daily.    Marland Kitchen docusate sodium (COLACE) 100 MG capsule 1 capsule as needed    . NORVASC 2.5 MG tablet Take 1  tablet (2.5 mg total) by mouth daily. 30 tablet 3   Current Facility-Administered Medications  Medication Dose Route Frequency Provider Last Rate Last Admin  . 0.9 %  sodium chloride infusion  500 mL Intravenous Once Nelida Meuse III, MD        PHYSICAL EXAMINATION: ECOG PERFORMANCE STATUS: 1 - Symptomatic but completely ambulatory  Vitals:   07/28/20 1259  BP: (!) 153/72  Pulse: 83  Resp: 18  Temp: (!) 97 F (36.1 C)  SpO2: 100%   Filed Weights   07/28/20 1259  Weight: 117 lb 8 oz (53.3 kg)    Due to COVID19 we will limit examination to appearance. Patient had no complaints.  GENERAL:alert, no distress and comfortable SKIN: skin color normal, no rashes or significant lesions EYES: normal, Conjunctiva are pink and non-injected, sclera clear  NEURO: alert & oriented x 3 with fluent speech   LABORATORY DATA:  I have reviewed the data as listed CBC Latest Ref Rng & Units 07/28/2020 06/15/2020 08/18/2019  WBC 4.0 - 10.5 K/uL 2.7(L) 2.7 Repeated and verified X2.(L) 2.7(L)  Hemoglobin 12.0 - 15.0 g/dL 10.8(L) 10.9(L) 11.6(L)  Hematocrit 36.0 - 46.0 % 35.1(L) 33.6(L) 36.7  Platelets 150 - 400 K/uL 202  198.0 181     CMP Latest Ref Rng & Units 07/28/2020 06/15/2020 08/18/2019  Glucose 70 - 99 mg/dL 84 85 105(H)  BUN 8 - 23 mg/dL '17 6 9  ' Creatinine 0.44 - 1.00 mg/dL 0.89 0.72 0.89  Sodium 135 - 145 mmol/L 139 134(L) 142  Potassium 3.5 - 5.1 mmol/L 3.3(L) 4.0 4.0  Chloride 98 - 111 mmol/L 104 100 106  CO2 22 - 32 mmol/L '27 25 28  ' Calcium 8.9 - 10.3 mg/dL 9.1 9.0 9.2  Total Protein 6.5 - 8.1 g/dL 7.5 7.0 7.2  Total Bilirubin 0.3 - 1.2 mg/dL 0.5 0.6 0.4  Alkaline Phos 38 - 126 U/L 71 63 58  AST 15 - 41 U/L '17 18 16  ' ALT 0 - 44 U/L '8 9 11      ' RADIOGRAPHIC STUDIES: I have personally reviewed the radiological images as listed and agreed with the findings in the report. No results found.   ASSESSMENT & PLAN:  JAZZMAN LOUGHMILLER is a 70 y.o. female with    1. Rectal  adenocarcinoma, with liver and right pelvic metastasis, recurrence from previous colon cancer vs new primary   -she was found to have a rectal mas on routine screening colonoscopy in 05/2020. She had a mild constipation, no other symptoms. -Her 06/15/20 rectal mass biopsy showed invasive adenocarcinoma, arising from a tubular adenoma -Her 07/12/20 PET showed positive uptake of known rectal tumor and soft tissue mass within the right iliac fossa indicating recurrent prior colon cancer. There is also left lobe of liver lesion is FDG avid concerning for liver metastasis. I again personally reviewed scan with patient today.  -I discussed biopsy, likely of liver, for more definitive diagnosis.complications reviewed. Since her PET scan are pretty convincing for metastatic disease, pt declined biopsy and I agree with it -I discussed whether rectal cancer or recurrent colon cancer, her cancer is probably not resectable and not curable at this stage, based on our tumor board discussion with our local surgeons. We discussed H[facility] at [facility]. She declined outside surgical opinion.  -I again discussed this cancer can be from prior colon cancer or a new primary cancer.  -I also discussed main treatment with systemic chemotherapy to control disease, goal of care is palliative to prolong her life. I recommend First-line FOLFOX or FOLFIRI q2weeks. We previously discussed at length side effects, risk and benefit of chemotherapy. She agreed to proceed with FOLFIRI.  -her FO is still pending, will see if she is eligible for EGFR inhibitor -I discussed if she is interested in more help at home, I can refer her to palliative care.  -I answered all her questions to her satisfaction and understanding.  -f/u with start of treatment.   2. Cancer of ascending colon, pT4bN1bM0, stage IIIC, MSI-stable, (+) surgical margins at pelvic wall -She was diagnosed in 12/2015. She is s/p right hemicolectomy with rightsalpingo  oophorectomyand adjuvant ChemoRT.  -I previously strongly recommend her to consider adjuvant chemotherapy, due to her extremely high risk of recurrence. After lengthy discussion, patient declined chemotherapy due to the concern of side effects and impact on her quality of life. -Her 04/2019 CT scan was NED  -With recent rectal cancer diagnosis, her PET from 07/12/20 indicates soft tissue mass within the right iliac fossa is noted and consistent with local tumor recurrence from previous ascending colon tumor. I discussed with growth this can become symptomatic.  -She declined option of liver biopsy. She may proceed with treatment for her rectal cancer.  3. Mild Anemia  -Has been stable and mild for the past year. -worse lately due to her newly diagnosed rectal cancer. Will monitor.   4. HTN, Anxiety -She'll follow-up with her primary care physician and continue medication -She uses Xanax as needed. -stable, not worse since her recent rectal cancer diagnosis  5. Hypokalemia  -K at 3.3 today (07/28/20). I called in oral potassium once daily. She can also increase potassium rich foods in diet.   Plan  -I called in amlodipine and oral potassium today  -PAC placement by IR next week  -Chemo education class in next week  -Lab, flush, f/u and FOLFIRI and bevacizumab on  2/7 and 2 weeks afterward.  -FO genomic test    No problem-specific Assessment & Plan notes found for this encounter.   Orders Placed This Encounter  Procedures  . IR IMAGING GUIDED PORT INSERTION    Standing Status:   Future    Standing Expiration Date:   07/28/2021    Order Specific Question:   Reason for Exam (SYMPTOM  OR DIAGNOSIS REQUIRED)    Answer:   chemo    Order Specific Question:   Preferred Imaging Location?    Answer:   [facility]  . [facility] (IN HOUSE-[facility])   All questions were answered. The patient knows to call the clinic with any problems, questions or concerns. No barriers to learning was  detected. The total time spent in the appointment was 30 minutes.     [name], MD 07/28/2020   I, Joslyn Devon, am acting as scribe for [name], MD.   I have reviewed the above documentation for accuracy and completeness, and I agree with the above.

## 2020-07-28 ENCOUNTER — Inpatient Hospital Stay: Payer: Federal, State, Local not specified - PPO

## 2020-07-28 ENCOUNTER — Other Ambulatory Visit: Payer: Self-pay

## 2020-07-28 ENCOUNTER — Inpatient Hospital Stay (HOSPITAL_BASED_OUTPATIENT_CLINIC_OR_DEPARTMENT_OTHER): Payer: Federal, State, Local not specified - PPO | Admitting: Hematology

## 2020-07-28 VITALS — BP 153/72 | HR 83 | Temp 97.0°F | Resp 18 | Ht 63.0 in | Wt 117.5 lb

## 2020-07-28 DIAGNOSIS — I10 Essential (primary) hypertension: Secondary | ICD-10-CM

## 2020-07-28 DIAGNOSIS — C182 Malignant neoplasm of ascending colon: Secondary | ICD-10-CM

## 2020-07-28 DIAGNOSIS — D500 Iron deficiency anemia secondary to blood loss (chronic): Secondary | ICD-10-CM

## 2020-07-28 LAB — CBC WITH DIFFERENTIAL (CANCER CENTER ONLY)
Abs Immature Granulocytes: 0 10*3/uL (ref 0.00–0.07)
Basophils Absolute: 0 10*3/uL (ref 0.0–0.1)
Basophils Relative: 1 %
Eosinophils Absolute: 0.2 10*3/uL (ref 0.0–0.5)
Eosinophils Relative: 6 %
HCT: 35.1 % — ABNORMAL LOW (ref 36.0–46.0)
Hemoglobin: 10.8 g/dL — ABNORMAL LOW (ref 12.0–15.0)
Immature Granulocytes: 0 %
Lymphocytes Relative: 36 %
Lymphs Abs: 1 10*3/uL (ref 0.7–4.0)
MCH: 27 pg (ref 26.0–34.0)
MCHC: 30.8 g/dL (ref 30.0–36.0)
MCV: 87.8 fL (ref 80.0–100.0)
Monocytes Absolute: 0.3 10*3/uL (ref 0.1–1.0)
Monocytes Relative: 9 %
Neutro Abs: 1.3 10*3/uL — ABNORMAL LOW (ref 1.7–7.7)
Neutrophils Relative %: 48 %
Platelet Count: 202 10*3/uL (ref 150–400)
RBC: 4 MIL/uL (ref 3.87–5.11)
RDW: 14.4 % (ref 11.5–15.5)
WBC Count: 2.7 10*3/uL — ABNORMAL LOW (ref 4.0–10.5)
nRBC: 0 % (ref 0.0–0.2)

## 2020-07-28 LAB — CEA (IN HOUSE-CHCC): CEA (CHCC-In House): 12.62 ng/mL — ABNORMAL HIGH (ref 0.00–5.00)

## 2020-07-28 LAB — COMPREHENSIVE METABOLIC PANEL
ALT: 8 U/L (ref 0–44)
AST: 17 U/L (ref 15–41)
Albumin: 3.9 g/dL (ref 3.5–5.0)
Alkaline Phosphatase: 71 U/L (ref 38–126)
Anion gap: 8 (ref 5–15)
BUN: 17 mg/dL (ref 8–23)
CO2: 27 mmol/L (ref 22–32)
Calcium: 9.1 mg/dL (ref 8.9–10.3)
Chloride: 104 mmol/L (ref 98–111)
Creatinine, Ser: 0.89 mg/dL (ref 0.44–1.00)
GFR, Estimated: >60 mL/min (ref >60)
Glucose, Bld: 84 mg/dL (ref 70–99)
Potassium: 3.3 mmol/L — ABNORMAL LOW (ref 3.5–5.1)
Sodium: 139 mmol/L (ref 135–145)
Total Bilirubin: 0.5 mg/dL (ref 0.3–1.2)
Total Protein: 7.5 g/dL (ref 6.5–8.1)

## 2020-07-28 LAB — IRON AND TIBC
Iron: 57 ug/dL (ref 41–142)
Saturation Ratios: 15 % — ABNORMAL LOW (ref 21–57)
TIBC: 369 ug/dL (ref 236–444)
UIBC: 312 ug/dL (ref 120–384)

## 2020-07-28 LAB — RETICULOCYTES
Immature Retic Fract: 7.8 % (ref 2.3–15.9)
RBC.: 4 MIL/uL (ref 3.87–5.11)
Retic Count, Absolute: 44.4 10*3/uL (ref 19.0–186.0)
Retic Ct Pct: 1.1 % (ref 0.4–3.1)

## 2020-07-28 LAB — FERRITIN: Ferritin: 27 ng/mL (ref 11–307)

## 2020-07-28 MED ORDER — POTASSIUM CHLORIDE ER 10 MEQ PO TBCR: 10.0000 meq | EXTENDED_RELEASE_TABLET | Freq: Every day | ORAL | 0 refills | Status: DC

## 2020-07-28 MED ORDER — NORVASC 2.5 MG PO TABS: 2.5000 mg | ORAL_TABLET | Freq: Every day | ORAL | 3 refills | Status: DC

## 2020-07-29 ENCOUNTER — Encounter: Payer: Self-pay | Admitting: Hematology

## 2020-08-01 ENCOUNTER — Ambulatory Visit: Payer: Federal, State, Local not specified - PPO | Admitting: Gastroenterology

## 2020-08-03 ENCOUNTER — Other Ambulatory Visit: Payer: Self-pay | Admitting: Student

## 2020-08-03 ENCOUNTER — Telehealth: Payer: Self-pay | Admitting: Hematology

## 2020-08-03 NOTE — Telephone Encounter (Signed)
Scheduled per 01/28 los, patient has been called and notified of upcoming appointments.

## 2020-08-03 NOTE — Progress Notes (Signed)
Pharmacist Chemotherapy Monitoring - Initial Assessment    Anticipated start date: 08/10/2020   Regimen:  . Are orders appropriate based on the patient's diagnosis, regimen, and cycle? Yes . Does the plan date match the patient's scheduled date? Yes . Is the sequencing of drugs appropriate? Yes . Are the premedications appropriate for the patient's regimen? Yes . Prior Authorization for treatment is: Approved o If applicable, is the correct biosimilar selected based on the patient's insurance? yes  Organ Function and Labs: Marland Kitchen Are dose adjustments needed based on the patient's renal function, hepatic function, or hematologic function? Yes . Are appropriate labs ordered prior to the start of patient's treatment? Yes . Other organ system assessment, if indicated: bevacizumab: baseline BP . The following baseline labs, if indicated, have been ordered: bevacizumab: urine protein  Dose Assessment: . Are the drug doses appropriate? Yes . Are the following correct: o Drug concentrations Yes o IV fluid compatible with drug Yes o Administration routes Yes o Timing of therapy Yes . If applicable, does the patient have documented access for treatment and/or plans for port-a-cath placement? yes . If applicable, have lifetime cumulative doses been properly documented and assessed? no Lifetime Dose Tracking  No doses have been documented on this patient for the following tracked chemicals: Doxorubicin, Epirubicin, Idarubicin, Daunorubicin, Mitoxantrone, Bleomycin, Oxaliplatin, Carboplatin, Liposomal Doxorubicin  o   Toxicity Monitoring/Prevention: . The patient has the following take home antiemetics prescribed: N/A . The patient has the following take home medications prescribed: N/A . Medication allergies and previous infusion related reactions, if applicable, have been reviewed and addressed. No . The patient's current medication list has been assessed for drug-drug interactions with their  chemotherapy regimen. no significant drug-drug interactions were identified on review.  Order Review: . Are the treatment plan orders signed? No . Is the patient scheduled to see a provider prior to their treatment? Yes  I verify that I have reviewed each item in the above checklist and answered each question accordingly.  @RPHNAME @

## 2020-08-04 ENCOUNTER — Encounter (HOSPITAL_COMMUNITY): Payer: Self-pay

## 2020-08-04 ENCOUNTER — Ambulatory Visit (HOSPITAL_COMMUNITY)
Admission: RE | Admit: 2020-08-04 | Discharge: 2020-08-04 | Disposition: A | Discharge status: Home / Self Care | Payer: Federal, State, Local not specified - PPO | Source: Ambulatory Visit | Attending: Hematology | Admitting: Hematology

## 2020-08-04 ENCOUNTER — Other Ambulatory Visit: Payer: Self-pay

## 2020-08-04 DIAGNOSIS — C182 Malignant neoplasm of ascending colon: Secondary | ICD-10-CM

## 2020-08-04 DIAGNOSIS — Z888 Allergy status to other drugs, medicaments and biological substances status: Secondary | ICD-10-CM | POA: Insufficient documentation

## 2020-08-04 DIAGNOSIS — Z91013 Allergy to seafood: Secondary | ICD-10-CM | POA: Diagnosis not present

## 2020-08-04 DIAGNOSIS — Z79899 Other long term (current) drug therapy: Secondary | ICD-10-CM | POA: Diagnosis not present

## 2020-08-04 DIAGNOSIS — C20 Malignant neoplasm of rectum: Secondary | ICD-10-CM | POA: Insufficient documentation

## 2020-08-04 DIAGNOSIS — Z91010 Allergy to peanuts: Secondary | ICD-10-CM | POA: Diagnosis not present

## 2020-08-04 HISTORY — PX: IR IMAGING GUIDED PORT INSERTION: IMG5740

## 2020-08-04 MED ORDER — LIDOCAINE-EPINEPHRINE 1 %-1:100000 IJ SOLN
INTRAMUSCULAR | Status: AC
  Filled 2020-08-04: qty 1

## 2020-08-04 MED ORDER — HEPARIN SOD (PORK) LOCK FLUSH 100 UNIT/ML IV SOLN
INTRAVENOUS | Status: AC | PRN
  Administered 2020-08-04: 500 [IU] via INTRAVENOUS

## 2020-08-04 MED ORDER — MIDAZOLAM HCL 2 MG/2ML IJ SOLN
INTRAMUSCULAR | Status: AC | PRN
  Administered 2020-08-04: 0.5 mg via INTRAVENOUS
  Administered 2020-08-04: 1 mg via INTRAVENOUS
  Administered 2020-08-04: 0.5 mg via INTRAVENOUS

## 2020-08-04 MED ORDER — MIDAZOLAM HCL 2 MG/2ML IJ SOLN
INTRAMUSCULAR | Status: AC
  Filled 2020-08-04: qty 4

## 2020-08-04 MED ORDER — SODIUM CHLORIDE 0.9 % IV SOLN: INTRAVENOUS | Status: DC

## 2020-08-04 MED ORDER — FENTANYL CITRATE (PF) 100 MCG/2ML IJ SOLN
INTRAMUSCULAR | Status: AC | PRN
  Administered 2020-08-04: 25 ug via INTRAVENOUS
  Administered 2020-08-04: 50 ug via INTRAVENOUS
  Administered 2020-08-04: 25 ug via INTRAVENOUS

## 2020-08-04 MED ORDER — CEFAZOLIN SODIUM-DEXTROSE 2-4 GM/100ML-% IV SOLN: 2.0000 g | Freq: Once | INTRAVENOUS | Status: DC

## 2020-08-04 MED ORDER — HEPARIN SOD (PORK) LOCK FLUSH 100 UNIT/ML IV SOLN
INTRAVENOUS | Status: AC
  Filled 2020-08-04: qty 5

## 2020-08-04 MED ORDER — FENTANYL CITRATE (PF) 100 MCG/2ML IJ SOLN
INTRAMUSCULAR | Status: AC
  Filled 2020-08-04: qty 2

## 2020-08-04 MED ORDER — LIDOCAINE-EPINEPHRINE 1 %-1:100000 IJ SOLN
INTRAMUSCULAR | Status: AC | PRN
  Administered 2020-08-04: 10 mL

## 2020-08-04 NOTE — Procedures (Signed)
Interventional Radiology Procedure Note ° °Procedure: Single Lumen Power Port Placement   ° °Access:  Right internal jugular vein ° °Findings: Catheter tip positioned at cavoatrial junction. Port is ready for immediate use.  ° °Complications: None ° °EBL: < 10 mL ° °Recommendations:  °- Ok to shower in 24 hours °- Do not submerge for 7 days °- Routine line care  ° ° °Deondrea Markos, MD ° ° ° °

## 2020-08-04 NOTE — Consult Note (Signed)
Chief Complaint: Patient was seen in consultation today for Port-A-Cath placement  Referring Physician(s): Feng,Yan  Supervising Physician: Ruthann Cancer  Patient Status: [facility] - Out-pt  History of Present Illness: Brittney Spencer is a 70 y.o. female with past medical history of right colon cancer in 2017, status post surgery and chemoradiation.  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  Additional medical history as below.  Past Medical History:  Diagnosis Date  . AAA (abdominal aortic aneurysm) (HCC)    infrarenal 4.1 cmper s-9-19 scan on chart  . Anemia    hx of  . Anxiety    has PRN meds  . Colon cancer ([place]) 2017   RIGHT hemi colectomy-s/p sx  . GERD (gastroesophageal reflux disease)    OTC meds/diet control  . Hypertension    on meds  . Retinal detachment, right 09/2019  . Vitamin D deficiency     Past Surgical History:  Procedure Laterality Date  . COLONOSCOPY  2018   HD-hams  . COLONSCOPY  12/2015  . LAPAROSCOPIC RIGHT HEMI COLECTOMY Right 02/07/2016   Procedure: LAPAROSCOPIC ASSISTED RIGHT HEMI COLECTOMY AND RIGHT SALPINGO OOPHERECTOMY;  Surgeon: [name], MD;  Location: [facility] ORS;  Service: General;  Laterality: Right;  . RETINAL DETACHMENT SURGERY  09/2019    Allergies: Fish allergy, Peanut-containing drug products, Soy allergy, and Buspirone  Medications: Prior to Admission medications   Medication Sig Start Date End Date Taking? Authorizing Provider  ALPRAZolam (XANAX) 0.25 MG tablet Take 1 tablet (0.25 mg total) by mouth daily as needed for anxiety. 10/06/17   [name], MD  Cholecalciferol (VITAMIN D3 PO) Take by mouth daily.    [provider]  docusate sodium (COLACE) 100 MG capsule 1 capsule as needed    [provider]  NORVASC 2.5 MG tablet Take 1 tablet (2.5 mg total) by mouth daily. 07/28/20   [name], MD  potassium  chloride (KLOR-CON) 10 MEQ tablet Take 1 tablet (10 mEq total) by mouth daily. 07/28/20   [name], MD     Family History  Problem Relation Age of Onset  . Cancer Mother        lung cancer  . Cancer Maternal Grandfather        unknown cancer   . Colon cancer Neg Hx   . Esophageal cancer Neg Hx   . Rectal cancer Neg Hx   . Stomach cancer Neg Hx   . Colon polyps Neg Hx     Social History   Socioeconomic History  . Marital status: Widowed    Spouse name: Not on file  . Number of children: Not on file  . Years of education: Not on file  . Highest education level: Not on file  Occupational History  . Not on file  Tobacco Use  . Smoking status: Never Smoker  . Smokeless tobacco: Never Used  Vaping Use  . Vaping Use: Never used  Substance and Sexual Activity  . Alcohol use: No    Alcohol/week: 0.0 standard drinks  . Drug use: No  . Sexual activity: Yes    Birth control/protection: None  Other Topics Concern  . Not on file  Social History Narrative  . Not on file   Social Determinants of Health   Financial Resource Strain: Not on file  Food Insecurity: Not on file  Transportation Needs: Not on file  Physical Activity: Not on file  Stress: Not  on file  Social Connections: Not on file      Review of Systems currently denies fever, headache, chest pain, dyspnea, cough, abdominal pain, back pain, nausea, vomiting or bleeding  Vital Signs: BP 122/69 (BP Location: Right Arm)   Pulse 89   Temp 98.3 F (36.8 C) (Oral)   Resp 20   SpO2 100%   Physical Exam awake, alert.  Chest clear to auscultation bilaterally.  Heart with regular rate and rhythm.  Abdomen soft, positive bowel sounds, nontender.  Trace pretibial edema bilat  Imaging: NM PET Image Initial (PI) Skull Base To Thigh  Result Date: 07/12/2020 CLINICAL DATA:  Subsequent treatment strategy for colorectal carcinoma. EXAM: NUCLEAR MEDICINE PET SKULL BASE TO THIGH TECHNIQUE: 5.7 mCi F-18 FDG was injected  intravenously. Full-ring PET imaging was performed from the skull base to thigh after the radiotracer. CT data was obtained and used for attenuation correction and anatomic localization. Fasting blood glucose: 91 mg/dl COMPARISON:  06/28/2020 FINDINGS: Mediastinal blood pool activity: SUV max 2.5 Liver activity: SUV max NA NECK: No hypermetabolic lymph nodes in the neck. Incidental CT findings: none CHEST: Unchanged left upper lobe lung nodule measuring 3 mm, image 30/8. This is too small to characterize but is stable compared with 04/19/2019 compatible with a benign abnormality. No FDG avid mediastinal or hilar lymph nodes. Incidental CT findings: Calcified left hilar lymph node is identified compatible with prior granulomatous disease. ABDOMEN/PELVIS: There is a low-attenuation lesion within lateral segment of left lobe of liver measuring 1.6 cm within SUV max of 10.46, image 93/4. No additional liver lesions identified. Within the right lower quadrant of the abdomen there is an FDG avid mass within the right iliac fossa adjacent to surgical clips. This measures approximately 2.6 cm and has an SUV max of 14.24, image 139/4. The tumor appears to invade the right iliacus musculature. FDG avid, mass-like, focal concentric wall thickening of the rectum with an SUV max of 17.8. No FDG avid abdominopelvic lymph nodes. Incidental CT findings: Aortic atherosclerosis. SKELETON: No focal hypermetabolic activity to suggest skeletal metastasis. Incidental CT findings: none IMPRESSION: 1. Exam positive for FDG avid rectal tumor which corresponds to the recent colonoscopy findings. 2. FDG avid soft tissue mass within the right iliac fossa is noted and consistent with local tumor recurrence from previous ascending colon tumor. 3. Lateral segment left lobe of liver lesion is FDG avid concerning for liver metastasis. 4. No specific findings identified to suggest metastatic disease to the chest. Electronically Signed   By: Kerby Moors M.D.   On: 07/12/2020 09:08    Labs:  CBC: Recent Labs    08/18/19 0905 06/15/20 1009 07/28/20 1220  WBC 2.7* 2.7 Repeated and verified X2.* 2.7*  HGB 11.6* 10.9* 10.8*  HCT 36.7 33.6* 35.1*  PLT 181 198.0 202    COAGS: No results for input(s): INR, APTT in the last 8760 hours.  BMP: Recent Labs    08/18/19 0905 06/15/20 1009 07/28/20 1220  NA 142 134* 139  K 4.0 4.0 3.3*  CL 106 100 104  CO2 28 25 27   GLUCOSE 105* 85 84  BUN 9 6 17   CALCIUM 9.2 9.0 9.1  CREATININE 0.89 0.72 0.89  GFRNONAA >60  --  >60  GFRAA >60  --   --     LIVER FUNCTION TESTS: Recent Labs    08/18/19 0905 06/15/20 1009 07/28/20 1220  BILITOT 0.4 0.6 0.5  AST 16 18 17   ALT 11 9 8   ALKPHOS  58 63 71  PROT 7.2 7.0 7.5  ALBUMIN 3.9 3.9 3.9    TUMOR MARKERS: Recent Labs    06/15/20 1009  CEA 8.8*    Assessment and Plan:  70 y.o. female with past medical history of right colon cancer in 2017, status post surgery and chemoradiation.  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 Risks and benefits of image guided port-a-catheter placement was discussed with the patient including, but not limited to bleeding, infection, pneumothorax, or fibrin sheath development and need for additional procedures.  All of the patient's questions were answered, patient is agreeable to proceed. Consent signed and in chart.    Thank you for this interesting consult.  I greatly enjoyed meeting Brittney Spencer and look forward to participating in their care.  A copy of this report was sent to the requesting provider on this date.  Electronically Signed: D. Rowe Robert, PA-C 08/04/2020, 10:08 AM   I spent a total of  25 minutes   in face to face in clinical consultation, greater than 50% of which was counseling/coordinating care for Port-A-Cath placement

## 2020-08-04 NOTE — Discharge Instructions (Signed)
Interventional radiology phone numbers [phone] After hours [phone]  You have skin glue (dermabond) over your new port. Do not use the lidocaine cream (EMLA cream) over the skin glue until it has healed. The petroleum in the lidocaine cream will dissolve the skin glue resulting in an infection of your new port.  Use ice in a zip lock bag for 1-2 minutes over your new port before the cancer center nurses access your port.  Wound - May remove dressing and shower in 24 to 48 hours.  Keep site clean and dry.  Replace with bandaid as needed.  Do not submerge in tub or water until site healing well. If closed with glue, glue will flake off on its own.  IAfter completion of treatment, your provider should have you set up for monthly port flushes.     Implanted Port Insertion, Care After This sheet gives you information about how to care for yourself after your procedure. Your health care provider may also give you more specific instructions. If you have problems or questions, contact your health care provider. What can I expect after the procedure? After the procedure, it is common to have:  Discomfort at the port insertion site.  Bruising on the skin over the port. This should improve over 3-4 days. Follow these instructions at home: [facility] care  After your port is placed, you will get a manufacturer's information card. The card has information about your port. Keep this card with you at all times.  Take care of the port as told by your health care provider.   Make sure to remember what type of port you have. Incision care  Follow instructions from your health care provider about how to take care of your port insertion site. Make sure you: ? Wash your hands with soap and water before and after you change your bandage (dressing). If soap and water are not available, use hand sanitizer. ? You may remove the gauze and tegaderm on 08/05/20.  Leave skin glue in place. These skin closures  may need to stay in place for 2 weeks or longer. Check your port insertion site every day for signs of infection. Check for: ? Redness, swelling, or pain. ? Fluid or blood. ? Warmth. ? Pus or a bad smell.      Activity  Return to your normal activities as told by your health care provider. Ask your health care provider what activities are safe for you.  Do not lift anything that is heavier than 10 lb (4.5 kg), or the limit that you are told, until your health care provider says that it is safe. General instructions  Take over-the-counter and prescription medicines only as told by your health care provider.  Do not take baths, swim, or use a hot tub until your health care provider approves. You may shower 08/05/20 around 1 PM. Remove the gauze and tegaderm first.  Do not drive for 24 hours if you were given a sedative during your procedure.  Wear a medical alert bracelet in case of an emergency. This will tell any health care providers that you have a port.  Keep all follow-up visits as told by your health care provider. This is important. Contact a health care provider if:  You cannot flush your port with saline as directed, or you cannot draw blood from the port.  You have a fever or chills.  You have redness, swelling, or pain around your port insertion site.  You have fluid or blood  coming from your port insertion site.  Your port insertion site feels warm to the touch.  You have pus or a bad smell coming from the port insertion site. Get help right away if:  You have chest pain or shortness of breath.  You have bleeding from your port that you cannot control. Summary  Take care of the port as told by your health care provider. Keep the manufacturer's information card with you at all times.  Change your dressing as told by your health care provider.  Contact a health care provider if you have a fever or chills or if you have redness, swelling, or pain around your port  insertion site.  Keep all follow-up visits as told by your health care provider. This information is not intended to replace advice given to you by your health care provider. Make sure you discuss any questions you have with your health care provider. Document Revised: 01/13/2018 Document Reviewed: 01/13/2018 Elsevier Patient Education  2021 [employer].      Moderate Conscious Sedation, Adult, Care After This sheet gives you information about how to care for yourself after your procedure. Your health care provider may also give you more specific instructions. If you have problems or questions, contact your health care provider. What can I expect after the procedure? After the procedure, it is common to have:  Sleepiness for several hours.  Impaired judgment for several hours.  Difficulty with balance.  Vomiting if you eat too soon. Follow these instructions at home: For the time period you were told by your health care provider:  Rest.  Do not participate in activities where you could fall or become injured.  Do not drive or use machinery.  Do not drink alcohol.  Do not take sleeping pills or medicines that cause drowsiness.  Do not make important decisions or sign legal documents.  Do not take care of children on your own.      Eating and drinking  Follow the diet recommended by your health care provider.  Drink enough fluid to keep your urine pale yellow.  If you vomit: ? Drink water, juice, or soup when you can drink without vomiting. ? Make sure you have little or no nausea before eating solid foods.   General instructions  Take over-the-counter and prescription medicines only as told by your health care provider.  Have a responsible adult stay with you for the time you are told. It is important to have someone help care for you until you are awake and alert.  Do not smoke.  Keep all follow-up visits as told by your health care provider. This is  important. Contact a health care provider if:  You are still sleepy or having trouble with balance after 24 hours.  You feel light-headed.  You keep feeling nauseous or you keep vomiting.  You develop a rash.  You have a fever.  You have redness or swelling around the IV site. Get help right away if:  You have trouble breathing.  You have new-onset confusion at home. Summary  After the procedure, it is common to feel sleepy, have impaired judgment, or feel nauseous if you eat too soon.  Rest after you get home. Know the things you should not do after the procedure.  Follow the diet recommended by your health care provider and drink enough fluid to keep your urine pale yellow.  Get help right away if you have trouble breathing or new-onset confusion at home. This information is  not intended to replace advice given to you by your health care provider. Make sure you discuss any questions you have with your health care provider. Document Revised: 10/15/2019 Document Reviewed: 05/13/2019 Elsevier Patient Education  2021 [employer].

## 2020-08-07 ENCOUNTER — Other Ambulatory Visit: Payer: Self-pay

## 2020-08-07 ENCOUNTER — Inpatient Hospital Stay: Payer: Federal, State, Local not specified - PPO | Attending: Nurse Practitioner

## 2020-08-07 DIAGNOSIS — F419 Anxiety disorder, unspecified: Secondary | ICD-10-CM | POA: Insufficient documentation

## 2020-08-07 DIAGNOSIS — Z9221 Personal history of antineoplastic chemotherapy: Secondary | ICD-10-CM | POA: Insufficient documentation

## 2020-08-07 DIAGNOSIS — C182 Malignant neoplasm of ascending colon: Secondary | ICD-10-CM | POA: Insufficient documentation

## 2020-08-07 DIAGNOSIS — Z79899 Other long term (current) drug therapy: Secondary | ICD-10-CM | POA: Insufficient documentation

## 2020-08-07 DIAGNOSIS — Z5111 Encounter for antineoplastic chemotherapy: Secondary | ICD-10-CM | POA: Insufficient documentation

## 2020-08-07 DIAGNOSIS — T451X5A Adverse effect of antineoplastic and immunosuppressive drugs, initial encounter: Secondary | ICD-10-CM

## 2020-08-07 DIAGNOSIS — Z95828 Presence of other vascular implants and grafts: Secondary | ICD-10-CM

## 2020-08-07 DIAGNOSIS — E876 Hypokalemia: Secondary | ICD-10-CM | POA: Insufficient documentation

## 2020-08-07 DIAGNOSIS — Z5189 Encounter for other specified aftercare: Secondary | ICD-10-CM | POA: Insufficient documentation

## 2020-08-07 DIAGNOSIS — I10 Essential (primary) hypertension: Secondary | ICD-10-CM | POA: Insufficient documentation

## 2020-08-07 DIAGNOSIS — R112 Nausea with vomiting, unspecified: Secondary | ICD-10-CM

## 2020-08-07 DIAGNOSIS — Z923 Personal history of irradiation: Secondary | ICD-10-CM | POA: Insufficient documentation

## 2020-08-07 DIAGNOSIS — D649 Anemia, unspecified: Secondary | ICD-10-CM | POA: Insufficient documentation

## 2020-08-07 MED ORDER — LIDOCAINE-PRILOCAINE 2.5-2.5 % EX CREA: 1.0000 "application " | TOPICAL_CREAM | CUTANEOUS | 1 refills | Status: DC | PRN

## 2020-08-07 MED ORDER — PROCHLORPERAZINE MALEATE 10 MG PO TABS: 10.0000 mg | ORAL_TABLET | Freq: Four times a day (QID) | ORAL | 2 refills | Status: DC | PRN

## 2020-08-07 MED ORDER — ONDANSETRON HCL 8 MG PO TABS: 8.0000 mg | ORAL_TABLET | Freq: Three times a day (TID) | ORAL | 2 refills | Status: DC | PRN

## 2020-08-07 NOTE — Progress Notes (Signed)
[place]   [phone] [phone]) [phone]   Clinic Follow up Note   Patient Care Team: Patient, No Pcp Per as PCP - General (General Practice) [name], NP as Nurse Practitioner (Oncology) [name], MD as Consulting Physician (Oncology) [name], RN as Oncology Nurse Navigator  Date of Service:  08/10/2020  CHIEF COMPLAINT: Follow up stage IIIC right colon cancerand newly diagnosed rectal cancer  SUMMARY OF ONCOLOGIC HISTORY: Oncology History Overview Note  Cancer Staging Cancer of ascending colon [facility]) Staging form: Colon and Rectum, AJCC 7th Edition - Clinical stage from 02/07/2016: Stage IIIC (T4b, N1b, M0) - Signed by [name], MD on 03/04/2016    Cancer of ascending colon ([place])  10/25/2015 Imaging   CT ABD/PELVIS:  Inflammatory changes inferior to the cecal tip appear improved, there is still irregular soft tissue thickening of the cecal tip, and there are adjacent prominent lymph nodes in the ileocolonic mesentery, measuring 13 mm on image 49 and 8 mm on image 52. In addition, there is a 2.5 x 1.8 cm nodule on image 46 which has central low density. Therefore, these findings are moderately suspicious for an underlying cecal malignancy with perforation.    01/19/2016 Procedure   COLONOSCOPY per Dr. [name]: Fungating, ulcerated mass almost obstructing mid ascending colon   01/19/2016 Initial Biopsy   Diagnosis Surgical [P], cecal mass - INVASIVE ADENOCARCINOMA WITH ULCERATION. - SEE COMMENT.   02/05/2016 Tumor Marker   Patient's tumor was tested for the following markers: CEA Results of the tumor marker test revealed 5.7.   02/07/2016 Initial Diagnosis   Cancer of ascending colon ([place])   02/07/2016 Definitive Surgery   Laparoscopic assisted right hemicolectomy and right salpingo oopherectomy--Dr. [name]   02/07/2016 Pathologic Stage   p T4 N1b   2/43 nodes +   02/07/2016 Pathology Results   MMR normal; G2  adenocarcinoma;proximal & distal margins negative; soft tissue mass on pelvic sidewall + for adenocarcinoma with positive margin MSI Stable   03/08/2016 Imaging   CT chest negative for metastasis.    03/19/2016 - 04/25/2016 Radiation Therapy   Adjuvant irradiation, 50 gray in 28 fractions   03/19/2016 - 04/22/2016 Chemotherapy   Xeloda 1500 mg twice daily, started on 03/19/2016, dose reduced to 1000 mg twice daily from week 3 due to neutropenia, and patient stopped 3 days before last dose radiation due to difficulty swallowing the pill    05/20/2016 -  Adjuvant Chemotherapy   Patient declined adjuvant chemotherapy   09/16/2016 Imaging   CT CAP w Contrast 1. No evidence of local tumor recurrence at the ileocolic anastomosis. 2. No findings suspicious for metastatic disease in the chest, abdomen or pelvis. 3. Nonspecific trace free fluid in the pelvic cul-de-sac. 4. Stable solitary 3 mm right upper lobe pulmonary nodule, for which 6 month stability has been demonstrated, probably benign. 5. Additional findings include stable right posterior pericardial cyst and small calcified uterine fibroids.   05/13/2017 Imaging   CT CAP W Contrast 05/13/17 IMPRESSION: 1. No current findings of residual or recurrent malignancy. 2. Mild prominence of stool throughout the colon. Nondistended portions of the rectum. 3. Several tiny pulmonary nodules are stable from the earliest available comparison of 03/08/2016 and probably benign, but may merit surveillance. 4. Other imaging findings of potential clinical significance: Old granulomatous disease. Aortoiliac atherosclerotic vascular disease. Lumbar spondylosis and degenerative disc disease. Stable amount of trace free pelvic fluid.   04/27/2018 Imaging   04/27/2018 CT CAP IMPRESSION: Stable  exam. No evidence of recurrent or metastatic carcinoma within the chest, abdomen, or pelvis   04/19/2019 Imaging   CT CAP W Contrast  IMPRESSION: Chest  Impression:   1. No evidence of thoracic metastasis. 2. Stable small bilateral pulmonary nodules.   Abdomen / Pelvis Impression:   1. No evidence local colorectal carcinoma recurrence or metastasis in the abdomen pelvis. 2. Post RIGHT hemicolectomy.   Rectal cancer metastasized to liver ([place])  06/15/2020 Procedure   Screening Colonoscopy by Dr [name]  IMPRESSION - Decreased sphincter tone and internal hemorrhoids that prolapse with straining, but require manual replacement into the anal canal (Grade III) found on digital rectal exam. - Patent side-to-side ileo-colonic anastomosis, characterized by healthy appearing mucosa. - The examined portion of the ileum was normal. - One diminutive polyp in the proximal transverse colon, removed with a cold biopsy forceps. Resected and retrieved. - Likely malignant partially obstructing tumor in the mid rectum. Biopsied. Tattooed. - The examination was otherwise normal on direct and retroflexion views.   06/15/2020 Initial Biopsy   Diagnosis 1. Transverse Colon Polyp - HYPERPLASTIC POLYP 2. Rectum, biopsy - ADENOCARCINOMA ARISING IN A TUBULAR ADENOMA WITH HIGH-GRADE DYSPLASIA. SEE NOTE Diagnosis Note 2. Dr. [name] reviewed the case and concurs with the diagnosis. Dr. [name] was notified on 06/16/2020.   06/28/2020 Imaging   CT CAP  IMPRESSION: 1. New low-density focus in the anterior aspect of the lateral segment LEFT hepatic lobe measuring 1.2 x 1.0 cm, compatible with small metastatic lesion in the LEFT hepatic lobe. 2. Soft tissue in the RIGHT iliac fossa following RIGHT hemicolectomy invades the psoas musculature and is slowly enlarging over time, more linear on the prior study now highly concerning for recurrence/metastasis to this location. 3. Signs of enteritis, potentially post radiation changes of the small bowel. Tethered small bowel in the RIGHT lower quadrant shows focal thickening and narrowing suspicious for small  bowel involvement and developing partial obstruction though currently contrast passes beyond this point into the colon. 4. Rectal thickening in this patient with known rectal mass as described. 5. No evidence of metastatic disease in the chest. 6. Stable small pulmonary nodules. 7.  and aortic atherosclerosis.   Aortic Atherosclerosis (ICD10-I70.0) and Emphysema (ICD10-J43.9).   07/04/2020 Initial Diagnosis   Rectal cancer metastasized to liver ([place])   07/12/2020 PET scan   IMPRESSION: 1. Exam positive for FDG avid rectal tumor which corresponds to the recent colonoscopy findings. 2. FDG avid soft tissue mass within the right iliac fossa is noted and consistent with local tumor recurrence from previous ascending colon tumor. 3. Lateral segment left lobe of liver lesion is FDG avid concerning for liver metastasis. 4. No specific findings identified to suggest metastatic disease to the chest.   08/10/2020 -  Chemotherapy   First-line FOLFIRI q2weeks starting 08/10/20       CURRENT THERAPY:  First-line FOLFIRI q2weeks starting 08/10/20  INTERVAL HISTORY:  Brittney Spencer is here for a follow up. She presents to the clinic alone. She had port placed a few days ago, the procedure went very well. She denies any pain or other new symptoms, has mild fatigue, otherwise doing well at home.  All other systems were reviewed with the patient and are negative.  MEDICAL HISTORY:  Past Medical History:  Diagnosis Date  . AAA (abdominal aortic aneurysm) ([facility])    infrarenal 4.1 cmper s-9-19 scan on chart  . Anemia    hx of  . Anxiety    has PRN meds  .  Colon cancer ([place]) 2017   RIGHT hemi colectomy-s/p sx  . GERD (gastroesophageal reflux disease)    OTC meds/diet control  . Hypertension    on meds  . Retinal detachment, right 09/2019  . Vitamin D deficiency     SURGICAL HISTORY: Past Surgical History:  Procedure Laterality Date  . COLONOSCOPY  2018   HD-hams  . COLONSCOPY   12/2015  . IR IMAGING GUIDED PORT INSERTION  08/04/2020  . LAPAROSCOPIC RIGHT HEMI COLECTOMY Right 02/07/2016   Procedure: LAPAROSCOPIC ASSISTED RIGHT HEMI COLECTOMY AND RIGHT SALPINGO OOPHERECTOMY;  Surgeon: [name], MD;  Location: [facility];  Service: General;  Laterality: Right;  . RETINAL DETACHMENT SURGERY  09/2019    I have reviewed the social history and family history with the patient and they are unchanged from previous note.  ALLERGIES:  is allergic to fish allergy, peanut-containing drug products, soy allergy, and buspirone.  MEDICATIONS:  Current Outpatient Medications  Medication Sig Dispense Refill  . ALPRAZolam (XANAX) 0.25 MG tablet Take 1 tablet (0.25 mg total) by mouth daily as needed for anxiety. 30 tablet 0  . Cholecalciferol (VITAMIN D3 PO) Take by mouth daily.    Marland Kitchen docusate sodium (COLACE) 100 MG capsule 1 capsule as needed    . lidocaine-prilocaine (EMLA) cream Apply 1 application topically as needed. 30 g 1  . NORVASC 2.5 MG tablet Take 1 tablet (2.5 mg total) by mouth daily. 30 tablet 3  . ondansetron (ZOFRAN) 8 MG tablet Take 1 tablet (8 mg total) by mouth every 8 (eight) hours as needed for nausea or vomiting. 20 tablet 2  . potassium chloride (KLOR-CON) 10 MEQ tablet Take 1 tablet (10 mEq total) by mouth daily. 10 tablet 0  . prochlorperazine (COMPAZINE) 10 MG tablet Take 1 tablet (10 mg total) by mouth every 6 (six) hours as needed for nausea or vomiting. 30 tablet 2   Current Facility-Administered Medications  Medication Dose Route Frequency Provider Last Rate Last Admin  . 0.9 %  sodium chloride infusion  500 mL Intravenous Once Doran Stabler, MD        PHYSICAL EXAMINATION: ECOG PERFORMANCE STATUS: 1 - Symptomatic but completely ambulatory  Vitals:   08/10/20 1134  BP: (!) 145/75  Pulse: 88  Resp: 16  Temp: (!) 97.5 F (36.4 C)  SpO2: 100%   Filed Weights   08/10/20 1134  Weight: 117 lb (53.1 kg)    GENERAL:alert, no distress and  comfortable SKIN: skin color, texture, turgor are normal, no rashes or significant lesions, port site looks clean  EYES: normal, Conjunctiva are pink and non-injected, sclera clear NECK: supple, thyroid normal size, non-tender, without nodularity LYMPH:  no palpable lymphadenopathy in the cervical, axillary  LUNGS: clear to auscultation and percussion with normal breathing effort HEART: regular rate & rhythm and no murmurs and no lower extremity edema ABDOMEN:abdomen soft, non-tender and normal bowel sounds Musculoskeletal:no cyanosis of digits and no clubbing  NEURO: alert & oriented x 3 with fluent speech, no focal motor/sensory deficits  LABORATORY DATA:  I have reviewed the data as listed CBC Latest Ref Rng & Units 08/10/2020 07/28/2020 06/15/2020  WBC 4.0 - 10.5 K/uL 3.3(L) 2.7(L) 2.7 Repeated and verified X2.(L)  Hemoglobin 12.0 - 15.0 g/dL 10.2(L) 10.8(L) 10.9(L)  Hematocrit 36.0 - 46.0 % 31.8(L) 35.1(L) 33.6(L)  Platelets 150 - 400 K/uL 160 202 198.0     CMP Latest Ref Rng & Units 08/10/2020 07/28/2020 06/15/2020  Glucose 70 - 99 mg/dL 94 84  85  BUN 8 - 23 mg/dL '13 17 6  ' Creatinine 0.44 - 1.00 mg/dL 0.77 0.89 0.72  Sodium 135 - 145 mmol/L 136 139 134(L)  Potassium 3.5 - 5.1 mmol/L 3.7 3.3(L) 4.0  Chloride 98 - 111 mmol/L 102 104 100  CO2 22 - 32 mmol/L '27 27 25  ' Calcium 8.9 - 10.3 mg/dL 9.5 9.1 9.0  Total Protein 6.5 - 8.1 g/dL 7.1 7.5 7.0  Total Bilirubin 0.3 - 1.2 mg/dL 0.5 0.5 0.6  Alkaline Phos 38 - 126 U/L 74 71 63  AST 15 - 41 U/L '20 17 18  ' ALT 0 - 44 U/L '8 8 9      ' RADIOGRAPHIC STUDIES: I have personally reviewed the radiological images as listed and agreed with the findings in the report. No results found.   ASSESSMENT & PLAN:  SHOLONDA JOBST is a 70 y.o. female with   1. Rectal adenocarcinoma, withliverand right pelvic metastasis, recurrence from previous colon cancer vs new primary -she wasfound to have a rectal mas on routine screening  colonoscopy in 05/2020. She had a mild constipation, no other symptoms. -Her12/16/21rectal mass biopsy showed invasive adenocarcinoma, arising from a tubular adenoma -Her 07/12/20 PET showed positive uptake of known rectal tumor and soft tissue mass within the right iliac fossa indicating recurrent prior colon cancer. There is alsoleft lobe of liver lesion is FDG avid concerning for liver metastasis. -Since her PET scan are pretty convincing for metastatic disease, pt declined liver biopsy. I agreed.  -I discussed whether rectalcancer orrecurrent colon cancer, her cancer isprobably not resectable andnot curable at this stage, based on our tumor board discussion with our local surgeons.We discussed option of [facility] at [facility].She declined outside surgical opinion.  -I again discussed this cancer can be from prior colon cancer or a new primary cancer.  -I recommended systemic treatment with first-line FOLFIRI q2weeks starting 08/10/20. Goal of care is palliative to prolong her life.   Chemo consent obtained. -her FO is still pending, will see if she is eligible for EGFR inhibitor -Lab reviewed, adequate for treatment. Will proceed for cycle FOLFIRI today, with dose reduction to 25%-20% due to her age and concerns for AEs -Virtual visit next week for toxicity checkup    2. Cancer of ascending colon, pT4bN1bM0, stage IIIC, MSI-stable, (+) surgical margins at pelvic wall -She was diagnosed in 12/2015. She is s/p right hemicolectomy with rightsalpingo oophorectomyand adjuvant ChemoRT.  -I previously strongly recommend her to consider adjuvant chemotherapy, due to her extremely high risk of recurrence. After lengthy discussion, patient declined chemotherapy due to the concern of side effects and impact on her quality of life. -Her 04/2019 CT scan was NED -With recent rectal cancer diagnosis, herPET from 07/12/20 indicates soft tissue mass within the right iliac fossa is noted and consistent with  local tumor recurrence from previous ascendingcolon tumor. I discussed with growth this can become symptomatic.  -She declined option of liver biopsy. She may proceed with treatment for her rectal cancer.  3. Mild Anemia  -Has been stable and mild for the past year. -worse lately due to her newly diagnosed rectal cancer. Will monitor.  4. HTN, Anxiety -She'll follow-up with her primary care physician and continue medication -She uses Xanax as needed. -stable, not worse since her recent rectal cancer diagnosis  5. Hypokalemia  -K at 3.3 on 07/28/20. I started her on oral potassium once daily and encouraged her to increase potassium rich foods in diet.   Plan  -Lab reviewed,  adequate for treatment, will proceed for cycle FOLFIRI today with 20 to 25% dose reduction, with pegfilgrastim on day three due to her baseline mild leukopenia -Lab, flush, f/u and FOLFIRI and bevacizumab in 2 and 4 weeks     No problem-specific Assessment & Plan notes found for this encounter.   No orders of the defined types were placed in this encounter.  All questions were answered. The patient knows to call the clinic with any problems, questions or concerns. No barriers to learning was detected. The total time spent in the appointment was 30 minutes.     [name], MD 08/10/2020   I, Joslyn Devon, am acting as scribe for [name], MD.   I have reviewed the above documentation for accuracy and completeness, and I agree with the above.

## 2020-08-09 ENCOUNTER — Telehealth: Payer: Self-pay | Admitting: *Deleted

## 2020-08-09 NOTE — Telephone Encounter (Signed)
Received call from pt asking about when/how to use EMLA cream tomorrow.  Returned call & discussed not using cream tomorrow since port @ 1 wk old.  Informed about using ice. Teaching done regarding how to use EMLA cream for next treatment in two wks. Discussed anti-nausea meds.  Pt asked if she can take her xanax before her treatment due to anxiety. Informed that would be OK but to let nurse know that she took this if she does.  She expressed understanding.

## 2020-08-10 ENCOUNTER — Encounter: Payer: Self-pay | Admitting: Hematology

## 2020-08-10 ENCOUNTER — Inpatient Hospital Stay: Payer: Federal, State, Local not specified - PPO

## 2020-08-10 ENCOUNTER — Other Ambulatory Visit: Payer: Self-pay

## 2020-08-10 ENCOUNTER — Ambulatory Visit: Payer: Federal, State, Local not specified - PPO | Admitting: Hematology

## 2020-08-10 ENCOUNTER — Other Ambulatory Visit: Payer: Federal, State, Local not specified - PPO

## 2020-08-10 ENCOUNTER — Inpatient Hospital Stay: Payer: Federal, State, Local not specified - PPO | Admitting: Hematology

## 2020-08-10 VITALS — BP 145/75 | HR 88 | Temp 97.5°F | Resp 16 | Ht 63.0 in | Wt 117.0 lb

## 2020-08-10 DIAGNOSIS — F419 Anxiety disorder, unspecified: Secondary | ICD-10-CM | POA: Diagnosis not present

## 2020-08-10 DIAGNOSIS — Z95828 Presence of other vascular implants and grafts: Secondary | ICD-10-CM

## 2020-08-10 DIAGNOSIS — C182 Malignant neoplasm of ascending colon: Secondary | ICD-10-CM

## 2020-08-10 DIAGNOSIS — Z5189 Encounter for other specified aftercare: Secondary | ICD-10-CM | POA: Diagnosis not present

## 2020-08-10 DIAGNOSIS — Z923 Personal history of irradiation: Secondary | ICD-10-CM | POA: Diagnosis not present

## 2020-08-10 DIAGNOSIS — E876 Hypokalemia: Secondary | ICD-10-CM | POA: Diagnosis not present

## 2020-08-10 DIAGNOSIS — D649 Anemia, unspecified: Secondary | ICD-10-CM | POA: Diagnosis not present

## 2020-08-10 DIAGNOSIS — I10 Essential (primary) hypertension: Secondary | ICD-10-CM | POA: Diagnosis not present

## 2020-08-10 DIAGNOSIS — C787 Secondary malignant neoplasm of liver and intrahepatic bile duct: Secondary | ICD-10-CM

## 2020-08-10 DIAGNOSIS — D500 Iron deficiency anemia secondary to blood loss (chronic): Secondary | ICD-10-CM

## 2020-08-10 DIAGNOSIS — Z9221 Personal history of antineoplastic chemotherapy: Secondary | ICD-10-CM | POA: Diagnosis not present

## 2020-08-10 DIAGNOSIS — C20 Malignant neoplasm of rectum: Secondary | ICD-10-CM

## 2020-08-10 DIAGNOSIS — Z79899 Other long term (current) drug therapy: Secondary | ICD-10-CM | POA: Diagnosis not present

## 2020-08-10 DIAGNOSIS — Z5111 Encounter for antineoplastic chemotherapy: Secondary | ICD-10-CM | POA: Diagnosis present

## 2020-08-10 LAB — CBC WITH DIFFERENTIAL (CANCER CENTER ONLY)
Abs Immature Granulocytes: 0.01 10*3/uL (ref 0.00–0.07)
Basophils Absolute: 0 10*3/uL (ref 0.0–0.1)
Basophils Relative: 1 %
Eosinophils Absolute: 0.2 10*3/uL (ref 0.0–0.5)
Eosinophils Relative: 5 %
HCT: 31.8 % — ABNORMAL LOW (ref 36.0–46.0)
Hemoglobin: 10.2 g/dL — ABNORMAL LOW (ref 12.0–15.0)
Immature Granulocytes: 0 %
Lymphocytes Relative: 28 %
Lymphs Abs: 0.9 10*3/uL (ref 0.7–4.0)
MCH: 27.3 pg (ref 26.0–34.0)
MCHC: 32.1 g/dL (ref 30.0–36.0)
MCV: 85 fL (ref 80.0–100.0)
Monocytes Absolute: 0.3 10*3/uL (ref 0.1–1.0)
Monocytes Relative: 10 %
Neutro Abs: 1.9 10*3/uL (ref 1.7–7.7)
Neutrophils Relative %: 56 %
Platelet Count: 160 10*3/uL (ref 150–400)
RBC: 3.74 MIL/uL — ABNORMAL LOW (ref 3.87–5.11)
RDW: 13.7 % (ref 11.5–15.5)
WBC Count: 3.3 10*3/uL — ABNORMAL LOW (ref 4.0–10.5)
nRBC: 0 % (ref 0.0–0.2)

## 2020-08-10 LAB — COMPREHENSIVE METABOLIC PANEL
ALT: 8 U/L (ref 0–44)
AST: 20 U/L (ref 15–41)
Albumin: 3.7 g/dL (ref 3.5–5.0)
Alkaline Phosphatase: 74 U/L (ref 38–126)
Anion gap: 7 (ref 5–15)
BUN: 13 mg/dL (ref 8–23)
CO2: 27 mmol/L (ref 22–32)
Calcium: 9.5 mg/dL (ref 8.9–10.3)
Chloride: 102 mmol/L (ref 98–111)
Creatinine, Ser: 0.77 mg/dL (ref 0.44–1.00)
GFR, Estimated: >60 mL/min (ref >60)
Glucose, Bld: 94 mg/dL (ref 70–99)
Potassium: 3.7 mmol/L (ref 3.5–5.1)
Sodium: 136 mmol/L (ref 135–145)
Total Bilirubin: 0.5 mg/dL (ref 0.3–1.2)
Total Protein: 7.1 g/dL (ref 6.5–8.1)

## 2020-08-10 MED ORDER — SODIUM CHLORIDE 0.9 % IV SOLN
Freq: Once | INTRAVENOUS | Status: AC
  Filled 2020-08-10: qty 250

## 2020-08-10 MED ORDER — ATROPINE SULFATE 1 MG/ML IJ SOLN
INTRAMUSCULAR | Status: AC
  Filled 2020-08-10: qty 1

## 2020-08-10 MED ORDER — SODIUM CHLORIDE 0.9 % IV SOLN
135.0000 mg/m2 | Freq: Once | INTRAVENOUS | Status: AC
  Administered 2020-08-10: 200 mg via INTRAVENOUS
  Filled 2020-08-10: qty 10

## 2020-08-10 MED ORDER — SODIUM CHLORIDE 0.9 % IV SOLN
10.0000 mg | Freq: Once | INTRAVENOUS | Status: AC
  Administered 2020-08-10: 10 mg via INTRAVENOUS
  Filled 2020-08-10: qty 10

## 2020-08-10 MED ORDER — SODIUM CHLORIDE 0.9 % IV SOLN
400.0000 mg/m2 | Freq: Once | INTRAVENOUS | Status: AC
  Administered 2020-08-10: 620 mg via INTRAVENOUS
  Filled 2020-08-10: qty 31

## 2020-08-10 MED ORDER — SODIUM CHLORIDE 0.9 % IV SOLN
2000.0000 mg/m2 | INTRAVENOUS | Status: DC
  Administered 2020-08-10: 3100 mg via INTRAVENOUS
  Filled 2020-08-10: qty 62

## 2020-08-10 MED ORDER — SODIUM CHLORIDE 0.9% FLUSH
10.0000 mL | Freq: Once | INTRAVENOUS | Status: AC
Start: 2020-08-10 — End: 2020-08-10
  Administered 2020-08-10: 10 mL
  Filled 2020-08-10: qty 10

## 2020-08-10 MED ORDER — PALONOSETRON HCL INJECTION 0.25 MG/5ML
INTRAVENOUS | Status: AC
  Filled 2020-08-10: qty 5

## 2020-08-10 MED ORDER — SODIUM CHLORIDE 0.9% FLUSH
10.0000 mL | INTRAVENOUS | Status: DC | PRN
  Filled 2020-08-10: qty 10

## 2020-08-10 MED ORDER — PALONOSETRON HCL INJECTION 0.25 MG/5ML
0.2500 mg | Freq: Once | INTRAVENOUS | Status: AC
  Administered 2020-08-10: 0.25 mg via INTRAVENOUS

## 2020-08-10 MED ORDER — ATROPINE SULFATE 1 MG/ML IJ SOLN
0.5000 mg | Freq: Once | INTRAMUSCULAR | Status: AC | PRN
  Administered 2020-08-10: 0.5 mg via INTRAVENOUS

## 2020-08-10 MED ORDER — HEPARIN SOD (PORK) LOCK FLUSH 100 UNIT/ML IV SOLN
500.0000 [IU] | Freq: Once | INTRAVENOUS | Status: DC | PRN
  Filled 2020-08-10: qty 5

## 2020-08-10 NOTE — Progress Notes (Signed)
Per Cassie Hellingoetter, PA ok for Xgeva today with calcium 8.7

## 2020-08-10 NOTE — Patient Instructions (Addendum)
[place] Discharge Instructions for Patients Receiving Chemotherapy  Today you received the following chemotherapy agents: Irinotecan, Leucovorin, and Fluorouracil  To help prevent nausea and vomiting after your treatment, we encourage you to take your nausea medication as directed by your MD.   If you develop nausea and vomiting that is not controlled by your nausea medication, call the clinic.   BELOW ARE SYMPTOMS THAT SHOULD BE REPORTED IMMEDIATELY:  *FEVER GREATER THAN 100.5 F  *CHILLS WITH OR WITHOUT FEVER  NAUSEA AND VOMITING THAT IS NOT CONTROLLED WITH YOUR NAUSEA MEDICATION  *UNUSUAL SHORTNESS OF BREATH  *UNUSUAL BRUISING OR BLEEDING  TENDERNESS IN MOUTH AND THROAT WITH OR WITHOUT PRESENCE OF ULCERS  *URINARY PROBLEMS  *BOWEL PROBLEMS  UNUSUAL RASH Items with * indicate a potential emergency and should be followed up as soon as possible.  Feel free to call the clinic should you have any questions or concerns. The clinic phone number is [phone].  Please show the [place] at check-in to the Emergency Department and triage nurse.  Irinotecan injection What is this medicine? IRINOTECAN (ir in oh TEE kan ) is a chemotherapy drug. It is used to treat colon and rectal cancer. This medicine may be used for other purposes; ask your health care provider or pharmacist if you have questions. COMMON BRAND NAME(S): Camptosar What should I tell my health care provider before I take this medicine? They need to know if you have any of these conditions:  dehydration  diarrhea  infection (especially a virus infection such as chickenpox, cold sores, or herpes)  liver disease  low blood counts, like low white cell, platelet, or red cell counts  low levels of calcium, magnesium, or potassium in the blood  recent or ongoing radiation therapy  an unusual or allergic reaction to irinotecan, other medicines, foods, dyes, or  preservatives  pregnant or trying to get pregnant  breast-feeding How should I use this medicine? This drug is given as an infusion into a vein. It is administered in a hospital or clinic by a specially trained health care professional. Talk to your pediatrician regarding the use of this medicine in children. Special care may be needed. Overdosage: If you think you have taken too much of this medicine contact a poison control center or emergency room at once. NOTE: This medicine is only for you. Do not share this medicine with others. What if I miss a dose? It is important not to miss your dose. Call your doctor or health care professional if you are unable to keep an appointment. What may interact with this medicine? Do not take this medicine with any of the following medications:  cobicistat  itraconazole This medicine may interact with the following medications:  antiviral medicines for HIV or AIDS  certain antibiotics like rifampin or rifabutin  certain medicines for fungal infections like ketoconazole, posaconazole, and voriconazole  certain medicines for seizures like carbamazepine, phenobarbital, phenotoin  clarithromycin  gemfibrozil  nefazodone  St. John's Wort This list may not describe all possible interactions. Give your health care provider a list of all the medicines, herbs, non-prescription drugs, or dietary supplements you use. Also tell them if you smoke, drink alcohol, or use illegal drugs. Some items may interact with your medicine. What should I watch for while using this medicine? Your condition will be monitored carefully while you are receiving this medicine. You will need important blood work done while you are taking this medicine. This drug may make  you feel generally unwell. This is not uncommon, as chemotherapy can affect healthy cells as well as cancer cells. Report any side effects. Continue your course of treatment even though you feel ill unless  your doctor tells you to stop. In some cases, you may be given additional medicines to help with side effects. Follow all directions for their use. You may get drowsy or dizzy. Do not drive, use machinery, or do anything that needs mental alertness until you know how this medicine affects you. Do not stand or sit up quickly, especially if you are an older patient. This reduces the risk of dizzy or fainting spells. Call your health care professional for advice if you get a fever, chills, or sore throat, or other symptoms of a cold or flu. Do not treat yourself. This medicine decreases your body's ability to fight infections. Try to avoid being around people who are sick. Avoid taking products that contain aspirin, acetaminophen, ibuprofen, naproxen, or ketoprofen unless instructed by your doctor. These medicines may hide a fever. This medicine may increase your risk to bruise or bleed. Call your doctor or health care professional if you notice any unusual bleeding. Be careful brushing and flossing your teeth or using a toothpick because you may get an infection or bleed more easily. If you have any dental work done, tell your dentist you are receiving this medicine. Do not become pregnant while taking this medicine or for 6 months after stopping it. Women should inform their health care professional if they wish to become pregnant or think they might be pregnant. Men should not father a child while taking this medicine and for 3 months after stopping it. There is potential for serious side effects to an unborn child. Talk to your health care professional for more information. Do not breast-feed an infant while taking this medicine or for 7 days after stopping it. This medicine has caused ovarian failure in some women. This medicine may make it more difficult to get pregnant. Talk to your health care professional if you are concerned about your fertility. This medicine has caused decreased sperm counts in  some men. This may make it more difficult to father a child. Talk to your health care professional if you are concerned about your fertility. What side effects may I notice from receiving this medicine? Side effects that you should report to your doctor or health care professional as soon as possible:  allergic reactions like skin rash, itching or hives, swelling of the face, lips, or tongue  chest pain  diarrhea  flushing, runny nose, sweating during infusion  low blood counts - this medicine may decrease the number of white blood cells, red blood cells and platelets. You may be at increased risk for infections and bleeding.  nausea, vomiting  pain, swelling, warmth in the leg  signs of decreased platelets or bleeding - bruising, pinpoint red spots on the skin, black, tarry stools, blood in the urine  signs of infection - fever or chills, cough, sore throat, pain or difficulty passing urine  signs of decreased red blood cells - unusually weak or tired, fainting spells, lightheadedness Side effects that usually do not require medical attention (report to your doctor or health care professional if they continue or are bothersome):  constipation  hair loss  headache  loss of appetite  mouth sores  stomach pain This list may not describe all possible side effects. Call your doctor for medical advice about side effects. You may report side  effects to FDA at 1-800-FDA-1088. Where should I keep my medicine? This drug is given in a hospital or clinic and will not be stored at home. NOTE: This sheet is a summary. It may not cover all possible information. If you have questions about this medicine, talk to your doctor, pharmacist, or health care provider.  2021 Elsevier/Gold Standard (2019-05-18 17:46:13) Leucovorin injection What is this medicine? LEUCOVORIN (loo koe VOR in) is used to prevent or treat the harmful effects of some medicines. This medicine is used to treat anemia  caused by a low amount of folic acid in the body. It is also used with 5-fluorouracil (5-FU) to treat colon cancer. This medicine may be used for other purposes; ask your health care provider or pharmacist if you have questions. What should I tell my health care provider before I take this medicine? They need to know if you have any of these conditions:  anemia from low levels of vitamin B-12 in the blood  an unusual or allergic reaction to leucovorin, folic acid, other medicines, foods, dyes, or preservatives  pregnant or trying to get pregnant  breast-feeding How should I use this medicine? This medicine is for injection into a muscle or into a vein. It is given by a health care professional in a hospital or clinic setting. Talk to your pediatrician regarding the use of this medicine in children. Special care may be needed. Overdosage: If you think you have taken too much of this medicine contact a poison control center or emergency room at once. NOTE: This medicine is only for you. Do not share this medicine with others. What if I miss a dose? This does not apply. What may interact with this medicine?  capecitabine  fluorouracil  phenobarbital  phenytoin  primidone  trimethoprim-sulfamethoxazole This list may not describe all possible interactions. Give your health care provider a list of all the medicines, herbs, non-prescription drugs, or dietary supplements you use. Also tell them if you smoke, drink alcohol, or use illegal drugs. Some items may interact with your medicine. What should I watch for while using this medicine? Your condition will be monitored carefully while you are receiving this medicine. This medicine may increase the side effects of 5-fluorouracil, 5-FU. Tell your doctor or health care professional if you have diarrhea or mouth sores that do not get better or that get worse. What side effects may I notice from receiving this medicine? Side effects that you  should report to your doctor or health care professional as soon as possible:  allergic reactions like skin rash, itching or hives, swelling of the face, lips, or tongue  breathing problems  fever, infection  mouth sores  unusual bleeding or bruising  unusually weak or tired Side effects that usually do not require medical attention (report to your doctor or health care professional if they continue or are bothersome):  constipation or diarrhea  loss of appetite  nausea, vomiting This list may not describe all possible side effects. Call your doctor for medical advice about side effects. You may report side effects to FDA at 1-800-FDA-1088. Where should I keep my medicine? This drug is given in a hospital or clinic and will not be stored at home. NOTE: This sheet is a summary. It may not cover all possible information. If you have questions about this medicine, talk to your doctor, pharmacist, or health care provider.  2021 Elsevier/Gold Standard (2007-12-22 16:50:29)  Fluorouracil, 5-FU injection What is this medicine? FLUOROURACIL, 5-FU (flure oh  YOOR a sil) is a chemotherapy drug. It slows the growth of cancer cells. This medicine is used to treat many types of cancer like breast cancer, colon or rectal cancer, pancreatic cancer, and stomach cancer. This medicine may be used for other purposes; ask your health care provider or pharmacist if you have questions. COMMON BRAND NAME(S): Adrucil What should I tell my health care provider before I take this medicine? They need to know if you have any of these conditions:  blood disorders  dihydropyrimidine dehydrogenase (DPD) deficiency  infection (especially a virus infection such as chickenpox, cold sores, or herpes)  kidney disease  liver disease  malnourished, poor nutrition  recent or ongoing radiation therapy  an unusual or allergic reaction to fluorouracil, other chemotherapy, other medicines, foods, dyes, or  preservatives  pregnant or trying to get pregnant  breast-feeding How should I use this medicine? This drug is given as an infusion or injection into a vein. It is administered in a hospital or clinic by a specially trained health care professional. Talk to your pediatrician regarding the use of this medicine in children. Special care may be needed. Overdosage: If you think you have taken too much of this medicine contact a poison control center or emergency room at once. NOTE: This medicine is only for you. Do not share this medicine with others. What if I miss a dose? It is important not to miss your dose. Call your doctor or health care professional if you are unable to keep an appointment. What may interact with this medicine? Do not take this medicine with any of the following medications:  live virus vaccines This medicine may also interact with the following medications:  medicines that treat or prevent blood clots like warfarin, enoxaparin, and dalteparin This list may not describe all possible interactions. Give your health care provider a list of all the medicines, herbs, non-prescription drugs, or dietary supplements you use. Also tell them if you smoke, drink alcohol, or use illegal drugs. Some items may interact with your medicine. What should I watch for while using this medicine? Visit your doctor for checks on your progress. This drug may make you feel generally unwell. This is not uncommon, as chemotherapy can affect healthy cells as well as cancer cells. Report any side effects. Continue your course of treatment even though you feel ill unless your doctor tells you to stop. In some cases, you may be given additional medicines to help with side effects. Follow all directions for their use. Call your doctor or health care professional for advice if you get a fever, chills or sore throat, or other symptoms of a cold or flu. Do not treat yourself. This drug decreases your body's  ability to fight infections. Try to avoid being around people who are sick. This medicine may increase your risk to bruise or bleed. Call your doctor or health care professional if you notice any unusual bleeding. Be careful brushing and flossing your teeth or using a toothpick because you may get an infection or bleed more easily. If you have any dental work done, tell your dentist you are receiving this medicine. Avoid taking products that contain aspirin, acetaminophen, ibuprofen, naproxen, or ketoprofen unless instructed by your doctor. These medicines may hide a fever. Do not become pregnant while taking this medicine. Women should inform their doctor if they wish to become pregnant or think they might be pregnant. There is a potential for serious side effects to an unborn child. Talk to  your health care professional or pharmacist for more information. Do not breast-feed an infant while taking this medicine. Men should inform their doctor if they wish to father a child. This medicine may lower sperm counts. Do not treat diarrhea with over the counter products. Contact your doctor if you have diarrhea that lasts more than 2 days or if it is severe and watery. This medicine can make you more sensitive to the sun. Keep out of the sun. If you cannot avoid being in the sun, wear protective clothing and use sunscreen. Do not use sun lamps or tanning beds/booths. What side effects may I notice from receiving this medicine? Side effects that you should report to your doctor or health care professional as soon as possible:  allergic reactions like skin rash, itching or hives, swelling of the face, lips, or tongue  low blood counts - this medicine may decrease the number of white blood cells, red blood cells and platelets. You may be at increased risk for infections and bleeding.  signs of infection - fever or chills, cough, sore throat, pain or difficulty passing urine  signs of decreased platelets or  bleeding - bruising, pinpoint red spots on the skin, black, tarry stools, blood in the urine  signs of decreased red blood cells - unusually weak or tired, fainting spells, lightheadedness  breathing problems  changes in vision  chest pain  mouth sores  nausea and vomiting  pain, swelling, redness at site where injected  pain, tingling, numbness in the hands or feet  redness, swelling, or sores on hands or feet  stomach pain  unusual bleeding Side effects that usually do not require medical attention (report to your doctor or health care professional if they continue or are bothersome):  changes in finger or toe nails  diarrhea  dry or itchy skin  hair loss  headache  loss of appetite  sensitivity of eyes to the light  stomach upset  unusually teary eyes This list may not describe all possible side effects. Call your doctor for medical advice about side effects. You may report side effects to FDA at 1-800-FDA-1088. Where should I keep my medicine? This drug is given in a hospital or clinic and will not be stored at home. NOTE: This sheet is a summary. It may not cover all possible information. If you have questions about this medicine, talk to your doctor, pharmacist, or health care provider.  2021 Elsevier/Gold Standard (2019-05-18 15:00:03)  [facility] Discharge Instructions for Patients receiving Home Portable Chemo Pump    **The bag should finish at 46 hours, 96 hours or 7 days. For example, if your pump is scheduled for 46 hours and it was put on at 4pm, it should finish at 2 pm the day it is scheduled to come off regardless of your appointment time.    Estimated time to finish   _________________________ (Have your nurse fill in)     ** if the display on your pump reads "Low Volume" and it is beeping, take the batteries out of the pump and come to the cancer center for it to be taken off.   **If the pump alarms go off prior to the pump  reading "Low Volume" then call the [phone] and someone can assist you.  **If the plunger comes out and the bag fluid is running out, please use your chemo spill kit to clean up the spill. Do not use paper towels or other house hold products.  **  If you have problems or questions regarding your pump, please call either the [phone] or the cancer center Monday-Friday 8:00am-4:30pm at [phone] and we will assist you.  If you are unable to get assistance then go to [facility] Emergency Room, ask the staff to contact the IV team for assistance.

## 2020-08-10 NOTE — Progress Notes (Signed)
Per Dr. Burr Medico, okay to increase rate of fluorouracil pump in order for infusion to end at 1330 on Saturday, 08/12/2020.

## 2020-08-10 NOTE — Progress Notes (Signed)
Met with patient at registration to introduce myself as Financial Resource Specialist and to offer available resources.  Discussed one-time $1000 [employer] grant and qualifications to assist with personal expenses while going through treatment.  Gave her my card if interested in applying and for any additional financial questions or concerns. 

## 2020-08-11 ENCOUNTER — Telehealth: Payer: Self-pay | Admitting: *Deleted

## 2020-08-11 ENCOUNTER — Telehealth: Payer: Self-pay | Admitting: Hematology

## 2020-08-11 NOTE — Telephone Encounter (Signed)
Rescheduled infusion appointment closer to provider visit w/permission. Patient is aware of changes.

## 2020-08-11 NOTE — Telephone Encounter (Signed)
Scheduled follow-up appointments per 2/10 los. Patient is aware. 

## 2020-08-12 ENCOUNTER — Other Ambulatory Visit: Payer: Self-pay

## 2020-08-12 ENCOUNTER — Inpatient Hospital Stay: Payer: Federal, State, Local not specified - PPO

## 2020-08-12 VITALS — BP 122/67 | HR 78 | Temp 97.5°F | Resp 17

## 2020-08-12 DIAGNOSIS — Z5111 Encounter for antineoplastic chemotherapy: Secondary | ICD-10-CM | POA: Diagnosis not present

## 2020-08-12 DIAGNOSIS — C20 Malignant neoplasm of rectum: Secondary | ICD-10-CM

## 2020-08-12 MED ORDER — HEPARIN SOD (PORK) LOCK FLUSH 100 UNIT/ML IV SOLN
500.0000 [IU] | Freq: Once | INTRAVENOUS | Status: AC | PRN
  Administered 2020-08-12: 500 [IU]
  Filled 2020-08-12: qty 5

## 2020-08-12 MED ORDER — PEGFILGRASTIM-JMDB 6 MG/0.6ML ~~LOC~~ SOSY
PREFILLED_SYRINGE | SUBCUTANEOUS | Status: AC
  Filled 2020-08-12: qty 0.6

## 2020-08-12 MED ORDER — PEGFILGRASTIM-JMDB 6 MG/0.6ML ~~LOC~~ SOSY
6.0000 mg | PREFILLED_SYRINGE | Freq: Once | SUBCUTANEOUS | Status: AC
  Administered 2020-08-12: 6 mg via SUBCUTANEOUS

## 2020-08-12 MED ORDER — SODIUM CHLORIDE 0.9% FLUSH
10.0000 mL | INTRAVENOUS | Status: DC | PRN
  Administered 2020-08-12: 10 mL
  Filled 2020-08-12: qty 10

## 2020-08-12 NOTE — Patient Instructions (Addendum)
Implanted Port Insertion, Care After This sheet gives you information about how to care for yourself after your procedure. Your health care provider may also give you more specific instructions. If you have problems or questions, contact your health care provider. What can I expect after the procedure? After the procedure, it is common to have:  Discomfort at the port insertion site.  Bruising on the skin over the port. This should improve over 3-4 days. Follow these instructions at home: Port care  After your port is placed, you will get a manufacturer's information card. The card has information about your port. Keep this card with you at all times.  Take care of the port as told by your health care provider. Ask your health care provider if you or a family member can get training for taking care of the port at home. A home health care nurse may also take care of the port.  Make sure to remember what type of port you have. Incision care  Follow instructions from your health care provider about how to take care of your port insertion site. Make sure you: ? Wash your hands with soap and water before and after you change your bandage (dressing). If soap and water are not available, use hand sanitizer. ? Change your dressing as told by your health care provider. ? Leave stitches (sutures), skin glue, or adhesive strips in place. These skin closures may need to stay in place for 2 weeks or longer. If adhesive strip edges start to loosen and curl up, you may trim the loose edges. Do not remove adhesive strips completely unless your health care provider tells you to do that.  Check your port insertion site every day for signs of infection. Check for: ? Redness, swelling, or pain. ? Fluid or blood. ? Warmth. ? Pus or a bad smell.      Activity  Return to your normal activities as told by your health care provider. Ask your health care provider what activities are safe for you.  Do not  lift anything that is heavier than 10 lb (4.5 kg), or the limit that you are told, until your health care provider says that it is safe. General instructions  Take over-the-counter and prescription medicines only as told by your health care provider.  Do not take baths, swim, or use a hot tub until your health care provider approves. Ask your health care provider if you may take showers. You may only be allowed to take sponge baths.  Do not drive for 24 hours if you were given a sedative during your procedure.  Wear a medical alert bracelet in case of an emergency. This will tell any health care providers that you have a port.  Keep all follow-up visits as told by your health care provider. This is important. Contact a health care provider if:  You cannot flush your port with saline as directed, or you cannot draw blood from the port.  You have a fever or chills.  You have redness, swelling, or pain around your port insertion site.  You have fluid or blood coming from your port insertion site.  Your port insertion site feels warm to the touch.  You have pus or a bad smell coming from the port insertion site. Get help right away if:  You have chest pain or shortness of breath.  You have bleeding from your port that you cannot control. Summary  Take care of the port as told by your   health care provider. Keep the manufacturer's information card with you at all times.  Change your dressing as told by your health care provider.  Contact a health care provider if you have a fever or chills or if you have redness, swelling, or pain around your port insertion site.  Keep all follow-up visits as told by your health care provider. This information is not intended to replace advice given to you by your health care provider. Make sure you discuss any questions you have with your health care provider. Document Revised: 01/13/2018 Document Reviewed: 01/13/2018 Elsevier Patient Education   2021 [employer]    Pegfilgrastim injection What is this medicine? PEGFILGRASTIM (PEG fil gra stim) is a long-acting granulocyte colony-stimulating factor that stimulates the growth of neutrophils, a type of white blood cell important in the body's fight against infection. It is used to reduce the incidence of fever and infection in patients with certain types of cancer who are receiving chemotherapy that affects the bone marrow, and to increase survival after being exposed to high doses of radiation. This medicine may be used for other purposes; ask your health care provider or pharmacist if you have questions. COMMON BRAND NAME(S): Fulphila, Neulasta, Nyvepria, [name], Ziextenzo What should I tell my health care provider before I take this medicine? They need to know if you have any of these conditions:  kidney disease  latex allergy  ongoing radiation therapy  sickle cell disease  skin reactions to acrylic adhesives (On-Body Injector only)  an unusual or allergic reaction to pegfilgrastim, filgrastim, other medicines, foods, dyes, or preservatives  pregnant or trying to get pregnant  breast-feeding How should I use this medicine? This medicine is for injection under the skin. If you get this medicine at home, you will be taught how to prepare and give the pre-filled syringe or how to use the On-body Injector. Refer to the patient Instructions for Use for detailed instructions. Use exactly as directed. Tell your healthcare provider immediately if you suspect that the On-body Injector may not have performed as intended or if you suspect the use of the On-body Injector resulted in a missed or partial dose. It is important that you put your used needles and syringes in a special sharps container. Do not put them in a trash can. If you do not have a sharps container, call your pharmacist or healthcare provider to get one. Talk to your pediatrician regarding the use of this medicine in  children. While this drug may be prescribed for selected conditions, precautions do apply. Overdosage: If you think you have taken too much of this medicine contact a poison control center or emergency room at once. NOTE: This medicine is only for you. Do not share this medicine with others. What if I miss a dose? It is important not to miss your dose. Call your doctor or health care professional if you miss your dose. If you miss a dose due to an On-body Injector failure or leakage, a new dose should be administered as soon as possible using a single prefilled syringe for manual use. What may interact with this medicine? Interactions have not been studied. This list may not describe all possible interactions. Give your health care provider a list of all the medicines, herbs, non-prescription drugs, or dietary supplements you use. Also tell them if you smoke, drink alcohol, or use illegal drugs. Some items may interact with your medicine. What should I watch for while using this medicine? Your condition will be monitored   carefully while you are receiving this medicine. You may need blood work done while you are taking this medicine. Talk to your health care provider about your risk of cancer. You may be more at risk for certain types of cancer if you take this medicine. If you are going to need a MRI, CT scan, or other procedure, tell your doctor that you are using this medicine (On-Body Injector only). What side effects may I notice from receiving this medicine? Side effects that you should report to your doctor or health care professional as soon as possible:  allergic reactions (skin rash, itching or hives, swelling of the face, lips, or tongue)  back pain  dizziness  fever  pain, redness, or irritation at site where injected  pinpoint red spots on the skin  red or dark-brown urine  shortness of breath or breathing problems  stomach or side pain, or pain at the  shoulder  swelling  tiredness  trouble passing urine or change in the amount of urine  unusual bruising or bleeding Side effects that usually do not require medical attention (report to your doctor or health care professional if they continue or are bothersome):  bone pain  muscle pain This list may not describe all possible side effects. Call your doctor for medical advice about side effects. You may report side effects to FDA at 1-800-FDA-1088. Where should I keep my medicine? Keep out of the reach of children. If you are using this medicine at home, you will be instructed on how to store it. Throw away any unused medicine after the expiration date on the label. NOTE: This sheet is a summary. It may not cover all possible information. If you have questions about this medicine, talk to your doctor, pharmacist, or health care provider.  2021 Elsevier/[employer] Standard (2019-07-09 13:20:51)  

## 2020-08-16 ENCOUNTER — Encounter: Payer: Self-pay | Admitting: Hematology

## 2020-08-17 ENCOUNTER — Other Ambulatory Visit: Payer: Federal, State, Local not specified - PPO

## 2020-08-17 ENCOUNTER — Ambulatory Visit: Payer: Federal, State, Local not specified - PPO | Admitting: Nurse Practitioner

## 2020-08-18 ENCOUNTER — Telehealth: Payer: Self-pay | Admitting: Nurse Practitioner

## 2020-08-18 NOTE — Telephone Encounter (Signed)
Attempted to contact patient about upcoming appointment. Left a detailed message about virtual visit. Per provider request.

## 2020-08-23 ENCOUNTER — Encounter: Payer: Self-pay | Admitting: Nurse Practitioner

## 2020-08-23 ENCOUNTER — Other Ambulatory Visit: Payer: Self-pay

## 2020-08-23 ENCOUNTER — Inpatient Hospital Stay: Payer: Federal, State, Local not specified - PPO

## 2020-08-23 ENCOUNTER — Inpatient Hospital Stay (HOSPITAL_BASED_OUTPATIENT_CLINIC_OR_DEPARTMENT_OTHER): Payer: Federal, State, Local not specified - PPO

## 2020-08-23 ENCOUNTER — Telehealth: Payer: Self-pay | Admitting: Hematology

## 2020-08-23 ENCOUNTER — Ambulatory Visit: Payer: Federal, State, Local not specified - PPO | Admitting: Family

## 2020-08-23 ENCOUNTER — Inpatient Hospital Stay: Payer: Federal, State, Local not specified - PPO | Admitting: Nurse Practitioner

## 2020-08-23 VITALS — BP 123/77 | HR 85 | Temp 98.7°F | Resp 16 | Ht 63.0 in | Wt 113.5 lb

## 2020-08-23 VITALS — BP 107/66

## 2020-08-23 DIAGNOSIS — C787 Secondary malignant neoplasm of liver and intrahepatic bile duct: Secondary | ICD-10-CM

## 2020-08-23 DIAGNOSIS — Z5111 Encounter for antineoplastic chemotherapy: Secondary | ICD-10-CM | POA: Diagnosis not present

## 2020-08-23 DIAGNOSIS — C182 Malignant neoplasm of ascending colon: Secondary | ICD-10-CM | POA: Diagnosis not present

## 2020-08-23 DIAGNOSIS — C20 Malignant neoplasm of rectum: Secondary | ICD-10-CM

## 2020-08-23 DIAGNOSIS — Z95828 Presence of other vascular implants and grafts: Secondary | ICD-10-CM

## 2020-08-23 LAB — CEA (IN HOUSE-CHCC): CEA (CHCC-In House): 8.85 ng/mL — ABNORMAL HIGH (ref 0.00–5.00)

## 2020-08-23 LAB — CMP (CANCER CENTER ONLY)
ALT: 7 U/L (ref 0–44)
AST: 14 U/L — ABNORMAL LOW (ref 15–41)
Albumin: 3.6 g/dL (ref 3.5–5.0)
Alkaline Phosphatase: 71 U/L (ref 38–126)
Anion gap: 7 (ref 5–15)
BUN: 8 mg/dL (ref 8–23)
CO2: 25 mmol/L (ref 22–32)
Calcium: 8.8 mg/dL — ABNORMAL LOW (ref 8.9–10.3)
Chloride: 103 mmol/L (ref 98–111)
Creatinine: 0.79 mg/dL (ref 0.44–1.00)
GFR, Estimated: >60 mL/min (ref >60)
Glucose, Bld: 100 mg/dL — ABNORMAL HIGH (ref 70–99)
Potassium: 3.6 mmol/L (ref 3.5–5.1)
Sodium: 135 mmol/L (ref 135–145)
Total Bilirubin: 0.3 mg/dL (ref 0.3–1.2)
Total Protein: 6.6 g/dL (ref 6.5–8.1)

## 2020-08-23 LAB — CBC WITH DIFFERENTIAL (CANCER CENTER ONLY)
Abs Immature Granulocytes: 0.02 10*3/uL (ref 0.00–0.07)
Basophils Absolute: 0 10*3/uL (ref 0.0–0.1)
Basophils Relative: 0 %
Eosinophils Absolute: 0.2 10*3/uL (ref 0.0–0.5)
Eosinophils Relative: 4 %
HCT: 31.2 % — ABNORMAL LOW (ref 36.0–46.0)
Hemoglobin: 9.9 g/dL — ABNORMAL LOW (ref 12.0–15.0)
Immature Granulocytes: 0 %
Lymphocytes Relative: 15 %
Lymphs Abs: 0.7 10*3/uL (ref 0.7–4.0)
MCH: 27 pg (ref 26.0–34.0)
MCHC: 31.7 g/dL (ref 30.0–36.0)
MCV: 85 fL (ref 80.0–100.0)
Monocytes Absolute: 0.3 10*3/uL (ref 0.1–1.0)
Monocytes Relative: 7 %
Neutro Abs: 3.6 10*3/uL (ref 1.7–7.7)
Neutrophils Relative %: 74 %
Platelet Count: 157 10*3/uL (ref 150–400)
RBC: 3.67 MIL/uL — ABNORMAL LOW (ref 3.87–5.11)
RDW: 13.7 % (ref 11.5–15.5)
WBC Count: 4.8 10*3/uL (ref 4.0–10.5)
nRBC: 0 % (ref 0.0–0.2)

## 2020-08-23 LAB — TOTAL PROTEIN, URINE DIPSTICK: Protein, ur: NEGATIVE mg/dL

## 2020-08-23 MED ORDER — SODIUM CHLORIDE 0.9 % IV SOLN
150.0000 mg/m2 | Freq: Once | INTRAVENOUS | Status: AC
  Administered 2020-08-23: 240 mg via INTRAVENOUS
  Filled 2020-08-23: qty 12

## 2020-08-23 MED ORDER — SODIUM CHLORIDE 0.9 % IV SOLN
2200.0000 mg/m2 | INTRAVENOUS | Status: DC
  Administered 2020-08-23: 3400 mg via INTRAVENOUS
  Filled 2020-08-23: qty 68

## 2020-08-23 MED ORDER — SODIUM CHLORIDE 0.9% FLUSH
10.0000 mL | Freq: Once | INTRAVENOUS | Status: AC
  Administered 2020-08-23: 10 mL
  Filled 2020-08-23: qty 10

## 2020-08-23 MED ORDER — SODIUM CHLORIDE 0.9 % IV SOLN
400.0000 mg/m2 | Freq: Once | INTRAVENOUS | Status: AC
  Administered 2020-08-23: 620 mg via INTRAVENOUS
  Filled 2020-08-23: qty 31

## 2020-08-23 MED ORDER — PALONOSETRON HCL INJECTION 0.25 MG/5ML
INTRAVENOUS | Status: AC
  Filled 2020-08-23: qty 5

## 2020-08-23 MED ORDER — PALONOSETRON HCL INJECTION 0.25 MG/5ML
0.2500 mg | Freq: Once | INTRAVENOUS | Status: AC
  Administered 2020-08-23: 0.25 mg via INTRAVENOUS

## 2020-08-23 MED ORDER — ATROPINE SULFATE 1 MG/ML IJ SOLN
INTRAMUSCULAR | Status: AC
  Filled 2020-08-23: qty 1

## 2020-08-23 MED ORDER — SODIUM CHLORIDE 0.9 % IV SOLN
Freq: Once | INTRAVENOUS | Status: AC
  Filled 2020-08-23: qty 250

## 2020-08-23 MED ORDER — SODIUM CHLORIDE 0.9 % IV SOLN
5.0000 mg/kg | Freq: Once | INTRAVENOUS | Status: AC
  Administered 2020-08-23: 275 mg via INTRAVENOUS
  Filled 2020-08-23: qty 11

## 2020-08-23 MED ORDER — SODIUM CHLORIDE 0.9 % IV SOLN
10.0000 mg | Freq: Once | INTRAVENOUS | Status: AC
  Administered 2020-08-23: 10 mg via INTRAVENOUS
  Filled 2020-08-23: qty 10

## 2020-08-23 MED ORDER — ATROPINE SULFATE 1 MG/ML IJ SOLN
0.5000 mg | Freq: Once | INTRAMUSCULAR | Status: AC | PRN
  Administered 2020-08-23: 0.5 mg via INTRAVENOUS

## 2020-08-23 NOTE — Progress Notes (Signed)
irineotecan and leucovorin given to pt first and bevacizumab to follow per [facility] in pharmacy.

## 2020-08-23 NOTE — Telephone Encounter (Signed)
Scheduled follow-up appointments per 2/23 los. Patient is aware. 

## 2020-08-23 NOTE — Patient Instructions (Signed)
[place] Discharge Instructions for Patients Receiving Chemotherapy  Today you received the following chemotherapy agents: Irinotecan, Leucovorin, and Fluorouracil  To help prevent nausea and vomiting after your treatment, we encourage you to take your nausea medication as directed by your MD.   If you develop nausea and vomiting that is not controlled by your nausea medication, call the clinic.   BELOW ARE SYMPTOMS THAT SHOULD BE REPORTED IMMEDIATELY:  *FEVER GREATER THAN 100.5 F  *CHILLS WITH OR WITHOUT FEVER  NAUSEA AND VOMITING THAT IS NOT CONTROLLED WITH YOUR NAUSEA MEDICATION  *UNUSUAL SHORTNESS OF BREATH  *UNUSUAL BRUISING OR BLEEDING  TENDERNESS IN MOUTH AND THROAT WITH OR WITHOUT PRESENCE OF ULCERS  *URINARY PROBLEMS  *BOWEL PROBLEMS  UNUSUAL RASH Items with * indicate a potential emergency and should be followed up as soon as possible.  Feel free to call the clinic should you have any questions or concerns. The clinic phone number is [phone].  Please show the [place] at check-in to the Emergency Department and triage nurse.  Irinotecan injection What is this medicine? IRINOTECAN (ir in oh TEE kan ) is a chemotherapy drug. It is used to treat colon and rectal cancer. This medicine may be used for other purposes; ask your health care provider or pharmacist if you have questions. COMMON BRAND NAME(S): Camptosar What should I tell my health care provider before I take this medicine? They need to know if you have any of these conditions:  dehydration  diarrhea  infection (especially a virus infection such as chickenpox, cold sores, or herpes)  liver disease  low blood counts, like low white cell, platelet, or red cell counts  low levels of calcium, magnesium, or potassium in the blood  recent or ongoing radiation therapy  an unusual or allergic reaction to irinotecan, other medicines, foods, dyes, or  preservatives  pregnant or trying to get pregnant  breast-feeding How should I use this medicine? This drug is given as an infusion into a vein. It is administered in a hospital or clinic by a specially trained health care professional. Talk to your pediatrician regarding the use of this medicine in children. Special care may be needed. Overdosage: If you think you have taken too much of this medicine contact a poison control center or emergency room at once. NOTE: This medicine is only for you. Do not share this medicine with others. What if I miss a dose? It is important not to miss your dose. Call your doctor or health care professional if you are unable to keep an appointment. What may interact with this medicine? Do not take this medicine with any of the following medications:  cobicistat  itraconazole This medicine may interact with the following medications:  antiviral medicines for HIV or AIDS  certain antibiotics like rifampin or rifabutin  certain medicines for fungal infections like ketoconazole, posaconazole, and voriconazole  certain medicines for seizures like carbamazepine, phenobarbital, phenotoin  clarithromycin  gemfibrozil  nefazodone  St. John's Wort This list may not describe all possible interactions. Give your health care provider a list of all the medicines, herbs, non-prescription drugs, or dietary supplements you use. Also tell them if you smoke, drink alcohol, or use illegal drugs. Some items may interact with your medicine. What should I watch for while using this medicine? Your condition will be monitored carefully while you are receiving this medicine. You will need important blood work done while you are taking this medicine. This drug may make  you feel generally unwell. This is not uncommon, as chemotherapy can affect healthy cells as well as cancer cells. Report any side effects. Continue your course of treatment even though you feel ill unless  your doctor tells you to stop. In some cases, you may be given additional medicines to help with side effects. Follow all directions for their use. You may get drowsy or dizzy. Do not drive, use machinery, or do anything that needs mental alertness until you know how this medicine affects you. Do not stand or sit up quickly, especially if you are an older patient. This reduces the risk of dizzy or fainting spells. Call your health care professional for advice if you get a fever, chills, or sore throat, or other symptoms of a cold or flu. Do not treat yourself. This medicine decreases your body's ability to fight infections. Try to avoid being around people who are sick. Avoid taking products that contain aspirin, acetaminophen, ibuprofen, naproxen, or ketoprofen unless instructed by your doctor. These medicines may hide a fever. This medicine may increase your risk to bruise or bleed. Call your doctor or health care professional if you notice any unusual bleeding. Be careful brushing and flossing your teeth or using a toothpick because you may get an infection or bleed more easily. If you have any dental work done, tell your dentist you are receiving this medicine. Do not become pregnant while taking this medicine or for 6 months after stopping it. Women should inform their health care professional if they wish to become pregnant or think they might be pregnant. Men should not father a child while taking this medicine and for 3 months after stopping it. There is potential for serious side effects to an unborn child. Talk to your health care professional for more information. Do not breast-feed an infant while taking this medicine or for 7 days after stopping it. This medicine has caused ovarian failure in some women. This medicine may make it more difficult to get pregnant. Talk to your health care professional if you are concerned about your fertility. This medicine has caused decreased sperm counts in  some men. This may make it more difficult to father a child. Talk to your health care professional if you are concerned about your fertility. What side effects may I notice from receiving this medicine? Side effects that you should report to your doctor or health care professional as soon as possible:  allergic reactions like skin rash, itching or hives, swelling of the face, lips, or tongue  chest pain  diarrhea  flushing, runny nose, sweating during infusion  low blood counts - this medicine may decrease the number of white blood cells, red blood cells and platelets. You may be at increased risk for infections and bleeding.  nausea, vomiting  pain, swelling, warmth in the leg  signs of decreased platelets or bleeding - bruising, pinpoint red spots on the skin, black, tarry stools, blood in the urine  signs of infection - fever or chills, cough, sore throat, pain or difficulty passing urine  signs of decreased red blood cells - unusually weak or tired, fainting spells, lightheadedness Side effects that usually do not require medical attention (report to your doctor or health care professional if they continue or are bothersome):  constipation  hair loss  headache  loss of appetite  mouth sores  stomach pain This list may not describe all possible side effects. Call your doctor for medical advice about side effects. You may report side  effects to FDA at 1-800-FDA-1088. Where should I keep my medicine? This drug is given in a hospital or clinic and will not be stored at home. NOTE: This sheet is a summary. It may not cover all possible information. If you have questions about this medicine, talk to your doctor, pharmacist, or health care provider.  2021 Elsevier/Gold Standard (2019-05-18 17:46:13) Leucovorin injection What is this medicine? LEUCOVORIN (loo koe VOR in) is used to prevent or treat the harmful effects of some medicines. This medicine is used to treat anemia  caused by a low amount of folic acid in the body. It is also used with 5-fluorouracil (5-FU) to treat colon cancer. This medicine may be used for other purposes; ask your health care provider or pharmacist if you have questions. What should I tell my health care provider before I take this medicine? They need to know if you have any of these conditions:  anemia from low levels of vitamin B-12 in the blood  an unusual or allergic reaction to leucovorin, folic acid, other medicines, foods, dyes, or preservatives  pregnant or trying to get pregnant  breast-feeding How should I use this medicine? This medicine is for injection into a muscle or into a vein. It is given by a health care professional in a hospital or clinic setting. Talk to your pediatrician regarding the use of this medicine in children. Special care may be needed. Overdosage: If you think you have taken too much of this medicine contact a poison control center or emergency room at once. NOTE: This medicine is only for you. Do not share this medicine with others. What if I miss a dose? This does not apply. What may interact with this medicine?  capecitabine  fluorouracil  phenobarbital  phenytoin  primidone  trimethoprim-sulfamethoxazole This list may not describe all possible interactions. Give your health care provider a list of all the medicines, herbs, non-prescription drugs, or dietary supplements you use. Also tell them if you smoke, drink alcohol, or use illegal drugs. Some items may interact with your medicine. What should I watch for while using this medicine? Your condition will be monitored carefully while you are receiving this medicine. This medicine may increase the side effects of 5-fluorouracil, 5-FU. Tell your doctor or health care professional if you have diarrhea or mouth sores that do not get better or that get worse. What side effects may I notice from receiving this medicine? Side effects that you  should report to your doctor or health care professional as soon as possible:  allergic reactions like skin rash, itching or hives, swelling of the face, lips, or tongue  breathing problems  fever, infection  mouth sores  unusual bleeding or bruising  unusually weak or tired Side effects that usually do not require medical attention (report to your doctor or health care professional if they continue or are bothersome):  constipation or diarrhea  loss of appetite  nausea, vomiting This list may not describe all possible side effects. Call your doctor for medical advice about side effects. You may report side effects to FDA at 1-800-FDA-1088. Where should I keep my medicine? This drug is given in a hospital or clinic and will not be stored at home. NOTE: This sheet is a summary. It may not cover all possible information. If you have questions about this medicine, talk to your doctor, pharmacist, or health care provider.  2021 Elsevier/Gold Standard (2007-12-22 16:50:29)  Fluorouracil, 5-FU injection What is this medicine? FLUOROURACIL, 5-FU (flure oh  YOOR a sil) is a chemotherapy drug. It slows the growth of cancer cells. This medicine is used to treat many types of cancer like breast cancer, colon or rectal cancer, pancreatic cancer, and stomach cancer. This medicine may be used for other purposes; ask your health care provider or pharmacist if you have questions. COMMON BRAND NAME(S): Adrucil What should I tell my health care provider before I take this medicine? They need to know if you have any of these conditions:  blood disorders  dihydropyrimidine dehydrogenase (DPD) deficiency  infection (especially a virus infection such as chickenpox, cold sores, or herpes)  kidney disease  liver disease  malnourished, poor nutrition  recent or ongoing radiation therapy  an unusual or allergic reaction to fluorouracil, other chemotherapy, other medicines, foods, dyes, or  preservatives  pregnant or trying to get pregnant  breast-feeding How should I use this medicine? This drug is given as an infusion or injection into a vein. It is administered in a hospital or clinic by a specially trained health care professional. Talk to your pediatrician regarding the use of this medicine in children. Special care may be needed. Overdosage: If you think you have taken too much of this medicine contact a poison control center or emergency room at once. NOTE: This medicine is only for you. Do not share this medicine with others. What if I miss a dose? It is important not to miss your dose. Call your doctor or health care professional if you are unable to keep an appointment. What may interact with this medicine? Do not take this medicine with any of the following medications:  live virus vaccines This medicine may also interact with the following medications:  medicines that treat or prevent blood clots like warfarin, enoxaparin, and dalteparin This list may not describe all possible interactions. Give your health care provider a list of all the medicines, herbs, non-prescription drugs, or dietary supplements you use. Also tell them if you smoke, drink alcohol, or use illegal drugs. Some items may interact with your medicine. What should I watch for while using this medicine? Visit your doctor for checks on your progress. This drug may make you feel generally unwell. This is not uncommon, as chemotherapy can affect healthy cells as well as cancer cells. Report any side effects. Continue your course of treatment even though you feel ill unless your doctor tells you to stop. In some cases, you may be given additional medicines to help with side effects. Follow all directions for their use. Call your doctor or health care professional for advice if you get a fever, chills or sore throat, or other symptoms of a cold or flu. Do not treat yourself. This drug decreases your body's  ability to fight infections. Try to avoid being around people who are sick. This medicine may increase your risk to bruise or bleed. Call your doctor or health care professional if you notice any unusual bleeding. Be careful brushing and flossing your teeth or using a toothpick because you may get an infection or bleed more easily. If you have any dental work done, tell your dentist you are receiving this medicine. Avoid taking products that contain aspirin, acetaminophen, ibuprofen, naproxen, or ketoprofen unless instructed by your doctor. These medicines may hide a fever. Do not become pregnant while taking this medicine. Women should inform their doctor if they wish to become pregnant or think they might be pregnant. There is a potential for serious side effects to an unborn child. Talk to  your health care professional or pharmacist for more information. Do not breast-feed an infant while taking this medicine. Men should inform their doctor if they wish to father a child. This medicine may lower sperm counts. Do not treat diarrhea with over the counter products. Contact your doctor if you have diarrhea that lasts more than 2 days or if it is severe and watery. This medicine can make you more sensitive to the sun. Keep out of the sun. If you cannot avoid being in the sun, wear protective clothing and use sunscreen. Do not use sun lamps or tanning beds/booths. What side effects may I notice from receiving this medicine? Side effects that you should report to your doctor or health care professional as soon as possible:  allergic reactions like skin rash, itching or hives, swelling of the face, lips, or tongue  low blood counts - this medicine may decrease the number of white blood cells, red blood cells and platelets. You may be at increased risk for infections and bleeding.  signs of infection - fever or chills, cough, sore throat, pain or difficulty passing urine  signs of decreased platelets or  bleeding - bruising, pinpoint red spots on the skin, black, tarry stools, blood in the urine  signs of decreased red blood cells - unusually weak or tired, fainting spells, lightheadedness  breathing problems  changes in vision  chest pain  mouth sores  nausea and vomiting  pain, swelling, redness at site where injected  pain, tingling, numbness in the hands or feet  redness, swelling, or sores on hands or feet  stomach pain  unusual bleeding Side effects that usually do not require medical attention (report to your doctor or health care professional if they continue or are bothersome):  changes in finger or toe nails  diarrhea  dry or itchy skin  hair loss  headache  loss of appetite  sensitivity of eyes to the light  stomach upset  unusually teary eyes This list may not describe all possible side effects. Call your doctor for medical advice about side effects. You may report side effects to FDA at 1-800-FDA-1088. Where should I keep my medicine? This drug is given in a hospital or clinic and will not be stored at home. NOTE: This sheet is a summary. It may not cover all possible information. If you have questions about this medicine, talk to your doctor, pharmacist, or health care provider.  2021 Elsevier/Gold Standard (2019-05-18 15:00:03)  [facility] Discharge Instructions for Patients receiving Home Portable Chemo Pump    **The bag should finish at 46 hours, 96 hours or 7 days. For example, if your pump is scheduled for 46 hours and it was put on at 4pm, it should finish at 2 pm the day it is scheduled to come off regardless of your appointment time.    Estimated time to finish   _________________________ (Have your nurse fill in)     ** if the display on your pump reads "Low Volume" and it is beeping, take the batteries out of the pump and come to the cancer center for it to be taken off.   **If the pump alarms go off prior to the pump  reading "Low Volume" then call the [phone] and someone can assist you.  **If the plunger comes out and the bag fluid is running out, please use your chemo spill kit to clean up the spill. Do not use paper towels or other house hold products.  **  If you have problems or questions regarding your pump, please call either the [phone] or the cancer center Monday-Friday 8:00am-4:30pm at [phone] and we will assist you.  If you are unable to get assistance then go to [facility], ask the staff to contact the IV team for assistance.   Bevacizumab injection What is this medicine? BEVACIZUMAB (be va SIZ yoo mab) is a monoclonal antibody. It is used to treat many types of cancer. This medicine may be used for other purposes; ask your health care provider or pharmacist if you have questions. COMMON BRAND NAME(S): Avastin, MVASI, Zirabev What should I tell my health care provider before I take this medicine? They need to know if you have any of these conditions:  diabetes  heart disease  high blood pressure  history of coughing up blood  prior anthracycline chemotherapy (e.g., doxorubicin, daunorubicin, epirubicin)  recent or ongoing radiation therapy  recent or planning to have surgery  stroke  an unusual or allergic reaction to bevacizumab, hamster proteins, mouse proteins, other medicines, foods, dyes, or preservatives  pregnant or trying to get pregnant  breast-feeding How should I use this medicine? This medicine is for infusion into a vein. It is given by a health care professional in a hospital or clinic setting. Talk to your pediatrician regarding the use of this medicine in children. Special care may be needed. Overdosage: If you think you have taken too much of this medicine contact a poison control center or emergency room at once. NOTE: This medicine is only for you. Do not share this medicine with others. What if I miss a dose? It is  important not to miss your dose. Call your doctor or health care professional if you are unable to keep an appointment. What may interact with this medicine? Interactions are not expected. This list may not describe all possible interactions. Give your health care provider a list of all the medicines, herbs, non-prescription drugs, or dietary supplements you use. Also tell them if you smoke, drink alcohol, or use illegal drugs. Some items may interact with your medicine. What should I watch for while using this medicine? Your condition will be monitored carefully while you are receiving this medicine. You will need important blood work and urine testing done while you are taking this medicine. This medicine may increase your risk to bruise or bleed. Call your doctor or health care professional if you notice any unusual bleeding. Before having surgery, talk to your health care provider to make sure it is ok. This drug can increase the risk of poor healing of your surgical site or wound. You will need to stop this drug for 28 days before surgery. After surgery, wait at least 28 days before restarting this drug. Make sure the surgical site or wound is healed enough before restarting this drug. Talk to your health care provider if questions. Do not become pregnant while taking this medicine or for 6 months after stopping it. Women should inform their doctor if they wish to become pregnant or think they might be pregnant. There is a potential for serious side effects to an unborn child. Talk to your health care professional or pharmacist for more information. Do not breast-feed an infant while taking this medicine and for 6 months after the last dose. This medicine has caused ovarian failure in some women. This medicine may interfere with the ability to have a child. You should talk to your doctor or health care professional if you  are concerned about your fertility. What side effects may I notice from  receiving this medicine? Side effects that you should report to your doctor or health care professional as soon as possible:  allergic reactions like skin rash, itching or hives, swelling of the face, lips, or tongue  chest pain or chest tightness  chills  coughing up blood  high fever  seizures  severe constipation  signs and symptoms of bleeding such as bloody or black, tarry stools; red or dark-brown urine; spitting up blood or brown material that looks like coffee grounds; red spots on the skin; unusual bruising or bleeding from the eye, gums, or nose  signs and symptoms of a blood clot such as breathing problems; chest pain; severe, sudden headache; pain, swelling, warmth in the leg  signs and symptoms of a stroke like changes in vision; confusion; trouble speaking or understanding; severe headaches; sudden numbness or weakness of the face, arm or leg; trouble walking; dizziness; loss of balance or coordination  stomach pain  sweating  swelling of legs or ankles  vomiting  weight gain Side effects that usually do not require medical attention (report to your doctor or health care professional if they continue or are bothersome):  back pain  changes in taste  decreased appetite  dry skin  nausea  tiredness This list may not describe all possible side effects. Call your doctor for medical advice about side effects. You may report side effects to FDA at 1-800-FDA-1088. Where should I keep my medicine? This drug is given in a hospital or clinic and will not be stored at home. NOTE: This sheet is a summary. It may not cover all possible information. If you have questions about this medicine, talk to your doctor, pharmacist, or health care provider.  2021 Elsevier/Gold Standard (2019-04-14 10:50:46)

## 2020-08-23 NOTE — Progress Notes (Signed)
Brittney Spencer   [phone] [phone]   Clinic Follow up Note   Patient Care Team: Patient, No Pcp Per as PCP - General (General Practice) [name], NP as Nurse Practitioner (Oncology) [name], MD as Consulting Physician (Oncology) [name], RN as Oncology Nurse Navigator 08/23/2020  CHIEF COMPLAINT: Follow up stage IIIC right colon cancerand newly diagnosed rectal cancer  SUMMARY OF ONCOLOGIC HISTORY: Oncology History Overview Note  Cancer Staging Cancer of ascending colon [facility]) Staging form: Colon and Rectum, AJCC 7th Edition - Clinical stage from 02/07/2016: Stage IIIC (T4b, N1b, M0) - Signed by [name], MD on 03/04/2016    Cancer of ascending colon ([place])  10/25/2015 Imaging   CT ABD/PELVIS:  Inflammatory changes inferior to the cecal tip appear improved, there is still irregular soft tissue thickening of the cecal tip, and there are adjacent prominent lymph nodes in the ileocolonic mesentery, measuring 13 mm on image 49 and 8 mm on image 52. In addition, there is a 2.5 x 1.8 cm nodule on image 46 which has central low density. Therefore, these findings are moderately suspicious for an underlying cecal malignancy with perforation.    01/19/2016 Procedure   COLONOSCOPY per Dr. [name]: Fungating, ulcerated mass almost obstructing mid ascending colon   01/19/2016 Initial Biopsy   Diagnosis Surgical [P], cecal mass - INVASIVE ADENOCARCINOMA WITH ULCERATION. - SEE COMMENT.   02/05/2016 Tumor Marker   Patient's tumor was tested for the following markers: CEA Results of the tumor marker test revealed 5.7.   02/07/2016 Initial Diagnosis   Cancer of ascending colon ([place])   02/07/2016 Definitive Surgery   Laparoscopic assisted right hemicolectomy and right salpingo oopherectomy--Dr. [name]   02/07/2016 Pathologic Stage   p T4 N1b   2/43 nodes +   02/07/2016 Pathology Results   MMR normal; G2 adenocarcinoma;proximal & distal  margins negative; soft tissue mass on pelvic sidewall + for adenocarcinoma with positive margin MSI Stable   03/08/2016 Imaging   CT chest negative for metastasis.    03/19/2016 - 04/25/2016 Radiation Therapy   Adjuvant irradiation, 50 gray in 28 fractions   03/19/2016 - 04/22/2016 Chemotherapy   Xeloda 1500 mg twice daily, started on 03/19/2016, dose reduced to 1000 mg twice daily from week 3 due to neutropenia, and patient stopped 3 days before last dose radiation due to difficulty swallowing the pill    05/20/2016 -  Adjuvant Chemotherapy   Patient declined adjuvant chemotherapy   09/16/2016 Imaging   CT CAP w Contrast 1. No evidence of local tumor recurrence at the ileocolic anastomosis. 2. No findings suspicious for metastatic disease in the chest, abdomen or pelvis. 3. Nonspecific trace free fluid in the pelvic cul-de-sac. 4. Stable solitary 3 mm right upper lobe pulmonary nodule, for which 6 month stability has been demonstrated, probably benign. 5. Additional findings include stable right posterior pericardial cyst and small calcified uterine fibroids.   05/13/2017 Imaging   CT CAP W Contrast 05/13/17 IMPRESSION: 1. No current findings of residual or recurrent malignancy. 2. Mild prominence of stool throughout the colon. Nondistended portions of the rectum. 3. Several tiny pulmonary nodules are stable from the earliest available comparison of 03/08/2016 and probably benign, but may merit surveillance. 4. Other imaging findings of potential clinical significance: Old granulomatous disease. Aortoiliac atherosclerotic vascular disease. Lumbar spondylosis and degenerative disc disease. Stable amount of trace free pelvic fluid.   04/27/2018 Imaging   04/27/2018 CT CAP IMPRESSION: Stable exam. No evidence of recurrent  or metastatic carcinoma within the chest, abdomen, or pelvis   04/19/2019 Imaging   CT CAP W Contrast  IMPRESSION: Chest Impression:   1. No evidence  of thoracic metastasis. 2. Stable small bilateral pulmonary nodules.   Abdomen / Pelvis Impression:   1. No evidence local colorectal carcinoma recurrence or metastasis in the abdomen pelvis. 2. Post RIGHT hemicolectomy.   Rectal cancer metastasized to liver ([place])  06/15/2020 Procedure   Screening Colonoscopy by Dr [name]  IMPRESSION - Decreased sphincter tone and internal hemorrhoids that prolapse with straining, but require manual replacement into the anal canal (Grade III) found on digital rectal exam. - Patent side-to-side ileo-colonic anastomosis, characterized by healthy appearing mucosa. - The examined portion of the ileum was normal. - One diminutive polyp in the proximal transverse colon, removed with a cold biopsy forceps. Resected and retrieved. - Likely malignant partially obstructing tumor in the mid rectum. Biopsied. Tattooed. - The examination was otherwise normal on direct and retroflexion views.   06/15/2020 Initial Biopsy   Diagnosis 1. Transverse Colon Polyp - HYPERPLASTIC POLYP 2. Rectum, biopsy - ADENOCARCINOMA ARISING IN A TUBULAR ADENOMA WITH HIGH-GRADE DYSPLASIA. SEE NOTE Diagnosis Note 2. Dr. [name] reviewed the case and concurs with the diagnosis. Dr. [name] was notified on 06/16/2020.   06/28/2020 Imaging   CT CAP  IMPRESSION: 1. New low-density focus in the anterior aspect of the lateral segment LEFT hepatic lobe measuring 1.2 x 1.0 cm, compatible with small metastatic lesion in the LEFT hepatic lobe. 2. Soft tissue in the RIGHT iliac fossa following RIGHT hemicolectomy invades the psoas musculature and is slowly enlarging over time, more linear on the prior study now highly concerning for recurrence/metastasis to this location. 3. Signs of enteritis, potentially post radiation changes of the small bowel. Tethered small bowel in the RIGHT lower quadrant shows focal thickening and narrowing suspicious for small bowel involvement and developing  partial obstruction though currently contrast passes beyond this point into the colon. 4. Rectal thickening in this patient with known rectal mass as described. 5. No evidence of metastatic disease in the chest. 6. Stable small pulmonary nodules. 7.  and aortic atherosclerosis.   Aortic Atherosclerosis (ICD10-I70.0) and Emphysema (ICD10-J43.9).   07/04/2020 Initial Diagnosis   Rectal cancer metastasized to liver ([place])   07/12/2020 PET scan   IMPRESSION: 1. Exam positive for FDG avid rectal tumor which corresponds to the recent colonoscopy findings. 2. FDG avid soft tissue mass within the right iliac fossa is noted and consistent with local tumor recurrence from previous ascending colon tumor. 3. Lateral segment left lobe of liver lesion is FDG avid concerning for liver metastasis. 4. No specific findings identified to suggest metastatic disease to the chest.   08/10/2020 -  Chemotherapy   First-line FOLFIRI q2weeks starting 08/10/20      CURRENT THERAPY: First-line FOLFIRI q2weeks starting 08/10/20  INTERVAL HISTORY: Brittney Spencer returns for follow-up and treatment as scheduled.  She completed cycle 1 dose reduced FOLFIRI on 08/10/2020.  She expected to feel ill but had no side effects.  No nausea/vomiting.  She had no diarrhea until last night x1.  Per recommendations from dietitian she essentially changed her whole diet, stopped eating fruits and vegetables and ate more starches and some spicy food.  She thinks this contributed to her softer stools and one episode of diarrhea.  She did not try any Lomotil/Imodium.  Denies rectal pain or bleeding.  She was able to remain active, drive, and complete all activities.  Denies any  fever, chills, cough, chest pain, dyspnea, neuropathy, or other concerns.   MEDICAL HISTORY:  Past Medical History:  Diagnosis Date  . AAA (abdominal aortic aneurysm) ([facility])    infrarenal 4.1 cmper s-9-19 scan on chart  . Anemia    hx of  . Anxiety    has PRN  meds  . Colon cancer ([place]) 2017   RIGHT hemi colectomy-s/p sx  . GERD (gastroesophageal reflux disease)    OTC meds/diet control  . Hypertension    on meds  . Retinal detachment, right 09/2019  . Vitamin D deficiency     SURGICAL HISTORY: Past Surgical History:  Procedure Laterality Date  . COLONOSCOPY  2018   HD-hams  . COLONSCOPY  12/2015  . IR IMAGING GUIDED PORT INSERTION  08/04/2020  . LAPAROSCOPIC RIGHT HEMI COLECTOMY Right 02/07/2016   Procedure: LAPAROSCOPIC ASSISTED RIGHT HEMI COLECTOMY AND RIGHT SALPINGO OOPHERECTOMY;  Surgeon: [name], MD;  Location: [facility] ORS;  Service: General;  Laterality: Right;  . RETINAL DETACHMENT SURGERY  09/2019    I have reviewed the social history and family history with the patient and they are unchanged from previous note.  ALLERGIES:  is allergic to fish allergy, peanut-containing drug products, soy allergy, and buspirone.  MEDICATIONS:  Current Outpatient Medications  Medication Sig Dispense Refill  . ALPRAZolam (XANAX) 0.25 MG tablet Take 1 tablet (0.25 mg total) by mouth daily as needed for anxiety. 30 tablet 0  . Cholecalciferol (VITAMIN D3 PO) Take by mouth daily.    Marland Kitchen docusate sodium (COLACE) 100 MG capsule 1 capsule as needed    . lidocaine-prilocaine (EMLA) cream Apply 1 application topically as needed. 30 g 1  . NORVASC 2.5 MG tablet Take 1 tablet (2.5 mg total) by mouth daily. 30 tablet 3  . ondansetron (ZOFRAN) 8 MG tablet Take 1 tablet (8 mg total) by mouth every 8 (eight) hours as needed for nausea or vomiting. 20 tablet 2  . potassium chloride (KLOR-CON) 10 MEQ tablet Take 1 tablet (10 mEq total) by mouth daily. 10 tablet 0  . prochlorperazine (COMPAZINE) 10 MG tablet Take 1 tablet (10 mg total) by mouth every 6 (six) hours as needed for nausea or vomiting. 30 tablet 2   Current Facility-Administered Medications  Medication Dose Route Frequency Provider Last Rate Last Admin  . 0.9 %  sodium chloride infusion  500 mL  Intravenous Once Doran Stabler, MD       Facility-Administered Medications Ordered in Other Visits  Medication Dose Route Frequency Provider Last [place]  . bevacizumab-bvzr (ZIRABEV) 275 mg in sodium chloride 0.9 % 100 mL chemo infusion  5 mg/kg (Treatment Plan Recorded) Intravenous Once [name], MD      . fluorouracil (ADRUCIL) 3,400 mg in sodium chloride 0.9 % 82 mL chemo infusion  2,200 mg/m2 (Treatment Plan Recorded) Intravenous 1 day or 1 dose [name], MD      . irinotecan (CAMPTOSAR) 240 mg in sodium chloride 0.9 % 500 mL chemo infusion  150 mg/m2 (Treatment Plan Recorded) Intravenous Once [name], MD 341 mL/hr at 08/23/20 1036 240 mg at 08/23/20 1036  . leucovorin 620 mg in sodium chloride 0.9 % 250 mL infusion  400 mg/m2 (Treatment Plan Recorded) Intravenous Once [name], MD 187 mL/hr at 08/23/20 1039 620 mg at 08/23/20 1039    PHYSICAL EXAMINATION: ECOG PERFORMANCE STATUS: 1 - Symptomatic but completely ambulatory  Vitals:   08/23/20 0816  BP: 123/77  Pulse: 85  Resp: 16  Temp: 98.7 F (37.1 C)  SpO2: 97%   Filed Weights   08/23/20 0816  Weight: 113 lb 8 oz (51.5 kg)    GENERAL:alert, no distress and comfortable SKIN: No rash EYES:  sclera clear OROPHARYNX: No thrush or ulcers LUNGS:  normal breathing effort HEART:  no lower extremity edema NEURO: alert & oriented x 3 with fluent speech, no focal motor/sensory deficits PAC without erythema  LABORATORY DATA:  I have reviewed the data as listed CBC Latest Ref Rng & Units 08/23/2020 08/10/2020 07/28/2020  WBC 4.0 - 10.5 K/uL 4.8 3.3(L) 2.7(L)  Hemoglobin 12.0 - 15.0 g/dL 9.9(L) 10.2(L) 10.8(L)  Hematocrit 36.0 - 46.0 % 31.2(L) 31.8(L) 35.1(L)  Platelets 150 - 400 K/uL 157 160 202     CMP Latest Ref Rng & Units 08/23/2020 08/10/2020 07/28/2020  Glucose 70 - 99 mg/dL 100(H) 94 84  BUN 8 - 23 mg/dL '8 13 17  ' Creatinine 0.44 - 1.00 mg/dL 0.79 0.77 0.89  Sodium 135 - 145 mmol/L 135 136 139  Potassium  3.5 - 5.1 mmol/L 3.6 3.7 3.3(L)  Chloride 98 - 111 mmol/L 103 102 104  CO2 22 - 32 mmol/L '25 27 27  ' Calcium 8.9 - 10.3 mg/dL 8.8(L) 9.5 9.1  Total Protein 6.5 - 8.1 g/dL 6.6 7.1 7.5  Total Bilirubin 0.3 - 1.2 mg/dL 0.3 0.5 0.5  Alkaline Phos 38 - 126 U/L 71 74 71  AST 15 - 41 U/L 14(L) 20 17  ALT 0 - 44 U/L '7 8 8      ' RADIOGRAPHIC STUDIES: I have personally reviewed the radiological images as listed and agreed with the findings in the report. No results found.   ASSESSMENT & PLAN: Brittney Spencer is a 70 y.o. female with   1. Rectal adenocarcinoma, withliverand right pelvic metastasis, recurrence from previous colon cancer vs new primary -Diagnosed on routine screening colonoscopy 05/2020, rectal mass biopsy showed invasive adenocarcinoma, arising from a tubular adenoma -Her 1/12/22PETshowedpositive uptake of known rectal tumor and soft tissue mass within the right iliac fossa indicating recurrent prior colon cancer. There is alsoleft lobe of liver lesion is FDG avid concerning for liver metastasis.pt declined liver biopsy  -whether this is metastatic rectal or recurrent/metastatic colon cancer, this is likely not curable, but still treatable. She previously declined referral for HIPEC -her FO is still pending, will see if she is eligible for EGFR inhibitor -She began first-line systemic chemo with dose-reduced FOLFIRI on 08/06/20.  -Bevacizumab added with cycle 2  2. Cancer of ascending colon, pT4bN1bM0, stage IIIC, MSI-stable, (+) surgical margins at pelvic wall -She was diagnosed in 12/2015. She is s/p right hemicolectomy with rightsalpingo oophorectomyand adjuvant ChemoRT.  -she declined adjuvant chemo due to the concern of side effects and impact on her quality of life. -Her 04/2019 CT scan was NED -With recent rectal cancer diagnosis, herPET from 07/12/20 indicates soft tissue mass within the right iliac fossa is noted and consistent with local tumor  recurrence from previous ascendingcolon tumor.  -She declined option of liver biopsy.   3. Mild Anemia  -Has been stable and mild for the past year. -worse lately due to her newly diagnosed rectal cancer and starting chemo -denies bleeding -monitoring  4. HTN, Anxiety -She'll follow-up with her primary care physician and continue medication -She uses Xanax as needed. -stable, not worse since her recent rectal cancer diagnosis  5. Hypokalemia  -K at 3.3 on 07/28/20, on oral K  Disposition : Brittney Spencer appears stable.  She  completed cycle 1 dose reduced FOLFIRI.  She tolerated well without significant toxicities.  We reviewed symptom management for change in bowel habits, nutrition/hydration, adding supplements, and supportive meds.  I prescribed Lomotil.  Labs reviewed, adequate for treatment. CEA improved slightly after 1 cycle of chemo. [facility] One is pending.  We reviewed the rationale and goal of adding bevacizumab.  Potential risk/benefit and side effects including but not limited to hypertension, proteinuria, bleeding, GI perforation, and thrombosis were reviewed.  She consented.  Proceed with cycle 2 FOLFIRI, will increase irinotecan to 150 mgm2 and 5-FU to 2200 mg/m2 and first bevacizumab.  Phone follow-up for toxicity check next week.  Follow-up and cycle 3 in 2 weeks.  The plan was reviewed with Dr. Burr Medico.   All questions were answered. The patient knows to call the clinic with any problems, questions or concerns. No barriers to learning were detected. Total encounter time was 30 minutes.      [name], NP 08/23/20

## 2020-08-23 NOTE — Patient Instructions (Signed)
Implanted Port Insertion, Care After This sheet gives you information about how to care for yourself after your procedure. Your health care provider may also give you more specific instructions. If you have problems or questions, contact your health care provider. What can I expect after the procedure? After the procedure, it is common to have:  Discomfort at the port insertion site.  Bruising on the skin over the port. This should improve over 3-4 days. Follow these instructions at home: Port care  After your port is placed, you will get a manufacturer's information card. The card has information about your port. Keep this card with you at all times.  Take care of the port as told by your health care provider. Ask your health care provider if you or a family member can get training for taking care of the port at home. A home health care nurse may also take care of the port.  Make sure to remember what type of port you have. Incision care  Follow instructions from your health care provider about how to take care of your port insertion site. Make sure you: ? Wash your hands with soap and water before and after you change your bandage (dressing). If soap and water are not available, use hand sanitizer. ? Change your dressing as told by your health care provider. ? Leave stitches (sutures), skin glue, or adhesive strips in place. These skin closures may need to stay in place for 2 weeks or longer. If adhesive strip edges start to loosen and curl up, you may trim the loose edges. Do not remove adhesive strips completely unless your health care provider tells you to do that.  Check your port insertion site every day for signs of infection. Check for: ? Redness, swelling, or pain. ? Fluid or blood. ? Warmth. ? Pus or a bad smell.      Activity  Return to your normal activities as told by your health care provider. Ask your health care provider what activities are safe for you.  Do not  lift anything that is heavier than 10 lb (4.5 kg), or the limit that you are told, until your health care provider says that it is safe. General instructions  Take over-the-counter and prescription medicines only as told by your health care provider.  Do not take baths, swim, or use a hot tub until your health care provider approves. Ask your health care provider if you may take showers. You may only be allowed to take sponge baths.  Do not drive for 24 hours if you were given a sedative during your procedure.  Wear a medical alert bracelet in case of an emergency. This will tell any health care providers that you have a port.  Keep all follow-up visits as told by your health care provider. This is important. Contact a health care provider if:  You cannot flush your port with saline as directed, or you cannot draw blood from the port.  You have a fever or chills.  You have redness, swelling, or pain around your port insertion site.  You have fluid or blood coming from your port insertion site.  Your port insertion site feels warm to the touch.  You have pus or a bad smell coming from the port insertion site. Get help right away if:  You have chest pain or shortness of breath.  You have bleeding from your port that you cannot control. Summary  Take care of the port as told by your   health care provider. Keep the manufacturer's information card with you at all times.  Change your dressing as told by your health care provider.  Contact a health care provider if you have a fever or chills or if you have redness, swelling, or pain around your port insertion site.  Keep all follow-up visits as told by your health care provider. This information is not intended to replace advice given to you by your health care provider. Make sure you discuss any questions you have with your health care provider. Document Revised: 01/13/2018 Document Reviewed: 01/13/2018 Elsevier Patient Education   2021 [employer]  

## 2020-08-24 ENCOUNTER — Telehealth: Payer: Self-pay

## 2020-08-24 NOTE — Telephone Encounter (Signed)
Brittney Spencer received first dose avastin yesterday.  I called for f/u.  She did not answer.  I left vm.

## 2020-08-25 ENCOUNTER — Other Ambulatory Visit: Payer: Self-pay

## 2020-08-25 ENCOUNTER — Telehealth: Payer: Self-pay

## 2020-08-25 ENCOUNTER — Inpatient Hospital Stay: Payer: Federal, State, Local not specified - PPO

## 2020-08-25 VITALS — BP 123/69 | HR 70 | Temp 98.5°F | Resp 10

## 2020-08-25 DIAGNOSIS — C20 Malignant neoplasm of rectum: Secondary | ICD-10-CM

## 2020-08-25 DIAGNOSIS — Z5111 Encounter for antineoplastic chemotherapy: Secondary | ICD-10-CM | POA: Diagnosis not present

## 2020-08-25 MED ORDER — PEGFILGRASTIM-JMDB 6 MG/0.6ML ~~LOC~~ SOSY
PREFILLED_SYRINGE | SUBCUTANEOUS | Status: AC
  Filled 2020-08-25: qty 0.6

## 2020-08-25 MED ORDER — PEGFILGRASTIM-JMDB 6 MG/0.6ML ~~LOC~~ SOSY
6.0000 mg | PREFILLED_SYRINGE | Freq: Once | SUBCUTANEOUS | Status: AC
  Administered 2020-08-25: 6 mg via SUBCUTANEOUS

## 2020-08-25 MED ORDER — HEPARIN SOD (PORK) LOCK FLUSH 100 UNIT/ML IV SOLN
500.0000 [IU] | Freq: Once | INTRAVENOUS | Status: AC | PRN
  Administered 2020-08-25: 500 [IU]
  Filled 2020-08-25: qty 5

## 2020-08-25 MED ORDER — SODIUM CHLORIDE 0.9% FLUSH
10.0000 mL | INTRAVENOUS | Status: DC | PRN
  Administered 2020-08-25: 10 mL
  Filled 2020-08-25: qty 10

## 2020-08-25 NOTE — Telephone Encounter (Signed)
I spoke with Brittney Spencer regarding chemo side effects as she had Avastin this week for the first time. She had increased nausea and decreased appetite, no vomiting.  I reviewed the use of compazine and zofran.  She states she has developed diarrhea, 3 liquid stools today.  I reviewed the use of imodium, low fiber, bland diet, and increase fluids.  I also told her to call us if after taking imodium she continued to have 4 or more stools per day.  She verbalized understanding.  Written instructions mailed to her today.

## 2020-08-29 NOTE — Progress Notes (Signed)
[place]   Telephone:(336) [phone] Fax:(336) [phone]   Clinic Follow up Note   Patient Care Team: Patient, No Pcp Per as PCP - General (General Practice) [name], NP as Nurse Practitioner (Oncology) [name], MD as Consulting Physician (Oncology) [name], RN as Oncology Nurse Navigator 08/30/2020   I connected with Brittney Spencer on 08/30/20 at  9:30 AM EST by telephone visit and verified that I am speaking with the correct person using two identifiers.   I discussed the limitations, risks, security and privacy concerns of performing an evaluation and management service by telemedicine and the availability of in-person appointments. I also discussed with the patient that there may be a patient responsible charge related to this service. The patient expressed understanding and agreed to proceed.   Other persons participating in the visit and their role in the encounter: None  Patient's location: Home Provider's location: [place] office   CHIEF COMPLAINT: Chemo toxicity check   SUMMARY OF ONCOLOGIC HISTORY: Oncology History Overview Note  Cancer Staging Cancer of ascending colon [facility]) Staging form: Colon and Rectum, AJCC 7th Edition - Clinical stage from 02/07/2016: Stage IIIC (T4b, N1b, M0) - Signed by [name], MD on 03/04/2016    Cancer of ascending colon ([place])  10/25/2015 Imaging   CT ABD/PELVIS:  Inflammatory changes inferior to the cecal tip appear improved, there is still irregular soft tissue thickening of the cecal tip, and there are adjacent prominent lymph nodes in the ileocolonic mesentery, measuring 13 mm on image 49 and 8 mm on image 52. In addition, there is a 2.5 x 1.8 cm nodule on image 46 which has central low density. Therefore, these findings are moderately suspicious for an underlying cecal malignancy with perforation.    01/19/2016 Procedure   COLONOSCOPY per Dr. [name]: Fungating, ulcerated mass almost obstructing mid  ascending colon   01/19/2016 Initial Biopsy   Diagnosis Surgical [P], cecal mass - INVASIVE ADENOCARCINOMA WITH ULCERATION. - SEE COMMENT.   02/05/2016 Tumor Marker   Patient's tumor was tested for the following markers: CEA Results of the tumor marker test revealed 5.7.   02/07/2016 Initial Diagnosis   Cancer of ascending colon ([place])   02/07/2016 Definitive Surgery   Laparoscopic assisted right hemicolectomy and right salpingo oopherectomy--Dr. [name]   02/07/2016 Pathologic Stage   p T4 N1b   2/43 nodes +   02/07/2016 Pathology Results   MMR normal; G2 adenocarcinoma;proximal & distal margins negative; soft tissue mass on pelvic sidewall + for adenocarcinoma with positive margin MSI Stable   03/08/2016 Imaging   CT chest negative for metastasis.    03/19/2016 - 04/25/2016 Radiation Therapy   Adjuvant irradiation, 50 gray in 28 fractions   03/19/2016 - 04/22/2016 Chemotherapy   Xeloda 1500 mg twice daily, started on 03/19/2016, dose reduced to 1000 mg twice daily from week 3 due to neutropenia, and patient stopped 3 days before last dose radiation due to difficulty swallowing the pill    05/20/2016 -  Adjuvant Chemotherapy   Patient declined adjuvant chemotherapy   09/16/2016 Imaging   CT CAP w Contrast 1. No evidence of local tumor recurrence at the ileocolic anastomosis. 2. No findings suspicious for metastatic disease in the chest, abdomen or pelvis. 3. Nonspecific trace free fluid in the pelvic cul-de-sac. 4. Stable solitary 3 mm right upper lobe pulmonary nodule, for which 6 month stability has been demonstrated, probably benign. 5. Additional findings include stable right posterior pericardial cyst and small calcified uterine  fibroids.   05/13/2017 Imaging   CT CAP W Contrast 05/13/17 IMPRESSION: 1. No current findings of residual or recurrent malignancy. 2. Mild prominence of stool throughout the colon. Nondistended portions of the rectum. 3. Several tiny pulmonary  nodules are stable from the earliest available comparison of 03/08/2016 and probably benign, but may merit surveillance. 4. Other imaging findings of potential clinical significance: Old granulomatous disease. Aortoiliac atherosclerotic vascular disease. Lumbar spondylosis and degenerative disc disease. Stable amount of trace free pelvic fluid.   04/27/2018 Imaging   04/27/2018 CT CAP IMPRESSION: Stable exam. No evidence of recurrent or metastatic carcinoma within the chest, abdomen, or pelvis   04/19/2019 Imaging   CT CAP W Contrast  IMPRESSION: Chest Impression:   1. No evidence of thoracic metastasis. 2. Stable small bilateral pulmonary nodules.   Abdomen / Pelvis Impression:   1. No evidence local colorectal carcinoma recurrence or metastasis in the abdomen pelvis. 2. Post RIGHT hemicolectomy.   Rectal cancer metastasized to liver ([place])  06/15/2020 Procedure   Screening Colonoscopy by Dr [name]  IMPRESSION - Decreased sphincter tone and internal hemorrhoids that prolapse with straining, but require manual replacement into the anal canal (Grade III) found on digital rectal exam. - Patent side-to-side ileo-colonic anastomosis, characterized by healthy appearing mucosa. - The examined portion of the ileum was normal. - One diminutive polyp in the proximal transverse colon, removed with a cold biopsy forceps. Resected and retrieved. - Likely malignant partially obstructing tumor in the mid rectum. Biopsied. Tattooed. - The examination was otherwise normal on direct and retroflexion views.   06/15/2020 Initial Biopsy   Diagnosis 1. Transverse Colon Polyp - HYPERPLASTIC POLYP 2. Rectum, biopsy - ADENOCARCINOMA ARISING IN A TUBULAR ADENOMA WITH HIGH-GRADE DYSPLASIA. SEE NOTE Diagnosis Note 2. Dr. [name] reviewed the case and concurs with the diagnosis. Dr. [name] was notified on 06/16/2020.   06/28/2020 Imaging   CT CAP  IMPRESSION: 1. New low-density focus in the  anterior aspect of the lateral segment LEFT hepatic lobe measuring 1.2 x 1.0 cm, compatible with small metastatic lesion in the LEFT hepatic lobe. 2. Soft tissue in the RIGHT iliac fossa following RIGHT hemicolectomy invades the psoas musculature and is slowly enlarging over time, more linear on the prior study now highly concerning for recurrence/metastasis to this location. 3. Signs of enteritis, potentially post radiation changes of the small bowel. Tethered small bowel in the RIGHT lower quadrant shows focal thickening and narrowing suspicious for small bowel involvement and developing partial obstruction though currently contrast passes beyond this point into the colon. 4. Rectal thickening in this patient with known rectal mass as described. 5. No evidence of metastatic disease in the chest. 6. Stable small pulmonary nodules. 7.  and aortic atherosclerosis.   Aortic Atherosclerosis (ICD10-I70.0) and Emphysema (ICD10-J43.9).   07/04/2020 Initial Diagnosis   Rectal cancer metastasized to liver ([place])   07/12/2020 PET scan   IMPRESSION: 1. Exam positive for FDG avid rectal tumor which corresponds to the recent colonoscopy findings. 2. FDG avid soft tissue mass within the right iliac fossa is noted and consistent with local tumor recurrence from previous ascending colon tumor. 3. Lateral segment left lobe of liver lesion is FDG avid concerning for liver metastasis. 4. No specific findings identified to suggest metastatic disease to the chest.   08/10/2020 -  Chemotherapy   First-line FOLFIRI q2weeks starting 08/10/20      CURRENT THERAPY: First-lineFOLFIRI q2weeks starting2/10/22, dose reduced with cycle 1. Irinotecan/5FU increased and Bevacizumab added with cycle 2  on 08/23/2020  INTERVAL HISTORY: Brittney Spencer presents virtually for toxicity check as scheduled.  She completed cycle 2 FOLFIRI with increased doses of irinotecan and 5-FU plus first bevacizumab on 2/23.  She was  doing well for the first few days then things became challenging after pump d/c.  She has had progressive diarrhea that worsened on 2/26 becoming more intense.  She notes a "steady flush" of stool which is completely watery.  She is taking Imodium after each BM which is not helping.  She is losing weight down to 108-109 pounds despite good appetite. She is drinking. Denies dizziness. She also notes her port looks "puffy" without pain, redness, or drainage.  Denies fever or chills.  Denies nausea/vomiting, mucositis, cough, chest pain, dyspnea, neuropathy, or other new concerns.   MEDICAL HISTORY:  Past Medical History:  Diagnosis Date  . AAA (abdominal aortic aneurysm) ([facility])    infrarenal 4.1 cmper s-9-19 scan on chart  . Anemia    hx of  . Anxiety    has PRN meds  . Colon cancer ([place]) 2017   RIGHT hemi colectomy-s/p sx  . GERD (gastroesophageal reflux disease)    OTC meds/diet control  . Hypertension    on meds  . Retinal detachment, right 09/2019  . Vitamin D deficiency     SURGICAL HISTORY: Past Surgical History:  Procedure Laterality Date  . COLONOSCOPY  2018   HD-hams  . COLONSCOPY  12/2015  . IR IMAGING GUIDED PORT INSERTION  08/04/2020  . LAPAROSCOPIC RIGHT HEMI COLECTOMY Right 02/07/2016   Procedure: LAPAROSCOPIC ASSISTED RIGHT HEMI COLECTOMY AND RIGHT SALPINGO OOPHERECTOMY;  Surgeon: [name], MD;  Location: [facility];  Service: General;  Laterality: Right;  . RETINAL DETACHMENT SURGERY  09/2019    I have reviewed the social history and family history with the patient and they are unchanged from previous note.  ALLERGIES:  is allergic to fish allergy, peanut-containing drug products, soy allergy, and buspirone.  MEDICATIONS:  Current Outpatient Medications  Medication Sig Dispense Refill  . diphenoxylate-atropine (LOMOTIL) 2.5-0.025 MG tablet Take 2 tablets by mouth 4 (four) times daily as needed for diarrhea or loose stools. 45 tablet 3  . ALPRAZolam (XANAX) 0.25  MG tablet Take 1 tablet (0.25 mg total) by mouth daily as needed for anxiety. 30 tablet 0  . Cholecalciferol (VITAMIN D3 PO) Take by mouth daily.    Marland Kitchen docusate sodium (COLACE) 100 MG capsule 1 capsule as needed    . lidocaine-prilocaine (EMLA) cream Apply 1 application topically as needed. 30 g 1  . NORVASC 2.5 MG tablet Take 1 tablet (2.5 mg total) by mouth daily. 30 tablet 3  . ondansetron (ZOFRAN) 8 MG tablet Take 1 tablet (8 mg total) by mouth every 8 (eight) hours as needed for nausea or vomiting. 20 tablet 2  . potassium chloride (KLOR-CON) 10 MEQ tablet Take 1 tablet (10 mEq total) by mouth daily. 10 tablet 0  . prochlorperazine (COMPAZINE) 10 MG tablet Take 1 tablet (10 mg total) by mouth every 6 (six) hours as needed for nausea or vomiting. 30 tablet 2   Current Facility-Administered Medications  Medication Dose Route Frequency Provider Last Rate Last Admin  . 0.9 %  sodium chloride infusion  500 mL Intravenous Once Nelida Meuse III, MD      . 0.9 %  sodium chloride infusion   Intravenous Continuous [name], MD   Stopped at 08/30/20 1333  . 0.9 %  sodium chloride infusion   Intravenous  Once [name], NP        PHYSICAL EXAMINATION: ECOG PERFORMANCE STATUS: 1 - Symptomatic but completely ambulatory  There were no vitals filed for this visit. There were no vitals filed for this visit.  Patient appears well over the phone. Voice is strong with some throat clearing. Speech is clear. No cough or conversational dyspnea.   Patient brought in for supportive case. Well appearing in no distress sitting in recliner. No rash. Sclera anicteric. Normal breathing effort. Abdomen soft, flat and nontender with normal bowel signs. Neuro nonfocal. PAC covered with gauze dressing.   LABORATORY DATA:  I have reviewed the data as listed CBC Latest Ref Rng & Units 08/30/2020 08/23/2020 08/10/2020  WBC 4.0 - 10.5 K/uL 10.2 4.8 3.3(L)  Hemoglobin 12.0 - 15.0 g/dL 10.2(L) 9.9(L) 10.2(L)   Hematocrit 36.0 - 46.0 % 31.7(L) 31.2(L) 31.8(L)  Platelets 150 - 400 K/uL 169 157 160     CMP Latest Ref Rng & Units 08/30/2020 08/23/2020 08/10/2020  Glucose 70 - 99 mg/dL 70 100(H) 94  BUN 8 - 23 mg/dL _0 Creatinine 0.44 - 1.00 mg/dL 0.81 0.79 0.77  Sodium 135 - 145 mmol/L 136 135 136  Potassium 3.5 - 5.1 mmol/L 3.5 3.6 3.7  Chloride 98 - 111 mmol/L 99 103 102  CO2 22 - 32 mmol/L _1 Calcium 8.9 - 10.3 mg/dL 8.8(L) 8.8(L) 9.5  Total Protein 6.5 - 8.1 g/dL 6.7 6.6 7.1  Total Bilirubin 0.3 - 1.2 mg/dL 0.4 0.3 0.5  Alkaline Phos 38 - 126 U/L 79 71 74  AST 15 - 41 U/L 16 14(L) 20  ALT 0 - 44 U/L _2 RADIOGRAPHIC STUDIES: I have personally reviewed the radiological images as listed and agreed with the findings in the report. No results found.   ASSESSMENT & PLAN: Brittney Enzor Flemingis a 70 y.o.femalewith   1. Rectal adenocarcinoma, withliverand right pelvic metastasis, recurrence from previous colon cancer vs new primary -Diagnosed on routine screening colonoscopy 05/2020, rectal mass biopsy showed invasive adenocarcinoma, arising from a tubular adenoma -Her 1/12/22PETshowedpositive uptake of known rectal tumor and soft tissue mass within the right iliac fossa indicating recurrent prior colon cancer. There is alsoleft lobe of liver lesion is FDG avid concerning for liver metastasis.pt declined liver biopsy  -whether this is metastatic rectal or recurrent/metastatic colon cancer, this is likely not curable, but still treatable. She previously declined referral for HIPEC -her FO is still pending, will see if she is eligible for EGFR inhibitor -She began first-line systemic chemo with dose-reduced FOLFIRI on 08/06/20.  -Bevacizumab added, and irinotecan/5FU increased with cycle 2  2. Cancer of ascending colon, pT4bN1bM0, stage IIIC, MSI-stable, (+) surgical margins at pelvic wall -She was diagnosed in 12/2015. She is s/p right hemicolectomy with  rightsalpingo oophorectomyand adjuvant ChemoRT.  -she declined adjuvant chemo due to the concern of side effects and impact on her quality of life. -Her 04/2019 CT scan was NED -With recent rectal cancer diagnosis, herPET from 07/12/20 indicates soft tissue mass within the right iliac fossa is noted and consistent with local tumor recurrence from previous ascendingcolon tumor.  -She declined option of liver biopsy.   3. Mild Anemia  -Has been stable and mild for the past year. -worse lately due to her newly diagnosed rectal cancer and starting chemo -denies bleeding -monitoring  4. HTN, Anxiety -She'll follow-up with her primary care physician and continue medication -She uses Xanax as  needed. -stable, not worse since her recent rectal cancer diagnosis  5. Hypokalemia  -K at 3.3on1/28/22, on oral K -WNL today, continue supplement   Disposition:  Brittney Spencer appeared stable over the phone. She is s/p C2D8 FOLFIRI (dose increased irinotecan and 5FU) and first beva. She developed progressive diarrhea and weight loss. She is not managing well at home. She will come in for lab and supportive care today. Appointments have been made. I will go ahead and prescribe lomotil.   She was brought in for supportive care and examined in the infusion room. We discussed symptom management. I will ask dietician to f/up. She will add lomotil to control diarrhea. She knows to continue to hydrate well at home. Next visit 3/10 for cycle 3. If she does not recover well, will dose-reduce. She knows to call sooner if symptoms worsen or fail to improve.   Orders Placed This Encounter  Procedures  . Magnesium    Standing Status:   Future    Number of Occurrences:   1    Standing Expiration Date:   08/30/2021   All questions were answered. The patient knows to call the clinic with any problems, questions or concerns. No barriers to learning were detected. Total encounter time was 30 minutes.       [name], NP 08/30/20

## 2020-08-30 ENCOUNTER — Inpatient Hospital Stay: Payer: Federal, State, Local not specified - PPO | Attending: Nurse Practitioner | Admitting: Nurse Practitioner

## 2020-08-30 ENCOUNTER — Other Ambulatory Visit: Payer: Self-pay

## 2020-08-30 ENCOUNTER — Encounter: Payer: Self-pay | Admitting: Nurse Practitioner

## 2020-08-30 ENCOUNTER — Inpatient Hospital Stay: Payer: Federal, State, Local not specified - PPO

## 2020-08-30 VITALS — BP 113/66 | HR 88 | Resp 18

## 2020-08-30 DIAGNOSIS — C20 Malignant neoplasm of rectum: Secondary | ICD-10-CM | POA: Diagnosis not present

## 2020-08-30 DIAGNOSIS — Z5189 Encounter for other specified aftercare: Secondary | ICD-10-CM | POA: Insufficient documentation

## 2020-08-30 DIAGNOSIS — D649 Anemia, unspecified: Secondary | ICD-10-CM | POA: Insufficient documentation

## 2020-08-30 DIAGNOSIS — Z923 Personal history of irradiation: Secondary | ICD-10-CM | POA: Insufficient documentation

## 2020-08-30 DIAGNOSIS — Z5111 Encounter for antineoplastic chemotherapy: Secondary | ICD-10-CM | POA: Insufficient documentation

## 2020-08-30 DIAGNOSIS — Z5112 Encounter for antineoplastic immunotherapy: Secondary | ICD-10-CM | POA: Insufficient documentation

## 2020-08-30 DIAGNOSIS — C182 Malignant neoplasm of ascending colon: Secondary | ICD-10-CM | POA: Diagnosis present

## 2020-08-30 DIAGNOSIS — Z95828 Presence of other vascular implants and grafts: Secondary | ICD-10-CM

## 2020-08-30 DIAGNOSIS — C787 Secondary malignant neoplasm of liver and intrahepatic bile duct: Secondary | ICD-10-CM

## 2020-08-30 DIAGNOSIS — Z9221 Personal history of antineoplastic chemotherapy: Secondary | ICD-10-CM | POA: Diagnosis not present

## 2020-08-30 LAB — CBC WITH DIFFERENTIAL (CANCER CENTER ONLY)
Abs Immature Granulocytes: 0.18 10*3/uL — ABNORMAL HIGH (ref 0.00–0.07)
Basophils Absolute: 0 10*3/uL (ref 0.0–0.1)
Basophils Relative: 0 %
Eosinophils Absolute: 0.2 10*3/uL (ref 0.0–0.5)
Eosinophils Relative: 2 %
HCT: 31.7 % — ABNORMAL LOW (ref 36.0–46.0)
Hemoglobin: 10.2 g/dL — ABNORMAL LOW (ref 12.0–15.0)
Immature Granulocytes: 2 %
Lymphocytes Relative: 8 %
Lymphs Abs: 0.8 10*3/uL (ref 0.7–4.0)
MCH: 27.3 pg (ref 26.0–34.0)
MCHC: 32.2 g/dL (ref 30.0–36.0)
MCV: 84.8 fL (ref 80.0–100.0)
Monocytes Absolute: 1.1 10*3/uL — ABNORMAL HIGH (ref 0.1–1.0)
Monocytes Relative: 10 %
Neutro Abs: 8 10*3/uL — ABNORMAL HIGH (ref 1.7–7.7)
Neutrophils Relative %: 78 %
Platelet Count: 169 10*3/uL (ref 150–400)
RBC: 3.74 MIL/uL — ABNORMAL LOW (ref 3.87–5.11)
RDW: 13.4 % (ref 11.5–15.5)
WBC Count: 10.2 10*3/uL (ref 4.0–10.5)
WBC Morphology: INCREASED
nRBC: 0 % (ref 0.0–0.2)

## 2020-08-30 LAB — CMP (CANCER CENTER ONLY)
ALT: 10 U/L (ref 0–44)
AST: 16 U/L (ref 15–41)
Albumin: 3.6 g/dL (ref 3.5–5.0)
Alkaline Phosphatase: 79 U/L (ref 38–126)
Anion gap: 14 (ref 5–15)
BUN: 10 mg/dL (ref 8–23)
CO2: 23 mmol/L (ref 22–32)
Calcium: 8.8 mg/dL — ABNORMAL LOW (ref 8.9–10.3)
Chloride: 99 mmol/L (ref 98–111)
Creatinine: 0.81 mg/dL (ref 0.44–1.00)
GFR, Estimated: >60 mL/min (ref >60)
Glucose, Bld: 70 mg/dL (ref 70–99)
Potassium: 3.5 mmol/L (ref 3.5–5.1)
Sodium: 136 mmol/L (ref 135–145)
Total Bilirubin: 0.4 mg/dL (ref 0.3–1.2)
Total Protein: 6.7 g/dL (ref 6.5–8.1)

## 2020-08-30 LAB — MAGNESIUM: Magnesium: 1.9 mg/dL (ref 1.7–2.4)

## 2020-08-30 MED ORDER — SODIUM CHLORIDE 0.9 % IV SOLN
INTRAVENOUS | Status: DC
  Filled 2020-08-30 (×2): qty 250

## 2020-08-30 MED ORDER — HEPARIN SOD (PORK) LOCK FLUSH 100 UNIT/ML IV SOLN
500.0000 [IU] | Freq: Once | INTRAVENOUS | Status: AC
  Administered 2020-08-30: 500 [IU] via INTRAVENOUS
  Filled 2020-08-30: qty 5

## 2020-08-30 MED ORDER — DIPHENOXYLATE-ATROPINE 2.5-0.025 MG PO TABS: 2.0000 | ORAL_TABLET | Freq: Four times a day (QID) | ORAL | 3 refills | Status: DC | PRN

## 2020-08-30 MED ORDER — SODIUM CHLORIDE 0.9% FLUSH
10.0000 mL | Freq: Once | INTRAVENOUS | Status: AC
  Administered 2020-08-30: 10 mL via INTRAVENOUS
  Filled 2020-08-30: qty 10

## 2020-08-30 MED ORDER — SODIUM CHLORIDE 0.9 % IV SOLN
Freq: Once | INTRAVENOUS | Status: DC
  Filled 2020-08-30: qty 250

## 2020-08-30 NOTE — Patient Instructions (Signed)
Rehydration, Adult Rehydration is the replacement of body fluids, salts, and minerals (electrolytes) that are lost during dehydration. Dehydration is when there is not enough water or other fluids in the body. This happens when you lose more fluids than you take in. Common causes of dehydration include:  Not drinking enough fluids. This can occur when you are ill or doing activities that require a lot of energy, especially in hot weather.  Conditions that cause loss of water or other fluids, such as diarrhea, vomiting, sweating, or urinating a lot.  Other illnesses, such as fever or infection.  Certain medicines, such as those that remove excess fluid from the body (diuretics). Symptoms of mild or moderate dehydration may include thirst, dry lips and mouth, and dizziness. Symptoms of severe dehydration may include increased heart rate, confusion, fainting, and not urinating. For severe dehydration, you may need to get fluids through an IV at the hospital. For mild or moderate dehydration, you can usually rehydrate at home by drinking certain fluids as told by your health care provider. What are the risks? Generally, rehydration is safe. However, taking in too much fluid (overhydration) can be a problem. This is rare. Overhydration can cause an electrolyte imbalance, kidney failure, or a decrease in salt (sodium) levels in the body. Supplies needed You will need an oral rehydration solution (ORS) if your health care provider tells you to use one. This is a drink to treat dehydration. It can be found in pharmacies and retail stores. How to rehydrate Fluids Follow instructions from your health care provider for rehydration. The kind of fluid and the amount you should drink depend on your condition. In general, you should choose drinks that you prefer.  If told by your health care provider, drink an ORS. ? Make an ORS by following instructions on the package. ? Start by drinking small amounts,  about  cup (120 mL) every 5-10 minutes. ? Slowly increase how much you drink until you have taken the amount recommended by your health care provider.  Drink enough clear fluids to keep your urine pale yellow. If you were told to drink an ORS, finish it first, then start slowly drinking other clear fluids. Drink fluids such as: ? Water. This includes sparkling water and flavored water. Drinking only water can lead to having too little sodium in your body (hyponatremia). Follow the advice of your health care provider. ? Water from ice chips you suck on. ? Fruit juice with water you add to it (diluted). ? Sports drinks. ? Hot or cold herbal teas. ? Broth-based soups. ? Milk or milk products. Food Follow instructions from your health care provider about what to eat while you rehydrate. Your health care provider may recommend that you slowly begin eating regular foods in small amounts.  Eat foods that contain a healthy balance of electrolytes, such as bananas, oranges, potatoes, tomatoes, and spinach.  Avoid foods that are greasy or contain a lot of sugar. In some cases, you may get nutrition through a feeding tube that is passed through your nose and into your stomach (nasogastric tube, or NG tube). This may be done if you have uncontrolled vomiting or diarrhea.   Beverages to avoid Certain beverages may make dehydration worse. While you rehydrate, avoid drinking alcohol.   How to tell if you are recovering from dehydration You may be recovering from dehydration if:  You are urinating more often than before you started rehydrating.  Your urine is pale yellow.  Your energy level   improves.  You vomit less frequently.  You have diarrhea less frequently.  Your appetite improves or returns to normal.  You feel less dizzy or less light-headed.  Your skin tone and color start to look more normal. Follow these instructions at home:  Take over-the-counter and prescription medicines only  as told by your health care provider.  Do not take sodium tablets. Doing this can lead to having too much sodium in your body (hypernatremia). Contact a health care provider if:  You continue to have symptoms of mild or moderate dehydration, such as: ? Thirst. ? Dry lips. ? Slightly dry mouth. ? Dizziness. ? Dark urine or less urine than normal. ? Muscle cramps.  You continue to vomit or have diarrhea. Get help right away if you:  Have symptoms of dehydration that get worse.  Have a fever.  Have a severe headache.  Have been vomiting and the following happens: ? Your vomiting gets worse or does not go away. ? Your vomit includes blood or green matter (bile). ? You cannot eat or drink without vomiting.  Have problems with urination or bowel movements, such as: ? Diarrhea that gets worse or does not go away. ? Blood in your stool (feces). This may cause stool to look black and tarry. ? Not urinating, or urinating only a small amount of very dark urine, within 6-8 hours.  Have trouble breathing.  Have symptoms that get worse with treatment. These symptoms may represent a serious problem that is an emergency. Do not wait to see if the symptoms will go away. Get medical help right away. Call your local emergency services (911 in the [place] Do not drive yourself to the hospital. Summary  Rehydration is the replacement of body fluids and minerals (electrolytes) that are lost during dehydration.  Follow instructions from your health care provider for rehydration. The kind of fluid and amount you should drink depend on your condition.  Slowly increase how much you drink until you have taken the amount recommended by your health care provider.  Contact your health care provider if you continue to show signs of mild or moderate dehydration. This information is not intended to replace advice given to you by your health care provider. Make sure you discuss any questions you have with  your health care provider. Document Revised: 08/18/2019 Document Reviewed: 06/28/2019 Elsevier Patient Education  2021 [employer]  

## 2020-09-04 ENCOUNTER — Telehealth: Payer: Self-pay | Admitting: Nutrition

## 2020-09-04 NOTE — Progress Notes (Signed)
[place]   [phone] [phone]   Clinic Follow up Note   Patient Care Team: Patient, No Pcp Per as PCP - General (General Practice) [name], NP as Nurse Practitioner (Oncology) [name], MD as Consulting Physician (Oncology) [name], RN as Oncology Nurse Navigator  Date of Service:  09/07/2020  CHIEF COMPLAINT: Follow up stage IIIC right colon cancerand newly diagnosed rectal cancer  SUMMARY OF ONCOLOGIC HISTORY: Oncology History Overview Note  Cancer Staging Cancer of ascending colon [facility]) Staging form: Colon and Rectum, AJCC 7th Edition - Clinical stage from 02/07/2016: Stage IIIC (T4b, N1b, M0) - Signed by [name], MD on 03/04/2016    Cancer of ascending colon ([place])  10/25/2015 Imaging   CT ABD/PELVIS:  Inflammatory changes inferior to the cecal tip appear improved, there is still irregular soft tissue thickening of the cecal tip, and there are adjacent prominent lymph nodes in the ileocolonic mesentery, measuring 13 mm on image 49 and 8 mm on image 52. In addition, there is a 2.5 x 1.8 cm nodule on image 46 which has central low density. Therefore, these findings are moderately suspicious for an underlying cecal malignancy with perforation.    01/19/2016 Procedure   COLONOSCOPY per Dr. [name]: Fungating, ulcerated mass almost obstructing mid ascending colon   01/19/2016 Initial Biopsy   Diagnosis Surgical [P], cecal mass - INVASIVE ADENOCARCINOMA WITH ULCERATION. - SEE COMMENT.   02/05/2016 Tumor Marker   Patient's tumor was tested for the following markers: CEA Results of the tumor marker test revealed 5.7.   02/07/2016 Initial Diagnosis   Cancer of ascending colon ([place])   02/07/2016 Definitive Surgery   Laparoscopic assisted right hemicolectomy and right salpingo oopherectomy--Dr. [name]   02/07/2016 Pathologic Stage   p T4 N1b   2/43 nodes +   02/07/2016 Pathology Results   MMR normal; G2  adenocarcinoma;proximal & distal margins negative; soft tissue mass on pelvic sidewall + for adenocarcinoma with positive margin MSI Stable   03/08/2016 Imaging   CT chest negative for metastasis.    03/19/2016 - 04/25/2016 Radiation Therapy   Adjuvant irradiation, 50 gray in 28 fractions   03/19/2016 - 04/22/2016 Chemotherapy   Xeloda 1500 mg twice daily, started on 03/19/2016, dose reduced to 1000 mg twice daily from week 3 due to neutropenia, and patient stopped 3 days before last dose radiation due to difficulty swallowing the pill    05/20/2016 -  Adjuvant Chemotherapy   Patient declined adjuvant chemotherapy   09/16/2016 Imaging   CT CAP w Contrast 1. No evidence of local tumor recurrence at the ileocolic anastomosis. 2. No findings suspicious for metastatic disease in the chest, abdomen or pelvis. 3. Nonspecific trace free fluid in the pelvic cul-de-sac. 4. Stable solitary 3 mm right upper lobe pulmonary nodule, for which 6 month stability has been demonstrated, probably benign. 5. Additional findings include stable right posterior pericardial cyst and small calcified uterine fibroids.   05/13/2017 Imaging   CT CAP W Contrast 05/13/17 IMPRESSION: 1. No current findings of residual or recurrent malignancy. 2. Mild prominence of stool throughout the colon. Nondistended portions of the rectum. 3. Several tiny pulmonary nodules are stable from the earliest available comparison of 03/08/2016 and probably benign, but may merit surveillance. 4. Other imaging findings of potential clinical significance: Old granulomatous disease. Aortoiliac atherosclerotic vascular disease. Lumbar spondylosis and degenerative disc disease. Stable amount of trace free pelvic fluid.   04/27/2018 Imaging   04/27/2018 CT CAP IMPRESSION: Stable  exam. No evidence of recurrent or metastatic carcinoma within the chest, abdomen, or pelvis   04/19/2019 Imaging   CT CAP W Contrast  IMPRESSION: Chest  Impression:   1. No evidence of thoracic metastasis. 2. Stable small bilateral pulmonary nodules.   Abdomen / Pelvis Impression:   1. No evidence local colorectal carcinoma recurrence or metastasis in the abdomen pelvis. 2. Post RIGHT hemicolectomy.   Rectal cancer metastasized to liver ([place])  06/15/2020 Procedure   Screening Colonoscopy by Dr [name]  IMPRESSION - Decreased sphincter tone and internal hemorrhoids that prolapse with straining, but require manual replacement into the anal canal (Grade III) found on digital rectal exam. - Patent side-to-side ileo-colonic anastomosis, characterized by healthy appearing mucosa. - The examined portion of the ileum was normal. - One diminutive polyp in the proximal transverse colon, removed with a cold biopsy forceps. Resected and retrieved. - Likely malignant partially obstructing tumor in the mid rectum. Biopsied. Tattooed. - The examination was otherwise normal on direct and retroflexion views.   06/15/2020 Initial Biopsy   Diagnosis 1. Transverse Colon Polyp - HYPERPLASTIC POLYP 2. Rectum, biopsy - ADENOCARCINOMA ARISING IN A TUBULAR ADENOMA WITH HIGH-GRADE DYSPLASIA. SEE NOTE Diagnosis Note 2. Dr. [name] reviewed the case and concurs with the diagnosis. Dr. [name] was notified on 06/16/2020.   06/28/2020 Imaging   CT CAP  IMPRESSION: 1. New low-density focus in the anterior aspect of the lateral segment LEFT hepatic lobe measuring 1.2 x 1.0 cm, compatible with small metastatic lesion in the LEFT hepatic lobe. 2. Soft tissue in the RIGHT iliac fossa following RIGHT hemicolectomy invades the psoas musculature and is slowly enlarging over time, more linear on the prior study now highly concerning for recurrence/metastasis to this location. 3. Signs of enteritis, potentially post radiation changes of the small bowel. Tethered small bowel in the RIGHT lower quadrant shows focal thickening and narrowing suspicious for small  bowel involvement and developing partial obstruction though currently contrast passes beyond this point into the colon. 4. Rectal thickening in this patient with known rectal mass as described. 5. No evidence of metastatic disease in the chest. 6. Stable small pulmonary nodules. 7.  and aortic atherosclerosis.   Aortic Atherosclerosis (ICD10-I70.0) and Emphysema (ICD10-J43.9).   07/04/2020 Initial Diagnosis   Rectal cancer metastasized to liver ([place])   07/12/2020 PET scan   IMPRESSION: 1. Exam positive for FDG avid rectal tumor which corresponds to the recent colonoscopy findings. 2. FDG avid soft tissue mass within the right iliac fossa is noted and consistent with local tumor recurrence from previous ascending colon tumor. 3. Lateral segment left lobe of liver lesion is FDG avid concerning for liver metastasis. 4. No specific findings identified to suggest metastatic disease to the chest.   08/10/2020 -  Chemotherapy   First-line FOLFIRI q2weeks starting 08/10/20. dose reduced with cycle 1. Irinotecan/5FU increased and Bevacizumab added with cycle 2 on 08/23/2020       CURRENT THERAPY:  First-line FOLFIRI q2weeks starting 08/10/20. dose reduced with cycle 1. Bevacizumab added with cycle 2 on 08/23/2020  INTERVAL HISTORY:  SHANINA KEPPLE is here for a follow up. She presents to the clinic alone. She had moderate to severe diarrhea since last cycle chemo, lasted about 2-3 days, responded well to lomotil. She lost about 7 lbs in past 2 weeks  She is doing much better this week, eating normally and trying to gain weight back  No pain or other complains    All other systems were reviewed with  the patient and are negative.  MEDICAL HISTORY:  Past Medical History:  Diagnosis Date  . AAA (abdominal aortic aneurysm) ([facility])    infrarenal 4.1 cmper s-9-19 scan on chart  . Anemia    hx of  . Anxiety    has PRN meds  . Colon cancer ([place]) 2017   RIGHT hemi colectomy-s/p sx  .  GERD (gastroesophageal reflux disease)    OTC meds/diet control  . Hypertension    on meds  . Retinal detachment, right 09/2019  . Vitamin D deficiency     SURGICAL HISTORY: Past Surgical History:  Procedure Laterality Date  . COLONOSCOPY  2018   [facility]-hams  . COLONSCOPY  12/2015  . IR IMAGING GUIDED PORT INSERTION  08/04/2020  . LAPAROSCOPIC RIGHT HEMI COLECTOMY Right 02/07/2016   Procedure: LAPAROSCOPIC ASSISTED RIGHT HEMI COLECTOMY AND RIGHT SALPINGO OOPHERECTOMY;  Surgeon: [name], MD;  Location: [facility];  Service: General;  Laterality: Right;  . RETINAL DETACHMENT SURGERY  09/2019    I have reviewed the social history and family history with the patient and they are unchanged from previous note.  ALLERGIES:  is allergic to fish allergy, peanut-containing drug products, soy allergy, and buspirone.  MEDICATIONS:  Current Outpatient Medications  Medication Sig Dispense Refill  . ALPRAZolam (XANAX) 0.25 MG tablet Take 1 tablet (0.25 mg total) by mouth daily as needed for anxiety. 30 tablet 0  . Cholecalciferol (VITAMIN D3 PO) Take by mouth daily.    . diphenoxylate-atropine (LOMOTIL) 2.5-0.025 MG tablet Take 2 tablets by mouth 4 (four) times daily as needed for diarrhea or loose stools. 45 tablet 3  . docusate sodium (COLACE) 100 MG capsule 1 capsule as needed    . lidocaine-prilocaine (EMLA) cream Apply 1 application topically as needed. 30 g 1  . NORVASC 2.5 MG tablet Take 1 tablet (2.5 mg total) by mouth daily. 30 tablet 3  . ondansetron (ZOFRAN) 8 MG tablet Take 1 tablet (8 mg total) by mouth every 8 (eight) hours as needed for nausea or vomiting. 20 tablet 2  . potassium chloride (KLOR-CON) 10 MEQ tablet Take 1 tablet (10 mEq total) by mouth daily. 10 tablet 0  . prochlorperazine (COMPAZINE) 10 MG tablet Take 1 tablet (10 mg total) by mouth every 6 (six) hours as needed for nausea or vomiting. 30 tablet 2   Current Facility-Administered Medications  Medication Dose Route  Frequency Provider Last Rate Last Admin  . 0.9 %  sodium chloride infusion  500 mL Intravenous Once Nelida Meuse III, MD        PHYSICAL EXAMINATION: ECOG PERFORMANCE STATUS: 1 - Symptomatic but completely ambulatory  Vitals:   09/07/20 0922  BP: 107/79  Pulse: 85  Resp: 18  Temp: (!) 96.1 F (35.6 C)  SpO2: 100%   Filed Weights   09/07/20 0922  Weight: 107 lb (48.5 kg)    GENERAL:alert, no distress and comfortable SKIN: skin color, texture, turgor are normal, no rashes or significant lesions EYES: normal, Conjunctiva are pink and non-injected, sclera clear NECK: supple, thyroid normal size, non-tender, without nodularity LYMPH:  no palpable lymphadenopathy in the cervical, axillary  LUNGS: clear to auscultation and percussion with normal breathing effort HEART: regular rate & rhythm and no murmurs and no lower extremity edema ABDOMEN:abdomen soft, non-tender and normal bowel sounds Musculoskeletal:no cyanosis of digits and no clubbing  NEURO: alert & oriented x 3 with fluent speech, no focal motor/sensory deficits  LABORATORY DATA:  I have reviewed the data as  listed CBC Latest Ref Rng & Units 09/07/2020 08/30/2020 08/23/2020  WBC 4.0 - 10.5 K/uL 7.1 10.2 4.8  Hemoglobin 12.0 - 15.0 g/dL 10.0(L) 10.2(L) 9.9(L)  Hematocrit 36.0 - 46.0 % 31.1(L) 31.7(L) 31.2(L)  Platelets 150 - 400 K/uL 248 169 157     CMP Latest Ref Rng & Units 08/30/2020 08/23/2020 08/10/2020  Glucose 70 - 99 mg/dL 70 100(H) 94  BUN 8 - 23 mg/dL _0 Creatinine 0.44 - 1.00 mg/dL 0.81 0.79 0.77  Sodium 135 - 145 mmol/L 136 135 136  Potassium 3.5 - 5.1 mmol/L 3.5 3.6 3.7  Chloride 98 - 111 mmol/L 99 103 102  CO2 22 - 32 mmol/L _1 Calcium 8.9 - 10.3 mg/dL 8.8(L) 8.8(L) 9.5  Total Protein 6.5 - 8.1 g/dL 6.7 6.6 7.1  Total Bilirubin 0.3 - 1.2 mg/dL 0.4 0.3 0.5  Alkaline Phos 38 - 126 U/L 79 71 74  AST 15 - 41 U/L 16 14(L) 20  ALT 0 - 44 U/L _2 RADIOGRAPHIC STUDIES: I have  personally reviewed the radiological images as listed and agreed with the findings in the report. No results found.   ASSESSMENT & PLAN:  Brittney Spencer is a 70 y.o. female with   1. Rectal adenocarcinoma, withliverand right pelvic metastasis, recurrence from previous colon cancer vs new primary -After rectal mass was found on routine screening colonoscopyin 05/2020, her12/16/21 biopsy showed invasive adenocarcinoma, arising from a tubular adenoma.  -Her 1/12/22PETshowedpositive uptake of known rectal tumor and soft tissue mass within the right iliac fossa indicating recurrent prior colon cancer. There is alsoleft lobe of liver lesion is FDG avid concerning for liver metastasis. -Since her PET scan are pretty convincing for metastatic disease, pt declined liver biopsy. I agreed.  -I discussed whether rectalcancerorrecurrent colon cancer, however her cancer isprobably not resectable andnot curable at this stage, based on our tumor board discussion with our local surgeons.We discussed option [facility].She declined outside surgical opinion. -I started her on first-line FOLFIRI q2weeks on 08/10/20. Bevacizumab was added with C2 on 08/23/20.  -S/p C2 she did not tolerate cycle 2 well due to significant diarrhea.  She has recovered well, except weight loss. -We discussed the option of postpone her chemotherapy to next week, pt feels she has recovered well, wishes to proceed today, will reduce irinotecan dose back to 130 mg/m,and we reviewed diarrhea management again. -virtual visit next week  -next cycle chemo in 2 weeks    2. Cancer of ascending colon, pT4bN1bM0, stage IIIC, MSI-stable, (+) surgical margins at pelvic wall -She was diagnosed in 12/2015. She is s/p right hemicolectomy with rightsalpingo oophorectomyand adjuvant ChemoRT.  -Although I previously recommend adjuvant chemotherapy, patient declined chemotherapy due to the concern of side effects and impact  on her quality of life. -With recent rectal cancer diagnosis, herPET from 07/12/20 indicates soft tissue mass within the right iliac fossa is noted and consistent with local tumor recurrence from previous ascendingcolon tumor. -She declined option of liver biopsy, but has proceeded with chemo treatment   3. Mild Anemia  -Has been stable and mild for the past year. -worse lately due to her newly diagnosed rectal cancer. Will monitor.  4. HTN, Anxiety -She'll follow-up with her primary care physician and continue medication -She uses Xanax as needed. Stable.   5. Hypokalemia  -Continue oral potassium once daily  Plan -Labs reviewed and adequate to proceed with C3 FOLFIRIand bevacizumab today  with irinotecan dose reduction to 130 mg/m -Lab, flush, f/u and FOLFIRIand bevacizumab in 2 and 4 weeks  -Phone visit next week for toxicity checkup -Lab, follow-up and cycle 4 chemo in 2 weeks -She will meet with dietitian today   No problem-specific Assessment & Plan notes found for this encounter.   No orders of the defined types were placed in this encounter.  All questions were answered. The patient knows to call the clinic with any problems, questions or concerns. No barriers to learning was detected. The total time spent in the appointment was 30 minutes.     [name], MD 09/07/2020   I, Joslyn Devon, am acting as scribe for [name], MD.   I have reviewed the above documentation for accuracy and completeness, and I agree with the above.

## 2020-09-04 NOTE — Telephone Encounter (Signed)
I was asked to contact patient by telephone regarding diarrhea and weight loss.  Placed call to patient today however she is not available and did not answer.  I was able to leave a message with my name and phone number and requested a return call.  I have patient scheduled for in person follow-up this week when she comes in for treatment.

## 2020-09-07 ENCOUNTER — Inpatient Hospital Stay (HOSPITAL_BASED_OUTPATIENT_CLINIC_OR_DEPARTMENT_OTHER): Payer: Federal, State, Local not specified - PPO | Admitting: Hematology

## 2020-09-07 ENCOUNTER — Inpatient Hospital Stay: Payer: Federal, State, Local not specified - PPO

## 2020-09-07 ENCOUNTER — Inpatient Hospital Stay: Payer: Federal, State, Local not specified - PPO | Admitting: Nutrition

## 2020-09-07 ENCOUNTER — Encounter: Payer: Self-pay | Admitting: Hematology

## 2020-09-07 ENCOUNTER — Other Ambulatory Visit: Payer: Self-pay

## 2020-09-07 VITALS — BP 107/79 | HR 85 | Temp 96.1°F | Resp 18 | Ht 63.0 in | Wt 107.0 lb

## 2020-09-07 DIAGNOSIS — C20 Malignant neoplasm of rectum: Secondary | ICD-10-CM

## 2020-09-07 DIAGNOSIS — Z5112 Encounter for antineoplastic immunotherapy: Secondary | ICD-10-CM | POA: Diagnosis not present

## 2020-09-07 DIAGNOSIS — I10 Essential (primary) hypertension: Secondary | ICD-10-CM | POA: Diagnosis not present

## 2020-09-07 DIAGNOSIS — C182 Malignant neoplasm of ascending colon: Secondary | ICD-10-CM | POA: Diagnosis not present

## 2020-09-07 DIAGNOSIS — C787 Secondary malignant neoplasm of liver and intrahepatic bile duct: Secondary | ICD-10-CM

## 2020-09-07 DIAGNOSIS — Z95828 Presence of other vascular implants and grafts: Secondary | ICD-10-CM

## 2020-09-07 DIAGNOSIS — D500 Iron deficiency anemia secondary to blood loss (chronic): Secondary | ICD-10-CM | POA: Diagnosis not present

## 2020-09-07 LAB — CBC WITH DIFFERENTIAL (CANCER CENTER ONLY)
Abs Immature Granulocytes: 0.04 10*3/uL (ref 0.00–0.07)
Basophils Absolute: 0 10*3/uL (ref 0.0–0.1)
Basophils Relative: 0 %
Eosinophils Absolute: 0.1 10*3/uL (ref 0.0–0.5)
Eosinophils Relative: 1 %
HCT: 31.1 % — ABNORMAL LOW (ref 36.0–46.0)
Hemoglobin: 10 g/dL — ABNORMAL LOW (ref 12.0–15.0)
Immature Granulocytes: 1 %
Lymphocytes Relative: 13 %
Lymphs Abs: 1 10*3/uL (ref 0.7–4.0)
MCH: 26.8 pg (ref 26.0–34.0)
MCHC: 32.2 g/dL (ref 30.0–36.0)
MCV: 83.4 fL (ref 80.0–100.0)
Monocytes Absolute: 0.7 10*3/uL (ref 0.1–1.0)
Monocytes Relative: 10 %
Neutro Abs: 5.3 10*3/uL (ref 1.7–7.7)
Neutrophils Relative %: 75 %
Platelet Count: 248 10*3/uL (ref 150–400)
RBC: 3.73 MIL/uL — ABNORMAL LOW (ref 3.87–5.11)
RDW: 13.6 % (ref 11.5–15.5)
WBC Count: 7.1 10*3/uL (ref 4.0–10.5)
nRBC: 0 % (ref 0.0–0.2)

## 2020-09-07 LAB — CMP (CANCER CENTER ONLY)
ALT: 11 U/L (ref 0–44)
AST: 17 U/L (ref 15–41)
Albumin: 3.3 g/dL — ABNORMAL LOW (ref 3.5–5.0)
Alkaline Phosphatase: 69 U/L (ref 38–126)
Anion gap: 7 (ref 5–15)
BUN: 6 mg/dL — ABNORMAL LOW (ref 8–23)
CO2: 26 mmol/L (ref 22–32)
Calcium: 8.8 mg/dL — ABNORMAL LOW (ref 8.9–10.3)
Chloride: 101 mmol/L (ref 98–111)
Creatinine: 0.76 mg/dL (ref 0.44–1.00)
GFR, Estimated: >60 mL/min (ref >60)
Glucose, Bld: 83 mg/dL (ref 70–99)
Potassium: 3.5 mmol/L (ref 3.5–5.1)
Sodium: 134 mmol/L — ABNORMAL LOW (ref 135–145)
Total Bilirubin: 0.3 mg/dL (ref 0.3–1.2)
Total Protein: 6.4 g/dL — ABNORMAL LOW (ref 6.5–8.1)

## 2020-09-07 LAB — TOTAL PROTEIN, URINE DIPSTICK: Protein, ur: NEGATIVE mg/dL

## 2020-09-07 MED ORDER — SODIUM CHLORIDE 0.9 % IV SOLN
10.0000 mg | Freq: Once | INTRAVENOUS | Status: AC
  Administered 2020-09-07: 10 mg via INTRAVENOUS
  Filled 2020-09-07: qty 10

## 2020-09-07 MED ORDER — BEVACIZUMAB-BVZR CHEMO INJECTION 400 MG/16ML
240.0000 mg | Freq: Once | INTRAVENOUS | Status: AC
Start: 2020-09-07 — End: 2020-09-07
  Administered 2020-09-07: 250 mg via INTRAVENOUS
  Filled 2020-09-07: qty 10

## 2020-09-07 MED ORDER — SODIUM CHLORIDE 0.9 % IV SOLN
2200.0000 mg/m2 | INTRAVENOUS | Status: DC
  Administered 2020-09-07: 3400 mg via INTRAVENOUS
  Filled 2020-09-07: qty 68

## 2020-09-07 MED ORDER — ATROPINE SULFATE 1 MG/ML IJ SOLN
INTRAMUSCULAR | Status: AC
  Filled 2020-09-07: qty 1

## 2020-09-07 MED ORDER — SODIUM CHLORIDE 0.9 % IV SOLN
Freq: Once | INTRAVENOUS | Status: AC
  Filled 2020-09-07: qty 250

## 2020-09-07 MED ORDER — ATROPINE SULFATE 1 MG/ML IJ SOLN
0.5000 mg | Freq: Once | INTRAMUSCULAR | Status: AC | PRN
  Administered 2020-09-07: 0.5 mg via INTRAVENOUS

## 2020-09-07 MED ORDER — SODIUM CHLORIDE 0.9% FLUSH
10.0000 mL | Freq: Once | INTRAVENOUS | Status: AC
  Administered 2020-09-07: 10 mL
  Filled 2020-09-07: qty 10

## 2020-09-07 MED ORDER — PALONOSETRON HCL INJECTION 0.25 MG/5ML
0.2500 mg | Freq: Once | INTRAVENOUS | Status: AC
  Administered 2020-09-07: 0.25 mg via INTRAVENOUS

## 2020-09-07 MED ORDER — SODIUM CHLORIDE 0.9 % IV SOLN
130.0000 mg/m2 | Freq: Once | INTRAVENOUS | Status: AC
  Administered 2020-09-07: 200 mg via INTRAVENOUS
  Filled 2020-09-07: qty 10

## 2020-09-07 MED ORDER — SODIUM CHLORIDE 0.9 % IV SOLN
400.0000 mg/m2 | Freq: Once | INTRAVENOUS | Status: AC
  Administered 2020-09-07: 620 mg via INTRAVENOUS
  Filled 2020-09-07: qty 31

## 2020-09-07 MED ORDER — PALONOSETRON HCL INJECTION 0.25 MG/5ML
INTRAVENOUS | Status: AC
  Filled 2020-09-07: qty 5

## 2020-09-07 NOTE — Patient Instructions (Signed)

## 2020-09-07 NOTE — Progress Notes (Signed)
Nutrition follow-up completed with patient during infusion for rectal cancer. Patient reports diarrhea is much improved after taking Lomotil. Patient has experienced significant weight loss.  Weight documented as 107 pounds March 10 down from 118.8 pounds January 14. Labs were reviewed. Patient reports she has been increasing fiber slowly with good tolerance.  She tries to eat healthy foods and is focused on protein she can tolerate. She has questions about a more organic nutrition drink.  Nutrition diagnosis: Food and nutrition related knowledge deficit continues.  Intervention: Educated on strategies for increasing calories and protein in small amounts throughout the day. Add new foods slowly every 2 days and assess tolerance. Recommend orgain oral nutrition supplements. Answered multiple questions.  Provided additional fact sheets.  Contact information provided. Patient appreciative.  Monitoring, evaluation, goals: Patient will work to increase calories and protein while maintaining adequate bowel regimen to promote weight gain.  Next visit: Thursday, April 7 during infusion.  **Disclaimer: This note was dictated with voice recognition software. Similar sounding words can inadvertently be transcribed and this note may contain transcription errors which may not have been corrected upon publication of note.**

## 2020-09-07 NOTE — Patient Instructions (Signed)
[facility] Discharge Instructions for Patients Receiving Chemotherapy  Today you received the following chemotherapy agents: Zirabev, Irinotecna, Leucovorin, 5FU  To help prevent nausea and vomiting after your treatment, we encourage you to take your nausea medication as directed.    If you develop nausea and vomiting that is not controlled by your nausea medication, call the clinic.   BELOW ARE SYMPTOMS THAT SHOULD BE REPORTED IMMEDIATELY:  *FEVER GREATER THAN 100.5 F  *CHILLS WITH OR WITHOUT FEVER  NAUSEA AND VOMITING THAT IS NOT CONTROLLED WITH YOUR NAUSEA MEDICATION  *UNUSUAL SHORTNESS OF BREATH  *UNUSUAL BRUISING OR BLEEDING  TENDERNESS IN MOUTH AND THROAT WITH OR WITHOUT PRESENCE OF ULCERS  *URINARY PROBLEMS  *BOWEL PROBLEMS  UNUSUAL RASH Items with * indicate a potential emergency and should be followed up as soon as possible.  Feel free to call the clinic should you have any questions or concerns. The clinic phone number is [phone].  Please show the CHEMO ALERT CARD at check-in to the Emergency Department and triage nurse.   

## 2020-09-08 ENCOUNTER — Other Ambulatory Visit: Payer: Self-pay

## 2020-09-08 DIAGNOSIS — C20 Malignant neoplasm of rectum: Secondary | ICD-10-CM

## 2020-09-08 DIAGNOSIS — C787 Secondary malignant neoplasm of liver and intrahepatic bile duct: Secondary | ICD-10-CM

## 2020-09-08 DIAGNOSIS — C182 Malignant neoplasm of ascending colon: Secondary | ICD-10-CM

## 2020-09-09 ENCOUNTER — Other Ambulatory Visit: Payer: Self-pay

## 2020-09-09 ENCOUNTER — Inpatient Hospital Stay: Payer: Federal, State, Local not specified - PPO

## 2020-09-09 VITALS — BP 127/70 | HR 80 | Temp 97.8°F | Resp 18

## 2020-09-09 DIAGNOSIS — C20 Malignant neoplasm of rectum: Secondary | ICD-10-CM

## 2020-09-09 DIAGNOSIS — C787 Secondary malignant neoplasm of liver and intrahepatic bile duct: Secondary | ICD-10-CM

## 2020-09-09 DIAGNOSIS — Z5112 Encounter for antineoplastic immunotherapy: Secondary | ICD-10-CM | POA: Diagnosis not present

## 2020-09-09 MED ORDER — SODIUM CHLORIDE 0.9% FLUSH
10.0000 mL | INTRAVENOUS | Status: DC | PRN
  Administered 2020-09-09: 10 mL
  Filled 2020-09-09: qty 10

## 2020-09-09 MED ORDER — PEGFILGRASTIM-JMDB 6 MG/0.6ML ~~LOC~~ SOSY
6.0000 mg | PREFILLED_SYRINGE | Freq: Once | SUBCUTANEOUS | Status: AC
  Administered 2020-09-09: 6 mg via SUBCUTANEOUS

## 2020-09-09 MED ORDER — HEPARIN SOD (PORK) LOCK FLUSH 100 UNIT/ML IV SOLN
500.0000 [IU] | Freq: Once | INTRAVENOUS | Status: AC | PRN
  Administered 2020-09-09: 500 [IU]
  Filled 2020-09-09: qty 5

## 2020-09-09 NOTE — Progress Notes (Signed)
Pt scheduled to receive 1 L NS with pump D/C.  Pt denies s/s of dehydration.  Pt states she is able to tolerate p.o fluid intake with no issues and states she does not wish to receive additional hydration at this time.  RN educated pt to call the office if signs of dehydration occur.  Pt verbalized understanding and discharged home.

## 2020-09-11 NOTE — Progress Notes (Signed)
No answer for 09/12/20 scheduled phone visit. Patient left an earlier message with my nurse Raynelle Bring that she may not answer due to having a busy day. I left detailed message to call back.   Cira Rue, NP

## 2020-09-12 ENCOUNTER — Inpatient Hospital Stay (HOSPITAL_BASED_OUTPATIENT_CLINIC_OR_DEPARTMENT_OTHER): Payer: Federal, State, Local not specified - PPO | Admitting: Nurse Practitioner

## 2020-09-12 DIAGNOSIS — C20 Malignant neoplasm of rectum: Secondary | ICD-10-CM

## 2020-09-12 DIAGNOSIS — C787 Secondary malignant neoplasm of liver and intrahepatic bile duct: Secondary | ICD-10-CM

## 2020-09-13 ENCOUNTER — Telehealth: Payer: Self-pay | Admitting: Nurse Practitioner

## 2020-09-13 NOTE — Telephone Encounter (Signed)
Checked out appointment. No LOS notes needing to be scheduled. No changes made. 

## 2020-09-19 NOTE — Progress Notes (Signed)
[place]   [phone] [phone]   Clinic Follow up Note   Patient Care Team: Patient, No Pcp Per as PCP - General (General Practice) [name], NP as Nurse Practitioner (Oncology) [name], MD as Consulting Physician (Oncology) [name], RN as Oncology Nurse Navigator 09/20/2020  CHIEF COMPLAINT: Follow up h/o colon cancer and recently diagnosed metastatic rectal cancer   SUMMARY OF ONCOLOGIC HISTORY: Oncology History Overview Note  Cancer Staging Cancer of ascending colon [facility]) Staging form: Colon and Rectum, AJCC 7th Edition - Clinical stage from 02/07/2016: Stage IIIC (T4b, N1b, M0) - Signed by [name], MD on 03/04/2016    Cancer of ascending colon ([place])  10/25/2015 Imaging   CT ABD/PELVIS:  Inflammatory changes inferior to the cecal tip appear improved, there is still irregular soft tissue thickening of the cecal tip, and there are adjacent prominent lymph nodes in the ileocolonic mesentery, measuring 13 mm on image 49 and 8 mm on image 52. In addition, there is a 2.5 x 1.8 cm nodule on image 46 which has central low density. Therefore, these findings are moderately suspicious for an underlying cecal malignancy with perforation.    01/19/2016 Procedure   COLONOSCOPY per Dr. [name]: Fungating, ulcerated mass almost obstructing mid ascending colon   01/19/2016 Initial Biopsy   Diagnosis Surgical [P], cecal mass - INVASIVE ADENOCARCINOMA WITH ULCERATION. - SEE COMMENT.   02/05/2016 Tumor Marker   Patient's tumor was tested for the following markers: CEA Results of the tumor marker test revealed 5.7.   02/07/2016 Initial Diagnosis   Cancer of ascending colon ([place])   02/07/2016 Definitive Surgery   Laparoscopic assisted right hemicolectomy and right salpingo oopherectomy--Dr. [name]   02/07/2016 Pathologic Stage   p T4 N1b   2/43 nodes +   02/07/2016 Pathology Results   MMR normal; G2 adenocarcinoma;proximal & distal  margins negative; soft tissue mass on pelvic sidewall + for adenocarcinoma with positive margin MSI Stable   03/08/2016 Imaging   CT chest negative for metastasis.    03/19/2016 - 04/25/2016 Radiation Therapy   Adjuvant irradiation, 50 gray in 28 fractions   03/19/2016 - 04/22/2016 Chemotherapy   Xeloda 1500 mg twice daily, started on 03/19/2016, dose reduced to 1000 mg twice daily from week 3 due to neutropenia, and patient stopped 3 days before last dose radiation due to difficulty swallowing the pill    05/20/2016 -  Adjuvant Chemotherapy   Patient declined adjuvant chemotherapy   09/16/2016 Imaging   CT CAP w Contrast 1. No evidence of local tumor recurrence at the ileocolic anastomosis. 2. No findings suspicious for metastatic disease in the chest, abdomen or pelvis. 3. Nonspecific trace free fluid in the pelvic cul-de-sac. 4. Stable solitary 3 mm right upper lobe pulmonary nodule, for which 6 month stability has been demonstrated, probably benign. 5. Additional findings include stable right posterior pericardial cyst and small calcified uterine fibroids.   05/13/2017 Imaging   CT CAP W Contrast 05/13/17 IMPRESSION: 1. No current findings of residual or recurrent malignancy. 2. Mild prominence of stool throughout the colon. Nondistended portions of the rectum. 3. Several tiny pulmonary nodules are stable from the earliest available comparison of 03/08/2016 and probably benign, but may merit surveillance. 4. Other imaging findings of potential clinical significance: Old granulomatous disease. Aortoiliac atherosclerotic vascular disease. Lumbar spondylosis and degenerative disc disease. Stable amount of trace free pelvic fluid.   04/27/2018 Imaging   04/27/2018 CT CAP IMPRESSION: Stable exam. No evidence of  recurrent or metastatic carcinoma within the chest, abdomen, or pelvis   04/19/2019 Imaging   CT CAP W Contrast  IMPRESSION: Chest Impression:   1. No evidence  of thoracic metastasis. 2. Stable small bilateral pulmonary nodules.   Abdomen / Pelvis Impression:   1. No evidence local colorectal carcinoma recurrence or metastasis in the abdomen pelvis. 2. Post RIGHT hemicolectomy.   Rectal cancer metastasized to liver ([place])  06/15/2020 Procedure   Screening Colonoscopy by Dr [name]  IMPRESSION - Decreased sphincter tone and internal hemorrhoids that prolapse with straining, but require manual replacement into the anal canal (Grade III) found on digital rectal exam. - Patent side-to-side ileo-colonic anastomosis, characterized by healthy appearing mucosa. - The examined portion of the ileum was normal. - One diminutive polyp in the proximal transverse colon, removed with a cold biopsy forceps. Resected and retrieved. - Likely malignant partially obstructing tumor in the mid rectum. Biopsied. Tattooed. - The examination was otherwise normal on direct and retroflexion views.   06/15/2020 Initial Biopsy   Diagnosis 1. Transverse Colon Polyp - HYPERPLASTIC POLYP 2. Rectum, biopsy - ADENOCARCINOMA ARISING IN A TUBULAR ADENOMA WITH HIGH-GRADE DYSPLASIA. SEE NOTE Diagnosis Note 2. Dr. [name] reviewed the case and concurs with the diagnosis. Dr. [name] was notified on 06/16/2020.   06/28/2020 Imaging   CT CAP  IMPRESSION: 1. New low-density focus in the anterior aspect of the lateral segment LEFT hepatic lobe measuring 1.2 x 1.0 cm, compatible with small metastatic lesion in the LEFT hepatic lobe. 2. Soft tissue in the RIGHT iliac fossa following RIGHT hemicolectomy invades the psoas musculature and is slowly enlarging over time, more linear on the prior study now highly concerning for recurrence/metastasis to this location. 3. Signs of enteritis, potentially post radiation changes of the small bowel. Tethered small bowel in the RIGHT lower quadrant shows focal thickening and narrowing suspicious for small bowel involvement and developing  partial obstruction though currently contrast passes beyond this point into the colon. 4. Rectal thickening in this patient with known rectal mass as described. 5. No evidence of metastatic disease in the chest. 6. Stable small pulmonary nodules. 7.  and aortic atherosclerosis.   Aortic Atherosclerosis (ICD10-I70.0) and Emphysema (ICD10-J43.9).   07/04/2020 Initial Diagnosis   Rectal cancer metastasized to liver ([place])   07/12/2020 PET scan   IMPRESSION: 1. Exam positive for FDG avid rectal tumor which corresponds to the recent colonoscopy findings. 2. FDG avid soft tissue mass within the right iliac fossa is noted and consistent with local tumor recurrence from previous ascending colon tumor. 3. Lateral segment left lobe of liver lesion is FDG avid concerning for liver metastasis. 4. No specific findings identified to suggest metastatic disease to the chest.   08/10/2020 -  Chemotherapy   First-line FOLFIRI q2weeks starting 08/10/20. dose reduced with cycle 1. Irinotecan/5FU increased and Bevacizumab added with cycle 2 on 08/23/2020      CURRENT THERAPY:  First-lineFOLFIRI q2weeks starting2/10/22. dose reduced with cycle 1.Bevacizumab added with cycle 2 on 08/23/2020  INTERVAL HISTORY: Brittney Spencer returns for follow up and treatment as scheduled. She was seen 09/07/20 and completed C3 FOLFIRI and second beva. Irinotecan was dose-reduced.  She is doing better with chemo, diarrhea has improved requiring less medication.  She can usually control this to some degree with her diet.  She has mild nausea mostly from smell aversions, no vomiting.  She is able to eat and drink.  She remains tired but active, she volunteers and takes to college courses.  Mondays are her quite days where she prepares for the week.  She ambulates outside when she can.  Denies rectal pain or bleeding.  She did have some burning and discomfort after bowel movements prior to treatment.  Denies mucositis, fever, chills,  cough, chest pain, dyspnea.   MEDICAL HISTORY:  Past Medical History:  Diagnosis Date  . AAA (abdominal aortic aneurysm) (H[facility])    infrarenal 4.1 cmper s-9-19 scan on chart  . Anemia    hx of  . Anxiety    has PRN meds  . Colon cancer ([place]) 2017   RIGHT hemi colectomy-s/p sx  . GERD (gastroesophageal reflux disease)    OTC meds/diet control  . Hypertension    on meds  . Retinal detachment, right 09/2019  . Vitamin D deficiency     SURGICAL HISTORY: Past Surgical History:  Procedure Laterality Date  . COLONOSCOPY  2018   [facility]  . COLONSCOPY  12/2015  . IR IMAGING GUIDED PORT INSERTION  08/04/2020  . LAPAROSCOPIC RIGHT HEMI COLECTOMY Right 02/07/2016   Procedure: LAPAROSCOPIC ASSISTED RIGHT HEMI COLECTOMY AND RIGHT SALPINGO OOPHERECTOMY;  Surgeon: [name], MD;  Location: [facility];  Service: General;  Laterality: Right;  . RETINAL DETACHMENT SURGERY  09/2019    I have reviewed the social history and family history with the patient and they are unchanged from previous note.  ALLERGIES:  is allergic to fish allergy, peanut-containing drug products, soy allergy, and buspirone.  MEDICATIONS:  Current Outpatient Medications  Medication Sig Dispense Refill  . ALPRAZolam (XANAX) 0.25 MG tablet Take 1 tablet (0.25 mg total) by mouth daily as needed for anxiety. 30 tablet 0  . Cholecalciferol (VITAMIN D3 PO) Take by mouth daily.    . diphenoxylate-atropine (LOMOTIL) 2.5-0.025 MG tablet Take 2 tablets by mouth 4 (four) times daily as needed for diarrhea or loose stools. 45 tablet 3  . docusate sodium (COLACE) 100 MG capsule 1 capsule as needed    . lidocaine-prilocaine (EMLA) cream Apply 1 application topically as needed. 30 g 1  . NORVASC 2.5 MG tablet Take 1 tablet (2.5 mg total) by mouth daily. 30 tablet 3  . ondansetron (ZOFRAN) 8 MG tablet Take 1 tablet (8 mg total) by mouth every 8 (eight) hours as needed for nausea or vomiting. 20 tablet 2  . potassium chloride  (KLOR-CON) 10 MEQ tablet Take 1 tablet (10 mEq total) by mouth daily. 10 tablet 0  . prochlorperazine (COMPAZINE) 10 MG tablet Take 1 tablet (10 mg total) by mouth every 6 (six) hours as needed for nausea or vomiting. 30 tablet 2   Current Facility-Administered Medications  Medication Dose Route Frequency Provider Last Rate Last Admin  . 0.9 %  sodium chloride infusion  500 mL Intravenous Once Doran Stabler, MD       Facility-Administered Medications Ordered in Other Visits  Medication Dose Route Frequency Provider Last [place]  . fluorouracil (ADRUCIL) 3,400 mg in sodium chloride 0.9 % 82 mL chemo infusion  2,200 mg/m2 (Treatment Plan Recorded) Intravenous 1 day or 1 dose [name], MD      . irinotecan (CAMPTOSAR) 200 mg in sodium chloride 0.9 % 500 mL chemo infusion  130 mg/m2 (Treatment Plan Recorded) Intravenous Once [name], MD 340 mL/hr at 09/20/20 1253 200 mg at 09/20/20 1253  . leucovorin 620 mg in sodium chloride 0.9 % 250 mL infusion  400 mg/m2 (Treatment Plan Recorded) Intravenous Once [name], MD 187 mL/hr at 09/20/20 1251 620  mg at 09/20/20 1251    PHYSICAL EXAMINATION: ECOG PERFORMANCE STATUS: 1 - Symptomatic but completely ambulatory  Vitals:   09/20/20 1004  BP: 116/65  Pulse: 73  Resp: 20  Temp: 98.1 F (36.7 C)  SpO2: 100%   Filed Weights   09/20/20 1004  Weight: 105 lb 9.6 oz (47.9 kg)    GENERAL:alert, no distress and comfortable SKIN: No rash EYES:  sclera clear OROPHARYNX: Discolored tongue.  No thrush or ulcers LUNGS: clear normal breathing effort HEART: regular rate & rhythm, no lower extremity edema ABDOMEN:abdomen soft, non-tender and normal bowel sounds NEURO: alert & oriented x 3 with fluent speech, no focal motor/sensory deficits PAC without erythema  LABORATORY DATA:  I have reviewed the data as listed CBC Latest Ref Rng & Units 09/20/2020 09/07/2020 08/30/2020  WBC 4.0 - 10.5 K/uL 7.6 7.1 10.2  Hemoglobin 12.0 - 15.0 g/dL  9.7(L) 10.0(L) 10.2(L)  Hematocrit 36.0 - 46.0 % 30.6(L) 31.1(L) 31.7(L)  Platelets 150 - 400 K/uL 202 248 169     CMP Latest Ref Rng & Units 09/20/2020 09/07/2020 08/30/2020  Glucose 70 - 99 mg/dL 73 83 70  BUN 8 - 23 mg/dL 7(L) 6(L) 10  Creatinine 0.44 - 1.00 mg/dL 0.76 0.76 0.81  Sodium 135 - 145 mmol/L 141 134(L) 136  Potassium 3.5 - 5.1 mmol/L 3.5 3.5 3.5  Chloride 98 - 111 mmol/L 105 101 99  CO2 22 - 32 mmol/L _0 Calcium 8.9 - 10.3 mg/dL 8.6(L) 8.8(L) 8.8(L)  Total Protein 6.5 - 8.1 g/dL 6.2(L) 6.4(L) 6.7  Total Bilirubin 0.3 - 1.2 mg/dL 0.3 0.3 0.4  Alkaline Phos 38 - 126 U/L 74 69 79  AST 15 - 41 U/L 13(L) 17 16  ALT 0 - 44 U/L _1 RADIOGRAPHIC STUDIES: I have personally reviewed the radiological images as listed and agreed with the findings in the report. No results found.   ASSESSMENT & PLAN: Brittney Rocco Flemingis a 70 y.o.femalewith   1. Rectal adenocarcinoma, withliverand right pelvic metastasis, recurrence from previous colon cancer vs new primary -Diagnosed on routine screening colonoscopy12/2021, rectal mass biopsy showed invasive adenocarcinoma, arising from a tubular adenoma -Her 1/12/22PETshowedpositive uptake of known rectal tumor and soft tissue mass within the right iliac fossa indicating recurrent prior colon cancer. There is alsoleft lobe of liver lesion is FDG avid concerning for liver metastasis.pt declined liver biopsy  -whether this is metastatic rectal or recurrent/metastatic colon cancer, this is likely not curable, but still treatable. She previously declined referral for HIPEC -her FO is still pending, will see if she is eligible for EGFR inhibitor -She began first-line systemic chemo with dose-reduced FOLFIRI on2/6/22.  -Bevacizumabadded, and irinotecan/5FU increased with cycle 2, but due to poor tolerance/diarrhea irinotecan was reduced with cycle 3 to 130 mg per metered squared  2. Cancer of ascending colon,  pT4bN1bM0, stage IIIC, MSI-stable, (+) surgical margins at pelvic wall -She was diagnosed in 12/2015. She is s/p right hemicolectomy with rightsalpingo oophorectomyand adjuvant ChemoRT.  -she declined adjuvant chemodue to the concern of side effects and impact on her quality of life. -Her 04/2019 CT scan was NED -With recent rectal cancer diagnosis, herPET from 07/12/20 indicates soft tissue mass within the right iliac fossa is noted and consistent with local tumor recurrence from previous ascendingcolon tumor.  -She declined option of liver biopsy.   3. Mild Anemia  -Has been stable and mild for the past year. -worse lately due to  her newly diagnosed rectal cancerand starting chemo -denies bleeding -monitoring  4. HTN, Anxiety -She'll follow-up with her primary care physician and continue medication -She uses Xanax as needed. -stable, not worse since her recent rectal cancer diagnosis  5. Hypokalemia  -K at 3.3on1/28/22, on oral K -WNL today, continue supplement    Disposition: Ms. Runk appears stable.  She completed 3 cycles of FOLFIRI and bevacizumab.  She is tolerating treatment better with stable fatigue, nausea, and diarrhea.  GI side effects have improved and are well managed with supportive care at home.  There is no evidence of disease progression.  She is able to recover and function well.  Labs reviewed, adequate to proceed with cycle 4 FOLFIRI/bevacizumab today as planned at same dose.  She is scheduled for IV fluids with pump DC, may cancel if she is feeling well that day.  CEA downtrending, she is likely responding to treatment.  The plan is to restage after cycle 6 at the end of April, she wants a treatment break in May to graduate among other personal things.  Follow-up in 2 weeks with cycles 5.  Orders Placed This Encounter  Procedures  . CT CHEST ABDOMEN PELVIS W CONTRAST    Standing Status:   Future    Standing Expiration Date:   09/20/2021     Order Specific Question:   If indicated for the ordered procedure, I authorize the administration of contrast media per Radiology protocol    Answer:   Yes    Order Specific Question:   Preferred imaging location?    Answer:   [facility]    Order Specific Question:   Is Oral Contrast requested for this exam?    Answer:   Yes, Per Radiology protocol    Order Specific Question:   Reason for Exam (SYMPTOM  OR DIAGNOSIS REQUIRED)    Answer:   h/o colon cancer and new metastatic rectal cancer, restage on first line chemo    All questions were answered. The patient knows to call the clinic with any problems, questions or concerns. No barriers to learning were detected.     [name], NP 09/20/20

## 2020-09-20 ENCOUNTER — Inpatient Hospital Stay: Payer: Federal, State, Local not specified - PPO

## 2020-09-20 ENCOUNTER — Inpatient Hospital Stay: Payer: Federal, State, Local not specified - PPO | Admitting: Nurse Practitioner

## 2020-09-20 ENCOUNTER — Encounter: Payer: Self-pay | Admitting: Nurse Practitioner

## 2020-09-20 ENCOUNTER — Other Ambulatory Visit: Payer: Self-pay

## 2020-09-20 VITALS — BP 116/65 | HR 73 | Temp 98.1°F | Resp 20 | Ht 63.0 in | Wt 105.6 lb

## 2020-09-20 DIAGNOSIS — Z95828 Presence of other vascular implants and grafts: Secondary | ICD-10-CM

## 2020-09-20 DIAGNOSIS — C787 Secondary malignant neoplasm of liver and intrahepatic bile duct: Secondary | ICD-10-CM

## 2020-09-20 DIAGNOSIS — C20 Malignant neoplasm of rectum: Secondary | ICD-10-CM

## 2020-09-20 DIAGNOSIS — Z5112 Encounter for antineoplastic immunotherapy: Secondary | ICD-10-CM | POA: Diagnosis not present

## 2020-09-20 DIAGNOSIS — C182 Malignant neoplasm of ascending colon: Secondary | ICD-10-CM

## 2020-09-20 LAB — CBC WITH DIFFERENTIAL (CANCER CENTER ONLY)
Abs Immature Granulocytes: 0.08 10*3/uL — ABNORMAL HIGH (ref 0.00–0.07)
Basophils Absolute: 0 10*3/uL (ref 0.0–0.1)
Basophils Relative: 0 %
Eosinophils Absolute: 0.1 10*3/uL (ref 0.0–0.5)
Eosinophils Relative: 1 %
HCT: 30.6 % — ABNORMAL LOW (ref 36.0–46.0)
Hemoglobin: 9.7 g/dL — ABNORMAL LOW (ref 12.0–15.0)
Immature Granulocytes: 1 %
Lymphocytes Relative: 12 %
Lymphs Abs: 0.9 10*3/uL (ref 0.7–4.0)
MCH: 26.7 pg (ref 26.0–34.0)
MCHC: 31.7 g/dL (ref 30.0–36.0)
MCV: 84.3 fL (ref 80.0–100.0)
Monocytes Absolute: 0.5 10*3/uL (ref 0.1–1.0)
Monocytes Relative: 7 %
Neutro Abs: 6 10*3/uL (ref 1.7–7.7)
Neutrophils Relative %: 79 %
Platelet Count: 202 10*3/uL (ref 150–400)
RBC: 3.63 MIL/uL — ABNORMAL LOW (ref 3.87–5.11)
RDW: 14.4 % (ref 11.5–15.5)
WBC Count: 7.6 10*3/uL (ref 4.0–10.5)
nRBC: 0 % (ref 0.0–0.2)

## 2020-09-20 LAB — CMP (CANCER CENTER ONLY)
ALT: 10 U/L (ref 0–44)
AST: 13 U/L — ABNORMAL LOW (ref 15–41)
Albumin: 3.3 g/dL — ABNORMAL LOW (ref 3.5–5.0)
Alkaline Phosphatase: 74 U/L (ref 38–126)
Anion gap: 11 (ref 5–15)
BUN: 7 mg/dL — ABNORMAL LOW (ref 8–23)
CO2: 25 mmol/L (ref 22–32)
Calcium: 8.6 mg/dL — ABNORMAL LOW (ref 8.9–10.3)
Chloride: 105 mmol/L (ref 98–111)
Creatinine: 0.76 mg/dL (ref 0.44–1.00)
GFR, Estimated: >60 mL/min (ref >60)
Glucose, Bld: 73 mg/dL (ref 70–99)
Potassium: 3.5 mmol/L (ref 3.5–5.1)
Sodium: 141 mmol/L (ref 135–145)
Total Bilirubin: 0.3 mg/dL (ref 0.3–1.2)
Total Protein: 6.2 g/dL — ABNORMAL LOW (ref 6.5–8.1)

## 2020-09-20 LAB — TOTAL PROTEIN, URINE DIPSTICK: Protein, ur: NEGATIVE mg/dL

## 2020-09-20 LAB — CEA (IN HOUSE-CHCC): CEA (CHCC-In House): 5.47 ng/mL — ABNORMAL HIGH (ref 0.00–5.00)

## 2020-09-20 MED ORDER — SODIUM CHLORIDE 0.9 % IV SOLN
Freq: Once | INTRAVENOUS | Status: AC
  Filled 2020-09-20: qty 250

## 2020-09-20 MED ORDER — PALONOSETRON HCL INJECTION 0.25 MG/5ML
0.2500 mg | Freq: Once | INTRAVENOUS | Status: AC
  Administered 2020-09-20: 0.25 mg via INTRAVENOUS

## 2020-09-20 MED ORDER — SODIUM CHLORIDE 0.9 % IV SOLN
2200.0000 mg/m2 | INTRAVENOUS | Status: DC
  Administered 2020-09-20: 3400 mg via INTRAVENOUS
  Filled 2020-09-20: qty 68

## 2020-09-20 MED ORDER — SODIUM CHLORIDE 0.9 % IV SOLN
400.0000 mg/m2 | Freq: Once | INTRAVENOUS | Status: AC
  Administered 2020-09-20: 620 mg via INTRAVENOUS
  Filled 2020-09-20: qty 31

## 2020-09-20 MED ORDER — ATROPINE SULFATE 1 MG/ML IJ SOLN
0.5000 mg | Freq: Once | INTRAMUSCULAR | Status: AC | PRN
  Administered 2020-09-20: 0.5 mg via INTRAVENOUS

## 2020-09-20 MED ORDER — SODIUM CHLORIDE 0.9% FLUSH
10.0000 mL | Freq: Once | INTRAVENOUS | Status: AC
  Administered 2020-09-20: 10 mL
  Filled 2020-09-20: qty 10

## 2020-09-20 MED ORDER — PALONOSETRON HCL INJECTION 0.25 MG/5ML
INTRAVENOUS | Status: AC
  Filled 2020-09-20: qty 5

## 2020-09-20 MED ORDER — SODIUM CHLORIDE 0.9 % IV SOLN
10.0000 mg | Freq: Once | INTRAVENOUS | Status: AC
  Administered 2020-09-20: 10 mg via INTRAVENOUS
  Filled 2020-09-20: qty 10

## 2020-09-20 MED ORDER — SODIUM CHLORIDE 0.9 % IV SOLN
250.0000 mg | Freq: Once | INTRAVENOUS | Status: AC
  Administered 2020-09-20: 250 mg via INTRAVENOUS
  Filled 2020-09-20: qty 10

## 2020-09-20 MED ORDER — ATROPINE SULFATE 1 MG/ML IJ SOLN
INTRAMUSCULAR | Status: AC
  Filled 2020-09-20: qty 1

## 2020-09-20 MED ORDER — SODIUM CHLORIDE 0.9 % IV SOLN
130.0000 mg/m2 | Freq: Once | INTRAVENOUS | Status: AC
  Administered 2020-09-20: 200 mg via INTRAVENOUS
  Filled 2020-09-20: qty 10

## 2020-09-20 NOTE — Patient Instructions (Signed)
[place] Discharge Instructions for Patients Receiving Chemotherapy  Today you received the following chemotherapy agents: Zirabev, Irinotecna, Leucovorin, 5FU  To help prevent nausea and vomiting after your treatment, we encourage you to take your nausea medication as directed.    If you develop nausea and vomiting that is not controlled by your nausea medication, call the clinic.   BELOW ARE SYMPTOMS THAT SHOULD BE REPORTED IMMEDIATELY:  *FEVER GREATER THAN 100.5 F  *CHILLS WITH OR WITHOUT FEVER  NAUSEA AND VOMITING THAT IS NOT CONTROLLED WITH YOUR NAUSEA MEDICATION  *UNUSUAL SHORTNESS OF BREATH  *UNUSUAL BRUISING OR BLEEDING  TENDERNESS IN MOUTH AND THROAT WITH OR WITHOUT PRESENCE OF ULCERS  *URINARY PROBLEMS  *BOWEL PROBLEMS  UNUSUAL RASH Items with * indicate a potential emergency and should be followed up as soon as possible.  Feel free to call the clinic should you have any questions or concerns. The clinic phone number is [phone].  Please show the [place] at check-in to the Emergency Department and triage nurse.

## 2020-09-21 ENCOUNTER — Telehealth: Payer: Self-pay | Admitting: Hematology

## 2020-09-21 NOTE — Telephone Encounter (Signed)
Scheduled follow-up appointments per 3/23 los. Patient is aware.

## 2020-09-22 ENCOUNTER — Other Ambulatory Visit: Payer: Self-pay

## 2020-09-22 ENCOUNTER — Inpatient Hospital Stay: Payer: Federal, State, Local not specified - PPO

## 2020-09-22 DIAGNOSIS — C787 Secondary malignant neoplasm of liver and intrahepatic bile duct: Secondary | ICD-10-CM

## 2020-09-22 DIAGNOSIS — Z5112 Encounter for antineoplastic immunotherapy: Secondary | ICD-10-CM | POA: Diagnosis not present

## 2020-09-22 DIAGNOSIS — C20 Malignant neoplasm of rectum: Secondary | ICD-10-CM

## 2020-09-22 MED ORDER — HEPARIN SOD (PORK) LOCK FLUSH 100 UNIT/ML IV SOLN
500.0000 [IU] | Freq: Once | INTRAVENOUS | Status: AC | PRN
  Administered 2020-09-22: 500 [IU]
  Filled 2020-09-22: qty 5

## 2020-09-22 MED ORDER — SODIUM CHLORIDE 0.9% FLUSH
10.0000 mL | INTRAVENOUS | Status: DC | PRN
  Administered 2020-09-22: 10 mL
  Filled 2020-09-22: qty 10

## 2020-09-22 MED ORDER — PEGFILGRASTIM-JMDB 6 MG/0.6ML ~~LOC~~ SOSY
6.0000 mg | PREFILLED_SYRINGE | Freq: Once | SUBCUTANEOUS | Status: AC
  Administered 2020-09-22: 6 mg via SUBCUTANEOUS

## 2020-09-26 ENCOUNTER — Other Ambulatory Visit: Payer: Self-pay

## 2020-09-26 ENCOUNTER — Ambulatory Visit (INDEPENDENT_AMBULATORY_CARE_PROVIDER_SITE_OTHER): Payer: Federal, State, Local not specified - PPO | Admitting: Ophthalmology

## 2020-09-26 ENCOUNTER — Encounter (INDEPENDENT_AMBULATORY_CARE_PROVIDER_SITE_OTHER): Payer: Self-pay | Admitting: Ophthalmology

## 2020-09-26 DIAGNOSIS — H43821 Vitreomacular adhesion, right eye: Secondary | ICD-10-CM | POA: Diagnosis not present

## 2020-09-26 DIAGNOSIS — H43822 Vitreomacular adhesion, left eye: Secondary | ICD-10-CM

## 2020-09-26 DIAGNOSIS — H2512 Age-related nuclear cataract, left eye: Secondary | ICD-10-CM

## 2020-09-26 NOTE — Assessment & Plan Note (Signed)
Mild NSC not visually impactful at this time

## 2020-09-26 NOTE — Progress Notes (Signed)
09/26/2020     CHIEF COMPLAINT Patient presents for Retina Follow Up (6 Mo F/U OU//Pt sts she is currently back on chemo therapy since 12/21. Pt sts she is unsure if chemo is affecting her [facility]. Pt denies ocular pain, flashes, or floaters OU.)   HISTORY OF PRESENT ILLNESS: Brittney Spencer is a 70 y.o. female who presents to the clinic today for:   HPI    Retina Follow Up    Patient presents with  Other.  In both eyes.  This started 6 months ago.  Severity is mild.  Duration of 6 months.  Since onset it is stable. Additional comments: 6 Mo F/U OU  Pt sts she is currently back on chemo therapy since 12/21. Pt sts she is unsure if chemo is affecting her [facility]. Pt denies ocular pain, flashes, or floaters OU.       Last edited by [name], [place] on 09/26/2020  1:01 PM. (History)      Referring physician: Leighton Ruff, MD [place],  [place] [id number]  HISTORICAL INFORMATION:   Selected notes from the MEDICAL RECORD NUMBER       CURRENT MEDICATIONS: No current outpatient medications on file. (Ophthalmic Drugs)   No current facility-administered medications for this visit. (Ophthalmic Drugs)   Current Outpatient Medications (Other)  Medication Sig  . ALPRAZolam (XANAX) 0.25 MG tablet Take 1 tablet (0.25 mg total) by mouth daily as needed for anxiety.  . Cholecalciferol (VITAMIN D3 PO) Take by mouth daily.  . diphenoxylate-atropine (LOMOTIL) 2.5-0.025 MG tablet Take 2 tablets by mouth 4 (four) times daily as needed for diarrhea or loose stools.  . docusate sodium (COLACE) 100 MG capsule 1 capsule as needed  . lidocaine-prilocaine (EMLA) cream Apply 1 application topically as needed.  . NORVASC 2.5 MG tablet Take 1 tablet (2.5 mg total) by mouth daily.  . ondansetron (ZOFRAN) 8 MG tablet Take 1 tablet (8 mg total) by mouth every 8 (eight) hours as needed for nausea or vomiting.  . potassium chloride (KLOR-CON) 10 MEQ tablet Take 1 tablet (10 mEq total) by  mouth daily.  . prochlorperazine (COMPAZINE) 10 MG tablet Take 1 tablet (10 mg total) by mouth every 6 (six) hours as needed for nausea or vomiting.   Current Facility-Administered Medications (Other)  Medication Route  . 0.9 %  sodium chloride infusion Intravenous      REVIEW OF SYSTEMS:    ALLERGIES Allergies  Allergen Reactions  . Fish Allergy Itching and Swelling  . Peanut-Containing Drug Products     Itching throat  . Soy Allergy Other (See Comments)    Stomach aches  . Buspirone Anxiety    PAST MEDICAL HISTORY Past Medical History:  Diagnosis Date  . AAA (abdominal aortic aneurysm) (HCC)    infrarenal 4.1 cmper s-9-19 scan on chart  . Anemia    hx of  . Anxiety    has PRN meds  . Colon cancer ([facility]) 2017   RIGHT hemi colectomy-s/p sx  . GERD (gastroesophageal reflux disease)    OTC meds/diet control  . Hypertension    on meds  . Macular pucker, right eye 10/06/2019  . Retinal detachment, right 09/2019  . Vitamin D deficiency    Past Surgical History:  Procedure Laterality Date  . COLONOSCOPY  2018   [facility]-hams  . COLONSCOPY  12/2015  . IR IMAGING GUIDED PORT INSERTION  08/04/2020  . LAPAROSCOPIC RIGHT HEMI COLECTOMY Right 02/07/2016   Procedure: LAPAROSCOPIC ASSISTED  RIGHT HEMI COLECTOMY AND RIGHT SALPINGO OOPHERECTOMY;  Surgeon: [name], MD;  Location: [facility];  Service: General;  Laterality: Right;  . RETINAL DETACHMENT SURGERY  09/2019    FAMILY HISTORY Family History  Problem Relation Age of Onset  . Cancer Mother        lung cancer  . Cancer Maternal Grandfather        unknown cancer   . Colon cancer Neg Hx   . Esophageal cancer Neg Hx   . Rectal cancer Neg Hx   . Stomach cancer Neg Hx   . Colon polyps Neg Hx     SOCIAL HISTORY Social History   Tobacco Use  . Smoking status: Never Smoker  . Smokeless tobacco: Never Used  Vaping Use  . Vaping Use: Never used  Substance Use Topics  . Alcohol use: No    Alcohol/week: 0.0 standard  drinks  . Drug use: No         OPHTHALMIC EXAM: Base Eye Exam    Visual Acuity (ETDRS)      Right Left   Dist cc 20/40 +1 20/25 -2   Dist ph cc 20/30ecc +1    Correction: Glasses       Tonometry (Tonopen, 1:02 PM)      Right Left   Pressure 10 13       Pupils      Pupils Dark Light Shape React APD   Right PERRL 5 4 Round Brisk None   Left PERRL 5 4 Round Brisk None       Visual Fields (Counting fingers)      Left Right    Full Full       Extraocular Movement      Right Left    Full Full       Neuro/Psych    Oriented x3: Yes   Mood/Affect: Normal       Dilation    Both eyes: 1.0% Mydriacyl, 2.5% Phenylephrine @ 1:07 PM        Slit Lamp and Fundus Exam    External Exam      Right Left   External Normal Normal       Slit Lamp Exam      Right Left   Lids/Lashes Normal Normal   Conjunctiva/Sclera White and quiet White and quiet   Cornea Clear Clear   Anterior Chamber Deep and quiet Deep and quiet   Iris Round and reactive Round and reactive   Lens Posterior chamber intraocular lens 2+ Nuclear sclerosis   Anterior Vitreous Normal Normal       Fundus Exam      Right Left   Posterior Vitreous Vitrectomized Normal   Disc Normal Normal   C/D Ratio 0.2 0.25   Macula Normal,, macular region is now flat, no holes or tears noted,, shiny ILM, no striae. Normal   Vessels Normal Normal   Periphery Normal Normal          IMAGING AND PROCEDURES  Imaging and Procedures for 09/26/20  OCT, Retina - OU - Both Eyes       Right Eye Quality was good. Scan locations included subfoveal. Central Foveal Thickness: 236. Progression has improved.   Left Eye Quality was good. Central Foveal Thickness: 292. Progression has been stable. Findings include epiretinal membrane, vitreomacular adhesion .                 ASSESSMENT/PLAN:  Nuclear sclerotic cataract of left eye Mild NSC not visually impactful  at this time  Vitreomacular traction,  left Minor with no foveal distortion will observe      ICD-10-CM   1. 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 Nuclear sclerotic cataract of left eye  H25.12   3. Vitreomacular traction, left  H43.822     1.  OS with no interval change with minor epiretinal membrane in vitreomacular adhesion.  Will observe since there is no visual acuity impact nor distortion to the foveal region  2.  Mild cataract left eye observe  3.  Ophthalmic Meds Ordered this visit:  No orders of the defined types were placed in this encounter.      Return in about 1 year (around 09/26/2021) for DILATE OU, OCT.  There are no Patient Instructions on file for this visit.   Explained the diagnoses, plan, and follow up with the patient and they expressed understanding.  Patient expressed understanding of the importance of proper follow up care.   Clent Demark Esperanza Madrazo M.D. Diseases & Surgery of the Retina and Vitreous [facility] [place] 09/26/20     Abbreviations: M myopia (nearsighted); A astigmatism; H hyperopia (farsighted); P presbyopia; Mrx spectacle prescription;  CTL contact lenses; OD right eye; OS left eye; OU both eyes  XT exotropia; ET esotropia; PEK punctate epithelial keratitis; PEE punctate epithelial erosions; DES dry eye syndrome; MGD meibomian gland dysfunction; ATs artificial tears; PFAT's preservative free artificial tears; [place] nuclear sclerotic cataract; PSC posterior subcapsular cataract; ERM epi-retinal membrane; PVD posterior vitreous detachment; RD retinal detachment; DM diabetes mellitus; DR diabetic retinopathy; NPDR non-proliferative diabetic retinopathy; PDR proliferative diabetic retinopathy; CSME clinically significant macular edema; DME diabetic macular edema; dbh dot blot hemorrhages; CWS cotton wool spot; POAG primary open angle glaucoma; C/D cup-to-disc ratio; HVF humphrey visual field; GVF goldmann visual field; OCT optical coherence  tomography; IOP intraocular pressure; BRVO Branch retinal vein occlusion; CRVO central retinal vein occlusion; CRAO central retinal artery occlusion; BRAO branch retinal artery occlusion; RT retinal tear; SB scleral buckle; PPV pars plana vitrectomy; VH Vitreous hemorrhage; PRP panretinal laser photocoagulation; IVK intravitreal kenalog; VMT vitreomacular traction; MH Macular hole;  NVD neovascularization of the disc; NVE neovascularization elsewhere; AREDS age related eye disease study; ARMD age related macular degeneration; POAG primary open angle glaucoma; EBMD epithelial/anterior basement membrane dystrophy; ACIOL anterior chamber intraocular lens; IOL intraocular lens; PCIOL posterior chamber intraocular lens; Phaco/IOL phacoemulsification with intraocular lens placement; [place] photorefractive keratectomy; LASIK laser assisted in situ keratomileusis; HTN hypertension; DM diabetes mellitus; COPD chronic obstructive pulmonary disease

## 2020-09-26 NOTE — Assessment & Plan Note (Signed)
Minor with no foveal distortion will observe

## 2020-09-28 NOTE — Progress Notes (Signed)
[place]   [phone] [phone]   Clinic Follow up Note   Patient Care Team: Patient, No Pcp Per (Inactive) as PCP - General (General Practice) [name], NP as Nurse Practitioner (Oncology) [name], MD as Consulting Physician (Oncology) [name], RN as Oncology Nurse Navigator  Date of Service:  10/05/2020  CHIEF COMPLAINT: Follow up h/o colon cancer and recently diagnosed metastatic rectal cancer  SUMMARY OF ONCOLOGIC HISTORY: Oncology History Overview Note  Cancer Staging Cancer of ascending colon [facility]) Staging form: Colon and Rectum, AJCC 7th Edition - Clinical stage from 02/07/2016: Stage IIIC (T4b, N1b, M0) - Signed by [name], MD on 03/04/2016    Cancer of ascending colon ([place])  10/25/2015 Imaging   CT ABD/PELVIS:  Inflammatory changes inferior to the cecal tip appear improved, there is still irregular soft tissue thickening of the cecal tip, and there are adjacent prominent lymph nodes in the ileocolonic mesentery, measuring 13 mm on image 49 and 8 mm on image 52. In addition, there is a 2.5 x 1.8 cm nodule on image 46 which has central low density. Therefore, these findings are moderately suspicious for an underlying cecal malignancy with perforation.    01/19/2016 Procedure   COLONOSCOPY per Dr. [name]: Fungating, ulcerated mass almost obstructing mid ascending colon   01/19/2016 Initial Biopsy   Diagnosis Surgical [P], cecal mass - INVASIVE ADENOCARCINOMA WITH ULCERATION. - SEE COMMENT.   02/05/2016 Tumor Marker   Patient's tumor was tested for the following markers: CEA Results of the tumor marker test revealed 5.7.   02/07/2016 Initial Diagnosis   Cancer of ascending colon ([place])   02/07/2016 Definitive Surgery   Laparoscopic assisted right hemicolectomy and right salpingo oopherectomy--Dr. [name]   02/07/2016 Pathologic Stage   p T4 N1b   2/43 nodes +   02/07/2016 Pathology Results   MMR normal; G2  adenocarcinoma;proximal & distal margins negative; soft tissue mass on pelvic sidewall + for adenocarcinoma with positive margin MSI Stable   03/08/2016 Imaging   CT chest negative for metastasis.    03/19/2016 - 04/25/2016 Radiation Therapy   Adjuvant irradiation, 50 gray in 28 fractions   03/19/2016 - 04/22/2016 Chemotherapy   Xeloda 1500 mg twice daily, started on 03/19/2016, dose reduced to 1000 mg twice daily from week 3 due to neutropenia, and patient stopped 3 days before last dose radiation due to difficulty swallowing the pill    05/20/2016 -  Adjuvant Chemotherapy   Patient declined adjuvant chemotherapy   09/16/2016 Imaging   CT CAP w Contrast 1. No evidence of local tumor recurrence at the ileocolic anastomosis. 2. No findings suspicious for metastatic disease in the chest, abdomen or pelvis. 3. Nonspecific trace free fluid in the pelvic cul-de-sac. 4. Stable solitary 3 mm right upper lobe pulmonary nodule, for which 6 month stability has been demonstrated, probably benign. 5. Additional findings include stable right posterior pericardial cyst and small calcified uterine fibroids.   05/13/2017 Imaging   CT CAP W Contrast 05/13/17 IMPRESSION: 1. No current findings of residual or recurrent malignancy. 2. Mild prominence of stool throughout the colon. Nondistended portions of the rectum. 3. Several tiny pulmonary nodules are stable from the earliest available comparison of 03/08/2016 and probably benign, but may merit surveillance. 4. Other imaging findings of potential clinical significance: Old granulomatous disease. Aortoiliac atherosclerotic vascular disease. Lumbar spondylosis and degenerative disc disease. Stable amount of trace free pelvic fluid.   04/27/2018 Imaging   04/27/2018 CT CAP IMPRESSION:  Stable exam. No evidence of recurrent or metastatic carcinoma within the chest, abdomen, or pelvis   04/19/2019 Imaging   CT CAP W Contrast  IMPRESSION: Chest  Impression:   1. No evidence of thoracic metastasis. 2. Stable small bilateral pulmonary nodules.   Abdomen / Pelvis Impression:   1. No evidence local colorectal carcinoma recurrence or metastasis in the abdomen pelvis. 2. Post RIGHT hemicolectomy.   Rectal cancer metastasized to liver ([place])  06/15/2020 Procedure   Screening Colonoscopy by Dr [name]  IMPRESSION - Decreased sphincter tone and internal hemorrhoids that prolapse with straining, but require manual replacement into the anal canal (Grade III) found on digital rectal exam. - Patent side-to-side ileo-colonic anastomosis, characterized by healthy appearing mucosa. - The examined portion of the ileum was normal. - One diminutive polyp in the proximal transverse colon, removed with a cold biopsy forceps. Resected and retrieved. - Likely malignant partially obstructing tumor in the mid rectum. Biopsied. Tattooed. - The examination was otherwise normal on direct and retroflexion views.   06/15/2020 Initial Biopsy   Diagnosis 1. Transverse Colon Polyp - HYPERPLASTIC POLYP 2. Rectum, biopsy - ADENOCARCINOMA ARISING IN A TUBULAR ADENOMA WITH HIGH-GRADE DYSPLASIA. SEE NOTE Diagnosis Note 2. Dr. [name] reviewed the case and concurs with the diagnosis. Dr. [name] was notified on 06/16/2020.   06/28/2020 Imaging   CT CAP  IMPRESSION: 1. New low-density focus in the anterior aspect of the lateral segment LEFT hepatic lobe measuring 1.2 x 1.0 cm, compatible with small metastatic lesion in the LEFT hepatic lobe. 2. Soft tissue in the RIGHT iliac fossa following RIGHT hemicolectomy invades the psoas musculature and is slowly enlarging over time, more linear on the prior study now highly concerning for recurrence/metastasis to this location. 3. Signs of enteritis, potentially post radiation changes of the small bowel. Tethered small bowel in the RIGHT lower quadrant shows focal thickening and narrowing suspicious for small  bowel involvement and developing partial obstruction though currently contrast passes beyond this point into the colon. 4. Rectal thickening in this patient with known rectal mass as described. 5. No evidence of metastatic disease in the chest. 6. Stable small pulmonary nodules. 7.  and aortic atherosclerosis.   Aortic Atherosclerosis (ICD10-I70.0) and Emphysema (ICD10-J43.9).   07/04/2020 Initial Diagnosis   Rectal cancer metastasized to liver ([place])   07/12/2020 PET scan   IMPRESSION: 1. Exam positive for FDG avid rectal tumor which corresponds to the recent colonoscopy findings. 2. FDG avid soft tissue mass within the right iliac fossa is noted and consistent with local tumor recurrence from previous ascending colon tumor. 3. Lateral segment left lobe of liver lesion is FDG avid concerning for liver metastasis. 4. No specific findings identified to suggest metastatic disease to the chest.   08/10/2020 -  Chemotherapy   First-line FOLFIRI q2weeks starting 08/10/20. dose reduced with cycle 1. Irinotecan/5FU increased and Bevacizumab added with cycle 2 on 08/23/2020      CURRENT THERAPY:  First-lineFOLFIRI q2weeks starting2/10/22.dose reduced with cycle 1.Bevacizumab added with cycle 2 on 08/23/2020  INTERVAL HISTORY:  Brittney Spencer is here for a follow up of metastatic rectal cancer. She was last seen by me on 09/07/20, and by NP Lacie on 09/12/20 and 09/20/20 in the interim. She reports less diarrhea, stools are now soft. She notes she is not having a bowel movement every day. She has been eating bananas and drinking lots of water. She notes she is not eating as many sweets, with the exception of ice cream. She  reports her appetite dropped following treatment, but she slowly began to eat more. She notes she is getting up and getting out, staying active. She denies any hair loss. She also notes a change in color of the mucosa in her mouth. She notes she saw her dentist, who noted  no concerns with dentition. She also notes she saw her ophthalmologist for follow up of her retinas. She states this went well, and she will follow up in a year.  She notes she is graduating this May.   REVIEW OF SYSTEMS:   Constitutional: Denies fevers, chills or abnormal weight loss Eyes: Denies blurriness of vision Ears, nose, mouth, throat, and face: Denies mucositis or sore throat Respiratory: Denies cough, dyspnea or wheezes Cardiovascular: Denies palpitation, chest discomfort or lower extremity swelling Gastrointestinal:  Denies nausea, heartburn or change in bowel habits Skin: Denies abnormal skin rashes Lymphatics: Denies new lymphadenopathy or easy bruising Neurological:Denies numbness, tingling or new weaknesses Behavioral/Psych: Mood is stable, no new changes  All other systems were reviewed with the patient and are negative.  MEDICAL HISTORY:  Past Medical History:  Diagnosis Date  . AAA (abdominal aortic aneurysm) ([facility])    infrarenal 4.1 cmper s-9-19 scan on chart  . Anemia    hx of  . Anxiety    has PRN meds  . Colon cancer ([place]) 2017   RIGHT hemi colectomy-s/p sx  . GERD (gastroesophageal reflux disease)    OTC meds/diet control  . Hypertension    on meds  . Macular pucker, right eye 10/06/2019  . Retinal detachment, right 09/2019  . Vitamin D deficiency     SURGICAL HISTORY: Past Surgical History:  Procedure Laterality Date  . COLONOSCOPY  2018   HD-hams  . COLONSCOPY  12/2015  . IR IMAGING GUIDED PORT INSERTION  08/04/2020  . LAPAROSCOPIC RIGHT HEMI COLECTOMY Right 02/07/2016   Procedure: LAPAROSCOPIC ASSISTED RIGHT HEMI COLECTOMY AND RIGHT SALPINGO OOPHERECTOMY;  Surgeon: [name], MD;  Location: [facility];  Service: General;  Laterality: Right;  . RETINAL DETACHMENT SURGERY  09/2019    I have reviewed the social history and family history with the patient and they are unchanged from previous note.  ALLERGIES:  is allergic to fish allergy,  peanut-containing drug products, soy allergy, and buspirone.  MEDICATIONS:  Current Outpatient Medications  Medication Sig Dispense Refill  . ALPRAZolam (XANAX) 0.25 MG tablet Take 1 tablet (0.25 mg total) by mouth daily as needed for anxiety. 30 tablet 0  . Cholecalciferol (VITAMIN D3 PO) Take by mouth daily.    . diphenoxylate-atropine (LOMOTIL) 2.5-0.025 MG tablet Take 2 tablets by mouth 4 (four) times daily as needed for diarrhea or loose stools. 45 tablet 3  . docusate sodium (COLACE) 100 MG capsule 1 capsule as needed    . lidocaine-prilocaine (EMLA) cream Apply 1 application topically as needed. 30 g 1  . NORVASC 2.5 MG tablet Take 1 tablet (2.5 mg total) by mouth daily. 30 tablet 3  . ondansetron (ZOFRAN) 8 MG tablet Take 1 tablet (8 mg total) by mouth every 8 (eight) hours as needed for nausea or vomiting. 20 tablet 2  . potassium chloride (KLOR-CON) 10 MEQ tablet Take 1 tablet (10 mEq total) by mouth daily. 10 tablet 0  . prochlorperazine (COMPAZINE) 10 MG tablet Take 1 tablet (10 mg total) by mouth every 6 (six) hours as needed for nausea or vomiting. 30 tablet 2   Current Facility-Administered Medications  Medication Dose Route Frequency Provider Last Rate Last Admin  .  0.9 %  sodium chloride infusion  500 mL Intravenous Once Nelida Meuse III, MD        PHYSICAL EXAMINATION: ECOG PERFORMANCE STATUS: 1 - Symptomatic but completely ambulatory  Vitals:   10/05/20 1006  BP: 106/72  Pulse: 82  Resp: 18  Temp: (!) 97.5 F (36.4 C)  SpO2: 100%   Filed Weights   10/05/20 1006  Weight: 103 lb 11.2 oz (47 kg)    Due to COVID19 we will limit examination to appearance. Patient had no complaints.  GENERAL:alert, no distress and comfortable SKIN: skin color normal, no rashes or significant lesions EYES: normal, Conjunctiva are pink and non-injected, sclera clear  NEURO: alert & oriented x 3 with fluent speech  LABORATORY DATA:  I have reviewed the data as listed CBC  Latest Ref Rng & Units 10/05/2020 09/20/2020 09/07/2020  WBC 4.0 - 10.5 K/uL 10.1 7.6 7.1  Hemoglobin 12.0 - 15.0 g/dL 9.6(L) 9.7(L) 10.0(L)  Hematocrit 36.0 - 46.0 % 30.1(L) 30.6(L) 31.1(L)  Platelets 150 - 400 K/uL 174 202 248     CMP Latest Ref Rng & Units 10/05/2020 09/20/2020 09/07/2020  Glucose 70 - 99 mg/dL 85 73 83  BUN 8 - 23 mg/dL 9 7(L) 6(L)  Creatinine 0.44 - 1.00 mg/dL 0.79 0.76 0.76  Sodium 135 - 145 mmol/L 139 141 134(L)  Potassium 3.5 - 5.1 mmol/L 3.8 3.5 3.5  Chloride 98 - 111 mmol/L 104 105 101  CO2 22 - 32 mmol/L '25 25 26  ' Calcium 8.9 - 10.3 mg/dL 8.7(L) 8.6(L) 8.8(L)  Total Protein 6.5 - 8.1 g/dL 6.3(L) 6.2(L) 6.4(L)  Total Bilirubin 0.3 - 1.2 mg/dL 0.2(L) 0.3 0.3  Alkaline Phos 38 - 126 U/L 90 74 69  AST 15 - 41 U/L 15 13(L) 17  ALT 0 - 44 U/L '9 10 11      ' RADIOGRAPHIC STUDIES: I have personally reviewed the radiological images as listed and agreed with the findings in the report. No results found.   ASSESSMENT & PLAN:  Brittney Spencer is a 70 y.o. female with   1. Rectal adenocarcinoma, withliverand right pelvic metastasis, recurrence from previous colon cancer vs new primary -Diagnosed on routine screening colonoscopy12/2021, rectal mass biopsy showed invasive adenocarcinoma, arising from a tubular adenoma -Her 1/12/22PETshowedpositive uptake of known rectal tumor and soft tissue mass within the right iliac fossa indicating recurrent prior colon cancer. There is alsoleft lobe of liver lesion is FDG avid concerning for liver metastasis.pt declined liver biopsy  -whether this is metastatic rectal or recurrent/metastatic colon cancer, this is likely not curable, but still treatable. She previously declined referral for HIPEC -She began first-line systemic chemo with dose-reduced FOLFIRI on2/6/22.  -Bevacizumabadded, and irinotecan/5FU increased with cycle 2, but due to poor tolerance/diarrhea irinotecan was reduced with cycle 3 to 130 mg per metered  squared and she has been tolerating well. -Her tumor marker is decreasing. Will check again in 2 weeks with her next cycle. She is likely responding to chemo -Follow-up in 2 weeks before next cycle chemo, plan to repeat staging scan in 3 to 4 weeks   2. Cancer of ascending colon, pT4bN1bM0, stage IIIC, MSI-stable, (+) surgical margins at pelvic wall -She was diagnosed in 12/2015. She is s/p right hemicolectomy with rightsalpingo oophorectomyand adjuvant ChemoRT.  -she declined adjuvant chemodue to the concern of side effects and impact on her quality of life. -Her 04/2019 CT scan was NED -With recent rectal cancer diagnosis, herPET from 07/12/20 indicates soft tissue mass  within the right iliac fossa is noted and consistent with local tumor recurrence from previous ascendingcolon tumor.  -She previously declined option of liver biopsy.   3. Mild Anemia  -Has been stable and mild for the past year. -worse lately due to her newly diagnosed rectal cancerand starting chemo -denies bleeding -labs reviewed, remains low -monitoring  4. HTN, Anxiety -She'll follow-up with her primary care physician and continue medication -She uses Xanax as needed. -stable, not worse since her recent rectal cancer diagnosis  5. Hypokalemia  -K at 3.3on1/28/22 -WNL today (10/05/20) -She notes she is not taking K supplement   Plan: -Labs reviewed, okay to proceed with C5 FOLFIRI today -labs and f/u in 2 weeks, and restaging CT after cycle 6    No problem-specific Assessment & Plan notes found for this encounter.   No orders of the defined types were placed in this encounter.  All questions were answered. The patient knows to call the clinic with any problems, questions or concerns. No barriers to learning was detected. The total time spent in the appointment was 30 minutes.     [name], MD 10/05/2020   I, Wilburn Mylar, am acting as scribe for [name], MD.   I have reviewed  the above documentation for accuracy and completeness, and I agree with the above.

## 2020-10-05 ENCOUNTER — Inpatient Hospital Stay: Payer: Federal, State, Local not specified - PPO

## 2020-10-05 ENCOUNTER — Inpatient Hospital Stay: Payer: Federal, State, Local not specified - PPO | Admitting: Nutrition

## 2020-10-05 ENCOUNTER — Inpatient Hospital Stay (HOSPITAL_BASED_OUTPATIENT_CLINIC_OR_DEPARTMENT_OTHER): Payer: Federal, State, Local not specified - PPO | Admitting: Hematology

## 2020-10-05 ENCOUNTER — Telehealth: Payer: Self-pay | Admitting: Hematology

## 2020-10-05 ENCOUNTER — Inpatient Hospital Stay: Payer: Federal, State, Local not specified - PPO | Attending: Nurse Practitioner

## 2020-10-05 ENCOUNTER — Encounter: Payer: Self-pay | Admitting: Hematology

## 2020-10-05 ENCOUNTER — Other Ambulatory Visit: Payer: Self-pay

## 2020-10-05 VITALS — BP 106/72 | HR 82 | Temp 97.5°F | Resp 18 | Ht 63.0 in | Wt 103.7 lb

## 2020-10-05 DIAGNOSIS — Z5189 Encounter for other specified aftercare: Secondary | ICD-10-CM | POA: Diagnosis not present

## 2020-10-05 DIAGNOSIS — C787 Secondary malignant neoplasm of liver and intrahepatic bile duct: Secondary | ICD-10-CM

## 2020-10-05 DIAGNOSIS — Z5112 Encounter for antineoplastic immunotherapy: Secondary | ICD-10-CM | POA: Diagnosis present

## 2020-10-05 DIAGNOSIS — C182 Malignant neoplasm of ascending colon: Secondary | ICD-10-CM | POA: Diagnosis present

## 2020-10-05 DIAGNOSIS — D649 Anemia, unspecified: Secondary | ICD-10-CM | POA: Insufficient documentation

## 2020-10-05 DIAGNOSIS — D500 Iron deficiency anemia secondary to blood loss (chronic): Secondary | ICD-10-CM | POA: Diagnosis not present

## 2020-10-05 DIAGNOSIS — I10 Essential (primary) hypertension: Secondary | ICD-10-CM | POA: Diagnosis not present

## 2020-10-05 DIAGNOSIS — Z9221 Personal history of antineoplastic chemotherapy: Secondary | ICD-10-CM | POA: Diagnosis not present

## 2020-10-05 DIAGNOSIS — Z5111 Encounter for antineoplastic chemotherapy: Secondary | ICD-10-CM | POA: Diagnosis present

## 2020-10-05 DIAGNOSIS — C20 Malignant neoplasm of rectum: Secondary | ICD-10-CM

## 2020-10-05 DIAGNOSIS — Z95828 Presence of other vascular implants and grafts: Secondary | ICD-10-CM

## 2020-10-05 DIAGNOSIS — Z923 Personal history of irradiation: Secondary | ICD-10-CM | POA: Diagnosis not present

## 2020-10-05 LAB — CMP (CANCER CENTER ONLY)
ALT: 9 U/L (ref 0–44)
AST: 15 U/L (ref 15–41)
Albumin: 3.6 g/dL (ref 3.5–5.0)
Alkaline Phosphatase: 90 U/L (ref 38–126)
Anion gap: 10 (ref 5–15)
BUN: 9 mg/dL (ref 8–23)
CO2: 25 mmol/L (ref 22–32)
Calcium: 8.7 mg/dL — ABNORMAL LOW (ref 8.9–10.3)
Chloride: 104 mmol/L (ref 98–111)
Creatinine: 0.79 mg/dL (ref 0.44–1.00)
GFR, Estimated: >60 mL/min (ref >60)
Glucose, Bld: 85 mg/dL (ref 70–99)
Potassium: 3.8 mmol/L (ref 3.5–5.1)
Sodium: 139 mmol/L (ref 135–145)
Total Bilirubin: 0.2 mg/dL — ABNORMAL LOW (ref 0.3–1.2)
Total Protein: 6.3 g/dL — ABNORMAL LOW (ref 6.5–8.1)

## 2020-10-05 LAB — CBC WITH DIFFERENTIAL (CANCER CENTER ONLY)
Abs Immature Granulocytes: 0.17 10*3/uL — ABNORMAL HIGH (ref 0.00–0.07)
Basophils Absolute: 0 10*3/uL (ref 0.0–0.1)
Basophils Relative: 0 %
Eosinophils Absolute: 0.1 10*3/uL (ref 0.0–0.5)
Eosinophils Relative: 1 %
HCT: 30.1 % — ABNORMAL LOW (ref 36.0–46.0)
Hemoglobin: 9.6 g/dL — ABNORMAL LOW (ref 12.0–15.0)
Immature Granulocytes: 2 %
Lymphocytes Relative: 15 %
Lymphs Abs: 1.5 10*3/uL (ref 0.7–4.0)
MCH: 27 pg (ref 26.0–34.0)
MCHC: 31.9 g/dL (ref 30.0–36.0)
MCV: 84.8 fL (ref 80.0–100.0)
Monocytes Absolute: 0.7 10*3/uL (ref 0.1–1.0)
Monocytes Relative: 7 %
Neutro Abs: 7.6 10*3/uL (ref 1.7–7.7)
Neutrophils Relative %: 75 %
Platelet Count: 174 10*3/uL (ref 150–400)
RBC: 3.55 MIL/uL — ABNORMAL LOW (ref 3.87–5.11)
RDW: 14.8 % (ref 11.5–15.5)
WBC Count: 10.1 10*3/uL (ref 4.0–10.5)
nRBC: 0 % (ref 0.0–0.2)

## 2020-10-05 LAB — TOTAL PROTEIN, URINE DIPSTICK: Protein, ur: NEGATIVE mg/dL

## 2020-10-05 MED ORDER — SODIUM CHLORIDE 0.9 % IV SOLN
130.0000 mg/m2 | Freq: Once | INTRAVENOUS | Status: AC
  Administered 2020-10-05: 200 mg via INTRAVENOUS
  Filled 2020-10-05: qty 10

## 2020-10-05 MED ORDER — SODIUM CHLORIDE 0.9 % IV SOLN
400.0000 mg/m2 | Freq: Once | INTRAVENOUS | Status: AC
  Administered 2020-10-05: 620 mg via INTRAVENOUS
  Filled 2020-10-05: qty 31

## 2020-10-05 MED ORDER — ATROPINE SULFATE 1 MG/ML IJ SOLN
0.5000 mg | Freq: Once | INTRAMUSCULAR | Status: AC | PRN
  Administered 2020-10-05: 0.5 mg via INTRAVENOUS

## 2020-10-05 MED ORDER — SODIUM CHLORIDE 0.9 % IV SOLN
2200.0000 mg/m2 | INTRAVENOUS | Status: DC
  Administered 2020-10-05: 3400 mg via INTRAVENOUS
  Filled 2020-10-05: qty 68

## 2020-10-05 MED ORDER — ATROPINE SULFATE 1 MG/ML IJ SOLN
INTRAMUSCULAR | Status: AC
  Filled 2020-10-05: qty 1

## 2020-10-05 MED ORDER — PALONOSETRON HCL INJECTION 0.25 MG/5ML
0.2500 mg | Freq: Once | INTRAVENOUS | Status: AC
Start: 2020-10-05 — End: 2020-10-05
  Administered 2020-10-05: 0.25 mg via INTRAVENOUS

## 2020-10-05 MED ORDER — SODIUM CHLORIDE 0.9% FLUSH
10.0000 mL | Freq: Once | INTRAVENOUS | Status: AC
  Administered 2020-10-05: 10 mL
  Filled 2020-10-05: qty 10

## 2020-10-05 MED ORDER — SODIUM CHLORIDE 0.9 % IV SOLN
10.0000 mg | Freq: Once | INTRAVENOUS | Status: AC
  Administered 2020-10-05: 10 mg via INTRAVENOUS
  Filled 2020-10-05: qty 10

## 2020-10-05 MED ORDER — SODIUM CHLORIDE 0.9 % IV SOLN
250.0000 mg | Freq: Once | INTRAVENOUS | Status: AC
  Administered 2020-10-05: 250 mg via INTRAVENOUS
  Filled 2020-10-05: qty 10

## 2020-10-05 MED ORDER — SODIUM CHLORIDE 0.9% FLUSH
10.0000 mL | INTRAVENOUS | Status: DC | PRN
  Filled 2020-10-05: qty 10

## 2020-10-05 MED ORDER — HEPARIN SOD (PORK) LOCK FLUSH 100 UNIT/ML IV SOLN
500.0000 [IU] | Freq: Once | INTRAVENOUS | Status: DC | PRN
  Filled 2020-10-05: qty 5

## 2020-10-05 MED ORDER — SODIUM CHLORIDE 0.9 % IV SOLN
Freq: Once | INTRAVENOUS | Status: AC
  Filled 2020-10-05: qty 250

## 2020-10-05 MED ORDER — PALONOSETRON HCL INJECTION 0.25 MG/5ML
INTRAVENOUS | Status: AC
  Filled 2020-10-05: qty 5

## 2020-10-05 NOTE — Telephone Encounter (Signed)
Scheduled per los. Gave avs and calendar  

## 2020-10-05 NOTE — Progress Notes (Signed)
Nutrition follow-up completed with patient during infusion for metastatic rectal cancer.  Weight decreased to 103.7 pounds on April 7 down from 105.6 pounds March 23. Patient reports she is trying to eat a little bit more often and has been gradually trying to add a little bit of fiber.  She reports good tolerance.  She did not try [employer] nutrition supplements.  Nutrition diagnosis: Food and nutrition related knowledge deficit improved.  Intervention: Continue strategies for following low fiber diet as tolerated. Try [employer] shakes for added calories and protein.  Samples given. Encouraged weight gain.  Monitoring, evaluation, goals: Patient will tolerate adequate calories and protein to minimize further weight loss.  Next visit: Wednesday, May 18 during infusion.  **Disclaimer: This note was dictated with voice recognition software. Similar sounding words can inadvertently be transcribed and this note may contain transcription errors which may not have been corrected upon publication of note.**

## 2020-10-06 ENCOUNTER — Other Ambulatory Visit: Payer: Self-pay

## 2020-10-06 DIAGNOSIS — C20 Malignant neoplasm of rectum: Secondary | ICD-10-CM

## 2020-10-06 DIAGNOSIS — E860 Dehydration: Secondary | ICD-10-CM

## 2020-10-07 ENCOUNTER — Other Ambulatory Visit: Payer: Self-pay

## 2020-10-07 ENCOUNTER — Inpatient Hospital Stay: Payer: Federal, State, Local not specified - PPO

## 2020-10-07 VITALS — BP 120/78 | HR 72 | Temp 97.0°F | Resp 19

## 2020-10-07 DIAGNOSIS — C20 Malignant neoplasm of rectum: Secondary | ICD-10-CM

## 2020-10-07 DIAGNOSIS — C787 Secondary malignant neoplasm of liver and intrahepatic bile duct: Secondary | ICD-10-CM

## 2020-10-07 DIAGNOSIS — E860 Dehydration: Secondary | ICD-10-CM

## 2020-10-07 DIAGNOSIS — Z5112 Encounter for antineoplastic immunotherapy: Secondary | ICD-10-CM | POA: Diagnosis not present

## 2020-10-07 MED ORDER — SODIUM CHLORIDE 0.9 % IV SOLN
Freq: Once | INTRAVENOUS | Status: DC
  Filled 2020-10-07: qty 250

## 2020-10-07 MED ORDER — SODIUM CHLORIDE 0.9% FLUSH
10.0000 mL | INTRAVENOUS | Status: DC | PRN
  Administered 2020-10-07: 10 mL
  Filled 2020-10-07: qty 10

## 2020-10-07 MED ORDER — PEGFILGRASTIM-JMDB 6 MG/0.6ML ~~LOC~~ SOSY
6.0000 mg | PREFILLED_SYRINGE | Freq: Once | SUBCUTANEOUS | Status: AC
  Administered 2020-10-07: 6 mg via SUBCUTANEOUS

## 2020-10-07 MED ORDER — SODIUM CHLORIDE 0.9 % IV SOLN
Freq: Once | INTRAVENOUS | Status: AC
  Filled 2020-10-07: qty 250

## 2020-10-07 MED ORDER — HEPARIN SOD (PORK) LOCK FLUSH 100 UNIT/ML IV SOLN
500.0000 [IU] | Freq: Once | INTRAVENOUS | Status: AC | PRN
  Administered 2020-10-07: 500 [IU]
  Filled 2020-10-07: qty 5

## 2020-10-07 MED ORDER — PEGFILGRASTIM-JMDB 6 MG/0.6ML ~~LOC~~ SOSY
PREFILLED_SYRINGE | SUBCUTANEOUS | Status: AC
  Filled 2020-10-07: qty 0.6

## 2020-10-07 NOTE — Patient Instructions (Addendum)
Rehydration, Adult Rehydration is the replacement of body fluids, salts, and minerals (electrolytes) that are lost during dehydration. Dehydration is when there is not enough water or other fluids in the body. This happens when you lose more fluids than you take in. Common causes of dehydration include:  Not drinking enough fluids. This can occur when you are ill or doing activities that require a lot of energy, especially in hot weather.  Conditions that cause loss of water or other fluids, such as diarrhea, vomiting, sweating, or urinating a lot.  Other illnesses, such as fever or infection.  Certain medicines, such as those that remove excess fluid from the body (diuretics). Symptoms of mild or moderate dehydration may include thirst, dry lips and mouth, and dizziness. Symptoms of severe dehydration may include increased heart rate, confusion, fainting, and not urinating. For severe dehydration, you may need to get fluids through an IV at the hospital. For mild or moderate dehydration, you can usually rehydrate at home by drinking certain fluids as told by your health care provider. What are the risks? Generally, rehydration is safe. However, taking in too much fluid (overhydration) can be a problem. This is rare. Overhydration can cause an electrolyte imbalance, kidney failure, or a decrease in salt (sodium) levels in the body. Supplies needed You will need an oral rehydration solution (ORS) if your health care provider tells you to use one. This is a drink to treat dehydration. It can be found in pharmacies and retail stores. How to rehydrate Fluids Follow instructions from your health care provider for rehydration. The kind of fluid and the amount you should drink depend on your condition. In general, you should choose drinks that you prefer.  If told by your health care provider, drink an ORS. ? Make an ORS by following instructions on the package. ? Start by drinking small amounts,  about  cup (120 mL) every 5-10 minutes. ? Slowly increase how much you drink until you have taken the amount recommended by your health care provider.  Drink enough clear fluids to keep your urine pale yellow. If you were told to drink an ORS, finish it first, then start slowly drinking other clear fluids. Drink fluids such as: ? Water. This includes sparkling water and flavored water. Drinking only water can lead to having too little sodium in your body (hyponatremia). Follow the advice of your health care provider. ? Water from ice chips you suck on. ? Fruit juice with water you add to it (diluted). ? Sports drinks. ? Hot or cold herbal teas. ? Broth-based soups. ? Milk or milk products. Food Follow instructions from your health care provider about what to eat while you rehydrate. Your health care provider may recommend that you slowly begin eating regular foods in small amounts.  Eat foods that contain a healthy balance of electrolytes, such as bananas, oranges, potatoes, tomatoes, and spinach.  Avoid foods that are greasy or contain a lot of sugar. In some cases, you may get nutrition through a feeding tube that is passed through your nose and into your stomach (nasogastric tube, or NG tube). This may be done if you have uncontrolled vomiting or diarrhea.   Beverages to avoid Certain beverages may make dehydration worse. While you rehydrate, avoid drinking alcohol.   How to tell if you are recovering from dehydration You may be recovering from dehydration if:  You are urinating more often than before you started rehydrating.  Your urine is pale yellow.  Your energy level   improves.  You vomit less frequently.  You have diarrhea less frequently.  Your appetite improves or returns to normal.  You feel less dizzy or less light-headed.  Your skin tone and color start to look more normal. Follow these instructions at home:  Take over-the-counter and prescription medicines only  as told by your health care provider.  Do not take sodium tablets. Doing this can lead to having too much sodium in your body (hypernatremia). Contact a health care provider if:  You continue to have symptoms of mild or moderate dehydration, such as: ? Thirst. ? Dry lips. ? Slightly dry mouth. ? Dizziness. ? Dark urine or less urine than normal. ? Muscle cramps.  You continue to vomit or have diarrhea. Get help right away if you:  Have symptoms of dehydration that get worse.  Have a fever.  Have a severe headache.  Have been vomiting and the following happens: ? Your vomiting gets worse or does not go away. ? Your vomit includes blood or green matter (bile). ? You cannot eat or drink without vomiting.  Have problems with urination or bowel movements, such as: ? Diarrhea that gets worse or does not go away. ? Blood in your stool (feces). This may cause stool to look black and tarry. ? Not urinating, or urinating only a small amount of very dark urine, within 6-8 hours.  Have trouble breathing.  Have symptoms that get worse with treatment. These symptoms may represent a serious problem that is an emergency. Do not wait to see if the symptoms will go away. Get medical help right away. Call your local emergency services (911 in the [place] Do not drive yourself to the hospital. Summary  Rehydration is the replacement of body fluids and minerals (electrolytes) that are lost during dehydration.  Follow instructions from your health care provider for rehydration. The kind of fluid and amount you should drink depend on your condition.  Slowly increase how much you drink until you have taken the amount recommended by your health care provider.  Contact your health care provider if you continue to show signs of mild or moderate dehydration. This information is not intended to replace advice given to you by your health care provider. Make sure you discuss any questions you have with  your health care provider. Document Revised: 08/18/2019 Document Reviewed: 06/28/2019 Elsevier Patient Education  2021 [employer]  Pegfilgrastim injection What is this medicine? PEGFILGRASTIM (PEG fil gra stim) is a long-acting granulocyte colony-stimulating factor that stimulates the growth of neutrophils, a type of white blood cell important in the body's fight against infection. It is used to reduce the incidence of fever and infection in patients with certain types of cancer who are receiving chemotherapy that affects the bone marrow, and to increase survival after being exposed to high doses of radiation. This medicine may be used for other purposes; ask your health care provider or pharmacist if you have questions. COMMON BRAND NAME(S): Fulphila, Neulasta, Nyvepria, [name], Ziextenzo What should I tell my health care provider before I take this medicine? They need to know if you have any of these conditions:  kidney disease  latex allergy  ongoing radiation therapy  sickle cell disease  skin reactions to acrylic adhesives (On-Body Injector only)  an unusual or allergic reaction to pegfilgrastim, filgrastim, other medicines, foods, dyes, or preservatives  pregnant or trying to get pregnant  breast-feeding How should I use this medicine? This medicine is for injection under the skin. If you   get this medicine at home, you will be taught how to prepare and give the pre-filled syringe or how to use the On-body Injector. Refer to the patient Instructions for Use for detailed instructions. Use exactly as directed. Tell your healthcare provider immediately if you suspect that the On-body Injector may not have performed as intended or if you suspect the use of the On-body Injector resulted in a missed or partial dose. It is important that you put your used needles and syringes in a special sharps container. Do not put them in a trash can. If you do not have a sharps container, call  your pharmacist or healthcare provider to get one. Talk to your pediatrician regarding the use of this medicine in children. While this drug may be prescribed for selected conditions, precautions do apply. Overdosage: If you think you have taken too much of this medicine contact a poison control center or emergency room at once. NOTE: This medicine is only for you. Do not share this medicine with others. What if I miss a dose? It is important not to miss your dose. Call your doctor or health care professional if you miss your dose. If you miss a dose due to an On-body Injector failure or leakage, a new dose should be administered as soon as possible using a single prefilled syringe for manual use. What may interact with this medicine? Interactions have not been studied. This list may not describe all possible interactions. Give your health care provider a list of all the medicines, herbs, non-prescription drugs, or dietary supplements you use. Also tell them if you smoke, drink alcohol, or use illegal drugs. Some items may interact with your medicine. What should I watch for while using this medicine? Your condition will be monitored carefully while you are receiving this medicine. You may need blood work done while you are taking this medicine. Talk to your health care provider about your risk of cancer. You may be more at risk for certain types of cancer if you take this medicine. If you are going to need a MRI, CT scan, or other procedure, tell your doctor that you are using this medicine (On-Body Injector only). What side effects may I notice from receiving this medicine? Side effects that you should report to your doctor or health care professional as soon as possible:  allergic reactions (skin rash, itching or hives, swelling of the face, lips, or tongue)  back pain  dizziness  fever  pain, redness, or irritation at site where injected  pinpoint red spots on the skin  red or  dark-brown urine  shortness of breath or breathing problems  stomach or side pain, or pain at the shoulder  swelling  tiredness  trouble passing urine or change in the amount of urine  unusual bruising or bleeding Side effects that usually do not require medical attention (report to your doctor or health care professional if they continue or are bothersome):  bone pain  muscle pain This list may not describe all possible side effects. Call your doctor for medical advice about side effects. You may report side effects to FDA at 1-800-FDA-1088. Where should I keep my medicine? Keep out of the reach of children. If you are using this medicine at home, you will be instructed on how to store it. Throw away any unused medicine after the expiration date on the label. NOTE: This sheet is a summary. It may not cover all possible information. If you have questions about this medicine,   talk to your doctor, pharmacist, or health care provider.  2021 Elsevier/Gold Standard (2019-07-09 13:20:51)  

## 2020-10-19 ENCOUNTER — Other Ambulatory Visit: Payer: Federal, State, Local not specified - PPO

## 2020-10-19 ENCOUNTER — Other Ambulatory Visit: Payer: Self-pay

## 2020-10-19 ENCOUNTER — Encounter: Payer: Self-pay | Admitting: Hematology

## 2020-10-19 ENCOUNTER — Inpatient Hospital Stay: Payer: Federal, State, Local not specified - PPO | Admitting: Hematology

## 2020-10-19 ENCOUNTER — Inpatient Hospital Stay: Payer: Federal, State, Local not specified - PPO

## 2020-10-19 VITALS — BP 117/83 | HR 80 | Temp 97.6°F | Resp 15 | Ht 63.0 in | Wt 102.3 lb

## 2020-10-19 DIAGNOSIS — D500 Iron deficiency anemia secondary to blood loss (chronic): Secondary | ICD-10-CM

## 2020-10-19 DIAGNOSIS — C182 Malignant neoplasm of ascending colon: Secondary | ICD-10-CM | POA: Diagnosis not present

## 2020-10-19 DIAGNOSIS — I10 Essential (primary) hypertension: Secondary | ICD-10-CM | POA: Diagnosis not present

## 2020-10-19 DIAGNOSIS — C787 Secondary malignant neoplasm of liver and intrahepatic bile duct: Secondary | ICD-10-CM

## 2020-10-19 DIAGNOSIS — Z95828 Presence of other vascular implants and grafts: Secondary | ICD-10-CM

## 2020-10-19 DIAGNOSIS — C20 Malignant neoplasm of rectum: Secondary | ICD-10-CM

## 2020-10-19 DIAGNOSIS — Z5112 Encounter for antineoplastic immunotherapy: Secondary | ICD-10-CM | POA: Diagnosis not present

## 2020-10-19 LAB — CBC WITH DIFFERENTIAL (CANCER CENTER ONLY)
Abs Immature Granulocytes: 0.15 10*3/uL — ABNORMAL HIGH (ref 0.00–0.07)
Basophils Absolute: 0 10*3/uL (ref 0.0–0.1)
Basophils Relative: 0 %
Eosinophils Absolute: 0.1 10*3/uL (ref 0.0–0.5)
Eosinophils Relative: 1 %
HCT: 29.6 % — ABNORMAL LOW (ref 36.0–46.0)
Hemoglobin: 9.2 g/dL — ABNORMAL LOW (ref 12.0–15.0)
Immature Granulocytes: 1 %
Lymphocytes Relative: 12 %
Lymphs Abs: 1.4 10*3/uL (ref 0.7–4.0)
MCH: 26.8 pg (ref 26.0–34.0)
MCHC: 31.1 g/dL (ref 30.0–36.0)
MCV: 86.3 fL (ref 80.0–100.0)
Monocytes Absolute: 0.8 10*3/uL (ref 0.1–1.0)
Monocytes Relative: 7 %
Neutro Abs: 9.1 10*3/uL — ABNORMAL HIGH (ref 1.7–7.7)
Neutrophils Relative %: 79 %
Platelet Count: 248 10*3/uL (ref 150–400)
RBC: 3.43 MIL/uL — ABNORMAL LOW (ref 3.87–5.11)
RDW: 15 % (ref 11.5–15.5)
WBC Count: 11.5 10*3/uL — ABNORMAL HIGH (ref 4.0–10.5)
nRBC: 0 % (ref 0.0–0.2)

## 2020-10-19 LAB — CMP (CANCER CENTER ONLY)
ALT: <6 U/L (ref 0–44)
AST: 13 U/L — ABNORMAL LOW (ref 15–41)
Albumin: 3.4 g/dL — ABNORMAL LOW (ref 3.5–5.0)
Alkaline Phosphatase: 82 U/L (ref 38–126)
Anion gap: 11 (ref 5–15)
BUN: 9 mg/dL (ref 8–23)
CO2: 26 mmol/L (ref 22–32)
Calcium: 8.9 mg/dL (ref 8.9–10.3)
Chloride: 104 mmol/L (ref 98–111)
Creatinine: 0.78 mg/dL (ref 0.44–1.00)
GFR, Estimated: >60 mL/min (ref >60)
Glucose, Bld: 79 mg/dL (ref 70–99)
Potassium: 3.6 mmol/L (ref 3.5–5.1)
Sodium: 141 mmol/L (ref 135–145)
Total Bilirubin: <0.2 mg/dL — ABNORMAL LOW (ref 0.3–1.2)
Total Protein: 6.6 g/dL (ref 6.5–8.1)

## 2020-10-19 LAB — CEA (IN HOUSE-CHCC): CEA (CHCC-In House): 4.25 ng/mL (ref 0.00–5.00)

## 2020-10-19 LAB — TOTAL PROTEIN, URINE DIPSTICK: Protein, ur: NEGATIVE mg/dL

## 2020-10-19 MED ORDER — PALONOSETRON HCL INJECTION 0.25 MG/5ML
INTRAVENOUS | Status: AC
  Filled 2020-10-19: qty 5

## 2020-10-19 MED ORDER — SODIUM CHLORIDE 0.9 % IV SOLN
400.0000 mg/m2 | Freq: Once | INTRAVENOUS | Status: AC
  Administered 2020-10-19: 620 mg via INTRAVENOUS
  Filled 2020-10-19: qty 31

## 2020-10-19 MED ORDER — SODIUM CHLORIDE 0.9% FLUSH
10.0000 mL | Freq: Once | INTRAVENOUS | Status: AC
  Administered 2020-10-19: 10 mL
  Filled 2020-10-19: qty 10

## 2020-10-19 MED ORDER — SODIUM CHLORIDE 0.9% FLUSH
10.0000 mL | INTRAVENOUS | Status: DC | PRN
  Filled 2020-10-19: qty 10

## 2020-10-19 MED ORDER — SODIUM CHLORIDE 0.9 % IV SOLN
250.0000 mg | Freq: Once | INTRAVENOUS | Status: AC
  Administered 2020-10-19: 250 mg via INTRAVENOUS
  Filled 2020-10-19: qty 10

## 2020-10-19 MED ORDER — PALONOSETRON HCL INJECTION 0.25 MG/5ML
0.2500 mg | Freq: Once | INTRAVENOUS | Status: AC
  Administered 2020-10-19: 0.25 mg via INTRAVENOUS

## 2020-10-19 MED ORDER — HEPARIN SOD (PORK) LOCK FLUSH 100 UNIT/ML IV SOLN
500.0000 [IU] | Freq: Once | INTRAVENOUS | Status: DC | PRN
  Filled 2020-10-19: qty 5

## 2020-10-19 MED ORDER — SODIUM CHLORIDE 0.9 % IV SOLN
Freq: Once | INTRAVENOUS | Status: AC
Start: 2020-10-19 — End: 2020-10-19
  Filled 2020-10-19: qty 250

## 2020-10-19 MED ORDER — SODIUM CHLORIDE 0.9 % IV SOLN
2200.0000 mg/m2 | INTRAVENOUS | Status: DC
  Administered 2020-10-19: 3400 mg via INTRAVENOUS
  Filled 2020-10-19: qty 68

## 2020-10-19 MED ORDER — ATROPINE SULFATE 1 MG/ML IJ SOLN
0.5000 mg | Freq: Once | INTRAMUSCULAR | Status: AC | PRN
  Administered 2020-10-19: 0.5 mg via INTRAVENOUS

## 2020-10-19 MED ORDER — ATROPINE SULFATE 1 MG/ML IJ SOLN
INTRAMUSCULAR | Status: AC
  Filled 2020-10-19: qty 1

## 2020-10-19 MED ORDER — SODIUM CHLORIDE 0.9 % IV SOLN
10.0000 mg | Freq: Once | INTRAVENOUS | Status: AC
  Administered 2020-10-19: 10 mg via INTRAVENOUS
  Filled 2020-10-19: qty 10

## 2020-10-19 MED ORDER — SODIUM CHLORIDE 0.9 % IV SOLN
130.0000 mg/m2 | Freq: Once | INTRAVENOUS | Status: AC
  Administered 2020-10-19: 200 mg via INTRAVENOUS
  Filled 2020-10-19: qty 10

## 2020-10-19 NOTE — Patient Instructions (Signed)
[facility] Discharge Instructions for Patients Receiving Chemotherapy  Today you received the following chemotherapy agents: Bevacizumab (Avastin), Irinotecan, Leucovorin, and Fluorouracil   To help prevent nausea and vomiting after your treatment, we encourage you to take your nausea medication  as prescribed.    If you develop nausea and vomiting that is not controlled by your nausea medication, call the clinic.   BELOW ARE SYMPTOMS THAT SHOULD BE REPORTED IMMEDIATELY:  *FEVER GREATER THAN 100.5 F  *CHILLS WITH OR WITHOUT FEVER  NAUSEA AND VOMITING THAT IS NOT CONTROLLED WITH YOUR NAUSEA MEDICATION  *UNUSUAL SHORTNESS OF BREATH  *UNUSUAL BRUISING OR BLEEDING  TENDERNESS IN MOUTH AND THROAT WITH OR WITHOUT PRESENCE OF ULCERS  *URINARY PROBLEMS  *BOWEL PROBLEMS  UNUSUAL RASH Items with * indicate a potential emergency and should be followed up as soon as possible.  Feel free to call the clinic should you have any questions or concerns. The clinic phone number is [phone].  Please show the [facility] at check-in to the Emergency Department and triage nurse.

## 2020-10-19 NOTE — Progress Notes (Signed)
[facility]   [phone] [phone]   Clinic Follow up Note   Patient Care Team: Patient, No Pcp Per (Inactive) as PCP - General (General Practice) [name], NP as Nurse Practitioner (Oncology) [name], MD as Consulting Physician (Oncology) [name], RN as Oncology Nurse Navigator  Date of Service:  10/19/2020  CHIEF COMPLAINT: f/u of h/o colon cancer and recently diagnosed metastatic rectal cancer  SUMMARY OF ONCOLOGIC HISTORY: Oncology History Overview Note  Cancer Staging Cancer of ascending colon [facility]) Staging form: Colon and Rectum, AJCC 7th Edition - Clinical stage from 02/07/2016: Stage IIIC (T4b, N1b, M0) - Signed by [name], MD on 03/04/2016    Cancer of ascending colon ([place])  10/25/2015 Imaging   CT ABD/PELVIS:  Inflammatory changes inferior to the cecal tip appear improved, there is still irregular soft tissue thickening of the cecal tip, and there are adjacent prominent lymph nodes in the ileocolonic mesentery, measuring 13 mm on image 49 and 8 mm on image 52. In addition, there is a 2.5 x 1.8 cm nodule on image 46 which has central low density. Therefore, these findings are moderately suspicious for an underlying cecal malignancy with perforation.    01/19/2016 Procedure   COLONOSCOPY per Dr. [name]: Fungating, ulcerated mass almost obstructing mid ascending colon   01/19/2016 Initial Biopsy   Diagnosis Surgical [P], cecal mass - INVASIVE ADENOCARCINOMA WITH ULCERATION. - SEE COMMENT.   02/05/2016 Tumor Marker   Patient's tumor was tested for the following markers: CEA Results of the tumor marker test revealed 5.7.   02/07/2016 Initial Diagnosis   Cancer of ascending colon ([place])   02/07/2016 Definitive Surgery   Laparoscopic assisted right hemicolectomy and right salpingo oopherectomy--Dr. [name]   02/07/2016 Pathologic Stage   p T4 N1b   2/43 nodes +   02/07/2016 Pathology Results   MMR normal; G2  adenocarcinoma;proximal & distal margins negative; soft tissue mass on pelvic sidewall + for adenocarcinoma with positive margin MSI Stable   03/08/2016 Imaging   CT chest negative for metastasis.    03/19/2016 - 04/25/2016 Radiation Therapy   Adjuvant irradiation, 50 gray in 28 fractions   03/19/2016 - 04/22/2016 Chemotherapy   Xeloda 1500 mg twice daily, started on 03/19/2016, dose reduced to 1000 mg twice daily from week 3 due to neutropenia, and patient stopped 3 days before last dose radiation due to difficulty swallowing the pill    05/20/2016 -  Adjuvant Chemotherapy   Patient declined adjuvant chemotherapy   09/16/2016 Imaging   CT CAP w Contrast 1. No evidence of local tumor recurrence at the ileocolic anastomosis. 2. No findings suspicious for metastatic disease in the chest, abdomen or pelvis. 3. Nonspecific trace free fluid in the pelvic cul-de-sac. 4. Stable solitary 3 mm right upper lobe pulmonary nodule, for which 6 month stability has been demonstrated, probably benign. 5. Additional findings include stable right posterior pericardial cyst and small calcified uterine fibroids.   05/13/2017 Imaging   CT CAP W Contrast 05/13/17 IMPRESSION: 1. No current findings of residual or recurrent malignancy. 2. Mild prominence of stool throughout the colon. Nondistended portions of the rectum. 3. Several tiny pulmonary nodules are stable from the earliest available comparison of 03/08/2016 and probably benign, but may merit surveillance. 4. Other imaging findings of potential clinical significance: Old granulomatous disease. Aortoiliac atherosclerotic vascular disease. Lumbar spondylosis and degenerative disc disease. Stable amount of trace free pelvic fluid.   04/27/2018 Imaging   04/27/2018 CT CAP IMPRESSION:  Stable exam. No evidence of recurrent or metastatic carcinoma within the chest, abdomen, or pelvis   04/19/2019 Imaging   CT CAP W Contrast  IMPRESSION: Chest  Impression:   1. No evidence of thoracic metastasis. 2. Stable small bilateral pulmonary nodules.   Abdomen / Pelvis Impression:   1. No evidence local colorectal carcinoma recurrence or metastasis in the abdomen pelvis. 2. Post RIGHT hemicolectomy.   Rectal cancer metastasized to liver ([place])  06/15/2020 Procedure   Screening Colonoscopy by Dr [name]  IMPRESSION - Decreased sphincter tone and internal hemorrhoids that prolapse with straining, but require manual replacement into the anal canal (Grade III) found on digital rectal exam. - Patent side-to-side ileo-colonic anastomosis, characterized by healthy appearing mucosa. - The examined portion of the ileum was normal. - One diminutive polyp in the proximal transverse colon, removed with a cold biopsy forceps. Resected and retrieved. - Likely malignant partially obstructing tumor in the mid rectum. Biopsied. Tattooed. - The examination was otherwise normal on direct and retroflexion views.   06/15/2020 Initial Biopsy   Diagnosis 1. Transverse Colon Polyp - HYPERPLASTIC POLYP 2. Rectum, biopsy - ADENOCARCINOMA ARISING IN A TUBULAR ADENOMA WITH HIGH-GRADE DYSPLASIA. SEE NOTE Diagnosis Note 2. Dr. [name] reviewed the case and concurs with the diagnosis. Dr. [name] was notified on 06/16/2020.   06/28/2020 Imaging   CT CAP  IMPRESSION: 1. New low-density focus in the anterior aspect of the lateral segment LEFT hepatic lobe measuring 1.2 x 1.0 cm, compatible with small metastatic lesion in the LEFT hepatic lobe. 2. Soft tissue in the RIGHT iliac fossa following RIGHT hemicolectomy invades the psoas musculature and is slowly enlarging over time, more linear on the prior study now highly concerning for recurrence/metastasis to this location. 3. Signs of enteritis, potentially post radiation changes of the small bowel. Tethered small bowel in the RIGHT lower quadrant shows focal thickening and narrowing suspicious for small  bowel involvement and developing partial obstruction though currently contrast passes beyond this point into the colon. 4. Rectal thickening in this patient with known rectal mass as described. 5. No evidence of metastatic disease in the chest. 6. Stable small pulmonary nodules. 7.  and aortic atherosclerosis.   Aortic Atherosclerosis (ICD10-I70.0) and Emphysema (ICD10-J43.9).   07/04/2020 Initial Diagnosis   Rectal cancer metastasized to liver ([place])   07/12/2020 PET scan   IMPRESSION: 1. Exam positive for FDG avid rectal tumor which corresponds to the recent colonoscopy findings. 2. FDG avid soft tissue mass within the right iliac fossa is noted and consistent with local tumor recurrence from previous ascending colon tumor. 3. Lateral segment left lobe of liver lesion is FDG avid concerning for liver metastasis. 4. No specific findings identified to suggest metastatic disease to the chest.   08/10/2020 -  Chemotherapy   First-line FOLFIRI q2weeks starting 08/10/20. dose reduced with cycle 1. Irinotecan/5FU increased and Bevacizumab added with cycle 2 on 08/23/2020       CURRENT THERAPY:  First-lineFOLFIRI q2weeks starting2/10/22.dose reduced with cycle 1.Bevacizumab added with cycle 2 on 08/23/2020  INTERVAL HISTORY:  Brittney Spencer is here for a follow up of metastatic rectal cancer. She was last seen by me on 10/05/20. She presents to the clinic alone. She reports lack of appetite and nausea the day following treatment. She notes the shakes she was given by nutrition [employer] shakes) have been great and helpful. She states she is handling things better mentally. She uses Miralax, which she notes has helped. She also uses a mouthwash  given to her by her dentist that helps with the discoloration.  REVIEW OF SYSTEMS:   Constitutional: Denies fevers, chills or abnormal weight loss Eyes: Denies blurriness of vision Ears, nose, mouth, throat, and face: Denies mucositis or  sore throat Respiratory: Denies cough, dyspnea or wheezes Cardiovascular: Denies palpitation, chest discomfort or lower extremity swelling Gastrointestinal:  Denies nausea, heartburn or change in bowel habits Skin: Denies abnormal skin rashes Lymphatics: Denies new lymphadenopathy or easy bruising Neurological:Denies tingling or new weaknesses, (+) some numbness in hands Behavioral/Psych: Mood is stable, no new changes  All other systems were reviewed with the patient and are negative.  MEDICAL HISTORY:  Past Medical History:  Diagnosis Date  . AAA (abdominal aortic aneurysm) ([facility])    infrarenal 4.1 cmper s-9-19 scan on chart  . Anemia    hx of  . Anxiety    has PRN meds  . Colon cancer ([place]) 2017   RIGHT hemi colectomy-s/p sx  . GERD (gastroesophageal reflux disease)    OTC meds/diet control  . Hypertension    on meds  . Macular pucker, right eye 10/06/2019  . Retinal detachment, right 09/2019  . Vitamin D deficiency     SURGICAL HISTORY: Past Surgical History:  Procedure Laterality Date  . COLONOSCOPY  2018   [facility]ams  . COLONSCOPY  12/2015  . IR IMAGING GUIDED PORT INSERTION  08/04/2020  . LAPAROSCOPIC RIGHT HEMI COLECTOMY Right 02/07/2016   Procedure: LAPAROSCOPIC ASSISTED RIGHT HEMI COLECTOMY AND RIGHT SALPINGO OOPHERECTOMY;  Surgeon: [name], MD;  Location: [facility];  Service: General;  Laterality: Right;  . RETINAL DETACHMENT SURGERY  09/2019    I have reviewed the social history and family history with the patient and they are unchanged from previous note.  ALLERGIES:  is allergic to fish allergy, peanut-containing drug products, soy allergy, and buspirone.  MEDICATIONS:  Current Outpatient Medications  Medication Sig Dispense Refill  . ALPRAZolam (XANAX) 0.25 MG tablet Take 1 tablet (0.25 mg total) by mouth daily as needed for anxiety. 30 tablet 0  . Cholecalciferol (VITAMIN D3 PO) Take by mouth daily.    . diphenoxylate-atropine (LOMOTIL) 2.5-0.025 MG  tablet Take 2 tablets by mouth 4 (four) times daily as needed for diarrhea or loose stools. 45 tablet 3  . docusate sodium (COLACE) 100 MG capsule 1 capsule as needed    . lidocaine-prilocaine (EMLA) cream Apply 1 application topically as needed. 30 g 1  . NORVASC 2.5 MG tablet Take 1 tablet (2.5 mg total) by mouth daily. 30 tablet 3  . ondansetron (ZOFRAN) 8 MG tablet Take 1 tablet (8 mg total) by mouth every 8 (eight) hours as needed for nausea or vomiting. 20 tablet 2  . potassium chloride (KLOR-CON) 10 MEQ tablet Take 1 tablet (10 mEq total) by mouth daily. 10 tablet 0  . prochlorperazine (COMPAZINE) 10 MG tablet Take 1 tablet (10 mg total) by mouth every 6 (six) hours as needed for nausea or vomiting. 30 tablet 2   Current Facility-Administered Medications  Medication Dose Route Frequency Provider Last Rate Last Admin  . 0.9 %  sodium chloride infusion  500 mL Intravenous Once Nelida Meuse III, MD        PHYSICAL EXAMINATION: ECOG PERFORMANCE STATUS: 1 - Symptomatic but completely ambulatory  Vitals:   10/19/20 1148  BP: 117/83  Pulse: 80  Resp: 15  Temp: 97.6 F (36.4 C)  SpO2: 100%   Filed Weights   10/19/20 1148  Weight: 102 lb 4.8 oz (  46.4 kg)    Due to COVID19 we will limit examination to appearance. Patient had no complaints.  GENERAL:alert, no distress and comfortable SKIN: skin color normal, no rashes or significant lesions EYES: normal, Conjunctiva are pink and non-injected, sclera clear  NEURO: alert & oriented x 3 with fluent speech  LABORATORY DATA:  I have reviewed the data as listed CBC Latest Ref Rng & Units 10/19/2020 10/05/2020 09/20/2020  WBC 4.0 - 10.5 K/uL 11.5(H) 10.1 7.6  Hemoglobin 12.0 - 15.0 g/dL 9.2(L) 9.6(L) 9.7(L)  Hematocrit 36.0 - 46.0 % 29.6(L) 30.1(L) 30.6(L)  Platelets 150 - 400 K/uL 248 174 202     CMP Latest Ref Rng & Units 10/19/2020 10/05/2020 09/20/2020  Glucose 70 - 99 mg/dL 79 85 73  BUN 8 - 23 mg/dL 9 9 7(L)  Creatinine 0.44 -  1.00 mg/dL 0.78 0.79 0.76  Sodium 135 - 145 mmol/L 141 139 141  Potassium 3.5 - 5.1 mmol/L 3.6 3.8 3.5  Chloride 98 - 111 mmol/L 104 104 105  CO2 22 - 32 mmol/L '26 25 25  ' Calcium 8.9 - 10.3 mg/dL 8.9 8.7(L) 8.6(L)  Total Protein 6.5 - 8.1 g/dL 6.6 6.3(L) 6.2(L)  Total Bilirubin 0.3 - 1.2 mg/dL <0.2(L) 0.2(L) 0.3  Alkaline Phos 38 - 126 U/L 82 90 74  AST 15 - 41 U/L 13(L) 15 13(L)  ALT 0 - 44 U/L <'6 9 10      ' RADIOGRAPHIC STUDIES: I have personally reviewed the radiological images as listed and agreed with the findings in the report. No results found.   ASSESSMENT & PLAN:  Brittney Spencer is a 70 y.o. female with   1. Rectal adenocarcinoma, withliverand right pelvic metastasis, recurrence from previous colon cancer vs new primary -Diagnosed on routine screening colonoscopy12/2021, rectal mass biopsy showed invasive adenocarcinoma, arising from a tubular adenoma -Her 1/12/22PETshowedpositive uptake of known rectal tumor and soft tissue mass within the right iliac fossa indicating recurrent prior colon cancer. There is alsoleft lobe of liver lesion is FDG avid concerning for liver metastasis.pt declined liver biopsy  -whether this is metastatic rectal or recurrent/metastatic colon cancer, this is likely not curable, but still treatable. She previously declined referral for HIPEC -She began first-line systemic chemo with dose-reduced FOLFIRI on2/6/22.  -Bevacizumabadded, and irinotecan/5FU increased with cycle 2, but due to poor tolerance/diarrhea irinotecan was reduced with cycle 3 to 130 mg per metered squared and she has been tolerating well. -Her tumor marker CEA has dropped since she started chemo. Repeat obtained today, results pending. -Restaging CT scheduled for 10/27/20   2. Cancer of ascending colon, pT4bN1bM0, stage IIIC, MSI-stable, (+) surgical margins at pelvic wall -She was diagnosed in 12/2015. She is s/p right hemicolectomy with rightsalpingo  oophorectomyand adjuvant ChemoRT.  -she declined adjuvant chemodue to the concern of side effects and impact on her quality of life. -Her 04/2019 CT scan was NED -With recent rectal cancer diagnosis, herPET from 07/12/20 indicates soft tissue mass within the right iliac fossa is noted and consistent with local tumor recurrence from previous ascendingcolon tumor.  -She previously declined option of liver biopsy.   3. Mild Anemia  -Has been stable and mild for the past year. -worse lately due to her newly diagnosed rectal cancerand starting chemo -denies bleeding -labs reviewed, Hg 9.2 today (10/19/20) -monitoring  4. HTN, Anxiety -She'll follow-up with her primary care physician and continue medication -She uses Xanax as needed. -stable, not worse since her recent rectal cancer diagnosis  5. Hypokalemia  -  K at 3.3on1/28/22 -WNL 10/05/20 -She notes she is not taking K supplement   Plan: -Labs reviewed, adequate to proceed with C6 FOLFIRI today -restaging CT scheduled for 10/27/20 -labs and f/u in 2 weeks    No problem-specific Assessment & Plan notes found for this encounter.   No orders of the defined types were placed in this encounter.  All questions were answered. The patient knows to call the clinic with any problems, questions or concerns. No barriers to learning was detected. The total time spent in the appointment was 30 minutes.     [name], MD 10/19/2020   I, Wilburn Mylar, am acting as scribe for [name], MD.   I have reviewed the above documentation for accuracy and completeness, and I agree with the above.

## 2020-10-21 ENCOUNTER — Inpatient Hospital Stay: Payer: Federal, State, Local not specified - PPO

## 2020-10-21 ENCOUNTER — Other Ambulatory Visit: Payer: Self-pay

## 2020-10-21 ENCOUNTER — Ambulatory Visit: Payer: Federal, State, Local not specified - PPO

## 2020-10-21 VITALS — BP 114/74 | HR 81 | Temp 98.4°F | Resp 18

## 2020-10-21 DIAGNOSIS — Z5112 Encounter for antineoplastic immunotherapy: Secondary | ICD-10-CM | POA: Diagnosis not present

## 2020-10-21 DIAGNOSIS — Z95828 Presence of other vascular implants and grafts: Secondary | ICD-10-CM

## 2020-10-21 MED ORDER — HEPARIN SOD (PORK) LOCK FLUSH 100 UNIT/ML IV SOLN
500.0000 [IU] | Freq: Once | INTRAVENOUS | Status: AC | PRN
  Administered 2020-10-21: 500 [IU]
  Filled 2020-10-21: qty 5

## 2020-10-21 MED ORDER — PEGFILGRASTIM-JMDB 6 MG/0.6ML ~~LOC~~ SOSY
6.0000 mg | PREFILLED_SYRINGE | Freq: Once | SUBCUTANEOUS | Status: AC
  Administered 2020-10-21: 6 mg via SUBCUTANEOUS

## 2020-10-21 MED ORDER — SODIUM CHLORIDE 0.9% FLUSH
10.0000 mL | INTRAVENOUS | Status: DC | PRN
  Administered 2020-10-21: 10 mL
  Filled 2020-10-21: qty 10

## 2020-10-21 MED ORDER — SODIUM CHLORIDE 0.9 % IV SOLN
Freq: Once | INTRAVENOUS | Status: AC
Start: 2020-10-21 — End: 2020-10-21
  Filled 2020-10-21: qty 250

## 2020-10-21 NOTE — Patient Instructions (Signed)
Rehydration, Adult Rehydration is the replacement of body fluids, salts, and minerals (electrolytes) that are lost during dehydration. Dehydration is when there is not enough water or other fluids in the body. This happens when you lose more fluids than you take in. Common causes of dehydration include:  Not drinking enough fluids. This can occur when you are ill or doing activities that require a lot of energy, especially in hot weather.  Conditions that cause loss of water or other fluids, such as diarrhea, vomiting, sweating, or urinating a lot.  Other illnesses, such as fever or infection.  Certain medicines, such as those that remove excess fluid from the body (diuretics). Symptoms of mild or moderate dehydration may include thirst, dry lips and mouth, and dizziness. Symptoms of severe dehydration may include increased heart rate, confusion, fainting, and not urinating. For severe dehydration, you may need to get fluids through an IV at the hospital. For mild or moderate dehydration, you can usually rehydrate at home by drinking certain fluids as told by your health care provider. What are the risks? Generally, rehydration is safe. However, taking in too much fluid (overhydration) can be a problem. This is rare. Overhydration can cause an electrolyte imbalance, kidney failure, or a decrease in salt (sodium) levels in the body. Supplies needed You will need an oral rehydration solution (ORS) if your health care provider tells you to use one. This is a drink to treat dehydration. It can be found in pharmacies and retail stores. How to rehydrate Fluids Follow instructions from your health care provider for rehydration. The kind of fluid and the amount you should drink depend on your condition. In general, you should choose drinks that you prefer.  If told by your health care provider, drink an ORS. ? Make an ORS by following instructions on the package. ? Start by drinking small amounts,  about  cup (120 mL) every 5-10 minutes. ? Slowly increase how much you drink until you have taken the amount recommended by your health care provider.  Drink enough clear fluids to keep your urine pale yellow. If you were told to drink an ORS, finish it first, then start slowly drinking other clear fluids. Drink fluids such as: ? Water. This includes sparkling water and flavored water. Drinking only water can lead to having too little sodium in your body (hyponatremia). Follow the advice of your health care provider. ? Water from ice chips you suck on. ? Fruit juice with water you add to it (diluted). ? Sports drinks. ? Hot or cold herbal teas. ? Broth-based soups. ? Milk or milk products. Food Follow instructions from your health care provider about what to eat while you rehydrate. Your health care provider may recommend that you slowly begin eating regular foods in small amounts.  Eat foods that contain a healthy balance of electrolytes, such as bananas, oranges, potatoes, tomatoes, and spinach.  Avoid foods that are greasy or contain a lot of sugar. In some cases, you may get nutrition through a feeding tube that is passed through your nose and into your stomach (nasogastric tube, or NG tube). This may be done if you have uncontrolled vomiting or diarrhea.   Beverages to avoid Certain beverages may make dehydration worse. While you rehydrate, avoid drinking alcohol.   How to tell if you are recovering from dehydration You may be recovering from dehydration if:  You are urinating more often than before you started rehydrating.  Your urine is pale yellow.  Your energy level   improves.  You vomit less frequently.  You have diarrhea less frequently.  Your appetite improves or returns to normal.  You feel less dizzy or less light-headed.  Your skin tone and color start to look more normal. Follow these instructions at home:  Take over-the-counter and prescription medicines only  as told by your health care provider.  Do not take sodium tablets. Doing this can lead to having too much sodium in your body (hypernatremia). Contact a health care provider if:  You continue to have symptoms of mild or moderate dehydration, such as: ? Thirst. ? Dry lips. ? Slightly dry mouth. ? Dizziness. ? Dark urine or less urine than normal. ? Muscle cramps.  You continue to vomit or have diarrhea. Get help right away if you:  Have symptoms of dehydration that get worse.  Have a fever.  Have a severe headache.  Have been vomiting and the following happens: ? Your vomiting gets worse or does not go away. ? Your vomit includes blood or green matter (bile). ? You cannot eat or drink without vomiting.  Have problems with urination or bowel movements, such as: ? Diarrhea that gets worse or does not go away. ? Blood in your stool (feces). This may cause stool to look black and tarry. ? Not urinating, or urinating only a small amount of very dark urine, within 6-8 hours.  Have trouble breathing.  Have symptoms that get worse with treatment. These symptoms may represent a serious problem that is an emergency. Do not wait to see if the symptoms will go away. Get medical help right away. Call your local emergency services (911 in the [place] Do not drive yourself to the hospital. Summary  Rehydration is the replacement of body fluids and minerals (electrolytes) that are lost during dehydration.  Follow instructions from your health care provider for rehydration. The kind of fluid and amount you should drink depend on your condition.  Slowly increase how much you drink until you have taken the amount recommended by your health care provider.  Contact your health care provider if you continue to show signs of mild or moderate dehydration. This information is not intended to replace advice given to you by your health care provider. Make sure you discuss any questions you have with  your health care provider. Document Revised: 08/18/2019 Document Reviewed: 06/28/2019 [employer] Patient Education  2021 [employer]  Pegfilgrastim injection What is this medicine? PEGFILGRASTIM (PEG fil gra stim) is a long-acting granulocyte colony-stimulating factor that stimulates the growth of neutrophils, a type of white blood cell important in the body's fight against infection. It is used to reduce the incidence of fever and infection in patients with certain types of cancer who are receiving chemotherapy that affects the bone marrow, and to increase survival after being exposed to high doses of radiation. This medicine may be used for other purposes; ask your health care provider or pharmacist if you have questions. COMMON BRAND NAME(S): Fulphila, Neulasta, Nyvepria, [name], Ziextenzo What should I tell my health care provider before I take this medicine? They need to know if you have any of these conditions:  kidney disease  latex allergy  ongoing radiation therapy  sickle cell disease  skin reactions to acrylic adhesives (On-Body Injector only)  an unusual or allergic reaction to pegfilgrastim, filgrastim, other medicines, foods, dyes, or preservatives  pregnant or trying to get pregnant  breast-feeding How should I use this medicine? This medicine is for injection under the skin. If you   get this medicine at home, you will be taught how to prepare and give the pre-filled syringe or how to use the On-body Injector. Refer to the patient Instructions for Use for detailed instructions. Use exactly as directed. Tell your healthcare provider immediately if you suspect that the On-body Injector may not have performed as intended or if you suspect the use of the On-body Injector resulted in a missed or partial dose. It is important that you put your used needles and syringes in a special sharps container. Do not put them in a trash can. If you do not have a sharps container, call  your pharmacist or healthcare provider to get one. Talk to your pediatrician regarding the use of this medicine in children. While this drug may be prescribed for selected conditions, precautions do apply. Overdosage: If you think you have taken too much of this medicine contact a poison control center or emergency room at once. NOTE: This medicine is only for you. Do not share this medicine with others. What if I miss a dose? It is important not to miss your dose. Call your doctor or health care professional if you miss your dose. If you miss a dose due to an On-body Injector failure or leakage, a new dose should be administered as soon as possible using a single prefilled syringe for manual use. What may interact with this medicine? Interactions have not been studied. This list may not describe all possible interactions. Give your health care provider a list of all the medicines, herbs, non-prescription drugs, or dietary supplements you use. Also tell them if you smoke, drink alcohol, or use illegal drugs. Some items may interact with your medicine. What should I watch for while using this medicine? Your condition will be monitored carefully while you are receiving this medicine. You may need blood work done while you are taking this medicine. Talk to your health care provider about your risk of cancer. You may be more at risk for certain types of cancer if you take this medicine. If you are going to need a MRI, CT scan, or other procedure, tell your doctor that you are using this medicine (On-Body Injector only). What side effects may I notice from receiving this medicine? Side effects that you should report to your doctor or health care professional as soon as possible:  allergic reactions (skin rash, itching or hives, swelling of the face, lips, or tongue)  back pain  dizziness  fever  pain, redness, or irritation at site where injected  pinpoint red spots on the skin  red or  dark-brown urine  shortness of breath or breathing problems  stomach or side pain, or pain at the shoulder  swelling  tiredness  trouble passing urine or change in the amount of urine  unusual bruising or bleeding Side effects that usually do not require medical attention (report to your doctor or health care professional if they continue or are bothersome):  bone pain  muscle pain This list may not describe all possible side effects. Call your doctor for medical advice about side effects. You may report side effects to FDA at 1-800-FDA-1088. Where should I keep my medicine? Keep out of the reach of children. If you are using this medicine at home, you will be instructed on how to store it. Throw away any unused medicine after the expiration date on the label. NOTE: This sheet is a summary. It may not cover all possible information. If you have questions about this medicine,   talk to your doctor, pharmacist, or health care provider.  2021 [employer]vier/[employer] Standard (2019-07-09 13:20:51)  

## 2020-10-27 ENCOUNTER — Encounter (HOSPITAL_COMMUNITY): Payer: Self-pay

## 2020-10-27 ENCOUNTER — Other Ambulatory Visit: Payer: Self-pay

## 2020-10-27 ENCOUNTER — Ambulatory Visit (HOSPITAL_COMMUNITY)
Admission: RE | Admit: 2020-10-27 | Discharge: 2020-10-27 | Disposition: A | Discharge status: Home / Self Care | Payer: Federal, State, Local not specified - PPO | Source: Ambulatory Visit | Attending: Nurse Practitioner | Admitting: Nurse Practitioner

## 2020-10-27 ENCOUNTER — Other Ambulatory Visit: Payer: Self-pay | Admitting: Hematology

## 2020-10-27 DIAGNOSIS — C20 Malignant neoplasm of rectum: Secondary | ICD-10-CM | POA: Diagnosis present

## 2020-10-27 DIAGNOSIS — C787 Secondary malignant neoplasm of liver and intrahepatic bile duct: Secondary | ICD-10-CM | POA: Diagnosis present

## 2020-10-27 DIAGNOSIS — C182 Malignant neoplasm of ascending colon: Secondary | ICD-10-CM

## 2020-10-27 MED ORDER — IOHEXOL 300 MG/ML  SOLN
100.0000 mL | Freq: Once | INTRAMUSCULAR | Status: AC | PRN
  Administered 2020-10-27: 80 mL via INTRAVENOUS

## 2020-10-27 NOTE — Progress Notes (Signed)
[facility]   [phone] [phone]) [phone]   Clinic Follow up Note   Patient Care Team: Patient, No Pcp Per (Inactive) as PCP - General (General Practice) [name], NP as Nurse Practitioner (Oncology) [name], MD as Consulting Physician (Oncology) [name], RN as Oncology Nurse Navigator  Date of Service:  11/01/2020  CHIEF COMPLAINT:  f/u of h/o colon cancer and recently diagnosed metastatic rectal cancer  SUMMARY OF ONCOLOGIC HISTORY: Oncology History Overview Note  Cancer Staging Cancer of ascending colon [facility]) Staging form: Colon and Rectum, AJCC 7th Edition - Clinical stage from 02/07/2016: Stage IIIC (T4b, N1b, M0) - Signed by [name], MD on 03/04/2016    Cancer of ascending colon ([place])  10/25/2015 Imaging   CT ABD/PELVIS:  Inflammatory changes inferior to the cecal tip appear improved, there is still irregular soft tissue thickening of the cecal tip, and there are adjacent prominent lymph nodes in the ileocolonic mesentery, measuring 13 mm on image 49 and 8 mm on image 52. In addition, there is a 2.5 x 1.8 cm nodule on image 46 which has central low density. Therefore, these findings are moderately suspicious for an underlying cecal malignancy with perforation.    01/19/2016 Procedure   COLONOSCOPY per Dr. [name]: Fungating, ulcerated mass almost obstructing mid ascending colon   01/19/2016 Initial Biopsy   Diagnosis Surgical [P], cecal mass - INVASIVE ADENOCARCINOMA WITH ULCERATION. - SEE COMMENT.   02/05/2016 Tumor Marker   Patient's tumor was tested for the following markers: CEA Results of the tumor marker test revealed 5.7.   02/07/2016 Initial Diagnosis   Cancer of ascending colon ([place])   02/07/2016 Definitive Surgery   Laparoscopic assisted right hemicolectomy and right salpingo oopherectomy--Dr. [name]   02/07/2016 Pathologic Stage   p T4 N1b   2/43 nodes +   02/07/2016 Pathology Results   MMR normal; G2  adenocarcinoma;proximal & distal margins negative; soft tissue mass on pelvic sidewall + for adenocarcinoma with positive margin MSI Stable   03/08/2016 Imaging   CT chest negative for metastasis.    03/19/2016 - 04/25/2016 Radiation Therapy   Adjuvant irradiation, 50 gray in 28 fractions   03/19/2016 - 04/22/2016 Chemotherapy   Xeloda 1500 mg twice daily, started on 03/19/2016, dose reduced to 1000 mg twice daily from week 3 due to neutropenia, and patient stopped 3 days before last dose radiation due to difficulty swallowing the pill    05/20/2016 -  Adjuvant Chemotherapy   Patient declined adjuvant chemotherapy   09/16/2016 Imaging   CT CAP w Contrast 1. No evidence of local tumor recurrence at the ileocolic anastomosis. 2. No findings suspicious for metastatic disease in the chest, abdomen or pelvis. 3. Nonspecific trace free fluid in the pelvic cul-de-sac. 4. Stable solitary 3 mm right upper lobe pulmonary nodule, for which 6 month stability has been demonstrated, probably benign. 5. Additional findings include stable right posterior pericardial cyst and small calcified uterine fibroids.   05/13/2017 Imaging   CT CAP W Contrast 05/13/17 IMPRESSION: 1. No current findings of residual or recurrent malignancy. 2. Mild prominence of stool throughout the colon. Nondistended portions of the rectum. 3. Several tiny pulmonary nodules are stable from the earliest available comparison of 03/08/2016 and probably benign, but may merit surveillance. 4. Other imaging findings of potential clinical significance: Old granulomatous disease. Aortoiliac atherosclerotic vascular disease. Lumbar spondylosis and degenerative disc disease. Stable amount of trace free pelvic fluid.   04/27/2018 Imaging   04/27/2018 CT CAP  IMPRESSION: Stable exam. No evidence of recurrent or metastatic carcinoma within the chest, abdomen, or pelvis   04/19/2019 Imaging   CT CAP W Contrast  IMPRESSION: Chest  Impression:   1. No evidence of thoracic metastasis. 2. Stable small bilateral pulmonary nodules.   Abdomen / Pelvis Impression:   1. No evidence local colorectal carcinoma recurrence or metastasis in the abdomen pelvis. 2. Post RIGHT hemicolectomy.   Rectal cancer metastasized to liver ([place])  06/15/2020 Procedure   Screening Colonoscopy by Dr [name]  IMPRESSION - Decreased sphincter tone and internal hemorrhoids that prolapse with straining, but require manual replacement into the anal canal (Grade III) found on digital rectal exam. - Patent side-to-side ileo-colonic anastomosis, characterized by healthy appearing mucosa. - The examined portion of the ileum was normal. - One diminutive polyp in the proximal transverse colon, removed with a cold biopsy forceps. Resected and retrieved. - Likely malignant partially obstructing tumor in the mid rectum. Biopsied. Tattooed. - The examination was otherwise normal on direct and retroflexion views.   06/15/2020 Initial Biopsy   Diagnosis 1. Transverse Colon Polyp - HYPERPLASTIC POLYP 2. Rectum, biopsy - ADENOCARCINOMA ARISING IN A TUBULAR ADENOMA WITH HIGH-GRADE DYSPLASIA. SEE NOTE Diagnosis Note 2. Dr. [name] reviewed the case and concurs with the diagnosis. Dr. [name] was notified on 06/16/2020.   06/28/2020 Imaging   CT CAP  IMPRESSION: 1. New low-density focus in the anterior aspect of the lateral segment LEFT hepatic lobe measuring 1.2 x 1.0 cm, compatible with small metastatic lesion in the LEFT hepatic lobe. 2. Soft tissue in the RIGHT iliac fossa following RIGHT hemicolectomy invades the psoas musculature and is slowly enlarging over time, more linear on the prior study now highly concerning for recurrence/metastasis to this location. 3. Signs of enteritis, potentially post radiation changes of the small bowel. Tethered small bowel in the RIGHT lower quadrant shows focal thickening and narrowing suspicious for small  bowel involvement and developing partial obstruction though currently contrast passes beyond this point into the colon. 4. Rectal thickening in this patient with known rectal mass as described. 5. No evidence of metastatic disease in the chest. 6. Stable small pulmonary nodules. 7.  and aortic atherosclerosis.   Aortic Atherosclerosis (ICD10-I70.0) and Emphysema (ICD10-J43.9).   07/04/2020 Initial Diagnosis   Rectal cancer metastasized to liver ([place])   07/12/2020 PET scan   IMPRESSION: 1. Exam positive for FDG avid rectal tumor which corresponds to the recent colonoscopy findings. 2. FDG avid soft tissue mass within the right iliac fossa is noted and consistent with local tumor recurrence from previous ascending colon tumor. 3. Lateral segment left lobe of liver lesion is FDG avid concerning for liver metastasis. 4. No specific findings identified to suggest metastatic disease to the chest.   08/10/2020 -  Chemotherapy   First-line FOLFIRI q2weeks starting 08/10/20. dose reduced with cycle 1. Irinotecan/5FU increased and Bevacizumab added with cycle 2 on 08/23/2020    10/27/2020 Imaging   CT CAP  IMPRESSION: 1. Interval decrease in size of the hypermetabolic left hepatic lesion, consistent with metastatic disease. No new liver lesion evident. 2. Interval resolution of the hypermetabolic soft tissue lesion along the right iliac fossa with no measurable soft tissue lesion remaining at this location today. 3. Similar appearance of soft tissue fullness in the rectum at the site of the hypermetabolic lesion seen previously. 4. Stable tiny bilateral pulmonary nodules. Continued attention on follow-up recommended. 5. Small volume free fluid in the pelvis. 6. Aortic Atherosclerosis (ICD10-I70.0).  CURRENT THERAPY:  First-lineFOLFIRI q2weeks starting2/10/22.dose reduced with cycle 1.Bevacizumab added with cycle 2 on 08/23/2020  INTERVAL HISTORY:  TARAOLUWA THAKUR is  here for a follow up. She was last seen by me 10/19/20. She presents to the clinic alone. She notes she is doing well. She notes she is able to eat better lately. However, her side effects from chemo are starting to last longer. She feels she is drinking more water, so she does not feel she needs IVFs regularly. She notes her BMs is better with softer stool. I reviewed her medication list with her. She is not taking lomotil or antiemetics currently. She tries to eat potassium in diet than in supplement.   REVIEW OF SYSTEMS:   Constitutional: Denies fevers, chills or abnormal weight loss Eyes: Denies blurriness of vision Ears, nose, mouth, throat, and face: Denies mucositis or sore throat Respiratory: Denies cough, dyspnea or wheezes Cardiovascular: Denies palpitation, chest discomfort or lower extremity swelling Gastrointestinal:  Denies nausea, heartburn or change in bowel habits Skin: Denies abnormal skin rashes Lymphatics: Denies new lymphadenopathy or easy bruising Neurological:Denies numbness, tingling or new weaknesses Behavioral/Psych: Mood is stable, no new changes  All other systems were reviewed with the patient and are negative.  MEDICAL HISTORY:  Past Medical History:  Diagnosis Date  . AAA (abdominal aortic aneurysm) ([facility])    infrarenal 4.1 cmper s-9-19 scan on chart  . Anemia    hx of  . Anxiety    has PRN meds  . Colon cancer ([place]) 2017   RIGHT hemi colectomy-s/p sx  . GERD (gastroesophageal reflux disease)    OTC meds/diet control  . Hypertension    on meds  . Macular pucker, right eye 10/06/2019  . Retinal detachment, right 09/2019  . Vitamin D deficiency     SURGICAL HISTORY: Past Surgical History:  Procedure Laterality Date  . COLONOSCOPY  2018   [facility]-hams  . COLONSCOPY  12/2015  . IR IMAGING GUIDED PORT INSERTION  08/04/2020  . LAPAROSCOPIC RIGHT HEMI COLECTOMY Right 02/07/2016   Procedure: LAPAROSCOPIC ASSISTED RIGHT HEMI COLECTOMY AND RIGHT SALPINGO  OOPHERECTOMY;  Surgeon: [name], MD;  Location: [facility];  Service: General;  Laterality: Right;  . RETINAL DETACHMENT SURGERY  09/2019    I have reviewed the social history and family history with the patient and they are unchanged from previous note.  ALLERGIES:  is allergic to fish allergy, peanut-containing drug products, soy allergy, and buspirone.  MEDICATIONS:  Current Outpatient Medications  Medication Sig Dispense Refill  . ALPRAZolam (XANAX) 0.25 MG tablet Take 1 tablet (0.25 mg total) by mouth daily as needed for anxiety. 30 tablet 0  . Cholecalciferol (VITAMIN D3 PO) Take by mouth daily.    . diphenoxylate-atropine (LOMOTIL) 2.5-0.025 MG tablet Take 2 tablets by mouth 4 (four) times daily as needed for diarrhea or loose stools. 45 tablet 3  . docusate sodium (COLACE) 100 MG capsule 1 capsule as needed    . lidocaine-prilocaine (EMLA) cream Apply 1 application topically as needed. 30 g 1  . NORVASC 2.5 MG tablet TAKE 1 TABLET BY MOUTH EVERY DAY 90 tablet 1  . ondansetron (ZOFRAN) 8 MG tablet Take 1 tablet (8 mg total) by mouth every 8 (eight) hours as needed for nausea or vomiting. 20 tablet 2  . potassium chloride (KLOR-CON) 10 MEQ tablet Take 1 tablet (10 mEq total) by mouth daily. 10 tablet 0  . prochlorperazine (COMPAZINE) 10 MG tablet Take 1 tablet (10 mg total) by mouth  every 6 (six) hours as needed for nausea or vomiting. 30 tablet 2   Current Facility-Administered Medications  Medication Dose Route Frequency Provider Last Rate Last Admin  . 0.9 %  sodium chloride infusion  500 mL Intravenous Once Nelida Meuse III, MD        PHYSICAL EXAMINATION: ECOG PERFORMANCE STATUS: 1 - Symptomatic but completely ambulatory  Vitals:   11/01/20 0841  BP: 102/67  Pulse: 78  Resp: 18  Temp: (!) 97.5 F (36.4 C)  SpO2: 100%   Filed Weights   11/01/20 0841  Weight: 100 lb 1.6 oz (45.4 kg)    Due to COVID19 we will limit examination to appearance. Patient had no  complaints.  GENERAL:alert, no distress and comfortable SKIN: skin color normal, no rashes or significant lesions EYES: normal, Conjunctiva are pink and non-injected, sclera clear  NEURO: alert & oriented x 3 with fluent speech   LABORATORY DATA:  I have reviewed the data as listed CBC Latest Ref Rng & Units 11/01/2020 10/19/2020 10/05/2020  WBC 4.0 - 10.5 K/uL 8.2 11.5(H) 10.1  Hemoglobin 12.0 - 15.0 g/dL 9.0(L) 9.2(L) 9.6(L)  Hematocrit 36.0 - 46.0 % 29.5(L) 29.6(L) 30.1(L)  Platelets 150 - 400 K/uL 215 248 174     CMP Latest Ref Rng & Units 11/01/2020 10/19/2020 10/05/2020  Glucose 70 - 99 mg/dL 84 79 85  BUN 8 - 23 mg/dL '11 9 9  ' Creatinine 0.44 - 1.00 mg/dL 0.77 0.78 0.79  Sodium 135 - 145 mmol/L 141 141 139  Potassium 3.5 - 5.1 mmol/L 3.7 3.6 3.8  Chloride 98 - 111 mmol/L 105 104 104  CO2 22 - 32 mmol/L '26 26 25  ' Calcium 8.9 - 10.3 mg/dL 8.7(L) 8.9 8.7(L)  Total Protein 6.5 - 8.1 g/dL 6.6 6.6 6.3(L)  Total Bilirubin 0.3 - 1.2 mg/dL 0.2(L) <0.2(L) 0.2(L)  Alkaline Phos 38 - 126 U/L 77 82 90  AST 15 - 41 U/L 13(L) 13(L) 15  ALT 0 - 44 U/L 6 <6 9      RADIOGRAPHIC STUDIES: I have personally reviewed the radiological images as listed and agreed with the findings in the report. No results found.   ASSESSMENT & PLAN:  Brittney Spencer is a 70 y.o. female with   1. Rectal adenocarcinoma, withliverand right pelvic metastasis, recurrence from previous colon cancer vs new primary -Diagnosed on routine screening colonoscopy12/2021, rectal mass biopsy showed invasive adenocarcinoma, arising from a tubular adenoma -Her 1/12/22PETshowedpositive uptake of known rectal tumor and soft tissue mass within the right iliac fossa indicating recurrent prior colon cancer. There is alsoleft lobe of liver lesion is FDG avid concerning for liver metastasis.pt declined liver biopsy  -Whether this is metastatic rectal or recurrent/metastatic colon cancer, this is likely not curable, but still  treatable. She previously declined referral for HIPEC -She began first-line systemic chemo with dose-reduced FOLFIRI on2/6/22. Bevacizumabadded, and irinotecan/5FU increased with cycle 2, but due to poor tolerance/diarrhea irinotecan was reduced with cycle 3 and she has been tolerating well. -Her tumor marker CEA has dropped since she started chemo. We discussed her CT CAP from 10/27/20 which showed stable soft tissue fullness in the rectum and stable tiny lung nodules. Scan also shows Interval decrease in size of the hypermetabolic left hepatic Lesion and Interval resolution of the hypermetabolic soft tissue lesion along the right iliac fossa. She is overall responding well to treatment. I personally reviewed scan iamges with patient today.  -She is tolerating treatment still with side effects lasting  longer. She is able to eat more adequately, but weight remains low. I reviewed management with her.  -Labs reviewed, Hg 9. Overall adequate to proceed with C7 FOLFIRI and beva today.  -F/u in 2 weeks.   2. Cancer of ascending colon, pT4bN1bM0, stage IIIC, MSI-stable, (+) surgical margins at pelvic wall -She was diagnosed in 12/2015. She is s/p right hemicolectomy with rightsalpingo oophorectomyand adjuvant ChemoRT.  -she declined adjuvant chemodue to the concern of side effects and impact on her quality of life. -Her 04/2019 CT scan was NED -With recent rectal cancer diagnosis, herPET from 07/12/20 indicates soft tissue mass within the right iliac fossa is noted and consistent with local tumor recurrence from previous ascendingcolon tumor.  -Shepreviouslydeclined option of liver biopsy.   3. Mild Anemia  -Has been stable and mild for the past year. -worse lately due to her newly diagnosed rectal cancerand starting chemo. Denies overt GI bleeding.  -Moderate and stable lately.   4. HTN, Anxiety -She'll follow-up with her primary care physician and continue medication -She uses  Xanax as needed. She is stable.   5. Hypokalemia  -She notes she is not taking K supplement. She has increased potassium in diet.    Plan:  -CT CAP reviewed, shows good response to treatment.  -Labs reviewed and adequate to proceed with C7 FOLFIRI and Beva today  -Lab, flush, F/u and FOLFIRI and Beva in 2, 4, 6 weeks   No problem-specific Assessment & Plan notes found for this encounter.   No orders of the defined types were placed in this encounter.  All questions were answered. The patient knows to call the clinic with any problems, questions or concerns. No barriers to learning was detected. The total time spent in the appointment was 30 minutes.     [name], MD 11/01/2020   I, Joslyn Devon, am acting as scribe for [name], MD.   I have reviewed the above documentation for accuracy and completeness, and I agree with the above.

## 2020-11-01 ENCOUNTER — Encounter: Payer: Self-pay | Admitting: Hematology

## 2020-11-01 ENCOUNTER — Inpatient Hospital Stay: Payer: Federal, State, Local not specified - PPO | Attending: Nurse Practitioner | Admitting: Hematology

## 2020-11-01 ENCOUNTER — Other Ambulatory Visit: Payer: Federal, State, Local not specified - PPO

## 2020-11-01 ENCOUNTER — Inpatient Hospital Stay: Payer: Federal, State, Local not specified - PPO

## 2020-11-01 ENCOUNTER — Other Ambulatory Visit: Payer: Self-pay

## 2020-11-01 VITALS — BP 102/67 | HR 78 | Temp 97.5°F | Resp 18 | Ht 63.0 in | Wt 100.1 lb

## 2020-11-01 DIAGNOSIS — C182 Malignant neoplasm of ascending colon: Secondary | ICD-10-CM

## 2020-11-01 DIAGNOSIS — Z5112 Encounter for antineoplastic immunotherapy: Secondary | ICD-10-CM | POA: Diagnosis present

## 2020-11-01 DIAGNOSIS — I10 Essential (primary) hypertension: Secondary | ICD-10-CM | POA: Diagnosis not present

## 2020-11-01 DIAGNOSIS — Z9221 Personal history of antineoplastic chemotherapy: Secondary | ICD-10-CM | POA: Diagnosis not present

## 2020-11-01 DIAGNOSIS — D649 Anemia, unspecified: Secondary | ICD-10-CM | POA: Diagnosis not present

## 2020-11-01 DIAGNOSIS — C787 Secondary malignant neoplasm of liver and intrahepatic bile duct: Secondary | ICD-10-CM | POA: Diagnosis not present

## 2020-11-01 DIAGNOSIS — F419 Anxiety disorder, unspecified: Secondary | ICD-10-CM | POA: Diagnosis not present

## 2020-11-01 DIAGNOSIS — Z95828 Presence of other vascular implants and grafts: Secondary | ICD-10-CM

## 2020-11-01 DIAGNOSIS — Z5189 Encounter for other specified aftercare: Secondary | ICD-10-CM | POA: Insufficient documentation

## 2020-11-01 DIAGNOSIS — C20 Malignant neoplasm of rectum: Secondary | ICD-10-CM

## 2020-11-01 DIAGNOSIS — E876 Hypokalemia: Secondary | ICD-10-CM | POA: Diagnosis not present

## 2020-11-01 DIAGNOSIS — Z923 Personal history of irradiation: Secondary | ICD-10-CM | POA: Diagnosis not present

## 2020-11-01 DIAGNOSIS — Z5111 Encounter for antineoplastic chemotherapy: Secondary | ICD-10-CM | POA: Insufficient documentation

## 2020-11-01 LAB — CMP (CANCER CENTER ONLY)
ALT: 6 U/L (ref 0–44)
AST: 13 U/L — ABNORMAL LOW (ref 15–41)
Albumin: 3.2 g/dL — ABNORMAL LOW (ref 3.5–5.0)
Alkaline Phosphatase: 77 U/L (ref 38–126)
Anion gap: 10 (ref 5–15)
BUN: 11 mg/dL (ref 8–23)
CO2: 26 mmol/L (ref 22–32)
Calcium: 8.7 mg/dL — ABNORMAL LOW (ref 8.9–10.3)
Chloride: 105 mmol/L (ref 98–111)
Creatinine: 0.77 mg/dL (ref 0.44–1.00)
GFR, Estimated: >60 mL/min (ref >60)
Glucose, Bld: 84 mg/dL (ref 70–99)
Potassium: 3.7 mmol/L (ref 3.5–5.1)
Sodium: 141 mmol/L (ref 135–145)
Total Bilirubin: 0.2 mg/dL — ABNORMAL LOW (ref 0.3–1.2)
Total Protein: 6.6 g/dL (ref 6.5–8.1)

## 2020-11-01 LAB — CBC WITH DIFFERENTIAL (CANCER CENTER ONLY)
Abs Immature Granulocytes: 0.08 10*3/uL — ABNORMAL HIGH (ref 0.00–0.07)
Basophils Absolute: 0 10*3/uL (ref 0.0–0.1)
Basophils Relative: 1 %
Eosinophils Absolute: 0 10*3/uL (ref 0.0–0.5)
Eosinophils Relative: 1 %
HCT: 29.5 % — ABNORMAL LOW (ref 36.0–46.0)
Hemoglobin: 9 g/dL — ABNORMAL LOW (ref 12.0–15.0)
Immature Granulocytes: 1 %
Lymphocytes Relative: 13 %
Lymphs Abs: 1 10*3/uL (ref 0.7–4.0)
MCH: 26.2 pg (ref 26.0–34.0)
MCHC: 30.5 g/dL (ref 30.0–36.0)
MCV: 85.8 fL (ref 80.0–100.0)
Monocytes Absolute: 0.6 10*3/uL (ref 0.1–1.0)
Monocytes Relative: 8 %
Neutro Abs: 6.4 10*3/uL (ref 1.7–7.7)
Neutrophils Relative %: 76 %
Platelet Count: 215 10*3/uL (ref 150–400)
RBC: 3.44 MIL/uL — ABNORMAL LOW (ref 3.87–5.11)
RDW: 15.2 % (ref 11.5–15.5)
WBC Count: 8.2 10*3/uL (ref 4.0–10.5)
nRBC: 0 % (ref 0.0–0.2)

## 2020-11-01 LAB — TOTAL PROTEIN, URINE DIPSTICK: Protein, ur: NEGATIVE mg/dL

## 2020-11-01 MED ORDER — SODIUM CHLORIDE 0.9 % IV SOLN
10.0000 mg | Freq: Once | INTRAVENOUS | Status: AC
  Administered 2020-11-01: 10 mg via INTRAVENOUS
  Filled 2020-11-01: qty 10

## 2020-11-01 MED ORDER — SODIUM CHLORIDE 0.9 % IV SOLN
Freq: Once | INTRAVENOUS | Status: AC
  Filled 2020-11-01: qty 250

## 2020-11-01 MED ORDER — ATROPINE SULFATE 1 MG/ML IJ SOLN
INTRAMUSCULAR | Status: AC
  Filled 2020-11-01: qty 1

## 2020-11-01 MED ORDER — SODIUM CHLORIDE 0.9 % IV SOLN
400.0000 mg/m2 | Freq: Once | INTRAVENOUS | Status: AC
  Administered 2020-11-01: 620 mg via INTRAVENOUS
  Filled 2020-11-01: qty 31

## 2020-11-01 MED ORDER — SODIUM CHLORIDE 0.9 % IV SOLN
130.0000 mg/m2 | Freq: Once | INTRAVENOUS | Status: AC
  Administered 2020-11-01: 200 mg via INTRAVENOUS
  Filled 2020-11-01: qty 10

## 2020-11-01 MED ORDER — PALONOSETRON HCL INJECTION 0.25 MG/5ML
INTRAVENOUS | Status: AC
  Filled 2020-11-01: qty 5

## 2020-11-01 MED ORDER — PALONOSETRON HCL INJECTION 0.25 MG/5ML
0.2500 mg | Freq: Once | INTRAVENOUS | Status: AC
  Administered 2020-11-01: 0.25 mg via INTRAVENOUS

## 2020-11-01 MED ORDER — SODIUM CHLORIDE 0.9 % IV SOLN
250.0000 mg | Freq: Once | INTRAVENOUS | Status: AC
  Administered 2020-11-01: 250 mg via INTRAVENOUS
  Filled 2020-11-01: qty 10

## 2020-11-01 MED ORDER — ATROPINE SULFATE 1 MG/ML IJ SOLN
0.5000 mg | Freq: Once | INTRAMUSCULAR | Status: AC | PRN
  Administered 2020-11-01: 0.5 mg via INTRAVENOUS

## 2020-11-01 MED ORDER — SODIUM CHLORIDE 0.9% FLUSH
10.0000 mL | Freq: Once | INTRAVENOUS | Status: AC
  Administered 2020-11-01: 10 mL
  Filled 2020-11-01: qty 10

## 2020-11-01 MED ORDER — SODIUM CHLORIDE 0.9 % IV SOLN
2200.0000 mg/m2 | INTRAVENOUS | Status: DC
  Administered 2020-11-01: 3400 mg via INTRAVENOUS
  Filled 2020-11-01: qty 68

## 2020-11-01 MED ORDER — SODIUM CHLORIDE 0.9% FLUSH
10.0000 mL | INTRAVENOUS | Status: DC | PRN
  Filled 2020-11-01: qty 10

## 2020-11-01 NOTE — Patient Instructions (Signed)
[facility]  Discharge Instructions: Thank you for choosing [facility] to provide your oncology and hematology care.   If you have a lab appointment with the [facility], please go directly to the [facility] and check in at the registration area.   Wear comfortable clothing and clothing appropriate for easy access to any Portacath or PICC line.   We strive to give you quality time with your provider. You may need to reschedule your appointment if you arrive late (15 or more minutes).  Arriving late affects you and other patients whose appointments are after yours.  Also, if you miss three or more appointments without notifying the office, you may be dismissed from the clinic at the provider's discretion.      For prescription refill requests, have your pharmacy contact our office and allow 72 hours for refills to be completed.    Today you received the following chemotherapy and/or immunotherapy agents: Zirabev/Irinotecan/Leucovorin/Fluorouracil.     To help prevent nausea and vomiting after your treatment, we encourage you to take your nausea medication as directed.  BELOW ARE SYMPTOMS THAT SHOULD BE REPORTED IMMEDIATELY: . *FEVER GREATER THAN 100.4 F (38 C) OR HIGHER . *CHILLS OR SWEATING . *NAUSEA AND VOMITING THAT IS NOT CONTROLLED WITH YOUR NAUSEA MEDICATION . *UNUSUAL SHORTNESS OF BREATH . *UNUSUAL BRUISING OR BLEEDING . *URINARY PROBLEMS (pain or burning when urinating, or frequent urination) . *BOWEL PROBLEMS (unusual diarrhea, constipation, pain near the anus) . TENDERNESS IN MOUTH AND THROAT WITH OR WITHOUT PRESENCE OF ULCERS (sore throat, sores in mouth, or a toothache) . UNUSUAL RASH, SWELLING OR PAIN  . UNUSUAL VAGINAL DISCHARGE OR ITCHING   Items with * indicate a potential emergency and should be followed up as soon as possible or go to the Emergency Department if any problems should occur.  Please show the CHEMOTHERAPY  ALERT CARD or IMMUNOTHERAPY ALERT CARD at check-in to the Emergency Department and triage nurse.  Should you have questions after your visit or need to cancel or reschedule your appointment, please contact [facility]  Dept: [phone]  and follow the prompts.  Office hours are 8:00 a.m. to 4:30 p.m. Monday - Friday. Please note that voicemails left after 4:00 p.m. may not be returned until the following business day.  We are closed weekends and major holidays. You have access to a nurse at all times for urgent questions. Please call the main number to the clinic Dept: [phone] and follow the prompts.   For any non-urgent questions, you may also contact your provider using MyChart. We now offer e-Visits for anyone 34 and older to request care online for non-urgent symptoms. For details visit [email or web address].   Also download the MyChart app! Go to the app store, search "MyChart", open the app, select [facility], and log in with your MyChart username and password.  Due to Covid, a mask is required upon entering the hospital/clinic. If you do not have a mask, one will be given to you upon arrival. For doctor visits, patients may have 1 support person aged 50 or older with them. For treatment visits, patients cannot have anyone with them due to current Covid guidelines and our immunocompromised population.

## 2020-11-01 NOTE — Progress Notes (Signed)
Dose slightly outside of 10% for zirabev. Will continue 250 mg dose.

## 2020-11-03 ENCOUNTER — Inpatient Hospital Stay: Payer: Federal, State, Local not specified - PPO

## 2020-11-03 ENCOUNTER — Telehealth: Payer: Self-pay | Admitting: Hematology

## 2020-11-03 ENCOUNTER — Other Ambulatory Visit: Payer: Self-pay

## 2020-11-03 VITALS — BP 114/65 | HR 67 | Temp 98.2°F | Resp 18

## 2020-11-03 DIAGNOSIS — C20 Malignant neoplasm of rectum: Secondary | ICD-10-CM

## 2020-11-03 DIAGNOSIS — Z5112 Encounter for antineoplastic immunotherapy: Secondary | ICD-10-CM | POA: Diagnosis not present

## 2020-11-03 MED ORDER — SODIUM CHLORIDE 0.9% FLUSH
10.0000 mL | INTRAVENOUS | Status: DC | PRN
  Administered 2020-11-03: 10 mL
  Filled 2020-11-03: qty 10

## 2020-11-03 MED ORDER — PEGFILGRASTIM-JMDB 6 MG/0.6ML ~~LOC~~ SOSY
6.0000 mg | PREFILLED_SYRINGE | Freq: Once | SUBCUTANEOUS | Status: AC
  Administered 2020-11-03: 6 mg via SUBCUTANEOUS

## 2020-11-03 MED ORDER — HEPARIN SOD (PORK) LOCK FLUSH 100 UNIT/ML IV SOLN
500.0000 [IU] | Freq: Once | INTRAVENOUS | Status: AC | PRN
  Administered 2020-11-03: 500 [IU]
  Filled 2020-11-03: qty 5

## 2020-11-03 MED ORDER — PEGFILGRASTIM-JMDB 6 MG/0.6ML ~~LOC~~ SOSY
PREFILLED_SYRINGE | SUBCUTANEOUS | Status: AC
  Filled 2020-11-03: qty 0.6

## 2020-11-03 NOTE — Telephone Encounter (Signed)
Scheduled follow-up appointments per 5/4 los. Patient is aware. 

## 2020-11-03 NOTE — Patient Instructions (Signed)
Pegfilgrastim injection What is this medicine? PEGFILGRASTIM (PEG fil gra stim) is a long-acting granulocyte colony-stimulating factor that stimulates the growth of neutrophils, a type of white blood cell important in the body's fight against infection. It is used to reduce the incidence of fever and infection in patients with certain types of cancer who are receiving chemotherapy that affects the bone marrow, and to increase survival after being exposed to high doses of radiation. This medicine may be used for other purposes; ask your health care provider or pharmacist if you have questions. COMMON BRAND NAME(S): Fulphila, Neulasta, Nyvepria, [name], Ziextenzo What should I tell my health care provider before I take this medicine? They need to know if you have any of these conditions:  kidney disease  latex allergy  ongoing radiation therapy  sickle cell disease  skin reactions to acrylic adhesives (On-Body Injector only)  an unusual or allergic reaction to pegfilgrastim, filgrastim, other medicines, foods, dyes, or preservatives  pregnant or trying to get pregnant  breast-feeding How should I use this medicine? This medicine is for injection under the skin. If you get this medicine at home, you will be taught how to prepare and give the pre-filled syringe or how to use the On-body Injector. Refer to the patient Instructions for Use for detailed instructions. Use exactly as directed. Tell your healthcare provider immediately if you suspect that the On-body Injector may not have performed as intended or if you suspect the use of the On-body Injector resulted in a missed or partial dose. It is important that you put your used needles and syringes in a special sharps container. Do not put them in a trash can. If you do not have a sharps container, call your pharmacist or healthcare provider to get one. Talk to your pediatrician regarding the use of this medicine in children. While this drug  may be prescribed for selected conditions, precautions do apply. Overdosage: If you think you have taken too much of this medicine contact a poison control center or emergency room at once. NOTE: This medicine is only for you. Do not share this medicine with others. What if I miss a dose? It is important not to miss your dose. Call your doctor or health care professional if you miss your dose. If you miss a dose due to an On-body Injector failure or leakage, a new dose should be administered as soon as possible using a single prefilled syringe for manual use. What may interact with this medicine? Interactions have not been studied. This list may not describe all possible interactions. Give your health care provider a list of all the medicines, herbs, non-prescription drugs, or dietary supplements you use. Also tell them if you smoke, drink alcohol, or use illegal drugs. Some items may interact with your medicine. What should I watch for while using this medicine? Your condition will be monitored carefully while you are receiving this medicine. You may need blood work done while you are taking this medicine. Talk to your health care provider about your risk of cancer. You may be more at risk for certain types of cancer if you take this medicine. If you are going to need a MRI, CT scan, or other procedure, tell your doctor that you are using this medicine (On-Body Injector only). What side effects may I notice from receiving this medicine? Side effects that you should report to your doctor or health care professional as soon as possible:  allergic reactions (skin rash, itching or hives, swelling of   the face, lips, or tongue)  back pain  dizziness  fever  pain, redness, or irritation at site where injected  pinpoint red spots on the skin  red or dark-brown urine  shortness of breath or breathing problems  stomach or side pain, or pain at the shoulder  swelling  tiredness  trouble  passing urine or change in the amount of urine  unusual bruising or bleeding Side effects that usually do not require medical attention (report to your doctor or health care professional if they continue or are bothersome):  bone pain  muscle pain This list may not describe all possible side effects. Call your doctor for medical advice about side effects. You may report side effects to FDA at 1-800-FDA-1088. Where should I keep my medicine? Keep out of the reach of children. If you are using this medicine at home, you will be instructed on how to store it. Throw away any unused medicine after the expiration date on the label. NOTE: This sheet is a summary. It may not cover all possible information. If you have questions about this medicine, talk to your doctor, pharmacist, or health care provider.  2021 Elsevier/Gold Standard (2019-07-09 13:20:51)  

## 2020-11-10 NOTE — Progress Notes (Signed)
[place]   Telephone:(336) [phone] Fax:(336) [phone]   Clinic Follow up Note   Patient Care Team: Patient, No Pcp Per (Inactive) as PCP - General (General Practice) [name], NP as Nurse Practitioner (Oncology) [name], MD as Consulting Physician (Oncology) [name], RN as Oncology Nurse Navigator  Date of Service:  11/15/2020  CHIEF COMPLAINT: f/u ofH/o colon cancer and recently diagnosed metastatic rectal cancer  SUMMARY OF ONCOLOGIC HISTORY: Oncology History Overview Note  Cancer Staging Cancer of ascending colon [facility]) Staging form: Colon and Rectum, AJCC 7th Edition - Clinical stage from 02/07/2016: Stage IIIC (T4b, N1b, M0) - Signed by [name], MD on 03/04/2016    Cancer of ascending colon ([place])  10/25/2015 Imaging   CT ABD/PELVIS:  Inflammatory changes inferior to the cecal tip appear improved, there is still irregular soft tissue thickening of the cecal tip, and there are adjacent prominent lymph nodes in the ileocolonic mesentery, measuring 13 mm on image 49 and 8 mm on image 52. In addition, there is a 2.5 x 1.8 cm nodule on image 46 which has central low density. Therefore, these findings are moderately suspicious for an underlying cecal malignancy with perforation.    01/19/2016 Procedure   COLONOSCOPY per Dr. [name]: Fungating, ulcerated mass almost obstructing mid ascending colon   01/19/2016 Initial Biopsy   Diagnosis Surgical [P], cecal mass - INVASIVE ADENOCARCINOMA WITH ULCERATION. - SEE COMMENT.   02/05/2016 Tumor Marker   Patient's tumor was tested for the following markers: CEA Results of the tumor marker test revealed 5.7.   02/07/2016 Initial Diagnosis   Cancer of ascending colon ([place])   02/07/2016 Definitive Surgery   Laparoscopic assisted right hemicolectomy and right salpingo oopherectomy--Dr. [name]   02/07/2016 Pathologic Stage   p T4 N1b   2/43 nodes +   02/07/2016 Pathology Results   MMR normal; G2  adenocarcinoma;proximal & distal margins negative; soft tissue mass on pelvic sidewall + for adenocarcinoma with positive margin MSI Stable   03/08/2016 Imaging   CT chest negative for metastasis.    03/19/2016 - 04/25/2016 Radiation Therapy   Adjuvant irradiation, 50 gray in 28 fractions   03/19/2016 - 04/22/2016 Chemotherapy   Xeloda 1500 mg twice daily, started on 03/19/2016, dose reduced to 1000 mg twice daily from week 3 due to neutropenia, and patient stopped 3 days before last dose radiation due to difficulty swallowing the pill    05/20/2016 -  Adjuvant Chemotherapy   Patient declined adjuvant chemotherapy   09/16/2016 Imaging   CT CAP w Contrast 1. No evidence of local tumor recurrence at the ileocolic anastomosis. 2. No findings suspicious for metastatic disease in the chest, abdomen or pelvis. 3. Nonspecific trace free fluid in the pelvic cul-de-sac. 4. Stable solitary 3 mm right upper lobe pulmonary nodule, for which 6 month stability has been demonstrated, probably benign. 5. Additional findings include stable right posterior pericardial cyst and small calcified uterine fibroids.   05/13/2017 Imaging   CT CAP W Contrast 05/13/17 IMPRESSION: 1. No current findings of residual or recurrent malignancy. 2. Mild prominence of stool throughout the colon. Nondistended portions of the rectum. 3. Several tiny pulmonary nodules are stable from the earliest available comparison of 03/08/2016 and probably benign, but may merit surveillance. 4. Other imaging findings of potential clinical significance: Old granulomatous disease. Aortoiliac atherosclerotic vascular disease. Lumbar spondylosis and degenerative disc disease. Stable amount of trace free pelvic fluid.   04/27/2018 Imaging   04/27/2018 CT CAP IMPRESSION: Stable  exam. No evidence of recurrent or metastatic carcinoma within the chest, abdomen, or pelvis   04/19/2019 Imaging   CT CAP W Contrast  IMPRESSION: Chest  Impression:   1. No evidence of thoracic metastasis. 2. Stable small bilateral pulmonary nodules.   Abdomen / Pelvis Impression:   1. No evidence local colorectal carcinoma recurrence or metastasis in the abdomen pelvis. 2. Post RIGHT hemicolectomy.   Rectal cancer metastasized to liver ([place])  06/15/2020 Procedure   Screening Colonoscopy by Dr [name]  IMPRESSION - Decreased sphincter tone and internal hemorrhoids that prolapse with straining, but require manual replacement into the anal canal (Grade III) found on digital rectal exam. - Patent side-to-side ileo-colonic anastomosis, characterized by healthy appearing mucosa. - The examined portion of the ileum was normal. - One diminutive polyp in the proximal transverse colon, removed with a cold biopsy forceps. Resected and retrieved. - Likely malignant partially obstructing tumor in the mid rectum. Biopsied. Tattooed. - The examination was otherwise normal on direct and retroflexion views.   06/15/2020 Initial Biopsy   Diagnosis 1. Transverse Colon Polyp - HYPERPLASTIC POLYP 2. Rectum, biopsy - ADENOCARCINOMA ARISING IN A TUBULAR ADENOMA WITH HIGH-GRADE DYSPLASIA. SEE NOTE Diagnosis Note 2. Dr. [name] reviewed the case and concurs with the diagnosis. Dr. [name] was notified on 06/16/2020.   06/28/2020 Imaging   CT CAP  IMPRESSION: 1. New low-density focus in the anterior aspect of the lateral segment LEFT hepatic lobe measuring 1.2 x 1.0 cm, compatible with small metastatic lesion in the LEFT hepatic lobe. 2. Soft tissue in the RIGHT iliac fossa following RIGHT hemicolectomy invades the psoas musculature and is slowly enlarging over time, more linear on the prior study now highly concerning for recurrence/metastasis to this location. 3. Signs of enteritis, potentially post radiation changes of the small bowel. Tethered small bowel in the RIGHT lower quadrant shows focal thickening and narrowing suspicious for small  bowel involvement and developing partial obstruction though currently contrast passes beyond this point into the colon. 4. Rectal thickening in this patient with known rectal mass as described. 5. No evidence of metastatic disease in the chest. 6. Stable small pulmonary nodules. 7.  and aortic atherosclerosis.   Aortic Atherosclerosis (ICD10-I70.0) and Emphysema (ICD10-J43.9).   07/04/2020 Initial Diagnosis   Rectal cancer metastasized to liver ([place])   07/12/2020 PET scan   IMPRESSION: 1. Exam positive for FDG avid rectal tumor which corresponds to the recent colonoscopy findings. 2. FDG avid soft tissue mass within the right iliac fossa is noted and consistent with local tumor recurrence from previous ascending colon tumor. 3. Lateral segment left lobe of liver lesion is FDG avid concerning for liver metastasis. 4. No specific findings identified to suggest metastatic disease to the chest.   08/10/2020 -  Chemotherapy   First-line FOLFIRI q2weeks starting 08/10/20. dose reduced with cycle 1. Irinotecan/5FU increased and Bevacizumab added with cycle 2 on 08/23/2020    10/27/2020 Imaging   CT CAP  IMPRESSION: 1. Interval decrease in size of the hypermetabolic left hepatic lesion, consistent with metastatic disease. No new liver lesion evident. 2. Interval resolution of the hypermetabolic soft tissue lesion along the right iliac fossa with no measurable soft tissue lesion remaining at this location today. 3. Similar appearance of soft tissue fullness in the rectum at the site of the hypermetabolic lesion seen previously. 4. Stable tiny bilateral pulmonary nodules. Continued attention on follow-up recommended. 5. Small volume free fluid in the pelvis. 6. Aortic Atherosclerosis (ICD10-I70.0).  CURRENT THERAPY:  First-lineFOLFIRI q2weeks starting2/10/22.dose reduced with cycle 1. Bevacizumab added with cycle 2 on 08/23/2020  INTERVAL HISTORY:  Brittney Spencer is  here for a follow up. She was last seen by me 11/01/20. She presents to the clinic alone. She notes she is eating well overall but still losing a few pounds. She notes when she eats heavily she has diarrhea and when she does not eat enough she has constipation. She notes she is trying to figure out which foods and how much she can tolerate. She is willing to f/u with Dietician. She will only has occasional nausea, no vomiting. She notes no neuropathy but notes hair loss and skin darkening. I reviewed her medication list with her. She notes she is trying her best to cope with this diagnosis and treatment.  She notes she is considering taking a vacation this summer and would like chemo break. She will think about when.     REVIEW OF SYSTEMS:   Constitutional: Denies fevers, chills or abnormal weight loss Eyes: Denies blurriness of vision Ears, nose, mouth, throat, and face: Denies mucositis or sore throat Respiratory: Denies cough, dyspnea or wheezes Cardiovascular: Denies palpitation, chest discomfort or lower extremity swelling Gastrointestinal:  Denies nausea, heartburn or change in bowel habits Skin: Denies abnormal skin rashes Lymphatics: Denies new lymphadenopathy or easy bruising Neurological:Denies numbness, tingling or new weaknesses Behavioral/Psych: Mood is stable, no new changes  All other systems were reviewed with the patient and are negative.  MEDICAL HISTORY:  Past Medical History:  Diagnosis Date  . AAA (abdominal aortic aneurysm) ([facility])    infrarenal 4.1 cmper s-9-19 scan on chart  . Anemia    hx of  . Anxiety    has PRN meds  . Colon cancer ([place]) 2017   RIGHT hemi colectomy-s/p sx  . GERD (gastroesophageal reflux disease)    OTC meds/diet control  . Hypertension    on meds  . Macular pucker, right eye 10/06/2019  . Retinal detachment, right 09/2019  . Vitamin D deficiency     SURGICAL HISTORY: Past Surgical History:  Procedure Laterality Date  . COLONOSCOPY  2018    [facility]  . COLONSCOPY  12/2015  . IR IMAGING GUIDED PORT INSERTION  08/04/2020  . LAPAROSCOPIC RIGHT HEMI COLECTOMY Right 02/07/2016   Procedure: LAPAROSCOPIC ASSISTED RIGHT HEMI COLECTOMY AND RIGHT SALPINGO OOPHERECTOMY;  Surgeon: [name], MD;  Location: [facility];  Service: General;  Laterality: Right;  . RETINAL DETACHMENT SURGERY  09/2019    I have reviewed the social history and family history with the patient and they are unchanged from previous note.  ALLERGIES:  is allergic to fish allergy, peanut-containing drug products, soy allergy, and buspirone.  MEDICATIONS:  Current Outpatient Medications  Medication Sig Dispense Refill  . ALPRAZolam (XANAX) 0.25 MG tablet Take 1 tablet (0.25 mg total) by mouth daily as needed for anxiety. 30 tablet 0  . Cholecalciferol (VITAMIN D3 PO) Take by mouth daily.    . diphenoxylate-atropine (LOMOTIL) 2.5-0.025 MG tablet Take 2 tablets by mouth 4 (four) times daily as needed for diarrhea or loose stools. 45 tablet 3  . docusate sodium (COLACE) 100 MG capsule 1 capsule as needed    . lidocaine-prilocaine (EMLA) cream Apply 1 application topically as needed. 30 g 1  . NORVASC 2.5 MG tablet TAKE 1 TABLET BY MOUTH EVERY DAY 90 tablet 1  . ondansetron (ZOFRAN) 8 MG tablet Take 1 tablet (8 mg total) by mouth every 8 (eight) hours as  needed for nausea or vomiting. 20 tablet 2  . potassium chloride (KLOR-CON) 10 MEQ tablet Take 1 tablet (10 mEq total) by mouth daily. 10 tablet 0  . prochlorperazine (COMPAZINE) 10 MG tablet Take 1 tablet (10 mg total) by mouth every 6 (six) hours as needed for nausea or vomiting. 30 tablet 2   Current Facility-Administered Medications  Medication Dose Route Frequency Provider Last Rate Last Admin  . 0.9 %  sodium chloride infusion  500 mL Intravenous Once Doran Stabler, MD       Facility-Administered Medications Ordered in Other Visits  Medication Dose Route Frequency Provider Last [place]  .  fluorouracil (ADRUCIL) 3,000 mg in sodium chloride 0.9 % 90 mL chemo infusion  3,000 mg Intravenous 1 day or 1 dose [name], MD      . heparin lock flush 100 unit/mL  500 Units Intracatheter Once PRN [name], MD      . irinotecan (CAMPTOSAR) 180 mg in sodium chloride 0.9 % 500 mL chemo infusion  180 mg Intravenous Once [name], MD 339 mL/hr at 11/15/20 1205 180 mg at 11/15/20 1205  . leucovorin 556 mg in sodium chloride 0.9 % 250 mL infusion  556 mg Intravenous Once [name], MD 185 mL/hr at 11/15/20 1207 556 mg at 11/15/20 1207  . sodium chloride flush (NS) 0.9 % injection 10 mL  10 mL Intracatheter PRN [name], MD        PHYSICAL EXAMINATION: ECOG PERFORMANCE STATUS: 1 - Symptomatic but completely ambulatory  Vitals:   11/15/20 0941  BP: 120/79  Pulse: 81  Resp: 17  Temp: 97.8 F (36.6 C)  SpO2: 100%   Filed Weights   11/15/20 0941  Weight: 96 lb 6.4 oz (43.7 kg)    GENERAL:alert, no distress and comfortable SKIN: skin color, texture, turgor are normal, no rashes or significant lesions EYES: normal, Conjunctiva are pink and non-injected, sclera clear  NECK: supple, thyroid normal size, non-tender, without nodularity LYMPH:  no palpable lymphadenopathy in the cervical, axillary  LUNGS: clear to auscultation and percussion with normal breathing effort HEART: regular rate & rhythm and no murmurs and no lower extremity edema ABDOMEN:abdomen soft, non-tender and normal bowel sounds Musculoskeletal:no cyanosis of digits and no clubbing  NEURO: alert & oriented x 3 with fluent speech, no focal motor/sensory deficits  LABORATORY DATA:  I have reviewed the data as listed CBC Latest Ref Rng & Units 11/15/2020 11/01/2020 10/19/2020  WBC 4.0 - 10.5 K/uL 7.4 8.2 11.5(H)  Hemoglobin 12.0 - 15.0 g/dL 9.2(L) 9.0(L) 9.2(L)  Hematocrit 36.0 - 46.0 % 29.3(L) 29.5(L) 29.6(L)  Platelets 150 - 400 K/uL 207 215 248     CMP Latest Ref Rng & Units 11/15/2020 11/01/2020 10/19/2020  Glucose 70  - 99 mg/dL 82 84 79  BUN 8 - 23 mg/dL _0 Creatinine 0.44 - 1.00 mg/dL 0.73 0.77 0.78  Sodium 135 - 145 mmol/L 138 141 141  Potassium 3.5 - 5.1 mmol/L 3.4(L) 3.7 3.6  Chloride 98 - 111 mmol/L 102 105 104  CO2 22 - 32 mmol/L _1 Calcium 8.9 - 10.3 mg/dL 9.1 8.7(L) 8.9  Total Protein 6.5 - 8.1 g/dL 6.8 6.6 6.6  Total Bilirubin 0.3 - 1.2 mg/dL 0.3 0.2(L) <0.2(L)  Alkaline Phos 38 - 126 U/L 89 77 82  AST 15 - 41 U/L 13(L) 13(L) 13(L)  ALT 0 - 44 U/L 7 6 <6      RADIOGRAPHIC STUDIES:  I have personally reviewed the radiological images as listed and agreed with the findings in the report. No results found.   ASSESSMENT & PLAN:  RHYLIN VENTERS is a 70 y.o. female with   1. Rectal adenocarcinoma, withliverand right pelvic metastasis, recurrence from previous colon cancer vs new primary -Diagnosed on routine screening colonoscopy12/2021, rectal mass biopsy showed invasive adenocarcinoma, arising from a tubular adenoma -Her 1/12/22PETshowedpositive uptake of known rectal tumor and soft tissue mass within the right iliac fossa indicating recurrent prior colon cancer. There is alsoleft lobe of liver lesion is FDG avid concerning for liver metastasis.pt declined liver biopsy  -Whether this is metastatic rectal or recurrent/metastatic colon cancer, this is likely not curable, but still treatable. She previously declined referral for HIPEC -She began first-line systemic chemo with dose-reduced FOLFIRI on2/6/22. Bevacizumabadded, and irinotecan/5FU increased with cycle 2, but due to poor tolerance/diarrhea irinotecan was dose reduced with cycle 3 and she has been tolerating well. -Her CEA and 10/27/20 CT CAP has shown good response.  -S/p 7 cycle chemo, she continues to have change in BM and diet. She still losing a few pounds at a time. I reviewed management with her. She has mild skin toxicity without neuropathy. She will f/u with dietician.  -Labs reviewed, Hg 9.2, K 3.4,  albumin 3.4. Overall adequate to proceed with C8 FOLFIRI and Beva at same dose.  -F/u in 2 weeks.   2. Cancer of ascending colon, pT4bN1bM0, stage IIIC, MSI-stable, (+) surgical margins at pelvic wall -She was diagnosed in 12/2015. She is s/p right hemicolectomy with rightsalpingo oophorectomyand adjuvant ChemoRT.  -she declined adjuvant chemodue to the concern of side effects and impact on her quality of life. -Her 04/2019 CT scan was NED -With recent rectal cancer diagnosis, herPET from 07/12/20 indicates soft tissue mass within the right iliac fossa is noted and consistent with local tumor recurrence from previous ascendingcolon tumor.  -Shepreviouslydeclined option of liver biopsy.   3. Mild Anemia  -Has been stable and mild for the past year. -worse lately due to her newly diagnosed rectal cancerand starting chemo. Denies overt GI bleeding.  -Moderate and stable lately.   4. HTN, Anxiety -She'll follow-up with her primary care physician and continue medication -She uses Xanax as needed. She is stable.  -She continues to cope with her diagnosis and treatment.   5. Hypokalemia  -She notes she is not taking K supplement. She has increased potassium in diet.    Plan:  -Labs reviewed and adequate to proceed with C8 FOLFIRI and Beva today at same dose.  -Lab, flush, F/u and FOLFIRI and Beva in 2, 4, 6 weeks  -she will see dietician today    No problem-specific Assessment & Plan notes found for this encounter.   No orders of the defined types were placed in this encounter.  All questions were answered. The patient knows to call the clinic with any problems, questions or concerns. No barriers to learning was detected. The total time spent in the appointment was 30 minutes.     [name], MD 11/15/2020   I, Joslyn Devon, am acting as scribe for [name], MD.   I have reviewed the above documentation for accuracy and completeness, and I agree with the  above.

## 2020-11-15 ENCOUNTER — Other Ambulatory Visit: Payer: Self-pay

## 2020-11-15 ENCOUNTER — Encounter: Payer: Self-pay | Admitting: Hematology

## 2020-11-15 ENCOUNTER — Other Ambulatory Visit: Payer: Federal, State, Local not specified - PPO

## 2020-11-15 ENCOUNTER — Inpatient Hospital Stay: Payer: Federal, State, Local not specified - PPO

## 2020-11-15 ENCOUNTER — Inpatient Hospital Stay: Payer: Federal, State, Local not specified - PPO | Admitting: Hematology

## 2020-11-15 ENCOUNTER — Inpatient Hospital Stay: Payer: Federal, State, Local not specified - PPO | Admitting: Nutrition

## 2020-11-15 VITALS — BP 120/79 | HR 81 | Temp 97.8°F | Resp 17 | Wt 96.4 lb

## 2020-11-15 DIAGNOSIS — I10 Essential (primary) hypertension: Secondary | ICD-10-CM | POA: Diagnosis not present

## 2020-11-15 DIAGNOSIS — Z95828 Presence of other vascular implants and grafts: Secondary | ICD-10-CM

## 2020-11-15 DIAGNOSIS — Z5112 Encounter for antineoplastic immunotherapy: Secondary | ICD-10-CM | POA: Diagnosis not present

## 2020-11-15 DIAGNOSIS — C182 Malignant neoplasm of ascending colon: Secondary | ICD-10-CM

## 2020-11-15 DIAGNOSIS — C787 Secondary malignant neoplasm of liver and intrahepatic bile duct: Secondary | ICD-10-CM

## 2020-11-15 DIAGNOSIS — C20 Malignant neoplasm of rectum: Secondary | ICD-10-CM

## 2020-11-15 DIAGNOSIS — D500 Iron deficiency anemia secondary to blood loss (chronic): Secondary | ICD-10-CM | POA: Diagnosis not present

## 2020-11-15 LAB — CBC WITH DIFFERENTIAL (CANCER CENTER ONLY)
Abs Immature Granulocytes: 0.12 10*3/uL — ABNORMAL HIGH (ref 0.00–0.07)
Basophils Absolute: 0 10*3/uL (ref 0.0–0.1)
Basophils Relative: 1 %
Eosinophils Absolute: 0.1 10*3/uL (ref 0.0–0.5)
Eosinophils Relative: 2 %
HCT: 29.3 % — ABNORMAL LOW (ref 36.0–46.0)
Hemoglobin: 9.2 g/dL — ABNORMAL LOW (ref 12.0–15.0)
Immature Granulocytes: 2 %
Lymphocytes Relative: 15 %
Lymphs Abs: 1.1 10*3/uL (ref 0.7–4.0)
MCH: 26.3 pg (ref 26.0–34.0)
MCHC: 31.4 g/dL (ref 30.0–36.0)
MCV: 83.7 fL (ref 80.0–100.0)
Monocytes Absolute: 0.6 10*3/uL (ref 0.1–1.0)
Monocytes Relative: 8 %
Neutro Abs: 5.4 10*3/uL (ref 1.7–7.7)
Neutrophils Relative %: 72 %
Platelet Count: 207 10*3/uL (ref 150–400)
RBC: 3.5 MIL/uL — ABNORMAL LOW (ref 3.87–5.11)
RDW: 15.5 % (ref 11.5–15.5)
WBC Count: 7.4 10*3/uL (ref 4.0–10.5)
nRBC: 0 % (ref 0.0–0.2)

## 2020-11-15 LAB — CMP (CANCER CENTER ONLY)
ALT: 7 U/L (ref 0–44)
AST: 13 U/L — ABNORMAL LOW (ref 15–41)
Albumin: 3.4 g/dL — ABNORMAL LOW (ref 3.5–5.0)
Alkaline Phosphatase: 89 U/L (ref 38–126)
Anion gap: 10 (ref 5–15)
BUN: 8 mg/dL (ref 8–23)
CO2: 26 mmol/L (ref 22–32)
Calcium: 9.1 mg/dL (ref 8.9–10.3)
Chloride: 102 mmol/L (ref 98–111)
Creatinine: 0.73 mg/dL (ref 0.44–1.00)
GFR, Estimated: >60 mL/min (ref >60)
Glucose, Bld: 82 mg/dL (ref 70–99)
Potassium: 3.4 mmol/L — ABNORMAL LOW (ref 3.5–5.1)
Sodium: 138 mmol/L (ref 135–145)
Total Bilirubin: 0.3 mg/dL (ref 0.3–1.2)
Total Protein: 6.8 g/dL (ref 6.5–8.1)

## 2020-11-15 LAB — TOTAL PROTEIN, URINE DIPSTICK: Protein, ur: NEGATIVE mg/dL

## 2020-11-15 LAB — CEA (IN HOUSE-CHCC): CEA (CHCC-In House): 4.48 ng/mL (ref 0.00–5.00)

## 2020-11-15 MED ORDER — SODIUM CHLORIDE 0.9 % IV SOLN
250.0000 mg | Freq: Once | INTRAVENOUS | Status: AC
  Administered 2020-11-15: 250 mg via INTRAVENOUS
  Filled 2020-11-15: qty 10

## 2020-11-15 MED ORDER — PALONOSETRON HCL INJECTION 0.25 MG/5ML
INTRAVENOUS | Status: AC
  Filled 2020-11-15: qty 5

## 2020-11-15 MED ORDER — SODIUM CHLORIDE 0.9 % IV SOLN
180.0000 mg | Freq: Once | INTRAVENOUS | Status: AC
  Administered 2020-11-15: 180 mg via INTRAVENOUS
  Filled 2020-11-15: qty 9

## 2020-11-15 MED ORDER — SODIUM CHLORIDE 0.9% FLUSH
10.0000 mL | Freq: Once | INTRAVENOUS | Status: AC
  Administered 2020-11-15: 10 mL
  Filled 2020-11-15: qty 10

## 2020-11-15 MED ORDER — ATROPINE SULFATE 1 MG/ML IJ SOLN
INTRAMUSCULAR | Status: AC
  Filled 2020-11-15: qty 1

## 2020-11-15 MED ORDER — DEXAMETHASONE SODIUM PHOSPHATE 100 MG/10ML IJ SOLN
10.0000 mg | Freq: Once | INTRAMUSCULAR | Status: AC
  Administered 2020-11-15: 10 mg via INTRAVENOUS
  Filled 2020-11-15: qty 10

## 2020-11-15 MED ORDER — HEPARIN SOD (PORK) LOCK FLUSH 100 UNIT/ML IV SOLN
500.0000 [IU] | Freq: Once | INTRAVENOUS | Status: DC | PRN
  Filled 2020-11-15: qty 5

## 2020-11-15 MED ORDER — ATROPINE SULFATE 1 MG/ML IJ SOLN
0.5000 mg | Freq: Once | INTRAMUSCULAR | Status: AC | PRN
  Administered 2020-11-15: 0.5 mg via INTRAVENOUS

## 2020-11-15 MED ORDER — PALONOSETRON HCL INJECTION 0.25 MG/5ML
0.2500 mg | Freq: Once | INTRAVENOUS | Status: AC
  Administered 2020-11-15: 0.25 mg via INTRAVENOUS

## 2020-11-15 MED ORDER — SODIUM CHLORIDE 0.9 % IV SOLN
Freq: Once | INTRAVENOUS | Status: AC
Start: 2020-11-15 — End: 2020-11-15
  Filled 2020-11-15: qty 250

## 2020-11-15 MED ORDER — SODIUM CHLORIDE 0.9% FLUSH
10.0000 mL | INTRAVENOUS | Status: DC | PRN
  Filled 2020-11-15: qty 10

## 2020-11-15 MED ORDER — SODIUM CHLORIDE 0.9 % IV SOLN
556.0000 mg | Freq: Once | INTRAVENOUS | Status: AC
  Administered 2020-11-15: 556 mg via INTRAVENOUS
  Filled 2020-11-15: qty 27.8

## 2020-11-15 MED ORDER — SODIUM CHLORIDE 0.9 % IV SOLN
3000.0000 mg | INTRAVENOUS | Status: DC
  Administered 2020-11-15: 3000 mg via INTRAVENOUS
  Filled 2020-11-15: qty 60

## 2020-11-15 NOTE — Progress Notes (Signed)
Nutrition follow-up completed with patient during infusion for metastatic rectal cancer. Weight continues to drop and was documented as 96.4 pounds May 17 down from 105.6 pounds March 23. Patient reports she continues to limit her diet but has been adding foods such as pumpkin muffins and more cereal.  Her bowels continue to be erratic fluctuating between diarrhea and constipation depending on how much and what she eats.  She has consumed grits with egg whites and applesauce this morning for breakfast.  She has been drinking original boost.  She is interested in further information on diet advancement.  Nutrition diagnosis: Food and nutrition related knowledge deficit continues.  Intervention: Continue increased calories and protein every 2-3 hours based on tolerance. Continue to add new foods slowly over several days in small amounts. Provided a handout on fiber for bowel management. Recommended change oral nutrition supplements to boost plus to provide 360 cal and 14 g of protein per carton.  Try to drink these between meals as tolerated.  Provided coupons.  Monitoring, evaluation, goals: Patient will tolerate increased calories and protein to minimize weight loss.  Next visit: To be scheduled as needed.  Patient has contact information for follow-up as needed.  **Disclaimer: This note was dictated with voice recognition software. Similar sounding words can inadvertently be transcribed and this note may contain transcription errors which may not have been corrected upon publication of note.**

## 2020-11-15 NOTE — Patient Instructions (Signed)
[facility] MEDICAL [facility]COLOGY  Discharge Instructions: Thank you for choosing [facility] to provide your oncology and hematology care.   If you have a lab appointment with the Cancer Center, please go directly to the Cancer Center and check in at the registration area.   Wear comfortable clothing and clothing appropriate for easy access to any Portacath or PICC line.   We strive to give you quality time with your provider. You may need to reschedule your appointment if you arrive late (15 or more minutes).  Arriving late affects you and other patients whose appointments are after yours.  Also, if you miss three or more appointments without notifying the office, you may be dismissed from the clinic at the provider's discretion.      For prescription refill requests, have your pharmacy contact our office and allow 72 hours for refills to be completed.    Today you received the following chemotherapy and/or immunotherapy agents   To help prevent nausea and vomiting after your treatment, we encourage you to take your nausea medication as directed.  BELOW ARE SYMPTOMS THAT SHOULD BE REPORTED IMMEDIATELY: *FEVER GREATER THAN 100.4 F (38 C) OR HIGHER *CHILLS OR SWEATING *NAUSEA AND VOMITING THAT IS NOT CONTROLLED WITH YOUR NAUSEA MEDICATION *UNUSUAL SHORTNESS OF BREATH *UNUSUAL BRUISING OR BLEEDING *URINARY PROBLEMS (pain or burning when urinating, or frequent urination) *BOWEL PROBLEMS (unusual diarrhea, constipation, pain near the anus) TENDERNESS IN MOUTH AND THROAT WITH OR WITHOUT PRESENCE OF ULCERS (sore throat, sores in mouth, or a toothache) UNUSUAL RASH, SWELLING OR PAIN  UNUSUAL VAGINAL DISCHARGE OR ITCHING   Items with * indicate a potential emergency and should be followed up as soon as possible or go to the Emergency Department if any problems should occur.  Please show the CHEMOTHERAPY ALERT CARD or IMMUNOTHERAPY ALERT CARD at check-in to the Emergency  Department and triage nurse.  Should you have questions after your visit or need to cancel or reschedule your appointment, please contact [facility]  Dept: [phone]  and follow the prompts.  Office hours are 8:00 a.m. to 4:30 p.m. Monday - Friday. Please note that voicemails left after 4:00 p.m. may not be returned until the following business day.  We are closed weekends and major holidays. You have access to a nurse at all times for urgent questions. Please call the main number to the clinic Dept: [phone] and follow the prompts.   For any non-urgent questions, you may also contact your provider using MyChart. We now offer e-Visits for anyone 18 and older to request care online for non-urgent symptoms. For details visit [email or web address].   Also download the MyChart app! Go to the app store, search "MyChart", open the app, select , and log in with your MyChart username and password.  Due to Covid, a mask is required upon entering the hospital/clinic. If you do not have a mask, one will be given to you upon arrival. For doctor visits, patients may have 1 support person aged 18 or older with them. For treatment visits, patients cannot have anyone with them due to current Covid guidelines and our immunocompromised population.   

## 2020-11-16 ENCOUNTER — Other Ambulatory Visit: Payer: Self-pay

## 2020-11-16 ENCOUNTER — Emergency Department (HOSPITAL_COMMUNITY)
Admission: EM | Admit: 2020-11-16 | Discharge: 2020-11-16 | Disposition: A | Discharge status: Home / Self Care | Payer: Federal, State, Local not specified - PPO | Attending: Emergency Medicine | Admitting: Emergency Medicine

## 2020-11-16 ENCOUNTER — Encounter (HOSPITAL_COMMUNITY): Payer: Self-pay

## 2020-11-16 DIAGNOSIS — Z5321 Procedure and treatment not carried out due to patient leaving prior to being seen by health care provider: Secondary | ICD-10-CM | POA: Diagnosis not present

## 2020-11-16 DIAGNOSIS — T82594A Other mechanical complication of infusion catheter, initial encounter: Secondary | ICD-10-CM | POA: Insufficient documentation

## 2020-11-16 DIAGNOSIS — Y828 Other medical devices associated with adverse incidents: Secondary | ICD-10-CM | POA: Diagnosis not present

## 2020-11-16 NOTE — ED Triage Notes (Signed)
Beeper for port going off and she called infusion number and they told her to come to ER to get it checked.  Pt has an appointment in the morning at 11am at cancer center, but was told to come here first to get port checked.  Pt just had port implanted on Wednesday.

## 2020-11-16 NOTE — ED Provider Notes (Signed)
Emergency Medicine Provider Triage Evaluation Note  Brittney Spencer , a 70 y.o. female  was evaluated in triage.  Pt complains of abnormality to port. She supposed to be doing infusions.  Stated earlier this evening the pump started beeping.  She called infusion number who told her to come to the emergency department for evaluation.  Patient denies any pain, redness, swelling, warmth to site.  She was told to take the batteries out.  Review of Systems  Positive: Port electronics alarming Negative: Redness, swelling, warmth support  Physical Exam  BP 124/67 (BP Location: Left Arm)   Pulse 77   Temp 98.1 F (36.7 C) (Oral)   Resp 18   Ht 5\' 6"  (1.676 m)   Wt 45.4 kg   SpO2 100%   BMI 16.14 kg/m  Gen:   Awake, no distress   Resp:  Normal effort  MSK:   Moves extremities without difficulty  Other:  Port site currently accessed to right upper chest wall.  No surrounding erythema, warmth or drainage  Medical Decision Making  Medically screening exam initiated at 9:48 PM.  Appropriate orders placed.  Brittney Spencer was informed that the remainder of the evaluation will be completed by another provider, this initial triage assessment does not replace that evaluation, and the importance of remaining in the ED until their evaluation is complete.  Port abnormality   Brittney Spencer 11/16/20 2151    Little, Wenda Overland, MD 11/16/20 2246

## 2020-11-17 ENCOUNTER — Inpatient Hospital Stay: Payer: Federal, State, Local not specified - PPO

## 2020-11-17 ENCOUNTER — Telehealth: Payer: Self-pay | Admitting: Hematology

## 2020-11-17 ENCOUNTER — Ambulatory Visit: Payer: Self-pay

## 2020-11-17 VITALS — BP 126/76 | HR 72 | Temp 98.9°F | Resp 18

## 2020-11-17 DIAGNOSIS — C787 Secondary malignant neoplasm of liver and intrahepatic bile duct: Secondary | ICD-10-CM

## 2020-11-17 DIAGNOSIS — C20 Malignant neoplasm of rectum: Secondary | ICD-10-CM

## 2020-11-17 MED ORDER — PEGFILGRASTIM-JMDB 6 MG/0.6ML ~~LOC~~ SOSY
PREFILLED_SYRINGE | SUBCUTANEOUS | Status: AC
  Filled 2020-11-17: qty 0.6

## 2020-11-17 MED ORDER — HEPARIN SOD (PORK) LOCK FLUSH 100 UNIT/ML IV SOLN
500.0000 [IU] | Freq: Once | INTRAVENOUS | Status: DC | PRN
  Filled 2020-11-17: qty 5

## 2020-11-17 MED ORDER — SODIUM CHLORIDE 0.9% FLUSH
10.0000 mL | INTRAVENOUS | Status: DC | PRN
  Filled 2020-11-17: qty 10

## 2020-11-17 MED ORDER — PEGFILGRASTIM-JMDB 6 MG/0.6ML ~~LOC~~ SOSY: 6.0000 mg | PREFILLED_SYRINGE | Freq: Once | SUBCUTANEOUS | Status: DC

## 2020-11-17 NOTE — Progress Notes (Signed)
Patient presented to treatment after home infusion pump began beeping. She states she attempted to troubleshoot with no success. Infusion pump indicates 55 mL of fluorouracil remaining in bag. Huber needle flushed with NS and + blood return noted. PAC dressing clean/dry/intact. Pump set to administer remaining 55 mL of medication over 22 hours per Dr. Ernestina Penna direction (2.5 mL/hr). Pump settings verified by Laurence Compton, RN. Instructed patient to call clinic if issues arise, again. Verbalized understanding. Patient will return to clinic on 11/17/2020 @ 0900 for pump d/c and GCSF injection.

## 2020-11-17 NOTE — Telephone Encounter (Signed)
Scheduled per los. Called and left msg. Mailed printout  °

## 2020-11-18 ENCOUNTER — Other Ambulatory Visit: Payer: Self-pay

## 2020-11-18 ENCOUNTER — Inpatient Hospital Stay: Payer: Federal, State, Local not specified - PPO

## 2020-11-18 VITALS — BP 107/73 | HR 97 | Temp 97.2°F | Resp 17

## 2020-11-18 DIAGNOSIS — Z5112 Encounter for antineoplastic immunotherapy: Secondary | ICD-10-CM | POA: Diagnosis not present

## 2020-11-18 DIAGNOSIS — C787 Secondary malignant neoplasm of liver and intrahepatic bile duct: Secondary | ICD-10-CM

## 2020-11-18 DIAGNOSIS — C20 Malignant neoplasm of rectum: Secondary | ICD-10-CM

## 2020-11-18 MED ORDER — PEGFILGRASTIM-JMDB 6 MG/0.6ML ~~LOC~~ SOSY
PREFILLED_SYRINGE | SUBCUTANEOUS | Status: AC
  Filled 2020-11-18: qty 0.6

## 2020-11-18 MED ORDER — SODIUM CHLORIDE 0.9% FLUSH
10.0000 mL | INTRAVENOUS | Status: DC | PRN
  Administered 2020-11-18: 10 mL
  Filled 2020-11-18: qty 10

## 2020-11-18 MED ORDER — PEGFILGRASTIM-JMDB 6 MG/0.6ML ~~LOC~~ SOSY
6.0000 mg | PREFILLED_SYRINGE | Freq: Once | SUBCUTANEOUS | Status: AC
  Administered 2020-11-18: 6 mg via SUBCUTANEOUS

## 2020-11-18 MED ORDER — HEPARIN SOD (PORK) LOCK FLUSH 100 UNIT/ML IV SOLN
500.0000 [IU] | Freq: Once | INTRAVENOUS | Status: AC | PRN
  Administered 2020-11-18: 500 [IU]
  Filled 2020-11-18: qty 5

## 2020-11-24 NOTE — Progress Notes (Signed)
Brittney Spencer   [phone] Fax:([phone]) [phone]   Clinic Follow up Note   Patient Care Team: Patient, No Pcp Per (Inactive) as PCP - General (General Practice) [name], NP as Nurse Practitioner (Oncology) [name], MD as Consulting Physician (Oncology) [name], RN as Oncology Nurse Navigator  Date of Service:  11/29/2020  CHIEF COMPLAINT: f/u ofH/o colon cancer and recently diagnosed metastatic rectal cancer  SUMMARY OF ONCOLOGIC HISTORY: Oncology History Overview Note  Cancer Staging Cancer of ascending colon [facility]) Staging form: Colon and Rectum, AJCC 7th Edition - Clinical stage from 02/07/2016: Stage IIIC (T4b, N1b, M0) - Signed by [name], MD on 03/04/2016    Cancer of ascending colon ([place])  10/25/2015 Imaging   CT ABD/PELVIS:  Inflammatory changes inferior to the cecal tip appear improved, there is still irregular soft tissue thickening of the cecal tip, and there are adjacent prominent lymph nodes in the ileocolonic mesentery, measuring 13 mm on image 49 and 8 mm on image 52. In addition, there is a 2.5 x 1.8 cm nodule on image 46 which has central low density. Therefore, these findings are moderately suspicious for an underlying cecal malignancy with perforation.    01/19/2016 Procedure   COLONOSCOPY per Dr. [name]: Fungating, ulcerated mass almost obstructing mid ascending colon   01/19/2016 Initial Biopsy   Diagnosis Surgical [P], cecal mass - INVASIVE ADENOCARCINOMA WITH ULCERATION. - SEE COMMENT.   02/05/2016 Tumor Marker   Patient's tumor was tested for the following markers: CEA Results of the tumor marker test revealed 5.7.   02/07/2016 Initial Diagnosis   Cancer of ascending colon ([facility])   02/07/2016 Definitive Surgery   Laparoscopic assisted right hemicolectomy and right salpingo oopherectomy--Dr. [name]   02/07/2016 Pathologic Stage   p T4 N1b   2/43 nodes +   02/07/2016 Pathology Results   MMR normal; G2  adenocarcinoma;proximal & distal margins negative; soft tissue mass on pelvic sidewall + for adenocarcinoma with positive margin MSI Stable   03/08/2016 Imaging   CT chest negative for metastasis.    03/19/2016 - 04/25/2016 Radiation Therapy   Adjuvant irradiation, 50 gray in 28 fractions   03/19/2016 - 04/22/2016 Chemotherapy   Xeloda 1500 mg twice daily, started on 03/19/2016, dose reduced to 1000 mg twice daily from week 3 due to neutropenia, and patient stopped 3 days before last dose radiation due to difficulty swallowing the pill    05/20/2016 -  Adjuvant Chemotherapy   Patient declined adjuvant chemotherapy   09/16/2016 Imaging   CT CAP w Contrast 1. No evidence of local tumor recurrence at the ileocolic anastomosis. 2. No findings suspicious for metastatic disease in the chest, abdomen or pelvis. 3. Nonspecific trace free fluid in the pelvic cul-de-sac. 4. Stable solitary 3 mm right upper lobe pulmonary nodule, for which 6 month stability has been demonstrated, probably benign. 5. Additional findings include stable right posterior pericardial cyst and small calcified uterine fibroids.   05/13/2017 Imaging   CT CAP W Contrast 05/13/17 IMPRESSION: 1. No current findings of residual or recurrent malignancy. 2. Mild prominence of stool throughout the colon. Nondistended portions of the rectum. 3. Several tiny pulmonary nodules are stable from the earliest available comparison of 03/08/2016 and probably benign, but may merit surveillance. 4. Other imaging findings of potential clinical significance: Old granulomatous disease. Aortoiliac atherosclerotic vascular disease. Lumbar spondylosis and degenerative disc disease. Stable amount of trace free pelvic fluid.   04/27/2018 Imaging   04/27/2018 CT CAP IMPRESSION: Stable  exam. No evidence of recurrent or metastatic carcinoma within the chest, abdomen, or pelvis   04/19/2019 Imaging   CT CAP W Contrast  IMPRESSION: Chest  Impression:   1. No evidence of thoracic metastasis. 2. Stable small bilateral pulmonary nodules.   Abdomen / Pelvis Impression:   1. No evidence local colorectal carcinoma recurrence or metastasis in the abdomen pelvis. 2. Post RIGHT hemicolectomy.   Rectal cancer metastasized to liver ([place])  06/15/2020 Procedure   Screening Colonoscopy by Dr [name]  IMPRESSION - Decreased sphincter tone and internal hemorrhoids that prolapse with straining, but require manual replacement into the anal canal (Grade III) found on digital rectal exam. - Patent side-to-side ileo-colonic anastomosis, characterized by healthy appearing mucosa. - The examined portion of the ileum was normal. - One diminutive polyp in the proximal transverse colon, removed with a cold biopsy forceps. Resected and retrieved. - Likely malignant partially obstructing tumor in the mid rectum. Biopsied. Tattooed. - The examination was otherwise normal on direct and retroflexion views.   06/15/2020 Initial Biopsy   Diagnosis 1. Transverse Colon Polyp - HYPERPLASTIC POLYP 2. Rectum, biopsy - ADENOCARCINOMA ARISING IN A TUBULAR ADENOMA WITH HIGH-GRADE DYSPLASIA. SEE NOTE Diagnosis Note 2. Dr. [name] reviewed the case and concurs with the diagnosis. Dr. [name] was notified on 06/16/2020.   06/28/2020 Imaging   CT CAP  IMPRESSION: 1. New low-density focus in the anterior aspect of the lateral segment LEFT hepatic lobe measuring 1.2 x 1.0 cm, compatible with small metastatic lesion in the LEFT hepatic lobe. 2. Soft tissue in the RIGHT iliac fossa following RIGHT hemicolectomy invades the psoas musculature and is slowly enlarging over time, more linear on the prior study now highly concerning for recurrence/metastasis to this location. 3. Signs of enteritis, potentially post radiation changes of the small bowel. Tethered small bowel in the RIGHT lower quadrant shows focal thickening and narrowing suspicious for small  bowel involvement and developing partial obstruction though currently contrast passes beyond this point into the colon. 4. Rectal thickening in this patient with known rectal mass as described. 5. No evidence of metastatic disease in the chest. 6. Stable small pulmonary nodules. 7.  and aortic atherosclerosis.   Aortic Atherosclerosis (ICD10-I70.0) and Emphysema (ICD10-J43.9).   07/04/2020 Initial Diagnosis   Rectal cancer metastasized to liver ([place])   07/12/2020 PET scan   IMPRESSION: 1. Exam positive for FDG avid rectal tumor which corresponds to the recent colonoscopy findings. 2. FDG avid soft tissue mass within the right iliac fossa is noted and consistent with local tumor recurrence from previous ascending colon tumor. 3. Lateral segment left lobe of liver lesion is FDG avid concerning for liver metastasis. 4. No specific findings identified to suggest metastatic disease to the chest.   08/10/2020 -  Chemotherapy   First-line FOLFIRI q2weeks starting 08/10/20. dose reduced with cycle 1. Irinotecan/5FU increased and Bevacizumab added with cycle 2 on 08/23/2020    10/27/2020 Imaging   CT CAP  IMPRESSION: 1. Interval decrease in size of the hypermetabolic left hepatic lesion, consistent with metastatic disease. No new liver lesion evident. 2. Interval resolution of the hypermetabolic soft tissue lesion along the right iliac fossa with no measurable soft tissue lesion remaining at this location today. 3. Similar appearance of soft tissue fullness in the rectum at the site of the hypermetabolic lesion seen previously. 4. Stable tiny bilateral pulmonary nodules. Continued attention on follow-up recommended. 5. Small volume free fluid in the pelvis. 6. Aortic Atherosclerosis (ICD10-I70.0).  CURRENT THERAPY:  First-lineFOLFIRI q2weeks starting2/10/22.dose reduced with cycle 1. Bevacizumab added with cycle 2 on 08/23/2020  INTERVAL HISTORY:  Brittney Spencer is  here for a follow up. She was last seen by me 11/15/20. She presents to the clinic alone. She notes with her last cycle her pump started to malfunction. She had to take batteries out and stop pump early. She was able to complete the next day. She notes she is eating more with alteration in her diet. She was able to gain 1 pound. She also has adequate energy to be more active at home. I reviewed her medication list with her. She notes she is coping with her stress well.   She notes the whole first week in August she plans to participate in active event.   REVIEW OF SYSTEMS:   Constitutional: Denies fevers, chills or abnormal weight loss Eyes: Denies blurriness of vision Ears, nose, mouth, throat, and face: Denies mucositis or sore throat Respiratory: Denies cough, dyspnea or wheezes Cardiovascular: Denies palpitation, chest discomfort or lower extremity swelling Gastrointestinal:  Denies nausea, heartburn or change in bowel habits Skin: Denies abnormal skin rashes Lymphatics: Denies new lymphadenopathy or easy bruising Neurological:Denies numbness, tingling or new weaknesses Behavioral/Psych: Mood is stable, no new changes  All other systems were reviewed with the patient and are negative.  MEDICAL HISTORY:  Past Medical History:  Diagnosis Date  . AAA (abdominal aortic aneurysm) ([facility])    infrarenal 4.1 cmper s-9-19 scan on chart  . Anemia    hx of  . Anxiety    has PRN meds  . Colon cancer ([place]) 2017   RIGHT hemi colectomy-s/p sx  . GERD (gastroesophageal reflux disease)    OTC meds/diet control  . Hypertension    on meds  . Macular pucker, right eye 10/06/2019  . Retinal detachment, right 09/2019  . Vitamin D deficiency     SURGICAL HISTORY: Past Surgical History:  Procedure Laterality Date  . COLONOSCOPY  2018   [facility]-hams  . COLONSCOPY  12/2015  . IR IMAGING GUIDED PORT INSERTION  08/04/2020  . LAPAROSCOPIC RIGHT HEMI COLECTOMY Right 02/07/2016   Procedure: LAPAROSCOPIC ASSISTED  RIGHT HEMI COLECTOMY AND RIGHT SALPINGO OOPHERECTOMY;  Surgeon: [name], MD;  Location: [facility];  Service: General;  Laterality: Right;  . RETINAL DETACHMENT SURGERY  09/2019    I have reviewed the social history and family history with the patient and they are unchanged from previous note.  ALLERGIES:  is allergic to fish allergy, peanut-containing drug products, soy allergy, and buspirone.  MEDICATIONS:  Current Outpatient Medications  Medication Sig Dispense Refill  . ALPRAZolam (XANAX) 0.25 MG tablet Take 1 tablet (0.25 mg total) by mouth daily as needed for anxiety. 30 tablet 0  . Cholecalciferol (VITAMIN D3 PO) Take by mouth daily.    . diphenoxylate-atropine (LOMOTIL) 2.5-0.025 MG tablet Take 2 tablets by mouth 4 (four) times daily as needed for diarrhea or loose stools. 45 tablet 3  . docusate sodium (COLACE) 100 MG capsule 1 capsule as needed    . lidocaine-prilocaine (EMLA) cream Apply 1 application topically as needed. 30 g 1  . NORVASC 2.5 MG tablet TAKE 1 TABLET BY MOUTH EVERY DAY 90 tablet 1  . ondansetron (ZOFRAN) 8 MG tablet Take 1 tablet (8 mg total) by mouth every 8 (eight) hours as needed for nausea or vomiting. 20 tablet 2  . prochlorperazine (COMPAZINE) 10 MG tablet Take 1 tablet (10 mg total) by mouth every 6 (six) hours  as needed for nausea or vomiting. 30 tablet 2   Current Facility-Administered Medications  Medication Dose Route Frequency Provider Last Rate Last Admin  . 0.9 %  sodium chloride infusion  500 mL Intravenous Once Doran Stabler, MD       Facility-Administered Medications Ordered in Other Visits  Medication Dose Route Frequency Provider Last [place]  . 0.9 %  sodium chloride infusion   Intravenous Once [name], MD      . atropine injection 0.5 mg  0.5 mg Intravenous Once PRN [name], MD      . fluorouracil (ADRUCIL) 3,000 mg in sodium chloride 0.9 % 90 mL chemo infusion  2,170 mg/m2 (Order-Specific) Intravenous 1 day or 1 dose  [name], MD      . irinotecan (CAMPTOSAR) 180 mg in sodium chloride 0.9 % 500 mL chemo infusion  130 mg/m2 (Order-Specific) Intravenous Once [name], MD      . leucovorin 556 mg in sodium chloride 0.9 % 250 mL infusion  400 mg/m2 (Order-Specific) Intravenous Once [name], MD        PHYSICAL EXAMINATION: ECOG PERFORMANCE STATUS: 1 - Symptomatic but completely ambulatory  Vitals:   11/29/20 1133  BP: 119/72  Pulse: 84  Resp: 18  Temp: 97.9 F (36.6 C)  SpO2: 100%   Filed Weights   11/29/20 1133  Weight: 97 lb 6.4 oz (44.2 kg)    Due to COVID19 we will limit examination to appearance. Patient had no complaints.  GENERAL:alert, no distress and comfortable SKIN: skin color normal, no rashes or significant lesions EYES: normal, Conjunctiva are pink and non-injected, sclera clear  NEURO: alert & oriented x 3 with fluent speech   LABORATORY DATA:  I have reviewed the data as listed CBC Latest Ref Rng & Units 11/29/2020 11/15/2020 11/01/2020  WBC 4.0 - 10.5 K/uL 12.9(H) 7.4 8.2  Hemoglobin 12.0 - 15.0 g/dL 9.0(L) 9.2(L) 9.0(L)  Hematocrit 36.0 - 46.0 % 29.3(L) 29.3(L) 29.5(L)  Platelets 150 - 400 K/uL 186 207 215     CMP Latest Ref Rng & Units 11/29/2020 11/15/2020 11/01/2020  Glucose 70 - 99 mg/dL 101(H) 82 84  BUN 8 - 23 mg/dL _0 Creatinine 0.44 - 1.00 mg/dL 0.72 0.73 0.77  Sodium 135 - 145 mmol/L 139 138 141  Potassium 3.5 - 5.1 mmol/L 3.6 3.4(L) 3.7  Chloride 98 - 111 mmol/L 105 102 105  CO2 22 - 32 mmol/L _1 Calcium 8.9 - 10.3 mg/dL 8.7(L) 9.1 8.7(L)  Total Protein 6.5 - 8.1 g/dL 6.4(L) 6.8 6.6  Total Bilirubin 0.3 - 1.2 mg/dL 0.3 0.3 0.2(L)  Alkaline Phos 38 - 126 U/L 84 89 77  AST 15 - 41 U/L 12(L) 13(L) 13(L)  ALT 0 - 44 U/L <_2 RADIOGRAPHIC STUDIES: I have personally reviewed the radiological images as listed and agreed with the findings in the report. No results found.   ASSESSMENT & PLAN:  Brittney Spencer is a 70 y.o. female with    1. Rectal adenocarcinoma, withliverand right pelvic metastasis, recurrence from previous colon cancer vs new primary -Diagnosed on routine screening colonoscopy12/2021, rectal mass biopsy showed invasive adenocarcinoma, arising from a tubular adenoma -Her 1/12/22PETshowedpositive uptake of known rectal tumor and soft tissue mass within the right iliac fossa indicating recurrent prior colon cancer. There is alsoleft lobe of liver lesion is FDG avid concerning for liver metastasis.pt declined liver biopsy  -Whether this  is metastatic rectal or recurrent/metastatic colon cancer, this is likely not curable, but still treatable. She previously declined referral for HIPEC -She began first-line systemic chemo with dose-reduced FOLFIRI on2/6/22. Bevacizumabadded, and irinotecan/5FU increased with cycle 2, but due to poor tolerance/diarrhea irinotecan was dose reduced with cycle 3 and she has been tolerating well.  -Her CEA and 10/27/20 CT CAP has shown good response.  -With C8 she had pump malfunction. She had to stop pump early, but was able to complete the next day. She tolerated well.  -she is clinically doing well.Labs reviewed and adequate to proceed with C9 FOLFIRI and Beva at same dose.  -F/u in 2 weeks.   2. Cancer of ascending colon, pT4bN1bM0, stage IIIC, MSI-stable, (+) surgical margins at pelvic wall -She was diagnosed in 12/2015. She is s/p right hemicolectomy with rightsalpingo oophorectomyand adjuvant ChemoRT.  -she declined adjuvant chemodue to the concern of side effects and impact on her quality of life. -Her 04/2019 CT scan was NED -With recent rectal cancer diagnosis, herPET from 07/12/20 indicates soft tissue mass within the right iliac fossa is noted and consistent with local tumor recurrence from previous ascendingcolon tumor.  -Shepreviouslydeclined option of liver biopsy.   3. Mild Anemia  -Has been stable and mild for the past year. -worse lately due  to her newly diagnosed rectal cancerand starting chemo. Denies overt GI bleeding.  -Moderate and stable lately.  4. HTN, Anxiety -She'll follow-up with her primary care physician and continue medication -She uses Xanax as needed.She is stable. -She continues to cope with her diagnosis and treatment.   5. Hypokalemia  -She notes she is not taking K supplement. She instead increased potassium in diet.   Plan: -Labs reviewed and adequate to proceed with C9 FOLFIRI and Beva today at same dose.  -Lab, flush, F/u and FOLFIRI and Beva in 2, 4, 6 weeks    No problem-specific Assessment & Plan notes found for this encounter.   No orders of the defined types were placed in this encounter.  All questions were answered. The patient knows to call the clinic with any problems, questions or concerns. No barriers to learning was detected. The total time spent in the appointment was 30 minutes.     [name], MD 11/29/2020   I, Joslyn Devon, am acting as scribe for [name], MD.   I have reviewed the above documentation for accuracy and completeness, and I agree with the above.

## 2020-11-29 ENCOUNTER — Inpatient Hospital Stay (HOSPITAL_BASED_OUTPATIENT_CLINIC_OR_DEPARTMENT_OTHER): Payer: Federal, State, Local not specified - PPO | Admitting: Hematology

## 2020-11-29 ENCOUNTER — Other Ambulatory Visit: Payer: Federal, State, Local not specified - PPO

## 2020-11-29 ENCOUNTER — Encounter: Payer: Self-pay | Admitting: Hematology

## 2020-11-29 ENCOUNTER — Inpatient Hospital Stay: Payer: Federal, State, Local not specified - PPO | Attending: Nurse Practitioner

## 2020-11-29 ENCOUNTER — Other Ambulatory Visit: Payer: Self-pay

## 2020-11-29 ENCOUNTER — Inpatient Hospital Stay: Payer: Federal, State, Local not specified - PPO

## 2020-11-29 ENCOUNTER — Ambulatory Visit: Payer: Federal, State, Local not specified - PPO | Admitting: Hematology

## 2020-11-29 VITALS — BP 119/72 | HR 84 | Temp 97.9°F | Resp 18 | Wt 97.4 lb

## 2020-11-29 DIAGNOSIS — Z9221 Personal history of antineoplastic chemotherapy: Secondary | ICD-10-CM | POA: Diagnosis not present

## 2020-11-29 DIAGNOSIS — C182 Malignant neoplasm of ascending colon: Secondary | ICD-10-CM

## 2020-11-29 DIAGNOSIS — Z923 Personal history of irradiation: Secondary | ICD-10-CM | POA: Diagnosis not present

## 2020-11-29 DIAGNOSIS — C20 Malignant neoplasm of rectum: Secondary | ICD-10-CM

## 2020-11-29 DIAGNOSIS — E876 Hypokalemia: Secondary | ICD-10-CM | POA: Diagnosis not present

## 2020-11-29 DIAGNOSIS — I10 Essential (primary) hypertension: Secondary | ICD-10-CM

## 2020-11-29 DIAGNOSIS — F419 Anxiety disorder, unspecified: Secondary | ICD-10-CM | POA: Insufficient documentation

## 2020-11-29 DIAGNOSIS — C787 Secondary malignant neoplasm of liver and intrahepatic bile duct: Secondary | ICD-10-CM | POA: Diagnosis not present

## 2020-11-29 DIAGNOSIS — D649 Anemia, unspecified: Secondary | ICD-10-CM | POA: Diagnosis not present

## 2020-11-29 DIAGNOSIS — Z5112 Encounter for antineoplastic immunotherapy: Secondary | ICD-10-CM | POA: Diagnosis not present

## 2020-11-29 DIAGNOSIS — Z5111 Encounter for antineoplastic chemotherapy: Secondary | ICD-10-CM | POA: Diagnosis present

## 2020-11-29 DIAGNOSIS — Z5189 Encounter for other specified aftercare: Secondary | ICD-10-CM | POA: Insufficient documentation

## 2020-11-29 DIAGNOSIS — Z95828 Presence of other vascular implants and grafts: Secondary | ICD-10-CM

## 2020-11-29 LAB — CBC WITH DIFFERENTIAL (CANCER CENTER ONLY)
Abs Immature Granulocytes: 0.1 10*3/uL — ABNORMAL HIGH (ref 0.00–0.07)
Basophils Absolute: 0 10*3/uL (ref 0.0–0.1)
Basophils Relative: 0 %
Eosinophils Absolute: 0.1 10*3/uL (ref 0.0–0.5)
Eosinophils Relative: 1 %
HCT: 29.3 % — ABNORMAL LOW (ref 36.0–46.0)
Hemoglobin: 9 g/dL — ABNORMAL LOW (ref 12.0–15.0)
Immature Granulocytes: 1 %
Lymphocytes Relative: 10 %
Lymphs Abs: 1.3 10*3/uL (ref 0.7–4.0)
MCH: 26.5 pg (ref 26.0–34.0)
MCHC: 30.7 g/dL (ref 30.0–36.0)
MCV: 86.4 fL (ref 80.0–100.0)
Monocytes Absolute: 0.8 10*3/uL (ref 0.1–1.0)
Monocytes Relative: 6 %
Neutro Abs: 10.5 10*3/uL — ABNORMAL HIGH (ref 1.7–7.7)
Neutrophils Relative %: 82 %
Platelet Count: 186 10*3/uL (ref 150–400)
RBC: 3.39 MIL/uL — ABNORMAL LOW (ref 3.87–5.11)
RDW: 15.9 % — ABNORMAL HIGH (ref 11.5–15.5)
WBC Count: 12.9 10*3/uL — ABNORMAL HIGH (ref 4.0–10.5)
nRBC: 0 % (ref 0.0–0.2)

## 2020-11-29 LAB — CMP (CANCER CENTER ONLY)
ALT: <6 U/L (ref 0–44)
AST: 12 U/L — ABNORMAL LOW (ref 15–41)
Albumin: 3.2 g/dL — ABNORMAL LOW (ref 3.5–5.0)
Alkaline Phosphatase: 84 U/L (ref 38–126)
Anion gap: 10 (ref 5–15)
BUN: 11 mg/dL (ref 8–23)
CO2: 24 mmol/L (ref 22–32)
Calcium: 8.7 mg/dL — ABNORMAL LOW (ref 8.9–10.3)
Chloride: 105 mmol/L (ref 98–111)
Creatinine: 0.72 mg/dL (ref 0.44–1.00)
GFR, Estimated: >60 mL/min (ref >60)
Glucose, Bld: 101 mg/dL — ABNORMAL HIGH (ref 70–99)
Potassium: 3.6 mmol/L (ref 3.5–5.1)
Sodium: 139 mmol/L (ref 135–145)
Total Bilirubin: 0.3 mg/dL (ref 0.3–1.2)
Total Protein: 6.4 g/dL — ABNORMAL LOW (ref 6.5–8.1)

## 2020-11-29 LAB — TOTAL PROTEIN, URINE DIPSTICK: Protein, ur: NEGATIVE mg/dL

## 2020-11-29 MED ORDER — SODIUM CHLORIDE 0.9 % IV SOLN
Freq: Once | INTRAVENOUS | Status: AC
  Filled 2020-11-29: qty 250

## 2020-11-29 MED ORDER — PALONOSETRON HCL INJECTION 0.25 MG/5ML
INTRAVENOUS | Status: AC
  Filled 2020-11-29: qty 5

## 2020-11-29 MED ORDER — SODIUM CHLORIDE 0.9 % IV SOLN
130.0000 mg/m2 | Freq: Once | INTRAVENOUS | Status: AC
  Administered 2020-11-29: 180 mg via INTRAVENOUS
  Filled 2020-11-29: qty 9

## 2020-11-29 MED ORDER — ATROPINE SULFATE 1 MG/ML IJ SOLN
INTRAMUSCULAR | Status: AC
  Filled 2020-11-29: qty 1

## 2020-11-29 MED ORDER — SODIUM CHLORIDE 0.9% FLUSH
10.0000 mL | Freq: Once | INTRAVENOUS | Status: AC
  Administered 2020-11-29: 10 mL
  Filled 2020-11-29: qty 10

## 2020-11-29 MED ORDER — SODIUM CHLORIDE 0.9 % IV SOLN
10.0000 mg | Freq: Once | INTRAVENOUS | Status: AC
  Administered 2020-11-29: 10 mg via INTRAVENOUS
  Filled 2020-11-29: qty 10

## 2020-11-29 MED ORDER — SODIUM CHLORIDE 0.9 % IV SOLN
250.0000 mg | Freq: Once | INTRAVENOUS | Status: AC
  Administered 2020-11-29: 250 mg via INTRAVENOUS
  Filled 2020-11-29: qty 10

## 2020-11-29 MED ORDER — PALONOSETRON HCL INJECTION 0.25 MG/5ML
0.2500 mg | Freq: Once | INTRAVENOUS | Status: AC
  Administered 2020-11-29: 0.25 mg via INTRAVENOUS

## 2020-11-29 MED ORDER — SODIUM CHLORIDE 0.9 % IV SOLN
2170.0000 mg/m2 | INTRAVENOUS | Status: DC
  Administered 2020-11-29: 3000 mg via INTRAVENOUS
  Filled 2020-11-29: qty 60

## 2020-11-29 MED ORDER — ATROPINE SULFATE 1 MG/ML IJ SOLN
0.5000 mg | Freq: Once | INTRAMUSCULAR | Status: AC | PRN
  Administered 2020-11-29: 0.5 mg via INTRAVENOUS

## 2020-11-29 MED ORDER — SODIUM CHLORIDE 0.9 % IV SOLN
400.0000 mg/m2 | Freq: Once | INTRAVENOUS | Status: AC
  Administered 2020-11-29: 556 mg via INTRAVENOUS
  Filled 2020-11-29: qty 27.8

## 2020-11-29 MED ORDER — SODIUM CHLORIDE 0.9 % IV SOLN
Freq: Once | INTRAVENOUS | Status: DC
  Filled 2020-11-29: qty 250

## 2020-11-29 NOTE — Patient Instructions (Signed)
[facility]  Discharge Instructions: Thank you for choosing [facility] to provide your oncology and hematology care.   If you have a lab appointment with the [facility], please go directly to the [facility] and check in at the registration area.   Wear comfortable clothing and clothing appropriate for easy access to any Portacath or PICC line.   We strive to give you quality time with your provider. You may need to reschedule your appointment if you arrive late (15 or more minutes).  Arriving late affects you and other patients whose appointments are after yours.  Also, if you miss three or more appointments without notifying the office, you may be dismissed from the clinic at the provider's discretion.      For prescription refill requests, have your pharmacy contact our office and allow 72 hours for refills to be completed.    Today you received the following chemotherapy and/or immunotherapy agents bevacizumab, irinotecan, leucovorin, fluorourcil   To help prevent nausea and vomiting after your treatment, we encourage you to take your nausea medication as directed.  BELOW ARE SYMPTOMS THAT SHOULD BE REPORTED IMMEDIATELY: . *FEVER GREATER THAN 100.4 F (38 C) OR HIGHER . *CHILLS OR SWEATING . *NAUSEA AND VOMITING THAT IS NOT CONTROLLED WITH YOUR NAUSEA MEDICATION . *UNUSUAL SHORTNESS OF BREATH . *UNUSUAL BRUISING OR BLEEDING . *URINARY PROBLEMS (pain or burning when urinating, or frequent urination) . *BOWEL PROBLEMS (unusual diarrhea, constipation, pain near the anus) . TENDERNESS IN MOUTH AND THROAT WITH OR WITHOUT PRESENCE OF ULCERS (sore throat, sores in mouth, or a toothache) . UNUSUAL RASH, SWELLING OR PAIN  . UNUSUAL VAGINAL DISCHARGE OR ITCHING   Items with * indicate a potential emergency and should be followed up as soon as possible or go to the Emergency Department if any problems should occur.  Please show the CHEMOTHERAPY  ALERT CARD or IMMUNOTHERAPY ALERT CARD at check-in to the Emergency Department and triage nurse.  Should you have questions after your visit or need to cancel or reschedule your appointment, please contact [facility]  Dept: [phone]  and follow the prompts.  Office hours are 8:00 a.m. to 4:30 p.m. Monday - Friday. Please note that voicemails left after 4:00 p.m. may not be returned until the following business day.  We are closed weekends and major holidays. You have access to a nurse at all times for urgent questions. Please call the main number to the clinic Dept: [phone] and follow the prompts.   For any non-urgent questions, you may also contact your provider using MyChart. We now offer e-Visits for anyone 43 and older to request care online for non-urgent symptoms. For details visit [email or web address].   Also download the MyChart app! Go to the app store, search "MyChart", open the app, select [facility], and log in with your MyChart username and password.  Due to Covid, a mask is required upon entering the hospital/clinic. If you do not have a mask, one will be given to you upon arrival. For doctor visits, patients may have 1 support person aged 55 or older with them. For treatment visits, patients cannot have anyone with them due to current Covid guidelines and our immunocompromised population.

## 2020-11-30 ENCOUNTER — Telehealth: Payer: Self-pay | Admitting: Hematology

## 2020-11-30 NOTE — Telephone Encounter (Signed)
Left message with follow-up appointment per 6/1 los. Gave option to call back to reschedule if needed.

## 2020-12-01 ENCOUNTER — Other Ambulatory Visit: Payer: Self-pay

## 2020-12-01 ENCOUNTER — Inpatient Hospital Stay: Payer: Federal, State, Local not specified - PPO

## 2020-12-01 VITALS — BP 125/73 | HR 76 | Temp 98.0°F | Resp 18

## 2020-12-01 DIAGNOSIS — C787 Secondary malignant neoplasm of liver and intrahepatic bile duct: Secondary | ICD-10-CM

## 2020-12-01 DIAGNOSIS — C20 Malignant neoplasm of rectum: Secondary | ICD-10-CM

## 2020-12-01 DIAGNOSIS — Z5112 Encounter for antineoplastic immunotherapy: Secondary | ICD-10-CM | POA: Diagnosis not present

## 2020-12-01 MED ORDER — SODIUM CHLORIDE 0.9% FLUSH
10.0000 mL | INTRAVENOUS | Status: DC | PRN
  Administered 2020-12-01: 10 mL
  Filled 2020-12-01: qty 10

## 2020-12-01 MED ORDER — HEPARIN SOD (PORK) LOCK FLUSH 100 UNIT/ML IV SOLN
500.0000 [IU] | Freq: Once | INTRAVENOUS | Status: AC | PRN
  Administered 2020-12-01: 500 [IU]
  Filled 2020-12-01: qty 5

## 2020-12-01 MED ORDER — PEGFILGRASTIM-JMDB 6 MG/0.6ML ~~LOC~~ SOSY
PREFILLED_SYRINGE | SUBCUTANEOUS | Status: AC
  Filled 2020-12-01: qty 0.6

## 2020-12-01 MED ORDER — PEGFILGRASTIM-JMDB 6 MG/0.6ML ~~LOC~~ SOSY
6.0000 mg | PREFILLED_SYRINGE | Freq: Once | SUBCUTANEOUS | Status: AC
Start: 2020-12-01 — End: 2020-12-01
  Administered 2020-12-01: 6 mg via SUBCUTANEOUS

## 2020-12-13 ENCOUNTER — Inpatient Hospital Stay: Payer: Federal, State, Local not specified - PPO

## 2020-12-13 ENCOUNTER — Other Ambulatory Visit: Payer: Self-pay

## 2020-12-13 ENCOUNTER — Inpatient Hospital Stay (HOSPITAL_BASED_OUTPATIENT_CLINIC_OR_DEPARTMENT_OTHER): Payer: Federal, State, Local not specified - PPO | Admitting: Nurse Practitioner

## 2020-12-13 ENCOUNTER — Encounter: Payer: Self-pay | Admitting: Hematology

## 2020-12-13 ENCOUNTER — Encounter: Payer: Self-pay | Admitting: Nurse Practitioner

## 2020-12-13 VITALS — BP 123/67 | HR 78 | Temp 97.6°F | Resp 16 | Ht 66.0 in | Wt 96.9 lb

## 2020-12-13 DIAGNOSIS — C182 Malignant neoplasm of ascending colon: Secondary | ICD-10-CM

## 2020-12-13 DIAGNOSIS — C20 Malignant neoplasm of rectum: Secondary | ICD-10-CM

## 2020-12-13 DIAGNOSIS — C787 Secondary malignant neoplasm of liver and intrahepatic bile duct: Secondary | ICD-10-CM

## 2020-12-13 DIAGNOSIS — Z95828 Presence of other vascular implants and grafts: Secondary | ICD-10-CM

## 2020-12-13 DIAGNOSIS — Z5112 Encounter for antineoplastic immunotherapy: Secondary | ICD-10-CM | POA: Diagnosis not present

## 2020-12-13 LAB — CBC WITH DIFFERENTIAL (CANCER CENTER ONLY)
Abs Immature Granulocytes: 0.17 10*3/uL — ABNORMAL HIGH (ref 0.00–0.07)
Basophils Absolute: 0 10*3/uL (ref 0.0–0.1)
Basophils Relative: 0 %
Eosinophils Absolute: 0.1 10*3/uL (ref 0.0–0.5)
Eosinophils Relative: 1 %
HCT: 29.9 % — ABNORMAL LOW (ref 36.0–46.0)
Hemoglobin: 9.2 g/dL — ABNORMAL LOW (ref 12.0–15.0)
Immature Granulocytes: 2 %
Lymphocytes Relative: 13 %
Lymphs Abs: 1.2 10*3/uL (ref 0.7–4.0)
MCH: 26.7 pg (ref 26.0–34.0)
MCHC: 30.8 g/dL (ref 30.0–36.0)
MCV: 86.9 fL (ref 80.0–100.0)
Monocytes Absolute: 0.6 10*3/uL (ref 0.1–1.0)
Monocytes Relative: 7 %
Neutro Abs: 7.2 10*3/uL (ref 1.7–7.7)
Neutrophils Relative %: 77 %
Platelet Count: 189 10*3/uL (ref 150–400)
RBC: 3.44 MIL/uL — ABNORMAL LOW (ref 3.87–5.11)
RDW: 16.5 % — ABNORMAL HIGH (ref 11.5–15.5)
WBC Count: 9.3 10*3/uL (ref 4.0–10.5)
nRBC: 0 % (ref 0.0–0.2)

## 2020-12-13 LAB — CMP (CANCER CENTER ONLY)
ALT: 7 U/L (ref 0–44)
AST: 12 U/L — ABNORMAL LOW (ref 15–41)
Albumin: 3.4 g/dL — ABNORMAL LOW (ref 3.5–5.0)
Alkaline Phosphatase: 90 U/L (ref 38–126)
Anion gap: 8 (ref 5–15)
BUN: 11 mg/dL (ref 8–23)
CO2: 23 mmol/L (ref 22–32)
Calcium: 8.6 mg/dL — ABNORMAL LOW (ref 8.9–10.3)
Chloride: 108 mmol/L (ref 98–111)
Creatinine: 0.72 mg/dL (ref 0.44–1.00)
GFR, Estimated: >60 mL/min (ref >60)
Glucose, Bld: 103 mg/dL — ABNORMAL HIGH (ref 70–99)
Potassium: 3.9 mmol/L (ref 3.5–5.1)
Sodium: 139 mmol/L (ref 135–145)
Total Bilirubin: 0.2 mg/dL — ABNORMAL LOW (ref 0.3–1.2)
Total Protein: 6.6 g/dL (ref 6.5–8.1)

## 2020-12-13 LAB — CEA (IN HOUSE-CHCC): CEA (CHCC-In House): 4.72 ng/mL (ref 0.00–5.00)

## 2020-12-13 LAB — TOTAL PROTEIN, URINE DIPSTICK: Protein, ur: NEGATIVE mg/dL

## 2020-12-13 MED ORDER — SODIUM CHLORIDE 0.9% FLUSH
10.0000 mL | Freq: Once | INTRAVENOUS | Status: AC
  Administered 2020-12-13: 10 mL
  Filled 2020-12-13: qty 10

## 2020-12-13 MED ORDER — PALONOSETRON HCL INJECTION 0.25 MG/5ML
0.2500 mg | Freq: Once | INTRAVENOUS | Status: AC
  Administered 2020-12-13: 0.25 mg via INTRAVENOUS

## 2020-12-13 MED ORDER — SODIUM CHLORIDE 0.9 % IV SOLN
130.0000 mg/m2 | Freq: Once | INTRAVENOUS | Status: AC
  Administered 2020-12-13: 180 mg via INTRAVENOUS
  Filled 2020-12-13: qty 9

## 2020-12-13 MED ORDER — SODIUM CHLORIDE 0.9 % IV SOLN
250.0000 mg | Freq: Once | INTRAVENOUS | Status: AC
  Administered 2020-12-13: 250 mg via INTRAVENOUS
  Filled 2020-12-13: qty 10

## 2020-12-13 MED ORDER — PALONOSETRON HCL INJECTION 0.25 MG/5ML
INTRAVENOUS | Status: AC
  Filled 2020-12-13: qty 5

## 2020-12-13 MED ORDER — SODIUM CHLORIDE 0.9 % IV SOLN
400.0000 mg/m2 | Freq: Once | INTRAVENOUS | Status: AC
  Administered 2020-12-13: 556 mg via INTRAVENOUS
  Filled 2020-12-13: qty 27.8

## 2020-12-13 MED ORDER — SODIUM CHLORIDE 0.9 % IV SOLN
10.0000 mg | Freq: Once | INTRAVENOUS | Status: AC
  Administered 2020-12-13: 10 mg via INTRAVENOUS
  Filled 2020-12-13: qty 10

## 2020-12-13 MED ORDER — ATROPINE SULFATE 1 MG/ML IJ SOLN
INTRAMUSCULAR | Status: AC
  Filled 2020-12-13: qty 1

## 2020-12-13 MED ORDER — ATROPINE SULFATE 1 MG/ML IJ SOLN
0.5000 mg | Freq: Once | INTRAMUSCULAR | Status: AC | PRN
  Administered 2020-12-13: 0.5 mg via INTRAVENOUS

## 2020-12-13 MED ORDER — SODIUM CHLORIDE 0.9 % IV SOLN
Freq: Once | INTRAVENOUS | Status: AC
Start: 2020-12-13 — End: 2020-12-13
  Filled 2020-12-13: qty 250

## 2020-12-13 MED ORDER — SODIUM CHLORIDE 0.9 % IV SOLN
2170.0000 mg/m2 | INTRAVENOUS | Status: DC
  Administered 2020-12-13: 3000 mg via INTRAVENOUS
  Filled 2020-12-13: qty 60

## 2020-12-13 NOTE — Patient Instructions (Signed)
[facility]  Discharge Instructions: Thank you for choosing [facility] to provide your oncology and hematology care.   If you have a lab appointment with the [facility], please go directly to the [facility] and check in at the registration area.   Wear comfortable clothing and clothing appropriate for easy access to any Portacath or PICC line.   We strive to give you quality time with your provider. You may need to reschedule your appointment if you arrive late (15 or more minutes).  Arriving late affects you and other patients whose appointments are after yours.  Also, if you miss three or more appointments without notifying the office, you may be dismissed from the clinic at the provider's discretion.      For prescription refill requests, have your pharmacy contact our office and allow 72 hours for refills to be completed.    Today you received the following chemotherapy and/or immunotherapy agents bevacizumab, irinotecan, leucovorin, fluorourcil   To help prevent nausea and vomiting after your treatment, we encourage you to take your nausea medication as directed.  BELOW ARE SYMPTOMS THAT SHOULD BE REPORTED IMMEDIATELY: *FEVER GREATER THAN 100.4 F (38 C) OR HIGHER *CHILLS OR SWEATING *NAUSEA AND VOMITING THAT IS NOT CONTROLLED WITH YOUR NAUSEA MEDICATION *UNUSUAL SHORTNESS OF BREATH *UNUSUAL BRUISING OR BLEEDING *URINARY PROBLEMS (pain or burning when urinating, or frequent urination) *BOWEL PROBLEMS (unusual diarrhea, constipation, pain near the anus) TENDERNESS IN MOUTH AND THROAT WITH OR WITHOUT PRESENCE OF ULCERS (sore throat, sores in mouth, or a toothache) UNUSUAL RASH, SWELLING OR PAIN  UNUSUAL VAGINAL DISCHARGE OR ITCHING   Items with * indicate a potential emergency and should be followed up as soon as possible or go to the Emergency Department if any problems should occur.  Please show the CHEMOTHERAPY ALERT CARD or  IMMUNOTHERAPY ALERT CARD at check-in to the Emergency Department and triage nurse.  Should you have questions after your visit or need to cancel or reschedule your appointment, please contact [facility]  Dept: [phone]  and follow the prompts.  Office hours are 8:00 a.m. to 4:30 p.m. Monday - Friday. Please note that voicemails left after 4:00 p.m. may not be returned until the following business day.  We are closed weekends and major holidays. You have access to a nurse at all times for urgent questions. Please call the main number to the clinic Dept: [phone] and follow the prompts.   For any non-urgent questions, you may also contact your provider using MyChart. We now offer e-Visits for anyone 72 and older to request care online for non-urgent symptoms. For details visit [email or web address].   Also download the MyChart app! Go to the app store, search "MyChart", open the app, select [facility], and log in with your MyChart username and password.  Due to Covid, a mask is required upon entering the hospital/clinic. If you do not have a mask, one will be given to you upon arrival. For doctor visits, patients may have 1 support person aged 17 or older with them. For treatment visits, patients cannot have anyone with them due to current Covid guidelines and our immunocompromised population.

## 2020-12-13 NOTE — Progress Notes (Signed)
Ok to continue bevacizumab 250mg  today per Regan Rakers, NP

## 2020-12-13 NOTE — Progress Notes (Signed)
Brittney [facility]   [phone] [phone]) [phone]   Clinic Follow up Note   Patient Care Team: Patient, No Pcp Per (Inactive) as PCP - General (General Practice) [name], NP as Nurse Practitioner (Oncology) [name], MD as Consulting Physician (Oncology) [name], RN as Oncology Nurse Navigator 12/13/2020  CHIEF COMPLAINT: Follow up h/o colon cancer and metastatic rectal cancer   SUMMARY OF ONCOLOGIC HISTORY: Oncology History Overview Note  Cancer Staging Cancer of ascending colon [facility]) Staging form: Colon and Rectum, AJCC 7th Edition - Clinical stage from 02/07/2016: Stage IIIC (T4b, N1b, M0) - Signed by [name], MD on 03/04/2016    Cancer of ascending colon ([place])  10/25/2015 Imaging   CT ABD/PELVIS:  Inflammatory changes inferior to the cecal tip appear improved, there is still irregular soft tissue thickening of the cecal tip, and there are adjacent prominent lymph nodes in the ileocolonic mesentery, measuring 13 mm on image 49 and 8 mm on image 52. In addition, there is a 2.5 x 1.8 cm nodule on image 46 which has central low density. Therefore, these findings are moderately suspicious for an underlying cecal malignancy with perforation.     01/19/2016 Procedure   COLONOSCOPY per Dr. [name]: Fungating, ulcerated mass almost obstructing mid ascending colon    01/19/2016 Initial Biopsy   Diagnosis Surgical [P], cecal mass - INVASIVE ADENOCARCINOMA WITH ULCERATION. - SEE COMMENT.    02/05/2016 Tumor Marker   Patient's tumor was tested for the following markers: CEA Results of the tumor marker test revealed 5.7.    02/07/2016 Initial Diagnosis   Cancer of ascending colon ([facility])    02/07/2016 Definitive Surgery   Laparoscopic assisted right hemicolectomy and right salpingo oopherectomy--Dr. [name]    02/07/2016 Pathologic Stage   p T4 N1b   2/43 nodes +    02/07/2016 Pathology Results   MMR normal; G2 adenocarcinoma;proximal &  distal margins negative; soft tissue mass on pelvic sidewall + for adenocarcinoma with positive margin MSI Stable    03/08/2016 Imaging   CT chest negative for metastasis.     03/19/2016 - 04/25/2016 Radiation Therapy   Adjuvant irradiation, 50 gray in 28 fractions    03/19/2016 - 04/22/2016 Chemotherapy   Xeloda 1500 mg twice daily, started on 03/19/2016, dose reduced to 1000 mg twice daily from week 3 due to neutropenia, and patient stopped 3 days before last dose radiation due to difficulty swallowing the pill    05/20/2016 -  Adjuvant Chemotherapy   Patient declined adjuvant chemotherapy    09/16/2016 Imaging   CT CAP w Contrast 1. No evidence of local tumor recurrence at the ileocolic anastomosis. 2. No findings suspicious for metastatic disease in the chest, abdomen or pelvis. 3. Nonspecific trace free fluid in the pelvic cul-de-sac. 4. Stable solitary 3 mm right upper lobe pulmonary nodule, for which 6 month stability has been demonstrated, probably benign. 5. Additional findings include stable right posterior pericardial cyst and small calcified uterine fibroids.    05/13/2017 Imaging   CT CAP W Contrast 05/13/17 IMPRESSION: 1. No current findings of residual or recurrent malignancy. 2. Mild prominence of stool throughout the colon. Nondistended portions of the rectum. 3. Several tiny pulmonary nodules are stable from the earliest available comparison of 03/08/2016 and probably benign, but may merit surveillance. 4. Other imaging findings of potential clinical significance: Old granulomatous disease. Aortoiliac atherosclerotic vascular disease. Lumbar spondylosis and degenerative disc disease. Stable amount of trace free pelvic fluid.   04/27/2018 Imaging  04/27/2018 CT CAP IMPRESSION: Stable exam. No evidence of recurrent or metastatic carcinoma within the chest, abdomen, or pelvis   04/19/2019 Imaging   CT CAP W Contrast  IMPRESSION: Chest Impression:    1. No evidence of thoracic metastasis. 2. Stable small bilateral pulmonary nodules.   Abdomen / Pelvis Impression:   1. No evidence local colorectal carcinoma recurrence or metastasis in the abdomen pelvis. 2. Post RIGHT hemicolectomy.   Rectal cancer metastasized to liver ([facility])  06/15/2020 Procedure   Screening Colonoscopy by Dr [name]  IMPRESSION - Decreased sphincter tone and internal hemorrhoids that prolapse with straining, but require manual replacement into the anal canal (Grade III) found on digital rectal exam. - Patent side-to-side ileo-colonic anastomosis, characterized by healthy appearing mucosa. - The examined portion of the ileum was normal. - One diminutive polyp in the proximal transverse colon, removed with a cold biopsy forceps. Resected and retrieved. - Likely malignant partially obstructing tumor in the mid rectum. Biopsied. Tattooed. - The examination was otherwise normal on direct and retroflexion views.   06/15/2020 Initial Biopsy   Diagnosis 1. Transverse Colon Polyp - HYPERPLASTIC POLYP 2. Rectum, biopsy - ADENOCARCINOMA ARISING IN A TUBULAR ADENOMA WITH HIGH-GRADE DYSPLASIA. SEE NOTE Diagnosis Note 2. Dr. [name] reviewed the case and concurs with the diagnosis. Dr. [name] was notified on 06/16/2020.   06/28/2020 Imaging   CT CAP  IMPRESSION: 1. New low-density focus in the anterior aspect of the lateral segment LEFT hepatic lobe measuring 1.2 x 1.0 cm, compatible with small metastatic lesion in the LEFT hepatic lobe. 2. Soft tissue in the RIGHT iliac fossa following RIGHT hemicolectomy invades the psoas musculature and is slowly enlarging over time, more linear on the prior study now highly concerning for recurrence/metastasis to this location. 3. Signs of enteritis, potentially post radiation changes of the small bowel. Tethered small bowel in the RIGHT lower quadrant shows focal thickening and narrowing suspicious for small bowel involvement  and developing partial obstruction though currently contrast passes beyond this point into the colon. 4. Rectal thickening in this patient with known rectal mass as described. 5. No evidence of metastatic disease in the chest. 6. Stable small pulmonary nodules. 7.  and aortic atherosclerosis.   Aortic Atherosclerosis (ICD10-I70.0) and Emphysema (ICD10-J43.9).   07/04/2020 Initial Diagnosis   Rectal cancer metastasized to liver ([facility] [place])   07/12/2020 PET scan   IMPRESSION: 1. Exam positive for FDG avid rectal tumor which corresponds to the recent colonoscopy findings. 2. FDG avid soft tissue mass within the right iliac fossa is noted and consistent with local tumor recurrence from previous ascending colon tumor. 3. Lateral segment left lobe of liver lesion is FDG avid concerning for liver metastasis. 4. No specific findings identified to suggest metastatic disease to the chest.   08/10/2020 -  Chemotherapy   First-line FOLFIRI q2weeks starting 08/10/20. dose reduced with cycle 1. Irinotecan/5FU increased and Bevacizumab added with cycle 2 on 08/23/2020    10/27/2020 Imaging   CT CAP  IMPRESSION: 1. Interval decrease in size of the hypermetabolic left hepatic lesion, consistent with metastatic disease. No new liver lesion evident. 2. Interval resolution of the hypermetabolic soft tissue lesion along the right iliac fossa with no measurable soft tissue lesion remaining at this location today. 3. Similar appearance of soft tissue fullness in the rectum at the site of the hypermetabolic lesion seen previously. 4. Stable tiny bilateral pulmonary nodules. Continued attention on follow-up recommended. 5. Small volume free fluid in the pelvis. 6. Aortic Atherosclerosis (ICD10-I70.0).  CURRENT THERAPY: First-line FOLFIRI q2weeks starting 08/10/20. dose reduced with cycle 1. Bevacizumab added with cycle 2 on 08/23/2020  INTERVAL HISTORY: Brittney Spencer returns for follow up and treatment as  scheduled. She was last seen 11/29/20 and completed another cycle of FOLFIRI and beva.  She tolerates treatment well.  She remains active, eating and drinking well.  She gargles after meals, denies mucositis.  She has occasional nausea mostly from smells.  Stools are loose or soft, with more diarrhea after treatments up to 2-3 times per day, manages with diet.  She has not taken Lomotil or Imodium.  Denies abdominal or rectal pain, bleeding, fever, chills, cough, chest pain, dyspnea, or other new concerns.  She has a dental visit in July for routine checkup.   MEDICAL HISTORY:  Past Medical History:  Diagnosis Date   AAA (abdominal aortic aneurysm) (HCC)    infrarenal 4.1 cmper s-9-19 scan on chart   Anemia    hx of   Anxiety    has PRN meds   Colon cancer ([place]) 2017   RIGHT hemi colectomy-s/p sx   GERD (gastroesophageal reflux disease)    OTC meds/diet control   Hypertension    on meds   Macular pucker, right eye 10/06/2019   Retinal detachment, right 09/2019   Vitamin D deficiency     SURGICAL HISTORY: Past Surgical History:  Procedure Laterality Date   COLONOSCOPY  2018   [facility]   COLONSCOPY  12/2015   IR IMAGING GUIDED PORT INSERTION  08/04/2020   LAPAROSCOPIC RIGHT HEMI COLECTOMY Right 02/07/2016   Procedure: LAPAROSCOPIC ASSISTED RIGHT HEMI COLECTOMY AND RIGHT SALPINGO OOPHERECTOMY;  Surgeon: [name], MD;  Location: [facility];  Service: General;  Laterality: Right;   RETINAL DETACHMENT SURGERY  09/2019    I have reviewed the social history and family history with the patient and they are unchanged from previous note.  ALLERGIES:  is allergic to fish allergy, peanut-containing drug products, soy allergy, and buspirone.  MEDICATIONS:  Current Outpatient Medications  Medication Sig Dispense Refill   ALPRAZolam (XANAX) 0.25 MG tablet Take 1 tablet (0.25 mg total) by mouth daily as needed for anxiety. 30 tablet 0   Cholecalciferol (VITAMIN D3 PO) Take by mouth daily.      diphenoxylate-atropine (LOMOTIL) 2.5-0.025 MG tablet Take 2 tablets by mouth 4 (four) times daily as needed for diarrhea or loose stools. 45 tablet 3   docusate sodium (COLACE) 100 MG capsule 1 capsule as needed     lidocaine-prilocaine (EMLA) cream Apply 1 application topically as needed. 30 g 1   NORVASC 2.5 MG tablet TAKE 1 TABLET BY MOUTH EVERY DAY 90 tablet 1   ondansetron (ZOFRAN) 8 MG tablet Take 1 tablet (8 mg total) by mouth every 8 (eight) hours as needed for nausea or vomiting. 20 tablet 2   prochlorperazine (COMPAZINE) 10 MG tablet Take 1 tablet (10 mg total) by mouth every 6 (six) hours as needed for nausea or vomiting. 30 tablet 2   Current Facility-Administered Medications  Medication Dose Route Frequency Provider Last Rate Last Admin   0.9 %  sodium chloride infusion  500 mL Intravenous Once Doran Stabler, MD       Facility-Administered Medications Ordered in Other Visits  Medication Dose Route Frequency Provider Last Rate Last Admin   atropine injection 0.5 mg  0.5 mg Intravenous Once PRN [name], MD       bevacizumab-bvzr (ZIRABEV) 250 mg in sodium chloride 0.9 % 100 mL chemo  infusion  250 mg Intravenous Once [name], MD       dexamethasone (DECADRON) 10 mg in sodium chloride 0.9 % 50 mL IVPB  10 mg Intravenous Once [name], MD 204 mL/hr at 12/13/20 1002 10 mg at 12/13/20 1002   fluorouracil (ADRUCIL) 3,000 mg in sodium chloride 0.9 % 90 mL chemo infusion  2,170 mg/m2 (Order-Specific) Intravenous 1 day or 1 dose [name], MD       irinotecan (CAMPTOSAR) 180 mg in sodium chloride 0.9 % 500 mL chemo infusion  130 mg/m2 (Order-Specific) Intravenous Once [name], MD       leucovorin 556 mg in sodium chloride 0.9 % 250 mL infusion  400 mg/m2 (Order-Specific) Intravenous Once [name], MD        PHYSICAL EXAMINATION: ECOG PERFORMANCE STATUS: 1 - Symptomatic but completely ambulatory  Vitals:   12/13/20 0903  BP: 123/67  Pulse: 78  Resp: 16  Temp: 97.6 F (36.4  C)  SpO2: 100%   Filed Weights   12/13/20 0903  Weight: 96 lb 14.4 oz (44 kg)    GENERAL:alert, no distress and comfortable SKIN: no rash  EYES: sclera clear LUNGS:  normal breathing effort HEART: no lower extremity edema NEURO: alert & oriented x 3 with fluent speech, no focal motor deficits PAC without erythema  LABORATORY DATA:  I have reviewed the data as listed CBC Latest Ref Rng & Units 12/13/2020 11/29/2020 11/15/2020  WBC 4.0 - 10.5 K/uL 9.3 12.9(H) 7.4  Hemoglobin 12.0 - 15.0 g/dL 9.2(L) 9.0(L) 9.2(L)  Hematocrit 36.0 - 46.0 % 29.9(L) 29.3(L) 29.3(L)  Platelets 150 - 400 K/uL 189 186 207     CMP Latest Ref Rng & Units 12/13/2020 11/29/2020 11/15/2020  Glucose 70 - 99 mg/dL 103(H) 101(H) 82  BUN 8 - 23 mg/dL _0 Creatinine 0.44 - 1.00 mg/dL 0.72 0.72 0.73  Sodium 135 - 145 mmol/L 139 139 138  Potassium 3.5 - 5.1 mmol/L 3.9 3.6 3.4(L)  Chloride 98 - 111 mmol/L 108 105 102  CO2 22 - 32 mmol/L _1 Calcium 8.9 - 10.3 mg/dL 8.6(L) 8.7(L) 9.1  Total Protein 6.5 - 8.1 g/dL 6.6 6.4(L) 6.8  Total Bilirubin 0.3 - 1.2 mg/dL 0.2(L) 0.3 0.3  Alkaline Phos 38 - 126 U/L 90 84 89  AST 15 - 41 U/L 12(L) 12(L) 13(L)  ALT 0 - 44 U/L 7 <6 7      RADIOGRAPHIC STUDIES: I have personally reviewed the radiological images as listed and agreed with the findings in the report. No results found.   ASSESSMENT & PLAN: Brittney Spencer is a 70 y.o. female with   1. Rectal adenocarcinoma, with liver and right pelvic metastasis, recurrence from previous colon cancer vs new primary   -Diagnosed on routine screening colonoscopy 05/2020, rectal mass biopsy showed invasive adenocarcinoma, arising from a tubular adenoma -Her 07/12/20 PET showed positive uptake of known rectal tumor and soft tissue mass within the right iliac fossa indicating recurrent prior colon cancer. There is also left lobe of liver lesion is FDG avid concerning for liver metastasis. pt declined liver biopsy -whether  this is metastatic rectal or recurrent/metastatic colon cancer, this is likely not curable, but still treatable. She previously declined referral for [facility] -her FO is still pending, will see if she is eligible for EGFR inhibitor -She began first-line systemic chemo with dose-reduced FOLFIRI on 08/06/20. -Bevacizumab added, and irinotecan/5FU increased with cycle 2, but due to poor tolerance/diarrhea irinotecan  was reduced with cycle 3 to 130 mg per metered squared   2. Cancer of ascending colon, [id number], stage IIIC, MSI-stable, (+) surgical margins at pelvic wall     -She was diagnosed in 12/2015. She is s/p right hemicolectomy with right salpingo oophorectomy and  adjuvant ChemoRT. -she declined adjuvant chemo due to the concern of side effects and impact on her quality of life. -Her 04/2019 CT scan was NED  -With recent rectal cancer diagnosis, her PET from 07/12/20 indicates soft tissue mass within the right iliac fossa is noted and consistent with local tumor recurrence from previous ascending colon tumor. -She declined option of liver biopsy.     3. Mild Anemia -Has been stable and mild for the past year.  -worse lately due to her newly diagnosed rectal cancer and starting chemo -denies bleeding -monitoring   4. HTN, Anxiety -She'll follow-up with her primary care physician and continue medication -She uses half tab Xanax as needed, mostly only on treatment days. -stable, not worse since her recent rectal cancer diagnosis    5. Hypokalemia -K at 3.3 on 07/28/20, on oral K -WNL today, continue supplement   Disposition: Brittney Spencer appears stable.  She has completed 9 cycles of FOLFIRI and bevacizumab.  She tolerates well with intermittent diarrhea.  She is able to manage with diet changes.  Otherwise no significant toxicities.  She is able to recover and function well.  Labs reviewed, CBC and CMP are stable, CEA remains normal.  There is no clinical evidence of disease progression.   Proceed with cycle 10 FOLFIRI and bevacizumab today as planned.   Follow up and next cycle in 2 weeks.   All questions were answered. The patient knows to call the clinic with any problems, questions or concerns. No barriers to learning were detected.     [name], NP 12/13/20

## 2020-12-14 ENCOUNTER — Telehealth: Payer: Self-pay | Admitting: Hematology

## 2020-12-14 NOTE — Telephone Encounter (Signed)
Left message with follow-up appointments per 6/15 los. 

## 2020-12-15 ENCOUNTER — Other Ambulatory Visit: Payer: Self-pay

## 2020-12-15 ENCOUNTER — Inpatient Hospital Stay: Payer: Federal, State, Local not specified - PPO

## 2020-12-15 VITALS — BP 121/67 | HR 69 | Temp 98.6°F | Resp 16

## 2020-12-15 DIAGNOSIS — C787 Secondary malignant neoplasm of liver and intrahepatic bile duct: Secondary | ICD-10-CM

## 2020-12-15 DIAGNOSIS — Z5112 Encounter for antineoplastic immunotherapy: Secondary | ICD-10-CM | POA: Diagnosis not present

## 2020-12-15 DIAGNOSIS — C20 Malignant neoplasm of rectum: Secondary | ICD-10-CM

## 2020-12-15 MED ORDER — PEGFILGRASTIM-JMDB 6 MG/0.6ML ~~LOC~~ SOSY
PREFILLED_SYRINGE | SUBCUTANEOUS | Status: AC
  Filled 2020-12-15: qty 0.6

## 2020-12-15 MED ORDER — SODIUM CHLORIDE 0.9% FLUSH
10.0000 mL | INTRAVENOUS | Status: DC | PRN
  Administered 2020-12-15: 10 mL
  Filled 2020-12-15: qty 10

## 2020-12-15 MED ORDER — PEGFILGRASTIM-JMDB 6 MG/0.6ML ~~LOC~~ SOSY
6.0000 mg | PREFILLED_SYRINGE | Freq: Once | SUBCUTANEOUS | Status: AC
Start: 2020-12-15 — End: 2020-12-15
  Administered 2020-12-15: 6 mg via SUBCUTANEOUS

## 2020-12-15 MED ORDER — HEPARIN SOD (PORK) LOCK FLUSH 100 UNIT/ML IV SOLN
500.0000 [IU] | Freq: Once | INTRAVENOUS | Status: AC | PRN
  Administered 2020-12-15: 500 [IU]
  Filled 2020-12-15: qty 5

## 2020-12-26 NOTE — Progress Notes (Signed)
[facility]   [phone]) [phone] [phone]   Clinic Follow up Note   Patient Care Team: Patient, No Pcp Per (Inactive) as PCP - General (General Practice) [name], NP as Nurse Practitioner (Oncology) [name], MD as Consulting Physician (Oncology) [name], RN as Oncology Nurse Navigator  Date of Service:  12/27/2020  CHIEF COMPLAINT:  f/u of H/o colon cancer and recently diagnosed metastatic rectal cancer  SUMMARY OF ONCOLOGIC HISTORY: Oncology History Overview Note  Cancer Staging Cancer of ascending colon [facility]) Staging form: Colon and Rectum, AJCC 7th Edition - Clinical stage from 02/07/2016: Stage IIIC (T4b, N1b, M0) - Signed by [name], MD on 03/04/2016    Cancer of ascending colon ([place])  10/25/2015 Imaging   CT ABD/PELVIS:  Inflammatory changes inferior to the cecal tip appear improved, there is still irregular soft tissue thickening of the cecal tip, and there are adjacent prominent lymph nodes in the ileocolonic mesentery, measuring 13 mm on image 49 and 8 mm on image 52. In addition, there is a 2.5 x 1.8 cm nodule on image 46 which has central low density. Therefore, these findings are moderately suspicious for an underlying cecal malignancy with perforation.     01/19/2016 Procedure   COLONOSCOPY per Dr. [name]: Fungating, ulcerated mass almost obstructing mid ascending colon    01/19/2016 Initial Biopsy   Diagnosis Surgical [P], cecal mass - INVASIVE ADENOCARCINOMA WITH ULCERATION. - SEE COMMENT.    02/05/2016 Tumor Marker   Patient's tumor was tested for the following markers: CEA Results of the tumor marker test revealed 5.7.    02/07/2016 Initial Diagnosis   Cancer of ascending colon ([place])    02/07/2016 Definitive Surgery   Laparoscopic assisted right hemicolectomy and right salpingo oopherectomy--Dr. [name]    02/07/2016 Pathologic Stage   p T4 N1b   2/43 nodes +    02/07/2016 Pathology Results   MMR  normal; G2 adenocarcinoma;proximal & distal margins negative; soft tissue mass on pelvic sidewall + for adenocarcinoma with positive margin MSI Stable    03/08/2016 Imaging   CT chest negative for metastasis.     03/19/2016 - 04/25/2016 Radiation Therapy   Adjuvant irradiation, 50 gray in 28 fractions    03/19/2016 - 04/22/2016 Chemotherapy   Xeloda 1500 mg twice daily, started on 03/19/2016, dose reduced to 1000 mg twice daily from week 3 due to neutropenia, and patient stopped 3 days before last dose radiation due to difficulty swallowing the pill    05/20/2016 -  Adjuvant Chemotherapy   Patient declined adjuvant chemotherapy    09/16/2016 Imaging   CT CAP w Contrast 1. No evidence of local tumor recurrence at the ileocolic anastomosis. 2. No findings suspicious for metastatic disease in the chest, abdomen or pelvis. 3. Nonspecific trace free fluid in the pelvic cul-de-sac. 4. Stable solitary 3 mm right upper lobe pulmonary nodule, for which 6 month stability has been demonstrated, probably benign. 5. Additional findings include stable right posterior pericardial cyst and small calcified uterine fibroids.    05/13/2017 Imaging   CT CAP W Contrast 05/13/17 IMPRESSION: 1. No current findings of residual or recurrent malignancy. 2. Mild prominence of stool throughout the colon. Nondistended portions of the rectum. 3. Several tiny pulmonary nodules are stable from the earliest available comparison of 03/08/2016 and probably benign, but may merit surveillance. 4. Other imaging findings of potential clinical significance: Old granulomatous disease. Aortoiliac atherosclerotic vascular disease. Lumbar spondylosis and degenerative disc disease. Stable amount of trace  free pelvic fluid.   04/27/2018 Imaging   04/27/2018 CT CAP IMPRESSION: Stable exam. No evidence of recurrent or metastatic carcinoma within the chest, abdomen, or pelvis   04/19/2019 Imaging   CT CAP W Contrast   IMPRESSION: Chest Impression:   1. No evidence of thoracic metastasis. 2. Stable small bilateral pulmonary nodules.   Abdomen / Pelvis Impression:   1. No evidence local colorectal carcinoma recurrence or metastasis in the abdomen pelvis. 2. Post RIGHT hemicolectomy.   Rectal cancer metastasized to liver ([place])  06/15/2020 Procedure   Screening Colonoscopy by Dr [name]  IMPRESSION - Decreased sphincter tone and internal hemorrhoids that prolapse with straining, but require manual replacement into the anal canal (Grade III) found on digital rectal exam. - Patent side-to-side ileo-colonic anastomosis, characterized by healthy appearing mucosa. - The examined portion of the ileum was normal. - One diminutive polyp in the proximal transverse colon, removed with a cold biopsy forceps. Resected and retrieved. - Likely malignant partially obstructing tumor in the mid rectum. Biopsied. Tattooed. - The examination was otherwise normal on direct and retroflexion views.   06/15/2020 Initial Biopsy   Diagnosis 1. Transverse Colon Polyp - HYPERPLASTIC POLYP 2. Rectum, biopsy - ADENOCARCINOMA ARISING IN A TUBULAR ADENOMA WITH HIGH-GRADE DYSPLASIA. SEE NOTE Diagnosis Note 2. Dr. [name] reviewed the case and concurs with the diagnosis. Dr. [name] was notified on 06/16/2020.   06/28/2020 Imaging   CT CAP  IMPRESSION: 1. New low-density focus in the anterior aspect of the lateral segment LEFT hepatic lobe measuring 1.2 x 1.0 cm, compatible with small metastatic lesion in the LEFT hepatic lobe. 2. Soft tissue in the RIGHT iliac fossa following RIGHT hemicolectomy invades the psoas musculature and is slowly enlarging over time, more linear on the prior study now highly concerning for recurrence/metastasis to this location. 3. Signs of enteritis, potentially post radiation changes of the small bowel. Tethered small bowel in the RIGHT lower quadrant shows focal thickening and narrowing  suspicious for small bowel involvement and developing partial obstruction though currently contrast passes beyond this point into the colon. 4. Rectal thickening in this patient with known rectal mass as described. 5. No evidence of metastatic disease in the chest. 6. Stable small pulmonary nodules. 7.  and aortic atherosclerosis.   Aortic Atherosclerosis (ICD10-I70.0) and Emphysema (ICD10-J43.9).   07/04/2020 Initial Diagnosis   Rectal cancer metastasized to liver ([place])   07/12/2020 PET scan   IMPRESSION: 1. Exam positive for FDG avid rectal tumor which corresponds to the recent colonoscopy findings. 2. FDG avid soft tissue mass within the right iliac fossa is noted and consistent with local tumor recurrence from previous ascending colon tumor. 3. Lateral segment left lobe of liver lesion is FDG avid concerning for liver metastasis. 4. No specific findings identified to suggest metastatic disease to the chest.   08/10/2020 -  Chemotherapy   First-line FOLFIRI q2weeks starting 08/10/20. dose reduced with cycle 1. Irinotecan/5FU increased and Bevacizumab added with cycle 2 on 08/23/2020    10/27/2020 Imaging   CT CAP  IMPRESSION: 1. Interval decrease in size of the hypermetabolic left hepatic lesion, consistent with metastatic disease. No new liver lesion evident. 2. Interval resolution of the hypermetabolic soft tissue lesion along the right iliac fossa with no measurable soft tissue lesion remaining at this location today. 3. Similar appearance of soft tissue fullness in the rectum at the site of the hypermetabolic lesion seen previously. 4. Stable tiny bilateral pulmonary nodules. Continued attention on follow-up recommended. 5. Small volume  free fluid in the pelvis. 6. Aortic Atherosclerosis (ICD10-I70.0).      CURRENT THERAPY:  First-line FOLFIRI q2weeks starting 08/10/20. dose reduced with cycle 1. Bevacizumab added with cycle 2 on 08/23/2020  INTERVAL  HISTORY: Brittney Spencer is here for a follow up. She was last seen by me 11/29/20. She presents to the clinic alone. She is here for cycle 11. She notes no issues tolerating the chemotherapy. The patient notes mild aching last week around the time of her GF  shot, but did not take the Claritin. The patient notes some intermittent bother in her leg, but was very active recently and climbing stairs/ walking on incline. She denies any back pain or other bodily pain.   REVIEW OF SYSTEMS:  Constitutional: Denies fevers, chills or abnormal weight loss Eyes: Denies blurriness of vision Ears, nose, mouth, throat, and face: Denies mucositis or sore throat Respiratory: Denies cough, dyspnea or wheezes Cardiovascular: Denies palpitation, chest discomfort or lower extremity swelling Gastrointestinal:  Denies nausea, heartburn or change in bowel habits Skin: Denies abnormal skin rashes Lymphatics: Denies new lymphadenopathy or easy bruising Neurological:Denies numbness, tingling or new weaknesses Behavioral/Psych: Mood is stable, no new changes  All other systems were reviewed with the patient and are negative.  MEDICAL HISTORY:  Past Medical History:  Diagnosis Date   AAA (abdominal aortic aneurysm) (HCC)    infrarenal 4.1 cmper s-9-19 scan on chart   Anemia    hx of   Anxiety    has PRN meds   Colon cancer ([place]) 2017   RIGHT hemi colectomy-s/p sx   GERD (gastroesophageal reflux disease)    OTC meds/diet control   Hypertension    on meds   Macular pucker, right eye 10/06/2019   Retinal detachment, right 09/2019   Vitamin D deficiency     SURGICAL HISTORY: Past Surgical History:  Procedure Laterality Date   COLONOSCOPY  2018   HD-hams   COLONSCOPY  12/2015   IR IMAGING GUIDED PORT INSERTION  08/04/2020   LAPAROSCOPIC RIGHT HEMI COLECTOMY Right 02/07/2016   Procedure: LAPAROSCOPIC ASSISTED RIGHT HEMI COLECTOMY AND RIGHT SALPINGO OOPHERECTOMY;  Surgeon: [name], MD;  Location: [facility]  [facility];  Service: General;  Laterality: Right;   RETINAL DETACHMENT SURGERY  09/2019    I have reviewed the social history and family history with the patient and they are unchanged from previous note.  ALLERGIES:  is allergic to fish allergy, peanut-containing drug products, soy allergy, and buspirone.  MEDICATIONS:  Current Outpatient Medications  Medication Sig Dispense Refill   ALPRAZolam (XANAX) 0.25 MG tablet Take 1 tablet (0.25 mg total) by mouth daily as needed for anxiety. 30 tablet 0   Cholecalciferol (VITAMIN D3 PO) Take by mouth daily.     diphenoxylate-atropine (LOMOTIL) 2.5-0.025 MG tablet Take 2 tablets by mouth 4 (four) times daily as needed for diarrhea or loose stools. 45 tablet 3   docusate sodium (COLACE) 100 MG capsule 1 capsule as needed     lidocaine-prilocaine (EMLA) cream Apply 1 application topically as needed. 30 g 1   NORVASC 2.5 MG tablet TAKE 1 TABLET BY MOUTH EVERY DAY 90 tablet 1   ondansetron (ZOFRAN) 8 MG tablet Take 1 tablet (8 mg total) by mouth every 8 (eight) hours as needed for nausea or vomiting. 20 tablet 2   Current Facility-Administered Medications  Medication Dose Route Frequency Provider Last Rate Last Admin   0.9 %  sodium chloride infusion  500 mL Intravenous Once Nelida Meuse III,  MD       Facility-Administered Medications Ordered in Other Visits  Medication Dose Route Frequency Provider Last Rate Last Admin   fluorouracil (ADRUCIL) 3,000 mg in sodium chloride 0.9 % 90 mL chemo infusion  2,170 mg/m2 (Order-Specific) Intravenous 1 day or 1 dose [name], MD   3,000 mg at 12/27/20 1540    PHYSICAL EXAMINATION: ECOG PERFORMANCE STATUS: 1 - Symptomatic but completely ambulatory  Vitals:   12/27/20 1138  BP: 127/75  Pulse: 72  Resp: 16  Temp: 97.9 F (36.6 C)  SpO2: 97%   Filed Weights   12/27/20 1138  Weight: 96 lb 4.8 oz (43.7 kg)    GENERAL:alert, no distress and comfortable SKIN: skin color, texture, turgor are normal, no  rashes or significant lesions EYES: normal, Conjunctiva are pink and non-injected, sclera clear  LYMPH:  no palpable lymphadenopathy in the cervical, axillary, or inguinal regions. ABDOMEN:abdomen soft, non-tender and normal bowel sounds NEURO: alert & oriented x 3 with fluent speech, no focal motor/sensory deficits  LABORATORY DATA:  I have reviewed the data as listed CBC Latest Ref Rng & Units 12/27/2020 12/13/2020 11/29/2020  WBC 4.0 - 10.5 K/uL 12.3(H) 9.3 12.9(H)  Hemoglobin 12.0 - 15.0 g/dL 8.8(L) 9.2(L) 9.0(L)  Hematocrit 36.0 - 46.0 % 28.3(L) 29.9(L) 29.3(L)  Platelets 150 - 400 K/uL 177 189 186     CMP Latest Ref Rng & Units 12/27/2020 12/13/2020 11/29/2020  Glucose 70 - 99 mg/dL 76 103(H) 101(H)  BUN 8 - 23 mg/dL '14 11 11  ' Creatinine 0.44 - 1.00 mg/dL 0.73 0.72 0.72  Sodium 135 - 145 mmol/L 140 139 139  Potassium 3.5 - 5.1 mmol/L 3.5 3.9 3.6  Chloride 98 - 111 mmol/L 107 108 105  CO2 22 - 32 mmol/L '25 23 24  ' Calcium 8.9 - 10.3 mg/dL 9.0 8.6(L) 8.7(L)  Total Protein 6.5 - 8.1 g/dL 6.7 6.6 6.4(L)  Total Bilirubin 0.3 - 1.2 mg/dL 0.2(L) 0.2(L) 0.3  Alkaline Phos 38 - 126 U/L 90 90 84  AST 15 - 41 U/L 13(L) 12(L) 12(L)  ALT 0 - 44 U/L 6 7 <6      RADIOGRAPHIC STUDIES: I have personally reviewed the radiological images as listed and agreed with the findings in the report. No results found.   ASSESSMENT & PLAN:  Brittney Spencer is a 70 y.o. female with   1. Rectal adenocarcinoma, with liver and right pelvic metastasis, recurrence from previous colon cancer vs new primary   -Diagnosed on routine screening colonoscopy 05/2020, rectal mass biopsy showed invasive adenocarcinoma, arising from a tubular adenoma -Her 07/12/20 PET showed positive uptake of known rectal tumor and soft tissue mass within the right iliac fossa indicating recurrent prior colon cancer. There is also left lobe of liver lesion is FDG avid concerning for liver metastasis. pt declined liver biopsy -Whether  this is metastatic rectal or recurrent/metastatic colon cancer, this is likely not curable, but still treatable. She previously declined referral for HIPEC -She began first-line systemic chemo with dose-reduced FOLFIRI on 08/06/20. Bevacizumab added, and irinotecan/5FU increased with cycle 2, but due to poor tolerance/diarrhea irinotecan was dose reduced with cycle 3 and she has been tolerating well. -Her CEA and 10/27/20 CT CAP has shown good response.  -With C8 she had pump malfunction. She had to stop pump early, but was able to complete the next day. She tolerated well. -Recommended pt take Claritin once daily for 5 days starting on day of injection. -Will get repeat scan in  3-4 weeks as pt is due for her scans. -Labs reviewed. CBC stable, urine normal. Slightly more anemic but no blood transfusion needed. Adequate to proceed with C11 FOLFIRI and continue every 2 weeks  -The patient notes that she will be out of town on August 1-6.   2. Cancer of ascending colon, pT4bN1bM0, stage IIIC, MSI-stable, (+) surgical margins at pelvic wall     -She was diagnosed in 12/2015. She is s/p right hemicolectomy with right salpingo oophorectomy and  adjuvant ChemoRT. -she declined adjuvant chemo due to the concern of side effects and impact on her quality of life. -Her 04/2019 CT scan was NED  -With recent rectal cancer diagnosis, her PET from 07/12/20 indicates soft tissue mass within the right iliac fossa is noted and consistent with local tumor recurrence from previous ascending colon tumor. -She previously declined option of liver biopsy.     3. Mild Anemia -Has been stable and mild for the past year.  -worse lately due to her newly diagnosed rectal cancer and starting chemo. Denies overt GI bleeding. -Moderate and stable lately.    4. HTN, Anxiety -She'll follow-up with her primary care physician and continue medication -She uses Xanax as needed. She is stable.  -She continues to cope with her diagnosis  and treatment.    5. Hypokalemia -She notes she is not taking K supplement. She instead increased potassium in diet.  -K normal today      Plan:  -Reviewed labs, CBC stable, urine normal Okay to proceed with C11 FOLFIRI. -Will refer pt for Evusheld. -Will get CT scan a few days prior to 07/25 -Will move 07/27 appt to 07/25 to help pt recover prior to trip. -Lab, flush, f/u, and FOLFIRI and Beva on 08/15. -Recommended pt receive the COVID boosters.     No problem-specific Assessment & Plan notes found for this encounter.   Orders Placed This Encounter  Procedures   CT CHEST ABDOMEN PELVIS W CONTRAST    Standing Status:   Future    Standing Expiration Date:   12/27/2021    Order Specific Question:   If indicated for the ordered procedure, I authorize the administration of contrast media per Radiology protocol    Answer:   Yes    Order Specific Question:   Preferred imaging location?    Answer:   [facility]    Order Specific Question:   Release to patient    Answer:   Immediate    Order Specific Question:   Is Oral Contrast requested for this exam?    Answer:   Yes, Per Radiology protocol    Order Specific Question:   Reason for Exam (SYMPTOM  OR DIAGNOSIS REQUIRED)    Answer:   evaluate response to chemo   Ambulatory Referral for Evusheld    Referral Priority:   Routine    Referral Type:   Consultation    Referral Reason:   Specialty Services Required    Number of Visits Requested:   1    All questions were answered. The patient knows to call the clinic with any problems, questions or concerns. No barriers to learning was detected. The total time spent in the appointment was 30 minutes.     [name], MD 12/27/2020   I, Reinaldo Raddle, am acting as scribe for Dr. [name], MD.

## 2020-12-27 ENCOUNTER — Encounter: Payer: Self-pay | Admitting: Hematology

## 2020-12-27 ENCOUNTER — Inpatient Hospital Stay: Payer: Federal, State, Local not specified - PPO

## 2020-12-27 ENCOUNTER — Inpatient Hospital Stay: Payer: Federal, State, Local not specified - PPO | Admitting: Nutrition

## 2020-12-27 ENCOUNTER — Other Ambulatory Visit: Payer: Self-pay

## 2020-12-27 ENCOUNTER — Inpatient Hospital Stay: Payer: Federal, State, Local not specified - PPO | Admitting: Hematology

## 2020-12-27 VITALS — BP 127/75 | HR 72 | Temp 97.9°F | Resp 16 | Ht 66.0 in | Wt 96.3 lb

## 2020-12-27 DIAGNOSIS — C182 Malignant neoplasm of ascending colon: Secondary | ICD-10-CM | POA: Diagnosis not present

## 2020-12-27 DIAGNOSIS — Z95828 Presence of other vascular implants and grafts: Secondary | ICD-10-CM

## 2020-12-27 DIAGNOSIS — C20 Malignant neoplasm of rectum: Secondary | ICD-10-CM

## 2020-12-27 DIAGNOSIS — I10 Essential (primary) hypertension: Secondary | ICD-10-CM | POA: Diagnosis not present

## 2020-12-27 DIAGNOSIS — Z5112 Encounter for antineoplastic immunotherapy: Secondary | ICD-10-CM | POA: Diagnosis not present

## 2020-12-27 DIAGNOSIS — C787 Secondary malignant neoplasm of liver and intrahepatic bile duct: Secondary | ICD-10-CM

## 2020-12-27 DIAGNOSIS — D500 Iron deficiency anemia secondary to blood loss (chronic): Secondary | ICD-10-CM | POA: Diagnosis not present

## 2020-12-27 LAB — CMP (CANCER CENTER ONLY)
ALT: 6 U/L (ref 0–44)
AST: 13 U/L — ABNORMAL LOW (ref 15–41)
Albumin: 3.3 g/dL — ABNORMAL LOW (ref 3.5–5.0)
Alkaline Phosphatase: 90 U/L (ref 38–126)
Anion gap: 8 (ref 5–15)
BUN: 14 mg/dL (ref 8–23)
CO2: 25 mmol/L (ref 22–32)
Calcium: 9 mg/dL (ref 8.9–10.3)
Chloride: 107 mmol/L (ref 98–111)
Creatinine: 0.73 mg/dL (ref 0.44–1.00)
GFR, Estimated: >60 mL/min (ref >60)
Glucose, Bld: 76 mg/dL (ref 70–99)
Potassium: 3.5 mmol/L (ref 3.5–5.1)
Sodium: 140 mmol/L (ref 135–145)
Total Bilirubin: 0.2 mg/dL — ABNORMAL LOW (ref 0.3–1.2)
Total Protein: 6.7 g/dL (ref 6.5–8.1)

## 2020-12-27 LAB — CBC WITH DIFFERENTIAL (CANCER CENTER ONLY)
Abs Immature Granulocytes: 0.08 10*3/uL — ABNORMAL HIGH (ref 0.00–0.07)
Basophils Absolute: 0 10*3/uL (ref 0.0–0.1)
Basophils Relative: 0 %
Eosinophils Absolute: 0.1 10*3/uL (ref 0.0–0.5)
Eosinophils Relative: 0 %
HCT: 28.3 % — ABNORMAL LOW (ref 36.0–46.0)
Hemoglobin: 8.8 g/dL — ABNORMAL LOW (ref 12.0–15.0)
Immature Granulocytes: 1 %
Lymphocytes Relative: 11 %
Lymphs Abs: 1.4 10*3/uL (ref 0.7–4.0)
MCH: 26.7 pg (ref 26.0–34.0)
MCHC: 31.1 g/dL (ref 30.0–36.0)
MCV: 86 fL (ref 80.0–100.0)
Monocytes Absolute: 0.9 10*3/uL (ref 0.1–1.0)
Monocytes Relative: 7 %
Neutro Abs: 9.9 10*3/uL — ABNORMAL HIGH (ref 1.7–7.7)
Neutrophils Relative %: 81 %
Platelet Count: 177 10*3/uL (ref 150–400)
RBC: 3.29 MIL/uL — ABNORMAL LOW (ref 3.87–5.11)
RDW: 16.5 % — ABNORMAL HIGH (ref 11.5–15.5)
WBC Count: 12.3 10*3/uL — ABNORMAL HIGH (ref 4.0–10.5)
nRBC: 0 % (ref 0.0–0.2)

## 2020-12-27 LAB — TOTAL PROTEIN, URINE DIPSTICK: Protein, ur: <30 mg/dL — AB

## 2020-12-27 MED ORDER — SODIUM CHLORIDE 0.9 % IV SOLN
2170.0000 mg/m2 | INTRAVENOUS | Status: DC
  Administered 2020-12-27: 3000 mg via INTRAVENOUS
  Filled 2020-12-27: qty 60

## 2020-12-27 MED ORDER — SODIUM CHLORIDE 0.9 % IV SOLN
200.0000 mg | Freq: Once | INTRAVENOUS | Status: AC
  Administered 2020-12-27: 200 mg via INTRAVENOUS
  Filled 2020-12-27: qty 8

## 2020-12-27 MED ORDER — PALONOSETRON HCL INJECTION 0.25 MG/5ML
0.2500 mg | Freq: Once | INTRAVENOUS | Status: AC
  Administered 2020-12-27: 0.25 mg via INTRAVENOUS

## 2020-12-27 MED ORDER — ATROPINE SULFATE 1 MG/ML IJ SOLN
INTRAMUSCULAR | Status: AC
  Filled 2020-12-27: qty 1

## 2020-12-27 MED ORDER — PALONOSETRON HCL INJECTION 0.25 MG/5ML
INTRAVENOUS | Status: AC
  Filled 2020-12-27: qty 5

## 2020-12-27 MED ORDER — SODIUM CHLORIDE 0.9% FLUSH
10.0000 mL | Freq: Once | INTRAVENOUS | Status: AC
  Administered 2020-12-27: 10 mL
  Filled 2020-12-27: qty 10

## 2020-12-27 MED ORDER — SODIUM CHLORIDE 0.9 % IV SOLN
10.0000 mg | Freq: Once | INTRAVENOUS | Status: AC
  Administered 2020-12-27: 10 mg via INTRAVENOUS
  Filled 2020-12-27: qty 10

## 2020-12-27 MED ORDER — ATROPINE SULFATE 1 MG/ML IJ SOLN
0.5000 mg | Freq: Once | INTRAMUSCULAR | Status: AC | PRN
  Administered 2020-12-27: 0.5 mg via INTRAVENOUS

## 2020-12-27 MED ORDER — SODIUM CHLORIDE 0.9 % IV SOLN
400.0000 mg/m2 | Freq: Once | INTRAVENOUS | Status: AC
  Administered 2020-12-27: 556 mg via INTRAVENOUS
  Filled 2020-12-27: qty 27.8

## 2020-12-27 MED ORDER — SODIUM CHLORIDE 0.9 % IV SOLN
Freq: Once | INTRAVENOUS | Status: AC
  Filled 2020-12-27: qty 250

## 2020-12-27 MED ORDER — SODIUM CHLORIDE 0.9 % IV SOLN
130.0000 mg/m2 | Freq: Once | INTRAVENOUS | Status: AC
  Administered 2020-12-27: 180 mg via INTRAVENOUS
  Filled 2020-12-27: qty 9

## 2020-12-27 NOTE — Progress Notes (Signed)
Nutrition follow-up completed with patient during infusion for metastatic rectal cancer. Weight stable at 96 pounds. Patient reports her stools have improved and are now with soft texture instead of diarrhea.  She has added some soluble fiber to her diet and feels much better. Reports she does drink protein shakes.  Overall she feels much better and has no questions or concerns today.  Nutrition diagnosis: Food and nutrition related knowledge deficit has resolved.  Encourage patient to continue following appropriate diet for bowel management.  Continue protein shakes for added calories and protein.  Patient has contact information for questions or concerns.

## 2020-12-27 NOTE — Patient Instructions (Signed)

## 2020-12-27 NOTE — Patient Instructions (Signed)
[facility] ONCOLOGY   Discharge Instructions: Thank you for choosing [facility] to provide your oncology and hematology care.   If you have a lab appointment with the [facility], please go directly to the [facility] and check in at the registration area.   Wear comfortable clothing and clothing appropriate for easy access to any Portacath or PICC line.   We strive to give you quality time with your provider. You may need to reschedule your appointment if you arrive late (15 or more minutes).  Arriving late affects you and other patients whose appointments are after yours.  Also, if you miss three or more appointments without notifying the office, you may be dismissed from the clinic at the provider's discretion.      For prescription refill requests, have your pharmacy contact our office and allow 72 hours for refills to be completed.    Today you received the following chemotherapy and/or immunotherapy agents: Bevacizumab (Avastin), Irinotecan, Leucovorin, and Fluorouracil       To help prevent nausea and vomiting after your treatment, we encourage you to take your nausea medication as directed.  BELOW ARE SYMPTOMS THAT SHOULD BE REPORTED IMMEDIATELY: *FEVER GREATER THAN 100.4 F (38 C) OR HIGHER *CHILLS OR SWEATING *NAUSEA AND VOMITING THAT IS NOT CONTROLLED WITH YOUR NAUSEA MEDICATION *UNUSUAL SHORTNESS OF BREATH *UNUSUAL BRUISING OR BLEEDING *URINARY PROBLEMS (pain or burning when urinating, or frequent urination) *BOWEL PROBLEMS (unusual diarrhea, constipation, pain near the anus) TENDERNESS IN MOUTH AND THROAT WITH OR WITHOUT PRESENCE OF ULCERS (sore throat, sores in mouth, or a toothache) UNUSUAL RASH, SWELLING OR PAIN  UNUSUAL VAGINAL DISCHARGE OR ITCHING   Items with * indicate a potential emergency and should be followed up as soon as possible or go to the Emergency Department if any problems should occur.  Please show the  CHEMOTHERAPY ALERT CARD or IMMUNOTHERAPY ALERT CARD at check-in to the Emergency Department and triage nurse.  Should you have questions after your visit or need to cancel or reschedule your appointment, please contact [facility]  Dept: [phone]  and follow the prompts.  Office hours are 8:00 a.m. to 4:30 p.m. Monday - Friday. Please note that voicemails left after 4:00 p.m. may not be returned until the following business day.  We are closed weekends and major holidays. You have access to a nurse at all times for urgent questions. Please call the main number to the clinic Dept: [phone] and follow the prompts.   For any non-urgent questions, you may also contact your provider using MyChart. We now offer e-Visits for anyone 62 and older to request care online for non-urgent symptoms. For details visit [email or web address].   Also download the MyChart app! Go to the app store, search "MyChart", open the app, select [facility], and log in with your MyChart username and password.  Due to Covid, a mask is required upon entering the hospital/clinic. If you do not have a mask, one will be given to you upon arrival. For doctor visits, patients may have 1 support person aged 39 or older with them. For treatment visits, patients cannot have anyone with them due to current Covid guidelines and our immunocompromised population.

## 2020-12-27 NOTE — Progress Notes (Signed)
Ok per Dr. Burr Medico to adj Bevacizumab dose today using most recent weight.  Kennith Center, Pharm.D., CPP 12/27/2020@1 :14 PM

## 2020-12-29 ENCOUNTER — Inpatient Hospital Stay: Payer: Federal, State, Local not specified - PPO | Attending: Nurse Practitioner

## 2020-12-29 ENCOUNTER — Other Ambulatory Visit: Payer: Self-pay

## 2020-12-29 VITALS — BP 128/75 | HR 63 | Temp 98.3°F | Resp 18

## 2020-12-29 DIAGNOSIS — D649 Anemia, unspecified: Secondary | ICD-10-CM | POA: Insufficient documentation

## 2020-12-29 DIAGNOSIS — Z5112 Encounter for antineoplastic immunotherapy: Secondary | ICD-10-CM | POA: Insufficient documentation

## 2020-12-29 DIAGNOSIS — E876 Hypokalemia: Secondary | ICD-10-CM | POA: Diagnosis not present

## 2020-12-29 DIAGNOSIS — C182 Malignant neoplasm of ascending colon: Secondary | ICD-10-CM | POA: Diagnosis present

## 2020-12-29 DIAGNOSIS — Z9221 Personal history of antineoplastic chemotherapy: Secondary | ICD-10-CM | POA: Insufficient documentation

## 2020-12-29 DIAGNOSIS — F419 Anxiety disorder, unspecified: Secondary | ICD-10-CM | POA: Insufficient documentation

## 2020-12-29 DIAGNOSIS — C787 Secondary malignant neoplasm of liver and intrahepatic bile duct: Secondary | ICD-10-CM | POA: Insufficient documentation

## 2020-12-29 DIAGNOSIS — Z5189 Encounter for other specified aftercare: Secondary | ICD-10-CM | POA: Diagnosis not present

## 2020-12-29 DIAGNOSIS — Z452 Encounter for adjustment and management of vascular access device: Secondary | ICD-10-CM | POA: Insufficient documentation

## 2020-12-29 DIAGNOSIS — Z95828 Presence of other vascular implants and grafts: Secondary | ICD-10-CM

## 2020-12-29 DIAGNOSIS — Z923 Personal history of irradiation: Secondary | ICD-10-CM | POA: Diagnosis not present

## 2020-12-29 DIAGNOSIS — Z5111 Encounter for antineoplastic chemotherapy: Secondary | ICD-10-CM | POA: Diagnosis present

## 2020-12-29 DIAGNOSIS — I10 Essential (primary) hypertension: Secondary | ICD-10-CM | POA: Insufficient documentation

## 2020-12-29 MED ORDER — HEPARIN SOD (PORK) LOCK FLUSH 100 UNIT/ML IV SOLN
500.0000 [IU] | Freq: Once | INTRAVENOUS | Status: AC
  Administered 2020-12-29: 500 [IU]
  Filled 2020-12-29: qty 5

## 2020-12-29 MED ORDER — SODIUM CHLORIDE 0.9% FLUSH
10.0000 mL | Freq: Once | INTRAVENOUS | Status: AC
  Administered 2020-12-29: 10 mL
  Filled 2020-12-29: qty 10

## 2020-12-29 NOTE — Patient Instructions (Signed)

## 2021-01-02 ENCOUNTER — Telehealth: Payer: Self-pay | Admitting: Hematology

## 2021-01-02 ENCOUNTER — Telehealth: Payer: Self-pay | Admitting: Adult Health

## 2021-01-02 NOTE — Telephone Encounter (Signed)
Left message with follow-up appointments per 6/29 los. 

## 2021-01-02 NOTE — Telephone Encounter (Signed)
Called and spoke with patient about Evusheld for COVID 19 prevention.  She and I discussed risks and benefits.  I reviewed needing to separate evusheld from her covid 19 vaccine booster.  She is going to review scheduling and will call me back in 1-2 days once she looks at her calendar and decides if she wants to pursue this injection.  Wilber Bihari, NP

## 2021-01-05 ENCOUNTER — Telehealth: Payer: Self-pay | Admitting: Hematology

## 2021-01-05 NOTE — Telephone Encounter (Signed)
R/s appts per 7/8 sch msg. Pt said she prefers Wednesdays still. Pt aware of updated appts days and times.

## 2021-01-09 NOTE — Progress Notes (Addendum)
[facility]   Telephone:([phone]) [phone] [phone]   Clinic Follow up Note   Patient Care Team: Patient, No Pcp Per (Inactive) as PCP - General (General Practice) [name], NP as Nurse Practitioner (Oncology) [name], MD as Consulting Physician (Oncology) [name], RN (Inactive) as Oncology Nurse Navigator 01/10/2021  CHIEF COMPLAINT: Follow-up colon cancer and metastatic rectal cancer  SUMMARY OF ONCOLOGIC HISTORY: Oncology History Overview Note  Cancer Staging Cancer of ascending colon [facility]) Staging form: Colon and Rectum, AJCC 7th Edition - Clinical stage from 02/07/2016: Stage IIIC (T4b, N1b, M0) - Signed by [name], MD on 03/04/2016    Cancer of ascending colon ([place])  10/25/2015 Imaging   CT ABD/PELVIS:  Inflammatory changes inferior to the cecal tip appear improved, there is still irregular soft tissue thickening of the cecal tip, and there are adjacent prominent lymph nodes in the ileocolonic mesentery, measuring 13 mm on image 49 and 8 mm on image 52. In addition, there is a 2.5 x 1.8 cm nodule on image 46 which has central low density. Therefore, these findings are moderately suspicious for an underlying cecal malignancy with perforation.     01/19/2016 Procedure   COLONOSCOPY per Dr. [name]: Fungating, ulcerated mass almost obstructing mid ascending colon    01/19/2016 Initial Biopsy   Diagnosis Surgical [P], cecal mass - INVASIVE ADENOCARCINOMA WITH ULCERATION. - SEE COMMENT.    02/05/2016 Tumor Marker   Patient's tumor was tested for the following markers: CEA Results of the tumor marker test revealed 5.7.    02/07/2016 Initial Diagnosis   Cancer of ascending colon ([place])    02/07/2016 Definitive Surgery   Laparoscopic assisted right hemicolectomy and right salpingo oopherectomy--Dr. [name]    02/07/2016 Pathologic Stage   p T4 N1b   2/43 nodes +    02/07/2016 Pathology Results   MMR normal; G2  adenocarcinoma;proximal & distal margins negative; soft tissue mass on pelvic sidewall + for adenocarcinoma with positive margin MSI Stable    03/08/2016 Imaging   CT chest negative for metastasis.     03/19/2016 - 04/25/2016 Radiation Therapy   Adjuvant irradiation, 50 gray in 28 fractions    03/19/2016 - 04/22/2016 Chemotherapy   Xeloda 1500 mg twice daily, started on 03/19/2016, dose reduced to 1000 mg twice daily from week 3 due to neutropenia, and patient stopped 3 days before last dose radiation due to difficulty swallowing the pill    05/20/2016 -  Adjuvant Chemotherapy   Patient declined adjuvant chemotherapy    09/16/2016 Imaging   CT CAP w Contrast 1. No evidence of local tumor recurrence at the ileocolic anastomosis. 2. No findings suspicious for metastatic disease in the chest, abdomen or pelvis. 3. Nonspecific trace free fluid in the pelvic cul-de-sac. 4. Stable solitary 3 mm right upper lobe pulmonary nodule, for which 6 month stability has been demonstrated, probably benign. 5. Additional findings include stable right posterior pericardial cyst and small calcified uterine fibroids.    05/13/2017 Imaging   CT CAP W Contrast 05/13/17 IMPRESSION: 1. No current findings of residual or recurrent malignancy. 2. Mild prominence of stool throughout the colon. Nondistended portions of the rectum. 3. Several tiny pulmonary nodules are stable from the earliest available comparison of 03/08/2016 and probably benign, but may merit surveillance. 4. Other imaging findings of potential clinical significance: Old granulomatous disease. Aortoiliac atherosclerotic vascular disease. Lumbar spondylosis and degenerative disc disease. Stable amount of trace free pelvic fluid.   04/27/2018 Imaging  04/27/2018 CT CAP IMPRESSION: Stable exam. No evidence of recurrent or metastatic carcinoma within the chest, abdomen, or pelvis   04/19/2019 Imaging   CT CAP W Contrast   IMPRESSION: Chest Impression:   1. No evidence of thoracic metastasis. 2. Stable small bilateral pulmonary nodules.   Abdomen / Pelvis Impression:   1. No evidence local colorectal carcinoma recurrence or metastasis in the abdomen pelvis. 2. Post RIGHT hemicolectomy.   Rectal cancer metastasized to liver ([place])  06/15/2020 Procedure   Screening Colonoscopy by Dr [name]  IMPRESSION - Decreased sphincter tone and internal hemorrhoids that prolapse with straining, but require manual replacement into the anal canal (Grade III) found on digital rectal exam. - Patent side-to-side ileo-colonic anastomosis, characterized by healthy appearing mucosa. - The examined portion of the ileum was normal. - One diminutive polyp in the proximal transverse colon, removed with a cold biopsy forceps. Resected and retrieved. - Likely malignant partially obstructing tumor in the mid rectum. Biopsied. Tattooed. - The examination was otherwise normal on direct and retroflexion views.   06/15/2020 Initial Biopsy   Diagnosis 1. Transverse Colon Polyp - HYPERPLASTIC POLYP 2. Rectum, biopsy - ADENOCARCINOMA ARISING IN A TUBULAR ADENOMA WITH HIGH-GRADE DYSPLASIA. SEE NOTE Diagnosis Note 2. Dr. [name] reviewed the case and concurs with the diagnosis. Dr. [name] was notified on 06/16/2020.   06/28/2020 Imaging   CT CAP  IMPRESSION: 1. New low-density focus in the anterior aspect of the lateral segment LEFT hepatic lobe measuring 1.2 x 1.0 cm, compatible with small metastatic lesion in the LEFT hepatic lobe. 2. Soft tissue in the RIGHT iliac fossa following RIGHT hemicolectomy invades the psoas musculature and is slowly enlarging over time, more linear on the prior study now highly concerning for recurrence/metastasis to this location. 3. Signs of enteritis, potentially post radiation changes of the small bowel. Tethered small bowel in the RIGHT lower quadrant shows focal thickening and narrowing  suspicious for small bowel involvement and developing partial obstruction though currently contrast passes beyond this point into the colon. 4. Rectal thickening in this patient with known rectal mass as described. 5. No evidence of metastatic disease in the chest. 6. Stable small pulmonary nodules. 7.  and aortic atherosclerosis.   Aortic Atherosclerosis (ICD10-I70.0) and Emphysema (ICD10-J43.9).   07/04/2020 Initial Diagnosis   Rectal cancer metastasized to liver ([place])   07/12/2020 PET scan   IMPRESSION: 1. Exam positive for FDG avid rectal tumor which corresponds to the recent colonoscopy findings. 2. FDG avid soft tissue mass within the right iliac fossa is noted and consistent with local tumor recurrence from previous ascending colon tumor. 3. Lateral segment left lobe of liver lesion is FDG avid concerning for liver metastasis. 4. No specific findings identified to suggest metastatic disease to the chest.   08/10/2020 -  Chemotherapy   First-line FOLFIRI q2weeks starting 08/10/20. dose reduced with cycle 1. Irinotecan/5FU increased and Bevacizumab added with cycle 2 on 08/23/2020    10/27/2020 Imaging   CT CAP  IMPRESSION: 1. Interval decrease in size of the hypermetabolic left hepatic lesion, consistent with metastatic disease. No new liver lesion evident. 2. Interval resolution of the hypermetabolic soft tissue lesion along the right iliac fossa with no measurable soft tissue lesion remaining at this location today. 3. Similar appearance of soft tissue fullness in the rectum at the site of the hypermetabolic lesion seen previously. 4. Stable tiny bilateral pulmonary nodules. Continued attention on follow-up recommended. 5. Small volume free fluid in the pelvis. 6. Aortic Atherosclerosis (ICD10-I70.0).  CURRENT THERAPY: First-line FOLFIRI q2weeks starting 08/10/20. dose reduced with cycle 1. Bevacizumab added with cycle 2 on 08/23/2020  INTERVAL HISTORY: Ms. Tarquinio  returns for follow-up and treatment as scheduled.  She was last seen 12/27/2020 and completed another cycle of FOLFIRI and bevacizumab.  Fulphila was held.  She is doing well, energy and appetite are adequate.  For 1-2 days after treatment she is more nauseated at times, with gagging but no vomiting.  She does not require medication.  This only limits her p.o. intake for a day.  She has mild constipation for few days after treatment, takes MiraLAX as needed which helps.  She is having soft bowel movements on dietary fiber, much less diarrhea these days.  It takes her body a day or so after chemo to get "regulated."  She has occasional right leg "tugging" sensation, not always associated with movement or activity.  Denies new or worsening pain.  Denies significant fever, chills, cough, chest pain, dyspnea, leg edema, or new concerns.    MEDICAL HISTORY:  Past Medical History:  Diagnosis Date   AAA (abdominal aortic aneurysm) (HCC)    infrarenal 4.1 cmper s-9-19 scan on chart   Anemia    hx of   Anxiety    has PRN meds   Colon cancer ([place]) 2017   RIGHT hemi colectomy-s/p sx   GERD (gastroesophageal reflux disease)    OTC meds/diet control   Hypertension    on meds   Macular pucker, right eye 10/06/2019   Retinal detachment, right 09/2019   Vitamin D deficiency     SURGICAL HISTORY: Past Surgical History:  Procedure Laterality Date   COLONOSCOPY  2018   [facility]   COLONSCOPY  12/2015   IR IMAGING GUIDED PORT INSERTION  08/04/2020   LAPAROSCOPIC RIGHT HEMI COLECTOMY Right 02/07/2016   Procedure: LAPAROSCOPIC ASSISTED RIGHT HEMI COLECTOMY AND RIGHT SALPINGO OOPHERECTOMY;  Surgeon: [name], MD;  Location: [facility];  Service: General;  Laterality: Right;   RETINAL DETACHMENT SURGERY  09/2019    I have reviewed the social history and family history with the patient and they are unchanged from previous note.  ALLERGIES:  is allergic to fish allergy, peanut-containing drug products, soy  allergy, and buspirone.  MEDICATIONS:  Current Outpatient Medications  Medication Sig Dispense Refill   ALPRAZolam (XANAX) 0.25 MG tablet Take 1 tablet (0.25 mg total) by mouth daily as needed for anxiety. 30 tablet 0   Cholecalciferol (VITAMIN D3 PO) Take by mouth daily.     diphenoxylate-atropine (LOMOTIL) 2.5-0.025 MG tablet Take 2 tablets by mouth 4 (four) times daily as needed for diarrhea or loose stools. 45 tablet 3   docusate sodium (COLACE) 100 MG capsule 1 capsule as needed     lidocaine-prilocaine (EMLA) cream Apply 1 application topically as needed. 30 g 1   NORVASC 2.5 MG tablet TAKE 1 TABLET BY MOUTH EVERY DAY 90 tablet 1   ondansetron (ZOFRAN) 8 MG tablet Take 1 tablet (8 mg total) by mouth every 8 (eight) hours as needed for nausea or vomiting. 20 tablet 2   Current Facility-Administered Medications  Medication Dose Route Frequency Provider Last Rate Last Admin   0.9 %  sodium chloride infusion  500 mL Intravenous Once Doran Stabler, MD       Facility-Administered Medications Ordered in Other Visits  Medication Dose Route Frequency Provider Last Rate Last Admin   fluorouracil (ADRUCIL) 3,000 mg in sodium chloride 0.9 % 90 mL chemo infusion  2,170  mg/m2 (Order-Specific) Intravenous 1 day or 1 dose [name], MD       irinotecan (CAMPTOSAR) 180 mg in sodium chloride 0.9 % 500 mL chemo infusion  130 mg/m2 (Order-Specific) Intravenous Once [name], MD 339 mL/hr at 01/10/21 1319 180 mg at 01/10/21 1319   leucovorin 556 mg in sodium chloride 0.9 % 250 mL infusion  400 mg/m2 (Order-Specific) Intravenous Once [name], MD 185 mL/hr at 01/10/21 1322 556 mg at 01/10/21 1322   sodium chloride flush (NS) 0.9 % injection 10 mL  10 mL Intracatheter PRN [name], MD        PHYSICAL EXAMINATION: ECOG PERFORMANCE STATUS: 1 - Symptomatic but completely ambulatory  Vitals:   01/10/21 1050  BP: 126/69  Pulse: 67  Resp: 18  Temp: 97.7 F (36.5 C)  SpO2: 100%   Filed Weights    01/10/21 1050  Weight: 95 lb 9.6 oz (43.4 kg)    GENERAL:alert, no distress and comfortable SKIN: no rash  EYES: sclera clear LUNGS: normal breathing effort HEART:  no lower extremity edema NEURO: alert & oriented x 3 with fluent speech, no focal motor/sensory deficits PAC without erythema   LABORATORY DATA:  I have reviewed the data as listed CBC Latest Ref Rng & Units 01/10/2021 12/27/2020 12/13/2020  WBC 4.0 - 10.5 K/uL 2.9(L) 12.3(H) 9.3  Hemoglobin 12.0 - 15.0 g/dL 8.9(L) 8.8(L) 9.2(L)  Hematocrit 36.0 - 46.0 % 28.8(L) 28.3(L) 29.9(L)  Platelets 150 - 400 K/uL 206 177 189     CMP Latest Ref Rng & Units 01/10/2021 12/27/2020 12/13/2020  Glucose 70 - 99 mg/dL 82 76 103(H)  BUN 8 - 23 mg/dL _0 Creatinine 0.44 - 1.00 mg/dL 0.70 0.73 0.72  Sodium 135 - 145 mmol/L 139 140 139  Potassium 3.5 - 5.1 mmol/L 3.8 3.5 3.9  Chloride 98 - 111 mmol/L 106 107 108  CO2 22 - 32 mmol/L _1 Calcium 8.9 - 10.3 mg/dL 8.8(L) 9.0 8.6(L)  Total Protein 6.5 - 8.1 g/dL 6.4(L) 6.7 6.6  Total Bilirubin 0.3 - 1.2 mg/dL 0.3 0.2(L) 0.2(L)  Alkaline Phos 38 - 126 U/L 62 90 90  AST 15 - 41 U/L 12(L) 13(L) 12(L)  ALT 0 - 44 U/L _2 RADIOGRAPHIC STUDIES: I have personally reviewed the radiological images as listed and agreed with the findings in the report. No results found.   ASSESSMENT & PLAN: SIRENITY SHEW is a 70 y.o. female with   1. Rectal adenocarcinoma, with liver and right pelvic metastasis, recurrence from previous colon cancer vs new primary   -Diagnosed on routine screening colonoscopy 05/2020, rectal mass biopsy showed invasive adenocarcinoma, arising from a tubular adenoma -Her 07/12/20 PET showed positive uptake of known rectal tumor and soft tissue mass within the right iliac fossa indicating recurrent prior colon cancer. There is also left lobe of liver lesion is FDG avid concerning for liver metastasis. pt declined liver biopsy -whether this is metastatic  rectal or recurrent/metastatic colon cancer, this is likely not curable, but still treatable. She previously declined referral for HIPEC -her FO is still pending, will see if she is eligible for EGFR inhibitor -She began first-line systemic chemo with dose-reduced FOLFIRI on 08/06/20. -Bevacizumab added, and irinotecan/5FU increased with cycle 2, but due to poor tolerance/diarrhea irinotecan was reduced with cycle 3 to 130 mg per metered squared   2. Cancer of ascending colon, [id number], stage IIIC, MSI-stable, (+) surgical margins at  pelvic wall     -She was diagnosed in 12/2015. She is s/p right hemicolectomy with right salpingo oophorectomy and  adjuvant ChemoRT. -she declined adjuvant chemo due to the concern of side effects and impact on her quality of life. -Her 04/2019 CT scan was NED  -With recent rectal cancer diagnosis, her PET from 07/12/20 indicates soft tissue mass within the right iliac fossa is noted and consistent with local tumor recurrence from previous ascending colon tumor. -She declined option of liver biopsy.     3. Mild Anemia -Has been stable and mild for the past year.  -worse lately due to rectal cancer and starting chemo -denies bleeding -monitoring   4. HTN, Anxiety -She'll follow-up with her primary care physician and continue medication -She uses half tab Xanax as needed, mostly only on treatment days. refilled -stable, not worse since her recent rectal cancer diagnosis    5. Hypokalemia -normal on oral K, continue   Disposition: Ms. Fryman appears stable.  She continues FOLFIRI and bevacizumab, tolerating well with mild fatigue, nausea, and fluctuating bowel pattern.  Side effects are well managed with diet and supportive care at home.  She is able to recover, function well, and remain active.  Labs reviewed, ANC 1.5, otherwise CBC and CMP stable, adequate for treatment. She will receive GCSF on day 3 of this cycle. CEA has increased slightly 5.47. No other  clinical change or strong suspicion for disease progression. She is already scheduled for restaging CT on 01/22/2021.  She received COVID booster on 7/8, she is ready to proceed with Evushield when she can.  I will send a message to Wilber Bihari, NP.  Follow-up and next cycle in 2 weeks, then off 3 weeks.  All questions were answered. The patient knows to call the clinic with any problems, questions or concerns. No barriers to learning were detected.     [name], NP 01/10/21

## 2021-01-10 ENCOUNTER — Inpatient Hospital Stay: Payer: Federal, State, Local not specified - PPO

## 2021-01-10 ENCOUNTER — Encounter: Payer: Self-pay | Admitting: Nurse Practitioner

## 2021-01-10 ENCOUNTER — Inpatient Hospital Stay (HOSPITAL_BASED_OUTPATIENT_CLINIC_OR_DEPARTMENT_OTHER): Payer: Federal, State, Local not specified - PPO | Admitting: Nurse Practitioner

## 2021-01-10 ENCOUNTER — Other Ambulatory Visit: Payer: Self-pay

## 2021-01-10 ENCOUNTER — Other Ambulatory Visit: Payer: Self-pay | Admitting: Hematology

## 2021-01-10 VITALS — BP 126/69 | HR 67 | Temp 97.7°F | Resp 18 | Wt 95.6 lb

## 2021-01-10 DIAGNOSIS — C20 Malignant neoplasm of rectum: Secondary | ICD-10-CM

## 2021-01-10 DIAGNOSIS — C182 Malignant neoplasm of ascending colon: Secondary | ICD-10-CM

## 2021-01-10 DIAGNOSIS — C787 Secondary malignant neoplasm of liver and intrahepatic bile duct: Secondary | ICD-10-CM

## 2021-01-10 DIAGNOSIS — Z5112 Encounter for antineoplastic immunotherapy: Secondary | ICD-10-CM | POA: Diagnosis not present

## 2021-01-10 LAB — CBC WITH DIFFERENTIAL (CANCER CENTER ONLY)
Abs Immature Granulocytes: 0.01 10*3/uL (ref 0.00–0.07)
Basophils Absolute: 0 10*3/uL (ref 0.0–0.1)
Basophils Relative: 1 %
Eosinophils Absolute: 0.1 10*3/uL (ref 0.0–0.5)
Eosinophils Relative: 2 %
HCT: 28.8 % — ABNORMAL LOW (ref 36.0–46.0)
Hemoglobin: 8.9 g/dL — ABNORMAL LOW (ref 12.0–15.0)
Immature Granulocytes: 0 %
Lymphocytes Relative: 34 %
Lymphs Abs: 1 10*3/uL (ref 0.7–4.0)
MCH: 26.7 pg (ref 26.0–34.0)
MCHC: 30.9 g/dL (ref 30.0–36.0)
MCV: 86.5 fL (ref 80.0–100.0)
Monocytes Absolute: 0.3 10*3/uL (ref 0.1–1.0)
Monocytes Relative: 12 %
Neutro Abs: 1.5 10*3/uL — ABNORMAL LOW (ref 1.7–7.7)
Neutrophils Relative %: 51 %
Platelet Count: 206 10*3/uL (ref 150–400)
RBC: 3.33 MIL/uL — ABNORMAL LOW (ref 3.87–5.11)
RDW: 15.9 % — ABNORMAL HIGH (ref 11.5–15.5)
WBC Count: 2.9 10*3/uL — ABNORMAL LOW (ref 4.0–10.5)
nRBC: 0 % (ref 0.0–0.2)

## 2021-01-10 LAB — CEA (IN HOUSE-CHCC): CEA (CHCC-In House): 5.47 ng/mL — ABNORMAL HIGH (ref 0.00–5.00)

## 2021-01-10 LAB — CMP (CANCER CENTER ONLY)
ALT: 6 U/L (ref 0–44)
AST: 12 U/L — ABNORMAL LOW (ref 15–41)
Albumin: 3.1 g/dL — ABNORMAL LOW (ref 3.5–5.0)
Alkaline Phosphatase: 62 U/L (ref 38–126)
Anion gap: 7 (ref 5–15)
BUN: 12 mg/dL (ref 8–23)
CO2: 26 mmol/L (ref 22–32)
Calcium: 8.8 mg/dL — ABNORMAL LOW (ref 8.9–10.3)
Chloride: 106 mmol/L (ref 98–111)
Creatinine: 0.7 mg/dL (ref 0.44–1.00)
GFR, Estimated: >60 mL/min (ref >60)
Glucose, Bld: 82 mg/dL (ref 70–99)
Potassium: 3.8 mmol/L (ref 3.5–5.1)
Sodium: 139 mmol/L (ref 135–145)
Total Bilirubin: 0.3 mg/dL (ref 0.3–1.2)
Total Protein: 6.4 g/dL — ABNORMAL LOW (ref 6.5–8.1)

## 2021-01-10 LAB — TOTAL PROTEIN, URINE DIPSTICK: Protein, ur: NEGATIVE mg/dL

## 2021-01-10 MED ORDER — SODIUM CHLORIDE 0.9 % IV SOLN
2170.0000 mg/m2 | INTRAVENOUS | Status: DC
  Administered 2021-01-10: 3000 mg via INTRAVENOUS
  Filled 2021-01-10: qty 60

## 2021-01-10 MED ORDER — SODIUM CHLORIDE 0.9% FLUSH
10.0000 mL | INTRAVENOUS | Status: DC | PRN
  Filled 2021-01-10: qty 10

## 2021-01-10 MED ORDER — ALPRAZOLAM 0.25 MG PO TABS: 0.2500 mg | ORAL_TABLET | Freq: Every day | ORAL | 0 refills | Status: DC | PRN

## 2021-01-10 MED ORDER — SODIUM CHLORIDE 0.9 % IV SOLN
Freq: Once | INTRAVENOUS | Status: AC
  Filled 2021-01-10: qty 250

## 2021-01-10 MED ORDER — SODIUM CHLORIDE 0.9 % IV SOLN
130.0000 mg/m2 | Freq: Once | INTRAVENOUS | Status: AC
  Administered 2021-01-10: 180 mg via INTRAVENOUS
  Filled 2021-01-10: qty 9

## 2021-01-10 MED ORDER — PALONOSETRON HCL INJECTION 0.25 MG/5ML
INTRAVENOUS | Status: AC
  Filled 2021-01-10: qty 5

## 2021-01-10 MED ORDER — ATROPINE SULFATE 1 MG/ML IJ SOLN
0.5000 mg | Freq: Once | INTRAMUSCULAR | Status: AC | PRN
  Administered 2021-01-10: 0.5 mg via INTRAVENOUS

## 2021-01-10 MED ORDER — PALONOSETRON HCL INJECTION 0.25 MG/5ML
0.2500 mg | Freq: Once | INTRAVENOUS | Status: AC
  Administered 2021-01-10: 0.25 mg via INTRAVENOUS

## 2021-01-10 MED ORDER — SODIUM CHLORIDE 0.9 % IV SOLN
10.0000 mg | Freq: Once | INTRAVENOUS | Status: AC
  Administered 2021-01-10: 10 mg via INTRAVENOUS
  Filled 2021-01-10: qty 10

## 2021-01-10 MED ORDER — SODIUM CHLORIDE 0.9 % IV SOLN
400.0000 mg/m2 | Freq: Once | INTRAVENOUS | Status: AC
  Administered 2021-01-10: 556 mg via INTRAVENOUS
  Filled 2021-01-10: qty 27.8

## 2021-01-10 MED ORDER — ATROPINE SULFATE 1 MG/ML IJ SOLN
INTRAMUSCULAR | Status: AC
  Filled 2021-01-10: qty 1

## 2021-01-10 MED ORDER — SODIUM CHLORIDE 0.9 % IV SOLN
200.0000 mg | Freq: Once | INTRAVENOUS | Status: AC
  Administered 2021-01-10: 200 mg via INTRAVENOUS
  Filled 2021-01-10: qty 8

## 2021-01-10 NOTE — Patient Instructions (Signed)

## 2021-01-10 NOTE — Patient Instructions (Signed)
[facility]COL[facility]  Discharge Instructions: Thank you for choosing [facility] to provide your oncology and hematology care.   If you have a lab appointment with the [facility], please go directly to the [facility] and check in at the registration area.   Wear comfortable clothing and clothing appropriate for easy access to any Portacath or PICC line.   We strive to give you quality time with your provider. You may need to reschedule your appointment if you arrive late (15 or more minutes).  Arriving late affects you and other patients whose appointments are after yours.  Also, if you miss three or more appointments without notifying the office, you may be dismissed from the clinic at the provider's discretion.      For prescription refill requests, have your pharmacy contact our office and allow 72 hours for refills to be completed.    Today you received the following chemotherapy and/or immunotherapy agents : Bevacizumab, Irinotecan, Leucovorin, 5FU     To help prevent nausea and vomiting after your treatment, we encourage you to take your nausea medication as directed.  BELOW ARE SYMPTOMS THAT SHOULD BE REPORTED IMMEDIATELY: *FEVER GREATER THAN 100.4 F (38 C) OR HIGHER *CHILLS OR SWEATING *NAUSEA AND VOMITING THAT IS NOT CONTROLLED WITH YOUR NAUSEA MEDICATION *UNUSUAL SHORTNESS OF BREATH *UNUSUAL BRUISING OR BLEEDING *URINARY PROBLEMS (pain or burning when urinating, or frequent urination) *BOWEL PROBLEMS (unusual diarrhea, constipation, pain near the anus) TENDERNESS IN MOUTH AND THROAT WITH OR WITHOUT PRESENCE OF ULCERS (sore throat, sores in mouth, or a toothache) UNUSUAL RASH, SWELLING OR PAIN  UNUSUAL VAGINAL DISCHARGE OR ITCHING   Items with * indicate a potential emergency and should be followed up as soon as possible or go to the Emergency Department if any problems should occur.  Please show the CHEMOTHERAPY ALERT CARD or  IMMUNOTHERAPY ALERT CARD at check-in to the Emergency Department and triage nurse.  Should you have questions after your visit or need to cancel or reschedule your appointment, please contact [facility]  Dept: [phone]  and follow the prompts.  Office hours are 8:00 a.m. to 4:30 p.m. Monday - Friday. Please note that voicemails left after 4:00 p.m. may not be returned until the following business day.  We are closed weekends and major holidays. You have access to a nurse at all times for urgent questions. Please call the main number to the clinic Dept: [phone] and follow the prompts.   For any non-urgent questions, you may also contact your provider using MyChart. We now offer e-Visits for anyone 4 and older to request care online for non-urgent symptoms. For details visit [email or web address].   Also download the MyChart app! Go to the app store, search "MyChart", open the app, select [facility], and log in with your MyChart username and password.  Due to Covid, a mask is required upon entering the hospital/clinic. If you do not have a mask, one will be given to you upon arrival. For doctor visits, patients may have 1 support person aged 36 or older with them. For treatment visits, patients cannot have anyone with them due to current Covid guidelines and our immunocompromised population.

## 2021-01-12 ENCOUNTER — Other Ambulatory Visit: Payer: Self-pay

## 2021-01-12 ENCOUNTER — Inpatient Hospital Stay: Payer: Federal, State, Local not specified - PPO

## 2021-01-12 ENCOUNTER — Telehealth: Payer: Self-pay | Admitting: Hematology

## 2021-01-12 VITALS — BP 106/79 | HR 61 | Temp 97.8°F | Resp 18

## 2021-01-12 DIAGNOSIS — C787 Secondary malignant neoplasm of liver and intrahepatic bile duct: Secondary | ICD-10-CM

## 2021-01-12 DIAGNOSIS — C20 Malignant neoplasm of rectum: Secondary | ICD-10-CM

## 2021-01-12 DIAGNOSIS — Z5112 Encounter for antineoplastic immunotherapy: Secondary | ICD-10-CM | POA: Diagnosis not present

## 2021-01-12 MED ORDER — PEGFILGRASTIM-JMDB 6 MG/0.6ML ~~LOC~~ SOSY
PREFILLED_SYRINGE | SUBCUTANEOUS | Status: AC
  Filled 2021-01-12: qty 0.6

## 2021-01-12 MED ORDER — PEGFILGRASTIM-JMDB 6 MG/0.6ML ~~LOC~~ SOSY
6.0000 mg | PREFILLED_SYRINGE | Freq: Once | SUBCUTANEOUS | Status: AC
  Administered 2021-01-12: 6 mg via SUBCUTANEOUS

## 2021-01-12 MED ORDER — HEPARIN SOD (PORK) LOCK FLUSH 100 UNIT/ML IV SOLN
500.0000 [IU] | Freq: Once | INTRAVENOUS | Status: AC | PRN
  Administered 2021-01-12: 500 [IU]
  Filled 2021-01-12: qty 5

## 2021-01-12 MED ORDER — SODIUM CHLORIDE 0.9% FLUSH
10.0000 mL | INTRAVENOUS | Status: DC | PRN
  Administered 2021-01-12: 10 mL
  Filled 2021-01-12: qty 10

## 2021-01-12 NOTE — Telephone Encounter (Signed)
Scheduled follow-up appointment per 7/13 los. Patient is aware. 

## 2021-01-22 ENCOUNTER — Other Ambulatory Visit: Payer: Federal, State, Local not specified - PPO

## 2021-01-22 ENCOUNTER — Ambulatory Visit (HOSPITAL_COMMUNITY)
Admission: RE | Admit: 2021-01-22 | Discharge: 2021-01-22 | Disposition: A | Discharge status: Home / Self Care | Payer: Federal, State, Local not specified - PPO | Source: Ambulatory Visit | Attending: Hematology | Admitting: Hematology

## 2021-01-22 ENCOUNTER — Ambulatory Visit: Payer: Federal, State, Local not specified - PPO

## 2021-01-22 ENCOUNTER — Other Ambulatory Visit: Payer: Self-pay

## 2021-01-22 ENCOUNTER — Ambulatory Visit: Payer: Federal, State, Local not specified - PPO | Admitting: Nurse Practitioner

## 2021-01-22 ENCOUNTER — Encounter (HOSPITAL_COMMUNITY): Payer: Self-pay

## 2021-01-22 DIAGNOSIS — C182 Malignant neoplasm of ascending colon: Secondary | ICD-10-CM | POA: Diagnosis present

## 2021-01-22 MED ORDER — IOHEXOL 350 MG/ML SOLN
75.0000 mL | Freq: Once | INTRAVENOUS | Status: AC | PRN
  Administered 2021-01-22: 75 mL via INTRAVENOUS

## 2021-01-23 NOTE — Progress Notes (Deleted)
[place]   [phone] Fax:([phone]) [phone]   Clinic Follow up Note   Patient Care Team: Patient, No Pcp Per (Inactive) as PCP - General (General Practice) Brittney Feeling, NP as Nurse Practitioner (Oncology) Brittney Merle, MD as Consulting Physician (Oncology) Brittney Finner, RN (Inactive) as Oncology Nurse Navigator  Date of Service:  01/23/2021  CHIEF COMPLAINT:  f/u of H/o colon cancer and recently diagnosed metastatic rectal cancer  SUMMARY OF ONCOLOGIC HISTORY: Oncology History Overview Note  Cancer Staging Cancer of ascending colon [facility]) Staging form: Colon and Rectum, AJCC 7th Edition - Clinical stage from 02/07/2016: Stage IIIC (T4b, N1b, M0) - Signed by Brittney Merle, MD on 03/04/2016    Cancer of ascending colon ([place])  10/25/2015 Imaging   CT ABD/PELVIS:  Inflammatory changes inferior to the cecal tip appear improved, there is still irregular soft tissue thickening of the cecal tip, and there are adjacent prominent lymph nodes in the ileocolonic mesentery, measuring 13 mm on image 49 and 8 mm on image 52. In addition, there is a 2.5 x 1.8 cm nodule on image 46 which has central low density. Therefore, these findings are moderately suspicious for an underlying cecal malignancy with perforation.     01/19/2016 Procedure   COLONOSCOPY per Dr. Loletha Spencer: Fungating, ulcerated mass almost obstructing mid ascending colon    01/19/2016 Initial Biopsy   Diagnosis Surgical [P], cecal mass - INVASIVE ADENOCARCINOMA WITH ULCERATION. - SEE COMMENT.    02/05/2016 Tumor Marker   Patient's tumor was tested for the following markers: CEA Results of the tumor marker test revealed 5.7.    02/07/2016 Initial Diagnosis   Cancer of ascending colon ([name])    02/07/2016 Definitive Surgery   Laparoscopic assisted right hemicolectomy and right salpingo oopherectomy--Dr. [name]    02/07/2016 Pathologic Stage   p T4 N1b   2/43 nodes +    02/07/2016 Pathology Results    MMR normal; G2 adenocarcinoma;proximal & distal margins negative; soft tissue mass on pelvic sidewall + for adenocarcinoma with positive margin MSI Stable    03/08/2016 Imaging   CT chest negative for metastasis.     03/19/2016 - 04/25/2016 Radiation Therapy   Adjuvant irradiation, 50 gray in 28 fractions    03/19/2016 - 04/22/2016 Chemotherapy   Xeloda 1500 mg twice daily, started on 03/19/2016, dose reduced to 1000 mg twice daily from week 3 due to neutropenia, and patient stopped 3 days before last dose radiation due to difficulty swallowing the pill    05/20/2016 -  Adjuvant Chemotherapy   Patient declined adjuvant chemotherapy    09/16/2016 Imaging   CT CAP w Contrast 1. No evidence of local tumor recurrence at the ileocolic anastomosis. 2. No findings suspicious for metastatic disease in the chest, abdomen or pelvis. 3. Nonspecific trace free fluid in the pelvic cul-de-sac. 4. Stable solitary 3 mm right upper lobe pulmonary nodule, for which 6 month stability has been demonstrated, probably benign. 5. Additional findings include stable right posterior pericardial cyst and small calcified uterine fibroids.    05/13/2017 Imaging   CT CAP W Contrast 05/13/17 IMPRESSION: 1. No current findings of residual or recurrent malignancy. 2. Mild prominence of stool throughout the colon. Nondistended portions of the rectum. 3. Several tiny pulmonary nodules are stable from the earliest available comparison of 03/08/2016 and probably benign, but may merit surveillance. 4. Other imaging findings of potential clinical significance: Old granulomatous disease. Aortoiliac atherosclerotic vascular disease. Lumbar spondylosis and degenerative disc disease. Stable amount of  trace free pelvic fluid.   04/27/2018 Imaging   04/27/2018 CT CAP IMPRESSION: Stable exam. No evidence of recurrent or metastatic carcinoma within the chest, abdomen, or pelvis   04/19/2019 Imaging   CT CAP W  Contrast  IMPRESSION: Chest Impression:   1. No evidence of thoracic metastasis. 2. Stable small bilateral pulmonary nodules.   Abdomen / Pelvis Impression:   1. No evidence local colorectal carcinoma recurrence or metastasis in the abdomen pelvis. 2. Post RIGHT hemicolectomy.   Rectal cancer metastasized to liver ([name])  06/15/2020 Procedure   Screening Colonoscopy by Dr Brittney Spencer  IMPRESSION - Decreased sphincter tone and internal hemorrhoids that prolapse with straining, but require manual replacement into the anal canal (Grade III) found on digital rectal exam. - Patent side-to-side ileo-colonic anastomosis, characterized by healthy appearing mucosa. - The examined portion of the ileum was normal. - One diminutive polyp in the proximal transverse colon, removed with a cold biopsy forceps. Resected and retrieved. - Likely malignant partially obstructing tumor in the mid rectum. Biopsied. Tattooed. - The examination was otherwise normal on direct and retroflexion views.   06/15/2020 Initial Biopsy   Diagnosis 1. Transverse Colon Polyp - HYPERPLASTIC POLYP 2. Rectum, biopsy - ADENOCARCINOMA ARISING IN A TUBULAR ADENOMA WITH HIGH-GRADE DYSPLASIA. SEE NOTE Diagnosis Note 2. Dr. [name] reviewed the case and concurs with the diagnosis. Dr. Loletha Spencer was notified on 06/16/2020.   06/28/2020 Imaging   CT CAP  IMPRESSION: 1. New low-density focus in the anterior aspect of the lateral segment LEFT hepatic lobe measuring 1.2 x 1.0 cm, compatible with small metastatic lesion in the LEFT hepatic lobe. 2. Soft tissue in the RIGHT iliac fossa following RIGHT hemicolectomy invades the psoas musculature and is slowly enlarging over time, more linear on the prior study now highly concerning for recurrence/metastasis to this location. 3. Signs of enteritis, potentially post radiation changes of the small bowel. Tethered small bowel in the RIGHT lower quadrant shows focal thickening and  narrowing suspicious for small bowel involvement and developing partial obstruction though currently contrast passes beyond this point into the colon. 4. Rectal thickening in this patient with known rectal mass as described. 5. No evidence of metastatic disease in the chest. 6. Stable small pulmonary nodules. 7.  and aortic atherosclerosis.   Aortic Atherosclerosis (ICD10-I70.0) and Emphysema (ICD10-J43.9).   07/04/2020 Initial Diagnosis   Rectal cancer metastasized to liver ([place])   07/12/2020 PET scan   IMPRESSION: 1. Exam positive for FDG avid rectal tumor which corresponds to the recent colonoscopy findings. 2. FDG avid soft tissue mass within the right iliac fossa is noted and consistent with local tumor recurrence from previous ascending colon tumor. 3. Lateral segment left lobe of liver lesion is FDG avid concerning for liver metastasis. 4. No specific findings identified to suggest metastatic disease to the chest.   08/10/2020 -  Chemotherapy   First-line FOLFIRI q2weeks starting 08/10/20. dose reduced with cycle 1. Irinotecan/5FU increased and Bevacizumab added with cycle 2 on 08/23/2020    10/27/2020 Imaging   CT CAP  IMPRESSION: 1. Interval decrease in size of the hypermetabolic left hepatic lesion, consistent with metastatic disease. No new liver lesion evident. 2. Interval resolution of the hypermetabolic soft tissue lesion along the right iliac fossa with no measurable soft tissue lesion remaining at this location today. 3. Similar appearance of soft tissue fullness in the rectum at the site of the hypermetabolic lesion seen previously. 4. Stable tiny bilateral pulmonary nodules. Continued attention on follow-up recommended. 5. Small  volume free fluid in the pelvis. 6. Aortic Atherosclerosis (ICD10-I70.0).      CURRENT THERAPY:  First-line FOLFIRI q2weeks starting 08/10/20. dose reduced with cycle 1. Bevacizumab added with cycle 2 on 08/23/2020  INTERVAL  HISTORY: SAMAA UEDA is here for a follow up. She was last seen by me 11/29/20. She presents to the clinic alone. She is here for cycle 11. She notes no issues tolerating the chemotherapy. The patient notes mild aching last week around the time of her GF  shot, but did not take the Claritin. The patient notes some intermittent bother in her leg, but was very active recently and climbing stairs/ walking on incline. She denies any back pain or other bodily pain.   REVIEW OF SYSTEMS:  Constitutional: Denies fevers, chills or abnormal weight loss Eyes: Denies blurriness of vision Ears, nose, mouth, throat, and face: Denies mucositis or sore throat Respiratory: Denies cough, dyspnea or wheezes Cardiovascular: Denies palpitation, chest discomfort or lower extremity swelling Gastrointestinal:  Denies nausea, heartburn or change in bowel habits Skin: Denies abnormal skin rashes Lymphatics: Denies new lymphadenopathy or easy bruising Neurological:Denies numbness, tingling or new weaknesses Behavioral/Psych: Mood is stable, no new changes  All other systems were reviewed with the patient and are negative.  MEDICAL HISTORY:  Past Medical History:  Diagnosis Date   AAA (abdominal aortic aneurysm) (HCC)    infrarenal 4.1 cmper s-9-19 scan on chart   Anemia    hx of   Anxiety    has PRN meds   Colon cancer ([place]) 2017   RIGHT hemi colectomy-s/p sx   GERD (gastroesophageal reflux disease)    OTC meds/diet control   Hypertension    on meds   Macular pucker, right eye 10/06/2019   Retinal detachment, right 09/2019   Vitamin D deficiency     SURGICAL HISTORY: Past Surgical History:  Procedure Laterality Date   COLONOSCOPY  2018   [facility]   COLONSCOPY  12/2015   IR IMAGING GUIDED PORT INSERTION  08/04/2020   LAPAROSCOPIC RIGHT HEMI COLECTOMY Right 02/07/2016   Procedure: LAPAROSCOPIC ASSISTED RIGHT HEMI COLECTOMY AND RIGHT SALPINGO OOPHERECTOMY;  Surgeon: [name], MD;  Location: [facility]  [facility];  Service: General;  Laterality: Right;   RETINAL DETACHMENT SURGERY  09/2019    I have reviewed the social history and family history with the patient and they are unchanged from previous note.  ALLERGIES:  is allergic to fish allergy, peanut-containing drug products, soy allergy, and buspirone.  MEDICATIONS:  Current Outpatient Medications  Medication Sig Dispense Refill   ALPRAZolam (XANAX) 0.25 MG tablet Take 1 tablet (0.25 mg total) by mouth daily as needed for anxiety. 30 tablet 0   Cholecalciferol (VITAMIN D3 PO) Take by mouth daily.     diphenoxylate-atropine (LOMOTIL) 2.5-0.025 MG tablet Take 2 tablets by mouth 4 (four) times daily as needed for diarrhea or loose stools. 45 tablet 3   docusate sodium (COLACE) 100 MG capsule 1 capsule as needed     lidocaine-prilocaine (EMLA) cream Apply 1 application topically as needed. 30 g 1   NORVASC 2.5 MG tablet TAKE 1 TABLET BY MOUTH EVERY DAY 90 tablet 1   ondansetron (ZOFRAN) 8 MG tablet Take 1 tablet (8 mg total) by mouth every 8 (eight) hours as needed for nausea or vomiting. 20 tablet 2   Current Facility-Administered Medications  Medication Dose Route Frequency Provider Last Rate Last Admin   0.9 %  sodium chloride infusion  500 mL Intravenous Once Nelida Meuse  III, MD        PHYSICAL EXAMINATION: ECOG PERFORMANCE STATUS: 1 - Symptomatic but completely ambulatory  There were no vitals filed for this visit.  There were no vitals filed for this visit.   GENERAL:alert, no distress and comfortable SKIN: skin color, texture, turgor are normal, no rashes or significant lesions EYES: normal, Conjunctiva are pink and non-injected, sclera clear  LYMPH:  no palpable lymphadenopathy in the cervical, axillary, or inguinal regions. ABDOMEN:abdomen soft, non-tender and normal bowel sounds NEURO: alert & oriented x 3 with fluent speech, no focal motor/sensory deficits  LABORATORY DATA:  I have reviewed the data as listed CBC  Latest Ref Rng & Units 01/10/2021 12/27/2020 12/13/2020  WBC 4.0 - 10.5 K/uL 2.9(L) 12.3(H) 9.3  Hemoglobin 12.0 - 15.0 g/dL 8.9(L) 8.8(L) 9.2(L)  Hematocrit 36.0 - 46.0 % 28.8(L) 28.3(L) 29.9(L)  Platelets 150 - 400 K/uL 206 177 189     CMP Latest Ref Rng & Units 01/10/2021 12/27/2020 12/13/2020  Glucose 70 - 99 mg/dL 82 76 103(H)  BUN 8 - 23 mg/dL _0 Creatinine 0.44 - 1.00 mg/dL 0.70 0.73 0.72  Sodium 135 - 145 mmol/L 139 140 139  Potassium 3.5 - 5.1 mmol/L 3.8 3.5 3.9  Chloride 98 - 111 mmol/L 106 107 108  CO2 22 - 32 mmol/L _1 Calcium 8.9 - 10.3 mg/dL 8.8(L) 9.0 8.6(L)  Total Protein 6.5 - 8.1 g/dL 6.4(L) 6.7 6.6  Total Bilirubin 0.3 - 1.2 mg/dL 0.3 0.2(L) 0.2(L)  Alkaline Phos 38 - 126 U/L 62 90 90  AST 15 - 41 U/L 12(L) 13(L) 12(L)  ALT 0 - 44 U/L _2 RADIOGRAPHIC STUDIES: I have personally reviewed the radiological images as listed and agreed with the findings in the report. CT CHEST ABDOMEN PELVIS W CONTRAST  Result Date: 01/22/2021 CLINICAL DATA:  Colorectal cancer. Evaluate treatment response to chemotherapy. Radiation therapy completed in 2017. 20 pound weight loss over 5-6 months. Diarrhea/constipation. EXAM: CT CHEST, ABDOMEN, AND PELVIS WITH CONTRAST TECHNIQUE: Multidetector CT imaging of the chest, abdomen and pelvis was performed following the standard protocol during bolus administration of intravenous contrast. CONTRAST:  71m OMNIPAQUE IOHEXOL 350 MG/ML SOLN COMPARISON:  10/27/2020 FINDINGS: CT CHEST FINDINGS Cardiovascular: Right Port-A-Cath tip high right atrium. Aortic atherosclerosis. Normal heart size, without pericardial effusion. No central pulmonary embolism, on this non-dedicated study. Mediastinum/Nodes: No mediastinal or definite hilar adenopathy, given limitations of unenhanced CT. Lungs/Pleura: No pleural fluid. Scattered tiny bilateral pulmonary nodules are identified on series 6. Examples within the left upper lobe at 3 mm on 62/6 and  right lower lobe at 2-3 mm on 82/6, similar. Anterior left upper lobe reticulonodular opacity on 59/6 is unchanged and likely post infectious or inflammatory. New reticulonodular opacity in the posterior left upper lobe including on 40 and 41 of series 6. Musculoskeletal: No acute osseous abnormality. CT ABDOMEN PELVIS FINDINGS Hepatobiliary: Segment 2 hypoattenuating liver lesion measures 3 mm on 52/2 versus 6 mm on the prior. Scattered 1-2 mm well-circumscribed hypoattenuating liver lesions are likely cysts and are unchanged. No suspicious liver lesion. Normal gallbladder, without biliary ductal dilatation. Pancreas: Normal, without mass or ductal dilatation. Spleen: Old granulomatous disease within. Adrenals/Urinary Tract: Normal adrenal glands. Mild renal cortical thinning bilaterally. Lower pole right renal 2.2 cm cyst. Bilateral too small to characterize renal lesions. Normal urinary bladder. Stomach/Bowel: Proximal gastric underdistention. Mid rectal wall thickening including on 103/2 is relatively similar. No obstruction, with  large colonic stool burden throughout. Normal small bowel. Vascular/Lymphatic: Aortic atherosclerosis. No abdominopelvic adenopathy. Reproductive: Retroverted uterus.  No adnexal mass. Other: Trace left pelvic fluid is similar. Although there is a paucity of abdominopelvic fat, no well-defined omental or peritoneal disease seen. Musculoskeletal: No acute osseous abnormality. IMPRESSION: CT CHEST IMPRESSION 1. Similar nonspecific pulmonary nodules. 2. New posterior left upper lobe reticulonodular opacity, suspicious for interval mild infection or inflammation. 3. No thoracic adenopathy. CT ABDOMEN AND PELVIS IMPRESSION 1. Further decrease in size of high left hepatic lobe 3 mm low-density lesion. No new or progressive metastatic disease within the abdomen or pelvis. 2. Similar trace free pelvic fluid. 3. Similar nonspecific mid rectal wall thickening. 4.  Aortic Atherosclerosis  (ICD10-I70.0). Electronically Signed   By: Abigail Miyamoto M.D.   On: 01/22/2021 13:56     ASSESSMENT & PLAN:  Brittney Spencer is a 70 y.o. female with   1. Rectal adenocarcinoma, with liver and right pelvic metastasis, recurrence from previous colon cancer vs new primary   -Diagnosed on routine screening colonoscopy 05/2020, rectal mass biopsy showed invasive adenocarcinoma, arising from a tubular adenoma -Her 07/12/20 PET showed positive uptake of known rectal tumor and soft tissue mass within the right iliac fossa indicating recurrent prior colon cancer. There is also left lobe of liver lesion is FDG avid concerning for liver metastasis. pt declined liver biopsy -Whether this is metastatic rectal or recurrent/metastatic colon cancer, this is likely not curable, but still treatable. She previously declined referral for HIPEC -She began first-line systemic chemo with dose-reduced FOLFIRI on 08/06/20. Bevacizumab added, and irinotecan/5FU increased with cycle 2, but due to poor tolerance/diarrhea irinotecan was dose reduced with cycle 3 and she has been tolerating well. -Her CEA and 10/27/20 CT CAP has shown good response.   -The patient notes that she will be out of town on August 1-6.   2. Cancer of ascending colon, pT4bN1bM0, stage IIIC, MSI-stable, (+) surgical margins at pelvic wall     -She was diagnosed in 12/2015. She is s/p right hemicolectomy with right salpingo oophorectomy and  adjuvant ChemoRT. -she declined adjuvant chemo due to the concern of side effects and impact on her quality of life. -Her 04/2019 CT scan was NED  -With recent rectal cancer diagnosis, her PET from 07/12/20 indicates soft tissue mass within the right iliac fossa is noted and consistent with local tumor recurrence from previous ascending colon tumor. -She previously declined option of liver biopsy.     3. Mild Anemia -Has been stable and mild for the past year.  -worse lately due to her newly diagnosed rectal  cancer and starting chemo. Denies overt GI bleeding. -Moderate and stable lately.    4. HTN, Anxiety -She'll follow-up with her primary care physician and continue medication -She uses Xanax as needed. She is stable.  -She continues to cope with her diagnosis and treatment.    5. Hypokalemia -She notes she is not taking K supplement. She instead increased potassium in diet.  -K normal today      Plan:   No problem-specific Assessment & Plan notes found for this encounter.   No orders of the defined types were placed in this encounter.   All questions were answered. The patient knows to call the clinic with any problems, questions or concerns. No barriers to learning was detected. The total time spent in the appointment was 30 minutes.     Brittney Merle, MD 01/23/2021

## 2021-01-24 ENCOUNTER — Inpatient Hospital Stay (HOSPITAL_BASED_OUTPATIENT_CLINIC_OR_DEPARTMENT_OTHER): Payer: Federal, State, Local not specified - PPO | Admitting: Hematology

## 2021-01-24 ENCOUNTER — Ambulatory Visit: Payer: Federal, State, Local not specified - PPO

## 2021-01-24 ENCOUNTER — Inpatient Hospital Stay: Payer: Federal, State, Local not specified - PPO

## 2021-01-24 ENCOUNTER — Other Ambulatory Visit: Payer: Federal, State, Local not specified - PPO

## 2021-01-24 ENCOUNTER — Encounter: Payer: Self-pay | Admitting: Hematology

## 2021-01-24 ENCOUNTER — Ambulatory Visit: Payer: Federal, State, Local not specified - PPO | Admitting: Hematology

## 2021-01-24 ENCOUNTER — Other Ambulatory Visit: Payer: Self-pay

## 2021-01-24 VITALS — BP 121/79 | HR 73 | Temp 98.1°F | Resp 20 | Ht 66.0 in | Wt 96.7 lb

## 2021-01-24 DIAGNOSIS — C787 Secondary malignant neoplasm of liver and intrahepatic bile duct: Secondary | ICD-10-CM

## 2021-01-24 DIAGNOSIS — Z5112 Encounter for antineoplastic immunotherapy: Secondary | ICD-10-CM | POA: Diagnosis not present

## 2021-01-24 DIAGNOSIS — C182 Malignant neoplasm of ascending colon: Secondary | ICD-10-CM

## 2021-01-24 DIAGNOSIS — Z95828 Presence of other vascular implants and grafts: Secondary | ICD-10-CM

## 2021-01-24 DIAGNOSIS — C20 Malignant neoplasm of rectum: Secondary | ICD-10-CM

## 2021-01-24 LAB — CBC WITH DIFFERENTIAL (CANCER CENTER ONLY)
Abs Immature Granulocytes: 0.08 10*3/uL — ABNORMAL HIGH (ref 0.00–0.07)
Basophils Absolute: 0 10*3/uL (ref 0.0–0.1)
Basophils Relative: 0 %
Eosinophils Absolute: 0.1 10*3/uL (ref 0.0–0.5)
Eosinophils Relative: 1 %
HCT: 28.4 % — ABNORMAL LOW (ref 36.0–46.0)
Hemoglobin: 8.8 g/dL — ABNORMAL LOW (ref 12.0–15.0)
Immature Granulocytes: 1 %
Lymphocytes Relative: 12 %
Lymphs Abs: 1.1 10*3/uL (ref 0.7–4.0)
MCH: 27 pg (ref 26.0–34.0)
MCHC: 31 g/dL (ref 30.0–36.0)
MCV: 87.1 fL (ref 80.0–100.0)
Monocytes Absolute: 0.7 10*3/uL (ref 0.1–1.0)
Monocytes Relative: 7 %
Neutro Abs: 7.2 10*3/uL (ref 1.7–7.7)
Neutrophils Relative %: 79 %
Platelet Count: 144 10*3/uL — ABNORMAL LOW (ref 150–400)
RBC: 3.26 MIL/uL — ABNORMAL LOW (ref 3.87–5.11)
RDW: 16.4 % — ABNORMAL HIGH (ref 11.5–15.5)
WBC Count: 9.2 10*3/uL (ref 4.0–10.5)
nRBC: 0 % (ref 0.0–0.2)

## 2021-01-24 LAB — CMP (CANCER CENTER ONLY)
ALT: <6 U/L (ref 0–44)
AST: 13 U/L — ABNORMAL LOW (ref 15–41)
Albumin: 3.3 g/dL — ABNORMAL LOW (ref 3.5–5.0)
Alkaline Phosphatase: 76 U/L (ref 38–126)
Anion gap: 7 (ref 5–15)
BUN: 12 mg/dL (ref 8–23)
CO2: 26 mmol/L (ref 22–32)
Calcium: 8.6 mg/dL — ABNORMAL LOW (ref 8.9–10.3)
Chloride: 106 mmol/L (ref 98–111)
Creatinine: 0.76 mg/dL (ref 0.44–1.00)
GFR, Estimated: >60 mL/min (ref >60)
Glucose, Bld: 85 mg/dL (ref 70–99)
Potassium: 3.7 mmol/L (ref 3.5–5.1)
Sodium: 139 mmol/L (ref 135–145)
Total Bilirubin: 0.2 mg/dL — ABNORMAL LOW (ref 0.3–1.2)
Total Protein: 6.4 g/dL — ABNORMAL LOW (ref 6.5–8.1)

## 2021-01-24 LAB — CEA (IN HOUSE-CHCC): CEA (CHCC-In House): 5.7 ng/mL — ABNORMAL HIGH (ref 0.00–5.00)

## 2021-01-24 LAB — TOTAL PROTEIN, URINE DIPSTICK: Protein, ur: NEGATIVE mg/dL

## 2021-01-24 MED ORDER — ATROPINE SULFATE 1 MG/ML IJ SOLN
0.5000 mg | Freq: Once | INTRAMUSCULAR | Status: AC | PRN
  Administered 2021-01-24: 0.5 mg via INTRAVENOUS

## 2021-01-24 MED ORDER — HEPARIN SOD (PORK) LOCK FLUSH 100 UNIT/ML IV SOLN
500.0000 [IU] | Freq: Once | INTRAVENOUS | Status: AC | PRN
  Administered 2021-01-24: 500 [IU]
  Filled 2021-01-24: qty 5

## 2021-01-24 MED ORDER — PALONOSETRON HCL INJECTION 0.25 MG/5ML
INTRAVENOUS | Status: AC
  Filled 2021-01-24: qty 5

## 2021-01-24 MED ORDER — PALONOSETRON HCL INJECTION 0.25 MG/5ML
0.2500 mg | Freq: Once | INTRAVENOUS | Status: AC
  Administered 2021-01-24: 0.25 mg via INTRAVENOUS

## 2021-01-24 MED ORDER — SODIUM CHLORIDE 0.9 % IV SOLN
Freq: Once | INTRAVENOUS | Status: AC
  Filled 2021-01-24: qty 250

## 2021-01-24 MED ORDER — SODIUM CHLORIDE 0.9 % IV SOLN
10.0000 mg | Freq: Once | INTRAVENOUS | Status: AC
  Administered 2021-01-24: 10 mg via INTRAVENOUS
  Filled 2021-01-24: qty 10

## 2021-01-24 MED ORDER — SODIUM CHLORIDE 0.9 % IV SOLN
100.0000 mg/m2 | Freq: Once | INTRAVENOUS | Status: AC
  Administered 2021-01-24: 140 mg via INTRAVENOUS
  Filled 2021-01-24: qty 7

## 2021-01-24 MED ORDER — ATROPINE SULFATE 1 MG/ML IJ SOLN
INTRAMUSCULAR | Status: AC
  Filled 2021-01-24: qty 1

## 2021-01-24 MED ORDER — SODIUM CHLORIDE 0.9 % IV SOLN
200.0000 mg | Freq: Once | INTRAVENOUS | Status: AC
  Administered 2021-01-24: 200 mg via INTRAVENOUS
  Filled 2021-01-24: qty 8

## 2021-01-24 MED ORDER — SODIUM CHLORIDE 0.9% FLUSH
10.0000 mL | Freq: Once | INTRAVENOUS | Status: AC
  Administered 2021-01-24: 10 mL
  Filled 2021-01-24: qty 10

## 2021-01-24 MED ORDER — SODIUM CHLORIDE 0.9% FLUSH
10.0000 mL | INTRAVENOUS | Status: DC | PRN
  Administered 2021-01-24: 10 mL
  Filled 2021-01-24: qty 10

## 2021-01-24 NOTE — Patient Instructions (Signed)
[facility]  Discharge Instructions: Thank you for choosing [facility] to provide your oncology and hematology care.   If you have a lab appointment with the [facility], please go directly to the [facility] and check in at the registration area.   Wear comfortable clothing and clothing appropriate for easy access to any Portacath or PICC line.   We strive to give you quality time with your provider. You may need to reschedule your appointment if you arrive late (15 or more minutes).  Arriving late affects you and other patients whose appointments are after yours.  Also, if you miss three or more appointments without notifying the office, you may be dismissed from the clinic at the provider's discretion.      For prescription refill requests, have your pharmacy contact our office and allow 72 hours for refills to be completed.    Today you received the following chemotherapy and/or immunotherapy agents avastin, irinotecan      To help prevent nausea and vomiting after your treatment, we encourage you to take your nausea medication as directed.  BELOW ARE SYMPTOMS THAT SHOULD BE REPORTED IMMEDIATELY: *FEVER GREATER THAN 100.4 F (38 C) OR HIGHER *CHILLS OR SWEATING *NAUSEA AND VOMITING THAT IS NOT CONTROLLED WITH YOUR NAUSEA MEDICATION *UNUSUAL SHORTNESS OF BREATH *UNUSUAL BRUISING OR BLEEDING *URINARY PROBLEMS (pain or burning when urinating, or frequent urination) *BOWEL PROBLEMS (unusual diarrhea, constipation, pain near the anus) TENDERNESS IN MOUTH AND THROAT WITH OR WITHOUT PRESENCE OF ULCERS (sore throat, sores in mouth, or a toothache) UNUSUAL RASH, SWELLING OR PAIN  UNUSUAL VAGINAL DISCHARGE OR ITCHING   Items with * indicate a potential emergency and should be followed up as soon as possible or go to the Emergency Department if any problems should occur.  Please show the CHEMOTHERAPY ALERT CARD or IMMUNOTHERAPY ALERT CARD at  check-in to the Emergency Department and triage nurse.  Should you have questions after your visit or need to cancel or reschedule your appointment, please contact [facility]  Dept: [phone]  and follow the prompts.  Office hours are 8:00 a.m. to 4:30 p.m. Monday - Friday. Please note that voicemails left after 4:00 p.m. may not be returned until the following business day.  We are closed weekends and major holidays. You have access to a nurse at all times for urgent questions. Please call the main number to the clinic Dept: [phone] and follow the prompts.   For any non-urgent questions, you may also contact your provider using MyChart. We now offer e-Visits for anyone 40 and older to request care online for non-urgent symptoms. For details visit [email or web address].   Also download the MyChart app! Go to the app store, search "MyChart", open the app, select [facility], and log in with your MyChart username and password.  Due to Covid, a mask is required upon entering the hospital/clinic. If you do not have a mask, one will be given to you upon arrival. For doctor visits, patients may have 1 support person aged 69 or older with them. For treatment visits, patients cannot have anyone with them due to current Covid guidelines and our immunocompromised population.

## 2021-01-24 NOTE — Progress Notes (Addendum)
[name]   Telephone:([phone]) [phone] Fax:(336) [phone]   Clinic Follow up Note   Patient Care Team: Patient, No Pcp Per (Inactive) as PCP - General (General Practice) [name], NP as Nurse Practitioner (Oncology) [name], MD as Consulting Physician (Oncology) [name], RN (Inactive) as Oncology Nurse Navigator  Date of Service:  01/24/2021  CHIEF COMPLAINT:  f/u of H/o colon cancer and recently diagnosed metastatic rectal cancer  SUMMARY OF ONCOLOGIC HISTORY: Oncology History Overview Note  Cancer Staging Cancer of ascending colon [facility]) Staging form: Colon and Rectum, AJCC 7th Edition - Clinical stage from 02/07/2016: Stage IIIC (T4b, N1b, M0) - Signed by [name], MD on 03/04/2016 Laterality: Right Residual tumor (R): R2 - Macroscopic    Cancer of ascending colon ([place])  10/25/2015 Imaging   CT ABD/PELVIS:  Inflammatory changes inferior to the cecal tip appear improved, there is still irregular soft tissue thickening of the cecal tip, and there are adjacent prominent lymph nodes in the ileocolonic mesentery, measuring 13 mm on image 49 and 8 mm on image 52. In addition, there is a 2.5 x 1.8 cm nodule on image 46 which has central low density. Therefore, these findings are moderately suspicious for an underlying cecal malignancy with perforation.     01/19/2016 Procedure   COLONOSCOPY per Dr. [name]: Fungating, ulcerated mass almost obstructing mid ascending colon    01/19/2016 Initial Biopsy   Diagnosis Surgical [P], cecal mass - INVASIVE ADENOCARCINOMA WITH ULCERATION. - SEE COMMENT.    02/05/2016 Tumor Marker   Patient's tumor was tested for the following markers: CEA Results of the tumor marker test revealed 5.7.    02/07/2016 Initial Diagnosis   Cancer of ascending colon ([place])    02/07/2016 Definitive Surgery   Laparoscopic assisted right hemicolectomy and right salpingo oopherectomy--Dr. [name]    02/07/2016 Pathologic Stage    p T4 N1b   2/43 nodes +    02/07/2016 Pathology Results   MMR normal; G2 adenocarcinoma;proximal & distal margins negative; soft tissue mass on pelvic sidewall + for adenocarcinoma with positive margin MSI Stable    03/08/2016 Imaging   CT chest negative for metastasis.     03/19/2016 - 04/25/2016 Radiation Therapy   Adjuvant irradiation, 50 gray in 28 fractions    03/19/2016 - 04/22/2016 Chemotherapy   Xeloda 1500 mg twice daily, started on 03/19/2016, dose reduced to 1000 mg twice daily from week 3 due to neutropenia, and patient stopped 3 days before last dose radiation due to difficulty swallowing the pill    05/20/2016 -  Adjuvant Chemotherapy   Patient declined adjuvant chemotherapy    09/16/2016 Imaging   CT CAP w Contrast 1. No evidence of local tumor recurrence at the ileocolic anastomosis. 2. No findings suspicious for metastatic disease in the chest, abdomen or pelvis. 3. Nonspecific trace free fluid in the pelvic cul-de-sac. 4. Stable solitary 3 mm right upper lobe pulmonary nodule, for which 6 month stability has been demonstrated, probably benign. 5. Additional findings include stable right posterior pericardial cyst and small calcified uterine fibroids.    05/13/2017 Imaging   CT CAP W Contrast 05/13/17 IMPRESSION: 1. No current findings of residual or recurrent malignancy. 2. Mild prominence of stool throughout the colon. Nondistended portions of the rectum. 3. Several tiny pulmonary nodules are stable from the earliest available comparison of 03/08/2016 and probably benign, but may merit surveillance. 4. Other imaging findings of potential clinical significance: Old granulomatous disease. Aortoiliac atherosclerotic vascular disease. Lumbar  spondylosis and degenerative disc disease. Stable amount of trace free pelvic fluid.   04/27/2018 Imaging   04/27/2018 CT CAP IMPRESSION: Stable exam. No evidence of recurrent or metastatic carcinoma within the  chest, abdomen, or pelvis   04/19/2019 Imaging   CT CAP W Contrast  IMPRESSION: Chest Impression:   1. No evidence of thoracic metastasis. 2. Stable small bilateral pulmonary nodules.   Abdomen / Pelvis Impression:   1. No evidence local colorectal carcinoma recurrence or metastasis in the abdomen pelvis. 2. Post RIGHT hemicolectomy.   01/22/2021 Imaging   CT CAP  IMPRESSION: CT CHEST IMPRESSION   1. Similar nonspecific pulmonary nodules. 2. New posterior left upper lobe reticulonodular opacity, suspicious for interval mild infection or inflammation. 3. No thoracic adenopathy.   CT ABDOMEN AND PELVIS IMPRESSION   1. Further decrease in size of high left hepatic lobe 3 mm low-density lesion. No new or progressive metastatic disease within the abdomen or pelvis. 2. Similar trace free pelvic fluid. 3. Similar nonspecific mid rectal wall thickening. 4.  Aortic Atherosclerosis (ICD10-I70.0).   Rectal cancer metastasized to liver ([place])  06/15/2020 Procedure   Screening Colonoscopy by Dr [name]  IMPRESSION - Decreased sphincter tone and internal hemorrhoids that prolapse with straining, but require manual replacement into the anal canal (Grade III) found on digital rectal exam. - Patent side-to-side ileo-colonic anastomosis, characterized by healthy appearing mucosa. - The examined portion of the ileum was normal. - One diminutive polyp in the proximal transverse colon, removed with a cold biopsy forceps. Resected and retrieved. - Likely malignant partially obstructing tumor in the mid rectum. Biopsied. Tattooed. - The examination was otherwise normal on direct and retroflexion views.   06/15/2020 Initial Biopsy   Diagnosis 1. Transverse Colon Polyp - HYPERPLASTIC POLYP 2. Rectum, biopsy - ADENOCARCINOMA ARISING IN A TUBULAR ADENOMA WITH HIGH-GRADE DYSPLASIA. SEE NOTE Diagnosis Note 2. Dr. [name] reviewed the case and concurs with the diagnosis. Dr. [name] was notified  on 06/16/2020.   06/28/2020 Imaging   CT CAP  IMPRESSION: 1. New low-density focus in the anterior aspect of the lateral segment LEFT hepatic lobe measuring 1.2 x 1.0 cm, compatible with small metastatic lesion in the LEFT hepatic lobe. 2. Soft tissue in the RIGHT iliac fossa following RIGHT hemicolectomy invades the psoas musculature and is slowly enlarging over time, more linear on the prior study now highly concerning for recurrence/metastasis to this location. 3. Signs of enteritis, potentially post radiation changes of the small bowel. Tethered small bowel in the RIGHT lower quadrant shows focal thickening and narrowing suspicious for small bowel involvement and developing partial obstruction though currently contrast passes beyond this point into the colon. 4. Rectal thickening in this patient with known rectal mass as described. 5. No evidence of metastatic disease in the chest. 6. Stable small pulmonary nodules. 7.  and aortic atherosclerosis.   Aortic Atherosclerosis (ICD10-I70.0) and Emphysema (ICD10-J43.9).   07/04/2020 Initial Diagnosis   Rectal cancer metastasized to liver ([place])   07/12/2020 PET scan   IMPRESSION: 1. Exam positive for FDG avid rectal tumor which corresponds to the recent colonoscopy findings. 2. FDG avid soft tissue mass within the right iliac fossa is noted and consistent with local tumor recurrence from previous ascending colon tumor. 3. Lateral segment left lobe of liver lesion is FDG avid concerning for liver metastasis. 4. No specific findings identified to suggest metastatic disease to the chest.   08/10/2020 -  Chemotherapy   First-line FOLFIRI q2weeks starting 08/10/20. dose reduced with cycle  1. Irinotecan/5FU increased and Bevacizumab added with cycle 2 on 08/23/2020    10/27/2020 Imaging   CT CAP  IMPRESSION: 1. Interval decrease in size of the hypermetabolic left hepatic lesion, consistent with metastatic disease. No new liver  lesion evident. 2. Interval resolution of the hypermetabolic soft tissue lesion along the right iliac fossa with no measurable soft tissue lesion remaining at this location today. 3. Similar appearance of soft tissue fullness in the rectum at the site of the hypermetabolic lesion seen previously. 4. Stable tiny bilateral pulmonary nodules. Continued attention on follow-up recommended. 5. Small volume free fluid in the pelvis. 6. Aortic Atherosclerosis (ICD10-I70.0).   01/22/2021 Imaging   CT CAP  IMPRESSION: CT CHEST IMPRESSION   1. Similar nonspecific pulmonary nodules. 2. New posterior left upper lobe reticulonodular opacity, suspicious for interval mild infection or inflammation. 3. No thoracic adenopathy.   CT ABDOMEN AND PELVIS IMPRESSION   1. Further decrease in size of high left hepatic lobe 3 mm low-density lesion. No new or progressive metastatic disease within the abdomen or pelvis. 2. Similar trace free pelvic fluid. 3. Similar nonspecific mid rectal wall thickening. 4.  Aortic Atherosclerosis (ICD10-I70.0).      CURRENT THERAPY:  First-line FOLFIRI q2weeks starting 08/10/20. dose reduced with cycle 1. Bevacizumab added with cycle 2 on 08/23/2020  INTERVAL HISTORY: Brittney Spencer is here for a follow up of metastatic rectal cancer. She was last seen by me 12/27/20 and by NP Lacie in the interim. She presents to the clinic alone.  She reports she has not had diarrhea since dose reduction with C3. She notes color change to her hands. She reports her appetite is good and she is eating well. She moves a lot and keeps herself busy. She notes some postnasal drip recently. She notes she had her Covid booster recently and wonders if they're related.  All other systems were reviewed with the patient and are negative.  MEDICAL HISTORY:  Past Medical History:  Diagnosis Date   AAA (abdominal aortic aneurysm) (HCC)    infrarenal 4.1 cmper s-9-19 scan on chart    Anemia    hx of   Anxiety    has PRN meds   Colon cancer ([place]) 2017   RIGHT hemi colectomy-s/p sx   GERD (gastroesophageal reflux disease)    OTC meds/diet control   Hypertension    on meds   Macular pucker, right eye 10/06/2019   Retinal detachment, right 09/2019   Vitamin D deficiency     SURGICAL HISTORY: Past Surgical History:  Procedure Laterality Date   COLONOSCOPY  2018   [facility]   COLONSCOPY  12/2015   IR IMAGING GUIDED PORT INSERTION  08/04/2020   LAPAROSCOPIC RIGHT HEMI COLECTOMY Right 02/07/2016   Procedure: LAPAROSCOPIC ASSISTED RIGHT HEMI COLECTOMY AND RIGHT SALPINGO OOPHERECTOMY;  Surgeon: [name], MD;  Location: [facility];  Service: General;  Laterality: Right;   RETINAL DETACHMENT SURGERY  09/2019    I have reviewed the social history and family history with the patient and they are unchanged from previous note.  ALLERGIES:  is allergic to fish allergy, peanut-containing drug products, soy allergy, and buspirone.  MEDICATIONS:  Current Outpatient Medications  Medication Sig Dispense Refill   ALPRAZolam (XANAX) 0.25 MG tablet Take 1 tablet (0.25 mg total) by mouth daily as needed for anxiety. 30 tablet 0   Cholecalciferol (VITAMIN D3 PO) Take by mouth daily.     diphenoxylate-atropine (LOMOTIL) 2.5-0.025 MG tablet Take 2 tablets by mouth 4 (  four) times daily as needed for diarrhea or loose stools. 45 tablet 3   docusate sodium (COLACE) 100 MG capsule 1 capsule as needed     lidocaine-prilocaine (EMLA) cream Apply 1 application topically as needed. 30 g 1   NORVASC 2.5 MG tablet TAKE 1 TABLET BY MOUTH EVERY DAY 90 tablet 1   ondansetron (ZOFRAN) 8 MG tablet Take 1 tablet (8 mg total) by mouth every 8 (eight) hours as needed for nausea or vomiting. 20 tablet 2   Current Facility-Administered Medications  Medication Dose Route Frequency Provider Last Rate Last Admin   0.9 %  sodium chloride infusion  500 mL Intravenous Once Doran Stabler, MD        Facility-Administered Medications Ordered in Other Visits  Medication Dose Route Frequency Provider Last Rate Last Admin   sodium chloride flush (NS) 0.9 % injection 10 mL  10 mL Intracatheter PRN [name], MD   10 mL at 01/24/21 1618    PHYSICAL EXAMINATION: ECOG PERFORMANCE STATUS: 1 - Symptomatic but completely ambulatory  Vitals:   01/24/21 1212  BP: 121/79  Pulse: 73  Resp: 20  Temp: 98.1 F (36.7 C)  SpO2: 100%    Filed Weights   01/24/21 1212  Weight: 96 lb 11.2 oz (43.9 kg)    GENERAL:alert, no distress and comfortable SKIN: skin color, texture, turgor are normal, no rashes or significant lesions EYES: normal, Conjunctiva are pink and non-injected, sclera clear  LYMPH:  no palpable lymphadenopathy in the cervical, axillary, or inguinal regions. ABDOMEN:abdomen soft, non-tender and normal bowel sounds NEURO: alert & oriented x 3 with fluent speech, no focal motor/sensory deficits  LABORATORY DATA:  I have reviewed the data as listed CBC Latest Ref Rng & Units 01/24/2021 01/10/2021 12/27/2020  WBC 4.0 - 10.5 K/uL 9.2 2.9(L) 12.3(H)  Hemoglobin 12.0 - 15.0 g/dL 8.8(L) 8.9(L) 8.8(L)  Hematocrit 36.0 - 46.0 % 28.4(L) 28.8(L) 28.3(L)  Platelets 150 - 400 K/uL 144(L) 206 177     CMP Latest Ref Rng & Units 01/24/2021 01/10/2021 12/27/2020  Glucose 70 - 99 mg/dL 85 82 76  BUN 8 - 23 mg/dL '12 12 14  ' Creatinine 0.44 - 1.00 mg/dL 0.76 0.70 0.73  Sodium 135 - 145 mmol/L 139 139 140  Potassium 3.5 - 5.1 mmol/L 3.7 3.8 3.5  Chloride 98 - 111 mmol/L 106 106 107  CO2 22 - 32 mmol/L '26 26 25  ' Calcium 8.9 - 10.3 mg/dL 8.6(L) 8.8(L) 9.0  Total Protein 6.5 - 8.1 g/dL 6.4(L) 6.4(L) 6.7  Total Bilirubin 0.3 - 1.2 mg/dL 0.2(L) 0.3 0.2(L)  Alkaline Phos 38 - 126 U/L 76 62 90  AST 15 - 41 U/L 13(L) 12(L) 13(L)  ALT 0 - 44 U/L <'6 6 6      ' RADIOGRAPHIC STUDIES: I have personally reviewed the radiological images as listed and agreed with the findings in the report. No results  found.   ASSESSMENT & PLAN:  BARBAR BREDE is a 70 y.o. female with   1. Rectal adenocarcinoma, with liver and right pelvic metastasis, recurrence from previous colon cancer vs new primary, MSS -Diagnosed on routine screening colonoscopy 05/2020, rectal mass biopsy showed invasive adenocarcinoma, arising from a tubular adenoma -Her 07/12/20 PET showed positive uptake of known rectal tumor and soft tissue mass within the right iliac fossa indicating recurrent prior colon cancer. There is also left lobe of liver lesion is FDG avid concerning for liver metastasis. pt declined liver biopsy -Whether this is metastatic  rectal or recurrent/metastatic colon cancer, this is likely not curable, but still treatable. She previously declined referral for HIPEC -She began first-line systemic chemo with dose-reduced FOLFIRI on 08/06/20. Bevacizumab added, and irinotecan/5FU increased with cycle 2, but due to poor tolerance/diarrhea irinotecan was dose reduced with cycle 3 and she has been tolerating well. -CT CAP 01/22/21 showed continued good response to treatment, her liver met has further decreased in size.  I discussed the indeterminate left upper lobe groundglass lung nodule, will continue monitoring. -The patient notes that she will be out of town for an event in [place] on August 1-6. Given her good response on recent CT, I will hold 5-FU pump and reduce irinotecan dose today. -She will return in 3 weeks to continue chemo -We discussed changing her chemo to maintenance Xeloda and bevacizumab after next restaging scan -will request FO on her rectal tumor biopsy    2. Cancer of ascending colon, [id number], stage IIIC, MSI-stable, (+) surgical margins at pelvic wall     -She was diagnosed in 12/2015. She is s/p right hemicolectomy with right salpingo oophorectomy and adjuvant ChemoRT. -she declined adjuvant chemo due to the concern of side effects and impact on her quality of life. -Her 04/2019 CT scan  was NED  -With recent rectal cancer diagnosis, her PET from 07/12/20 indicates soft tissue mass within the right iliac fossa is noted and consistent with local tumor recurrence from previous ascending colon tumor. -She previously declined option of liver biopsy.     3. Mild Anemia -Has been stable and mild for the past year.  -worse lately due to her newly diagnosed rectal cancer and starting chemo. Denies overt GI bleeding. -Moderate and stable lately. Hgb 8.8 today (01/24/21)   4. HTN, Anxiety -She'll follow-up with her primary care physician and continue medication -She uses Xanax as needed. She is stable.  -She continues to cope with her diagnosis and treatment.    5. Hypokalemia -She notes she is not taking K supplement. She instead increased potassium in diet.  -K normal today     Plan:  -proceed with dose-reduced C4 chemo with irinotecan and bevacizumab today due to her trip next week  -labs, f/u, and C5 02/15/21, will resume FOLFIRI and beva next cycle    No problem-specific Assessment & Plan notes found for this encounter.   No orders of the defined types were placed in this encounter.   All questions were answered. The patient knows to call the clinic with any problems, questions or concerns. No barriers to learning was detected. The total time spent in the appointment was 30 minutes.     [name], MD 01/24/2021

## 2021-01-26 ENCOUNTER — Inpatient Hospital Stay: Payer: Federal, State, Local not specified - PPO

## 2021-02-12 ENCOUNTER — Ambulatory Visit: Payer: Federal, State, Local not specified - PPO | Admitting: Hematology

## 2021-02-12 ENCOUNTER — Ambulatory Visit: Payer: Federal, State, Local not specified - PPO

## 2021-02-12 ENCOUNTER — Other Ambulatory Visit: Payer: Federal, State, Local not specified - PPO

## 2021-02-14 ENCOUNTER — Other Ambulatory Visit: Payer: Self-pay

## 2021-02-14 ENCOUNTER — Inpatient Hospital Stay: Payer: Federal, State, Local not specified - PPO

## 2021-02-14 ENCOUNTER — Encounter: Payer: Self-pay | Admitting: Hematology

## 2021-02-14 ENCOUNTER — Inpatient Hospital Stay: Payer: Federal, State, Local not specified - PPO | Attending: Nurse Practitioner

## 2021-02-14 ENCOUNTER — Inpatient Hospital Stay (HOSPITAL_BASED_OUTPATIENT_CLINIC_OR_DEPARTMENT_OTHER): Payer: Federal, State, Local not specified - PPO | Admitting: Hematology

## 2021-02-14 VITALS — BP 139/81 | HR 80 | Temp 98.4°F | Resp 18 | Wt 99.2 lb

## 2021-02-14 DIAGNOSIS — C20 Malignant neoplasm of rectum: Secondary | ICD-10-CM

## 2021-02-14 DIAGNOSIS — E876 Hypokalemia: Secondary | ICD-10-CM | POA: Diagnosis not present

## 2021-02-14 DIAGNOSIS — Z79899 Other long term (current) drug therapy: Secondary | ICD-10-CM | POA: Insufficient documentation

## 2021-02-14 DIAGNOSIS — Z5111 Encounter for antineoplastic chemotherapy: Secondary | ICD-10-CM | POA: Insufficient documentation

## 2021-02-14 DIAGNOSIS — Z1509 Genetic susceptibility to other malignant neoplasm: Secondary | ICD-10-CM | POA: Diagnosis not present

## 2021-02-14 DIAGNOSIS — F419 Anxiety disorder, unspecified: Secondary | ICD-10-CM | POA: Insufficient documentation

## 2021-02-14 DIAGNOSIS — C182 Malignant neoplasm of ascending colon: Secondary | ICD-10-CM

## 2021-02-14 DIAGNOSIS — C787 Secondary malignant neoplasm of liver and intrahepatic bile duct: Secondary | ICD-10-CM

## 2021-02-14 DIAGNOSIS — I10 Essential (primary) hypertension: Secondary | ICD-10-CM | POA: Diagnosis not present

## 2021-02-14 DIAGNOSIS — D649 Anemia, unspecified: Secondary | ICD-10-CM | POA: Insufficient documentation

## 2021-02-14 DIAGNOSIS — Z923 Personal history of irradiation: Secondary | ICD-10-CM | POA: Insufficient documentation

## 2021-02-14 DIAGNOSIS — Z5112 Encounter for antineoplastic immunotherapy: Secondary | ICD-10-CM | POA: Insufficient documentation

## 2021-02-14 DIAGNOSIS — Z95828 Presence of other vascular implants and grafts: Secondary | ICD-10-CM

## 2021-02-14 DIAGNOSIS — Z9221 Personal history of antineoplastic chemotherapy: Secondary | ICD-10-CM | POA: Diagnosis not present

## 2021-02-14 LAB — CBC WITH DIFFERENTIAL (CANCER CENTER ONLY)
Abs Immature Granulocytes: 0 10*3/uL (ref 0.00–0.07)
Basophils Absolute: 0 10*3/uL (ref 0.0–0.1)
Basophils Relative: 0 %
Eosinophils Absolute: 0.1 10*3/uL (ref 0.0–0.5)
Eosinophils Relative: 2 %
HCT: 29 % — ABNORMAL LOW (ref 36.0–46.0)
Hemoglobin: 9.1 g/dL — ABNORMAL LOW (ref 12.0–15.0)
Immature Granulocytes: 0 %
Lymphocytes Relative: 34 %
Lymphs Abs: 1 10*3/uL (ref 0.7–4.0)
MCH: 27.2 pg (ref 26.0–34.0)
MCHC: 31.4 g/dL (ref 30.0–36.0)
MCV: 86.8 fL (ref 80.0–100.0)
Monocytes Absolute: 0.4 10*3/uL (ref 0.1–1.0)
Monocytes Relative: 13 %
Neutro Abs: 1.5 10*3/uL — ABNORMAL LOW (ref 1.7–7.7)
Neutrophils Relative %: 51 %
Platelet Count: 203 10*3/uL (ref 150–400)
RBC: 3.34 MIL/uL — ABNORMAL LOW (ref 3.87–5.11)
RDW: 16.3 % — ABNORMAL HIGH (ref 11.5–15.5)
WBC Count: 2.9 10*3/uL — ABNORMAL LOW (ref 4.0–10.5)
nRBC: 0 % (ref 0.0–0.2)

## 2021-02-14 LAB — CMP (CANCER CENTER ONLY)
ALT: 10 U/L (ref 0–44)
AST: 15 U/L (ref 15–41)
Albumin: 3.3 g/dL — ABNORMAL LOW (ref 3.5–5.0)
Alkaline Phosphatase: 63 U/L (ref 38–126)
Anion gap: 9 (ref 5–15)
BUN: 18 mg/dL (ref 8–23)
CO2: 24 mmol/L (ref 22–32)
Calcium: 9 mg/dL (ref 8.9–10.3)
Chloride: 108 mmol/L (ref 98–111)
Creatinine: 0.79 mg/dL (ref 0.44–1.00)
GFR, Estimated: >60 mL/min (ref >60)
Glucose, Bld: 120 mg/dL — ABNORMAL HIGH (ref 70–99)
Potassium: 3.7 mmol/L (ref 3.5–5.1)
Sodium: 141 mmol/L (ref 135–145)
Total Bilirubin: 0.3 mg/dL (ref 0.3–1.2)
Total Protein: 6.6 g/dL (ref 6.5–8.1)

## 2021-02-14 LAB — TOTAL PROTEIN, URINE DIPSTICK: Protein, ur: NEGATIVE mg/dL

## 2021-02-14 MED ORDER — PALONOSETRON HCL INJECTION 0.25 MG/5ML
0.2500 mg | Freq: Once | INTRAVENOUS | Status: AC
  Administered 2021-02-14: 0.25 mg via INTRAVENOUS
  Filled 2021-02-14: qty 5

## 2021-02-14 MED ORDER — ATROPINE SULFATE 1 MG/ML IJ SOLN
0.5000 mg | Freq: Once | INTRAMUSCULAR | Status: AC | PRN
  Administered 2021-02-14: 0.5 mg via INTRAVENOUS
  Filled 2021-02-14: qty 1

## 2021-02-14 MED ORDER — SODIUM CHLORIDE 0.9% FLUSH
10.0000 mL | Freq: Once | INTRAVENOUS | Status: AC
  Administered 2021-02-14: 10 mL

## 2021-02-14 MED ORDER — SODIUM CHLORIDE 0.9 % IV SOLN
225.0000 mg | Freq: Once | INTRAVENOUS | Status: AC
  Administered 2021-02-14: 225 mg via INTRAVENOUS
  Filled 2021-02-14: qty 9

## 2021-02-14 MED ORDER — HEPARIN SOD (PORK) LOCK FLUSH 100 UNIT/ML IV SOLN
500.0000 [IU] | Freq: Once | INTRAVENOUS | Status: AC | PRN
  Administered 2021-02-14: 500 [IU]

## 2021-02-14 MED ORDER — SODIUM CHLORIDE 0.9% FLUSH
10.0000 mL | INTRAVENOUS | Status: DC | PRN
  Administered 2021-02-14: 10 mL

## 2021-02-14 MED ORDER — SODIUM CHLORIDE 0.9 % IV SOLN: Freq: Once | INTRAVENOUS | Status: AC

## 2021-02-14 MED ORDER — SODIUM CHLORIDE 0.9 % IV SOLN
130.0000 mg/m2 | Freq: Once | INTRAVENOUS | Status: AC
  Administered 2021-02-14: 180 mg via INTRAVENOUS
  Filled 2021-02-14: qty 9

## 2021-02-14 MED ORDER — SODIUM CHLORIDE 0.9 % IV SOLN
10.0000 mg | Freq: Once | INTRAVENOUS | Status: AC
  Administered 2021-02-14: 10 mg via INTRAVENOUS
  Filled 2021-02-14: qty 10

## 2021-02-14 NOTE — Patient Instructions (Signed)
[facility]  Discharge Instructions: Thank you for choosing [facility] to provide your oncology and hematology care.   If you have a lab appointment with the [facility], please go directly to the [name] and check in at the registration area.   Wear comfortable clothing and clothing appropriate for easy access to any Portacath or PICC line.   We strive to give you quality time with your provider. You may need to reschedule your appointment if you arrive late (15 or more minutes).  Arriving late affects you and other patients whose appointments are after yours.  Also, if you miss three or more appointments without notifying the office, you may be dismissed from the clinic at the provider's discretion.      For prescription refill requests, have your pharmacy contact our office and allow 72 hours for refills to be completed.    Today you received the following chemotherapy and/or immunotherapy agents Avastin and Irinotecan      To help prevent nausea and vomiting after your treatment, we encourage you to take your nausea medication as directed.  BELOW ARE SYMPTOMS THAT SHOULD BE REPORTED IMMEDIATELY: *FEVER GREATER THAN 100.4 F (38 C) OR HIGHER *CHILLS OR SWEATING *NAUSEA AND VOMITING THAT IS NOT CONTROLLED WITH YOUR NAUSEA MEDICATION *UNUSUAL SHORTNESS OF BREATH *UNUSUAL BRUISING OR BLEEDING *URINARY PROBLEMS (pain or burning when urinating, or frequent urination) *BOWEL PROBLEMS (unusual diarrhea, constipation, pain near the anus) TENDERNESS IN MOUTH AND THROAT WITH OR WITHOUT PRESENCE OF ULCERS (sore throat, sores in mouth, or a toothache) UNUSUAL RASH, SWELLING OR PAIN  UNUSUAL VAGINAL DISCHARGE OR ITCHING   Items with * indicate a potential emergency and should be followed up as soon as possible or go to the Emergency Department if any problems should occur.  Please show the CHEMOTHERAPY ALERT CARD or IMMUNOTHERAPY ALERT CARD at  check-in to the Emergency Department and triage nurse.  Should you have questions after your visit or need to cancel or reschedule your appointment, please contact [facility]  Dept: [phone]  and follow the prompts.  Office hours are 8:00 a.m. to 4:30 p.m. Monday - Friday. Please note that voicemails left after 4:00 p.m. may not be returned until the following business day.  We are closed weekends and major holidays. You have access to a nurse at all times for urgent questions. Please call the main number to the clinic Dept: [phone] and follow the prompts.   For any non-urgent questions, you may also contact your provider using MyChart. We now offer e-Visits for anyone 11 and older to request care online for non-urgent symptoms. For details visit [email or web address].   Also download the MyChart app! Go to the app store, search "MyChart", open the app, select [facility], and log in with your MyChart username and password.  Due to Covid, a mask is required upon entering the hospital/clinic. If you do not have a mask, one will be given to you upon arrival. For doctor visits, patients may have 1 support person aged 28 or older with them. For treatment visits, patients cannot have anyone with them due to current Covid guidelines and our immunocompromised population.

## 2021-02-14 NOTE — Progress Notes (Signed)
MD would like to adjust zirabev dose for updated weight   Larene Beach, PharmD

## 2021-02-14 NOTE — Progress Notes (Signed)
Brittney Spencer   [phone] Fax:([phone]) [phone]   Clinic Follow up Note   Patient Care Team: Patient, No Pcp Per (Inactive) as PCP - General (General Practice) [name], NP as Nurse Practitioner (Oncology) [name], MD as Consulting Physician (Oncology) [name], RN (Inactive) as Oncology Nurse Navigator  Date of Service:  02/14/2021  CHIEF COMPLAINT: f/u of metastatic rectal cancer, h/o colon cancer  SUMMARY OF ONCOLOGIC HISTORY: Oncology History Overview Note  Cancer Staging Cancer of ascending colon [facility]) Staging form: Colon and Rectum, AJCC 7th Edition - Clinical stage from 02/07/2016: Stage IIIC (T4b, N1b, M0) - Signed by [name], MD on 03/04/2016 Laterality: Right Residual tumor (R): R2 - Macroscopic    Cancer of ascending colon ([place])  10/25/2015 Imaging   CT ABD/PELVIS:  Inflammatory changes inferior to the cecal tip appear improved, there is still irregular soft tissue thickening of the cecal tip, and there are adjacent prominent lymph nodes in the ileocolonic mesentery, measuring 13 mm on image 49 and 8 mm on image 52. In addition, there is a 2.5 x 1.8 cm nodule on image 46 which has central low density. Therefore, these findings are moderately suspicious for an underlying cecal malignancy with perforation.    01/19/2016 Procedure   COLONOSCOPY per Dr. [name]: Fungating, ulcerated mass almost obstructing mid ascending colon   01/19/2016 Initial Biopsy   Diagnosis Surgical [P], cecal mass - INVASIVE ADENOCARCINOMA WITH ULCERATION. - SEE COMMENT.   02/05/2016 Tumor Marker   Patient's tumor was tested for the following markers: CEA Results of the tumor marker test revealed 5.7.   02/07/2016 Initial Diagnosis   Cancer of ascending colon ([place])   02/07/2016 Definitive Surgery   Laparoscopic assisted right hemicolectomy and right salpingo oopherectomy--Dr. [name]   02/07/2016 Pathologic Stage   p T4 N1b   2/43 nodes +    02/07/2016 Pathology Results   MMR normal; G2 adenocarcinoma;proximal & distal margins negative; soft tissue mass on pelvic sidewall + for adenocarcinoma with positive margin MSI Stable   03/08/2016 Imaging   CT chest negative for metastasis.    03/19/2016 - 04/25/2016 Radiation Therapy   Adjuvant irradiation, 50 gray in 28 fractions   03/19/2016 - 04/22/2016 Chemotherapy   Xeloda 1500 mg twice daily, started on 03/19/2016, dose reduced to 1000 mg twice daily from week 3 due to neutropenia, and patient stopped 3 days before last dose radiation due to difficulty swallowing the pill    05/20/2016 -  Adjuvant Chemotherapy   Patient declined adjuvant chemotherapy   09/16/2016 Imaging   CT CAP w Contrast 1. No evidence of local tumor recurrence at the ileocolic anastomosis. 2. No findings suspicious for metastatic disease in the chest, abdomen or pelvis. 3. Nonspecific trace free fluid in the pelvic cul-de-sac. 4. Stable solitary 3 mm right upper lobe pulmonary nodule, for which 6 month stability has been demonstrated, probably benign. 5. Additional findings include stable right posterior pericardial cyst and small calcified uterine fibroids.   05/13/2017 Imaging   CT CAP W Contrast 05/13/17 IMPRESSION: 1. No current findings of residual or recurrent malignancy. 2. Mild prominence of stool throughout the colon. Nondistended portions of the rectum. 3. Several tiny pulmonary nodules are stable from the earliest available comparison of 03/08/2016 and probably benign, but may merit surveillance. 4. Other imaging findings of potential clinical significance: Old granulomatous disease. Aortoiliac atherosclerotic vascular disease. Lumbar spondylosis and degenerative disc disease. Stable amount of trace free pelvic fluid.   04/27/2018 Imaging  04/27/2018 CT CAP IMPRESSION: Stable exam. No evidence of recurrent or metastatic carcinoma within the chest, abdomen, or pelvis   04/19/2019  Imaging   CT CAP W Contrast  IMPRESSION: Chest Impression:   1. No evidence of thoracic metastasis. 2. Stable small bilateral pulmonary nodules.   Abdomen / Pelvis Impression:   1. No evidence local colorectal carcinoma recurrence or metastasis in the abdomen pelvis. 2. Post RIGHT hemicolectomy.   01/22/2021 Imaging   CT CAP  IMPRESSION: CT CHEST IMPRESSION   1. Similar nonspecific pulmonary nodules. 2. New posterior left upper lobe reticulonodular opacity, suspicious for interval mild infection or inflammation. 3. No thoracic adenopathy.   CT ABDOMEN AND PELVIS IMPRESSION   1. Further decrease in size of high left hepatic lobe 3 mm low-density lesion. No new or progressive metastatic disease within the abdomen or pelvis. 2. Similar trace free pelvic fluid. 3. Similar nonspecific mid rectal wall thickening. 4.  Aortic Atherosclerosis (ICD10-I70.0).   Rectal cancer metastasized to liver ([place])  06/15/2020 Procedure   Screening Colonoscopy by Dr [name]  IMPRESSION - Decreased sphincter tone and internal hemorrhoids that prolapse with straining, but require manual replacement into the anal canal (Grade III) found on digital rectal exam. - Patent side-to-side ileo-colonic anastomosis, characterized by healthy appearing mucosa. - The examined portion of the ileum was normal. - One diminutive polyp in the proximal transverse colon, removed with a cold biopsy forceps. Resected and retrieved. - Likely malignant partially obstructing tumor in the mid rectum. Biopsied. Tattooed. - The examination was otherwise normal on direct and retroflexion views.   06/15/2020 Initial Biopsy   Diagnosis 1. Transverse Colon Polyp - HYPERPLASTIC POLYP 2. Rectum, biopsy - ADENOCARCINOMA ARISING IN A TUBULAR ADENOMA WITH HIGH-GRADE DYSPLASIA. SEE NOTE Diagnosis Note 2. Dr. [name] reviewed the case and concurs with the diagnosis. Dr. [name] was notified on 06/16/2020.   06/28/2020 Imaging    CT CAP  IMPRESSION: 1. New low-density focus in the anterior aspect of the lateral segment LEFT hepatic lobe measuring 1.2 x 1.0 cm, compatible with small metastatic lesion in the LEFT hepatic lobe. 2. Soft tissue in the RIGHT iliac fossa following RIGHT hemicolectomy invades the psoas musculature and is slowly enlarging over time, more linear on the prior study now highly concerning for recurrence/metastasis to this location. 3. Signs of enteritis, potentially post radiation changes of the small bowel. Tethered small bowel in the RIGHT lower quadrant shows focal thickening and narrowing suspicious for small bowel involvement and developing partial obstruction though currently contrast passes beyond this point into the colon. 4. Rectal thickening in this patient with known rectal mass as described. 5. No evidence of metastatic disease in the chest. 6. Stable small pulmonary nodules. 7.  and aortic atherosclerosis.   Aortic Atherosclerosis (ICD10-I70.0) and Emphysema (ICD10-J43.9).   07/04/2020 Initial Diagnosis   Rectal cancer metastasized to liver ([place])   07/12/2020 PET scan   IMPRESSION: 1. Exam positive for FDG avid rectal tumor which corresponds to the recent colonoscopy findings. 2. FDG avid soft tissue mass within the right iliac fossa is noted and consistent with local tumor recurrence from previous ascending colon tumor. 3. Lateral segment left lobe of liver lesion is FDG avid concerning for liver metastasis. 4. No specific findings identified to suggest metastatic disease to the chest.   08/10/2020 -  Chemotherapy   First-line FOLFIRI q2weeks starting 08/10/20. dose reduced with cycle 1. Irinotecan/5FU increased and Bevacizumab added with cycle 2 on 08/23/2020    10/27/2020 Imaging  CT CAP  IMPRESSION: 1. Interval decrease in size of the hypermetabolic left hepatic lesion, consistent with metastatic disease. No new liver lesion evident. 2. Interval resolution of the  hypermetabolic soft tissue lesion along the right iliac fossa with no measurable soft tissue lesion remaining at this location today. 3. Similar appearance of soft tissue fullness in the rectum at the site of the hypermetabolic lesion seen previously. 4. Stable tiny bilateral pulmonary nodules. Continued attention on follow-up recommended. 5. Small volume free fluid in the pelvis. 6. Aortic Atherosclerosis (ICD10-I70.0).   01/22/2021 Imaging   CT CAP  IMPRESSION: CT CHEST IMPRESSION   1. Similar nonspecific pulmonary nodules. 2. New posterior left upper lobe reticulonodular opacity, suspicious for interval mild infection or inflammation. 3. No thoracic adenopathy.   CT ABDOMEN AND PELVIS IMPRESSION   1. Further decrease in size of high left hepatic lobe 3 mm low-density lesion. No new or progressive metastatic disease within the abdomen or pelvis. 2. Similar trace free pelvic fluid. 3. Similar nonspecific mid rectal wall thickening. 4.  Aortic Atherosclerosis (ICD10-I70.0).      CURRENT THERAPY:  First-line FOLFIRI q2weeks starting 08/10/20. dose reduced with cycle 1. Bevacizumab added with cycle 2 on 08/23/2020  INTERVAL HISTORY:  Brittney Spencer is here for a follow up of metastatic rectal cancer. She was last seen by me on 01/24/21. She presents to the clinic alone. She reports doing well overall. She reports she has felt better over the last three weeks due to only receiving irinotecan and bevacizumab at her last visit. She notes she has not had diarrhea or fatigue.   All other systems were reviewed with the patient and are negative.  MEDICAL HISTORY:  Past Medical History:  Diagnosis Date   AAA (abdominal aortic aneurysm) (HCC)    infrarenal 4.1 cmper s-9-19 scan on chart   Anemia    hx of   Anxiety    has PRN meds   Colon cancer ([place]) 2017   RIGHT hemi colectomy-s/p sx   GERD (gastroesophageal reflux disease)    OTC meds/diet control   Hypertension    on  meds   Macular pucker, right eye 10/06/2019   Retinal detachment, right 09/2019   Vitamin D deficiency     SURGICAL HISTORY: Past Surgical History:  Procedure Laterality Date   COLONOSCOPY  2018   [facility]   COLONSCOPY  12/2015   IR IMAGING GUIDED PORT INSERTION  08/04/2020   LAPAROSCOPIC RIGHT HEMI COLECTOMY Right 02/07/2016   Procedure: LAPAROSCOPIC ASSISTED RIGHT HEMI COLECTOMY AND RIGHT SALPINGO OOPHERECTOMY;  Surgeon: [name], MD;  Location: [facility];  Service: General;  Laterality: Right;   RETINAL DETACHMENT SURGERY  09/2019    I have reviewed the social history and family history with the patient and they are unchanged from previous note.  ALLERGIES:  is allergic to fish allergy, peanut-containing drug products, soy allergy, and buspirone.  MEDICATIONS:  Current Outpatient Medications  Medication Sig Dispense Refill   ALPRAZolam (XANAX) 0.25 MG tablet Take 1 tablet (0.25 mg total) by mouth daily as needed for anxiety. 30 tablet 0   Cholecalciferol (VITAMIN D3 PO) Take by mouth daily.     diphenoxylate-atropine (LOMOTIL) 2.5-0.025 MG tablet Take 2 tablets by mouth 4 (four) times daily as needed for diarrhea or loose stools. 45 tablet 3   docusate sodium (COLACE) 100 MG capsule 1 capsule as needed     lidocaine-prilocaine (EMLA) cream Apply 1 application topically as needed. 30 g 1   NORVASC  2.5 MG tablet TAKE 1 TABLET BY MOUTH EVERY DAY 90 tablet 1   ondansetron (ZOFRAN) 8 MG tablet Take 1 tablet (8 mg total) by mouth every 8 (eight) hours as needed for nausea or vomiting. 20 tablet 2   Current Facility-Administered Medications  Medication Dose Route Frequency Provider Last Rate Last Admin   0.9 %  sodium chloride infusion  500 mL Intravenous Once Doran Stabler, MD       Facility-Administered Medications Ordered in Other Visits  Medication Dose Route Frequency Provider Last Rate Last Admin   sodium chloride flush (NS) 0.9 % injection 10 mL  10 mL Intracatheter PRN  [name], MD   10 mL at 02/14/21 1552    PHYSICAL EXAMINATION: ECOG PERFORMANCE STATUS: 1 - Symptomatic but completely ambulatory  Vitals:   02/14/21 1116  BP: 139/81  Pulse: 80  Resp: 18  Temp: 98.4 F (36.9 C)  SpO2: 100%   Wt Readings from Last 3 Encounters:  02/14/21 99 lb 3.2 oz (45 kg)  01/24/21 96 lb 11.2 oz (43.9 kg)  01/10/21 95 lb 9.6 oz (43.4 kg)     GENERAL:alert, no distress and comfortable SKIN: skin color normal, no rashes or significant lesions EYES: normal, Conjunctiva are pink and non-injected, sclera clear  NEURO: alert & oriented x 3 with fluent speech  LABORATORY DATA:  I have reviewed the data as listed CBC Latest Ref Rng & Units 02/14/2021 01/24/2021 01/10/2021  WBC 4.0 - 10.5 K/uL 2.9(L) 9.2 2.9(L)  Hemoglobin 12.0 - 15.0 g/dL 9.1(L) 8.8(L) 8.9(L)  Hematocrit 36.0 - 46.0 % 29.0(L) 28.4(L) 28.8(L)  Platelets 150 - 400 K/uL 203 144(L) 206     CMP Latest Ref Rng & Units 02/14/2021 01/24/2021 01/10/2021  Glucose 70 - 99 mg/dL 120(H) 85 82  BUN 8 - 23 mg/dL '18 12 12  ' Creatinine 0.44 - 1.00 mg/dL 0.79 0.76 0.70  Sodium 135 - 145 mmol/L 141 139 139  Potassium 3.5 - 5.1 mmol/L 3.7 3.7 3.8  Chloride 98 - 111 mmol/L 108 106 106  CO2 22 - 32 mmol/L '24 26 26  ' Calcium 8.9 - 10.3 mg/dL 9.0 8.6(L) 8.8(L)  Total Protein 6.5 - 8.1 g/dL 6.6 6.4(L) 6.4(L)  Total Bilirubin 0.3 - 1.2 mg/dL 0.3 0.2(L) 0.3  Alkaline Phos 38 - 126 U/L 63 76 62  AST 15 - 41 U/L 15 13(L) 12(L)  ALT 0 - 44 U/L 10 <6 6      RADIOGRAPHIC STUDIES: I have personally reviewed the radiological images as listed and agreed with the findings in the report. No results found.   ASSESSMENT & PLAN:  SHANEQUE MERKLE is a 70 y.o. female with   1. Rectal adenocarcinoma, with liver and right pelvic metastasis, recurrence from previous colon cancer vs new primary, MSS, KRAS mutation (+)  -Diagnosed on routine screening colonoscopy 05/2020, rectal mass biopsy showed invasive adenocarcinoma,  arising from a tubular adenoma -Her 07/12/20 PET showed positive uptake of known rectal tumor and soft tissue mass within the right iliac fossa indicating recurrent prior colon cancer. There is also left lobe of liver lesion is FDG avid concerning for liver metastasis. pt declined liver biopsy -Whether this is metastatic rectal or recurrent/metastatic colon cancer, this is likely not curable, but still treatable. She previously declined referral for HIPEC -She began first-line systemic chemo with dose-reduced FOLFIRI on 08/06/20. Bevacizumab added, and irinotecan/5FU increased with cycle 2, but due to poor tolerance/diarrhea irinotecan was dose reduced with cycle 3 and  she has been tolerating well. -CT CAP 01/22/21 showed continued good response to treatment, her liver met has further decreased in size.  I discussed the indeterminate left upper lobe groundglass lung nodule, will continue monitoring. -FO was obtained on her rectal tumor biopsy showing MSI stable disease, tumor burden low, KRAS mutation (+), no other targetable mutation.  I discussed results with patient in detail. -She received dose-reduced irinotecan and bevacizumab on 01/24/21 due to travel. She feels she tolerated this much better than with the 5-FU pump. As such, she would prefer to continue holding the 5-FU for now. I think that's reasonable, we will change her treatment to irinotecan and bevacizumab maintenance therapy.  I will return her to previous dose irinotecan.   2. Cancer of ascending colon, pT4bN1bM0, stage IIIC, MSI-stable, (+) surgical margins at pelvic wall     -She was diagnosed in 12/2015. She is s/p right hemicolectomy with right salpingo oophorectomy and adjuvant ChemoRT. -she declined adjuvant chemo due to the concern of side effects and impact on her quality of life. -Her 04/2019 CT scan was NED  -With recent rectal cancer diagnosis, her PET from 07/12/20 indicates soft tissue mass within the right iliac fossa is noted and  consistent with local tumor recurrence from previous ascending colon tumor. -She previously declined option of liver biopsy.     3. Mild Anemia -Has been stable and mild for the past year.  -worse lately due to her newly diagnosed rectal cancer and starting chemo. Denies overt GI bleeding. -Moderate and stable lately. Hgb 9.1 today (02/14/21)   4. HTN, Anxiety -She'll follow-up with her primary care physician and continue medication -She uses Xanax as needed. She is stable.  -She continues to cope with her diagnosis and treatment.    5. Hypokalemia -She notes she is not taking K supplement. She instead increased potassium in diet.  -K normal today    6. 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 -I discussed that germline ATM mutation increased risk of breast cancer, and I recommend her to have genetic testing.  She will think about it and let me know.    Plan:  -proceed with C5 chemo with irinotecan and bevacizumab today, will cancel 5FU/LV  -labs, f/u, and C6 02/28/21    No problem-specific Assessment & Plan notes found for this encounter.   No orders of the defined types were placed in this encounter.  All questions were answered. The patient knows to call the clinic with any problems, questions or concerns. No barriers to learning was detected. The total time spent in the appointment was 30 minutes.     [name], MD 02/14/2021   I, Wilburn Mylar, am acting as scribe for [name], MD.   I have reviewed the above documentation for accuracy and completeness, and I agree with the above.

## 2021-02-15 ENCOUNTER — Telehealth: Payer: Self-pay | Admitting: Hematology

## 2021-02-15 NOTE — Telephone Encounter (Signed)
Rescheduled upcoming appointment due to provider's breast clinic. Patient is aware of changes. 

## 2021-02-16 ENCOUNTER — Inpatient Hospital Stay: Payer: Federal, State, Local not specified - PPO

## 2021-02-19 ENCOUNTER — Encounter (INDEPENDENT_AMBULATORY_CARE_PROVIDER_SITE_OTHER): Payer: Self-pay

## 2021-02-20 ENCOUNTER — Ambulatory Visit (INDEPENDENT_AMBULATORY_CARE_PROVIDER_SITE_OTHER): Payer: Federal, State, Local not specified - PPO | Admitting: Ophthalmology

## 2021-02-20 ENCOUNTER — Encounter (INDEPENDENT_AMBULATORY_CARE_PROVIDER_SITE_OTHER): Payer: Self-pay | Admitting: Ophthalmology

## 2021-02-20 ENCOUNTER — Other Ambulatory Visit: Payer: Self-pay

## 2021-02-20 DIAGNOSIS — H43822 Vitreomacular adhesion, left eye: Secondary | ICD-10-CM | POA: Diagnosis not present

## 2021-02-20 DIAGNOSIS — H35341 Macular cyst, hole, or pseudohole, right eye: Secondary | ICD-10-CM | POA: Diagnosis not present

## 2021-02-20 DIAGNOSIS — H35371 Puckering of macula, right eye: Secondary | ICD-10-CM | POA: Insufficient documentation

## 2021-02-20 NOTE — Progress Notes (Signed)
02/20/2021     CHIEF COMPLAINT Patient presents for  Chief Complaint  Patient presents with   Retina Evaluation      HISTORY OF PRESENT ILLNESS: Brittney Spencer is a 70 y.o. female who presents to the clinic today for:   HPI     Retina Evaluation           Laterality: right eye   Onset: 1 week ago   Duration: 1 week         Comments   WIP- mac hole OD, referred by Dr. Gwynn Burly Pt. states she has limited vision in the right eye. Pt. states "it was pretty much itchy and scratchy about a week ago. I called here and they suggested I talk to Dr. Gwynn Burly. Based off what Dr. 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  Last visit here September 26, 2020 with excellent visual acuity 20/40 and completely resolved condition with no sign of macular pucker or epiretinal membrane at that time.        Last edited by Hurman Horn, MD on 02/20/2021  9:42 AM.      Referring physician: No referring provider defined for this encounter.  HISTORICAL INFORMATION:   Selected notes from the MEDICAL RECORD NUMBER       CURRENT MEDICATIONS: No current outpatient medications on file. (Ophthalmic Drugs)   No current facility-administered medications for this visit. (Ophthalmic Drugs)   Current Outpatient Medications (Other)  Medication Sig   ALPRAZolam (XANAX) 0.25 MG tablet Take 1 tablet (0.25 mg total) by mouth daily as needed for anxiety.   Cholecalciferol (VITAMIN D3 PO) Take by mouth daily.   diphenoxylate-atropine (LOMOTIL) 2.5-0.025 MG tablet Take 2 tablets by mouth 4 (four) times daily as needed for diarrhea or loose stools.   docusate sodium (COLACE) 100 MG capsule 1 capsule as needed   lidocaine-prilocaine (EMLA) cream Apply 1 application topically as needed.   NORVASC 2.5 MG tablet TAKE 1 TABLET BY MOUTH EVERY DAY   ondansetron (ZOFRAN) 8 MG tablet Take 1  tablet (8 mg total) by mouth every 8 (eight) hours as needed for nausea or vomiting.   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 Some residual intraretinal fluid remains, very small localized subfoveal of fluid remains although this continues to slowly resorb. We'll continue to observe.  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   Resolved March 2021 post vitrectomy   Past Surgical History:  Procedure Laterality Date   COLONOSCOPY  2018   [facility]   COLONSCOPY  12/2015   IR IMAGING GUIDED PORT INSERTION  08/04/2020   LAPAROSCOPIC RIGHT HEMI COLECTOMY Right 02/07/2016   Procedure: LAPAROSCOPIC ASSISTED RIGHT HEMI COLECTOMY AND RIGHT SALPINGO OOPHERECTOMY;  Surgeon: [name], MD;  Location: [facility];  Service: General;  Laterality: Right;   RETINAL DETACHMENT SURGERY  09/2019    FAMILY HISTORY Family History  Problem Relation Age of Onset   Cancer Mother        lung cancer   Cancer Maternal Grandfather        unknown cancer    Colon cancer  Neg Hx    Esophageal cancer Neg Hx    Rectal cancer Neg Hx    Stomach cancer Neg Hx    Colon polyps Neg Hx     SOCIAL HISTORY Social History   Tobacco Use   Smoking status: Never   Smokeless tobacco: Never  Vaping Use   Vaping Use: Never used  Substance Use Topics   Alcohol use: No    Alcohol/week: 0.0 standard drinks   Drug use: No         OPHTHALMIC EXAM:  Base Eye Exam     Visual Acuity (Snellen - Linear)       Right Left   Dist cc 20/400 20/25 -2   Dist ph cc NI          Tonometry (Tonopen, 9:00 AM)       Right Left   Pressure 8 8         Pupils       Pupils Dark Light Shape React APD   Right PERRL 4 3 Round Brisk None   Left PERRL 4 3 Round Brisk None         Visual Fields (Counting fingers)       Left Right    Full Full         Extraocular Movement       Right Left    Full Full         Neuro/Psych     Oriented x3: Yes   Mood/Affect: Normal         Dilation     Both eyes: 1.0% Mydriacyl, 2.5% Phenylephrine @ 9:00 AM           Slit Lamp and Fundus Exam     External Exam       Right Left   External Normal Normal         Slit Lamp Exam       Right Left   Lids/Lashes Normal Normal   Conjunctiva/Sclera White and quiet White and quiet   Cornea Clear Clear   Anterior Chamber Deep and quiet Deep and quiet   Iris Round and reactive Round and reactive   Lens Posterior chamber intraocular lens 2+ Nuclear sclerosis   Anterior Vitreous Normal Normal         Fundus Exam       Right Left   Posterior Vitreous Vitrectomized Normal   Disc Normal Normal   C/D Ratio 0.2 0.25   Macula Macular hole stage IV, with perifoveal CME. Normal   Vessels Normal Normal   Periphery Normal Normal            IMAGING AND PROCEDURES  Imaging and Procedures for  02/20/21  OCT, Retina - OU - Both Eyes       Right Eye Quality was good. Scan locations included subfoveal. Central Foveal Thickness: 458. Progression has  improved. Findings include abnormal foveal contour, macular hole, cystoid macular edema.   Left Eye Quality was good. Central Foveal Thickness: 275. Progression has been stable. Findings include epiretinal membrane, vitreomacular adhesion .   Notes New onset macular hole over the last 5 months.  Epiretinal membrane seen nasal aspect giving the cantilever appearance to the edges of the macular hole with perifoveal CME.     Color Fundus Photography Optos - OU - Both Eyes       Right Eye Progression has improved. Disc findings include normal observations. Macula : macular hole, epiretinal membrane. Vessels : normal observations. Periphery : normal observations.   Left Eye Progression has been stable. Disc findings include normal observations. Macula : normal observations. Vessels : normal observations.   Notes OD, macular hole , with shiny epiretinal membrane.             ASSESSMENT/PLAN:  Vitreomacular traction, left Minor OS no impact on acuity  Macular hole of right eye New onset full-thickness macular hole likely secondary to epiretinal membrane development and centripetal traction leading to attenuation of the fovea and macular hole formation  OD will need surgical repair via vitrectomy epiretinal and ILM peel right eye with gas injection.  These findings discussed reviewed with patient with analogies as well as my eye models.     ICD-10-CM   1. Macular hole of right eye  H35.341 OCT, Retina - OU - Both Eyes    Color Fundus Photography Optos - OU - Both Eyes    2. Macular pucker, right eye  H35.371 OCT, Retina - OU - Both Eyes    3. Vitreomacular traction, left  H43.822       1.  OD with profound vision loss on new development of full-thickness macular hole not related to vitreomacular traction but rather to epiretinal membrane formation which is new over the last 5 months.  Surgical intervention will be similar to her previous experience with vitrectomy membrane  peel in this case and again gas injection with positioning required face downward slightly in the reading position during waking hours  2.  Patient has ongoing oncologic therapy bi weekly, and we will thus schedule surgical intervention on 03-07-2021  3.  Ophthalmic Meds Ordered this visit:  No orders of the defined types were placed in this encounter.      Return ,,, [facility], 03-07-2021, for Schedule vitrectomy membrane [phone], OD.  There are no Patient Instructions on file for this visit.   Explained the diagnoses, plan, and follow up with the patient and they expressed understanding.  Patient expressed understanding of the importance of proper follow up care.   Clent Demark Havier Deeb M.D. Diseases & Surgery of the Retina and Vitreous [facility] [place] 02/20/21     Abbreviations: M myopia (nearsighted); A astigmatism; H hyperopia (farsighted); P presbyopia; Mrx spectacle prescription;  CTL contact lenses; OD right eye; OS left eye; OU both eyes  XT exotropia; ET esotropia; PEK punctate epithelial keratitis; PEE punctate epithelial erosions; DES dry eye syndrome; MGD meibomian gland dysfunction; ATs artificial tears; PFAT's preservative free artificial tears; [place] nuclear sclerotic cataract; PSC posterior subcapsular cataract; ERM epi-retinal membrane; PVD posterior vitreous detachment; RD retinal detachment; DM diabetes mellitus; DR diabetic retinopathy; NPDR non-proliferative diabetic retinopathy; PDR proliferative diabetic retinopathy; CSME clinically  significant macular edema; DME diabetic macular edema; dbh dot blot hemorrhages; CWS cotton wool spot; POAG primary open angle glaucoma; C/D cup-to-disc ratio; HVF humphrey visual field; GVF goldmann visual field; OCT optical coherence tomography; IOP intraocular pressure; BRVO Branch retinal vein occlusion; CRVO central retinal vein occlusion; CRAO central retinal artery occlusion; BRAO branch retinal artery  occlusion; RT retinal tear; SB scleral buckle; PPV pars plana vitrectomy; VH Vitreous hemorrhage; PRP panretinal laser photocoagulation; IVK intravitreal kenalog; VMT vitreomacular traction; MH Macular hole;  NVD neovascularization of the disc; NVE neovascularization elsewhere; AREDS age related eye disease study; ARMD age related macular degeneration; POAG primary open angle glaucoma; EBMD epithelial/anterior basement membrane dystrophy; ACIOL anterior chamber intraocular lens; IOL intraocular lens; PCIOL posterior chamber intraocular lens; Phaco/IOL phacoemulsification with intraocular lens placement; [place] photorefractive keratectomy; LASIK laser assisted in situ keratomileusis; HTN hypertension; DM diabetes mellitus; COPD chronic obstructive pulmonary disease

## 2021-02-20 NOTE — Assessment & Plan Note (Signed)
New onset full-thickness macular hole likely secondary to epiretinal membrane development and centripetal traction leading to attenuation of the fovea and macular hole formation  OD will need surgical repair via vitrectomy epiretinal and ILM peel right eye with gas injection.  These findings discussed reviewed with patient with analogies as well as my eye models.

## 2021-02-20 NOTE — Assessment & Plan Note (Signed)
Minor OS no impact on acuity

## 2021-02-21 ENCOUNTER — Encounter (INDEPENDENT_AMBULATORY_CARE_PROVIDER_SITE_OTHER): Payer: Self-pay

## 2021-02-21 ENCOUNTER — Ambulatory Visit (INDEPENDENT_AMBULATORY_CARE_PROVIDER_SITE_OTHER): Payer: Federal, State, Local not specified - PPO

## 2021-02-21 DIAGNOSIS — H35341 Macular cyst, hole, or pseudohole, right eye: Secondary | ICD-10-CM

## 2021-02-21 MED ORDER — OFLOXACIN 0.3 % OP SOLN: 1.0000 [drp] | Freq: Four times a day (QID) | OPHTHALMIC | 0 refills | Status: AC

## 2021-02-21 MED ORDER — PREDNISOLONE ACETATE 1 % OP SUSP: 1.0000 [drp] | Freq: Four times a day (QID) | OPHTHALMIC | 0 refills | Status: AC

## 2021-02-21 NOTE — Progress Notes (Signed)
02/21/2021     CHIEF COMPLAINT Patient presents for Pre-op Exam   HISTORY OF PRESENT ILLNESS: Brittney Spencer is a 70 y.o. female who presents to the clinic today for:   HPI   Pt is here for pre-op OD sx 09/072022 pt will have vitrectomy and membrane peel. Pt states [facility] OU stable since last visit. Pt denies FOL, floaters, or ocular pain OU.   Last edited by Kendra Opitz, COA on 02/21/2021  2:01 PM.        HISTORICAL INFORMATION:   Selected notes from the [facility]: No current outpatient medications on file. (Ophthalmic Drugs)   No current facility-administered medications for this visit. (Ophthalmic Drugs)   Current Outpatient Medications (Other)  Medication Sig   ALPRAZolam (XANAX) 0.25 MG tablet Take 1 tablet (0.25 mg total) by mouth daily as needed for anxiety.   Cholecalciferol (VITAMIN D3 PO) Take by mouth daily.   diphenoxylate-atropine (LOMOTIL) 2.5-0.025 MG tablet Take 2 tablets by mouth 4 (four) times daily as needed for diarrhea or loose stools.   docusate sodium (COLACE) 100 MG capsule 1 capsule as needed   lidocaine-prilocaine (EMLA) cream Apply 1 application topically as needed.   NORVASC 2.5 MG tablet TAKE 1 TABLET BY MOUTH EVERY DAY   ondansetron (ZOFRAN) 8 MG tablet Take 1 tablet (8 mg total) by mouth every 8 (eight) hours as needed for nausea or vomiting.   Current Facility-Administered Medications (Other)  Medication Route   0.9 %  sodium chloride infusion Intravenous     ALLERGIES Allergies  Allergen Reactions   Fish Allergy Itching and Swelling   Peanut-Containing Drug Products     Itching throat   Soy Allergy Other (See Comments)    Stomach aches   Buspirone Anxiety    PAST MEDICAL HISTORY Past Medical History:  Diagnosis Date   AAA (abdominal aortic aneurysm) (HCC)    infrarenal 4.1 cmper s-9-19 scan on chart   Anemia    hx of   Anxiety    has PRN meds   Asteroid hyalosis of right eye  10/06/2019   Colon cancer ([place]) 2017   RIGHT hemi colectomy-s/p sx   GERD (gastroesophageal reflux disease)    OTC meds/diet control   Hypertension    on meds   Macular pucker, right eye 10/06/2019   Retinal detachment, 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 Some residual intraretinal fluid remains, very small localized subfoveal of fluid remains although this continues to slowly resorb. We'll continue to observe.   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   Resolved March 2021 post vitrectomy   Past Surgical History:  Procedure Laterality Date   COLONOSCOPY  2018   HD-[facility]   COLONSCOPY  12/2015   IR IMAGING GUIDED PORT INSERTION  08/04/2020   LAPAROSCOPIC RIGHT HEMI COLECTOMY Right 02/07/2016   Procedure: LAPAROSCOPIC ASSISTED RIGHT HEMI COLECTOMY AND RIGHT SALPINGO OOPHERECTOMY;  Surgeon: [name], MD;  Location: [facility];  Service: General;  Laterality: Right;   RETINAL DETACHMENT SURGERY  09/2019    FAMILY HISTORY Family History  Problem Relation Age of Onset   Cancer Mother        lung cancer   Cancer Maternal Grandfather        unknown cancer    Colon cancer Neg Hx  Esophageal cancer Neg Hx    Rectal cancer Neg Hx    Stomach cancer Neg Hx    Colon polyps Neg Hx     SOCIAL HISTORY Social History   Tobacco Use   Smoking status: Never   Smokeless tobacco: Never  Vaping Use   Vaping Use: Never used  Substance Use Topics   Alcohol use: No    Alcohol/week: 0.0 standard drinks   Drug use: No         OPHTHALMIC EXAM:  Base Eye Exam     Visual Acuity (ETDRS)       Right Left   Dist cc CF at 3' 20/25    Correction: Glasses         Tonometry (Tonopen, 2:04 PM)       Right Left   Pressure 10          Pupils       Pupils Dark Light Shape React APD   Right PERRL 4 3 Round Brisk None   Left PERRL  4 3 Round Brisk None         Neuro/Psych     Oriented x3: Yes   Mood/Affect: Normal         Dilation     No Dilation            Slit Lamp and Fundus Exam     External Exam       Right Left   External Normal Normal         Slit Lamp Exam       Right Left   Lids/Lashes Normal Normal   Conjunctiva/Sclera White and quiet White and quiet   Cornea Clear Clear   Anterior Chamber Deep and quiet Deep and quiet   Iris Round and reactive Round and reactive   Lens Posterior chamber intraocular lens 2+ Nuclear sclerosis   Anterior Vitreous Normal Normal         Fundus Exam       Right Left   Posterior Vitreous Vitrectomized Normal   Disc Normal Normal   C/D Ratio 0.2 0.25   Macula Macular hole stage IV, with perifoveal CME. Normal   Vessels Normal Normal   Periphery Normal Normal            IMAGING AND PROCEDURES  Imaging and Procedures for '@TODAY'$ @           ASSESSMENT/PLAN:  No diagnosis found.  Ophthalmic Meds Ordered this visit:  No orders of the defined types were placed in this encounter.       Pre-op completed. Operative consent obtained with pre-op eye drops reviewed with Collene Leyden and sent via [facility] as needed. Post op instructions reviewed with patient and per patient all questions answered.  [place], COA

## 2021-02-26 ENCOUNTER — Telehealth: Payer: Self-pay | Admitting: *Deleted

## 2021-02-26 NOTE — Telephone Encounter (Signed)
Patient noticed she still has appts for Pump Stop on her schedule. She thought she wasn't receiving that anymore and wants to know if they can be cancelled.  Dr. Burr Medico notified and ok'd cancelling appts for pump start/stop.  Appts cancelled.

## 2021-02-27 MED FILL — Dexamethasone Sodium Phosphate Inj 100 MG/10ML: INTRAMUSCULAR | Qty: 1 | Status: AC

## 2021-02-28 ENCOUNTER — Inpatient Hospital Stay: Payer: Federal, State, Local not specified - PPO | Admitting: Hematology

## 2021-02-28 ENCOUNTER — Other Ambulatory Visit: Payer: Self-pay

## 2021-02-28 ENCOUNTER — Inpatient Hospital Stay: Payer: Federal, State, Local not specified - PPO

## 2021-02-28 ENCOUNTER — Encounter: Payer: Self-pay | Admitting: Hematology

## 2021-02-28 VITALS — BP 125/77 | HR 79 | Temp 98.5°F | Resp 18 | Wt 98.1 lb

## 2021-02-28 DIAGNOSIS — C20 Malignant neoplasm of rectum: Secondary | ICD-10-CM | POA: Diagnosis not present

## 2021-02-28 DIAGNOSIS — C182 Malignant neoplasm of ascending colon: Secondary | ICD-10-CM

## 2021-02-28 DIAGNOSIS — C787 Secondary malignant neoplasm of liver and intrahepatic bile duct: Secondary | ICD-10-CM | POA: Diagnosis not present

## 2021-02-28 DIAGNOSIS — Z95828 Presence of other vascular implants and grafts: Secondary | ICD-10-CM

## 2021-02-28 DIAGNOSIS — Z5112 Encounter for antineoplastic immunotherapy: Secondary | ICD-10-CM | POA: Diagnosis not present

## 2021-02-28 LAB — CMP (CANCER CENTER ONLY)
ALT: 9 U/L (ref 0–44)
AST: 15 U/L (ref 15–41)
Albumin: 3.3 g/dL — ABNORMAL LOW (ref 3.5–5.0)
Alkaline Phosphatase: 59 U/L (ref 38–126)
Anion gap: 7 (ref 5–15)
BUN: 12 mg/dL (ref 8–23)
CO2: 26 mmol/L (ref 22–32)
Calcium: 8.9 mg/dL (ref 8.9–10.3)
Chloride: 107 mmol/L (ref 98–111)
Creatinine: 0.79 mg/dL (ref 0.44–1.00)
GFR, Estimated: >60 mL/min (ref >60)
Glucose, Bld: 102 mg/dL — ABNORMAL HIGH (ref 70–99)
Potassium: 3.8 mmol/L (ref 3.5–5.1)
Sodium: 140 mmol/L (ref 135–145)
Total Bilirubin: 0.3 mg/dL (ref 0.3–1.2)
Total Protein: 6.5 g/dL (ref 6.5–8.1)

## 2021-02-28 LAB — CBC WITH DIFFERENTIAL (CANCER CENTER ONLY)
Abs Immature Granulocytes: 0 10*3/uL (ref 0.00–0.07)
Basophils Absolute: 0 10*3/uL (ref 0.0–0.1)
Basophils Relative: 1 %
Eosinophils Absolute: 0.1 10*3/uL (ref 0.0–0.5)
Eosinophils Relative: 5 %
HCT: 30.3 % — ABNORMAL LOW (ref 36.0–46.0)
Hemoglobin: 9.3 g/dL — ABNORMAL LOW (ref 12.0–15.0)
Immature Granulocytes: 0 %
Lymphocytes Relative: 41 %
Lymphs Abs: 1 10*3/uL (ref 0.7–4.0)
MCH: 26.8 pg (ref 26.0–34.0)
MCHC: 30.7 g/dL (ref 30.0–36.0)
MCV: 87.3 fL (ref 80.0–100.0)
Monocytes Absolute: 0.2 10*3/uL (ref 0.1–1.0)
Monocytes Relative: 9 %
Neutro Abs: 1.1 10*3/uL — ABNORMAL LOW (ref 1.7–7.7)
Neutrophils Relative %: 44 %
Platelet Count: 156 10*3/uL (ref 150–400)
RBC: 3.47 MIL/uL — ABNORMAL LOW (ref 3.87–5.11)
RDW: 15.7 % — ABNORMAL HIGH (ref 11.5–15.5)
WBC Count: 2.4 10*3/uL — ABNORMAL LOW (ref 4.0–10.5)
nRBC: 0 % (ref 0.0–0.2)

## 2021-02-28 LAB — CEA (IN HOUSE-CHCC): CEA (CHCC-In House): 4.41 ng/mL (ref 0.00–5.00)

## 2021-02-28 MED ORDER — HEPARIN SOD (PORK) LOCK FLUSH 100 UNIT/ML IV SOLN
500.0000 [IU] | Freq: Once | INTRAVENOUS | Status: AC | PRN
  Administered 2021-02-28: 500 [IU]

## 2021-02-28 MED ORDER — ATROPINE SULFATE 1 MG/ML IJ SOLN
0.5000 mg | Freq: Once | INTRAMUSCULAR | Status: DC | PRN
  Filled 2021-02-28: qty 1

## 2021-02-28 MED ORDER — SODIUM CHLORIDE 0.9% FLUSH
10.0000 mL | INTRAVENOUS | Status: DC | PRN
  Administered 2021-02-28: 10 mL

## 2021-02-28 MED ORDER — SODIUM CHLORIDE 0.9 % IV SOLN
10.0000 mg | Freq: Once | INTRAVENOUS | Status: AC
  Administered 2021-02-28: 10 mg via INTRAVENOUS
  Filled 2021-02-28: qty 10

## 2021-02-28 MED ORDER — SODIUM CHLORIDE 0.9 % IV SOLN: Freq: Once | INTRAVENOUS | Status: AC

## 2021-02-28 MED ORDER — SODIUM CHLORIDE 0.9 % IV SOLN
225.0000 mg | Freq: Once | INTRAVENOUS | Status: AC
  Administered 2021-02-28: 225 mg via INTRAVENOUS
  Filled 2021-02-28: qty 9

## 2021-02-28 MED ORDER — SODIUM CHLORIDE 0.9% FLUSH
10.0000 mL | Freq: Once | INTRAVENOUS | Status: AC
Start: 2021-02-28 — End: 2021-02-28
  Administered 2021-02-28: 10 mL

## 2021-02-28 MED ORDER — PALONOSETRON HCL INJECTION 0.25 MG/5ML
0.2500 mg | Freq: Once | INTRAVENOUS | Status: AC
  Administered 2021-02-28: 0.25 mg via INTRAVENOUS
  Filled 2021-02-28: qty 5

## 2021-02-28 MED ORDER — SODIUM CHLORIDE 0.9 % IV SOLN
130.0000 mg/m2 | Freq: Once | INTRAVENOUS | Status: AC
  Administered 2021-02-28: 180 mg via INTRAVENOUS
  Filled 2021-02-28: qty 9

## 2021-02-28 NOTE — Progress Notes (Addendum)
[place]   [phone] [phone]   Clinic Follow up Note   Patient Care Team: Patient, No Pcp Per (Inactive) as PCP - General (General Practice) [name], NP as Nurse Practitioner (Oncology) [name], MD as Consulting Physician (Oncology) [name], RN (Inactive) as Oncology Nurse Navigator  Date of Service:  02/28/2021  CHIEF COMPLAINT: f/u of metastatic rectal cancer, h/o colon cancer  CURRENT THERAPY:  First-line FOLFIRI q2weeks starting 08/10/20. dose reduced with cycle 1. Bevacizumab added with cycle 2 on 08/23/2020  ASSESSMENT & PLAN:  Brittney Spencer is a 70 y.o. female with   1. Rectal adenocarcinoma, with liver and right pelvic metastasis, recurrence from previous colon cancer vs new primary, MSS, KRAS mutation (+)  -Diagnosed on routine screening colonoscopy 05/2020, rectal mass biopsy showed invasive adenocarcinoma, arising from a tubular adenoma -Her 07/12/20 PET showed positive uptake of known rectal tumor and soft tissue mass within the right iliac fossa indicating recurrent prior colon cancer. There is also left lobe of liver lesion is FDG avid concerning for liver metastasis. pt declined liver biopsy -She began first-line systemic chemo with dose-reduced FOLFIRI on 08/06/20. Bevacizumab added, and irinotecan/5FU increased with cycle 2, but due to poor tolerance/diarrhea irinotecan was dose reduced with cycle 3 and she has been tolerating well. -CT CAP 01/22/21 showed continued good response to treatment, her liver met has further decreased in size.  I discussed the indeterminate left upper lobe groundglass lung nodule, will continue monitoring. -FO was obtained on her rectal tumor biopsy showing MSI stable disease, tumor burden low, KRAS mutation (+), no other targetable mutation.  -Due to patient preference, she was changed to irinotecan and bevacizumab maintenance therapy. I reviewed her recent tumor marker results,  which have been slightly increasing recently. Today's result is pending. If it is further elevated, we will discuss adding back 5FU. -labs reviewed, overall adequate to proceed with treatment today, but her blood counts are low. We will schedule pegfilgrastim for tomorrow.    2. Cancer of ascending colon, pT4bN1bM0, stage IIIC, MSI-stable, (+) surgical margins at pelvic wall     -She was diagnosed in 12/2015. She is s/p right hemicolectomy with right salpingo oophorectomy and adjuvant ChemoRT. -she declined adjuvant chemo due to the concern of side effects and impact on her quality of life. -Her 04/2019 CT scan was NED  -With recent rectal cancer diagnosis, her PET from 07/12/20 indicates soft tissue mass within the right iliac fossa is noted and consistent with local tumor recurrence from previous ascending colon tumor.   3. Mild Anemia -Has been stable and mild for the past year.  -worse lately due to her newly diagnosed rectal cancer and starting chemo. Denies overt GI bleeding. -Moderate and stable lately. Hgb 9.3 today (02/28/21)   4. HTN, Anxiety -She'll follow-up with her primary care physician and continue medication -She uses Xanax as needed. She is stable.  -She continues to cope with her diagnosis and treatment.    5. Hypokalemia -resolved with increased potassium in diet   6. 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 -I discussed that germline ATM mutation increased risk of breast cancer, and I recommend her to have genetic testing.  She will think about it and let me know.     Plan:  -proceed with C6 chemo with irinotecan and bevacizumab today -labs, f/u, and C7 in 2 weeks.  -we discussed constipation management.  No problem-specific Assessment & Plan notes found for this encounter.   SUMMARY OF ONCOLOGIC HISTORY: Oncology History Overview Note  Cancer Staging Cancer of ascending colon [facility]) Staging form:  Colon and Rectum, AJCC 7th Edition - Clinical stage from 02/07/2016: Stage IIIC (T4b, N1b, M0) - Signed by [name], MD on 03/04/2016 Laterality: Right Residual tumor (R): R2 - Macroscopic    Cancer of ascending colon ([place])  10/25/2015 Imaging   CT ABD/PELVIS:  Inflammatory changes inferior to the cecal tip appear improved, there is still irregular soft tissue thickening of the cecal tip, and there are adjacent prominent lymph nodes in the ileocolonic mesentery, measuring 13 mm on image 49 and 8 mm on image 52. In addition, there is a 2.5 x 1.8 cm nodule on image 46 which has central low density. Therefore, these findings are moderately suspicious for an underlying cecal malignancy with perforation.    01/19/2016 Procedure   COLONOSCOPY per Dr. [name]: Fungating, ulcerated mass almost obstructing mid ascending colon   01/19/2016 Initial Biopsy   Diagnosis Surgical [P], cecal mass - INVASIVE ADENOCARCINOMA WITH ULCERATION. - SEE COMMENT.   02/05/2016 Tumor Marker   Patient's tumor was tested for the following markers: CEA Results of the tumor marker test revealed 5.7.   02/07/2016 Initial Diagnosis   Cancer of ascending colon ([place])   02/07/2016 Definitive Surgery   Laparoscopic assisted right hemicolectomy and right salpingo oopherectomy--Dr. [name]   02/07/2016 Pathologic Stage   p T4 N1b   2/43 nodes +   02/07/2016 Pathology Results   MMR normal; G2 adenocarcinoma;proximal & distal margins negative; soft tissue mass on pelvic sidewall + for adenocarcinoma with positive margin MSI Stable   03/08/2016 Imaging   CT chest negative for metastasis.    03/19/2016 - 04/25/2016 Radiation Therapy   Adjuvant irradiation, 50 gray in 28 fractions   03/19/2016 - 04/22/2016 Chemotherapy   Xeloda 1500 mg twice daily, started on 03/19/2016, dose reduced to 1000 mg twice daily from week 3 due to neutropenia, and patient stopped 3 days before last dose radiation due to difficulty swallowing the  pill    05/20/2016 -  Adjuvant Chemotherapy   Patient declined adjuvant chemotherapy   09/16/2016 Imaging   CT CAP w Contrast 1. No evidence of local tumor recurrence at the ileocolic anastomosis. 2. No findings suspicious for metastatic disease in the chest, abdomen or pelvis. 3. Nonspecific trace free fluid in the pelvic cul-de-sac. 4. Stable solitary 3 mm right upper lobe pulmonary nodule, for which 6 month stability has been demonstrated, probably benign. 5. Additional findings include stable right posterior pericardial cyst and small calcified uterine fibroids.   05/13/2017 Imaging   CT CAP W Contrast 05/13/17 IMPRESSION: 1. No current findings of residual or recurrent malignancy. 2. Mild prominence of stool throughout the colon. Nondistended portions of the rectum. 3. Several tiny pulmonary nodules are stable from the earliest available comparison of 03/08/2016 and probably benign, but may merit surveillance. 4. Other imaging findings of potential clinical significance: Old granulomatous disease. Aortoiliac atherosclerotic vascular disease. Lumbar spondylosis and degenerative disc disease. Stable amount of trace free pelvic fluid.   04/27/2018 Imaging   04/27/2018 CT CAP IMPRESSION: Stable exam. No evidence of recurrent or metastatic carcinoma within the chest, abdomen, or pelvis   04/19/2019 Imaging   CT CAP W Contrast  IMPRESSION: Chest Impression:   1. No evidence of thoracic metastasis. 2. Stable small bilateral pulmonary nodules.   Abdomen / Pelvis Impression:   1. No evidence local  colorectal carcinoma recurrence or metastasis in the abdomen pelvis. 2. Post RIGHT hemicolectomy.   01/22/2021 Imaging   CT CAP  IMPRESSION: CT CHEST IMPRESSION   1. Similar nonspecific pulmonary nodules. 2. New posterior left upper lobe reticulonodular opacity, suspicious for interval mild infection or inflammation. 3. No thoracic adenopathy.   CT ABDOMEN AND PELVIS  IMPRESSION   1. Further decrease in size of high left hepatic lobe 3 mm low-density lesion. No new or progressive metastatic disease within the abdomen or pelvis. 2. Similar trace free pelvic fluid. 3. Similar nonspecific mid rectal wall thickening. 4.  Aortic Atherosclerosis (ICD10-I70.0).   Rectal cancer metastasized to liver ([place])  06/15/2020 Procedure   Screening Colonoscopy by Dr [name]  IMPRESSION - Decreased sphincter tone and internal hemorrhoids that prolapse with straining, but require manual replacement into the anal canal (Grade III) found on digital rectal exam. - Patent side-to-side ileo-colonic anastomosis, characterized by healthy appearing mucosa. - The examined portion of the ileum was normal. - One diminutive polyp in the proximal transverse colon, removed with a cold biopsy forceps. Resected and retrieved. - Likely malignant partially obstructing tumor in the mid rectum. Biopsied. Tattooed. - The examination was otherwise normal on direct and retroflexion views.   06/15/2020 Initial Biopsy   Diagnosis 1. Transverse Colon Polyp - HYPERPLASTIC POLYP 2. Rectum, biopsy - ADENOCARCINOMA ARISING IN A TUBULAR ADENOMA WITH HIGH-GRADE DYSPLASIA. SEE NOTE Diagnosis Note 2. Dr. [name] reviewed the case and concurs with the diagnosis. Dr. [name] was notified on 06/16/2020.   06/28/2020 Imaging   CT CAP  IMPRESSION: 1. New low-density focus in the anterior aspect of the lateral segment LEFT hepatic lobe measuring 1.2 x 1.0 cm, compatible with small metastatic lesion in the LEFT hepatic lobe. 2. Soft tissue in the RIGHT iliac fossa following RIGHT hemicolectomy invades the psoas musculature and is slowly enlarging over time, more linear on the prior study now highly concerning for recurrence/metastasis to this location. 3. Signs of enteritis, potentially post radiation changes of the small bowel. Tethered small bowel in the RIGHT lower quadrant shows focal thickening  and narrowing suspicious for small bowel involvement and developing partial obstruction though currently contrast passes beyond this point into the colon. 4. Rectal thickening in this patient with known rectal mass as described. 5. No evidence of metastatic disease in the chest. 6. Stable small pulmonary nodules. 7.  and aortic atherosclerosis.   Aortic Atherosclerosis (ICD10-I70.0) and Emphysema (ICD10-J43.9).   07/04/2020 Initial Diagnosis   Rectal cancer metastasized to liver ([place])   07/12/2020 PET scan   IMPRESSION: 1. Exam positive for FDG avid rectal tumor which corresponds to the recent colonoscopy findings. 2. FDG avid soft tissue mass within the right iliac fossa is noted and consistent with local tumor recurrence from previous ascending colon tumor. 3. Lateral segment left lobe of liver lesion is FDG avid concerning for liver metastasis. 4. No specific findings identified to suggest metastatic disease to the chest.   08/10/2020 -  Chemotherapy   First-line FOLFIRI q2weeks starting 08/10/20. dose reduced with cycle 1. Irinotecan/5FU increased and Bevacizumab added with cycle 2 on 08/23/2020    10/27/2020 Imaging   CT CAP  IMPRESSION: 1. Interval decrease in size of the hypermetabolic left hepatic lesion, consistent with metastatic disease. No new liver lesion evident. 2. Interval resolution of the hypermetabolic soft tissue lesion along the right iliac fossa with no measurable soft tissue lesion remaining at this location today. 3. Similar appearance of soft tissue fullness in the rectum  at the site of the hypermetabolic lesion seen previously. 4. Stable tiny bilateral pulmonary nodules. Continued attention on follow-up recommended. 5. Small volume free fluid in the pelvis. 6. Aortic Atherosclerosis (ICD10-I70.0).   01/22/2021 Imaging   CT CAP  IMPRESSION: CT CHEST IMPRESSION   1. Similar nonspecific pulmonary nodules. 2. New posterior left upper lobe  reticulonodular opacity, suspicious for interval mild infection or inflammation. 3. No thoracic adenopathy.   CT ABDOMEN AND PELVIS IMPRESSION   1. Further decrease in size of high left hepatic lobe 3 mm low-density lesion. No new or progressive metastatic disease within the abdomen or pelvis. 2. Similar trace free pelvic fluid. 3. Similar nonspecific mid rectal wall thickening. 4.  Aortic Atherosclerosis (ICD10-I70.0).      INTERVAL HISTORY:  Brittney Spencer is here for a follow up of metastatic renal cancer. She was last seen by me on 02/14/21. She presents to the clinic alone. She reports her last cycle felt different. She notes more constipation. She denies any more fatigue. She notes she is scheduled for another eye procedure on 03/08/21.   All other systems were reviewed with the patient and are negative.  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 Some residual intraretinal fluid remains, very small localized subfoveal of fluid remains although this continues to slowly resorb. We'll continue to observe.   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   Resolved March 2021 post vitrectomy    SURGICAL HISTORY: Past Surgical History:  Procedure Laterality Date   COLONOSCOPY  2018   [facility]   COLONSCOPY  12/2015   IR IMAGING GUIDED PORT INSERTION  08/04/2020   LAPAROSCOPIC RIGHT HEMI COLECTOMY  Right 02/07/2016   Procedure: LAPAROSCOPIC ASSISTED RIGHT HEMI COLECTOMY AND RIGHT SALPINGO OOPHERECTOMY;  Surgeon: [name], MD;  Location: [facility];  Service: General;  Laterality: Right;   RETINAL DETACHMENT SURGERY  09/2019    I have reviewed the social history and family history with the patient and they are unchanged from previous note.  ALLERGIES:  is allergic to fish allergy, peanut-containing drug products, soy allergy, and buspirone.  MEDICATIONS:  Current Outpatient Medications  Medication Sig Dispense Refill   ALPRAZolam (XANAX) 0.25 MG tablet Take 1 tablet (0.25 mg total) by mouth daily as needed for anxiety. 30 tablet 0   Cholecalciferol (VITAMIN D3 PO) Take by mouth daily.     diphenoxylate-atropine (LOMOTIL) 2.5-0.025 MG tablet Take 2 tablets by mouth 4 (four) times daily as needed for diarrhea or loose stools. 45 tablet 3   docusate sodium (COLACE) 100 MG capsule 1 capsule as needed     lidocaine-prilocaine (EMLA) cream Apply 1 application topically as needed. 30 g 1   NORVASC 2.5 MG tablet TAKE 1 TABLET BY MOUTH EVERY DAY 90 tablet 1   ondansetron (ZOFRAN) 8 MG tablet Take 1  tablet (8 mg total) by mouth every 8 (eight) hours as needed for nausea or vomiting. 20 tablet 2   Current Facility-Administered Medications  Medication Dose Route Frequency Provider Last Rate Last Admin   0.9 %  sodium chloride infusion  500 mL Intravenous Once Danis, Estill Cotta III, MD        PHYSICAL EXAMINATION: ECOG PERFORMANCE STATUS: 1 - Symptomatic but completely ambulatory  Vitals:   02/28/21 1104  BP: 125/77  Pulse: 79  Resp: 18  Temp: 98.5 F (36.9 C)  SpO2: 99%   Wt Readings from Last 3 Encounters:  02/28/21 98 lb 2 oz (44.5 kg)  02/14/21 99 lb 3.2 oz (45 kg)  01/24/21 96 lb 11.2 oz (43.9 kg)     GENERAL:alert, no distress and comfortable SKIN: skin color normal, no rashes or significant lesions EYES: normal, Conjunctiva are pink and non-injected, sclera clear  NEURO:  alert & oriented x 3 with fluent speech  LABORATORY DATA:  I have reviewed the data as listed CBC Latest Ref Rng & Units 02/28/2021 02/14/2021 01/24/2021  WBC 4.0 - 10.5 K/uL 2.4(L) 2.9(L) 9.2  Hemoglobin 12.0 - 15.0 g/dL 9.3(L) 9.1(L) 8.8(L)  Hematocrit 36.0 - 46.0 % 30.3(L) 29.0(L) 28.4(L)  Platelets 150 - 400 K/uL 156 203 144(L)     CMP Latest Ref Rng & Units 02/28/2021 02/14/2021 01/24/2021  Glucose 70 - 99 mg/dL 102(H) 120(H) 85  BUN 8 - 23 mg/dL '12 18 12  ' Creatinine 0.44 - 1.00 mg/dL 0.79 0.79 0.76  Sodium 135 - 145 mmol/L 140 141 139  Potassium 3.5 - 5.1 mmol/L 3.8 3.7 3.7  Chloride 98 - 111 mmol/L 107 108 106  CO2 22 - 32 mmol/L '26 24 26  ' Calcium 8.9 - 10.3 mg/dL 8.9 9.0 8.6(L)  Total Protein 6.5 - 8.1 g/dL 6.5 6.6 6.4(L)  Total Bilirubin 0.3 - 1.2 mg/dL 0.3 0.3 0.2(L)  Alkaline Phos 38 - 126 U/L 59 63 76  AST 15 - 41 U/L 15 15 13(L)  ALT 0 - 44 U/L 9 10 <6      RADIOGRAPHIC STUDIES: I have personally reviewed the radiological images as listed and agreed with the findings in the report. No results found.    No orders of the defined types were placed in this encounter.  All questions were answered. The patient knows to call the clinic with any problems, questions or concerns. No barriers to learning was detected. The total time spent in the appointment was 30 minutes.     [name], MD 02/28/2021   I, Wilburn Mylar, am acting as scribe for [name], MD.   I have reviewed the above documentation for accuracy and completeness, and I agree with the above.

## 2021-02-28 NOTE — Progress Notes (Signed)
ANC 1.1.  MD aware and is ok to treat with chemotherapy.

## 2021-02-28 NOTE — Patient Instructions (Signed)
[facility]  Discharge Instructions: Thank you for choosing [facility] to provide your oncology and hematology care.   If you have a lab appointment with the [facility], please go directly to the [facility] and check in at the registration area.   Wear comfortable clothing and clothing appropriate for easy access to any Portacath or PICC line.   We strive to give you quality time with your provider. You may need to reschedule your appointment if you arrive late (15 or more minutes).  Arriving late affects you and other patients whose appointments are after yours.  Also, if you miss three or more appointments without notifying the office, you may be dismissed from the clinic at the provider's discretion.      For prescription refill requests, have your pharmacy contact our office and allow 72 hours for refills to be completed.    Today you received the following chemotherapy and/or immunotherapy agents Irinotecan and Avastin      To help prevent nausea and vomiting after your treatment, we encourage you to take your nausea medication as directed.  BELOW ARE SYMPTOMS THAT SHOULD BE REPORTED IMMEDIATELY: *FEVER GREATER THAN 100.4 F (38 C) OR HIGHER *CHILLS OR SWEATING *NAUSEA AND VOMITING THAT IS NOT CONTROLLED WITH YOUR NAUSEA MEDICATION *UNUSUAL SHORTNESS OF BREATH *UNUSUAL BRUISING OR BLEEDING *URINARY PROBLEMS (pain or burning when urinating, or frequent urination) *BOWEL PROBLEMS (unusual diarrhea, constipation, pain near the anus) TENDERNESS IN MOUTH AND THROAT WITH OR WITHOUT PRESENCE OF ULCERS (sore throat, sores in mouth, or a toothache) UNUSUAL RASH, SWELLING OR PAIN  UNUSUAL VAGINAL DISCHARGE OR ITCHING   Items with * indicate a potential emergency and should be followed up as soon as possible or go to the Emergency Department if any problems should occur.  Please show the CHEMOTHERAPY ALERT CARD or IMMUNOTHERAPY ALERT CARD at  check-in to the Emergency Department and triage nurse.  Should you have questions after your visit or need to cancel or reschedule your appointment, please contact [facility]  Dept: [phone]  and follow the prompts.  Office hours are 8:00 a.m. to 4:30 p.m. Monday - Friday. Please note that voicemails left after 4:00 p.m. may not be returned until the following business day.  We are closed weekends and major holidays. You have access to a nurse at all times for urgent questions. Please call the main number to the clinic Dept: [phone] and follow the prompts.   For any non-urgent questions, you may also contact your provider using MyChart. We now offer e-Visits for anyone 34 and older to request care online for non-urgent symptoms. For details visit [email or web address].   Also download the MyChart app! Go to the app store, search "MyChart", open the app, select [facility], and log in with your MyChart username and password.  Due to Covid, a mask is required upon entering the hospital/clinic. If you do not have a mask, one will be given to you upon arrival. For doctor visits, patients may have 1 support person aged 45 or older with them. For treatment visits, patients cannot have anyone with them due to current Covid guidelines and our immunocompromised population.

## 2021-03-01 ENCOUNTER — Inpatient Hospital Stay: Payer: Federal, State, Local not specified - PPO | Attending: Nurse Practitioner

## 2021-03-01 ENCOUNTER — Other Ambulatory Visit: Payer: Self-pay

## 2021-03-01 ENCOUNTER — Telehealth: Payer: Self-pay | Admitting: Hematology

## 2021-03-01 VITALS — BP 112/69 | HR 73 | Temp 98.4°F | Resp 16

## 2021-03-01 DIAGNOSIS — Z1509 Genetic susceptibility to other malignant neoplasm: Secondary | ICD-10-CM | POA: Insufficient documentation

## 2021-03-01 DIAGNOSIS — C787 Secondary malignant neoplasm of liver and intrahepatic bile duct: Secondary | ICD-10-CM | POA: Insufficient documentation

## 2021-03-01 DIAGNOSIS — D649 Anemia, unspecified: Secondary | ICD-10-CM | POA: Insufficient documentation

## 2021-03-01 DIAGNOSIS — C20 Malignant neoplasm of rectum: Secondary | ICD-10-CM

## 2021-03-01 DIAGNOSIS — Z5111 Encounter for antineoplastic chemotherapy: Secondary | ICD-10-CM | POA: Insufficient documentation

## 2021-03-01 DIAGNOSIS — Z5112 Encounter for antineoplastic immunotherapy: Secondary | ICD-10-CM | POA: Insufficient documentation

## 2021-03-01 DIAGNOSIS — Z9221 Personal history of antineoplastic chemotherapy: Secondary | ICD-10-CM | POA: Insufficient documentation

## 2021-03-01 DIAGNOSIS — Z923 Personal history of irradiation: Secondary | ICD-10-CM | POA: Diagnosis not present

## 2021-03-01 DIAGNOSIS — F419 Anxiety disorder, unspecified: Secondary | ICD-10-CM | POA: Insufficient documentation

## 2021-03-01 DIAGNOSIS — T451X5A Adverse effect of antineoplastic and immunosuppressive drugs, initial encounter: Secondary | ICD-10-CM

## 2021-03-01 DIAGNOSIS — Z79899 Other long term (current) drug therapy: Secondary | ICD-10-CM | POA: Diagnosis not present

## 2021-03-01 DIAGNOSIS — C182 Malignant neoplasm of ascending colon: Secondary | ICD-10-CM | POA: Insufficient documentation

## 2021-03-01 DIAGNOSIS — I10 Essential (primary) hypertension: Secondary | ICD-10-CM | POA: Diagnosis not present

## 2021-03-01 DIAGNOSIS — R112 Nausea with vomiting, unspecified: Secondary | ICD-10-CM

## 2021-03-01 MED ORDER — PEGFILGRASTIM-JMDB 6 MG/0.6ML ~~LOC~~ SOSY
6.0000 mg | PREFILLED_SYRINGE | Freq: Once | SUBCUTANEOUS | Status: AC
  Administered 2021-03-01: 6 mg via SUBCUTANEOUS
  Filled 2021-03-01: qty 0.6

## 2021-03-01 NOTE — Telephone Encounter (Signed)
Left message with follow-up appointment per 8/31 los. 

## 2021-03-07 ENCOUNTER — Encounter (AMBULATORY_SURGERY_CENTER): Payer: Federal, State, Local not specified - PPO | Admitting: Ophthalmology

## 2021-03-07 DIAGNOSIS — H35341 Macular cyst, hole, or pseudohole, right eye: Secondary | ICD-10-CM | POA: Diagnosis not present

## 2021-03-08 ENCOUNTER — Other Ambulatory Visit: Payer: Self-pay

## 2021-03-08 ENCOUNTER — Ambulatory Visit (INDEPENDENT_AMBULATORY_CARE_PROVIDER_SITE_OTHER): Payer: Federal, State, Local not specified - PPO | Admitting: Ophthalmology

## 2021-03-08 ENCOUNTER — Encounter (INDEPENDENT_AMBULATORY_CARE_PROVIDER_SITE_OTHER): Payer: Self-pay | Admitting: Ophthalmology

## 2021-03-08 DIAGNOSIS — H35341 Macular cyst, hole, or pseudohole, right eye: Secondary | ICD-10-CM

## 2021-03-08 NOTE — Progress Notes (Signed)
03/08/2021     CHIEF COMPLAINT Patient presents for  Chief Complaint  Patient presents with   Post-op Follow-up      HISTORY OF PRESENT ILLNESS: Brittney Spencer is a 70 y.o. female who presents to the clinic today for:   HPI     Post-op Follow-up   In right eye.  Discomfort includes foreign body sensation (Pt states "it feels like there is something in it.") and tearing.  Negative for pain, itching and discharge.        Comments   1 day PO OD sx 03/07/2021 for vitrectomy, membrane peel, inj SF6 20% right eye for macular hole. Pt states she does not notice any difference from before surgery, she says she slightly noticed a bubble in her vision, but looking down at her hands she can see clear. Denies new flashes of light or floaters. Compliant with positioning last night       Last edited by Hurman Horn, MD on 03/08/2021  9:46 AM.      Referring physician: No referring provider defined for this encounter.  HISTORICAL INFORMATION:   Selected notes from the MEDICAL RECORD NUMBER       CURRENT MEDICATIONS: No current outpatient medications on file. (Ophthalmic Drugs)   No current facility-administered medications for this visit. (Ophthalmic Drugs)   Current Outpatient Medications (Other)  Medication Sig   ALPRAZolam (XANAX) 0.25 MG tablet Take 1 tablet (0.25 mg total) by mouth daily as needed for anxiety.   Cholecalciferol (VITAMIN D3 PO) Take by mouth daily.   diphenoxylate-atropine (LOMOTIL) 2.5-0.025 MG tablet Take 2 tablets by mouth 4 (four) times daily as needed for diarrhea or loose stools.   docusate sodium (COLACE) 100 MG capsule 1 capsule as needed   lidocaine-prilocaine (EMLA) cream Apply 1 application topically as needed.   NORVASC 2.5 MG tablet TAKE 1 TABLET BY MOUTH EVERY DAY   ondansetron (ZOFRAN) 8 MG tablet Take 1 tablet (8 mg total) by mouth every 8 (eight) hours as needed for nausea or vomiting.   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 Some residual intraretinal fluid remains, very small localized subfoveal of fluid remains although this continues to slowly resorb. We'll continue to observe.   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   Resolved March 2021 post vitrectomy   Past Surgical History:  Procedure Laterality Date   COLONOSCOPY  2018  [facility]   COLONSCOPY  12/2015   IR IMAGING GUIDED PORT INSERTION  08/04/2020   LAPAROSCOPIC RIGHT HEMI COLECTOMY Right 02/07/2016   Procedure: LAPAROSCOPIC ASSISTED RIGHT HEMI COLECTOMY AND RIGHT SALPINGO OOPHERECTOMY;  Surgeon: [name], MD;  Location: [facility];  Service: General;  Laterality: Right;   RETINAL DETACHMENT SURGERY  09/2019    FAMILY HISTORY Family History  Problem Relation Age of Onset   Cancer Mother        lung cancer   Cancer Maternal Grandfather        unknown cancer    Colon cancer Neg Hx    Esophageal cancer Neg Hx    Rectal cancer Neg Hx    Stomach cancer Neg Hx    Colon polyps Neg Hx     SOCIAL  HISTORY Social History   Tobacco Use   Smoking status: Never   Smokeless tobacco: Never  Vaping Use   Vaping Use: Never used  Substance Use Topics   Alcohol use: No    Alcohol/week: 0.0 standard drinks   Drug use: No         OPHTHALMIC EXAM:  Base Eye Exam     Visual Acuity (ETDRS)       Right Left   Dist [place] CF at 1' 20/30   Dist ph  NI          Tonometry (Tonopen, 9:35 AM)       Right Left   Pressure 10 14         Pupils       Dark Light Shape React APD   Right Pharma. Dilated    None   Left 4 3 Round Brisk None         Extraocular Movement       Right Left    Full Full         Neuro/Psych     Oriented x3: Yes   Mood/Affect: Normal         Dilation     Right eye: 1.0% Mydriacyl, 2.5% Phenylephrine @ 9:35 AM           Slit Lamp and Fundus Exam     External Exam       Right Left   External Normal Normal         Slit Lamp Exam       Right Left   Lids/Lashes Normal Normal   Conjunctiva/Sclera White and quiet White and quiet   Cornea Clear Clear   Anterior Chamber Deep and quiet Deep and quiet   Iris Round and reactive Round and reactive   Lens Posterior chamber intraocular lens 2+ Nuclear sclerosis   Anterior Vitreous Normal Normal         Fundus Exam       Right Left   Posterior Vitreous Vitrectomized, 90% gas    Disc Normal    C/D Ratio 0.2    Macula All appears closed through the gas    Vessels Normal    Periphery Normal             IMAGING AND PROCEDURES  Imaging and Procedures for 03/08/21           ASSESSMENT/PLAN:  Macular hole of right eye Postop day #1 looks great, hole appears closed with a gas     ICD-10-CM   1. Macular hole of right eye  H35.341       1.  OD looks great today after developing macular hole, findings  at surgery included that of a minor epiretinal membrane as well as subjacent internal limiting membrane traction.  Each removed without   complication  2.  3.  Ophthalmic Meds Ordered this visit:  No orders of the defined types were placed in this encounter.      Return in about 1 week (around 03/15/2021) for POST OP, dilate, OD, OCT.  Patient Instructions  Ofloxacin  4 times daily to the operative eye  Prednisolone acetate 1 drop to the operative eye 4 times daily  Patient instructed not to refill the medications and use them for maximum of 3 weeks.  Patient instructed do not rub the eye.  Patient has the option to use the patch at night.   Macular hole surgery was explained with the need for a gas injection. The patient is aware of proper face down position (like looking downward naturally while  reading a book in a lap) for 3-5 days. Patient was advised to not lay on their back while sleeping or resting, usually for 2 weeks. Do not travel to places of elevation while gas bubble is in the eye, usually for 2 weeks    Explained the diagnoses, plan, and follow up with the patient and they expressed understanding.  Patient expressed understanding of the importance of proper follow up care.   Clent Demark Vineet Kinney M.D. Diseases & Surgery of the Retina and Vitreous [facility] 03/08/21     Abbreviations: M myopia (nearsighted); A astigmatism; H hyperopia (farsighted); P presbyopia; Mrx spectacle prescription;  CTL contact lenses; OD right eye; OS left eye; OU both eyes  XT exotropia; ET esotropia; PEK punctate epithelial keratitis; PEE punctate epithelial erosions; DES dry eye syndrome; MGD meibomian gland dysfunction; ATs artificial tears; PFAT's preservative free artificial tears; [place] nuclear sclerotic cataract; PSC posterior subcapsular cataract; ERM epi-retinal membrane; PVD posterior vitreous detachment; RD retinal detachment; DM diabetes mellitus; DR diabetic retinopathy; NPDR non-proliferative diabetic retinopathy; PDR proliferative diabetic retinopathy; CSME clinically significant macular edema; DME  diabetic macular edema; dbh dot blot hemorrhages; CWS cotton wool spot; POAG primary open angle glaucoma; C/D cup-to-disc ratio; HVF humphrey visual field; GVF goldmann visual field; OCT optical coherence tomography; IOP intraocular pressure; BRVO Branch retinal vein occlusion; CRVO central retinal vein occlusion; CRAO central retinal artery occlusion; BRAO branch retinal artery occlusion; RT retinal tear; SB scleral buckle; PPV pars plana vitrectomy; VH Vitreous hemorrhage; PRP panretinal laser photocoagulation; IVK intravitreal kenalog; VMT vitreomacular traction; MH Macular hole;  NVD neovascularization of the disc; NVE neovascularization elsewhere; AREDS age related eye disease study; ARMD age related macular degeneration; POAG primary open angle glaucoma; EBMD epithelial/anterior basement membrane dystrophy; ACIOL anterior chamber intraocular lens; IOL intraocular lens; PCIOL posterior chamber intraocular lens; Phaco/IOL phacoemulsification with intraocular lens placement; [place] photorefractive keratectomy; LASIK laser assisted in situ keratomileusis; HTN hypertension; DM diabetes mellitus; COPD chronic obstructive pulmonary disease

## 2021-03-08 NOTE — Patient Instructions (Signed)
Ofloxacin  4 times daily to the operative eye  Prednisolone acetate 1 drop to the operative eye 4 times daily  Patient instructed not to refill the medications and use them for maximum of 3 weeks.  Patient instructed do not rub the eye.  Patient has the option to use the patch at night.   Macular hole surgery was explained with the need for a gas injection. The patient is aware of proper face down position (like looking downward naturally while  reading a book in a lap) for 3-5 days. Patient was advised to not lay on their back while sleeping or resting, usually for 2 weeks. Do not travel to places of elevation while gas bubble is in the eye, usually for 2 weeks

## 2021-03-08 NOTE — Assessment & Plan Note (Signed)
Postop day #1 looks great, hole appears closed with a gas

## 2021-03-13 MED FILL — Dexamethasone Sodium Phosphate Inj 100 MG/10ML: INTRAMUSCULAR | Qty: 1 | Status: AC

## 2021-03-14 ENCOUNTER — Ambulatory Visit: Payer: Federal, State, Local not specified - PPO | Admitting: Hematology

## 2021-03-14 ENCOUNTER — Other Ambulatory Visit: Payer: Federal, State, Local not specified - PPO

## 2021-03-14 ENCOUNTER — Other Ambulatory Visit: Payer: Self-pay

## 2021-03-14 ENCOUNTER — Encounter: Payer: Self-pay | Admitting: Hematology

## 2021-03-14 ENCOUNTER — Inpatient Hospital Stay: Payer: Federal, State, Local not specified - PPO

## 2021-03-14 ENCOUNTER — Inpatient Hospital Stay (HOSPITAL_BASED_OUTPATIENT_CLINIC_OR_DEPARTMENT_OTHER): Payer: Federal, State, Local not specified - PPO | Admitting: Hematology

## 2021-03-14 VITALS — BP 131/73 | HR 66 | Temp 98.2°F | Resp 16 | Wt 95.2 lb

## 2021-03-14 DIAGNOSIS — Z95828 Presence of other vascular implants and grafts: Secondary | ICD-10-CM

## 2021-03-14 DIAGNOSIS — Z5112 Encounter for antineoplastic immunotherapy: Secondary | ICD-10-CM | POA: Diagnosis not present

## 2021-03-14 DIAGNOSIS — C182 Malignant neoplasm of ascending colon: Secondary | ICD-10-CM

## 2021-03-14 DIAGNOSIS — C787 Secondary malignant neoplasm of liver and intrahepatic bile duct: Secondary | ICD-10-CM

## 2021-03-14 DIAGNOSIS — C20 Malignant neoplasm of rectum: Secondary | ICD-10-CM

## 2021-03-14 DIAGNOSIS — I10 Essential (primary) hypertension: Secondary | ICD-10-CM

## 2021-03-14 LAB — CMP (CANCER CENTER ONLY)
ALT: 7 U/L (ref 0–44)
AST: 15 U/L (ref 15–41)
Albumin: 3.6 g/dL (ref 3.5–5.0)
Alkaline Phosphatase: 75 U/L (ref 38–126)
Anion gap: 6 (ref 5–15)
BUN: 14 mg/dL (ref 8–23)
CO2: 26 mmol/L (ref 22–32)
Calcium: 9 mg/dL (ref 8.9–10.3)
Chloride: 106 mmol/L (ref 98–111)
Creatinine: 0.75 mg/dL (ref 0.44–1.00)
GFR, Estimated: >60 mL/min (ref >60)
Glucose, Bld: 95 mg/dL (ref 70–99)
Potassium: 4 mmol/L (ref 3.5–5.1)
Sodium: 138 mmol/L (ref 135–145)
Total Bilirubin: 0.3 mg/dL (ref 0.3–1.2)
Total Protein: 6.8 g/dL (ref 6.5–8.1)

## 2021-03-14 LAB — CBC WITH DIFFERENTIAL (CANCER CENTER ONLY)
Abs Immature Granulocytes: 0.03 10*3/uL (ref 0.00–0.07)
Basophils Absolute: 0 10*3/uL (ref 0.0–0.1)
Basophils Relative: 0 %
Eosinophils Absolute: 0.1 10*3/uL (ref 0.0–0.5)
Eosinophils Relative: 1 %
HCT: 32.8 % — ABNORMAL LOW (ref 36.0–46.0)
Hemoglobin: 10 g/dL — ABNORMAL LOW (ref 12.0–15.0)
Immature Granulocytes: 1 %
Lymphocytes Relative: 18 %
Lymphs Abs: 1.2 10*3/uL (ref 0.7–4.0)
MCH: 26.5 pg (ref 26.0–34.0)
MCHC: 30.5 g/dL (ref 30.0–36.0)
MCV: 86.8 fL (ref 80.0–100.0)
Monocytes Absolute: 0.6 10*3/uL (ref 0.1–1.0)
Monocytes Relative: 10 %
Neutro Abs: 4.4 10*3/uL (ref 1.7–7.7)
Neutrophils Relative %: 70 %
Platelet Count: 195 10*3/uL (ref 150–400)
RBC: 3.78 MIL/uL — ABNORMAL LOW (ref 3.87–5.11)
RDW: 15.4 % (ref 11.5–15.5)
WBC Count: 6.3 10*3/uL (ref 4.0–10.5)
nRBC: 0 % (ref 0.0–0.2)

## 2021-03-14 LAB — TOTAL PROTEIN, URINE DIPSTICK: Protein, ur: NEGATIVE mg/dL

## 2021-03-14 MED ORDER — SODIUM CHLORIDE 0.9% FLUSH: 10.0000 mL | Freq: Once | INTRAVENOUS | Status: DC

## 2021-03-14 MED ORDER — SODIUM CHLORIDE 0.9 % IV SOLN
200.0000 mg | Freq: Once | INTRAVENOUS | Status: AC
  Administered 2021-03-14: 200 mg via INTRAVENOUS
  Filled 2021-03-14: qty 8

## 2021-03-14 MED ORDER — SODIUM CHLORIDE 0.9 % IV SOLN: Freq: Once | INTRAVENOUS | Status: AC

## 2021-03-14 MED ORDER — SODIUM CHLORIDE 0.9 % IV SOLN
10.0000 mg | Freq: Once | INTRAVENOUS | Status: AC
  Administered 2021-03-14: 10 mg via INTRAVENOUS
  Filled 2021-03-14: qty 10

## 2021-03-14 MED ORDER — ATROPINE SULFATE 1 MG/ML IJ SOLN
0.5000 mg | Freq: Once | INTRAMUSCULAR | Status: AC | PRN
  Administered 2021-03-14: 0.5 mg via INTRAVENOUS
  Filled 2021-03-14: qty 1

## 2021-03-14 MED ORDER — SODIUM CHLORIDE 0.9% FLUSH
10.0000 mL | INTRAVENOUS | Status: DC | PRN
  Administered 2021-03-14: 10 mL

## 2021-03-14 MED ORDER — PALONOSETRON HCL INJECTION 0.25 MG/5ML
0.2500 mg | Freq: Once | INTRAVENOUS | Status: AC
  Administered 2021-03-14: 0.25 mg via INTRAVENOUS
  Filled 2021-03-14: qty 5

## 2021-03-14 MED ORDER — SODIUM CHLORIDE 0.9 % IV SOLN
130.0000 mg/m2 | Freq: Once | INTRAVENOUS | Status: AC
  Administered 2021-03-14: 180 mg via INTRAVENOUS
  Filled 2021-03-14: qty 9

## 2021-03-14 MED ORDER — HEPARIN SOD (PORK) LOCK FLUSH 100 UNIT/ML IV SOLN
500.0000 [IU] | Freq: Once | INTRAVENOUS | Status: AC | PRN
  Administered 2021-03-14: 500 [IU]

## 2021-03-14 NOTE — Progress Notes (Signed)
Brittney Spencer   Telephone:([phone]) [phone] Fax:(336) [phone]   Clinic Follow up Note   Patient Care Team: Patient, No Pcp Per (Inactive) as PCP - General (General Practice) [name], NP as Nurse Practitioner (Oncology) [name], MD as Consulting Physician (Oncology) [name], RN (Inactive) as Oncology Nurse Navigator  Date of Service:  03/14/2021  CHIEF COMPLAINT: f/u of metastatic rectal cancer, h/o colon cancer  CURRENT THERAPY:  First-line FOLFIRI q2weeks starting 08/10/20. dose reduced with cycle 1. Bevacizumab added with cycle 2 on 08/23/2020  ASSESSMENT & PLAN:  Brittney Spencer is a 70 y.o. female with   1. Rectal adenocarcinoma, with liver and right pelvic metastasis, recurrence from previous colon cancer vs new primary, MSS, KRAS mutation (+)  -Diagnosed on routine screening colonoscopy 05/2020, rectal mass biopsy showed invasive adenocarcinoma, arising from a tubular adenoma -Her 07/12/20 PET showed positive uptake of known rectal tumor and soft tissue mass within the right iliac fossa indicating recurrent prior colon cancer. There is also left lobe of liver lesion is FDG avid concerning for liver metastasis. pt declined liver biopsy -She began first-line systemic chemo with dose-reduced FOLFIRI on 08/06/20. Bevacizumab added, and irinotecan/5FU increased with cycle 2, but due to poor tolerance/diarrhea irinotecan was dose reduced with cycle 3 and she has been tolerating well. -CT CAP 01/22/21 showed continued good response to treatment, her liver met has further decreased in size.  I discussed the indeterminate left upper lobe groundglass lung nodule, will continue monitoring. -FO was obtained on her rectal tumor biopsy showing MSI stable disease, tumor burden low, KRAS mutation (+), no other targetable mutation.  -Due to patient preference, she was changed to irinotecan and bevacizumab maintenance therapy. -Her last tumor marker on 02/28/21 was back in  the normal range at 4.41. No need to add 5FU at this time. -labs reviewed, overall adequate for treatment today. -plan for restaging CT in late 03/2021.   2. Cancer of ascending colon, pT4bN1bM0, stage IIIC, MSI-stable, (+) surgical margins at pelvic wall     -She was diagnosed in 12/2015. She is s/p right hemicolectomy with right salpingo oophorectomy and adjuvant ChemoRT. -she declined adjuvant chemo due to the concern of side effects and impact on her quality of life. -Her 04/2019 CT scan was NED  -With recent rectal cancer diagnosis, her PET from 07/12/20 indicates soft tissue mass within the right iliac fossa is noted and consistent with local tumor recurrence from previous ascending colon tumor.   3. Mild Anemia -Has been stable and mild for the past year.  -worse lately due to her newly diagnosed rectal cancer and starting chemo. Denies overt GI bleeding. -Moderate and stable lately. Hgb 9.3 today (02/28/21)   4. HTN, Anxiety -She'll follow-up with her primary care physician and continue medication -She uses Xanax as needed. She is stable.  -She continues to cope with her diagnosis and treatment.    5. Hypokalemia -resolved with increased potassium in diet   6. 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 -I discussed that germline ATM mutation increased risk of breast cancer, and I recommend her to have genetic testing.  She will think about it and let me know.      Plan:  -proceed with C7 chemo with irinotecan and bevacizumab today -labs, f/u, and C8 in 2 weeks.    No problem-specific Assessment & Plan notes found for this encounter.   SUMMARY OF ONCOLOGIC HISTORY:  Oncology History Overview Note  Cancer Staging Cancer of ascending colon [facility]) Staging form: Colon and Rectum, AJCC 7th Edition - Clinical stage from 02/07/2016: Stage IIIC (T4b, N1b, M0) - Signed by [name], MD on 03/04/2016 Laterality: Right Residual  tumor (R): R2 - Macroscopic    Cancer of ascending colon ([place])  10/25/2015 Imaging   CT ABD/PELVIS:  Inflammatory changes inferior to the cecal tip appear improved, there is still irregular soft tissue thickening of the cecal tip, and there are adjacent prominent lymph nodes in the ileocolonic mesentery, measuring 13 mm on image 49 and 8 mm on image 52. In addition, there is a 2.5 x 1.8 cm nodule on image 46 which has central low density. Therefore, these findings are moderately suspicious for an underlying cecal malignancy with perforation.    01/19/2016 Procedure   COLONOSCOPY per Dr. [name]: Fungating, ulcerated mass almost obstructing mid ascending colon   01/19/2016 Initial Biopsy   Diagnosis Surgical [P], cecal mass - INVASIVE ADENOCARCINOMA WITH ULCERATION. - SEE COMMENT.   02/05/2016 Tumor Marker   Patient's tumor was tested for the following markers: CEA Results of the tumor marker test revealed 5.7.   02/07/2016 Initial Diagnosis   Cancer of ascending colon ([place])   02/07/2016 Definitive Surgery   Laparoscopic assisted right hemicolectomy and right salpingo oopherectomy--Dr. [name]   02/07/2016 Pathologic Stage   p T4 N1b   2/43 nodes +   02/07/2016 Pathology Results   MMR normal; G2 adenocarcinoma;proximal & distal margins negative; soft tissue mass on pelvic sidewall + for adenocarcinoma with positive margin MSI Stable   03/08/2016 Imaging   CT chest negative for metastasis.    03/19/2016 - 04/25/2016 Radiation Therapy   Adjuvant irradiation, 50 gray in 28 fractions   03/19/2016 - 04/22/2016 Chemotherapy   Xeloda 1500 mg twice daily, started on 03/19/2016, dose reduced to 1000 mg twice daily from week 3 due to neutropenia, and patient stopped 3 days before last dose radiation due to difficulty swallowing the pill    05/20/2016 -  Adjuvant Chemotherapy   Patient declined adjuvant chemotherapy   09/16/2016 Imaging   CT CAP w Contrast 1. No evidence of local tumor  recurrence at the ileocolic anastomosis. 2. No findings suspicious for metastatic disease in the chest, abdomen or pelvis. 3. Nonspecific trace free fluid in the pelvic cul-de-sac. 4. Stable solitary 3 mm right upper lobe pulmonary nodule, for which 6 month stability has been demonstrated, probably benign. 5. Additional findings include stable right posterior pericardial cyst and small calcified uterine fibroids.   05/13/2017 Imaging   CT CAP W Contrast 05/13/17 IMPRESSION: 1. No current findings of residual or recurrent malignancy. 2. Mild prominence of stool throughout the colon. Nondistended portions of the rectum. 3. Several tiny pulmonary nodules are stable from the earliest available comparison of 03/08/2016 and probably benign, but may merit surveillance. 4. Other imaging findings of potential clinical significance: Old granulomatous disease. Aortoiliac atherosclerotic vascular disease. Lumbar spondylosis and degenerative disc disease. Stable amount of trace free pelvic fluid.   04/27/2018 Imaging   04/27/2018 CT CAP IMPRESSION: Stable exam. No evidence of recurrent or metastatic carcinoma within the chest, abdomen, or pelvis   04/19/2019 Imaging   CT CAP W Contrast  IMPRESSION: Chest Impression:   1. No evidence of thoracic metastasis. 2. Stable small bilateral pulmonary nodules.   Abdomen / Pelvis Impression:   1. No evidence local colorectal carcinoma recurrence or metastasis in the abdomen pelvis. 2. Post RIGHT hemicolectomy.   01/22/2021  Imaging   CT CAP  IMPRESSION: CT CHEST IMPRESSION   1. Similar nonspecific pulmonary nodules. 2. New posterior left upper lobe reticulonodular opacity, suspicious for interval mild infection or inflammation. 3. No thoracic adenopathy.   CT ABDOMEN AND PELVIS IMPRESSION   1. Further decrease in size of high left hepatic lobe 3 mm low-density lesion. No new or progressive metastatic disease within the abdomen or  pelvis. 2. Similar trace free pelvic fluid. 3. Similar nonspecific mid rectal wall thickening. 4.  Aortic Atherosclerosis (ICD10-I70.0).   Rectal cancer metastasized to liver ([place])  06/15/2020 Procedure   Screening Colonoscopy by Dr [name]  IMPRESSION - Decreased sphincter tone and internal hemorrhoids that prolapse with straining, but require manual replacement into the anal canal (Grade III) found on digital rectal exam. - Patent side-to-side ileo-colonic anastomosis, characterized by healthy appearing mucosa. - The examined portion of the ileum was normal. - One diminutive polyp in the proximal transverse colon, removed with a cold biopsy forceps. Resected and retrieved. - Likely malignant partially obstructing tumor in the mid rectum. Biopsied. Tattooed. - The examination was otherwise normal on direct and retroflexion views.   06/15/2020 Initial Biopsy   Diagnosis 1. Transverse Colon Polyp - HYPERPLASTIC POLYP 2. Rectum, biopsy - ADENOCARCINOMA ARISING IN A TUBULAR ADENOMA WITH HIGH-GRADE DYSPLASIA. SEE NOTE Diagnosis Note 2. Dr. [name] reviewed the case and concurs with the diagnosis. Dr. [name] was notified on 06/16/2020.   06/28/2020 Imaging   CT CAP  IMPRESSION: 1. New low-density focus in the anterior aspect of the lateral segment LEFT hepatic lobe measuring 1.2 x 1.0 cm, compatible with small metastatic lesion in the LEFT hepatic lobe. 2. Soft tissue in the RIGHT iliac fossa following RIGHT hemicolectomy invades the psoas musculature and is slowly enlarging over time, more linear on the prior study now highly concerning for recurrence/metastasis to this location. 3. Signs of enteritis, potentially post radiation changes of the small bowel. Tethered small bowel in the RIGHT lower quadrant shows focal thickening and narrowing suspicious for small bowel involvement and developing partial obstruction though currently contrast passes beyond this point into the colon. 4.  Rectal thickening in this patient with known rectal mass as described. 5. No evidence of metastatic disease in the chest. 6. Stable small pulmonary nodules. 7.  and aortic atherosclerosis.   Aortic Atherosclerosis (ICD10-I70.0) and Emphysema (ICD10-J43.9).   07/04/2020 Initial Diagnosis   Rectal cancer metastasized to liver ([place])   07/12/2020 PET scan   IMPRESSION: 1. Exam positive for FDG avid rectal tumor which corresponds to the recent colonoscopy findings. 2. FDG avid soft tissue mass within the right iliac fossa is noted and consistent with local tumor recurrence from previous ascending colon tumor. 3. Lateral segment left lobe of liver lesion is FDG avid concerning for liver metastasis. 4. No specific findings identified to suggest metastatic disease to the chest.   08/10/2020 -  Chemotherapy   First-line FOLFIRI q2weeks starting 08/10/20. dose reduced with cycle 1. Irinotecan/5FU increased and Bevacizumab added with cycle 2 on 08/23/2020    10/27/2020 Imaging   CT CAP  IMPRESSION: 1. Interval decrease in size of the hypermetabolic left hepatic lesion, consistent with metastatic disease. No new liver lesion evident. 2. Interval resolution of the hypermetabolic soft tissue lesion along the right iliac fossa with no measurable soft tissue lesion remaining at this location today. 3. Similar appearance of soft tissue fullness in the rectum at the site of the hypermetabolic lesion seen previously. 4. Stable tiny bilateral pulmonary nodules. Continued  attention on follow-up recommended. 5. Small volume free fluid in the pelvis. 6. Aortic Atherosclerosis (ICD10-I70.0).   01/22/2021 Imaging   CT CAP  IMPRESSION: CT CHEST IMPRESSION   1. Similar nonspecific pulmonary nodules. 2. New posterior left upper lobe reticulonodular opacity, suspicious for interval mild infection or inflammation. 3. No thoracic adenopathy.   CT ABDOMEN AND PELVIS IMPRESSION   1. Further decrease  in size of high left hepatic lobe 3 mm low-density lesion. No new or progressive metastatic disease within the abdomen or pelvis. 2. Similar trace free pelvic fluid. 3. Similar nonspecific mid rectal wall thickening. 4.  Aortic Atherosclerosis (ICD10-I70.0).      INTERVAL HISTORY:  Brittney Spencer is here for a follow up of metastatic rectal cancer. She was last seen by me on 02/28/21. She was seen in the infusion area. She is recovering from right eye procedure. She notes her appetite is good. She is staying hydrated and has added some more fiber to her diet.   All other systems were reviewed with the patient and are negative.  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 Some residual intraretinal fluid remains, very small localized subfoveal of fluid remains although this continues to slowly resorb. We'll continue to observe.   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   Resolved March 2021 post vitrectomy    SURGICAL HISTORY: Past Surgical History:  Procedure Laterality Date   COLONOSCOPY  2018   [facility]   COLONSCOPY  12/2015   IR IMAGING GUIDED PORT INSERTION  08/04/2020   LAPAROSCOPIC RIGHT HEMI COLECTOMY Right 02/07/2016   Procedure: LAPAROSCOPIC ASSISTED RIGHT HEMI COLECTOMY AND RIGHT SALPINGO OOPHERECTOMY;  Surgeon: [name], MD;  Location: [facility];  Service: General;  Laterality: Right;    RETINAL DETACHMENT SURGERY  09/2019    I have reviewed the social history and family history with the patient and they are unchanged from previous note.  ALLERGIES:  is allergic to fish allergy, peanut-containing drug products, soy allergy, and buspirone.  MEDICATIONS:  Current Outpatient Medications  Medication Sig Dispense Refill   ALPRAZolam (XANAX) 0.25 MG tablet Take 1 tablet (0.25 mg total) by mouth daily as needed for anxiety. 30 tablet 0   Cholecalciferol (VITAMIN D3 PO) Take by mouth daily.     diphenoxylate-atropine (LOMOTIL) 2.5-0.025 MG tablet Take 2 tablets by mouth 4 (four) times daily as needed for diarrhea or loose stools. 45 tablet 3   docusate sodium (COLACE) 100 MG capsule 1 capsule as needed     lidocaine-prilocaine (EMLA) cream Apply 1 application topically as needed. 30 g 1   NORVASC 2.5 MG tablet TAKE 1 TABLET BY MOUTH EVERY DAY 90 tablet 1   ondansetron (ZOFRAN) 8 MG tablet Take 1 tablet (8 mg total) by mouth every 8 (eight) hours as needed for nausea or vomiting.  20 tablet 2   Current Facility-Administered Medications  Medication Dose Route Frequency Provider Last Rate Last Admin   0.9 %  sodium chloride infusion  500 mL Intravenous Once Doran Stabler, MD       Facility-Administered Medications Ordered in Other Visits  Medication Dose Route Frequency Provider Last Rate Last Admin   0.9 %  sodium chloride infusion   Intravenous Once [name], MD       atropine injection 0.5 mg  0.5 mg Intravenous Once PRN [name], MD       bevacizumab-bvzr (ZIRABEV) 200 mg in sodium chloride 0.9 % 100 mL chemo infusion  200 mg Intravenous Once [name], MD       dexamethasone (DECADRON) 10 mg in sodium chloride 0.9 % 50 mL IVPB  10 mg Intravenous Once [name], MD       heparin lock flush 100 unit/mL  500 Units Intracatheter Once PRN [name], MD       irinotecan (CAMPTOSAR) 180 mg in sodium chloride 0.9 % 500 mL chemo infusion  130 mg/m2 (Order-Specific) Intravenous  Once [name], MD       palonosetron (ALOXI) injection 0.25 mg  0.25 mg Intravenous Once [name], MD       sodium chloride flush (NS) 0.9 % injection 10 mL  10 mL Intracatheter PRN [name], MD        PHYSICAL EXAMINATION: ECOG PERFORMANCE STATUS: 1 - Symptomatic but completely ambulatory  There were no vitals filed for this visit. Wt Readings from Last 3 Encounters:  03/14/21 95 lb 4 oz (43.2 kg)  02/28/21 98 lb 2 oz (44.5 kg)  02/14/21 99 lb 3.2 oz (45 kg)     GENERAL:alert, no distress and comfortable SKIN: skin color normal, no rashes or significant lesions EYES: normal, Conjunctiva are pink and non-injected, sclera clear  NEURO: alert & oriented x 3 with fluent speech  LABORATORY DATA:  I have reviewed the data as listed CBC Latest Ref Rng & Units 03/14/2021 02/28/2021 02/14/2021  WBC 4.0 - 10.5 K/uL 6.3 2.4(L) 2.9(L)  Hemoglobin 12.0 - 15.0 g/dL 10.0(L) 9.3(L) 9.1(L)  Hematocrit 36.0 - 46.0 % 32.8(L) 30.3(L) 29.0(L)  Platelets 150 - 400 K/uL 195 156 203     CMP Latest Ref Rng & Units 03/14/2021 02/28/2021 02/14/2021  Glucose 70 - 99 mg/dL 95 102(H) 120(H)  BUN 8 - 23 mg/dL _0 Creatinine 0.44 - 1.00 mg/dL 0.75 0.79 0.79  Sodium 135 - 145 mmol/L 138 140 141  Potassium 3.5 - 5.1 mmol/L 4.0 3.8 3.7  Chloride 98 - 111 mmol/L 106 107 108  CO2 22 - 32 mmol/L _1 Calcium 8.9 - 10.3 mg/dL 9.0 8.9 9.0  Total Protein 6.5 - 8.1 g/dL 6.8 6.5 6.6  Total Bilirubin 0.3 - 1.2 mg/dL 0.3 0.3 0.3  Alkaline Phos 38 - 126 U/L 75 59 63  AST 15 - 41 U/L _2 ALT 0 - 44 U/L _3 RADIOGRAPHIC STUDIES: I have personally reviewed the radiological images as listed and agreed with the findings in the report. No results found.    No orders of the defined types were placed in this encounter.  All questions were answered. The patient knows to call the clinic with any problems, questions or concerns. No barriers to learning was detected. The total time spent in the  appointment was 30 minutes.     Krista Blue  Burr Medico, MD 03/14/2021   I, Wilburn Mylar, am acting as scribe for [name], MD.   I have reviewed the above documentation for accuracy and completeness, and I agree with the above.

## 2021-03-14 NOTE — Patient Instructions (Signed)
[facility]  Discharge Instructions: Thank you for choosing [facility] to provide your oncology and hematology care.   If you have a lab appointment with the [facility], please go directly to the [facility] and check in at the registration area.   Wear comfortable clothing and clothing appropriate for easy access to any Portacath or PICC line.   We strive to give you quality time with your provider. You may need to reschedule your appointment if you arrive late (15 or more minutes).  Arriving late affects you and other patients whose appointments are after yours.  Also, if you miss three or more appointments without notifying the office, you may be dismissed from the clinic at the provider's discretion.      For prescription refill requests, have your pharmacy contact our office and allow 72 hours for refills to be completed.    Today you received the following chemotherapy and/or immunotherapy agents Irinotecan and Avastin      To help prevent nausea and vomiting after your treatment, we encourage you to take your nausea medication as directed.  BELOW ARE SYMPTOMS THAT SHOULD BE REPORTED IMMEDIATELY: *FEVER GREATER THAN 100.4 F (38 C) OR HIGHER *CHILLS OR SWEATING *NAUSEA AND VOMITING THAT IS NOT CONTROLLED WITH YOUR NAUSEA MEDICATION *UNUSUAL SHORTNESS OF BREATH *UNUSUAL BRUISING OR BLEEDING *URINARY PROBLEMS (pain or burning when urinating, or frequent urination) *BOWEL PROBLEMS (unusual diarrhea, constipation, pain near the anus) TENDERNESS IN MOUTH AND THROAT WITH OR WITHOUT PRESENCE OF ULCERS (sore throat, sores in mouth, or a toothache) UNUSUAL RASH, SWELLING OR PAIN  UNUSUAL VAGINAL DISCHARGE OR ITCHING   Items with * indicate a potential emergency and should be followed up as soon as possible or go to the Emergency Department if any problems should occur.  Please show the CHEMOTHERAPY ALERT CARD or IMMUNOTHERAPY ALERT CARD at  check-in to the Emergency Department and triage nurse.  Should you have questions after your visit or need to cancel or reschedule your appointment, please contact [facility]  Dept: [phone]  and follow the prompts.  Office hours are 8:00 a.m. to 4:30 p.m. Monday - Friday. Please note that voicemails left after 4:00 p.m. may not be returned until the following business day.  We are closed weekends and major holidays. You have access to a nurse at all times for urgent questions. Please call the main number to the clinic Dept: [phone] and follow the prompts.   For any non-urgent questions, you may also contact your provider using MyChart. We now offer e-Visits for anyone 72 and older to request care online for non-urgent symptoms. For details visit [email or web address].   Also download the MyChart app! Go to the app store, search "MyChart", open the app, select [facility], and log in with your MyChart username and password.  Due to Covid, a mask is required upon entering the hospital/clinic. If you do not have a mask, one will be given to you upon arrival. For doctor visits, patients may have 1 support person aged 72 or older with them. For treatment visits, patients cannot have anyone with them due to current Covid guidelines and our immunocompromised population.

## 2021-03-15 ENCOUNTER — Telehealth: Payer: Self-pay | Admitting: Hematology

## 2021-03-15 ENCOUNTER — Encounter (INDEPENDENT_AMBULATORY_CARE_PROVIDER_SITE_OTHER): Payer: Self-pay | Admitting: Ophthalmology

## 2021-03-15 ENCOUNTER — Inpatient Hospital Stay: Payer: Federal, State, Local not specified - PPO

## 2021-03-15 ENCOUNTER — Ambulatory Visit (INDEPENDENT_AMBULATORY_CARE_PROVIDER_SITE_OTHER): Payer: Federal, State, Local not specified - PPO | Admitting: Ophthalmology

## 2021-03-15 DIAGNOSIS — H35341 Macular cyst, hole, or pseudohole, right eye: Secondary | ICD-10-CM | POA: Diagnosis not present

## 2021-03-15 NOTE — Assessment & Plan Note (Signed)
Ofloxacin  4 times daily to the operative eye  Prednisolone acetate 1 drop to the operative eye 4 times daily  Patient instructed not to refill the medications and use them for maximum of 3 weeks.  Patient instructed do not rub the eye.  Patient has the option to use the patch at night  .Macular hole surgery was explained with the need for a gas injection. The patient is aware of proper face down position (like looking downward naturally while  reading a book in a lap) for 3-5 days. Patient was advised to not lay on their back while sleeping or resting, usually for 2 weeks. Do not travel to places of elevation while gas bubble is in the eye, usually for 2 weeks

## 2021-03-15 NOTE — Progress Notes (Signed)
03/15/2021     CHIEF COMPLAINT Patient presents for  Chief Complaint  Patient presents with   Post-op Follow-up      HISTORY OF PRESENT ILLNESS: Brittney Spencer is a 70 y.o. female who presents to the clinic today for:   HPI     Post-op Follow-up   In right eye.  Discomfort includes foreign body sensation (Pt states "it feels like there is something in it.") and tearing.  Negative for pain, itching and discharge.        Comments   1 week PO OD oct sx 03/07/2021. Pt states she feels like her vision is slightly better. "I am sometimes still experiencing the same tearing but it is better, and sensation of something in my eyes. Denies FOL or floaters, no pain, but a little bit of burning when using the drops.  Pt states "I have a question from the medical team that asked, if the treatment from the cancer will possibly cause slow healing for my eye." Pt is using prednisolone and ofloxacin qid OD, pt has been using them since last week and she is almost out due to sometimes missing a drop in her eye.      Last edited by Laurin Coder on 03/15/2021  9:20 AM.      Referring physician: Nat Christen, MD [place],  [place] [id number]  HISTORICAL INFORMATION:   Selected notes from the MEDICAL RECORD NUMBER       CURRENT MEDICATIONS: No current outpatient medications on file. (Ophthalmic Drugs)   No current facility-administered medications for this visit. (Ophthalmic Drugs)   Current Outpatient Medications (Other)  Medication Sig   ALPRAZolam (XANAX) 0.25 MG tablet Take 1 tablet (0.25 mg total) by mouth daily as needed for anxiety.   Cholecalciferol (VITAMIN D3 PO) Take by mouth daily.   diphenoxylate-atropine (LOMOTIL) 2.5-0.025 MG tablet Take 2 tablets by mouth 4 (four) times daily as needed for diarrhea or loose stools.   docusate sodium (COLACE) 100 MG capsule 1 capsule as needed   lidocaine-prilocaine (EMLA) cream Apply 1 application  topically as needed.   NORVASC 2.5 MG tablet TAKE 1 TABLET BY MOUTH EVERY DAY   ondansetron (ZOFRAN) 8 MG tablet Take 1 tablet (8 mg total) by mouth every 8 (eight) hours as needed for nausea or vomiting.   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 Some residual intraretinal fluid remains, very small  localized subfoveal of fluid remains although this continues to slowly resorb. We'll continue to observe.   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   Resolved March 2021 post vitrectomy   Past Surgical History:  Procedure Laterality Date   COLONOSCOPY  2018   [facility]   COLONSCOPY  12/2015   IR IMAGING GUIDED PORT INSERTION  08/04/2020   LAPAROSCOPIC RIGHT HEMI COLECTOMY Right 02/07/2016   Procedure: LAPAROSCOPIC ASSISTED RIGHT HEMI COLECTOMY AND RIGHT SALPINGO OOPHERECTOMY;  Surgeon: [name], MD;  Location: [facility];  Service: General;  Laterality: Right;   RETINAL DETACHMENT SURGERY  09/2019    FAMILY HISTORY Family History  Problem Relation  Age of Onset   Cancer Mother        lung cancer   Cancer Maternal Grandfather        unknown cancer    Colon cancer Neg Hx    Esophageal cancer Neg Hx    Rectal cancer Neg Hx    Stomach cancer Neg Hx    Colon polyps Neg Hx     SOCIAL HISTORY Social History   Tobacco Use   Smoking status: Never   Smokeless tobacco: Never  Vaping Use   Vaping Use: Never used  Substance Use Topics   Alcohol use: No    Alcohol/week: 0.0 standard drinks   Drug use: No         OPHTHALMIC EXAM:  Base Eye Exam     Visual Acuity (ETDRS)       Right Left   Dist cc 20/400 20/30    Correction: Glasses         Tonometry (Tonopen, 9:15 AM)       Right Left   Pressure 11 19         Pupils       APD   Right None   Left None         Extraocular Movement       Right Left    Full Full         Neuro/Psych     Oriented x3: Yes   Mood/Affect: Normal         Dilation     Right eye: 1.0% Mydriacyl, 2.5% Phenylephrine @ 9:19 AM           Slit Lamp and Fundus Exam     External Exam       Right Left   External Normal Normal         Slit Lamp Exam       Right Left   Lids/Lashes Normal Normal   Conjunctiva/Sclera White and quiet White and quiet   Cornea Clear Clear   Anterior Chamber Deep and quiet Deep and quiet   Iris Round and reactive Round and reactive   Lens Posterior chamber intraocular lens 2+ Nuclear sclerosis   Anterior Vitreous Normal Normal         Fundus Exam       Right Left   Posterior Vitreous Vitrectomized, 45% gas    Disc Normal    C/D Ratio 0.2    Macula Macular hole closed, edges flat ,coapted    Vessels Normal    Periphery Normal             IMAGING AND PROCEDURES  Imaging and Procedures for 03/15/21  OCT, Retina - OU - Both Eyes       Right Eye Quality was good. Scan locations included subfoveal. Central Foveal Thickness:  271. Progression has improved. Findings include abnormal foveal contour.   Left  Eye Quality was good. Central Foveal Thickness: 269. Progression has been stable. Findings include epiretinal membrane, vitreomacular adhesion .   Notes 1 week postop vitrectomy membrane peel for stage IV macular hole with perifoveal CME OD.  Macular hole now closed, 45% gas bubble             ASSESSMENT/PLAN:  Macular hole of right eye Ofloxacin  4 times daily to the operative eye  Prednisolone acetate 1 drop to the operative eye 4 times daily  Patient instructed not to refill the medications and use them for maximum of 3 weeks.  Patient instructed do not rub the eye.  Patient has the option to use the patch at night  .Macular hole surgery was explained with the need for a gas injection. The patient is aware of proper face down position (like looking downward naturally while  reading a book in a lap) for 3-5 days. Patient was advised to not lay on their back while sleeping or resting, usually for 2 weeks. Do not travel to places of elevation while gas bubble is in the eye, usually for 2 weeks      ICD-10-CM   1. Macular hole of right eye  H35.341 OCT, Retina - OU - Both Eyes      1.  Visual acuity vastly improved 1 week post vitrectomy membrane peel, ILM, for macular hole.  2.  Patient to avoid sleeping and lying on back for the next 1 week.  Patient to complete medications over the next 2 weeks and not refill the medication should they complete before  3.  Ophthalmic Meds Ordered this visit:  No orders of the defined types were placed in this encounter.      Return in about 6 weeks (around 04/26/2021) for dilate, OD, POST OP.  Patient Instructions  Patient no longer needs to maintain facedown positioning or looking downwards.  Patient should not sleep or lay on her back for the next 6 days  Ofloxacin  4 times daily to the operative eye  Prednisolone acetate 1 drop to the operative eye 4 times daily  Patient instructed not to refill the medications and use them  for maximum of 3 weeks.  Patient instructed do not rub the eye.  Patient has the option to use the patch at night.   Explained the diagnoses, plan, and follow up with the patient and they expressed understanding.  Patient expressed understanding of the importance of proper follow up care.   Clent Demark Temperence Zenor M.D. Diseases & Surgery of the Retina and Vitreous [facility] 03/15/21     Abbreviations: M myopia (nearsighted); A astigmatism; H hyperopia (farsighted); P presbyopia; Mrx spectacle prescription;  CTL contact lenses; OD right eye; OS left eye; OU both eyes  XT exotropia; ET esotropia; PEK punctate epithelial keratitis; PEE punctate epithelial erosions; DES dry eye syndrome; MGD meibomian gland dysfunction; ATs artificial tears; PFAT's preservative free artificial tears; [place] nuclear sclerotic cataract; PSC posterior subcapsular cataract; ERM epi-retinal membrane; PVD posterior vitreous detachment; RD retinal detachment; DM diabetes mellitus; DR diabetic retinopathy; NPDR non-proliferative diabetic retinopathy; PDR proliferative diabetic retinopathy; CSME clinically significant macular edema; DME diabetic macular edema; dbh dot blot hemorrhages; CWS cotton wool spot; POAG primary open angle glaucoma; C/D cup-to-disc ratio; HVF humphrey visual field; GVF goldmann visual field; OCT optical coherence tomography; IOP intraocular pressure; BRVO Branch retinal vein occlusion; CRVO central retinal vein occlusion; CRAO  central retinal artery occlusion; BRAO branch retinal artery occlusion; RT retinal tear; SB scleral buckle; PPV pars plana vitrectomy; VH Vitreous hemorrhage; PRP panretinal laser photocoagulation; IVK intravitreal kenalog; VMT vitreomacular traction; MH Macular hole;  NVD neovascularization of the disc; NVE neovascularization elsewhere; AREDS age related eye disease study; ARMD age related macular degeneration; POAG primary open angle glaucoma; EBMD epithelial/anterior  basement membrane dystrophy; ACIOL anterior chamber intraocular lens; IOL intraocular lens; PCIOL posterior chamber intraocular lens; Phaco/IOL phacoemulsification with intraocular lens placement; [place] photorefractive keratectomy; LASIK laser assisted in situ keratomileusis; HTN hypertension; DM diabetes mellitus; COPD chronic obstructive pulmonary disease

## 2021-03-15 NOTE — Patient Instructions (Signed)
Patient no longer needs to maintain facedown positioning or looking downwards.  Patient should not sleep or lay on her back for the next 6 days  Ofloxacin  4 times daily to the operative eye  Prednisolone acetate 1 drop to the operative eye 4 times daily  Patient instructed not to refill the medications and use them for maximum of 3 weeks.  Patient instructed do not rub the eye.  Patient has the option to use the patch at night.

## 2021-03-15 NOTE — Telephone Encounter (Signed)
No los 9/14

## 2021-03-19 ENCOUNTER — Telehealth: Payer: Self-pay | Admitting: *Deleted

## 2021-03-19 NOTE — Telephone Encounter (Signed)
Received vm call from pt asking what to do next since she missed her appt 9/15 for injection.  Reviewed chart & noted that it was for Pegfilgrastim.  Discussed with Dr Burr Medico & called pt back & informed to call us with any fever or signs of infection but otherwise will just see her at next scheduled appt 9/28.  She expressed understanding.

## 2021-03-27 MED FILL — Dexamethasone Sodium Phosphate Inj 100 MG/10ML: INTRAMUSCULAR | Qty: 1 | Status: AC

## 2021-03-28 ENCOUNTER — Inpatient Hospital Stay: Payer: Federal, State, Local not specified - PPO

## 2021-03-28 ENCOUNTER — Encounter: Payer: Self-pay | Admitting: Hematology

## 2021-03-28 ENCOUNTER — Inpatient Hospital Stay: Payer: Federal, State, Local not specified - PPO | Admitting: Hematology

## 2021-03-28 ENCOUNTER — Other Ambulatory Visit: Payer: Self-pay

## 2021-03-28 VITALS — BP 133/77 | HR 72 | Temp 97.8°F | Resp 17 | Ht 66.0 in | Wt 99.3 lb

## 2021-03-28 DIAGNOSIS — C20 Malignant neoplasm of rectum: Secondary | ICD-10-CM

## 2021-03-28 DIAGNOSIS — C182 Malignant neoplasm of ascending colon: Secondary | ICD-10-CM | POA: Diagnosis not present

## 2021-03-28 DIAGNOSIS — C787 Secondary malignant neoplasm of liver and intrahepatic bile duct: Secondary | ICD-10-CM

## 2021-03-28 DIAGNOSIS — Z95828 Presence of other vascular implants and grafts: Secondary | ICD-10-CM

## 2021-03-28 DIAGNOSIS — Z5112 Encounter for antineoplastic immunotherapy: Secondary | ICD-10-CM | POA: Diagnosis not present

## 2021-03-28 LAB — CMP (CANCER CENTER ONLY)
ALT: 7 U/L (ref 0–44)
AST: 16 U/L (ref 15–41)
Albumin: 3.4 g/dL — ABNORMAL LOW (ref 3.5–5.0)
Alkaline Phosphatase: 61 U/L (ref 38–126)
Anion gap: 7 (ref 5–15)
BUN: 13 mg/dL (ref 8–23)
CO2: 25 mmol/L (ref 22–32)
Calcium: 9 mg/dL (ref 8.9–10.3)
Chloride: 107 mmol/L (ref 98–111)
Creatinine: 0.75 mg/dL (ref 0.44–1.00)
GFR, Estimated: >60 mL/min (ref >60)
Glucose, Bld: 104 mg/dL — ABNORMAL HIGH (ref 70–99)
Potassium: 4.1 mmol/L (ref 3.5–5.1)
Sodium: 139 mmol/L (ref 135–145)
Total Bilirubin: 0.3 mg/dL (ref 0.3–1.2)
Total Protein: 6.6 g/dL (ref 6.5–8.1)

## 2021-03-28 LAB — CBC WITH DIFFERENTIAL (CANCER CENTER ONLY)
Abs Immature Granulocytes: 0.01 10*3/uL (ref 0.00–0.07)
Basophils Absolute: 0 10*3/uL (ref 0.0–0.1)
Basophils Relative: 0 %
Eosinophils Absolute: 0.1 10*3/uL (ref 0.0–0.5)
Eosinophils Relative: 3 %
HCT: 31.6 % — ABNORMAL LOW (ref 36.0–46.0)
Hemoglobin: 9.7 g/dL — ABNORMAL LOW (ref 12.0–15.0)
Immature Granulocytes: 0 %
Lymphocytes Relative: 39 %
Lymphs Abs: 1.1 10*3/uL (ref 0.7–4.0)
MCH: 26.6 pg (ref 26.0–34.0)
MCHC: 30.7 g/dL (ref 30.0–36.0)
MCV: 86.6 fL (ref 80.0–100.0)
Monocytes Absolute: 0.3 10*3/uL (ref 0.1–1.0)
Monocytes Relative: 11 %
Neutro Abs: 1.3 10*3/uL — ABNORMAL LOW (ref 1.7–7.7)
Neutrophils Relative %: 47 %
Platelet Count: 203 10*3/uL (ref 150–400)
RBC: 3.65 MIL/uL — ABNORMAL LOW (ref 3.87–5.11)
RDW: 15.4 % (ref 11.5–15.5)
WBC Count: 2.8 10*3/uL — ABNORMAL LOW (ref 4.0–10.5)
nRBC: 0 % (ref 0.0–0.2)

## 2021-03-28 LAB — CEA (IN HOUSE-CHCC): CEA (CHCC-In House): 3.89 ng/mL (ref 0.00–5.00)

## 2021-03-28 LAB — TOTAL PROTEIN, URINE DIPSTICK: Protein, ur: NEGATIVE mg/dL

## 2021-03-28 MED ORDER — PALONOSETRON HCL INJECTION 0.25 MG/5ML
0.2500 mg | Freq: Once | INTRAVENOUS | Status: AC
  Administered 2021-03-28: 0.25 mg via INTRAVENOUS
  Filled 2021-03-28: qty 5

## 2021-03-28 MED ORDER — SODIUM CHLORIDE 0.9 % IV SOLN
Freq: Once | INTRAVENOUS | Status: AC
Start: 2021-03-28 — End: 2021-03-28

## 2021-03-28 MED ORDER — HEPARIN SOD (PORK) LOCK FLUSH 100 UNIT/ML IV SOLN
500.0000 [IU] | Freq: Once | INTRAVENOUS | Status: AC | PRN
  Administered 2021-03-28: 500 [IU]

## 2021-03-28 MED ORDER — SODIUM CHLORIDE 0.9 % IV SOLN
130.0000 mg/m2 | Freq: Once | INTRAVENOUS | Status: AC
  Administered 2021-03-28: 180 mg via INTRAVENOUS
  Filled 2021-03-28: qty 9

## 2021-03-28 MED ORDER — SODIUM CHLORIDE 0.9 % IV SOLN
200.0000 mg | Freq: Once | INTRAVENOUS | Status: AC
  Administered 2021-03-28: 200 mg via INTRAVENOUS
  Filled 2021-03-28: qty 8

## 2021-03-28 MED ORDER — SODIUM CHLORIDE 0.9% FLUSH
10.0000 mL | Freq: Once | INTRAVENOUS | Status: AC
  Administered 2021-03-28: 10 mL

## 2021-03-28 MED ORDER — ATROPINE SULFATE 1 MG/ML IJ SOLN
0.5000 mg | Freq: Once | INTRAMUSCULAR | Status: AC | PRN
  Administered 2021-03-28: 0.5 mg via INTRAVENOUS
  Filled 2021-03-28: qty 1

## 2021-03-28 MED ORDER — SODIUM CHLORIDE 0.9 % IV SOLN
10.0000 mg | Freq: Once | INTRAVENOUS | Status: AC
  Administered 2021-03-28: 10 mg via INTRAVENOUS
  Filled 2021-03-28: qty 10

## 2021-03-28 MED ORDER — SODIUM CHLORIDE 0.9% FLUSH
10.0000 mL | INTRAVENOUS | Status: DC | PRN
  Administered 2021-03-28: 10 mL

## 2021-03-28 NOTE — Progress Notes (Signed)
[place]   Telephone:([phone]) [phone] Fax:(336) [phone]   Clinic Follow up Note   Patient Care Team: Patient, No Pcp Per (Inactive) as PCP - General (General Practice) [name], NP as Nurse Practitioner (Oncology) [name], MD as Consulting Physician (Oncology) [name], RN (Inactive) as Oncology Nurse Navigator  Date of Service:  03/28/2021  CHIEF COMPLAINT: f/u of metastatic rectal cancer, h/o colon cancer  CURRENT THERAPY:  First-line FOLFIRI q2weeks starting 08/10/20. dose reduced with cycle 1. Bevacizumab added with cycle 2 on 08/23/2020  ASSESSMENT & PLAN:  Brittney Spencer is a 70 y.o. female with   1. Rectal adenocarcinoma, with liver and right pelvic metastasis, recurrence from previous colon cancer vs new primary, MSS, KRAS mutation (+)  -Diagnosed on routine screening colonoscopy 05/2020, rectal mass biopsy showed invasive adenocarcinoma, arising from a tubular adenoma -Her 07/12/20 PET showed positive uptake of known rectal tumor and soft tissue mass within the right iliac fossa indicating recurrent prior colon cancer. There is also left lobe of liver lesion is FDG avid concerning for liver metastasis. pt declined liver biopsy -She began first-line systemic chemo with dose-reduced FOLFIRI on 08/06/20. Bevacizumab added, and irinotecan/5FU increased with cycle 2, but due to poor tolerance/diarrhea irinotecan was dose reduced with cycle 3 and she has been tolerating well. -CT CAP 01/22/21 showed continued good response to treatment, her liver met has further decreased in size.  I discussed the indeterminate left upper lobe groundglass lung nodule, will continue monitoring. -FO was obtained on her rectal tumor biopsy showing MSI stable disease, tumor burden low, KRAS mutation (+), no other targetable mutation.  -Due to patient preference, she was changed to irinotecan and bevacizumab maintenance therapy. She is tolerating treatment very well  -Her  last tumor marker on 02/28/21 was back in the normal range at 4.41. No need to add 5FU back at this time. -labs reviewed, overall adequate for treatment today. -plan for restaging CT in late 03/2021.   2. Cancer of ascending colon, pT4bN1bM0, stage IIIC, MSI-stable, (+) surgical margins at pelvic wall     -She was diagnosed in 12/2015. She is s/p right hemicolectomy with right salpingo oophorectomy and adjuvant ChemoRT. -she declined adjuvant chemo due to the concern of side effects and impact on her quality of life. -Her 04/2019 CT scan was NED  -With recent rectal cancer diagnosis, her PET from 07/12/20 indicates soft tissue mass within the right iliac fossa is noted and consistent with local tumor recurrence from previous ascending colon tumor.   3. Mild Anemia -Has been stable and mild for the past year.  -worse lately due to her newly diagnosed rectal cancer and starting chemo. Denies overt GI bleeding. -Moderate and stable lately. Hgb 9.3 today (02/28/21)   4. HTN, Anxiety -She'll follow-up with her primary care physician and continue medication -She uses Xanax as needed. She is stable.  -She continues to cope with her diagnosis and treatment.    5. Hypokalemia -resolved with increased potassium in diet   6. 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 -I discussed that germline ATM mutation increased risk of breast cancer, and I recommend her to have genetic testing.  She will think about it and let me know.      Plan:  -proceed with C7 chemo with irinotecan and bevacizumab today -labs, f/u, and C8 in 2 weeks.    No problem-specific Assessment & Plan notes found for  this encounter.   SUMMARY OF ONCOLOGIC HISTORY: Oncology History Overview Note  Cancer Staging Cancer of ascending colon [facility]) Staging form: Colon and Rectum, AJCC 7th Edition - Clinical stage from 02/07/2016: Stage IIIC (T4b, N1b, M0) - Signed by [name],  MD on 03/04/2016 Laterality: Right Residual tumor (R): R2 - Macroscopic    Cancer of ascending colon ([place])  10/25/2015 Imaging   CT ABD/PELVIS:  Inflammatory changes inferior to the cecal tip appear improved, there is still irregular soft tissue thickening of the cecal tip, and there are adjacent prominent lymph nodes in the ileocolonic mesentery, measuring 13 mm on image 49 and 8 mm on image 52. In addition, there is a 2.5 x 1.8 cm nodule on image 46 which has central low density. Therefore, these findings are moderately suspicious for an underlying cecal malignancy with perforation.    01/19/2016 Procedure   COLONOSCOPY per Dr. [name]: Fungating, ulcerated mass almost obstructing mid ascending colon   01/19/2016 Initial Biopsy   Diagnosis Surgical [P], cecal mass - INVASIVE ADENOCARCINOMA WITH ULCERATION. - SEE COMMENT.   02/05/2016 Tumor Marker   Patient's tumor was tested for the following markers: CEA Results of the tumor marker test revealed 5.7.   02/07/2016 Initial Diagnosis   Cancer of ascending colon ([place])   02/07/2016 Definitive Surgery   Laparoscopic assisted right hemicolectomy and right salpingo oopherectomy--Dr. [name]   02/07/2016 Pathologic Stage   p T4 N1b   2/43 nodes +   02/07/2016 Pathology Results   MMR normal; G2 adenocarcinoma;proximal & distal margins negative; soft tissue mass on pelvic sidewall + for adenocarcinoma with positive margin MSI Stable   03/08/2016 Imaging   CT chest negative for metastasis.    03/19/2016 - 04/25/2016 Radiation Therapy   Adjuvant irradiation, 50 gray in 28 fractions   03/19/2016 - 04/22/2016 Chemotherapy   Xeloda 1500 mg twice daily, started on 03/19/2016, dose reduced to 1000 mg twice daily from week 3 due to neutropenia, and patient stopped 3 days before last dose radiation due to difficulty swallowing the pill    05/20/2016 -  Adjuvant Chemotherapy   Patient declined adjuvant chemotherapy   09/16/2016 Imaging   CT CAP w  Contrast 1. No evidence of local tumor recurrence at the ileocolic anastomosis. 2. No findings suspicious for metastatic disease in the chest, abdomen or pelvis. 3. Nonspecific trace free fluid in the pelvic cul-de-sac. 4. Stable solitary 3 mm right upper lobe pulmonary nodule, for which 6 month stability has been demonstrated, probably benign. 5. Additional findings include stable right posterior pericardial cyst and small calcified uterine fibroids.   05/13/2017 Imaging   CT CAP W Contrast 05/13/17 IMPRESSION: 1. No current findings of residual or recurrent malignancy. 2. Mild prominence of stool throughout the colon. Nondistended portions of the rectum. 3. Several tiny pulmonary nodules are stable from the earliest available comparison of 03/08/2016 and probably benign, but may merit surveillance. 4. Other imaging findings of potential clinical significance: Old granulomatous disease. Aortoiliac atherosclerotic vascular disease. Lumbar spondylosis and degenerative disc disease. Stable amount of trace free pelvic fluid.   04/27/2018 Imaging   04/27/2018 CT CAP IMPRESSION: Stable exam. No evidence of recurrent or metastatic carcinoma within the chest, abdomen, or pelvis   04/19/2019 Imaging   CT CAP W Contrast  IMPRESSION: Chest Impression:   1. No evidence of thoracic metastasis. 2. Stable small bilateral pulmonary nodules.   Abdomen / Pelvis Impression:   1. No evidence local colorectal carcinoma recurrence or metastasis in the abdomen  pelvis. 2. Post RIGHT hemicolectomy.   01/22/2021 Imaging   CT CAP  IMPRESSION: CT CHEST IMPRESSION   1. Similar nonspecific pulmonary nodules. 2. New posterior left upper lobe reticulonodular opacity, suspicious for interval mild infection or inflammation. 3. No thoracic adenopathy.   CT ABDOMEN AND PELVIS IMPRESSION   1. Further decrease in size of high left hepatic lobe 3 mm low-density lesion. No new or progressive  metastatic disease within the abdomen or pelvis. 2. Similar trace free pelvic fluid. 3. Similar nonspecific mid rectal wall thickening. 4.  Aortic Atherosclerosis (ICD10-I70.0).   Rectal cancer metastasized to liver ([place])  06/15/2020 Procedure   Screening Colonoscopy by Dr [name]  IMPRESSION - Decreased sphincter tone and internal hemorrhoids that prolapse with straining, but require manual replacement into the anal canal (Grade III) found on digital rectal exam. - Patent side-to-side ileo-colonic anastomosis, characterized by healthy appearing mucosa. - The examined portion of the ileum was normal. - One diminutive polyp in the proximal transverse colon, removed with a cold biopsy forceps. Resected and retrieved. - Likely malignant partially obstructing tumor in the mid rectum. Biopsied. Tattooed. - The examination was otherwise normal on direct and retroflexion views.   06/15/2020 Initial Biopsy   Diagnosis 1. Transverse Colon Polyp - HYPERPLASTIC POLYP 2. Rectum, biopsy - ADENOCARCINOMA ARISING IN A TUBULAR ADENOMA WITH HIGH-GRADE DYSPLASIA. SEE NOTE Diagnosis Note 2. Dr. [name] reviewed the case and concurs with the diagnosis. Dr. [name] was notified on 06/16/2020.   06/28/2020 Imaging   CT CAP  IMPRESSION: 1. New low-density focus in the anterior aspect of the lateral segment LEFT hepatic lobe measuring 1.2 x 1.0 cm, compatible with small metastatic lesion in the LEFT hepatic lobe. 2. Soft tissue in the RIGHT iliac fossa following RIGHT hemicolectomy invades the psoas musculature and is slowly enlarging over time, more linear on the prior study now highly concerning for recurrence/metastasis to this location. 3. Signs of enteritis, potentially post radiation changes of the small bowel. Tethered small bowel in the RIGHT lower quadrant shows focal thickening and narrowing suspicious for small bowel involvement and developing partial obstruction though currently contrast  passes beyond this point into the colon. 4. Rectal thickening in this patient with known rectal mass as described. 5. No evidence of metastatic disease in the chest. 6. Stable small pulmonary nodules. 7.  and aortic atherosclerosis.   Aortic Atherosclerosis (ICD10-I70.0) and Emphysema (ICD10-J43.9).   07/04/2020 Initial Diagnosis   Rectal cancer metastasized to liver ([place])   07/12/2020 PET scan   IMPRESSION: 1. Exam positive for FDG avid rectal tumor which corresponds to the recent colonoscopy findings. 2. FDG avid soft tissue mass within the right iliac fossa is noted and consistent with local tumor recurrence from previous ascending colon tumor. 3. Lateral segment left lobe of liver lesion is FDG avid concerning for liver metastasis. 4. No specific findings identified to suggest metastatic disease to the chest.   08/10/2020 -  Chemotherapy   First-line FOLFIRI q2weeks starting 08/10/20. dose reduced with cycle 1. Irinotecan/5FU increased and Bevacizumab added with cycle 2 on 08/23/2020    10/27/2020 Imaging   CT CAP  IMPRESSION: 1. Interval decrease in size of the hypermetabolic left hepatic lesion, consistent with metastatic disease. No new liver lesion evident. 2. Interval resolution of the hypermetabolic soft tissue lesion along the right iliac fossa with no measurable soft tissue lesion remaining at this location today. 3. Similar appearance of soft tissue fullness in the rectum at the site of the hypermetabolic lesion seen  previously. 4. Stable tiny bilateral pulmonary nodules. Continued attention on follow-up recommended. 5. Small volume free fluid in the pelvis. 6. Aortic Atherosclerosis (ICD10-I70.0).   01/22/2021 Imaging   CT CAP  IMPRESSION: CT CHEST IMPRESSION   1. Similar nonspecific pulmonary nodules. 2. New posterior left upper lobe reticulonodular opacity, suspicious for interval mild infection or inflammation. 3. No thoracic adenopathy.   CT ABDOMEN  AND PELVIS IMPRESSION   1. Further decrease in size of high left hepatic lobe 3 mm low-density lesion. No new or progressive metastatic disease within the abdomen or pelvis. 2. Similar trace free pelvic fluid. 3. Similar nonspecific mid rectal wall thickening. 4.  Aortic Atherosclerosis (ICD10-I70.0).      INTERVAL HISTORY:  Brittney Spencer is here for a follow up of metastatic rectal cancer. She was last seen by me 2 weeks ago.  She is doing well overall. She has gained about 5 lbs since last visit She had right cataract eye surgery last week and is doing well  No N/V or diarrhea, mild fatigue after chemo    All other systems were reviewed with the patient and are negative.  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 Some residual intraretinal fluid remains, very small localized subfoveal of fluid remains although this continues to slowly resorb. We'll continue to observe.   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   Resolved March 2021 post vitrectomy    SURGICAL HISTORY: Past Surgical History:  Procedure Laterality Date   COLONOSCOPY  2018   [facility]   COLONSCOPY  12/2015   IR IMAGING GUIDED PORT INSERTION  08/04/2020   LAPAROSCOPIC RIGHT HEMI COLECTOMY Right 02/07/2016   Procedure: LAPAROSCOPIC ASSISTED RIGHT HEMI COLECTOMY AND RIGHT SALPINGO OOPHERECTOMY;  Surgeon: [name], MD;   Location: [facility];  Service: General;  Laterality: Right;   RETINAL DETACHMENT SURGERY  09/2019    I have reviewed the social history and family history with the patient and they are unchanged from previous note.  ALLERGIES:  is allergic to fish allergy, peanut-containing drug products, soy allergy, and buspirone.  MEDICATIONS:  Current Outpatient Medications  Medication Sig Dispense Refill   ALPRAZolam (XANAX) 0.25 MG tablet Take 1 tablet (0.25 mg total) by mouth daily as needed for anxiety. 30 tablet 0   Cholecalciferol (VITAMIN D3 PO) Take by mouth daily.     diphenoxylate-atropine (LOMOTIL) 2.5-0.025 MG tablet Take 2 tablets by mouth 4 (four) times daily as needed for diarrhea or loose stools. 45 tablet 3   docusate sodium (COLACE) 100 MG capsule 1 capsule as needed     lidocaine-prilocaine (EMLA) cream Apply 1 application topically as needed. 30 g 1   NORVASC 2.5 MG tablet TAKE 1 TABLET BY MOUTH EVERY DAY 90 tablet 1   ondansetron (ZOFRAN) 8 MG tablet Take 1 tablet (8 mg  total) by mouth every 8 (eight) hours as needed for nausea or vomiting. 20 tablet 2   Current Facility-Administered Medications  Medication Dose Route Frequency Provider Last Rate Last Admin   0.9 %  sodium chloride infusion  500 mL Intravenous Once Doran Stabler, MD       Facility-Administered Medications Ordered in Other Visits  Medication Dose Route Frequency Provider Last Rate Last Admin   heparin lock flush 100 unit/mL  500 Units Intracatheter Once PRN [name], MD       irinotecan (CAMPTOSAR) 180 mg in sodium chloride 0.9 % 500 mL chemo infusion  130 mg/m2 (Order-Specific) Intravenous Once [name], MD 339 mL/hr at 03/28/21 1304 180 mg at 03/28/21 1304   sodium chloride flush (NS) 0.9 % injection 10 mL  10 mL Intracatheter PRN [name], MD        PHYSICAL EXAMINATION: ECOG PERFORMANCE STATUS: 1 - Symptomatic but completely ambulatory  Vitals:   03/28/21 1054  BP: 133/77  Pulse: 72  Resp: 17   Temp: 97.8 F (36.6 C)  SpO2: 100%   Wt Readings from Last 3 Encounters:  03/28/21 99 lb 4.8 oz (45 kg)  03/14/21 95 lb 4 oz (43.2 kg)  02/28/21 98 lb 2 oz (44.5 kg)     GENERAL:alert, no distress and comfortable SKIN: skin color normal, no rashes or significant lesions EYES: normal, Conjunctiva are pink and non-injected, sclera clear  NEURO: alert & oriented x 3 with fluent speech  LABORATORY DATA:  I have reviewed the data as listed CBC Latest Ref Rng & Units 03/28/2021 03/14/2021 02/28/2021  WBC 4.0 - 10.5 K/uL 2.8(L) 6.3 2.4(L)  Hemoglobin 12.0 - 15.0 g/dL 9.7(L) 10.0(L) 9.3(L)  Hematocrit 36.0 - 46.0 % 31.6(L) 32.8(L) 30.3(L)  Platelets 150 - 400 K/uL 203 195 156     CMP Latest Ref Rng & Units 03/28/2021 03/14/2021 02/28/2021  Glucose 70 - 99 mg/dL 104(H) 95 102(H)  BUN 8 - 23 mg/dL _0 Creatinine 0.44 - 1.00 mg/dL 0.75 0.75 0.79  Sodium 135 - 145 mmol/L 139 138 140  Potassium 3.5 - 5.1 mmol/L 4.1 4.0 3.8  Chloride 98 - 111 mmol/L 107 106 107  CO2 22 - 32 mmol/L _1 Calcium 8.9 - 10.3 mg/dL 9.0 9.0 8.9  Total Protein 6.5 - 8.1 g/dL 6.6 6.8 6.5  Total Bilirubin 0.3 - 1.2 mg/dL 0.3 0.3 0.3  Alkaline Phos 38 - 126 U/L 61 75 59  AST 15 - 41 U/L _2 ALT 0 - 44 U/L _3 RADIOGRAPHIC STUDIES: I have personally reviewed the radiological images as listed and agreed with the findings in the report. No results found.    Orders Placed This Encounter  Procedures   CT CHEST ABDOMEN PELVIS W CONTRAST    Standing Status:   Future    Standing Expiration Date:   03/28/2022    Order Specific Question:   Preferred imaging location?    Answer:   [facility]    Order Specific Question:   Is Oral Contrast requested for this exam?    Answer:   Yes, Per Radiology protocol    Order Specific Question:   Reason for Exam (SYMPTOM  OR DIAGNOSIS REQUIRED)    Answer:   restaging, pt is on chemo    All questions were answered. The patient knows to call the  clinic with any problems, questions or concerns. No  barriers to learning was detected. The total time spent in the appointment was 30 minutes.     [name], MD 03/28/2021   I, Wilburn Mylar, am acting as scribe for [name], MD.   I have reviewed the above documentation for accuracy and completeness, and I agree with the above.

## 2021-03-28 NOTE — Patient Instructions (Signed)
[facility]  Discharge Instructions: Thank you for choosing [facility] to provide your oncology and hematology care.   If you have a lab appointment with the [facility], please go directly to the [facility] and check in at the registration area.   Wear comfortable clothing and clothing appropriate for easy access to any Portacath or PICC line.   We strive to give you quality time with your provider. You may need to reschedule your appointment if you arrive late (15 or more minutes).  Arriving late affects you and other patients whose appointments are after yours.  Also, if you miss three or more appointments without notifying the office, you may be dismissed from the clinic at the provider's discretion.      For prescription refill requests, have your pharmacy contact our office and allow 72 hours for refills to be completed.    Today you received the following chemotherapy and/or immunotherapy agents Avastin, Irinotecan      To help prevent nausea and vomiting after your treatment, we encourage you to take your nausea medication as directed.  BELOW ARE SYMPTOMS THAT SHOULD BE REPORTED IMMEDIATELY: *FEVER GREATER THAN 100.4 F (38 C) OR HIGHER *CHILLS OR SWEATING *NAUSEA AND VOMITING THAT IS NOT CONTROLLED WITH YOUR NAUSEA MEDICATION *UNUSUAL SHORTNESS OF BREATH *UNUSUAL BRUISING OR BLEEDING *URINARY PROBLEMS (pain or burning when urinating, or frequent urination) *BOWEL PROBLEMS (unusual diarrhea, constipation, pain near the anus) TENDERNESS IN MOUTH AND THROAT WITH OR WITHOUT PRESENCE OF ULCERS (sore throat, sores in mouth, or a toothache) UNUSUAL RASH, SWELLING OR PAIN  UNUSUAL VAGINAL DISCHARGE OR ITCHING   Items with * indicate a potential emergency and should be followed up as soon as possible or go to the Emergency Department if any problems should occur.  Please show the CHEMOTHERAPY ALERT CARD or IMMUNOTHERAPY ALERT CARD at  check-in to the Emergency Department and triage nurse.  Should you have questions after your visit or need to cancel or reschedule your appointment, please contact [facility]  Dept: [phone]  and follow the prompts.  Office hours are 8:00 a.m. to 4:30 p.m. Monday - Friday. Please note that voicemails left after 4:00 p.m. may not be returned until the following business day.  We are closed weekends and major holidays. You have access to a nurse at all times for urgent questions. Please call the main number to the clinic Dept: [phone] and follow the prompts.   For any non-urgent questions, you may also contact your provider using MyChart. We now offer e-Visits for anyone 24 and older to request care online for non-urgent symptoms. For details visit [email or web address].   Also download the MyChart app! Go to the app store, search "MyChart", open the app, select [facility], and log in with your MyChart username and password.  Due to Covid, a mask is required upon entering the hospital/clinic. If you do not have a mask, one will be given to you upon arrival. For doctor visits, patients may have 1 support person aged 50 or older with them. For treatment visits, patients cannot have anyone with them due to current Covid guidelines and our immunocompromised population.

## 2021-03-28 NOTE — Progress Notes (Signed)
ANC 1.3.  Per Dr. Burr Medico "yes, OK to treat, plan to give GCSF on Friday".

## 2021-03-30 ENCOUNTER — Other Ambulatory Visit: Payer: Self-pay

## 2021-03-30 ENCOUNTER — Inpatient Hospital Stay: Payer: Federal, State, Local not specified - PPO

## 2021-03-30 VITALS — BP 139/80 | HR 70 | Temp 98.4°F | Resp 18

## 2021-03-30 DIAGNOSIS — Z5112 Encounter for antineoplastic immunotherapy: Secondary | ICD-10-CM | POA: Diagnosis not present

## 2021-03-30 DIAGNOSIS — C20 Malignant neoplasm of rectum: Secondary | ICD-10-CM

## 2021-03-30 MED ORDER — PEGFILGRASTIM-CBQV 6 MG/0.6ML ~~LOC~~ SOSY
6.0000 mg | PREFILLED_SYRINGE | Freq: Once | SUBCUTANEOUS | Status: AC
  Administered 2021-03-30: 6 mg via SUBCUTANEOUS
  Filled 2021-03-30: qty 0.6

## 2021-04-06 ENCOUNTER — Encounter: Payer: Self-pay | Admitting: Hematology

## 2021-04-10 MED FILL — Dexamethasone Sodium Phosphate Inj 100 MG/10ML: INTRAMUSCULAR | Qty: 1 | Status: AC

## 2021-04-10 NOTE — Progress Notes (Signed)
Brittney Spencer   [phone] Fax:([phone]) [phone]   Clinic Follow up Note   Patient Care Team: Patient, No Pcp Per (Inactive) as PCP - General (General Practice) [name], NP as Nurse Practitioner (Oncology) [name], MD as Consulting Physician (Oncology) [name], RN (Inactive) as Oncology Nurse Navigator 04/11/2021  CHIEF COMPLAINT: Follow-up metastatic rectal cancer and history of colon cancer  SUMMARY OF ONCOLOGIC HISTORY: Oncology History Overview Note  Cancer Staging Cancer of ascending colon [facility]) Staging form: Colon and Rectum, AJCC 7th Edition - Clinical stage from 02/07/2016: Stage IIIC (T4b, N1b, M0) - Signed by [name], MD on 03/04/2016 Laterality: Right Residual tumor (R): R2 - Macroscopic    Cancer of ascending colon ([place])  10/25/2015 Imaging   CT ABD/PELVIS:  Inflammatory changes inferior to the cecal tip appear improved, there is still irregular soft tissue thickening of the cecal tip, and there are adjacent prominent lymph nodes in the ileocolonic mesentery, measuring 13 mm on image 49 and 8 mm on image 52. In addition, there is a 2.5 x 1.8 cm nodule on image 46 which has central low density. Therefore, these findings are moderately suspicious for an underlying cecal malignancy with perforation.    01/19/2016 Procedure   COLONOSCOPY per Dr. [name]: Fungating, ulcerated mass almost obstructing mid ascending colon   01/19/2016 Initial Biopsy   Diagnosis Surgical [P], cecal mass - INVASIVE ADENOCARCINOMA WITH ULCERATION. - SEE COMMENT.   02/05/2016 Tumor Marker   Patient's tumor was tested for the following markers: CEA Results of the tumor marker test revealed 5.7.   02/07/2016 Initial Diagnosis   Cancer of ascending colon ([place])   02/07/2016 Definitive Surgery   Laparoscopic assisted right hemicolectomy and right salpingo oopherectomy--Dr. [name]   02/07/2016 Pathologic Stage   p T4 N1b   2/43 nodes +   02/07/2016  Pathology Results   MMR normal; G2 adenocarcinoma;proximal & distal margins negative; soft tissue mass on pelvic sidewall + for adenocarcinoma with positive margin MSI Stable   03/08/2016 Imaging   CT chest negative for metastasis.    03/19/2016 - 04/25/2016 Radiation Therapy   Adjuvant irradiation, 50 gray in 28 fractions   03/19/2016 - 04/22/2016 Chemotherapy   Xeloda 1500 mg twice daily, started on 03/19/2016, dose reduced to 1000 mg twice daily from week 3 due to neutropenia, and patient stopped 3 days before last dose radiation due to difficulty swallowing the pill    05/20/2016 -  Adjuvant Chemotherapy   Patient declined adjuvant chemotherapy   09/16/2016 Imaging   CT CAP w Contrast 1. No evidence of local tumor recurrence at the ileocolic anastomosis. 2. No findings suspicious for metastatic disease in the chest, abdomen or pelvis. 3. Nonspecific trace free fluid in the pelvic cul-de-sac. 4. Stable solitary 3 mm right upper lobe pulmonary nodule, for which 6 month stability has been demonstrated, probably benign. 5. Additional findings include stable right posterior pericardial cyst and small calcified uterine fibroids.   05/13/2017 Imaging   CT CAP W Contrast 05/13/17 IMPRESSION: 1. No current findings of residual or recurrent malignancy. 2. Mild prominence of stool throughout the colon. Nondistended portions of the rectum. 3. Several tiny pulmonary nodules are stable from the earliest available comparison of 03/08/2016 and probably benign, but may merit surveillance. 4. Other imaging findings of potential clinical significance: Old granulomatous disease. Aortoiliac atherosclerotic vascular disease. Lumbar spondylosis and degenerative disc disease. Stable amount of trace free pelvic fluid.   04/27/2018 Imaging   04/27/2018 CT  CAP IMPRESSION: Stable exam. No evidence of recurrent or metastatic carcinoma within the chest, abdomen, or pelvis   04/19/2019 Imaging   CT  CAP W Contrast  IMPRESSION: Chest Impression:   1. No evidence of thoracic metastasis. 2. Stable small bilateral pulmonary nodules.   Abdomen / Pelvis Impression:   1. No evidence local colorectal carcinoma recurrence or metastasis in the abdomen pelvis. 2. Post RIGHT hemicolectomy.   01/22/2021 Imaging   CT CAP  IMPRESSION: CT CHEST IMPRESSION   1. Similar nonspecific pulmonary nodules. 2. New posterior left upper lobe reticulonodular opacity, suspicious for interval mild infection or inflammation. 3. No thoracic adenopathy.   CT ABDOMEN AND PELVIS IMPRESSION   1. Further decrease in size of high left hepatic lobe 3 mm low-density lesion. No new or progressive metastatic disease within the abdomen or pelvis. 2. Similar trace free pelvic fluid. 3. Similar nonspecific mid rectal wall thickening. 4.  Aortic Atherosclerosis (ICD10-I70.0).   Rectal cancer metastasized to liver ([place])  06/15/2020 Procedure   Screening Colonoscopy by Dr [name]  IMPRESSION - Decreased sphincter tone and internal hemorrhoids that prolapse with straining, but require manual replacement into the anal canal (Grade III) found on digital rectal exam. - Patent side-to-side ileo-colonic anastomosis, characterized by healthy appearing mucosa. - The examined portion of the ileum was normal. - One diminutive polyp in the proximal transverse colon, removed with a cold biopsy forceps. Resected and retrieved. - Likely malignant partially obstructing tumor in the mid rectum. Biopsied. Tattooed. - The examination was otherwise normal on direct and retroflexion views.   06/15/2020 Initial Biopsy   Diagnosis 1. Transverse Colon Polyp - HYPERPLASTIC POLYP 2. Rectum, biopsy - ADENOCARCINOMA ARISING IN A TUBULAR ADENOMA WITH HIGH-GRADE DYSPLASIA. SEE NOTE Diagnosis Note 2. Dr. [name] reviewed the case and concurs with the diagnosis. Dr. [name] was notified on 06/16/2020.   06/28/2020 Imaging   CT CAP   IMPRESSION: 1. New low-density focus in the anterior aspect of the lateral segment LEFT hepatic lobe measuring 1.2 x 1.0 cm, compatible with small metastatic lesion in the LEFT hepatic lobe. 2. Soft tissue in the RIGHT iliac fossa following RIGHT hemicolectomy invades the psoas musculature and is slowly enlarging over time, more linear on the prior study now highly concerning for recurrence/metastasis to this location. 3. Signs of enteritis, potentially post radiation changes of the small bowel. Tethered small bowel in the RIGHT lower quadrant shows focal thickening and narrowing suspicious for small bowel involvement and developing partial obstruction though currently contrast passes beyond this point into the colon. 4. Rectal thickening in this patient with known rectal mass as described. 5. No evidence of metastatic disease in the chest. 6. Stable small pulmonary nodules. 7.  and aortic atherosclerosis.   Aortic Atherosclerosis (ICD10-I70.0) and Emphysema (ICD10-J43.9).   07/04/2020 Initial Diagnosis   Rectal cancer metastasized to liver ([place])   07/12/2020 PET scan   IMPRESSION: 1. Exam positive for FDG avid rectal tumor which corresponds to the recent colonoscopy findings. 2. FDG avid soft tissue mass within the right iliac fossa is noted and consistent with local tumor recurrence from previous ascending colon tumor. 3. Lateral segment left lobe of liver lesion is FDG avid concerning for liver metastasis. 4. No specific findings identified to suggest metastatic disease to the chest.   08/10/2020 -  Chemotherapy   First-line FOLFIRI q2weeks starting 08/10/20. dose reduced with cycle 1. Irinotecan/5FU increased and Bevacizumab added with cycle 2 on 08/23/2020    10/27/2020 Imaging   CT CAP  IMPRESSION: 1. Interval decrease in size of the hypermetabolic left hepatic lesion, consistent with metastatic disease. No new liver lesion evident. 2. Interval resolution of the  hypermetabolic soft tissue lesion along the right iliac fossa with no measurable soft tissue lesion remaining at this location today. 3. Similar appearance of soft tissue fullness in the rectum at the site of the hypermetabolic lesion seen previously. 4. Stable tiny bilateral pulmonary nodules. Continued attention on follow-up recommended. 5. Small volume free fluid in the pelvis. 6. Aortic Atherosclerosis (ICD10-I70.0).   01/22/2021 Imaging   CT CAP  IMPRESSION: CT CHEST IMPRESSION   1. Similar nonspecific pulmonary nodules. 2. New posterior left upper lobe reticulonodular opacity, suspicious for interval mild infection or inflammation. 3. No thoracic adenopathy.   CT ABDOMEN AND PELVIS IMPRESSION   1. Further decrease in size of high left hepatic lobe 3 mm low-density lesion. No new or progressive metastatic disease within the abdomen or pelvis. 2. Similar trace free pelvic fluid. 3. Similar nonspecific mid rectal wall thickening. 4.  Aortic Atherosclerosis (ICD10-I70.0).     CURRENT THERAPY: First-line FOLFIRI q2weeks starting 08/10/20. dose reduced with cycle 1. Bevacizumab added with cycle 2 on 08/23/2020  INTERVAL HISTORY: Brittney Spencer returns for follow-up and treatment as scheduled.  Last seen by Dr. Burr Medico 03/28/2021 and completed another cycle of bevacizumab and irinotecan.  She is doing well in general, remains active at home.  Eating and drinking well.  Denies new or worsening pain.  Bowels are soft, no nausea/vomiting.  Hands are discolored, no neuropathy.  She plans to get the flu shot.  Denies fever, chills, cough, chest pain, dyspnea, leg edema, or any new complaints.   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 Some residual intraretinal fluid remains, very small localized subfoveal of fluid remains although this continues to slowly resorb. We'll continue to observe.   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  ALLERGIES:  is allergic to fish allergy, peanut-containing drug products, soy allergy, and buspirone.  MEDICATIONS:  Current Outpatient Medications  Medication Sig Dispense Refill   ALPRAZolam (XANAX) 0.25 MG tablet Take 1 tablet (0.25 mg total) by mouth daily as needed for anxiety. 30 tablet 0   Cholecalciferol (  VITAMIN D3 PO) Take by mouth daily.     diphenoxylate-atropine (LOMOTIL) 2.5-0.025 MG tablet Take 2 tablets by mouth 4 (four) times daily as needed for diarrhea or loose stools. 45 tablet 3   docusate sodium (COLACE) 100 MG capsule 1 capsule as needed     lidocaine-prilocaine (EMLA) cream Apply 1 application topically as needed. 30 g 1   NORVASC 2.5 MG tablet TAKE 1 TABLET BY MOUTH EVERY DAY 90 tablet 1   ondansetron (ZOFRAN) 8 MG tablet Take 1 tablet (8 mg total)  by mouth every 8 (eight) hours as needed for nausea or vomiting. 20 tablet 2   Current Facility-Administered Medications  Medication Dose Route Frequency Provider Last Rate Last Admin   0.9 %  sodium chloride infusion  500 mL Intravenous Once Danis, Estill Cotta III, MD        PHYSICAL EXAMINATION: ECOG PERFORMANCE STATUS: 1 - Symptomatic but completely ambulatory  Vitals:   04/11/21 1028  BP: 130/71  Pulse: 79  Resp: 18  Temp: (!) 97 F (36.1 C)  SpO2: 100%   Filed Weights   04/11/21 1028  Weight: 98 lb 4 oz (44.6 kg)    GENERAL:alert, no distress and comfortable SKIN: no rash  EYES: sclera clear LUNGS: clear with normal breathing effort HEART: regular rate & rhythm, no lower extremity edema NEURO: alert & oriented x 3 with fluent speech, no focal motor/sensory deficits PAC without erythema    LABORATORY DATA:  I have reviewed the data as listed CBC Latest Ref Rng & Units 04/11/2021 03/28/2021 03/14/2021  WBC 4.0 - 10.5 K/uL 7.0 2.8(L) 6.3  Hemoglobin 12.0 - 15.0 g/dL 9.8(L) 9.7(L) 10.0(L)  Hematocrit 36.0 - 46.0 % 32.3(L) 31.6(L) 32.8(L)  Platelets 150 - 400 K/uL 144(L) 203 195     CMP Latest Ref Rng & Units 03/28/2021 03/14/2021 02/28/2021  Glucose 70 - 99 mg/dL 104(H) 95 102(H)  BUN 8 - 23 mg/dL _0 Creatinine 0.44 - 1.00 mg/dL 0.75 0.75 0.79  Sodium 135 - 145 mmol/L 139 138 140  Potassium 3.5 - 5.1 mmol/L 4.1 4.0 3.8  Chloride 98 - 111 mmol/L 107 106 107  CO2 22 - 32 mmol/L _1 Calcium 8.9 - 10.3 mg/dL 9.0 9.0 8.9  Total Protein 6.5 - 8.1 g/dL 6.6 6.8 6.5  Total Bilirubin 0.3 - 1.2 mg/dL 0.3 0.3 0.3  Alkaline Phos 38 - 126 U/L 61 75 59  AST 15 - 41 U/L _2 ALT 0 - 44 U/L _3 RADIOGRAPHIC STUDIES: I have personally reviewed the radiological images as listed and agreed with the findings in the report. No results found.   ASSESSMENT & PLAN: Brittney Spencer is a 70 y.o. female with   1. Rectal adenocarcinoma, with liver and right  pelvic metastasis, recurrence from previous colon cancer vs new primary   -Diagnosed on routine screening colonoscopy 05/2020, rectal mass biopsy showed invasive adenocarcinoma, arising from a tubular adenoma -Her 07/12/20 PET showed positive uptake of known rectal tumor and soft tissue mass within the right iliac fossa indicating recurrent prior colon cancer. There is also left lobe of liver lesion is FDG avid concerning for liver metastasis. pt declined liver biopsy -whether this is metastatic rectal or recurrent/metastatic colon cancer, this is likely not curable, but still treatable. She previously declined referral for HIPEC -her FO is still pending, will see if she is eligible for EGFR inhibitor -She began first-line  systemic chemo with dose-reduced FOLFIRI on 08/06/20. -Bevacizumab added, and irinotecan/5FU increased with cycle 2, but due to poor tolerance/diarrhea irinotecan was reduced with cycle 3 to 130 mg per metered squared, tolerating well -CT CAP 01/22/2021 showed continued good response to treatment, decreased size and liver metastasis, and indeterminate left upper lobe ground Glass lung nodule.  Tumor marker has normalized on treatment -She changed to maintenance therapy with irinotecan and bevacizumab  -Plan to restage before next cycle in 2 weeks   2. Cancer of ascending colon, pT4bN1bM0, stage IIIC, MSI-stable, (+) surgical margins at pelvic wall     -She was diagnosed in 12/2015. She is s/p right hemicolectomy with right salpingo oophorectomy and  adjuvant ChemoRT. -she declined adjuvant chemo due to the concern of side effects and impact on her quality of life. -Her 04/2019 CT scan was NED  -With recent rectal cancer diagnosis, her PET from 07/12/20 indicates soft tissue mass within the right iliac fossa is noted and consistent with local tumor recurrence from previous ascending colon tumor. -She declined option of liver biopsy.     3. Mild Anemia -Has been stable and mild for the  past year.  -worse lately due to rectal cancer and starting chemo -denies bleeding -monitoring   4. HTN, Anxiety -She'll follow-up with her primary care physician and continue medication -She uses half tab Xanax as needed, mostly only on treatment days. refilled -stable, not worse since her recent rectal cancer diagnosis    Disposition: Ms. Kneeland appears stable.  She continues q2 week irinotecan and bevacizumab.  She tolerates treatment very well overall.  She is able to recover and function well.  There is no clinical evidence of disease progression.  Labs reviewed, adequate to proceed with irinotecan and bevacizumab today as planned.  ANC 5.3, will omit G-CSF this cycle.  The plan is to restage after this cycle.  Follow-up in 2 weeks with CT results and next treatment.  All questions were answered. The patient knows to call the clinic with any problems, questions or concerns. No barriers to learning were detected.     [name], NP 04/11/21

## 2021-04-11 ENCOUNTER — Other Ambulatory Visit: Payer: Self-pay

## 2021-04-11 ENCOUNTER — Encounter: Payer: Self-pay | Admitting: Nurse Practitioner

## 2021-04-11 ENCOUNTER — Inpatient Hospital Stay: Payer: Federal, State, Local not specified - PPO | Attending: Nurse Practitioner | Admitting: Nurse Practitioner

## 2021-04-11 ENCOUNTER — Inpatient Hospital Stay: Payer: Federal, State, Local not specified - PPO

## 2021-04-11 VITALS — BP 130/71 | HR 79 | Temp 97.0°F | Resp 18 | Wt 98.2 lb

## 2021-04-11 VITALS — BP 110/67 | HR 90 | Resp 18

## 2021-04-11 DIAGNOSIS — C787 Secondary malignant neoplasm of liver and intrahepatic bile duct: Secondary | ICD-10-CM

## 2021-04-11 DIAGNOSIS — Z923 Personal history of irradiation: Secondary | ICD-10-CM | POA: Diagnosis not present

## 2021-04-11 DIAGNOSIS — Z5111 Encounter for antineoplastic chemotherapy: Secondary | ICD-10-CM | POA: Diagnosis present

## 2021-04-11 DIAGNOSIS — I10 Essential (primary) hypertension: Secondary | ICD-10-CM | POA: Diagnosis not present

## 2021-04-11 DIAGNOSIS — Z1509 Genetic susceptibility to other malignant neoplasm: Secondary | ICD-10-CM | POA: Diagnosis not present

## 2021-04-11 DIAGNOSIS — D649 Anemia, unspecified: Secondary | ICD-10-CM | POA: Diagnosis not present

## 2021-04-11 DIAGNOSIS — Z9221 Personal history of antineoplastic chemotherapy: Secondary | ICD-10-CM | POA: Insufficient documentation

## 2021-04-11 DIAGNOSIS — Z23 Encounter for immunization: Secondary | ICD-10-CM | POA: Insufficient documentation

## 2021-04-11 DIAGNOSIS — C182 Malignant neoplasm of ascending colon: Secondary | ICD-10-CM

## 2021-04-11 DIAGNOSIS — Z5112 Encounter for antineoplastic immunotherapy: Secondary | ICD-10-CM | POA: Insufficient documentation

## 2021-04-11 DIAGNOSIS — C20 Malignant neoplasm of rectum: Secondary | ICD-10-CM

## 2021-04-11 DIAGNOSIS — E876 Hypokalemia: Secondary | ICD-10-CM | POA: Insufficient documentation

## 2021-04-11 DIAGNOSIS — Z79899 Other long term (current) drug therapy: Secondary | ICD-10-CM | POA: Diagnosis not present

## 2021-04-11 DIAGNOSIS — F419 Anxiety disorder, unspecified: Secondary | ICD-10-CM | POA: Insufficient documentation

## 2021-04-11 DIAGNOSIS — Z95828 Presence of other vascular implants and grafts: Secondary | ICD-10-CM

## 2021-04-11 LAB — CBC WITH DIFFERENTIAL (CANCER CENTER ONLY)
Abs Immature Granulocytes: 0.03 10*3/uL (ref 0.00–0.07)
Basophils Absolute: 0 10*3/uL (ref 0.0–0.1)
Basophils Relative: 0 %
Eosinophils Absolute: 0.1 10*3/uL (ref 0.0–0.5)
Eosinophils Relative: 1 %
HCT: 32.3 % — ABNORMAL LOW (ref 36.0–46.0)
Hemoglobin: 9.8 g/dL — ABNORMAL LOW (ref 12.0–15.0)
Immature Granulocytes: 0 %
Lymphocytes Relative: 15 %
Lymphs Abs: 1.1 10*3/uL (ref 0.7–4.0)
MCH: 26.3 pg (ref 26.0–34.0)
MCHC: 30.3 g/dL (ref 30.0–36.0)
MCV: 86.8 fL (ref 80.0–100.0)
Monocytes Absolute: 0.5 10*3/uL (ref 0.1–1.0)
Monocytes Relative: 7 %
Neutro Abs: 5.3 10*3/uL (ref 1.7–7.7)
Neutrophils Relative %: 77 %
Platelet Count: 144 10*3/uL — ABNORMAL LOW (ref 150–400)
RBC: 3.72 MIL/uL — ABNORMAL LOW (ref 3.87–5.11)
RDW: 16 % — ABNORMAL HIGH (ref 11.5–15.5)
WBC Count: 7 10*3/uL (ref 4.0–10.5)
nRBC: 0 % (ref 0.0–0.2)

## 2021-04-11 LAB — CMP (CANCER CENTER ONLY)
ALT: 11 U/L (ref 0–44)
AST: 17 U/L (ref 15–41)
Albumin: 3.7 g/dL (ref 3.5–5.0)
Alkaline Phosphatase: 75 U/L (ref 38–126)
Anion gap: 8 (ref 5–15)
BUN: 17 mg/dL (ref 8–23)
CO2: 24 mmol/L (ref 22–32)
Calcium: 8.8 mg/dL — ABNORMAL LOW (ref 8.9–10.3)
Chloride: 105 mmol/L (ref 98–111)
Creatinine: 0.66 mg/dL (ref 0.44–1.00)
GFR, Estimated: >60 mL/min (ref >60)
Glucose, Bld: 83 mg/dL (ref 70–99)
Potassium: 4.1 mmol/L (ref 3.5–5.1)
Sodium: 137 mmol/L (ref 135–145)
Total Bilirubin: 0.5 mg/dL (ref 0.3–1.2)
Total Protein: 6.7 g/dL (ref 6.5–8.1)

## 2021-04-11 LAB — TOTAL PROTEIN, URINE DIPSTICK: Protein, ur: NEGATIVE mg/dL

## 2021-04-11 MED ORDER — SODIUM CHLORIDE 0.9 % IV SOLN: Freq: Once | INTRAVENOUS | Status: AC

## 2021-04-11 MED ORDER — SODIUM CHLORIDE 0.9 % IV SOLN
130.0000 mg/m2 | Freq: Once | INTRAVENOUS | Status: AC
  Administered 2021-04-11: 180 mg via INTRAVENOUS
  Filled 2021-04-11: qty 9

## 2021-04-11 MED ORDER — SODIUM CHLORIDE 0.9 % IV SOLN
200.0000 mg | Freq: Once | INTRAVENOUS | Status: AC
  Administered 2021-04-11: 200 mg via INTRAVENOUS
  Filled 2021-04-11: qty 8

## 2021-04-11 MED ORDER — SODIUM CHLORIDE 0.9 % IV SOLN
10.0000 mg | Freq: Once | INTRAVENOUS | Status: AC
  Administered 2021-04-11: 10 mg via INTRAVENOUS
  Filled 2021-04-11: qty 10

## 2021-04-11 MED ORDER — SODIUM CHLORIDE 0.9% FLUSH
10.0000 mL | Freq: Once | INTRAVENOUS | Status: AC
  Administered 2021-04-11: 10 mL

## 2021-04-11 MED ORDER — PALONOSETRON HCL INJECTION 0.25 MG/5ML
0.2500 mg | Freq: Once | INTRAVENOUS | Status: AC
  Administered 2021-04-11: 0.25 mg via INTRAVENOUS
  Filled 2021-04-11: qty 5

## 2021-04-11 MED ORDER — HEPARIN SOD (PORK) LOCK FLUSH 100 UNIT/ML IV SOLN
500.0000 [IU] | Freq: Once | INTRAVENOUS | Status: AC | PRN
  Administered 2021-04-11: 500 [IU]

## 2021-04-11 MED ORDER — ATROPINE SULFATE 1 MG/ML IV SOLN
0.5000 mg | Freq: Once | INTRAVENOUS | Status: AC | PRN
  Administered 2021-04-11: 0.5 mg via INTRAVENOUS
  Filled 2021-04-11: qty 1

## 2021-04-11 MED ORDER — SODIUM CHLORIDE 0.9% FLUSH
10.0000 mL | INTRAVENOUS | Status: DC | PRN
  Administered 2021-04-11: 10 mL

## 2021-04-11 NOTE — Patient Instructions (Signed)
[facility] ONCOLOG[facility]   Discharge Instructions: Thank you for choosing [facility] to provide your oncology and hematology care.   If you have a lab appointment with the [facility], please go directly to the [facility] and check in at the registration area.   Wear comfortable clothing and clothing appropriate for easy access to any Portacath or PICC line.   We strive to give you quality time with your provider. You may need to reschedule your appointment if you arrive late (15 or more minutes).  Arriving late affects you and other patients whose appointments are after yours.  Also, if you miss three or more appointments without notifying the office, you may be dismissed from the clinic at the provider's discretion.      For prescription refill requests, have your pharmacy contact our office and allow 72 hours for refills to be completed.    Today you received the following chemotherapy and/or immunotherapy agents: bevacizumab-bvzr and irinotecan.      To help prevent nausea and vomiting after your treatment, we encourage you to take your nausea medication as directed.  BELOW ARE SYMPTOMS THAT SHOULD BE REPORTED IMMEDIATELY: *FEVER GREATER THAN 100.4 F (38 C) OR HIGHER *CHILLS OR SWEATING *NAUSEA AND VOMITING THAT IS NOT CONTROLLED WITH YOUR NAUSEA MEDICATION *UNUSUAL SHORTNESS OF BREATH *UNUSUAL BRUISING OR BLEEDING *URINARY PROBLEMS (pain or burning when urinating, or frequent urination) *BOWEL PROBLEMS (unusual diarrhea, constipation, pain near the anus) TENDERNESS IN MOUTH AND THROAT WITH OR WITHOUT PRESENCE OF ULCERS (sore throat, sores in mouth, or a toothache) UNUSUAL RASH, SWELLING OR PAIN  UNUSUAL VAGINAL DISCHARGE OR ITCHING   Items with * indicate a potential emergency and should be followed up as soon as possible or go to the Emergency Department if any problems should occur.  Please show the CHEMOTHERAPY ALERT CARD or IMMUNOTHERAPY  ALERT CARD at check-in to the Emergency Department and triage nurse.  Should you have questions after your visit or need to cancel or reschedule your appointment, please contact [facility]  Dept: [phone]  and follow the prompts.  Office hours are 8:00 a.m. to 4:30 p.m. Monday - Friday. Please note that voicemails left after 4:00 p.m. may not be returned until the following business day.  We are closed weekends and major holidays. You have access to a nurse at all times for urgent questions. Please call the main number to the clinic Dept: [phone] and follow the prompts.   For any non-urgent questions, you may also contact your provider using MyChart. We now offer e-Visits for anyone 58 and older to request care online for non-urgent symptoms. For details visit [email or web address].   Also download the MyChart app! Go to the app store, search "MyChart", open the app, select [facility], and log in with your MyChart username and password.  Due to Covid, a mask is required upon entering the hospital/clinic. If you do not have a mask, one will be given to you upon arrival. For doctor visits, patients may have 1 support person aged 17 or older with them. For treatment visits, patients cannot have anyone with them due to current Covid guidelines and our immunocompromised population.

## 2021-04-11 NOTE — Progress Notes (Signed)
Dose of Bevacizumab kept at 200 mg today; rounded for vial size.  Kennith Center, Pharm.D., CPP 04/11/2021@12 :27 PM

## 2021-04-12 ENCOUNTER — Telehealth: Payer: Self-pay | Admitting: *Deleted

## 2021-04-12 NOTE — Telephone Encounter (Signed)
Made several attempts to call pt to make aware of scheduled CT. Went to vm each time and advised to call with appointment details. On 4th attempt left vm with details of appt and instructions for contrast. Advised to call facility if there are any questions.

## 2021-04-16 ENCOUNTER — Inpatient Hospital Stay: Payer: Federal, State, Local not specified - PPO

## 2021-04-16 ENCOUNTER — Other Ambulatory Visit: Payer: Self-pay

## 2021-04-16 DIAGNOSIS — Z23 Encounter for immunization: Secondary | ICD-10-CM

## 2021-04-16 DIAGNOSIS — Z5112 Encounter for antineoplastic immunotherapy: Secondary | ICD-10-CM | POA: Diagnosis not present

## 2021-04-16 MED ORDER — INFLUENZA VAC A&B SA ADJ QUAD 0.5 ML IM PRSY
0.5000 mL | PREFILLED_SYRINGE | Freq: Once | INTRAMUSCULAR | Status: AC
  Administered 2021-04-16: 0.5 mL via INTRAMUSCULAR
  Filled 2021-04-16: qty 0.5

## 2021-04-23 ENCOUNTER — Ambulatory Visit (HOSPITAL_COMMUNITY)
Admission: RE | Admit: 2021-04-23 | Discharge: 2021-04-23 | Disposition: A | Discharge status: Home / Self Care | Payer: Federal, State, Local not specified - PPO | Source: Ambulatory Visit | Attending: Hematology | Admitting: Hematology

## 2021-04-23 ENCOUNTER — Encounter (HOSPITAL_COMMUNITY): Payer: Self-pay

## 2021-04-23 DIAGNOSIS — C182 Malignant neoplasm of ascending colon: Secondary | ICD-10-CM | POA: Insufficient documentation

## 2021-04-23 MED ORDER — IOHEXOL 350 MG/ML SOLN
75.0000 mL | Freq: Once | INTRAVENOUS | Status: AC | PRN
  Administered 2021-04-23: 75 mL via INTRAVENOUS

## 2021-04-24 MED FILL — Dexamethasone Sodium Phosphate Inj 100 MG/10ML: INTRAMUSCULAR | Qty: 1 | Status: AC

## 2021-04-25 ENCOUNTER — Other Ambulatory Visit: Payer: Self-pay

## 2021-04-25 ENCOUNTER — Encounter: Payer: Self-pay | Admitting: Hematology

## 2021-04-25 ENCOUNTER — Inpatient Hospital Stay: Payer: Federal, State, Local not specified - PPO

## 2021-04-25 ENCOUNTER — Inpatient Hospital Stay (HOSPITAL_BASED_OUTPATIENT_CLINIC_OR_DEPARTMENT_OTHER): Payer: Federal, State, Local not specified - PPO | Admitting: Hematology

## 2021-04-25 VITALS — BP 128/80 | HR 73 | Temp 98.1°F | Resp 17 | Ht 66.0 in | Wt 101.0 lb

## 2021-04-25 DIAGNOSIS — C787 Secondary malignant neoplasm of liver and intrahepatic bile duct: Secondary | ICD-10-CM

## 2021-04-25 DIAGNOSIS — C20 Malignant neoplasm of rectum: Secondary | ICD-10-CM

## 2021-04-25 DIAGNOSIS — C182 Malignant neoplasm of ascending colon: Secondary | ICD-10-CM | POA: Diagnosis not present

## 2021-04-25 DIAGNOSIS — Z5112 Encounter for antineoplastic immunotherapy: Secondary | ICD-10-CM | POA: Diagnosis not present

## 2021-04-25 DIAGNOSIS — Z95828 Presence of other vascular implants and grafts: Secondary | ICD-10-CM

## 2021-04-25 LAB — CBC WITH DIFFERENTIAL (CANCER CENTER ONLY)
Abs Immature Granulocytes: 0 10*3/uL (ref 0.00–0.07)
Basophils Absolute: 0 10*3/uL (ref 0.0–0.1)
Basophils Relative: 1 %
Eosinophils Absolute: 0.1 10*3/uL (ref 0.0–0.5)
Eosinophils Relative: 3 %
HCT: 30 % — ABNORMAL LOW (ref 36.0–46.0)
Hemoglobin: 9.3 g/dL — ABNORMAL LOW (ref 12.0–15.0)
Immature Granulocytes: 0 %
Lymphocytes Relative: 25 %
Lymphs Abs: 0.8 10*3/uL (ref 0.7–4.0)
MCH: 26.6 pg (ref 26.0–34.0)
MCHC: 31 g/dL (ref 30.0–36.0)
MCV: 85.7 fL (ref 80.0–100.0)
Monocytes Absolute: 0.3 10*3/uL (ref 0.1–1.0)
Monocytes Relative: 10 %
Neutro Abs: 1.9 10*3/uL (ref 1.7–7.7)
Neutrophils Relative %: 61 %
Platelet Count: 209 10*3/uL (ref 150–400)
RBC: 3.5 MIL/uL — ABNORMAL LOW (ref 3.87–5.11)
RDW: 15.7 % — ABNORMAL HIGH (ref 11.5–15.5)
WBC Count: 3.1 10*3/uL — ABNORMAL LOW (ref 4.0–10.5)
nRBC: 0 % (ref 0.0–0.2)

## 2021-04-25 LAB — CMP (CANCER CENTER ONLY)
ALT: 6 U/L (ref 0–44)
AST: 16 U/L (ref 15–41)
Albumin: 3.3 g/dL — ABNORMAL LOW (ref 3.5–5.0)
Alkaline Phosphatase: 60 U/L (ref 38–126)
Anion gap: 10 (ref 5–15)
BUN: 15 mg/dL (ref 8–23)
CO2: 24 mmol/L (ref 22–32)
Calcium: 9.2 mg/dL (ref 8.9–10.3)
Chloride: 105 mmol/L (ref 98–111)
Creatinine: 0.73 mg/dL (ref 0.44–1.00)
GFR, Estimated: >60 mL/min (ref >60)
Glucose, Bld: 87 mg/dL (ref 70–99)
Potassium: 4.3 mmol/L (ref 3.5–5.1)
Sodium: 139 mmol/L (ref 135–145)
Total Bilirubin: 0.4 mg/dL (ref 0.3–1.2)
Total Protein: 6.7 g/dL (ref 6.5–8.1)

## 2021-04-25 LAB — CEA (IN HOUSE-CHCC): CEA (CHCC-In House): 3.92 ng/mL (ref 0.00–5.00)

## 2021-04-25 LAB — TOTAL PROTEIN, URINE DIPSTICK: Protein, ur: NEGATIVE mg/dL

## 2021-04-25 MED ORDER — SODIUM CHLORIDE 0.9 % IV SOLN
200.0000 mg | Freq: Once | INTRAVENOUS | Status: DC
  Filled 2021-04-25: qty 8

## 2021-04-25 MED ORDER — SODIUM CHLORIDE 0.9% FLUSH
10.0000 mL | INTRAVENOUS | Status: DC | PRN
  Administered 2021-04-25: 10 mL

## 2021-04-25 MED ORDER — SODIUM CHLORIDE 0.9% FLUSH
10.0000 mL | Freq: Once | INTRAVENOUS | Status: AC
  Administered 2021-04-25: 10 mL

## 2021-04-25 MED ORDER — SODIUM CHLORIDE 0.9 % IV SOLN
10.0000 mg | Freq: Once | INTRAVENOUS | Status: AC
  Administered 2021-04-25: 10 mg via INTRAVENOUS
  Filled 2021-04-25: qty 10

## 2021-04-25 MED ORDER — ATROPINE SULFATE 1 MG/ML IV SOLN
0.5000 mg | Freq: Once | INTRAVENOUS | Status: AC | PRN
  Administered 2021-04-25: 0.5 mg via INTRAVENOUS
  Filled 2021-04-25: qty 1

## 2021-04-25 MED ORDER — HEPARIN SOD (PORK) LOCK FLUSH 100 UNIT/ML IV SOLN
500.0000 [IU] | Freq: Once | INTRAVENOUS | Status: AC | PRN
  Administered 2021-04-25: 500 [IU]

## 2021-04-25 MED ORDER — PALONOSETRON HCL INJECTION 0.25 MG/5ML
0.2500 mg | Freq: Once | INTRAVENOUS | Status: AC
  Administered 2021-04-25: 0.25 mg via INTRAVENOUS
  Filled 2021-04-25: qty 5

## 2021-04-25 MED ORDER — SODIUM CHLORIDE 0.9 % IV SOLN
Freq: Once | INTRAVENOUS | Status: AC
Start: 2021-04-25 — End: 2021-04-25

## 2021-04-25 MED ORDER — SODIUM CHLORIDE 0.9 % IV SOLN
130.0000 mg/m2 | Freq: Once | INTRAVENOUS | Status: AC
  Administered 2021-04-25: 180 mg via INTRAVENOUS
  Filled 2021-04-25: qty 9

## 2021-04-25 MED ORDER — SODIUM CHLORIDE 0.9 % IV SOLN
225.0000 mg | Freq: Once | INTRAVENOUS | Status: AC
  Administered 2021-04-25: 225 mg via INTRAVENOUS
  Filled 2021-04-25: qty 9

## 2021-04-25 NOTE — Progress Notes (Signed)
[place]   [phone] [phone]   Clinic Follow up Note   Patient Care Team: Patient, No Pcp Per (Inactive) as PCP - General (General Practice) [name], NP as Nurse Practitioner (Oncology) [name], MD as Consulting Physician (Oncology) [name], RN (Inactive) as Oncology Nurse Navigator  Date of Service:  04/25/2021  CHIEF COMPLAINT: f/u of metastatic rectal cancer, h/o colon cancer  CURRENT THERAPY:  Maintenance irinotecan and bevacizumab from 01/24/21  ASSESSMENT & PLAN:  Brittney Spencer is a 70 y.o. female with   1. Rectal adenocarcinoma, with liver and right pelvic metastasis, recurrence from previous colon cancer vs new primary   -Diagnosed on routine screening colonoscopy 05/2020, rectal mass biopsy showed invasive adenocarcinoma, arising from a tubular adenoma -Her 07/12/20 PET showed positive uptake of known rectal tumor and soft tissue mass within the right iliac fossa indicating recurrent prior colon cancer. There is also left lobe of liver lesion is FDG avid concerning for liver metastasis. pt declined liver biopsy -whether this is metastatic rectal or recurrent/metastatic colon cancer, this is likely not curable, but still treatable. She previously declined referral for HIPEC -her FO is still pending, will see if she is eligible for EGFR inhibitor -She began first-line systemic chemo with dose-reduced FOLFIRI on 08/06/20. -Bevacizumab added, and irinotecan/5FU increased with cycle 2, but due to poor tolerance/diarrhea irinotecan was reduced with cycle 3 to 130 mg per metered squared, tolerating well -She changed to maintenance therapy with irinotecan and bevacizumab on 01/24/21. She continues to tolerate this well. -CT CAP 04/23/21 showed continued good response to treatment, decreased size and liver metastasis. Tumor marker has normalized on treatment. -we discussed her treatment during the holidays. We will plan  to hold one dose in November and one in December.   2. Cancer of ascending colon, pT4bN1bM0, stage IIIC, MSI-stable, (+) surgical margins at pelvic wall     -She was diagnosed in 12/2015. She is s/p right hemicolectomy with right salpingo oophorectomy and  adjuvant ChemoRT. -she declined adjuvant chemo due to the concern of side effects and impact on her quality of life. -Her 04/2019 CT scan was NED  -With recent rectal cancer diagnosis, her PET from 07/12/20 indicates soft tissue mass within the right iliac fossa is noted and consistent with local tumor recurrence from previous ascending colon tumor. -She declined option of liver biopsy.     3. Mild Anemia -Has been stable and mild for the past year.  -worse lately due to rectal cancer and starting chemo -denies bleeding -monitoring   4. HTN, Anxiety -She'll follow-up with her primary care physician and continue medication -She uses half tab Xanax as needed, mostly only on treatment days. refilled -stable, not worse since her recent rectal cancer diagnosis    PLAN: -scan reviewed, good PR -lab reviewed, will proceed with irinotecan and bevacizumab today  -lab, f/u, and irinotecan/beva in 2, 6 and 10 weeks   No problem-specific Assessment & Plan notes found for this encounter.   SUMMARY OF ONCOLOGIC HISTORY: Oncology History Overview Note  Cancer Staging Cancer of ascending colon [facility]) Staging form: Colon and Rectum, AJCC 7th Edition - Clinical stage from 02/07/2016: Stage IIIC (T4b, N1b, M0) - Signed by [name], MD on 03/04/2016 Laterality: Right Residual tumor (R): R2 - Macroscopic    Cancer of ascending colon ([place])  10/25/2015 Imaging   CT ABD/PELVIS:  Inflammatory changes inferior to the cecal tip appear improved, there is still irregular soft tissue  thickening of the cecal tip, and there are adjacent prominent lymph nodes in the ileocolonic mesentery, measuring 13 mm on image 49 and 8 mm on image 52. In addition, there  is a 2.5 x 1.8 cm nodule on image 46 which has central low density. Therefore, these findings are moderately suspicious for an underlying cecal malignancy with perforation.    01/19/2016 Procedure   COLONOSCOPY per Dr. [name]: Fungating, ulcerated mass almost obstructing mid ascending colon   01/19/2016 Initial Biopsy   Diagnosis Surgical [P], cecal mass - INVASIVE ADENOCARCINOMA WITH ULCERATION. - SEE COMMENT.   02/05/2016 Tumor Marker   Patient's tumor was tested for the following markers: CEA Results of the tumor marker test revealed 5.7.   02/07/2016 Initial Diagnosis   Cancer of ascending colon ([place])   02/07/2016 Definitive Surgery   Laparoscopic assisted right hemicolectomy and right salpingo oopherectomy--Dr. [name]   02/07/2016 Pathologic Stage   p T4 N1b   2/43 nodes +   02/07/2016 Pathology Results   MMR normal; G2 adenocarcinoma;proximal & distal margins negative; soft tissue mass on pelvic sidewall + for adenocarcinoma with positive margin MSI Stable   03/08/2016 Imaging   CT chest negative for metastasis.    03/19/2016 - 04/25/2016 Radiation Therapy   Adjuvant irradiation, 50 gray in 28 fractions   03/19/2016 - 04/22/2016 Chemotherapy   Xeloda 1500 mg twice daily, started on 03/19/2016, dose reduced to 1000 mg twice daily from week 3 due to neutropenia, and patient stopped 3 days before last dose radiation due to difficulty swallowing the pill    05/20/2016 -  Adjuvant Chemotherapy   Patient declined adjuvant chemotherapy   09/16/2016 Imaging   CT CAP w Contrast 1. No evidence of local tumor recurrence at the ileocolic anastomosis. 2. No findings suspicious for metastatic disease in the chest, abdomen or pelvis. 3. Nonspecific trace free fluid in the pelvic cul-de-sac. 4. Stable solitary 3 mm right upper lobe pulmonary nodule, for which 6 month stability has been demonstrated, probably benign. 5. Additional findings include stable right posterior pericardial cyst  and small calcified uterine fibroids.   05/13/2017 Imaging   CT CAP W Contrast 05/13/17 IMPRESSION: 1. No current findings of residual or recurrent malignancy. 2. Mild prominence of stool throughout the colon. Nondistended portions of the rectum. 3. Several tiny pulmonary nodules are stable from the earliest available comparison of 03/08/2016 and probably benign, but may merit surveillance. 4. Other imaging findings of potential clinical significance: Old granulomatous disease. Aortoiliac atherosclerotic vascular disease. Lumbar spondylosis and degenerative disc disease. Stable amount of trace free pelvic fluid.   04/27/2018 Imaging   04/27/2018 CT CAP IMPRESSION: Stable exam. No evidence of recurrent or metastatic carcinoma within the chest, abdomen, or pelvis   04/19/2019 Imaging   CT CAP W Contrast  IMPRESSION: Chest Impression:   1. No evidence of thoracic metastasis. 2. Stable small bilateral pulmonary nodules.   Abdomen / Pelvis Impression:   1. No evidence local colorectal carcinoma recurrence or metastasis in the abdomen pelvis. 2. Post RIGHT hemicolectomy.   01/22/2021 Imaging   CT CAP  IMPRESSION: CT CHEST IMPRESSION   1. Similar nonspecific pulmonary nodules. 2. New posterior left upper lobe reticulonodular opacity, suspicious for interval mild infection or inflammation. 3. No thoracic adenopathy.   CT ABDOMEN AND PELVIS IMPRESSION   1. Further decrease in size of high left hepatic lobe 3 mm low-density lesion. No new or progressive metastatic disease within the abdomen or pelvis. 2. Similar trace free pelvic fluid. 3.  Similar nonspecific mid rectal wall thickening. 4.  Aortic Atherosclerosis (ICD10-I70.0).   04/23/2021 Imaging   CT CAP  IMPRESSION: 1. Treated metastatic lesion between segments 2 and 3 of the liver, slightly smaller and less distinct than prior examination. No other signs of definite metastatic disease elsewhere in the abdomen  or pelvis. 2. Multiple small pulmonary nodules, stable compared to the prior examination, favored to be benign. No definitive findings to suggest metastatic disease to the thorax. 3. Aortic atherosclerosis. 4. Additional incidental findings, as above.   Rectal cancer metastasized to liver ([place])  06/15/2020 Procedure   Screening Colonoscopy by Dr [name]  IMPRESSION - Decreased sphincter tone and internal hemorrhoids that prolapse with straining, but require manual replacement into the anal canal (Grade III) found on digital rectal exam. - Patent side-to-side ileo-colonic anastomosis, characterized by healthy appearing mucosa. - The examined portion of the ileum was normal. - One diminutive polyp in the proximal transverse colon, removed with a cold biopsy forceps. Resected and retrieved. - Likely malignant partially obstructing tumor in the mid rectum. Biopsied. Tattooed. - The examination was otherwise normal on direct and retroflexion views.   06/15/2020 Initial Biopsy   Diagnosis 1. Transverse Colon Polyp - HYPERPLASTIC POLYP 2. Rectum, biopsy - ADENOCARCINOMA ARISING IN A TUBULAR ADENOMA WITH HIGH-GRADE DYSPLASIA. SEE NOTE Diagnosis Note 2. Dr. [name] reviewed the case and concurs with the diagnosis. Dr. [name] was notified on 06/16/2020.   06/28/2020 Imaging   CT CAP  IMPRESSION: 1. New low-density focus in the anterior aspect of the lateral segment LEFT hepatic lobe measuring 1.2 x 1.0 cm, compatible with small metastatic lesion in the LEFT hepatic lobe. 2. Soft tissue in the RIGHT iliac fossa following RIGHT hemicolectomy invades the psoas musculature and is slowly enlarging over time, more linear on the prior study now highly concerning for recurrence/metastasis to this location. 3. Signs of enteritis, potentially post radiation changes of the small bowel. Tethered small bowel in the RIGHT lower quadrant shows focal thickening and narrowing suspicious for small  bowel involvement and developing partial obstruction though currently contrast passes beyond this point into the colon. 4. Rectal thickening in this patient with known rectal mass as described. 5. No evidence of metastatic disease in the chest. 6. Stable small pulmonary nodules. 7.  and aortic atherosclerosis.   Aortic Atherosclerosis (ICD10-I70.0) and Emphysema (ICD10-J43.9).   07/04/2020 Initial Diagnosis   Rectal cancer metastasized to liver ([place])   07/12/2020 PET scan   IMPRESSION: 1. Exam positive for FDG avid rectal tumor which corresponds to the recent colonoscopy findings. 2. FDG avid soft tissue mass within the right iliac fossa is noted and consistent with local tumor recurrence from previous ascending colon tumor. 3. Lateral segment left lobe of liver lesion is FDG avid concerning for liver metastasis. 4. No specific findings identified to suggest metastatic disease to the chest.   08/10/2020 -  Chemotherapy   First-line FOLFIRI q2weeks starting 08/10/20. dose reduced with cycle 1. Irinotecan/5FU increased and Bevacizumab added with cycle 2 on 08/23/2020    10/27/2020 Imaging   CT CAP  IMPRESSION: 1. Interval decrease in size of the hypermetabolic left hepatic lesion, consistent with metastatic disease. No new liver lesion evident. 2. Interval resolution of the hypermetabolic soft tissue lesion along the right iliac fossa with no measurable soft tissue lesion remaining at this location today. 3. Similar appearance of soft tissue fullness in the rectum at the site of the hypermetabolic lesion seen previously. 4. Stable tiny bilateral pulmonary nodules. Continued  attention on follow-up recommended. 5. Small volume free fluid in the pelvis. 6. Aortic Atherosclerosis (ICD10-I70.0).   01/22/2021 Imaging   CT CAP  IMPRESSION: CT CHEST IMPRESSION   1. Similar nonspecific pulmonary nodules. 2. New posterior left upper lobe reticulonodular opacity, suspicious for  interval mild infection or inflammation. 3. No thoracic adenopathy.   CT ABDOMEN AND PELVIS IMPRESSION   1. Further decrease in size of high left hepatic lobe 3 mm low-density lesion. No new or progressive metastatic disease within the abdomen or pelvis. 2. Similar trace free pelvic fluid. 3. Similar nonspecific mid rectal wall thickening. 4.  Aortic Atherosclerosis (ICD10-I70.0).   04/23/2021 Imaging   CT CAP  IMPRESSION: 1. Treated metastatic lesion between segments 2 and 3 of the liver, slightly smaller and less distinct than prior examination. No other signs of definite metastatic disease elsewhere in the abdomen or pelvis. 2. Multiple small pulmonary nodules, stable compared to the prior examination, favored to be benign. No definitive findings to suggest metastatic disease to the thorax. 3. Aortic atherosclerosis. 4. Additional incidental findings, as above.      INTERVAL HISTORY:  Brittney Spencer is here for a follow up of metastatic rectal cancer. She was last seen by me on 03/28/21 and by NP Lacie in the interim. She presents to the clinic alone. She became teary upon hearing the good news from her scan.  She reports she continues to tolerate chemo well. She notes some constipation. She denies any new or worsening pain.   All other systems were reviewed with the patient and are negative.  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 Some residual intraretinal fluid remains, very small localized subfoveal of fluid remains although this continues to slowly resorb. We'll continue to observe.   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  ALLERGIES:  is allergic to fish allergy, peanut-containing drug products, soy allergy, and buspirone.  MEDICATIONS:  Current Outpatient Medications  Medication Sig Dispense Refill   ALPRAZolam (XANAX) 0.25 MG tablet Take 1 tablet (0.25 mg total) by mouth daily as needed for anxiety. 30 tablet 0   Cholecalciferol (VITAMIN D3 PO) Take by mouth daily.     diphenoxylate-atropine (LOMOTIL) 2.5-0.025 MG tablet Take  2 tablets by mouth 4 (four) times daily as needed for diarrhea or loose stools. 45 tablet 3   docusate sodium (COLACE) 100 MG capsule 1 capsule as needed     lidocaine-prilocaine (EMLA) cream Apply 1 application topically as needed. 30 g 1   NORVASC 2.5 MG tablet TAKE 1 TABLET BY MOUTH EVERY DAY 90 tablet 1   ondansetron (ZOFRAN) 8 MG tablet Take 1 tablet (8 mg total) by mouth every 8 (eight) hours as needed for nausea or vomiting. 20 tablet 2   Current Facility-Administered Medications  Medication Dose Route Frequency Provider Last Rate Last Admin   0.9 %  sodium chloride infusion  500 mL Intravenous Once Danis, Estill Cotta III, MD        PHYSICAL  EXAMINATION: ECOG PERFORMANCE STATUS: 1 - Symptomatic but completely ambulatory  Vitals:   04/25/21 0846  BP: 128/80  Pulse: 73  Resp: 17  Temp: 98.1 F (36.7 C)  SpO2: 100%   Wt Readings from Last 3 Encounters:  04/25/21 45.8 kg  04/11/21 44.6 kg  03/28/21 45 kg     GENERAL:alert, no distress and comfortable SKIN: skin color normal, no rashes or significant lesions EYES: normal, Conjunctiva are pink and non-injected, sclera clear  NEURO: alert & oriented x 3 with fluent speech  LABORATORY DATA:  I have reviewed the data as listed CBC Latest Ref Rng & Units 04/25/2021 04/11/2021 03/28/2021  WBC 4.0 - 10.5 K/uL 3.1(L) 7.0 2.8(L)  Hemoglobin 12.0 - 15.0 g/dL 9.3(L) 9.8(L) 9.7(L)  Hematocrit 36.0 - 46.0 % 30.0(L) 32.3(L) 31.6(L)  Platelets 150 - 400 K/uL 209 144(L) 203     CMP Latest Ref Rng & Units 04/25/2021 04/11/2021 03/28/2021  Glucose 70 - 99 mg/dL 87 83 104(H)  BUN 8 - 23 mg/dL _0 Creatinine 0.44 - 1.00 mg/dL 0.73 0.66 0.75  Sodium 135 - 145 mmol/L 139 137 139  Potassium 3.5 - 5.1 mmol/L 4.3 4.1 4.1  Chloride 98 - 111 mmol/L 105 105 107  CO2 22 - 32 mmol/L _1 Calcium 8.9 - 10.3 mg/dL 9.2 8.8(L) 9.0  Total Protein 6.5 - 8.1 g/dL 6.7 6.7 6.6  Total Bilirubin 0.3 - 1.2 mg/dL 0.4 0.5 0.3  Alkaline Phos 38 - 126 U/L 60 75 61  AST 15 - 41 U/L _2 ALT 0 - 44 U/L _3 RADIOGRAPHIC STUDIES: I have personally reviewed the radiological images as listed and agreed with the findings in the report. No results found.    No orders of the defined types were placed in this encounter.  All questions were answered. The patient knows to call the clinic with any problems, questions or concerns. No barriers to learning was detected. The total time spent in the appointment was 30 minutes.     [name], MD 04/25/2021   I, Wilburn Mylar, am acting as scribe for [name], MD.   I have reviewed the above documentation for accuracy and  completeness, and I agree with the above.

## 2021-04-25 NOTE — Progress Notes (Signed)
Increase Bevacizumab to 225mg  based on current weight per MD.  Acquanetta Belling, RPH, BCPS, BCOP 04/25/2021 11:21 AM

## 2021-04-26 ENCOUNTER — Ambulatory Visit (INDEPENDENT_AMBULATORY_CARE_PROVIDER_SITE_OTHER): Payer: Federal, State, Local not specified - PPO | Admitting: Ophthalmology

## 2021-04-26 ENCOUNTER — Encounter (INDEPENDENT_AMBULATORY_CARE_PROVIDER_SITE_OTHER): Payer: Self-pay | Admitting: Ophthalmology

## 2021-04-26 DIAGNOSIS — H43822 Vitreomacular adhesion, left eye: Secondary | ICD-10-CM

## 2021-04-26 DIAGNOSIS — H35341 Macular cyst, hole, or pseudohole, right eye: Secondary | ICD-10-CM

## 2021-04-26 NOTE — Progress Notes (Signed)
04/26/2021     CHIEF COMPLAINT Patient presents for  Chief Complaint  Patient presents with   Post-op Follow-up      HISTORY OF PRESENT ILLNESS: Brittney Spencer is a 70 y.o. female who presents to the clinic today for:   HPI     Post-op Follow-up   In right eye.  Discomfort includes Negative for pain, itching, foreign body sensation (Pt states "it feels like there is something in it."), tearing and discharge.        Comments   6 week PO OD oct sx 03/07/2021. "It was a little itchy, I don't touch my eyes. The gas looks like it has gone away. I completed the drops as well."  Denies new FOL or floaters.       Last edited by Laurin Coder on 04/26/2021  8:06 AM.      Referring physician: No referring provider defined for this encounter.  HISTORICAL INFORMATION:   Selected notes from the MEDICAL RECORD NUMBER       CURRENT MEDICATIONS: No current outpatient medications on file. (Ophthalmic Drugs)   No current facility-administered medications for this visit. (Ophthalmic Drugs)   Current Outpatient Medications (Other)  Medication Sig   ALPRAZolam (XANAX) 0.25 MG tablet Take 1 tablet (0.25 mg total) by mouth daily as needed for anxiety.   Cholecalciferol (VITAMIN D3 PO) Take by mouth daily.   diphenoxylate-atropine (LOMOTIL) 2.5-0.025 MG tablet Take 2 tablets by mouth 4 (four) times daily as needed for diarrhea or loose stools.   docusate sodium (COLACE) 100 MG capsule 1 capsule as needed   lidocaine-prilocaine (EMLA) cream Apply 1 application topically as needed.   NORVASC 2.5 MG tablet TAKE 1 TABLET BY MOUTH EVERY DAY   ondansetron (ZOFRAN) 8 MG tablet Take 1 tablet (8 mg total) by mouth every 8 (eight) hours as needed for nausea or vomiting.   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 Some residual intraretinal fluid remains, very small localized subfoveal of fluid remains although this continues to slowly resorb. We'll continue to observe.   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   Resolved March 2021 post vitrectomy   Past Surgical History:  Procedure Laterality Date   COLONOSCOPY  2018   [facility]   COLONSCOPY  12/2015   IR IMAGING GUIDED PORT INSERTION  08/04/2020   LAPAROSCOPIC RIGHT HEMI COLECTOMY Right 02/07/2016   Procedure: LAPAROSCOPIC ASSISTED  RIGHT HEMI COLECTOMY AND RIGHT SALPINGO OOPHERECTOMY;  Surgeon: [name], MD;  Location: [facility];  Service: General;  Laterality: Right;   RETINAL DETACHMENT SURGERY  09/2019    FAMILY HISTORY Family History  Problem Relation Age of Onset   Cancer Mother        lung cancer   Cancer Maternal Grandfather        unknown cancer    Colon cancer Neg Hx    Esophageal cancer Neg Hx    Rectal cancer Neg Hx    Stomach cancer Neg Hx    Colon polyps Neg Hx     SOCIAL HISTORY Social History   Tobacco Use   Smoking status: Never   Smokeless tobacco: Never  Vaping Use   Vaping Use: Never used  Substance Use Topics   Alcohol use: No     Alcohol/week: 0.0 standard drinks   Drug use: No         OPHTHALMIC EXAM:  Base Eye Exam     Visual Acuity (ETDRS)       Right Left   Dist cc 20/60 -2 20/30 +1   Dist ph cc NI     Correction: Glasses         Tonometry (Tonopen, 8:11 AM)       Right Left   Pressure 12 15         Pupils       Pupils Dark Light APD   Right PERRL 4 3 None   Left PERRL 4 3 None         Extraocular Movement       Right Left    Full Full         Neuro/Psych     Oriented x3: Yes   Mood/Affect: Normal         Dilation     Right eye: 1.0% Mydriacyl, 2.5% Phenylephrine @ 8:11 AM           Slit Lamp and Fundus Exam     External Exam       Right Left   External Normal Normal         Slit Lamp Exam       Right Left   Lids/Lashes Normal Normal   Conjunctiva/Sclera White and quiet White and quiet   Cornea Clear Clear   Anterior Chamber Deep and quiet Deep and quiet   Iris Round and reactive Round and reactive   Lens Posterior chamber intraocular lens 2+ Nuclear sclerosis   Anterior Vitreous Normal Normal         Fundus Exam       Right Left   Posterior Vitreous Vitrectomized, clear    Disc Normal    C/D Ratio 0.2    Macula Macular hole closed, edges flat ,coapted    Vessels Normal    Periphery Normal             IMAGING AND PROCEDURES  Imaging and Procedures for 04/26/21  OCT, Retina - OU - Both Eyes       Right Eye Quality was good. Scan locations included subfoveal. Central Foveal Thickness: 265. Progression has improved. Findings include abnormal foveal contour.   Left Eye Quality was good. Central Foveal Thickness: 282. Progression has been stable. Findings include epiretinal membrane, vitreomacular adhesion .   Notes Now some 6 weeks post vitrectomy membrane peel for macular hole with 20\400 vision preoperative now with macular hole closed, with improved acuity and improving photoreceptor layer  OS VMT remains              ASSESSMENT/PLAN:  Macular hole of right eye Looks great OD post vitrectomy membrane peel for macular hole, with improved acuity from 20/400 now to 20/60  Vitreomacular traction, left OS minor persistent no impact on acuity      ICD-10-CM   1. Macular hole of right eye  H35.341 OCT, Retina - OU - Both Eyes    2. Vitreomacular traction, left  H43.822       1.  OD looks great post vitrectomy membrane peel for macular hole.  Proved acuity from 20/400 now to 20/60  2.  Patient may resume full activities if not already done so.  3.  Patient to monitor the left eye for new onset visual acuity changes from VMT  Ophthalmic Meds Ordered this visit:  No orders of the defined types were placed in this encounter.      Return in about 1 year (around 04/26/2022) for DILATE OU, OCT.  There are no Patient Instructions on file for this visit.   Explained the diagnoses, plan, and follow up with the patient and they expressed understanding.  Patient expressed understanding of the importance of proper follow up care.   Clent Demark Menelik Mcfarren M.D. Diseases & Surgery of the Retina and Vitreous [facility] [place] 04/26/21     Abbreviations: M myopia (nearsighted); A astigmatism; H hyperopia (farsighted); P presbyopia; Mrx spectacle prescription;  CTL contact lenses; OD right eye; OS left eye; OU both eyes  XT exotropia; ET esotropia; PEK punctate epithelial keratitis; PEE punctate epithelial erosions; DES dry eye syndrome; MGD meibomian gland dysfunction; ATs artificial tears; PFAT's preservative free artificial tears; [place] nuclear sclerotic cataract; PSC posterior subcapsular cataract; ERM epi-retinal membrane; PVD posterior vitreous detachment; RD retinal detachment; DM diabetes mellitus; DR diabetic retinopathy; NPDR non-proliferative diabetic retinopathy; PDR proliferative diabetic retinopathy; CSME clinically significant macular edema; DME diabetic macular edema; dbh dot blot  hemorrhages; CWS cotton wool spot; POAG primary open angle glaucoma; C/D cup-to-disc ratio; HVF humphrey visual field; GVF goldmann visual field; OCT optical coherence tomography; IOP intraocular pressure; BRVO Branch retinal vein occlusion; CRVO central retinal vein occlusion; CRAO central retinal artery occlusion; BRAO branch retinal artery occlusion; RT retinal tear; SB scleral buckle; PPV pars plana vitrectomy; VH Vitreous hemorrhage; PRP panretinal laser photocoagulation; IVK intravitreal kenalog; VMT vitreomacular traction; MH Macular hole;  NVD neovascularization of the disc; NVE neovascularization elsewhere; AREDS age related eye disease study; ARMD age related macular degeneration; POAG primary open angle glaucoma; EBMD epithelial/anterior basement membrane dystrophy; ACIOL anterior chamber intraocular lens; IOL intraocular lens; PCIOL posterior chamber intraocular lens; Phaco/IOL phacoemulsification with intraocular lens placement; [place] photorefractive keratectomy; LASIK laser assisted in situ keratomileusis; HTN hypertension; DM diabetes mellitus; COPD chronic obstructive pulmonary disease

## 2021-04-26 NOTE — Assessment & Plan Note (Signed)
Looks great OD post vitrectomy membrane peel for macular hole, with improved acuity from 20/400 now to 20/60

## 2021-04-26 NOTE — Assessment & Plan Note (Signed)
OS minor persistent no impact on acuity

## 2021-04-28 ENCOUNTER — Other Ambulatory Visit: Payer: Self-pay | Admitting: Hematology

## 2021-04-28 DIAGNOSIS — C182 Malignant neoplasm of ascending colon: Secondary | ICD-10-CM

## 2021-05-08 MED FILL — Dexamethasone Sodium Phosphate Inj 100 MG/10ML: INTRAMUSCULAR | Qty: 1 | Status: AC

## 2021-05-08 NOTE — Progress Notes (Signed)
[place]   Telephone:([phone]) [phone] Fax:([phone]) [phone]   Clinic Follow up Note   Patient Care Team: Patient, No Pcp Per (Inactive) as PCP - General (General Practice) [name], NP as Nurse Practitioner (Oncology) [name], MD as Consulting Physician (Oncology) [name], RN (Inactive) as Oncology Nurse Navigator 05/09/2021  CHIEF COMPLAINT: Follow-up metastatic rectal cancer and history of colon cancer  SUMMARY OF ONCOLOGIC HISTORY: Oncology History Overview Note  Cancer Staging Cancer of ascending colon [facility]) Staging form: Colon and Rectum, AJCC 7th Edition - Clinical stage from 02/07/2016: Stage IIIC (T4b, N1b, M0) - Signed by [name], MD on 03/04/2016 Laterality: Right Residual tumor (R): R2 - Macroscopic    Cancer of ascending colon ([facility])  10/25/2015 Imaging   CT ABD/PELVIS:  Inflammatory changes inferior to the cecal tip appear improved, there is still irregular soft tissue thickening of the cecal tip, and there are adjacent prominent lymph nodes in the ileocolonic mesentery, measuring 13 mm on image 49 and 8 mm on image 52. In addition, there is a 2.5 x 1.8 cm nodule on image 46 which has central low density. Therefore, these findings are moderately suspicious for an underlying cecal malignancy with perforation.    01/19/2016 Procedure   COLONOSCOPY per Dr. [name]: Fungating, ulcerated mass almost obstructing mid ascending colon   01/19/2016 Initial Biopsy   Diagnosis Surgical [P], cecal mass - INVASIVE ADENOCARCINOMA WITH ULCERATION. - SEE COMMENT.   02/05/2016 Tumor Marker   Patient's tumor was tested for the following markers: CEA Results of the tumor marker test revealed 5.7.   02/07/2016 Initial Diagnosis   Cancer of ascending colon ([place] Spencer)   02/07/2016 Definitive Surgery   Laparoscopic assisted right hemicolectomy and right salpingo oopherectomy--Dr. [name]   02/07/2016 Pathologic Stage   p T4 N1b   2/43 nodes +   02/07/2016  Pathology Results   MMR normal; G2 adenocarcinoma;proximal & distal margins negative; soft tissue mass on pelvic sidewall + for adenocarcinoma with positive margin MSI Stable   03/08/2016 Imaging   CT chest negative for metastasis.    03/19/2016 - 04/25/2016 Radiation Therapy   Adjuvant irradiation, 50 gray in 28 fractions   03/19/2016 - 04/22/2016 Chemotherapy   Xeloda 1500 mg twice daily, started on 03/19/2016, dose reduced to 1000 mg twice daily from week 3 due to neutropenia, and patient stopped 3 days before last dose radiation due to difficulty swallowing the pill    05/20/2016 -  Adjuvant Chemotherapy   Patient declined adjuvant chemotherapy   09/16/2016 Imaging   CT CAP w Contrast 1. No evidence of local tumor recurrence at the ileocolic anastomosis. 2. No findings suspicious for metastatic disease in the chest, abdomen or pelvis. 3. Nonspecific trace free fluid in the pelvic cul-de-sac. 4. Stable solitary 3 mm right upper lobe pulmonary nodule, for which 6 month stability has been demonstrated, probably benign. 5. Additional findings include stable right posterior pericardial cyst and small calcified uterine fibroids.   05/13/2017 Imaging   CT CAP W Contrast 05/13/17 IMPRESSION: 1. No current findings of residual or recurrent malignancy. 2. Mild prominence of stool throughout the colon. Nondistended portions of the rectum. 3. Several tiny pulmonary nodules are stable from the earliest available comparison of 03/08/2016 and probably benign, but may merit surveillance. 4. Other imaging findings of potential clinical significance: Old granulomatous disease. Aortoiliac atherosclerotic vascular disease. Lumbar spondylosis and degenerative disc disease. Stable amount of trace free pelvic fluid.   04/27/2018 Imaging   04/27/2018 CT  CAP IMPRESSION: Stable exam. No evidence of recurrent or metastatic carcinoma within the chest, abdomen, or pelvis   04/19/2019 Imaging   CT  CAP W Contrast  IMPRESSION: Chest Impression:   1. No evidence of thoracic metastasis. 2. Stable small bilateral pulmonary nodules.   Abdomen / Pelvis Impression:   1. No evidence local colorectal carcinoma recurrence or metastasis in the abdomen pelvis. 2. Post RIGHT hemicolectomy.   01/22/2021 Imaging   CT CAP  IMPRESSION: CT CHEST IMPRESSION   1. Similar nonspecific pulmonary nodules. 2. New posterior left upper lobe reticulonodular opacity, suspicious for interval mild infection or inflammation. 3. No thoracic adenopathy.   CT ABDOMEN AND PELVIS IMPRESSION   1. Further decrease in size of high left hepatic lobe 3 mm low-density lesion. No new or progressive metastatic disease within the abdomen or pelvis. 2. Similar trace free pelvic fluid. 3. Similar nonspecific mid rectal wall thickening. 4.  Aortic Atherosclerosis (ICD10-I70.0).   04/23/2021 Imaging   CT CAP  IMPRESSION: 1. Treated metastatic lesion between segments 2 and 3 of the liver, slightly smaller and less distinct than prior examination. No other signs of definite metastatic disease elsewhere in the abdomen or pelvis. 2. Multiple small pulmonary nodules, stable compared to the prior examination, favored to be benign. No definitive findings to suggest metastatic disease to the thorax. 3. Aortic atherosclerosis. 4. Additional incidental findings, as above.   Rectal cancer metastasized to liver (Brittney Spencer)  06/15/2020 Procedure   Screening Colonoscopy by Dr [name]  IMPRESSION - Decreased sphincter tone and internal hemorrhoids that prolapse with straining, but require manual replacement into the anal canal (Grade III) found on digital rectal exam. - Patent side-to-side ileo-colonic anastomosis, characterized by healthy appearing mucosa. - The examined portion of the ileum was normal. - One diminutive polyp in the proximal transverse colon, removed with a cold biopsy forceps. Resected and retrieved. - Likely  malignant partially obstructing tumor in the mid rectum. Biopsied. Tattooed. - The examination was otherwise normal on direct and retroflexion views.   06/15/2020 Initial Biopsy   Diagnosis 1. Transverse Colon Polyp - HYPERPLASTIC POLYP 2. Rectum, biopsy - ADENOCARCINOMA ARISING IN A TUBULAR ADENOMA WITH HIGH-GRADE DYSPLASIA. SEE NOTE Diagnosis Note 2. Dr. [name] reviewed the case and concurs with the diagnosis. Dr. [name] was notified on 06/16/2020.   06/28/2020 Imaging   CT CAP  IMPRESSION: 1. New low-density focus in the anterior aspect of the lateral segment LEFT hepatic lobe measuring 1.2 x 1.0 cm, compatible with small metastatic lesion in the LEFT hepatic lobe. 2. Soft tissue in the RIGHT iliac fossa following RIGHT hemicolectomy invades the psoas musculature and is slowly enlarging over time, more linear on the prior study now highly concerning for recurrence/metastasis to this location. 3. Signs of enteritis, potentially post radiation changes of the small bowel. Tethered small bowel in the RIGHT lower quadrant shows focal thickening and narrowing suspicious for small bowel involvement and developing partial obstruction though currently contrast passes beyond this point into the colon. 4. Rectal thickening in this patient with known rectal mass as described. 5. No evidence of metastatic disease in the chest. 6. Stable small pulmonary nodules. 7.  and aortic atherosclerosis.   Aortic Atherosclerosis (ICD10-I70.0) and Emphysema (ICD10-J43.9).   07/04/2020 Initial Diagnosis   Rectal cancer metastasized to liver ([place])   07/12/2020 PET scan   IMPRESSION: 1. Exam positive for FDG avid rectal tumor which corresponds to the recent colonoscopy findings. 2. FDG avid soft tissue mass within the right iliac fossa  is noted and consistent with local tumor recurrence from previous ascending colon tumor. 3. Lateral segment left lobe of liver lesion is FDG avid concerning for liver  metastasis. 4. No specific findings identified to suggest metastatic disease to the chest.   08/10/2020 -  Chemotherapy   First-line FOLFIRI q2weeks starting 08/10/20. dose reduced with cycle 1. Irinotecan/5FU increased and Bevacizumab added with cycle 2 on 08/23/2020    10/27/2020 Imaging   CT CAP  IMPRESSION: 1. Interval decrease in size of the hypermetabolic left hepatic lesion, consistent with metastatic disease. No new liver lesion evident. 2. Interval resolution of the hypermetabolic soft tissue lesion along the right iliac fossa with no measurable soft tissue lesion remaining at this location today. 3. Similar appearance of soft tissue fullness in the rectum at the site of the hypermetabolic lesion seen previously. 4. Stable tiny bilateral pulmonary nodules. Continued attention on follow-up recommended. 5. Small volume free fluid in the pelvis. 6. Aortic Atherosclerosis (ICD10-I70.0).   01/22/2021 Imaging   CT CAP  IMPRESSION: CT CHEST IMPRESSION   1. Similar nonspecific pulmonary nodules. 2. New posterior left upper lobe reticulonodular opacity, suspicious for interval mild infection or inflammation. 3. No thoracic adenopathy.   CT ABDOMEN AND PELVIS IMPRESSION   1. Further decrease in size of high left hepatic lobe 3 mm low-density lesion. No new or progressive metastatic disease within the abdomen or pelvis. 2. Similar trace free pelvic fluid. 3. Similar nonspecific mid rectal wall thickening. 4.  Aortic Atherosclerosis (ICD10-I70.0).   04/23/2021 Imaging   CT CAP  IMPRESSION: 1. Treated metastatic lesion between segments 2 and 3 of the liver, slightly smaller and less distinct than prior examination. No other signs of definite metastatic disease elsewhere in the abdomen or pelvis. 2. Multiple small pulmonary nodules, stable compared to the prior examination, favored to be benign. No definitive findings to suggest metastatic disease to the thorax. 3. Aortic  atherosclerosis. 4. Additional incidental findings, as above.     CURRENT THERAPY: Maintenance irinotecan and bevacizumab from 01/24/21  INTERVAL HISTORY: Ms. Esses returns for follow-up and treatment as scheduled.  Last seen 04/25/2021 and completed another cycle of maintenance irinotecan and bevacizumab.  She is doing well in general, remains conscious of her oral intake.  She manages intermittent constipation with fiber and MiraLAX if needed.  Occasional diet-related nausea resolved with medication, no vomiting.  She has occasional leg pain that resolves if she does some exercise/walking.  She has discoloration in the hands, denies neuropathy.  Denies worsening pain, fever, chills, cough, chest pain, dyspnea, or other new complaints.    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 Some residual intraretinal fluid remains, very small localized subfoveal of fluid remains although this continues to slowly resorb. We'll continue to observe.   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  Resolved March 2021 post vitrectomy    SURGICAL HISTORY: Past Surgical History:  Procedure Laterality Date   COLONOSCOPY  2018   [facility]   COLONSCOPY  12/2015   IR IMAGING GUIDED PORT INSERTION  08/04/2020   LAPAROSCOPIC RIGHT HEMI COLECTOMY Right 02/07/2016   Procedure: LAPAROSCOPIC ASSISTED RIGHT HEMI COLECTOMY AND RIGHT SALPINGO  OOPHERECTOMY;  Surgeon: [name], MD;  Location: [facility];  Service: General;  Laterality: Right;   RETINAL DETACHMENT SURGERY  09/2019    I have reviewed the social history and family history with the patient and they are unchanged from previous note.  ALLERGIES:  is allergic to fish allergy, peanut-containing drug products, soy allergy, and buspirone.  MEDICATIONS:  Current Outpatient Medications  Medication Sig Dispense Refill   ALPRAZolam (XANAX) 0.25 MG tablet Take 1 tablet (0.25 mg total) by mouth daily as needed for anxiety. 30 tablet 0   Cholecalciferol (VITAMIN D3 PO) Take by mouth daily.     diphenoxylate-atropine (LOMOTIL) 2.5-0.025 MG tablet Take 2 tablets by mouth 4 (four) times daily as needed for diarrhea or loose stools. 45 tablet 3   docusate sodium (COLACE) 100 MG capsule 1 capsule as needed     lidocaine-prilocaine (EMLA) cream Apply 1 application topically as needed. 30 g 1   NORVASC 2.5 MG tablet TAKE 1 TABLET BY MOUTH EVERY DAY 90 tablet 1   ondansetron (ZOFRAN) 8 MG tablet Take 1 tablet (8 mg total) by mouth every 8 (eight) hours as needed for nausea or vomiting. 20 tablet 2   Current Facility-Administered Medications  Medication Dose Route Frequency Provider Last Rate Last Admin   0.9 %  sodium chloride infusion  500 mL Intravenous Once Doran Stabler, MD       Facility-Administered Medications Ordered in Other Visits  Medication Dose Route Frequency Provider Last Rate Last Admin   heparin lock flush 100 unit/mL  500 Units Intracatheter Once PRN [name], MD       irinotecan (CAMPTOSAR) 180 mg in sodium chloride 0.9 % 500 mL chemo infusion  130 mg/m2 (Order-Specific) Intravenous Once [name], MD 339 mL/hr at 05/09/21 1242 180 mg at 05/09/21 1242   sodium chloride flush (NS) 0.9 % injection 10 mL  10 mL Intracatheter PRN [name], MD        PHYSICAL EXAMINATION: ECOG PERFORMANCE STATUS: 1 - Symptomatic but completely ambulatory  Vitals:    05/09/21 0949  BP: 133/78  Pulse: 84  Resp: 17  Temp: 98.6 F (37 C)  SpO2: 99%   Filed Weights   05/09/21 0949  Weight: 100 lb 8 oz (45.6 kg)    GENERAL:alert, no distress and comfortable SKIN: No rash, Palms with mild hyperpigmentation EYES: sclera clear LUNGS:  normal breathing effort HEART: no lower extremity edema NEURO: alert & oriented x 3 with fluent speech, no focal motor/sensory deficits  LABORATORY DATA:  I have reviewed the data as listed CBC Latest Ref Rng & Units 05/09/2021 04/25/2021 04/11/2021  WBC 4.0 - 10.5 K/uL 2.8(L) 3.1(L) 7.0  Hemoglobin 12.0 - 15.0 g/dL 9.3(L) 9.3(L) 9.8(L)  Hematocrit 36.0 - 46.0 % 29.8(L) 30.0(L) 32.3(L)  Platelets 150 - 400 K/uL 155 209 144(L)     CMP Latest Ref Rng & Units 05/09/2021 04/25/2021 04/11/2021  Glucose 70 - 99 mg/dL 85 87 83  BUN 8 - 23 mg/dL '16 15 17  ' Creatinine 0.44 - 1.00 mg/dL 0.72 0.73 0.66  Sodium 135 - 145 mmol/L 138 139 137  Potassium 3.5 -  5.1 mmol/L 4.1 4.3 4.1  Chloride 98 - 111 mmol/L 107 105 105  CO2 22 - 32 mmol/L '25 24 24  ' Calcium 8.9 - 10.3 mg/dL 8.6(L) 9.2 8.8(L)  Total Protein 6.5 - 8.1 g/dL 6.7 6.7 6.7  Total Bilirubin 0.3 - 1.2 mg/dL 0.3 0.4 0.5  Alkaline Phos 38 - 126 U/L 58 60 75  AST 15 - 41 U/L '18 16 17  ' ALT 0 - 44 U/L '7 6 11      ' RADIOGRAPHIC STUDIES: I have personally reviewed the radiological images as listed and agreed with the findings in the report. No results found.   ASSESSMENT & PLAN: Brittney Spencer is a 70 y.o. female with   1. Rectal adenocarcinoma, with liver and right pelvic metastasis, recurrence from previous colon cancer vs new primary   -Diagnosed on routine screening colonoscopy 05/2020, rectal mass biopsy showed invasive adenocarcinoma, arising from a tubular adenoma -Her 07/12/20 PET showed positive uptake of known rectal tumor and soft tissue mass within the right iliac fossa indicating recurrent prior colon cancer. There is also left lobe of liver lesion is  FDG avid concerning for liver metastasis. pt declined liver biopsy -whether this is metastatic rectal or recurrent/metastatic colon cancer, this is likely not curable, but still treatable. She previously declined referral for HIPEC -her FO is still pending, will see if she is eligible for EGFR inhibitor -She began first-line systemic chemo with dose-reduced FOLFIRI on 08/06/20. -Bevacizumab added, and irinotecan/5FU increased with cycle 2, but due to poor tolerance/diarrhea irinotecan was reduced with cycle 3 to 130 mg per metered squared, tolerating well -CT CAP 01/22/2021 showed continued good response to treatment, decreased size and liver metastasis, and indeterminate left upper lobe ground Glass lung nodule.  Tumor marker has normalized on treatment -She changed to maintenance therapy with irinotecan and bevacizumab every 2 weeks -Restaging CT CAP 04/23/2021 showed good partial response, continue current treatment   2. Cancer of ascending colon, pT4bN1bM0, stage IIIC, MSI-stable, (+) surgical margins at pelvic wall     -She was diagnosed in 12/2015. She is s/p right hemicolectomy with right salpingo oophorectomy and  adjuvant ChemoRT. -she declined adjuvant chemo due to the concern of side effects and impact on her quality of life. -Her 04/2019 CT scan was NED  -With recent rectal cancer diagnosis, her PET from 07/12/20 indicates soft tissue mass within the right iliac fossa is noted and consistent with local tumor recurrence from previous ascending colon tumor. -She declined option of liver biopsy.     3. Mild Anemia -Has been stable and mild for the past year.  -worse lately due to rectal cancer and starting chemo -denies bleeding -monitoring   4. HTN, Anxiety -She'll follow-up with her primary care physician and continue medication -She uses half tab Xanax as needed, mostly only on treatment days. refilled -stable, not worse since her recent rectal cancer diagnosis   Disposition: Ms.  Twitty appears stable.  She continues maintenance irinotecan and bevacizumab every 2 weeks.  She tolerates treatment well with minimal side effects and good performance status.  She is able to recover and function well.  There is no clinical evidence of disease progression.  Labs reviewed, adequate to proceed with another cycle of irinotecan and bevacizumab today as planned.  ANC 1.6, she will receive G-CSF with this cycle.  She will follow-up with treatment in 4 and 8 weeks due to the upcoming holidays.  All questions were answered. The patient knows to call the clinic  with any problems, questions or concerns. No barriers to learning were detected.     [name], NP 05/09/21

## 2021-05-09 ENCOUNTER — Encounter: Payer: Self-pay | Admitting: Nurse Practitioner

## 2021-05-09 ENCOUNTER — Encounter: Payer: Self-pay | Admitting: Hematology

## 2021-05-09 ENCOUNTER — Inpatient Hospital Stay: Payer: Federal, State, Local not specified - PPO

## 2021-05-09 ENCOUNTER — Inpatient Hospital Stay: Payer: Federal, State, Local not specified - PPO | Attending: Nurse Practitioner | Admitting: Nurse Practitioner

## 2021-05-09 ENCOUNTER — Other Ambulatory Visit: Payer: Self-pay

## 2021-05-09 VITALS — BP 133/78 | HR 84 | Temp 98.6°F | Resp 17 | Wt 100.5 lb

## 2021-05-09 DIAGNOSIS — F419 Anxiety disorder, unspecified: Secondary | ICD-10-CM | POA: Diagnosis not present

## 2021-05-09 DIAGNOSIS — Z95828 Presence of other vascular implants and grafts: Secondary | ICD-10-CM

## 2021-05-09 DIAGNOSIS — C787 Secondary malignant neoplasm of liver and intrahepatic bile duct: Secondary | ICD-10-CM

## 2021-05-09 DIAGNOSIS — Z1509 Genetic susceptibility to other malignant neoplasm: Secondary | ICD-10-CM | POA: Diagnosis not present

## 2021-05-09 DIAGNOSIS — C20 Malignant neoplasm of rectum: Secondary | ICD-10-CM

## 2021-05-09 DIAGNOSIS — I10 Essential (primary) hypertension: Secondary | ICD-10-CM | POA: Diagnosis not present

## 2021-05-09 DIAGNOSIS — E876 Hypokalemia: Secondary | ICD-10-CM | POA: Diagnosis not present

## 2021-05-09 DIAGNOSIS — Z5112 Encounter for antineoplastic immunotherapy: Secondary | ICD-10-CM | POA: Insufficient documentation

## 2021-05-09 DIAGNOSIS — D649 Anemia, unspecified: Secondary | ICD-10-CM | POA: Insufficient documentation

## 2021-05-09 DIAGNOSIS — C182 Malignant neoplasm of ascending colon: Secondary | ICD-10-CM

## 2021-05-09 DIAGNOSIS — Z79899 Other long term (current) drug therapy: Secondary | ICD-10-CM | POA: Diagnosis not present

## 2021-05-09 DIAGNOSIS — Z182 Retained plastic fragments: Secondary | ICD-10-CM | POA: Diagnosis not present

## 2021-05-09 DIAGNOSIS — Z923 Personal history of irradiation: Secondary | ICD-10-CM | POA: Diagnosis not present

## 2021-05-09 DIAGNOSIS — Z5111 Encounter for antineoplastic chemotherapy: Secondary | ICD-10-CM | POA: Insufficient documentation

## 2021-05-09 DIAGNOSIS — Z5189 Encounter for other specified aftercare: Secondary | ICD-10-CM | POA: Diagnosis not present

## 2021-05-09 DIAGNOSIS — Z9221 Personal history of antineoplastic chemotherapy: Secondary | ICD-10-CM | POA: Insufficient documentation

## 2021-05-09 LAB — CMP (CANCER CENTER ONLY)
ALT: 7 U/L (ref 0–44)
AST: 18 U/L (ref 15–41)
Albumin: 3.3 g/dL — ABNORMAL LOW (ref 3.5–5.0)
Alkaline Phosphatase: 58 U/L (ref 38–126)
Anion gap: 6 (ref 5–15)
BUN: 16 mg/dL (ref 8–23)
CO2: 25 mmol/L (ref 22–32)
Calcium: 8.6 mg/dL — ABNORMAL LOW (ref 8.9–10.3)
Chloride: 107 mmol/L (ref 98–111)
Creatinine: 0.72 mg/dL (ref 0.44–1.00)
GFR, Estimated: >60 mL/min (ref >60)
Glucose, Bld: 85 mg/dL (ref 70–99)
Potassium: 4.1 mmol/L (ref 3.5–5.1)
Sodium: 138 mmol/L (ref 135–145)
Total Bilirubin: 0.3 mg/dL (ref 0.3–1.2)
Total Protein: 6.7 g/dL (ref 6.5–8.1)

## 2021-05-09 LAB — CBC WITH DIFFERENTIAL (CANCER CENTER ONLY)
Abs Immature Granulocytes: 0 10*3/uL (ref 0.00–0.07)
Basophils Absolute: 0 10*3/uL (ref 0.0–0.1)
Basophils Relative: 0 %
Eosinophils Absolute: 0.1 10*3/uL (ref 0.0–0.5)
Eosinophils Relative: 3 %
HCT: 29.8 % — ABNORMAL LOW (ref 36.0–46.0)
Hemoglobin: 9.3 g/dL — ABNORMAL LOW (ref 12.0–15.0)
Immature Granulocytes: 0 %
Lymphocytes Relative: 26 %
Lymphs Abs: 0.7 10*3/uL (ref 0.7–4.0)
MCH: 26.8 pg (ref 26.0–34.0)
MCHC: 31.2 g/dL (ref 30.0–36.0)
MCV: 85.9 fL (ref 80.0–100.0)
Monocytes Absolute: 0.4 10*3/uL (ref 0.1–1.0)
Monocytes Relative: 13 %
Neutro Abs: 1.6 10*3/uL — ABNORMAL LOW (ref 1.7–7.7)
Neutrophils Relative %: 58 %
Platelet Count: 155 10*3/uL (ref 150–400)
RBC: 3.47 MIL/uL — ABNORMAL LOW (ref 3.87–5.11)
RDW: 15.4 % (ref 11.5–15.5)
WBC Count: 2.8 10*3/uL — ABNORMAL LOW (ref 4.0–10.5)
nRBC: 0 % (ref 0.0–0.2)

## 2021-05-09 LAB — TOTAL PROTEIN, URINE DIPSTICK: Protein, ur: NEGATIVE mg/dL

## 2021-05-09 MED ORDER — SODIUM CHLORIDE 0.9 % IV SOLN
5.0000 mg/kg | Freq: Once | INTRAVENOUS | Status: AC
  Administered 2021-05-09: 225 mg via INTRAVENOUS
  Filled 2021-05-09: qty 9

## 2021-05-09 MED ORDER — ATROPINE SULFATE 1 MG/ML IV SOLN
0.5000 mg | Freq: Once | INTRAVENOUS | Status: AC | PRN
  Administered 2021-05-09: 0.5 mg via INTRAVENOUS
  Filled 2021-05-09: qty 1

## 2021-05-09 MED ORDER — SODIUM CHLORIDE 0.9 % IV SOLN
10.0000 mg | Freq: Once | INTRAVENOUS | Status: AC
  Administered 2021-05-09: 10 mg via INTRAVENOUS
  Filled 2021-05-09: qty 10

## 2021-05-09 MED ORDER — PALONOSETRON HCL INJECTION 0.25 MG/5ML
0.2500 mg | Freq: Once | INTRAVENOUS | Status: AC
  Administered 2021-05-09: 0.25 mg via INTRAVENOUS
  Filled 2021-05-09: qty 5

## 2021-05-09 MED ORDER — HEPARIN SOD (PORK) LOCK FLUSH 100 UNIT/ML IV SOLN
500.0000 [IU] | Freq: Once | INTRAVENOUS | Status: AC | PRN
  Administered 2021-05-09: 500 [IU]

## 2021-05-09 MED ORDER — SODIUM CHLORIDE 0.9% FLUSH
10.0000 mL | Freq: Once | INTRAVENOUS | Status: AC
  Administered 2021-05-09: 10 mL

## 2021-05-09 MED ORDER — SODIUM CHLORIDE 0.9% FLUSH
10.0000 mL | INTRAVENOUS | Status: DC | PRN
  Administered 2021-05-09: 10 mL

## 2021-05-09 MED ORDER — SODIUM CHLORIDE 0.9 % IV SOLN: Freq: Once | INTRAVENOUS | Status: AC

## 2021-05-09 MED ORDER — SODIUM CHLORIDE 0.9 % IV SOLN
130.0000 mg/m2 | Freq: Once | INTRAVENOUS | Status: AC
  Administered 2021-05-09: 180 mg via INTRAVENOUS
  Filled 2021-05-09: qty 9

## 2021-05-09 NOTE — Patient Instructions (Signed)
[facility]COL[facility]  Discharge Instructions: Thank you for choosing [facility] to provide your oncology and hematology care.   If you have a lab appointment with the [facility], please go directly to the [facility] and check in at the registration area.   Wear comfortable clothing and clothing appropriate for easy access to any Portacath or PICC line.   We strive to give you quality time with your provider. You may need to reschedule your appointment if you arrive late (15 or more minutes).  Arriving late affects you and other patients whose appointments are after yours.  Also, if you miss three or more appointments without notifying the office, you may be dismissed from the clinic at the provider's discretion.      For prescription refill requests, have your pharmacy contact our office and allow 72 hours for refills to be completed.    Today you received the following chemotherapy and/or immunotherapy agents: Bevacizumab and Irinotecan      To help prevent nausea and vomiting after your treatment, we encourage you to take your nausea medication as directed.  BELOW ARE SYMPTOMS THAT SHOULD BE REPORTED IMMEDIATELY: *FEVER GREATER THAN 100.4 F (38 C) OR HIGHER *CHILLS OR SWEATING *NAUSEA AND VOMITING THAT IS NOT CONTROLLED WITH YOUR NAUSEA MEDICATION *UNUSUAL SHORTNESS OF BREATH *UNUSUAL BRUISING OR BLEEDING *URINARY PROBLEMS (pain or burning when urinating, or frequent urination) *BOWEL PROBLEMS (unusual diarrhea, constipation, pain near the anus) TENDERNESS IN MOUTH AND THROAT WITH OR WITHOUT PRESENCE OF ULCERS (sore throat, sores in mouth, or a toothache) UNUSUAL RASH, SWELLING OR PAIN  UNUSUAL VAGINAL DISCHARGE OR ITCHING   Items with * indicate a potential emergency and should be followed up as soon as possible or go to the Emergency Department if any problems should occur.  Please show the CHEMOTHERAPY ALERT CARD or IMMUNOTHERAPY ALERT  CARD at check-in to the Emergency Department and triage nurse.  Should you have questions after your visit or need to cancel or reschedule your appointment, please contact [facility]  Dept: [phone]  and follow the prompts.  Office hours are 8:00 a.m. to 4:30 p.m. Monday - Friday. Please note that voicemails left after 4:00 p.m. may not be returned until the following business day.  We are closed weekends and major holidays. You have access to a nurse at all times for urgent questions. Please call the main number to the clinic Dept: [phone] and follow the prompts.   For any non-urgent questions, you may also contact your provider using MyChart. We now offer e-Visits for anyone 26 and older to request care online for non-urgent symptoms. For details visit [email or web address].   Also download the MyChart app! Go to the app store, search "MyChart", open the app, select [facility], and log in with your MyChart username and password.  Due to Covid, a mask is required upon entering the hospital/clinic. If you do not have a mask, one will be given to you upon arrival. For doctor visits, patients may have 1 support person aged 80 or older with them. For treatment visits, patients cannot have anyone with them due to current Covid guidelines and our immunocompromised population.

## 2021-05-10 ENCOUNTER — Ambulatory Visit: Payer: Federal, State, Local not specified - PPO

## 2021-05-10 ENCOUNTER — Inpatient Hospital Stay: Payer: Federal, State, Local not specified - PPO

## 2021-05-10 VITALS — BP 118/74 | HR 77 | Temp 99.0°F | Resp 16

## 2021-05-10 DIAGNOSIS — C787 Secondary malignant neoplasm of liver and intrahepatic bile duct: Secondary | ICD-10-CM

## 2021-05-10 DIAGNOSIS — C20 Malignant neoplasm of rectum: Secondary | ICD-10-CM

## 2021-05-10 DIAGNOSIS — Z5112 Encounter for antineoplastic immunotherapy: Secondary | ICD-10-CM | POA: Diagnosis not present

## 2021-05-10 MED ORDER — PEGFILGRASTIM-CBQV 6 MG/0.6ML ~~LOC~~ SOSY
6.0000 mg | PREFILLED_SYRINGE | Freq: Once | SUBCUTANEOUS | Status: AC
  Administered 2021-05-10: 6 mg via SUBCUTANEOUS
  Filled 2021-05-10: qty 0.6

## 2021-05-11 ENCOUNTER — Ambulatory Visit: Payer: Federal, State, Local not specified - PPO

## 2021-06-06 MED FILL — Dexamethasone Sodium Phosphate Inj 100 MG/10ML: INTRAMUSCULAR | Qty: 1 | Status: AC

## 2021-06-07 ENCOUNTER — Inpatient Hospital Stay (HOSPITAL_BASED_OUTPATIENT_CLINIC_OR_DEPARTMENT_OTHER): Payer: Federal, State, Local not specified - PPO | Admitting: Hematology

## 2021-06-07 ENCOUNTER — Inpatient Hospital Stay: Payer: Federal, State, Local not specified - PPO | Attending: Nurse Practitioner

## 2021-06-07 ENCOUNTER — Encounter: Payer: Self-pay | Admitting: Hematology

## 2021-06-07 ENCOUNTER — Inpatient Hospital Stay: Payer: Federal, State, Local not specified - PPO

## 2021-06-07 ENCOUNTER — Other Ambulatory Visit: Payer: Self-pay

## 2021-06-07 VITALS — BP 147/86 | HR 77 | Temp 98.6°F | Resp 17 | Ht 66.0 in | Wt 105.0 lb

## 2021-06-07 DIAGNOSIS — Z5189 Encounter for other specified aftercare: Secondary | ICD-10-CM | POA: Insufficient documentation

## 2021-06-07 DIAGNOSIS — C787 Secondary malignant neoplasm of liver and intrahepatic bile duct: Secondary | ICD-10-CM

## 2021-06-07 DIAGNOSIS — F419 Anxiety disorder, unspecified: Secondary | ICD-10-CM | POA: Insufficient documentation

## 2021-06-07 DIAGNOSIS — Z9221 Personal history of antineoplastic chemotherapy: Secondary | ICD-10-CM | POA: Diagnosis not present

## 2021-06-07 DIAGNOSIS — Z95828 Presence of other vascular implants and grafts: Secondary | ICD-10-CM

## 2021-06-07 DIAGNOSIS — I10 Essential (primary) hypertension: Secondary | ICD-10-CM | POA: Insufficient documentation

## 2021-06-07 DIAGNOSIS — Z5112 Encounter for antineoplastic immunotherapy: Secondary | ICD-10-CM | POA: Insufficient documentation

## 2021-06-07 DIAGNOSIS — Z1509 Genetic susceptibility to other malignant neoplasm: Secondary | ICD-10-CM | POA: Diagnosis not present

## 2021-06-07 DIAGNOSIS — C182 Malignant neoplasm of ascending colon: Secondary | ICD-10-CM

## 2021-06-07 DIAGNOSIS — Z923 Personal history of irradiation: Secondary | ICD-10-CM | POA: Insufficient documentation

## 2021-06-07 DIAGNOSIS — D649 Anemia, unspecified: Secondary | ICD-10-CM | POA: Diagnosis not present

## 2021-06-07 DIAGNOSIS — Z5111 Encounter for antineoplastic chemotherapy: Secondary | ICD-10-CM | POA: Insufficient documentation

## 2021-06-07 DIAGNOSIS — C20 Malignant neoplasm of rectum: Secondary | ICD-10-CM

## 2021-06-07 DIAGNOSIS — Z79899 Other long term (current) drug therapy: Secondary | ICD-10-CM | POA: Diagnosis not present

## 2021-06-07 LAB — CBC WITH DIFFERENTIAL (CANCER CENTER ONLY)
Abs Immature Granulocytes: 0.01 10*3/uL (ref 0.00–0.07)
Basophils Absolute: 0 10*3/uL (ref 0.0–0.1)
Basophils Relative: 1 %
Eosinophils Absolute: 0.1 10*3/uL (ref 0.0–0.5)
Eosinophils Relative: 3 %
HCT: 32.2 % — ABNORMAL LOW (ref 36.0–46.0)
Hemoglobin: 9.8 g/dL — ABNORMAL LOW (ref 12.0–15.0)
Immature Granulocytes: 0 %
Lymphocytes Relative: 34 %
Lymphs Abs: 1.2 10*3/uL (ref 0.7–4.0)
MCH: 26.5 pg (ref 26.0–34.0)
MCHC: 30.4 g/dL (ref 30.0–36.0)
MCV: 87 fL (ref 80.0–100.0)
Monocytes Absolute: 0.4 10*3/uL (ref 0.1–1.0)
Monocytes Relative: 12 %
Neutro Abs: 1.7 10*3/uL (ref 1.7–7.7)
Neutrophils Relative %: 50 %
Platelet Count: 224 10*3/uL (ref 150–400)
RBC: 3.7 MIL/uL — ABNORMAL LOW (ref 3.87–5.11)
RDW: 15.9 % — ABNORMAL HIGH (ref 11.5–15.5)
WBC Count: 3.5 10*3/uL — ABNORMAL LOW (ref 4.0–10.5)
nRBC: 0 % (ref 0.0–0.2)

## 2021-06-07 LAB — CMP (CANCER CENTER ONLY)
ALT: 9 U/L (ref 0–44)
AST: 17 U/L (ref 15–41)
Albumin: 3.5 g/dL (ref 3.5–5.0)
Alkaline Phosphatase: 61 U/L (ref 38–126)
Anion gap: 8 (ref 5–15)
BUN: 19 mg/dL (ref 8–23)
CO2: 25 mmol/L (ref 22–32)
Calcium: 8.9 mg/dL (ref 8.9–10.3)
Chloride: 107 mmol/L (ref 98–111)
Creatinine: 0.76 mg/dL (ref 0.44–1.00)
GFR, Estimated: >60 mL/min (ref >60)
Glucose, Bld: 83 mg/dL (ref 70–99)
Potassium: 4.1 mmol/L (ref 3.5–5.1)
Sodium: 140 mmol/L (ref 135–145)
Total Bilirubin: 0.4 mg/dL (ref 0.3–1.2)
Total Protein: 6.7 g/dL (ref 6.5–8.1)

## 2021-06-07 LAB — CEA (IN HOUSE-CHCC): CEA (CHCC-In House): 3.74 ng/mL (ref 0.00–5.00)

## 2021-06-07 LAB — TOTAL PROTEIN, URINE DIPSTICK: Protein, ur: NEGATIVE mg/dL

## 2021-06-07 MED ORDER — SODIUM CHLORIDE 0.9 % IV SOLN: Freq: Once | INTRAVENOUS | Status: AC

## 2021-06-07 MED ORDER — SODIUM CHLORIDE 0.9% FLUSH
10.0000 mL | INTRAVENOUS | Status: DC | PRN
  Administered 2021-06-07: 10 mL

## 2021-06-07 MED ORDER — SODIUM CHLORIDE 0.9 % IV SOLN
5.0000 mg/kg | Freq: Once | INTRAVENOUS | Status: AC
  Administered 2021-06-07: 225 mg via INTRAVENOUS
  Filled 2021-06-07: qty 9

## 2021-06-07 MED ORDER — ATROPINE SULFATE 1 MG/ML IV SOLN
0.5000 mg | Freq: Once | INTRAVENOUS | Status: AC | PRN
  Administered 2021-06-07: 0.5 mg via INTRAVENOUS
  Filled 2021-06-07: qty 1

## 2021-06-07 MED ORDER — SODIUM CHLORIDE 0.9 % IV SOLN
130.0000 mg/m2 | Freq: Once | INTRAVENOUS | Status: AC
  Administered 2021-06-07: 180 mg via INTRAVENOUS
  Filled 2021-06-07: qty 9

## 2021-06-07 MED ORDER — SODIUM CHLORIDE 0.9 % IV SOLN
10.0000 mg | Freq: Once | INTRAVENOUS | Status: AC
  Administered 2021-06-07: 10 mg via INTRAVENOUS
  Filled 2021-06-07: qty 10

## 2021-06-07 MED ORDER — PALONOSETRON HCL INJECTION 0.25 MG/5ML
0.2500 mg | Freq: Once | INTRAVENOUS | Status: AC
  Administered 2021-06-07: 0.25 mg via INTRAVENOUS
  Filled 2021-06-07: qty 5

## 2021-06-07 MED ORDER — HEPARIN SOD (PORK) LOCK FLUSH 100 UNIT/ML IV SOLN
500.0000 [IU] | Freq: Once | INTRAVENOUS | Status: AC | PRN
  Administered 2021-06-07: 500 [IU]

## 2021-06-07 MED ORDER — SODIUM CHLORIDE 0.9% FLUSH
10.0000 mL | Freq: Once | INTRAVENOUS | Status: AC
  Administered 2021-06-07: 10 mL

## 2021-06-07 NOTE — Progress Notes (Signed)
[place]   Telephone:([phone] Fax:(336) [phone]   Clinic Follow up Note   Patient Care Team: Patient, No Pcp Per (Inactive) as PCP - General (General Practice) [name], NP as Nurse Practitioner (Oncology) [name], MD as Consulting Physician (Oncology) [name], RN (Inactive) as Oncology Nurse Navigator  Date of Service:  06/07/2021  CHIEF COMPLAINT: f/u of metastatic rectal cancer, h/o colon cancer  CURRENT THERAPY:  Maintenance irinotecan and bevacizumab from 01/24/21  ASSESSMENT & PLAN:  Brittney Spencer is a 70 y.o. female with   1. Rectal adenocarcinoma, with liver and right pelvic metastasis, recurrence from previous colon cancer vs new primary   -Diagnosed on routine screening colonoscopy 05/2020, rectal mass biopsy showed invasive adenocarcinoma, arising from a tubular adenoma -Her 07/12/20 PET showed positive uptake of known rectal tumor and soft tissue mass within the right iliac fossa indicating recurrent prior colon cancer. There is also left lobe of liver lesion is FDG avid concerning for liver metastasis. pt declined liver biopsy -whether this is metastatic rectal or recurrent/metastatic colon cancer, this is likely not curable, but still treatable. She previously declined referral for HIPEC -her FO is still pending, will see if she is eligible for EGFR inhibitor -She began first-line systemic chemo with dose-reduced FOLFIRI on 08/06/20. -Bevacizumab added, and irinotecan/5FU increased with cycle 2, but due to poor tolerance/diarrhea irinotecan was reduced with cycle 3 to 130 mg per metered squared, tolerating well -CT CAP 01/22/21 showed continued good response to treatment, decreased size and liver metastasis, and indeterminate left upper lobe ground Glass lung nodule.  Tumor marker has normalized on treatment -She changed to maintenance therapy with irinotecan and bevacizumab every 2 weeks -Restaging CT CAP 04/23/2021 showed good  partial response, continue current treatment. Will plan for repeat around end of January 2023. -labs reviewed, overall stable. She will continue with/resume [name] on day 3. Her weight has been stable and over 100 since 04/25/21.   2. Cancer of ascending colon, pT4bN1bM0, stage IIIC, MSI-stable, (+) surgical margins at pelvic wall     -She was diagnosed in 12/2015. She is s/p right hemicolectomy with right salpingo oophorectomy and  adjuvant ChemoRT. -she declined adjuvant chemo due to the concern of side effects and impact on her quality of life. -Her 04/2019 CT scan was NED  -With recent rectal cancer diagnosis, her PET from 07/12/20 indicates soft tissue mass within the right iliac fossa is noted and consistent with local tumor recurrence from previous ascending colon tumor.   3. Mild Anemia -Has been stable and mild for the past year.  -worse lately due to rectal cancer and starting chemo -denies bleeding -monitoring, stable in 9-range lately.   4. HTN, Anxiety -She'll follow-up with her primary care physician and continue medication -She uses half tab Xanax as needed, mostly only on treatment days. -stable   PLAN: -proceed with irinotecan/beva today  -add [name] injection to day 3 (06/09/21) -return for next cycle as scheduled on 07/05/21 (chemo break for holiday)  -she is interested in Covid booster that day   No problem-specific Assessment & Plan notes found for this encounter.   SUMMARY OF ONCOLOGIC HISTORY: Oncology History Overview Note  Cancer Staging Cancer of ascending colon [facility]) Staging form: Colon and Rectum, AJCC 7th Edition - Clinical stage from 02/07/2016: Stage IIIC (T4b, N1b, M0) - Signed by [name], MD on 03/04/2016 Laterality: Right Residual tumor (R): R2 - Macroscopic    Cancer of ascending colon ([place])  10/25/2015 Imaging   CT ABD/PELVIS:  Inflammatory changes inferior to the cecal tip appear improved, there is still irregular soft tissue thickening of  the cecal tip, and there are adjacent prominent lymph nodes in the ileocolonic mesentery, measuring 13 mm on image 49 and 8 mm on image 52. In addition, there is a 2.5 x 1.8 cm nodule on image 46 which has central low density. Therefore, these findings are moderately suspicious for an underlying cecal malignancy with perforation.    01/19/2016 Procedure   COLONOSCOPY per Dr. [name]: Fungating, ulcerated mass almost obstructing mid ascending colon   01/19/2016 Initial Biopsy   Diagnosis Surgical [P], cecal mass - INVASIVE ADENOCARCINOMA WITH ULCERATION. - SEE COMMENT.   02/05/2016 Tumor Marker   Patient's tumor was tested for the following markers: CEA Results of the tumor marker test revealed 5.7.   02/07/2016 Initial Diagnosis   Cancer of ascending colon ([place])   02/07/2016 Definitive Surgery   Laparoscopic assisted right hemicolectomy and right salpingo oopherectomy--Dr. [name]   02/07/2016 Pathologic Stage   p T4 N1b   2/43 nodes +   02/07/2016 Pathology Results   MMR normal; G2 adenocarcinoma;proximal & distal margins negative; soft tissue mass on pelvic sidewall + for adenocarcinoma with positive margin MSI Stable   03/08/2016 Imaging   CT chest negative for metastasis.    03/19/2016 - 04/25/2016 Radiation Therapy   Adjuvant irradiation, 50 gray in 28 fractions   03/19/2016 - 04/22/2016 Chemotherapy   Xeloda 1500 mg twice daily, started on 03/19/2016, dose reduced to 1000 mg twice daily from week 3 due to neutropenia, and patient stopped 3 days before last dose radiation due to difficulty swallowing the pill    05/20/2016 -  Adjuvant Chemotherapy   Patient declined adjuvant chemotherapy   09/16/2016 Imaging   CT CAP w Contrast 1. No evidence of local tumor recurrence at the ileocolic anastomosis. 2. No findings suspicious for metastatic disease in the chest, abdomen or pelvis. 3. Nonspecific trace free fluid in the pelvic cul-de-sac. 4. Stable solitary 3 mm right upper lobe  pulmonary nodule, for which 6 month stability has been demonstrated, probably benign. 5. Additional findings include stable right posterior pericardial cyst and small calcified uterine fibroids.   05/13/2017 Imaging   CT CAP W Contrast 05/13/17 IMPRESSION: 1. No current findings of residual or recurrent malignancy. 2. Mild prominence of stool throughout the colon. Nondistended portions of the rectum. 3. Several tiny pulmonary nodules are stable from the earliest available comparison of 03/08/2016 and probably benign, but may merit surveillance. 4. Other imaging findings of potential clinical significance: Old granulomatous disease. Aortoiliac atherosclerotic vascular disease. Lumbar spondylosis and degenerative disc disease. Stable amount of trace free pelvic fluid.   04/27/2018 Imaging   04/27/2018 CT CAP IMPRESSION: Stable exam. No evidence of recurrent or metastatic carcinoma within the chest, abdomen, or pelvis   04/19/2019 Imaging   CT CAP W Contrast  IMPRESSION: Chest Impression:   1. No evidence of thoracic metastasis. 2. Stable small bilateral pulmonary nodules.   Abdomen / Pelvis Impression:   1. No evidence local colorectal carcinoma recurrence or metastasis in the abdomen pelvis. 2. Post RIGHT hemicolectomy.   01/22/2021 Imaging   CT CAP  IMPRESSION: CT CHEST IMPRESSION   1. Similar nonspecific pulmonary nodules. 2. New posterior left upper lobe reticulonodular opacity, suspicious for interval mild infection or inflammation. 3. No thoracic adenopathy.   CT ABDOMEN AND PELVIS IMPRESSION   1. Further decrease in size of high left hepatic lobe  3 mm low-density lesion. No new or progressive metastatic disease within the abdomen or pelvis. 2. Similar trace free pelvic fluid. 3. Similar nonspecific mid rectal wall thickening. 4.  Aortic Atherosclerosis (ICD10-I70.0).   04/23/2021 Imaging   CT CAP  IMPRESSION: 1. Treated metastatic lesion between  segments 2 and 3 of the liver, slightly smaller and less distinct than prior examination. No other signs of definite metastatic disease elsewhere in the abdomen or pelvis. 2. Multiple small pulmonary nodules, stable compared to the prior examination, favored to be benign. No definitive findings to suggest metastatic disease to the thorax. 3. Aortic atherosclerosis. 4. Additional incidental findings, as above.   Rectal cancer metastasized to liver ([place])  06/15/2020 Procedure   Screening Colonoscopy by Dr [name]  IMPRESSION - Decreased sphincter tone and internal hemorrhoids that prolapse with straining, but require manual replacement into the anal canal (Grade III) found on digital rectal exam. - Patent side-to-side ileo-colonic anastomosis, characterized by healthy appearing mucosa. - The examined portion of the ileum was normal. - One diminutive polyp in the proximal transverse colon, removed with a cold biopsy forceps. Resected and retrieved. - Likely malignant partially obstructing tumor in the mid rectum. Biopsied. Tattooed. - The examination was otherwise normal on direct and retroflexion views.   06/15/2020 Initial Biopsy   Diagnosis 1. Transverse Colon Polyp - HYPERPLASTIC POLYP 2. Rectum, biopsy - ADENOCARCINOMA ARISING IN A TUBULAR ADENOMA WITH HIGH-GRADE DYSPLASIA. SEE NOTE Diagnosis Note 2. Dr. [name] reviewed the case and concurs with the diagnosis. Dr. [name] was notified on 06/16/2020.   06/28/2020 Imaging   CT CAP  IMPRESSION: 1. New low-density focus in the anterior aspect of the lateral segment LEFT hepatic lobe measuring 1.2 x 1.0 cm, compatible with small metastatic lesion in the LEFT hepatic lobe. 2. Soft tissue in the RIGHT iliac fossa following RIGHT hemicolectomy invades the psoas musculature and is slowly enlarging over time, more linear on the prior study now highly concerning for recurrence/metastasis to this location. 3. Signs of enteritis,  potentially post radiation changes of the small bowel. Tethered small bowel in the RIGHT lower quadrant shows focal thickening and narrowing suspicious for small bowel involvement and developing partial obstruction though currently contrast passes beyond this point into the colon. 4. Rectal thickening in this patient with known rectal mass as described. 5. No evidence of metastatic disease in the chest. 6. Stable small pulmonary nodules. 7.  and aortic atherosclerosis.   Aortic Atherosclerosis (ICD10-I70.0) and Emphysema (ICD10-J43.9).   07/04/2020 Initial Diagnosis   Rectal cancer metastasized to liver ([place])   07/12/2020 PET scan   IMPRESSION: 1. Exam positive for FDG avid rectal tumor which corresponds to the recent colonoscopy findings. 2. FDG avid soft tissue mass within the right iliac fossa is noted and consistent with local tumor recurrence from previous ascending colon tumor. 3. Lateral segment left lobe of liver lesion is FDG avid concerning for liver metastasis. 4. No specific findings identified to suggest metastatic disease to the chest.   08/10/2020 -  Chemotherapy   First-line FOLFIRI q2weeks starting 08/10/20. dose reduced with cycle 1. Irinotecan/5FU increased and Bevacizumab added with cycle 2 on 08/23/2020    10/27/2020 Imaging   CT CAP  IMPRESSION: 1. Interval decrease in size of the hypermetabolic left hepatic lesion, consistent with metastatic disease. No new liver lesion evident. 2. Interval resolution of the hypermetabolic soft tissue lesion along the right iliac fossa with no measurable soft tissue lesion remaining at this location today. 3. Similar appearance of  soft tissue fullness in the rectum at the site of the hypermetabolic lesion seen previously. 4. Stable tiny bilateral pulmonary nodules. Continued attention on follow-up recommended. 5. Small volume free fluid in the pelvis. 6. Aortic Atherosclerosis (ICD10-I70.0).   01/22/2021 Imaging   CT  CAP  IMPRESSION: CT CHEST IMPRESSION   1. Similar nonspecific pulmonary nodules. 2. New posterior left upper lobe reticulonodular opacity, suspicious for interval mild infection or inflammation. 3. No thoracic adenopathy.   CT ABDOMEN AND PELVIS IMPRESSION   1. Further decrease in size of high left hepatic lobe 3 mm low-density lesion. No new or progressive metastatic disease within the abdomen or pelvis. 2. Similar trace free pelvic fluid. 3. Similar nonspecific mid rectal wall thickening. 4.  Aortic Atherosclerosis (ICD10-I70.0).   04/23/2021 Imaging   CT CAP  IMPRESSION: 1. Treated metastatic lesion between segments 2 and 3 of the liver, slightly smaller and less distinct than prior examination. No other signs of definite metastatic disease elsewhere in the abdomen or pelvis. 2. Multiple small pulmonary nodules, stable compared to the prior examination, favored to be benign. No definitive findings to suggest metastatic disease to the thorax. 3. Aortic atherosclerosis. 4. Additional incidental findings, as above.      INTERVAL HISTORY:  Brittney Spencer is here for a follow up of metastatic rectal cancer. She was last seen by NP Lacie on 05/09/21. She presents to the clinic alone. She reports she is feeling well overall, no new issues from her last cycle. She notes her appetite decreases after infusion but recovers well. She notes she enjoyed her time off for Thanksgiving.   All other systems were reviewed with the patient and are negative.  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 Some residual intraretinal fluid remains, very small localized subfoveal of fluid remains although this continues to slowly resorb. We'll continue to observe.   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  ALLERGIES:  is allergic to fish allergy, peanut-containing drug products, soy allergy, and buspirone.  MEDICATIONS:  Current Outpatient Medications  Medication Sig Dispense Refill   ALPRAZolam (XANAX) 0.25 MG tablet Take 1 tablet (0.25 mg total) by mouth daily as needed for anxiety. 30 tablet 0  Cholecalciferol (VITAMIN D3 PO) Take by mouth daily.     diphenoxylate-atropine (LOMOTIL) 2.5-0.025 MG tablet Take 2 tablets by mouth 4 (four) times daily as needed for diarrhea or loose stools. 45 tablet 3   docusate sodium (COLACE) 100 MG capsule 1 capsule as needed     lidocaine-prilocaine (EMLA) cream Apply 1 application topically as needed. 30 g 1   NORVASC 2.5 MG tablet TAKE 1 TABLET BY MOUTH EVERY DAY 90 tablet 1   ondansetron (ZOFRAN) 8 MG tablet Take 1 tablet (8 mg total) by mouth every 8 (eight) hours as needed for nausea or vomiting. 20 tablet 2   Current Facility-Administered Medications  Medication  Dose Route Frequency Provider Last Rate Last Admin   0.9 %  sodium chloride infusion  500 mL Intravenous Once Nelida Meuse III, MD        PHYSICAL EXAMINATION: ECOG PERFORMANCE STATUS: 1 - Symptomatic but completely ambulatory  Vitals:   06/07/21 0907  BP: (!) 147/86  Pulse: 77  Resp: 17  Temp: 98.6 F (37 C)  SpO2: 100%   Wt Readings from Last 3 Encounters:  06/07/21 47.6 kg  05/09/21 45.6 kg  04/25/21 45.8 kg     GENERAL:alert, no distress and comfortable SKIN: skin color normal, no rashes or significant lesions EYES: normal, Conjunctiva are pink and non-injected, sclera clear  NEURO: alert & oriented x 3 with fluent speech  LABORATORY DATA:  I have reviewed the data as listed CBC Latest Ref Rng & Units 06/07/2021 05/09/2021 04/25/2021  WBC 4.0 - 10.5 K/uL 3.5(L) 2.8(L) 3.1(L)  Hemoglobin 12.0 - 15.0 g/dL 9.8(L) 9.3(L) 9.3(L)  Hematocrit 36.0 - 46.0 % 32.2(L) 29.8(L) 30.0(L)  Platelets 150 - 400 K/uL 224 155 209     CMP Latest Ref Rng & Units 06/07/2021 05/09/2021 04/25/2021  Glucose 70 - 99 mg/dL 83 85 87  BUN 8 - 23 mg/dL _0 Creatinine 0.44 - 1.00 mg/dL 0.76 0.72 0.73  Sodium 135 - 145 mmol/L 140 138 139  Potassium 3.5 - 5.1 mmol/L 4.1 4.1 4.3  Chloride 98 - 111 mmol/L 107 107 105  CO2 22 - 32 mmol/L _1 Calcium 8.9 - 10.3 mg/dL 8.9 8.6(L) 9.2  Total Protein 6.5 - 8.1 g/dL 6.7 6.7 6.7  Total Bilirubin 0.3 - 1.2 mg/dL 0.4 0.3 0.4  Alkaline Phos 38 - 126 U/L 61 58 60  AST 15 - 41 U/L _2 ALT 0 - 44 U/L _3 RADIOGRAPHIC STUDIES: I have personally reviewed the radiological images as listed and agreed with the findings in the report. No results found.    No orders of the defined types were placed in this encounter.  All questions were answered. The patient knows to call the clinic with any problems, questions or concerns. No barriers to learning was detected. The total time spent in the appointment was 30 minutes.     [name],  MD 06/07/2021   I, Wilburn Mylar, am acting as scribe for [name], MD.   I have reviewed the above documentation for accuracy and completeness, and I agree with the above.

## 2021-06-07 NOTE — Patient Instructions (Signed)
[facility]  Discharge Instructions: Thank you for choosing [facility] to provide your oncology and hematology care.   If you have a lab appointment with the [facility], please go directly to the [facility] and check in at the registration area.   Wear comfortable clothing and clothing appropriate for easy access to any Portacath or PICC line.   We strive to give you quality time with your provider. You may need to reschedule your appointment if you arrive late (15 or more minutes).  Arriving late affects you and other patients whose appointments are after yours.  Also, if you miss three or more appointments without notifying the office, you may be dismissed from the clinic at the provider's discretion.      For prescription refill requests, have your pharmacy contact our office and allow 72 hours for refills to be completed.    Today you received the following chemotherapy and/or immunotherapy agents: bevacizumab and irinotecan       To help prevent nausea and vomiting after your treatment, we encourage you to take your nausea medication as directed.  BELOW ARE SYMPTOMS THAT SHOULD BE REPORTED IMMEDIATELY: *FEVER GREATER THAN 100.4 F (38 C) OR HIGHER *CHILLS OR SWEATING *NAUSEA AND VOMITING THAT IS NOT CONTROLLED WITH YOUR NAUSEA MEDICATION *UNUSUAL SHORTNESS OF BREATH *UNUSUAL BRUISING OR BLEEDING *URINARY PROBLEMS (pain or burning when urinating, or frequent urination) *BOWEL PROBLEMS (unusual diarrhea, constipation, pain near the anus) TENDERNESS IN MOUTH AND THROAT WITH OR WITHOUT PRESENCE OF ULCERS (sore throat, sores in mouth, or a toothache) UNUSUAL RASH, SWELLING OR PAIN  UNUSUAL VAGINAL DISCHARGE OR ITCHING   Items with * indicate a potential emergency and should be followed up as soon as possible or go to the Emergency Department if any problems should occur.  Please show the CHEMOTHERAPY ALERT CARD or IMMUNOTHERAPY ALERT  CARD at check-in to the Emergency Department and triage nurse.  Should you have questions after your visit or need to cancel or reschedule your appointment, please contact [facility]  Dept: [phone]  and follow the prompts.  Office hours are 8:00 a.m. to 4:30 p.m. Monday - Friday. Please note that voicemails left after 4:00 p.m. may not be returned until the following business day.  We are closed weekends and major holidays. You have access to a nurse at all times for urgent questions. Please call the main number to the clinic Dept: [phone] and follow the prompts.   For any non-urgent questions, you may also contact your provider using MyChart. We now offer e-Visits for anyone 57 and older to request care online for non-urgent symptoms. For details visit [email or web address].   Also download the MyChart app! Go to the app store, search "MyChart", open the app, select [facility], and log in with your MyChart username and password.  Due to Covid, a mask is required upon entering the hospital/clinic. If you do not have a mask, one will be given to you upon arrival. For doctor visits, patients may have 1 support person aged 79 or older with them. For treatment visits, patients cannot have anyone with them due to current Covid guidelines and our immunocompromised population.

## 2021-06-08 ENCOUNTER — Telehealth: Payer: Self-pay | Admitting: Hematology

## 2021-06-08 NOTE — Telephone Encounter (Signed)
Left message with follow-up appointments per 12/8 los.

## 2021-06-09 ENCOUNTER — Other Ambulatory Visit: Payer: Self-pay

## 2021-06-09 ENCOUNTER — Encounter: Payer: Self-pay | Admitting: Hematology

## 2021-06-09 ENCOUNTER — Inpatient Hospital Stay: Payer: Federal, State, Local not specified - PPO

## 2021-06-09 VITALS — BP 130/67 | HR 82 | Temp 98.7°F | Resp 18

## 2021-06-09 DIAGNOSIS — Z5112 Encounter for antineoplastic immunotherapy: Secondary | ICD-10-CM | POA: Diagnosis not present

## 2021-06-09 DIAGNOSIS — C20 Malignant neoplasm of rectum: Secondary | ICD-10-CM

## 2021-06-09 DIAGNOSIS — C787 Secondary malignant neoplasm of liver and intrahepatic bile duct: Secondary | ICD-10-CM

## 2021-06-09 MED ORDER — PEGFILGRASTIM-CBQV 6 MG/0.6ML ~~LOC~~ SOSY
6.0000 mg | PREFILLED_SYRINGE | Freq: Once | SUBCUTANEOUS | Status: AC
  Administered 2021-06-09: 6 mg via SUBCUTANEOUS
  Filled 2021-06-09: qty 0.6

## 2021-07-04 MED FILL — Dexamethasone Sodium Phosphate Inj 100 MG/10ML: INTRAMUSCULAR | Qty: 1 | Status: AC

## 2021-07-05 ENCOUNTER — Encounter: Payer: Self-pay | Admitting: Hematology

## 2021-07-05 ENCOUNTER — Inpatient Hospital Stay: Payer: Federal, State, Local not specified - PPO

## 2021-07-05 ENCOUNTER — Inpatient Hospital Stay: Payer: Federal, State, Local not specified - PPO | Admitting: Hematology

## 2021-07-05 ENCOUNTER — Other Ambulatory Visit: Payer: Self-pay

## 2021-07-05 ENCOUNTER — Inpatient Hospital Stay: Payer: Federal, State, Local not specified - PPO | Attending: Nurse Practitioner

## 2021-07-05 VITALS — BP 137/81 | HR 82 | Temp 98.3°F | Resp 18 | Ht 66.0 in | Wt 109.6 lb

## 2021-07-05 VITALS — BP 114/66

## 2021-07-05 DIAGNOSIS — C20 Malignant neoplasm of rectum: Secondary | ICD-10-CM

## 2021-07-05 DIAGNOSIS — I10 Essential (primary) hypertension: Secondary | ICD-10-CM | POA: Diagnosis not present

## 2021-07-05 DIAGNOSIS — Z9221 Personal history of antineoplastic chemotherapy: Secondary | ICD-10-CM | POA: Diagnosis not present

## 2021-07-05 DIAGNOSIS — F419 Anxiety disorder, unspecified: Secondary | ICD-10-CM | POA: Insufficient documentation

## 2021-07-05 DIAGNOSIS — C787 Secondary malignant neoplasm of liver and intrahepatic bile duct: Secondary | ICD-10-CM | POA: Insufficient documentation

## 2021-07-05 DIAGNOSIS — Z5189 Encounter for other specified aftercare: Secondary | ICD-10-CM | POA: Insufficient documentation

## 2021-07-05 DIAGNOSIS — D649 Anemia, unspecified: Secondary | ICD-10-CM | POA: Insufficient documentation

## 2021-07-05 DIAGNOSIS — C182 Malignant neoplasm of ascending colon: Secondary | ICD-10-CM | POA: Diagnosis present

## 2021-07-05 DIAGNOSIS — Z5111 Encounter for antineoplastic chemotherapy: Secondary | ICD-10-CM | POA: Insufficient documentation

## 2021-07-05 DIAGNOSIS — Z1509 Genetic susceptibility to other malignant neoplasm: Secondary | ICD-10-CM | POA: Insufficient documentation

## 2021-07-05 DIAGNOSIS — Z923 Personal history of irradiation: Secondary | ICD-10-CM | POA: Diagnosis not present

## 2021-07-05 DIAGNOSIS — Z5112 Encounter for antineoplastic immunotherapy: Secondary | ICD-10-CM | POA: Diagnosis present

## 2021-07-05 DIAGNOSIS — Z79899 Other long term (current) drug therapy: Secondary | ICD-10-CM | POA: Insufficient documentation

## 2021-07-05 DIAGNOSIS — Z95828 Presence of other vascular implants and grafts: Secondary | ICD-10-CM

## 2021-07-05 LAB — CBC WITH DIFFERENTIAL (CANCER CENTER ONLY)
Abs Immature Granulocytes: 0 10*3/uL (ref 0.00–0.07)
Basophils Absolute: 0 10*3/uL (ref 0.0–0.1)
Basophils Relative: 1 %
Eosinophils Absolute: 0.1 10*3/uL (ref 0.0–0.5)
Eosinophils Relative: 4 %
HCT: 32.1 % — ABNORMAL LOW (ref 36.0–46.0)
Hemoglobin: 9.8 g/dL — ABNORMAL LOW (ref 12.0–15.0)
Immature Granulocytes: 0 %
Lymphocytes Relative: 32 %
Lymphs Abs: 1.1 10*3/uL (ref 0.7–4.0)
MCH: 26.6 pg (ref 26.0–34.0)
MCHC: 30.5 g/dL (ref 30.0–36.0)
MCV: 87 fL (ref 80.0–100.0)
Monocytes Absolute: 0.4 10*3/uL (ref 0.1–1.0)
Monocytes Relative: 11 %
Neutro Abs: 1.7 10*3/uL (ref 1.7–7.7)
Neutrophils Relative %: 52 %
Platelet Count: 219 10*3/uL (ref 150–400)
RBC: 3.69 MIL/uL — ABNORMAL LOW (ref 3.87–5.11)
RDW: 15.2 % (ref 11.5–15.5)
WBC Count: 3.3 10*3/uL — ABNORMAL LOW (ref 4.0–10.5)
nRBC: 0 % (ref 0.0–0.2)

## 2021-07-05 LAB — CMP (CANCER CENTER ONLY)
ALT: 8 U/L (ref 0–44)
AST: 18 U/L (ref 15–41)
Albumin: 3.7 g/dL (ref 3.5–5.0)
Alkaline Phosphatase: 56 U/L (ref 38–126)
Anion gap: 4 — ABNORMAL LOW (ref 5–15)
BUN: 19 mg/dL (ref 8–23)
CO2: 29 mmol/L (ref 22–32)
Calcium: 8.7 mg/dL — ABNORMAL LOW (ref 8.9–10.3)
Chloride: 105 mmol/L (ref 98–111)
Creatinine: 0.78 mg/dL (ref 0.44–1.00)
GFR, Estimated: >60 mL/min (ref >60)
Glucose, Bld: 100 mg/dL — ABNORMAL HIGH (ref 70–99)
Potassium: 4.1 mmol/L (ref 3.5–5.1)
Sodium: 138 mmol/L (ref 135–145)
Total Bilirubin: 0.5 mg/dL (ref 0.3–1.2)
Total Protein: 6.9 g/dL (ref 6.5–8.1)

## 2021-07-05 LAB — CEA (IN HOUSE-CHCC): CEA (CHCC-In House): 4.05 ng/mL (ref 0.00–5.00)

## 2021-07-05 LAB — TOTAL PROTEIN, URINE DIPSTICK: Protein, ur: NEGATIVE mg/dL

## 2021-07-05 MED ORDER — SODIUM CHLORIDE 0.9 % IV SOLN
130.0000 mg/m2 | Freq: Once | INTRAVENOUS | Status: AC
  Administered 2021-07-05: 180 mg via INTRAVENOUS
  Filled 2021-07-05: qty 9

## 2021-07-05 MED ORDER — PALONOSETRON HCL INJECTION 0.25 MG/5ML
0.2500 mg | Freq: Once | INTRAVENOUS | Status: AC
  Administered 2021-07-05: 0.25 mg via INTRAVENOUS
  Filled 2021-07-05: qty 5

## 2021-07-05 MED ORDER — ATROPINE SULFATE 1 MG/ML IV SOLN
0.5000 mg | Freq: Once | INTRAVENOUS | Status: AC | PRN
  Administered 2021-07-05: 0.5 mg via INTRAVENOUS
  Filled 2021-07-05: qty 1

## 2021-07-05 MED ORDER — SODIUM CHLORIDE 0.9 % IV SOLN
5.0000 mg/kg | Freq: Once | INTRAVENOUS | Status: AC
  Administered 2021-07-05: 225 mg via INTRAVENOUS
  Filled 2021-07-05: qty 9

## 2021-07-05 MED ORDER — HEPARIN SOD (PORK) LOCK FLUSH 100 UNIT/ML IV SOLN: 500.0000 [IU] | Freq: Once | INTRAVENOUS | Status: DC | PRN

## 2021-07-05 MED ORDER — SODIUM CHLORIDE 0.9 % IV SOLN: Freq: Once | INTRAVENOUS | Status: AC

## 2021-07-05 MED ORDER — SODIUM CHLORIDE 0.9 % IV SOLN
10.0000 mg | Freq: Once | INTRAVENOUS | Status: AC
  Administered 2021-07-05: 10 mg via INTRAVENOUS
  Filled 2021-07-05: qty 10

## 2021-07-05 MED ORDER — SODIUM CHLORIDE 0.9% FLUSH
10.0000 mL | Freq: Once | INTRAVENOUS | Status: AC
  Administered 2021-07-05: 10 mL

## 2021-07-05 MED ORDER — SODIUM CHLORIDE 0.9% FLUSH: 10.0000 mL | INTRAVENOUS | Status: DC | PRN

## 2021-07-05 NOTE — Patient Instructions (Signed)
[facility]  Discharge Instructions: Thank you for choosing [facility] to provide your oncology and hematology care.   If you have a lab appointment with the [facility], please go directly to the [facility] and check in at the registration area.   Wear comfortable clothing and clothing appropriate for easy access to any Portacath or PICC line.   We strive to give you quality time with your provider. You may need to reschedule your appointment if you arrive late (15 or more minutes).  Arriving late affects you and other patients whose appointments are after yours.  Also, if you miss three or more appointments without notifying the office, you may be dismissed from the clinic at the providers discretion.      For prescription refill requests, have your pharmacy contact our office and allow 72 hours for refills to be completed.    Today you received the following chemotherapy and/or immunotherapy agents: bevacizumab and irinotecan       To help prevent nausea and vomiting after your treatment, we encourage you to take your nausea medication as directed.  BELOW ARE SYMPTOMS THAT SHOULD BE REPORTED IMMEDIATELY: *FEVER GREATER THAN 100.4 F (38 C) OR HIGHER *CHILLS OR SWEATING *NAUSEA AND VOMITING THAT IS NOT CONTROLLED WITH YOUR NAUSEA MEDICATION *UNUSUAL SHORTNESS OF BREATH *UNUSUAL BRUISING OR BLEEDING *URINARY PROBLEMS (pain or burning when urinating, or frequent urination) *BOWEL PROBLEMS (unusual diarrhea, constipation, pain near the anus) TENDERNESS IN MOUTH AND THROAT WITH OR WITHOUT PRESENCE OF ULCERS (sore throat, sores in mouth, or a toothache) UNUSUAL RASH, SWELLING OR PAIN  UNUSUAL VAGINAL DISCHARGE OR ITCHING   Items with * indicate a potential emergency and should be followed up as soon as possible or go to the Emergency Department if any problems should occur.  Please show the CHEMOTHERAPY ALERT CARD or IMMUNOTHERAPY ALERT  CARD at check-in to the Emergency Department and triage nurse.  Should you have questions after your visit or need to cancel or reschedule your appointment, please contact [facility]  Dept: [phone]  and follow the prompts.  Office hours are 8:00 a.m. to 4:30 p.m. Monday - Friday. Please note that voicemails left after 4:00 p.m. may not be returned until the following business day.  We are closed weekends and major holidays. You have access to a nurse at all times for urgent questions. Please call the main number to the clinic Dept: [phone] and follow the prompts.   For any non-urgent questions, you may also contact your provider using MyChart. We now offer e-Visits for anyone 73 and older to request care online for non-urgent symptoms. For details visit [email or web address].   Also download the MyChart app! Go to the app store, search "MyChart", open the app, select [facility], and log in with your MyChart username and password.  Due to Covid, a mask is required upon entering the hospital/clinic. If you do not have a mask, one will be given to you upon arrival. For doctor visits, patients may have 1 support person aged 30 or older with them. For treatment visits, patients cannot have anyone with them due to current Covid guidelines and our immunocompromised population.

## 2021-07-05 NOTE — Progress Notes (Signed)
Brittney Spencer   Telephone:([phone] Fax:(336) [phone]   Clinic Follow up Note   Patient Care Team: Patient, No Pcp Per (Inactive) as PCP - General (General Practice) [name], NP as Nurse Practitioner (Oncology) [name], MD as Consulting Physician (Oncology) [name], RN (Inactive) as Oncology Nurse Navigator  Date of Service:  07/05/2021  CHIEF COMPLAINT: f/u of metastatic rectal cancer, h/o colon cancer  CURRENT THERAPY:  Maintenance irinotecan and bevacizumab, q2weeks, from 01/24/21  ASSESSMENT & PLAN:  Brittney Spencer is a 71 y.o. female with   1. Rectal adenocarcinoma, with liver and right pelvic metastasis, recurrence from previous colon cancer vs new primary   -Diagnosed on routine screening colonoscopy 05/2020, rectal mass biopsy showed invasive adenocarcinoma, arising from a tubular adenoma -Her 07/12/20 PET showed positive uptake of known rectal tumor and soft tissue mass within the right iliac fossa indicating recurrent prior colon cancer. There is also left lobe of liver lesion is FDG avid concerning for liver metastasis. pt declined liver biopsy -whether this is metastatic rectal or recurrent/metastatic colon cancer, this is likely not curable, but still treatable. She previously declined referral for HIPEC -She began first-line systemic chemo with dose-reduced FOLFIRI on 08/06/20. -Bevacizumab added, and irinotecan/5FU increased with cycle 2, but due to poor tolerance/diarrhea irinotecan was reduced with cycle 3 to 130 mg per metered squared, tolerated better -She changed to maintenance therapy with irinotecan and bevacizumab every 2 weeks on 01/24/21 -Restaging CT CAP 04/23/21 showed good partial response, continue current treatment. Will plan for repeat around end of January 2023. -labs reviewed, overall stable. Her weight has been stable and over 100 since 04/25/21. She is ready to restart treatment today.   2. Cancer of ascending  colon, pT4bN1bM0, stage IIIC, MSI-stable, (+) surgical margins at pelvic wall     -She was diagnosed in 12/2015. She is s/p right hemicolectomy with right salpingo oophorectomy and  adjuvant ChemoRT. -she declined adjuvant chemo due to the concern of side effects and impact on her quality of life. -Her 04/2019 CT scan was NED  -With recent rectal cancer diagnosis, her PET from 07/12/20 indicates soft tissue mass within the right iliac fossa is noted and consistent with local tumor recurrence from previous ascending colon tumor.   3. Mild Anemia -Has been stable and mild for the past year.  -worse lately due to rectal cancer and starting chemo -denies bleeding -monitoring, stable in 9-range.   4. HTN, Anxiety -She'll follow-up with her primary care physician and continue medication -She uses half tab Xanax as needed, mostly only on treatment days. -stable     PLAN: -proceed with irinotecan/beva today             -[name] injection on day 3   -Covid booster today -return for next cycle as scheduled on 1/19 and 2/2   No problem-specific Assessment & Plan notes found for this encounter.   SUMMARY OF ONCOLOGIC HISTORY: Oncology History Overview Note  Cancer Staging Cancer of ascending colon [facility]) Staging form: Colon and Rectum, AJCC 7th Edition - Clinical stage from 02/07/2016: Stage IIIC (T4b, N1b, M0) - Signed by [name], MD on 03/04/2016 Laterality: Right Residual tumor (R): R2 - Macroscopic    Cancer of ascending colon ([place])  10/25/2015 Imaging   CT ABD/PELVIS:  Inflammatory changes inferior to the cecal tip appear improved, there is still irregular soft tissue thickening of the cecal tip, and there are adjacent prominent lymph nodes in the ileocolonic mesentery,  measuring 13 mm on image 49 and 8 mm on image 52. In addition, there is a 2.5 x 1.8 cm nodule on image 46 which has central low density. Therefore, these findings are moderately suspicious for an underlying cecal  malignancy with perforation.    01/19/2016 Procedure   COLONOSCOPY per Dr. [name]: Fungating, ulcerated mass almost obstructing mid ascending colon   01/19/2016 Initial Biopsy   Diagnosis Surgical [P], cecal mass - INVASIVE ADENOCARCINOMA WITH ULCERATION. - SEE COMMENT.   02/05/2016 Tumor Marker   Patient's tumor was tested for the following markers: CEA Results of the tumor marker test revealed 5.7.   02/07/2016 Initial Diagnosis   Cancer of ascending colon ([place])   02/07/2016 Definitive Surgery   Laparoscopic assisted right hemicolectomy and right salpingo oopherectomy--Dr. [name]   02/07/2016 Pathologic Stage   p T4 N1b   2/43 nodes +   02/07/2016 Pathology Results   MMR normal; G2 adenocarcinoma;proximal & distal margins negative; soft tissue mass on pelvic sidewall + for adenocarcinoma with positive margin MSI Stable   03/08/2016 Imaging   CT chest negative for metastasis.    03/19/2016 - 04/25/2016 Radiation Therapy   Adjuvant irradiation, 50 gray in 28 fractions   03/19/2016 - 04/22/2016 Chemotherapy   Xeloda 1500 mg twice daily, started on 03/19/2016, dose reduced to 1000 mg twice daily from week 3 due to neutropenia, and patient stopped 3 days before last dose radiation due to difficulty swallowing the pill    05/20/2016 -  Adjuvant Chemotherapy   Patient declined adjuvant chemotherapy   09/16/2016 Imaging   CT CAP w Contrast 1. No evidence of local tumor recurrence at the ileocolic anastomosis. 2. No findings suspicious for metastatic disease in the chest, abdomen or pelvis. 3. Nonspecific trace free fluid in the pelvic cul-de-sac. 4. Stable solitary 3 mm right upper lobe pulmonary nodule, for which 6 month stability has been demonstrated, probably benign. 5. Additional findings include stable right posterior pericardial cyst and small calcified uterine fibroids.   05/13/2017 Imaging   CT CAP W Contrast 05/13/17 IMPRESSION: 1. No current findings of residual or  recurrent malignancy. 2. Mild prominence of stool throughout the colon. Nondistended portions of the rectum. 3. Several tiny pulmonary nodules are stable from the earliest available comparison of 03/08/2016 and probably benign, but may merit surveillance. 4. Other imaging findings of potential clinical significance: Old granulomatous disease. Aortoiliac atherosclerotic vascular disease. Lumbar spondylosis and degenerative disc disease. Stable amount of trace free pelvic fluid.   04/27/2018 Imaging   04/27/2018 CT CAP IMPRESSION: Stable exam. No evidence of recurrent or metastatic carcinoma within the chest, abdomen, or pelvis   04/19/2019 Imaging   CT CAP W Contrast  IMPRESSION: Chest Impression:   1. No evidence of thoracic metastasis. 2. Stable small bilateral pulmonary nodules.   Abdomen / Pelvis Impression:   1. No evidence local colorectal carcinoma recurrence or metastasis in the abdomen pelvis. 2. Post RIGHT hemicolectomy.   01/22/2021 Imaging   CT CAP  IMPRESSION: CT CHEST IMPRESSION   1. Similar nonspecific pulmonary nodules. 2. New posterior left upper lobe reticulonodular opacity, suspicious for interval mild infection or inflammation. 3. No thoracic adenopathy.   CT ABDOMEN AND PELVIS IMPRESSION   1. Further decrease in size of high left hepatic lobe 3 mm low-density lesion. No new or progressive metastatic disease within the abdomen or pelvis. 2. Similar trace free pelvic fluid. 3. Similar nonspecific mid rectal wall thickening. 4.  Aortic Atherosclerosis (ICD10-I70.0).   04/23/2021 Imaging  CT CAP  IMPRESSION: 1. Treated metastatic lesion between segments 2 and 3 of the liver, slightly smaller and less distinct than prior examination. No other signs of definite metastatic disease elsewhere in the abdomen or pelvis. 2. Multiple small pulmonary nodules, stable compared to the prior examination, favored to be benign. No definitive findings to  suggest metastatic disease to the thorax. 3. Aortic atherosclerosis. 4. Additional incidental findings, as above.   Rectal cancer metastasized to liver ([place])  06/15/2020 Procedure   Screening Colonoscopy by Dr [name]  IMPRESSION - Decreased sphincter tone and internal hemorrhoids that prolapse with straining, but require manual replacement into the anal canal (Grade III) found on digital rectal exam. - Patent side-to-side ileo-colonic anastomosis, characterized by healthy appearing mucosa. - The examined portion of the ileum was normal. - One diminutive polyp in the proximal transverse colon, removed with a cold biopsy forceps. Resected and retrieved. - Likely malignant partially obstructing tumor in the mid rectum. Biopsied. Tattooed. - The examination was otherwise normal on direct and retroflexion views.   06/15/2020 Initial Biopsy   Diagnosis 1. Transverse Colon Polyp - HYPERPLASTIC POLYP 2. Rectum, biopsy - ADENOCARCINOMA ARISING IN A TUBULAR ADENOMA WITH HIGH-GRADE DYSPLASIA. SEE NOTE Diagnosis Note 2. Dr. [name] reviewed the case and concurs with the diagnosis. Dr. [name] was notified on 06/16/2020.   06/28/2020 Imaging   CT CAP  IMPRESSION: 1. New low-density focus in the anterior aspect of the lateral segment LEFT hepatic lobe measuring 1.2 x 1.0 cm, compatible with small metastatic lesion in the LEFT hepatic lobe. 2. Soft tissue in the RIGHT iliac fossa following RIGHT hemicolectomy invades the psoas musculature and is slowly enlarging over time, more linear on the prior study now highly concerning for recurrence/metastasis to this location. 3. Signs of enteritis, potentially post radiation changes of the small bowel. Tethered small bowel in the RIGHT lower quadrant shows focal thickening and narrowing suspicious for small bowel involvement and developing partial obstruction though currently contrast passes beyond this point into the colon. 4. Rectal thickening in  this patient with known rectal mass as described. 5. No evidence of metastatic disease in the chest. 6. Stable small pulmonary nodules. 7.  and aortic atherosclerosis.   Aortic Atherosclerosis (ICD10-I70.0) and Emphysema (ICD10-J43.9).   07/04/2020 Initial Diagnosis   Rectal cancer metastasized to liver ([place])   07/12/2020 PET scan   IMPRESSION: 1. Exam positive for FDG avid rectal tumor which corresponds to the recent colonoscopy findings. 2. FDG avid soft tissue mass within the right iliac fossa is noted and consistent with local tumor recurrence from previous ascending colon tumor. 3. Lateral segment left lobe of liver lesion is FDG avid concerning for liver metastasis. 4. No specific findings identified to suggest metastatic disease to the chest.   08/10/2020 -  Chemotherapy   First-line FOLFIRI q2weeks starting 08/10/20. dose reduced with cycle 1. Irinotecan/5FU increased and Bevacizumab added with cycle 2 on 08/23/2020    10/27/2020 Imaging   CT CAP  IMPRESSION: 1. Interval decrease in size of the hypermetabolic left hepatic lesion, consistent with metastatic disease. No new liver lesion evident. 2. Interval resolution of the hypermetabolic soft tissue lesion along the right iliac fossa with no measurable soft tissue lesion remaining at this location today. 3. Similar appearance of soft tissue fullness in the rectum at the site of the hypermetabolic lesion seen previously. 4. Stable tiny bilateral pulmonary nodules. Continued attention on follow-up recommended. 5. Small volume free fluid in the pelvis. 6. Aortic Atherosclerosis (ICD10-I70.0).  01/22/2021 Imaging   CT CAP  IMPRESSION: CT CHEST IMPRESSION   1. Similar nonspecific pulmonary nodules. 2. New posterior left upper lobe reticulonodular opacity, suspicious for interval mild infection or inflammation. 3. No thoracic adenopathy.   CT ABDOMEN AND PELVIS IMPRESSION   1. Further decrease in size of high left  hepatic lobe 3 mm low-density lesion. No new or progressive metastatic disease within the abdomen or pelvis. 2. Similar trace free pelvic fluid. 3. Similar nonspecific mid rectal wall thickening. 4.  Aortic Atherosclerosis (ICD10-I70.0).   04/23/2021 Imaging   CT CAP  IMPRESSION: 1. Treated metastatic lesion between segments 2 and 3 of the liver, slightly smaller and less distinct than prior examination. No other signs of definite metastatic disease elsewhere in the abdomen or pelvis. 2. Multiple small pulmonary nodules, stable compared to the prior examination, favored to be benign. No definitive findings to suggest metastatic disease to the thorax. 3. Aortic atherosclerosis. 4. Additional incidental findings, as above.      INTERVAL HISTORY:  ROSAISELA JAMROZ is here for a follow up of metastatic rectal cancer. She was last seen by me on 06/07/21. She presents to the clinic alone. She reports she had a good holiday and is ready to restart treatment.   All other systems were reviewed with the patient and are negative.  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 Some residual intraretinal fluid remains, very small localized subfoveal of fluid remains although this continues to slowly resorb. We'll continue to observe.   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  ALLERGIES:  is allergic to fish allergy, peanut-containing drug products, soy allergy, and buspirone.  MEDICATIONS:  Current Outpatient Medications  Medication Sig Dispense Refill   ALPRAZolam (XANAX) 0.25 MG tablet Take 1 tablet (0.25 mg total) by mouth daily as needed for anxiety. 30 tablet 0   Cholecalciferol (VITAMIN D3 PO) Take by mouth daily.     diphenoxylate-atropine (LOMOTIL) 2.5-0.025 MG tablet Take 2 tablets by mouth 4 (four) times daily as needed for diarrhea or loose stools. 45 tablet 3   docusate sodium (COLACE) 100 MG capsule 1 capsule as needed     lidocaine-prilocaine (EMLA) cream Apply 1 application topically as needed.  30 g 1   NORVASC 2.5 MG tablet TAKE 1 TABLET BY MOUTH EVERY DAY 90 tablet 1   ondansetron (ZOFRAN) 8 MG tablet Take 1 tablet (8 mg total) by mouth every 8 (eight) hours as needed for nausea or vomiting. 20 tablet 2   Current Facility-Administered Medications  Medication Dose Route Frequency Provider Last Rate Last Admin   0.9 %  sodium chloride infusion  500 mL Intravenous Once Danis, Estill Cotta III, MD        PHYSICAL EXAMINATION: ECOG PERFORMANCE STATUS: 1 - Symptomatic but completely ambulatory  Vitals:   07/05/21 0846  BP: 137/81  Pulse: 82  Resp: 18  Temp: 98.3 F (36.8 C)  SpO2: 100%   Wt Readings from Last 3 Encounters:  07/05/21 109 lb 9.6 oz (49.7 kg)  06/07/21 105 lb (47.6 kg)   05/09/21 100 lb 8 oz (45.6 kg)     GENERAL:alert, no distress and comfortable SKIN: skin color normal, no rashes or significant lesions EYES: normal, Conjunctiva are pink and non-injected, sclera clear  NEURO: alert & oriented x 3 with fluent speech  LABORATORY DATA:  I have reviewed the data as listed CBC Latest Ref Rng & Units 07/05/2021 06/07/2021 05/09/2021  WBC 4.0 - 10.5 K/uL 3.3(L) 3.5(L) 2.8(L)  Hemoglobin 12.0 - 15.0 g/dL 9.8(L) 9.8(L) 9.3(L)  Hematocrit 36.0 - 46.0 % 32.1(L) 32.2(L) 29.8(L)  Platelets 150 - 400 K/uL 219 224 155     CMP Latest Ref Rng & Units 07/05/2021 06/07/2021 05/09/2021  Glucose 70 - 99 mg/dL 100(H) 83 85  BUN 8 - 23 mg/dL '19 19 16  ' Creatinine 0.44 - 1.00 mg/dL 0.78 0.76 0.72  Sodium 135 - 145 mmol/L 138 140 138  Potassium 3.5 - 5.1 mmol/L 4.1 4.1 4.1  Chloride 98 - 111 mmol/L 105 107 107  CO2 22 - 32 mmol/L '29 25 25  ' Calcium 8.9 - 10.3 mg/dL 8.7(L) 8.9 8.6(L)  Total Protein 6.5 - 8.1 g/dL 6.9 6.7 6.7  Total Bilirubin 0.3 - 1.2 mg/dL 0.5 0.4 0.3  Alkaline Phos 38 - 126 U/L 56 61 58  AST 15 - 41 U/L '18 17 18  ' ALT 0 - 44 U/L '8 9 7      ' RADIOGRAPHIC STUDIES: I have personally reviewed the radiological images as listed and agreed with the findings in the report. No results found.    No orders of the defined types were placed in this encounter.  All questions were answered. The patient knows to call the clinic with any problems, questions or concerns. No barriers to learning was detected. The total time spent in the appointment was 30 minutes.     [name], MD 07/05/2021   I, Wilburn Mylar, am acting as scribe for [name], MD.   I have reviewed the above documentation for accuracy and completeness, and I agree with the above.

## 2021-07-07 ENCOUNTER — Inpatient Hospital Stay: Payer: Federal, State, Local not specified - PPO

## 2021-07-07 ENCOUNTER — Other Ambulatory Visit: Payer: Self-pay

## 2021-07-07 VITALS — BP 144/78 | HR 79 | Temp 97.9°F | Resp 16

## 2021-07-07 DIAGNOSIS — C787 Secondary malignant neoplasm of liver and intrahepatic bile duct: Secondary | ICD-10-CM

## 2021-07-07 DIAGNOSIS — Z5112 Encounter for antineoplastic immunotherapy: Secondary | ICD-10-CM | POA: Diagnosis not present

## 2021-07-07 DIAGNOSIS — C20 Malignant neoplasm of rectum: Secondary | ICD-10-CM

## 2021-07-07 MED ORDER — PEGFILGRASTIM-CBQV 6 MG/0.6ML ~~LOC~~ SOSY
6.0000 mg | PREFILLED_SYRINGE | Freq: Once | SUBCUTANEOUS | Status: AC
  Administered 2021-07-07: 6 mg via SUBCUTANEOUS
  Filled 2021-07-07: qty 0.6

## 2021-07-09 ENCOUNTER — Encounter: Payer: Self-pay | Admitting: Gastroenterology

## 2021-07-18 MED FILL — Dexamethasone Sodium Phosphate Inj 100 MG/10ML: INTRAMUSCULAR | Qty: 1 | Status: AC

## 2021-07-19 ENCOUNTER — Encounter: Payer: Self-pay | Admitting: Hematology

## 2021-07-19 ENCOUNTER — Inpatient Hospital Stay: Payer: Federal, State, Local not specified - PPO

## 2021-07-19 ENCOUNTER — Other Ambulatory Visit: Payer: Self-pay

## 2021-07-19 ENCOUNTER — Inpatient Hospital Stay: Payer: Federal, State, Local not specified - PPO | Admitting: Hematology

## 2021-07-19 VITALS — BP 114/69

## 2021-07-19 VITALS — BP 131/78 | HR 82 | Temp 98.3°F | Resp 18 | Ht 66.0 in | Wt 111.2 lb

## 2021-07-19 DIAGNOSIS — C20 Malignant neoplasm of rectum: Secondary | ICD-10-CM

## 2021-07-19 DIAGNOSIS — C787 Secondary malignant neoplasm of liver and intrahepatic bile duct: Secondary | ICD-10-CM

## 2021-07-19 DIAGNOSIS — Z5112 Encounter for antineoplastic immunotherapy: Secondary | ICD-10-CM | POA: Diagnosis not present

## 2021-07-19 DIAGNOSIS — Z95828 Presence of other vascular implants and grafts: Secondary | ICD-10-CM

## 2021-07-19 DIAGNOSIS — C182 Malignant neoplasm of ascending colon: Secondary | ICD-10-CM

## 2021-07-19 LAB — CMP (CANCER CENTER ONLY)
ALT: 8 U/L (ref 0–44)
AST: 14 U/L — ABNORMAL LOW (ref 15–41)
Albumin: 3.7 g/dL (ref 3.5–5.0)
Alkaline Phosphatase: 72 U/L (ref 38–126)
Anion gap: 4 — ABNORMAL LOW (ref 5–15)
BUN: 17 mg/dL (ref 8–23)
CO2: 28 mmol/L (ref 22–32)
Calcium: 9 mg/dL (ref 8.9–10.3)
Chloride: 105 mmol/L (ref 98–111)
Creatinine: 0.78 mg/dL (ref 0.44–1.00)
GFR, Estimated: >60 mL/min (ref >60)
Glucose, Bld: 84 mg/dL (ref 70–99)
Potassium: 4.1 mmol/L (ref 3.5–5.1)
Sodium: 137 mmol/L (ref 135–145)
Total Bilirubin: 0.4 mg/dL (ref 0.3–1.2)
Total Protein: 6.6 g/dL (ref 6.5–8.1)

## 2021-07-19 LAB — CBC WITH DIFFERENTIAL (CANCER CENTER ONLY)
Abs Immature Granulocytes: 0.02 10*3/uL (ref 0.00–0.07)
Basophils Absolute: 0 10*3/uL (ref 0.0–0.1)
Basophils Relative: 1 %
Eosinophils Absolute: 0.1 10*3/uL (ref 0.0–0.5)
Eosinophils Relative: 2 %
HCT: 31.1 % — ABNORMAL LOW (ref 36.0–46.0)
Hemoglobin: 9.6 g/dL — ABNORMAL LOW (ref 12.0–15.0)
Immature Granulocytes: 0 %
Lymphocytes Relative: 16 %
Lymphs Abs: 1 10*3/uL (ref 0.7–4.0)
MCH: 26.7 pg (ref 26.0–34.0)
MCHC: 30.9 g/dL (ref 30.0–36.0)
MCV: 86.6 fL (ref 80.0–100.0)
Monocytes Absolute: 0.5 10*3/uL (ref 0.1–1.0)
Monocytes Relative: 7 %
Neutro Abs: 4.6 10*3/uL (ref 1.7–7.7)
Neutrophils Relative %: 74 %
Platelet Count: 165 10*3/uL (ref 150–400)
RBC: 3.59 MIL/uL — ABNORMAL LOW (ref 3.87–5.11)
RDW: 15.1 % (ref 11.5–15.5)
WBC Count: 6.2 10*3/uL (ref 4.0–10.5)
nRBC: 0 % (ref 0.0–0.2)

## 2021-07-19 MED ORDER — PALONOSETRON HCL INJECTION 0.25 MG/5ML
0.2500 mg | Freq: Once | INTRAVENOUS | Status: AC
  Administered 2021-07-19: 0.25 mg via INTRAVENOUS

## 2021-07-19 MED ORDER — SODIUM CHLORIDE 0.9% FLUSH
10.0000 mL | Freq: Once | INTRAVENOUS | Status: AC
  Administered 2021-07-19: 10 mL

## 2021-07-19 MED ORDER — SODIUM CHLORIDE 0.9 % IV SOLN
10.0000 mg | Freq: Once | INTRAVENOUS | Status: AC
  Administered 2021-07-19: 10 mg via INTRAVENOUS
  Filled 2021-07-19: qty 10

## 2021-07-19 MED ORDER — ATROPINE SULFATE 1 MG/ML IV SOLN
0.5000 mg | Freq: Once | INTRAVENOUS | Status: AC | PRN
  Administered 2021-07-19: 0.5 mg via INTRAVENOUS
  Filled 2021-07-19: qty 1

## 2021-07-19 MED ORDER — SODIUM CHLORIDE 0.9 % IV SOLN: Freq: Once | INTRAVENOUS | Status: AC

## 2021-07-19 MED ORDER — SODIUM CHLORIDE 0.9 % IV SOLN
5.0000 mg/kg | Freq: Once | INTRAVENOUS | Status: AC
  Administered 2021-07-19: 225 mg via INTRAVENOUS
  Filled 2021-07-19: qty 9

## 2021-07-19 MED ORDER — SODIUM CHLORIDE 0.9 % IV SOLN
130.0000 mg/m2 | Freq: Once | INTRAVENOUS | Status: AC
  Administered 2021-07-19: 180 mg via INTRAVENOUS
  Filled 2021-07-19: qty 9

## 2021-07-19 NOTE — Patient Instructions (Signed)
[facility]  Discharge Instructions: Thank you for choosing [facility] to provide your oncology and hematology care.   If you have a lab appointment with the [facility], please go directly to the [facility] and check in at the registration area.   Wear comfortable clothing and clothing appropriate for easy access to any Portacath or PICC line.   We strive to give you quality time with your provider. You may need to reschedule your appointment if you arrive late (15 or more minutes).  Arriving late affects you and other patients whose appointments are after yours.  Also, if you miss three or more appointments without notifying the office, you may be dismissed from the clinic at the providers discretion.      For prescription refill requests, have your pharmacy contact our office and allow 72 hours for refills to be completed.    Today you received the following chemotherapy and/or immunotherapy agents: bevacizumab and irinotecan       To help prevent nausea and vomiting after your treatment, we encourage you to take your nausea medication as directed.  BELOW ARE SYMPTOMS THAT SHOULD BE REPORTED IMMEDIATELY: *FEVER GREATER THAN 100.4 F (38 C) OR HIGHER *CHILLS OR SWEATING *NAUSEA AND VOMITING THAT IS NOT CONTROLLED WITH YOUR NAUSEA MEDICATION *UNUSUAL SHORTNESS OF BREATH *UNUSUAL BRUISING OR BLEEDING *URINARY PROBLEMS (pain or burning when urinating, or frequent urination) *BOWEL PROBLEMS (unusual diarrhea, constipation, pain near the anus) TENDERNESS IN MOUTH AND THROAT WITH OR WITHOUT PRESENCE OF ULCERS (sore throat, sores in mouth, or a toothache) UNUSUAL RASH, SWELLING OR PAIN  UNUSUAL VAGINAL DISCHARGE OR ITCHING   Items with * indicate a potential emergency and should be followed up as soon as possible or go to the Emergency Department if any problems should occur.  Please show the CHEMOTHERAPY ALERT CARD or IMMUNOTHERAPY ALERT  CARD at check-in to the Emergency Department and triage nurse.  Should you have questions after your visit or need to cancel or reschedule your appointment, please contact [facility]  Dept: [phone]  and follow the prompts.  Office hours are 8:00 a.m. to 4:30 p.m. Monday - Friday. Please note that voicemails left after 4:00 p.m. may not be returned until the following business day.  We are closed weekends and major holidays. You have access to a nurse at all times for urgent questions. Please call the main number to the clinic Dept: [phone] and follow the prompts.   For any non-urgent questions, you may also contact your provider using MyChart. We now offer e-Visits for anyone 82 and older to request care online for non-urgent symptoms. For details visit [email or web address].   Also download the MyChart app! Go to the app store, search "MyChart", open the app, select [facility], and log in with your MyChart username and password.  Due to Covid, a mask is required upon entering the hospital/clinic. If you do not have a mask, one will be given to you upon arrival. For doctor visits, patients may have 1 support person aged 37 or older with them. For treatment visits, patients cannot have anyone with them due to current Covid guidelines and our immunocompromised population.

## 2021-07-19 NOTE — Progress Notes (Signed)
[place]   [phone] [phone]   Clinic Follow up Note   Patient Care Team: Patient, No Pcp Per (Inactive) as PCP - General (General Practice) [name], NP as Nurse Practitioner (Oncology) [name], MD as Consulting Physician (Oncology) [name], RN (Inactive) as Oncology Nurse Navigator  Date of Service:  07/19/2021  CHIEF COMPLAINT: f/u of metastatic rectal cancer, h/o colon cancer  CURRENT THERAPY:  Maintenance irinotecan and bevacizumab, q2weeks, from 01/24/21  ASSESSMENT & PLAN:  Brittney Spencer is a 71 y.o. female with   1. Rectal adenocarcinoma, with liver and right pelvic metastasis, recurrence from previous colon cancer vs new primary   -Diagnosed on routine screening colonoscopy 05/2020, rectal mass biopsy showed invasive adenocarcinoma, arising from a tubular adenoma -Her 07/12/20 PET showed positive uptake of known rectal tumor and soft tissue mass within the right iliac fossa indicating recurrent prior colon cancer. There is also left lobe of liver lesion is FDG avid concerning for liver metastasis. pt declined liver biopsy -whether this is metastatic rectal or recurrent/metastatic colon cancer, this is likely not curable, but still treatable. She previously declined referral for HIPEC -She began first-line systemic chemo with dose-reduced FOLFIRI on 08/06/20. -Bevacizumab added, and irinotecan/5FU increased with cycle 2, but due to poor tolerance/diarrhea irinotecan was reduced with cycle 3 to 130 mg per metered squared, tolerated better -She changed to maintenance therapy with irinotecan and bevacizumab every 2 weeks on 01/24/21 -Restaging CT CAP 04/23/21 showed good partial response, continue current treatment. Will plan for repeat in mid February. -labs reviewed, overall stable. Her weight has been stable and over 100 since 04/25/21. She is tolerating treatment well.   2. Cancer of ascending colon, pT4bN1bM0,  stage IIIC, MSI-stable, (+) surgical margins at pelvic wall     -She was diagnosed in 12/2015. She is s/p right hemicolectomy with right salpingo oophorectomy and  adjuvant ChemoRT. -she declined adjuvant chemo due to the concern of side effects and impact on her quality of life. -Her 04/2019 CT scan was NED  -With recent rectal cancer diagnosis, her PET from 07/12/20 indicates soft tissue mass within the right iliac fossa is noted and consistent with local tumor recurrence from previous ascending colon tumor.   3. Mild Anemia -Has been stable and mild for the past year.  -worse lately due to rectal cancer and starting chemo -denies bleeding -monitoring, stable in 9-range.   4. HTN, Anxiety -She'll follow-up with her primary care physician and continue medication -She uses half tab Xanax as needed, mostly only on treatment days. -stable     PLAN: -proceed with irinotecan/beva today             -[name] injection on day 3  -return for next cycle as scheduled on 2/2 -plan to repeat CT in ~4 weeks   No problem-specific Assessment & Plan notes found for this encounter.   SUMMARY OF ONCOLOGIC HISTORY: Oncology History Overview Note  Cancer Staging Cancer of ascending colon [facility]) Staging form: Colon and Rectum, AJCC 7th Edition - Clinical stage from 02/07/2016: Stage IIIC (T4b, N1b, M0) - Signed by [name], MD on 03/04/2016 Laterality: Right Residual tumor (R): R2 - Macroscopic    Cancer of ascending colon ([place])  10/25/2015 Imaging   CT ABD/PELVIS:  Inflammatory changes inferior to the cecal tip appear improved, there is still irregular soft tissue thickening of the cecal tip, and there are adjacent prominent lymph nodes in the ileocolonic mesentery, measuring 13 mm  on image 49 and 8 mm on image 52. In addition, there is a 2.5 x 1.8 cm nodule on image 46 which has central low density. Therefore, these findings are moderately suspicious for an underlying cecal malignancy  with perforation.    01/19/2016 Procedure   COLONOSCOPY per Dr. [name]: Fungating, ulcerated mass almost obstructing mid ascending colon   01/19/2016 Initial Biopsy   Diagnosis Surgical [P], cecal mass - INVASIVE ADENOCARCINOMA WITH ULCERATION. - SEE COMMENT.   02/05/2016 Tumor Marker   Patient's tumor was tested for the following markers: CEA Results of the tumor marker test revealed 5.7.   02/07/2016 Initial Diagnosis   Cancer of ascending colon ([place])   02/07/2016 Definitive Surgery   Laparoscopic assisted right hemicolectomy and right salpingo oopherectomy--Dr. [name]   02/07/2016 Pathologic Stage   p T4 N1b   2/43 nodes +   02/07/2016 Pathology Results   MMR normal; G2 adenocarcinoma;proximal & distal margins negative; soft tissue mass on pelvic sidewall + for adenocarcinoma with positive margin MSI Stable   03/08/2016 Imaging   CT chest negative for metastasis.    03/19/2016 - 04/25/2016 Radiation Therapy   Adjuvant irradiation, 50 gray in 28 fractions   03/19/2016 - 04/22/2016 Chemotherapy   Xeloda 1500 mg twice daily, started on 03/19/2016, dose reduced to 1000 mg twice daily from week 3 due to neutropenia, and patient stopped 3 days before last dose radiation due to difficulty swallowing the pill    05/20/2016 -  Adjuvant Chemotherapy   Patient declined adjuvant chemotherapy   09/16/2016 Imaging   CT CAP w Contrast 1. No evidence of local tumor recurrence at the ileocolic anastomosis. 2. No findings suspicious for metastatic disease in the chest, abdomen or pelvis. 3. Nonspecific trace free fluid in the pelvic cul-de-sac. 4. Stable solitary 3 mm right upper lobe pulmonary nodule, for which 6 month stability has been demonstrated, probably benign. 5. Additional findings include stable right posterior pericardial cyst and small calcified uterine fibroids.   05/13/2017 Imaging   CT CAP W Contrast 05/13/17 IMPRESSION: 1. No current findings of residual or recurrent  malignancy. 2. Mild prominence of stool throughout the colon. Nondistended portions of the rectum. 3. Several tiny pulmonary nodules are stable from the earliest available comparison of 03/08/2016 and probably benign, but may merit surveillance. 4. Other imaging findings of potential clinical significance: Old granulomatous disease. Aortoiliac atherosclerotic vascular disease. Lumbar spondylosis and degenerative disc disease. Stable amount of trace free pelvic fluid.   04/27/2018 Imaging   04/27/2018 CT CAP IMPRESSION: Stable exam. No evidence of recurrent or metastatic carcinoma within the chest, abdomen, or pelvis   04/19/2019 Imaging   CT CAP W Contrast  IMPRESSION: Chest Impression:   1. No evidence of thoracic metastasis. 2. Stable small bilateral pulmonary nodules.   Abdomen / Pelvis Impression:   1. No evidence local colorectal carcinoma recurrence or metastasis in the abdomen pelvis. 2. Post RIGHT hemicolectomy.   01/22/2021 Imaging   CT CAP  IMPRESSION: CT CHEST IMPRESSION   1. Similar nonspecific pulmonary nodules. 2. New posterior left upper lobe reticulonodular opacity, suspicious for interval mild infection or inflammation. 3. No thoracic adenopathy.   CT ABDOMEN AND PELVIS IMPRESSION   1. Further decrease in size of high left hepatic lobe 3 mm low-density lesion. No new or progressive metastatic disease within the abdomen or pelvis. 2. Similar trace free pelvic fluid. 3. Similar nonspecific mid rectal wall thickening. 4.  Aortic Atherosclerosis (ICD10-I70.0).   04/23/2021 Imaging   CT CAP  IMPRESSION: 1. Treated metastatic lesion between segments 2 and 3 of the liver, slightly smaller and less distinct than prior examination. No other signs of definite metastatic disease elsewhere in the abdomen or pelvis. 2. Multiple small pulmonary nodules, stable compared to the prior examination, favored to be benign. No definitive findings to  suggest metastatic disease to the thorax. 3. Aortic atherosclerosis. 4. Additional incidental findings, as above.   Rectal cancer metastasized to liver ([place])  06/15/2020 Procedure   Screening Colonoscopy by Dr [name]  IMPRESSION - Decreased sphincter tone and internal hemorrhoids that prolapse with straining, but require manual replacement into the anal canal (Grade III) found on digital rectal exam. - Patent side-to-side ileo-colonic anastomosis, characterized by healthy appearing mucosa. - The examined portion of the ileum was normal. - One diminutive polyp in the proximal transverse colon, removed with a cold biopsy forceps. Resected and retrieved. - Likely malignant partially obstructing tumor in the mid rectum. Biopsied. Tattooed. - The examination was otherwise normal on direct and retroflexion views.   06/15/2020 Initial Biopsy   Diagnosis 1. Transverse Colon Polyp - HYPERPLASTIC POLYP 2. Rectum, biopsy - ADENOCARCINOMA ARISING IN A TUBULAR ADENOMA WITH HIGH-GRADE DYSPLASIA. SEE NOTE Diagnosis Note 2. Dr. [name] reviewed the case and concurs with the diagnosis. Dr. [name] was notified on 06/16/2020.   06/28/2020 Imaging   CT CAP  IMPRESSION: 1. New low-density focus in the anterior aspect of the lateral segment LEFT hepatic lobe measuring 1.2 x 1.0 cm, compatible with small metastatic lesion in the LEFT hepatic lobe. 2. Soft tissue in the RIGHT iliac fossa following RIGHT hemicolectomy invades the psoas musculature and is slowly enlarging over time, more linear on the prior study now highly concerning for recurrence/metastasis to this location. 3. Signs of enteritis, potentially post radiation changes of the small bowel. Tethered small bowel in the RIGHT lower quadrant shows focal thickening and narrowing suspicious for small bowel involvement and developing partial obstruction though currently contrast passes beyond this point into the colon. 4. Rectal thickening in  this patient with known rectal mass as described. 5. No evidence of metastatic disease in the chest. 6. Stable small pulmonary nodules. 7.  and aortic atherosclerosis.   Aortic Atherosclerosis (ICD10-I70.0) and Emphysema (ICD10-J43.9).   07/04/2020 Initial Diagnosis   Rectal cancer metastasized to liver ([place])   07/12/2020 PET scan   IMPRESSION: 1. Exam positive for FDG avid rectal tumor which corresponds to the recent colonoscopy findings. 2. FDG avid soft tissue mass within the right iliac fossa is noted and consistent with local tumor recurrence from previous ascending colon tumor. 3. Lateral segment left lobe of liver lesion is FDG avid concerning for liver metastasis. 4. No specific findings identified to suggest metastatic disease to the chest.   08/10/2020 -  Chemotherapy   First-line FOLFIRI q2weeks starting 08/10/20. dose reduced with cycle 1. Irinotecan/5FU increased and Bevacizumab added with cycle 2 on 08/23/2020    10/27/2020 Imaging   CT CAP  IMPRESSION: 1. Interval decrease in size of the hypermetabolic left hepatic lesion, consistent with metastatic disease. No new liver lesion evident. 2. Interval resolution of the hypermetabolic soft tissue lesion along the right iliac fossa with no measurable soft tissue lesion remaining at this location today. 3. Similar appearance of soft tissue fullness in the rectum at the site of the hypermetabolic lesion seen previously. 4. Stable tiny bilateral pulmonary nodules. Continued attention on follow-up recommended. 5. Small volume free fluid in the pelvis. 6. Aortic Atherosclerosis (ICD10-I70.0).   01/22/2021 Imaging  CT CAP  IMPRESSION: CT CHEST IMPRESSION   1. Similar nonspecific pulmonary nodules. 2. New posterior left upper lobe reticulonodular opacity, suspicious for interval mild infection or inflammation. 3. No thoracic adenopathy.   CT ABDOMEN AND PELVIS IMPRESSION   1. Further decrease in size of high left  hepatic lobe 3 mm low-density lesion. No new or progressive metastatic disease within the abdomen or pelvis. 2. Similar trace free pelvic fluid. 3. Similar nonspecific mid rectal wall thickening. 4.  Aortic Atherosclerosis (ICD10-I70.0).   04/23/2021 Imaging   CT CAP  IMPRESSION: 1. Treated metastatic lesion between segments 2 and 3 of the liver, slightly smaller and less distinct than prior examination. No other signs of definite metastatic disease elsewhere in the abdomen or pelvis. 2. Multiple small pulmonary nodules, stable compared to the prior examination, favored to be benign. No definitive findings to suggest metastatic disease to the thorax. 3. Aortic atherosclerosis. 4. Additional incidental findings, as above.      INTERVAL HISTORY:  Brittney Spencer is here for a follow up of metastatic rectal cancer. She was last seen by me on 07/05/21. She presents to the clinic alone. She continued to maintain her weight. She feels she is tolerating this regimen much better than the pump. She denies diarrhea, no longer needing lomotil.   All other systems were reviewed with the patient and are negative.  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 Some residual intraretinal fluid remains, very small localized subfoveal of fluid remains although this continues to slowly resorb. We'll continue to observe.   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  ALLERGIES:  is allergic to fish allergy, peanut-containing drug products, soy allergy, and buspirone.  MEDICATIONS:  Current Outpatient Medications  Medication Sig Dispense Refill   ALPRAZolam (XANAX) 0.25 MG tablet Take 1 tablet (0.25 mg total) by mouth daily as needed for anxiety. 30 tablet 0   Cholecalciferol (VITAMIN D3 PO) Take by mouth daily.     diphenoxylate-atropine (LOMOTIL) 2.5-0.025 MG tablet Take 2 tablets by mouth 4 (four) times daily as needed for diarrhea or loose stools. 45 tablet 3   docusate sodium (COLACE) 100 MG capsule 1 capsule as needed     lidocaine-prilocaine (  EMLA) cream Apply 1 application topically as needed. 30 g 1   NORVASC 2.5 MG tablet TAKE 1 TABLET BY MOUTH EVERY DAY 90 tablet 1   ondansetron (ZOFRAN) 8 MG tablet Take 1 tablet (8 mg total) by mouth every 8 (eight) hours as needed for nausea or vomiting. 20 tablet 2   Current Facility-Administered Medications  Medication Dose Route Frequency Provider Last Rate Last Admin   0.9 %  sodium chloride infusion  500 mL Intravenous Once Danis, Estill Cotta III, MD        PHYSICAL EXAMINATION: ECOG PERFORMANCE STATUS: 1 - Symptomatic but completely ambulatory  Vitals:   07/19/21 0909  BP: 131/78  Pulse: 82  Resp: 18  Temp: 98.3 F (36.8 C)  SpO2: 100%   Wt Readings from  Last 3 Encounters:  07/19/21 111 lb 3.2 oz (50.4 kg)  07/05/21 109 lb 9.6 oz (49.7 kg)  06/07/21 105 lb (47.6 kg)     GENERAL:alert, no distress and comfortable SKIN: skin color normal, no rashes or significant lesions EYES: normal, Conjunctiva are pink and non-injected, sclera clear  NEURO: alert & oriented x 3 with fluent speech  LABORATORY DATA:  I have reviewed the data as listed CBC Latest Ref Rng & Units 07/19/2021 07/05/2021 06/07/2021  WBC 4.0 - 10.5 K/uL 6.2 3.3(L) 3.5(L)  Hemoglobin 12.0 - 15.0 g/dL 9.6(L) 9.8(L) 9.8(L)  Hematocrit 36.0 - 46.0 % 31.1(L) 32.1(L) 32.2(L)  Platelets 150 - 400 K/uL 165 219 224     CMP Latest Ref Rng & Units 07/19/2021 07/05/2021 06/07/2021  Glucose 70 - 99 mg/dL 84 100(H) 83  BUN 8 - 23 mg/dL '17 19 19  ' Creatinine 0.44 - 1.00 mg/dL 0.78 0.78 0.76  Sodium 135 - 145 mmol/L 137 138 140  Potassium 3.5 - 5.1 mmol/L 4.1 4.1 4.1  Chloride 98 - 111 mmol/L 105 105 107  CO2 22 - 32 mmol/L '28 29 25  ' Calcium 8.9 - 10.3 mg/dL 9.0 8.7(L) 8.9  Total Protein 6.5 - 8.1 g/dL 6.6 6.9 6.7  Total Bilirubin 0.3 - 1.2 mg/dL 0.4 0.5 0.4  Alkaline Phos 38 - 126 U/L 72 56 61  AST 15 - 41 U/L 14(L) 18 17  ALT 0 - 44 U/L '8 8 9      ' RADIOGRAPHIC STUDIES: I have personally reviewed the radiological images as listed and agreed with the findings in the report. No results found.    Orders Placed This Encounter  Procedures   CT CHEST ABDOMEN PELVIS W CONTRAST    Standing Status:   Future    Standing Expiration Date:   07/19/2022    Order Specific Question:   Preferred imaging location?    Answer:   [facility]    Order Specific Question:   Release to patient    Answer:   Immediate    Order Specific Question:   Is Oral Contrast requested for this exam?    Answer:   Yes, Per Radiology protocol   All questions were answered. The patient knows to call the clinic with any problems, questions or concerns. No barriers to learning was detected. The total time  spent in the appointment was 30 minutes.     [name], MD 07/19/2021   I, Wilburn Mylar, am acting as scribe for [name], MD.   I have reviewed the above documentation for accuracy and completeness, and I agree with the above.

## 2021-07-21 ENCOUNTER — Other Ambulatory Visit: Payer: Self-pay

## 2021-07-21 ENCOUNTER — Inpatient Hospital Stay: Payer: Federal, State, Local not specified - PPO

## 2021-07-21 VITALS — BP 138/79 | HR 83 | Temp 97.9°F | Resp 16

## 2021-07-21 DIAGNOSIS — Z5112 Encounter for antineoplastic immunotherapy: Secondary | ICD-10-CM | POA: Diagnosis not present

## 2021-07-21 DIAGNOSIS — C20 Malignant neoplasm of rectum: Secondary | ICD-10-CM

## 2021-07-21 DIAGNOSIS — C787 Secondary malignant neoplasm of liver and intrahepatic bile duct: Secondary | ICD-10-CM

## 2021-07-21 MED ORDER — PEGFILGRASTIM-CBQV 6 MG/0.6ML ~~LOC~~ SOSY
6.0000 mg | PREFILLED_SYRINGE | Freq: Once | SUBCUTANEOUS | Status: AC
  Administered 2021-07-21: 6 mg via SUBCUTANEOUS

## 2021-07-23 ENCOUNTER — Telehealth: Payer: Self-pay | Admitting: Hematology

## 2021-07-23 NOTE — Telephone Encounter (Signed)
Scheduled follow-up appointments per 1/19 los. Patient is aware. °

## 2021-08-01 MED FILL — Dexamethasone Sodium Phosphate Inj 100 MG/10ML: INTRAMUSCULAR | Qty: 1 | Status: AC

## 2021-08-02 ENCOUNTER — Other Ambulatory Visit: Payer: Self-pay

## 2021-08-02 ENCOUNTER — Encounter: Payer: Self-pay | Admitting: Hematology

## 2021-08-02 ENCOUNTER — Inpatient Hospital Stay: Payer: Federal, State, Local not specified - PPO | Attending: Nurse Practitioner

## 2021-08-02 ENCOUNTER — Inpatient Hospital Stay: Payer: Federal, State, Local not specified - PPO

## 2021-08-02 ENCOUNTER — Inpatient Hospital Stay: Payer: Federal, State, Local not specified - PPO | Admitting: Hematology

## 2021-08-02 VITALS — BP 144/81 | HR 73 | Temp 98.3°F | Resp 18 | Ht 66.0 in | Wt 111.6 lb

## 2021-08-02 DIAGNOSIS — Z5112 Encounter for antineoplastic immunotherapy: Secondary | ICD-10-CM | POA: Diagnosis present

## 2021-08-02 DIAGNOSIS — C787 Secondary malignant neoplasm of liver and intrahepatic bile duct: Secondary | ICD-10-CM | POA: Diagnosis not present

## 2021-08-02 DIAGNOSIS — Z5111 Encounter for antineoplastic chemotherapy: Secondary | ICD-10-CM | POA: Insufficient documentation

## 2021-08-02 DIAGNOSIS — Z9221 Personal history of antineoplastic chemotherapy: Secondary | ICD-10-CM | POA: Insufficient documentation

## 2021-08-02 DIAGNOSIS — Z5189 Encounter for other specified aftercare: Secondary | ICD-10-CM | POA: Insufficient documentation

## 2021-08-02 DIAGNOSIS — C20 Malignant neoplasm of rectum: Secondary | ICD-10-CM

## 2021-08-02 DIAGNOSIS — F419 Anxiety disorder, unspecified: Secondary | ICD-10-CM | POA: Diagnosis not present

## 2021-08-02 DIAGNOSIS — Z95828 Presence of other vascular implants and grafts: Secondary | ICD-10-CM

## 2021-08-02 DIAGNOSIS — D649 Anemia, unspecified: Secondary | ICD-10-CM | POA: Diagnosis not present

## 2021-08-02 DIAGNOSIS — I10 Essential (primary) hypertension: Secondary | ICD-10-CM | POA: Insufficient documentation

## 2021-08-02 DIAGNOSIS — Z923 Personal history of irradiation: Secondary | ICD-10-CM | POA: Diagnosis not present

## 2021-08-02 DIAGNOSIS — C182 Malignant neoplasm of ascending colon: Secondary | ICD-10-CM | POA: Diagnosis present

## 2021-08-02 DIAGNOSIS — Z1509 Genetic susceptibility to other malignant neoplasm: Secondary | ICD-10-CM | POA: Insufficient documentation

## 2021-08-02 DIAGNOSIS — Z79899 Other long term (current) drug therapy: Secondary | ICD-10-CM | POA: Diagnosis not present

## 2021-08-02 LAB — CBC WITH DIFFERENTIAL (CANCER CENTER ONLY)
Abs Immature Granulocytes: 0.04 10*3/uL (ref 0.00–0.07)
Basophils Absolute: 0 10*3/uL (ref 0.0–0.1)
Basophils Relative: 0 %
Eosinophils Absolute: 0.1 10*3/uL (ref 0.0–0.5)
Eosinophils Relative: 2 %
HCT: 30.3 % — ABNORMAL LOW (ref 36.0–46.0)
Hemoglobin: 9.6 g/dL — ABNORMAL LOW (ref 12.0–15.0)
Immature Granulocytes: 1 %
Lymphocytes Relative: 17 %
Lymphs Abs: 1.1 10*3/uL (ref 0.7–4.0)
MCH: 26.9 pg (ref 26.0–34.0)
MCHC: 31.7 g/dL (ref 30.0–36.0)
MCV: 84.9 fL (ref 80.0–100.0)
Monocytes Absolute: 0.5 10*3/uL (ref 0.1–1.0)
Monocytes Relative: 8 %
Neutro Abs: 4.6 10*3/uL (ref 1.7–7.7)
Neutrophils Relative %: 72 %
Platelet Count: 205 10*3/uL (ref 150–400)
RBC: 3.57 MIL/uL — ABNORMAL LOW (ref 3.87–5.11)
RDW: 15.3 % (ref 11.5–15.5)
WBC Count: 6.4 10*3/uL (ref 4.0–10.5)
nRBC: 0 % (ref 0.0–0.2)

## 2021-08-02 LAB — CMP (CANCER CENTER ONLY)
ALT: 7 U/L (ref 0–44)
AST: 13 U/L — ABNORMAL LOW (ref 15–41)
Albumin: 3.7 g/dL (ref 3.5–5.0)
Alkaline Phosphatase: 88 U/L (ref 38–126)
Anion gap: 4 — ABNORMAL LOW (ref 5–15)
BUN: 13 mg/dL (ref 8–23)
CO2: 28 mmol/L (ref 22–32)
Calcium: 8.7 mg/dL — ABNORMAL LOW (ref 8.9–10.3)
Chloride: 106 mmol/L (ref 98–111)
Creatinine: 0.73 mg/dL (ref 0.44–1.00)
GFR, Estimated: >60 mL/min (ref >60)
Glucose, Bld: 83 mg/dL (ref 70–99)
Potassium: 4 mmol/L (ref 3.5–5.1)
Sodium: 138 mmol/L (ref 135–145)
Total Bilirubin: 0.3 mg/dL (ref 0.3–1.2)
Total Protein: 6.8 g/dL (ref 6.5–8.1)

## 2021-08-02 LAB — CEA (IN HOUSE-CHCC): CEA (CHCC-In House): 5.11 ng/mL — ABNORMAL HIGH (ref 0.00–5.00)

## 2021-08-02 MED ORDER — SODIUM CHLORIDE 0.9% FLUSH
10.0000 mL | INTRAVENOUS | Status: DC | PRN
  Administered 2021-08-02: 10 mL

## 2021-08-02 MED ORDER — ATROPINE SULFATE 1 MG/ML IV SOLN
0.5000 mg | Freq: Once | INTRAVENOUS | Status: AC | PRN
  Administered 2021-08-02: 0.5 mg via INTRAVENOUS
  Filled 2021-08-02: qty 1

## 2021-08-02 MED ORDER — PALONOSETRON HCL INJECTION 0.25 MG/5ML
0.2500 mg | Freq: Once | INTRAVENOUS | Status: AC
  Administered 2021-08-02: 0.25 mg via INTRAVENOUS
  Filled 2021-08-02: qty 5

## 2021-08-02 MED ORDER — SODIUM CHLORIDE 0.9 % IV SOLN
130.0000 mg/m2 | Freq: Once | INTRAVENOUS | Status: AC
  Administered 2021-08-02: 180 mg via INTRAVENOUS
  Filled 2021-08-02: qty 9

## 2021-08-02 MED ORDER — SODIUM CHLORIDE 0.9% FLUSH
10.0000 mL | Freq: Once | INTRAVENOUS | Status: AC
  Administered 2021-08-02: 10 mL

## 2021-08-02 MED ORDER — SODIUM CHLORIDE 0.9 % IV SOLN
10.0000 mg | Freq: Once | INTRAVENOUS | Status: AC
  Administered 2021-08-02: 10 mg via INTRAVENOUS
  Filled 2021-08-02: qty 10

## 2021-08-02 MED ORDER — SODIUM CHLORIDE 0.9 % IV SOLN
5.0000 mg/kg | Freq: Once | INTRAVENOUS | Status: AC
  Administered 2021-08-02: 250 mg via INTRAVENOUS
  Filled 2021-08-02: qty 10

## 2021-08-02 MED ORDER — HEPARIN SOD (PORK) LOCK FLUSH 100 UNIT/ML IV SOLN
500.0000 [IU] | Freq: Once | INTRAVENOUS | Status: AC | PRN
  Administered 2021-08-02: 500 [IU]

## 2021-08-02 MED ORDER — SODIUM CHLORIDE 0.9 % IV SOLN: Freq: Once | INTRAVENOUS | Status: AC

## 2021-08-02 NOTE — Progress Notes (Signed)
[facility]   [phone] Fax:(336) [phone]   Clinic Follow up Note   Patient Care Team: Patient, No Pcp Per (Inactive) as PCP - General (General Practice) [name], NP as Nurse Practitioner (Oncology) [name], MD as Consulting Physician (Oncology) [name], RN (Inactive) as Oncology Nurse Navigator  Date of Service:  08/02/2021  CHIEF COMPLAINT: f/u of metastatic rectal cancer, h/o colon cancer  CURRENT THERAPY:  Maintenance irinotecan and bevacizumab, q2weeks, from 01/24/21  ASSESSMENT & PLAN:  Brittney Spencer is a 71 y.o. female with   1. Rectal adenocarcinoma, with liver and right pelvic metastasis, recurrence from previous colon cancer vs new primary   -Diagnosed on routine screening colonoscopy 05/2020, rectal mass biopsy showed invasive adenocarcinoma, arising from a tubular adenoma -Her 07/12/20 PET showed positive uptake of known rectal tumor and soft tissue mass within the right iliac fossa indicating recurrent prior colon cancer. There is also left lobe of liver lesion is FDG avid concerning for liver metastasis. pt declined liver biopsy -whether this is metastatic rectal or recurrent/metastatic colon cancer, this is likely not curable, but still treatable. She previously declined referral for HIPEC -She began first-line systemic chemo with dose-reduced FOLFIRI on 08/06/20. -Bevacizumab added, and irinotecan/5FU increased with cycle 2, but due to poor tolerance/diarrhea irinotecan was reduced with cycle 3 to 130 mg per metered squared, tolerated better -She changed to maintenance therapy with irinotecan and bevacizumab every 2 weeks on 01/24/21 -Restaging CT CAP 04/23/21 showed good partial response, continue current treatment. She is scheduled for repeat on 08/13/21. -labs reviewed, overall stable. Her weight has been stable and over 100 since 04/25/21. She is tolerating treatment very well.   2. Cancer of ascending colon,  pT4bN1bM0, stage IIIC, MSI-stable, (+) surgical margins at pelvic wall     -She was diagnosed in 12/2015. She is s/p right hemicolectomy with right salpingo oophorectomy and  adjuvant ChemoRT. -she declined adjuvant chemo due to the concern of side effects and impact on her quality of life. -Her 04/2019 CT scan was NED  -With recent rectal cancer diagnosis, her PET from 07/12/20 indicates soft tissue mass within the right iliac fossa is noted and consistent with local tumor recurrence from previous ascending colon tumor.   3. Mild Anemia -Has been stable and mild for the past year.  -worse lately due to rectal cancer and starting chemo -denies bleeding -monitoring, stable in 9-range.   4. HTN, Anxiety -She'll follow-up with her primary care physician and continue medication -She uses half tab Xanax as needed, mostly only on treatment days. -stable     PLAN: -proceed with irinotecan/beva today at same dose              -[name] injection on day 3  -CT on 2/13 -lab, flush, f/u, and next irinotecan/beva on 2/16 and 3/2 as scheduled   No problem-specific Assessment & Plan notes found for this encounter.   SUMMARY OF ONCOLOGIC HISTORY: Oncology History Overview Note  Cancer Staging Cancer of ascending colon [facility]) Staging form: Colon and Rectum, AJCC 7th Edition - Clinical stage from 02/07/2016: Stage IIIC (T4b, N1b, M0) - Signed by [name], MD on 03/04/2016 Laterality: Right Residual tumor (R): R2 - Macroscopic    Cancer of ascending colon ([place])  10/25/2015 Imaging   CT ABD/PELVIS:  Inflammatory changes inferior to the cecal tip appear improved, there is still irregular soft tissue thickening of the cecal tip, and there are adjacent prominent lymph nodes in the  ileocolonic mesentery, measuring 13 mm on image 49 and 8 mm on image 52. In addition, there is a 2.5 x 1.8 cm nodule on image 46 which has central low density. Therefore, these findings are moderately suspicious for an  underlying cecal malignancy with perforation.    01/19/2016 Procedure   COLONOSCOPY per Dr. [name]: Fungating, ulcerated mass almost obstructing mid ascending colon   01/19/2016 Initial Biopsy   Diagnosis Surgical [P], cecal mass - INVASIVE ADENOCARCINOMA WITH ULCERATION. - SEE COMMENT.   02/05/2016 Tumor Marker   Patient's tumor was tested for the following markers: CEA Results of the tumor marker test revealed 5.7.   02/07/2016 Initial Diagnosis   Cancer of ascending colon ([place])   02/07/2016 Definitive Surgery   Laparoscopic assisted right hemicolectomy and right salpingo oopherectomy--Dr. [name]   02/07/2016 Pathologic Stage   p T4 N1b   2/43 nodes +   02/07/2016 Pathology Results   MMR normal; G2 adenocarcinoma;proximal & distal margins negative; soft tissue mass on pelvic sidewall + for adenocarcinoma with positive margin MSI Stable   03/08/2016 Imaging   CT chest negative for metastasis.    03/19/2016 - 04/25/2016 Radiation Therapy   Adjuvant irradiation, 50 gray in 28 fractions   03/19/2016 - 04/22/2016 Chemotherapy   Xeloda 1500 mg twice daily, started on 03/19/2016, dose reduced to 1000 mg twice daily from week 3 due to neutropenia, and patient stopped 3 days before last dose radiation due to difficulty swallowing the pill    05/20/2016 -  Adjuvant Chemotherapy   Patient declined adjuvant chemotherapy   09/16/2016 Imaging   CT CAP w Contrast 1. No evidence of local tumor recurrence at the ileocolic anastomosis. 2. No findings suspicious for metastatic disease in the chest, abdomen or pelvis. 3. Nonspecific trace free fluid in the pelvic cul-de-sac. 4. Stable solitary 3 mm right upper lobe pulmonary nodule, for which 6 month stability has been demonstrated, probably benign. 5. Additional findings include stable right posterior pericardial cyst and small calcified uterine fibroids.   05/13/2017 Imaging   CT CAP W Contrast 05/13/17 IMPRESSION: 1. No current findings of  residual or recurrent malignancy. 2. Mild prominence of stool throughout the colon. Nondistended portions of the rectum. 3. Several tiny pulmonary nodules are stable from the earliest available comparison of 03/08/2016 and probably benign, but may merit surveillance. 4. Other imaging findings of potential clinical significance: Old granulomatous disease. Aortoiliac atherosclerotic vascular disease. Lumbar spondylosis and degenerative disc disease. Stable amount of trace free pelvic fluid.   04/27/2018 Imaging   04/27/2018 CT CAP IMPRESSION: Stable exam. No evidence of recurrent or metastatic carcinoma within the chest, abdomen, or pelvis   04/19/2019 Imaging   CT CAP W Contrast  IMPRESSION: Chest Impression:   1. No evidence of thoracic metastasis. 2. Stable small bilateral pulmonary nodules.   Abdomen / Pelvis Impression:   1. No evidence local colorectal carcinoma recurrence or metastasis in the abdomen pelvis. 2. Post RIGHT hemicolectomy.   01/22/2021 Imaging   CT CAP  IMPRESSION: CT CHEST IMPRESSION   1. Similar nonspecific pulmonary nodules. 2. New posterior left upper lobe reticulonodular opacity, suspicious for interval mild infection or inflammation. 3. No thoracic adenopathy.   CT ABDOMEN AND PELVIS IMPRESSION   1. Further decrease in size of high left hepatic lobe 3 mm low-density lesion. No new or progressive metastatic disease within the abdomen or pelvis. 2. Similar trace free pelvic fluid. 3. Similar nonspecific mid rectal wall thickening. 4.  Aortic Atherosclerosis (ICD10-I70.0).   04/23/2021  Imaging   CT CAP  IMPRESSION: 1. Treated metastatic lesion between segments 2 and 3 of the liver, slightly smaller and less distinct than prior examination. No other signs of definite metastatic disease elsewhere in the abdomen or pelvis. 2. Multiple small pulmonary nodules, stable compared to the prior examination, favored to be benign. No definitive  findings to suggest metastatic disease to the thorax. 3. Aortic atherosclerosis. 4. Additional incidental findings, as above.   Rectal cancer metastasized to liver ([place])  06/15/2020 Procedure   Screening Colonoscopy by Dr [name]  IMPRESSION - Decreased sphincter tone and internal hemorrhoids that prolapse with straining, but require manual replacement into the anal canal (Grade III) found on digital rectal exam. - Patent side-to-side ileo-colonic anastomosis, characterized by healthy appearing mucosa. - The examined portion of the ileum was normal. - One diminutive polyp in the proximal transverse colon, removed with a cold biopsy forceps. Resected and retrieved. - Likely malignant partially obstructing tumor in the mid rectum. Biopsied. Tattooed. - The examination was otherwise normal on direct and retroflexion views.   06/15/2020 Initial Biopsy   Diagnosis 1. Transverse Colon Polyp - HYPERPLASTIC POLYP 2. Rectum, biopsy - ADENOCARCINOMA ARISING IN A TUBULAR ADENOMA WITH HIGH-GRADE DYSPLASIA. SEE NOTE Diagnosis Note 2. Dr. [name] reviewed the case and concurs with the diagnosis. Dr. [name] was notified on 06/16/2020.   06/28/2020 Imaging   CT CAP  IMPRESSION: 1. New low-density focus in the anterior aspect of the lateral segment LEFT hepatic lobe measuring 1.2 x 1.0 cm, compatible with small metastatic lesion in the LEFT hepatic lobe. 2. Soft tissue in the RIGHT iliac fossa following RIGHT hemicolectomy invades the psoas musculature and is slowly enlarging over time, more linear on the prior study now highly concerning for recurrence/metastasis to this location. 3. Signs of enteritis, potentially post radiation changes of the small bowel. Tethered small bowel in the RIGHT lower quadrant shows focal thickening and narrowing suspicious for small bowel involvement and developing partial obstruction though currently contrast passes beyond this point into the colon. 4. Rectal  thickening in this patient with known rectal mass as described. 5. No evidence of metastatic disease in the chest. 6. Stable small pulmonary nodules. 7.  and aortic atherosclerosis.   Aortic Atherosclerosis (ICD10-I70.0) and Emphysema (ICD10-J43.9).   07/04/2020 Initial Diagnosis   Rectal cancer metastasized to liver ([place])   07/12/2020 PET scan   IMPRESSION: 1. Exam positive for FDG avid rectal tumor which corresponds to the recent colonoscopy findings. 2. FDG avid soft tissue mass within the right iliac fossa is noted and consistent with local tumor recurrence from previous ascending colon tumor. 3. Lateral segment left lobe of liver lesion is FDG avid concerning for liver metastasis. 4. No specific findings identified to suggest metastatic disease to the chest.   08/10/2020 -  Chemotherapy   First-line FOLFIRI q2weeks starting 08/10/20. dose reduced with cycle 1. Irinotecan/5FU increased and Bevacizumab added with cycle 2 on 08/23/2020    10/27/2020 Imaging   CT CAP  IMPRESSION: 1. Interval decrease in size of the hypermetabolic left hepatic lesion, consistent with metastatic disease. No new liver lesion evident. 2. Interval resolution of the hypermetabolic soft tissue lesion along the right iliac fossa with no measurable soft tissue lesion remaining at this location today. 3. Similar appearance of soft tissue fullness in the rectum at the site of the hypermetabolic lesion seen previously. 4. Stable tiny bilateral pulmonary nodules. Continued attention on follow-up recommended. 5. Small volume free fluid in the pelvis. 6. Aortic  Atherosclerosis (ICD10-I70.0).   01/22/2021 Imaging   CT CAP  IMPRESSION: CT CHEST IMPRESSION   1. Similar nonspecific pulmonary nodules. 2. New posterior left upper lobe reticulonodular opacity, suspicious for interval mild infection or inflammation. 3. No thoracic adenopathy.   CT ABDOMEN AND PELVIS IMPRESSION   1. Further decrease in size  of high left hepatic lobe 3 mm low-density lesion. No new or progressive metastatic disease within the abdomen or pelvis. 2. Similar trace free pelvic fluid. 3. Similar nonspecific mid rectal wall thickening. 4.  Aortic Atherosclerosis (ICD10-I70.0).   04/23/2021 Imaging   CT CAP  IMPRESSION: 1. Treated metastatic lesion between segments 2 and 3 of the liver, slightly smaller and less distinct than prior examination. No other signs of definite metastatic disease elsewhere in the abdomen or pelvis. 2. Multiple small pulmonary nodules, stable compared to the prior examination, favored to be benign. No definitive findings to suggest metastatic disease to the thorax. 3. Aortic atherosclerosis. 4. Additional incidental findings, as above.      INTERVAL HISTORY:  Brittney Spencer is here for a follow up of metastatic rectal cancer. She was last seen by me on 07/19/21. She presents to the clinic alone. She reports she is doing very well; she is pleased with how well she is doing.   All other systems were reviewed with the patient and are negative.  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 Some residual intraretinal fluid remains, very small localized subfoveal of fluid remains although this continues to slowly resorb. We'll continue to observe.   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  ALLERGIES:  is allergic to fish allergy, peanut-containing drug products, soy allergy, and buspirone.  MEDICATIONS:  Current Outpatient Medications  Medication Sig Dispense Refill   ALPRAZolam (XANAX) 0.25 MG tablet Take 1 tablet (0.25 mg total) by mouth daily as needed for anxiety. 30 tablet 0   Cholecalciferol (VITAMIN D3 PO) Take by mouth daily.     diphenoxylate-atropine (LOMOTIL) 2.5-0.025 MG tablet Take 2 tablets by mouth 4 (four) times daily as needed for diarrhea or loose stools. 45 tablet 3   docusate sodium (COLACE) 100 MG capsule 1 capsule as needed     lidocaine-prilocaine (EMLA)  cream Apply 1 application topically as needed. 30 g 1   NORVASC 2.5 MG tablet TAKE 1 TABLET BY MOUTH EVERY DAY 90 tablet 1   ondansetron (ZOFRAN) 8 MG tablet Take 1 tablet (8 mg total) by mouth every 8 (eight) hours as needed for nausea or vomiting. 20 tablet 2   Current Facility-Administered Medications  Medication Dose Route Frequency Provider Last Rate Last Admin   0.9 %  sodium chloride infusion  500 mL Intravenous Once Doran Stabler, MD       Facility-Administered Medications Ordered in Other Visits  Medication Dose Route Frequency Provider Last Rate Last Admin   sodium chloride flush (NS) 0.9 % injection 10 mL  10 mL Intracatheter PRN [name], MD   10 mL at 08/02/21 1256    PHYSICAL EXAMINATION: ECOG PERFORMANCE  STATUS: 1 - Symptomatic but completely ambulatory  Vitals:   08/02/21 0939  BP: (!) 144/81  Pulse: 73  Resp: 18  Temp: 98.3 F (36.8 C)  SpO2: 100%   Wt Readings from Last 3 Encounters:  08/02/21 111 lb 9.6 oz (50.6 kg)  07/19/21 111 lb 3.2 oz (50.4 kg)  07/05/21 109 lb 9.6 oz (49.7 kg)    GENERAL:alert, no distress and comfortable SKIN: skin color normal, no rashes or significant lesions EYES: normal, Conjunctiva are pink and non-injected, sclera clear  NEURO: alert & oriented x 3 with fluent speech  LABORATORY DATA:  I have reviewed the data as listed CBC Latest Ref Rng & Units 08/02/2021 07/19/2021 07/05/2021  WBC 4.0 - 10.5 K/uL 6.4 6.2 3.3(L)  Hemoglobin 12.0 - 15.0 g/dL 9.6(L) 9.6(L) 9.8(L)  Hematocrit 36.0 - 46.0 % 30.3(L) 31.1(L) 32.1(L)  Platelets 150 - 400 K/uL 205 165 219     CMP Latest Ref Rng & Units 08/02/2021 07/19/2021 07/05/2021  Glucose 70 - 99 mg/dL 83 84 100(H)  BUN 8 - 23 mg/dL '13 17 19  ' Creatinine 0.44 - 1.00 mg/dL 0.73 0.78 0.78  Sodium 135 - 145 mmol/L 138 137 138  Potassium 3.5 - 5.1 mmol/L 4.0 4.1 4.1  Chloride 98 - 111 mmol/L 106 105 105  CO2 22 - 32 mmol/L '28 28 29  ' Calcium 8.9 - 10.3 mg/dL 8.7(L) 9.0 8.7(L)  Total Protein 6.5 - 8.1 g/dL 6.8 6.6 6.9  Total Bilirubin 0.3 - 1.2 mg/dL 0.3 0.4 0.5  Alkaline Phos 38 - 126 U/L 88 72 56  AST 15 - 41 U/L 13(L) 14(L) 18  ALT 0 - 44 U/L '7 8 8      ' RADIOGRAPHIC STUDIES: I have personally reviewed the radiological images as listed and agreed with the findings in the report. No results found.    No orders of the defined types were placed in this encounter.  All questions were answered. The patient knows to call the clinic with any problems, questions or concerns. No barriers to learning was detected.      [name], MD 08/02/2021   I, Wilburn Mylar, am acting as scribe for [name], MD.   I have reviewed the above documentation for accuracy and completeness, and I agree with the above.

## 2021-08-02 NOTE — Progress Notes (Signed)
Per Dr. Burr Medico, ok to use today's wt to recalculate Bevacizumab dose.  Kennith Center, Pharm.D., CPP 08/02/2021@10 :05 AM

## 2021-08-02 NOTE — Patient Instructions (Signed)
[facility]  Discharge Instructions: Thank you for choosing [facility] to provide your oncology and hematology care.   If you have a lab appointment with the [facility], please go directly to the [facility] and check in at the registration area.   Wear comfortable clothing and clothing appropriate for easy access to any Portacath or PICC line.   We strive to give you quality time with your provider. You may need to reschedule your appointment if you arrive late (15 or more minutes).  Arriving late affects you and other patients whose appointments are after yours.  Also, if you miss three or more appointments without notifying the office, you may be dismissed from the clinic at the providers discretion.      For prescription refill requests, have your pharmacy contact our office and allow 72 hours for refills to be completed.    Today you received the following chemotherapy and/or immunotherapy agents: bevacizumab and irinotecan       To help prevent nausea and vomiting after your treatment, we encourage you to take your nausea medication as directed.  BELOW ARE SYMPTOMS THAT SHOULD BE REPORTED IMMEDIATELY: *FEVER GREATER THAN 100.4 F (38 C) OR HIGHER *CHILLS OR SWEATING *NAUSEA AND VOMITING THAT IS NOT CONTROLLED WITH YOUR NAUSEA MEDICATION *UNUSUAL SHORTNESS OF BREATH *UNUSUAL BRUISING OR BLEEDING *URINARY PROBLEMS (pain or burning when urinating, or frequent urination) *BOWEL PROBLEMS (unusual diarrhea, constipation, pain near the anus) TENDERNESS IN MOUTH AND THROAT WITH OR WITHOUT PRESENCE OF ULCERS (sore throat, sores in mouth, or a toothache) UNUSUAL RASH, SWELLING OR PAIN  UNUSUAL VAGINAL DISCHARGE OR ITCHING   Items with * indicate a potential emergency and should be followed up as soon as possible or go to the Emergency Department if any problems should occur.  Please show the CHEMOTHERAPY ALERT CARD or IMMUNOTHERAPY ALERT  CARD at check-in to the Emergency Department and triage nurse.  Should you have questions after your visit or need to cancel or reschedule your appointment, please contact [facility]  Dept: [phone]  and follow the prompts.  Office hours are 8:00 a.m. to 4:30 p.m. Monday - Friday. Please note that voicemails left after 4:00 p.m. may not be returned until the following business day.  We are closed weekends and major holidays. You have access to a nurse at all times for urgent questions. Please call the main number to the clinic Dept: [phone] and follow the prompts.   For any non-urgent questions, you may also contact your provider using MyChart. We now offer e-Visits for anyone 40 and older to request care online for non-urgent symptoms. For details visit [email or web address].   Also download the MyChart app! Go to the app store, search "MyChart", open the app, select , and log in with your MyChart username and password.  Due to Covid, a mask is required upon entering the hospital/clinic. If you do not have a mask, one will be given to you upon arrival. For doctor visits, patients may have 1 support person aged 8 or older with them. For treatment visits, patients cannot have anyone with them due to current Covid guidelines and our immunocompromised population.

## 2021-08-04 ENCOUNTER — Other Ambulatory Visit: Payer: Self-pay

## 2021-08-04 ENCOUNTER — Inpatient Hospital Stay: Payer: Federal, State, Local not specified - PPO

## 2021-08-04 VITALS — BP 137/84 | HR 85 | Temp 97.8°F | Resp 17 | Ht 66.0 in

## 2021-08-04 DIAGNOSIS — C787 Secondary malignant neoplasm of liver and intrahepatic bile duct: Secondary | ICD-10-CM

## 2021-08-04 DIAGNOSIS — Z5112 Encounter for antineoplastic immunotherapy: Secondary | ICD-10-CM | POA: Diagnosis not present

## 2021-08-04 DIAGNOSIS — C20 Malignant neoplasm of rectum: Secondary | ICD-10-CM

## 2021-08-04 MED ORDER — PEGFILGRASTIM-CBQV 6 MG/0.6ML ~~LOC~~ SOSY
6.0000 mg | PREFILLED_SYRINGE | Freq: Once | SUBCUTANEOUS | Status: AC
  Administered 2021-08-04: 6 mg via SUBCUTANEOUS
  Filled 2021-08-04: qty 0.6

## 2021-08-04 NOTE — Patient Instructions (Signed)

## 2021-08-13 ENCOUNTER — Other Ambulatory Visit: Payer: Self-pay

## 2021-08-13 ENCOUNTER — Encounter (HOSPITAL_COMMUNITY): Payer: Self-pay

## 2021-08-13 ENCOUNTER — Ambulatory Visit (HOSPITAL_COMMUNITY)
Admission: RE | Admit: 2021-08-13 | Discharge: 2021-08-13 | Disposition: A | Discharge status: Home / Self Care | Payer: Federal, State, Local not specified - PPO | Source: Ambulatory Visit | Attending: Hematology | Admitting: Hematology

## 2021-08-13 DIAGNOSIS — C20 Malignant neoplasm of rectum: Secondary | ICD-10-CM | POA: Diagnosis present

## 2021-08-13 DIAGNOSIS — C787 Secondary malignant neoplasm of liver and intrahepatic bile duct: Secondary | ICD-10-CM | POA: Diagnosis present

## 2021-08-13 MED ORDER — IOHEXOL 300 MG/ML  SOLN
100.0000 mL | Freq: Once | INTRAMUSCULAR | Status: AC | PRN
  Administered 2021-08-13: 100 mL via INTRAVENOUS

## 2021-08-13 MED ORDER — SODIUM CHLORIDE (PF) 0.9 % IJ SOLN
INTRAMUSCULAR | Status: AC
  Filled 2021-08-13: qty 50

## 2021-08-15 MED FILL — Dexamethasone Sodium Phosphate Inj 100 MG/10ML: INTRAMUSCULAR | Qty: 1 | Status: AC

## 2021-08-16 ENCOUNTER — Inpatient Hospital Stay: Payer: Federal, State, Local not specified - PPO | Admitting: Hematology

## 2021-08-16 ENCOUNTER — Inpatient Hospital Stay: Payer: Federal, State, Local not specified - PPO

## 2021-08-16 ENCOUNTER — Other Ambulatory Visit: Payer: Self-pay

## 2021-08-16 ENCOUNTER — Other Ambulatory Visit: Payer: Self-pay | Admitting: Hematology

## 2021-08-16 VITALS — BP 135/75 | HR 86 | Temp 98.6°F | Resp 18 | Ht 66.0 in | Wt 110.8 lb

## 2021-08-16 DIAGNOSIS — C20 Malignant neoplasm of rectum: Secondary | ICD-10-CM | POA: Diagnosis not present

## 2021-08-16 DIAGNOSIS — C787 Secondary malignant neoplasm of liver and intrahepatic bile duct: Secondary | ICD-10-CM

## 2021-08-16 DIAGNOSIS — Z5112 Encounter for antineoplastic immunotherapy: Secondary | ICD-10-CM | POA: Diagnosis not present

## 2021-08-16 DIAGNOSIS — C182 Malignant neoplasm of ascending colon: Secondary | ICD-10-CM | POA: Diagnosis not present

## 2021-08-16 DIAGNOSIS — Z95828 Presence of other vascular implants and grafts: Secondary | ICD-10-CM

## 2021-08-16 LAB — CBC WITH DIFFERENTIAL (CANCER CENTER ONLY)
Abs Immature Granulocytes: 0.03 10*3/uL (ref 0.00–0.07)
Basophils Absolute: 0 10*3/uL (ref 0.0–0.1)
Basophils Relative: 0 %
Eosinophils Absolute: 0.1 10*3/uL (ref 0.0–0.5)
Eosinophils Relative: 2 %
HCT: 30.8 % — ABNORMAL LOW (ref 36.0–46.0)
Hemoglobin: 9.5 g/dL — ABNORMAL LOW (ref 12.0–15.0)
Immature Granulocytes: 1 %
Lymphocytes Relative: 21 %
Lymphs Abs: 1.3 10*3/uL (ref 0.7–4.0)
MCH: 26.4 pg (ref 26.0–34.0)
MCHC: 30.8 g/dL (ref 30.0–36.0)
MCV: 85.6 fL (ref 80.0–100.0)
Monocytes Absolute: 0.5 10*3/uL (ref 0.1–1.0)
Monocytes Relative: 9 %
Neutro Abs: 4.2 10*3/uL (ref 1.7–7.7)
Neutrophils Relative %: 67 %
Platelet Count: 190 10*3/uL (ref 150–400)
RBC: 3.6 MIL/uL — ABNORMAL LOW (ref 3.87–5.11)
RDW: 15.2 % (ref 11.5–15.5)
WBC Count: 6.3 10*3/uL (ref 4.0–10.5)
nRBC: 0 % (ref 0.0–0.2)

## 2021-08-16 LAB — CMP (CANCER CENTER ONLY)
ALT: 8 U/L (ref 0–44)
AST: 14 U/L — ABNORMAL LOW (ref 15–41)
Albumin: 3.7 g/dL (ref 3.5–5.0)
Alkaline Phosphatase: 82 U/L (ref 38–126)
Anion gap: 4 — ABNORMAL LOW (ref 5–15)
BUN: 14 mg/dL (ref 8–23)
CO2: 29 mmol/L (ref 22–32)
Calcium: 8.8 mg/dL — ABNORMAL LOW (ref 8.9–10.3)
Chloride: 105 mmol/L (ref 98–111)
Creatinine: 0.71 mg/dL (ref 0.44–1.00)
GFR, Estimated: >60 mL/min (ref >60)
Glucose, Bld: 80 mg/dL (ref 70–99)
Potassium: 3.9 mmol/L (ref 3.5–5.1)
Sodium: 138 mmol/L (ref 135–145)
Total Bilirubin: 0.3 mg/dL (ref 0.3–1.2)
Total Protein: 6.6 g/dL (ref 6.5–8.1)

## 2021-08-16 LAB — CEA (IN HOUSE-CHCC): CEA (CHCC-In House): 4.89 ng/mL (ref 0.00–5.00)

## 2021-08-16 LAB — TOTAL PROTEIN, URINE DIPSTICK: Protein, ur: NEGATIVE mg/dL

## 2021-08-16 MED ORDER — SODIUM CHLORIDE 0.9 % IV SOLN
10.0000 mg | Freq: Once | INTRAVENOUS | Status: AC
  Administered 2021-08-16: 10 mg via INTRAVENOUS
  Filled 2021-08-16: qty 10

## 2021-08-16 MED ORDER — SODIUM CHLORIDE 0.9% FLUSH
10.0000 mL | Freq: Once | INTRAVENOUS | Status: AC
  Administered 2021-08-16: 10 mL

## 2021-08-16 MED ORDER — HEPARIN SOD (PORK) LOCK FLUSH 100 UNIT/ML IV SOLN
500.0000 [IU] | Freq: Once | INTRAVENOUS | Status: AC | PRN
  Administered 2021-08-16: 500 [IU]

## 2021-08-16 MED ORDER — PALONOSETRON HCL INJECTION 0.25 MG/5ML
0.2500 mg | Freq: Once | INTRAVENOUS | Status: AC
  Administered 2021-08-16: 0.25 mg via INTRAVENOUS
  Filled 2021-08-16: qty 5

## 2021-08-16 MED ORDER — SODIUM CHLORIDE 0.9 % IV SOLN
5.0000 mg/kg | Freq: Once | INTRAVENOUS | Status: AC
  Administered 2021-08-16: 225 mg via INTRAVENOUS
  Filled 2021-08-16: qty 9

## 2021-08-16 MED ORDER — ATROPINE SULFATE 1 MG/ML IV SOLN
0.5000 mg | Freq: Once | INTRAVENOUS | Status: AC | PRN
  Administered 2021-08-16: 0.5 mg via INTRAVENOUS
  Filled 2021-08-16: qty 1

## 2021-08-16 MED ORDER — SODIUM CHLORIDE 0.9 % IV SOLN
130.0000 mg/m2 | Freq: Once | INTRAVENOUS | Status: AC
  Administered 2021-08-16: 180 mg via INTRAVENOUS
  Filled 2021-08-16: qty 9

## 2021-08-16 MED ORDER — SODIUM CHLORIDE 0.9% FLUSH
10.0000 mL | INTRAVENOUS | Status: DC | PRN
  Administered 2021-08-16: 10 mL

## 2021-08-16 MED ORDER — SODIUM CHLORIDE 0.9 % IV SOLN: Freq: Once | INTRAVENOUS | Status: AC

## 2021-08-16 NOTE — Patient Instructions (Signed)
[facility]  Discharge Instructions: Thank you for choosing [facility] to provide your oncology and hematology care.   If you have a lab appointment with the [facility], please go directly to the [facility] and check in at the registration area.   Wear comfortable clothing and clothing appropriate for easy access to any Portacath or PICC line.   We strive to give you quality time with your provider. You may need to reschedule your appointment if you arrive late (15 or more minutes).  Arriving late affects you and other patients whose appointments are after yours.  Also, if you miss three or more appointments without notifying the office, you may be dismissed from the clinic at the providers discretion.      For prescription refill requests, have your pharmacy contact our office and allow 72 hours for refills to be completed.    Today you received the following chemotherapy and/or immunotherapy agents: bevucizamab, irinotecan      To help prevent nausea and vomiting after your treatment, we encourage you to take your nausea medication as directed.  BELOW ARE SYMPTOMS THAT SHOULD BE REPORTED IMMEDIATELY: *FEVER GREATER THAN 100.4 F (38 C) OR HIGHER *CHILLS OR SWEATING *NAUSEA AND VOMITING THAT IS NOT CONTROLLED WITH YOUR NAUSEA MEDICATION *UNUSUAL SHORTNESS OF BREATH *UNUSUAL BRUISING OR BLEEDING *URINARY PROBLEMS (pain or burning when urinating, or frequent urination) *BOWEL PROBLEMS (unusual diarrhea, constipation, pain near the anus) TENDERNESS IN MOUTH AND THROAT WITH OR WITHOUT PRESENCE OF ULCERS (sore throat, sores in mouth, or a toothache) UNUSUAL RASH, SWELLING OR PAIN  UNUSUAL VAGINAL DISCHARGE OR ITCHING   Items with * indicate a potential emergency and should be followed up as soon as possible or go to the Emergency Department if any problems should occur.  Please show the CHEMOTHERAPY ALERT CARD or IMMUNOTHERAPY ALERT CARD  at check-in to the Emergency Department and triage nurse.  Should you have questions after your visit or need to cancel or reschedule your appointment, please contact [facility]  Dept: [phone]  and follow the prompts.  Office hours are 8:00 a.m. to 4:30 p.m. Monday - Friday. Please note that voicemails left after 4:00 p.m. may not be returned until the following business day.  We are closed weekends and major holidays. You have access to a nurse at all times for urgent questions. Please call the main number to the clinic Dept: [phone] and follow the prompts.   For any non-urgent questions, you may also contact your provider using MyChart. We now offer e-Visits for anyone 71 and older to request care online for non-urgent symptoms. For details visit [email or web address].   Also download the MyChart app! Go to the app store, search "MyChart", open the app, select [facility], and log in with your MyChart username and password.  Due to Covid, a mask is required upon entering the hospital/clinic. If you do not have a mask, one will be given to you upon arrival. For doctor visits, patients may have 1 support person aged 71 or older with them. For treatment visits, patients cannot have anyone with them due to current Covid guidelines and our immunocompromised population.

## 2021-08-16 NOTE — Progress Notes (Signed)
Brittney Spencer   [phone] [phone]   Clinic Follow up Note   Patient Care Team: Patient, No Pcp Per (Inactive) as PCP - General (General Practice) [name], NP as Nurse Practitioner (Oncology) [name], MD as Consulting Physician (Oncology) [name], RN (Inactive) as Oncology Nurse Navigator  Date of Service:  08/16/2021  CHIEF COMPLAINT: f/u of metastatic rectal cancer, h/o colon cancer  CURRENT THERAPY:  Maintenance irinotecan and bevacizumab, q2weeks, from 01/24/21  ASSESSMENT & PLAN:  Brittney Spencer is a 71 y.o. female with   1. Rectal adenocarcinoma, with liver and right pelvic metastasis, recurrence from previous colon cancer vs new primary   -Diagnosed on routine screening colonoscopy 05/2020, rectal mass biopsy showed invasive adenocarcinoma, arising from a tubular adenoma -Her 07/12/20 PET showed positive uptake of known rectal tumor and soft tissue mass within the right iliac fossa indicating recurrent prior colon cancer. There is also left lobe of liver lesion is FDG avid concerning for liver metastasis. pt declined liver biopsy -whether this is metastatic rectal or recurrent/metastatic colon cancer, this is not curable, but still treatable. She previously declined referral for HIPEC -She began first-line chemo with dose-reduced FOLFIRI on 08/06/20. -Bevacizumab added, and irinotecan/5FU increased with cycle 2, but due to poor tolerance/diarrhea irinotecan was reduced with cycle 3 to 130 mg per metered squared, tolerated better -She changed to maintenance therapy with irinotecan and bevacizumab every 2 weeks on 01/24/21 -Restaging CT CAP 08/13/21 showed continued good response to treatment, previous liver and right pelvic metastasis are not visible on current scan, and no new metastatic disease. I reviewed the results with her today. We will continue maintenance therapy.  She is very pleased with her results, and agree to  continue hemotherapy -labs reviewed, overall stable. Her weight has been stable and over 100 since 04/25/21. She is tolerating treatment very well.   2. Cancer of ascending colon, pT4bN1bM0, stage IIIC, MSI-stable, (+) surgical margins at pelvic wall     -She was diagnosed in 12/2015. She is s/p right hemicolectomy with right salpingo oophorectomy and adjuvant ChemoRT. -she declined adjuvant chemo due to the concern of side effects and impact on her quality of life. -Her 04/2019 CT scan was NED  -With recent rectal cancer diagnosis, her PET from 07/12/20 indicates soft tissue mass within the right iliac fossa is noted and consistent with local tumor recurrence from previous ascending colon tumor.   3. Mild Anemia -Has been stable and mild for the past year.  -worse lately due to rectal cancer and starting chemo -denies bleeding -monitoring, stable in 9-range.   4. HTN, Anxiety -She'll follow-up with her primary care physician and continue medication -She uses half tab Xanax as needed, mostly only on treatment days. -stable     PLAN: -proceed with irinotecan/beva today at same dose              -[name] injection on day 3  -lab, flush, f/u, and next irinotecan/beva on 3/2 as scheduled and in 4 and 6 weeks   No problem-specific Assessment & Plan notes found for this encounter.   SUMMARY OF ONCOLOGIC HISTORY: Oncology History Overview Note  Cancer Staging Cancer of ascending colon [facility]) Staging form: Colon and Rectum, AJCC 7th Edition - Clinical stage from 02/07/2016: Stage IIIC (T4b, N1b, M0) - Signed by [name], MD on 03/04/2016 Laterality: Right Residual tumor (R): R2 - Macroscopic    Cancer of ascending colon ([place])  10/25/2015 Imaging  CT ABD/PELVIS:  Inflammatory changes inferior to the cecal tip appear improved, there is still irregular soft tissue thickening of the cecal tip, and there are adjacent prominent lymph nodes in the ileocolonic mesentery, measuring 13 mm on  image 49 and 8 mm on image 52. In addition, there is a 2.5 x 1.8 cm nodule on image 46 which has central low density. Therefore, these findings are moderately suspicious for an underlying cecal malignancy with perforation.    01/19/2016 Procedure   COLONOSCOPY per Dr. [name]: Fungating, ulcerated mass almost obstructing mid ascending colon   01/19/2016 Initial Biopsy   Diagnosis Surgical [P], cecal mass - INVASIVE ADENOCARCINOMA WITH ULCERATION. - SEE COMMENT.   02/05/2016 Tumor Marker   Patient's tumor was tested for the following markers: CEA Results of the tumor marker test revealed 5.7.   02/07/2016 Initial Diagnosis   Cancer of ascending colon ([place])   02/07/2016 Definitive Surgery   Laparoscopic assisted right hemicolectomy and right salpingo oopherectomy--Dr. [name]   02/07/2016 Pathologic Stage   p T4 N1b   2/43 nodes +   02/07/2016 Pathology Results   MMR normal; G2 adenocarcinoma;proximal & distal margins negative; soft tissue mass on pelvic sidewall + for adenocarcinoma with positive margin MSI Stable   03/08/2016 Imaging   CT chest negative for metastasis.    03/19/2016 - 04/25/2016 Radiation Therapy   Adjuvant irradiation, 50 gray in 28 fractions   03/19/2016 - 04/22/2016 Chemotherapy   Xeloda 1500 mg twice daily, started on 03/19/2016, dose reduced to 1000 mg twice daily from week 3 due to neutropenia, and patient stopped 3 days before last dose radiation due to difficulty swallowing the pill    05/20/2016 -  Adjuvant Chemotherapy   Patient declined adjuvant chemotherapy   09/16/2016 Imaging   CT CAP w Contrast 1. No evidence of local tumor recurrence at the ileocolic anastomosis. 2. No findings suspicious for metastatic disease in the chest, abdomen or pelvis. 3. Nonspecific trace free fluid in the pelvic cul-de-sac. 4. Stable solitary 3 mm right upper lobe pulmonary nodule, for which 6 month stability has been demonstrated, probably benign. 5. Additional findings  include stable right posterior pericardial cyst and small calcified uterine fibroids.   05/13/2017 Imaging   CT CAP W Contrast 05/13/17 IMPRESSION: 1. No current findings of residual or recurrent malignancy. 2. Mild prominence of stool throughout the colon. Nondistended portions of the rectum. 3. Several tiny pulmonary nodules are stable from the earliest available comparison of 03/08/2016 and probably benign, but may merit surveillance. 4. Other imaging findings of potential clinical significance: Old granulomatous disease. Aortoiliac atherosclerotic vascular disease. Lumbar spondylosis and degenerative disc disease. Stable amount of trace free pelvic fluid.   04/27/2018 Imaging   04/27/2018 CT CAP IMPRESSION: Stable exam. No evidence of recurrent or metastatic carcinoma within the chest, abdomen, or pelvis   04/19/2019 Imaging   CT CAP W Contrast  IMPRESSION: Chest Impression:   1. No evidence of thoracic metastasis. 2. Stable small bilateral pulmonary nodules.   Abdomen / Pelvis Impression:   1. No evidence local colorectal carcinoma recurrence or metastasis in the abdomen pelvis. 2. Post RIGHT hemicolectomy.   01/22/2021 Imaging   CT CAP  IMPRESSION: CT CHEST IMPRESSION   1. Similar nonspecific pulmonary nodules. 2. New posterior left upper lobe reticulonodular opacity, suspicious for interval mild infection or inflammation. 3. No thoracic adenopathy.   CT ABDOMEN AND PELVIS IMPRESSION   1. Further decrease in size of high left hepatic lobe 3 mm low-density lesion.  No new or progressive metastatic disease within the abdomen or pelvis. 2. Similar trace free pelvic fluid. 3. Similar nonspecific mid rectal wall thickening. 4.  Aortic Atherosclerosis (ICD10-I70.0).   04/23/2021 Imaging   CT CAP  IMPRESSION: 1. Treated metastatic lesion between segments 2 and 3 of the liver, slightly smaller and less distinct than prior examination. No other signs of  definite metastatic disease elsewhere in the abdomen or pelvis. 2. Multiple small pulmonary nodules, stable compared to the prior examination, favored to be benign. No definitive findings to suggest metastatic disease to the thorax. 3. Aortic atherosclerosis. 4. Additional incidental findings, as above.   08/13/2021 Imaging   EXAM: CT CHEST, ABDOMEN, AND PELVIS WITH CONTRAST  IMPRESSION: 1. A previously seen PET avid lesion of the anterior left lobe of the liver, hepatic segment II, is no longer discretely appreciable consistent with treatment response of a hepatic metastasis. 2. No evidence of new metastatic disease in the chest, abdomen, or pelvis. 3. Interval increase in a small focus of consolidation and nodularity of the medial left upper lobe, consistent with minimal, ongoing atypical infection. Additional tiny bilateral pulmonary nodules are stable and almost certainly incidental benign. Attention on follow-up. 4. Status post right hemicolectomy and ileocolic anastomosis.   Rectal cancer metastasized to liver ([place])  06/15/2020 Procedure   Screening Colonoscopy by Dr [name]  IMPRESSION - Decreased sphincter tone and internal hemorrhoids that prolapse with straining, but require manual replacement into the anal canal (Grade III) found on digital rectal exam. - Patent side-to-side ileo-colonic anastomosis, characterized by healthy appearing mucosa. - The examined portion of the ileum was normal. - One diminutive polyp in the proximal transverse colon, removed with a cold biopsy forceps. Resected and retrieved. - Likely malignant partially obstructing tumor in the mid rectum. Biopsied. Tattooed. - The examination was otherwise normal on direct and retroflexion views.   06/15/2020 Initial Biopsy   Diagnosis 1. Transverse Colon Polyp - HYPERPLASTIC POLYP 2. Rectum, biopsy - ADENOCARCINOMA ARISING IN A TUBULAR ADENOMA WITH HIGH-GRADE DYSPLASIA. SEE NOTE Diagnosis Note 2. Dr.  [name] reviewed the case and concurs with the diagnosis. Dr. [name] was notified on 06/16/2020.   06/28/2020 Imaging   CT CAP  IMPRESSION: 1. New low-density focus in the anterior aspect of the lateral segment LEFT hepatic lobe measuring 1.2 x 1.0 cm, compatible with small metastatic lesion in the LEFT hepatic lobe. 2. Soft tissue in the RIGHT iliac fossa following RIGHT hemicolectomy invades the psoas musculature and is slowly enlarging over time, more linear on the prior study now highly concerning for recurrence/metastasis to this location. 3. Signs of enteritis, potentially post radiation changes of the small bowel. Tethered small bowel in the RIGHT lower quadrant shows focal thickening and narrowing suspicious for small bowel involvement and developing partial obstruction though currently contrast passes beyond this point into the colon. 4. Rectal thickening in this patient with known rectal mass as described. 5. No evidence of metastatic disease in the chest. 6. Stable small pulmonary nodules. 7.  and aortic atherosclerosis.   Aortic Atherosclerosis (ICD10-I70.0) and Emphysema (ICD10-J43.9).   07/04/2020 Initial Diagnosis   Rectal cancer metastasized to liver ([place])   07/12/2020 PET scan   IMPRESSION: 1. Exam positive for FDG avid rectal tumor which corresponds to the recent colonoscopy findings. 2. FDG avid soft tissue mass within the right iliac fossa is noted and consistent with local tumor recurrence from previous ascending colon tumor. 3. Lateral segment left lobe of liver lesion is FDG avid concerning for  liver metastasis. 4. No specific findings identified to suggest metastatic disease to the chest.   08/10/2020 -  Chemotherapy   First-line FOLFIRI q2weeks starting 08/10/20. dose reduced with cycle 1. Irinotecan/5FU increased and Bevacizumab added with cycle 2 on 08/23/2020    10/27/2020 Imaging   CT CAP  IMPRESSION: 1. Interval decrease in size of the  hypermetabolic left hepatic lesion, consistent with metastatic disease. No new liver lesion evident. 2. Interval resolution of the hypermetabolic soft tissue lesion along the right iliac fossa with no measurable soft tissue lesion remaining at this location today. 3. Similar appearance of soft tissue fullness in the rectum at the site of the hypermetabolic lesion seen previously. 4. Stable tiny bilateral pulmonary nodules. Continued attention on follow-up recommended. 5. Small volume free fluid in the pelvis. 6. Aortic Atherosclerosis (ICD10-I70.0).   01/22/2021 Imaging   CT CAP  IMPRESSION: CT CHEST IMPRESSION   1. Similar nonspecific pulmonary nodules. 2. New posterior left upper lobe reticulonodular opacity, suspicious for interval mild infection or inflammation. 3. No thoracic adenopathy.   CT ABDOMEN AND PELVIS IMPRESSION   1. Further decrease in size of high left hepatic lobe 3 mm low-density lesion. No new or progressive metastatic disease within the abdomen or pelvis. 2. Similar trace free pelvic fluid. 3. Similar nonspecific mid rectal wall thickening. 4.  Aortic Atherosclerosis (ICD10-I70.0).   04/23/2021 Imaging   CT CAP  IMPRESSION: 1. Treated metastatic lesion between segments 2 and 3 of the liver, slightly smaller and less distinct than prior examination. No other signs of definite metastatic disease elsewhere in the abdomen or pelvis. 2. Multiple small pulmonary nodules, stable compared to the prior examination, favored to be benign. No definitive findings to suggest metastatic disease to the thorax. 3. Aortic atherosclerosis. 4. Additional incidental findings, as above.   08/13/2021 Imaging   EXAM: CT CHEST, ABDOMEN, AND PELVIS WITH CONTRAST  IMPRESSION: 1. A previously seen PET avid lesion of the anterior left lobe of the liver, hepatic segment II, is no longer discretely appreciable consistent with treatment response of a hepatic metastasis. 2. No  evidence of new metastatic disease in the chest, abdomen, or pelvis. 3. Interval increase in a small focus of consolidation and nodularity of the medial left upper lobe, consistent with minimal, ongoing atypical infection. Additional tiny bilateral pulmonary nodules are stable and almost certainly incidental benign. Attention on follow-up. 4. Status post right hemicolectomy and ileocolic anastomosis.      INTERVAL HISTORY:  Brittney Spencer is here for a follow up of metastatic rectal cancer. She was last seen by me on 08/02/21. She presents to the clinic alone. She reports she is keeping herself busy, including walking more.   All other systems were reviewed with the patient and are negative.  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 Some residual intraretinal fluid remains, very small  localized subfoveal of fluid remains although this continues to slowly resorb. We'll continue to observe.   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  ALLERGIES:  is allergic to fish allergy, peanut-containing drug products, soy allergy, and buspirone.  MEDICATIONS:  Current Outpatient Medications  Medication Sig Dispense Refill   ALPRAZolam (XANAX) 0.25 MG tablet Take 1 tablet (0.25 mg total) by mouth daily as needed for anxiety. 30 tablet 0   Cholecalciferol (VITAMIN D3 PO) Take by mouth daily.     diphenoxylate-atropine (LOMOTIL) 2.5-0.025 MG tablet Take 2 tablets by mouth 4 (four) times daily as needed for diarrhea or loose stools. 45 tablet 3   docusate sodium (COLACE) 100 MG capsule 1 capsule as needed     lidocaine-prilocaine (EMLA) cream Apply 1 application topically as needed. 30 g 1   NORVASC 2.5 MG tablet TAKE 1 TABLET BY MOUTH EVERY DAY 90 tablet 1   ondansetron (ZOFRAN) 8 MG tablet Take 1 tablet (8 mg total) by mouth every 8 (eight) hours as needed for nausea or vomiting. 20 tablet 2   Current Facility-Administered Medications  Medication Dose Route Frequency Provider Last Rate Last Admin   0.9 %  sodium chloride infusion  500 mL Intravenous Once Doran Stabler, MD       Facility-Administered Medications Ordered in Other Visits  Medication Dose Route Frequency Provider Last Rate Last Admin   sodium chloride flush (NS) 0.9 % injection 10 mL  10 mL Intracatheter PRN [name], MD   10 mL at 08/16/21 1601    PHYSICAL EXAMINATION: ECOG PERFORMANCE STATUS: 1 - Symptomatic but completely ambulatory  Vitals:   08/16/21 1229  BP: 135/75  Pulse: 86  Resp: 18  Temp: 98.6 F (37 C)  SpO2: 100%   Wt Readings from Last 3 Encounters:  08/16/21 110 lb 12.8 oz (50.3 kg)  08/02/21 111 lb 9.6 oz (50.6 kg)  07/19/21 111 lb 3.2 oz (50.4 kg)     GENERAL:alert, no distress and comfortable SKIN: skin color  normal, no rashes or significant lesions EYES: normal, Conjunctiva are pink and non-injected, sclera clear  NEURO: alert & oriented x 3 with fluent speech  LABORATORY DATA:  I have reviewed the data as listed CBC Latest Ref Rng & Units 08/16/2021 08/02/2021 07/19/2021  WBC 4.0 - 10.5 K/uL 6.3 6.4 6.2  Hemoglobin 12.0 - 15.0 g/dL 9.5(L) 9.6(L) 9.6(L)  Hematocrit 36.0 - 46.0 % 30.8(L) 30.3(L) 31.1(L)  Platelets 150 - 400 K/uL 190 205 165     CMP Latest Ref Rng & Units 08/16/2021 08/02/2021 07/19/2021  Glucose 70 - 99 mg/dL 80 83 84  BUN 8 - 23 mg/dL '14 13 17  ' Creatinine 0.44 - 1.00 mg/dL 0.71 0.73 0.78  Sodium 135 - 145 mmol/L 138 138 137  Potassium 3.5 - 5.1 mmol/L 3.9 4.0 4.1  Chloride 98 -  111 mmol/L 105 106 105  CO2 22 - 32 mmol/L '29 28 28  ' Calcium 8.9 - 10.3 mg/dL 8.8(L) 8.7(L) 9.0  Total Protein 6.5 - 8.1 g/dL 6.6 6.8 6.6  Total Bilirubin 0.3 - 1.2 mg/dL 0.3 0.3 0.4  Alkaline Phos 38 - 126 U/L 82 88 72  AST 15 - 41 U/L 14(L) 13(L) 14(L)  ALT 0 - 44 U/L '8 7 8      ' RADIOGRAPHIC STUDIES: I have personally reviewed the radiological images as listed and agreed with the findings in the report. No results found.    No orders of the defined types were placed in this encounter.  All questions were answered. The patient knows to call the clinic with any problems, questions or concerns. No barriers to learning was detected. The total time spent in the appointment was 30 minutes.     [name], MD 08/16/2021   I, Wilburn Mylar, am acting as scribe for [name], MD.   I have reviewed the above documentation for accuracy and completeness, and I agree with the above.

## 2021-08-18 ENCOUNTER — Other Ambulatory Visit: Payer: Self-pay

## 2021-08-18 ENCOUNTER — Inpatient Hospital Stay: Payer: Federal, State, Local not specified - PPO

## 2021-08-18 ENCOUNTER — Encounter: Payer: Self-pay | Admitting: Hematology

## 2021-08-18 VITALS — BP 139/74 | HR 85 | Temp 97.6°F | Resp 16

## 2021-08-18 DIAGNOSIS — C20 Malignant neoplasm of rectum: Secondary | ICD-10-CM

## 2021-08-18 DIAGNOSIS — C787 Secondary malignant neoplasm of liver and intrahepatic bile duct: Secondary | ICD-10-CM

## 2021-08-18 DIAGNOSIS — Z5112 Encounter for antineoplastic immunotherapy: Secondary | ICD-10-CM | POA: Diagnosis not present

## 2021-08-18 MED ORDER — PEGFILGRASTIM-CBQV 6 MG/0.6ML ~~LOC~~ SOSY
6.0000 mg | PREFILLED_SYRINGE | Freq: Once | SUBCUTANEOUS | Status: AC
  Administered 2021-08-18: 6 mg via SUBCUTANEOUS
  Filled 2021-08-18: qty 0.6

## 2021-08-18 NOTE — Patient Instructions (Signed)

## 2021-08-20 ENCOUNTER — Telehealth: Payer: Self-pay | Admitting: Hematology

## 2021-08-20 NOTE — Telephone Encounter (Signed)
Left message with follow-up appointments per 2/16 los.

## 2021-08-29 MED FILL — Dexamethasone Sodium Phosphate Inj 100 MG/10ML: INTRAMUSCULAR | Qty: 1 | Status: AC

## 2021-08-29 NOTE — Progress Notes (Signed)
[name]   [phone] Fax:([phone]) [phone]   Clinic Follow up Note   Patient Care Team: Patient, No Pcp Per (Inactive) as PCP - General (General Practice) Brittney Feeling, NP as Nurse Practitioner (Oncology) Brittney Merle, MD as Consulting Physician (Oncology) [name], RN (Inactive) as Oncology Nurse Navigator 08/30/2021  CHIEF COMPLAINT: Follow up metastatic rectal cancer, h/o colon cancer   SUMMARY OF ONCOLOGIC HISTORY: Oncology History Overview Note  Cancer Staging Cancer of ascending colon [facility]) Staging form: Colon and Rectum, AJCC 7th Edition - Clinical stage from 02/07/2016: Stage IIIC (T4b, N1b, M0) - Signed by Brittney Merle, MD on 03/04/2016 Laterality: Right Residual tumor (R): R2 - Macroscopic    Cancer of ascending colon ([place])  10/25/2015 Imaging   CT ABD/PELVIS:  Inflammatory changes inferior to the cecal tip appear improved, there is still irregular soft tissue thickening of the cecal tip, and there are adjacent prominent lymph nodes in the ileocolonic mesentery, measuring 13 mm on image 49 and 8 mm on image 52. In addition, there is a 2.5 x 1.8 cm nodule on image 46 which has central low density. Therefore, these findings are moderately suspicious for an underlying cecal malignancy with perforation.    01/19/2016 Procedure   COLONOSCOPY per Dr. [name]: Fungating, ulcerated mass almost obstructing mid ascending colon   01/19/2016 Initial Biopsy   Diagnosis Surgical [P], cecal mass - INVASIVE ADENOCARCINOMA WITH ULCERATION. - SEE COMMENT.   02/05/2016 Tumor Marker   Patient's tumor was tested for the following markers: CEA Results of the tumor marker test revealed 5.7.   02/07/2016 Initial Diagnosis   Cancer of ascending colon ([place])   02/07/2016 Definitive Surgery   Laparoscopic assisted right hemicolectomy and right salpingo oopherectomy--Dr. [name]   02/07/2016 Pathologic Stage   p T4 N1b   2/43 nodes +   02/07/2016 Pathology  Results   MMR normal; G2 adenocarcinoma;proximal & distal margins negative; soft tissue mass on pelvic sidewall + for adenocarcinoma with positive margin MSI Stable   03/08/2016 Imaging   CT chest negative for metastasis.    03/19/2016 - 04/25/2016 Radiation Therapy   Adjuvant irradiation, 50 gray in 28 fractions   03/19/2016 - 04/22/2016 Chemotherapy   Xeloda 1500 mg twice daily, started on 03/19/2016, dose reduced to 1000 mg twice daily from week 3 due to neutropenia, and patient stopped 3 days before last dose radiation due to difficulty swallowing the pill    05/20/2016 -  Adjuvant Chemotherapy   Patient declined adjuvant chemotherapy   09/16/2016 Imaging   CT CAP w Contrast 1. No evidence of local tumor recurrence at the ileocolic anastomosis. 2. No findings suspicious for metastatic disease in the chest, abdomen or pelvis. 3. Nonspecific trace free fluid in the pelvic cul-de-sac. 4. Stable solitary 3 mm right upper lobe pulmonary nodule, for which 6 month stability has been demonstrated, probably benign. 5. Additional findings include stable right posterior pericardial cyst and small calcified uterine fibroids.   05/13/2017 Imaging   CT CAP W Contrast 05/13/17 IMPRESSION: 1. No current findings of residual or recurrent malignancy. 2. Mild prominence of stool throughout the colon. Nondistended portions of the rectum. 3. Several tiny pulmonary nodules are stable from the earliest available comparison of 03/08/2016 and probably benign, but may merit surveillance. 4. Other imaging findings of potential clinical significance: Old granulomatous disease. Aortoiliac atherosclerotic vascular disease. Lumbar spondylosis and degenerative disc disease. Stable amount of trace free pelvic fluid.   04/27/2018 Imaging   04/27/2018 CT  CAP IMPRESSION: Stable exam. No evidence of recurrent or metastatic carcinoma within the chest, abdomen, or pelvis   04/19/2019 Imaging   CT CAP W  Contrast  IMPRESSION: Chest Impression:   1. No evidence of thoracic metastasis. 2. Stable small bilateral pulmonary nodules.   Abdomen / Pelvis Impression:   1. No evidence local colorectal carcinoma recurrence or metastasis in the abdomen pelvis. 2. Post RIGHT hemicolectomy.   01/22/2021 Imaging   CT CAP  IMPRESSION: CT CHEST IMPRESSION   1. Similar nonspecific pulmonary nodules. 2. New posterior left upper lobe reticulonodular opacity, suspicious for interval mild infection or inflammation. 3. No thoracic adenopathy.   CT ABDOMEN AND PELVIS IMPRESSION   1. Further decrease in size of high left hepatic lobe 3 mm low-density lesion. No new or progressive metastatic disease within the abdomen or pelvis. 2. Similar trace free pelvic fluid. 3. Similar nonspecific mid rectal wall thickening. 4.  Aortic Atherosclerosis (ICD10-I70.0).   04/23/2021 Imaging   CT CAP  IMPRESSION: 1. Treated metastatic lesion between segments 2 and 3 of the liver, slightly smaller and less distinct than prior examination. No other signs of definite metastatic disease elsewhere in the abdomen or pelvis. 2. Multiple small pulmonary nodules, stable compared to the prior examination, favored to be benign. No definitive findings to suggest metastatic disease to the thorax. 3. Aortic atherosclerosis. 4. Additional incidental findings, as above.   08/13/2021 Imaging   EXAM: CT CHEST, ABDOMEN, AND PELVIS WITH CONTRAST  IMPRESSION: 1. A previously seen PET avid lesion of the anterior left lobe of the liver, hepatic segment II, is no longer discretely appreciable consistent with treatment response of a hepatic metastasis. 2. No evidence of new metastatic disease in the chest, abdomen, or pelvis. 3. Interval increase in a small focus of consolidation and nodularity of the medial left upper lobe, consistent with minimal, ongoing atypical infection. Additional tiny bilateral pulmonary nodules are  stable and almost certainly incidental benign. Attention on follow-up. 4. Status post right hemicolectomy and ileocolic anastomosis.   Rectal cancer metastasized to liver ([place])  06/15/2020 Procedure   Screening Colonoscopy by Dr [name]  IMPRESSION - Decreased sphincter tone and internal hemorrhoids that prolapse with straining, but require manual replacement into the anal canal (Grade III) found on digital rectal exam. - Patent side-to-side ileo-colonic anastomosis, characterized by healthy appearing mucosa. - The examined portion of the ileum was normal. - One diminutive polyp in the proximal transverse colon, removed with a cold biopsy forceps. Resected and retrieved. - Likely malignant partially obstructing tumor in the mid rectum. Biopsied. Tattooed. - The examination was otherwise normal on direct and retroflexion views.   06/15/2020 Initial Biopsy   Diagnosis 1. Transverse Colon Polyp - HYPERPLASTIC POLYP 2. Rectum, biopsy - ADENOCARCINOMA ARISING IN A TUBULAR ADENOMA WITH HIGH-GRADE DYSPLASIA. SEE NOTE Diagnosis Note 2. Dr. [name] reviewed the case and concurs with the diagnosis. Dr. [name] was notified on 06/16/2020.   06/28/2020 Imaging   CT CAP  IMPRESSION: 1. New low-density focus in the anterior aspect of the lateral segment LEFT hepatic lobe measuring 1.2 x 1.0 cm, compatible with small metastatic lesion in the LEFT hepatic lobe. 2. Soft tissue in the RIGHT iliac fossa following RIGHT hemicolectomy invades the psoas musculature and is slowly enlarging over time, more linear on the prior study now highly concerning for recurrence/metastasis to this location. 3. Signs of enteritis, potentially post radiation changes of the small bowel. Tethered small bowel in the RIGHT lower quadrant shows focal thickening and  narrowing suspicious for small bowel involvement and developing partial obstruction though currently contrast passes beyond this point into the colon. 4.  Rectal thickening in this patient with known rectal mass as described. 5. No evidence of metastatic disease in the chest. 6. Stable small pulmonary nodules. 7.  and aortic atherosclerosis.   Aortic Atherosclerosis (ICD10-I70.0) and Emphysema (ICD10-J43.9).   07/04/2020 Initial Diagnosis   Rectal cancer metastasized to liver ([place])   07/12/2020 PET scan   IMPRESSION: 1. Exam positive for FDG avid rectal tumor which corresponds to the recent colonoscopy findings. 2. FDG avid soft tissue mass within the right iliac fossa is noted and consistent with local tumor recurrence from previous ascending colon tumor. 3. Lateral segment left lobe of liver lesion is FDG avid concerning for liver metastasis. 4. No specific findings identified to suggest metastatic disease to the chest.   08/10/2020 -  Chemotherapy   First-line FOLFIRI q2weeks starting 08/10/20. dose reduced with cycle 1. Irinotecan/5FU increased and Bevacizumab added with cycle 2 on 08/23/2020    10/27/2020 Imaging   CT CAP  IMPRESSION: 1. Interval decrease in size of the hypermetabolic left hepatic lesion, consistent with metastatic disease. No new liver lesion evident. 2. Interval resolution of the hypermetabolic soft tissue lesion along the right iliac fossa with no measurable soft tissue lesion remaining at this location today. 3. Similar appearance of soft tissue fullness in the rectum at the site of the hypermetabolic lesion seen previously. 4. Stable tiny bilateral pulmonary nodules. Continued attention on follow-up recommended. 5. Small volume free fluid in the pelvis. 6. Aortic Atherosclerosis (ICD10-I70.0).   01/22/2021 Imaging   CT CAP  IMPRESSION: CT CHEST IMPRESSION   1. Similar nonspecific pulmonary nodules. 2. New posterior left upper lobe reticulonodular opacity, suspicious for interval mild infection or inflammation. 3. No thoracic adenopathy.   CT ABDOMEN AND PELVIS IMPRESSION   1. Further decrease  in size of high left hepatic lobe 3 mm low-density lesion. No new or progressive metastatic disease within the abdomen or pelvis. 2. Similar trace free pelvic fluid. 3. Similar nonspecific mid rectal wall thickening. 4.  Aortic Atherosclerosis (ICD10-I70.0).   04/23/2021 Imaging   CT CAP  IMPRESSION: 1. Treated metastatic lesion between segments 2 and 3 of the liver, slightly smaller and less distinct than prior examination. No other signs of definite metastatic disease elsewhere in the abdomen or pelvis. 2. Multiple small pulmonary nodules, stable compared to the prior examination, favored to be benign. No definitive findings to suggest metastatic disease to the thorax. 3. Aortic atherosclerosis. 4. Additional incidental findings, as above.   08/13/2021 Imaging   EXAM: CT CHEST, ABDOMEN, AND PELVIS WITH CONTRAST  IMPRESSION: 1. A previously seen PET avid lesion of the anterior left lobe of the liver, hepatic segment II, is no longer discretely appreciable consistent with treatment response of a hepatic metastasis. 2. No evidence of new metastatic disease in the chest, abdomen, or pelvis. 3. Interval increase in a small focus of consolidation and nodularity of the medial left upper lobe, consistent with minimal, ongoing atypical infection. Additional tiny bilateral pulmonary nodules are stable and almost certainly incidental benign. Attention on follow-up. 4. Status post right hemicolectomy and ileocolic anastomosis.     CURRENT THERAPY: Maintenance irinotecan and bevacizumab, q2 weeks, from 01/24/2021  INTERVAL HISTORY: Ms. Rhee returns for follow up and treatment as schedule.d last seen by Dr. Burr Medico 08/16/21 and completed another cycle of irinotecan/beva and GCSF.  She has mild nausea for 2 days after treatment, manages  with alternative diet.  No vomiting.  Bowels moving well with occasional constipation, manages with MiraLAX.  She continues to work hard on her nutrition and  weight.  She walks briskly.  Notes that she "stays a little cold," without fever or chills.  She is very pleased with how she has adjusted to treatment overall.  Denies new or worsening pain, neuropathy, cough, chest pain, dyspnea, leg edema, or any other new complaints.   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 Some residual intraretinal fluid remains, very small localized subfoveal of fluid remains although this continues to slowly resorb. We'll continue to observe.   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  ALLERGIES:  is allergic to fish allergy, peanut-containing drug products, soy allergy, and buspirone.  MEDICATIONS:  Current Outpatient Medications  Medication Sig Dispense Refill   ALPRAZolam (XANAX) 0.25 MG tablet  Take 1 tablet (0.25 mg total) by mouth daily as needed for anxiety. 30 tablet 0   Cholecalciferol (VITAMIN D3 PO) Take by mouth daily.     diphenoxylate-atropine (LOMOTIL) 2.5-0.025 MG tablet Take 2 tablets by mouth 4 (four) times daily as needed for diarrhea or loose stools. 45 tablet 3   docusate sodium (COLACE) 100 MG capsule 1 capsule as needed     lidocaine-prilocaine (EMLA) cream Apply 1 application topically as needed. 30 g 1   NORVASC 2.5 MG tablet TAKE 1 TABLET BY MOUTH EVERY DAY 90 tablet 1   ondansetron (ZOFRAN) 8 MG tablet Take 1 tablet (8 mg total) by mouth every 8 (eight) hours as needed for nausea or vomiting. 20 tablet 2   Current Facility-Administered Medications  Medication Dose Route Frequency Provider Last Rate Last Admin   0.9 %  sodium chloride infusion  500 mL Intravenous Once Nelida Meuse III, MD        PHYSICAL EXAMINATION: ECOG PERFORMANCE STATUS: 1 - Symptomatic but completely ambulatory  Vitals:   08/30/21 0942  BP: 133/79  Pulse: 82  Temp: 97.9 F (36.6 C)  SpO2: 100%   Filed Weights   08/30/21 0942  Weight: 111 lb 12.8 oz (50.7 kg)    GENERAL:alert, no distress and comfortable SKIN: No rash EYES: sclera clear LUNGS: normal breathing  effort HEART:  no lower extremity edema NEURO: alert & oriented x 3 with fluent speech, no focal motor deficits PAC without erythema  LABORATORY DATA:  I have reviewed the data as listed CBC Latest Ref Rng & Units 08/30/2021 08/16/2021 08/02/2021  WBC 4.0 - 10.5 K/uL 6.0 6.3 6.4  Hemoglobin 12.0 - 15.0 g/dL 9.6(L) 9.5(L) 9.6(L)  Hematocrit 36.0 - 46.0 % 31.4(L) 30.8(L) 30.3(L)  Platelets 150 - 400 K/uL 213 190 205     CMP Latest Ref Rng & Units 08/16/2021 08/02/2021 07/19/2021  Glucose 70 - 99 mg/dL 80 83 84  BUN 8 - 23 mg/dL '14 13 17  ' Creatinine 0.44 - 1.00 mg/dL 0.71 0.73 0.78  Sodium 135 - 145 mmol/L 138 138 137  Potassium 3.5 - 5.1 mmol/L 3.9 4.0 4.1  Chloride 98 - 111 mmol/L 105 106 105  CO2 22 - 32 mmol/L  '29 28 28  ' Calcium 8.9 - 10.3 mg/dL 8.8(L) 8.7(L) 9.0  Total Protein 6.5 - 8.1 g/dL 6.6 6.8 6.6  Total Bilirubin 0.3 - 1.2 mg/dL 0.3 0.3 0.4  Alkaline Phos 38 - 126 U/L 82 88 72  AST 15 - 41 U/L 14(L) 13(L) 14(L)  ALT 0 - 44 U/L '8 7 8      ' RADIOGRAPHIC STUDIES: I have personally reviewed the radiological images as listed and agreed with the findings in the report. No results found.   ASSESSMENT & PLAN:  Brittney Spencer is a 71 y.o. female with   1. Rectal adenocarcinoma, with liver and right pelvic metastasis, recurrence from previous colon cancer vs new primary   -Diagnosed on routine screening colonoscopy 05/2020, rectal mass biopsy showed invasive adenocarcinoma, arising from a tubular adenoma -Her 07/12/20 PET showed positive uptake of known rectal tumor and soft tissue mass within the right iliac fossa indicating recurrent prior colon cancer. There is also left lobe of liver lesion is FDG avid concerning for liver metastasis. pt declined liver biopsy -whether this is metastatic rectal or recurrent/metastatic colon cancer, this is likely not curable, but still treatable. She previously declined referral for HIPEC -her FO is still pending, will see if she is eligible for EGFR inhibitor -She began first-line systemic chemo with dose-reduced FOLFIRI on 08/06/20. -Bevacizumab added, and irinotecan/5FU increased with cycle 2, but due to poor tolerance/diarrhea irinotecan was reduced with cycle 3 to 130 mg per metered squared, tolerating well -CT CAP 01/22/2021 showed continued good response to treatment, decreased size and liver metastasis, and indeterminate left upper lobe ground Glass lung nodule.  Tumor marker has normalized on treatment -She changed to maintenance therapy with irinotecan and bevacizumab every 2 weeks on 01/22/21 -Restaging CT CAP 04/23/2021 showed good partial response, continue current treatment -Restaging CT CAP 08/13/2021 showed a previously seen PET avid left lobe  liver lesion is no longer appreciable, consistent with treatment response, and no new metastatic disease in the chest abdomen or pelvis -She continues maintenance irinotecan and bevacizumab with G-CSF every 2 weeks, tolerating very well   2. Cancer of ascending colon, pT4bN1bM0, stage IIIC, MSI-stable, (+) surgical margins at pelvic wall     -She was diagnosed in 12/2015. She is s/p right hemicolectomy with right salpingo oophorectomy and  adjuvant ChemoRT. -she declined adjuvant chemo due to the concern of side effects and impact on her quality of life. -Her 04/2019 CT scan was NED  -With recent rectal cancer diagnosis, her PET from 07/12/20 indicates soft tissue mass within the right iliac fossa is noted and consistent with  local tumor recurrence from previous ascending colon tumor. -She declined option of liver biopsy.     3. Mild Anemia -Has been stable and mild for the past year. Worse with rectal cancer and starting chemo -denies bleeding -stable in 9 range; monitoring    4. HTN, Anxiety -She'll follow-up with her primary care physician and continue medication -She uses half tab Xanax as needed, mostly only on treatment days. refilled -stable, has adjusted well   Disposition: Ms. Bouchillon appears stable.  She continues irinotecan and bevacizumab with G-CSF every 2 weeks.  She continues to tolerate treatment very well with mild nausea and constipation.  Side effects are well managed with supportive care at home.  She is able to recover and function well.  No clinical evidence of disease progression.  Labs reviewed, CBC and CMP are stable, CEA is pending.  Adequate to proceed with irinotecan and bevacizumab today as planned, same dose.  She will return 3/4 for injection.  She plans to attend a play tomorrow, we reviewed infection precautions.  Follow-up in 2 weeks with next cycle.  She will have Easter week off in April.  All questions were answered. The patient knows to call the clinic  with any problems, questions or concerns. No barriers to learning were detected.     Brittney Feeling, NP 08/30/21

## 2021-08-30 ENCOUNTER — Encounter: Payer: Self-pay | Admitting: Hematology

## 2021-08-30 ENCOUNTER — Inpatient Hospital Stay: Payer: Federal, State, Local not specified - PPO

## 2021-08-30 ENCOUNTER — Inpatient Hospital Stay: Payer: Federal, State, Local not specified - PPO | Admitting: Nurse Practitioner

## 2021-08-30 ENCOUNTER — Encounter: Payer: Self-pay | Admitting: Nurse Practitioner

## 2021-08-30 ENCOUNTER — Other Ambulatory Visit: Payer: Self-pay

## 2021-08-30 ENCOUNTER — Inpatient Hospital Stay: Payer: Federal, State, Local not specified - PPO | Attending: Nurse Practitioner

## 2021-08-30 VITALS — Resp 17

## 2021-08-30 VITALS — BP 133/79 | HR 82 | Temp 97.9°F | Wt 111.8 lb

## 2021-08-30 DIAGNOSIS — I10 Essential (primary) hypertension: Secondary | ICD-10-CM | POA: Insufficient documentation

## 2021-08-30 DIAGNOSIS — Z923 Personal history of irradiation: Secondary | ICD-10-CM | POA: Insufficient documentation

## 2021-08-30 DIAGNOSIS — C20 Malignant neoplasm of rectum: Secondary | ICD-10-CM

## 2021-08-30 DIAGNOSIS — Z5189 Encounter for other specified aftercare: Secondary | ICD-10-CM | POA: Insufficient documentation

## 2021-08-30 DIAGNOSIS — D649 Anemia, unspecified: Secondary | ICD-10-CM | POA: Insufficient documentation

## 2021-08-30 DIAGNOSIS — Z5111 Encounter for antineoplastic chemotherapy: Secondary | ICD-10-CM | POA: Insufficient documentation

## 2021-08-30 DIAGNOSIS — C182 Malignant neoplasm of ascending colon: Secondary | ICD-10-CM | POA: Insufficient documentation

## 2021-08-30 DIAGNOSIS — F419 Anxiety disorder, unspecified: Secondary | ICD-10-CM | POA: Diagnosis not present

## 2021-08-30 DIAGNOSIS — Z79899 Other long term (current) drug therapy: Secondary | ICD-10-CM | POA: Insufficient documentation

## 2021-08-30 DIAGNOSIS — Z1509 Genetic susceptibility to other malignant neoplasm: Secondary | ICD-10-CM | POA: Diagnosis not present

## 2021-08-30 DIAGNOSIS — Z5112 Encounter for antineoplastic immunotherapy: Secondary | ICD-10-CM | POA: Diagnosis not present

## 2021-08-30 DIAGNOSIS — C787 Secondary malignant neoplasm of liver and intrahepatic bile duct: Secondary | ICD-10-CM

## 2021-08-30 DIAGNOSIS — Z9221 Personal history of antineoplastic chemotherapy: Secondary | ICD-10-CM | POA: Diagnosis not present

## 2021-08-30 DIAGNOSIS — Z95828 Presence of other vascular implants and grafts: Secondary | ICD-10-CM

## 2021-08-30 LAB — TOTAL PROTEIN, URINE DIPSTICK: Protein, ur: NEGATIVE mg/dL

## 2021-08-30 LAB — CBC WITH DIFFERENTIAL (CANCER CENTER ONLY)
Abs Immature Granulocytes: 0.02 10*3/uL (ref 0.00–0.07)
Basophils Absolute: 0 10*3/uL (ref 0.0–0.1)
Basophils Relative: 0 %
Eosinophils Absolute: 0.1 10*3/uL (ref 0.0–0.5)
Eosinophils Relative: 2 %
HCT: 31.4 % — ABNORMAL LOW (ref 36.0–46.0)
Hemoglobin: 9.6 g/dL — ABNORMAL LOW (ref 12.0–15.0)
Immature Granulocytes: 0 %
Lymphocytes Relative: 17 %
Lymphs Abs: 1 10*3/uL (ref 0.7–4.0)
MCH: 25.9 pg — ABNORMAL LOW (ref 26.0–34.0)
MCHC: 30.6 g/dL (ref 30.0–36.0)
MCV: 84.6 fL (ref 80.0–100.0)
Monocytes Absolute: 0.6 10*3/uL (ref 0.1–1.0)
Monocytes Relative: 9 %
Neutro Abs: 4.2 10*3/uL (ref 1.7–7.7)
Neutrophils Relative %: 72 %
Platelet Count: 213 10*3/uL (ref 150–400)
RBC: 3.71 MIL/uL — ABNORMAL LOW (ref 3.87–5.11)
RDW: 15.6 % — ABNORMAL HIGH (ref 11.5–15.5)
WBC Count: 6 10*3/uL (ref 4.0–10.5)
nRBC: 0 % (ref 0.0–0.2)

## 2021-08-30 LAB — CMP (CANCER CENTER ONLY)
ALT: 10 U/L (ref 0–44)
AST: 14 U/L — ABNORMAL LOW (ref 15–41)
Albumin: 3.8 g/dL (ref 3.5–5.0)
Alkaline Phosphatase: 87 U/L (ref 38–126)
Anion gap: 4 — ABNORMAL LOW (ref 5–15)
BUN: 15 mg/dL (ref 8–23)
CO2: 28 mmol/L (ref 22–32)
Calcium: 9 mg/dL (ref 8.9–10.3)
Chloride: 106 mmol/L (ref 98–111)
Creatinine: 0.72 mg/dL (ref 0.44–1.00)
GFR, Estimated: >60 mL/min (ref >60)
Glucose, Bld: 77 mg/dL (ref 70–99)
Potassium: 4 mmol/L (ref 3.5–5.1)
Sodium: 138 mmol/L (ref 135–145)
Total Bilirubin: 0.4 mg/dL (ref 0.3–1.2)
Total Protein: 6.8 g/dL (ref 6.5–8.1)

## 2021-08-30 LAB — CEA (IN HOUSE-CHCC): CEA (CHCC-In House): 4.42 ng/mL (ref 0.00–5.00)

## 2021-08-30 MED ORDER — SODIUM CHLORIDE 0.9 % IV SOLN
130.0000 mg/m2 | Freq: Once | INTRAVENOUS | Status: AC
  Administered 2021-08-30: 180 mg via INTRAVENOUS
  Filled 2021-08-30: qty 9

## 2021-08-30 MED ORDER — SODIUM CHLORIDE 0.9 % IV SOLN
5.0000 mg/kg | Freq: Once | INTRAVENOUS | Status: AC
  Administered 2021-08-30: 225 mg via INTRAVENOUS
  Filled 2021-08-30: qty 9

## 2021-08-30 MED ORDER — ATROPINE SULFATE 1 MG/ML IV SOLN
0.5000 mg | Freq: Once | INTRAVENOUS | Status: AC | PRN
  Administered 2021-08-30: 0.5 mg via INTRAVENOUS
  Filled 2021-08-30: qty 1

## 2021-08-30 MED ORDER — SODIUM CHLORIDE 0.9% FLUSH
10.0000 mL | INTRAVENOUS | Status: DC | PRN
  Administered 2021-08-30: 10 mL

## 2021-08-30 MED ORDER — SODIUM CHLORIDE 0.9 % IV SOLN: Freq: Once | INTRAVENOUS | Status: AC

## 2021-08-30 MED ORDER — SODIUM CHLORIDE 0.9 % IV SOLN
10.0000 mg | Freq: Once | INTRAVENOUS | Status: AC
  Administered 2021-08-30: 10 mg via INTRAVENOUS
  Filled 2021-08-30: qty 10

## 2021-08-30 MED ORDER — HEPARIN SOD (PORK) LOCK FLUSH 100 UNIT/ML IV SOLN
500.0000 [IU] | Freq: Once | INTRAVENOUS | Status: AC | PRN
  Administered 2021-08-30: 500 [IU]

## 2021-08-30 MED ORDER — PALONOSETRON HCL INJECTION 0.25 MG/5ML
0.2500 mg | Freq: Once | INTRAVENOUS | Status: AC
  Administered 2021-08-30: 0.25 mg via INTRAVENOUS
  Filled 2021-08-30: qty 5

## 2021-08-30 MED ORDER — SODIUM CHLORIDE 0.9% FLUSH
10.0000 mL | Freq: Once | INTRAVENOUS | Status: AC
  Administered 2021-08-30: 10 mL

## 2021-08-30 NOTE — Patient Instructions (Signed)
[facility]  Discharge Instructions: ?Thank you for choosing [facility] to provide your oncology and hematology care.  ? ?If you have a lab appointment with the [facility], please go directly to the [facility] and check in at the registration area. ?  ?Wear comfortable clothing and clothing appropriate for easy access to any Portacath or PICC line.  ? ?We strive to give you quality time with your provider. You may need to reschedule your appointment if you arrive late (15 or more minutes).  Arriving late affects you and other patients whose appointments are after yours.  Also, if you miss three or more appointments without notifying the office, you may be dismissed from the clinic at the provider?s discretion.    ?  ?For prescription refill requests, have your pharmacy contact our office and allow 72 hours for refills to be completed.   ? ?Today you received the following chemotherapy and/or immunotherapy agents: Bevacizumab and Irinotecan     ?  ?To help prevent nausea and vomiting after your treatment, we encourage you to take your nausea medication as directed. ? ?BELOW ARE SYMPTOMS THAT SHOULD BE REPORTED IMMEDIATELY: ?*FEVER GREATER THAN 100.4 F (38 ?C) OR HIGHER ?*CHILLS OR SWEATING ?*NAUSEA AND VOMITING THAT IS NOT CONTROLLED WITH YOUR NAUSEA MEDICATION ?*UNUSUAL SHORTNESS OF BREATH ?*UNUSUAL BRUISING OR BLEEDING ?*URINARY PROBLEMS (pain or burning when urinating, or frequent urination) ?*BOWEL PROBLEMS (unusual diarrhea, constipation, pain near the anus) ?TENDERNESS IN MOUTH AND THROAT WITH OR WITHOUT PRESENCE OF ULCERS (sore throat, sores in mouth, or a toothache) ?UNUSUAL RASH, SWELLING OR PAIN  ?UNUSUAL VAGINAL DISCHARGE OR ITCHING  ? ?Items with * indicate a potential emergency and should be followed up as soon as possible or go to the Emergency Department if any problems should occur. ? ?Please show the CHEMOTHERAPY ALERT CARD or IMMUNOTHERAPY ALERT  CARD at check-in to the Emergency Department and triage nurse. ? ?Should you have questions after your visit or need to cancel or reschedule your appointment, please contact [facility]  Dept: [phone]  and follow the prompts.  Office hours are 8:00 a.m. to 4:30 p.m. Monday - Friday. Please note that voicemails left after 4:00 p.m. may not be returned until the following business day.  We are closed weekends and major holidays. You have access to a nurse at all times for urgent questions. Please call the main number to the clinic Dept: [phone] and follow the prompts. ? ? ?For any non-urgent questions, you may also contact your provider using MyChart. We now offer e-Visits for anyone 75 and older to request care online for non-urgent symptoms. For details visit [email or web address]. ?  ?Also download the MyChart app! Go to the app store, search "MyChart", open the app, select [facility], and log in with your MyChart username and password. ? ?Due to Covid, a mask is required upon entering the hospital/clinic. If you do not have a mask, one will be given to you upon arrival. For doctor visits, patients may have 1 support person aged 8 or older with them. For treatment visits, patients cannot have anyone with them due to current Covid guidelines and our immunocompromised population.  ? ?

## 2021-08-31 ENCOUNTER — Telehealth: Payer: Self-pay | Admitting: Hematology

## 2021-08-31 NOTE — Telephone Encounter (Signed)
Scheduled follow-up appointments per 3/2 los. Patient is aware. ?

## 2021-09-01 ENCOUNTER — Inpatient Hospital Stay: Payer: Federal, State, Local not specified - PPO

## 2021-09-01 ENCOUNTER — Other Ambulatory Visit: Payer: Self-pay

## 2021-09-01 VITALS — BP 137/75 | HR 75 | Temp 97.8°F | Resp 16 | Ht 66.0 in

## 2021-09-01 DIAGNOSIS — C20 Malignant neoplasm of rectum: Secondary | ICD-10-CM

## 2021-09-01 DIAGNOSIS — Z5112 Encounter for antineoplastic immunotherapy: Secondary | ICD-10-CM | POA: Diagnosis not present

## 2021-09-01 DIAGNOSIS — C787 Secondary malignant neoplasm of liver and intrahepatic bile duct: Secondary | ICD-10-CM

## 2021-09-01 MED ORDER — PEGFILGRASTIM-CBQV 6 MG/0.6ML ~~LOC~~ SOSY
6.0000 mg | PREFILLED_SYRINGE | Freq: Once | SUBCUTANEOUS | Status: AC
  Administered 2021-09-01: 6 mg via SUBCUTANEOUS

## 2021-09-11 NOTE — Progress Notes (Signed)
?[facility]   ?[phone] [phone]   ?Clinic Follow up Note  ? ?Patient Care Team: ?Patient, No Pcp Per (Inactive) as PCP - General (General Practice) ?[name], NP as Nurse Practitioner (Oncology) ?[name], MD as Consulting Physician (Oncology) ?[name], RN (Inactive) as Oncology Nurse Navigator ?09/13/2021 ? ?CHIEF COMPLAINT: Follow-up metastatic rectal cancer, and history of colon cancer ? ?SUMMARY OF ONCOLOGIC HISTORY: ?Oncology History Overview Note  ?Cancer Staging ?Cancer of ascending colon ([place]) ?Staging form: Colon and Rectum, AJCC 7th Edition ?- Clinical stage from 02/07/2016: Stage IIIC (T4b, N1b, M0) - Signed by [name], MD on 03/04/2016 ?Laterality: Right ?Residual tumor (R): R2 - Macroscopic ? ?  ?Cancer of ascending colon ([place])  ?10/25/2015 Imaging  ? CT ABD/PELVIS:  Inflammatory changes inferior to the cecal ?tip appear improved, there is still irregular soft tissue thickening ?of the cecal tip, and there are adjacent prominent lymph nodes in ?the ileocolonic mesentery, measuring 13 mm on image 49 and 8 mm on ?image 52. In addition, there is a 2.5 x 1.8 cm nodule on image 46 ?which has central low density. Therefore, these findings are ?moderately suspicious for an underlying cecal malignancy with ?perforation.  ?  ?01/19/2016 Procedure  ? COLONOSCOPY per Dr. [name]: Fungating, ulcerated mass almost obstructing mid ascending colon ?  ?01/19/2016 Initial Biopsy  ? Diagnosis ?Surgical [P], cecal mass ?- INVASIVE ADENOCARCINOMA WITH ULCERATION. ?- SEE COMMENT. ?  ?02/05/2016 Tumor Marker  ? Patient's tumor was tested for the following markers: CEA ?Results of the tumor marker test revealed 5.7. ?  ?02/07/2016 Initial Diagnosis  ? Cancer of ascending colon [facility]) ?  ?02/07/2016 Definitive Surgery  ? Laparoscopic assisted right hemicolectomy and right salpingo oopherectomy--Dr. Hoxworth ?  ?02/07/2016 Pathologic Stage  ? p T4 N1b   ?2/43 nodes + ?  ?02/07/2016  Pathology Results  ? MMR normal; G2 adenocarcinoma;proximal & distal margins negative; soft tissue mass on pelvic sidewall + for adenocarcinoma with positive margin ?MSI Stable ?  ?03/08/2016 Imaging  ? CT chest negative for metastasis.  ?  ?03/19/2016 - 04/25/2016 Radiation Therapy  ? Adjuvant irradiation, 50 gray in 28 fractions ?  ?03/19/2016 - 04/22/2016 Chemotherapy  ? Xeloda 1500 mg twice daily, started on 03/19/2016, dose reduced to 1000 mg twice daily from week 3 due to neutropenia, and patient stopped 3 days before last dose radiation due to difficulty swallowing the pill ? ?  ?05/20/2016 -  Adjuvant Chemotherapy  ? Patient declined adjuvant chemotherapy ?  ?09/16/2016 Imaging  ? CT CAP w Contrast ?1. No evidence of local tumor recurrence at the ileocolic ?anastomosis. ?2. No findings suspicious for metastatic disease in the chest, ?abdomen or pelvis. ?3. Nonspecific trace free fluid in the pelvic cul-de-sac. ?4. Stable solitary 3 mm right upper lobe pulmonary nodule, for which ?6 month stability has been demonstrated, probably benign. ?5. Additional findings include stable right posterior pericardial ?cyst and small calcified uterine fibroids. ?  ?05/13/2017 Imaging  ? CT CAP W Contrast 05/13/17 ?IMPRESSION: ?1. No current findings of residual or recurrent malignancy. ?2. Mild prominence of stool throughout the colon. Nondistended ?portions of the rectum. ?3. Several tiny pulmonary nodules are stable from the earliest ?available comparison of 03/08/2016 and probably benign, but may ?merit surveillance. ?4. 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 ?Lumbar spondylosis and degenerative disc disease. Stable amount of ?trace free pelvic fluid. ?  ?04/27/2018 Imaging  ? 04/27/2018 CT  CAP ?IMPRESSION: ?Stable exam. No evidence of recurrent or metastatic carcinoma within ?the chest, abdomen, or pelvis ?  ?04/19/2019 Imaging  ? CT  CAP W Contrast  ?IMPRESSION: ?Chest Impression: ?  ?1. No evidence of thoracic metastasis. ?2. Stable small bilateral pulmonary nodules. ?  ?Abdomen / Pelvis Impression: ?  ?1. No evidence local colorectal carcinoma recurrence or metastasis ?in the abdomen pelvis. ?2. Post RIGHT hemicolectomy. ?  ?01/22/2021 Imaging  ? CT CAP ? ?IMPRESSION: ?CT CHEST IMPRESSION ?  ?1. Similar nonspecific pulmonary nodules. ?2. New posterior left upper lobe reticulonodular opacity, suspicious ?for interval mild infection or inflammation. ?3. No thoracic adenopathy. ?  ?CT ABDOMEN AND PELVIS IMPRESSION ?  ?1. Further decrease in size of high left hepatic lobe 3 mm ?low-density lesion. No new or progressive metastatic disease within the abdomen or pelvis. ?2. Similar trace free pelvic fluid. ?3. Similar nonspecific mid rectal wall thickening. ?4.  Aortic Atherosclerosis (ICD10-I70.0). ?  ?04/23/2021 Imaging  ? CT CAP ? ?IMPRESSION: ?1. Treated metastatic lesion between segments 2 and 3 of the liver, ?slightly smaller and less distinct than prior examination. No other ?signs of definite metastatic disease elsewhere in the abdomen or ?pelvis. ?2. Multiple small pulmonary nodules, stable compared to the prior ?examination, favored to be benign. No definitive findings to suggest ?metastatic disease to the thorax. ?3. Aortic atherosclerosis. ?4. Additional incidental findings, as above. ?  ?08/13/2021 Imaging  ? EXAM: ?CT CHEST, ABDOMEN, AND PELVIS WITH CONTRAST ? ?IMPRESSION: ?1. A previously seen PET avid lesion of the anterior left lobe of ?the liver, hepatic segment II, is no longer discretely appreciable ?consistent with treatment response of a hepatic metastasis. ?2. No evidence of new metastatic disease in the chest, abdomen, or pelvis. ?3. Interval increase in a small focus of consolidation and ?nodularity of the medial left upper lobe, consistent with minimal, ?ongoing atypical infection. Additional tiny bilateral pulmonary ?nodules  are stable and almost certainly incidental benign. Attention ?on follow-up. ?4. Status post right hemicolectomy and ileocolic anastomosis. ?  ?Rectal cancer metastasized to liver [facility])  ?06/15/2020 Procedure  ? Screening Colonoscopy by Dr [name]  ?IMPRESSION ?- Decreased sphincter tone and internal hemorrhoids that prolapse with straining, but require ?manual replacement into the anal canal (Grade III) found on digital rectal exam. ?- Patent side-to-side ileo-colonic anastomosis, characterized by healthy appearing mucosa. ?- The examined portion of the ileum was normal. ?- One diminutive polyp in the proximal transverse colon, removed with a cold biopsy forceps. ?Resected and retrieved. ?- Likely malignant partially obstructing tumor in the mid rectum. Biopsied. Tattooed. ?- The examination was otherwise normal on direct and retroflexion views. ?  ?06/15/2020 Initial Biopsy  ? Diagnosis ?1. Transverse Colon Polyp ?- HYPERPLASTIC POLYP ?2. Rectum, biopsy ?- ADENOCARCINOMA ARISING IN A TUBULAR ADENOMA WITH HIGH-GRADE DYSPLASIA. SEE NOTE ?Diagnosis Note ?2. Dr. [name] reviewed the case and concurs with the diagnosis. Dr. [name] was notified on 06/16/2020. ?  ?06/28/2020 Imaging  ? CT CAP  ?IMPRESSION: ?1. New low-density focus in the anterior aspect of the lateral ?segment LEFT hepatic lobe measuring 1.2 x 1.0 cm, compatible with ?small metastatic lesion in the LEFT hepatic lobe. ?2. Soft tissue in the RIGHT iliac fossa following RIGHT ?hemicolectomy invades the psoas musculature and is slowly enlarging ?over time, more linear on the prior study now highly concerning for ?recurrence/metastasis to this location. ?3. Signs of enteritis, potentially post radiation changes of the ?small bowel. Tethered small bowel in the RIGHT lower quadrant shows ?focal thickening and  narrowing suspicious for small bowel ?involvement and developing partial obstruction though currently ?contrast passes beyond this point into the colon. ?4.  Rectal thickening in this patient with known rectal mass as ?described. ?5. No evidence of metastatic disease in the chest. ?6. Stable small pulmonary nodules. ?7.  and aortic atherosclerosis. ?  ?[place]

## 2021-09-12 MED FILL — Dexamethasone Sodium Phosphate Inj 100 MG/10ML: INTRAMUSCULAR | Qty: 1 | Status: AC

## 2021-09-13 ENCOUNTER — Other Ambulatory Visit: Payer: Self-pay

## 2021-09-13 ENCOUNTER — Inpatient Hospital Stay: Payer: Federal, State, Local not specified - PPO | Admitting: Nurse Practitioner

## 2021-09-13 ENCOUNTER — Inpatient Hospital Stay: Payer: Federal, State, Local not specified - PPO

## 2021-09-13 ENCOUNTER — Encounter: Payer: Self-pay | Admitting: Nurse Practitioner

## 2021-09-13 VITALS — BP 129/78 | HR 93 | Temp 97.8°F | Resp 18 | Wt 113.1 lb

## 2021-09-13 DIAGNOSIS — C182 Malignant neoplasm of ascending colon: Secondary | ICD-10-CM | POA: Diagnosis not present

## 2021-09-13 DIAGNOSIS — C20 Malignant neoplasm of rectum: Secondary | ICD-10-CM | POA: Diagnosis not present

## 2021-09-13 DIAGNOSIS — C787 Secondary malignant neoplasm of liver and intrahepatic bile duct: Secondary | ICD-10-CM | POA: Diagnosis not present

## 2021-09-13 DIAGNOSIS — Z5112 Encounter for antineoplastic immunotherapy: Secondary | ICD-10-CM | POA: Diagnosis not present

## 2021-09-13 LAB — CMP (CANCER CENTER ONLY)
ALT: 6 U/L (ref 0–44)
AST: 13 U/L — ABNORMAL LOW (ref 15–41)
Albumin: 3.6 g/dL (ref 3.5–5.0)
Alkaline Phosphatase: 81 U/L (ref 38–126)
Anion gap: 3 — ABNORMAL LOW (ref 5–15)
BUN: 13 mg/dL (ref 8–23)
CO2: 27 mmol/L (ref 22–32)
Calcium: 8.6 mg/dL — ABNORMAL LOW (ref 8.9–10.3)
Chloride: 107 mmol/L (ref 98–111)
Creatinine: 0.69 mg/dL (ref 0.44–1.00)
GFR, Estimated: >60 mL/min (ref >60)
Glucose, Bld: 68 mg/dL — ABNORMAL LOW (ref 70–99)
Potassium: 4.1 mmol/L (ref 3.5–5.1)
Sodium: 137 mmol/L (ref 135–145)
Total Bilirubin: 0.4 mg/dL (ref 0.3–1.2)
Total Protein: 6.7 g/dL (ref 6.5–8.1)

## 2021-09-13 LAB — CBC WITH DIFFERENTIAL (CANCER CENTER ONLY)
Abs Immature Granulocytes: 0.02 10*3/uL (ref 0.00–0.07)
Basophils Absolute: 0 10*3/uL (ref 0.0–0.1)
Basophils Relative: 1 %
Eosinophils Absolute: 0.1 10*3/uL (ref 0.0–0.5)
Eosinophils Relative: 2 %
HCT: 30 % — ABNORMAL LOW (ref 36.0–46.0)
Hemoglobin: 9.4 g/dL — ABNORMAL LOW (ref 12.0–15.0)
Immature Granulocytes: 0 %
Lymphocytes Relative: 20 %
Lymphs Abs: 1.2 10*3/uL (ref 0.7–4.0)
MCH: 26 pg (ref 26.0–34.0)
MCHC: 31.3 g/dL (ref 30.0–36.0)
MCV: 83.1 fL (ref 80.0–100.0)
Monocytes Absolute: 0.6 10*3/uL (ref 0.1–1.0)
Monocytes Relative: 10 %
Neutro Abs: 4.1 10*3/uL (ref 1.7–7.7)
Neutrophils Relative %: 67 %
Platelet Count: 210 10*3/uL (ref 150–400)
RBC: 3.61 MIL/uL — ABNORMAL LOW (ref 3.87–5.11)
RDW: 15.8 % — ABNORMAL HIGH (ref 11.5–15.5)
WBC Count: 6 10*3/uL (ref 4.0–10.5)
nRBC: 0 % (ref 0.0–0.2)

## 2021-09-13 LAB — TOTAL PROTEIN, URINE DIPSTICK: Protein, ur: NEGATIVE mg/dL

## 2021-09-13 MED ORDER — SODIUM CHLORIDE 0.9 % IV SOLN
5.0000 mg/kg | Freq: Once | INTRAVENOUS | Status: AC
  Administered 2021-09-13: 225 mg via INTRAVENOUS
  Filled 2021-09-13: qty 9

## 2021-09-13 MED ORDER — SODIUM CHLORIDE 0.9% FLUSH
10.0000 mL | INTRAVENOUS | Status: DC | PRN
  Administered 2021-09-13: 10 mL

## 2021-09-13 MED ORDER — ATROPINE SULFATE 1 MG/ML IV SOLN
0.5000 mg | Freq: Once | INTRAVENOUS | Status: AC | PRN
  Administered 2021-09-13: 0.5 mg via INTRAVENOUS
  Filled 2021-09-13: qty 1

## 2021-09-13 MED ORDER — SODIUM CHLORIDE 0.9 % IV SOLN
130.0000 mg/m2 | Freq: Once | INTRAVENOUS | Status: AC
  Administered 2021-09-13: 180 mg via INTRAVENOUS
  Filled 2021-09-13: qty 9

## 2021-09-13 MED ORDER — SODIUM CHLORIDE 0.9 % IV SOLN
10.0000 mg | Freq: Once | INTRAVENOUS | Status: AC
  Administered 2021-09-13: 10 mg via INTRAVENOUS
  Filled 2021-09-13: qty 10

## 2021-09-13 MED ORDER — SODIUM CHLORIDE 0.9% FLUSH
10.0000 mL | Freq: Once | INTRAVENOUS | Status: AC
  Administered 2021-09-13: 10 mL

## 2021-09-13 MED ORDER — SODIUM CHLORIDE 0.9 % IV SOLN: Freq: Once | INTRAVENOUS | Status: AC

## 2021-09-13 MED ORDER — HEPARIN SOD (PORK) LOCK FLUSH 100 UNIT/ML IV SOLN
500.0000 [IU] | Freq: Once | INTRAVENOUS | Status: AC | PRN
  Administered 2021-09-13: 500 [IU]

## 2021-09-13 MED ORDER — PALONOSETRON HCL INJECTION 0.25 MG/5ML
0.2500 mg | Freq: Once | INTRAVENOUS | Status: AC
  Administered 2021-09-13: 0.25 mg via INTRAVENOUS
  Filled 2021-09-13: qty 5

## 2021-09-13 NOTE — Patient Instructions (Signed)
[facility]  Discharge Instructions: ?Thank you for choosing [facility] to provide your oncology and hematology care.  ? ?If you have a lab appointment with the [facility], please go directly to the [facility] and check in at the registration area. ?  ?Wear comfortable clothing and clothing appropriate for easy access to any Portacath or PICC line.  ? ?We strive to give you quality time with your provider. You may need to reschedule your appointment if you arrive late (15 or more minutes).  Arriving late affects you and other patients whose appointments are after yours.  Also, if you miss three or more appointments without notifying the office, you may be dismissed from the clinic at the provider?s discretion.    ?  ?For prescription refill requests, have your pharmacy contact our office and allow 72 hours for refills to be completed.   ? ?Today you received the following chemotherapy and/or immunotherapy agents: bevacizumab/irinotecan.    ?  ?To help prevent nausea and vomiting after your treatment, we encourage you to take your nausea medication as directed. ? ?BELOW ARE SYMPTOMS THAT SHOULD BE REPORTED IMMEDIATELY: ?*FEVER GREATER THAN 100.4 F (38 ?C) OR HIGHER ?*CHILLS OR SWEATING ?*NAUSEA AND VOMITING THAT IS NOT CONTROLLED WITH YOUR NAUSEA MEDICATION ?*UNUSUAL SHORTNESS OF BREATH ?*UNUSUAL BRUISING OR BLEEDING ?*URINARY PROBLEMS (pain or burning when urinating, or frequent urination) ?*BOWEL PROBLEMS (unusual diarrhea, constipation, pain near the anus) ?TENDERNESS IN MOUTH AND THROAT WITH OR WITHOUT PRESENCE OF ULCERS (sore throat, sores in mouth, or a toothache) ?UNUSUAL RASH, SWELLING OR PAIN  ?UNUSUAL VAGINAL DISCHARGE OR ITCHING  ? ?Items with * indicate a potential emergency and should be followed up as soon as possible or go to the Emergency Department if any problems should occur. ? ?Please show the CHEMOTHERAPY ALERT CARD or IMMUNOTHERAPY ALERT CARD  at check-in to the Emergency Department and triage nurse. ? ?Should you have questions after your visit or need to cancel or reschedule your appointment, please contact [facility]  Dept: [phone]  and follow the prompts.  Office hours are 8:00 a.m. to 4:30 p.m. Monday - Friday. Please note that voicemails left after 4:00 p.m. may not be returned until the following business day.  We are closed weekends and major holidays. You have access to a nurse at all times for urgent questions. Please call the main number to the clinic Dept: [phone] and follow the prompts. ? ? ?For any non-urgent questions, you may also contact your provider using MyChart. We now offer e-Visits for anyone 30 and older to request care online for non-urgent symptoms. For details visit [email or web address]. ?  ?Also download the MyChart app! Go to the app store, search "MyChart", open the app, select [employer], and log in with your MyChart username and password. ? ?Due to Covid, a mask is required upon entering the hospital/clinic. If you do not have a mask, one will be given to you upon arrival. For doctor visits, patients may have 1 support person aged 57 or older with them. For treatment visits, patients cannot have anyone with them due to current Covid guidelines and our immunocompromised population.  ? ?

## 2021-09-14 ENCOUNTER — Telehealth: Payer: Self-pay | Admitting: Hematology

## 2021-09-14 ENCOUNTER — Other Ambulatory Visit: Payer: Self-pay | Admitting: Hematology

## 2021-09-14 ENCOUNTER — Encounter: Payer: Self-pay | Admitting: Hematology

## 2021-09-14 NOTE — Telephone Encounter (Signed)
Scheduled follow-up appointments per 3/16 los. Patient is aware. 

## 2021-09-15 ENCOUNTER — Inpatient Hospital Stay: Payer: Federal, State, Local not specified - PPO

## 2021-09-15 ENCOUNTER — Other Ambulatory Visit: Payer: Self-pay

## 2021-09-15 VITALS — BP 144/84 | HR 86 | Temp 98.1°F | Resp 19

## 2021-09-15 DIAGNOSIS — C20 Malignant neoplasm of rectum: Secondary | ICD-10-CM

## 2021-09-15 DIAGNOSIS — Z5112 Encounter for antineoplastic immunotherapy: Secondary | ICD-10-CM | POA: Diagnosis not present

## 2021-09-15 MED ORDER — PEGFILGRASTIM-CBQV 6 MG/0.6ML ~~LOC~~ SOSY
6.0000 mg | PREFILLED_SYRINGE | Freq: Once | SUBCUTANEOUS | Status: AC
  Administered 2021-09-15: 6 mg via SUBCUTANEOUS

## 2021-09-15 NOTE — Patient Instructions (Signed)

## 2021-09-27 ENCOUNTER — Inpatient Hospital Stay: Payer: Federal, State, Local not specified - PPO | Admitting: Hematology

## 2021-09-27 ENCOUNTER — Inpatient Hospital Stay: Payer: Federal, State, Local not specified - PPO

## 2021-09-27 ENCOUNTER — Encounter: Payer: Self-pay | Admitting: Hematology

## 2021-09-27 ENCOUNTER — Encounter (INDEPENDENT_AMBULATORY_CARE_PROVIDER_SITE_OTHER): Payer: Federal, State, Local not specified - PPO | Admitting: Ophthalmology

## 2021-09-27 ENCOUNTER — Other Ambulatory Visit: Payer: Self-pay

## 2021-09-27 VITALS — BP 140/78 | HR 85 | Temp 98.3°F | Resp 16 | Ht 66.0 in | Wt 113.3 lb

## 2021-09-27 DIAGNOSIS — C20 Malignant neoplasm of rectum: Secondary | ICD-10-CM

## 2021-09-27 DIAGNOSIS — C787 Secondary malignant neoplasm of liver and intrahepatic bile duct: Secondary | ICD-10-CM | POA: Diagnosis not present

## 2021-09-27 DIAGNOSIS — Z5112 Encounter for antineoplastic immunotherapy: Secondary | ICD-10-CM | POA: Diagnosis not present

## 2021-09-27 DIAGNOSIS — Z95828 Presence of other vascular implants and grafts: Secondary | ICD-10-CM

## 2021-09-27 LAB — CBC WITH DIFFERENTIAL (CANCER CENTER ONLY)
Abs Immature Granulocytes: 0.03 10*3/uL (ref 0.00–0.07)
Basophils Absolute: 0 10*3/uL (ref 0.0–0.1)
Basophils Relative: 0 %
Eosinophils Absolute: 0.1 10*3/uL (ref 0.0–0.5)
Eosinophils Relative: 1 %
HCT: 30.6 % — ABNORMAL LOW (ref 36.0–46.0)
Hemoglobin: 9.4 g/dL — ABNORMAL LOW (ref 12.0–15.0)
Immature Granulocytes: 0 %
Lymphocytes Relative: 14 %
Lymphs Abs: 1 10*3/uL (ref 0.7–4.0)
MCH: 25.8 pg — ABNORMAL LOW (ref 26.0–34.0)
MCHC: 30.7 g/dL (ref 30.0–36.0)
MCV: 83.8 fL (ref 80.0–100.0)
Monocytes Absolute: 0.6 10*3/uL (ref 0.1–1.0)
Monocytes Relative: 8 %
Neutro Abs: 5.6 10*3/uL (ref 1.7–7.7)
Neutrophils Relative %: 77 %
Platelet Count: 207 10*3/uL (ref 150–400)
RBC: 3.65 MIL/uL — ABNORMAL LOW (ref 3.87–5.11)
RDW: 16 % — ABNORMAL HIGH (ref 11.5–15.5)
WBC Count: 7.3 10*3/uL (ref 4.0–10.5)
nRBC: 0 % (ref 0.0–0.2)

## 2021-09-27 LAB — CMP (CANCER CENTER ONLY)
ALT: 7 U/L (ref 0–44)
AST: 13 U/L — ABNORMAL LOW (ref 15–41)
Albumin: 3.6 g/dL (ref 3.5–5.0)
Alkaline Phosphatase: 85 U/L (ref 38–126)
Anion gap: 5 (ref 5–15)
BUN: 12 mg/dL (ref 8–23)
CO2: 27 mmol/L (ref 22–32)
Calcium: 8.8 mg/dL — ABNORMAL LOW (ref 8.9–10.3)
Chloride: 106 mmol/L (ref 98–111)
Creatinine: 0.65 mg/dL (ref 0.44–1.00)
GFR, Estimated: >60 mL/min (ref >60)
Glucose, Bld: 81 mg/dL (ref 70–99)
Potassium: 4.1 mmol/L (ref 3.5–5.1)
Sodium: 138 mmol/L (ref 135–145)
Total Bilirubin: 0.3 mg/dL (ref 0.3–1.2)
Total Protein: 6.6 g/dL (ref 6.5–8.1)

## 2021-09-27 LAB — TOTAL PROTEIN, URINE DIPSTICK: Protein, ur: NEGATIVE mg/dL

## 2021-09-27 MED ORDER — SODIUM CHLORIDE 0.9% FLUSH
10.0000 mL | Freq: Once | INTRAVENOUS | Status: AC
  Administered 2021-09-27: 10 mL

## 2021-09-27 MED ORDER — SODIUM CHLORIDE 0.9 % IV SOLN
5.0000 mg/kg | Freq: Once | INTRAVENOUS | Status: AC
  Administered 2021-09-27: 250 mg via INTRAVENOUS
  Filled 2021-09-27: qty 10

## 2021-09-27 MED ORDER — SODIUM CHLORIDE 0.9 % IV SOLN: Freq: Once | INTRAVENOUS | Status: AC

## 2021-09-27 MED ORDER — PALONOSETRON HCL INJECTION 0.25 MG/5ML
0.2500 mg | Freq: Once | INTRAVENOUS | Status: AC
  Administered 2021-09-27: 0.25 mg via INTRAVENOUS
  Filled 2021-09-27: qty 5

## 2021-09-27 MED ORDER — ATROPINE SULFATE 1 MG/ML IV SOLN
0.5000 mg | Freq: Once | INTRAVENOUS | Status: AC | PRN
  Administered 2021-09-27: 0.5 mg via INTRAVENOUS
  Filled 2021-09-27: qty 1

## 2021-09-27 MED ORDER — SODIUM CHLORIDE 0.9 % IV SOLN
10.0000 mg | Freq: Once | INTRAVENOUS | Status: AC
  Administered 2021-09-27: 10 mg via INTRAVENOUS
  Filled 2021-09-27: qty 10

## 2021-09-27 MED ORDER — HEPARIN SOD (PORK) LOCK FLUSH 100 UNIT/ML IV SOLN
500.0000 [IU] | Freq: Once | INTRAVENOUS | Status: AC
  Administered 2021-09-27: 500 [IU]

## 2021-09-27 MED ORDER — SODIUM CHLORIDE 0.9 % IV SOLN
130.0000 mg/m2 | Freq: Once | INTRAVENOUS | Status: AC
  Administered 2021-09-27: 200 mg via INTRAVENOUS
  Filled 2021-09-27: qty 10

## 2021-09-27 NOTE — Patient Instructions (Signed)
[facility]  Discharge Instructions: ?Thank you for choosing [facility] to provide your oncology and hematology care.  ? ?If you have a lab appointment with the [facility], please go directly to the [facility] and check in at the registration area. ?  ?Wear comfortable clothing and clothing appropriate for easy access to any Portacath or PICC line.  ? ?We strive to give you quality time with your provider. You may need to reschedule your appointment if you arrive late (15 or more minutes).  Arriving late affects you and other patients whose appointments are after yours.  Also, if you miss three or more appointments without notifying the office, you may be dismissed from the clinic at the provider?s discretion.    ?  ?For prescription refill requests, have your pharmacy contact our office and allow 72 hours for refills to be completed.   ? ?Today you received the following chemotherapy and/or immunotherapy agents: Bevacizumab, Irinotecan.    ?  ?To help prevent nausea and vomiting after your treatment, we encourage you to take your nausea medication as directed. ? ?BELOW ARE SYMPTOMS THAT SHOULD BE REPORTED IMMEDIATELY: ?*FEVER GREATER THAN 100.4 F (38 ?C) OR HIGHER ?*CHILLS OR SWEATING ?*NAUSEA AND VOMITING THAT IS NOT CONTROLLED WITH YOUR NAUSEA MEDICATION ?*UNUSUAL SHORTNESS OF BREATH ?*UNUSUAL BRUISING OR BLEEDING ?*URINARY PROBLEMS (pain or burning when urinating, or frequent urination) ?*BOWEL PROBLEMS (unusual diarrhea, constipation, pain near the anus) ?TENDERNESS IN MOUTH AND THROAT WITH OR WITHOUT PRESENCE OF ULCERS (sore throat, sores in mouth, or a toothache) ?UNUSUAL RASH, SWELLING OR PAIN  ?UNUSUAL VAGINAL DISCHARGE OR ITCHING  ? ?Items with * indicate a potential emergency and should be followed up as soon as possible or go to the Emergency Department if any problems should occur. ? ?Please show the CHEMOTHERAPY ALERT CARD or IMMUNOTHERAPY ALERT CARD  at check-in to the Emergency Department and triage nurse. ? ?Should you have questions after your visit or need to cancel or reschedule your appointment, please contact [facility]  Dept: [phone]  and follow the prompts.  Office hours are 8:00 a.m. to 4:30 p.m. Monday - Friday. Please note that voicemails left after 4:00 p.m. may not be returned until the following business day.  We are closed weekends and major holidays. You have access to a nurse at all times for urgent questions. Please call the main number to the clinic Dept: [phone] and follow the prompts. ? ? ?For any non-urgent questions, you may also contact your provider using MyChart. We now offer e-Visits for anyone 45 and older to request care online for non-urgent symptoms. For details visit [email or web address]. ?  ?Also download the MyChart app! Go to the app store, search "MyChart", open the app, select , and log in with your MyChart username and password. ? ?Due to Covid, a mask is required upon entering the hospital/clinic. If you do not have a mask, one will be given to you upon arrival. For doctor visits, patients may have 1 support person aged 50 or older with them. For treatment visits, patients cannot have anyone with them due to current Covid guidelines and our immunocompromised population.  ? ?

## 2021-09-27 NOTE — Progress Notes (Signed)
?[facility]   ?Telephone:(336) [phone] [phone]   ?Clinic Follow up Note  ? ?Patient Care Team: ?Patient, No Pcp Per (Inactive) as PCP - General (General Practice) ?[name], NP as Nurse Practitioner (Oncology) ?[name], MD as Consulting Physician (Oncology) ?[name], RN (Inactive) as Oncology Nurse Navigator ? ?Date of Service:  09/27/2021 ? ?CHIEF COMPLAINT: f/u of metastatic rectal cancer, h/o colon cancer ? ?CURRENT THERAPY:  ?Maintenance irinotecan and bevacizumab, q2weeks, from 01/24/21 ? ?ASSESSMENT & PLAN:  ?Brittney Spencer is a 71 y.o. female with  ? ?1. Rectal adenocarcinoma, with liver and right pelvic metastasis, recurrence from previous colon cancer vs new primary   ?-Diagnosed on routine screening colonoscopy 05/2020, rectal mass biopsy showed invasive adenocarcinoma, arising from a tubular adenoma ?-Her 07/12/20 PET showed positive uptake of known rectal tumor and soft tissue mass within the right iliac fossa indicating recurrent prior colon cancer. There is also left lobe of liver lesion is FDG avid concerning for liver metastasis. pt declined liver biopsy ?-whether this is metastatic rectal or recurrent/metastatic colon cancer, this is not curable but still treatable. She previously declined referral for HIPEC ?-She began first-line chemo with dose-reduced FOLFIRI on 08/06/20. ?-Bevacizumab added, and irinotecan/5FU increased with cycle 2, but due to poor tolerance/diarrhea irinotecan was reduced with cycle 3 to 130 mg per metered squared, tolerated better ?-She changed to maintenance therapy with irinotecan and bevacizumab every 2 weeks on 01/24/21 ?-Restaging CT CAP 08/13/21 showed continued good response to treatment, previous liver and right pelvic metastasis are not visible on current scan, and no new metastatic disease. We will continue maintenance therapy.  ?-labs reviewed, overall stable. Urine protein has remained negative, so we will monitor this  every other treatment now. Her weight has been stable and over 100 since 04/25/21. She is tolerating treatment very well. ?  ?2. Cancer of ascending colon, pT4bN1bM0, stage IIIC, MSI-stable, (+) surgical margins at pelvic wall     ?-She was diagnosed in 12/2015. She is s/p right hemicolectomy with right salpingo oophorectomy and adjuvant ChemoRT. ?-Her 04/2019 CT scan was NED  ?-With recent rectal cancer diagnosis, her PET from 07/12/20 indicates soft tissue mass within the right iliac fossa is noted and consistent with local tumor recurrence from previous ascending colon tumor. ?  ?3. Mild Anemia ?-Has been stable and mild for the past year.  ?-worse lately due to rectal cancer and starting chemo ?-denies bleeding ?-monitoring, stable in 9-range. ?  ?4. HTN, Anxiety ?-She'll follow-up with her primary care physician and continue medication ?-She uses half tab Xanax as needed, mostly only on treatment days. ?-stable ?  ?  ?PLAN: ?-proceed with irinotecan/beva today at same dose  ?            -[name] injection on day 3  ?-lab, flush, and next irinotecan/beva 2, 4, and 6 weeks ?-f/u every other treatment, will cancel OV in 2 weeks ? ? ?No problem-specific Assessment & Plan notes found for this encounter. ? ? ?SUMMARY OF ONCOLOGIC HISTORY: ?Oncology History Overview Note  ?Cancer Staging ?Cancer of ascending colon ([place]) ?Staging form: Colon and Rectum, AJCC 7th Edition ?- Clinical stage from 02/07/2016: Stage IIIC (T4b, N1b, M0) - Signed by [name], MD on 03/04/2016 ?Laterality: Right ?Residual tumor (R): R2 - Macroscopic ? ?  ?Cancer of ascending colon ([place])  ?10/25/2015 Imaging  ? CT ABD/PELVIS:  Inflammatory changes inferior to the cecal ?tip appear improved, there is still irregular soft tissue thickening ?  of the cecal tip, and there are adjacent prominent lymph nodes in ?the ileocolonic mesentery, measuring 13 mm on image 49 and 8 mm on ?image 52. In addition, there is a 2.5 x 1.8 cm nodule on image 46 ?which has  central low density. Therefore, these findings are ?moderately suspicious for an underlying cecal malignancy with ?perforation.  ?  ?01/19/2016 Procedure  ? COLONOSCOPY per Dr. [name]: Fungating, ulcerated mass almost obstructing mid ascending colon ?  ?01/19/2016 Initial Biopsy  ? Diagnosis ?Surgical [P], cecal mass ?- INVASIVE ADENOCARCINOMA WITH ULCERATION. ?- SEE COMMENT. ?  ?02/05/2016 Tumor Marker  ? Patient's tumor was tested for the following markers: CEA ?Results of the tumor marker test revealed 5.7. ?  ?02/07/2016 Initial Diagnosis  ? Cancer of ascending colon [facility]) ?  ?02/07/2016 Definitive Surgery  ? Laparoscopic assisted right hemicolectomy and right salpingo oopherectomy--Dr. Hoxworth ?  ?02/07/2016 Pathologic Stage  ? p T4 N1b   ?2/43 nodes + ?  ?02/07/2016 Pathology Results  ? MMR normal; G2 adenocarcinoma;proximal & distal margins negative; soft tissue mass on pelvic sidewall + for adenocarcinoma with positive margin ?MSI Stable ?  ?03/08/2016 Imaging  ? CT chest negative for metastasis.  ?  ?03/19/2016 - 04/25/2016 Radiation Therapy  ? Adjuvant irradiation, 50 gray in 28 fractions ?  ?03/19/2016 - 04/22/2016 Chemotherapy  ? Xeloda 1500 mg twice daily, started on 03/19/2016, dose reduced to 1000 mg twice daily from week 3 due to neutropenia, and patient stopped 3 days before last dose radiation due to difficulty swallowing the pill ? ?  ?05/20/2016 -  Adjuvant Chemotherapy  ? Patient declined adjuvant chemotherapy ?  ?09/16/2016 Imaging  ? CT CAP w Contrast ?1. No evidence of local tumor recurrence at the ileocolic ?anastomosis. ?2. No findings suspicious for metastatic disease in the chest, ?abdomen or pelvis. ?3. Nonspecific trace free fluid in the pelvic cul-de-sac. ?4. Stable solitary 3 mm right upper lobe pulmonary nodule, for which ?6 month stability has been demonstrated, probably benign. ?5. Additional findings include stable right posterior pericardial ?cyst and small calcified uterine fibroids. ?   ?05/13/2017 Imaging  ? CT CAP W Contrast 05/13/17 ?IMPRESSION: ?1. No current findings of residual or recurrent malignancy. ?2. Mild prominence of stool throughout the colon. Nondistended ?portions of the rectum. ?3. Several tiny pulmonary nodules are stable from the earliest ?available comparison of 03/08/2016 and probably benign, but may ?merit surveillance. ?4. 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 ?Lumbar spondylosis and degenerative disc disease. Stable amount of ?trace free pelvic fluid. ?  ?04/27/2018 Imaging  ? 04/27/2018 CT CAP ?IMPRESSION: ?Stable exam. No evidence of recurrent or metastatic carcinoma within ?the chest, abdomen, or pelvis ?  ?04/19/2019 Imaging  ? CT CAP W Contrast  ?IMPRESSION: ?Chest Impression: ?  ?1. No evidence of thoracic metastasis. ?2. Stable small bilateral pulmonary nodules. ?  ?Abdomen / Pelvis Impression: ?  ?1. No evidence local colorectal carcinoma recurrence or metastasis ?in the abdomen pelvis. ?2. Post RIGHT hemicolectomy. ?  ?01/22/2021 Imaging  ? CT CAP ? ?IMPRESSION: ?CT CHEST IMPRESSION ?  ?1. Similar nonspecific pulmonary nodules. ?2. New posterior left upper lobe reticulonodular opacity, suspicious ?for interval mild infection or inflammation. ?3. No thoracic adenopathy. ?  ?CT ABDOMEN AND PELVIS IMPRESSION ?  ?1. Further decrease in size of high left hepatic lobe 3 mm ?low-density lesion. No new or progressive metastatic disease within the abdomen or pelvis. ?2. Similar trace free pelvic fluid. ?3. Similar  nonspecific mid rectal wall thickening. ?4.  Aortic Atherosclerosis (ICD10-I70.0). ?  ?04/23/2021 Imaging  ? CT CAP ? ?IMPRESSION: ?1. Treated metastatic lesion between segments 2 and 3 of the liver, ?slightly smaller and less distinct than prior examination. No other ?signs of definite metastatic disease elsewhere in the abdomen or ?pelvis. ?2. Multiple small pulmonary  nodules, stable compared to the prior ?examination, favored to be benign. No definitive findings to suggest ?metastatic disease to the thorax. ?3. Aortic atherosclerosis. ?4. Additional incidental findings, as abo

## 2021-09-29 ENCOUNTER — Inpatient Hospital Stay: Payer: Federal, State, Local not specified - PPO | Attending: Nurse Practitioner

## 2021-09-29 VITALS — BP 129/81 | HR 88 | Temp 98.1°F | Resp 16 | Ht 66.0 in

## 2021-09-29 DIAGNOSIS — Z9221 Personal history of antineoplastic chemotherapy: Secondary | ICD-10-CM | POA: Diagnosis not present

## 2021-09-29 DIAGNOSIS — Z79899 Other long term (current) drug therapy: Secondary | ICD-10-CM | POA: Insufficient documentation

## 2021-09-29 DIAGNOSIS — C787 Secondary malignant neoplasm of liver and intrahepatic bile duct: Secondary | ICD-10-CM | POA: Diagnosis not present

## 2021-09-29 DIAGNOSIS — I10 Essential (primary) hypertension: Secondary | ICD-10-CM | POA: Insufficient documentation

## 2021-09-29 DIAGNOSIS — C182 Malignant neoplasm of ascending colon: Secondary | ICD-10-CM | POA: Diagnosis present

## 2021-09-29 DIAGNOSIS — Z5112 Encounter for antineoplastic immunotherapy: Secondary | ICD-10-CM | POA: Insufficient documentation

## 2021-09-29 DIAGNOSIS — Z5189 Encounter for other specified aftercare: Secondary | ICD-10-CM | POA: Diagnosis not present

## 2021-09-29 DIAGNOSIS — Z1509 Genetic susceptibility to other malignant neoplasm: Secondary | ICD-10-CM | POA: Insufficient documentation

## 2021-09-29 DIAGNOSIS — Z923 Personal history of irradiation: Secondary | ICD-10-CM | POA: Insufficient documentation

## 2021-09-29 DIAGNOSIS — D649 Anemia, unspecified: Secondary | ICD-10-CM | POA: Insufficient documentation

## 2021-09-29 DIAGNOSIS — F419 Anxiety disorder, unspecified: Secondary | ICD-10-CM | POA: Insufficient documentation

## 2021-09-29 MED ORDER — PEGFILGRASTIM-CBQV 6 MG/0.6ML ~~LOC~~ SOSY
6.0000 mg | PREFILLED_SYRINGE | Freq: Once | SUBCUTANEOUS | Status: AC
  Administered 2021-09-29: 6 mg via SUBCUTANEOUS

## 2021-10-01 ENCOUNTER — Telehealth: Payer: Self-pay | Admitting: Hematology

## 2021-10-01 NOTE — Telephone Encounter (Signed)
Left message with rescheduled upcoming appointment per 3/30 los. ?

## 2021-10-18 ENCOUNTER — Other Ambulatory Visit: Payer: Self-pay

## 2021-10-18 ENCOUNTER — Inpatient Hospital Stay: Payer: Federal, State, Local not specified - PPO

## 2021-10-18 ENCOUNTER — Inpatient Hospital Stay: Payer: Federal, State, Local not specified - PPO | Admitting: Nurse Practitioner

## 2021-10-18 VITALS — BP 135/76 | HR 84 | Temp 98.1°F | Resp 18 | Ht 66.0 in | Wt 114.8 lb

## 2021-10-18 DIAGNOSIS — Z5112 Encounter for antineoplastic immunotherapy: Secondary | ICD-10-CM | POA: Diagnosis not present

## 2021-10-18 DIAGNOSIS — Z95828 Presence of other vascular implants and grafts: Secondary | ICD-10-CM

## 2021-10-18 DIAGNOSIS — C20 Malignant neoplasm of rectum: Secondary | ICD-10-CM

## 2021-10-18 LAB — CBC WITH DIFFERENTIAL (CANCER CENTER ONLY)
Abs Immature Granulocytes: 0.01 10*3/uL (ref 0.00–0.07)
Basophils Absolute: 0 10*3/uL (ref 0.0–0.1)
Basophils Relative: 0 %
Eosinophils Absolute: 0.1 10*3/uL (ref 0.0–0.5)
Eosinophils Relative: 3 %
HCT: 29.8 % — ABNORMAL LOW (ref 36.0–46.0)
Hemoglobin: 9.4 g/dL — ABNORMAL LOW (ref 12.0–15.0)
Immature Granulocytes: 0 %
Lymphocytes Relative: 23 %
Lymphs Abs: 1 10*3/uL (ref 0.7–4.0)
MCH: 26.3 pg (ref 26.0–34.0)
MCHC: 31.5 g/dL (ref 30.0–36.0)
MCV: 83.2 fL (ref 80.0–100.0)
Monocytes Absolute: 0.6 10*3/uL (ref 0.1–1.0)
Monocytes Relative: 12 %
Neutro Abs: 2.7 10*3/uL (ref 1.7–7.7)
Neutrophils Relative %: 62 %
Platelet Count: 246 10*3/uL (ref 150–400)
RBC: 3.58 MIL/uL — ABNORMAL LOW (ref 3.87–5.11)
RDW: 16.7 % — ABNORMAL HIGH (ref 11.5–15.5)
WBC Count: 4.4 10*3/uL (ref 4.0–10.5)
nRBC: 0 % (ref 0.0–0.2)

## 2021-10-18 LAB — CMP (CANCER CENTER ONLY)
ALT: 6 U/L (ref 0–44)
AST: 13 U/L — ABNORMAL LOW (ref 15–41)
Albumin: 3.5 g/dL (ref 3.5–5.0)
Alkaline Phosphatase: 73 U/L (ref 38–126)
Anion gap: 3 — ABNORMAL LOW (ref 5–15)
BUN: 13 mg/dL (ref 8–23)
CO2: 28 mmol/L (ref 22–32)
Calcium: 8.7 mg/dL — ABNORMAL LOW (ref 8.9–10.3)
Chloride: 107 mmol/L (ref 98–111)
Creatinine: 0.7 mg/dL (ref 0.44–1.00)
GFR, Estimated: >60 mL/min (ref >60)
Glucose, Bld: 87 mg/dL (ref 70–99)
Potassium: 3.8 mmol/L (ref 3.5–5.1)
Sodium: 138 mmol/L (ref 135–145)
Total Bilirubin: 0.3 mg/dL (ref 0.3–1.2)
Total Protein: 6.6 g/dL (ref 6.5–8.1)

## 2021-10-18 LAB — CEA (IN HOUSE-CHCC): CEA (CHCC-In House): 7.09 ng/mL — ABNORMAL HIGH (ref 0.00–5.00)

## 2021-10-18 LAB — TOTAL PROTEIN, URINE DIPSTICK: Protein, ur: NEGATIVE mg/dL

## 2021-10-18 MED ORDER — PALONOSETRON HCL INJECTION 0.25 MG/5ML
0.2500 mg | Freq: Once | INTRAVENOUS | Status: AC
  Administered 2021-10-18: 0.25 mg via INTRAVENOUS
  Filled 2021-10-18: qty 5

## 2021-10-18 MED ORDER — SODIUM CHLORIDE 0.9 % IV SOLN
200.0000 mg | Freq: Once | INTRAVENOUS | Status: AC
  Administered 2021-10-18: 200 mg via INTRAVENOUS
  Filled 2021-10-18: qty 10

## 2021-10-18 MED ORDER — SODIUM CHLORIDE 0.9 % IV SOLN: Freq: Once | INTRAVENOUS | Status: AC

## 2021-10-18 MED ORDER — HEPARIN SOD (PORK) LOCK FLUSH 100 UNIT/ML IV SOLN
500.0000 [IU] | Freq: Once | INTRAVENOUS | Status: AC | PRN
  Administered 2021-10-18: 500 [IU]

## 2021-10-18 MED ORDER — SODIUM CHLORIDE 0.9 % IV SOLN
250.0000 mg | Freq: Once | INTRAVENOUS | Status: AC
  Administered 2021-10-18: 250 mg via INTRAVENOUS
  Filled 2021-10-18: qty 10

## 2021-10-18 MED ORDER — SODIUM CHLORIDE 0.9 % IV SOLN
10.0000 mg | Freq: Once | INTRAVENOUS | Status: AC
  Administered 2021-10-18: 10 mg via INTRAVENOUS
  Filled 2021-10-18: qty 10

## 2021-10-18 MED ORDER — ATROPINE SULFATE 1 MG/ML IV SOLN
0.5000 mg | Freq: Once | INTRAVENOUS | Status: AC | PRN
  Administered 2021-10-18: 0.5 mg via INTRAVENOUS
  Filled 2021-10-18: qty 1

## 2021-10-18 MED ORDER — SODIUM CHLORIDE 0.9% FLUSH
10.0000 mL | INTRAVENOUS | Status: DC | PRN
  Administered 2021-10-18: 10 mL

## 2021-10-18 MED ORDER — SODIUM CHLORIDE 0.9% FLUSH
10.0000 mL | Freq: Once | INTRAVENOUS | Status: AC
  Administered 2021-10-18: 10 mL

## 2021-10-18 NOTE — Progress Notes (Signed)
Adjust doses for current weight and same as last treatment per Bella Kennedy ?

## 2021-10-18 NOTE — Patient Instructions (Signed)
[facility]  Discharge Instructions: ?Thank you for choosing [facility] to provide your oncology and hematology care.  ? ?If you have a lab appointment with the [facility], please go directly to the [facility] and check in at the registration area. ?  ?Wear comfortable clothing and clothing appropriate for easy access to any Portacath or PICC line.  ? ?We strive to give you quality time with your provider. You may need to reschedule your appointment if you arrive late (15 or more minutes).  Arriving late affects you and other patients whose appointments are after yours.  Also, if you miss three or more appointments without notifying the office, you may be dismissed from the clinic at the provider?s discretion.    ?  ?For prescription refill requests, have your pharmacy contact our office and allow 72 hours for refills to be completed.   ? ?Today you received the following chemotherapy and/or immunotherapy agents: bevacizumab/irinotecan.    ?  ?To help prevent nausea and vomiting after your treatment, we encourage you to take your nausea medication as directed. ? ?BELOW ARE SYMPTOMS THAT SHOULD BE REPORTED IMMEDIATELY: ?*FEVER GREATER THAN 100.4 F (38 ?C) OR HIGHER ?*CHILLS OR SWEATING ?*NAUSEA AND VOMITING THAT IS NOT CONTROLLED WITH YOUR NAUSEA MEDICATION ?*UNUSUAL SHORTNESS OF BREATH ?*UNUSUAL BRUISING OR BLEEDING ?*URINARY PROBLEMS (pain or burning when urinating, or frequent urination) ?*BOWEL PROBLEMS (unusual diarrhea, constipation, pain near the anus) ?TENDERNESS IN MOUTH AND THROAT WITH OR WITHOUT PRESENCE OF ULCERS (sore throat, sores in mouth, or a toothache) ?UNUSUAL RASH, SWELLING OR PAIN  ?UNUSUAL VAGINAL DISCHARGE OR ITCHING  ? ?Items with * indicate a potential emergency and should be followed up as soon as possible or go to the Emergency Department if any problems should occur. ? ?Please show the CHEMOTHERAPY ALERT CARD or IMMUNOTHERAPY ALERT CARD  at check-in to the Emergency Department and triage nurse. ? ?Should you have questions after your visit or need to cancel or reschedule your appointment, please contact [facility]  Dept: [phone]  and follow the prompts.  Office hours are 8:00 a.m. to 4:30 p.m. Monday - Friday. Please note that voicemails left after 4:00 p.m. may not be returned until the following business day.  We are closed weekends and major holidays. You have access to a nurse at all times for urgent questions. Please call the main number to the clinic Dept: [phone] and follow the prompts. ? ? ?For any non-urgent questions, you may also contact your provider using MyChart. We now offer e-Visits for anyone 20 and older to request care online for non-urgent symptoms. For details visit [email or web address]. ?  ?Also download the MyChart app! Go to the app store, search "MyChart", open the app, select [facility], and log in with your MyChart username and password. ? ?Due to Covid, a mask is required upon entering the hospital/clinic. If you do not have a mask, one will be given to you upon arrival. For doctor visits, patients may have 1 support person aged 55 or older with them. For treatment visits, patients cannot have anyone with them due to current Covid guidelines and our immunocompromised population.  ? ?

## 2021-10-20 ENCOUNTER — Inpatient Hospital Stay: Payer: Federal, State, Local not specified - PPO

## 2021-10-20 VITALS — BP 137/82 | HR 80 | Temp 97.5°F | Resp 16

## 2021-10-20 DIAGNOSIS — Z5112 Encounter for antineoplastic immunotherapy: Secondary | ICD-10-CM | POA: Diagnosis not present

## 2021-10-20 DIAGNOSIS — C20 Malignant neoplasm of rectum: Secondary | ICD-10-CM

## 2021-10-20 MED ORDER — PEGFILGRASTIM-CBQV 6 MG/0.6ML ~~LOC~~ SOSY
6.0000 mg | PREFILLED_SYRINGE | Freq: Once | SUBCUTANEOUS | Status: AC
  Administered 2021-10-20: 6 mg via SUBCUTANEOUS
  Filled 2021-10-20: qty 0.6

## 2021-10-26 ENCOUNTER — Other Ambulatory Visit: Payer: Self-pay | Admitting: Hematology

## 2021-10-26 DIAGNOSIS — C182 Malignant neoplasm of ascending colon: Secondary | ICD-10-CM

## 2021-11-01 ENCOUNTER — Inpatient Hospital Stay: Payer: Federal, State, Local not specified - PPO | Attending: Nurse Practitioner

## 2021-11-01 ENCOUNTER — Other Ambulatory Visit: Payer: Self-pay

## 2021-11-01 ENCOUNTER — Inpatient Hospital Stay: Payer: Federal, State, Local not specified - PPO

## 2021-11-01 ENCOUNTER — Inpatient Hospital Stay: Payer: Federal, State, Local not specified - PPO | Admitting: Hematology

## 2021-11-01 VITALS — BP 134/82 | HR 87 | Temp 98.7°F | Resp 18 | Ht 66.0 in | Wt 114.9 lb

## 2021-11-01 DIAGNOSIS — C787 Secondary malignant neoplasm of liver and intrahepatic bile duct: Secondary | ICD-10-CM | POA: Diagnosis not present

## 2021-11-01 DIAGNOSIS — C20 Malignant neoplasm of rectum: Secondary | ICD-10-CM

## 2021-11-01 DIAGNOSIS — Z9221 Personal history of antineoplastic chemotherapy: Secondary | ICD-10-CM | POA: Diagnosis not present

## 2021-11-01 DIAGNOSIS — Z5112 Encounter for antineoplastic immunotherapy: Secondary | ICD-10-CM | POA: Diagnosis present

## 2021-11-01 DIAGNOSIS — D649 Anemia, unspecified: Secondary | ICD-10-CM | POA: Diagnosis not present

## 2021-11-01 DIAGNOSIS — Z5189 Encounter for other specified aftercare: Secondary | ICD-10-CM | POA: Diagnosis not present

## 2021-11-01 DIAGNOSIS — C182 Malignant neoplasm of ascending colon: Secondary | ICD-10-CM | POA: Diagnosis present

## 2021-11-01 DIAGNOSIS — I10 Essential (primary) hypertension: Secondary | ICD-10-CM | POA: Insufficient documentation

## 2021-11-01 DIAGNOSIS — Z95828 Presence of other vascular implants and grafts: Secondary | ICD-10-CM

## 2021-11-01 DIAGNOSIS — Z1509 Genetic susceptibility to other malignant neoplasm: Secondary | ICD-10-CM | POA: Diagnosis not present

## 2021-11-01 DIAGNOSIS — F419 Anxiety disorder, unspecified: Secondary | ICD-10-CM | POA: Diagnosis not present

## 2021-11-01 DIAGNOSIS — Z5111 Encounter for antineoplastic chemotherapy: Secondary | ICD-10-CM | POA: Diagnosis present

## 2021-11-01 DIAGNOSIS — Z923 Personal history of irradiation: Secondary | ICD-10-CM | POA: Diagnosis not present

## 2021-11-01 DIAGNOSIS — Z79899 Other long term (current) drug therapy: Secondary | ICD-10-CM | POA: Diagnosis not present

## 2021-11-01 LAB — CBC WITH DIFFERENTIAL (CANCER CENTER ONLY)
Abs Immature Granulocytes: 0.02 10*3/uL (ref 0.00–0.07)
Basophils Absolute: 0 10*3/uL (ref 0.0–0.1)
Basophils Relative: 0 %
Eosinophils Absolute: 0.1 10*3/uL (ref 0.0–0.5)
Eosinophils Relative: 2 %
HCT: 30.7 % — ABNORMAL LOW (ref 36.0–46.0)
Hemoglobin: 9.5 g/dL — ABNORMAL LOW (ref 12.0–15.0)
Immature Granulocytes: 0 %
Lymphocytes Relative: 19 %
Lymphs Abs: 1.2 10*3/uL (ref 0.7–4.0)
MCH: 25.9 pg — ABNORMAL LOW (ref 26.0–34.0)
MCHC: 30.9 g/dL (ref 30.0–36.0)
MCV: 83.7 fL (ref 80.0–100.0)
Monocytes Absolute: 0.6 10*3/uL (ref 0.1–1.0)
Monocytes Relative: 9 %
Neutro Abs: 4.3 10*3/uL (ref 1.7–7.7)
Neutrophils Relative %: 70 %
Platelet Count: 204 10*3/uL (ref 150–400)
RBC: 3.67 MIL/uL — ABNORMAL LOW (ref 3.87–5.11)
RDW: 16.9 % — ABNORMAL HIGH (ref 11.5–15.5)
WBC Count: 6.3 10*3/uL (ref 4.0–10.5)
nRBC: 0 % (ref 0.0–0.2)

## 2021-11-01 LAB — CMP (CANCER CENTER ONLY)
ALT: 7 U/L (ref 0–44)
AST: 13 U/L — ABNORMAL LOW (ref 15–41)
Albumin: 3.6 g/dL (ref 3.5–5.0)
Alkaline Phosphatase: 85 U/L (ref 38–126)
Anion gap: 3 — ABNORMAL LOW (ref 5–15)
BUN: 14 mg/dL (ref 8–23)
CO2: 28 mmol/L (ref 22–32)
Calcium: 8.7 mg/dL — ABNORMAL LOW (ref 8.9–10.3)
Chloride: 106 mmol/L (ref 98–111)
Creatinine: 0.68 mg/dL (ref 0.44–1.00)
GFR, Estimated: >60 mL/min (ref >60)
Glucose, Bld: 80 mg/dL (ref 70–99)
Potassium: 3.9 mmol/L (ref 3.5–5.1)
Sodium: 137 mmol/L (ref 135–145)
Total Bilirubin: 0.4 mg/dL (ref 0.3–1.2)
Total Protein: 6.6 g/dL (ref 6.5–8.1)

## 2021-11-01 MED ORDER — SODIUM CHLORIDE 0.9 % IV SOLN
250.0000 mg | Freq: Once | INTRAVENOUS | Status: AC
  Administered 2021-11-01: 250 mg via INTRAVENOUS
  Filled 2021-11-01: qty 10

## 2021-11-01 MED ORDER — SODIUM CHLORIDE 0.9% FLUSH
10.0000 mL | Freq: Once | INTRAVENOUS | Status: AC
  Administered 2021-11-01: 10 mL

## 2021-11-01 MED ORDER — SODIUM CHLORIDE 0.9 % IV SOLN
130.0000 mg/m2 | Freq: Once | INTRAVENOUS | Status: AC
  Administered 2021-11-01: 200 mg via INTRAVENOUS
  Filled 2021-11-01: qty 10

## 2021-11-01 MED ORDER — SODIUM CHLORIDE 0.9% FLUSH
10.0000 mL | INTRAVENOUS | Status: DC | PRN
  Administered 2021-11-01: 10 mL

## 2021-11-01 MED ORDER — ATROPINE SULFATE 1 MG/ML IV SOLN
0.5000 mg | Freq: Once | INTRAVENOUS | Status: AC | PRN
  Administered 2021-11-01: 0.5 mg via INTRAVENOUS
  Filled 2021-11-01: qty 1

## 2021-11-01 MED ORDER — SODIUM CHLORIDE 0.9 % IV SOLN
10.0000 mg | Freq: Once | INTRAVENOUS | Status: AC
  Administered 2021-11-01: 10 mg via INTRAVENOUS
  Filled 2021-11-01: qty 10

## 2021-11-01 MED ORDER — SODIUM CHLORIDE 0.9 % IV SOLN: Freq: Once | INTRAVENOUS | Status: AC

## 2021-11-01 MED ORDER — HEPARIN SOD (PORK) LOCK FLUSH 100 UNIT/ML IV SOLN
500.0000 [IU] | Freq: Once | INTRAVENOUS | Status: AC | PRN
  Administered 2021-11-01: 500 [IU]

## 2021-11-01 MED ORDER — PALONOSETRON HCL INJECTION 0.25 MG/5ML
0.2500 mg | Freq: Once | INTRAVENOUS | Status: AC
  Administered 2021-11-01: 0.25 mg via INTRAVENOUS
  Filled 2021-11-01: qty 5

## 2021-11-01 NOTE — Progress Notes (Signed)
?[facility]   ?Telephone:(336) [phone] [phone]   ?Clinic Follow up Note  ? ?Patient Care Team: ?Patient, No Pcp Per (Inactive) as PCP - General (General Practice) ?Brittney Feeling, NP as Nurse Practitioner (Oncology) ?[name], MD as Consulting Physician (Oncology) ?[name], RN (Inactive) as Oncology Nurse Navigator ? ?Date of Service:  11/01/2021 ? ?CHIEF COMPLAINT: f/u of metastatic rectal cancer, h/o colon cancer ? ?CURRENT THERAPY:  ?Maintenance irinotecan and bevacizumab, q2weeks, from 01/24/21 ? ?ASSESSMENT & PLAN:  ?Brittney Spencer is a 71 y.o. female with  ? ?1. Rectal adenocarcinoma, with liver and right pelvic metastasis, recurrence from previous colon cancer vs new primary   ?-Diagnosed on routine screening colonoscopy 05/2020, rectal mass biopsy showed invasive adenocarcinoma, arising from a tubular adenoma ?-Her 07/12/20 PET showed positive uptake of known rectal tumor and soft tissue mass within the right iliac fossa indicating recurrent prior colon cancer. There is also left lobe of liver lesion is FDG avid concerning for liver metastasis. pt declined liver biopsy ?-whether this is metastatic rectal or recurrent/metastatic colon cancer, this is not curable but still treatable. She previously declined referral for [facility]IPEC ?-She began first-line chemo with dose-reduced FOLFIRI on 08/06/20. ?-Bevacizumab added and irinotecan/5FU increased with cycle 2, but due to poor tolerance/diarrhea irinotecan was reduced with cycle 3, tolerated better ?-She changed to maintenance therapy with irinotecan and bevacizumab every 2 weeks on 01/24/21 ?-Restaging CT CAP 08/13/21 showed continued good response to treatment, previous liver and right pelvic metastasis are not visible on current scan, and no new metastatic disease. We will continue maintenance therapy.  ?-labs reviewed, overall stable. Urine protein has remained negative, so we are now monitoring every other treatment now. Her  weight has been stable and over 100 since 04/25/21. She is tolerating treatment very well. ?-Her tumor marker CEA went up to 7 2 weeks ago, we will continue monitoring closely ?-Next restaging CT scan in 4 weeks ?  ?2. Cancer of ascending colon, pT4bN1bM0, stage IIIC, MSI-stable, (+) surgical margins at pelvic wall     ?-She was diagnosed in 12/2015. She is s/p right hemicolectomy with right salpingo oophorectomy and adjuvant ChemoRT. ?-Her 04/2019 CT scan was NED  ?-With recent rectal cancer diagnosis, her PET from 07/12/20 indicates soft tissue mass within the right iliac fossa is noted and consistent with local tumor recurrence from previous ascending colon tumor. ?  ?3. Mild Anemia ?-Has been stable and mild for the past year.  ?-worse lately due to rectal cancer and starting chemo ?-denies bleeding ?-monitoring, stable in 9-range. ?  ?4. HTN, Anxiety ?-She'll follow-up with her primary care physician and continue medication ?-She uses half tab Xanax as needed, mostly only on treatment days. ?-stable ?  ?  ?PLAN: ?-proceed with irinotecan/beva today at same dose  ?            -[name] injection on day 3  ?-lab, flush, and next irinotecan/beva 2, 4, and 6 weeks ?-f/u in 4 and 6 weeks ?-CT CAP to be done around 6/12 ? ? ?No problem-specific Assessment & Plan notes found for this encounter. ? ? ?SUMMARY OF ONCOLOGIC HISTORY: ?Oncology History Overview Note  ?Cancer Staging ?Cancer of ascending colon ([place]) ?Staging form: Colon and Rectum, AJCC 7th Edition ?- Clinical stage from 02/07/2016: Stage IIIC (T4b, N1b, M0) - Signed by [name], MD on 03/04/2016 ?Laterality: Right ?Residual tumor (R): R2 - Macroscopic ? ?  ?Cancer of ascending colon (Brittney Spencer)  ?10/25/2015 Imaging  ?  CT ABD/PELVIS:  Inflammatory changes inferior to the cecal ?tip appear improved, there is still irregular soft tissue thickening ?of the cecal tip, and there are adjacent prominent lymph nodes in ?the ileocolonic mesentery, measuring 13 mm on image 49 and  8 mm on ?image 52. In addition, there is a 2.5 x 1.8 cm nodule on image 46 ?which has central low density. Therefore, these findings are ?moderately suspicious for an underlying cecal malignancy with ?perforation.  ? ?  ?01/19/2016 Procedure  ? COLONOSCOPY per Dr. [name]: Fungating, ulcerated mass almost obstructing mid ascending colon ? ?  ?01/19/2016 Initial Biopsy  ? Diagnosis ?Surgical [P], cecal mass ?- INVASIVE ADENOCARCINOMA WITH ULCERATION. ?- SEE COMMENT. ? ?  ?02/05/2016 Tumor Marker  ? Patient's tumor was tested for the following markers: CEA ?Results of the tumor marker test revealed 5.7. ? ?  ?02/07/2016 Initial Diagnosis  ? Cancer of ascending colon (Brittney Spencer) ? ?  ?02/07/2016 Definitive Surgery  ? Laparoscopic assisted right hemicolectomy and right salpingo oopherectomy--Dr. [name] ? ?  ?02/07/2016 Pathologic Stage  ? p T4 N1b   ?2/43 nodes + ? ?  ?02/07/2016 Pathology Results  ? MMR normal; G2 adenocarcinoma;proximal & distal margins negative; soft tissue mass on pelvic sidewall + for adenocarcinoma with positive margin ?MSI Stable ? ?  ?03/08/2016 Imaging  ? CT chest negative for metastasis.  ? ?  ?03/19/2016 - 04/25/2016 Radiation Therapy  ? Adjuvant irradiation, 50 gray in 28 fractions ? ?  ?03/19/2016 - 04/22/2016 Chemotherapy  ? Xeloda 1500 mg twice daily, started on 03/19/2016, dose reduced to 1000 mg twice daily from week 3 due to neutropenia, and patient stopped 3 days before last dose radiation due to difficulty swallowing the pill ? ?  ?05/20/2016 -  Adjuvant Chemotherapy  ? Patient declined adjuvant chemotherapy ? ?  ?09/16/2016 Imaging  ? CT CAP w Contrast ?1. No evidence of local tumor recurrence at the ileocolic ?anastomosis. ?2. No findings suspicious for metastatic disease in the chest, ?abdomen or pelvis. ?3. Nonspecific trace free fluid in the pelvic cul-de-sac. ?4. Stable solitary 3 mm right upper lobe pulmonary nodule, for which ?6 month stability has been demonstrated, probably benign. ?5. 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 ? ?  ?05/13/2017 Imaging  ? CT CAP W Contrast 05/13/17 ?IMPRESSION: ?1. No current findings of residual or recurrent malignancy. ?2. Mild prominence of stool throughout the colon. Nondistended ?portions of the rectum. ?3. Several tiny pulmonary nodules are stable from the earliest ?available comparison of 03/08/2016 and probably benign, but may ?merit surveillance. ?4. 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 ?Lumbar spondylosis and degenerative disc disease. Stable amount of ?trace free pelvic fluid. ?  ?04/27/2018 Imaging  ? 04/27/2018 CT CAP ?IMPRESSION: ?Stable exam. No evidence of recurrent or metastatic carcinoma within ?the chest, abdomen, or pelvis ?  ?04/19/2019 Imaging  ? CT CAP W Contrast  ?IMPRESSION: ?Chest Impression: ?  ?1. No evidence of thoracic metastasis. ?2. Stable small bilateral pulmonary nodules. ?  ?Abdomen / Pelvis Impression: ?  ?1. No evidence local colorectal carcinoma recurrence or metastasis ?in the abdomen pelvis. ?2. Post RIGHT hemicolectomy. ?  ?01/22/2021 Imaging  ? CT CAP ? ?IMPRESSION: ?CT CHEST IMPRESSION ?  ?1. Similar nonspecific pulmonary nodules. ?2. New posterior left upper lobe reticulonodular opacity, suspicious ?for interval mild infection or inflammation. ?3. No thoracic adenopathy. ?  ?CT ABDOMEN AND PELVIS IMPRESSION ?  ?1. Further  decrease in size of high left hepatic lobe 3 mm ?low-density lesion. No new or progressive metastatic disease within the abdomen or pelvis. ?2. Similar trace free pelvic fluid. ?3. Similar nonspecific mid rectal wall thickening. ?4.  Aortic Atherosclerosis (ICD10-I70.0). ?  ?04/23/2021 Imaging  ? CT CAP ? ?IMPRESSION: ?1. Treated metastatic lesion between segments 2 and 3 of the liver, ?slightly smaller and less distinct than prior examination. No  other ?signs of definite metastatic disease elsewhere in the abdomen or ?pelvis. ?2. Multiple small pulmonary nodules, stable compared to the prior ?examination, favored to be benign. No definitive findings to

## 2021-11-01 NOTE — Patient Instructions (Signed)
[facility]  Discharge Instructions: ?Thank you for choosing [facility] to provide your oncology and hematology care.  ? ?If you have a lab appointment with the [facility], please go directly to the [facility] and check in at the registration area. ?  ?Wear comfortable clothing and clothing appropriate for easy access to any Portacath or PICC line.  ? ?We strive to give you quality time with your provider. You may need to reschedule your appointment if you arrive late (15 or more minutes).  Arriving late affects you and other patients whose appointments are after yours.  Also, if you miss three or more appointments without notifying the office, you may be dismissed from the clinic at the provider?s discretion.    ?  ?For prescription refill requests, have your pharmacy contact our office and allow 72 hours for refills to be completed.   ? ?Today you received the following chemotherapy and/or immunotherapy agents: Bevacizumab, Irinotecan.     ?  ?To help prevent nausea and vomiting after your treatment, we encourage you to take your nausea medication as directed. ? ?BELOW ARE SYMPTOMS THAT SHOULD BE REPORTED IMMEDIATELY: ?*FEVER GREATER THAN 100.4 F (38 ?C) OR HIGHER ?*CHILLS OR SWEATING ?*NAUSEA AND VOMITING THAT IS NOT CONTROLLED WITH YOUR NAUSEA MEDICATION ?*UNUSUAL SHORTNESS OF BREATH ?*UNUSUAL BRUISING OR BLEEDING ?*URINARY PROBLEMS (pain or burning when urinating, or frequent urination) ?*BOWEL PROBLEMS (unusual diarrhea, constipation, pain near the anus) ?TENDERNESS IN MOUTH AND THROAT WITH OR WITHOUT PRESENCE OF ULCERS (sore throat, sores in mouth, or a toothache) ?UNUSUAL RASH, SWELLING OR PAIN  ?UNUSUAL VAGINAL DISCHARGE OR ITCHING  ? ?Items with * indicate a potential emergency and should be followed up as soon as possible or go to the Emergency Department if any problems should occur. ? ?Please show the CHEMOTHERAPY ALERT CARD or IMMUNOTHERAPY ALERT CARD  at check-in to the Emergency Department and triage nurse. ? ?Should you have questions after your visit or need to cancel or reschedule your appointment, please contact [facility]  Dept: [phone]  and follow the prompts.  Office hours are 8:00 a.m. to 4:30 p.m. Monday - Friday. Please note that voicemails left after 4:00 p.m. may not be returned until the following business day.  We are closed weekends and major holidays. You have access to a nurse at all times for urgent questions. Please call the main number to the clinic Dept: [phone] and follow the prompts. ? ? ?For any non-urgent questions, you may also contact your provider using MyChart. We now offer e-Visits for anyone 72 and older to request care online for non-urgent symptoms. For details visit [email or web address]. ?  ?Also download the MyChart app! Go to the app store, search "MyChart", open the app, select [facility], and log in with your MyChart username and password. ? ?Due to Covid, a mask is required upon entering the hospital/clinic. If you do not have a mask, one will be given to you upon arrival. For doctor visits, patients may have 1 support person aged 31 or older with them. For treatment visits, patients cannot have anyone with them due to current Covid guidelines and our immunocompromised population.  ? ?

## 2021-11-03 ENCOUNTER — Encounter: Payer: Self-pay | Admitting: Hematology

## 2021-11-03 ENCOUNTER — Inpatient Hospital Stay: Payer: Federal, State, Local not specified - PPO

## 2021-11-03 ENCOUNTER — Other Ambulatory Visit: Payer: Self-pay

## 2021-11-03 VITALS — BP 136/74 | HR 91 | Temp 99.1°F | Resp 17

## 2021-11-03 DIAGNOSIS — Z5112 Encounter for antineoplastic immunotherapy: Secondary | ICD-10-CM | POA: Diagnosis not present

## 2021-11-03 DIAGNOSIS — C20 Malignant neoplasm of rectum: Secondary | ICD-10-CM

## 2021-11-03 MED ORDER — PEGFILGRASTIM-CBQV 6 MG/0.6ML ~~LOC~~ SOSY
6.0000 mg | PREFILLED_SYRINGE | Freq: Once | SUBCUTANEOUS | Status: AC
  Administered 2021-11-03: 6 mg via SUBCUTANEOUS
  Filled 2021-11-03: qty 0.6

## 2021-11-08 ENCOUNTER — Telehealth: Payer: Self-pay | Admitting: Hematology

## 2021-11-08 NOTE — Telephone Encounter (Signed)
Scheduled follow-up appointment per 5/4 los. Patient is aware. ?

## 2021-11-13 MED FILL — Dexamethasone Sodium Phosphate Inj 100 MG/10ML: INTRAMUSCULAR | Qty: 1 | Status: AC

## 2021-11-14 ENCOUNTER — Inpatient Hospital Stay: Payer: Federal, State, Local not specified - PPO

## 2021-11-14 ENCOUNTER — Other Ambulatory Visit: Payer: Self-pay

## 2021-11-15 ENCOUNTER — Other Ambulatory Visit: Payer: Self-pay

## 2021-11-15 ENCOUNTER — Inpatient Hospital Stay: Payer: Federal, State, Local not specified - PPO

## 2021-11-15 VITALS — BP 139/84 | HR 73 | Temp 98.4°F | Resp 18 | Wt 112.0 lb

## 2021-11-15 DIAGNOSIS — C20 Malignant neoplasm of rectum: Secondary | ICD-10-CM

## 2021-11-15 DIAGNOSIS — Z5112 Encounter for antineoplastic immunotherapy: Secondary | ICD-10-CM | POA: Diagnosis not present

## 2021-11-15 DIAGNOSIS — Z95828 Presence of other vascular implants and grafts: Secondary | ICD-10-CM

## 2021-11-15 LAB — CBC WITH DIFFERENTIAL (CANCER CENTER ONLY)
Abs Immature Granulocytes: 0.02 10*3/uL (ref 0.00–0.07)
Basophils Absolute: 0 10*3/uL (ref 0.0–0.1)
Basophils Relative: 0 %
Eosinophils Absolute: 0.1 10*3/uL (ref 0.0–0.5)
Eosinophils Relative: 2 %
HCT: 31.8 % — ABNORMAL LOW (ref 36.0–46.0)
Hemoglobin: 9.7 g/dL — ABNORMAL LOW (ref 12.0–15.0)
Immature Granulocytes: 0 %
Lymphocytes Relative: 16 %
Lymphs Abs: 1 10*3/uL (ref 0.7–4.0)
MCH: 25.5 pg — ABNORMAL LOW (ref 26.0–34.0)
MCHC: 30.5 g/dL (ref 30.0–36.0)
MCV: 83.5 fL (ref 80.0–100.0)
Monocytes Absolute: 0.5 10*3/uL (ref 0.1–1.0)
Monocytes Relative: 8 %
Neutro Abs: 4.3 10*3/uL (ref 1.7–7.7)
Neutrophils Relative %: 74 %
Platelet Count: 227 10*3/uL (ref 150–400)
RBC: 3.81 MIL/uL — ABNORMAL LOW (ref 3.87–5.11)
RDW: 17.2 % — ABNORMAL HIGH (ref 11.5–15.5)
WBC Count: 5.9 10*3/uL (ref 4.0–10.5)
nRBC: 0 % (ref 0.0–0.2)

## 2021-11-15 LAB — CMP (CANCER CENTER ONLY)
ALT: 6 U/L (ref 0–44)
AST: 13 U/L — ABNORMAL LOW (ref 15–41)
Albumin: 3.7 g/dL (ref 3.5–5.0)
Alkaline Phosphatase: 84 U/L (ref 38–126)
Anion gap: 6 (ref 5–15)
BUN: 11 mg/dL (ref 8–23)
CO2: 26 mmol/L (ref 22–32)
Calcium: 8.7 mg/dL — ABNORMAL LOW (ref 8.9–10.3)
Chloride: 106 mmol/L (ref 98–111)
Creatinine: 0.72 mg/dL (ref 0.44–1.00)
GFR, Estimated: >60 mL/min (ref >60)
Glucose, Bld: 89 mg/dL (ref 70–99)
Potassium: 4.1 mmol/L (ref 3.5–5.1)
Sodium: 138 mmol/L (ref 135–145)
Total Bilirubin: 0.2 mg/dL — ABNORMAL LOW (ref 0.3–1.2)
Total Protein: 6.8 g/dL (ref 6.5–8.1)

## 2021-11-15 LAB — CEA (IN HOUSE-CHCC): CEA (CHCC-In House): 9.22 ng/mL — ABNORMAL HIGH (ref 0.00–5.00)

## 2021-11-15 LAB — TOTAL PROTEIN, URINE DIPSTICK: Protein, ur: NEGATIVE mg/dL

## 2021-11-15 MED ORDER — SODIUM CHLORIDE 0.9 % IV SOLN: Freq: Once | INTRAVENOUS | Status: AC

## 2021-11-15 MED ORDER — PALONOSETRON HCL INJECTION 0.25 MG/5ML
0.2500 mg | Freq: Once | INTRAVENOUS | Status: AC
  Administered 2021-11-15: 0.25 mg via INTRAVENOUS
  Filled 2021-11-15: qty 5

## 2021-11-15 MED ORDER — SODIUM CHLORIDE 0.9 % IV SOLN: Freq: Once | INTRAVENOUS | Status: DC

## 2021-11-15 MED ORDER — HEPARIN SOD (PORK) LOCK FLUSH 100 UNIT/ML IV SOLN
500.0000 [IU] | Freq: Once | INTRAVENOUS | Status: AC | PRN
  Administered 2021-11-15: 500 [IU]

## 2021-11-15 MED ORDER — SODIUM CHLORIDE 0.9% FLUSH
10.0000 mL | INTRAVENOUS | Status: DC | PRN
  Administered 2021-11-15: 10 mL

## 2021-11-15 MED ORDER — SODIUM CHLORIDE 0.9 % IV SOLN
200.0000 mg | Freq: Once | INTRAVENOUS | Status: AC
  Administered 2021-11-15: 200 mg via INTRAVENOUS
  Filled 2021-11-15: qty 10

## 2021-11-15 MED ORDER — SODIUM CHLORIDE 0.9 % IV SOLN
5.2000 mg/kg | Freq: Once | INTRAVENOUS | Status: AC
  Administered 2021-11-15: 250 mg via INTRAVENOUS
  Filled 2021-11-15: qty 10

## 2021-11-15 MED ORDER — ATROPINE SULFATE 1 MG/ML IV SOLN
0.5000 mg | Freq: Once | INTRAVENOUS | Status: AC | PRN
  Administered 2021-11-15: 0.5 mg via INTRAVENOUS
  Filled 2021-11-15: qty 1

## 2021-11-15 MED ORDER — SODIUM CHLORIDE 0.9% FLUSH
10.0000 mL | Freq: Once | INTRAVENOUS | Status: AC
  Administered 2021-11-15: 10 mL

## 2021-11-15 MED ORDER — SODIUM CHLORIDE 0.9 % IV SOLN
10.0000 mg | Freq: Once | INTRAVENOUS | Status: AC
  Administered 2021-11-15: 10 mg via INTRAVENOUS
  Filled 2021-11-15: qty 10

## 2021-11-15 NOTE — Patient Instructions (Signed)
[facility]  Discharge Instructions: Thank you for choosing [facility] to provide your oncology and hematology care.   If you have a lab appointment with the [facility], please go directly to the [facility] and check in at the registration area.   Wear comfortable clothing and clothing appropriate for easy access to any Portacath or PICC line.   We strive to give you quality time with your provider. You may need to reschedule your appointment if you arrive late (15 or more minutes).  Arriving late affects you and other patients whose appointments are after yours.  Also, if you miss three or more appointments without notifying the office, you may be dismissed from the clinic at the provider's discretion.      For prescription refill requests, have your pharmacy contact our office and allow 72 hours for refills to be completed.    Today you received the following chemotherapy and/or immunotherapy agents: Bevacizumab, Irinotecan.       To help prevent nausea and vomiting after your treatment, we encourage you to take your nausea medication as directed.  BELOW ARE SYMPTOMS THAT SHOULD BE REPORTED IMMEDIATELY: *FEVER GREATER THAN 100.4 F (38 C) OR HIGHER *CHILLS OR SWEATING *NAUSEA AND VOMITING THAT IS NOT CONTROLLED WITH YOUR NAUSEA MEDICATION *UNUSUAL SHORTNESS OF BREATH *UNUSUAL BRUISING OR BLEEDING *URINARY PROBLEMS (pain or burning when urinating, or frequent urination) *BOWEL PROBLEMS (unusual diarrhea, constipation, pain near the anus) TENDERNESS IN MOUTH AND THROAT WITH OR WITHOUT PRESENCE OF ULCERS (sore throat, sores in mouth, or a toothache) UNUSUAL RASH, SWELLING OR PAIN  UNUSUAL VAGINAL DISCHARGE OR ITCHING   Items with * indicate a potential emergency and should be followed up as soon as possible or go to the Emergency Department if any problems should occur.  Please show the CHEMOTHERAPY ALERT CARD or IMMUNOTHERAPY ALERT CARD  at check-in to the Emergency Department and triage nurse.  Should you have questions after your visit or need to cancel or reschedule your appointment, please contact [facility]  Dept: [phone]  and follow the prompts.  Office hours are 8:00 a.m. to 4:30 p.m. Monday - Friday. Please note that voicemails left after 4:00 p.m. may not be returned until the following business day.  We are closed weekends and major holidays. You have access to a nurse at all times for urgent questions. Please call the main number to the clinic Dept: [phone] and follow the prompts.   For any non-urgent questions, you may also contact your provider using MyChart. We now offer e-Visits for anyone 18 and older to request care online for non-urgent symptoms. For details visit [email or web address].   Also download the MyChart app! Go to the app store, search "MyChart", open the app, select [facility], and log in with your MyChart username and password.  Due to Covid, a mask is required upon entering the hospital/clinic. If you do not have a mask, one will be given to you upon arrival. For doctor visits, patients may have 1 support person aged 60 or older with them. For treatment visits, patients cannot have anyone with them due to current Covid guidelines and our immunocompromised population.

## 2021-11-16 ENCOUNTER — Inpatient Hospital Stay: Payer: Federal, State, Local not specified - PPO

## 2021-11-17 ENCOUNTER — Other Ambulatory Visit: Payer: Self-pay

## 2021-11-17 ENCOUNTER — Inpatient Hospital Stay: Payer: Federal, State, Local not specified - PPO

## 2021-11-17 VITALS — BP 129/77 | HR 97 | Temp 97.8°F | Resp 16

## 2021-11-17 DIAGNOSIS — Z5112 Encounter for antineoplastic immunotherapy: Secondary | ICD-10-CM | POA: Diagnosis not present

## 2021-11-17 DIAGNOSIS — C20 Malignant neoplasm of rectum: Secondary | ICD-10-CM

## 2021-11-17 MED ORDER — PEGFILGRASTIM-CBQV 6 MG/0.6ML ~~LOC~~ SOSY
6.0000 mg | PREFILLED_SYRINGE | Freq: Once | SUBCUTANEOUS | Status: AC
  Administered 2021-11-17: 6 mg via SUBCUTANEOUS
  Filled 2021-11-17: qty 0.6

## 2021-11-23 NOTE — Progress Notes (Unsigned)
[place] OFFICE PROGRESS NOTE  Patient, No Pcp Per (Inactive) No address on file  DIAGNOSIS: f/u of metastatic rectal cancer, h/o colon cancer  Oncology History Overview Note  Cancer Staging Cancer of ascending colon [facility]) Staging form: Colon and Rectum, AJCC 7th Edition - Clinical stage from 02/07/2016: Stage IIIC (T4b, N1b, M0) - Signed by [name], MD on 03/04/2016 Laterality: Right Residual tumor (R): R2 - Macroscopic    Cancer of ascending colon ([place])  10/25/2015 Imaging   CT ABD/PELVIS:  Inflammatory changes inferior to the cecal tip appear improved, there is still irregular soft tissue thickening of the cecal tip, and there are adjacent prominent lymph nodes in the ileocolonic mesentery, measuring 13 mm on image 49 and 8 mm on image 52. In addition, there is a 2.5 x 1.8 cm nodule on image 46 which has central low density. Therefore, these findings are moderately suspicious for an underlying cecal malignancy with perforation.     01/19/2016 Procedure   COLONOSCOPY per Dr. [name]: Fungating, ulcerated mass almost obstructing mid ascending colon    01/19/2016 Initial Biopsy   Diagnosis Surgical [P], cecal mass - INVASIVE ADENOCARCINOMA WITH ULCERATION. - SEE COMMENT.    02/05/2016 Tumor Marker   Patient's tumor was tested for the following markers: CEA Results of the tumor marker test revealed 5.7.    02/07/2016 Initial Diagnosis   Cancer of ascending colon ([place])    02/07/2016 Definitive Surgery   Laparoscopic assisted right hemicolectomy and right salpingo oopherectomy--Dr. [name]    02/07/2016 Pathologic Stage   p T4 N1b   2/43 nodes +    02/07/2016 Pathology Results   MMR normal; G2 adenocarcinoma;proximal & distal margins negative; soft tissue mass on pelvic sidewall + for adenocarcinoma with positive margin MSI Stable    03/08/2016 Imaging   CT chest negative for metastasis.     03/19/2016 - 04/25/2016 Radiation Therapy   Adjuvant  irradiation, 50 gray in 28 fractions    03/19/2016 - 04/22/2016 Chemotherapy   Xeloda 1500 mg twice daily, started on 03/19/2016, dose reduced to 1000 mg twice daily from week 3 due to neutropenia, and patient stopped 3 days before last dose radiation due to difficulty swallowing the pill    05/20/2016 -  Adjuvant Chemotherapy   Patient declined adjuvant chemotherapy    09/16/2016 Imaging   CT CAP w Contrast 1. No evidence of local tumor recurrence at the ileocolic anastomosis. 2. No findings suspicious for metastatic disease in the chest, abdomen or pelvis. 3. Nonspecific trace free fluid in the pelvic cul-de-sac. 4. Stable solitary 3 mm right upper lobe pulmonary nodule, for which 6 month stability has been demonstrated, probably benign. 5. Additional findings include stable right posterior pericardial cyst and small calcified uterine fibroids.    05/13/2017 Imaging   CT CAP W Contrast 05/13/17 IMPRESSION: 1. No current findings of residual or recurrent malignancy. 2. Mild prominence of stool throughout the colon. Nondistended portions of the rectum. 3. Several tiny pulmonary nodules are stable from the earliest available comparison of 03/08/2016 and probably benign, but may merit surveillance. 4. Other imaging findings of potential clinical significance: Old granulomatous disease. Aortoiliac atherosclerotic vascular disease. Lumbar spondylosis and degenerative disc disease. Stable amount of trace free pelvic fluid.   04/27/2018 Imaging   04/27/2018 CT CAP IMPRESSION: Stable exam. No evidence of recurrent or metastatic carcinoma within the chest, abdomen, or pelvis   04/19/2019 Imaging   CT CAP W Contrast  IMPRESSION: Chest Impression:   1.  No evidence of thoracic metastasis. 2. Stable small bilateral pulmonary nodules.   Abdomen / Pelvis Impression:   1. No evidence local colorectal carcinoma recurrence or metastasis in the abdomen pelvis. 2. Post RIGHT  hemicolectomy.   01/22/2021 Imaging   CT CAP  IMPRESSION: CT CHEST IMPRESSION   1. Similar nonspecific pulmonary nodules. 2. New posterior left upper lobe reticulonodular opacity, suspicious for interval mild infection or inflammation. 3. No thoracic adenopathy.   CT ABDOMEN AND PELVIS IMPRESSION   1. Further decrease in size of high left hepatic lobe 3 mm low-density lesion. No new or progressive metastatic disease within the abdomen or pelvis. 2. Similar trace free pelvic fluid. 3. Similar nonspecific mid rectal wall thickening. 4.  Aortic Atherosclerosis (ICD10-I70.0).   04/23/2021 Imaging   CT CAP  IMPRESSION: 1. Treated metastatic lesion between segments 2 and 3 of the liver, slightly smaller and less distinct than prior examination. No other signs of definite metastatic disease elsewhere in the abdomen or pelvis. 2. Multiple small pulmonary nodules, stable compared to the prior examination, favored to be benign. No definitive findings to suggest metastatic disease to the thorax. 3. Aortic atherosclerosis. 4. Additional incidental findings, as above.   08/13/2021 Imaging   EXAM: CT CHEST, ABDOMEN, AND PELVIS WITH CONTRAST  IMPRESSION: 1. A previously seen PET avid lesion of the anterior left lobe of the liver, hepatic segment II, is no longer discretely appreciable consistent with treatment response of a hepatic metastasis. 2. No evidence of new metastatic disease in the chest, abdomen, or pelvis. 3. Interval increase in a small focus of consolidation and nodularity of the medial left upper lobe, consistent with minimal, ongoing atypical infection. Additional tiny bilateral pulmonary nodules are stable and almost certainly incidental benign. Attention on follow-up. 4. Status post right hemicolectomy and ileocolic anastomosis.   Rectal cancer metastasized to liver ([place])  06/15/2020 Procedure   Screening Colonoscopy by Dr [name]  IMPRESSION - Decreased sphincter  tone and internal hemorrhoids that prolapse with straining, but require manual replacement into the anal canal (Grade III) found on digital rectal exam. - Patent side-to-side ileo-colonic anastomosis, characterized by healthy appearing mucosa. - The examined portion of the ileum was normal. - One diminutive polyp in the proximal transverse colon, removed with a cold biopsy forceps. Resected and retrieved. - Likely malignant partially obstructing tumor in the mid rectum. Biopsied. Tattooed. - The examination was otherwise normal on direct and retroflexion views.   06/15/2020 Initial Biopsy   Diagnosis 1. Transverse Colon Polyp - HYPERPLASTIC POLYP 2. Rectum, biopsy - ADENOCARCINOMA ARISING IN A TUBULAR ADENOMA WITH HIGH-GRADE DYSPLASIA. SEE NOTE Diagnosis Note 2. Dr. [name] reviewed the case and concurs with the diagnosis. Dr. [name] was notified on 06/16/2020.   06/28/2020 Imaging   CT CAP  IMPRESSION: 1. New low-density focus in the anterior aspect of the lateral segment LEFT hepatic lobe measuring 1.2 x 1.0 cm, compatible with small metastatic lesion in the LEFT hepatic lobe. 2. Soft tissue in the RIGHT iliac fossa following RIGHT hemicolectomy invades the psoas musculature and is slowly enlarging over time, more linear on the prior study now highly concerning for recurrence/metastasis to this location. 3. Signs of enteritis, potentially post radiation changes of the small bowel. Tethered small bowel in the RIGHT lower quadrant shows focal thickening and narrowing suspicious for small bowel involvement and developing partial obstruction though currently contrast passes beyond this point into the colon. 4. Rectal thickening in this patient with known rectal mass as described. 5. No  evidence of metastatic disease in the chest. 6. Stable small pulmonary nodules. 7.  and aortic atherosclerosis.   Aortic Atherosclerosis (ICD10-I70.0) and Emphysema (ICD10-J43.9).   07/04/2020  Initial Diagnosis   Rectal cancer metastasized to liver ([place])   07/12/2020 PET scan   IMPRESSION: 1. Exam positive for FDG avid rectal tumor which corresponds to the recent colonoscopy findings. 2. FDG avid soft tissue mass within the right iliac fossa is noted and consistent with local tumor recurrence from previous ascending colon tumor. 3. Lateral segment left lobe of liver lesion is FDG avid concerning for liver metastasis. 4. No specific findings identified to suggest metastatic disease to the chest.   08/10/2020 -  Chemotherapy   First-line FOLFIRI q2weeks starting 08/10/20. dose reduced with cycle 1. Irinotecan/5FU increased and Bevacizumab added with cycle 2 on 08/23/2020    10/27/2020 Imaging   CT CAP  IMPRESSION: 1. Interval decrease in size of the hypermetabolic left hepatic lesion, consistent with metastatic disease. No new liver lesion evident. 2. Interval resolution of the hypermetabolic soft tissue lesion along the right iliac fossa with no measurable soft tissue lesion remaining at this location today. 3. Similar appearance of soft tissue fullness in the rectum at the site of the hypermetabolic lesion seen previously. 4. Stable tiny bilateral pulmonary nodules. Continued attention on follow-up recommended. 5. Small volume free fluid in the pelvis. 6. Aortic Atherosclerosis (ICD10-I70.0).   01/22/2021 Imaging   CT CAP  IMPRESSION: CT CHEST IMPRESSION   1. Similar nonspecific pulmonary nodules. 2. New posterior left upper lobe reticulonodular opacity, suspicious for interval mild infection or inflammation. 3. No thoracic adenopathy.   CT ABDOMEN AND PELVIS IMPRESSION   1. Further decrease in size of high left hepatic lobe 3 mm low-density lesion. No new or progressive metastatic disease within the abdomen or pelvis. 2. Similar trace free pelvic fluid. 3. Similar nonspecific mid rectal wall thickening. 4.  Aortic Atherosclerosis (ICD10-I70.0).   04/23/2021  Imaging   CT CAP  IMPRESSION: 1. Treated metastatic lesion between segments 2 and 3 of the liver, slightly smaller and less distinct than prior examination. No other signs of definite metastatic disease elsewhere in the abdomen or pelvis. 2. Multiple small pulmonary nodules, stable compared to the prior examination, favored to be benign. No definitive findings to suggest metastatic disease to the thorax. 3. Aortic atherosclerosis. 4. Additional incidental findings, as above.   08/13/2021 Imaging   EXAM: CT CHEST, ABDOMEN, AND PELVIS WITH CONTRAST  IMPRESSION: 1. A previously seen PET avid lesion of the anterior left lobe of the liver, hepatic segment II, is no longer discretely appreciable consistent with treatment response of a hepatic metastasis. 2. No evidence of new metastatic disease in the chest, abdomen, or pelvis. 3. Interval increase in a small focus of consolidation and nodularity of the medial left upper lobe, consistent with minimal, ongoing atypical infection. Additional tiny bilateral pulmonary nodules are stable and almost certainly incidental benign. Attention on follow-up. 4. Status post right hemicolectomy and ileocolic anastomosis.     CURRENT THERAPY: Maintenance irinotecan and bevacizumab, q2weeks, from 01/24/21.   INTERVAL HISTORY: Brittney Spencer 71 y.o. female returns to the clinic today for a follow-up visit.  The patient is feeling fairly well today without any new concerning complaints.  She is currently undergoing treatment with Avastin and irinotecan and is tolerating this fairly well.  She is expected to undergo a restaging CT scan on 12/10/2021 as she recently had some increase in her CEA a few weeks ago  which are being monitored closely.  Otherwise she denies any fever, chills, night sweats, or unexplained weight loss.  Her appetite is fair. She mentions she has had a stressful family situation which she believes affected her appetite. She denies  any chest pain, cough, shortness of breath, or hemoptysis.  She sometimes has mild nausea after chemo but nothing significant enough to warrant taking an anti-emetic.  Denies any vomiting, diarrhea, or recent constipation.  Denies any abdominal pain.  Denies any blood in the stool.  Denies any changes with her urinary habits.  Denies any pain.  She is here today for evaluation and repeat blood work before undergoing cycle #32 for treatment.      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 Some residual intraretinal fluid remains, very small localized subfoveal of fluid remains although this continues to slowly resorb. We'll continue to observe.   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   Resolved March 2021 post vitrectomy    ALLERGIES:  is allergic to fish allergy, peanut-containing drug products, soy allergy, and buspirone.  MEDICATIONS:  Current Outpatient Medications  Medication Sig Dispense Refill   ALPRAZolam (XANAX) 0.25 MG tablet Take 1 tablet (0.25 mg total) by mouth daily as needed for anxiety. 30 tablet 0   Cholecalciferol (VITAMIN D3 PO) Take by mouth daily.     diphenoxylate-atropine (LOMOTIL) 2.5-0.025 MG tablet Take 2 tablets by mouth 4 (four) times daily as needed for diarrhea or loose stools. 45 tablet 3   docusate sodium (COLACE) 100 MG capsule 1 capsule as needed     lidocaine-prilocaine  (EMLA) cream Apply 1 application topically as needed. 30 g 1   NORVASC 2.5 MG tablet TAKE 1 TABLET BY MOUTH EVERY DAY 90 tablet 1   ondansetron (ZOFRAN) 8 MG tablet Take 1 tablet (8 mg total) by mouth every 8 (eight) hours as needed for nausea or vomiting. 20 tablet 2   Current Facility-Administered Medications  Medication Dose Route Frequency Provider Last Rate Last Admin   0.9 %  sodium chloride infusion  500 mL Intravenous Once Doran Stabler, MD        SURGICAL HISTORY:  Past Surgical History:  Procedure Laterality Date   COLONOSCOPY  2018   [facility]   COLONSCOPY  12/2015   IR IMAGING GUIDED PORT INSERTION  08/04/2020   LAPAROSCOPIC RIGHT HEMI COLECTOMY Right 02/07/2016   Procedure: LAPAROSCOPIC ASSISTED RIGHT HEMI COLECTOMY AND RIGHT SALPINGO OOPHERECTOMY;  Surgeon: [name], MD;  Location: [facility];  Service: General;  Laterality: Right;   RETINAL DETACHMENT SURGERY  09/2019    REVIEW OF SYSTEMS:   Review of Systems  Constitutional: Negative for appetite change, chills, fatigue, fever and unexpected weight change.  HENT: Negative for mouth sores, nosebleeds, sore throat and trouble swallowing.   Eyes: Negative for eye problems  and icterus.  Respiratory: Negative for cough, hemoptysis, shortness of breath and wheezing.   Cardiovascular: Negative for chest pain and leg swelling.  Gastrointestinal: Negative for abdominal pain, constipation, diarrhea, nausea and vomiting.  Genitourinary: Negative for bladder incontinence, difficulty urinating, dysuria, frequency and hematuria.   Musculoskeletal: Negative for back pain, gait problem, neck pain and neck stiffness.  Skin: Negative for itching and rash.  Neurological: Negative for dizziness, extremity weakness, gait problem, headaches, light-headedness and seizures.  Hematological: Negative for adenopathy. Does not bruise/bleed easily.  Psychiatric/Behavioral: Negative for confusion, depression and sleep disturbance. The patient  is not nervous/anxious.     PHYSICAL EXAMINATION:  Blood pressure 131/76, pulse 60, temperature 97.8 F (36.6 C), temperature source Temporal, resp. rate 17, height 5' 6" (1.676 m), SpO2 97 %.  ECOG PERFORMANCE STATUS: 1  Physical Exam  Constitutional: Oriented to person, place, and time and well-developed, thin appearing female and in no distress. HENT:  Head: Normocephalic and atraumatic.  Mouth/Throat: Oropharynx is clear and moist. No oropharyngeal exudate.  Eyes: Conjunctivae are normal. Right eye exhibits no discharge. Left eye exhibits no discharge. No scleral icterus.  Neck: Normal range of motion. Neck supple.  Cardiovascular: Normal rate, regular rhythm, normal heart sounds and intact distal pulses.   Pulmonary/Chest: Effort normal and breath sounds normal. No respiratory distress. No wheezes. No rales.  Abdominal: Soft. Bowel sounds are normal. Exhibits no distension and no mass. There is no tenderness.  Musculoskeletal: Normal range of motion. Exhibits no edema.  Lymphadenopathy:    No cervical adenopathy.  Neurological: Alert and oriented to person, place, and time. Exhibits muscle wasting. Gait normal. Coordination normal.  Skin: Skin is warm and dry. No rash noted. Not diaphoretic. No erythema. No pallor.  Psychiatric: Mood, memory and judgment normal.  Vitals reviewed.  LABORATORY DATA: Lab Results  Component Value Date   WBC 6.4 11/29/2021   HGB 9.4 (L) 11/29/2021   HCT 30.6 (L) 11/29/2021   MCV 83.2 11/29/2021   PLT 182 11/29/2021      Chemistry      Component Value Date/Time   NA 139 11/29/2021 0824   NA 140 05/13/2017 1252   K 4.0 11/29/2021 0824   K 3.7 05/13/2017 1252   CL 107 11/29/2021 0824   CO2 27 11/29/2021 0824   CO2 28 05/13/2017 1252   BUN 10 11/29/2021 0824   BUN 12.5 05/13/2017 1252   CREATININE 0.65 11/29/2021 0824   CREATININE 0.9 05/13/2017 1252      Component Value Date/Time   CALCIUM 8.9 11/29/2021 0824   CALCIUM 9.4  05/13/2017 1252   ALKPHOS 80 11/29/2021 0824   ALKPHOS 63 05/13/2017 1252   AST 13 (L) 11/29/2021 0824   AST 19 05/13/2017 1252   ALT 6 11/29/2021 0824   ALT 10 05/13/2017 1252   BILITOT 0.3 11/29/2021 0824   BILITOT 0.47 05/13/2017 1252       RADIOGRAPHIC STUDIES:  No results found.   ASSESSMENT/PLAN:  Brittney Spencer is a 71 y.o. female with    1. Rectal adenocarcinoma, with liver and right pelvic metastasis, recurrence from previous colon cancer vs new primary   -Diagnosed on routine screening colonoscopy 05/2020, rectal mass biopsy showed invasive adenocarcinoma, arising from a tubular adenoma -Her 07/12/20 PET showed positive uptake of known rectal tumor and soft tissue mass within the right iliac fossa indicating recurrent prior colon cancer. There is also left lobe of liver lesion is FDG avid concerning for liver metastasis. pt declined liver  biopsy -whether this is metastatic rectal or recurrent/metastatic colon cancer, this is not curable but still treatable. She previously declined referral for HIPEC -She began first-line chemo with dose-reduced FOLFIRI on 08/06/20. -Bevacizumab added and irinotecan/5FU increased with cycle 2, but due to poor tolerance/diarrhea irinotecan was reduced with cycle 3, tolerated better -She changed to maintenance therapy with irinotecan and bevacizumab every 2 weeks on 01/24/21 -Restaging CT CAP 08/13/21 showed continued good response to treatment, previous liver and right pelvic metastasis are not visible on current scan, and no new metastatic disease. We will continue maintenance therapy.  -labs reviewed, overall stable. Urine protein has remained negative, so we are now monitoring every other treatment now. Her weight has been stable and over 100 since 04/25/21. She is tolerating treatment very well. -Her tumor marker CEA went up to 7 4 weeks ago and to 9.22 2 weeks ago, we will continue monitoring closely.  -Next restaging CT scan ~12/10/21. She  will see Dr. Burr Medico a few days later to review the results.    2. Cancer of ascending colon, pT4bN1bM0, stage IIIC, MSI-stable, (+) surgical margins at pelvic wall     -She was diagnosed in 12/2015. She is s/p right hemicolectomy with right salpingo oophorectomy and adjuvant ChemoRT. -Her 04/2019 CT scan was NED  -With recent rectal cancer diagnosis, her PET from 07/12/20 indicates soft tissue mass within the right iliac fossa is noted and consistent with local tumor recurrence from previous ascending colon tumor.   3. Mild Anemia -Has been stable and mild for the past year.  -worse lately due to rectal cancer and starting chemo -denies bleeding -monitoring, stable in 9-range.   4. HTN, Anxiety -She'll follow-up with her primary care physician and continue medication -She uses half tab Xanax as needed, mostly only on treatment days. -stable     PLAN: -proceed with irinotecan/beva today at same dose              -[name] injection on day 3  -lab, flush, and next irinotecan/beva 2, 4, and 6 weeks -f/u in 4 and 6 weeks -CT CAP to be done around 6/12    No orders of the defined types were placed in this encounter.     The total time spent in the appointment was 20-29 minutes.   Hildagarde Holleran L Eytan Carrigan, PA-C 11/29/21

## 2021-11-28 ENCOUNTER — Other Ambulatory Visit: Payer: Self-pay | Admitting: Hematology

## 2021-11-28 MED FILL — Dexamethasone Sodium Phosphate Inj 100 MG/10ML: INTRAMUSCULAR | Qty: 1 | Status: AC

## 2021-11-29 ENCOUNTER — Inpatient Hospital Stay: Payer: Federal, State, Local not specified - PPO | Admitting: Physician Assistant

## 2021-11-29 ENCOUNTER — Other Ambulatory Visit: Payer: Self-pay

## 2021-11-29 ENCOUNTER — Inpatient Hospital Stay: Payer: Federal, State, Local not specified - PPO

## 2021-11-29 ENCOUNTER — Inpatient Hospital Stay: Payer: Federal, State, Local not specified - PPO | Attending: Nurse Practitioner

## 2021-11-29 VITALS — BP 124/74 | Wt 113.2 lb

## 2021-11-29 VITALS — BP 131/76 | HR 60 | Temp 97.8°F | Resp 17 | Ht 66.0 in

## 2021-11-29 DIAGNOSIS — Z9221 Personal history of antineoplastic chemotherapy: Secondary | ICD-10-CM | POA: Diagnosis not present

## 2021-11-29 DIAGNOSIS — C787 Secondary malignant neoplasm of liver and intrahepatic bile duct: Secondary | ICD-10-CM | POA: Insufficient documentation

## 2021-11-29 DIAGNOSIS — D649 Anemia, unspecified: Secondary | ICD-10-CM | POA: Insufficient documentation

## 2021-11-29 DIAGNOSIS — Z1509 Genetic susceptibility to other malignant neoplasm: Secondary | ICD-10-CM | POA: Diagnosis not present

## 2021-11-29 DIAGNOSIS — Z923 Personal history of irradiation: Secondary | ICD-10-CM | POA: Diagnosis not present

## 2021-11-29 DIAGNOSIS — F419 Anxiety disorder, unspecified: Secondary | ICD-10-CM | POA: Insufficient documentation

## 2021-11-29 DIAGNOSIS — Z79899 Other long term (current) drug therapy: Secondary | ICD-10-CM | POA: Insufficient documentation

## 2021-11-29 DIAGNOSIS — Z5112 Encounter for antineoplastic immunotherapy: Secondary | ICD-10-CM | POA: Diagnosis present

## 2021-11-29 DIAGNOSIS — Z5189 Encounter for other specified aftercare: Secondary | ICD-10-CM | POA: Insufficient documentation

## 2021-11-29 DIAGNOSIS — C20 Malignant neoplasm of rectum: Secondary | ICD-10-CM

## 2021-11-29 DIAGNOSIS — C182 Malignant neoplasm of ascending colon: Secondary | ICD-10-CM | POA: Insufficient documentation

## 2021-11-29 DIAGNOSIS — I10 Essential (primary) hypertension: Secondary | ICD-10-CM | POA: Insufficient documentation

## 2021-11-29 DIAGNOSIS — Z5111 Encounter for antineoplastic chemotherapy: Secondary | ICD-10-CM | POA: Diagnosis present

## 2021-11-29 DIAGNOSIS — Z95828 Presence of other vascular implants and grafts: Secondary | ICD-10-CM

## 2021-11-29 LAB — CMP (CANCER CENTER ONLY)
ALT: 6 U/L (ref 0–44)
AST: 13 U/L — ABNORMAL LOW (ref 15–41)
Albumin: 3.6 g/dL (ref 3.5–5.0)
Alkaline Phosphatase: 80 U/L (ref 38–126)
Anion gap: 5 (ref 5–15)
BUN: 10 mg/dL (ref 8–23)
CO2: 27 mmol/L (ref 22–32)
Calcium: 8.9 mg/dL (ref 8.9–10.3)
Chloride: 107 mmol/L (ref 98–111)
Creatinine: 0.65 mg/dL (ref 0.44–1.00)
GFR, Estimated: >60 mL/min (ref >60)
Glucose, Bld: 88 mg/dL (ref 70–99)
Potassium: 4 mmol/L (ref 3.5–5.1)
Sodium: 139 mmol/L (ref 135–145)
Total Bilirubin: 0.3 mg/dL (ref 0.3–1.2)
Total Protein: 6.4 g/dL — ABNORMAL LOW (ref 6.5–8.1)

## 2021-11-29 LAB — CBC WITH DIFFERENTIAL (CANCER CENTER ONLY)
Abs Immature Granulocytes: 0.03 10*3/uL (ref 0.00–0.07)
Basophils Absolute: 0 10*3/uL (ref 0.0–0.1)
Basophils Relative: 0 %
Eosinophils Absolute: 0.1 10*3/uL (ref 0.0–0.5)
Eosinophils Relative: 2 %
HCT: 30.6 % — ABNORMAL LOW (ref 36.0–46.0)
Hemoglobin: 9.4 g/dL — ABNORMAL LOW (ref 12.0–15.0)
Immature Granulocytes: 1 %
Lymphocytes Relative: 15 %
Lymphs Abs: 1 10*3/uL (ref 0.7–4.0)
MCH: 25.5 pg — ABNORMAL LOW (ref 26.0–34.0)
MCHC: 30.7 g/dL (ref 30.0–36.0)
MCV: 83.2 fL (ref 80.0–100.0)
Monocytes Absolute: 0.5 10*3/uL (ref 0.1–1.0)
Monocytes Relative: 8 %
Neutro Abs: 4.8 10*3/uL (ref 1.7–7.7)
Neutrophils Relative %: 74 %
Platelet Count: 182 10*3/uL (ref 150–400)
RBC: 3.68 MIL/uL — ABNORMAL LOW (ref 3.87–5.11)
RDW: 17 % — ABNORMAL HIGH (ref 11.5–15.5)
WBC Count: 6.4 10*3/uL (ref 4.0–10.5)
nRBC: 0 % (ref 0.0–0.2)

## 2021-11-29 MED ORDER — HEPARIN SOD (PORK) LOCK FLUSH 100 UNIT/ML IV SOLN
500.0000 [IU] | Freq: Once | INTRAVENOUS | Status: AC | PRN
  Administered 2021-11-29: 500 [IU]

## 2021-11-29 MED ORDER — ATROPINE SULFATE 1 MG/ML IV SOLN
0.5000 mg | Freq: Once | INTRAVENOUS | Status: AC | PRN
  Administered 2021-11-29: 0.5 mg via INTRAVENOUS
  Filled 2021-11-29: qty 1

## 2021-11-29 MED ORDER — SODIUM CHLORIDE 0.9 % IV SOLN
5.0000 mg/kg | Freq: Once | INTRAVENOUS | Status: AC
  Administered 2021-11-29: 225 mg via INTRAVENOUS
  Filled 2021-11-29: qty 9

## 2021-11-29 MED ORDER — SODIUM CHLORIDE 0.9% FLUSH
10.0000 mL | Freq: Once | INTRAVENOUS | Status: AC
  Administered 2021-11-29: 10 mL

## 2021-11-29 MED ORDER — SODIUM CHLORIDE 0.9 % IV SOLN
130.0000 mg/m2 | Freq: Once | INTRAVENOUS | Status: AC
  Administered 2021-11-29: 180 mg via INTRAVENOUS
  Filled 2021-11-29: qty 9

## 2021-11-29 MED ORDER — SODIUM CHLORIDE 0.9% FLUSH
10.0000 mL | INTRAVENOUS | Status: DC | PRN
  Administered 2021-11-29: 10 mL

## 2021-11-29 MED ORDER — SODIUM CHLORIDE 0.9 % IV SOLN: Freq: Once | INTRAVENOUS | Status: AC

## 2021-11-29 MED ORDER — PALONOSETRON HCL INJECTION 0.25 MG/5ML
0.2500 mg | Freq: Once | INTRAVENOUS | Status: AC
  Administered 2021-11-29: 0.25 mg via INTRAVENOUS
  Filled 2021-11-29: qty 5

## 2021-11-29 MED ORDER — SODIUM CHLORIDE 0.9 % IV SOLN
10.0000 mg | Freq: Once | INTRAVENOUS | Status: AC
  Administered 2021-11-29: 10 mg via INTRAVENOUS
  Filled 2021-11-29: qty 10

## 2021-11-29 NOTE — Patient Instructions (Signed)
[facility] ONCOLOG[facility]  Discharge Instructions: Thank you for choosing [facility] to provide your oncology and hematology care.   If you have a lab appointment with the [facility], please go directly to the [facility] and check in at the registration area.   Wear comfortable clothing and clothing appropriate for easy access to any Portacath or PICC line.   We strive to give you quality time with your provider. You may need to reschedule your appointment if you arrive late (15 or more minutes).  Arriving late affects you and other patients whose appointments are after yours.  Also, if you miss three or more appointments without notifying the office, you may be dismissed from the clinic at the provider's discretion.      For prescription refill requests, have your pharmacy contact our office and allow 72 hours for refills to be completed.    Today you received the following chemotherapy and/or immunotherapy agents: Bevacizumab, Irinotecan.       To help prevent nausea and vomiting after your treatment, we encourage you to take your nausea medication as directed.  BELOW ARE SYMPTOMS THAT SHOULD BE REPORTED IMMEDIATELY: *FEVER GREATER THAN 100.4 F (38 C) OR HIGHER *CHILLS OR SWEATING *NAUSEA AND VOMITING THAT IS NOT CONTROLLED WITH YOUR NAUSEA MEDICATION *UNUSUAL SHORTNESS OF BREATH *UNUSUAL BRUISING OR BLEEDING *URINARY PROBLEMS (pain or burning when urinating, or frequent urination) *BOWEL PROBLEMS (unusual diarrhea, constipation, pain near the anus) TENDERNESS IN MOUTH AND THROAT WITH OR WITHOUT PRESENCE OF ULCERS (sore throat, sores in mouth, or a toothache) UNUSUAL RASH, SWELLING OR PAIN  UNUSUAL VAGINAL DISCHARGE OR ITCHING   Items with * indicate a potential emergency and should be followed up as soon as possible or go to the Emergency Department if any problems should occur.  Please show the CHEMOTHERAPY ALERT CARD or IMMUNOTHERAPY ALERT CARD  at check-in to the Emergency Department and triage nurse.  Should you have questions after your visit or need to cancel or reschedule your appointment, please contact [facility]  Dept: [phone]  and follow the prompts.  Office hours are 8:00 a.m. to 4:30 p.m. Monday - Friday. Please note that voicemails left after 4:00 p.m. may not be returned until the following business day.  We are closed weekends and major holidays. You have access to a nurse at all times for urgent questions. Please call the main number to the clinic Dept: [phone] and follow the prompts.   For any non-urgent questions, you may also contact your provider using MyChart. We now offer e-Visits for anyone 56 and older to request care online for non-urgent symptoms. For details visit [email or web address].   Also download the MyChart app! Go to the app store, search "MyChart", open the app, select Agency, and log in with your MyChart username and password.  Due to Covid, a mask is required upon entering the hospital/clinic. If you do not have a mask, one will be given to you upon arrival. For doctor visits, patients may have 1 support person aged 63 or older with them. For treatment visits, patients cannot have anyone with them due to current Covid guidelines and our immunocompromised population.

## 2021-11-29 NOTE — Progress Notes (Signed)
Spoke with Cassie, PA and will continue 225 mg dose of bevacizumab since medication was prepared prior to the updated weight was inputted into Epic. Will adjust future doses based on the adjusted weight.  Larene Beach, PharmD

## 2021-12-01 ENCOUNTER — Inpatient Hospital Stay: Payer: Federal, State, Local not specified - PPO

## 2021-12-01 VITALS — BP 123/79 | HR 86 | Temp 99.2°F | Resp 16

## 2021-12-01 DIAGNOSIS — C20 Malignant neoplasm of rectum: Secondary | ICD-10-CM

## 2021-12-01 DIAGNOSIS — Z5112 Encounter for antineoplastic immunotherapy: Secondary | ICD-10-CM | POA: Diagnosis not present

## 2021-12-01 MED ORDER — PEGFILGRASTIM-CBQV 6 MG/0.6ML ~~LOC~~ SOSY
6.0000 mg | PREFILLED_SYRINGE | Freq: Once | SUBCUTANEOUS | Status: AC
  Administered 2021-12-01: 6 mg via SUBCUTANEOUS
  Filled 2021-12-01: qty 0.6

## 2021-12-10 ENCOUNTER — Encounter (HOSPITAL_COMMUNITY): Payer: Self-pay

## 2021-12-10 ENCOUNTER — Ambulatory Visit (HOSPITAL_COMMUNITY)
Admission: RE | Admit: 2021-12-10 | Discharge: 2021-12-10 | Disposition: A | Discharge status: Home / Self Care | Payer: Federal, State, Local not specified - PPO | Source: Ambulatory Visit | Attending: Hematology | Admitting: Hematology

## 2021-12-10 DIAGNOSIS — C20 Malignant neoplasm of rectum: Secondary | ICD-10-CM | POA: Insufficient documentation

## 2021-12-10 DIAGNOSIS — C787 Secondary malignant neoplasm of liver and intrahepatic bile duct: Secondary | ICD-10-CM | POA: Insufficient documentation

## 2021-12-10 MED ORDER — SODIUM CHLORIDE (PF) 0.9 % IJ SOLN
INTRAMUSCULAR | Status: AC
  Filled 2021-12-10: qty 50

## 2021-12-10 MED ORDER — IOHEXOL 300 MG/ML  SOLN
100.0000 mL | Freq: Once | INTRAMUSCULAR | Status: AC | PRN
  Administered 2021-12-10: 100 mL via INTRAVENOUS

## 2021-12-13 ENCOUNTER — Inpatient Hospital Stay: Payer: Federal, State, Local not specified - PPO

## 2021-12-13 ENCOUNTER — Other Ambulatory Visit: Payer: Self-pay

## 2021-12-13 ENCOUNTER — Inpatient Hospital Stay: Payer: Federal, State, Local not specified - PPO | Admitting: Hematology

## 2021-12-13 VITALS — BP 137/77 | HR 87 | Temp 98.4°F | Resp 18 | Ht 66.0 in | Wt 113.1 lb

## 2021-12-13 DIAGNOSIS — C20 Malignant neoplasm of rectum: Secondary | ICD-10-CM | POA: Diagnosis not present

## 2021-12-13 DIAGNOSIS — C787 Secondary malignant neoplasm of liver and intrahepatic bile duct: Secondary | ICD-10-CM | POA: Diagnosis not present

## 2021-12-13 DIAGNOSIS — Z95828 Presence of other vascular implants and grafts: Secondary | ICD-10-CM

## 2021-12-13 DIAGNOSIS — Z5112 Encounter for antineoplastic immunotherapy: Secondary | ICD-10-CM | POA: Diagnosis not present

## 2021-12-13 LAB — CBC WITH DIFFERENTIAL (CANCER CENTER ONLY)
Abs Immature Granulocytes: 0.03 10*3/uL (ref 0.00–0.07)
Basophils Absolute: 0 10*3/uL (ref 0.0–0.1)
Basophils Relative: 1 %
Eosinophils Absolute: 0.1 10*3/uL (ref 0.0–0.5)
Eosinophils Relative: 2 %
HCT: 31 % — ABNORMAL LOW (ref 36.0–46.0)
Hemoglobin: 9.6 g/dL — ABNORMAL LOW (ref 12.0–15.0)
Immature Granulocytes: 1 %
Lymphocytes Relative: 17 %
Lymphs Abs: 1.1 10*3/uL (ref 0.7–4.0)
MCH: 26.2 pg (ref 26.0–34.0)
MCHC: 31 g/dL (ref 30.0–36.0)
MCV: 84.5 fL (ref 80.0–100.0)
Monocytes Absolute: 0.5 10*3/uL (ref 0.1–1.0)
Monocytes Relative: 8 %
Neutro Abs: 4.6 10*3/uL (ref 1.7–7.7)
Neutrophils Relative %: 71 %
Platelet Count: 200 10*3/uL (ref 150–400)
RBC: 3.67 MIL/uL — ABNORMAL LOW (ref 3.87–5.11)
RDW: 17.2 % — ABNORMAL HIGH (ref 11.5–15.5)
WBC Count: 6.4 10*3/uL (ref 4.0–10.5)
nRBC: 0 % (ref 0.0–0.2)

## 2021-12-13 LAB — CMP (CANCER CENTER ONLY)
ALT: 6 U/L (ref 0–44)
AST: 15 U/L (ref 15–41)
Albumin: 3.6 g/dL (ref 3.5–5.0)
Alkaline Phosphatase: 86 U/L (ref 38–126)
Anion gap: 4 — ABNORMAL LOW (ref 5–15)
BUN: 10 mg/dL (ref 8–23)
CO2: 28 mmol/L (ref 22–32)
Calcium: 8.9 mg/dL (ref 8.9–10.3)
Chloride: 106 mmol/L (ref 98–111)
Creatinine: 0.72 mg/dL (ref 0.44–1.00)
GFR, Estimated: >60 mL/min (ref >60)
Glucose, Bld: 112 mg/dL — ABNORMAL HIGH (ref 70–99)
Potassium: 4 mmol/L (ref 3.5–5.1)
Sodium: 138 mmol/L (ref 135–145)
Total Bilirubin: 0.3 mg/dL (ref 0.3–1.2)
Total Protein: 6.6 g/dL (ref 6.5–8.1)

## 2021-12-13 LAB — CEA (IN HOUSE-CHCC): CEA (CHCC-In House): 11.33 ng/mL — ABNORMAL HIGH (ref 0.00–5.00)

## 2021-12-13 LAB — TOTAL PROTEIN, URINE DIPSTICK: Protein, ur: NEGATIVE mg/dL

## 2021-12-13 MED ORDER — ATROPINE SULFATE 1 MG/ML IV SOLN
0.5000 mg | Freq: Once | INTRAVENOUS | Status: AC | PRN
  Administered 2021-12-13: 0.5 mg via INTRAVENOUS
  Filled 2021-12-13: qty 1

## 2021-12-13 MED ORDER — SODIUM CHLORIDE 0.9% FLUSH
10.0000 mL | Freq: Once | INTRAVENOUS | Status: AC
  Administered 2021-12-13: 10 mL

## 2021-12-13 MED ORDER — SODIUM CHLORIDE 0.9 % IV SOLN: Freq: Once | INTRAVENOUS | Status: AC

## 2021-12-13 MED ORDER — SODIUM CHLORIDE 0.9 % IV SOLN
130.0000 mg/m2 | Freq: Once | INTRAVENOUS | Status: AC
  Administered 2021-12-13: 200 mg via INTRAVENOUS
  Filled 2021-12-13: qty 10

## 2021-12-13 MED ORDER — SODIUM CHLORIDE 0.9 % IV SOLN
250.0000 mg | Freq: Once | INTRAVENOUS | Status: AC
  Administered 2021-12-13: 250 mg via INTRAVENOUS
  Filled 2021-12-13: qty 10

## 2021-12-13 MED ORDER — SODIUM CHLORIDE 0.9% FLUSH
10.0000 mL | INTRAVENOUS | Status: DC | PRN
  Administered 2021-12-13: 10 mL

## 2021-12-13 MED ORDER — HEPARIN SOD (PORK) LOCK FLUSH 100 UNIT/ML IV SOLN
500.0000 [IU] | Freq: Once | INTRAVENOUS | Status: AC | PRN
  Administered 2021-12-13: 500 [IU]

## 2021-12-13 MED ORDER — PALONOSETRON HCL INJECTION 0.25 MG/5ML
0.2500 mg | Freq: Once | INTRAVENOUS | Status: AC
  Administered 2021-12-13: 0.25 mg via INTRAVENOUS
  Filled 2021-12-13: qty 5

## 2021-12-13 MED ORDER — SODIUM CHLORIDE 0.9 % IV SOLN
10.0000 mg | Freq: Once | INTRAVENOUS | Status: AC
  Administered 2021-12-13: 10 mg via INTRAVENOUS
  Filled 2021-12-13: qty 10

## 2021-12-13 NOTE — Progress Notes (Signed)
[place]   Telephone:(336) [phone] Fax:(336) [phone]   Clinic Follow up Note   Patient Care Team: Patient, No Pcp Per as PCP - General (General Practice) [name], NP as Nurse Practitioner (Oncology) [name], MD as Consulting Physician (Oncology) [name], RN (Inactive) as Oncology Nurse Navigator  Date of Service:  12/13/2021  CHIEF COMPLAINT: f/u of metastatic rectal cancer, h/o colon cancer  CURRENT THERAPY:  FOLFIRI and bevacizumab, q2weeks, to restart 12/27/21  ASSESSMENT & PLAN:  Brittney Spencer is a 71 y.o. female with   1. Rectal adenocarcinoma, with liver and right pelvic metastasis, recurrence from previous colon cancer vs new primary   -Diagnosed on routine screening colonoscopy 05/2020, rectal mass biopsy showed invasive adenocarcinoma, arising from a tubular adenoma -Her 07/12/20 PET showed positive uptake of known rectal tumor and soft tissue mass within the right iliac fossa indicating recurrent prior colon cancer. There is also left lobe of liver lesion is FDG avid concerning for liver metastasis. pt declined liver biopsy -whether this is recurrent/metastatic colon or metastatic rectal cancer, this is not curable but still treatable. She previously declined referral for HIPEC -She began first-line chemo with dose-reduced FOLFIRI on 08/06/20. -Bevacizumab added and irinotecan/5FU increased with cycle 2, but due to poor tolerance/diarrhea irinotecan was reduced with cycle 3, tolerated better -She changed to maintenance therapy with irinotecan and bevacizumab every 2 weeks on 01/24/21. -restaging CT CAP 12/10/21 showed: new 1.5 cm right hepatic lobe metastasis which is not visible on last CT 3 months ago; stable soft tissue thickening in right iliac fossa and pulmonary nodules. I reviewed the results with her. -I discussed that her CEA has also been going up lately, most recently 9.22 on 11/15/21. Today's result is pending. I discussed that  cancer progression based on the CT scan and CEA levels.  We discussed changing treatment-- my recommendation is to add the 5FU back to her treatment. We discussed that she has overall responded very well, as she has just now progressed after about a year from stopping 5FU previously. -We also discussed other treatment options, such as FOLFOX, CapeOx for [place]. -labs reviewed, overall stable. Urine protein has remained negative. She prefers to restart 5FU with her next cycle.   2. Cancer of ascending colon, pT4bN1bM0, stage IIIC, MSI-stable, (+) surgical margins at pelvic wall     -She was diagnosed in 12/2015. She is s/p right hemicolectomy with right salpingo oophorectomy and adjuvant ChemoRT. -Her 04/2019 CT scan was NED  -With recent rectal cancer diagnosis, her PET from 07/12/20 indicates soft tissue mass within the right iliac fossa is noted and consistent with local tumor recurrence from previous ascending colon tumor.   3. Mild Anemia -Has been stable and mild for the past year.  -worse lately due to rectal cancer and starting chemo -denies bleeding -monitoring, stable in 9-range.   4. HTN, Anxiety -She'll follow-up with her primary care physician and continue medication -She uses half tab Xanax as needed, mostly only on treatment days. -stable     PLAN: -proceed with irinotecan/beva today at same dose              -[name] injection on day 3  -lab, flush, and FOLFIRI/beva on 6/29 and in 4 and 6 weeks -f/u in 2 weeks   No problem-specific Assessment & Plan notes found for this encounter.   SUMMARY OF ONCOLOGIC HISTORY: Oncology History Overview Note  Cancer Staging Cancer of ascending colon [facility]) Staging form: Colon  and Rectum, AJCC 7th Edition - Clinical stage from 02/07/2016: Stage IIIC (T4b, N1b, M0) - Signed by [name], MD on 03/04/2016 Laterality: Right Residual tumor (R): R2 - Macroscopic    Cancer of ascending colon ([place])  10/25/2015 Imaging   CT ABD/PELVIS:   Inflammatory changes inferior to the cecal tip appear improved, there is still irregular soft tissue thickening of the cecal tip, and there are adjacent prominent lymph nodes in the ileocolonic mesentery, measuring 13 mm on image 49 and 8 mm on image 52. In addition, there is a 2.5 x 1.8 cm nodule on image 46 which has central low density. Therefore, these findings are moderately suspicious for an underlying cecal malignancy with perforation.    01/19/2016 Procedure   COLONOSCOPY per Dr. [name]: Fungating, ulcerated mass almost obstructing mid ascending colon   01/19/2016 Initial Biopsy   Diagnosis Surgical [P], cecal mass - INVASIVE ADENOCARCINOMA WITH ULCERATION. - SEE COMMENT.   02/05/2016 Tumor Marker   Patient's tumor was tested for the following markers: CEA Results of the tumor marker test revealed 5.7.   02/07/2016 Initial Diagnosis   Cancer of ascending colon ([place])   02/07/2016 Definitive Surgery   Laparoscopic assisted right hemicolectomy and right salpingo oopherectomy--Dr. [name]   02/07/2016 Pathologic Stage   p T4 N1b   2/43 nodes +   02/07/2016 Pathology Results   MMR normal; G2 adenocarcinoma;proximal & distal margins negative; soft tissue mass on pelvic sidewall + for adenocarcinoma with positive margin MSI Stable   03/08/2016 Imaging   CT chest negative for metastasis.    03/19/2016 - 04/25/2016 Radiation Therapy   Adjuvant irradiation, 50 gray in 28 fractions   03/19/2016 - 04/22/2016 Chemotherapy   Xeloda 1500 mg twice daily, started on 03/19/2016, dose reduced to 1000 mg twice daily from week 3 due to neutropenia, and patient stopped 3 days before last dose radiation due to difficulty swallowing the pill    05/20/2016 -  Adjuvant Chemotherapy   Patient declined adjuvant chemotherapy   09/16/2016 Imaging   CT CAP w Contrast 1. No evidence of local tumor recurrence at the ileocolic anastomosis. 2. No findings suspicious for metastatic disease in the  chest, abdomen or pelvis. 3. Nonspecific trace free fluid in the pelvic cul-de-sac. 4. Stable solitary 3 mm right upper lobe pulmonary nodule, for which 6 month stability has been demonstrated, probably benign. 5. Additional findings include stable right posterior pericardial cyst and small calcified uterine fibroids.   05/13/2017 Imaging   CT CAP W Contrast 05/13/17 IMPRESSION: 1. No current findings of residual or recurrent malignancy. 2. Mild prominence of stool throughout the colon. Nondistended portions of the rectum. 3. Several tiny pulmonary nodules are stable from the earliest available comparison of 03/08/2016 and probably benign, but may merit surveillance. 4. Other imaging findings of potential clinical significance: Old granulomatous disease. Aortoiliac atherosclerotic vascular disease. Lumbar spondylosis and degenerative disc disease. Stable amount of trace free pelvic fluid.   04/27/2018 Imaging   04/27/2018 CT CAP IMPRESSION: Stable exam. No evidence of recurrent or metastatic carcinoma within the chest, abdomen, or pelvis   04/19/2019 Imaging   CT CAP W Contrast  IMPRESSION: Chest Impression:   1. No evidence of thoracic metastasis. 2. Stable small bilateral pulmonary nodules.   Abdomen / Pelvis Impression:   1. No evidence local colorectal carcinoma recurrence or metastasis in the abdomen pelvis. 2. Post RIGHT hemicolectomy.   01/22/2021 Imaging   CT CAP  IMPRESSION: CT CHEST IMPRESSION   1. Similar nonspecific  pulmonary nodules. 2. New posterior left upper lobe reticulonodular opacity, suspicious for interval mild infection or inflammation. 3. No thoracic adenopathy.   CT ABDOMEN AND PELVIS IMPRESSION   1. Further decrease in size of high left hepatic lobe 3 mm low-density lesion. No new or progressive metastatic disease within the abdomen or pelvis. 2. Similar trace free pelvic fluid. 3. Similar nonspecific mid rectal wall thickening. 4.   Aortic Atherosclerosis (ICD10-I70.0).   04/23/2021 Imaging   CT CAP  IMPRESSION: 1. Treated metastatic lesion between segments 2 and 3 of the liver, slightly smaller and less distinct than prior examination. No other signs of definite metastatic disease elsewhere in the abdomen or pelvis. 2. Multiple small pulmonary nodules, stable compared to the prior examination, favored to be benign. No definitive findings to suggest metastatic disease to the thorax. 3. Aortic atherosclerosis. 4. Additional incidental findings, as above.   08/13/2021 Imaging   EXAM: CT CHEST, ABDOMEN, AND PELVIS WITH CONTRAST  IMPRESSION: 1. A previously seen PET avid lesion of the anterior left lobe of the liver, hepatic segment II, is no longer discretely appreciable consistent with treatment response of a hepatic metastasis. 2. No evidence of new metastatic disease in the chest, abdomen, or pelvis. 3. Interval increase in a small focus of consolidation and nodularity of the medial left upper lobe, consistent with minimal, ongoing atypical infection. Additional tiny bilateral pulmonary nodules are stable and almost certainly incidental benign. Attention on follow-up. 4. Status post right hemicolectomy and ileocolic anastomosis.   Rectal cancer metastasized to liver ([place])  06/15/2020 Procedure   Screening Colonoscopy by Dr [name]  IMPRESSION - Decreased sphincter tone and internal hemorrhoids that prolapse with straining, but require manual replacement into the anal canal (Grade III) found on digital rectal exam. - Patent side-to-side ileo-colonic anastomosis, characterized by healthy appearing mucosa. - The examined portion of the ileum was normal. - One diminutive polyp in the proximal transverse colon, removed with a cold biopsy forceps. Resected and retrieved. - Likely malignant partially obstructing tumor in the mid rectum. Biopsied. Tattooed. - The examination was otherwise normal on direct and  retroflexion views.   06/15/2020 Initial Biopsy   Diagnosis 1. Transverse Colon Polyp - HYPERPLASTIC POLYP 2. Rectum, biopsy - ADENOCARCINOMA ARISING IN A TUBULAR ADENOMA WITH HIGH-GRADE DYSPLASIA. SEE NOTE Diagnosis Note 2. Dr. [name] reviewed the case and concurs with the diagnosis. Dr. [name] was notified on 06/16/2020.   06/28/2020 Imaging   CT CAP  IMPRESSION: 1. New low-density focus in the anterior aspect of the lateral segment LEFT hepatic lobe measuring 1.2 x 1.0 cm, compatible with small metastatic lesion in the LEFT hepatic lobe. 2. Soft tissue in the RIGHT iliac fossa following RIGHT hemicolectomy invades the psoas musculature and is slowly enlarging over time, more linear on the prior study now highly concerning for recurrence/metastasis to this location. 3. Signs of enteritis, potentially post radiation changes of the small bowel. Tethered small bowel in the RIGHT lower quadrant shows focal thickening and narrowing suspicious for small bowel involvement and developing partial obstruction though currently contrast passes beyond this point into the colon. 4. Rectal thickening in this patient with known rectal mass as described. 5. No evidence of metastatic disease in the chest. 6. Stable small pulmonary nodules. 7.  and aortic atherosclerosis.   Aortic Atherosclerosis (ICD10-I70.0) and Emphysema (ICD10-J43.9).   07/04/2020 Initial Diagnosis   Rectal cancer metastasized to liver ([place])   07/12/2020 PET scan   IMPRESSION: 1. Exam positive for FDG avid rectal tumor  which corresponds to the recent colonoscopy findings. 2. FDG avid soft tissue mass within the right iliac fossa is noted and consistent with local tumor recurrence from previous ascending colon tumor. 3. Lateral segment left lobe of liver lesion is FDG avid concerning for liver metastasis. 4. No specific findings identified to suggest metastatic disease to the chest.   08/10/2020 -  Chemotherapy    First-line FOLFIRI q2weeks starting 08/10/20. dose reduced with cycle 1. Irinotecan/5FU increased and Bevacizumab added with cycle 2 on 08/23/2020    10/27/2020 Imaging   CT CAP  IMPRESSION: 1. Interval decrease in size of the hypermetabolic left hepatic lesion, consistent with metastatic disease. No new liver lesion evident. 2. Interval resolution of the hypermetabolic soft tissue lesion along the right iliac fossa with no measurable soft tissue lesion remaining at this location today. 3. Similar appearance of soft tissue fullness in the rectum at the site of the hypermetabolic lesion seen previously. 4. Stable tiny bilateral pulmonary nodules. Continued attention on follow-up recommended. 5. Small volume free fluid in the pelvis. 6. Aortic Atherosclerosis (ICD10-I70.0).   01/22/2021 Imaging   CT CAP  IMPRESSION: CT CHEST IMPRESSION   1. Similar nonspecific pulmonary nodules. 2. New posterior left upper lobe reticulonodular opacity, suspicious for interval mild infection or inflammation. 3. No thoracic adenopathy.   CT ABDOMEN AND PELVIS IMPRESSION   1. Further decrease in size of high left hepatic lobe 3 mm low-density lesion. No new or progressive metastatic disease within the abdomen or pelvis. 2. Similar trace free pelvic fluid. 3. Similar nonspecific mid rectal wall thickening. 4.  Aortic Atherosclerosis (ICD10-I70.0).   04/23/2021 Imaging   CT CAP  IMPRESSION: 1. Treated metastatic lesion between segments 2 and 3 of the liver, slightly smaller and less distinct than prior examination. No other signs of definite metastatic disease elsewhere in the abdomen or pelvis. 2. Multiple small pulmonary nodules, stable compared to the prior examination, favored to be benign. No definitive findings to suggest metastatic disease to the thorax. 3. Aortic atherosclerosis. 4. Additional incidental findings, as above.   08/13/2021 Imaging   EXAM: CT CHEST, ABDOMEN, AND PELVIS  WITH CONTRAST  IMPRESSION: 1. A previously seen PET avid lesion of the anterior left lobe of the liver, hepatic segment II, is no longer discretely appreciable consistent with treatment response of a hepatic metastasis. 2. No evidence of new metastatic disease in the chest, abdomen, or pelvis. 3. Interval increase in a small focus of consolidation and nodularity of the medial left upper lobe, consistent with minimal, ongoing atypical infection. Additional tiny bilateral pulmonary nodules are stable and almost certainly incidental benign. Attention on follow-up. 4. Status post right hemicolectomy and ileocolic anastomosis.      INTERVAL HISTORY:  Brittney Spencer is here for a follow up of metastatic rectal cancer. She was last seen by PA Cassie on 11/29/21. She presents to the clinic alone. She reports she is doing very well overall. She notes her appetite seems up some.   All other systems were reviewed with the patient and are negative.  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 Some residual intraretinal fluid remains, very small localized subfoveal of fluid remains although this continues to slowly resorb. We'll continue to observe.   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  ALLERGIES:  is allergic to fish allergy, peanut-containing drug products, soy allergy, and buspirone.  MEDICATIONS:  Current Outpatient Medications  Medication Sig Dispense Refill   ALPRAZolam (XANAX) 0.25 MG tablet Take 1 tablet (0.25 mg total) by mouth daily as needed for anxiety. 30 tablet 0   Cholecalciferol (VITAMIN D3 PO) Take by mouth daily.     diphenoxylate-atropine (LOMOTIL) 2.5-0.025 MG tablet Take 2 tablets by mouth 4 (four) times daily as needed for diarrhea or loose stools. 45 tablet 3   docusate sodium (COLACE) 100 MG capsule 1 capsule as needed     lidocaine-prilocaine (EMLA) cream Apply 1 application topically as needed. 30 g 1   NORVASC 2.5 MG tablet TAKE 1 TABLET BY MOUTH EVERY DAY 90 tablet 1   ondansetron (ZOFRAN) 8 MG tablet Take 1 tablet (8 mg total) by mouth every 8 (eight) hours as needed for nausea or vomiting. 20 tablet 2   Current Facility-Administered Medications  Medication Dose Route Frequency Provider Last Rate Last Admin   0.9 %  sodium chloride infusion  500 mL Intravenous Once Doran Stabler, MD       Facility-Administered Medications Ordered in Other Visits  Medication Dose Route Frequency Provider Last Rate Last Admin   atropine injection 0.5 mg  0.5 mg Intravenous Once PRN [name], MD       bevacizumab-bvzr (ZIRABEV) 250 mg in sodium chloride 0.9 % 100 mL chemo infusion  250 mg Intravenous Once [name], MD       dexamethasone (DECADRON) 10 mg in sodium chloride 0.9 %  50 mL IVPB  10 mg Intravenous Once [name], MD 204 mL/hr at 12/13/21 1108 10 mg at 12/13/21 1108   heparin lock flush 100 unit/mL  500 Units Intracatheter Once PRN [name], MD       irinotecan (CAMPTOSAR) 200 mg in sodium chloride 0.9 % 500 mL chemo infusion  130 mg/m2 (Treatment Plan Recorded) Intravenous Once [name], MD       sodium chloride flush (NS) 0.9 % injection 10 mL  10 mL Intracatheter PRN [name], MD        PHYSICAL EXAMINATION: ECOG PERFORMANCE STATUS: 1 - Symptomatic but completely ambulatory  Vitals:   12/13/21 1015  BP: 137/77  Pulse: 87  Resp: 18  Temp: 98.4 F (36.9 C)  SpO2: 100%   Wt Readings from Last 3 Encounters:  12/13/21 113 lb 1.6  oz (51.3 kg)  11/29/21 113 lb 4 oz (51.4 kg)  11/15/21 112 lb (50.8 kg)     GENERAL:alert, no distress and comfortable SKIN: skin color normal, no rashes or significant lesions EYES: normal, Conjunctiva are pink and non-injected, sclera clear  NEURO: alert & oriented x 3 with fluent speech  LABORATORY DATA:  I have reviewed the data as listed    Latest Ref Rng & Units 12/13/2021    9:49 AM 11/29/2021    8:24 AM 11/15/2021    7:42 AM  CBC  WBC 4.0 - 10.5 K/uL 6.4  6.4  5.9   Hemoglobin 12.0 - 15.0 g/dL 9.6  9.4  9.7   Hematocrit 36.0 - 46.0 % 31.0  30.6  31.8   Platelets 150 - 400 K/uL 200  182  227         Latest Ref Rng & Units 12/13/2021    9:49 AM 11/29/2021    8:24 AM 11/15/2021    7:42 AM  CMP  Glucose 70 - 99 mg/dL 112  88  89   BUN 8 - 23 mg/dL '10  10  11   ' Creatinine 0.44 - 1.00 mg/dL 0.72  0.65  0.72   Sodium 135 - 145 mmol/L 138  139  138   Potassium 3.5 - 5.1 mmol/L 4.0  4.0  4.1   Chloride 98 - 111 mmol/L 106  107  106   CO2 22 - 32 mmol/L '28  27  26   ' Calcium 8.9 - 10.3 mg/dL 8.9  8.9  8.7   Total Protein 6.5 - 8.1 g/dL 6.6  6.4  6.8   Total Bilirubin 0.3 - 1.2 mg/dL 0.3  0.3  0.2   Alkaline Phos 38 - 126 U/L 86  80  84   AST 15 - 41 U/L '15  13  13   ' ALT 0 - 44 U/L '6  6  6        ' RADIOGRAPHIC STUDIES: I have personally reviewed the radiological images as listed and agreed with the findings in the report. No results found.    No orders of the defined types were placed in this encounter.  All questions were answered. The patient knows to call the clinic with any problems, questions or concerns. No barriers to learning was detected. The total time spent in the appointment was 40 minutes.     [name], MD 12/13/2021   I, Wilburn Mylar, am acting as scribe for [name], MD.   I have reviewed the above documentation for accuracy and completeness, and I agree with the above.

## 2021-12-13 NOTE — Patient Instructions (Signed)
[facility] MEDICAL ONCOLOGY  Discharge Instructions: Thank you for choosing [facility] to provide your oncology and hematology care.   If you have a lab appointment with the Cancer Center, please go directly to the Cancer Center and check in at the registration area.   Wear comfortable clothing and clothing appropriate for easy access to any Portacath or PICC line.   We strive to give you quality time with your provider. You may need to reschedule your appointment if you arrive late (15 or more minutes).  Arriving late affects you and other patients whose appointments are after yours.  Also, if you miss three or more appointments without notifying the office, you may be dismissed from the clinic at the provider's discretion.      For prescription refill requests, have your pharmacy contact our office and allow 72 hours for refills to be completed.    Today you received the following chemotherapy and/or immunotherapy agents Zirabev & Irinotecan      To help prevent nausea and vomiting after your treatment, we encourage you to take your nausea medication as directed.  BELOW ARE SYMPTOMS THAT SHOULD BE REPORTED IMMEDIATELY: *FEVER GREATER THAN 100.4 F (38 C) OR HIGHER *CHILLS OR SWEATING *NAUSEA AND VOMITING THAT IS NOT CONTROLLED WITH YOUR NAUSEA MEDICATION *UNUSUAL SHORTNESS OF BREATH *UNUSUAL BRUISING OR BLEEDING *URINARY PROBLEMS (pain or burning when urinating, or frequent urination) *BOWEL PROBLEMS (unusual diarrhea, constipation, pain near the anus) TENDERNESS IN MOUTH AND THROAT WITH OR WITHOUT PRESENCE OF ULCERS (sore throat, sores in mouth, or a toothache) UNUSUAL RASH, SWELLING OR PAIN  UNUSUAL VAGINAL DISCHARGE OR ITCHING   Items with * indicate a potential emergency and should be followed up as soon as possible or go to the Emergency Department if any problems should occur.  Please show the CHEMOTHERAPY ALERT CARD or IMMUNOTHERAPY ALERT CARD at  check-in to the Emergency Department and triage nurse.  Should you have questions after your visit or need to cancel or reschedule your appointment, please contact [facility] MEDICAL ONCOLOG[facility]  Dept: [phone]  and follow the prompts.  Office hours are 8:00 a.m. to 4:30 p.m. Monday - Friday. Please note that voicemails left after 4:00 p.m. may not be returned until the following business day.  We are closed weekends and major holidays. You have access to a nurse at all times for urgent questions. Please call the main number to the clinic Dept: [phone] and follow the prompts.   For any non-urgent questions, you may also contact your provider using MyChart. We now offer e-Visits for anyone 18 and older to request care online for non-urgent symptoms. For details visit [email or web address].   Also download the MyChart app! Go to the app store, search "MyChart", open the app, select [facility], and log in with your MyChart username and password.  Masks are optional in the cancer centers. If you would like for your care team to wear a mask while they are taking care of you, please let them know. For doctor visits, patients may have with them one support person who is at least 73 years old. At this time, visitors are not allowed in the infusion area. 

## 2021-12-14 ENCOUNTER — Telehealth: Payer: Self-pay | Admitting: Hematology

## 2021-12-14 NOTE — Telephone Encounter (Signed)
Scheduled follow-up appointments per 6/15 los. Patient is aware.

## 2021-12-15 ENCOUNTER — Other Ambulatory Visit: Payer: Self-pay

## 2021-12-15 ENCOUNTER — Inpatient Hospital Stay: Payer: Federal, State, Local not specified - PPO

## 2021-12-15 VITALS — BP 124/79 | HR 92 | Temp 97.7°F

## 2021-12-15 DIAGNOSIS — C20 Malignant neoplasm of rectum: Secondary | ICD-10-CM

## 2021-12-15 DIAGNOSIS — Z5112 Encounter for antineoplastic immunotherapy: Secondary | ICD-10-CM | POA: Diagnosis not present

## 2021-12-15 MED ORDER — PEGFILGRASTIM-CBQV 6 MG/0.6ML ~~LOC~~ SOSY
6.0000 mg | PREFILLED_SYRINGE | Freq: Once | SUBCUTANEOUS | Status: AC
  Administered 2021-12-15: 6 mg via SUBCUTANEOUS
  Filled 2021-12-15: qty 0.6

## 2021-12-15 NOTE — Patient Instructions (Signed)

## 2021-12-26 MED FILL — Dexamethasone Sodium Phosphate Inj 100 MG/10ML: INTRAMUSCULAR | Qty: 1 | Status: AC

## 2021-12-27 ENCOUNTER — Other Ambulatory Visit: Payer: Self-pay

## 2021-12-27 ENCOUNTER — Inpatient Hospital Stay: Payer: Federal, State, Local not specified - PPO

## 2021-12-27 VITALS — BP 134/73 | HR 79 | Temp 98.1°F | Resp 18 | Wt 111.8 lb

## 2021-12-27 DIAGNOSIS — Z5112 Encounter for antineoplastic immunotherapy: Secondary | ICD-10-CM | POA: Diagnosis not present

## 2021-12-27 DIAGNOSIS — Z95828 Presence of other vascular implants and grafts: Secondary | ICD-10-CM

## 2021-12-27 DIAGNOSIS — C787 Secondary malignant neoplasm of liver and intrahepatic bile duct: Secondary | ICD-10-CM

## 2021-12-27 LAB — CMP (CANCER CENTER ONLY)
ALT: 6 U/L (ref 0–44)
AST: 12 U/L — ABNORMAL LOW (ref 15–41)
Albumin: 3.7 g/dL (ref 3.5–5.0)
Alkaline Phosphatase: 87 U/L (ref 38–126)
Anion gap: 5 (ref 5–15)
BUN: 11 mg/dL (ref 8–23)
CO2: 27 mmol/L (ref 22–32)
Calcium: 9 mg/dL (ref 8.9–10.3)
Chloride: 105 mmol/L (ref 98–111)
Creatinine: 0.66 mg/dL (ref 0.44–1.00)
GFR, Estimated: >60 mL/min (ref >60)
Glucose, Bld: 83 mg/dL (ref 70–99)
Potassium: 3.9 mmol/L (ref 3.5–5.1)
Sodium: 137 mmol/L (ref 135–145)
Total Bilirubin: 0.3 mg/dL (ref 0.3–1.2)
Total Protein: 6.6 g/dL (ref 6.5–8.1)

## 2021-12-27 LAB — CBC WITH DIFFERENTIAL (CANCER CENTER ONLY)
Abs Immature Granulocytes: 0.03 10*3/uL (ref 0.00–0.07)
Basophils Absolute: 0 10*3/uL (ref 0.0–0.1)
Basophils Relative: 0 %
Eosinophils Absolute: 0.1 10*3/uL (ref 0.0–0.5)
Eosinophils Relative: 2 %
HCT: 30.7 % — ABNORMAL LOW (ref 36.0–46.0)
Hemoglobin: 9.5 g/dL — ABNORMAL LOW (ref 12.0–15.0)
Immature Granulocytes: 0 %
Lymphocytes Relative: 14 %
Lymphs Abs: 1 10*3/uL (ref 0.7–4.0)
MCH: 25.7 pg — ABNORMAL LOW (ref 26.0–34.0)
MCHC: 30.9 g/dL (ref 30.0–36.0)
MCV: 83 fL (ref 80.0–100.0)
Monocytes Absolute: 0.6 10*3/uL (ref 0.1–1.0)
Monocytes Relative: 8 %
Neutro Abs: 5.4 10*3/uL (ref 1.7–7.7)
Neutrophils Relative %: 76 %
Platelet Count: 191 10*3/uL (ref 150–400)
RBC: 3.7 MIL/uL — ABNORMAL LOW (ref 3.87–5.11)
RDW: 16.8 % — ABNORMAL HIGH (ref 11.5–15.5)
WBC Count: 7.2 10*3/uL (ref 4.0–10.5)
nRBC: 0 % (ref 0.0–0.2)

## 2021-12-27 LAB — TOTAL PROTEIN, URINE DIPSTICK: Protein, ur: NEGATIVE mg/dL

## 2021-12-27 MED ORDER — SODIUM CHLORIDE 0.9 % IV SOLN
2170.0000 mg/m2 | INTRAVENOUS | Status: DC
  Administered 2021-12-27: 3350 mg via INTRAVENOUS
  Filled 2021-12-27: qty 67

## 2021-12-27 MED ORDER — PALONOSETRON HCL INJECTION 0.25 MG/5ML
0.2500 mg | Freq: Once | INTRAVENOUS | Status: AC
  Administered 2021-12-27: 0.25 mg via INTRAVENOUS
  Filled 2021-12-27: qty 5

## 2021-12-27 MED ORDER — SODIUM CHLORIDE 0.9 % IV SOLN
130.0000 mg/m2 | Freq: Once | INTRAVENOUS | Status: AC
  Administered 2021-12-27: 200 mg via INTRAVENOUS
  Filled 2021-12-27: qty 10

## 2021-12-27 MED ORDER — SODIUM CHLORIDE 0.9 % IV SOLN
10.0000 mg | Freq: Once | INTRAVENOUS | Status: AC
  Administered 2021-12-27: 10 mg via INTRAVENOUS
  Filled 2021-12-27: qty 10

## 2021-12-27 MED ORDER — SODIUM CHLORIDE 0.9 % IV SOLN: Freq: Once | INTRAVENOUS | Status: AC

## 2021-12-27 MED ORDER — SODIUM CHLORIDE 0.9% FLUSH
10.0000 mL | Freq: Once | INTRAVENOUS | Status: AC
  Administered 2021-12-27: 10 mL

## 2021-12-27 MED ORDER — SODIUM CHLORIDE 0.9 % IV SOLN
400.0000 mg/m2 | Freq: Once | INTRAVENOUS | Status: AC
  Administered 2021-12-27: 616 mg via INTRAVENOUS
  Filled 2021-12-27: qty 30.8

## 2021-12-27 MED ORDER — ATROPINE SULFATE 1 MG/ML IV SOLN
0.5000 mg | Freq: Once | INTRAVENOUS | Status: AC | PRN
  Administered 2021-12-27: 0.5 mg via INTRAVENOUS
  Filled 2021-12-27: qty 1

## 2021-12-27 MED ORDER — SODIUM CHLORIDE 0.9 % IV SOLN
250.0000 mg | Freq: Once | INTRAVENOUS | Status: AC
  Administered 2021-12-27: 250 mg via INTRAVENOUS
  Filled 2021-12-27: qty 10

## 2021-12-27 NOTE — Patient Instructions (Signed)
[facility]  Discharge Instructions: Thank you for choosing [facility] to provide your oncology and hematology care.   If you have a lab appointment with the [facility], please go directly to the [facility] and check in at the registration area.   Wear comfortable clothing and clothing appropriate for easy access to any Portacath or PICC line.   We strive to give you quality time with your provider. You may need to reschedule your appointment if you arrive late (15 or more minutes).  Arriving late affects you and other patients whose appointments are after yours.  Also, if you miss three or more appointments without notifying the office, you may be dismissed from the clinic at the provider's discretion.      For prescription refill requests, have your pharmacy contact our office and allow 72 hours for refills to be completed.    Today you received the following chemotherapy and/or immunotherapy agents: Bevacizumab, Irinotecan, Leucovorin, Fluorouracil.       To help prevent nausea and vomiting after your treatment, we encourage you to take your nausea medication as directed.  BELOW ARE SYMPTOMS THAT SHOULD BE REPORTED IMMEDIATELY: *FEVER GREATER THAN 100.4 F (38 C) OR HIGHER *CHILLS OR SWEATING *NAUSEA AND VOMITING THAT IS NOT CONTROLLED WITH YOUR NAUSEA MEDICATION *UNUSUAL SHORTNESS OF BREATH *UNUSUAL BRUISING OR BLEEDING *URINARY PROBLEMS (pain or burning when urinating, or frequent urination) *BOWEL PROBLEMS (unusual diarrhea, constipation, pain near the anus) TENDERNESS IN MOUTH AND THROAT WITH OR WITHOUT PRESENCE OF ULCERS (sore throat, sores in mouth, or a toothache) UNUSUAL RASH, SWELLING OR PAIN  UNUSUAL VAGINAL DISCHARGE OR ITCHING   Items with * indicate a potential emergency and should be followed up as soon as possible or go to the Emergency Department if any problems should occur.  Please show the CHEMOTHERAPY ALERT CARD  or IMMUNOTHERAPY ALERT CARD at check-in to the Emergency Department and triage nurse.  Should you have questions after your visit or need to cancel or reschedule your appointment, please contact [facility]  Dept: [phone]  and follow the prompts.  Office hours are 8:00 a.m. to 4:30 p.m. Monday - Friday. Please note that voicemails left after 4:00 p.m. may not be returned until the following business day.  We are closed weekends and major holidays. You have access to a nurse at all times for urgent questions. Please call the main number to the clinic Dept: [phone] and follow the prompts.   For any non-urgent questions, you may also contact your provider using MyChart. We now offer e-Visits for anyone 73 and older to request care online for non-urgent symptoms. For details visit [email or web address].   Also download the MyChart app! Go to the app store, search "MyChart", open the app, select [facility], and log in with your MyChart username and password.  Masks are optional in the cancer centers. If you would like for your care team to wear a mask while they are taking care of you, please let them know. For doctor visits, patients may have with them one support person who is at least 73 years old. At this time, visitors are not allowed in the infusion area.

## 2021-12-28 NOTE — Progress Notes (Signed)
The following biosimilar [name] ([id number]) has been selected for use in this patient.  Larene Beach, PharmD

## 2021-12-29 ENCOUNTER — Inpatient Hospital Stay: Payer: Federal, State, Local not specified - PPO | Attending: Nurse Practitioner

## 2021-12-29 VITALS — BP 154/70 | HR 103 | Temp 98.6°F | Resp 18

## 2021-12-29 DIAGNOSIS — C182 Malignant neoplasm of ascending colon: Secondary | ICD-10-CM | POA: Diagnosis not present

## 2021-12-29 DIAGNOSIS — D649 Anemia, unspecified: Secondary | ICD-10-CM | POA: Insufficient documentation

## 2021-12-29 DIAGNOSIS — I10 Essential (primary) hypertension: Secondary | ICD-10-CM | POA: Diagnosis not present

## 2021-12-29 DIAGNOSIS — C787 Secondary malignant neoplasm of liver and intrahepatic bile duct: Secondary | ICD-10-CM | POA: Insufficient documentation

## 2021-12-29 DIAGNOSIS — Z1509 Genetic susceptibility to other malignant neoplasm: Secondary | ICD-10-CM | POA: Insufficient documentation

## 2021-12-29 DIAGNOSIS — Z5189 Encounter for other specified aftercare: Secondary | ICD-10-CM | POA: Diagnosis not present

## 2021-12-29 DIAGNOSIS — Z9221 Personal history of antineoplastic chemotherapy: Secondary | ICD-10-CM | POA: Diagnosis not present

## 2021-12-29 DIAGNOSIS — F419 Anxiety disorder, unspecified: Secondary | ICD-10-CM | POA: Diagnosis not present

## 2021-12-29 DIAGNOSIS — Z923 Personal history of irradiation: Secondary | ICD-10-CM | POA: Diagnosis not present

## 2021-12-29 DIAGNOSIS — Z79899 Other long term (current) drug therapy: Secondary | ICD-10-CM | POA: Diagnosis not present

## 2021-12-29 MED ORDER — HEPARIN SOD (PORK) LOCK FLUSH 100 UNIT/ML IV SOLN
500.0000 [IU] | Freq: Once | INTRAVENOUS | Status: AC | PRN
  Administered 2021-12-29: 500 [IU]

## 2021-12-29 MED ORDER — SODIUM CHLORIDE 0.9% FLUSH
10.0000 mL | INTRAVENOUS | Status: DC | PRN
  Administered 2021-12-29: 10 mL

## 2021-12-29 MED ORDER — PEGFILGRASTIM-CBQV 6 MG/0.6ML ~~LOC~~ SOSY
6.0000 mg | PREFILLED_SYRINGE | Freq: Once | SUBCUTANEOUS | Status: AC
  Administered 2021-12-29: 6 mg via SUBCUTANEOUS

## 2021-12-29 NOTE — Progress Notes (Signed)
Patient arrived and was surprised that she was getting a [name] injection today. She thought that with changing to [facility] she was told by MD that she was not getting that. It was verified by pharmacy yesterday and Dr. Burr Medico did not have a note in explaining it was not to be given and it was in the treatment plan for today. Beverly Sessions with pharmacy and she said that she was seeing what I was and that if the patient understood the above then we can safely give it today or we can wait until Monday and check with the MD prior. Patient said she was fine to get it today then and I assured her I would message her MD about this and ask her to review the plan and update the patient with any changes she may make to future cycles or not.   Patient was very nauseous today and was not sure she should be taking her medications at home during this time. She thought she had 3 medications at home for nausea but I only saw Zofran on her chart. I jotted down possible medications she should have at home then and the order in which they should be taken. I educated her to yes, be taking them at home while the 5 FU is infusing with future cycles so on day 3 she does not feel so bad. She verbalized understanding.

## 2021-12-30 ENCOUNTER — Other Ambulatory Visit: Payer: Self-pay | Admitting: Hematology

## 2022-01-09 MED FILL — Dexamethasone Sodium Phosphate Inj 100 MG/10ML: INTRAMUSCULAR | Qty: 1 | Status: AC

## 2022-01-09 NOTE — Progress Notes (Signed)
[facility]   [phone] [phone]   Clinic Follow up Note   Patient Care Team: Patient, No Pcp Per as PCP - General (General Practice) [name], NP as Nurse Practitioner (Oncology) [name], MD as Consulting Physician (Oncology) [name], RN (Inactive) as Oncology Nurse Navigator  Date of Service:  01/10/2022  CHIEF COMPLAINT: f/u of metastatic rectal cancer, h/o colon cancer  CURRENT THERAPY:  FOLFIRI and bevacizumab, q2weeks, restarted 12/27/21  ASSESSMENT & PLAN:  Brittney Spencer is a 71 y.o. female with   1. Rectal adenocarcinoma, with liver and right pelvic metastasis, recurrence from previous colon cancer vs new primary   -Diagnosed on routine screening colonoscopy 05/2020, rectal mass biopsy showed invasive adenocarcinoma, arising from a tubular adenoma -Her 07/12/20 PET showed positive uptake of known rectal tumor and soft tissue mass within the right iliac fossa indicating recurrent prior colon cancer. There is also left lobe of liver lesion is FDG avid concerning for liver metastasis. pt declined liver biopsy -whether this is recurrent/metastatic colon or metastatic rectal cancer, this is not curable but still treatable. She previously declined referral for HIPEC -She began first-line chemo with dose-reduced FOLFIRI on 08/06/20, Bevacizumab added with cycle 2. -She changed to maintenance therapy with irinotecan and bevacizumab every 2 weeks on 01/24/21. -restaging CT CAP 12/10/21 showed: new 1.5 cm right hepatic lobe metastasis which is not visible on last CT 3 months ago; stable soft tissue thickening in right iliac fossa and pulmonary nodules.  -Her CEA has also been rising, up to 11.33 on 12/13/21. 5FU was added back to her treatment on 12/27/21. -She did not tolerate FOLFIRI well 2 weeks ago, still very tired with low appetite.  We will hold on treatment for today.  Labs reviewed, overall stable.  -Plan to decrease chemo  dose by 15% will restart chemo in 2 weeks   2. Cancer of ascending colon, [id number], stage IIIC, MSI-stable, (+) surgical margins at pelvic wall     -She was diagnosed in 12/2015. She is s/p right hemicolectomy with right salpingo oophorectomy and adjuvant ChemoRT. -Her 04/2019 CT scan was NED  -With recent rectal cancer diagnosis, her PET from 07/12/20 indicates soft tissue mass within the right iliac fossa is noted and consistent with local tumor recurrence from previous ascending colon tumor.   3. Mild Anemia -Has been stable and mild for the past year.  -worse lately due to rectal cancer and starting chemo -denies bleeding -monitoring, stable in 9-range.   4. HTN, Anxiety -She'll follow-up with her primary care physician and continue medication -She uses half tab Xanax as needed, mostly only on treatment days. -stable     PLAN: -She has not recovered well from last cycle chemo, we will hold her treatment today. -She will return in 2 weeks to restart chemo FOLFIRI and bevacizumab with dose reduction.  I will also add on IV Emend as a premedication.   No problem-specific Assessment & Plan notes found for this encounter.   SUMMARY OF ONCOLOGIC HISTORY: Oncology History Overview Note  Cancer Staging Cancer of ascending colon [facility]) Staging form: Colon and Rectum, AJCC 7th Edition - Clinical stage from 02/07/2016: Stage IIIC (T4b, N1b, M0) - Signed by [name], MD on 03/04/2016 Laterality: Right Residual tumor (R): R2 - Macroscopic    Cancer of ascending colon ([place])  10/25/2015 Imaging   CT ABD/PELVIS:  Inflammatory changes inferior to the cecal tip appear improved, there is still irregular soft tissue thickening  of the cecal tip, and there are adjacent prominent lymph nodes in the ileocolonic mesentery, measuring 13 mm on image 49 and 8 mm on image 52. In addition, there is a 2.5 x 1.8 cm nodule on image 46 which has central low density. Therefore, these findings are moderately  suspicious for an underlying cecal malignancy with perforation.    01/19/2016 Procedure   COLONOSCOPY per Dr. [name]: Fungating, ulcerated mass almost obstructing mid ascending colon   01/19/2016 Initial Biopsy   Diagnosis Surgical [P], cecal mass - INVASIVE ADENOCARCINOMA WITH ULCERATION. - SEE COMMENT.   02/05/2016 Tumor Marker   Patient's tumor was tested for the following markers: CEA Results of the tumor marker test revealed 5.7.   02/07/2016 Initial Diagnosis   Cancer of ascending colon ([place])   02/07/2016 Definitive Surgery   Laparoscopic assisted right hemicolectomy and right salpingo oopherectomy--Dr. [name]   02/07/2016 Pathologic Stage   p T4 N1b   2/43 nodes +   02/07/2016 Pathology Results   MMR normal; G2 adenocarcinoma;proximal & distal margins negative; soft tissue mass on pelvic sidewall + for adenocarcinoma with positive margin MSI Stable   03/08/2016 Imaging   CT chest negative for metastasis.    03/19/2016 - 04/25/2016 Radiation Therapy   Adjuvant irradiation, 50 gray in 28 fractions   03/19/2016 - 04/22/2016 Chemotherapy   Xeloda 1500 mg twice daily, started on 03/19/2016, dose reduced to 1000 mg twice daily from week 3 due to neutropenia, and patient stopped 3 days before last dose radiation due to difficulty swallowing the pill    05/20/2016 -  Adjuvant Chemotherapy   Patient declined adjuvant chemotherapy   09/16/2016 Imaging   CT CAP w Contrast 1. No evidence of local tumor recurrence at the ileocolic anastomosis. 2. No findings suspicious for metastatic disease in the chest, abdomen or pelvis. 3. Nonspecific trace free fluid in the pelvic cul-de-sac. 4. Stable solitary 3 mm right upper lobe pulmonary nodule, for which 6 month stability has been demonstrated, probably benign. 5. Additional findings include stable right posterior pericardial cyst and small calcified uterine fibroids.   05/13/2017 Imaging   CT CAP W Contrast 05/13/17 IMPRESSION: 1. No  current findings of residual or recurrent malignancy. 2. Mild prominence of stool throughout the colon. Nondistended portions of the rectum. 3. Several tiny pulmonary nodules are stable from the earliest available comparison of 03/08/2016 and probably benign, but may merit surveillance. 4. Other imaging findings of potential clinical significance: Old granulomatous disease. Aortoiliac atherosclerotic vascular disease. Lumbar spondylosis and degenerative disc disease. Stable amount of trace free pelvic fluid.   04/27/2018 Imaging   04/27/2018 CT CAP IMPRESSION: Stable exam. No evidence of recurrent or metastatic carcinoma within the chest, abdomen, or pelvis   04/19/2019 Imaging   CT CAP W Contrast  IMPRESSION: Chest Impression:   1. No evidence of thoracic metastasis. 2. Stable small bilateral pulmonary nodules.   Abdomen / Pelvis Impression:   1. No evidence local colorectal carcinoma recurrence or metastasis in the abdomen pelvis. 2. Post RIGHT hemicolectomy.   01/22/2021 Imaging   CT CAP  IMPRESSION: CT CHEST IMPRESSION   1. Similar nonspecific pulmonary nodules. 2. New posterior left upper lobe reticulonodular opacity, suspicious for interval mild infection or inflammation. 3. No thoracic adenopathy.   CT ABDOMEN AND PELVIS IMPRESSION   1. Further decrease in size of high left hepatic lobe 3 mm low-density lesion. No new or progressive metastatic disease within the abdomen or pelvis. 2. Similar trace free pelvic fluid. 3. Similar  nonspecific mid rectal wall thickening. 4.  Aortic Atherosclerosis (ICD10-I70.0).   04/23/2021 Imaging   CT CAP  IMPRESSION: 1. Treated metastatic lesion between segments 2 and 3 of the liver, slightly smaller and less distinct than prior examination. No other signs of definite metastatic disease elsewhere in the abdomen or pelvis. 2. Multiple small pulmonary nodules, stable compared to the prior examination, favored to be  benign. No definitive findings to suggest metastatic disease to the thorax. 3. Aortic atherosclerosis. 4. Additional incidental findings, as above.   08/13/2021 Imaging   EXAM: CT CHEST, ABDOMEN, AND PELVIS WITH CONTRAST  IMPRESSION: 1. A previously seen PET avid lesion of the anterior left lobe of the liver, hepatic segment II, is no longer discretely appreciable consistent with treatment response of a hepatic metastasis. 2. No evidence of new metastatic disease in the chest, abdomen, or pelvis. 3. Interval increase in a small focus of consolidation and nodularity of the medial left upper lobe, consistent with minimal, ongoing atypical infection. Additional tiny bilateral pulmonary nodules are stable and almost certainly incidental benign. Attention on follow-up. 4. Status post right hemicolectomy and ileocolic anastomosis.   Rectal cancer metastasized to liver ([place])  06/15/2020 Procedure   Screening Colonoscopy by Dr [name]  IMPRESSION - Decreased sphincter tone and internal hemorrhoids that prolapse with straining, but require manual replacement into the anal canal (Grade III) found on digital rectal exam. - Patent side-to-side ileo-colonic anastomosis, characterized by healthy appearing mucosa. - The examined portion of the ileum was normal. - One diminutive polyp in the proximal transverse colon, removed with a cold biopsy forceps. Resected and retrieved. - Likely malignant partially obstructing tumor in the mid rectum. Biopsied. Tattooed. - The examination was otherwise normal on direct and retroflexion views.   06/15/2020 Initial Biopsy   Diagnosis 1. Transverse Colon Polyp - HYPERPLASTIC POLYP 2. Rectum, biopsy - ADENOCARCINOMA ARISING IN A TUBULAR ADENOMA WITH HIGH-GRADE DYSPLASIA. SEE NOTE Diagnosis Note 2. Dr. [name] reviewed the case and concurs with the diagnosis. Dr. [name] was notified on 06/16/2020.   06/28/2020 Imaging   CT CAP  IMPRESSION: 1. New  low-density focus in the anterior aspect of the lateral segment LEFT hepatic lobe measuring 1.2 x 1.0 cm, compatible with small metastatic lesion in the LEFT hepatic lobe. 2. Soft tissue in the RIGHT iliac fossa following RIGHT hemicolectomy invades the psoas musculature and is slowly enlarging over time, more linear on the prior study now highly concerning for recurrence/metastasis to this location. 3. Signs of enteritis, potentially post radiation changes of the small bowel. Tethered small bowel in the RIGHT lower quadrant shows focal thickening and narrowing suspicious for small bowel involvement and developing partial obstruction though currently contrast passes beyond this point into the colon. 4. Rectal thickening in this patient with known rectal mass as described. 5. No evidence of metastatic disease in the chest. 6. Stable small pulmonary nodules. 7.  and aortic atherosclerosis.   Aortic Atherosclerosis (ICD10-I70.0) and Emphysema (ICD10-J43.9).   07/04/2020 Initial Diagnosis   Rectal cancer metastasized to liver ([place])   07/12/2020 PET scan   IMPRESSION: 1. Exam positive for FDG avid rectal tumor which corresponds to the recent colonoscopy findings. 2. FDG avid soft tissue mass within the right iliac fossa is noted and consistent with local tumor recurrence from previous ascending colon tumor. 3. Lateral segment left lobe of liver lesion is FDG avid concerning for liver metastasis. 4. No specific findings identified to suggest metastatic disease to the chest.   08/10/2020 -  Chemotherapy   First-line FOLFIRI q2weeks starting 08/10/20. dose reduced with cycle 1. Irinotecan/5FU increased and Bevacizumab added with cycle 2 on 08/23/2020    10/27/2020 Imaging   CT CAP  IMPRESSION: 1. Interval decrease in size of the hypermetabolic left hepatic lesion, consistent with metastatic disease. No new liver lesion evident. 2. Interval resolution of the hypermetabolic soft tissue  lesion along the right iliac fossa with no measurable soft tissue lesion remaining at this location today. 3. Similar appearance of soft tissue fullness in the rectum at the site of the hypermetabolic lesion seen previously. 4. Stable tiny bilateral pulmonary nodules. Continued attention on follow-up recommended. 5. Small volume free fluid in the pelvis. 6. Aortic Atherosclerosis (ICD10-I70.0).   01/22/2021 Imaging   CT CAP  IMPRESSION: CT CHEST IMPRESSION   1. Similar nonspecific pulmonary nodules. 2. New posterior left upper lobe reticulonodular opacity, suspicious for interval mild infection or inflammation. 3. No thoracic adenopathy.   CT ABDOMEN AND PELVIS IMPRESSION   1. Further decrease in size of high left hepatic lobe 3 mm low-density lesion. No new or progressive metastatic disease within the abdomen or pelvis. 2. Similar trace free pelvic fluid. 3. Similar nonspecific mid rectal wall thickening. 4.  Aortic Atherosclerosis (ICD10-I70.0).   04/23/2021 Imaging   CT CAP  IMPRESSION: 1. Treated metastatic lesion between segments 2 and 3 of the liver, slightly smaller and less distinct than prior examination. No other signs of definite metastatic disease elsewhere in the abdomen or pelvis. 2. Multiple small pulmonary nodules, stable compared to the prior examination, favored to be benign. No definitive findings to suggest metastatic disease to the thorax. 3. Aortic atherosclerosis. 4. Additional incidental findings, as above.   08/13/2021 Imaging   EXAM: CT CHEST, ABDOMEN, AND PELVIS WITH CONTRAST  IMPRESSION: 1. A previously seen PET avid lesion of the anterior left lobe of the liver, hepatic segment II, is no longer discretely appreciable consistent with treatment response of a hepatic metastasis. 2. No evidence of new metastatic disease in the chest, abdomen, or pelvis. 3. Interval increase in a small focus of consolidation and nodularity of the medial left  upper lobe, consistent with minimal, ongoing atypical infection. Additional tiny bilateral pulmonary nodules are stable and almost certainly incidental benign. Attention on follow-up. 4. Status post right hemicolectomy and ileocolic anastomosis.      INTERVAL HISTORY:  Brittney Spencer is here for a follow up of metastatic rectal cancer. She was last seen by me on 12/13/21. She presents to the clinic alone. She did not tolerate FOLFIRI well two weeks ago. She lost her appetite with intermittent nausea, and mild diarrhea, and moderate fatigue. She still has not recovered well. No fever or chills. She lost about 2 lbs.    All other systems were reviewed with the patient and are negative.  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 Some residual intraretinal fluid remains, very small localized subfoveal of fluid remains although this continues to slowly resorb. We'll continue to observe.   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   Resolved March 2021 post vitrectomy    SURGICAL HISTORY: Past Surgical History:  Procedure Laterality Date   COLONOSCOPY  2018   [facility]   COLONSCOPY  12/2015   IR IMAGING GUIDED PORT INSERTION  08/04/2020   LAPAROSCOPIC RIGHT HEMI COLECTOMY Right 02/07/2016   Procedure: LAPAROSCOPIC ASSISTED RIGHT HEMI COLECTOMY AND RIGHT SALPINGO OOPHERECTOMY;  Surgeon: [name], MD;   Location: [facility];  Service: General;  Laterality: Right;   RETINAL DETACHMENT SURGERY  09/2019    I have reviewed the social history and family history with the patient and they are unchanged from previous note.  ALLERGIES:  is allergic to fish allergy, peanut-containing drug products, soy allergy, and buspirone.  MEDICATIONS:  Current Outpatient Medications  Medication Sig Dispense Refill   prochlorperazine (COMPAZINE) 10 MG tablet Take 1 tablet (10 mg total) by mouth every 6 (six) hours as needed for nausea or vomiting. 30 tablet 2   ALPRAZolam (XANAX) 0.25 MG tablet Take 1 tablet (0.25 mg total) by mouth daily as needed for anxiety. 30 tablet 0   Cholecalciferol (VITAMIN D3 PO) Take by mouth daily.     diphenoxylate-atropine (LOMOTIL) 2.5-0.025 MG tablet Take 2 tablets by mouth 4 (four) times daily as needed for diarrhea or loose stools. 45 tablet 3   docusate sodium (COLACE) 100 MG capsule 1 capsule as needed     lidocaine-prilocaine (EMLA) cream Apply 1 application topically as needed. 30 g 1   NORVASC 2.5 MG tablet TAKE 1 TABLET BY MOUTH EVERY DAY 90 tablet 1   ondansetron (ZOFRAN) 8 MG tablet Take 1 tablet (8 mg total) by mouth every 8 (eight) hours as needed for nausea or vomiting. 20 tablet 2   Current Facility-Administered Medications  Medication Dose Route Frequency Provider Last Rate Last Admin   0.9 %  sodium chloride infusion  500 mL Intravenous Once Danis, Estill Cotta III, MD        PHYSICAL EXAMINATION: ECOG PERFORMANCE STATUS: 2 - Symptomatic, <50% confined to bed  Vitals:   01/10/22 0905  BP: 126/80  Pulse: 87  Resp: 18  Temp: 98.4 F (36.9 C)  SpO2: 100%   Wt Readings from Last 3 Encounters:  01/10/22 110 lb 1.6 oz (49.9 kg)  12/27/21 111 lb 12.8 oz (50.7 kg)  12/13/21 113 lb 1.6 oz (51.3 kg)     GENERAL:alert, no distress and comfortable SKIN: skin color, texture, turgor are normal, no rashes or significant lesions EYES: normal, Conjunctiva are pink and  non-injected, sclera clear NECK: supple, thyroid normal size, non-tender, without nodularity LYMPH:  no palpable lymphadenopathy in the cervical, axillary  LUNGS: clear to auscultation and percussion with normal breathing effort HEART: regular rate & rhythm and no murmurs and no lower extremity edema ABDOMEN:abdomen soft, non-tender and normal bowel sounds Musculoskeletal:no cyanosis of digits and no clubbing  NEURO: alert & oriented x 3 with fluent speech, no focal motor/sensory deficits  LABORATORY DATA:  I have reviewed the data as listed    Latest Ref Rng & Units 01/10/2022    8:57 AM 12/27/2021    9:01 AM 12/13/2021    9:49 AM  CBC  WBC 4.0 - 10.5 K/uL 7.0  7.2  6.4   Hemoglobin 12.0 - 15.0 g/dL 9.4  9.5  9.6   Hematocrit 36.0 - 46.0 % 30.5  30.7  31.0   Platelets 150 - 400 K/uL 211  191  200         Latest Ref Rng & Units 01/10/2022    8:57 AM 12/27/2021    9:01 AM 12/13/2021    9:49 AM  CMP  Glucose 70 - 99 mg/dL 91  83  112   BUN 8 - 23 mg/dL '10  11  10   ' Creatinine 0.44 - 1.00 mg/dL 0.72  0.66  0.72   Sodium 135 - 145 mmol/L 138  137  138   Potassium 3.5 - 5.1 mmol/L 4.0  3.9  4.0   Chloride 98 - 111 mmol/L 106  105  106   CO2 22 - 32 mmol/L '27  27  28   ' Calcium 8.9 - 10.3 mg/dL 8.7  9.0  8.9   Total Protein 6.5 - 8.1 g/dL 6.5  6.6  6.6   Total Bilirubin 0.3 - 1.2 mg/dL 0.3  0.3  0.3   Alkaline Phos 38 - 126 U/L 90  87  86   AST 15 - 41 U/L '12  12  15   ' ALT 0 - 44 U/L '6  6  6       ' RADIOGRAPHIC STUDIES: I have personally reviewed the radiological images as listed and agreed with the findings in the report. No results found.    No orders of the defined types were placed in this encounter.  All questions were answered. The patient knows to call the clinic with any problems, questions or concerns. No barriers to learning was detected. The total time spent in the appointment was 30 minutes.     [name], MD 01/10/2022   I, Wilburn Mylar, am acting as  scribe for [name], MD.   I have reviewed the above documentation for accuracy and completeness, and I agree with the above.

## 2022-01-10 ENCOUNTER — Inpatient Hospital Stay: Payer: Federal, State, Local not specified - PPO

## 2022-01-10 ENCOUNTER — Inpatient Hospital Stay: Payer: Federal, State, Local not specified - PPO | Admitting: Hematology

## 2022-01-10 ENCOUNTER — Other Ambulatory Visit: Payer: Self-pay

## 2022-01-10 ENCOUNTER — Encounter: Payer: Self-pay | Admitting: Hematology

## 2022-01-10 VITALS — BP 126/80 | HR 87 | Temp 98.4°F | Resp 18 | Ht 66.0 in | Wt 110.1 lb

## 2022-01-10 DIAGNOSIS — C20 Malignant neoplasm of rectum: Secondary | ICD-10-CM

## 2022-01-10 DIAGNOSIS — C787 Secondary malignant neoplasm of liver and intrahepatic bile duct: Secondary | ICD-10-CM

## 2022-01-10 DIAGNOSIS — I10 Essential (primary) hypertension: Secondary | ICD-10-CM | POA: Diagnosis not present

## 2022-01-10 DIAGNOSIS — C182 Malignant neoplasm of ascending colon: Secondary | ICD-10-CM | POA: Diagnosis not present

## 2022-01-10 LAB — CBC WITH DIFFERENTIAL (CANCER CENTER ONLY)
Abs Immature Granulocytes: 0.03 10*3/uL (ref 0.00–0.07)
Basophils Absolute: 0 10*3/uL (ref 0.0–0.1)
Basophils Relative: 0 %
Eosinophils Absolute: 0.1 10*3/uL (ref 0.0–0.5)
Eosinophils Relative: 1 %
HCT: 30.5 % — ABNORMAL LOW (ref 36.0–46.0)
Hemoglobin: 9.4 g/dL — ABNORMAL LOW (ref 12.0–15.0)
Immature Granulocytes: 0 %
Lymphocytes Relative: 17 %
Lymphs Abs: 1.2 10*3/uL (ref 0.7–4.0)
MCH: 25.6 pg — ABNORMAL LOW (ref 26.0–34.0)
MCHC: 30.8 g/dL (ref 30.0–36.0)
MCV: 83.1 fL (ref 80.0–100.0)
Monocytes Absolute: 0.6 10*3/uL (ref 0.1–1.0)
Monocytes Relative: 9 %
Neutro Abs: 5 10*3/uL (ref 1.7–7.7)
Neutrophils Relative %: 73 %
Platelet Count: 211 10*3/uL (ref 150–400)
RBC: 3.67 MIL/uL — ABNORMAL LOW (ref 3.87–5.11)
RDW: 16.6 % — ABNORMAL HIGH (ref 11.5–15.5)
WBC Count: 7 10*3/uL (ref 4.0–10.5)
nRBC: 0 % (ref 0.0–0.2)

## 2022-01-10 LAB — CEA (IN HOUSE-CHCC): CEA (CHCC-In House): 17.51 ng/mL — ABNORMAL HIGH (ref 0.00–5.00)

## 2022-01-10 LAB — CMP (CANCER CENTER ONLY)
ALT: 6 U/L (ref 0–44)
AST: 12 U/L — ABNORMAL LOW (ref 15–41)
Albumin: 3.6 g/dL (ref 3.5–5.0)
Alkaline Phosphatase: 90 U/L (ref 38–126)
Anion gap: 5 (ref 5–15)
BUN: 10 mg/dL (ref 8–23)
CO2: 27 mmol/L (ref 22–32)
Calcium: 8.7 mg/dL — ABNORMAL LOW (ref 8.9–10.3)
Chloride: 106 mmol/L (ref 98–111)
Creatinine: 0.72 mg/dL (ref 0.44–1.00)
GFR, Estimated: >60 mL/min (ref >60)
Glucose, Bld: 91 mg/dL (ref 70–99)
Potassium: 4 mmol/L (ref 3.5–5.1)
Sodium: 138 mmol/L (ref 135–145)
Total Bilirubin: 0.3 mg/dL (ref 0.3–1.2)
Total Protein: 6.5 g/dL (ref 6.5–8.1)

## 2022-01-10 MED ORDER — PROCHLORPERAZINE MALEATE 10 MG PO TABS: 10.0000 mg | ORAL_TABLET | Freq: Four times a day (QID) | ORAL | 2 refills | Status: DC | PRN

## 2022-01-12 ENCOUNTER — Inpatient Hospital Stay: Payer: Federal, State, Local not specified - PPO

## 2022-01-21 ENCOUNTER — Other Ambulatory Visit: Payer: Self-pay

## 2022-01-23 ENCOUNTER — Other Ambulatory Visit: Payer: Self-pay

## 2022-01-23 ENCOUNTER — Other Ambulatory Visit: Payer: Self-pay | Admitting: Nurse Practitioner

## 2022-01-23 DIAGNOSIS — C20 Malignant neoplasm of rectum: Secondary | ICD-10-CM

## 2022-01-23 MED FILL — Fosaprepitant Dimeglumine For IV Infusion 150 MG (Base Eq): INTRAVENOUS | Qty: 5 | Status: AC

## 2022-01-23 MED FILL — Dexamethasone Sodium Phosphate Inj 100 MG/10ML: INTRAMUSCULAR | Qty: 1 | Status: AC

## 2022-01-23 NOTE — Progress Notes (Unsigned)
[facility]   Telephone:([phone]) [phone] [phone]) [phone]   Clinic Follow up Note   Patient Care Team: Patient, No Pcp Per as PCP - General (General Practice) [name], NP as Nurse Practitioner (Oncology) [name], MD as Consulting Physician (Oncology) [name], RN (Inactive) as Oncology Nurse Navigator 01/24/2022  CHIEF COMPLAINT: Follow-up metastatic rectal cancer, history of colon cancer  SUMMARY OF ONCOLOGIC HISTORY: Oncology History Overview Note  Cancer Staging Cancer of ascending colon [facility]) Staging form: Colon and Rectum, AJCC 7th Edition - Clinical stage from 02/07/2016: Stage IIIC (T4b, N1b, M0) - Signed by [name], MD on 03/04/2016 Laterality: Right Residual tumor (R): R2 - Macroscopic    Cancer of ascending colon ([place])  10/25/2015 Imaging   CT ABD/PELVIS:  Inflammatory changes inferior to the cecal tip appear improved, there is still irregular soft tissue thickening of the cecal tip, and there are adjacent prominent lymph nodes in the ileocolonic mesentery, measuring 13 mm on image 49 and 8 mm on image 52. In addition, there is a 2.5 x 1.8 cm nodule on image 46 which has central low density. Therefore, these findings are moderately suspicious for an underlying cecal malignancy with perforation.    01/19/2016 Procedure   COLONOSCOPY per Dr. [name]: Fungating, ulcerated mass almost obstructing mid ascending colon   01/19/2016 Initial Biopsy   Diagnosis Surgical [P], cecal mass - INVASIVE ADENOCARCINOMA WITH ULCERATION. - SEE COMMENT.   02/05/2016 Tumor Marker   Patient's tumor was tested for the following markers: CEA Results of the tumor marker test revealed 5.7.   02/07/2016 Initial Diagnosis   Cancer of ascending colon ([place])   02/07/2016 Definitive Surgery   Laparoscopic assisted right hemicolectomy and right salpingo oopherectomy--Dr. [name]   02/07/2016 Pathologic Stage   p T4 N1b   2/43 nodes +   02/07/2016 Pathology Results    MMR normal; G2 adenocarcinoma;proximal & distal margins negative; soft tissue mass on pelvic sidewall + for adenocarcinoma with positive margin MSI Stable   03/08/2016 Imaging   CT chest negative for metastasis.    03/19/2016 - 04/25/2016 Radiation Therapy   Adjuvant irradiation, 50 gray in 28 fractions   03/19/2016 - 04/22/2016 Chemotherapy   Xeloda 1500 mg twice daily, started on 03/19/2016, dose reduced to 1000 mg twice daily from week 3 due to neutropenia, and patient stopped 3 days before last dose radiation due to difficulty swallowing the pill    05/20/2016 -  Adjuvant Chemotherapy   Patient declined adjuvant chemotherapy   09/16/2016 Imaging   CT CAP w Contrast 1. No evidence of local tumor recurrence at the ileocolic anastomosis. 2. No findings suspicious for metastatic disease in the chest, abdomen or pelvis. 3. Nonspecific trace free fluid in the pelvic cul-de-sac. 4. Stable solitary 3 mm right upper lobe pulmonary nodule, for which 6 month stability has been demonstrated, probably benign. 5. Additional findings include stable right posterior pericardial cyst and small calcified uterine fibroids.   05/13/2017 Imaging   CT CAP W Contrast 05/13/17 IMPRESSION: 1. No current findings of residual or recurrent malignancy. 2. Mild prominence of stool throughout the colon. Nondistended portions of the rectum. 3. Several tiny pulmonary nodules are stable from the earliest available comparison of 03/08/2016 and probably benign, but may merit surveillance. 4. Other imaging findings of potential clinical significance: Old granulomatous disease. Aortoiliac atherosclerotic vascular disease. Lumbar spondylosis and degenerative disc disease. Stable amount of trace free pelvic fluid.   04/27/2018 Imaging   04/27/2018 CT CAP IMPRESSION:  Stable exam. No evidence of recurrent or metastatic carcinoma within the chest, abdomen, or pelvis   04/19/2019 Imaging   CT CAP W Contrast   IMPRESSION: Chest Impression:   1. No evidence of thoracic metastasis. 2. Stable small bilateral pulmonary nodules.   Abdomen / Pelvis Impression:   1. No evidence local colorectal carcinoma recurrence or metastasis in the abdomen pelvis. 2. Post RIGHT hemicolectomy.   01/22/2021 Imaging   CT CAP  IMPRESSION: CT CHEST IMPRESSION   1. Similar nonspecific pulmonary nodules. 2. New posterior left upper lobe reticulonodular opacity, suspicious for interval mild infection or inflammation. 3. No thoracic adenopathy.   CT ABDOMEN AND PELVIS IMPRESSION   1. Further decrease in size of high left hepatic lobe 3 mm low-density lesion. No new or progressive metastatic disease within the abdomen or pelvis. 2. Similar trace free pelvic fluid. 3. Similar nonspecific mid rectal wall thickening. 4.  Aortic Atherosclerosis (ICD10-I70.0).   04/23/2021 Imaging   CT CAP  IMPRESSION: 1. Treated metastatic lesion between segments 2 and 3 of the liver, slightly smaller and less distinct than prior examination. No other signs of definite metastatic disease elsewhere in the abdomen or pelvis. 2. Multiple small pulmonary nodules, stable compared to the prior examination, favored to be benign. No definitive findings to suggest metastatic disease to the thorax. 3. Aortic atherosclerosis. 4. Additional incidental findings, as above.   08/13/2021 Imaging   EXAM: CT CHEST, ABDOMEN, AND PELVIS WITH CONTRAST  IMPRESSION: 1. A previously seen PET avid lesion of the anterior left lobe of the liver, hepatic segment II, is no longer discretely appreciable consistent with treatment response of a hepatic metastasis. 2. No evidence of new metastatic disease in the chest, abdomen, or pelvis. 3. Interval increase in a small focus of consolidation and nodularity of the medial left upper lobe, consistent with minimal, ongoing atypical infection. Additional tiny bilateral pulmonary nodules are stable and  almost certainly incidental benign. Attention on follow-up. 4. Status post right hemicolectomy and ileocolic anastomosis.   Rectal cancer metastasized to liver ([name])  06/15/2020 Procedure   Screening Colonoscopy by Dr [name]  IMPRESSION - Decreased sphincter tone and internal hemorrhoids that prolapse with straining, but require manual replacement into the anal canal (Grade III) found on digital rectal exam. - Patent side-to-side ileo-colonic anastomosis, characterized by healthy appearing mucosa. - The examined portion of the ileum was normal. - One diminutive polyp in the proximal transverse colon, removed with a cold biopsy forceps. Resected and retrieved. - Likely malignant partially obstructing tumor in the mid rectum. Biopsied. Tattooed. - The examination was otherwise normal on direct and retroflexion views.   06/15/2020 Initial Biopsy   Diagnosis 1. Transverse Colon Polyp - HYPERPLASTIC POLYP 2. Rectum, biopsy - ADENOCARCINOMA ARISING IN A TUBULAR ADENOMA WITH HIGH-GRADE DYSPLASIA. SEE NOTE Diagnosis Note 2. Dr. [name] reviewed the case and concurs with the diagnosis. Dr. [name] was notified on 06/16/2020.   06/28/2020 Imaging   CT CAP  IMPRESSION: 1. New low-density focus in the anterior aspect of the lateral segment LEFT hepatic lobe measuring 1.2 x 1.0 cm, compatible with small metastatic lesion in the LEFT hepatic lobe. 2. Soft tissue in the RIGHT iliac fossa following RIGHT hemicolectomy invades the psoas musculature and is slowly enlarging over time, more linear on the prior study now highly concerning for recurrence/metastasis to this location. 3. Signs of enteritis, potentially post radiation changes of the small bowel. Tethered small bowel in the RIGHT lower quadrant shows focal thickening and narrowing suspicious  for small bowel involvement and developing partial obstruction though currently contrast passes beyond this point into the colon. 4. Rectal  thickening in this patient with known rectal mass as described. 5. No evidence of metastatic disease in the chest. 6. Stable small pulmonary nodules. 7.  and aortic atherosclerosis.   Aortic Atherosclerosis (ICD10-I70.0) and Emphysema (ICD10-J43.9).   07/04/2020 Initial Diagnosis   Rectal cancer metastasized to liver ([place])   07/12/2020 PET scan   IMPRESSION: 1. Exam positive for FDG avid rectal tumor which corresponds to the recent colonoscopy findings. 2. FDG avid soft tissue mass within the right iliac fossa is noted and consistent with local tumor recurrence from previous ascending colon tumor. 3. Lateral segment left lobe of liver lesion is FDG avid concerning for liver metastasis. 4. No specific findings identified to suggest metastatic disease to the chest.   08/10/2020 -  Chemotherapy   First-line FOLFIRI q2weeks starting 08/10/20. dose reduced with cycle 1. Irinotecan/5FU increased and Bevacizumab added with cycle 2 on 08/23/2020    10/27/2020 Imaging   CT CAP  IMPRESSION: 1. Interval decrease in size of the hypermetabolic left hepatic lesion, consistent with metastatic disease. No new liver lesion evident. 2. Interval resolution of the hypermetabolic soft tissue lesion along the right iliac fossa with no measurable soft tissue lesion remaining at this location today. 3. Similar appearance of soft tissue fullness in the rectum at the site of the hypermetabolic lesion seen previously. 4. Stable tiny bilateral pulmonary nodules. Continued attention on follow-up recommended. 5. Small volume free fluid in the pelvis. 6. Aortic Atherosclerosis (ICD10-I70.0).   01/22/2021 Imaging   CT CAP  IMPRESSION: CT CHEST IMPRESSION   1. Similar nonspecific pulmonary nodules. 2. New posterior left upper lobe reticulonodular opacity, suspicious for interval mild infection or inflammation. 3. No thoracic adenopathy.   CT ABDOMEN AND PELVIS IMPRESSION   1. Further decrease in size  of high left hepatic lobe 3 mm low-density lesion. No new or progressive metastatic disease within the abdomen or pelvis. 2. Similar trace free pelvic fluid. 3. Similar nonspecific mid rectal wall thickening. 4.  Aortic Atherosclerosis (ICD10-I70.0).   04/23/2021 Imaging   CT CAP  IMPRESSION: 1. Treated metastatic lesion between segments 2 and 3 of the liver, slightly smaller and less distinct than prior examination. No other signs of definite metastatic disease elsewhere in the abdomen or pelvis. 2. Multiple small pulmonary nodules, stable compared to the prior examination, favored to be benign. No definitive findings to suggest metastatic disease to the thorax. 3. Aortic atherosclerosis. 4. Additional incidental findings, as above.   08/13/2021 Imaging   EXAM: CT CHEST, ABDOMEN, AND PELVIS WITH CONTRAST  IMPRESSION: 1. A previously seen PET avid lesion of the anterior left lobe of the liver, hepatic segment II, is no longer discretely appreciable consistent with treatment response of a hepatic metastasis. 2. No evidence of new metastatic disease in the chest, abdomen, or pelvis. 3. Interval increase in a small focus of consolidation and nodularity of the medial left upper lobe, consistent with minimal, ongoing atypical infection. Additional tiny bilateral pulmonary nodules are stable and almost certainly incidental benign. Attention on follow-up. 4. Status post right hemicolectomy and ileocolic anastomosis.     CURRENT THERAPY: FOLFIRI and bevacizumab, every 2 weeks, restarted 12/27/2021, cycle 2 postponed with dose reduction   INTERVAL HISTORY: Ms. Michna returns for follow-up as scheduled, last seen by Dr. Annamaria Boots 01/10/2022, chemo was held for significant fatigue and low appetite.  She began to feel better a week  later, with more intake and more activity at home.  She is very mindful of what she is eating and how she is feeling.  Denies new or worsening pain, recent fever,  chills, new cough, chest pain, leg edema, neuropathy.  Her focus is quality of life and wants to continue treatment. The plan is to resume today with 15% dose reduction.  All other systems were reviewed with the patient and are negative.  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 Some residual intraretinal fluid remains, very small localized subfoveal of fluid remains although this continues to slowly resorb. We'll continue to observe.   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  ALLERGIES:  is allergic to fish allergy, peanut-containing drug products, soy allergy, and buspirone.  MEDICATIONS:  Current Outpatient Medications  Medication Sig Dispense Refill   ALPRAZolam (XANAX) 0.25 MG tablet Take 1 tablet (0.25 mg total)  by mouth daily as needed for anxiety. 30 tablet 0   Cholecalciferol (VITAMIN D3 PO) Take by mouth daily.     diphenoxylate-atropine (LOMOTIL) 2.5-0.025 MG tablet Take 2 tablets by mouth 4 (four) times daily as needed for diarrhea or loose stools. 45 tablet 3   docusate sodium (COLACE) 100 MG capsule 1 capsule as needed     lidocaine-prilocaine (EMLA) cream Apply 1 application topically as needed. 30 g 1   NORVASC 2.5 MG tablet Take 1 tablet (2.5 mg total) by mouth daily. 90 tablet 1   ondansetron (ZOFRAN) 8 MG tablet Take 1 tablet (8 mg total) by mouth every 8 (eight) hours as needed for nausea or vomiting. 20 tablet 2   prochlorperazine (COMPAZINE) 10 MG tablet Take 1 tablet (10 mg total) by mouth every 6 (six) hours as needed for nausea or vomiting. 30 tablet 2   Current Facility-Administered Medications  Medication Dose Route Frequency Provider Last Rate Last Admin   0.9 %  sodium chloride infusion  500 mL Intravenous Once Doran Stabler, MD       Facility-Administered Medications Ordered in Other Visits  Medication Dose Route Frequency Provider Last Rate Last Admin   0.9 %  sodium chloride infusion   Intravenous Once [name], MD  atropine injection 0.5 mg  0.5 mg Intravenous Once PRN [name], MD       bevacizumab-awwb (MVASI) 250 mg in sodium chloride 0.9 % 100 mL chemo infusion  5 mg/kg (Treatment Plan Recorded) Intravenous Once [name], MD       dexamethasone (DECADRON) 10 mg in sodium chloride 0.9 % 50 mL IVPB  10 mg Intravenous Once [name], MD       fluorouracil (ADRUCIL) 2,750 mg in sodium chloride 0.9 % 95 mL chemo infusion  1,800 mg/m2 (Treatment Plan Recorded) Intravenous 1 day or 1 dose [name], MD       fosaprepitant (EMEND) 150 mg in sodium chloride 0.9 % 145 mL IVPB  150 mg Intravenous Once [name], MD       irinotecan (CAMPTOSAR) 160 mg in sodium chloride 0.9 % 500 mL chemo infusion  110 mg/m2 (Treatment Plan Recorded) Intravenous Once [name], MD        leucovorin 616 mg in sodium chloride 0.9 % 250 mL infusion  400 mg/m2 (Treatment Plan Recorded) Intravenous Once [name], MD       palonosetron (ALOXI) injection 0.25 mg  0.25 mg Intravenous Once [name], MD        PHYSICAL EXAMINATION: ECOG PERFORMANCE STATUS: 1 - Symptomatic but completely ambulatory  Vitals:   01/24/22 0913  BP: (!) 141/80  Pulse: 83  Resp: 15  Temp: 98.7 F (37.1 C)  SpO2: 100%   Filed Weights   01/24/22 0913  Weight: 110 lb 3.2 oz (50 kg)    GENERAL:alert, no distress and comfortable SKIN: No rash EYES: sclera clear LUNGS:  normal breathing effort HEART: no lower extremity edema NEURO: alert & oriented x 3 with fluent speech, no focal motor/sensory deficits PAC without erythema  LABORATORY DATA:  I have reviewed the data as listed    Latest Ref Rng & Units 01/24/2022    8:38 AM 01/10/2022    8:57 AM 12/27/2021    9:01 AM  CBC  WBC 4.0 - 10.5 K/uL 3.1  7.0  7.2   Hemoglobin 12.0 - 15.0 g/dL 9.4  9.4  9.5   Hematocrit 36.0 - 46.0 % 30.5  30.5  30.7   Platelets 150 - 400 K/uL 205  211  191         Latest Ref Rng & Units 01/24/2022    8:38 AM 01/10/2022    8:57 AM 12/27/2021    9:01 AM  CMP  Glucose 70 - 99 mg/dL 96  91  83   BUN 8 - 23 mg/dL $Remove'14  10  11   'NJCPKJF$ Creatinine 0.44 - 1.00 mg/dL 0.70  0.72  0.66   Sodium 135 - 145 mmol/L 138  138  137   Potassium 3.5 - 5.1 mmol/L 3.8  4.0  3.9   Chloride 98 - 111 mmol/L 105  106  105   CO2 22 - 32 mmol/L $RemoveB'29  27  27   'XrkiRwzI$ Calcium 8.9 - 10.3 mg/dL 8.7  8.7  9.0   Total Protein 6.5 - 8.1 g/dL 6.8  6.5  6.6   Total Bilirubin 0.3 - 1.2 mg/dL 0.4  0.3  0.3   Alkaline Phos 38 - 126 U/L 69  90  87   AST 15 - 41 U/L $Remo'14  12  12   'YoqLr$ ALT 0 - 44 U/L $Remo'5  6  6       'oNWmI$ RADIOGRAPHIC STUDIES: I have personally reviewed the radiological images as listed  and agreed with the findings in the report. No results found.   ASSESSMENT & PLAN: DARRIS CARACHURE is a 71 y.o. female with   1. Rectal adenocarcinoma, with liver  and right pelvic metastasis, recurrence from previous colon cancer vs new primary   -Diagnosed on routine screening colonoscopy 05/2020, rectal mass biopsy showed invasive adenocarcinoma, arising from a tubular adenoma -Her 07/12/20 PET showed positive uptake of known rectal tumor and soft tissue mass within the right iliac fossa indicating recurrent prior colon cancer. There is also left lobe of liver lesion is FDG avid concerning for liver metastasis. pt declined liver biopsy -whether this is metastatic rectal or recurrent/metastatic colon cancer, this is likely not curable, but still treatable. She previously declined referral for HIPEC -She began first-line systemic chemo with dose-reduced FOLFIRI on 08/06/20, Bevacizumab added, and irinotecan/5FU increased with cycle 2, but due to poor tolerance/diarrhea irinotecan was reduced with cycle 3, tolerating well. -She changed to maintenance therapy with irinotecan and bevacizumab every 2 weeks on 01/24/2021 -CEA began rising, CT CAP 12/10/2021 showed new 1.5 cm right hepatic lobe metastasis, and stable soft tissue thickening in right iliac fossa and pulmonary nodules -She restarted FOLFIRI/Beva with G-CSF on 12/27/2021.  Cycle 2 postponed due to poor recovery, low appetite and fatigue -Ms. Melchior appears well.  She has recovered better, eating more with improved performance status.  We discussed her goals of care.  While her main focus is quality of life she wants to continue treatment for now. -Labs reviewed, ANC 1.6, otherwise stable.  CEA is pending. She will proceed with cycle 2 FOLFIRI/Beva today as planned with dose reduced irinotecan and 5-FU.  G-CSF on day 3 -She knows to call if she does not tolerate this cycle well.  Follow-up in 2 weeks with cycle 3   2. Cancer of ascending colon, [id number], stage IIIC, MSI-stable, (+) surgical margins at pelvic wall     -She was diagnosed in 12/2015. She is s/p right hemicolectomy with right salpingo oophorectomy  and  adjuvant ChemoRT. -she declined adjuvant chemo due to the concern of side effects and impact on her quality of life. -Her 04/2019 CT scan was NED  -With recent rectal cancer diagnosis, her PET from 07/12/20 indicates soft tissue mass within the right iliac fossa is noted and consistent with local tumor recurrence from previous ascending colon tumor. -She declined option of liver biopsy.     3. Mild Anemia -Has been stable and mild for the past year. Worse with rectal cancer and starting chemo -denies bleeding -stable in 9 range; monitoring    4. HTN, Anxiety -She'll follow-up with her primary care physician and continue medication -She uses half tab Xanax as needed, mostly only on treatment days. refilled -stable, refilled   PLAN: -labs reviewed, CEA is pending -proceed with C2 FOLFIRI/Beva today, dose reduced irinotecan and 5FU -GCSF on day 3 -f/up in 2 weeks with cycle 3, or sooner if she does not tolerate well    All questions were answered. The patient knows to call the clinic with any problems, questions or concerns. No barriers to learning was detected. I spent 20 minutes counseling the patient face to face. The total time spent in the appointment was 30 minutes and more than 50% was on counseling and review of test results.     [name], NP 01/24/22

## 2022-01-23 NOTE — Progress Notes (Signed)
The following biosimilar Mvasi (bevacizumab-awwb) has been selected for use in this patient.  Kennith Center, Pharm.D., CPP 01/23/2022'@8'$ :50 AM

## 2022-01-24 ENCOUNTER — Other Ambulatory Visit: Payer: Self-pay

## 2022-01-24 ENCOUNTER — Encounter: Payer: Self-pay | Admitting: Nurse Practitioner

## 2022-01-24 ENCOUNTER — Inpatient Hospital Stay: Payer: Federal, State, Local not specified - PPO

## 2022-01-24 ENCOUNTER — Inpatient Hospital Stay (HOSPITAL_BASED_OUTPATIENT_CLINIC_OR_DEPARTMENT_OTHER): Payer: Federal, State, Local not specified - PPO | Admitting: Nurse Practitioner

## 2022-01-24 VITALS — BP 141/80 | HR 83 | Temp 98.7°F | Resp 15 | Wt 110.2 lb

## 2022-01-24 DIAGNOSIS — C20 Malignant neoplasm of rectum: Secondary | ICD-10-CM | POA: Diagnosis not present

## 2022-01-24 DIAGNOSIS — C182 Malignant neoplasm of ascending colon: Secondary | ICD-10-CM | POA: Diagnosis not present

## 2022-01-24 DIAGNOSIS — C787 Secondary malignant neoplasm of liver and intrahepatic bile duct: Secondary | ICD-10-CM

## 2022-01-24 DIAGNOSIS — Z95828 Presence of other vascular implants and grafts: Secondary | ICD-10-CM

## 2022-01-24 LAB — CBC WITH DIFFERENTIAL/PLATELET
Abs Immature Granulocytes: 0.01 10*3/uL (ref 0.00–0.07)
Basophils Absolute: 0 10*3/uL (ref 0.0–0.1)
Basophils Relative: 1 %
Eosinophils Absolute: 0.1 10*3/uL (ref 0.0–0.5)
Eosinophils Relative: 5 %
HCT: 30.5 % — ABNORMAL LOW (ref 36.0–46.0)
Hemoglobin: 9.4 g/dL — ABNORMAL LOW (ref 12.0–15.0)
Immature Granulocytes: 0 %
Lymphocytes Relative: 29 %
Lymphs Abs: 0.9 10*3/uL (ref 0.7–4.0)
MCH: 25.5 pg — ABNORMAL LOW (ref 26.0–34.0)
MCHC: 30.8 g/dL (ref 30.0–36.0)
MCV: 82.9 fL (ref 80.0–100.0)
Monocytes Absolute: 0.5 10*3/uL (ref 0.1–1.0)
Monocytes Relative: 15 %
Neutro Abs: 1.6 10*3/uL — ABNORMAL LOW (ref 1.7–7.7)
Neutrophils Relative %: 50 %
Platelets: 205 10*3/uL (ref 150–400)
RBC: 3.68 MIL/uL — ABNORMAL LOW (ref 3.87–5.11)
RDW: 16.3 % — ABNORMAL HIGH (ref 11.5–15.5)
WBC: 3.1 10*3/uL — ABNORMAL LOW (ref 4.0–10.5)
nRBC: 0 % (ref 0.0–0.2)

## 2022-01-24 LAB — COMPREHENSIVE METABOLIC PANEL
ALT: 5 U/L (ref 0–44)
AST: 14 U/L — ABNORMAL LOW (ref 15–41)
Albumin: 3.6 g/dL (ref 3.5–5.0)
Alkaline Phosphatase: 69 U/L (ref 38–126)
Anion gap: 4 — ABNORMAL LOW (ref 5–15)
BUN: 14 mg/dL (ref 8–23)
CO2: 29 mmol/L (ref 22–32)
Calcium: 8.7 mg/dL — ABNORMAL LOW (ref 8.9–10.3)
Chloride: 105 mmol/L (ref 98–111)
Creatinine, Ser: 0.7 mg/dL (ref 0.44–1.00)
GFR, Estimated: >60 mL/min (ref >60)
Glucose, Bld: 96 mg/dL (ref 70–99)
Potassium: 3.8 mmol/L (ref 3.5–5.1)
Sodium: 138 mmol/L (ref 135–145)
Total Bilirubin: 0.4 mg/dL (ref 0.3–1.2)
Total Protein: 6.8 g/dL (ref 6.5–8.1)

## 2022-01-24 LAB — CEA (IN HOUSE-CHCC): CEA (CHCC-In House): 16.83 ng/mL — ABNORMAL HIGH (ref 0.00–5.00)

## 2022-01-24 LAB — TOTAL PROTEIN, URINE DIPSTICK: Protein, ur: NEGATIVE mg/dL

## 2022-01-24 MED ORDER — ALPRAZOLAM 0.25 MG PO TABS: 0.2500 mg | ORAL_TABLET | Freq: Every day | ORAL | 0 refills | Status: DC | PRN

## 2022-01-24 MED ORDER — PALONOSETRON HCL INJECTION 0.25 MG/5ML
0.2500 mg | Freq: Once | INTRAVENOUS | Status: AC
  Administered 2022-01-24: 0.25 mg via INTRAVENOUS
  Filled 2022-01-24: qty 5

## 2022-01-24 MED ORDER — SODIUM CHLORIDE 0.9 % IV SOLN
10.0000 mg | Freq: Once | INTRAVENOUS | Status: AC
  Administered 2022-01-24: 10 mg via INTRAVENOUS
  Filled 2022-01-24: qty 10

## 2022-01-24 MED ORDER — SODIUM CHLORIDE 0.9 % IV SOLN
1800.0000 mg/m2 | INTRAVENOUS | Status: DC
  Administered 2022-01-24: 2750 mg via INTRAVENOUS
  Filled 2022-01-24: qty 55

## 2022-01-24 MED ORDER — SODIUM CHLORIDE 0.9 % IV SOLN
150.0000 mg | Freq: Once | INTRAVENOUS | Status: AC
  Administered 2022-01-24: 150 mg via INTRAVENOUS
  Filled 2022-01-24: qty 150

## 2022-01-24 MED ORDER — ATROPINE SULFATE 1 MG/ML IV SOLN
0.5000 mg | Freq: Once | INTRAVENOUS | Status: AC | PRN
  Administered 2022-01-24: 0.5 mg via INTRAVENOUS
  Filled 2022-01-24: qty 1

## 2022-01-24 MED ORDER — SODIUM CHLORIDE 0.9 % IV SOLN: Freq: Once | INTRAVENOUS | Status: AC

## 2022-01-24 MED ORDER — SODIUM CHLORIDE 0.9 % IV SOLN
5.0000 mg/kg | Freq: Once | INTRAVENOUS | Status: AC
  Administered 2022-01-24: 250 mg via INTRAVENOUS
  Filled 2022-01-24: qty 10

## 2022-01-24 MED ORDER — NORVASC 2.5 MG PO TABS: 2.5000 mg | ORAL_TABLET | Freq: Every day | ORAL | 1 refills | Status: DC

## 2022-01-24 MED ORDER — SODIUM CHLORIDE 0.9% FLUSH
10.0000 mL | Freq: Once | INTRAVENOUS | Status: AC
  Administered 2022-01-24: 10 mL

## 2022-01-24 MED ORDER — SODIUM CHLORIDE 0.9 % IV SOLN
400.0000 mg/m2 | Freq: Once | INTRAVENOUS | Status: AC
  Administered 2022-01-24: 616 mg via INTRAVENOUS
  Filled 2022-01-24: qty 30.8

## 2022-01-24 MED ORDER — SODIUM CHLORIDE 0.9 % IV SOLN
110.0000 mg/m2 | Freq: Once | INTRAVENOUS | Status: AC
  Administered 2022-01-24: 160 mg via INTRAVENOUS
  Filled 2022-01-24: qty 8

## 2022-01-24 NOTE — Patient Instructions (Addendum)
[facility]  Discharge Instructions: Thank you for choosing [facility] to provide your oncology and hematology care.   If you have a lab appointment with the [facility], please go directly to the [facility] and check in at the registration area.   Wear comfortable clothing and clothing appropriate for easy access to any Portacath or PICC line.   We strive to give you quality time with your provider. You may need to reschedule your appointment if you arrive late (15 or more minutes).  Arriving late affects you and other patients whose appointments are after yours.  Also, if you miss three or more appointments without notifying the office, you may be dismissed from the clinic at the provider's discretion.      For prescription refill requests, have your pharmacy contact our office and allow 72 hours for refills to be completed.    Today you received the following chemotherapy and/or immunotherapy agents: Bevacizumab, Irinotecan, Leucovorin, Fluorouracil.       To help prevent nausea and vomiting after your treatment, we encourage you to take your nausea medication as directed.  BELOW ARE SYMPTOMS THAT SHOULD BE REPORTED IMMEDIATELY: *FEVER GREATER THAN 100.4 F (38 C) OR HIGHER *CHILLS OR SWEATING *NAUSEA AND VOMITING THAT IS NOT CONTROLLED WITH YOUR NAUSEA MEDICATION *UNUSUAL SHORTNESS OF BREATH *UNUSUAL BRUISING OR BLEEDING *URINARY PROBLEMS (pain or burning when urinating, or frequent urination) *BOWEL PROBLEMS (unusual diarrhea, constipation, pain near the anus) TENDERNESS IN MOUTH AND THROAT WITH OR WITHOUT PRESENCE OF ULCERS (sore throat, sores in mouth, or a toothache) UNUSUAL RASH, SWELLING OR PAIN  UNUSUAL VAGINAL DISCHARGE OR ITCHING   Items with * indicate a potential emergency and should be followed up as soon as possible or go to the Emergency Department if any problems should occur.  Please show the CHEMOTHERAPY ALERT CARD  or IMMUNOTHERAPY ALERT CARD at check-in to the Emergency Department and triage nurse.  Should you have questions after your visit or need to cancel or reschedule your appointment, please contact [facility]  Dept: [phone]  and follow the prompts.  Office hours are 8:00 a.m. to 4:30 p.m. Monday - Friday. Please note that voicemails left after 4:00 p.m. may not be returned until the following business day.  We are closed weekends and major holidays. You have access to a nurse at all times for urgent questions. Please call the main number to the clinic Dept: [phone] and follow the prompts.   For any non-urgent questions, you may also contact your provider using MyChart. We now offer e-Visits for anyone 15 and older to request care online for non-urgent symptoms. For details visit [email or web address].   Also download the MyChart app! Go to the app store, search "MyChart", open the app, select [facility], and log in with your MyChart username and password.  Masks are optional in the cancer centers. If you would like for your care team to wear a mask while they are taking care of you, please let them know. For doctor visits, patients may have with them one support person who is at least 73 years old. At this time, visitors are not allowed in the infusion area.

## 2022-01-26 ENCOUNTER — Other Ambulatory Visit: Payer: Self-pay

## 2022-01-26 ENCOUNTER — Inpatient Hospital Stay: Payer: Federal, State, Local not specified - PPO

## 2022-01-26 VITALS — BP 140/78 | HR 85 | Temp 97.8°F

## 2022-01-26 DIAGNOSIS — C787 Secondary malignant neoplasm of liver and intrahepatic bile duct: Secondary | ICD-10-CM

## 2022-01-26 DIAGNOSIS — C182 Malignant neoplasm of ascending colon: Secondary | ICD-10-CM | POA: Diagnosis not present

## 2022-01-26 MED ORDER — SODIUM CHLORIDE 0.9% FLUSH
10.0000 mL | INTRAVENOUS | Status: DC | PRN
  Administered 2022-01-26: 10 mL

## 2022-01-26 MED ORDER — HEPARIN SOD (PORK) LOCK FLUSH 100 UNIT/ML IV SOLN
500.0000 [IU] | Freq: Once | INTRAVENOUS | Status: AC | PRN
  Administered 2022-01-26: 500 [IU]

## 2022-01-26 MED ORDER — PEGFILGRASTIM-CBQV 6 MG/0.6ML ~~LOC~~ SOSY
6.0000 mg | PREFILLED_SYRINGE | Freq: Once | SUBCUTANEOUS | Status: AC
  Administered 2022-01-26: 6 mg via SUBCUTANEOUS
  Filled 2022-01-26: qty 0.6

## 2022-01-28 ENCOUNTER — Telehealth: Payer: Self-pay | Admitting: Nurse Practitioner

## 2022-01-28 ENCOUNTER — Telehealth: Payer: Self-pay

## 2022-01-28 NOTE — Telephone Encounter (Signed)
This nurse spoke with patient and made her aware of lab results and provider recommendations mentioned below.  Patient acknowledged understanding.  No further questions or concerns noted at this time.

## 2022-01-28 NOTE — Telephone Encounter (Signed)
-----   Message from [name], NP sent at 01/27/2022  7:44 PM EDT ----- Please call Ms. Kopplin to let her know CEA is stable from last draw. We anticipated this, given that she was off chemo for a month. We'll continue monitoring.  Thanks, Regan Rakers NP

## 2022-01-28 NOTE — Telephone Encounter (Signed)
Scheduled follow-up appointments per 7/27 los. Patient is aware. 

## 2022-01-29 ENCOUNTER — Other Ambulatory Visit: Payer: Self-pay

## 2022-02-06 MED FILL — Dexamethasone Sodium Phosphate Inj 100 MG/10ML: INTRAMUSCULAR | Qty: 1 | Status: AC

## 2022-02-06 MED FILL — Fosaprepitant Dimeglumine For IV Infusion 150 MG (Base Eq): INTRAVENOUS | Qty: 5 | Status: AC

## 2022-02-07 ENCOUNTER — Inpatient Hospital Stay: Payer: Federal, State, Local not specified - PPO

## 2022-02-07 ENCOUNTER — Other Ambulatory Visit: Payer: Self-pay

## 2022-02-07 ENCOUNTER — Inpatient Hospital Stay: Payer: Federal, State, Local not specified - PPO | Attending: Nurse Practitioner | Admitting: Hematology

## 2022-02-07 ENCOUNTER — Encounter: Payer: Self-pay | Admitting: Hematology

## 2022-02-07 VITALS — BP 133/77 | HR 82 | Temp 98.4°F | Resp 15 | Wt 108.5 lb

## 2022-02-07 VITALS — BP 129/68 | HR 81 | Resp 16

## 2022-02-07 DIAGNOSIS — Z9221 Personal history of antineoplastic chemotherapy: Secondary | ICD-10-CM | POA: Diagnosis not present

## 2022-02-07 DIAGNOSIS — C20 Malignant neoplasm of rectum: Secondary | ICD-10-CM

## 2022-02-07 DIAGNOSIS — Z95828 Presence of other vascular implants and grafts: Secondary | ICD-10-CM

## 2022-02-07 DIAGNOSIS — D649 Anemia, unspecified: Secondary | ICD-10-CM | POA: Diagnosis not present

## 2022-02-07 DIAGNOSIS — I10 Essential (primary) hypertension: Secondary | ICD-10-CM | POA: Insufficient documentation

## 2022-02-07 DIAGNOSIS — C787 Secondary malignant neoplasm of liver and intrahepatic bile duct: Secondary | ICD-10-CM | POA: Diagnosis not present

## 2022-02-07 DIAGNOSIS — Z5189 Encounter for other specified aftercare: Secondary | ICD-10-CM | POA: Diagnosis not present

## 2022-02-07 DIAGNOSIS — Z1509 Genetic susceptibility to other malignant neoplasm: Secondary | ICD-10-CM | POA: Insufficient documentation

## 2022-02-07 DIAGNOSIS — Z923 Personal history of irradiation: Secondary | ICD-10-CM | POA: Diagnosis not present

## 2022-02-07 DIAGNOSIS — Z5111 Encounter for antineoplastic chemotherapy: Secondary | ICD-10-CM | POA: Diagnosis not present

## 2022-02-07 DIAGNOSIS — C182 Malignant neoplasm of ascending colon: Secondary | ICD-10-CM | POA: Diagnosis present

## 2022-02-07 DIAGNOSIS — Z5112 Encounter for antineoplastic immunotherapy: Secondary | ICD-10-CM | POA: Diagnosis present

## 2022-02-07 DIAGNOSIS — Z79899 Other long term (current) drug therapy: Secondary | ICD-10-CM | POA: Insufficient documentation

## 2022-02-07 DIAGNOSIS — F419 Anxiety disorder, unspecified: Secondary | ICD-10-CM | POA: Diagnosis not present

## 2022-02-07 LAB — COMPREHENSIVE METABOLIC PANEL
ALT: 5 U/L (ref 0–44)
AST: 12 U/L — ABNORMAL LOW (ref 15–41)
Albumin: 3.7 g/dL (ref 3.5–5.0)
Alkaline Phosphatase: 81 U/L (ref 38–126)
Anion gap: 4 — ABNORMAL LOW (ref 5–15)
BUN: 11 mg/dL (ref 8–23)
CO2: 28 mmol/L (ref 22–32)
Calcium: 8.6 mg/dL — ABNORMAL LOW (ref 8.9–10.3)
Chloride: 106 mmol/L (ref 98–111)
Creatinine, Ser: 0.66 mg/dL (ref 0.44–1.00)
GFR, Estimated: >60 mL/min (ref >60)
Glucose, Bld: 91 mg/dL (ref 70–99)
Potassium: 3.8 mmol/L (ref 3.5–5.1)
Sodium: 138 mmol/L (ref 135–145)
Total Bilirubin: 0.3 mg/dL (ref 0.3–1.2)
Total Protein: 6.7 g/dL (ref 6.5–8.1)

## 2022-02-07 LAB — CBC WITH DIFFERENTIAL/PLATELET
Abs Immature Granulocytes: 0.04 10*3/uL (ref 0.00–0.07)
Basophils Absolute: 0 10*3/uL (ref 0.0–0.1)
Basophils Relative: 0 %
Eosinophils Absolute: 0.1 10*3/uL (ref 0.0–0.5)
Eosinophils Relative: 1 %
HCT: 29.8 % — ABNORMAL LOW (ref 36.0–46.0)
Hemoglobin: 9.4 g/dL — ABNORMAL LOW (ref 12.0–15.0)
Immature Granulocytes: 1 %
Lymphocytes Relative: 16 %
Lymphs Abs: 1.1 10*3/uL (ref 0.7–4.0)
MCH: 26.1 pg (ref 26.0–34.0)
MCHC: 31.5 g/dL (ref 30.0–36.0)
MCV: 82.8 fL (ref 80.0–100.0)
Monocytes Absolute: 0.6 10*3/uL (ref 0.1–1.0)
Monocytes Relative: 8 %
Neutro Abs: 5.5 10*3/uL (ref 1.7–7.7)
Neutrophils Relative %: 74 %
Platelets: 157 10*3/uL (ref 150–400)
RBC: 3.6 MIL/uL — ABNORMAL LOW (ref 3.87–5.11)
RDW: 16.2 % — ABNORMAL HIGH (ref 11.5–15.5)
WBC: 7.4 10*3/uL (ref 4.0–10.5)
nRBC: 0 % (ref 0.0–0.2)

## 2022-02-07 MED ORDER — ATROPINE SULFATE 1 MG/ML IV SOLN
INTRAVENOUS | Status: AC
  Filled 2022-02-07: qty 1

## 2022-02-07 MED ORDER — PALONOSETRON HCL INJECTION 0.25 MG/5ML
INTRAVENOUS | Status: AC
  Filled 2022-02-07: qty 5

## 2022-02-07 MED ORDER — SODIUM CHLORIDE 0.9 % IV SOLN: Freq: Once | INTRAVENOUS | Status: AC

## 2022-02-07 MED ORDER — SODIUM CHLORIDE 0.9% FLUSH
10.0000 mL | INTRAVENOUS | Status: DC | PRN
  Administered 2022-02-07: 10 mL

## 2022-02-07 MED ORDER — SODIUM CHLORIDE 0.9 % IV SOLN
400.0000 mg/m2 | Freq: Once | INTRAVENOUS | Status: AC
  Administered 2022-02-07: 616 mg via INTRAVENOUS
  Filled 2022-02-07: qty 30.8

## 2022-02-07 MED ORDER — SODIUM CHLORIDE 0.9% FLUSH
10.0000 mL | Freq: Once | INTRAVENOUS | Status: AC
  Administered 2022-02-07: 10 mL

## 2022-02-07 MED ORDER — SODIUM CHLORIDE 0.9 % IV SOLN
1800.0000 mg/m2 | INTRAVENOUS | Status: DC
  Administered 2022-02-07: 2750 mg via INTRAVENOUS
  Filled 2022-02-07: qty 55

## 2022-02-07 MED ORDER — SODIUM CHLORIDE 0.9 % IV SOLN
10.0000 mg | Freq: Once | INTRAVENOUS | Status: AC
  Administered 2022-02-07: 10 mg via INTRAVENOUS
  Filled 2022-02-07: qty 10

## 2022-02-07 MED ORDER — SODIUM CHLORIDE 0.9 % IV SOLN
5.0000 mg/kg | Freq: Once | INTRAVENOUS | Status: AC
  Administered 2022-02-07: 250 mg via INTRAVENOUS
  Filled 2022-02-07: qty 10

## 2022-02-07 MED ORDER — SODIUM CHLORIDE 0.9 % IV SOLN
150.0000 mg | Freq: Once | INTRAVENOUS | Status: AC
  Administered 2022-02-07: 150 mg via INTRAVENOUS
  Filled 2022-02-07: qty 150

## 2022-02-07 MED ORDER — PALONOSETRON HCL INJECTION 0.25 MG/5ML
0.2500 mg | Freq: Once | INTRAVENOUS | Status: AC
  Administered 2022-02-07: 0.25 mg via INTRAVENOUS

## 2022-02-07 MED ORDER — ATROPINE SULFATE 1 MG/ML IV SOLN
0.5000 mg | Freq: Once | INTRAVENOUS | Status: AC | PRN
  Administered 2022-02-07: 0.5 mg via INTRAVENOUS

## 2022-02-07 MED ORDER — SODIUM CHLORIDE 0.9 % IV SOLN
110.0000 mg/m2 | Freq: Once | INTRAVENOUS | Status: AC
  Administered 2022-02-07: 160 mg via INTRAVENOUS
  Filled 2022-02-07: qty 8

## 2022-02-07 NOTE — Patient Instructions (Addendum)
[facility]  Discharge Instructions: Thank you for choosing [facility] to provide your oncology and hematology care.   If you have a lab appointment with the [facility], please go directly to the [facility] and check in at the registration area.   Wear comfortable clothing and clothing appropriate for easy access to any Portacath or PICC line.   We strive to give you quality time with your provider. You may need to reschedule your appointment if you arrive late (15 or more minutes).  Arriving late affects you and other patients whose appointments are after yours.  Also, if you miss three or more appointments without notifying the office, you may be dismissed from the clinic at the provider's discretion.      For prescription refill requests, have your pharmacy contact our office and allow 72 hours for refills to be completed.    Today you received the following chemotherapy and/or immunotherapy agents: MVASI/Irinotecan/Leucovorin/Fluorouracil      To help prevent nausea and vomiting after your treatment, we encourage you to take your nausea medication as directed.  BELOW ARE SYMPTOMS THAT SHOULD BE REPORTED IMMEDIATELY: *FEVER GREATER THAN 100.4 F (38 C) OR HIGHER *CHILLS OR SWEATING *NAUSEA AND VOMITING THAT IS NOT CONTROLLED WITH YOUR NAUSEA MEDICATION *UNUSUAL SHORTNESS OF BREATH *UNUSUAL BRUISING OR BLEEDING *URINARY PROBLEMS (pain or burning when urinating, or frequent urination) *BOWEL PROBLEMS (unusual diarrhea, constipation, pain near the anus) TENDERNESS IN MOUTH AND THROAT WITH OR WITHOUT PRESENCE OF ULCERS (sore throat, sores in mouth, or a toothache) UNUSUAL RASH, SWELLING OR PAIN  UNUSUAL VAGINAL DISCHARGE OR ITCHING   Items with * indicate a potential emergency and should be followed up as soon as possible or go to the Emergency Department if any problems should occur.  Please show the CHEMOTHERAPY ALERT CARD or  IMMUNOTHERAPY ALERT CARD at check-in to the Emergency Department and triage nurse.  Should you have questions after your visit or need to cancel or reschedule your appointment, please contact [facility]  Dept: [phone]  and follow the prompts.  Office hours are 8:00 a.m. to 4:30 p.m. Monday - Friday. Please note that voicemails left after 4:00 p.m. may not be returned until the following business day.  We are closed weekends and major holidays. You have access to a nurse at all times for urgent questions. Please call the main number to the clinic Dept: [phone] and follow the prompts.   For any non-urgent questions, you may also contact your provider using MyChart. We now offer e-Visits for anyone 61 and older to request care online for non-urgent symptoms. For details visit [email or web address].   Also download the MyChart app! Go to the app store, search "MyChart", open the app, select [facility], and log in with your MyChart username and password.  Masks are optional in the cancer centers. If you would like for your care team to wear a mask while they are taking care of you, please let them know. You may have one support person who is at least 73 years old accompany you for your appointments. The chemotherapy medication bag should finish at 46 hours, 96 hours, or 7 days. For example, if your pump is scheduled for 46 hours and it was put on at 4:00 p.m., it should finish at 2:00 p.m. the day it is scheduled to come off regardless of your appointment time.     Estimated time to finish at 11:50 AM  on 02/10/22.   If the display on your pump reads "Low Volume" and it is beeping, take the batteries out of the pump and come to the cancer center for it to be taken off.   If the pump alarms go off prior to the pump reading "Low Volume" then call [phone] and someone can assist you.  If the plunger comes out and the chemotherapy medication is leaking out,  please use your home chemo spill kit to clean up the spill. Do NOT use paper towels or other household products.  If you have problems or questions regarding your pump, please call either [phone] (24 hours a day) or the cancer center Monday-Friday 8:00 a.m.- 4:30 p.m. at the clinic number and we will assist you. If you are unable to get assistance, then go to the nearest Emergency Department and ask the staff to contact the IV team for assistance.

## 2022-02-07 NOTE — Progress Notes (Signed)
[place]   [phone]) [phone] [phone]   Clinic Follow up Note   Patient Care Team: Patient, No Pcp Per as PCP - General (General Practice) [name], NP as Nurse Practitioner (Oncology) [name], MD as Consulting Physician (Oncology) [name], RN (Inactive) as Oncology Nurse Navigator  Date of Service:  02/07/2022  CHIEF COMPLAINT: f/u of metastatic rectal cancer, h/o colon cancer  CURRENT THERAPY:  FOLFIRI and bevacizumab, q2weeks, restarted 12/27/21  ASSESSMENT & PLAN:  Brittney Spencer is a 71 y.o. female with   1. Rectal adenocarcinoma, with liver and right pelvic metastasis, recurrence from previous colon cancer vs new primary   -Diagnosed on routine screening colonoscopy 05/2020, rectal mass biopsy showed invasive adenocarcinoma, arising from a tubular adenoma -Her 07/12/20 PET showed positive uptake of known rectal tumor and soft tissue mass within the right iliac fossa indicating recurrent prior colon cancer. There is also left lobe of liver lesion is FDG avid concerning for liver metastasis. pt declined liver biopsy -whether this is recurrent/metastatic colon or metastatic rectal cancer, this is not curable but still treatable. She previously declined referral for HIPEC -She began first-line chemo with dose-reduced FOLFIRI on 08/06/20, Bevacizumab added with cycle 2. -She changed to maintenance therapy with irinotecan and bevacizumab every 2 weeks on 01/24/21. -restaging CT CAP 12/10/21 showed: new 1.5 cm right hepatic lobe metastasis which is not visible on last CT 3 months ago; stable soft tissue thickening in right iliac fossa and pulmonary nodules.  -Her CEA has also been rising, up to 11.33 on 12/13/21. 5FU was added back to her treatment on 12/27/21. -She did not tolerate first cycle FOLFIRI well, still very tired with low appetite. She tolerated cycle 2 better with reduced dose. -she reports she is feeling better today. Labs  reviewed, hgb stable at 9.4, WBC improved with GCF-S. Will continue at same reduced dose.   2. Cancer of ascending colon, pT4bN1bM0, stage IIIC, MSI-stable, (+) surgical margins at pelvic wall     -She was diagnosed in 12/2015. She is s/p right hemicolectomy with right salpingo oophorectomy and adjuvant ChemoRT. -Her 04/2019 CT scan was NED  -With recent rectal cancer diagnosis, her PET from 07/12/20 indicates soft tissue mass within the right iliac fossa is noted and consistent with local tumor recurrence from previous ascending colon tumor.   3. Mild Anemia -Has been stable and mild for the past year.  -worse lately due to rectal cancer and starting chemo -denies bleeding -monitoring, stable in 9-range.   4. HTN, Anxiety -She'll follow-up with her primary care physician and continue medication -She uses half tab Xanax as needed, mostly only on treatment days. -stable     PLAN: -proceed with C3 FOLFIRI/beva today  -[name] injection on day 3 with pump d/c -lab, flush, f/u, and FOLFIRI/beva every 2 weeks   No problem-specific Assessment & Plan notes found for this encounter.   SUMMARY OF ONCOLOGIC HISTORY: Oncology History Overview Note  Cancer Staging Cancer of ascending colon [facility]) Staging form: Colon and Rectum, AJCC 7th Edition - Clinical stage from 02/07/2016: Stage IIIC (T4b, N1b, M0) - Signed by [name], MD on 03/04/2016 Laterality: Right Residual tumor (R): R2 - Macroscopic    Cancer of ascending colon ([place])  10/25/2015 Imaging   CT ABD/PELVIS:  Inflammatory changes inferior to the cecal tip appear improved, there is still irregular soft tissue thickening of the cecal tip, and there are adjacent prominent lymph nodes in the ileocolonic mesentery, measuring  13 mm on image 49 and 8 mm on image 52. In addition, there is a 2.5 x 1.8 cm nodule on image 46 which has central low density. Therefore, these findings are moderately suspicious for an underlying cecal malignancy  with perforation.    01/19/2016 Procedure   COLONOSCOPY per Dr. [name]: Fungating, ulcerated mass almost obstructing mid ascending colon   01/19/2016 Initial Biopsy   Diagnosis Surgical [P], cecal mass - INVASIVE ADENOCARCINOMA WITH ULCERATION. - SEE COMMENT.   02/05/2016 Tumor Marker   Patient's tumor was tested for the following markers: CEA Results of the tumor marker test revealed 5.7.   02/07/2016 Initial Diagnosis   Cancer of ascending colon ([place])   02/07/2016 Definitive Surgery   Laparoscopic assisted right hemicolectomy and right salpingo oopherectomy--Dr. [name]   02/07/2016 Pathologic Stage   p T4 N1b   2/43 nodes +   02/07/2016 Pathology Results   MMR normal; G2 adenocarcinoma;proximal & distal margins negative; soft tissue mass on pelvic sidewall + for adenocarcinoma with positive margin MSI Stable   03/08/2016 Imaging   CT chest negative for metastasis.    03/19/2016 - 04/25/2016 Radiation Therapy   Adjuvant irradiation, 50 gray in 28 fractions   03/19/2016 - 04/22/2016 Chemotherapy   Xeloda 1500 mg twice daily, started on 03/19/2016, dose reduced to 1000 mg twice daily from week 3 due to neutropenia, and patient stopped 3 days before last dose radiation due to difficulty swallowing the pill    05/20/2016 -  Adjuvant Chemotherapy   Patient declined adjuvant chemotherapy   09/16/2016 Imaging   CT CAP w Contrast 1. No evidence of local tumor recurrence at the ileocolic anastomosis. 2. No findings suspicious for metastatic disease in the chest, abdomen or pelvis. 3. Nonspecific trace free fluid in the pelvic cul-de-sac. 4. Stable solitary 3 mm right upper lobe pulmonary nodule, for which 6 month stability has been demonstrated, probably benign. 5. Additional findings include stable right posterior pericardial cyst and small calcified uterine fibroids.   05/13/2017 Imaging   CT CAP W Contrast 05/13/17 IMPRESSION: 1. No current findings of residual or recurrent  malignancy. 2. Mild prominence of stool throughout the colon. Nondistended portions of the rectum. 3. Several tiny pulmonary nodules are stable from the earliest available comparison of 03/08/2016 and probably benign, but may merit surveillance. 4. Other imaging findings of potential clinical significance: Old granulomatous disease. Aortoiliac atherosclerotic vascular disease. Lumbar spondylosis and degenerative disc disease. Stable amount of trace free pelvic fluid.   04/27/2018 Imaging   04/27/2018 CT CAP IMPRESSION: Stable exam. No evidence of recurrent or metastatic carcinoma within the chest, abdomen, or pelvis   04/19/2019 Imaging   CT CAP W Contrast  IMPRESSION: Chest Impression:   1. No evidence of thoracic metastasis. 2. Stable small bilateral pulmonary nodules.   Abdomen / Pelvis Impression:   1. No evidence local colorectal carcinoma recurrence or metastasis in the abdomen pelvis. 2. Post RIGHT hemicolectomy.   01/22/2021 Imaging   CT CAP  IMPRESSION: CT CHEST IMPRESSION   1. Similar nonspecific pulmonary nodules. 2. New posterior left upper lobe reticulonodular opacity, suspicious for interval mild infection or inflammation. 3. No thoracic adenopathy.   CT ABDOMEN AND PELVIS IMPRESSION   1. Further decrease in size of high left hepatic lobe 3 mm low-density lesion. No new or progressive metastatic disease within the abdomen or pelvis. 2. Similar trace free pelvic fluid. 3. Similar nonspecific mid rectal wall thickening. 4.  Aortic Atherosclerosis (ICD10-I70.0).   04/23/2021 Imaging  CT CAP  IMPRESSION: 1. Treated metastatic lesion between segments 2 and 3 of the liver, slightly smaller and less distinct than prior examination. No other signs of definite metastatic disease elsewhere in the abdomen or pelvis. 2. Multiple small pulmonary nodules, stable compared to the prior examination, favored to be benign. No definitive findings to  suggest metastatic disease to the thorax. 3. Aortic atherosclerosis. 4. Additional incidental findings, as above.   08/13/2021 Imaging   EXAM: CT CHEST, ABDOMEN, AND PELVIS WITH CONTRAST  IMPRESSION: 1. A previously seen PET avid lesion of the anterior left lobe of the liver, hepatic segment II, is no longer discretely appreciable consistent with treatment response of a hepatic metastasis. 2. No evidence of new metastatic disease in the chest, abdomen, or pelvis. 3. Interval increase in a small focus of consolidation and nodularity of the medial left upper lobe, consistent with minimal, ongoing atypical infection. Additional tiny bilateral pulmonary nodules are stable and almost certainly incidental benign. Attention on follow-up. 4. Status post right hemicolectomy and ileocolic anastomosis.   Rectal cancer metastasized to liver ([place])  06/15/2020 Procedure   Screening Colonoscopy by Dr [name]  IMPRESSION - Decreased sphincter tone and internal hemorrhoids that prolapse with straining, but require manual replacement into the anal canal (Grade III) found on digital rectal exam. - Patent side-to-side ileo-colonic anastomosis, characterized by healthy appearing mucosa. - The examined portion of the ileum was normal. - One diminutive polyp in the proximal transverse colon, removed with a cold biopsy forceps. Resected and retrieved. - Likely malignant partially obstructing tumor in the mid rectum. Biopsied. Tattooed. - The examination was otherwise normal on direct and retroflexion views.   06/15/2020 Initial Biopsy   Diagnosis 1. Transverse Colon Polyp - HYPERPLASTIC POLYP 2. Rectum, biopsy - ADENOCARCINOMA ARISING IN A TUBULAR ADENOMA WITH HIGH-GRADE DYSPLASIA. SEE NOTE Diagnosis Note 2. Dr. [name] reviewed the case and concurs with the diagnosis. Dr. [name] was notified on 06/16/2020.   06/28/2020 Imaging   CT CAP  IMPRESSION: 1. New low-density focus in the anterior aspect  of the lateral segment LEFT hepatic lobe measuring 1.2 x 1.0 cm, compatible with small metastatic lesion in the LEFT hepatic lobe. 2. Soft tissue in the RIGHT iliac fossa following RIGHT hemicolectomy invades the psoas musculature and is slowly enlarging over time, more linear on the prior study now highly concerning for recurrence/metastasis to this location. 3. Signs of enteritis, potentially post radiation changes of the small bowel. Tethered small bowel in the RIGHT lower quadrant shows focal thickening and narrowing suspicious for small bowel involvement and developing partial obstruction though currently contrast passes beyond this point into the colon. 4. Rectal thickening in this patient with known rectal mass as described. 5. No evidence of metastatic disease in the chest. 6. Stable small pulmonary nodules. 7.  and aortic atherosclerosis.   Aortic Atherosclerosis (ICD10-I70.0) and Emphysema (ICD10-J43.9).   07/04/2020 Initial Diagnosis   Rectal cancer metastasized to liver ([place])   07/12/2020 PET scan   IMPRESSION: 1. Exam positive for FDG avid rectal tumor which corresponds to the recent colonoscopy findings. 2. FDG avid soft tissue mass within the right iliac fossa is noted and consistent with local tumor recurrence from previous ascending colon tumor. 3. Lateral segment left lobe of liver lesion is FDG avid concerning for liver metastasis. 4. No specific findings identified to suggest metastatic disease to the chest.   08/10/2020 -  Chemotherapy   First-line FOLFIRI q2weeks starting 08/10/20. dose reduced with cycle 1. Irinotecan/5FU increased and  Bevacizumab added with cycle 2 on 08/23/2020    10/27/2020 Imaging   CT CAP  IMPRESSION: 1. Interval decrease in size of the hypermetabolic left hepatic lesion, consistent with metastatic disease. No new liver lesion evident. 2. Interval resolution of the hypermetabolic soft tissue lesion along the right iliac fossa with no  measurable soft tissue lesion remaining at this location today. 3. Similar appearance of soft tissue fullness in the rectum at the site of the hypermetabolic lesion seen previously. 4. Stable tiny bilateral pulmonary nodules. Continued attention on follow-up recommended. 5. Small volume free fluid in the pelvis. 6. Aortic Atherosclerosis (ICD10-I70.0).   01/22/2021 Imaging   CT CAP  IMPRESSION: CT CHEST IMPRESSION   1. Similar nonspecific pulmonary nodules. 2. New posterior left upper lobe reticulonodular opacity, suspicious for interval mild infection or inflammation. 3. No thoracic adenopathy.   CT ABDOMEN AND PELVIS IMPRESSION   1. Further decrease in size of high left hepatic lobe 3 mm low-density lesion. No new or progressive metastatic disease within the abdomen or pelvis. 2. Similar trace free pelvic fluid. 3. Similar nonspecific mid rectal wall thickening. 4.  Aortic Atherosclerosis (ICD10-I70.0).   04/23/2021 Imaging   CT CAP  IMPRESSION: 1. Treated metastatic lesion between segments 2 and 3 of the liver, slightly smaller and less distinct than prior examination. No other signs of definite metastatic disease elsewhere in the abdomen or pelvis. 2. Multiple small pulmonary nodules, stable compared to the prior examination, favored to be benign. No definitive findings to suggest metastatic disease to the thorax. 3. Aortic atherosclerosis. 4. Additional incidental findings, as above.   08/13/2021 Imaging   EXAM: CT CHEST, ABDOMEN, AND PELVIS WITH CONTRAST  IMPRESSION: 1. A previously seen PET avid lesion of the anterior left lobe of the liver, hepatic segment II, is no longer discretely appreciable consistent with treatment response of a hepatic metastasis. 2. No evidence of new metastatic disease in the chest, abdomen, or pelvis. 3. Interval increase in a small focus of consolidation and nodularity of the medial left upper lobe, consistent with  minimal, ongoing atypical infection. Additional tiny bilateral pulmonary nodules are stable and almost certainly incidental benign. Attention on follow-up. 4. Status post right hemicolectomy and ileocolic anastomosis.      INTERVAL HISTORY:  MARKA TRELOAR is here for a follow up of metastatic rectal cancer. She was last seen by NP Lacie on 01/24/22. She presents to the clinic alone. She reports she is feeling better today.   All other systems were reviewed with the patient and are negative.  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 Some residual intraretinal fluid remains, very small localized subfoveal of fluid remains although this continues to slowly resorb. We'll continue to observe.   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  ALLERGIES:  is allergic to fish allergy, peanut-containing drug products, soy allergy, and buspirone.  MEDICATIONS:  Current Outpatient Medications  Medication Sig Dispense Refill   ALPRAZolam (XANAX) 0.25 MG tablet Take 1 tablet (0.25 mg total) by mouth daily as needed for anxiety. 30 tablet 0   Cholecalciferol (VITAMIN D3 PO) Take by mouth daily.     diphenoxylate-atropine (LOMOTIL) 2.5-0.025 MG tablet Take 2 tablets by mouth 4 (four) times daily as needed for diarrhea or loose stools. 45 tablet 3   docusate sodium (COLACE) 100 MG capsule 1 capsule as needed     lidocaine-prilocaine (EMLA) cream Apply 1 application topically as needed. 30 g 1   NORVASC 2.5 MG tablet Take 1 tablet (2.5 mg total) by mouth daily. 90 tablet 1   ondansetron (ZOFRAN) 8 MG tablet Take 1 tablet (8 mg total) by mouth every 8 (eight) hours as needed for nausea or vomiting. 20 tablet 2   prochlorperazine (COMPAZINE) 10 MG tablet Take 1 tablet (10 mg total) by mouth every 6 (six) hours as needed for nausea or vomiting. 30 tablet 2   Current Facility-Administered Medications  Medication Dose Route Frequency Provider Last Rate Last Admin   0.9 %  sodium chloride infusion  500 mL Intravenous Once Danis, Estill Cotta III, MD        PHYSICAL EXAMINATION: ECOG PERFORMANCE STATUS: 1 - Symptomatic but completely ambulatory  Vitals:   02/07/22 0938  BP: 133/77  Pulse: 82  Resp: 15  Temp: 98.4 F (36.9 C)  SpO2: 100%   Wt Readings from Last 3 Encounters:  02/07/22 108 lb 8 oz (49.2 kg)  01/24/22 110 lb 3.2 oz (50 kg)  01/10/22 110 lb 1.6 oz (49.9 kg)     GENERAL:alert, no distress and comfortable SKIN: skin color normal, no rashes or significant lesions EYES: normal, Conjunctiva are pink and non-injected, sclera clear  NEURO: alert & oriented x 3 with fluent speech  LABORATORY DATA:  I have reviewed the data as listed    Latest Ref Rng & Units 02/07/2022    9:20 AM 01/24/2022    8:38 AM  01/10/2022    8:57 AM  CBC  WBC 4.0 - 10.5 K/uL 7.4  3.1  7.0   Hemoglobin 12.0 - 15.0 g/dL 9.4  9.4  9.4   Hematocrit 36.0 - 46.0 % 29.8  30.5  30.5   Platelets 150 - 400 K/uL 157  205  211         Latest Ref Rng & Units 02/07/2022    9:20 AM 01/24/2022    8:38 AM 01/10/2022    8:57 AM  CMP  Glucose 70 - 99 mg/dL 91  96  91   BUN 8 - 23 mg/dL _0 Creatinine 0.44 - 1.00 mg/dL 0.66  0.70  0.72   Sodium 135 - 145 mmol/L 138  138  138   Potassium 3.5 -  5.1 mmol/L 3.8  3.8  4.0   Chloride 98 - 111 mmol/L 106  105  106   CO2 22 - 32 mmol/L _0 Calcium 8.9 - 10.3 mg/dL 8.6  8.7  8.7   Total Protein 6.5 - 8.1 g/dL 6.7  6.8  6.5   Total Bilirubin 0.3 - 1.2 mg/dL 0.3  0.4  0.3   Alkaline Phos 38 - 126 U/L 81  69  90   AST 15 - 41 U/L _1 ALT 0 - 44 U/L _2 RADIOGRAPHIC STUDIES: I have personally reviewed the radiological images as listed and agreed with the findings in the report. No results found.    No orders of the defined types were placed in this encounter.  All questions were answered. The patient knows to call the clinic with any problems, questions or concerns. No barriers to learning was detected. The total time spent in the appointment was 30 minutes.     [name], MD 02/07/2022   I, Wilburn Mylar, am acting as scribe for [name], MD.   I have reviewed the above documentation for accuracy and completeness, and I agree with the above.

## 2022-02-09 ENCOUNTER — Inpatient Hospital Stay: Payer: Federal, State, Local not specified - PPO

## 2022-02-09 ENCOUNTER — Ambulatory Visit: Payer: Federal, State, Local not specified - PPO

## 2022-02-09 ENCOUNTER — Other Ambulatory Visit: Payer: Self-pay

## 2022-02-09 VITALS — BP 125/78 | HR 94 | Temp 97.7°F | Resp 15

## 2022-02-09 DIAGNOSIS — C20 Malignant neoplasm of rectum: Secondary | ICD-10-CM

## 2022-02-09 DIAGNOSIS — Z5112 Encounter for antineoplastic immunotherapy: Secondary | ICD-10-CM | POA: Diagnosis not present

## 2022-02-09 MED ORDER — HEPARIN SOD (PORK) LOCK FLUSH 100 UNIT/ML IV SOLN
500.0000 [IU] | Freq: Once | INTRAVENOUS | Status: AC | PRN
  Administered 2022-02-09: 500 [IU]

## 2022-02-09 MED ORDER — PEGFILGRASTIM-CBQV 6 MG/0.6ML ~~LOC~~ SOSY
6.0000 mg | PREFILLED_SYRINGE | Freq: Once | SUBCUTANEOUS | Status: AC
  Administered 2022-02-09: 6 mg via SUBCUTANEOUS

## 2022-02-09 MED ORDER — SODIUM CHLORIDE 0.9% FLUSH
10.0000 mL | INTRAVENOUS | Status: DC | PRN
  Administered 2022-02-09: 10 mL

## 2022-02-20 MED FILL — Dexamethasone Sodium Phosphate Inj 100 MG/10ML: INTRAMUSCULAR | Qty: 1 | Status: AC

## 2022-02-20 MED FILL — Fosaprepitant Dimeglumine For IV Infusion 150 MG (Base Eq): INTRAVENOUS | Qty: 5 | Status: AC

## 2022-02-21 ENCOUNTER — Inpatient Hospital Stay: Payer: Federal, State, Local not specified - PPO

## 2022-02-21 ENCOUNTER — Inpatient Hospital Stay: Payer: Federal, State, Local not specified - PPO | Admitting: Hematology

## 2022-02-21 ENCOUNTER — Other Ambulatory Visit: Payer: Self-pay

## 2022-02-21 ENCOUNTER — Encounter: Payer: Self-pay | Admitting: Hematology

## 2022-02-21 VITALS — BP 139/78 | HR 88 | Temp 98.1°F | Resp 18 | Ht 66.0 in | Wt 109.8 lb

## 2022-02-21 DIAGNOSIS — C20 Malignant neoplasm of rectum: Secondary | ICD-10-CM

## 2022-02-21 DIAGNOSIS — Z95828 Presence of other vascular implants and grafts: Secondary | ICD-10-CM

## 2022-02-21 DIAGNOSIS — Z5112 Encounter for antineoplastic immunotherapy: Secondary | ICD-10-CM | POA: Diagnosis not present

## 2022-02-21 DIAGNOSIS — C787 Secondary malignant neoplasm of liver and intrahepatic bile duct: Secondary | ICD-10-CM

## 2022-02-21 DIAGNOSIS — D500 Iron deficiency anemia secondary to blood loss (chronic): Secondary | ICD-10-CM

## 2022-02-21 DIAGNOSIS — I10 Essential (primary) hypertension: Secondary | ICD-10-CM | POA: Diagnosis not present

## 2022-02-21 LAB — CMP (CANCER CENTER ONLY)
ALT: 5 U/L (ref 0–44)
AST: 12 U/L — ABNORMAL LOW (ref 15–41)
Albumin: 3.7 g/dL (ref 3.5–5.0)
Alkaline Phosphatase: 96 U/L (ref 38–126)
Anion gap: 4 — ABNORMAL LOW (ref 5–15)
BUN: 8 mg/dL (ref 8–23)
CO2: 28 mmol/L (ref 22–32)
Calcium: 9.1 mg/dL (ref 8.9–10.3)
Chloride: 106 mmol/L (ref 98–111)
Creatinine: 0.53 mg/dL (ref 0.44–1.00)
GFR, Estimated: >60 mL/min (ref >60)
Glucose, Bld: 87 mg/dL (ref 70–99)
Potassium: 4 mmol/L (ref 3.5–5.1)
Sodium: 138 mmol/L (ref 135–145)
Total Bilirubin: 0.2 mg/dL — ABNORMAL LOW (ref 0.3–1.2)
Total Protein: 6.6 g/dL (ref 6.5–8.1)

## 2022-02-21 LAB — CBC WITH DIFFERENTIAL (CANCER CENTER ONLY)
Abs Immature Granulocytes: 0.07 10*3/uL (ref 0.00–0.07)
Basophils Absolute: 0 10*3/uL (ref 0.0–0.1)
Basophils Relative: 0 %
Eosinophils Absolute: 0.1 10*3/uL (ref 0.0–0.5)
Eosinophils Relative: 1 %
HCT: 30.2 % — ABNORMAL LOW (ref 36.0–46.0)
Hemoglobin: 9.4 g/dL — ABNORMAL LOW (ref 12.0–15.0)
Immature Granulocytes: 1 %
Lymphocytes Relative: 14 %
Lymphs Abs: 1.3 10*3/uL (ref 0.7–4.0)
MCH: 25.5 pg — ABNORMAL LOW (ref 26.0–34.0)
MCHC: 31.1 g/dL (ref 30.0–36.0)
MCV: 81.8 fL (ref 80.0–100.0)
Monocytes Absolute: 0.6 10*3/uL (ref 0.1–1.0)
Monocytes Relative: 7 %
Neutro Abs: 6.8 10*3/uL (ref 1.7–7.7)
Neutrophils Relative %: 77 %
Platelet Count: 240 10*3/uL (ref 150–400)
RBC: 3.69 MIL/uL — ABNORMAL LOW (ref 3.87–5.11)
RDW: 16.8 % — ABNORMAL HIGH (ref 11.5–15.5)
WBC Count: 8.9 10*3/uL (ref 4.0–10.5)
nRBC: 0 % (ref 0.0–0.2)

## 2022-02-21 LAB — CEA (IN HOUSE-CHCC): CEA (CHCC-In House): 21.84 ng/mL — ABNORMAL HIGH (ref 0.00–5.00)

## 2022-02-21 LAB — TOTAL PROTEIN, URINE DIPSTICK: Protein, ur: NEGATIVE mg/dL

## 2022-02-21 MED ORDER — SODIUM CHLORIDE 0.9 % IV SOLN
10.0000 mg | Freq: Once | INTRAVENOUS | Status: AC
  Administered 2022-02-21: 10 mg via INTRAVENOUS
  Filled 2022-02-21: qty 10

## 2022-02-21 MED ORDER — SODIUM CHLORIDE 0.9 % IV SOLN
110.0000 mg/m2 | Freq: Once | INTRAVENOUS | Status: AC
  Administered 2022-02-21: 160 mg via INTRAVENOUS
  Filled 2022-02-21: qty 8

## 2022-02-21 MED ORDER — SODIUM CHLORIDE 0.9 % IV SOLN
5.0000 mg/kg | Freq: Once | INTRAVENOUS | Status: AC
  Administered 2022-02-21: 250 mg via INTRAVENOUS
  Filled 2022-02-21: qty 10

## 2022-02-21 MED ORDER — PALONOSETRON HCL INJECTION 0.25 MG/5ML
0.2500 mg | Freq: Once | INTRAVENOUS | Status: AC
  Administered 2022-02-21: 0.25 mg via INTRAVENOUS
  Filled 2022-02-21: qty 5

## 2022-02-21 MED ORDER — ATROPINE SULFATE 1 MG/ML IV SOLN: 0.5000 mg | Freq: Once | INTRAVENOUS | Status: DC | PRN

## 2022-02-21 MED ORDER — SODIUM CHLORIDE 0.9 % IV SOLN: Freq: Once | INTRAVENOUS | Status: AC

## 2022-02-21 MED ORDER — SODIUM CHLORIDE 0.9 % IV SOLN
400.0000 mg/m2 | Freq: Once | INTRAVENOUS | Status: AC
  Administered 2022-02-21: 604 mg via INTRAVENOUS
  Filled 2022-02-21: qty 30.2

## 2022-02-21 MED ORDER — SODIUM CHLORIDE 0.9 % IV SOLN
150.0000 mg | Freq: Once | INTRAVENOUS | Status: AC
  Administered 2022-02-21: 150 mg via INTRAVENOUS
  Filled 2022-02-21: qty 150

## 2022-02-21 MED ORDER — SODIUM CHLORIDE 0.9 % IV SOLN
1800.0000 mg/m2 | INTRAVENOUS | Status: DC
  Administered 2022-02-21: 2700 mg via INTRAVENOUS
  Filled 2022-02-21: qty 54

## 2022-02-21 MED ORDER — SODIUM CHLORIDE 0.9% FLUSH
10.0000 mL | Freq: Once | INTRAVENOUS | Status: AC
  Administered 2022-02-21: 10 mL

## 2022-02-21 NOTE — Progress Notes (Signed)
[place]   Telephone:([phone]) [phone] Fax:(336) [phone]   Clinic Follow up Note   Patient Care Team: Patient, No Pcp Per as PCP - General (General Practice) [name], NP as Nurse Practitioner (Oncology) [name], MD as Consulting Physician (Oncology) [name], RN (Inactive) as Oncology Nurse Navigator  Date of Service:  02/21/2022  CHIEF COMPLAINT: f/u of metastatic rectal cancer, h/o colon cancer  CURRENT THERAPY:  FOLFIRI and bevacizumab, q2weeks, restarted 12/27/21  ASSESSMENT & PLAN:  Brittney Spencer is a 71 y.o. female with   1. Rectal adenocarcinoma, with liver and right pelvic metastasis, recurrence from previous colon cancer vs new primary   -Diagnosed on routine screening colonoscopy 05/2020, rectal mass biopsy showed invasive adenocarcinoma, arising from a tubular adenoma -Her 07/12/20 PET showed positive uptake of known rectal tumor and soft tissue mass within the right iliac fossa indicating recurrent prior colon cancer. There is also left lobe of liver lesion is FDG avid concerning for liver metastasis. pt declined liver biopsy -whether this is recurrent/metastatic colon or metastatic rectal cancer, this is not curable but still treatable. She previously declined referral for HIPEC -She began first-line chemo with dose-reduced FOLFIRI on 08/06/20, Bevacizumab added with cycle 2. -She changed to maintenance therapy with irinotecan and bevacizumab every 2 weeks on 01/24/21. -restaging CT CAP 12/10/21 showed: new 1.5 cm right hepatic lobe metastasis which is not visible on last CT 3 months ago; stable soft tissue thickening in right iliac fossa and pulmonary nodules.  -Her CEA has also been rising, up to 11.33 on 12/13/21. 5FU was added back to her treatment on 12/27/21. -She did not tolerate first cycle FOLFIRI well, still very tired with low appetite. She tolerates much better now with reduced dose. -she reports she did much better with last  cycle. Labs reviewed, hgb very stable at 9.4, urine protein negative. Will continue at same reduced dose. -plan for restaging CT in 3-4 weeks   2. Cancer of ascending colon, pT4bN1bM0, stage IIIC, MSI-stable, (+) surgical margins at pelvic wall     -diagnosed in 12/2015. S/p right hemicolectomy with right salpingo oophorectomy and adjuvant ChemoRT. -Her 04/2019 CT scan was NED  -With recent rectal cancer diagnosis, her PET from 07/12/20 indicates soft tissue mass within the right iliac fossa is noted and consistent with local tumor recurrence from previous ascending colon tumor.   3. Mild Anemia -Has been stable and mild for the past year.  -worse lately due to rectal cancer and starting chemo -denies bleeding -monitoring, stable in 9-range.   4. HTN, Anxiety -She'll follow-up with her primary care physician and continue medication -She uses half tab Xanax as needed, mostly only on treatment days. -stable     PLAN: -proceed with C4 FOLFIRI/beva today at same dose reduction              -[name] injection on day 3 with pump d/c -lab, flush, f/u, and FOLFIRI/beva every 2 weeks -restaging CT in 3-4 weeks   No problem-specific Assessment & Plan notes found for this encounter.   SUMMARY OF ONCOLOGIC HISTORY: Oncology History Overview Note  Cancer Staging Cancer of ascending colon [facility]) Staging form: Colon and Rectum, AJCC 7th Edition - Clinical stage from 02/07/2016: Stage IIIC (T4b, N1b, M0) - Signed by [name], MD on 03/04/2016 Laterality: Right Residual tumor (R): R2 - Macroscopic    Cancer of ascending colon ([place])  10/25/2015 Imaging   CT ABD/PELVIS:  Inflammatory changes inferior to the cecal  tip appear improved, there is still irregular soft tissue thickening of the cecal tip, and there are adjacent prominent lymph nodes in the ileocolonic mesentery, measuring 13 mm on image 49 and 8 mm on image 52. In addition, there is a 2.5 x 1.8 cm nodule on image 46 which has central  low density. Therefore, these findings are moderately suspicious for an underlying cecal malignancy with perforation.    01/19/2016 Procedure   COLONOSCOPY per Dr. [name]: Fungating, ulcerated mass almost obstructing mid ascending colon   01/19/2016 Initial Biopsy   Diagnosis Surgical [P], cecal mass - INVASIVE ADENOCARCINOMA WITH ULCERATION. - Brittney COMMENT.   02/05/2016 Tumor Marker   Patient's tumor was tested for the following markers: CEA Results of the tumor marker test revealed 5.7.   02/07/2016 Initial Diagnosis   Cancer of ascending colon ([place])   02/07/2016 Definitive Surgery   Laparoscopic assisted right hemicolectomy and right salpingo oopherectomy--Dr. [name]   02/07/2016 Pathologic Stage   p T4 N1b   2/43 nodes +   02/07/2016 Pathology Results   MMR normal; G2 adenocarcinoma;proximal & distal margins negative; soft tissue mass on pelvic sidewall + for adenocarcinoma with positive margin MSI Stable   03/08/2016 Imaging   CT chest negative for metastasis.    03/19/2016 - 04/25/2016 Radiation Therapy   Adjuvant irradiation, 50 gray in 28 fractions   03/19/2016 - 04/22/2016 Chemotherapy   Xeloda 1500 mg twice daily, started on 03/19/2016, dose reduced to 1000 mg twice daily from week 3 due to neutropenia, and patient stopped 3 days before last dose radiation due to difficulty swallowing the pill    05/20/2016 -  Adjuvant Chemotherapy   Patient declined adjuvant chemotherapy   09/16/2016 Imaging   CT CAP w Contrast 1. No evidence of local tumor recurrence at the ileocolic anastomosis. 2. No findings suspicious for metastatic disease in the chest, abdomen or pelvis. 3. Nonspecific trace free fluid in the pelvic cul-de-sac. 4. Stable solitary 3 mm right upper lobe pulmonary nodule, for which 6 month stability has been demonstrated, probably benign. 5. Additional findings include stable right posterior pericardial cyst and small calcified uterine fibroids.   05/13/2017  Imaging   CT CAP W Contrast 05/13/17 IMPRESSION: 1. No current findings of residual or recurrent malignancy. 2. Mild prominence of stool throughout the colon. Nondistended portions of the rectum. 3. Several tiny pulmonary nodules are stable from the earliest available comparison of 03/08/2016 and probably benign, but may merit surveillance. 4. Other imaging findings of potential clinical significance: Old granulomatous disease. Aortoiliac atherosclerotic vascular disease. Lumbar spondylosis and degenerative disc disease. Stable amount of trace free pelvic fluid.   04/27/2018 Imaging   04/27/2018 CT CAP IMPRESSION: Stable exam. No evidence of recurrent or metastatic carcinoma within the chest, abdomen, or pelvis   04/19/2019 Imaging   CT CAP W Contrast  IMPRESSION: Chest Impression:   1. No evidence of thoracic metastasis. 2. Stable small bilateral pulmonary nodules.   Abdomen / Pelvis Impression:   1. No evidence local colorectal carcinoma recurrence or metastasis in the abdomen pelvis. 2. Post RIGHT hemicolectomy.   01/22/2021 Imaging   CT CAP  IMPRESSION: CT CHEST IMPRESSION   1. Similar nonspecific pulmonary nodules. 2. New posterior left upper lobe reticulonodular opacity, suspicious for interval mild infection or inflammation. 3. No thoracic adenopathy.   CT ABDOMEN AND PELVIS IMPRESSION   1. Further decrease in size of high left hepatic lobe 3 mm low-density lesion. No new or progressive metastatic disease within the abdomen  or pelvis. 2. Similar trace free pelvic fluid. 3. Similar nonspecific mid rectal wall thickening. 4.  Aortic Atherosclerosis (ICD10-I70.0).   04/23/2021 Imaging   CT CAP  IMPRESSION: 1. Treated metastatic lesion between segments 2 and 3 of the liver, slightly smaller and less distinct than prior examination. No other signs of definite metastatic disease elsewhere in the abdomen or pelvis. 2. Multiple small pulmonary nodules,  stable compared to the prior examination, favored to be benign. No definitive findings to suggest metastatic disease to the thorax. 3. Aortic atherosclerosis. 4. Additional incidental findings, as above.   08/13/2021 Imaging   EXAM: CT CHEST, ABDOMEN, AND PELVIS WITH CONTRAST  IMPRESSION: 1. A previously seen PET avid lesion of the anterior left lobe of the liver, hepatic segment II, is no longer discretely appreciable consistent with treatment response of a hepatic metastasis. 2. No evidence of new metastatic disease in the chest, abdomen, or pelvis. 3. Interval increase in a small focus of consolidation and nodularity of the medial left upper lobe, consistent with minimal, ongoing atypical infection. Additional tiny bilateral pulmonary nodules are stable and almost certainly incidental benign. Attention on follow-up. 4. Status post right hemicolectomy and ileocolic anastomosis.   Rectal cancer metastasized to liver ([place])  06/15/2020 Procedure   Screening Colonoscopy by Dr [name]  IMPRESSION - Decreased sphincter tone and internal hemorrhoids that prolapse with straining, but require manual replacement into the anal canal (Grade III) found on digital rectal exam. - Patent side-to-side ileo-colonic anastomosis, characterized by healthy appearing mucosa. - The examined portion of the ileum was normal. - One diminutive polyp in the proximal transverse colon, removed with a cold biopsy forceps. Resected and retrieved. - Likely malignant partially obstructing tumor in the mid rectum. Biopsied. Tattooed. - The examination was otherwise normal on direct and retroflexion views.   06/15/2020 Initial Biopsy   Diagnosis 1. Transverse Colon Polyp - HYPERPLASTIC POLYP 2. Rectum, biopsy - ADENOCARCINOMA ARISING IN A TUBULAR ADENOMA WITH HIGH-GRADE DYSPLASIA. Brittney NOTE Diagnosis Note 2. Dr. [name] reviewed the case and concurs with the diagnosis. Dr. [name] was notified on 06/16/2020.    06/28/2020 Imaging   CT CAP  IMPRESSION: 1. New low-density focus in the anterior aspect of the lateral segment LEFT hepatic lobe measuring 1.2 x 1.0 cm, compatible with small metastatic lesion in the LEFT hepatic lobe. 2. Soft tissue in the RIGHT iliac fossa following RIGHT hemicolectomy invades the psoas musculature and is slowly enlarging over time, more linear on the prior study now highly concerning for recurrence/metastasis to this location. 3. Signs of enteritis, potentially post radiation changes of the small bowel. Tethered small bowel in the RIGHT lower quadrant shows focal thickening and narrowing suspicious for small bowel involvement and developing partial obstruction though currently contrast passes beyond this point into the colon. 4. Rectal thickening in this patient with known rectal mass as described. 5. No evidence of metastatic disease in the chest. 6. Stable small pulmonary nodules. 7.  and aortic atherosclerosis.   Aortic Atherosclerosis (ICD10-I70.0) and Emphysema (ICD10-J43.9).   07/04/2020 Initial Diagnosis   Rectal cancer metastasized to liver ([place])   07/12/2020 PET scan   IMPRESSION: 1. Exam positive for FDG avid rectal tumor which corresponds to the recent colonoscopy findings. 2. FDG avid soft tissue mass within the right iliac fossa is noted and consistent with local tumor recurrence from previous ascending colon tumor. 3. Lateral segment left lobe of liver lesion is FDG avid concerning for liver metastasis. 4. No specific findings identified to suggest  metastatic disease to the chest.   08/10/2020 -  Chemotherapy   First-line FOLFIRI q2weeks starting 08/10/20. dose reduced with cycle 1. Irinotecan/5FU increased and Bevacizumab added with cycle 2 on 08/23/2020    08/17/2020 -  Chemotherapy   Patient is on Treatment Plan : COLORECTAL FOLFIRI + Bevacizumab q14d     10/27/2020 Imaging   CT CAP  IMPRESSION: 1. Interval decrease in size of the  hypermetabolic left hepatic lesion, consistent with metastatic disease. No new liver lesion evident. 2. Interval resolution of the hypermetabolic soft tissue lesion along the right iliac fossa with no measurable soft tissue lesion remaining at this location today. 3. Similar appearance of soft tissue fullness in the rectum at the site of the hypermetabolic lesion seen previously. 4. Stable tiny bilateral pulmonary nodules. Continued attention on follow-up recommended. 5. Small volume free fluid in the pelvis. 6. Aortic Atherosclerosis (ICD10-I70.0).   01/22/2021 Imaging   CT CAP  IMPRESSION: CT CHEST IMPRESSION   1. Similar nonspecific pulmonary nodules. 2. New posterior left upper lobe reticulonodular opacity, suspicious for interval mild infection or inflammation. 3. No thoracic adenopathy.   CT ABDOMEN AND PELVIS IMPRESSION   1. Further decrease in size of high left hepatic lobe 3 mm low-density lesion. No new or progressive metastatic disease within the abdomen or pelvis. 2. Similar trace free pelvic fluid. 3. Similar nonspecific mid rectal wall thickening. 4.  Aortic Atherosclerosis (ICD10-I70.0).   04/23/2021 Imaging   CT CAP  IMPRESSION: 1. Treated metastatic lesion between segments 2 and 3 of the liver, slightly smaller and less distinct than prior examination. No other signs of definite metastatic disease elsewhere in the abdomen or pelvis. 2. Multiple small pulmonary nodules, stable compared to the prior examination, favored to be benign. No definitive findings to suggest metastatic disease to the thorax. 3. Aortic atherosclerosis. 4. Additional incidental findings, as above.   08/13/2021 Imaging   EXAM: CT CHEST, ABDOMEN, AND PELVIS WITH CONTRAST  IMPRESSION: 1. A previously seen PET avid lesion of the anterior left lobe of the liver, hepatic segment II, is no longer discretely appreciable consistent with treatment response of a hepatic metastasis. 2. No  evidence of new metastatic disease in the chest, abdomen, or pelvis. 3. Interval increase in a small focus of consolidation and nodularity of the medial left upper lobe, consistent with minimal, ongoing atypical infection. Additional tiny bilateral pulmonary nodules are stable and almost certainly incidental benign. Attention on follow-up. 4. Status post right hemicolectomy and ileocolic anastomosis.      INTERVAL HISTORY:  Brittney Spencer is here for a follow up of metastatic rectal cancer. She was last seen by me on 02/07/22. She presents to the clinic alone. She reports she tolerated last cycle much better-- she was able to eat better, and her diarrhea was more related to diet and thus easier to control.   All other systems were reviewed with the patient and are negative.  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 Some residual intraretinal fluid remains, very small localized subfoveal of fluid remains although this continues to slowly resorb. We'll continue to observe.   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  ALLERGIES:  is allergic to fish allergy, peanut-containing drug products, soy allergy, and buspirone.  MEDICATIONS:  Current Outpatient Medications  Medication Sig Dispense Refill   ALPRAZolam (XANAX) 0.25 MG tablet Take 1 tablet (0.25 mg total) by mouth daily as needed for anxiety. 30 tablet 0   Cholecalciferol (VITAMIN D3 PO) Take by mouth daily.     diphenoxylate-atropine (LOMOTIL) 2.5-0.025 MG tablet Take 2 tablets by mouth 4 (four) times daily as needed for diarrhea or loose stools. 45 tablet 3   docusate sodium (COLACE) 100 MG capsule 1 capsule as needed     lidocaine-prilocaine (EMLA) cream Apply 1 application topically as needed. 30 g 1   NORVASC 2.5 MG tablet Take 1 tablet (2.5 mg total) by mouth daily. 90 tablet 1   ondansetron (ZOFRAN) 8 MG tablet Take 1 tablet (8 mg total) by mouth every 8 (eight) hours as needed for nausea or vomiting. 20 tablet 2   prochlorperazine (COMPAZINE) 10 MG tablet Take 1 tablet (10 mg total) by mouth every 6 (six) hours as needed for nausea or vomiting. 30 tablet 2   Current Facility-Administered Medications  Medication Dose Route Frequency Provider Last Rate Last Admin   0.9 %  sodium chloride infusion  500 mL Intravenous Once Doran Stabler, MD       Facility-Administered Medications Ordered in Other Visits  Medication Dose Route Frequency Provider Last Rate Last Admin   bevacizumab-awwb (MVASI) 250 mg in sodium chloride 0.9 % 100 mL chemo infusion  5 mg/kg (Treatment Plan Recorded) Intravenous Once [name], MD       dexamethasone (DECADRON) 10 mg in sodium chloride 0.9 % 50 mL IVPB  10 mg Intravenous Once [name], MD        fluorouracil (ADRUCIL) 2,700 mg in sodium chloride 0.9 % 96 mL chemo infusion  1,800 mg/m2 (Treatment Plan Recorded) Intravenous 1 day or 1 dose [name], MD       fosaprepitant (EMEND) 150 mg in sodium chloride 0.9 % 145 mL IVPB  150 mg Intravenous Once [name], MD       irinotecan (CAMPTOSAR) 160 mg in sodium chloride 0.9 % 500 mL chemo infusion  110 mg/m2 (Treatment Plan Recorded) Intravenous Once [name], MD       leucovorin 604 mg in sodium chloride 0.9 % 250 mL infusion  400 mg/m2 (Treatment Plan Recorded) Intravenous Once [name], MD       palonosetron (ALOXI) injection 0.25 mg  0.25 mg Intravenous Once [name], MD  PHYSICAL EXAMINATION: ECOG PERFORMANCE STATUS: 1 - Symptomatic but completely ambulatory  Vitals:   02/21/22 0907  BP: 139/78  Pulse: 88  Resp: 18  Temp: 98.1 F (36.7 C)  SpO2: 100%   Wt Readings from Last 3 Encounters:  02/21/22 109 lb 12.8 oz (49.8 kg)  02/07/22 108 lb 8 oz (49.2 kg)  01/24/22 110 lb 3.2 oz (50 kg)     GENERAL:alert, no distress and comfortable SKIN: skin color normal, no rashes or significant lesions EYES: normal, Conjunctiva are pink and non-injected, sclera clear  NEURO: alert & oriented x 3 with fluent speech  LABORATORY DATA:  I have reviewed the data as listed    Latest Ref Rng & Units 02/21/2022    8:43 AM 02/07/2022    9:20 AM 01/24/2022    8:38 AM  CBC  WBC 4.0 - 10.5 K/uL 8.9  7.4  3.1   Hemoglobin 12.0 - 15.0 g/dL 9.4  9.4  9.4   Hematocrit 36.0 - 46.0 % 30.2  29.8  30.5   Platelets 150 - 400 K/uL 240  157  205         Latest Ref Rng & Units 02/21/2022    8:43 AM 02/07/2022    9:20 AM 01/24/2022    8:38 AM  CMP  Glucose 70 - 99 mg/dL 87  91  96   BUN 8 - 23 mg/dL '8  11  14   ' Creatinine 0.44 - 1.00 mg/dL 0.53  0.66  0.70   Sodium 135 - 145 mmol/L 138  138  138   Potassium 3.5 - 5.1 mmol/L 4.0  3.8  3.8   Chloride 98 - 111 mmol/L 106  106  105   CO2 22 - 32 mmol/L '28  28  29   ' Calcium 8.9 - 10.3 mg/dL  9.1  8.6  8.7   Total Protein 6.5 - 8.1 g/dL 6.6  6.7  6.8   Total Bilirubin 0.3 - 1.2 mg/dL 0.2  0.3  0.4   Alkaline Phos 38 - 126 U/L 96  81  69   AST 15 - 41 U/L '12  12  14   ' ALT 0 - 44 U/L '5  5  5       ' RADIOGRAPHIC STUDIES: I have personally reviewed the radiological images as listed and agreed with the findings in the report. No results found.    Orders Placed This Encounter  Procedures   CT CHEST ABDOMEN PELVIS W CONTRAST    Standing Status:   Future    Standing Expiration Date:   02/22/2023    Order Specific Question:   Preferred imaging location?    Answer:   [facility]    Order Specific Question:   Release to patient    Answer:   Immediate    Order Specific Question:   Is Oral Contrast requested for this exam?    Answer:   Yes, Per Radiology protocol   CBC with Differential ([place] Only)    Standing Status:   Future    Number of Occurrences:   1    Standing Expiration Date:   02/22/2023   CMP ([place] only)    Standing Status:   Future    Number of Occurrences:   1    Standing Expiration Date:   02/22/2023   CBC with Differential ([place] Only)    Standing Status:   Future    Standing Expiration Date:   03/08/2023  CMP ([place] only)    Standing Status:   Future    Standing Expiration Date:   03/08/2023   Total Protein, Urine dipstick    Standing Status:   Future    Standing Expiration Date:   03/08/2023   CBC with Differential ([place] Only)    Standing Status:   Future    Standing Expiration Date:   03/22/2023   CMP ([place] only)    Standing Status:   Future    Standing Expiration Date:   03/22/2023   CBC with Differential ([place] Only)    Standing Status:   Future    Standing Expiration Date:   04/05/2023   CMP ([place] only)    Standing Status:   Future    Standing Expiration Date:   04/05/2023   All questions were answered. The patient knows to call the clinic with any problems, questions or  concerns. No barriers to learning was detected. The total time spent in the appointment was 30 minutes.     [name], MD 02/21/2022   I, Wilburn Mylar, am acting as scribe for [name], MD.   I have reviewed the above documentation for accuracy and completeness, and I agree with the above.

## 2022-02-21 NOTE — Patient Instructions (Signed)
[facility]  Discharge Instructions: Thank you for choosing [facility] to provide your oncology and hematology care.   If you have a lab appointment with the [facility], please go directly to the [facility] and check in at the registration area.   Wear comfortable clothing and clothing appropriate for easy access to any Portacath or PICC line.   We strive to give you quality time with your provider. You may need to reschedule your appointment if you arrive late (15 or more minutes).  Arriving late affects you and other patients whose appointments are after yours.  Also, if you miss three or more appointments without notifying the office, you may be dismissed from the clinic at the provider's discretion.      For prescription refill requests, have your pharmacy contact our office and allow 72 hours for refills to be completed.    Today you received the following chemotherapy and/or immunotherapy agents: MVASI/Irinotecan/Leucovorin/Fluorouracil      To help prevent nausea and vomiting after your treatment, we encourage you to take your nausea medication as directed.  BELOW ARE SYMPTOMS THAT SHOULD BE REPORTED IMMEDIATELY: *FEVER GREATER THAN 100.4 F (38 C) OR HIGHER *CHILLS OR SWEATING *NAUSEA AND VOMITING THAT IS NOT CONTROLLED WITH YOUR NAUSEA MEDICATION *UNUSUAL SHORTNESS OF BREATH *UNUSUAL BRUISING OR BLEEDING *URINARY PROBLEMS (pain or burning when urinating, or frequent urination) *BOWEL PROBLEMS (unusual diarrhea, constipation, pain near the anus) TENDERNESS IN MOUTH AND THROAT WITH OR WITHOUT PRESENCE OF ULCERS (sore throat, sores in mouth, or a toothache) UNUSUAL RASH, SWELLING OR PAIN  UNUSUAL VAGINAL DISCHARGE OR ITCHING   Items with * indicate a potential emergency and should be followed up as soon as possible or go to the Emergency Department if any problems should occur.  Please show the CHEMOTHERAPY ALERT CARD or  IMMUNOTHERAPY ALERT CARD at check-in to the Emergency Department and triage nurse.  Should you have questions after your visit or need to cancel or reschedule your appointment, please contact [facility]  Dept: [phone]  and follow the prompts.  Office hours are 8:00 a.m. to 4:30 p.m. Monday - Friday. Please note that voicemails left after 4:00 p.m. may not be returned until the following business day.  We are closed weekends and major holidays. You have access to a nurse at all times for urgent questions. Please call the main number to the clinic Dept: [phone] and follow the prompts.   For any non-urgent questions, you may also contact your provider using MyChart. We now offer e-Visits for anyone 73 and older to request care online for non-urgent symptoms. For details visit [email or web address].   Also download the MyChart app! Go to the app store, search "MyChart", open the app, select [facility], and log in with your MyChart username and password.  Masks are optional in the cancer centers. If you would like for your care team to wear a mask while they are taking care of you, please let them know. You may have one support Ramanda Paules who is at least 73 years old accompany you for your appointments. The chemotherapy medication bag should finish at 46 hours, 96 hours, or 7 days. For example, if your pump is scheduled for 46 hours and it was put on at 4:00 p.m., it should finish at 2:00 p.m. the day it is scheduled to come off regardless of your appointment time.     Estimated time to finish at 11:50 AM  on 73/13/23.   If the display on your pump reads "Low Volume" and it is beeping, take the batteries out of the pump and come to the cancer center for it to be taken off.   If the pump alarms go off prior to the pump reading "Low Volume" then call [phone] and someone can assist you.  If the plunger comes out and the chemotherapy medication is leaking out,  please use your home chemo spill kit to clean up the spill. Do NOT use paper towels or other household products.  If you have problems or questions regarding your pump, please call either [phone] (24 hours a day) or the cancer center Monday-Friday 8:00 a.m.- 4:30 p.m. at the clinic number and we will assist you. If you are unable to get assistance, then go to the nearest Emergency Department and ask the staff to contact the IV team for assistance.

## 2022-02-23 ENCOUNTER — Ambulatory Visit: Payer: Federal, State, Local not specified - PPO

## 2022-02-23 ENCOUNTER — Inpatient Hospital Stay: Payer: Federal, State, Local not specified - PPO

## 2022-02-23 VITALS — BP 125/78 | HR 75 | Temp 98.4°F | Resp 18

## 2022-02-23 DIAGNOSIS — C20 Malignant neoplasm of rectum: Secondary | ICD-10-CM

## 2022-02-23 DIAGNOSIS — Z5112 Encounter for antineoplastic immunotherapy: Secondary | ICD-10-CM | POA: Diagnosis not present

## 2022-02-23 MED ORDER — SODIUM CHLORIDE 0.9% FLUSH
10.0000 mL | INTRAVENOUS | Status: DC | PRN
  Administered 2022-02-23: 10 mL

## 2022-02-23 MED ORDER — HEPARIN SOD (PORK) LOCK FLUSH 100 UNIT/ML IV SOLN
500.0000 [IU] | Freq: Once | INTRAVENOUS | Status: AC | PRN
  Administered 2022-02-23: 500 [IU]

## 2022-02-23 MED ORDER — PEGFILGRASTIM-CBQV 6 MG/0.6ML ~~LOC~~ SOSY
6.0000 mg | PREFILLED_SYRINGE | Freq: Once | SUBCUTANEOUS | Status: AC
  Administered 2022-02-23: 6 mg via SUBCUTANEOUS

## 2022-02-23 NOTE — Patient Instructions (Signed)

## 2022-02-26 ENCOUNTER — Telehealth: Payer: Self-pay

## 2022-02-26 NOTE — Telephone Encounter (Signed)
Spoke with pt via telephone regarding recent CEA lab results.  Informed pt that CEA is slightly higher than last time but overall stable for FOLFIRI treatment per Cira Rue, NP.  Informed pt that Lacie and Dr. Burr Medico will continue with the same plan that was discussed with pt in her office visit with Dr. Burr Medico last visit.  Dr. Burr Medico and Cira Rue will continue to monitor the pt's labs and will make changes accordingly. Pt had no further questions or concerns at this time.

## 2022-03-06 MED FILL — Fosaprepitant Dimeglumine For IV Infusion 150 MG (Base Eq): INTRAVENOUS | Qty: 5 | Status: AC

## 2022-03-06 MED FILL — Dexamethasone Sodium Phosphate Inj 100 MG/10ML: INTRAMUSCULAR | Qty: 1 | Status: AC

## 2022-03-07 ENCOUNTER — Inpatient Hospital Stay: Payer: Federal, State, Local not specified - PPO | Attending: Nurse Practitioner

## 2022-03-07 ENCOUNTER — Other Ambulatory Visit: Payer: Self-pay

## 2022-03-07 ENCOUNTER — Encounter: Payer: Self-pay | Admitting: Hematology

## 2022-03-07 ENCOUNTER — Inpatient Hospital Stay: Payer: Federal, State, Local not specified - PPO | Admitting: Hematology

## 2022-03-07 ENCOUNTER — Inpatient Hospital Stay: Payer: Federal, State, Local not specified - PPO

## 2022-03-07 VITALS — BP 130/76 | HR 96 | Temp 98.7°F | Resp 18 | Ht 66.0 in | Wt 108.0 lb

## 2022-03-07 VITALS — BP 108/75 | HR 80 | Temp 98.4°F | Resp 16

## 2022-03-07 DIAGNOSIS — Z9221 Personal history of antineoplastic chemotherapy: Secondary | ICD-10-CM | POA: Diagnosis not present

## 2022-03-07 DIAGNOSIS — C787 Secondary malignant neoplasm of liver and intrahepatic bile duct: Secondary | ICD-10-CM | POA: Diagnosis not present

## 2022-03-07 DIAGNOSIS — I10 Essential (primary) hypertension: Secondary | ICD-10-CM | POA: Insufficient documentation

## 2022-03-07 DIAGNOSIS — F419 Anxiety disorder, unspecified: Secondary | ICD-10-CM | POA: Diagnosis not present

## 2022-03-07 DIAGNOSIS — Z923 Personal history of irradiation: Secondary | ICD-10-CM | POA: Diagnosis not present

## 2022-03-07 DIAGNOSIS — C20 Malignant neoplasm of rectum: Secondary | ICD-10-CM

## 2022-03-07 DIAGNOSIS — Z1509 Genetic susceptibility to other malignant neoplasm: Secondary | ICD-10-CM | POA: Diagnosis not present

## 2022-03-07 DIAGNOSIS — C182 Malignant neoplasm of ascending colon: Secondary | ICD-10-CM | POA: Diagnosis present

## 2022-03-07 DIAGNOSIS — Z5111 Encounter for antineoplastic chemotherapy: Secondary | ICD-10-CM | POA: Insufficient documentation

## 2022-03-07 DIAGNOSIS — Z5112 Encounter for antineoplastic immunotherapy: Secondary | ICD-10-CM | POA: Diagnosis present

## 2022-03-07 DIAGNOSIS — Z79899 Other long term (current) drug therapy: Secondary | ICD-10-CM | POA: Diagnosis not present

## 2022-03-07 DIAGNOSIS — Z5189 Encounter for other specified aftercare: Secondary | ICD-10-CM | POA: Diagnosis not present

## 2022-03-07 DIAGNOSIS — D649 Anemia, unspecified: Secondary | ICD-10-CM | POA: Diagnosis not present

## 2022-03-07 DIAGNOSIS — Z95828 Presence of other vascular implants and grafts: Secondary | ICD-10-CM

## 2022-03-07 LAB — CBC WITH DIFFERENTIAL (CANCER CENTER ONLY)
Abs Immature Granulocytes: 0.04 10*3/uL (ref 0.00–0.07)
Basophils Absolute: 0 10*3/uL (ref 0.0–0.1)
Basophils Relative: 0 %
Eosinophils Absolute: 0.1 10*3/uL (ref 0.0–0.5)
Eosinophils Relative: 1 %
HCT: 30.3 % — ABNORMAL LOW (ref 36.0–46.0)
Hemoglobin: 9.4 g/dL — ABNORMAL LOW (ref 12.0–15.0)
Immature Granulocytes: 1 %
Lymphocytes Relative: 13 %
Lymphs Abs: 0.9 10*3/uL (ref 0.7–4.0)
MCH: 25.3 pg — ABNORMAL LOW (ref 26.0–34.0)
MCHC: 31 g/dL (ref 30.0–36.0)
MCV: 81.7 fL (ref 80.0–100.0)
Monocytes Absolute: 0.7 10*3/uL (ref 0.1–1.0)
Monocytes Relative: 11 %
Neutro Abs: 4.8 10*3/uL (ref 1.7–7.7)
Neutrophils Relative %: 74 %
Platelet Count: 193 10*3/uL (ref 150–400)
RBC: 3.71 MIL/uL — ABNORMAL LOW (ref 3.87–5.11)
RDW: 17 % — ABNORMAL HIGH (ref 11.5–15.5)
WBC Count: 6.6 10*3/uL (ref 4.0–10.5)
nRBC: 0 % (ref 0.0–0.2)

## 2022-03-07 LAB — CMP (CANCER CENTER ONLY)
ALT: 5 U/L (ref 0–44)
AST: 12 U/L — ABNORMAL LOW (ref 15–41)
Albumin: 3.6 g/dL (ref 3.5–5.0)
Alkaline Phosphatase: 90 U/L (ref 38–126)
Anion gap: 3 — ABNORMAL LOW (ref 5–15)
BUN: 11 mg/dL (ref 8–23)
CO2: 28 mmol/L (ref 22–32)
Calcium: 9 mg/dL (ref 8.9–10.3)
Chloride: 105 mmol/L (ref 98–111)
Creatinine: 0.69 mg/dL (ref 0.44–1.00)
GFR, Estimated: >60 mL/min (ref >60)
Glucose, Bld: 98 mg/dL (ref 70–99)
Potassium: 3.9 mmol/L (ref 3.5–5.1)
Sodium: 136 mmol/L (ref 135–145)
Total Bilirubin: 0.4 mg/dL (ref 0.3–1.2)
Total Protein: 6.6 g/dL (ref 6.5–8.1)

## 2022-03-07 LAB — CEA (IN HOUSE-CHCC): CEA (CHCC-In House): 22.9 ng/mL — ABNORMAL HIGH (ref 0.00–5.00)

## 2022-03-07 LAB — TOTAL PROTEIN, URINE DIPSTICK: Protein, ur: <30 mg/dL

## 2022-03-07 MED ORDER — SODIUM CHLORIDE 0.9 % IV SOLN
150.0000 mg | Freq: Once | INTRAVENOUS | Status: AC
  Administered 2022-03-07: 150 mg via INTRAVENOUS
  Filled 2022-03-07: qty 150

## 2022-03-07 MED ORDER — SODIUM CHLORIDE 0.9 % IV SOLN
1800.0000 mg/m2 | INTRAVENOUS | Status: DC
  Administered 2022-03-07: 2700 mg via INTRAVENOUS
  Filled 2022-03-07: qty 54

## 2022-03-07 MED ORDER — SODIUM CHLORIDE 0.9 % IV SOLN
5.0000 mg/kg | Freq: Once | INTRAVENOUS | Status: AC
  Administered 2022-03-07: 250 mg via INTRAVENOUS
  Filled 2022-03-07: qty 10

## 2022-03-07 MED ORDER — HEPARIN SOD (PORK) LOCK FLUSH 100 UNIT/ML IV SOLN: 500.0000 [IU] | Freq: Once | INTRAVENOUS | Status: DC

## 2022-03-07 MED ORDER — SODIUM CHLORIDE 0.9 % IV SOLN
110.0000 mg/m2 | Freq: Once | INTRAVENOUS | Status: AC
  Administered 2022-03-07: 160 mg via INTRAVENOUS
  Filled 2022-03-07: qty 8

## 2022-03-07 MED ORDER — SODIUM CHLORIDE 0.9% FLUSH: 10.0000 mL | INTRAVENOUS | Status: DC | PRN

## 2022-03-07 MED ORDER — SODIUM CHLORIDE 0.9 % IV SOLN
400.0000 mg/m2 | Freq: Once | INTRAVENOUS | Status: AC
  Administered 2022-03-07: 604 mg via INTRAVENOUS
  Filled 2022-03-07: qty 30.2

## 2022-03-07 MED ORDER — ATROPINE SULFATE 1 MG/ML IV SOLN
0.5000 mg | Freq: Once | INTRAVENOUS | Status: AC | PRN
  Administered 2022-03-07: 0.5 mg via INTRAVENOUS
  Filled 2022-03-07: qty 1

## 2022-03-07 MED ORDER — SODIUM CHLORIDE 0.9% FLUSH
10.0000 mL | Freq: Once | INTRAVENOUS | Status: AC
  Administered 2022-03-07: 10 mL

## 2022-03-07 MED ORDER — SODIUM CHLORIDE 0.9 % IV SOLN
10.0000 mg | Freq: Once | INTRAVENOUS | Status: AC
  Administered 2022-03-07: 10 mg via INTRAVENOUS
  Filled 2022-03-07: qty 10

## 2022-03-07 MED ORDER — HEPARIN SOD (PORK) LOCK FLUSH 100 UNIT/ML IV SOLN: 500.0000 [IU] | Freq: Once | INTRAVENOUS | Status: DC | PRN

## 2022-03-07 MED ORDER — PALONOSETRON HCL INJECTION 0.25 MG/5ML
0.2500 mg | Freq: Once | INTRAVENOUS | Status: AC
  Administered 2022-03-07: 0.25 mg via INTRAVENOUS
  Filled 2022-03-07: qty 5

## 2022-03-07 MED ORDER — SODIUM CHLORIDE 0.9 % IV SOLN: Freq: Once | INTRAVENOUS | Status: AC

## 2022-03-07 NOTE — Progress Notes (Signed)
[facility]   [phone] [phone]   Clinic Follow up Note   Patient Care Team: Patient, No Pcp Per as PCP - General (General Practice) [name], NP as Nurse Practitioner (Oncology) [name], MD as Consulting Physician (Oncology) [name], RN (Inactive) as Oncology Nurse Navigator  Date of Service:  03/07/2022  CHIEF COMPLAINT: f/u of metastatic rectal cancer, h/o colon cancer  CURRENT THERAPY:  FOLFIRI and bevacizumab, q2weeks, restarted 12/27/21  ASSESSMENT & PLAN:  Brittney Spencer is a 71 y.o. female with   1. Rectal adenocarcinoma, with liver and right pelvic metastasis, recurrence from previous colon cancer vs new primary   -Diagnosed on routine screening colonoscopy 05/2020, rectal mass biopsy showed invasive adenocarcinoma, arising from a tubular adenoma -Her 07/12/20 PET showed positive uptake of known rectal tumor and soft tissue mass within the right iliac fossa indicating recurrent prior colon cancer. There is also left lobe of liver lesion is FDG avid concerning for liver metastasis. pt declined liver biopsy -whether this is recurrent/metastatic colon or metastatic rectal cancer, this is not curable but still treatable. She previously declined referral for HIPEC -She began first-line chemo with dose-reduced FOLFIRI on 08/06/20, Bevacizumab added with cycle 2. -She changed to maintenance therapy with irinotecan and bevacizumab every 2 weeks on 01/24/21. -restaging CT CAP 12/10/21 showed: new 1.5 cm right hepatic lobe metastasis which is not visible on last CT 3 months ago; stable soft tissue thickening in right iliac fossa and pulmonary nodules.  -Her CEA has also been slowly rising, up to 11.33 on 12/13/21. 5FU was added back to her treatment on 12/27/21. -she is tolerating better at reduced dose. Labs reviewed, hgb very stable at 9.4, urine protein negative. Will continue at same reduced dose. -plan for restaging CT in 3-4  weeks   2. Cancer of ascending colon, pT4bN1bM0, stage IIIC, MSI-stable, (+) surgical margins at pelvic wall     -diagnosed in 12/2015. S/p right hemicolectomy with right salpingo oophorectomy and adjuvant ChemoRT. -Her 04/2019 CT scan was NED  -With recent rectal cancer diagnosis, her PET from 07/12/20 indicates soft tissue mass within the right iliac fossa is noted and consistent with local tumor recurrence from previous ascending colon tumor.   3. Mild Anemia -Has been stable and mild for the past year.  -worse lately due to rectal cancer and starting chemo -denies bleeding -monitoring, stable in 9-range.   4. HTN, Anxiety -She'll follow-up with her primary care physician and continue medication -She uses half tab Xanax as needed, mostly only on treatment days. -stable     PLAN: -proceed with C4 FOLFIRI/beva today at same dose reduction              -[name] injection on day 3 with pump d/c -restaging CT before next cycle -lab, flush, f/u, and FOLFIRI/beva every 2 weeks   No problem-specific Assessment & Plan notes found for this encounter.   SUMMARY OF ONCOLOGIC HISTORY: Oncology History Overview Note  Cancer Staging Cancer of ascending colon [facility]) Staging form: Colon and Rectum, AJCC 7th Edition - Clinical stage from 02/07/2016: Stage IIIC (T4b, N1b, M0) - Signed by [name], MD on 03/04/2016 Laterality: Right Residual tumor (R): R2 - Macroscopic    Cancer of ascending colon ([place])  10/25/2015 Imaging   CT ABD/PELVIS:  Inflammatory changes inferior to the cecal tip appear improved, there is still irregular soft tissue thickening of the cecal tip, and there are adjacent prominent lymph nodes in the  ileocolonic mesentery, measuring 13 mm on image 49 and 8 mm on image 52. In addition, there is a 2.5 x 1.8 cm nodule on image 46 which has central low density. Therefore, these findings are moderately suspicious for an underlying cecal malignancy with perforation.     01/19/2016 Procedure   COLONOSCOPY per Dr. [name]: Fungating, ulcerated mass almost obstructing mid ascending colon   01/19/2016 Initial Biopsy   Diagnosis Surgical [P], cecal mass - INVASIVE ADENOCARCINOMA WITH ULCERATION. - SEE COMMENT.   02/05/2016 Tumor Marker   Patient's tumor was tested for the following markers: CEA Results of the tumor marker test revealed 5.7.   02/07/2016 Initial Diagnosis   Cancer of ascending colon ([place])   02/07/2016 Definitive Surgery   Laparoscopic assisted right hemicolectomy and right salpingo oopherectomy--Dr. [name]   02/07/2016 Pathologic Stage   p T4 N1b   2/43 nodes +   02/07/2016 Pathology Results   MMR normal; G2 adenocarcinoma;proximal & distal margins negative; soft tissue mass on pelvic sidewall + for adenocarcinoma with positive margin MSI Stable   03/08/2016 Imaging   CT chest negative for metastasis.    03/19/2016 - 04/25/2016 Radiation Therapy   Adjuvant irradiation, 50 gray in 28 fractions   03/19/2016 - 04/22/2016 Chemotherapy   Xeloda 1500 mg twice daily, started on 03/19/2016, dose reduced to 1000 mg twice daily from week 3 due to neutropenia, and patient stopped 3 days before last dose radiation due to difficulty swallowing the pill    05/20/2016 -  Adjuvant Chemotherapy   Patient declined adjuvant chemotherapy   09/16/2016 Imaging   CT CAP w Contrast 1. No evidence of local tumor recurrence at the ileocolic anastomosis. 2. No findings suspicious for metastatic disease in the chest, abdomen or pelvis. 3. Nonspecific trace free fluid in the pelvic cul-de-sac. 4. Stable solitary 3 mm right upper lobe pulmonary nodule, for which 6 month stability has been demonstrated, probably benign. 5. Additional findings include stable right posterior pericardial cyst and small calcified uterine fibroids.   05/13/2017 Imaging   CT CAP W Contrast 05/13/17 IMPRESSION: 1. No current findings of residual or recurrent malignancy. 2. Mild  prominence of stool throughout the colon. Nondistended portions of the rectum. 3. Several tiny pulmonary nodules are stable from the earliest available comparison of 03/08/2016 and probably benign, but may merit surveillance. 4. Other imaging findings of potential clinical significance: Old granulomatous disease. Aortoiliac atherosclerotic vascular disease. Lumbar spondylosis and degenerative disc disease. Stable amount of trace free pelvic fluid.   04/27/2018 Imaging   04/27/2018 CT CAP IMPRESSION: Stable exam. No evidence of recurrent or metastatic carcinoma within the chest, abdomen, or pelvis   04/19/2019 Imaging   CT CAP W Contrast  IMPRESSION: Chest Impression:   1. No evidence of thoracic metastasis. 2. Stable small bilateral pulmonary nodules.   Abdomen / Pelvis Impression:   1. No evidence local colorectal carcinoma recurrence or metastasis in the abdomen pelvis. 2. Post RIGHT hemicolectomy.   01/22/2021 Imaging   CT CAP  IMPRESSION: CT CHEST IMPRESSION   1. Similar nonspecific pulmonary nodules. 2. New posterior left upper lobe reticulonodular opacity, suspicious for interval mild infection or inflammation. 3. No thoracic adenopathy.   CT ABDOMEN AND PELVIS IMPRESSION   1. Further decrease in size of high left hepatic lobe 3 mm low-density lesion. No new or progressive metastatic disease within the abdomen or pelvis. 2. Similar trace free pelvic fluid. 3. Similar nonspecific mid rectal wall thickening. 4.  Aortic Atherosclerosis (ICD10-I70.0).   04/23/2021  Imaging   CT CAP  IMPRESSION: 1. Treated metastatic lesion between segments 2 and 3 of the liver, slightly smaller and less distinct than prior examination. No other signs of definite metastatic disease elsewhere in the abdomen or pelvis. 2. Multiple small pulmonary nodules, stable compared to the prior examination, favored to be benign. No definitive findings to suggest metastatic disease to the  thorax. 3. Aortic atherosclerosis. 4. Additional incidental findings, as above.   08/13/2021 Imaging   EXAM: CT CHEST, ABDOMEN, AND PELVIS WITH CONTRAST  IMPRESSION: 1. A previously seen PET avid lesion of the anterior left lobe of the liver, hepatic segment II, is no longer discretely appreciable consistent with treatment response of a hepatic metastasis. 2. No evidence of new metastatic disease in the chest, abdomen, or pelvis. 3. Interval increase in a small focus of consolidation and nodularity of the medial left upper lobe, consistent with minimal, ongoing atypical infection. Additional tiny bilateral pulmonary nodules are stable and almost certainly incidental benign. Attention on follow-up. 4. Status post right hemicolectomy and ileocolic anastomosis.   Rectal cancer metastasized to liver ([place])  06/15/2020 Procedure   Screening Colonoscopy by Dr [name]  IMPRESSION - Decreased sphincter tone and internal hemorrhoids that prolapse with straining, but require manual replacement into the anal canal (Grade III) found on digital rectal exam. - Patent side-to-side ileo-colonic anastomosis, characterized by healthy appearing mucosa. - The examined portion of the ileum was normal. - One diminutive polyp in the proximal transverse colon, removed with a cold biopsy forceps. Resected and retrieved. - Likely malignant partially obstructing tumor in the mid rectum. Biopsied. Tattooed. - The examination was otherwise normal on direct and retroflexion views.   06/15/2020 Initial Biopsy   Diagnosis 1. Transverse Colon Polyp - HYPERPLASTIC POLYP 2. Rectum, biopsy - ADENOCARCINOMA ARISING IN A TUBULAR ADENOMA WITH HIGH-GRADE DYSPLASIA. SEE NOTE Diagnosis Note 2. Dr. [name] reviewed the case and concurs with the diagnosis. Dr. [name] was notified on 06/16/2020.   06/28/2020 Imaging   CT CAP  IMPRESSION: 1. New low-density focus in the anterior aspect of the lateral segment LEFT hepatic  lobe measuring 1.2 x 1.0 cm, compatible with small metastatic lesion in the LEFT hepatic lobe. 2. Soft tissue in the RIGHT iliac fossa following RIGHT hemicolectomy invades the psoas musculature and is slowly enlarging over time, more linear on the prior study now highly concerning for recurrence/metastasis to this location. 3. Signs of enteritis, potentially post radiation changes of the small bowel. Tethered small bowel in the RIGHT lower quadrant shows focal thickening and narrowing suspicious for small bowel involvement and developing partial obstruction though currently contrast passes beyond this point into the colon. 4. Rectal thickening in this patient with known rectal mass as described. 5. No evidence of metastatic disease in the chest. 6. Stable small pulmonary nodules. 7.  and aortic atherosclerosis.   Aortic Atherosclerosis (ICD10-I70.0) and Emphysema (ICD10-J43.9).   07/04/2020 Initial Diagnosis   Rectal cancer metastasized to liver ([place])   07/12/2020 PET scan   IMPRESSION: 1. Exam positive for FDG avid rectal tumor which corresponds to the recent colonoscopy findings. 2. FDG avid soft tissue mass within the right iliac fossa is noted and consistent with local tumor recurrence from previous ascending colon tumor. 3. Lateral segment left lobe of liver lesion is FDG avid concerning for liver metastasis. 4. No specific findings identified to suggest metastatic disease to the chest.   08/10/2020 -  Chemotherapy   First-line FOLFIRI q2weeks starting 08/10/20. dose reduced with cycle 1.  Irinotecan/5FU increased and Bevacizumab added with cycle 2 on 08/23/2020    08/17/2020 -  Chemotherapy   Patient is on Treatment Plan : COLORECTAL FOLFIRI + Bevacizumab q14d     10/27/2020 Imaging   CT CAP  IMPRESSION: 1. Interval decrease in size of the hypermetabolic left hepatic lesion, consistent with metastatic disease. No new liver lesion evident. 2. Interval resolution of the  hypermetabolic soft tissue lesion along the right iliac fossa with no measurable soft tissue lesion remaining at this location today. 3. Similar appearance of soft tissue fullness in the rectum at the site of the hypermetabolic lesion seen previously. 4. Stable tiny bilateral pulmonary nodules. Continued attention on follow-up recommended. 5. Small volume free fluid in the pelvis. 6. Aortic Atherosclerosis (ICD10-I70.0).   01/22/2021 Imaging   CT CAP  IMPRESSION: CT CHEST IMPRESSION   1. Similar nonspecific pulmonary nodules. 2. New posterior left upper lobe reticulonodular opacity, suspicious for interval mild infection or inflammation. 3. No thoracic adenopathy.   CT ABDOMEN AND PELVIS IMPRESSION   1. Further decrease in size of high left hepatic lobe 3 mm low-density lesion. No new or progressive metastatic disease within the abdomen or pelvis. 2. Similar trace free pelvic fluid. 3. Similar nonspecific mid rectal wall thickening. 4.  Aortic Atherosclerosis (ICD10-I70.0).   04/23/2021 Imaging   CT CAP  IMPRESSION: 1. Treated metastatic lesion between segments 2 and 3 of the liver, slightly smaller and less distinct than prior examination. No other signs of definite metastatic disease elsewhere in the abdomen or pelvis. 2. Multiple small pulmonary nodules, stable compared to the prior examination, favored to be benign. No definitive findings to suggest metastatic disease to the thorax. 3. Aortic atherosclerosis. 4. Additional incidental findings, as above.   08/13/2021 Imaging   EXAM: CT CHEST, ABDOMEN, AND PELVIS WITH CONTRAST  IMPRESSION: 1. A previously seen PET avid lesion of the anterior left lobe of the liver, hepatic segment II, is no longer discretely appreciable consistent with treatment response of a hepatic metastasis. 2. No evidence of new metastatic disease in the chest, abdomen, or pelvis. 3. Interval increase in a small focus of consolidation  and nodularity of the medial left upper lobe, consistent with minimal, ongoing atypical infection. Additional tiny bilateral pulmonary nodules are stable and almost certainly incidental benign. Attention on follow-up. 4. Status post right hemicolectomy and ileocolic anastomosis.      INTERVAL HISTORY:  Brittney Spencer is here for a follow up of metastatic rectal cancer. She was last seen by me on 02/21/22. She presents to the clinic alone. She reports she is doing well overall. She endorses drinking Ensure about twice a day.   All other systems were reviewed with the patient and are negative.  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 Some residual intraretinal fluid remains, very small localized subfoveal of fluid remains  although this continues to slowly resorb. We'll continue to observe.   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  ALLERGIES:  is allergic to fish allergy, peanut-containing drug products, soy allergy, and buspirone.  MEDICATIONS:  Current Outpatient Medications  Medication Sig Dispense Refill   ALPRAZolam (XANAX) 0.25 MG tablet Take 1 tablet (0.25 mg total) by mouth daily as needed for anxiety. 30 tablet 0   Cholecalciferol (VITAMIN D3 PO) Take by mouth daily.     docusate sodium (COLACE) 100 MG capsule 1 capsule as needed     lidocaine-prilocaine (EMLA) cream Apply 1 application topically as needed. 30 g 1   NORVASC 2.5 MG tablet Take 1 tablet (2.5 mg total) by mouth daily. 90 tablet 1   ondansetron (ZOFRAN) 8 MG tablet Take 1 tablet (8 mg total) by mouth every 8 (eight) hours as needed for nausea or vomiting. 20 tablet 2   prochlorperazine (COMPAZINE) 10 MG tablet Take 1 tablet (10 mg total) by mouth every 6 (six) hours as needed for nausea or vomiting. 30 tablet 2   Current Facility-Administered Medications  Medication Dose Route Frequency Provider Last Rate Last Admin   0.9 %  sodium chloride infusion  500 mL Intravenous Once Doran Stabler, MD       Facility-Administered Medications Ordered in Other Visits  Medication Dose Route Frequency Provider Last Rate Last Admin   atropine injection 0.5 mg  0.5 mg Intravenous Once PRN [name], MD       fluorouracil (ADRUCIL) 2,700 mg in sodium chloride 0.9 % 96 mL chemo infusion  1,800 mg/m2 (Treatment Plan Recorded) Intravenous 1 day or 1 dose [name], MD       heparin lock flush 100 unit/mL  500 Units Intracatheter Once PRN [name], MD       irinotecan (CAMPTOSAR) 160 mg in sodium chloride 0.9 % 500 mL chemo infusion  110 mg/m2 (Treatment Plan Recorded) Intravenous Once [name], MD       leucovorin 604 mg in sodium chloride 0.9 % 250 mL infusion  400 mg/m2 (Treatment Plan Recorded) Intravenous Once [name], MD        sodium chloride flush (NS) 0.9 % injection 10 mL  10 mL Intracatheter PRN [name], MD        PHYSICAL EXAMINATION: ECOG PERFORMANCE STATUS: 1 - Symptomatic but completely ambulatory  Vitals:   03/07/22 0926  BP: 130/76  Pulse: 96  Resp: 18  Temp: 98.7 F (37.1 C)  SpO2: 100%   Wt Readings from Last 3 Encounters:  03/07/22 108 lb (49 kg)  02/21/22 109 lb 12.8 oz (49.8 kg)  02/07/22 108 lb 8 oz (49.2 kg)     GENERAL:alert, no distress and comfortable SKIN: skin color normal, no rashes or significant lesions EYES: normal, Conjunctiva are pink and non-injected, sclera clear  NEURO: alert &  oriented x 3 with fluent speech  LABORATORY DATA:  I have reviewed the data as listed    Latest Ref Rng & Units 03/07/2022    8:50 AM 02/21/2022    8:43 AM 02/07/2022    9:20 AM  CBC  WBC 4.0 - 10.5 K/uL 6.6  8.9  7.4   Hemoglobin 12.0 - 15.0 g/dL 9.4  9.4  9.4   Hematocrit 36.0 - 46.0 % 30.3  30.2  29.8   Platelets 150 - 400 K/uL 193  240  157         Latest Ref Rng & Units 03/07/2022    8:50 AM 02/21/2022    8:43 AM 02/07/2022    9:20 AM  CMP  Glucose 70 - 99 mg/dL 98  87  91   BUN 8 - 23 mg/dL _0 Creatinine 0.44 - 1.00 mg/dL 0.69  0.53  0.66   Sodium 135 - 145 mmol/L 136  138  138   Potassium 3.5 - 5.1 mmol/L 3.9  4.0  3.8   Chloride 98 - 111 mmol/L 105  106  106   CO2 22 - 32 mmol/L _1 Calcium 8.9 - 10.3 mg/dL 9.0  9.1  8.6   Total Protein 6.5 - 8.1 g/dL 6.6  6.6  6.7   Total Bilirubin 0.3 - 1.2 mg/dL 0.4  0.2  0.3   Alkaline Phos 38 - 126 U/L 90  96  81   AST 15 - 41 U/L _2 ALT 0 - 44 U/L _3 RADIOGRAPHIC STUDIES: I have personally reviewed the radiological images as listed and agreed with the findings in the report. No results found.    Orders Placed This Encounter  Procedures   CEA (IN HOUSE-[facility])    Standing Status:   Standing    Number of Occurrences:   20    Standing Expiration Date:   03/07/2023   All questions were  answered. The patient knows to call the clinic with any problems, questions or concerns. No barriers to learning was detected. The total time spent in the appointment was 30 minutes.     [name], MD 03/07/2022   I, Wilburn Mylar, am acting as scribe for [name], MD.   I have reviewed the above documentation for accuracy and completeness, and I agree with the above.

## 2022-03-07 NOTE — Patient Instructions (Signed)
[facility] MEDICAL ONCOLOGY  Discharge Instructions: Thank you for choosing [facility] to provide your oncology and hematology care.   If you have a lab appointment with the Cancer Center, please go directly to the Cancer Center and check in at the registration area.   Wear comfortable clothing and clothing appropriate for easy access to any Portacath or PICC line.   We strive to give you quality time with your provider. You may need to reschedule your appointment if you arrive late (15 or more minutes).  Arriving late affects you and other patients whose appointments are after yours.  Also, if you miss three or more appointments without notifying the office, you may be dismissed from the clinic at the provider's discretion.      For prescription refill requests, have your pharmacy contact our office and allow 72 hours for refills to be completed.    Today you received the following chemotherapy and/or immunotherapy agents: Bevacizumab, Lecovorin, Irinotecan, and Fluorouracil   To help prevent nausea and vomiting after your treatment, we encourage you to take your nausea medication as directed.  BELOW ARE SYMPTOMS THAT SHOULD BE REPORTED IMMEDIATELY: *FEVER GREATER THAN 100.4 F (38 C) OR HIGHER *CHILLS OR SWEATING *NAUSEA AND VOMITING THAT IS NOT CONTROLLED WITH YOUR NAUSEA MEDICATION *UNUSUAL SHORTNESS OF BREATH *UNUSUAL BRUISING OR BLEEDING *URINARY PROBLEMS (pain or burning when urinating, or frequent urination) *BOWEL PROBLEMS (unusual diarrhea, constipation, pain near the anus) TENDERNESS IN MOUTH AND THROAT WITH OR WITHOUT PRESENCE OF ULCERS (sore throat, sores in mouth, or a toothache) UNUSUAL RASH, SWELLING OR PAIN  UNUSUAL VAGINAL DISCHARGE OR ITCHING   Items with * indicate a potential emergency and should be followed up as soon as possible or go to the Emergency Department if any problems should occur.  Please show the CHEMOTHERAPY ALERT CARD or  IMMUNOTHERAPY ALERT CARD at check-in to the Emergency Department and triage nurse.  Should you have questions after your visit or need to cancel or reschedule your appointment, please contact [facility]  Dept: [phone]  and follow the prompts.  Office hours are 8:00 a.m. to 4:30 p.m. Monday - Friday. Please note that voicemails left after 4:00 p.m. may not be returned until the following business day.  We are closed weekends and major holidays. You have access to a nurse at all times for urgent questions. Please call the main number to the clinic Dept: [phone] and follow the prompts.   For any non-urgent questions, you may also contact your provider using MyChart. We now offer e-Visits for anyone 18 and older to request care online for non-urgent symptoms. For details visit [email or web address].   Also download the MyChart app! Go to the app store, search "MyChart", open the app, select , and log in with your MyChart username and password.  Masks are optional in the cancer centers. If you would like for your care team to wear a mask while they are taking care of you, please let them know. You may have one support person who is at least 73 years old accompany you for your appointments. The chemotherapy medication bag should finish at 46 hours, 96 hours, or 7 days. For example, if your pump is scheduled for 46 hours and it was put on at 4:00 p.m., it should finish at 2:00 p.m. the day it is scheduled to come off regardless of your appointment time.     Estimated time to finish at:     If the display on your pump reads "Low Volume" and it is beeping, take the batteries out of the pump and come to the cancer center for it to be taken off.   If the pump alarms go off prior to the pump reading "Low Volume" then call [phone] and someone can assist you.  If the plunger comes out and the chemotherapy medication is leaking out, please use your home  chemo spill kit to clean up the spill. Do NOT use paper towels or other household products.  If you have problems or questions regarding your pump, please call either [phone] (24 hours a day) or the cancer center Monday-Friday 8:00 a.m.- 4:30 p.m. at the clinic number and we will assist you. If you are unable to get assistance, then go to the nearest Emergency Department and ask the staff to contact the IV team for assistance.   

## 2022-03-09 ENCOUNTER — Inpatient Hospital Stay: Payer: Federal, State, Local not specified - PPO

## 2022-03-09 VITALS — BP 131/68 | HR 78 | Temp 98.6°F | Resp 18 | Ht 66.0 in

## 2022-03-09 DIAGNOSIS — Z5112 Encounter for antineoplastic immunotherapy: Secondary | ICD-10-CM | POA: Diagnosis not present

## 2022-03-09 DIAGNOSIS — C20 Malignant neoplasm of rectum: Secondary | ICD-10-CM

## 2022-03-09 MED ORDER — HEPARIN SOD (PORK) LOCK FLUSH 100 UNIT/ML IV SOLN
500.0000 [IU] | Freq: Once | INTRAVENOUS | Status: AC | PRN
  Administered 2022-03-09: 500 [IU]

## 2022-03-09 MED ORDER — SODIUM CHLORIDE 0.9% FLUSH
10.0000 mL | INTRAVENOUS | Status: DC | PRN
  Administered 2022-03-09: 10 mL

## 2022-03-09 MED ORDER — PEGFILGRASTIM-CBQV 6 MG/0.6ML ~~LOC~~ SOSY
6.0000 mg | PREFILLED_SYRINGE | Freq: Once | SUBCUTANEOUS | Status: AC
  Administered 2022-03-09: 6 mg via SUBCUTANEOUS

## 2022-03-13 ENCOUNTER — Telehealth: Payer: Self-pay

## 2022-03-13 NOTE — Telephone Encounter (Signed)
This nurse returned call to this patient related to message left stating that she needs to know when her CT scan is scheduled for because she received a message stating that the CT scan could not be scheduled and she needs clarity.  Per patient request this nurse left a voicemail message with CT appointment date and time, also advised of Telephone visit that is scheduled to review the CT results   Patient knows to call and reschedule if appointments do not fit her schedule.  No further concerns noted at this time.

## 2022-03-18 ENCOUNTER — Ambulatory Visit (HOSPITAL_COMMUNITY)
Admission: RE | Admit: 2022-03-18 | Discharge: 2022-03-18 | Disposition: A | Discharge status: Home / Self Care | Payer: Federal, State, Local not specified - PPO | Source: Ambulatory Visit | Attending: Hematology | Admitting: Hematology

## 2022-03-18 DIAGNOSIS — C20 Malignant neoplasm of rectum: Secondary | ICD-10-CM | POA: Diagnosis not present

## 2022-03-18 DIAGNOSIS — C787 Secondary malignant neoplasm of liver and intrahepatic bile duct: Secondary | ICD-10-CM | POA: Insufficient documentation

## 2022-03-18 MED ORDER — SODIUM CHLORIDE (PF) 0.9 % IJ SOLN
INTRAMUSCULAR | Status: AC
  Filled 2022-03-18: qty 50

## 2022-03-18 MED ORDER — IOHEXOL 300 MG/ML  SOLN
100.0000 mL | Freq: Once | INTRAMUSCULAR | Status: AC | PRN
  Administered 2022-03-18: 100 mL via INTRAVENOUS

## 2022-03-21 ENCOUNTER — Encounter: Payer: Self-pay | Admitting: Hematology

## 2022-03-21 ENCOUNTER — Inpatient Hospital Stay (HOSPITAL_BASED_OUTPATIENT_CLINIC_OR_DEPARTMENT_OTHER): Payer: Federal, State, Local not specified - PPO | Admitting: Hematology

## 2022-03-21 DIAGNOSIS — C787 Secondary malignant neoplasm of liver and intrahepatic bile duct: Secondary | ICD-10-CM

## 2022-03-21 DIAGNOSIS — C20 Malignant neoplasm of rectum: Secondary | ICD-10-CM

## 2022-03-21 DIAGNOSIS — D630 Anemia in neoplastic disease: Secondary | ICD-10-CM

## 2022-03-21 DIAGNOSIS — I10 Essential (primary) hypertension: Secondary | ICD-10-CM | POA: Diagnosis not present

## 2022-03-21 DIAGNOSIS — F419 Anxiety disorder, unspecified: Secondary | ICD-10-CM

## 2022-03-21 NOTE — Progress Notes (Signed)
Brittney Spencer   [phone] [phone]   Clinic Follow up Note   Patient Care Team: Patient, No Pcp Per as PCP - General (General Practice) [name], NP as Nurse Practitioner (Oncology) [name], MD as Consulting Physician (Oncology) [name], RN (Inactive) as Oncology Nurse Navigator  Date of Service:  03/21/2022  I connected with Brittney Spencer on 03/21/2022 at  1:00 PM EDT by telephone visit and verified that I am speaking with the correct person using two identifiers.  I discussed the limitations, risks, security and privacy concerns of performing an evaluation and management service by telephone and the availability of in person appointments. I also discussed with the patient that there may be a patient responsible charge related to this service. The patient expressed understanding and agreed to proceed.   Other persons participating in the visit and their role in the encounter:  none  Patient's location:  home Provider's location:  my office  CHIEF COMPLAINT: review recent scan results, f/u of metastatic rectal cancer, h/o colon cancer  CURRENT THERAPY:  To change to FOLFOX with bevacizumab, q14d, starting 03/28/22  ASSESSMENT & PLAN:  Brittney Spencer is a 71 y.o. female with   1. Rectal adenocarcinoma, with liver and right pelvic metastasis, recurrence from previous colon cancer vs new primary   -Diagnosed on screening colonoscopy 05/2020, rectal mass biopsy showed invasive adenocarcinoma, arising from a tubular adenoma -Her 07/12/20 PET showed positive uptake of known rectal tumor and soft tissue mass within the right iliac fossa indicating recurrent prior colon cancer. There is also left lobe of liver lesion is FDG avid concerning for liver metastasis. pt declined liver biopsy -whether this is recurrent/metastatic colon or metastatic rectal cancer, this is not curable but still treatable. She previously declined  referral for HIPEC -She began first-line chemo with dose-reduced FOLFIRI on 08/06/20, Bevacizumab added with cycle 2. -She changed to maintenance therapy with irinotecan and bevacizumab every 2 weeks on 01/24/21. -restaging CT CAP 12/10/21 showed: new 1.5 cm right hepatic lobe metastasis . Her CEA had also been slowly rising, up to 11.33 on 12/13/21.  -5FU was added back to her treatment on 12/27/21. She is tolerating well at reduced dose. -restaging CT CAP 03/18/22 showed increase in size of right liver lobe metastasis, otherwise stable. Her CEA has continued to rise despite the addition of 5-FU, most recently 22.9 on 03/07/22. I reviewed the results with her today. -I recommend changing treatment. I discussed the different standard next-line treatments, and the recommendation is to switch her irinotecan to oxaliplatin. --Chemotherapy consent: Side effects including but does not not limited to, fatigue, nausea, vomiting, diarrhea, hair loss, neuropathy, fluid retention, renal and kidney dysfunction, neutropenic fever, needed for blood transfusion, bleeding, were discussed with patient in great detail. She agrees to proceed. -will plan to switch to FOLFOX/Beva on her next scheduled treatment.   2. Cancer of ascending colon, pT4bN1bM0, stage IIIC, MSI-stable, (+) surgical margins at pelvic wall     -diagnosed in 12/2015. S/p right hemicolectomy with right salpingo oophorectomy and adjuvant ChemoRT. -Her 04/2019 CT scan was NED  -With recent rectal cancer diagnosis, her PET from 07/12/20 indicates soft tissue mass within the right iliac fossa is noted and consistent with local tumor recurrence from previous ascending colon tumor.   3. Mild Anemia -Has been stable and mild for the past year.  -worse lately due to rectal cancer and starting chemo -denies bleeding -monitoring, stable in 9-range.  4. HTN, Anxiety -She'll follow-up with her primary care physician and continue medication -She uses half tab  Xanax as needed, mostly only on treatment days. -stable     PLAN: -lab and scan reviewed, she has cancer progression in liver, will stop FOLFIRI -lab, flush, f/u, and FOLFOX/beva 9/28, dose reduction due to her previous tolerance issue    No problem-specific Assessment & Plan notes found for this encounter.   SUMMARY OF ONCOLOGIC HISTORY: Oncology History Overview Note  Cancer Staging Cancer of ascending colon [facility]) Staging form: Colon and Rectum, AJCC 7th Edition - Clinical stage from 02/07/2016: Stage IIIC (T4b, N1b, M0) - Signed by [name], MD on 03/04/2016 Laterality: Right Residual tumor (R): R2 - Macroscopic    Cancer of ascending colon ([place])  10/25/2015 Imaging   CT ABD/PELVIS:  Inflammatory changes inferior to the cecal tip appear improved, there is still irregular soft tissue thickening of the cecal tip, and there are adjacent prominent lymph nodes in the ileocolonic mesentery, measuring 13 mm on image 49 and 8 mm on image 52. In addition, there is a 2.5 x 1.8 cm nodule on image 46 which has central low density. Therefore, these findings are moderately suspicious for an underlying cecal malignancy with perforation.    01/19/2016 Procedure   COLONOSCOPY per Dr. [name]: Fungating, ulcerated mass almost obstructing mid ascending colon   01/19/2016 Initial Biopsy   Diagnosis Surgical [P], cecal mass - INVASIVE ADENOCARCINOMA WITH ULCERATION. - SEE COMMENT.   02/05/2016 Tumor Marker   Patient's tumor was tested for the following markers: CEA Results of the tumor marker test revealed 5.7.   02/07/2016 Initial Diagnosis   Cancer of ascending colon ([place])   02/07/2016 Definitive Surgery   Laparoscopic assisted right hemicolectomy and right salpingo oopherectomy--Dr. [name]   02/07/2016 Pathologic Stage   p T4 N1b   2/43 nodes +   02/07/2016 Pathology Results   MMR normal; G2 adenocarcinoma;proximal & distal margins negative; soft tissue mass on pelvic sidewall + for  adenocarcinoma with positive margin MSI Stable   03/08/2016 Imaging   CT chest negative for metastasis.    03/19/2016 - 04/25/2016 Radiation Therapy   Adjuvant irradiation, 50 gray in 28 fractions   03/19/2016 - 04/22/2016 Chemotherapy   Xeloda 1500 mg twice daily, started on 03/19/2016, dose reduced to 1000 mg twice daily from week 3 due to neutropenia, and patient stopped 3 days before last dose radiation due to difficulty swallowing the pill    05/20/2016 -  Adjuvant Chemotherapy   Patient declined adjuvant chemotherapy   09/16/2016 Imaging   CT CAP w Contrast 1. No evidence of local tumor recurrence at the ileocolic anastomosis. 2. No findings suspicious for metastatic disease in the chest, abdomen or pelvis. 3. Nonspecific trace free fluid in the pelvic cul-de-sac. 4. Stable solitary 3 mm right upper lobe pulmonary nodule, for which 6 month stability has been demonstrated, probably benign. 5. Additional findings include stable right posterior pericardial cyst and small calcified uterine fibroids.   05/13/2017 Imaging   CT CAP W Contrast 05/13/17 IMPRESSION: 1. No current findings of residual or recurrent malignancy. 2. Mild prominence of stool throughout the colon. Nondistended portions of the rectum. 3. Several tiny pulmonary nodules are stable from the earliest available comparison of 03/08/2016 and probably benign, but may merit surveillance. 4. Other imaging findings of potential clinical significance: Old granulomatous disease. Aortoiliac atherosclerotic vascular disease. Lumbar spondylosis and degenerative disc disease. Stable amount of trace free pelvic fluid.   04/27/2018 Imaging  04/27/2018 CT CAP IMPRESSION: Stable exam. No evidence of recurrent or metastatic carcinoma within the chest, abdomen, or pelvis   04/19/2019 Imaging   CT CAP W Contrast  IMPRESSION: Chest Impression:   1. No evidence of thoracic metastasis. 2. Stable small bilateral pulmonary  nodules.   Abdomen / Pelvis Impression:   1. No evidence local colorectal carcinoma recurrence or metastasis in the abdomen pelvis. 2. Post RIGHT hemicolectomy.   01/22/2021 Imaging   CT CAP  IMPRESSION: CT CHEST IMPRESSION   1. Similar nonspecific pulmonary nodules. 2. New posterior left upper lobe reticulonodular opacity, suspicious for interval mild infection or inflammation. 3. No thoracic adenopathy.   CT ABDOMEN AND PELVIS IMPRESSION   1. Further decrease in size of high left hepatic lobe 3 mm low-density lesion. No new or progressive metastatic disease within the abdomen or pelvis. 2. Similar trace free pelvic fluid. 3. Similar nonspecific mid rectal wall thickening. 4.  Aortic Atherosclerosis (ICD10-I70.0).   04/23/2021 Imaging   CT CAP  IMPRESSION: 1. Treated metastatic lesion between segments 2 and 3 of the liver, slightly smaller and less distinct than prior examination. No other signs of definite metastatic disease elsewhere in the abdomen or pelvis. 2. Multiple small pulmonary nodules, stable compared to the prior examination, favored to be benign. No definitive findings to suggest metastatic disease to the thorax. 3. Aortic atherosclerosis. 4. Additional incidental findings, as above.   08/13/2021 Imaging   EXAM: CT CHEST, ABDOMEN, AND PELVIS WITH CONTRAST  IMPRESSION: 1. A previously seen PET avid lesion of the anterior left lobe of the liver, hepatic segment II, is no longer discretely appreciable consistent with treatment response of a hepatic metastasis. 2. No evidence of new metastatic disease in the chest, abdomen, or pelvis. 3. Interval increase in a small focus of consolidation and nodularity of the medial left upper lobe, consistent with minimal, ongoing atypical infection. Additional tiny bilateral pulmonary nodules are stable and almost certainly incidental benign. Attention on follow-up. 4. Status post right hemicolectomy and ileocolic  anastomosis.   Rectal cancer metastasized to liver ([place])  06/15/2020 Procedure   Screening Colonoscopy by Dr [name]  IMPRESSION - Decreased sphincter tone and internal hemorrhoids that prolapse with straining, but require manual replacement into the anal canal (Grade III) found on digital rectal exam. - Patent side-to-side ileo-colonic anastomosis, characterized by healthy appearing mucosa. - The examined portion of the ileum was normal. - One diminutive polyp in the proximal transverse colon, removed with a cold biopsy forceps. Resected and retrieved. - Likely malignant partially obstructing tumor in the mid rectum. Biopsied. Tattooed. - The examination was otherwise normal on direct and retroflexion views.   06/15/2020 Initial Biopsy   Diagnosis 1. Transverse Colon Polyp - HYPERPLASTIC POLYP 2. Rectum, biopsy - ADENOCARCINOMA ARISING IN A TUBULAR ADENOMA WITH HIGH-GRADE DYSPLASIA. SEE NOTE Diagnosis Note 2. Dr. [name] reviewed the case and concurs with the diagnosis. Dr. [name] was notified on 06/16/2020.   06/28/2020 Imaging   CT CAP  IMPRESSION: 1. New low-density focus in the anterior aspect of the lateral segment LEFT hepatic lobe measuring 1.2 x 1.0 cm, compatible with small metastatic lesion in the LEFT hepatic lobe. 2. Soft tissue in the RIGHT iliac fossa following RIGHT hemicolectomy invades the psoas musculature and is slowly enlarging over time, more linear on the prior study now highly concerning for recurrence/metastasis to this location. 3. Signs of enteritis, potentially post radiation changes of the small bowel. Tethered small bowel in the RIGHT lower quadrant shows focal  thickening and narrowing suspicious for small bowel involvement and developing partial obstruction though currently contrast passes beyond this point into the colon. 4. Rectal thickening in this patient with known rectal mass as described. 5. No evidence of metastatic disease in the  chest. 6. Stable small pulmonary nodules. 7.  and aortic atherosclerosis.   Aortic Atherosclerosis (ICD10-I70.0) and Emphysema (ICD10-J43.9).   07/04/2020 Initial Diagnosis   Rectal cancer metastasized to liver ([place])   07/12/2020 PET scan   IMPRESSION: 1. Exam positive for FDG avid rectal tumor which corresponds to the recent colonoscopy findings. 2. FDG avid soft tissue mass within the right iliac fossa is noted and consistent with local tumor recurrence from previous ascending colon tumor. 3. Lateral segment left lobe of liver lesion is FDG avid concerning for liver metastasis. 4. No specific findings identified to suggest metastatic disease to the chest.   08/10/2020 -  Chemotherapy   First-line FOLFIRI q2weeks starting 08/10/20. dose reduced with cycle 1. Irinotecan/5FU increased and Bevacizumab added with cycle 2 on 08/23/2020    08/17/2020 - 03/09/2022 Chemotherapy   Patient is on Treatment Plan : COLORECTAL FOLFIRI + Bevacizumab q14d     10/27/2020 Imaging   CT CAP  IMPRESSION: 1. Interval decrease in size of the hypermetabolic left hepatic lesion, consistent with metastatic disease. No new liver lesion evident. 2. Interval resolution of the hypermetabolic soft tissue lesion along the right iliac fossa with no measurable soft tissue lesion remaining at this location today. 3. Similar appearance of soft tissue fullness in the rectum at the site of the hypermetabolic lesion seen previously. 4. Stable tiny bilateral pulmonary nodules. Continued attention on follow-up recommended. 5. Small volume free fluid in the pelvis. 6. Aortic Atherosclerosis (ICD10-I70.0).   01/22/2021 Imaging   CT CAP  IMPRESSION: CT CHEST IMPRESSION   1. Similar nonspecific pulmonary nodules. 2. New posterior left upper lobe reticulonodular opacity, suspicious for interval mild infection or inflammation. 3. No thoracic adenopathy.   CT ABDOMEN AND PELVIS IMPRESSION   1. Further decrease in  size of high left hepatic lobe 3 mm low-density lesion. No new or progressive metastatic disease within the abdomen or pelvis. 2. Similar trace free pelvic fluid. 3. Similar nonspecific mid rectal wall thickening. 4.  Aortic Atherosclerosis (ICD10-I70.0).   04/23/2021 Imaging   CT CAP  IMPRESSION: 1. Treated metastatic lesion between segments 2 and 3 of the liver, slightly smaller and less distinct than prior examination. No other signs of definite metastatic disease elsewhere in the abdomen or pelvis. 2. Multiple small pulmonary nodules, stable compared to the prior examination, favored to be benign. No definitive findings to suggest metastatic disease to the thorax. 3. Aortic atherosclerosis. 4. Additional incidental findings, as above.   08/13/2021 Imaging   EXAM: CT CHEST, ABDOMEN, AND PELVIS WITH CONTRAST  IMPRESSION: 1. A previously seen PET avid lesion of the anterior left lobe of the liver, hepatic segment II, is no longer discretely appreciable consistent with treatment response of a hepatic metastasis. 2. No evidence of new metastatic disease in the chest, abdomen, or pelvis. 3. Interval increase in a small focus of consolidation and nodularity of the medial left upper lobe, consistent with minimal, ongoing atypical infection. Additional tiny bilateral pulmonary nodules are stable and almost certainly incidental benign. Attention on follow-up. 4. Status post right hemicolectomy and ileocolic anastomosis.   03/28/2022 -  Chemotherapy   Patient is on Treatment Plan : COLORECTAL FOLFOX + Bevacizumab q14d        INTERVAL HISTORY:  Brittney Spencer was contacted for a follow up of rectal cancer. She was last seen by me on 03/07/22. She reports she has done well with treatment overall.    All other systems were reviewed with the patient and are negative.  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 Some residual intraretinal fluid remains, very small localized subfoveal of fluid remains although this continues to slowly resorb. We'll continue to observe.   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  ALLERGIES:  is allergic to fish allergy, peanut-containing drug products, soy allergy, and buspirone.  MEDICATIONS:  Current Outpatient Medications  Medication Sig Dispense Refill   ALPRAZolam (XANAX) 0.25 MG tablet Take 1 tablet (0.25 mg total) by mouth daily as needed for anxiety. 30 tablet 0   Cholecalciferol (VITAMIN D3 PO) Take by mouth daily.     docusate sodium (COLACE) 100 MG capsule 1 capsule as needed     lidocaine-prilocaine (EMLA) cream Apply 1 application topically as needed. 30 g 1   NORVASC 2.5 MG tablet Take 1 tablet (2.5 mg total) by mouth daily. 90 tablet 1    ondansetron (ZOFRAN) 8 MG tablet Take 1 tablet (8 mg total) by mouth every 8 (eight) hours as needed for nausea or vomiting. 20 tablet 2   prochlorperazine (COMPAZINE) 10 MG tablet Take 1 tablet (10 mg total) by mouth every 6 (six) hours as needed for nausea or vomiting. 30 tablet 2   Current Facility-Administered Medications  Medication Dose Route Frequency Provider Last Rate Last Admin   0.9 %  sodium chloride infusion  500 mL Intravenous Once Danis, Estill Cotta III, MD        PHYSICAL EXAMINATION: ECOG PERFORMANCE STATUS: 1 - Symptomatic but completely ambulatory  There were no vitals filed for this visit. Wt Readings from Last 3 Encounters:  03/07/22 108 lb (49 kg)  02/21/22 109 lb 12.8 oz (49.8 kg)  02/07/22 108 lb 8 oz (49.2 kg)     No vitals taken today, Exam not performed today  LABORATORY DATA:  I have reviewed the data as listed    Latest Ref Rng & Units 03/07/2022    8:50 AM 02/21/2022    8:43 AM 02/07/2022  9:20 AM  CBC  WBC 4.0 - 10.5 K/uL 6.6  8.9  7.4   Hemoglobin 12.0 - 15.0 g/dL 9.4  9.4  9.4   Hematocrit 36.0 - 46.0 % 30.3  30.2  29.8   Platelets 150 - 400 K/uL 193  240  157         Latest Ref Rng & Units 03/07/2022    8:50 AM 02/21/2022    8:43 AM 02/07/2022    9:20 AM  CMP  Glucose 70 - 99 mg/dL 98  87  91   BUN 8 - 23 mg/dL '11  8  11   ' Creatinine 0.44 - 1.00 mg/dL 0.69  0.53  0.66   Sodium 135 - 145 mmol/L 136  138  138   Potassium 3.5 - 5.1 mmol/L 3.9  4.0  3.8   Chloride 98 - 111 mmol/L 105  106  106   CO2 22 - 32 mmol/L '28  28  28   ' Calcium 8.9 - 10.3 mg/dL 9.0  9.1  8.6   Total Protein 6.5 - 8.1 g/dL 6.6  6.6  6.7   Total Bilirubin 0.3 - 1.2 mg/dL 0.4  0.2  0.3   Alkaline Phos 38 - 126 U/L 90  96  81   AST 15 - 41 U/L '12  12  12   ' ALT 0 - 44 U/L '5  5  5       ' RADIOGRAPHIC STUDIES: I have personally reviewed the radiological images as listed and agreed with the findings in the report. No results found.    Orders Placed This Encounter   Procedures   CBC with Differential ([place] Only)    Standing Status:   Future    Standing Expiration Date:   03/29/2023   CMP ([place] only)    Standing Status:   Future    Standing Expiration Date:   03/29/2023   Total Protein, Urine dipstick    Standing Status:   Future    Standing Expiration Date:   03/29/2023   CBC with Differential ([place] Only)    Standing Status:   Future    Standing Expiration Date:   04/12/2023   CMP ([place] only)    Standing Status:   Future    Standing Expiration Date:   04/12/2023   CBC with Differential ([place] Only)    Standing Status:   Future    Standing Expiration Date:   04/26/2023   CMP ([place] only)    Standing Status:   Future    Standing Expiration Date:   04/26/2023   Total Protein, Urine dipstick    Standing Status:   Future    Standing Expiration Date:   04/26/2023   All questions were answered. The patient knows to call the clinic with any problems, questions or concerns. No barriers to learning was detected. The total time spent in the appointment was 40 minutes.     [name], MD 03/21/2022   I, Wilburn Mylar, am acting as scribe for [name], MD.   I have reviewed the above documentation for accuracy and completeness, and I agree with the above.

## 2022-03-21 NOTE — Progress Notes (Signed)
ON PATHWAY REGIMEN - Colorectal  No Change  Continue With Treatment as Ordered.  Original Decision Date/Time: 07/15/2020 14:11     A cycle is every 14 days:     Bevacizumab-xxxx      Irinotecan      Leucovorin      Fluorouracil      Fluorouracil   **Always confirm dose/schedule in your pharmacy ordering system**  Patient Characteristics: Distant Metastases, Nonsurgical Candidate, KRAS/NRAS Mutation Positive/Unknown (BRAF V600 Wild-Type/Unknown), Standard Cytotoxic Therapy, First Line Standard Cytotoxic Therapy, Bevacizumab Eligible, PS = 0,1 Tumor Location: Rectal Therapeutic Status: Distant Metastases Microsatellite/Mismatch Repair Status: Unknown BRAF Mutation Status: Awaiting Test Results KRAS/NRAS Mutation Status: Awaiting Test Results Standard Cytotoxic Line of Therapy: First Line Standard Cytotoxic Therapy ECOG Performance Status: 1 Bevacizumab Eligibility: Eligible Intent of Therapy: Non-Curative / Palliative Intent, Discussed with Patient

## 2022-03-21 NOTE — Progress Notes (Signed)
DISCONTINUE ON PATHWAY REGIMEN - Colorectal     A cycle is every 14 days:     Bevacizumab-xxxx      Irinotecan      Leucovorin      Fluorouracil      Fluorouracil   **Always confirm dose/schedule in your pharmacy ordering system**  REASON: Disease Progression PRIOR TREATMENT: [id number]: FOLFIRI + Bevacizumab q14 Days TREATMENT RESPONSE: Partial Response (PR)  START ON PATHWAY REGIMEN - Colorectal     A cycle is every 14 days:     Bevacizumab-xxxx      Oxaliplatin      Leucovorin      Fluorouracil      Fluorouracil   **Always confirm dose/schedule in your pharmacy ordering system**  Patient Characteristics: Distant Metastases, Nonsurgical Candidate, KRAS/NRAS Mutation Positive/Unknown (BRAF V600 Wild-Type/Unknown), Standard Cytotoxic Therapy, Second Line Standard Cytotoxic Therapy, Bevacizumab Eligible Tumor Location: Rectal Therapeutic Status: Distant Metastases Microsatellite/Mismatch Repair Status: MSS/pMMR BRAF Mutation Status: Wild-Type (no mutation) KRAS/NRAS Mutation Status: Mutation Positive Preferred Therapy Approach: Standard Cytotoxic Therapy Standard Cytotoxic Line of Therapy: Second Line Standard Cytotoxic Therapy Bevacizumab Eligibility: Eligible Intent of Therapy: Non-Curative / Palliative Intent, Discussed with Patient

## 2022-03-22 ENCOUNTER — Other Ambulatory Visit: Payer: Self-pay

## 2022-03-27 ENCOUNTER — Other Ambulatory Visit: Payer: Self-pay

## 2022-03-27 MED FILL — Dexamethasone Sodium Phosphate Inj 100 MG/10ML: INTRAMUSCULAR | Qty: 1 | Status: AC

## 2022-03-28 ENCOUNTER — Inpatient Hospital Stay: Payer: Federal, State, Local not specified - PPO

## 2022-03-28 ENCOUNTER — Other Ambulatory Visit: Payer: Self-pay

## 2022-03-28 ENCOUNTER — Inpatient Hospital Stay (HOSPITAL_BASED_OUTPATIENT_CLINIC_OR_DEPARTMENT_OTHER): Payer: Federal, State, Local not specified - PPO | Admitting: Hematology

## 2022-03-28 ENCOUNTER — Encounter: Payer: Self-pay | Admitting: Hematology

## 2022-03-28 ENCOUNTER — Inpatient Hospital Stay: Payer: Federal, State, Local not specified - PPO | Admitting: Hematology

## 2022-03-28 VITALS — BP 137/77 | HR 91 | Temp 98.1°F | Resp 18 | Ht 66.0 in | Wt 106.7 lb

## 2022-03-28 DIAGNOSIS — C787 Secondary malignant neoplasm of liver and intrahepatic bile duct: Secondary | ICD-10-CM

## 2022-03-28 DIAGNOSIS — Z5112 Encounter for antineoplastic immunotherapy: Secondary | ICD-10-CM | POA: Diagnosis not present

## 2022-03-28 DIAGNOSIS — C20 Malignant neoplasm of rectum: Secondary | ICD-10-CM

## 2022-03-28 DIAGNOSIS — Z95828 Presence of other vascular implants and grafts: Secondary | ICD-10-CM

## 2022-03-28 LAB — CBC WITH DIFFERENTIAL (CANCER CENTER ONLY)
Abs Immature Granulocytes: 0.02 10*3/uL (ref 0.00–0.07)
Basophils Absolute: 0 10*3/uL (ref 0.0–0.1)
Basophils Relative: 1 %
Eosinophils Absolute: 0.1 10*3/uL (ref 0.0–0.5)
Eosinophils Relative: 1 %
HCT: 28.6 % — ABNORMAL LOW (ref 36.0–46.0)
Hemoglobin: 8.9 g/dL — ABNORMAL LOW (ref 12.0–15.0)
Immature Granulocytes: 0 %
Lymphocytes Relative: 19 %
Lymphs Abs: 0.9 10*3/uL (ref 0.7–4.0)
MCH: 25.6 pg — ABNORMAL LOW (ref 26.0–34.0)
MCHC: 31.1 g/dL (ref 30.0–36.0)
MCV: 82.2 fL (ref 80.0–100.0)
Monocytes Absolute: 0.5 10*3/uL (ref 0.1–1.0)
Monocytes Relative: 9 %
Neutro Abs: 3.5 10*3/uL (ref 1.7–7.7)
Neutrophils Relative %: 70 %
Platelet Count: 294 10*3/uL (ref 150–400)
RBC: 3.48 MIL/uL — ABNORMAL LOW (ref 3.87–5.11)
RDW: 17 % — ABNORMAL HIGH (ref 11.5–15.5)
WBC Count: 5 10*3/uL (ref 4.0–10.5)
nRBC: 0 % (ref 0.0–0.2)

## 2022-03-28 LAB — CMP (CANCER CENTER ONLY)
ALT: 6 U/L (ref 0–44)
AST: 13 U/L — ABNORMAL LOW (ref 15–41)
Albumin: 3.5 g/dL (ref 3.5–5.0)
Alkaline Phosphatase: 78 U/L (ref 38–126)
Anion gap: 8 (ref 5–15)
BUN: 10 mg/dL (ref 8–23)
CO2: 27 mmol/L (ref 22–32)
Calcium: 8.6 mg/dL — ABNORMAL LOW (ref 8.9–10.3)
Chloride: 103 mmol/L (ref 98–111)
Creatinine: 0.53 mg/dL (ref 0.44–1.00)
GFR, Estimated: >60 mL/min (ref >60)
Glucose, Bld: 91 mg/dL (ref 70–99)
Potassium: 3.7 mmol/L (ref 3.5–5.1)
Sodium: 138 mmol/L (ref 135–145)
Total Bilirubin: 0.3 mg/dL (ref 0.3–1.2)
Total Protein: 6.8 g/dL (ref 6.5–8.1)

## 2022-03-28 LAB — TOTAL PROTEIN, URINE DIPSTICK: Protein, ur: NEGATIVE mg/dL

## 2022-03-28 MED ORDER — DEXTROSE 5 % IV SOLN: Freq: Once | INTRAVENOUS | Status: AC

## 2022-03-28 MED ORDER — SODIUM CHLORIDE 0.9 % IV SOLN
10.0000 mg | Freq: Once | INTRAVENOUS | Status: AC
  Administered 2022-03-28: 10 mg via INTRAVENOUS
  Filled 2022-03-28: qty 10

## 2022-03-28 MED ORDER — SODIUM CHLORIDE 0.9% FLUSH
10.0000 mL | Freq: Once | INTRAVENOUS | Status: AC
  Administered 2022-03-28: 10 mL

## 2022-03-28 MED ORDER — SODIUM CHLORIDE 0.9 % IV SOLN
1800.0000 mg/m2 | INTRAVENOUS | Status: DC
  Administered 2022-03-28: 2700 mg via INTRAVENOUS
  Filled 2022-03-28: qty 54

## 2022-03-28 MED ORDER — LEUCOVORIN CALCIUM INJECTION 350 MG
400.0000 mg/m2 | Freq: Once | INTRAVENOUS | Status: AC
  Administered 2022-03-28: 600 mg via INTRAVENOUS
  Filled 2022-03-28: qty 25
  Filled 2022-03-28 (×2): qty 30

## 2022-03-28 MED ORDER — SODIUM CHLORIDE 0.9 % IV SOLN
5.0000 mg/kg | Freq: Once | INTRAVENOUS | Status: AC
  Administered 2022-03-28: 250 mg via INTRAVENOUS
  Filled 2022-03-28: qty 10

## 2022-03-28 MED ORDER — SODIUM CHLORIDE 0.9 % IV SOLN: Freq: Once | INTRAVENOUS | Status: AC

## 2022-03-28 MED ORDER — PALONOSETRON HCL INJECTION 0.25 MG/5ML
0.2500 mg | Freq: Once | INTRAVENOUS | Status: AC
  Administered 2022-03-28: 0.25 mg via INTRAVENOUS
  Filled 2022-03-28: qty 5

## 2022-03-28 MED ORDER — OXALIPLATIN CHEMO INJECTION 100 MG/20ML
60.0000 mg/m2 | Freq: Once | INTRAVENOUS | Status: AC
  Administered 2022-03-28: 90 mg via INTRAVENOUS
  Filled 2022-03-28: qty 18

## 2022-03-28 NOTE — Progress Notes (Signed)
[facility]   [phone] [phone]   Clinic Follow up Note   Patient Care Team: Patient, No Pcp Per as PCP - General (General Practice) [name], NP as Nurse Practitioner (Oncology) [name], MD as Consulting Physician (Oncology) [name], RN (Inactive) as Oncology Nurse Navigator  Date of Service:  03/28/2022  CHIEF COMPLAINT: f/u of metastatic rectal cancer, h/o colon cancer  CURRENT THERAPY:  FOLFOX with bevacizumab, q14d, starting 03/28/22  ASSESSMENT & PLAN:  Brittney Spencer is a 71 y.o. female with   1. Rectal adenocarcinoma, with liver and right pelvic metastasis, recurrence from previous colon cancer vs new primary   -Diagnosed on screening colonoscopy 05/2020, rectal mass biopsy showed invasive adenocarcinoma, arising from a tubular adenoma -Her 07/12/20 PET showed positive uptake of known rectal tumor and soft tissue mass within the right iliac fossa indicating recurrent prior colon cancer. There is also left lobe of liver lesion is FDG avid concerning for liver metastasis. pt declined liver biopsy -She previously declined referral for HIPEC -She began first-line chemo with dose-reduced FOLFIRI on 08/06/20, Bevacizumab added with cycle 2. -She changed to maintenance therapy with irinotecan and bevacizumab every 2 weeks on 01/24/21. -restaging CT CAP 12/10/21 showed: new 1.5 cm right hepatic lobe metastasis . Her CEA had also been slowly rising, up to 11.33 on 12/13/21.  -5FU was added back to her treatment on 12/27/21. She is tolerating well at reduced dose. -restaging CT CAP 03/18/22 showed increase in size of right liver lobe metastasis, otherwise stable. Her CEA has continued to rise despite the addition of 5-FU, most recently 22.9 on 03/07/22.  -she is scheduled to change to FOLFOX with continued Beva today, 03/28/22. We reviewed side effects, including cold sensitivity, from oxaliplatin. -lab reviewed, hgb down slightly to  8.9, otherwise stable. Adequate to proceed with treatment.   2. Cancer of ascending colon, pT4bN1bM0, stage IIIC, MSI-stable, (+) surgical margins at pelvic wall     -diagnosed in 12/2015. S/p right hemicolectomy with right salpingo oophorectomy and adjuvant ChemoRT. -Her 04/2019 CT scan was NED  -With recent rectal cancer diagnosis, her PET from 07/12/20 indicates soft tissue mass within the right iliac fossa is noted and consistent with local tumor recurrence from previous ascending colon tumor.   3. Mild Anemia -Has been stable and mild for the past year.  -worse lately due to rectal cancer and starting chemo -denies bleeding -monitoring, stable in 9-range.   4. HTN, Anxiety -She'll follow-up with her primary care physician and continue medication -She uses half tab Xanax as needed, mostly only on treatment days. -stable     PLAN: -proceed with FOLFOX/beva today at same dose  -lab, flush, f/u, and FOLFOX/beva in 2 and 4 weeks   No problem-specific Assessment & Plan notes found for this encounter.   SUMMARY OF ONCOLOGIC HISTORY: Oncology History Overview Note  Cancer Staging Cancer of ascending colon [facility]) Staging form: Colon and Rectum, AJCC 7th Edition - Clinical stage from 02/07/2016: Stage IIIC (T4b, N1b, M0) - Signed by [name], MD on 03/04/2016 Laterality: Right Residual tumor (R): R2 - Macroscopic    Cancer of ascending colon ([place])  10/25/2015 Imaging   CT ABD/PELVIS:  Inflammatory changes inferior to the cecal tip appear improved, there is still irregular soft tissue thickening of the cecal tip, and there are adjacent prominent lymph nodes in the ileocolonic mesentery, measuring 13 mm on image 49 and 8 mm on image 52. In addition, there is  a 2.5 x 1.8 cm nodule on image 46 which has central low density. Therefore, these findings are moderately suspicious for an underlying cecal malignancy with perforation.    01/19/2016 Procedure   COLONOSCOPY per Dr. [name]:  Fungating, ulcerated mass almost obstructing mid ascending colon   01/19/2016 Initial Biopsy   Diagnosis Surgical [P], cecal mass - INVASIVE ADENOCARCINOMA WITH ULCERATION. - SEE COMMENT.   02/05/2016 Tumor Marker   Patient's tumor was tested for the following markers: CEA Results of the tumor marker test revealed 5.7.   02/07/2016 Initial Diagnosis   Cancer of ascending colon ([place])   02/07/2016 Definitive Surgery   Laparoscopic assisted right hemicolectomy and right salpingo oopherectomy--Dr. [name]   02/07/2016 Pathologic Stage   p T4 N1b   2/43 nodes +   02/07/2016 Pathology Results   MMR normal; G2 adenocarcinoma;proximal & distal margins negative; soft tissue mass on pelvic sidewall + for adenocarcinoma with positive margin MSI Stable   03/08/2016 Imaging   CT chest negative for metastasis.    03/19/2016 - 04/25/2016 Radiation Therapy   Adjuvant irradiation, 50 gray in 28 fractions   03/19/2016 - 04/22/2016 Chemotherapy   Xeloda 1500 mg twice daily, started on 03/19/2016, dose reduced to 1000 mg twice daily from week 3 due to neutropenia, and patient stopped 3 days before last dose radiation due to difficulty swallowing the pill    05/20/2016 -  Adjuvant Chemotherapy   Patient declined adjuvant chemotherapy   09/16/2016 Imaging   CT CAP w Contrast 1. No evidence of local tumor recurrence at the ileocolic anastomosis. 2. No findings suspicious for metastatic disease in the chest, abdomen or pelvis. 3. Nonspecific trace free fluid in the pelvic cul-de-sac. 4. Stable solitary 3 mm right upper lobe pulmonary nodule, for which 6 month stability has been demonstrated, probably benign. 5. Additional findings include stable right posterior pericardial cyst and small calcified uterine fibroids.   05/13/2017 Imaging   CT CAP W Contrast 05/13/17 IMPRESSION: 1. No current findings of residual or recurrent malignancy. 2. Mild prominence of stool throughout the colon.  Nondistended portions of the rectum. 3. Several tiny pulmonary nodules are stable from the earliest available comparison of 03/08/2016 and probably benign, but may merit surveillance. 4. Other imaging findings of potential clinical significance: Old granulomatous disease. Aortoiliac atherosclerotic vascular disease. Lumbar spondylosis and degenerative disc disease. Stable amount of trace free pelvic fluid.   04/27/2018 Imaging   04/27/2018 CT CAP IMPRESSION: Stable exam. No evidence of recurrent or metastatic carcinoma within the chest, abdomen, or pelvis   04/19/2019 Imaging   CT CAP W Contrast  IMPRESSION: Chest Impression:   1. No evidence of thoracic metastasis. 2. Stable small bilateral pulmonary nodules.   Abdomen / Pelvis Impression:   1. No evidence local colorectal carcinoma recurrence or metastasis in the abdomen pelvis. 2. Post RIGHT hemicolectomy.   01/22/2021 Imaging   CT CAP  IMPRESSION: CT CHEST IMPRESSION   1. Similar nonspecific pulmonary nodules. 2. New posterior left upper lobe reticulonodular opacity, suspicious for interval mild infection or inflammation. 3. No thoracic adenopathy.   CT ABDOMEN AND PELVIS IMPRESSION   1. Further decrease in size of high left hepatic lobe 3 mm low-density lesion. No new or progressive metastatic disease within the abdomen or pelvis. 2. Similar trace free pelvic fluid. 3. Similar nonspecific mid rectal wall thickening. 4.  Aortic Atherosclerosis (ICD10-I70.0).   04/23/2021 Imaging   CT CAP  IMPRESSION: 1. Treated metastatic lesion between segments 2 and 3 of the  liver, slightly smaller and less distinct than prior examination. No other signs of definite metastatic disease elsewhere in the abdomen or pelvis. 2. Multiple small pulmonary nodules, stable compared to the prior examination, favored to be benign. No definitive findings to suggest metastatic disease to the thorax. 3. Aortic atherosclerosis. 4.  Additional incidental findings, as above.   08/13/2021 Imaging   EXAM: CT CHEST, ABDOMEN, AND PELVIS WITH CONTRAST  IMPRESSION: 1. A previously seen PET avid lesion of the anterior left lobe of the liver, hepatic segment II, is no longer discretely appreciable consistent with treatment response of a hepatic metastasis. 2. No evidence of new metastatic disease in the chest, abdomen, or pelvis. 3. Interval increase in a small focus of consolidation and nodularity of the medial left upper lobe, consistent with minimal, ongoing atypical infection. Additional tiny bilateral pulmonary nodules are stable and almost certainly incidental benign. Attention on follow-up. 4. Status post right hemicolectomy and ileocolic anastomosis.   Rectal cancer metastasized to liver ([place])  06/15/2020 Procedure   Screening Colonoscopy by Dr [name]  IMPRESSION - Decreased sphincter tone and internal hemorrhoids that prolapse with straining, but require manual replacement into the anal canal (Grade III) found on digital rectal exam. - Patent side-to-side ileo-colonic anastomosis, characterized by healthy appearing mucosa. - The examined portion of the ileum was normal. - One diminutive polyp in the proximal transverse colon, removed with a cold biopsy forceps. Resected and retrieved. - Likely malignant partially obstructing tumor in the mid rectum. Biopsied. Tattooed. - The examination was otherwise normal on direct and retroflexion views.   06/15/2020 Initial Biopsy   Diagnosis 1. Transverse Colon Polyp - HYPERPLASTIC POLYP 2. Rectum, biopsy - ADENOCARCINOMA ARISING IN A TUBULAR ADENOMA WITH HIGH-GRADE DYSPLASIA. SEE NOTE Diagnosis Note 2. Dr. [name] reviewed the case and concurs with the diagnosis. Dr. [name] was notified on 06/16/2020.   06/28/2020 Imaging   CT CAP  IMPRESSION: 1. New low-density focus in the anterior aspect of the lateral segment LEFT hepatic lobe measuring 1.2 x 1.0 cm, compatible  with small metastatic lesion in the LEFT hepatic lobe. 2. Soft tissue in the RIGHT iliac fossa following RIGHT hemicolectomy invades the psoas musculature and is slowly enlarging over time, more linear on the prior study now highly concerning for recurrence/metastasis to this location. 3. Signs of enteritis, potentially post radiation changes of the small bowel. Tethered small bowel in the RIGHT lower quadrant shows focal thickening and narrowing suspicious for small bowel involvement and developing partial obstruction though currently contrast passes beyond this point into the colon. 4. Rectal thickening in this patient with known rectal mass as described. 5. No evidence of metastatic disease in the chest. 6. Stable small pulmonary nodules. 7.  and aortic atherosclerosis.   Aortic Atherosclerosis (ICD10-I70.0) and Emphysema (ICD10-J43.9).   07/04/2020 Initial Diagnosis   Rectal cancer metastasized to liver ([place])   07/12/2020 PET scan   IMPRESSION: 1. Exam positive for FDG avid rectal tumor which corresponds to the recent colonoscopy findings. 2. FDG avid soft tissue mass within the right iliac fossa is noted and consistent with local tumor recurrence from previous ascending colon tumor. 3. Lateral segment left lobe of liver lesion is FDG avid concerning for liver metastasis. 4. No specific findings identified to suggest metastatic disease to the chest.   08/10/2020 -  Chemotherapy   First-line FOLFIRI q2weeks starting 08/10/20. dose reduced with cycle 1. Irinotecan/5FU increased and Bevacizumab added with cycle 2 on 08/23/2020    08/17/2020 - 03/09/2022 Chemotherapy  Patient is on Treatment Plan : COLORECTAL FOLFIRI + Bevacizumab q14d     10/27/2020 Imaging   CT CAP  IMPRESSION: 1. Interval decrease in size of the hypermetabolic left hepatic lesion, consistent with metastatic disease. No new liver lesion evident. 2. Interval resolution of the hypermetabolic soft tissue  lesion along the right iliac fossa with no measurable soft tissue lesion remaining at this location today. 3. Similar appearance of soft tissue fullness in the rectum at the site of the hypermetabolic lesion seen previously. 4. Stable tiny bilateral pulmonary nodules. Continued attention on follow-up recommended. 5. Small volume free fluid in the pelvis. 6. Aortic Atherosclerosis (ICD10-I70.0).   01/22/2021 Imaging   CT CAP  IMPRESSION: CT CHEST IMPRESSION   1. Similar nonspecific pulmonary nodules. 2. New posterior left upper lobe reticulonodular opacity, suspicious for interval mild infection or inflammation. 3. No thoracic adenopathy.   CT ABDOMEN AND PELVIS IMPRESSION   1. Further decrease in size of high left hepatic lobe 3 mm low-density lesion. No new or progressive metastatic disease within the abdomen or pelvis. 2. Similar trace free pelvic fluid. 3. Similar nonspecific mid rectal wall thickening. 4.  Aortic Atherosclerosis (ICD10-I70.0).   04/23/2021 Imaging   CT CAP  IMPRESSION: 1. Treated metastatic lesion between segments 2 and 3 of the liver, slightly smaller and less distinct than prior examination. No other signs of definite metastatic disease elsewhere in the abdomen or pelvis. 2. Multiple small pulmonary nodules, stable compared to the prior examination, favored to be benign. No definitive findings to suggest metastatic disease to the thorax. 3. Aortic atherosclerosis. 4. Additional incidental findings, as above.   08/13/2021 Imaging   EXAM: CT CHEST, ABDOMEN, AND PELVIS WITH CONTRAST  IMPRESSION: 1. A previously seen PET avid lesion of the anterior left lobe of the liver, hepatic segment II, is no longer discretely appreciable consistent with treatment response of a hepatic metastasis. 2. No evidence of new metastatic disease in the chest, abdomen, or pelvis. 3. Interval increase in a small focus of consolidation and nodularity of the medial left  upper lobe, consistent with minimal, ongoing atypical infection. Additional tiny bilateral pulmonary nodules are stable and almost certainly incidental benign. Attention on follow-up. 4. Status post right hemicolectomy and ileocolic anastomosis.   03/28/2022 -  Chemotherapy   Patient is on Treatment Plan : COLORECTAL FOLFOX + Bevacizumab q14d        INTERVAL HISTORY:  Brittney Spencer is here for a follow up of metastatic rectal cancer. She was last seen by me on 03/21/22. She presents to the clinic alone. She reports she is doing well overall. She notes she developed some mild diarrhea last evening, which she attributes to something she ate.   All other systems were reviewed with the patient and are negative.  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 Some residual  intraretinal fluid remains, very small localized subfoveal of fluid remains although this continues to slowly resorb. We'll continue to observe.   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  ALLERGIES:  is allergic to fish allergy, peanut-containing drug products, soy allergy, and buspirone.  MEDICATIONS:  Current Outpatient Medications  Medication Sig Dispense Refill   ALPRAZolam (XANAX) 0.25 MG tablet Take 1 tablet (0.25 mg total) by mouth daily as needed for anxiety. 30 tablet 0   Cholecalciferol (VITAMIN D3 PO) Take by mouth daily.     docusate sodium (COLACE) 100 MG capsule 1 capsule as needed     lidocaine-prilocaine (EMLA) cream Apply 1 application topically as needed. 30 g 1   NORVASC 2.5 MG tablet Take 1 tablet (2.5 mg total) by mouth daily. 90 tablet 1   ondansetron (ZOFRAN) 8 MG tablet Take 1 tablet (8 mg total) by mouth every 8 (eight) hours as needed for nausea or vomiting. 20 tablet 2   prochlorperazine (COMPAZINE) 10 MG tablet Take 1 tablet (10 mg total) by mouth every 6 (six) hours as needed for nausea or vomiting. 30 tablet 2   Current Facility-Administered Medications  Medication Dose Route Frequency Provider Last Rate Last Admin   0.9 %  sodium chloride infusion  500 mL Intravenous Once Danis, Estill Cotta III, MD        PHYSICAL EXAMINATION: ECOG PERFORMANCE STATUS: 1 - Symptomatic but completely ambulatory  Vitals:   03/28/22 1017  BP: 137/77  Pulse: 91  Resp: 18  Temp: 98.1 F (36.7 C)  SpO2: 100%   Wt Readings from Last 3 Encounters:  03/28/22 106 lb 11.2 oz (48.4 kg)  03/07/22 108 lb (49 kg)  02/21/22 109 lb 12.8 oz (49.8 kg)     GENERAL:alert, no distress and comfortable SKIN: skin color normal, no rashes or significant lesions EYES: normal, Conjunctiva are pink and non-injected, sclera clear  NEURO: alert & oriented x 3 with fluent speech  LABORATORY DATA:  I have reviewed the data as  listed    Latest Ref Rng & Units 03/28/2022    9:39 AM 03/07/2022    8:50 AM 02/21/2022    8:43 AM  CBC  WBC 4.0 - 10.5 K/uL 5.0  6.6  8.9   Hemoglobin 12.0 - 15.0 g/dL 8.9  9.4  9.4   Hematocrit 36.0 - 46.0 % 28.6  30.3  30.2   Platelets 150 - 400 K/uL 294  193  240         Latest Ref Rng & Units 03/28/2022    9:39 AM 03/07/2022    8:50 AM 02/21/2022    8:43 AM  CMP  Glucose 70 - 99 mg/dL 91  98  87   BUN 8 - 23 mg/dL _0 Creatinine 0.44 - 1.00 mg/dL 0.53  0.69  0.53  Sodium 135 - 145 mmol/L 138  136  138   Potassium 3.5 - 5.1 mmol/L 3.7  3.9  4.0   Chloride 98 - 111 mmol/L 103  105  106   CO2 22 - 32 mmol/L _0 Calcium 8.9 - 10.3 mg/dL 8.6  9.0  9.1   Total Protein 6.5 - 8.1 g/dL 6.8  6.6  6.6   Total Bilirubin 0.3 - 1.2 mg/dL 0.3  0.4  0.2   Alkaline Phos 38 - 126 U/L 78  90  96   AST 15 - 41 U/L _1 ALT 0 - 44 U/L _2 RADIOGRAPHIC STUDIES: I have personally reviewed the radiological images as listed and agreed with the findings in the report. No results found.    Orders Placed This Encounter  Procedures   CBC with Differential ([place] Only)    Standing Status:   Future    Standing Expiration Date:   05/10/2023   CMP ([place] only)    Standing Status:   Future    Standing Expiration Date:   05/10/2023   All questions were answered. The patient knows to call the clinic with any problems, questions or concerns. No barriers to learning was detected. The total time spent in the appointment was 30 minutes.     [name], MD 03/28/2022   I, Wilburn Mylar, am acting as scribe for [name], MD.   I have reviewed the above documentation for accuracy and completeness, and I agree with the above.

## 2022-03-28 NOTE — Patient Instructions (Signed)
[facility] MEDICAL ONCOLOGY  Discharge Instructions: Thank you for choosing [facility] to provide your oncology and hematology care.   If you have a lab appointment with the Cancer Center, please go directly to the Cancer Center and check in at the registration area.   Wear comfortable clothing and clothing appropriate for easy access to any Portacath or PICC line.   We strive to give you quality time with your provider. You may need to reschedule your appointment if you arrive late (15 or more minutes).  Arriving late affects you and other patients whose appointments are after yours.  Also, if you miss three or more appointments without notifying the office, you may be dismissed from the clinic at the provider's discretion.      For prescription refill requests, have your pharmacy contact our office and allow 72 hours for refills to be completed.    Today you received the following chemotherapy and/or immunotherapy agents: Bevacizumab, Lecovorin, Irinotecan, and Fluorouracil   To help prevent nausea and vomiting after your treatment, we encourage you to take your nausea medication as directed.  BELOW ARE SYMPTOMS THAT SHOULD BE REPORTED IMMEDIATELY: *FEVER GREATER THAN 100.4 F (38 C) OR HIGHER *CHILLS OR SWEATING *NAUSEA AND VOMITING THAT IS NOT CONTROLLED WITH YOUR NAUSEA MEDICATION *UNUSUAL SHORTNESS OF BREATH *UNUSUAL BRUISING OR BLEEDING *URINARY PROBLEMS (pain or burning when urinating, or frequent urination) *BOWEL PROBLEMS (unusual diarrhea, constipation, pain near the anus) TENDERNESS IN MOUTH AND THROAT WITH OR WITHOUT PRESENCE OF ULCERS (sore throat, sores in mouth, or a toothache) UNUSUAL RASH, SWELLING OR PAIN  UNUSUAL VAGINAL DISCHARGE OR ITCHING   Items with * indicate a potential emergency and should be followed up as soon as possible or go to the Emergency Department if any problems should occur.  Please show the CHEMOTHERAPY ALERT CARD or  IMMUNOTHERAPY ALERT CARD at check-in to the Emergency Department and triage nurse.  Should you have questions after your visit or need to cancel or reschedule your appointment, please contact [facility]ICAL [facility]COL[facility]  Dept: [phone]  and follow the prompts.  Office hours are 8:00 a.m. to 4:30 p.m. Monday - Friday. Please note that voicemails left after 4:00 p.m. may not be returned until the following business day.  We are closed weekends and major holidays. You have access to a nurse at all times for urgent questions. Please call the main number to the clinic Dept: [phone] and follow the prompts.   For any non-urgent questions, you may also contact your provider using MyChart. We now offer e-Visits for anyone 18 and older to request care online for non-urgent symptoms. For details visit [email or web address].   Also download the MyChart app! Go to the app store, search "MyChart", open the app, select [facility], and log in with your MyChart username and password.  Masks are optional in the cancer centers. If you would like for your care team to wear a mask while they are taking care of you, please let them know. You may have one support Ann-Marie Kluge who is at least 73 years old accompany you for your appointments. The chemotherapy medication bag should finish at 46 hours, 96 hours, or 7 days. For example, if your pump is scheduled for 46 hours and it was put on at 4:00 p.m., it should finish at 2:00 p.m. the day it is scheduled to come off regardless of your appointment time.     Estimated time to finish at:     If the display on your pump reads "Low Volume" and it is beeping, take the batteries out of the pump and come to the cancer center for it to be taken off.   If the pump alarms go off prior to the pump reading "Low Volume" then call [phone] and someone can assist you.  If the plunger comes out and the chemotherapy medication is leaking out, please use your home  chemo spill kit to clean up the spill. Do NOT use paper towels or other household products.  If you have problems or questions regarding your pump, please call either [phone] (24 hours a day) or the cancer center Monday-Friday 8:00 a.m.- 4:30 p.m. at the clinic number and we will assist you. If you are unable to get assistance, then go to the nearest Emergency Department and ask the staff to contact the IV team for assistance.   

## 2022-03-30 ENCOUNTER — Inpatient Hospital Stay: Payer: Federal, State, Local not specified - PPO

## 2022-03-30 VITALS — BP 148/81 | HR 95 | Temp 97.5°F | Resp 16 | Ht 66.0 in

## 2022-03-30 DIAGNOSIS — Z5112 Encounter for antineoplastic immunotherapy: Secondary | ICD-10-CM | POA: Diagnosis not present

## 2022-03-30 DIAGNOSIS — C787 Secondary malignant neoplasm of liver and intrahepatic bile duct: Secondary | ICD-10-CM

## 2022-03-30 MED ORDER — SODIUM CHLORIDE 0.9% FLUSH
10.0000 mL | INTRAVENOUS | Status: DC | PRN
  Administered 2022-03-30: 10 mL

## 2022-03-30 MED ORDER — PEGFILGRASTIM-JMDB 6 MG/0.6ML ~~LOC~~ SOSY
6.0000 mg | PREFILLED_SYRINGE | Freq: Once | SUBCUTANEOUS | Status: AC
  Administered 2022-03-30: 6 mg via SUBCUTANEOUS

## 2022-03-30 MED ORDER — HEPARIN SOD (PORK) LOCK FLUSH 100 UNIT/ML IV SOLN
500.0000 [IU] | Freq: Once | INTRAVENOUS | Status: AC | PRN
  Administered 2022-03-30: 500 [IU]

## 2022-04-10 ENCOUNTER — Other Ambulatory Visit: Payer: Self-pay

## 2022-04-10 MED FILL — Dexamethasone Sodium Phosphate Inj 100 MG/10ML: INTRAMUSCULAR | Qty: 1 | Status: AC

## 2022-04-11 ENCOUNTER — Encounter: Payer: Self-pay | Admitting: Nurse Practitioner

## 2022-04-11 ENCOUNTER — Inpatient Hospital Stay (HOSPITAL_BASED_OUTPATIENT_CLINIC_OR_DEPARTMENT_OTHER): Payer: Federal, State, Local not specified - PPO | Admitting: Nurse Practitioner

## 2022-04-11 ENCOUNTER — Inpatient Hospital Stay: Payer: Federal, State, Local not specified - PPO | Attending: Nurse Practitioner

## 2022-04-11 ENCOUNTER — Inpatient Hospital Stay: Payer: Federal, State, Local not specified - PPO

## 2022-04-11 ENCOUNTER — Other Ambulatory Visit: Payer: Self-pay

## 2022-04-11 DIAGNOSIS — C182 Malignant neoplasm of ascending colon: Secondary | ICD-10-CM | POA: Diagnosis present

## 2022-04-11 DIAGNOSIS — C787 Secondary malignant neoplasm of liver and intrahepatic bile duct: Secondary | ICD-10-CM

## 2022-04-11 DIAGNOSIS — Z923 Personal history of irradiation: Secondary | ICD-10-CM | POA: Diagnosis not present

## 2022-04-11 DIAGNOSIS — D6481 Anemia due to antineoplastic chemotherapy: Secondary | ICD-10-CM | POA: Insufficient documentation

## 2022-04-11 DIAGNOSIS — Z5111 Encounter for antineoplastic chemotherapy: Secondary | ICD-10-CM | POA: Insufficient documentation

## 2022-04-11 DIAGNOSIS — Z5189 Encounter for other specified aftercare: Secondary | ICD-10-CM | POA: Diagnosis not present

## 2022-04-11 DIAGNOSIS — C20 Malignant neoplasm of rectum: Secondary | ICD-10-CM

## 2022-04-11 DIAGNOSIS — I10 Essential (primary) hypertension: Secondary | ICD-10-CM | POA: Diagnosis not present

## 2022-04-11 DIAGNOSIS — Z1509 Genetic susceptibility to other malignant neoplasm: Secondary | ICD-10-CM | POA: Diagnosis not present

## 2022-04-11 DIAGNOSIS — Z79899 Other long term (current) drug therapy: Secondary | ICD-10-CM | POA: Insufficient documentation

## 2022-04-11 DIAGNOSIS — F419 Anxiety disorder, unspecified: Secondary | ICD-10-CM | POA: Diagnosis not present

## 2022-04-11 DIAGNOSIS — Z5112 Encounter for antineoplastic immunotherapy: Secondary | ICD-10-CM | POA: Diagnosis present

## 2022-04-11 DIAGNOSIS — Z9221 Personal history of antineoplastic chemotherapy: Secondary | ICD-10-CM | POA: Insufficient documentation

## 2022-04-11 DIAGNOSIS — Z95828 Presence of other vascular implants and grafts: Secondary | ICD-10-CM

## 2022-04-11 LAB — CBC WITH DIFFERENTIAL/PLATELET
Abs Immature Granulocytes: 0.03 10*3/uL (ref 0.00–0.07)
Basophils Absolute: 0 10*3/uL (ref 0.0–0.1)
Basophils Relative: 0 %
Eosinophils Absolute: 0.1 10*3/uL (ref 0.0–0.5)
Eosinophils Relative: 1 %
HCT: 30.6 % — ABNORMAL LOW (ref 36.0–46.0)
Hemoglobin: 9.3 g/dL — ABNORMAL LOW (ref 12.0–15.0)
Immature Granulocytes: 0 %
Lymphocytes Relative: 13 %
Lymphs Abs: 1 10*3/uL (ref 0.7–4.0)
MCH: 24.9 pg — ABNORMAL LOW (ref 26.0–34.0)
MCHC: 30.4 g/dL (ref 30.0–36.0)
MCV: 82 fL (ref 80.0–100.0)
Monocytes Absolute: 0.8 10*3/uL (ref 0.1–1.0)
Monocytes Relative: 10 %
Neutro Abs: 5.6 10*3/uL (ref 1.7–7.7)
Neutrophils Relative %: 76 %
Platelets: 217 10*3/uL (ref 150–400)
RBC: 3.73 MIL/uL — ABNORMAL LOW (ref 3.87–5.11)
RDW: 17.5 % — ABNORMAL HIGH (ref 11.5–15.5)
WBC: 7.5 10*3/uL (ref 4.0–10.5)
nRBC: 0 % (ref 0.0–0.2)

## 2022-04-11 LAB — COMPREHENSIVE METABOLIC PANEL
ALT: 6 U/L (ref 0–44)
AST: 15 U/L (ref 15–41)
Albumin: 3.6 g/dL (ref 3.5–5.0)
Alkaline Phosphatase: 90 U/L (ref 38–126)
Anion gap: 4 — ABNORMAL LOW (ref 5–15)
BUN: 10 mg/dL (ref 8–23)
CO2: 28 mmol/L (ref 22–32)
Calcium: 8.8 mg/dL — ABNORMAL LOW (ref 8.9–10.3)
Chloride: 105 mmol/L (ref 98–111)
Creatinine, Ser: 0.62 mg/dL (ref 0.44–1.00)
GFR, Estimated: >60 mL/min (ref >60)
Glucose, Bld: 86 mg/dL (ref 70–99)
Potassium: 4.1 mmol/L (ref 3.5–5.1)
Sodium: 137 mmol/L (ref 135–145)
Total Bilirubin: 0.3 mg/dL (ref 0.3–1.2)
Total Protein: 6.8 g/dL (ref 6.5–8.1)

## 2022-04-11 LAB — TOTAL PROTEIN, URINE DIPSTICK: Protein, ur: NEGATIVE mg/dL

## 2022-04-11 LAB — CEA (IN HOUSE-CHCC): CEA (CHCC-In House): 27.8 ng/mL — ABNORMAL HIGH (ref 0.00–5.00)

## 2022-04-11 MED ORDER — DEXTROSE 5 % IV SOLN: Freq: Once | INTRAVENOUS | Status: AC

## 2022-04-11 MED ORDER — SODIUM CHLORIDE 0.9 % IV SOLN
1800.0000 mg/m2 | INTRAVENOUS | Status: DC
  Administered 2022-04-11: 2700 mg via INTRAVENOUS
  Filled 2022-04-11: qty 54

## 2022-04-11 MED ORDER — SODIUM CHLORIDE 0.9 % IV SOLN
5.0000 mg/kg | Freq: Once | INTRAVENOUS | Status: AC
  Administered 2022-04-11: 250 mg via INTRAVENOUS
  Filled 2022-04-11: qty 10

## 2022-04-11 MED ORDER — SODIUM CHLORIDE 0.9% FLUSH: 10.0000 mL | INTRAVENOUS | Status: DC | PRN

## 2022-04-11 MED ORDER — LEUCOVORIN CALCIUM INJECTION 350 MG
400.0000 mg/m2 | Freq: Once | INTRAVENOUS | Status: AC
  Administered 2022-04-11: 600 mg via INTRAVENOUS
  Filled 2022-04-11: qty 30

## 2022-04-11 MED ORDER — SODIUM CHLORIDE 0.9 % IV SOLN
10.0000 mg | Freq: Once | INTRAVENOUS | Status: AC
  Administered 2022-04-11: 10 mg via INTRAVENOUS
  Filled 2022-04-11: qty 10

## 2022-04-11 MED ORDER — PALONOSETRON HCL INJECTION 0.25 MG/5ML
0.2500 mg | Freq: Once | INTRAVENOUS | Status: AC
  Administered 2022-04-11: 0.25 mg via INTRAVENOUS
  Filled 2022-04-11: qty 5

## 2022-04-11 MED ORDER — HEPARIN SOD (PORK) LOCK FLUSH 100 UNIT/ML IV SOLN: 500.0000 [IU] | Freq: Once | INTRAVENOUS | Status: DC | PRN

## 2022-04-11 MED ORDER — SODIUM CHLORIDE 0.9 % IV SOLN: Freq: Once | INTRAVENOUS | Status: AC

## 2022-04-11 MED ORDER — SODIUM CHLORIDE 0.9% FLUSH
10.0000 mL | Freq: Once | INTRAVENOUS | Status: AC
  Administered 2022-04-11: 10 mL

## 2022-04-11 MED ORDER — OXALIPLATIN CHEMO INJECTION 100 MG/20ML
70.0000 mg/m2 | Freq: Once | INTRAVENOUS | Status: AC
  Administered 2022-04-11: 105 mg via INTRAVENOUS
  Filled 2022-04-11: qty 20

## 2022-04-11 NOTE — Patient Instructions (Signed)
[facility]  Discharge Instructions: Thank you for choosing [facility] to provide your oncology and hematology care.   If you have a lab appointment with the [facility], please go directly to the [facility] and check in at the registration area.   Wear comfortable clothing and clothing appropriate for easy access to any Portacath or PICC line.   We strive to give you quality time with your provider. You may need to reschedule your appointment if you arrive late (15 or more minutes).  Arriving late affects you and other patients whose appointments are after yours.  Also, if you miss three or more appointments without notifying the office, you may be dismissed from the clinic at the provider's discretion.      For prescription refill requests, have your pharmacy contact our office and allow 72 hours for refills to be completed.    Today you received the following chemotherapy and/or immunotherapy agents: Bevacizumab, Lecovorin, Irinotecan, and Fluorouracil   To help prevent nausea and vomiting after your treatment, we encourage you to take your nausea medication as directed.  BELOW ARE SYMPTOMS THAT SHOULD BE REPORTED IMMEDIATELY: *FEVER GREATER THAN 100.4 F (38 C) OR HIGHER *CHILLS OR SWEATING *NAUSEA AND VOMITING THAT IS NOT CONTROLLED WITH YOUR NAUSEA MEDICATION *UNUSUAL SHORTNESS OF BREATH *UNUSUAL BRUISING OR BLEEDING *URINARY PROBLEMS (pain or burning when urinating, or frequent urination) *BOWEL PROBLEMS (unusual diarrhea, constipation, pain near the anus) TENDERNESS IN MOUTH AND THROAT WITH OR WITHOUT PRESENCE OF ULCERS (sore throat, sores in mouth, or a toothache) UNUSUAL RASH, SWELLING OR PAIN  UNUSUAL VAGINAL DISCHARGE OR ITCHING   Items with * indicate a potential emergency and should be followed up as soon as possible or go to the Emergency Department if any problems should occur.  Please show the CHEMOTHERAPY ALERT CARD or  IMMUNOTHERAPY ALERT CARD at check-in to the Emergency Department and triage nurse.  Should you have questions after your visit or need to cancel or reschedule your appointment, please contact [facility]  Dept: [phone]  and follow the prompts.  Office hours are 8:00 a.m. to 4:30 p.m. Monday - Friday. Please note that voicemails left after 4:00 p.m. may not be returned until the following business day.  We are closed weekends and major holidays. You have access to a nurse at all times for urgent questions. Please call the main number to the clinic Dept: [phone] and follow the prompts.   For any non-urgent questions, you may also contact your provider using MyChart. We now offer e-Visits for anyone 73 and older to request care online for non-urgent symptoms. For details visit [email or web address].   Also download the MyChart app! Go to the app store, search "MyChart", open the app, select [facility], and log in with your MyChart username and password.  Masks are optional in the cancer centers. If you would like for your care team to wear a mask while they are taking care of you, please let them know. You may have one support person who is at least 73 years old accompany you for your appointments. The chemotherapy medication bag should finish at 73 hours, 96 hours, or 7 days. For example, if your pump is scheduled for 46 hours and it was put on at 4:00 p.m., it should finish at 2:00 p.m. the day it is scheduled to come off regardless of your appointment time.     Estimated time to finish at:  If the display on your pump reads "Low Volume" and it is beeping, take the batteries out of the pump and come to the cancer center for it to be taken off.   If the pump alarms go off prior to the pump reading "Low Volume" then call [phone] and someone can assist you.  If the plunger comes out and the chemotherapy medication is leaking out, please use your home  chemo spill kit to clean up the spill. Do NOT use paper towels or other household products.  If you have problems or questions regarding your pump, please call either [phone] (24 hours a day) or the cancer center Monday-Friday 8:00 a.m.- 4:30 p.m. at the clinic number and we will assist you. If you are unable to get assistance, then go to the nearest Emergency Department and ask the staff to contact the IV team for assistance.

## 2022-04-11 NOTE — Progress Notes (Signed)
[place]   [phone] [phone]   Clinic Follow up Note   Patient Care Team: Patient, No Pcp Per as PCP - General (General Practice) [name], NP as Nurse Practitioner (Oncology) [name], MD as Consulting Physician (Oncology) [name], RN (Inactive) as Oncology Nurse Navigator 04/11/2022  CHIEF COMPLAINT: Follow-up metastatic rectal cancer and history of colon cancer  SUMMARY OF ONCOLOGIC HISTORY: Oncology History Overview Note  Cancer Staging Cancer of ascending colon [facility]) Staging form: Colon and Rectum, AJCC 7th Edition - Clinical stage from 02/07/2016: Stage IIIC (T4b, N1b, M0) - Signed by [name], MD on 03/04/2016 Laterality: Right Residual tumor (R): R2 - Macroscopic    Cancer of ascending colon ([place])  10/25/2015 Imaging   CT ABD/PELVIS:  Inflammatory changes inferior to the cecal tip appear improved, there is still irregular soft tissue thickening of the cecal tip, and there are adjacent prominent lymph nodes in the ileocolonic mesentery, measuring 13 mm on image 49 and 8 mm on image 52. In addition, there is a 2.5 x 1.8 cm nodule on image 46 which has central low density. Therefore, these findings are moderately suspicious for an underlying cecal malignancy with perforation.    01/19/2016 Procedure   COLONOSCOPY per Dr. [name]: Fungating, ulcerated mass almost obstructing mid ascending colon   01/19/2016 Initial Biopsy   Diagnosis Surgical [P], cecal mass - INVASIVE ADENOCARCINOMA WITH ULCERATION. - SEE COMMENT.   02/05/2016 Tumor Marker   Patient's tumor was tested for the following markers: CEA Results of the tumor marker test revealed 5.7.   02/07/2016 Initial Diagnosis   Cancer of ascending colon ([place])   02/07/2016 Definitive Surgery   Laparoscopic assisted right hemicolectomy and right salpingo oopherectomy--Dr. [name]   02/07/2016 Pathologic Stage   p T4 N1b   2/43 nodes +   02/07/2016 Pathology  Results   MMR normal; G2 adenocarcinoma;proximal & distal margins negative; soft tissue mass on pelvic sidewall + for adenocarcinoma with positive margin MSI Stable   03/08/2016 Imaging   CT chest negative for metastasis.    03/19/2016 - 04/25/2016 Radiation Therapy   Adjuvant irradiation, 50 gray in 28 fractions   03/19/2016 - 04/22/2016 Chemotherapy   Xeloda 1500 mg twice daily, started on 03/19/2016, dose reduced to 1000 mg twice daily from week 3 due to neutropenia, and patient stopped 3 days before last dose radiation due to difficulty swallowing the pill    05/20/2016 -  Adjuvant Chemotherapy   Patient declined adjuvant chemotherapy   09/16/2016 Imaging   CT CAP w Contrast 1. No evidence of local tumor recurrence at the ileocolic anastomosis. 2. No findings suspicious for metastatic disease in the chest, abdomen or pelvis. 3. Nonspecific trace free fluid in the pelvic cul-de-sac. 4. Stable solitary 3 mm right upper lobe pulmonary nodule, for which 6 month stability has been demonstrated, probably benign. 5. Additional findings include stable right posterior pericardial cyst and small calcified uterine fibroids.   05/13/2017 Imaging   CT CAP W Contrast 05/13/17 IMPRESSION: 1. No current findings of residual or recurrent malignancy. 2. Mild prominence of stool throughout the colon. Nondistended portions of the rectum. 3. Several tiny pulmonary nodules are stable from the earliest available comparison of 03/08/2016 and probably benign, but may merit surveillance. 4. Other imaging findings of potential clinical significance: Old granulomatous disease. Aortoiliac atherosclerotic vascular disease. Lumbar spondylosis and degenerative disc disease. Stable amount of trace free pelvic fluid.   04/27/2018 Imaging   04/27/2018 CT CAP  IMPRESSION: Stable exam. No evidence of recurrent or metastatic carcinoma within the chest, abdomen, or pelvis   04/19/2019 Imaging   CT CAP W  Contrast  IMPRESSION: Chest Impression:   1. No evidence of thoracic metastasis. 2. Stable small bilateral pulmonary nodules.   Abdomen / Pelvis Impression:   1. No evidence local colorectal carcinoma recurrence or metastasis in the abdomen pelvis. 2. Post RIGHT hemicolectomy.   01/22/2021 Imaging   CT CAP  IMPRESSION: CT CHEST IMPRESSION   1. Similar nonspecific pulmonary nodules. 2. New posterior left upper lobe reticulonodular opacity, suspicious for interval mild infection or inflammation. 3. No thoracic adenopathy.   CT ABDOMEN AND PELVIS IMPRESSION   1. Further decrease in size of high left hepatic lobe 3 mm low-density lesion. No new or progressive metastatic disease within the abdomen or pelvis. 2. Similar trace free pelvic fluid. 3. Similar nonspecific mid rectal wall thickening. 4.  Aortic Atherosclerosis (ICD10-I70.0).   04/23/2021 Imaging   CT CAP  IMPRESSION: 1. Treated metastatic lesion between segments 2 and 3 of the liver, slightly smaller and less distinct than prior examination. No other signs of definite metastatic disease elsewhere in the abdomen or pelvis. 2. Multiple small pulmonary nodules, stable compared to the prior examination, favored to be benign. No definitive findings to suggest metastatic disease to the thorax. 3. Aortic atherosclerosis. 4. Additional incidental findings, as above.   08/13/2021 Imaging   EXAM: CT CHEST, ABDOMEN, AND PELVIS WITH CONTRAST  IMPRESSION: 1. A previously seen PET avid lesion of the anterior left lobe of the liver, hepatic segment II, is no longer discretely appreciable consistent with treatment response of a hepatic metastasis. 2. No evidence of new metastatic disease in the chest, abdomen, or pelvis. 3. Interval increase in a small focus of consolidation and nodularity of the medial left upper lobe, consistent with minimal, ongoing atypical infection. Additional tiny bilateral pulmonary nodules are  stable and almost certainly incidental benign. Attention on follow-up. 4. Status post right hemicolectomy and ileocolic anastomosis.   Rectal cancer metastasized to liver ([place])  06/15/2020 Procedure   Screening Colonoscopy by Dr [name]  IMPRESSION - Decreased sphincter tone and internal hemorrhoids that prolapse with straining, but require manual replacement into the anal canal (Grade III) found on digital rectal exam. - Patent side-to-side ileo-colonic anastomosis, characterized by healthy appearing mucosa. - The examined portion of the ileum was normal. - One diminutive polyp in the proximal transverse colon, removed with a cold biopsy forceps. Resected and retrieved. - Likely malignant partially obstructing tumor in the mid rectum. Biopsied. Tattooed. - The examination was otherwise normal on direct and retroflexion views.   06/15/2020 Initial Biopsy   Diagnosis 1. Transverse Colon Polyp - HYPERPLASTIC POLYP 2. Rectum, biopsy - ADENOCARCINOMA ARISING IN A TUBULAR ADENOMA WITH HIGH-GRADE DYSPLASIA. SEE NOTE Diagnosis Note 2. Dr. [name] reviewed the case and concurs with the diagnosis. Dr. [name] was notified on 06/16/2020.   06/28/2020 Imaging   CT CAP  IMPRESSION: 1. New low-density focus in the anterior aspect of the lateral segment LEFT hepatic lobe measuring 1.2 x 1.0 cm, compatible with small metastatic lesion in the LEFT hepatic lobe. 2. Soft tissue in the RIGHT iliac fossa following RIGHT hemicolectomy invades the psoas musculature and is slowly enlarging over time, more linear on the prior study now highly concerning for recurrence/metastasis to this location. 3. Signs of enteritis, potentially post radiation changes of the small bowel. Tethered small bowel in the RIGHT lower quadrant shows focal thickening and narrowing  suspicious for small bowel involvement and developing partial obstruction though currently contrast passes beyond this point into the colon. 4.  Rectal thickening in this patient with known rectal mass as described. 5. No evidence of metastatic disease in the chest. 6. Stable small pulmonary nodules. 7.  and aortic atherosclerosis.   Aortic Atherosclerosis (ICD10-I70.0) and Emphysema (ICD10-J43.9).   07/04/2020 Initial Diagnosis   Rectal cancer metastasized to liver ([place])   07/12/2020 PET scan   IMPRESSION: 1. Exam positive for FDG avid rectal tumor which corresponds to the recent colonoscopy findings. 2. FDG avid soft tissue mass within the right iliac fossa is noted and consistent with local tumor recurrence from previous ascending colon tumor. 3. Lateral segment left lobe of liver lesion is FDG avid concerning for liver metastasis. 4. No specific findings identified to suggest metastatic disease to the chest.   08/10/2020 -  Chemotherapy   First-line FOLFIRI q2weeks starting 08/10/20. dose reduced with cycle 1. Irinotecan/5FU increased and Bevacizumab added with cycle 2 on 08/23/2020    08/17/2020 - 03/09/2022 Chemotherapy   Patient is on Treatment Plan : COLORECTAL FOLFIRI + Bevacizumab q14d     10/27/2020 Imaging   CT CAP  IMPRESSION: 1. Interval decrease in size of the hypermetabolic left hepatic lesion, consistent with metastatic disease. No new liver lesion evident. 2. Interval resolution of the hypermetabolic soft tissue lesion along the right iliac fossa with no measurable soft tissue lesion remaining at this location today. 3. Similar appearance of soft tissue fullness in the rectum at the site of the hypermetabolic lesion seen previously. 4. Stable tiny bilateral pulmonary nodules. Continued attention on follow-up recommended. 5. Small volume free fluid in the pelvis. 6. Aortic Atherosclerosis (ICD10-I70.0).   01/22/2021 Imaging   CT CAP  IMPRESSION: CT CHEST IMPRESSION   1. Similar nonspecific pulmonary nodules. 2. New posterior left upper lobe reticulonodular opacity, suspicious for interval mild  infection or inflammation. 3. No thoracic adenopathy.   CT ABDOMEN AND PELVIS IMPRESSION   1. Further decrease in size of high left hepatic lobe 3 mm low-density lesion. No new or progressive metastatic disease within the abdomen or pelvis. 2. Similar trace free pelvic fluid. 3. Similar nonspecific mid rectal wall thickening. 4.  Aortic Atherosclerosis (ICD10-I70.0).   04/23/2021 Imaging   CT CAP  IMPRESSION: 1. Treated metastatic lesion between segments 2 and 3 of the liver, slightly smaller and less distinct than prior examination. No other signs of definite metastatic disease elsewhere in the abdomen or pelvis. 2. Multiple small pulmonary nodules, stable compared to the prior examination, favored to be benign. No definitive findings to suggest metastatic disease to the thorax. 3. Aortic atherosclerosis. 4. Additional incidental findings, as above.   08/13/2021 Imaging   EXAM: CT CHEST, ABDOMEN, AND PELVIS WITH CONTRAST  IMPRESSION: 1. A previously seen PET avid lesion of the anterior left lobe of the liver, hepatic segment II, is no longer discretely appreciable consistent with treatment response of a hepatic metastasis. 2. No evidence of new metastatic disease in the chest, abdomen, or pelvis. 3. Interval increase in a small focus of consolidation and nodularity of the medial left upper lobe, consistent with minimal, ongoing atypical infection. Additional tiny bilateral pulmonary nodules are stable and almost certainly incidental benign. Attention on follow-up. 4. Status post right hemicolectomy and ileocolic anastomosis.   03/28/2022 -  Chemotherapy   Patient is on Treatment Plan : COLORECTAL FOLFOX + Bevacizumab q14d       CURRENT THERAPY: FOLFOX with bevacizumab q. 14  days starting 03/28/2022  INTERVAL HISTORY: This [place] returns for follow-up and treatment as scheduled, last seen by Dr. Burr Medico 03/28/2022 and proceeded with first cycle FOLFOX and continues Beva,  oxaliplatin was dose reduced.  She reports chemo went fairly well in general.  Day 2 was "draining" mostly mentally thinking about what to eat and how she would feel.  Day 3 with pump DC she felt much better.  Able to eat and drink and remain active.  Cold sensitivity was not an issue without residual neuropathy.  She had mild nausea managed with ginger, did not require medication.  Mild constipation is manageable mostly with diet.  Denies significant pain, fever, chills, cough, chest pain, dyspnea, or new specific complaints.  All other systems were reviewed with the patient and are negative.  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 Some residual intraretinal fluid remains, very small localized subfoveal of fluid remains although this continues to slowly resorb. We'll continue to observe.   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  ALLERGIES:  is allergic to fish allergy, peanut-containing drug products, soy allergy, and buspirone.  MEDICATIONS:  Current Outpatient Medications  Medication Sig Dispense Refill   ALPRAZolam (XANAX) 0.25 MG tablet Take 1 tablet (0.25 mg total) by mouth daily as needed for anxiety. 30 tablet 0   Cholecalciferol (VITAMIN D3 PO) Take by mouth daily.     docusate sodium (COLACE) 100 MG capsule 1 capsule as needed     lidocaine-prilocaine (EMLA) cream Apply 1 application topically as needed. 30 g 1   NORVASC 2.5 MG tablet Take 1 tablet (2.5 mg total) by mouth daily. 90 tablet 1   ondansetron (ZOFRAN) 8 MG tablet Take 1 tablet (8 mg total) by mouth every 8 (eight) hours as needed for nausea or vomiting. 20 tablet 2   prochlorperazine (COMPAZINE) 10 MG tablet Take 1 tablet (10 mg total) by mouth every 6 (six) hours as needed for nausea or vomiting. 30 tablet 2   Current Facility-Administered Medications  Medication Dose Route Frequency Provider Last Rate Last Admin   0.9 %  sodium chloride infusion  500 mL Intravenous Once Doran Stabler, MD       Facility-Administered  Medications Ordered in Other Visits  Medication Dose Route Frequency Provider Last Rate Last Admin   fluorouracil (ADRUCIL) 2,700 mg in sodium chloride 0.9 % 96 mL chemo infusion  1,800 mg/m2 (Order-Specific) Intravenous 1 day or 1 dose [name], MD       heparin lock flush 100 unit/mL  500 Units Intracatheter Once PRN [name], MD       leucovorin 600 mg in dextrose 5 % 250 mL infusion  400 mg/m2 (Order-Specific) Intravenous Once [name], MD 140 mL/hr at 04/11/22 1115 600 mg at 04/11/22 1115   oxaliplatin (ELOXATIN) 105 mg in dextrose 5 % 500 mL chemo infusion  70 mg/m2 (Order-Specific) Intravenous Once [name], MD 261 mL/hr at 04/11/22 1118 105 mg at 04/11/22 1118   sodium chloride flush (NS) 0.9 % injection  10 mL  10 mL Intracatheter PRN [name], MD        PHYSICAL EXAMINATION: ECOG PERFORMANCE STATUS: 1 - Symptomatic but completely ambulatory  Vitals:   04/11/22 0923  BP: 133/82  Pulse: 89  Resp: 16  Temp: 98.5 F (36.9 C)  SpO2: 100%   Filed Weights   04/11/22 0923  Weight: 105 lb 12.8 oz (48 kg)    GENERAL:alert, no distress and comfortable SKIN: no rash  EYES:  sclera clear NECK: without mass LUNGS: normal breathing effort HEART: no lower extremity edema NEURO: alert & oriented x 3 with fluent speech PAC with mild erythema near biopatch, no edema or drainage  LABORATORY DATA:  I have reviewed the data as listed    Latest Ref Rng & Units 04/11/2022    9:03 AM 03/28/2022    9:39 AM 03/07/2022    8:50 AM  CBC  WBC 4.0 - 10.5 K/uL 7.5  5.0  6.6   Hemoglobin 12.0 - 15.0 g/dL 9.3  8.9  9.4   Hematocrit 36.0 - 46.0 % 30.6  28.6  30.3   Platelets 150 - 400 K/uL 217  294  193         Latest Ref Rng & Units 04/11/2022    9:03 AM 03/28/2022    9:39 AM 03/07/2022    8:50 AM  CMP  Glucose 70 - 99 mg/dL 86  91  98   BUN 8 - 23 mg/dL '10  10  11   ' Creatinine 0.44 - 1.00 mg/dL 0.62  0.53  0.69   Sodium 135 - 145 mmol/L 137  138  136   Potassium 3.5 - 5.1 mmol/L 4.1  3.7  3.9   Chloride 98 - 111 mmol/L 105  103  105   CO2 22 - 32 mmol/L '28  27  28   ' Calcium 8.9 - 10.3 mg/dL 8.8  8.6  9.0   Total Protein 6.5 - 8.1 g/dL 6.8  6.8  6.6   Total Bilirubin 0.3 - 1.2 mg/dL 0.3  0.3  0.4   Alkaline Phos 38 - 126 U/L 90  78  90   AST 15 - 41 U/L '15  13  12   ' ALT 0 - 44 U/L '6  6  5       ' RADIOGRAPHIC STUDIES: I have personally reviewed the radiological images as listed and agreed with the findings in the report. No results found.   ASSESSMENT & PLAN:  Brittney Spencer is a 71 y.o. female with   1. Rectal adenocarcinoma, with liver and right pelvic metastasis, recurrence from previous colon cancer vs new primary   -Diagnosed on routine  screening colonoscopy 05/2020, rectal  mass biopsy showed invasive adenocarcinoma, arising from a tubular adenoma -Her 07/12/20 PET showed positive uptake of known rectal tumor and soft tissue mass within the right iliac fossa indicating recurrent prior colon cancer. There is also left lobe of liver lesion is FDG avid concerning for liver metastasis. pt declined liver biopsy -whether this is metastatic rectal or recurrent/metastatic colon cancer, this is likely not curable, but still treatable. She previously declined referral for HIPEC -She began first-line systemic chemo with dose-reduced FOLFIRI on 08/06/20, Bevacizumab added, and irinotecan/5FU increased with cycle 2. She eventually changed to maintenance therapy with irinotecan and bevacizumab every 2 weeks on 01/24/2021 -CEA began rising, CT CAP 12/10/2021 showed new 1.5 cm right hepatic lobe metastasis, and stable soft tissue thickening in right iliac fossa and pulmonary nodules. 5FU was added back -Restaging CT CAP 03/18/2022 showed increase in size of right liver lobe metastasis, otherwise stable.  Her CEA continues to rise -She switched to FOLFOX and continued Beva starting 03/28/2022 -Ms. Loomis appears stable.  She tolerated cycle 1 dose reduced FOLFOX well with mental fatigue and mild constipation/nausea.  Side effects were adequately managed with supportive care at home and improved after day 3. .  -She has recovered well, no clinical evidence of disease progression.  Labs are stable -She question if she should be referred to a liver specialist to treat the metastatic disease. We discussed indications to stop systemic treatment for liver directed therapy.  I encouraged her to continue systemic treatment for now.  Briefly discussed maintenance therapy option if she has a good response to FOLFOX/beva in the future. She understands and agreed -Continue bevacizumab and proceed with cycle 2 FOLFOX today as planned, will increase oxalic to 70 mg/m2 due to her good tolerance.  She is aware and  agrees -Follow-up in 2 weeks with cycle 3   2. Cancer of ascending colon, [id number], stage IIIC, MSI-stable, (+) surgical margins at pelvic wall     -She was diagnosed in 12/2015. She is s/p right hemicolectomy with right salpingo oophorectomy and  adjuvant ChemoRT. -she declined adjuvant chemo due to the concern of side effects and impact on her quality of life. -Her 04/2019 CT scan was NED  -With rectal cancer diagnosis, her PET from 07/12/20 indicates soft tissue mass within the right iliac fossa is noted and consistent with local tumor recurrence from previous ascending colon tumor.  Subsequent imaging showed this resolved after beginning treatment -She declined option of liver biopsy.     3. Mild Anemia -Has been stable and mild for the past year. Worse with rectal cancer and starting chemo -denies bleeding -stable in 9 range; monitoring    4. HTN, Anxiety -She'll follow-up with her primary care physician and continue medication -She uses half tab Xanax as needed, mostly only on treatment days. refilled -stable, refilled   Plan: -Labs reviewed -Proceed with bevacizumab and cycle 2 FOLFOX, increase oxali to 70 mg/m2 -Follow-up in 2 weeks with cycle 3   All questions were answered. The patient knows to call the clinic with any problems, questions or concerns. No barriers to learning was detected. I spent 20 minutes counseling the patient face to face. The total time spent in the appointment was 30 minutes and more than 50% was on counseling and review of test results.     [name], NP 04/11/22

## 2022-04-12 ENCOUNTER — Other Ambulatory Visit: Payer: Self-pay

## 2022-04-13 ENCOUNTER — Inpatient Hospital Stay: Payer: Federal, State, Local not specified - PPO

## 2022-04-13 ENCOUNTER — Other Ambulatory Visit: Payer: Self-pay

## 2022-04-13 VITALS — BP 143/77 | HR 98 | Temp 98.1°F | Resp 16

## 2022-04-13 DIAGNOSIS — C20 Malignant neoplasm of rectum: Secondary | ICD-10-CM

## 2022-04-13 DIAGNOSIS — Z5112 Encounter for antineoplastic immunotherapy: Secondary | ICD-10-CM | POA: Diagnosis not present

## 2022-04-13 MED ORDER — SODIUM CHLORIDE 0.9% FLUSH
10.0000 mL | INTRAVENOUS | Status: DC | PRN
  Administered 2022-04-13: 10 mL

## 2022-04-13 MED ORDER — HEPARIN SOD (PORK) LOCK FLUSH 100 UNIT/ML IV SOLN
500.0000 [IU] | Freq: Once | INTRAVENOUS | Status: AC | PRN
  Administered 2022-04-13: 500 [IU]

## 2022-04-13 MED ORDER — PEGFILGRASTIM-JMDB 6 MG/0.6ML ~~LOC~~ SOSY
6.0000 mg | PREFILLED_SYRINGE | Freq: Once | SUBCUTANEOUS | Status: AC
  Administered 2022-04-13: 6 mg via SUBCUTANEOUS

## 2022-04-17 ENCOUNTER — Other Ambulatory Visit: Payer: Self-pay

## 2022-04-24 MED FILL — Dexamethasone Sodium Phosphate Inj 100 MG/10ML: INTRAMUSCULAR | Qty: 1 | Status: AC

## 2022-04-25 ENCOUNTER — Other Ambulatory Visit: Payer: Self-pay

## 2022-04-25 ENCOUNTER — Inpatient Hospital Stay: Payer: Federal, State, Local not specified - PPO

## 2022-04-25 ENCOUNTER — Inpatient Hospital Stay (HOSPITAL_BASED_OUTPATIENT_CLINIC_OR_DEPARTMENT_OTHER): Payer: Federal, State, Local not specified - PPO | Admitting: Hematology

## 2022-04-25 ENCOUNTER — Encounter: Payer: Self-pay | Admitting: Hematology

## 2022-04-25 VITALS — BP 125/79 | HR 86 | Temp 98.6°F | Resp 16 | Ht 66.0 in | Wt 105.1 lb

## 2022-04-25 DIAGNOSIS — C787 Secondary malignant neoplasm of liver and intrahepatic bile duct: Secondary | ICD-10-CM | POA: Diagnosis not present

## 2022-04-25 DIAGNOSIS — Z95828 Presence of other vascular implants and grafts: Secondary | ICD-10-CM

## 2022-04-25 DIAGNOSIS — Z5112 Encounter for antineoplastic immunotherapy: Secondary | ICD-10-CM | POA: Diagnosis not present

## 2022-04-25 DIAGNOSIS — Z66 Do not resuscitate: Secondary | ICD-10-CM

## 2022-04-25 DIAGNOSIS — C20 Malignant neoplasm of rectum: Secondary | ICD-10-CM

## 2022-04-25 LAB — CMP (CANCER CENTER ONLY)
ALT: 6 U/L (ref 0–44)
AST: 14 U/L — ABNORMAL LOW (ref 15–41)
Albumin: 3.4 g/dL — ABNORMAL LOW (ref 3.5–5.0)
Alkaline Phosphatase: 87 U/L (ref 38–126)
Anion gap: 4 — ABNORMAL LOW (ref 5–15)
BUN: 11 mg/dL (ref 8–23)
CO2: 28 mmol/L (ref 22–32)
Calcium: 8.7 mg/dL — ABNORMAL LOW (ref 8.9–10.3)
Chloride: 105 mmol/L (ref 98–111)
Creatinine: 0.63 mg/dL (ref 0.44–1.00)
GFR, Estimated: >60 mL/min (ref >60)
Glucose, Bld: 80 mg/dL (ref 70–99)
Potassium: 4 mmol/L (ref 3.5–5.1)
Sodium: 137 mmol/L (ref 135–145)
Total Bilirubin: 0.2 mg/dL — ABNORMAL LOW (ref 0.3–1.2)
Total Protein: 6.4 g/dL — ABNORMAL LOW (ref 6.5–8.1)

## 2022-04-25 LAB — CBC WITH DIFFERENTIAL (CANCER CENTER ONLY)
Abs Immature Granulocytes: 0.03 10*3/uL (ref 0.00–0.07)
Basophils Absolute: 0 10*3/uL (ref 0.0–0.1)
Basophils Relative: 0 %
Eosinophils Absolute: 0.1 10*3/uL (ref 0.0–0.5)
Eosinophils Relative: 1 %
HCT: 28.8 % — ABNORMAL LOW (ref 36.0–46.0)
Hemoglobin: 8.8 g/dL — ABNORMAL LOW (ref 12.0–15.0)
Immature Granulocytes: 1 %
Lymphocytes Relative: 13 %
Lymphs Abs: 0.9 10*3/uL (ref 0.7–4.0)
MCH: 24.9 pg — ABNORMAL LOW (ref 26.0–34.0)
MCHC: 30.6 g/dL (ref 30.0–36.0)
MCV: 81.6 fL (ref 80.0–100.0)
Monocytes Absolute: 0.7 10*3/uL (ref 0.1–1.0)
Monocytes Relative: 10 %
Neutro Abs: 4.9 10*3/uL (ref 1.7–7.7)
Neutrophils Relative %: 75 %
Platelet Count: 193 10*3/uL (ref 150–400)
RBC: 3.53 MIL/uL — ABNORMAL LOW (ref 3.87–5.11)
RDW: 17.7 % — ABNORMAL HIGH (ref 11.5–15.5)
WBC Count: 6.6 10*3/uL (ref 4.0–10.5)
nRBC: 0 % (ref 0.0–0.2)

## 2022-04-25 LAB — TOTAL PROTEIN, URINE DIPSTICK: Protein, ur: NEGATIVE mg/dL

## 2022-04-25 MED ORDER — SODIUM CHLORIDE 0.9 % IV SOLN
1800.0000 mg/m2 | INTRAVENOUS | Status: DC
  Administered 2022-04-25: 2700 mg via INTRAVENOUS
  Filled 2022-04-25: qty 54

## 2022-04-25 MED ORDER — SODIUM CHLORIDE 0.9 % IV SOLN
5.0000 mg/kg | Freq: Once | INTRAVENOUS | Status: AC
  Administered 2022-04-25: 250 mg via INTRAVENOUS
  Filled 2022-04-25: qty 10

## 2022-04-25 MED ORDER — SODIUM CHLORIDE 0.9 % IV SOLN
10.0000 mg | Freq: Once | INTRAVENOUS | Status: AC
  Administered 2022-04-25: 10 mg via INTRAVENOUS
  Filled 2022-04-25: qty 10

## 2022-04-25 MED ORDER — SODIUM CHLORIDE 0.9% FLUSH
10.0000 mL | Freq: Once | INTRAVENOUS | Status: AC
  Administered 2022-04-25: 10 mL

## 2022-04-25 MED ORDER — PALONOSETRON HCL INJECTION 0.25 MG/5ML
0.2500 mg | Freq: Once | INTRAVENOUS | Status: AC
  Administered 2022-04-25: 0.25 mg via INTRAVENOUS
  Filled 2022-04-25: qty 5

## 2022-04-25 MED ORDER — DEXTROSE 5 % IV SOLN: Freq: Once | INTRAVENOUS | Status: AC

## 2022-04-25 MED ORDER — LEUCOVORIN CALCIUM INJECTION 350 MG
400.0000 mg/m2 | Freq: Once | INTRAVENOUS | Status: AC
  Administered 2022-04-25: 600 mg via INTRAVENOUS
  Filled 2022-04-25: qty 30

## 2022-04-25 MED ORDER — SODIUM CHLORIDE 0.9 % IV SOLN: Freq: Once | INTRAVENOUS | Status: AC

## 2022-04-25 MED ORDER — OXALIPLATIN CHEMO INJECTION 100 MG/20ML
70.0000 mg/m2 | Freq: Once | INTRAVENOUS | Status: AC
  Administered 2022-04-25: 105 mg via INTRAVENOUS
  Filled 2022-04-25: qty 21

## 2022-04-25 NOTE — Patient Instructions (Signed)
[facility]  Discharge Instructions: Thank you for choosing [facility] to provide your oncology and hematology care.   If you have a lab appointment with the Cancer Center, please go directly to the Cancer Center and check in at the registration area.   Wear comfortable clothing and clothing appropriate for easy access to any Portacath or PICC line.   We strive to give you quality time with your provider. You may need to reschedule your appointment if you arrive late (15 or more minutes).  Arriving late affects you and other patients whose appointments are after yours.  Also, if you miss three or more appointments without notifying the office, you may be dismissed from the clinic at the provider's discretion.      For prescription refill requests, have your pharmacy contact our office and allow 72 hours for refills to be completed.    Today you received the following chemotherapy and/or immunotherapy agents: Bevacizumab, Lecovorin, Irinotecan, and Fluorouracil   To help prevent nausea and vomiting after your treatment, we encourage you to take your nausea medication as directed.  BELOW ARE SYMPTOMS THAT SHOULD BE REPORTED IMMEDIATELY: *FEVER GREATER THAN 100.4 F (38 C) OR HIGHER *CHILLS OR SWEATING *NAUSEA AND VOMITING THAT IS NOT CONTROLLED WITH YOUR NAUSEA MEDICATION *UNUSUAL SHORTNESS OF BREATH *UNUSUAL BRUISING OR BLEEDING *URINARY PROBLEMS (pain or burning when urinating, or frequent urination) *BOWEL PROBLEMS (unusual diarrhea, constipation, pain near the anus) TENDERNESS IN MOUTH AND THROAT WITH OR WITHOUT PRESENCE OF ULCERS (sore throat, sores in mouth, or a toothache) UNUSUAL RASH, SWELLING OR PAIN  UNUSUAL VAGINAL DISCHARGE OR ITCHING   Items with * indicate a potential emergency and should be followed up as soon as possible or go to the Emergency Department if any problems should occur.  Please show the CHEMOTHERAPY ALERT CARD or  IMMUNOTHERAPY ALERT CARD at check-in to the Emergency Department and triage nurse.  Should you have questions after your visit or need to cancel or reschedule your appointment, please contact [facility]  Dept: [phone]  and follow the prompts.  Office hours are 8:00 a.m. to 4:30 p.m. Monday - Friday. Please note that voicemails left after 4:00 p.m. may not be returned until the following business day.  We are closed weekends and major holidays. You have access to a nurse at all times for urgent questions. Please call the main number to the clinic Dept: [phone] and follow the prompts.   For any non-urgent questions, you may also contact your provider using MyChart. We now offer e-Visits for anyone 18 and older to request care online for non-urgent symptoms. For details visit [email or web address].   Also download the MyChart app! Go to the app store, search "MyChart", open the app, select [facility], and log in with your MyChart username and password.  Masks are optional in the cancer centers. If you would like for your care team to wear a mask while they are taking care of you, please let them know. You may have one support person who is at least 73 years old accompany you for your appointments. The chemotherapy medication bag should finish at 46 hours, 96 hours, or 7 days. For example, if your pump is scheduled for 46 hours and it was put on at 4:00 p.m., it should finish at 2:00 p.m. the day it is scheduled to come off regardless of your appointment time.     Estimated time to finish at:     If the display on your pump reads "Low Volume" and it is beeping, take the batteries out of the pump and come to the cancer center for it to be taken off.   If the pump alarms go off prior to the pump reading "Low Volume" then call [phone] and someone can assist you.  If the plunger comes out and the chemotherapy medication is leaking out, please use your home  chemo spill kit to clean up the spill. Do NOT use paper towels or other household products.  If you have problems or questions regarding your pump, please call either [phone] (24 hours a day) or the cancer center Monday-Friday 8:00 a.m.- 4:30 p.m. at the clinic number and we will assist you. If you are unable to get assistance, then go to the nearest Emergency Department and ask the staff to contact the IV team for assistance.   

## 2022-04-25 NOTE — ACP (Advance Care Planning) (Signed)
Patient has agreed DNR on 04/25/2022  [name] MD

## 2022-04-25 NOTE — Progress Notes (Signed)
[name]   [phone] [phone]   Clinic Follow up Note   Patient Care Team: Patient, No Pcp Per as PCP - General (General Practice) [name], NP as Nurse Practitioner (Oncology) [name], MD as Consulting Physician (Oncology) [name], RN (Inactive) as Oncology Nurse Navigator  Date of Service:  04/25/2022  CHIEF COMPLAINT: f/u of metastatic rectal cancer, h/o colon cancer  CURRENT THERAPY:  FOLFOX with bevacizumab, q14d, starting 03/28/22  ASSESSMENT & PLAN:  Brittney Spencer is a 71 y.o. female with   1. Rectal adenocarcinoma, with liver and right pelvic metastasis, recurrence from previous colon cancer vs new primary   -Diagnosed on screening colonoscopy 05/2020, rectal mass biopsy showed invasive adenocarcinoma, arising from a tubular adenoma -Her 07/12/20 PET showed positive uptake of known rectal tumor and soft tissue mass within the right iliac fossa indicating recurrent prior colon cancer. There is also left lobe of liver lesion is FDG avid concerning for liver metastasis. pt declined liver biopsy -She previously declined referral for HIPEC -She began first-line chemo with dose-reduced FOLFIRI on 08/06/20, Bevacizumab added with cycle 2. She changed to maintenance therapy with irinotecan and bevacizumab every 2 weeks on 01/24/21. -restaging CT CAP 12/10/21 showed: new 1.5 cm right hepatic lobe metastasis . Her CEA had also been slowly rising, up to 11.33 on 12/13/21.  -5FU was added back to her treatment on 12/27/21. She is tolerating well at reduced dose. -restaging CT CAP 03/18/22 showed increase in size of right liver lobe metastasis, otherwise stable. Her CEA has continued to rise despite the addition of 5-FU, most recently 22.9 on 03/07/22.  -we changed to FOLFOX with continued Beva on 03/28/22. She tolerated well overall with some fatigue and cold sensitivity. -lab reviewed, hgb down slightly to 8.8, otherwise stable.  Urine protein negative. Adequate to proceed with treatment.  2. Goal of care discussion, DNR -We again discussed the incurable nature of her cancer, and the overall poor prognosis, especially if she does not have good response to chemotherapy or progress on chemo -The patient understands the goal of care is palliative.  She had many questions regarding the prognosis and treatment options and I answered to the best of my knowledge. -I recommend DNR/DNI, she agrees today (04/25/22)   3. Cancer of ascending colon, pT4bN1bM0, stage IIIC, MSI-stable, (+) surgical margins at pelvic wall     -diagnosed in 12/2015. S/p right hemicolectomy with right salpingo oophorectomy and adjuvant ChemoRT. -Her 04/2019 CT scan was NED  -With recent rectal cancer diagnosis, her PET from 07/12/20 indicates soft tissue mass within the right iliac fossa is noted and consistent with local tumor recurrence from previous ascending colon tumor.   4. Mild Anemia -Has been stable and mild for the past year.  -worse lately due to rectal cancer and starting chemo -denies bleeding -monitoring, stable in 9-range.   5. HTN, Anxiety -She uses half tab Xanax as needed, mostly only on treatment days. -continued medications and f/u per PCP -stable     PLAN: -proceed with C3 FOLFOX/beva today at same dose  -lab, flush, f/u, and FOLFOX/beva in 2 and 4 weeks -Pt agreed DNR, I documented and gave her a copy    No problem-specific Assessment & Plan notes found for this encounter.   SUMMARY OF ONCOLOGIC HISTORY: Oncology History Overview Note  Cancer Staging Cancer of ascending colon [facility]) Staging form: Colon and Rectum, AJCC 7th Edition - Clinical stage from 02/07/2016: Stage IIIC (T4b, N1b,  M0) - Signed by [name], MD on 03/04/2016 Laterality: Right Residual tumor (R): R2 - Macroscopic    Cancer of ascending colon ([place])  10/25/2015 Imaging   CT ABD/PELVIS:  Inflammatory changes inferior to the cecal tip appear improved,  there is still irregular soft tissue thickening of the cecal tip, and there are adjacent prominent lymph nodes in the ileocolonic mesentery, measuring 13 mm on image 49 and 8 mm on image 52. In addition, there is a 2.5 x 1.8 cm nodule on image 46 which has central low density. Therefore, these findings are moderately suspicious for an underlying cecal malignancy with perforation.    01/19/2016 Procedure   COLONOSCOPY per Dr. [name]: Fungating, ulcerated mass almost obstructing mid ascending colon   01/19/2016 Initial Biopsy   Diagnosis Surgical [P], cecal mass - INVASIVE ADENOCARCINOMA WITH ULCERATION. - SEE COMMENT.   02/05/2016 Tumor Marker   Patient's tumor was tested for the following markers: CEA Results of the tumor marker test revealed 5.7.   02/07/2016 Initial Diagnosis   Cancer of ascending colon ([place])   02/07/2016 Definitive Surgery   Laparoscopic assisted right hemicolectomy and right salpingo oopherectomy--Dr. [name]   02/07/2016 Pathologic Stage   p T4 N1b   2/43 nodes +   02/07/2016 Pathology Results   MMR normal; G2 adenocarcinoma;proximal & distal margins negative; soft tissue mass on pelvic sidewall + for adenocarcinoma with positive margin MSI Stable   03/08/2016 Imaging   CT chest negative for metastasis.    03/19/2016 - 04/25/2016 Radiation Therapy   Adjuvant irradiation, 50 gray in 28 fractions   03/19/2016 - 04/22/2016 Chemotherapy   Xeloda 1500 mg twice daily, started on 03/19/2016, dose reduced to 1000 mg twice daily from week 3 due to neutropenia, and patient stopped 3 days before last dose radiation due to difficulty swallowing the pill    05/20/2016 -  Adjuvant Chemotherapy   Patient declined adjuvant chemotherapy   09/16/2016 Imaging   CT CAP w Contrast 1. No evidence of local tumor recurrence at the ileocolic anastomosis. 2. No findings suspicious for metastatic disease in the chest, abdomen or pelvis. 3. Nonspecific trace free fluid in the pelvic  cul-de-sac. 4. Stable solitary 3 mm right upper lobe pulmonary nodule, for which 6 month stability has been demonstrated, probably benign. 5. Additional findings include stable right posterior pericardial cyst and small calcified uterine fibroids.   05/13/2017 Imaging   CT CAP W Contrast 05/13/17 IMPRESSION: 1. No current findings of residual or recurrent malignancy. 2. Mild prominence of stool throughout the colon. Nondistended portions of the rectum. 3. Several tiny pulmonary nodules are stable from the earliest available comparison of 03/08/2016 and probably benign, but may merit surveillance. 4. Other imaging findings of potential clinical significance: Old granulomatous disease. Aortoiliac atherosclerotic vascular disease. Lumbar spondylosis and degenerative disc disease. Stable amount of trace free pelvic fluid.   04/27/2018 Imaging   04/27/2018 CT CAP IMPRESSION: Stable exam. No evidence of recurrent or metastatic carcinoma within the chest, abdomen, or pelvis   04/19/2019 Imaging   CT CAP W Contrast  IMPRESSION: Chest Impression:   1. No evidence of thoracic metastasis. 2. Stable small bilateral pulmonary nodules.   Abdomen / Pelvis Impression:   1. No evidence local colorectal carcinoma recurrence or metastasis in the abdomen pelvis. 2. Post RIGHT hemicolectomy.   01/22/2021 Imaging   CT CAP  IMPRESSION: CT CHEST IMPRESSION   1. Similar nonspecific pulmonary nodules. 2. New posterior left upper lobe reticulonodular opacity, suspicious for interval mild  infection or inflammation. 3. No thoracic adenopathy.   CT ABDOMEN AND PELVIS IMPRESSION   1. Further decrease in size of high left hepatic lobe 3 mm low-density lesion. No new or progressive metastatic disease within the abdomen or pelvis. 2. Similar trace free pelvic fluid. 3. Similar nonspecific mid rectal wall thickening. 4.  Aortic Atherosclerosis (ICD10-I70.0).   04/23/2021 Imaging   CT  CAP  IMPRESSION: 1. Treated metastatic lesion between segments 2 and 3 of the liver, slightly smaller and less distinct than prior examination. No other signs of definite metastatic disease elsewhere in the abdomen or pelvis. 2. Multiple small pulmonary nodules, stable compared to the prior examination, favored to be benign. No definitive findings to suggest metastatic disease to the thorax. 3. Aortic atherosclerosis. 4. Additional incidental findings, as above.   08/13/2021 Imaging   EXAM: CT CHEST, ABDOMEN, AND PELVIS WITH CONTRAST  IMPRESSION: 1. A previously seen PET avid lesion of the anterior left lobe of the liver, hepatic segment II, is no longer discretely appreciable consistent with treatment response of a hepatic metastasis. 2. No evidence of new metastatic disease in the chest, abdomen, or pelvis. 3. Interval increase in a small focus of consolidation and nodularity of the medial left upper lobe, consistent with minimal, ongoing atypical infection. Additional tiny bilateral pulmonary nodules are stable and almost certainly incidental benign. Attention on follow-up. 4. Status post right hemicolectomy and ileocolic anastomosis.   Rectal cancer metastasized to liver ([place])  06/15/2020 Procedure   Screening Colonoscopy by Dr [name]  IMPRESSION - Decreased sphincter tone and internal hemorrhoids that prolapse with straining, but require manual replacement into the anal canal (Grade III) found on digital rectal exam. - Patent side-to-side ileo-colonic anastomosis, characterized by healthy appearing mucosa. - The examined portion of the ileum was normal. - One diminutive polyp in the proximal transverse colon, removed with a cold biopsy forceps. Resected and retrieved. - Likely malignant partially obstructing tumor in the mid rectum. Biopsied. Tattooed. - The examination was otherwise normal on direct and retroflexion views.   06/15/2020 Initial Biopsy   Diagnosis 1.  Transverse Colon Polyp - HYPERPLASTIC POLYP 2. Rectum, biopsy - ADENOCARCINOMA ARISING IN A TUBULAR ADENOMA WITH HIGH-GRADE DYSPLASIA. SEE NOTE Diagnosis Note 2. Dr. [name] reviewed the case and concurs with the diagnosis. Dr. [name] was notified on 06/16/2020.   06/28/2020 Imaging   CT CAP  IMPRESSION: 1. New low-density focus in the anterior aspect of the lateral segment LEFT hepatic lobe measuring 1.2 x 1.0 cm, compatible with small metastatic lesion in the LEFT hepatic lobe. 2. Soft tissue in the RIGHT iliac fossa following RIGHT hemicolectomy invades the psoas musculature and is slowly enlarging over time, more linear on the prior study now highly concerning for recurrence/metastasis to this location. 3. Signs of enteritis, potentially post radiation changes of the small bowel. Tethered small bowel in the RIGHT lower quadrant shows focal thickening and narrowing suspicious for small bowel involvement and developing partial obstruction though currently contrast passes beyond this point into the colon. 4. Rectal thickening in this patient with known rectal mass as described. 5. No evidence of metastatic disease in the chest. 6. Stable small pulmonary nodules. 7.  and aortic atherosclerosis.   Aortic Atherosclerosis (ICD10-I70.0) and Emphysema (ICD10-J43.9).   07/04/2020 Initial Diagnosis   Rectal cancer metastasized to liver ([place])   07/12/2020 PET scan   IMPRESSION: 1. Exam positive for FDG avid rectal tumor which corresponds to the recent colonoscopy findings. 2. FDG avid soft tissue mass within  the right iliac fossa is noted and consistent with local tumor recurrence from previous ascending colon tumor. 3. Lateral segment left lobe of liver lesion is FDG avid concerning for liver metastasis. 4. No specific findings identified to suggest metastatic disease to the chest.   08/10/2020 -  Chemotherapy   First-line FOLFIRI q2weeks starting 08/10/20. dose reduced with cycle 1.  Irinotecan/5FU increased and Bevacizumab added with cycle 2 on 08/23/2020    08/17/2020 - 03/09/2022 Chemotherapy   Patient is on Treatment Plan : COLORECTAL FOLFIRI + Bevacizumab q14d     10/27/2020 Imaging   CT CAP  IMPRESSION: 1. Interval decrease in size of the hypermetabolic left hepatic lesion, consistent with metastatic disease. No new liver lesion evident. 2. Interval resolution of the hypermetabolic soft tissue lesion along the right iliac fossa with no measurable soft tissue lesion remaining at this location today. 3. Similar appearance of soft tissue fullness in the rectum at the site of the hypermetabolic lesion seen previously. 4. Stable tiny bilateral pulmonary nodules. Continued attention on follow-up recommended. 5. Small volume free fluid in the pelvis. 6. Aortic Atherosclerosis (ICD10-I70.0).   01/22/2021 Imaging   CT CAP  IMPRESSION: CT CHEST IMPRESSION   1. Similar nonspecific pulmonary nodules. 2. New posterior left upper lobe reticulonodular opacity, suspicious for interval mild infection or inflammation. 3. No thoracic adenopathy.   CT ABDOMEN AND PELVIS IMPRESSION   1. Further decrease in size of high left hepatic lobe 3 mm low-density lesion. No new or progressive metastatic disease within the abdomen or pelvis. 2. Similar trace free pelvic fluid. 3. Similar nonspecific mid rectal wall thickening. 4.  Aortic Atherosclerosis (ICD10-I70.0).   04/23/2021 Imaging   CT CAP  IMPRESSION: 1. Treated metastatic lesion between segments 2 and 3 of the liver, slightly smaller and less distinct than prior examination. No other signs of definite metastatic disease elsewhere in the abdomen or pelvis. 2. Multiple small pulmonary nodules, stable compared to the prior examination, favored to be benign. No definitive findings to suggest metastatic disease to the thorax. 3. Aortic atherosclerosis. 4. Additional incidental findings, as above.   08/13/2021 Imaging    EXAM: CT CHEST, ABDOMEN, AND PELVIS WITH CONTRAST  IMPRESSION: 1. A previously seen PET avid lesion of the anterior left lobe of the liver, hepatic segment II, is no longer discretely appreciable consistent with treatment response of a hepatic metastasis. 2. No evidence of new metastatic disease in the chest, abdomen, or pelvis. 3. Interval increase in a small focus of consolidation and nodularity of the medial left upper lobe, consistent with minimal, ongoing atypical infection. Additional tiny bilateral pulmonary nodules are stable and almost certainly incidental benign. Attention on follow-up. 4. Status post right hemicolectomy and ileocolic anastomosis.   03/28/2022 -  Chemotherapy   Patient is on Treatment Plan : COLORECTAL FOLFOX + Bevacizumab q14d        INTERVAL HISTORY:  Brittney Spencer is here for a follow up of metastatic rectal cancer. She was last seen by NP Lacie on 04/11/22. She presents to the clinic alone. She reports more fatigue and cold sensitivity. She notes the sensitivity has resolved.   All other systems were reviewed with the patient and are negative.  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 Some residual intraretinal fluid remains, very small localized subfoveal of fluid remains although this continues to slowly resorb. We'll continue to observe.   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  ALLERGIES:  is allergic to fish allergy, peanut-containing drug products, soy allergy, and buspirone.  MEDICATIONS:  Current Outpatient Medications  Medication Sig Dispense Refill   ALPRAZolam (XANAX) 0.25 MG tablet Take 1 tablet (0.25 mg total) by mouth daily as needed for anxiety. 30 tablet 0   Cholecalciferol (VITAMIN D3 PO) Take by mouth daily.     docusate sodium (COLACE) 100 MG capsule 1 capsule as needed     lidocaine-prilocaine (EMLA) cream Apply 1 application topically as needed. 30 g 1   NORVASC 2.5 MG tablet Take 1 tablet (2.5 mg total) by mouth daily. 90 tablet 1   ondansetron (ZOFRAN) 8 MG tablet Take 1 tablet (8 mg total) by mouth every 8 (eight) hours as needed for nausea or vomiting. 20 tablet 2   prochlorperazine (COMPAZINE) 10 MG tablet Take 1 tablet (10 mg total) by mouth every 6 (six) hours as needed for nausea or vomiting. 30 tablet 2   Current Facility-Administered Medications  Medication Dose Route Frequency Provider Last Rate Last Admin   0.9 %  sodium chloride infusion  500 mL Intravenous Once Doran Stabler, MD       Facility-Administered Medications Ordered in Other Visits  Medication Dose Route Frequency Provider Last Rate Last Admin   bevacizumab-bvzr (ZIRABEV) 250 mg in sodium chloride 0.9 % 100 mL chemo infusion  5 mg/kg (Order-Specific)  Intravenous Once [name], MD       dextrose 5 % solution   Intravenous Once [name], MD       fluorouracil (ADRUCIL) 2,700 mg in sodium chloride 0.9 % 96 mL chemo infusion  1,800 mg/m2 (Order-Specific) Intravenous 1 day or 1 dose [name], MD       leucovorin 600 mg in dextrose 5 % 250 mL infusion  400 mg/m2 (Order-Specific) Intravenous Once [name], MD       oxaliplatin (ELOXATIN) 105 mg in dextrose 5 % 500 mL chemo infusion  70 mg/m2 (Order-Specific) Intravenous Once [name], MD        PHYSICAL EXAMINATION: ECOG PERFORMANCE STATUS: 1 - Symptomatic but completely ambulatory  Vitals:   04/25/22 0908  BP: 125/79  Pulse: 86  Resp: 16  Temp: 98.6 F (37 C)  SpO2: 100%   Wt  Readings from Last 3 Encounters:  04/25/22 105 lb 1.6 oz (47.7 kg)  04/11/22 105 lb 12.8 oz (48 kg)  03/28/22 106 lb 11.2 oz (48.4 kg)     GENERAL:alert, no distress and comfortable SKIN: skin color normal, no rashes or significant lesions EYES: normal, Conjunctiva are pink and non-injected, sclera clear  NEURO: alert & oriented x 3 with fluent speech  LABORATORY DATA:  I have reviewed the data as listed    Latest Ref Rng & Units 04/25/2022    8:24 AM 04/11/2022    9:03 AM 03/28/2022    9:39 AM  CBC  WBC 4.0 - 10.5 K/uL 6.6  7.5  5.0   Hemoglobin 12.0 - 15.0 g/dL 8.8  9.3  8.9   Hematocrit 36.0 - 46.0 % 28.8  30.6  28.6   Platelets 150 - 400 K/uL 193  217  294         Latest Ref Rng & Units 04/25/2022    8:24 AM 04/11/2022    9:03 AM 03/28/2022    9:39 AM  CMP  Glucose 70 - 99 mg/dL 80  86  91   BUN 8 - 23 mg/dL _0 Creatinine 0.44 - 1.00 mg/dL 0.63  0.62  0.53   Sodium 135 - 145 mmol/L 137  137  138   Potassium 3.5 - 5.1 mmol/L 4.0  4.1  3.7   Chloride 98 - 111 mmol/L 105  105  103   CO2 22 - 32 mmol/L _1 Calcium 8.9 - 10.3 mg/dL 8.7  8.8  8.6   Total Protein 6.5 - 8.1 g/dL 6.4  6.8  6.8   Total Bilirubin 0.3 - 1.2 mg/dL 0.2  0.3  0.3   Alkaline Phos 38 - 126 U/L  87  90  78   AST 15 - 41 U/L _2 ALT 0 - 44 U/L _3 RADIOGRAPHIC STUDIES: I have personally reviewed the radiological images as listed and agreed with the findings in the report. No results found.    Orders Placed This Encounter  Procedures   CBC with Differential ([place] Only)    Standing Status:   Future    Standing Expiration Date:   05/31/2023   CMP ([place] only)    Standing Status:   Future    Standing Expiration Date:   05/31/2023   CBC with Differential ([place] Only)    Standing Status:   Future    Standing Expiration Date:   06/14/2023   CMP ([place] only)    Standing Status:   Future    Standing Expiration Date:   06/14/2023   Total Protein, Urine dipstick    Standing Status:   Future    Standing Expiration Date:   06/14/2023   All questions were answered. The patient knows to call the clinic with any problems, questions or concerns. No barriers to learning was detected. The total time spent in the appointment was 40 minutes.     [name], MD 04/25/2022   I, Wilburn Mylar, am acting as scribe for [name], MD.   I have reviewed the above documentation for accuracy and completeness, and I agree with the above.

## 2022-04-27 ENCOUNTER — Inpatient Hospital Stay: Payer: Federal, State, Local not specified - PPO

## 2022-04-27 ENCOUNTER — Other Ambulatory Visit: Payer: Self-pay

## 2022-04-27 VITALS — BP 137/73 | HR 92 | Temp 98.1°F | Resp 14 | Ht 66.0 in | Wt 105.0 lb

## 2022-04-27 DIAGNOSIS — Z5112 Encounter for antineoplastic immunotherapy: Secondary | ICD-10-CM | POA: Diagnosis not present

## 2022-04-27 DIAGNOSIS — C787 Secondary malignant neoplasm of liver and intrahepatic bile duct: Secondary | ICD-10-CM

## 2022-04-27 MED ORDER — HEPARIN SOD (PORK) LOCK FLUSH 100 UNIT/ML IV SOLN
500.0000 [IU] | Freq: Once | INTRAVENOUS | Status: AC | PRN
  Administered 2022-04-27: 500 [IU]

## 2022-04-27 MED ORDER — PEGFILGRASTIM-JMDB 6 MG/0.6ML ~~LOC~~ SOSY
6.0000 mg | PREFILLED_SYRINGE | Freq: Once | SUBCUTANEOUS | Status: AC
  Administered 2022-04-27: 6 mg via SUBCUTANEOUS
  Filled 2022-04-27: qty 0.6

## 2022-04-27 MED ORDER — SODIUM CHLORIDE 0.9% FLUSH
10.0000 mL | INTRAVENOUS | Status: DC | PRN
  Administered 2022-04-27: 10 mL

## 2022-04-27 NOTE — Patient Instructions (Signed)

## 2022-05-02 ENCOUNTER — Encounter (INDEPENDENT_AMBULATORY_CARE_PROVIDER_SITE_OTHER): Payer: Federal, State, Local not specified - PPO | Admitting: Ophthalmology

## 2022-05-07 ENCOUNTER — Other Ambulatory Visit: Payer: Self-pay

## 2022-05-09 ENCOUNTER — Ambulatory Visit: Payer: Federal, State, Local not specified - PPO

## 2022-05-09 ENCOUNTER — Other Ambulatory Visit: Payer: Federal, State, Local not specified - PPO

## 2022-05-09 ENCOUNTER — Ambulatory Visit: Payer: Federal, State, Local not specified - PPO | Admitting: Hematology

## 2022-05-13 IMAGING — CT CT CHEST-ABD-PELV W/ CM
2 of 6 series · 11 of 36 positions shown, 16 images · IV contrast (OMNIPAQUE)
Comparison: CT chest abdomen pelvis, 04/23/2021, PET-CT, 07/12/2018

CLINICAL DATA: Recurrent colorectal cancer, ongoing chemotherapy,
radiation therapy complete

EXAM:
CT CHEST, ABDOMEN, AND PELVIS WITH CONTRAST
TECHNIQUE: Multidetector CT imaging of the chest, abdomen and pelvis was
performed following the standard protocol during bolus
administration of intravenous contrast.

[Series 2: cap with · axial · 0.66mm/px · z∈[-555,-105]mm · 8 of 116 slices shown, 13 images]
[im 13/116  mediastinal]
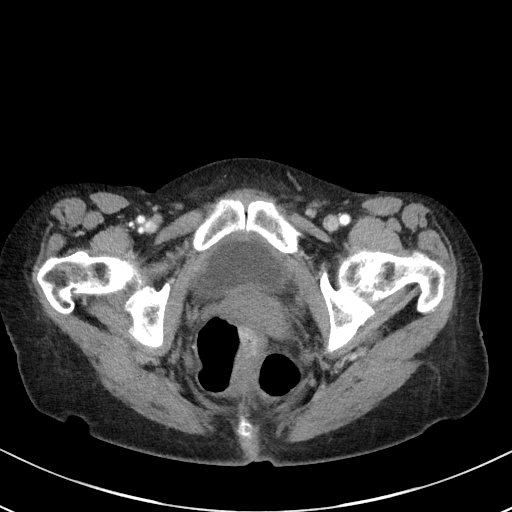
[im 13/116  bone]
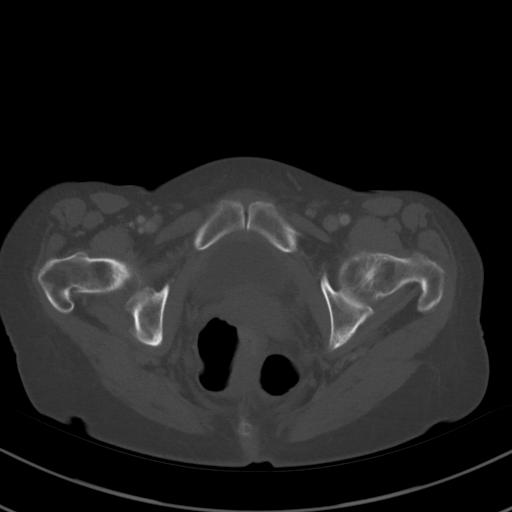
[im 26/116  mediastinal]
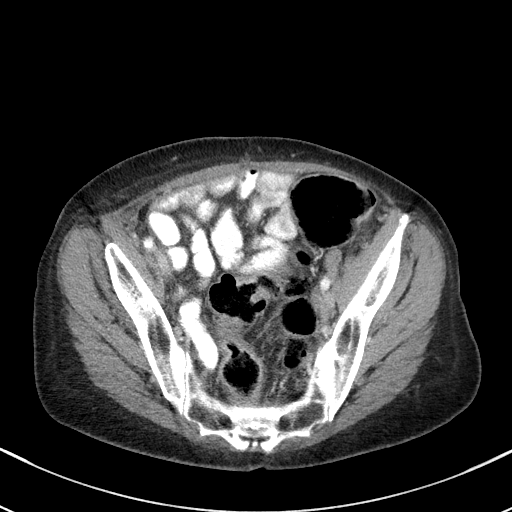
[im 39/116  mediastinal]
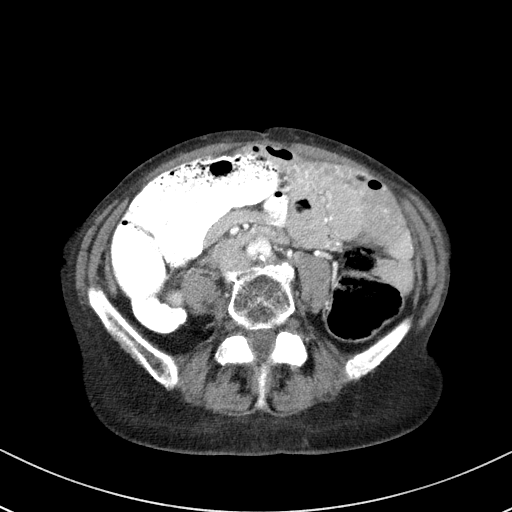
[im 52/116  mediastinal]
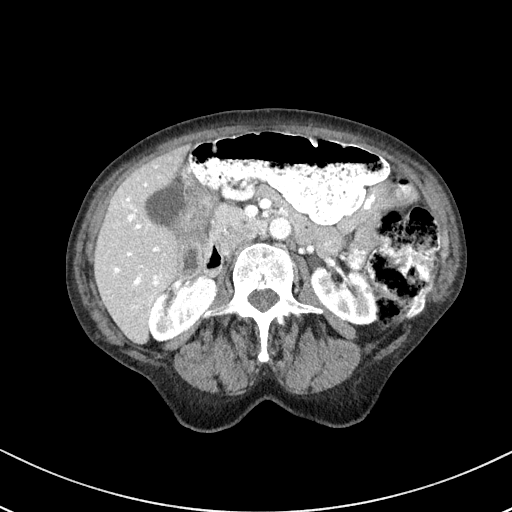
[im 64/116  mediastinal]
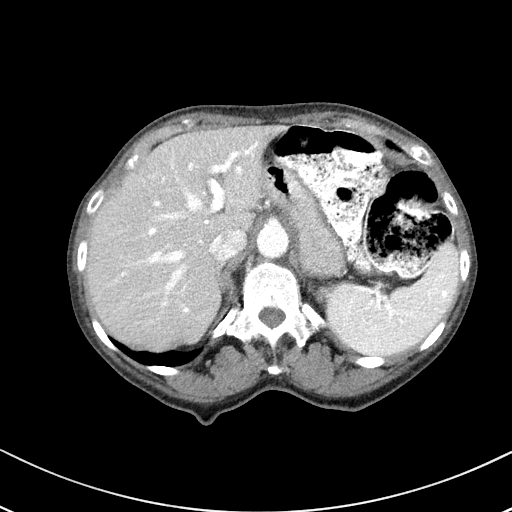
[im 64/116  lung]
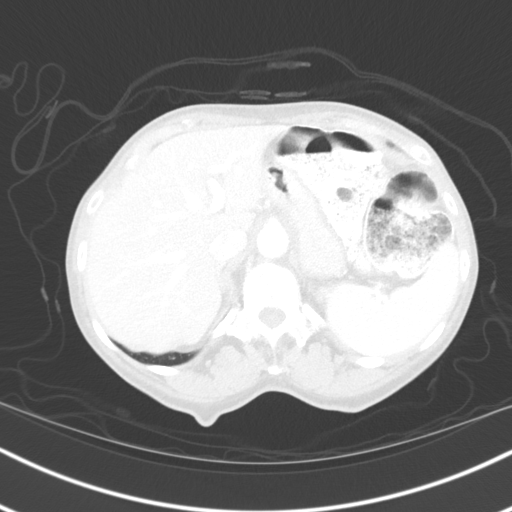
[im 77/116  mediastinal]
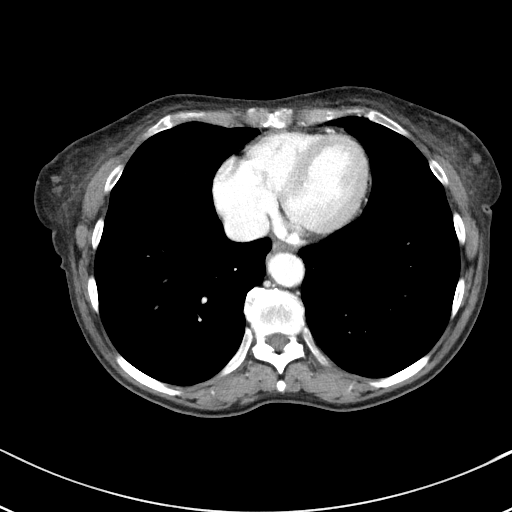
[im 77/116  lung]
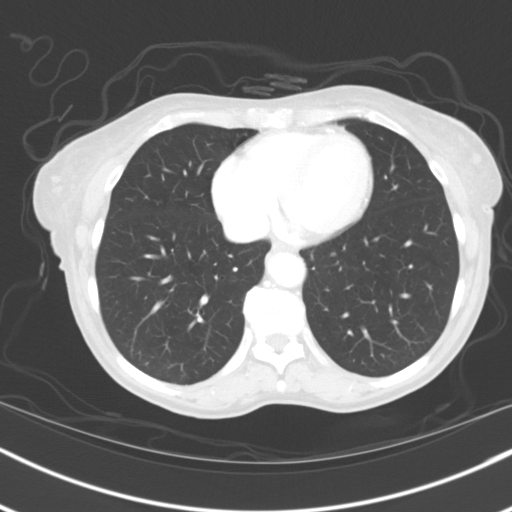
[im 90/116  mediastinal]
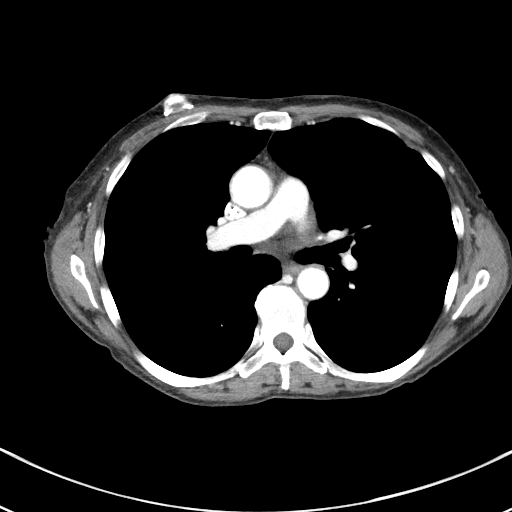
[im 90/116  lung]
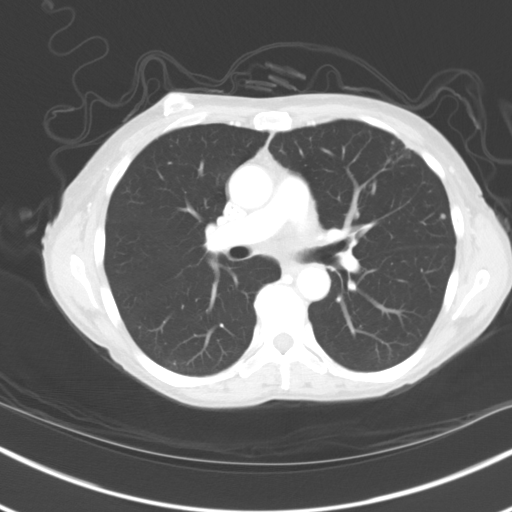
[im 103/116  mediastinal]
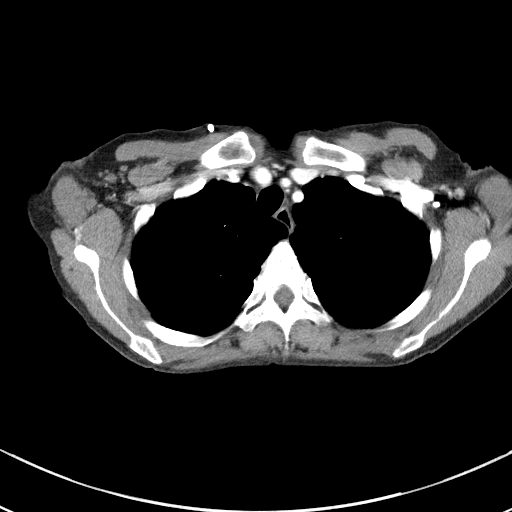
[im 103/116  lung]
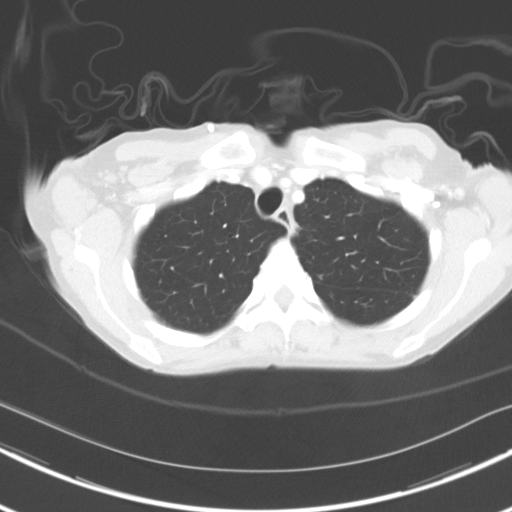

[Series 7: coronals · coronal · 0.76mm/px · 3 of 151 slices shown]
[im 31/151  mediastinal]
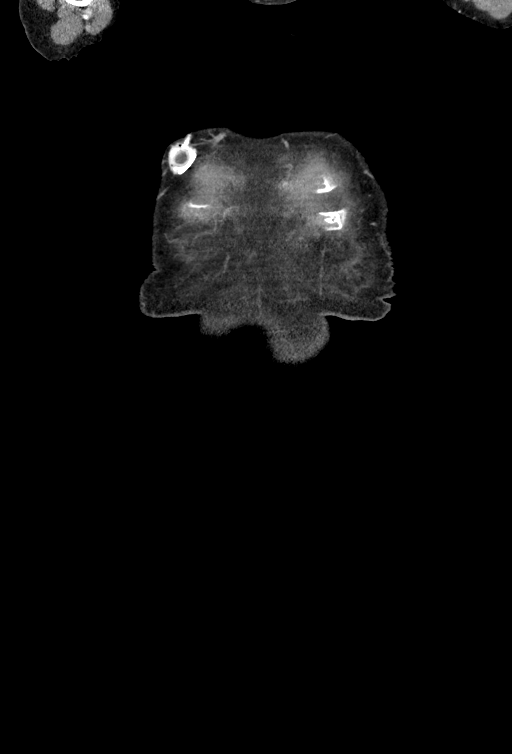
[im 61/151  mediastinal]
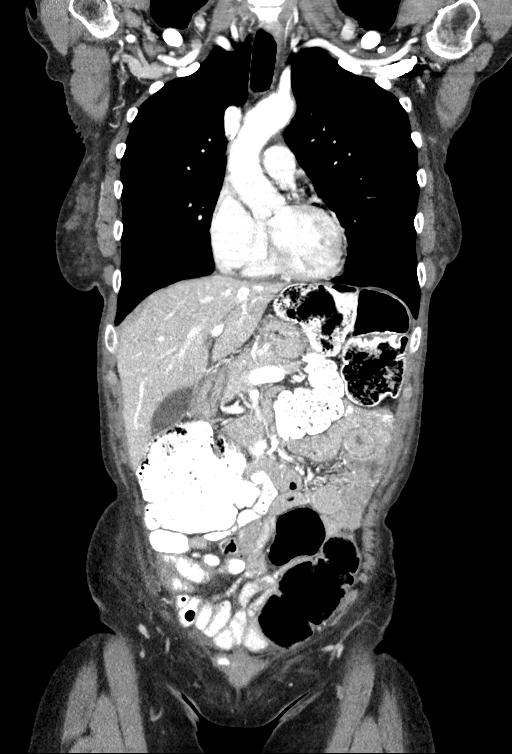
[im 91/151  mediastinal]
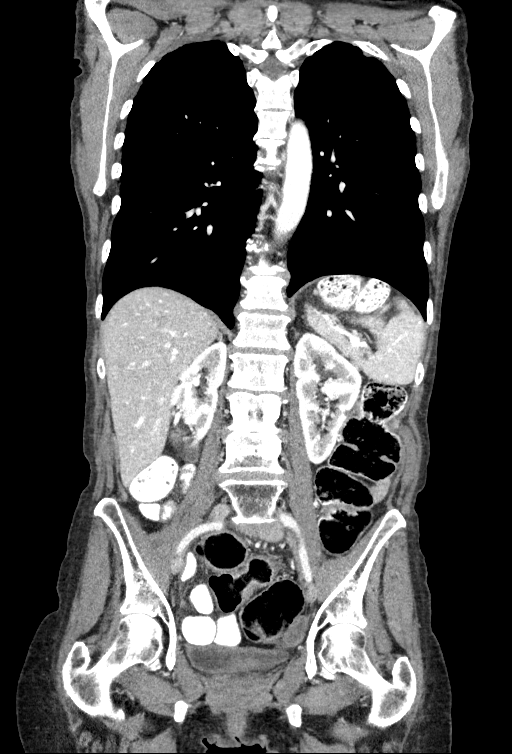

[11 of 36 positions shown; findings below may reference images not displayed]

RADIATION DOSE REDUCTION: This exam was performed according to the
departmental dose-optimization program which includes automated
exposure control, adjustment of the mA and/or kV according to
patient size and/or use of iterative reconstruction technique.

CONTRAST:  100mL OMNIPAQUE IOHEXOL 300 MG/ML SOLN additional oral
enteric contrast
FINDINGS: CT CHEST FINDINGS

Cardiovascular: Right chest port catheter. Normal heart size. No
pericardial effusion.

Mediastinum/Nodes: No enlarged mediastinal, hilar, or axillary lymph
nodes. Thyroid gland, trachea, and esophagus demonstrate no
significant findings.

Lungs/Pleura: Interval increase in a small focus of consolidation
and nodularity of the medial left upper lobe (series 6, image 40).
Additional tiny nodules of the anterior left upper lobe are
unchanged, measuring 0.4 cm and smaller (series 6, image 65).
Unchanged 0.4 cm nodule of the dependent right lower lobe (series 6,
image 77) no pleural effusion or pneumothorax.

Musculoskeletal: No chest wall mass or suspicious osseous lesions
identified.

CT ABDOMEN PELVIS FINDINGS

Hepatobiliary: A previously seen PET avid lesion of the anterior
left lobe of the liver, hepatic segment II, is no longer discretely
appreciable (series 2, image 49). No gallstones, gallbladder wall
thickening, or biliary dilatation.

Pancreas: Unremarkable. No pancreatic ductal dilatation or
surrounding inflammatory changes.

Spleen: Normal in size without significant abnormality.

Adrenals/Urinary Tract: Adrenal glands are unremarkable. Kidneys are
normal, without renal calculi, solid lesion, or hydronephrosis.
Bladder is unremarkable.

Stomach/Bowel: Stomach is within normal limits. Status post right
hemicolectomy and ileocolic anastomosis. No evidence of bowel wall
thickening, distention, or inflammatory changes.

Vascular/Lymphatic: Scattered aortic atherosclerosis. No enlarged
abdominal or pelvic lymph nodes.

Reproductive: No mass or other abnormality.

Other: No abdominal wall hernia or abnormality. No ascites.

Musculoskeletal: No acute osseous findings.
IMPRESSION: 1. A previously seen PET avid lesion of the anterior left lobe of
the liver, hepatic segment II, is no longer discretely appreciable
consistent with treatment response of a hepatic metastasis.
2. No evidence of new metastatic disease in the chest, abdomen, or
pelvis.
3. Interval increase in a small focus of consolidation and
nodularity of the medial left upper lobe, consistent with minimal,
ongoing atypical infection. Additional tiny bilateral pulmonary
nodules are stable and almost certainly incidental benign. Attention
on follow-up.
4. Status post right hemicolectomy and ileocolic anastomosis.

Aortic Atherosclerosis ([id number]).

## 2022-05-15 MED FILL — Dexamethasone Sodium Phosphate Inj 100 MG/10ML: INTRAMUSCULAR | Qty: 1 | Status: AC

## 2022-05-16 ENCOUNTER — Inpatient Hospital Stay: Payer: Federal, State, Local not specified - PPO | Admitting: Hematology

## 2022-05-16 ENCOUNTER — Encounter: Payer: Self-pay | Admitting: Hematology

## 2022-05-16 ENCOUNTER — Inpatient Hospital Stay: Payer: Federal, State, Local not specified - PPO

## 2022-05-16 ENCOUNTER — Inpatient Hospital Stay: Payer: Federal, State, Local not specified - PPO | Attending: Nurse Practitioner

## 2022-05-16 ENCOUNTER — Other Ambulatory Visit: Payer: Self-pay

## 2022-05-16 VITALS — BP 144/75 | HR 84 | Temp 98.1°F | Resp 18

## 2022-05-16 VITALS — BP 138/84 | HR 79 | Temp 98.2°F | Resp 18 | Ht 66.0 in | Wt 103.6 lb

## 2022-05-16 DIAGNOSIS — Z79899 Other long term (current) drug therapy: Secondary | ICD-10-CM | POA: Insufficient documentation

## 2022-05-16 DIAGNOSIS — T451X5D Adverse effect of antineoplastic and immunosuppressive drugs, subsequent encounter: Secondary | ICD-10-CM | POA: Insufficient documentation

## 2022-05-16 DIAGNOSIS — F419 Anxiety disorder, unspecified: Secondary | ICD-10-CM | POA: Diagnosis not present

## 2022-05-16 DIAGNOSIS — Z5112 Encounter for antineoplastic immunotherapy: Secondary | ICD-10-CM | POA: Insufficient documentation

## 2022-05-16 DIAGNOSIS — Z1509 Genetic susceptibility to other malignant neoplasm: Secondary | ICD-10-CM | POA: Diagnosis not present

## 2022-05-16 DIAGNOSIS — C20 Malignant neoplasm of rectum: Secondary | ICD-10-CM

## 2022-05-16 DIAGNOSIS — Z5189 Encounter for other specified aftercare: Secondary | ICD-10-CM | POA: Diagnosis not present

## 2022-05-16 DIAGNOSIS — Z95828 Presence of other vascular implants and grafts: Secondary | ICD-10-CM

## 2022-05-16 DIAGNOSIS — Z5111 Encounter for antineoplastic chemotherapy: Secondary | ICD-10-CM | POA: Insufficient documentation

## 2022-05-16 DIAGNOSIS — Z9221 Personal history of antineoplastic chemotherapy: Secondary | ICD-10-CM | POA: Diagnosis not present

## 2022-05-16 DIAGNOSIS — C182 Malignant neoplasm of ascending colon: Secondary | ICD-10-CM

## 2022-05-16 DIAGNOSIS — D6481 Anemia due to antineoplastic chemotherapy: Secondary | ICD-10-CM | POA: Insufficient documentation

## 2022-05-16 DIAGNOSIS — C787 Secondary malignant neoplasm of liver and intrahepatic bile duct: Secondary | ICD-10-CM | POA: Insufficient documentation

## 2022-05-16 DIAGNOSIS — I10 Essential (primary) hypertension: Secondary | ICD-10-CM | POA: Diagnosis not present

## 2022-05-16 DIAGNOSIS — Z923 Personal history of irradiation: Secondary | ICD-10-CM | POA: Diagnosis not present

## 2022-05-16 LAB — CBC WITH DIFFERENTIAL (CANCER CENTER ONLY)
Abs Immature Granulocytes: 0.01 10*3/uL (ref 0.00–0.07)
Basophils Absolute: 0 10*3/uL (ref 0.0–0.1)
Basophils Relative: 1 %
Eosinophils Absolute: 0 10*3/uL (ref 0.0–0.5)
Eosinophils Relative: 1 %
HCT: 28.8 % — ABNORMAL LOW (ref 36.0–46.0)
Hemoglobin: 8.8 g/dL — ABNORMAL LOW (ref 12.0–15.0)
Immature Granulocytes: 0 %
Lymphocytes Relative: 21 %
Lymphs Abs: 0.9 10*3/uL (ref 0.7–4.0)
MCH: 25 pg — ABNORMAL LOW (ref 26.0–34.0)
MCHC: 30.6 g/dL (ref 30.0–36.0)
MCV: 81.8 fL (ref 80.0–100.0)
Monocytes Absolute: 0.7 10*3/uL (ref 0.1–1.0)
Monocytes Relative: 16 %
Neutro Abs: 2.6 10*3/uL (ref 1.7–7.7)
Neutrophils Relative %: 61 %
Platelet Count: 224 10*3/uL (ref 150–400)
RBC: 3.52 MIL/uL — ABNORMAL LOW (ref 3.87–5.11)
RDW: 18.6 % — ABNORMAL HIGH (ref 11.5–15.5)
WBC Count: 4.2 10*3/uL (ref 4.0–10.5)
nRBC: 0 % (ref 0.0–0.2)

## 2022-05-16 LAB — TOTAL PROTEIN, URINE DIPSTICK: Protein, ur: NEGATIVE mg/dL

## 2022-05-16 LAB — CMP (CANCER CENTER ONLY)
ALT: 5 U/L (ref 0–44)
AST: 14 U/L — ABNORMAL LOW (ref 15–41)
Albumin: 3.6 g/dL (ref 3.5–5.0)
Alkaline Phosphatase: 75 U/L (ref 38–126)
Anion gap: 5 (ref 5–15)
BUN: 10 mg/dL (ref 8–23)
CO2: 28 mmol/L (ref 22–32)
Calcium: 9.1 mg/dL (ref 8.9–10.3)
Chloride: 104 mmol/L (ref 98–111)
Creatinine: 0.6 mg/dL (ref 0.44–1.00)
GFR, Estimated: >60 mL/min (ref >60)
Glucose, Bld: 78 mg/dL (ref 70–99)
Potassium: 4 mmol/L (ref 3.5–5.1)
Sodium: 137 mmol/L (ref 135–145)
Total Bilirubin: 0.3 mg/dL (ref 0.3–1.2)
Total Protein: 7 g/dL (ref 6.5–8.1)

## 2022-05-16 LAB — CEA (IN HOUSE-CHCC): CEA (CHCC-In House): 17.77 ng/mL — ABNORMAL HIGH (ref 0.00–5.00)

## 2022-05-16 MED ORDER — SODIUM CHLORIDE 0.9% FLUSH
10.0000 mL | Freq: Once | INTRAVENOUS | Status: AC
  Administered 2022-05-16: 10 mL

## 2022-05-16 MED ORDER — PALONOSETRON HCL INJECTION 0.25 MG/5ML
0.2500 mg | Freq: Once | INTRAVENOUS | Status: AC
  Administered 2022-05-16: 0.25 mg via INTRAVENOUS
  Filled 2022-05-16: qty 5

## 2022-05-16 MED ORDER — SODIUM CHLORIDE 0.9% FLUSH: 10.0000 mL | INTRAVENOUS | Status: DC | PRN

## 2022-05-16 MED ORDER — OXALIPLATIN CHEMO INJECTION 100 MG/20ML
70.0000 mg/m2 | Freq: Once | INTRAVENOUS | Status: AC
  Administered 2022-05-16: 105 mg via INTRAVENOUS
  Filled 2022-05-16: qty 10

## 2022-05-16 MED ORDER — LEUCOVORIN CALCIUM INJECTION 350 MG
400.0000 mg/m2 | Freq: Once | INTRAVENOUS | Status: AC
  Administered 2022-05-16: 600 mg via INTRAVENOUS
  Filled 2022-05-16: qty 30

## 2022-05-16 MED ORDER — DEXTROSE 5 % IV SOLN: Freq: Once | INTRAVENOUS | Status: AC

## 2022-05-16 MED ORDER — SODIUM CHLORIDE 0.9 % IV SOLN
10.0000 mg | Freq: Once | INTRAVENOUS | Status: AC
  Administered 2022-05-16: 10 mg via INTRAVENOUS
  Filled 2022-05-16: qty 10

## 2022-05-16 MED ORDER — SODIUM CHLORIDE 0.9 % IV SOLN
5.0000 mg/kg | Freq: Once | INTRAVENOUS | Status: AC
  Administered 2022-05-16: 250 mg via INTRAVENOUS
  Filled 2022-05-16: qty 10

## 2022-05-16 MED ORDER — SODIUM CHLORIDE 0.9 % IV SOLN: Freq: Once | INTRAVENOUS | Status: AC

## 2022-05-16 MED ORDER — SODIUM CHLORIDE 0.9 % IV SOLN
1800.0000 mg/m2 | INTRAVENOUS | Status: DC
  Administered 2022-05-16: 2700 mg via INTRAVENOUS
  Filled 2022-05-16: qty 54

## 2022-05-16 NOTE — Patient Instructions (Signed)
[facility]  Discharge Instructions: Thank you for choosing [facility] to provide your oncology and hematology care.   If you have a lab appointment with the [facility], please go directly to the [facility] and check in at the registration area.   Wear comfortable clothing and clothing appropriate for easy access to any Portacath or PICC line.   We strive to give you quality time with your provider. You may need to reschedule your appointment if you arrive late (15 or more minutes).  Arriving late affects you and other patients whose appointments are after yours.  Also, if you miss three or more appointments without notifying the office, you may be dismissed from the clinic at the provider's discretion.      For prescription refill requests, have your pharmacy contact our office and allow 72 hours for refills to be completed.    Today you received the following chemotherapy and/or immunotherapy agents: Bevacizumab, Oxaliplatin, Leucovorin and Fluorouracil.   To help prevent nausea and vomiting after your treatment, we encourage you to take your nausea medication as directed.  BELOW ARE SYMPTOMS THAT SHOULD BE REPORTED IMMEDIATELY: *FEVER GREATER THAN 100.4 F (38 C) OR HIGHER *CHILLS OR SWEATING *NAUSEA AND VOMITING THAT IS NOT CONTROLLED WITH YOUR NAUSEA MEDICATION *UNUSUAL SHORTNESS OF BREATH *UNUSUAL BRUISING OR BLEEDING *URINARY PROBLEMS (pain or burning when urinating, or frequent urination) *BOWEL PROBLEMS (unusual diarrhea, constipation, pain near the anus) TENDERNESS IN MOUTH AND THROAT WITH OR WITHOUT PRESENCE OF ULCERS (sore throat, sores in mouth, or a toothache) UNUSUAL RASH, SWELLING OR PAIN  UNUSUAL VAGINAL DISCHARGE OR ITCHING   Items with * indicate a potential emergency and should be followed up as soon as possible or go to the Emergency Department if any problems should occur.  Please show the CHEMOTHERAPY ALERT CARD  or IMMUNOTHERAPY ALERT CARD at check-in to the Emergency Department and triage nurse.  Should you have questions after your visit or need to cancel or reschedule your appointment, please contact [facility]  Dept: [phone]  and follow the prompts.  Office hours are 8:00 a.m. to 4:30 p.m. Monday - Friday. Please note that voicemails left after 4:00 p.m. may not be returned until the following business day.  We are closed weekends and major holidays. You have access to a nurse at all times for urgent questions. Please call the main number to the clinic Dept: [phone] and follow the prompts.   For any non-urgent questions, you may also contact your provider using MyChart. We now offer e-Visits for anyone 73 and older to request care online for non-urgent symptoms. For details visit [email or web address].   Also download the MyChart app! Go to the app store, search "MyChart", open the app, select [facility], and log in with your MyChart username and password.  Masks are optional in the cancer centers. If you would like for your care team to wear a mask while they are taking care of you, please let them know. You may have one support person who is at least 73 years old accompany you for your appointments. The chemotherapy medication bag should finish at 73 hours, 96 hours, or 7 days. For example, if your pump is scheduled for 73 hours and it was put on at 4:00 p.m., it should finish at 2:00 p.m. the day it is scheduled to come off regardless of your appointment time.     Estimated time to finish at:  If the display on your pump reads "Low Volume" and it is beeping, take the batteries out of the pump and come to the cancer center for it to be taken off.   If the pump alarms go off prior to the pump reading "Low Volume" then call [phone] and someone can assist you.  If the plunger comes out and the chemotherapy medication is leaking out, please use your  home chemo spill kit to clean up the spill. Do NOT use paper towels or other household products.  If you have problems or questions regarding your pump, please call either [phone] (24 hours a day) or the cancer center Monday-Friday 8:00 a.m.- 4:30 p.m. at the clinic number and we will assist you. If you are unable to get assistance, then go to the nearest Emergency Department and ask the staff to contact the IV team for assistance.

## 2022-05-16 NOTE — Progress Notes (Signed)
[name]   [phone] [phone]   Clinic Follow up Note   Patient Care Team: Patient, No Pcp Per as PCP - General (General Practice) [name], NP as Nurse Practitioner (Oncology) [name], MD as Consulting Physician (Oncology) [name], RN (Inactive) as Oncology Nurse Navigator  Date of Service:  05/16/2022  CHIEF COMPLAINT: f/u of metastatic rectal cancer, h/o colon cancer   CURRENT THERAPY:  FOLFOX with bevacizumab, q14d, starting 03/28/22   ASSESSMENT & PLAN:  Brittney Spencer is a 71 y.o. female with   1. Rectal adenocarcinoma, with liver and right pelvic metastasis, recurrence from previous colon cancer vs new primary   -Diagnosed by screening colonoscopy 05/2020. Staging PET 07/12/20 showed hypermetabolism to known rectal tumor, a soft tissue mass within right iliac fossa, and a left liver lesion. Pt declined liver biopsy -She previously declined referral for HIP[facility] -She began first-line chemo with dose-reduced FOLFIRI on 08/06/20, Bevacizumab added with cycle 2. She changed to maintenance therapy with irinotecan and bevacizumab every 2 weeks on 01/24/21. -restaging CT CAP 12/10/21 showed: new 1.5 cm right hepatic lobe metastasis . Her CEA had also been slowly rising, up to 11.33 on 12/13/21.  -5FU was added back to her treatment on 12/27/21. She is tolerating well at reduced dose. -restaging CT CAP 03/18/22 showed increase in size of right liver lobe metastasis, otherwise stable. Her CEA has continued to rise despite the addition of 5-FU, most recently 22.9 on 03/07/22.  -we changed to FOLFOX with continued Beva on 03/28/22. She tolerated well overall with some fatigue and cold sensitivity. -lab reviewed, hgb stable from last visit at 8.8, RDW 18.6%, otherwise stable. Urine protein negative. Adequate to proceed with treatment. -plan for restaging scan after cycle 6, in early 07/2022; I ordered today.   2. Goal of care discussion,  DNR -The patient understands the goal of care is palliative.  -she agreed to DNR/DNI on 04/25/22.   3. Cancer of ascending colon, pT4bN1bM0, stage IIIC, MSI-stable, (+) surgical margins at pelvic wall     -diagnosed in 12/2015. S/p right hemicolectomy with right salpingo oophorectomy and adjuvant ChemoRT. -Her 04/2019 CT scan was NED  -With recent rectal cancer diagnosis, her PET from 07/12/20 indicates soft tissue mass within the right iliac fossa is noted and consistent with local tumor recurrence from previous ascending colon tumor.   4. Mild Anemia -Has been stable and mild for the past year.  -worse lately due to rectal cancer and starting chemo -denies bleeding -hgb down to 8.8 now, will continue to monitor.   5. HTN, Anxiety -She uses half tab Xanax as needed, mostly only on treatment days. -continued medications and f/u per PCP -stable     PLAN: -proceed with C4 FOLFOX/beva today at same dose  -lab, flush, f/u, and FOLFOX/beva in 2 and 4 weeks -restaging CT to be done after C6   No problem-specific Assessment & Plan notes found for this encounter.   SUMMARY OF ONCOLOGIC HISTORY: Oncology History Overview Note  Cancer Staging Cancer of ascending colon [facility]) Staging form: Colon and Rectum, AJCC 7th Edition - Clinical stage from 02/07/2016: Stage IIIC (T4b, N1b, M0) - Signed by [name], MD on 03/04/2016 Laterality: Right Residual tumor (R): R2 - Macroscopic    Cancer of ascending colon ([place])  10/25/2015 Imaging   CT ABD/PELVIS:  Inflammatory changes inferior to the cecal tip appear improved, there is still irregular soft tissue thickening of the cecal tip, and there  are adjacent prominent lymph nodes in the ileocolonic mesentery, measuring 13 mm on image 49 and 8 mm on image 52. In addition, there is a 2.5 x 1.8 cm nodule on image 46 which has central low density. Therefore, these findings are moderately suspicious for an underlying cecal malignancy with perforation.     01/19/2016 Procedure   COLONOSCOPY per Dr. [name]: Fungating, ulcerated mass almost obstructing mid ascending colon   01/19/2016 Initial Biopsy   Diagnosis Surgical [P], cecal mass - INVASIVE ADENOCARCINOMA WITH ULCERATION. - SEE COMMENT.   02/05/2016 Tumor Marker   Patient's tumor was tested for the following markers: CEA Results of the tumor marker test revealed 5.7.   02/07/2016 Initial Diagnosis   Cancer of ascending colon ([place])   02/07/2016 Definitive Surgery   Laparoscopic assisted right hemicolectomy and right salpingo oopherectomy--Dr. [name]   02/07/2016 Pathologic Stage   p T4 N1b   2/43 nodes +   02/07/2016 Pathology Results   MMR normal; G2 adenocarcinoma;proximal & distal margins negative; soft tissue mass on pelvic sidewall + for adenocarcinoma with positive margin MSI Stable   03/08/2016 Imaging   CT chest negative for metastasis.    03/19/2016 - 04/25/2016 Radiation Therapy   Adjuvant irradiation, 50 gray in 28 fractions   03/19/2016 - 04/22/2016 Chemotherapy   Xeloda 1500 mg twice daily, started on 03/19/2016, dose reduced to 1000 mg twice daily from week 3 due to neutropenia, and patient stopped 3 days before last dose radiation due to difficulty swallowing the pill    05/20/2016 -  Adjuvant Chemotherapy   Patient declined adjuvant chemotherapy   09/16/2016 Imaging   CT CAP w Contrast 1. No evidence of local tumor recurrence at the ileocolic anastomosis. 2. No findings suspicious for metastatic disease in the chest, abdomen or pelvis. 3. Nonspecific trace free fluid in the pelvic cul-de-sac. 4. Stable solitary 3 mm right upper lobe pulmonary nodule, for which 6 month stability has been demonstrated, probably benign. 5. Additional findings include stable right posterior pericardial cyst and small calcified uterine fibroids.   05/13/2017 Imaging   CT CAP W Contrast 05/13/17 IMPRESSION: 1. No current findings of residual or recurrent malignancy. 2. Mild  prominence of stool throughout the colon. Nondistended portions of the rectum. 3. Several tiny pulmonary nodules are stable from the earliest available comparison of 03/08/2016 and probably benign, but may merit surveillance. 4. Other imaging findings of potential clinical significance: Old granulomatous disease. Aortoiliac atherosclerotic vascular disease. Lumbar spondylosis and degenerative disc disease. Stable amount of trace free pelvic fluid.   04/27/2018 Imaging   04/27/2018 CT CAP IMPRESSION: Stable exam. No evidence of recurrent or metastatic carcinoma within the chest, abdomen, or pelvis   04/19/2019 Imaging   CT CAP W Contrast  IMPRESSION: Chest Impression:   1. No evidence of thoracic metastasis. 2. Stable small bilateral pulmonary nodules.   Abdomen / Pelvis Impression:   1. No evidence local colorectal carcinoma recurrence or metastasis in the abdomen pelvis. 2. Post RIGHT hemicolectomy.   01/22/2021 Imaging   CT CAP  IMPRESSION: CT CHEST IMPRESSION   1. Similar nonspecific pulmonary nodules. 2. New posterior left upper lobe reticulonodular opacity, suspicious for interval mild infection or inflammation. 3. No thoracic adenopathy.   CT ABDOMEN AND PELVIS IMPRESSION   1. Further decrease in size of high left hepatic lobe 3 mm low-density lesion. No new or progressive metastatic disease within the abdomen or pelvis. 2. Similar trace free pelvic fluid. 3. Similar nonspecific mid rectal wall thickening. 4.  Aortic Atherosclerosis (ICD10-I70.0).   04/23/2021 Imaging   CT CAP  IMPRESSION: 1. Treated metastatic lesion between segments 2 and 3 of the liver, slightly smaller and less distinct than prior examination. No other signs of definite metastatic disease elsewhere in the abdomen or pelvis. 2. Multiple small pulmonary nodules, stable compared to the prior examination, favored to be benign. No definitive findings to suggest metastatic disease to the  thorax. 3. Aortic atherosclerosis. 4. Additional incidental findings, as above.   08/13/2021 Imaging   EXAM: CT CHEST, ABDOMEN, AND PELVIS WITH CONTRAST  IMPRESSION: 1. A previously seen PET avid lesion of the anterior left lobe of the liver, hepatic segment II, is no longer discretely appreciable consistent with treatment response of a hepatic metastasis. 2. No evidence of new metastatic disease in the chest, abdomen, or pelvis. 3. Interval increase in a small focus of consolidation and nodularity of the medial left upper lobe, consistent with minimal, ongoing atypical infection. Additional tiny bilateral pulmonary nodules are stable and almost certainly incidental benign. Attention on follow-up. 4. Status post right hemicolectomy and ileocolic anastomosis.   Rectal cancer metastasized to liver ([place])  06/15/2020 Procedure   Screening Colonoscopy by Dr [name]  IMPRESSION - Decreased sphincter tone and internal hemorrhoids that prolapse with straining, but require manual replacement into the anal canal (Grade III) found on digital rectal exam. - Patent side-to-side ileo-colonic anastomosis, characterized by healthy appearing mucosa. - The examined portion of the ileum was normal. - One diminutive polyp in the proximal transverse colon, removed with a cold biopsy forceps. Resected and retrieved. - Likely malignant partially obstructing tumor in the mid rectum. Biopsied. Tattooed. - The examination was otherwise normal on direct and retroflexion views.   06/15/2020 Initial Biopsy   Diagnosis 1. Transverse Colon Polyp - HYPERPLASTIC POLYP 2. Rectum, biopsy - ADENOCARCINOMA ARISING IN A TUBULAR ADENOMA WITH HIGH-GRADE DYSPLASIA. SEE NOTE Diagnosis Note 2. Dr. [name] reviewed the case and concurs with the diagnosis. Dr. [name] was notified on 06/16/2020.   06/28/2020 Imaging   CT CAP  IMPRESSION: 1. New low-density focus in the anterior aspect of the lateral segment LEFT hepatic  lobe measuring 1.2 x 1.0 cm, compatible with small metastatic lesion in the LEFT hepatic lobe. 2. Soft tissue in the RIGHT iliac fossa following RIGHT hemicolectomy invades the psoas musculature and is slowly enlarging over time, more linear on the prior study now highly concerning for recurrence/metastasis to this location. 3. Signs of enteritis, potentially post radiation changes of the small bowel. Tethered small bowel in the RIGHT lower quadrant shows focal thickening and narrowing suspicious for small bowel involvement and developing partial obstruction though currently contrast passes beyond this point into the colon. 4. Rectal thickening in this patient with known rectal mass as described. 5. No evidence of metastatic disease in the chest. 6. Stable small pulmonary nodules. 7.  and aortic atherosclerosis.   Aortic Atherosclerosis (ICD10-I70.0) and Emphysema (ICD10-J43.9).   07/04/2020 Initial Diagnosis   Rectal cancer metastasized to liver ([place])   07/12/2020 PET scan   IMPRESSION: 1. Exam positive for FDG avid rectal tumor which corresponds to the recent colonoscopy findings. 2. FDG avid soft tissue mass within the right iliac fossa is noted and consistent with local tumor recurrence from previous ascending colon tumor. 3. Lateral segment left lobe of liver lesion is FDG avid concerning for liver metastasis. 4. No specific findings identified to suggest metastatic disease to the chest.   08/10/2020 -  Chemotherapy   First-line FOLFIRI q2weeks starting  08/10/20. dose reduced with cycle 1. Irinotecan/5FU increased and Bevacizumab added with cycle 2 on 08/23/2020    08/17/2020 - 03/09/2022 Chemotherapy   Patient is on Treatment Plan : COLORECTAL FOLFIRI + Bevacizumab q14d     10/27/2020 Imaging   CT CAP  IMPRESSION: 1. Interval decrease in size of the hypermetabolic left hepatic lesion, consistent with metastatic disease. No new liver lesion evident. 2. Interval resolution of  the hypermetabolic soft tissue lesion along the right iliac fossa with no measurable soft tissue lesion remaining at this location today. 3. Similar appearance of soft tissue fullness in the rectum at the site of the hypermetabolic lesion seen previously. 4. Stable tiny bilateral pulmonary nodules. Continued attention on follow-up recommended. 5. Small volume free fluid in the pelvis. 6. Aortic Atherosclerosis (ICD10-I70.0).   01/22/2021 Imaging   CT CAP  IMPRESSION: CT CHEST IMPRESSION   1. Similar nonspecific pulmonary nodules. 2. New posterior left upper lobe reticulonodular opacity, suspicious for interval mild infection or inflammation. 3. No thoracic adenopathy.   CT ABDOMEN AND PELVIS IMPRESSION   1. Further decrease in size of high left hepatic lobe 3 mm low-density lesion. No new or progressive metastatic disease within the abdomen or pelvis. 2. Similar trace free pelvic fluid. 3. Similar nonspecific mid rectal wall thickening. 4.  Aortic Atherosclerosis (ICD10-I70.0).   04/23/2021 Imaging   CT CAP  IMPRESSION: 1. Treated metastatic lesion between segments 2 and 3 of the liver, slightly smaller and less distinct than prior examination. No other signs of definite metastatic disease elsewhere in the abdomen or pelvis. 2. Multiple small pulmonary nodules, stable compared to the prior examination, favored to be benign. No definitive findings to suggest metastatic disease to the thorax. 3. Aortic atherosclerosis. 4. Additional incidental findings, as above.   08/13/2021 Imaging   EXAM: CT CHEST, ABDOMEN, AND PELVIS WITH CONTRAST  IMPRESSION: 1. A previously seen PET avid lesion of the anterior left lobe of the liver, hepatic segment II, is no longer discretely appreciable consistent with treatment response of a hepatic metastasis. 2. No evidence of new metastatic disease in the chest, abdomen, or pelvis. 3. Interval increase in a small focus of consolidation  and nodularity of the medial left upper lobe, consistent with minimal, ongoing atypical infection. Additional tiny bilateral pulmonary nodules are stable and almost certainly incidental benign. Attention on follow-up. 4. Status post right hemicolectomy and ileocolic anastomosis.   03/28/2022 -  Chemotherapy   Patient is on Treatment Plan : COLORECTAL FOLFOX + Bevacizumab q14d        INTERVAL HISTORY:  Brittney Spencer is here for a follow up of metastatic rectal cancer. She was last seen by me on 04/25/22. She presents to the clinic alone. She reports she is doing well overall, no new concerns.  All other systems were reviewed with the patient and are negative.  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 Some residual intraretinal fluid remains, very small localized subfoveal of fluid remains although this continues to slowly resorb. We'll continue to observe.   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   Resolved March 2021 post vitrectomy    SURGICAL HISTORY: Past Surgical History:  Procedure Laterality Date   COLONOSCOPY  2018   [facility]   COLONSCOPY  12/2015   IR IMAGING GUIDED PORT INSERTION  08/04/2020   LAPAROSCOPIC RIGHT HEMI COLECTOMY Right 02/07/2016   Procedure: LAPAROSCOPIC ASSISTED RIGHT HEMI COLECTOMY AND RIGHT SALPINGO OOPHERECTOMY;  Surgeon: [name], MD;  Location: [facility];   Service: General;  Laterality: Right;   RETINAL DETACHMENT SURGERY  09/2019    I have reviewed the social history and family history with the patient and they are unchanged from previous note.  ALLERGIES:  is allergic to fish allergy, peanut-containing drug products, soy allergy, and buspirone.  MEDICATIONS:  Current Outpatient Medications  Medication Sig Dispense Refill   ALPRAZolam (XANAX) 0.25 MG tablet Take 1 tablet (0.25 mg total) by mouth daily as needed for anxiety. 30 tablet 0   Cholecalciferol (VITAMIN D3 PO) Take by mouth daily.     docusate sodium (COLACE) 100 MG capsule 1 capsule as needed     lidocaine-prilocaine (EMLA) cream Apply 1 application topically as needed. 30 g 1   NORVASC 2.5 MG tablet Take 1 tablet (2.5 mg total) by mouth daily. 90 tablet 1   ondansetron (ZOFRAN) 8 MG tablet Take 1 tablet (8 mg total) by mouth every 8 (eight) hours as needed for nausea or vomiting. 20 tablet 2   prochlorperazine (COMPAZINE) 10 MG tablet Take 1 tablet (10 mg total) by mouth every 6 (six) hours as needed for nausea or vomiting. 30 tablet 2   Current Facility-Administered Medications  Medication Dose Route Frequency Provider Last Rate Last Admin   0.9 %  sodium chloride infusion  500 mL Intravenous Once Danis, Estill Cotta III, MD        PHYSICAL EXAMINATION: ECOG PERFORMANCE STATUS: 1 - Symptomatic but completely ambulatory  Vitals:   05/16/22 0948  BP: 138/84  Pulse: 79  Resp: 18  Temp: 98.2 F (36.8 C)  SpO2: 100%   Wt Readings from Last 3 Encounters:  05/16/22 103 lb 9.6 oz (47 kg)  04/27/22 105 lb (47.6 kg)  04/25/22 105 lb 1.6 oz (47.7 kg)     GENERAL:alert, no distress and comfortable SKIN: skin color normal, no rashes or significant lesions EYES: normal, Conjunctiva are pink and non-injected, sclera clear  NEURO: alert & oriented x 3 with fluent speech  LABORATORY DATA:  I have reviewed the data as listed    Latest Ref Rng & Units 05/16/2022    9:25 AM  04/25/2022    8:24 AM 04/11/2022    9:03 AM  CBC  WBC 4.0 - 10.5 K/uL 4.2  6.6  7.5   Hemoglobin 12.0 - 15.0 g/dL 8.8  8.8  9.3   Hematocrit 36.0 - 46.0 % 28.8  28.8  30.6   Platelets 150 - 400 K/uL 224  193  217         Latest Ref Rng & Units 04/25/2022    8:24 AM 04/11/2022    9:03 AM 03/28/2022    9:39 AM  CMP  Glucose 70 - 99 mg/dL 80  86  91   BUN 8 - 23 mg/dL 11  10  10  Creatinine 0.44 - 1.00 mg/dL 0.63  0.62  0.53   Sodium 135 - 145 mmol/L 137  137  138   Potassium 3.5 - 5.1 mmol/L 4.0  4.1  3.7   Chloride 98 - 111 mmol/L 105  105  103   CO2 22 - 32 mmol/L _0 Calcium 8.9 - 10.3 mg/dL 8.7  8.8  8.6   Total Protein 6.5 - 8.1 g/dL 6.4  6.8  6.8   Total Bilirubin 0.3 - 1.2 mg/dL 0.2  0.3  0.3   Alkaline Phos 38 - 126 U/L 87  90  78   AST 15 - 41 U/L _1 ALT 0 - 44 U/L _2 RADIOGRAPHIC STUDIES: I have personally reviewed the radiological images as listed and agreed with the findings in the report. No results found.    Orders Placed This Encounter  Procedures   CT CHEST ABDOMEN PELVIS W CONTRAST    Standing Status:   Future    Standing Expiration Date:   05/17/2023    Order Specific Question:   Preferred imaging location?    Answer:   [facility]    Order Specific Question:   Release to patient    Answer:   Immediate    Order Specific Question:   Is Oral Contrast requested for this exam?    Answer:   Yes, Per Radiology protocol   All questions were answered. The patient knows to call the clinic with any problems, questions or concerns. No barriers to learning was detected. The total time spent in the appointment was 30 minutes.     [name], MD 05/16/2022   I, Wilburn Mylar, am acting as scribe for [name], MD.   I have reviewed the above documentation for accuracy and completeness, and I agree with the above.

## 2022-05-18 ENCOUNTER — Inpatient Hospital Stay: Payer: Federal, State, Local not specified - PPO

## 2022-05-18 ENCOUNTER — Other Ambulatory Visit: Payer: Self-pay

## 2022-05-18 VITALS — BP 150/80 | HR 102 | Temp 98.4°F | Resp 16

## 2022-05-18 DIAGNOSIS — C20 Malignant neoplasm of rectum: Secondary | ICD-10-CM

## 2022-05-18 DIAGNOSIS — Z5112 Encounter for antineoplastic immunotherapy: Secondary | ICD-10-CM | POA: Diagnosis not present

## 2022-05-18 MED ORDER — PEGFILGRASTIM-JMDB 6 MG/0.6ML ~~LOC~~ SOSY
6.0000 mg | PREFILLED_SYRINGE | Freq: Once | SUBCUTANEOUS | Status: AC
  Administered 2022-05-18: 6 mg via SUBCUTANEOUS

## 2022-05-18 MED ORDER — SODIUM CHLORIDE 0.9% FLUSH
10.0000 mL | INTRAVENOUS | Status: DC | PRN
  Administered 2022-05-18: 10 mL

## 2022-05-18 MED ORDER — HEPARIN SOD (PORK) LOCK FLUSH 100 UNIT/ML IV SOLN
500.0000 [IU] | Freq: Once | INTRAVENOUS | Status: AC | PRN
  Administered 2022-05-18: 500 [IU]

## 2022-05-27 NOTE — Addendum Note (Signed)
Addended by: [name] on: 05/27/2022 02:20 PM   Modules accepted: Orders

## 2022-05-29 ENCOUNTER — Other Ambulatory Visit: Payer: Self-pay

## 2022-05-29 MED FILL — Dexamethasone Sodium Phosphate Inj 100 MG/10ML: INTRAMUSCULAR | Qty: 1 | Status: AC

## 2022-05-29 NOTE — Progress Notes (Unsigned)
[place]   [phone] [phone]   Clinic Follow up Note   Patient Care Team: Patient, No Pcp Per as PCP - General (General Practice) [name], NP as Nurse Practitioner (Oncology) [name], MD as Consulting Physician (Oncology) [name], RN (Inactive) as Oncology Nurse Navigator  Date of Service:  05/30/2022  CHIEF COMPLAINT: f/u of metastatic rectal cancer, h/o colon cancer    CURRENT THERAPY: FOLFOX with bevacizumab, q14d, starting 03/28/22     ASSESSMENT:  Brittney Spencer is a 71 y.o. female with   1. Rectal adenocarcinoma, with liver and right pelvic metastasis, recurrence from previous colon cancer vs new primary   -Diagnosed by screening colonoscopy 05/2020. Staging PET 07/12/20 showed hypermetabolism to known rectal tumor, a soft tissue mass within right iliac fossa, and a left liver lesion. Pt declined liver biopsy -She previously declined referral for HIPEC -She began first-line chemo with dose-reduced FOLFIRI on 08/06/20, Bevacizumab added with cycle 2. She changed to maintenance therapy with irinotecan and bevacizumab every 2 weeks on 01/24/21. -restaging CT CAP 12/10/21 showed: new 1.5 cm right hepatic lobe metastasis . Her CEA had also been slowly rising, up to 11.33 on 12/13/21.  -5FU was added back to her treatment on 12/27/21. She is tolerating well at reduced dose. -restaging CT CAP 03/18/22 showed increase in size of right liver lobe metastasis, otherwise stable. Her CEA has continued to rise despite the addition of 5-FU, most recently 22.9 on 03/07/22.  -we changed to FOLFOX with continued Beva on 03/28/22. She tolerated well overall with some fatigue and cold sensitivity. -lab reviewed, hgb stable from last visit at 8.8, RDW 18.6%, otherwise stable. Urine protein negative. Adequate to proceed with treatment. -plan for restaging scan after cycle 6, in early 07/2022; she will call to schedule   2. Goal of care  discussion, DNR -The patient understands the goal of care is palliative.  -she agreed to DNR/DNI on 04/25/22.   3. Cancer of ascending colon, pT4bN1bM0, stage IIIC, MSI-stable, (+) surgical margins at pelvic wall     -diagnosed in 12/2015. S/p right hemicolectomy with right salpingo oophorectomy and adjuvant ChemoRT. -Her 04/2019 CT scan was NED  -With recent rectal cancer diagnosis, her PET from 07/12/20 indicates soft tissue mass within the right iliac fossa is noted and consistent with local tumor recurrence from previous ascending colon tumor.   4. Mild Anemia -Has been stable and mild for the past year.  -worse lately due to rectal cancer and starting chemo -denies bleeding -hgb down to 8.8 now, will continue to monitor.   5. HTN, Anxiety -She uses half tab Xanax as needed, mostly only on treatment days. -continued medications and f/u per PCP -stable   6.  Moderate malnutrition -Her weight is low, I again discussed nutrition supplement, and encouraged her to eat more.  PLAN: -proceed with C4 FOLFOX/beva today at same dose  --return in 2wks,due to the holiday will reduce next cycle dose Day1 Cycle 6 on 12/14, orders adjusted    SUMMARY OF ONCOLOGIC HISTORY: Oncology History Overview Note  Cancer Staging Cancer of ascending colon [facility]) Staging form: Colon and Rectum, AJCC 7th Edition - Clinical stage from 02/07/2016: Stage IIIC (T4b, N1b, M0) - Signed by [name], MD on 03/04/2016 Laterality: Right Residual tumor (R): R2 - Macroscopic    Cancer of ascending colon ([place])  10/25/2015 Imaging   CT ABD/PELVIS:  Inflammatory changes inferior to the cecal tip appear improved, there is still  irregular soft tissue thickening of the cecal tip, and there are adjacent prominent lymph nodes in the ileocolonic mesentery, measuring 13 mm on image 49 and 8 mm on image 52. In addition, there is a 2.5 x 1.8 cm nodule on image 46 which has central low density. Therefore, these findings  are moderately suspicious for an underlying cecal malignancy with perforation.    01/19/2016 Procedure   COLONOSCOPY per Dr. [name]: Fungating, ulcerated mass almost obstructing mid ascending colon   01/19/2016 Initial Biopsy   Diagnosis Surgical [P], cecal mass - INVASIVE ADENOCARCINOMA WITH ULCERATION. - SEE COMMENT.   02/05/2016 Tumor Marker   Patient's tumor was tested for the following markers: CEA Results of the tumor marker test revealed 5.7.   02/07/2016 Initial Diagnosis   Cancer of ascending colon ([place])   02/07/2016 Definitive Surgery   Laparoscopic assisted right hemicolectomy and right salpingo oopherectomy--Dr. [name]   02/07/2016 Pathologic Stage   p T4 N1b   2/43 nodes +   02/07/2016 Pathology Results   MMR normal; G2 adenocarcinoma;proximal & distal margins negative; soft tissue mass on pelvic sidewall + for adenocarcinoma with positive margin MSI Stable   03/08/2016 Imaging   CT chest negative for metastasis.    03/19/2016 - 04/25/2016 Radiation Therapy   Adjuvant irradiation, 50 gray in 28 fractions   03/19/2016 - 04/22/2016 Chemotherapy   Xeloda 1500 mg twice daily, started on 03/19/2016, dose reduced to 1000 mg twice daily from week 3 due to neutropenia, and patient stopped 3 days before last dose radiation due to difficulty swallowing the pill    05/20/2016 -  Adjuvant Chemotherapy   Patient declined adjuvant chemotherapy   09/16/2016 Imaging   CT CAP w Contrast 1. No evidence of local tumor recurrence at the ileocolic anastomosis. 2. No findings suspicious for metastatic disease in the chest, abdomen or pelvis. 3. Nonspecific trace free fluid in the pelvic cul-de-sac. 4. Stable solitary 3 mm right upper lobe pulmonary nodule, for which 6 month stability has been demonstrated, probably benign. 5. Additional findings include stable right posterior pericardial cyst and small calcified uterine fibroids.   05/13/2017 Imaging   CT CAP W Contrast  05/13/17 IMPRESSION: 1. No current findings of residual or recurrent malignancy. 2. Mild prominence of stool throughout the colon. Nondistended portions of the rectum. 3. Several tiny pulmonary nodules are stable from the earliest available comparison of 03/08/2016 and probably benign, but may merit surveillance. 4. Other imaging findings of potential clinical significance: Old granulomatous disease. Aortoiliac atherosclerotic vascular disease. Lumbar spondylosis and degenerative disc disease. Stable amount of trace free pelvic fluid.   04/27/2018 Imaging   04/27/2018 CT CAP IMPRESSION: Stable exam. No evidence of recurrent or metastatic carcinoma within the chest, abdomen, or pelvis   04/19/2019 Imaging   CT CAP W Contrast  IMPRESSION: Chest Impression:   1. No evidence of thoracic metastasis. 2. Stable small bilateral pulmonary nodules.   Abdomen / Pelvis Impression:   1. No evidence local colorectal carcinoma recurrence or metastasis in the abdomen pelvis. 2. Post RIGHT hemicolectomy.   01/22/2021 Imaging   CT CAP  IMPRESSION: CT CHEST IMPRESSION   1. Similar nonspecific pulmonary nodules. 2. New posterior left upper lobe reticulonodular opacity, suspicious for interval mild infection or inflammation. 3. No thoracic adenopathy.   CT ABDOMEN AND PELVIS IMPRESSION   1. Further decrease in size of high left hepatic lobe 3 mm low-density lesion. No new or progressive metastatic disease within the abdomen or pelvis. 2. Similar trace free  pelvic fluid. 3. Similar nonspecific mid rectal wall thickening. 4.  Aortic Atherosclerosis (ICD10-I70.0).   04/23/2021 Imaging   CT CAP  IMPRESSION: 1. Treated metastatic lesion between segments 2 and 3 of the liver, slightly smaller and less distinct than prior examination. No other signs of definite metastatic disease elsewhere in the abdomen or pelvis. 2. Multiple small pulmonary nodules, stable compared to the  prior examination, favored to be benign. No definitive findings to suggest metastatic disease to the thorax. 3. Aortic atherosclerosis. 4. Additional incidental findings, as above.   08/13/2021 Imaging   EXAM: CT CHEST, ABDOMEN, AND PELVIS WITH CONTRAST  IMPRESSION: 1. A previously seen PET avid lesion of the anterior left lobe of the liver, hepatic segment II, is no longer discretely appreciable consistent with treatment response of a hepatic metastasis. 2. No evidence of new metastatic disease in the chest, abdomen, or pelvis. 3. Interval increase in a small focus of consolidation and nodularity of the medial left upper lobe, consistent with minimal, ongoing atypical infection. Additional tiny bilateral pulmonary nodules are stable and almost certainly incidental benign. Attention on follow-up. 4. Status post right hemicolectomy and ileocolic anastomosis.   Rectal cancer metastasized to liver ([place])  06/15/2020 Procedure   Screening Colonoscopy by Dr [name]  IMPRESSION - Decreased sphincter tone and internal hemorrhoids that prolapse with straining, but require manual replacement into the anal canal (Grade III) found on digital rectal exam. - Patent side-to-side ileo-colonic anastomosis, characterized by healthy appearing mucosa. - The examined portion of the ileum was normal. - One diminutive polyp in the proximal transverse colon, removed with a cold biopsy forceps. Resected and retrieved. - Likely malignant partially obstructing tumor in the mid rectum. Biopsied. Tattooed. - The examination was otherwise normal on direct and retroflexion views.   06/15/2020 Initial Biopsy   Diagnosis 1. Transverse Colon Polyp - HYPERPLASTIC POLYP 2. Rectum, biopsy - ADENOCARCINOMA ARISING IN A TUBULAR ADENOMA WITH HIGH-GRADE DYSPLASIA. SEE NOTE Diagnosis Note 2. Dr. [name] reviewed the case and concurs with the diagnosis. Dr. [name] was notified on 06/16/2020.   06/28/2020 Imaging   CT  CAP  IMPRESSION: 1. New low-density focus in the anterior aspect of the lateral segment LEFT hepatic lobe measuring 1.2 x 1.0 cm, compatible with small metastatic lesion in the LEFT hepatic lobe. 2. Soft tissue in the RIGHT iliac fossa following RIGHT hemicolectomy invades the psoas musculature and is slowly enlarging over time, more linear on the prior study now highly concerning for recurrence/metastasis to this location. 3. Signs of enteritis, potentially post radiation changes of the small bowel. Tethered small bowel in the RIGHT lower quadrant shows focal thickening and narrowing suspicious for small bowel involvement and developing partial obstruction though currently contrast passes beyond this point into the colon. 4. Rectal thickening in this patient with known rectal mass as described. 5. No evidence of metastatic disease in the chest. 6. Stable small pulmonary nodules. 7.  and aortic atherosclerosis.   Aortic Atherosclerosis (ICD10-I70.0) and Emphysema (ICD10-J43.9).   07/04/2020 Initial Diagnosis   Rectal cancer metastasized to liver ([place])   07/12/2020 PET scan   IMPRESSION: 1. Exam positive for FDG avid rectal tumor which corresponds to the recent colonoscopy findings. 2. FDG avid soft tissue mass within the right iliac fossa is noted and consistent with local tumor recurrence from previous ascending colon tumor. 3. Lateral segment left lobe of liver lesion is FDG avid concerning for liver metastasis. 4. No specific findings identified to suggest metastatic disease to the chest.  08/10/2020 -  Chemotherapy   First-line FOLFIRI q2weeks starting 08/10/20. dose reduced with cycle 1. Irinotecan/5FU increased and Bevacizumab added with cycle 2 on 08/23/2020    08/17/2020 - 03/09/2022 Chemotherapy   Patient is on Treatment Plan : COLORECTAL FOLFIRI + Bevacizumab q14d     10/27/2020 Imaging   CT CAP  IMPRESSION: 1. Interval decrease in size of the hypermetabolic left  hepatic lesion, consistent with metastatic disease. No new liver lesion evident. 2. Interval resolution of the hypermetabolic soft tissue lesion along the right iliac fossa with no measurable soft tissue lesion remaining at this location today. 3. Similar appearance of soft tissue fullness in the rectum at the site of the hypermetabolic lesion seen previously. 4. Stable tiny bilateral pulmonary nodules. Continued attention on follow-up recommended. 5. Small volume free fluid in the pelvis. 6. Aortic Atherosclerosis (ICD10-I70.0).   01/22/2021 Imaging   CT CAP  IMPRESSION: CT CHEST IMPRESSION   1. Similar nonspecific pulmonary nodules. 2. New posterior left upper lobe reticulonodular opacity, suspicious for interval mild infection or inflammation. 3. No thoracic adenopathy.   CT ABDOMEN AND PELVIS IMPRESSION   1. Further decrease in size of high left hepatic lobe 3 mm low-density lesion. No new or progressive metastatic disease within the abdomen or pelvis. 2. Similar trace free pelvic fluid. 3. Similar nonspecific mid rectal wall thickening. 4.  Aortic Atherosclerosis (ICD10-I70.0).   04/23/2021 Imaging   CT CAP  IMPRESSION: 1. Treated metastatic lesion between segments 2 and 3 of the liver, slightly smaller and less distinct than prior examination. No other signs of definite metastatic disease elsewhere in the abdomen or pelvis. 2. Multiple small pulmonary nodules, stable compared to the prior examination, favored to be benign. No definitive findings to suggest metastatic disease to the thorax. 3. Aortic atherosclerosis. 4. Additional incidental findings, as above.   08/13/2021 Imaging   EXAM: CT CHEST, ABDOMEN, AND PELVIS WITH CONTRAST  IMPRESSION: 1. A previously seen PET avid lesion of the anterior left lobe of the liver, hepatic segment II, is no longer discretely appreciable consistent with treatment response of a hepatic metastasis. 2. No evidence of new  metastatic disease in the chest, abdomen, or pelvis. 3. Interval increase in a small focus of consolidation and nodularity of the medial left upper lobe, consistent with minimal, ongoing atypical infection. Additional tiny bilateral pulmonary nodules are stable and almost certainly incidental benign. Attention on follow-up. 4. Status post right hemicolectomy and ileocolic anastomosis.   03/28/2022 -  Chemotherapy   Patient is on Treatment Plan : COLORECTAL FOLFOX + Bevacizumab q14d        INTERVAL HISTORY:  Brittney Spencer is here for a follow up of  metastatic rectal cancer, h/o colon cancer  She was last seen by me on 05/16/2022 She presents to the clinic alone. Pt reports doing well.Pt states of having little appetite,but continues to eat. Pt reports no pain, very active in the community. Pt has no other concerns.     All other systems were reviewed with the patient and are negative.  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 Some residual intraretinal fluid remains, very small localized subfoveal of fluid remains although this continues to slowly resorb. We'll continue to observe.   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   Resolved March 2021 post vitrectomy    SURGICAL HISTORY: Past Surgical History:  Procedure Laterality Date   COLONOSCOPY  2018   [facility]   COLONSCOPY  12/2015   IR  IMAGING GUIDED PORT INSERTION  08/04/2020   LAPAROSCOPIC RIGHT HEMI COLECTOMY Right 02/07/2016   Procedure: LAPAROSCOPIC ASSISTED RIGHT HEMI COLECTOMY AND RIGHT SALPINGO OOPHERECTOMY;  Surgeon: [name], MD;  Location: [facility];  Service: General;  Laterality: Right;   RETINAL DETACHMENT SURGERY  09/2019    I have reviewed the social history and family history with the patient and they are unchanged from previous note.  ALLERGIES:  is allergic to fish allergy, peanut-containing drug products, soy allergy, and buspirone.  MEDICATIONS:  Current Outpatient Medications  Medication Sig Dispense Refill   ALPRAZolam (XANAX) 0.25 MG tablet Take 1 tablet (0.25 mg total) by mouth daily as needed for anxiety. 30 tablet 0   Cholecalciferol (VITAMIN D3 PO) Take by mouth daily.     docusate sodium (COLACE) 100 MG capsule 1 capsule as needed     lidocaine-prilocaine (EMLA) cream Apply 1 application topically as needed. 30 g 1   NORVASC 2.5 MG tablet Take 1 tablet (2.5 mg total) by mouth daily. 90 tablet 1   ondansetron (ZOFRAN) 8 MG tablet Take 1 tablet (8 mg total) by mouth every 8 (eight) hours as needed for nausea or vomiting. 20 tablet 2   prochlorperazine (COMPAZINE) 10 MG tablet Take 1 tablet (10 mg total) by mouth every 6 (six) hours as needed for nausea or vomiting. 30 tablet 2   Current Facility-Administered Medications  Medication Dose Route Frequency Provider Last Rate Last Admin   0.9 %  sodium chloride infusion  500 mL Intravenous Once Danis, Estill Cotta III, MD        PHYSICAL EXAMINATION: ECOG PERFORMANCE STATUS: 1 - Symptomatic but completely ambulatory  Vitals:   05/30/22 0805  BP: 132/89  Pulse: 87  Resp: 16  Temp: 98.2 F (36.8 C)  SpO2: 100%   Wt Readings from Last 3 Encounters:  05/30/22 101 lb 1.6 oz (45.9 kg)  05/16/22 103 lb 9.6 oz (47 kg)  04/27/22 105 lb (47.6 kg)    GENERAL:alert, no distress and comfortable SKIN: skin color, texture, turgor are normal, no  rashes or significant lesions EYES: normal, Conjunctiva are pink and non-injected, sclera clear  NECK: supple, thyroid normal size, non-tender, without nodularity LYMPH:  no palpable lymphadenopathy in the cervical, axillary negative finding  LUNGS: clear to auscultation and percussion with normal breathing effort -negative HEART: regular rate & rhythm and no murmurs and no lower extremity edema -negative ABDOMEN:abdomen soft, non-tender and normal bowel sounds Musculoskeletal:no cyanosis of digits and no clubbing -negative NEURO: alert & oriented x 3 with fluent speech, no focal motor/sensory deficits  LABORATORY DATA:  I have reviewed the data as listed    Latest Ref Rng & Units 05/30/2022    7:40 AM 05/16/2022    9:25 AM 04/25/2022    8:24 AM  CBC  WBC 4.0 - 10.5 K/uL  7.6  4.2  6.6   Hemoglobin 12.0 - 15.0 g/dL 9.0  8.8  8.8   Hematocrit 36.0 - 46.0 % 29.7  28.8  28.8   Platelets 150 - 400 K/uL 163  224  193         Latest Ref Rng & Units 05/30/2022    7:40 AM 05/16/2022    9:25 AM 04/25/2022    8:24 AM  CMP  Glucose 70 - 99 mg/dL 83  78  80   BUN 8 - 23 mg/dL _0 Creatinine 0.44 - 1.00 mg/dL 0.59  0.60  0.63   Sodium 135 - 145 mmol/L 137  137  137   Potassium 3.5 - 5.1 mmol/L 4.0  4.0  4.0   Chloride 98 - 111 mmol/L 106  104  105   CO2 22 - 32 mmol/L _1 Calcium 8.9 - 10.3 mg/dL 8.9  9.1  8.7   Total Protein 6.5 - 8.1 g/dL 6.8  7.0  6.4   Total Bilirubin 0.3 - 1.2 mg/dL 0.3  0.3  0.2   Alkaline Phos 38 - 126 U/L 88  75  87   AST 15 - 41 U/L _2 ALT 0 - 44 U/L _3 RADIOGRAPHIC STUDIES: I have personally reviewed the radiological images as listed and agreed with the findings in the report. No results found.    No orders of the defined types were placed in this encounter.  All questions were answered. The patient knows to call the clinic with any problems, questions or concerns. No barriers to learning was detected. The  total time spent in the appointment was 30 minutes.     [name], MD 05/30/2022   I, Audry Riles, CMA, am acting as scribe for [name], MD.   I have reviewed the above documentation for accuracy and completeness, and I agree with the above.

## 2022-05-30 ENCOUNTER — Inpatient Hospital Stay (HOSPITAL_BASED_OUTPATIENT_CLINIC_OR_DEPARTMENT_OTHER): Payer: Federal, State, Local not specified - PPO | Admitting: Hematology

## 2022-05-30 ENCOUNTER — Other Ambulatory Visit: Payer: Self-pay

## 2022-05-30 ENCOUNTER — Encounter: Payer: Self-pay | Admitting: Hematology

## 2022-05-30 ENCOUNTER — Inpatient Hospital Stay: Payer: Federal, State, Local not specified - PPO

## 2022-05-30 VITALS — BP 132/89 | HR 87 | Temp 98.2°F | Resp 16 | Ht 66.0 in | Wt 101.1 lb

## 2022-05-30 VITALS — BP 140/69 | HR 70 | Temp 98.4°F | Resp 18

## 2022-05-30 DIAGNOSIS — Z95828 Presence of other vascular implants and grafts: Secondary | ICD-10-CM

## 2022-05-30 DIAGNOSIS — C787 Secondary malignant neoplasm of liver and intrahepatic bile duct: Secondary | ICD-10-CM

## 2022-05-30 DIAGNOSIS — C20 Malignant neoplasm of rectum: Secondary | ICD-10-CM

## 2022-05-30 DIAGNOSIS — Z5112 Encounter for antineoplastic immunotherapy: Secondary | ICD-10-CM | POA: Diagnosis not present

## 2022-05-30 DIAGNOSIS — C182 Malignant neoplasm of ascending colon: Secondary | ICD-10-CM

## 2022-05-30 LAB — CBC WITH DIFFERENTIAL (CANCER CENTER ONLY)
Abs Immature Granulocytes: 0.04 10*3/uL (ref 0.00–0.07)
Basophils Absolute: 0 10*3/uL (ref 0.0–0.1)
Basophils Relative: 0 %
Eosinophils Absolute: 0.1 10*3/uL (ref 0.0–0.5)
Eosinophils Relative: 1 %
HCT: 29.7 % — ABNORMAL LOW (ref 36.0–46.0)
Hemoglobin: 9 g/dL — ABNORMAL LOW (ref 12.0–15.0)
Immature Granulocytes: 1 %
Lymphocytes Relative: 12 %
Lymphs Abs: 0.9 10*3/uL (ref 0.7–4.0)
MCH: 25.1 pg — ABNORMAL LOW (ref 26.0–34.0)
MCHC: 30.3 g/dL (ref 30.0–36.0)
MCV: 83 fL (ref 80.0–100.0)
Monocytes Absolute: 0.7 10*3/uL (ref 0.1–1.0)
Monocytes Relative: 10 %
Neutro Abs: 5.8 10*3/uL (ref 1.7–7.7)
Neutrophils Relative %: 76 %
Platelet Count: 163 10*3/uL (ref 150–400)
RBC: 3.58 MIL/uL — ABNORMAL LOW (ref 3.87–5.11)
RDW: 18.7 % — ABNORMAL HIGH (ref 11.5–15.5)
WBC Count: 7.6 10*3/uL (ref 4.0–10.5)
nRBC: 0 % (ref 0.0–0.2)

## 2022-05-30 LAB — CMP (CANCER CENTER ONLY)
ALT: 7 U/L (ref 0–44)
AST: 16 U/L (ref 15–41)
Albumin: 3.6 g/dL (ref 3.5–5.0)
Alkaline Phosphatase: 88 U/L (ref 38–126)
Anion gap: 5 (ref 5–15)
BUN: 9 mg/dL (ref 8–23)
CO2: 26 mmol/L (ref 22–32)
Calcium: 8.9 mg/dL (ref 8.9–10.3)
Chloride: 106 mmol/L (ref 98–111)
Creatinine: 0.59 mg/dL (ref 0.44–1.00)
GFR, Estimated: >60 mL/min (ref >60)
Glucose, Bld: 83 mg/dL (ref 70–99)
Potassium: 4 mmol/L (ref 3.5–5.1)
Sodium: 137 mmol/L (ref 135–145)
Total Bilirubin: 0.3 mg/dL (ref 0.3–1.2)
Total Protein: 6.8 g/dL (ref 6.5–8.1)

## 2022-05-30 MED ORDER — DEXTROSE 5 % IV SOLN: Freq: Once | INTRAVENOUS | Status: AC

## 2022-05-30 MED ORDER — LEUCOVORIN CALCIUM INJECTION 350 MG
400.0000 mg/m2 | Freq: Once | INTRAVENOUS | Status: AC
  Administered 2022-05-30: 600 mg via INTRAVENOUS
  Filled 2022-05-30: qty 30

## 2022-05-30 MED ORDER — SODIUM CHLORIDE 0.9 % IV SOLN: Freq: Once | INTRAVENOUS | Status: AC

## 2022-05-30 MED ORDER — HEPARIN SOD (PORK) LOCK FLUSH 100 UNIT/ML IV SOLN: 500.0000 [IU] | Freq: Once | INTRAVENOUS | Status: DC | PRN

## 2022-05-30 MED ORDER — PALONOSETRON HCL INJECTION 0.25 MG/5ML
0.2500 mg | Freq: Once | INTRAVENOUS | Status: AC
  Administered 2022-05-30: 0.25 mg via INTRAVENOUS
  Filled 2022-05-30: qty 5

## 2022-05-30 MED ORDER — SODIUM CHLORIDE 0.9 % IV SOLN
1800.0000 mg/m2 | INTRAVENOUS | Status: DC
  Administered 2022-05-30: 2700 mg via INTRAVENOUS
  Filled 2022-05-30: qty 54

## 2022-05-30 MED ORDER — SODIUM CHLORIDE 0.9 % IV SOLN
10.0000 mg | Freq: Once | INTRAVENOUS | Status: AC
  Administered 2022-05-30: 10 mg via INTRAVENOUS
  Filled 2022-05-30: qty 10

## 2022-05-30 MED ORDER — SODIUM CHLORIDE 0.9% FLUSH
10.0000 mL | Freq: Once | INTRAVENOUS | Status: AC
  Administered 2022-05-30: 10 mL

## 2022-05-30 MED ORDER — SODIUM CHLORIDE 0.9 % IV SOLN
5.0000 mg/kg | Freq: Once | INTRAVENOUS | Status: AC
  Administered 2022-05-30: 250 mg via INTRAVENOUS
  Filled 2022-05-30: qty 10

## 2022-05-30 MED ORDER — OXALIPLATIN CHEMO INJECTION 100 MG/20ML
70.0000 mg/m2 | Freq: Once | INTRAVENOUS | Status: AC
  Administered 2022-05-30: 105 mg via INTRAVENOUS
  Filled 2022-05-30: qty 20

## 2022-05-30 MED ORDER — SODIUM CHLORIDE 0.9% FLUSH: 10.0000 mL | INTRAVENOUS | Status: DC | PRN

## 2022-05-30 NOTE — Patient Instructions (Signed)
[facility]  Discharge Instructions: Thank you for choosing [facility] to provide your oncology and hematology care.   If you have a lab appointment with the [facility], please go directly to the [facility] and check in at the registration area.   Wear comfortable clothing and clothing appropriate for easy access to any Portacath or PICC line.   We strive to give you quality time with your provider. You may need to reschedule your appointment if you arrive late (15 or more minutes).  Arriving late affects you and other patients whose appointments are after yours.  Also, if you miss three or more appointments without notifying the office, you may be dismissed from the clinic at the provider's discretion.      For prescription refill requests, have your pharmacy contact our office and allow 72 hours for refills to be completed.    Today you received the following chemotherapy and/or immunotherapy agents: Bevacizumab, Oxaliplatin, lecovorin, and Fluorouracil.    To help prevent nausea and vomiting after your treatment, we encourage you to take your nausea medication as directed.  BELOW ARE SYMPTOMS THAT SHOULD BE REPORTED IMMEDIATELY: *FEVER GREATER THAN 100.4 F (38 C) OR HIGHER *CHILLS OR SWEATING *NAUSEA AND VOMITING THAT IS NOT CONTROLLED WITH YOUR NAUSEA MEDICATION *UNUSUAL SHORTNESS OF BREATH *UNUSUAL BRUISING OR BLEEDING *URINARY PROBLEMS (pain or burning when urinating, or frequent urination) *BOWEL PROBLEMS (unusual diarrhea, constipation, pain near the anus) TENDERNESS IN MOUTH AND THROAT WITH OR WITHOUT PRESENCE OF ULCERS (sore throat, sores in mouth, or a toothache) UNUSUAL RASH, SWELLING OR PAIN  UNUSUAL VAGINAL DISCHARGE OR ITCHING   Items with * indicate a potential emergency and should be followed up as soon as possible or go to the Emergency Department if any problems should occur.  Please show the CHEMOTHERAPY ALERT CARD  or IMMUNOTHERAPY ALERT CARD at check-in to the Emergency Department and triage nurse.  Should you have questions after your visit or need to cancel or reschedule your appointment, please contact [facility]  Dept: [phone]  and follow the prompts.  Office hours are 8:00 a.m. to 4:30 p.m. Monday - Friday. Please note that voicemails left after 4:00 p.m. may not be returned until the following business day.  We are closed weekends and major holidays. You have access to a nurse at all times for urgent questions. Please call the main number to the clinic Dept: [phone] and follow the prompts.   For any non-urgent questions, you may also contact your provider using MyChart. We now offer e-Visits for anyone 72 and older to request care online for non-urgent symptoms. For details visit [email or web address].   Also download the MyChart app! Go to the app store, search "MyChart", open the app, select [facility], and log in with your MyChart username and password.  Masks are optional in the cancer centers. If you would like for your care team to wear a mask while they are taking care of you, please let them know. You may have one support person who is at least 73 years old accompany you for your appointments. The chemotherapy medication bag should finish at 46 hours, 96 hours, or 7 days. For example, if your pump is scheduled for 46 hours and it was put on at 4:00 p.m., it should finish at 2:00 p.m. the day it is scheduled to come off regardless of your appointment time.     Estimated time to finish at:  11: 15 am   If the display on your pump reads "Low Volume" and it is beeping, take the batteries out of the pump and come to the cancer center for it to be taken off.   If the pump alarms go off prior to the pump reading "Low Volume" then call [phone] and someone can assist you.  If the plunger comes out and the chemotherapy medication is leaking out, please  use your home chemo spill kit to clean up the spill. Do NOT use paper towels or other household products.  If you have problems or questions regarding your pump, please call either [phone] (24 hours a day) or the cancer center Monday-Friday 8:00 a.m.- 4:30 p.m. at the clinic number and we will assist you. If you are unable to get assistance, then go to the nearest Emergency Department and ask the staff to contact the IV team for assistance.

## 2022-05-30 NOTE — Patient Instructions (Signed)

## 2022-05-31 ENCOUNTER — Other Ambulatory Visit: Payer: Self-pay

## 2022-06-01 ENCOUNTER — Other Ambulatory Visit: Payer: Self-pay

## 2022-06-01 ENCOUNTER — Inpatient Hospital Stay: Payer: Federal, State, Local not specified - PPO | Attending: Nurse Practitioner

## 2022-06-01 VITALS — BP 150/82 | HR 104 | Temp 97.5°F | Resp 16

## 2022-06-01 DIAGNOSIS — Z1509 Genetic susceptibility to other malignant neoplasm: Secondary | ICD-10-CM | POA: Insufficient documentation

## 2022-06-01 DIAGNOSIS — I10 Essential (primary) hypertension: Secondary | ICD-10-CM | POA: Insufficient documentation

## 2022-06-01 DIAGNOSIS — D6481 Anemia due to antineoplastic chemotherapy: Secondary | ICD-10-CM | POA: Insufficient documentation

## 2022-06-01 DIAGNOSIS — Z923 Personal history of irradiation: Secondary | ICD-10-CM | POA: Diagnosis not present

## 2022-06-01 DIAGNOSIS — C787 Secondary malignant neoplasm of liver and intrahepatic bile duct: Secondary | ICD-10-CM | POA: Diagnosis not present

## 2022-06-01 DIAGNOSIS — Z5189 Encounter for other specified aftercare: Secondary | ICD-10-CM | POA: Diagnosis present

## 2022-06-01 DIAGNOSIS — C20 Malignant neoplasm of rectum: Secondary | ICD-10-CM

## 2022-06-01 DIAGNOSIS — Z5112 Encounter for antineoplastic immunotherapy: Secondary | ICD-10-CM | POA: Diagnosis present

## 2022-06-01 DIAGNOSIS — F419 Anxiety disorder, unspecified: Secondary | ICD-10-CM | POA: Diagnosis not present

## 2022-06-01 DIAGNOSIS — T451X5D Adverse effect of antineoplastic and immunosuppressive drugs, subsequent encounter: Secondary | ICD-10-CM | POA: Insufficient documentation

## 2022-06-01 DIAGNOSIS — Z9221 Personal history of antineoplastic chemotherapy: Secondary | ICD-10-CM | POA: Insufficient documentation

## 2022-06-01 DIAGNOSIS — Z79899 Other long term (current) drug therapy: Secondary | ICD-10-CM | POA: Insufficient documentation

## 2022-06-01 DIAGNOSIS — C182 Malignant neoplasm of ascending colon: Secondary | ICD-10-CM | POA: Diagnosis present

## 2022-06-01 MED ORDER — HEPARIN SOD (PORK) LOCK FLUSH 100 UNIT/ML IV SOLN
500.0000 [IU] | Freq: Once | INTRAVENOUS | Status: AC | PRN
  Administered 2022-06-01: 500 [IU]

## 2022-06-01 MED ORDER — SODIUM CHLORIDE 0.9% FLUSH: 3.0000 mL | INTRAVENOUS | Status: DC | PRN

## 2022-06-01 MED ORDER — SODIUM CHLORIDE 0.9% FLUSH
10.0000 mL | INTRAVENOUS | Status: DC | PRN
  Administered 2022-06-01: 10 mL

## 2022-06-01 MED ORDER — PEGFILGRASTIM-JMDB 6 MG/0.6ML ~~LOC~~ SOSY
6.0000 mg | PREFILLED_SYRINGE | Freq: Once | SUBCUTANEOUS | Status: AC
  Administered 2022-06-01: 6 mg via SUBCUTANEOUS
  Filled 2022-06-01: qty 0.6

## 2022-06-01 NOTE — Patient Instructions (Signed)

## 2022-06-06 ENCOUNTER — Ambulatory Visit: Payer: Federal, State, Local not specified - PPO | Admitting: Hematology

## 2022-06-06 ENCOUNTER — Ambulatory Visit: Payer: Federal, State, Local not specified - PPO

## 2022-06-06 ENCOUNTER — Other Ambulatory Visit: Payer: Federal, State, Local not specified - PPO

## 2022-06-11 NOTE — Progress Notes (Signed)
[place]   [phone] [phone]   Clinic Follow up Note   Patient Care Team: Patient, No Pcp Per as PCP - General (General Practice) [name], NP as Nurse Practitioner (Oncology) [name], MD as Consulting Physician (Oncology) [name], RN (Inactive) as Oncology Nurse Navigator 06/13/2022  CHIEF COMPLAINT: Follow-up metastatic rectal cancer, history of colon cancer  SUMMARY OF ONCOLOGIC HISTORY: Oncology History Overview Note  Cancer Staging Cancer of ascending colon [facility]) Staging form: Colon and Rectum, AJCC 7th Edition - Clinical stage from 02/07/2016: Stage IIIC (T4b, N1b, M0) - Signed by [name], MD on 03/04/2016 Laterality: Right Residual tumor (R): R2 - Macroscopic    Cancer of ascending colon ([place])  10/25/2015 Imaging   CT ABD/PELVIS:  Inflammatory changes inferior to the cecal tip appear improved, there is still irregular soft tissue thickening of the cecal tip, and there are adjacent prominent lymph nodes in the ileocolonic mesentery, measuring 13 mm on image 49 and 8 mm on image 52. In addition, there is a 2.5 x 1.8 cm nodule on image 46 which has central low density. Therefore, these findings are moderately suspicious for an underlying cecal malignancy with perforation.    01/19/2016 Procedure   COLONOSCOPY per Dr. [name]: Fungating, ulcerated mass almost obstructing mid ascending colon   01/19/2016 Initial Biopsy   Diagnosis Surgical [P], cecal mass - INVASIVE ADENOCARCINOMA WITH ULCERATION. - SEE COMMENT.   02/05/2016 Tumor Marker   Patient's tumor was tested for the following markers: CEA Results of the tumor marker test revealed 5.7.   02/07/2016 Initial Diagnosis   Cancer of ascending colon ([place])   02/07/2016 Definitive Surgery   Laparoscopic assisted right hemicolectomy and right salpingo oopherectomy--Dr. [name]   02/07/2016 Pathologic Stage   p T4 N1b   2/43 nodes +   02/07/2016 Pathology Results    MMR normal; G2 adenocarcinoma;proximal & distal margins negative; soft tissue mass on pelvic sidewall + for adenocarcinoma with positive margin MSI Stable   03/08/2016 Imaging   CT chest negative for metastasis.    03/19/2016 - 04/25/2016 Radiation Therapy   Adjuvant irradiation, 50 gray in 28 fractions   03/19/2016 - 04/22/2016 Chemotherapy   Xeloda 1500 mg twice daily, started on 03/19/2016, dose reduced to 1000 mg twice daily from week 3 due to neutropenia, and patient stopped 3 days before last dose radiation due to difficulty swallowing the pill    05/20/2016 -  Adjuvant Chemotherapy   Patient declined adjuvant chemotherapy   09/16/2016 Imaging   CT CAP w Contrast 1. No evidence of local tumor recurrence at the ileocolic anastomosis. 2. No findings suspicious for metastatic disease in the chest, abdomen or pelvis. 3. Nonspecific trace free fluid in the pelvic cul-de-sac. 4. Stable solitary 3 mm right upper lobe pulmonary nodule, for which 6 month stability has been demonstrated, probably benign. 5. Additional findings include stable right posterior pericardial cyst and small calcified uterine fibroids.   05/13/2017 Imaging   CT CAP W Contrast 05/13/17 IMPRESSION: 1. No current findings of residual or recurrent malignancy. 2. Mild prominence of stool throughout the colon. Nondistended portions of the rectum. 3. Several tiny pulmonary nodules are stable from the earliest available comparison of 03/08/2016 and probably benign, but may merit surveillance. 4. Other imaging findings of potential clinical significance: Old granulomatous disease. Aortoiliac atherosclerotic vascular disease. Lumbar spondylosis and degenerative disc disease. Stable amount of trace free pelvic fluid.   04/27/2018 Imaging   04/27/2018 CT CAP IMPRESSION:  Stable exam. No evidence of recurrent or metastatic carcinoma within the chest, abdomen, or pelvis   04/19/2019 Imaging   CT CAP W Contrast   IMPRESSION: Chest Impression:   1. No evidence of thoracic metastasis. 2. Stable small bilateral pulmonary nodules.   Abdomen / Pelvis Impression:   1. No evidence local colorectal carcinoma recurrence or metastasis in the abdomen pelvis. 2. Post RIGHT hemicolectomy.   01/22/2021 Imaging   CT CAP  IMPRESSION: CT CHEST IMPRESSION   1. Similar nonspecific pulmonary nodules. 2. New posterior left upper lobe reticulonodular opacity, suspicious for interval mild infection or inflammation. 3. No thoracic adenopathy.   CT ABDOMEN AND PELVIS IMPRESSION   1. Further decrease in size of high left hepatic lobe 3 mm low-density lesion. No new or progressive metastatic disease within the abdomen or pelvis. 2. Similar trace free pelvic fluid. 3. Similar nonspecific mid rectal wall thickening. 4.  Aortic Atherosclerosis (ICD10-I70.0).   04/23/2021 Imaging   CT CAP  IMPRESSION: 1. Treated metastatic lesion between segments 2 and 3 of the liver, slightly smaller and less distinct than prior examination. No other signs of definite metastatic disease elsewhere in the abdomen or pelvis. 2. Multiple small pulmonary nodules, stable compared to the prior examination, favored to be benign. No definitive findings to suggest metastatic disease to the thorax. 3. Aortic atherosclerosis. 4. Additional incidental findings, as above.   08/13/2021 Imaging   EXAM: CT CHEST, ABDOMEN, AND PELVIS WITH CONTRAST  IMPRESSION: 1. A previously seen PET avid lesion of the anterior left lobe of the liver, hepatic segment II, is no longer discretely appreciable consistent with treatment response of a hepatic metastasis. 2. No evidence of new metastatic disease in the chest, abdomen, or pelvis. 3. Interval increase in a small focus of consolidation and nodularity of the medial left upper lobe, consistent with minimal, ongoing atypical infection. Additional tiny bilateral pulmonary nodules are stable and  almost certainly incidental benign. Attention on follow-up. 4. Status post right hemicolectomy and ileocolic anastomosis.   Rectal cancer metastasized to liver ([name])  06/15/2020 Procedure   Screening Colonoscopy by Dr [name]  IMPRESSION - Decreased sphincter tone and internal hemorrhoids that prolapse with straining, but require manual replacement into the anal canal (Grade III) found on digital rectal exam. - Patent side-to-side ileo-colonic anastomosis, characterized by healthy appearing mucosa. - The examined portion of the ileum was normal. - One diminutive polyp in the proximal transverse colon, removed with a cold biopsy forceps. Resected and retrieved. - Likely malignant partially obstructing tumor in the mid rectum. Biopsied. Tattooed. - The examination was otherwise normal on direct and retroflexion views.   06/15/2020 Initial Biopsy   Diagnosis 1. Transverse Colon Polyp - HYPERPLASTIC POLYP 2. Rectum, biopsy - ADENOCARCINOMA ARISING IN A TUBULAR ADENOMA WITH HIGH-GRADE DYSPLASIA. SEE NOTE Diagnosis Note 2. Dr. [name] reviewed the case and concurs with the diagnosis. Dr. [name] was notified on 06/16/2020.   06/28/2020 Imaging   CT CAP  IMPRESSION: 1. New low-density focus in the anterior aspect of the lateral segment LEFT hepatic lobe measuring 1.2 x 1.0 cm, compatible with small metastatic lesion in the LEFT hepatic lobe. 2. Soft tissue in the RIGHT iliac fossa following RIGHT hemicolectomy invades the psoas musculature and is slowly enlarging over time, more linear on the prior study now highly concerning for recurrence/metastasis to this location. 3. Signs of enteritis, potentially post radiation changes of the small bowel. Tethered small bowel in the RIGHT lower quadrant shows focal thickening and narrowing suspicious  for small bowel involvement and developing partial obstruction though currently contrast passes beyond this point into the colon. 4. Rectal  thickening in this patient with known rectal mass as described. 5. No evidence of metastatic disease in the chest. 6. Stable small pulmonary nodules. 7.  and aortic atherosclerosis.   Aortic Atherosclerosis (ICD10-I70.0) and Emphysema (ICD10-J43.9).   07/04/2020 Initial Diagnosis   Rectal cancer metastasized to liver ([place])   07/12/2020 PET scan   IMPRESSION: 1. Exam positive for FDG avid rectal tumor which corresponds to the recent colonoscopy findings. 2. FDG avid soft tissue mass within the right iliac fossa is noted and consistent with local tumor recurrence from previous ascending colon tumor. 3. Lateral segment left lobe of liver lesion is FDG avid concerning for liver metastasis. 4. No specific findings identified to suggest metastatic disease to the chest.   08/10/2020 -  Chemotherapy   First-line FOLFIRI q2weeks starting 08/10/20. dose reduced with cycle 1. Irinotecan/5FU increased and Bevacizumab added with cycle 2 on 08/23/2020    08/17/2020 - 03/09/2022 Chemotherapy   Patient is on Treatment Plan : COLORECTAL FOLFIRI + Bevacizumab q14d     10/27/2020 Imaging   CT CAP  IMPRESSION: 1. Interval decrease in size of the hypermetabolic left hepatic lesion, consistent with metastatic disease. No new liver lesion evident. 2. Interval resolution of the hypermetabolic soft tissue lesion along the right iliac fossa with no measurable soft tissue lesion remaining at this location today. 3. Similar appearance of soft tissue fullness in the rectum at the site of the hypermetabolic lesion seen previously. 4. Stable tiny bilateral pulmonary nodules. Continued attention on follow-up recommended. 5. Small volume free fluid in the pelvis. 6. Aortic Atherosclerosis (ICD10-I70.0).   01/22/2021 Imaging   CT CAP  IMPRESSION: CT CHEST IMPRESSION   1. Similar nonspecific pulmonary nodules. 2. New posterior left upper lobe reticulonodular opacity, suspicious for interval mild infection or  inflammation. 3. No thoracic adenopathy.   CT ABDOMEN AND PELVIS IMPRESSION   1. Further decrease in size of high left hepatic lobe 3 mm low-density lesion. No new or progressive metastatic disease within the abdomen or pelvis. 2. Similar trace free pelvic fluid. 3. Similar nonspecific mid rectal wall thickening. 4.  Aortic Atherosclerosis (ICD10-I70.0).   04/23/2021 Imaging   CT CAP  IMPRESSION: 1. Treated metastatic lesion between segments 2 and 3 of the liver, slightly smaller and less distinct than prior examination. No other signs of definite metastatic disease elsewhere in the abdomen or pelvis. 2. Multiple small pulmonary nodules, stable compared to the prior examination, favored to be benign. No definitive findings to suggest metastatic disease to the thorax. 3. Aortic atherosclerosis. 4. Additional incidental findings, as above.   08/13/2021 Imaging   EXAM: CT CHEST, ABDOMEN, AND PELVIS WITH CONTRAST  IMPRESSION: 1. A previously seen PET avid lesion of the anterior left lobe of the liver, hepatic segment II, is no longer discretely appreciable consistent with treatment response of a hepatic metastasis. 2. No evidence of new metastatic disease in the chest, abdomen, or pelvis. 3. Interval increase in a small focus of consolidation and nodularity of the medial left upper lobe, consistent with minimal, ongoing atypical infection. Additional tiny bilateral pulmonary nodules are stable and almost certainly incidental benign. Attention on follow-up. 4. Status post right hemicolectomy and ileocolic anastomosis.   03/28/2022 -  Chemotherapy   Patient is on Treatment Plan : COLORECTAL FOLFOX + Bevacizumab q14d       CURRENT THERAPY: FOLFOX with bevacizumab, q. 14 days  starting 03/28/2022  INTERVAL HISTORY: Brittney Spencer returns for follow-up and treatment as scheduled, last seen by Dr. Burr Medico 05/30/2022 with cycle 5 FOLFOX/Bev.  She tolerated treatment well overall.  She  notes her blood pressure has been fluctuating.  Cold sensitivity "is forever" but improves after a week.  She has some residual tingling when it is cold but no significant neuropathy.  She is managing at home with workaround's for certain things and functioning well.  She manages constipation with MiraLAX.  Denies nausea/vomiting.  Denies new/worsening pain, fever, chills, cough, chest pain, dyspnea, or any other new specific complaints.  All other systems were reviewed with the patient and are negative.  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 Some residual intraretinal fluid remains, very small localized subfoveal of fluid remains although this continues to slowly resorb. We'll continue to observe.   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   Resolved March 2021 post vitrectomy    SURGICAL HISTORY: Past Surgical History:  Procedure Laterality Date   COLONOSCOPY  2018   [facility]   COLONSCOPY  12/2015   IR IMAGING GUIDED PORT INSERTION  08/04/2020   LAPAROSCOPIC RIGHT HEMI COLECTOMY Right 02/07/2016   Procedure: LAPAROSCOPIC ASSISTED RIGHT HEMI COLECTOMY AND RIGHT SALPINGO OOPHERECTOMY;  Surgeon: [name], MD;  Location: [facility];  Service: General;  Laterality: Right;   RETINAL DETACHMENT SURGERY  09/2019    I have reviewed the social history and family history with the patient and they are unchanged  from previous note.  ALLERGIES:  is allergic to fish allergy, peanut-containing drug products, soy allergy, and buspirone.  MEDICATIONS:  Current Outpatient Medications  Medication Sig Dispense Refill   ALPRAZolam (XANAX) 0.25 MG tablet Take 1 tablet (0.25 mg total) by mouth daily as needed for anxiety. 30 tablet 0   Cholecalciferol (VITAMIN D3 PO) Take by mouth daily.     docusate sodium (COLACE) 100 MG capsule 1 capsule as needed     lidocaine-prilocaine (EMLA) cream Apply 1 application topically as needed. 30 g 1   NORVASC 2.5 MG tablet Take 1 tablet (2.5 mg total) by mouth daily. 90 tablet 1   ondansetron (ZOFRAN) 8 MG tablet Take 1 tablet (8 mg total) by mouth every 8 (eight) hours as needed for nausea or vomiting. 20 tablet 2   prochlorperazine (COMPAZINE) 10 MG tablet Take 1 tablet (10 mg total) by mouth every 6 (six) hours as needed for nausea or vomiting. 30 tablet 2   Current Facility-Administered Medications  Medication Dose Route Frequency Provider Last Rate Last Admin   0.9 %  sodium chloride infusion  500 mL Intravenous Once Doran Stabler, MD       Facility-Administered Medications Ordered in Other Visits  Medication Dose Route Frequency Provider Last Rate Last Admin   0.9 %  sodium  chloride infusion   Intravenous Once [name], MD       bevacizumab-bvzr (ZIRABEV) 250 mg in sodium chloride 0.9 % 100 mL chemo infusion  5 mg/kg (Order-Specific) Intravenous Once [name], MD       dexamethasone (DECADRON) 10 mg in sodium chloride 0.9 % 50 mL IVPB  10 mg Intravenous Once [name], MD       dextrose 5 % solution   Intravenous Once [name], MD       fluorouracil (ADRUCIL) 2,250 mg in sodium chloride 0.9 % 105 mL chemo infusion  1,500 mg/m2 (Order-Specific) Intravenous 1 day or 1 dose [name], MD       heparin lock flush 100 unit/mL  500 Units Intracatheter Once PRN [name], MD       leucovorin 600 mg in dextrose 5 % 250 mL infusion  400 mg/m2 (Order-Specific)  Intravenous Once [name], MD       oxaliplatin (ELOXATIN) 75 mg in dextrose 5 % 500 mL chemo infusion  50 mg/m2 (Order-Specific) Intravenous Once [name], MD       palonosetron (ALOXI) injection 0.25 mg  0.25 mg Intravenous Once [name], MD       sodium chloride flush (NS) 0.9 % injection 10 mL  10 mL Intracatheter PRN [name], MD        PHYSICAL EXAMINATION: ECOG PERFORMANCE STATUS: 1 - Symptomatic but completely ambulatory  Vitals:   06/13/22 0900 06/13/22 0915  BP: (!) 157/83 120/70  Pulse: 93   Resp: 16   Temp: 98.4 F (36.9 C)   SpO2: 100%    Filed Weights   06/13/22 0900  Weight: 103 lb 1.6 oz (46.8 kg)    GENERAL:alert, no distress and comfortable SKIN: Palms dark.  EYES: sclera clear LUNGS:  normal breathing effort HEART:  no lower extremity edema NEURO: alert & oriented x 3 with fluent speech, nonfocal PAC without erythema    LABORATORY DATA:  I have reviewed the data as listed    Latest Ref Rng & Units 06/13/2022    8:13 AM 05/30/2022    7:40 AM 05/16/2022    9:25 AM  CBC  WBC 4.0 - 10.5 K/uL 6.6  7.6  4.2   Hemoglobin 12.0 - 15.0 g/dL 8.7  9.0  8.8   Hematocrit 36.0 - 46.0 % 28.9  29.7  28.8   Platelets 150 - 400 K/uL 142  163  224         Latest Ref Rng & Units 06/13/2022    8:13 AM 05/30/2022    7:40 AM 05/16/2022    9:25 AM  CMP  Glucose 70 - 99 mg/dL 78  83  78   BUN 8 - 23 mg/dL _0 Creatinine 0.44 - 1.00 mg/dL 0.59  0.59  0.60   Sodium 135 - 145 mmol/L 137  137  137   Potassium 3.5 - 5.1 mmol/L 3.9  4.0  4.0   Chloride 98 - 111 mmol/L 106  106  104   CO2 22 - 32 mmol/L _1 Calcium 8.9 - 10.3 mg/dL 8.8  8.9  9.1   Total Protein 6.5 - 8.1 g/dL 6.3  6.8  7.0   Total Bilirubin 0.3 - 1.2 mg/dL 0.2  0.3  0.3   Alkaline Phos 38 - 126 U/L 95  88  75   AST 15 - 41 U/L 16  16  14  ALT 0 - 44 U/L _0 RADIOGRAPHIC STUDIES: I have personally reviewed the radiological images as listed and agreed with the  findings in the report. No results found.   ASSESSMENT & PLAN: Brittney Spencer is a 71 y.o. female with   1. Rectal adenocarcinoma, with liver and right pelvic metastasis, recurrence from previous colon cancer vs new primary   -Diagnosed on routine screening colonoscopy 05/2020, rectal mass biopsy showed invasive adenocarcinoma, arising from a tubular adenoma -Her 07/12/20 PET showed positive uptake of known rectal tumor and soft tissue mass within the right iliac fossa indicating recurrent prior colon cancer. There is also left lobe of liver lesion is FDG avid concerning for liver metastasis. pt declined liver biopsy -whether this is metastatic rectal or recurrent/metastatic colon cancer, this is likely not curable, but still treatable. She previously declined referral for HIPEC -She began first-line systemic chemo with dose-reduced FOLFIRI on 08/06/20, Bevacizumab added, and irinotecan/5FU increased with cycle 2. She eventually changed to maintenance therapy with irinotecan and bevacizumab every 2 weeks on 01/24/2021 -CEA began rising, CT CAP 12/10/2021 showed new 1.5 cm right hepatic lobe metastasis, and stable soft tissue thickening in right iliac fossa and pulmonary nodules. 5FU was added back -Restaging CT CAP 03/18/2022 showed increase in size of right liver lobe metastasis, otherwise stable.  Her CEA continues to rise -She switched to FOLFOX and continued Beva starting 03/28/2022, s/p cycle 5 - No rash Brittney Spencer appears stable. S/P cycle 5 FOLFOX/Bev tolerating well with cold sensitivity and constipation.  Side effects are adequately managed with supportive care at home.  She is able to recover and function well.  No clinical evidence of disease progression -Labs reviewed, adequate to proceed with cycle 6 FOLFOX/Bev at today's planned, mild dose reductions for the upcoming holiday -She is being scheduled for restaging CT before next visit in 3 weeks   2. Cancer of ascending colon, [id number],  stage IIIC, MSI-stable, (+) surgical margins at pelvic wall     -She was diagnosed in 12/2015. She is s/p right hemicolectomy with right salpingo oophorectomy and  adjuvant ChemoRT. -she declined adjuvant chemo due to the concern of side effects and impact on her quality of life. -Her 04/2019 CT scan was NED  -With rectal cancer diagnosis, her PET from 07/12/20 indicates soft tissue mass within the right iliac fossa is noted and consistent with local tumor recurrence from previous ascending colon tumor.  Subsequent imaging showed this resolved after beginning treatment -She declined option of liver biopsy.     3. Mild Anemia -Has been stable and mild for the past year. Worse with rectal cancer and starting chemo -denies bleeding -Stable in 8-9 range on chemo   4. HTN, Anxiety -She'll follow-up with her primary care physician and continue medication -She uses half tab Xanax as needed, mostly only on treatment days. refilled -stable, refilled   Plan: -Labs reviewed, proceed with dose-reduced cycle 6 FOLFOX/Bev at today as planned -Lab/flush and restaging CT 1/2 -Follow-up and next cycle 1/4 as previously scheduled   All questions were answered. The patient knows to call the clinic with any problems, questions or concerns. No barriers to learning were detected.     [name], NP 06/13/22

## 2022-06-12 MED FILL — Dexamethasone Sodium Phosphate Inj 100 MG/10ML: INTRAMUSCULAR | Qty: 1 | Status: AC

## 2022-06-13 ENCOUNTER — Ambulatory Visit: Payer: Federal, State, Local not specified - PPO

## 2022-06-13 ENCOUNTER — Inpatient Hospital Stay: Payer: Federal, State, Local not specified - PPO

## 2022-06-13 ENCOUNTER — Telehealth: Payer: Self-pay

## 2022-06-13 ENCOUNTER — Encounter: Payer: Self-pay | Admitting: Nurse Practitioner

## 2022-06-13 ENCOUNTER — Inpatient Hospital Stay: Payer: Federal, State, Local not specified - PPO | Admitting: Nurse Practitioner

## 2022-06-13 ENCOUNTER — Other Ambulatory Visit: Payer: Self-pay

## 2022-06-13 VITALS — BP 153/83 | HR 77 | Resp 17

## 2022-06-13 DIAGNOSIS — C787 Secondary malignant neoplasm of liver and intrahepatic bile duct: Secondary | ICD-10-CM | POA: Diagnosis not present

## 2022-06-13 DIAGNOSIS — C20 Malignant neoplasm of rectum: Secondary | ICD-10-CM | POA: Diagnosis not present

## 2022-06-13 DIAGNOSIS — Z5112 Encounter for antineoplastic immunotherapy: Secondary | ICD-10-CM | POA: Diagnosis not present

## 2022-06-13 DIAGNOSIS — Z95828 Presence of other vascular implants and grafts: Secondary | ICD-10-CM

## 2022-06-13 LAB — CBC WITH DIFFERENTIAL (CANCER CENTER ONLY)
Abs Immature Granulocytes: 0.02 10*3/uL (ref 0.00–0.07)
Basophils Absolute: 0 10*3/uL (ref 0.0–0.1)
Basophils Relative: 0 %
Eosinophils Absolute: 0.1 10*3/uL (ref 0.0–0.5)
Eosinophils Relative: 1 %
HCT: 28.9 % — ABNORMAL LOW (ref 36.0–46.0)
Hemoglobin: 8.7 g/dL — ABNORMAL LOW (ref 12.0–15.0)
Immature Granulocytes: 0 %
Lymphocytes Relative: 14 %
Lymphs Abs: 0.9 10*3/uL (ref 0.7–4.0)
MCH: 24.9 pg — ABNORMAL LOW (ref 26.0–34.0)
MCHC: 30.1 g/dL (ref 30.0–36.0)
MCV: 82.6 fL (ref 80.0–100.0)
Monocytes Absolute: 0.7 10*3/uL (ref 0.1–1.0)
Monocytes Relative: 11 %
Neutro Abs: 4.8 10*3/uL (ref 1.7–7.7)
Neutrophils Relative %: 74 %
Platelet Count: 142 10*3/uL — ABNORMAL LOW (ref 150–400)
RBC: 3.5 MIL/uL — ABNORMAL LOW (ref 3.87–5.11)
RDW: 19.3 % — ABNORMAL HIGH (ref 11.5–15.5)
WBC Count: 6.6 10*3/uL (ref 4.0–10.5)
nRBC: 0 % (ref 0.0–0.2)

## 2022-06-13 LAB — CMP (CANCER CENTER ONLY)
ALT: 7 U/L (ref 0–44)
AST: 16 U/L (ref 15–41)
Albumin: 3.5 g/dL (ref 3.5–5.0)
Alkaline Phosphatase: 95 U/L (ref 38–126)
Anion gap: 4 — ABNORMAL LOW (ref 5–15)
BUN: 10 mg/dL (ref 8–23)
CO2: 27 mmol/L (ref 22–32)
Calcium: 8.8 mg/dL — ABNORMAL LOW (ref 8.9–10.3)
Chloride: 106 mmol/L (ref 98–111)
Creatinine: 0.59 mg/dL (ref 0.44–1.00)
GFR, Estimated: >60 mL/min (ref >60)
Glucose, Bld: 78 mg/dL (ref 70–99)
Potassium: 3.9 mmol/L (ref 3.5–5.1)
Sodium: 137 mmol/L (ref 135–145)
Total Bilirubin: 0.2 mg/dL — ABNORMAL LOW (ref 0.3–1.2)
Total Protein: 6.3 g/dL — ABNORMAL LOW (ref 6.5–8.1)

## 2022-06-13 LAB — TOTAL PROTEIN, URINE DIPSTICK: Protein, ur: NEGATIVE mg/dL

## 2022-06-13 LAB — CEA (IN HOUSE-CHCC): CEA (CHCC-In House): 15.99 ng/mL — ABNORMAL HIGH (ref 0.00–5.00)

## 2022-06-13 MED ORDER — DEXTROSE 5 % IV SOLN: Freq: Once | INTRAVENOUS | Status: AC

## 2022-06-13 MED ORDER — SODIUM CHLORIDE 0.9 % IV SOLN: Freq: Once | INTRAVENOUS | Status: AC

## 2022-06-13 MED ORDER — SODIUM CHLORIDE 0.9 % IV SOLN
1500.0000 mg/m2 | INTRAVENOUS | Status: DC
  Administered 2022-06-13: 2250 mg via INTRAVENOUS
  Filled 2022-06-13: qty 45

## 2022-06-13 MED ORDER — SODIUM CHLORIDE 0.9% FLUSH: 10.0000 mL | INTRAVENOUS | Status: DC | PRN

## 2022-06-13 MED ORDER — LEUCOVORIN CALCIUM INJECTION 350 MG
400.0000 mg/m2 | Freq: Once | INTRAVENOUS | Status: AC
  Administered 2022-06-13: 600 mg via INTRAVENOUS
  Filled 2022-06-13: qty 30

## 2022-06-13 MED ORDER — SODIUM CHLORIDE 0.9% FLUSH
10.0000 mL | Freq: Once | INTRAVENOUS | Status: AC
  Administered 2022-06-13: 10 mL

## 2022-06-13 MED ORDER — OXALIPLATIN CHEMO INJECTION 100 MG/20ML
50.0000 mg/m2 | Freq: Once | INTRAVENOUS | Status: AC
  Administered 2022-06-13: 75 mg via INTRAVENOUS
  Filled 2022-06-13: qty 15

## 2022-06-13 MED ORDER — SODIUM CHLORIDE 0.9 % IV SOLN
5.0000 mg/kg | Freq: Once | INTRAVENOUS | Status: AC
  Administered 2022-06-13: 250 mg via INTRAVENOUS
  Filled 2022-06-13: qty 10

## 2022-06-13 MED ORDER — SODIUM CHLORIDE 0.9 % IV SOLN
10.0000 mg | Freq: Once | INTRAVENOUS | Status: AC
  Administered 2022-06-13: 10 mg via INTRAVENOUS
  Filled 2022-06-13: qty 10

## 2022-06-13 MED ORDER — PALONOSETRON HCL INJECTION 0.25 MG/5ML
0.2500 mg | Freq: Once | INTRAVENOUS | Status: AC
  Administered 2022-06-13: 0.25 mg via INTRAVENOUS

## 2022-06-13 MED ORDER — HEPARIN SOD (PORK) LOCK FLUSH 100 UNIT/ML IV SOLN: 500.0000 [IU] | Freq: Once | INTRAVENOUS | Status: DC | PRN

## 2022-06-13 NOTE — Telephone Encounter (Signed)
Scheduled patients CT for 1-2 after her port flush at 8am. Patient is to arrive at radiology at 9am to pick up contrast and back at 11am for scan. Patient should be NPO before procedure. Patient has been made aware and fully understands.

## 2022-06-15 ENCOUNTER — Inpatient Hospital Stay: Payer: Federal, State, Local not specified - PPO

## 2022-06-15 VITALS — BP 127/73 | HR 73 | Temp 98.1°F | Resp 19 | Ht 66.0 in

## 2022-06-15 DIAGNOSIS — Z5112 Encounter for antineoplastic immunotherapy: Secondary | ICD-10-CM | POA: Diagnosis not present

## 2022-06-15 DIAGNOSIS — C20 Malignant neoplasm of rectum: Secondary | ICD-10-CM

## 2022-06-15 MED ORDER — HEPARIN SOD (PORK) LOCK FLUSH 100 UNIT/ML IV SOLN: 250.0000 [IU] | Freq: Once | INTRAVENOUS | Status: DC | PRN

## 2022-06-15 MED ORDER — HEPARIN SOD (PORK) LOCK FLUSH 100 UNIT/ML IV SOLN
500.0000 [IU] | Freq: Once | INTRAVENOUS | Status: AC | PRN
  Administered 2022-06-15: 500 [IU]

## 2022-06-15 MED ORDER — PEGFILGRASTIM-JMDB 6 MG/0.6ML ~~LOC~~ SOSY
6.0000 mg | PREFILLED_SYRINGE | Freq: Once | SUBCUTANEOUS | Status: AC
  Administered 2022-06-15: 6 mg via SUBCUTANEOUS

## 2022-06-16 ENCOUNTER — Other Ambulatory Visit: Payer: Self-pay

## 2022-06-17 ENCOUNTER — Telehealth: Payer: Self-pay | Admitting: Hematology

## 2022-06-17 NOTE — Telephone Encounter (Signed)
Called patient to inform of upcoming appointments. Patient notified and mailing calendar.

## 2022-06-18 ENCOUNTER — Other Ambulatory Visit: Payer: Self-pay

## 2022-06-20 ENCOUNTER — Ambulatory Visit: Payer: Federal, State, Local not specified - PPO

## 2022-06-20 ENCOUNTER — Ambulatory Visit: Payer: Federal, State, Local not specified - PPO | Admitting: Hematology

## 2022-06-20 ENCOUNTER — Other Ambulatory Visit: Payer: Federal, State, Local not specified - PPO

## 2022-07-02 ENCOUNTER — Inpatient Hospital Stay: Payer: Federal, State, Local not specified - PPO | Attending: Nurse Practitioner

## 2022-07-02 ENCOUNTER — Ambulatory Visit (HOSPITAL_COMMUNITY)
Admission: RE | Admit: 2022-07-02 | Discharge: 2022-07-02 | Disposition: A | Discharge status: Home / Self Care | Payer: Federal, State, Local not specified - PPO | Source: Ambulatory Visit | Attending: Hematology | Admitting: Hematology

## 2022-07-02 ENCOUNTER — Other Ambulatory Visit: Payer: Self-pay

## 2022-07-02 DIAGNOSIS — Z9221 Personal history of antineoplastic chemotherapy: Secondary | ICD-10-CM | POA: Insufficient documentation

## 2022-07-02 DIAGNOSIS — I10 Essential (primary) hypertension: Secondary | ICD-10-CM | POA: Insufficient documentation

## 2022-07-02 DIAGNOSIS — Z5189 Encounter for other specified aftercare: Secondary | ICD-10-CM | POA: Insufficient documentation

## 2022-07-02 DIAGNOSIS — T451X5D Adverse effect of antineoplastic and immunosuppressive drugs, subsequent encounter: Secondary | ICD-10-CM | POA: Insufficient documentation

## 2022-07-02 DIAGNOSIS — D6481 Anemia due to antineoplastic chemotherapy: Secondary | ICD-10-CM | POA: Insufficient documentation

## 2022-07-02 DIAGNOSIS — Z95828 Presence of other vascular implants and grafts: Secondary | ICD-10-CM

## 2022-07-02 DIAGNOSIS — C787 Secondary malignant neoplasm of liver and intrahepatic bile duct: Secondary | ICD-10-CM | POA: Insufficient documentation

## 2022-07-02 DIAGNOSIS — F419 Anxiety disorder, unspecified: Secondary | ICD-10-CM | POA: Insufficient documentation

## 2022-07-02 DIAGNOSIS — C182 Malignant neoplasm of ascending colon: Secondary | ICD-10-CM | POA: Diagnosis not present

## 2022-07-02 DIAGNOSIS — Z923 Personal history of irradiation: Secondary | ICD-10-CM | POA: Insufficient documentation

## 2022-07-02 DIAGNOSIS — Z5112 Encounter for antineoplastic immunotherapy: Secondary | ICD-10-CM | POA: Insufficient documentation

## 2022-07-02 DIAGNOSIS — Z5111 Encounter for antineoplastic chemotherapy: Secondary | ICD-10-CM | POA: Insufficient documentation

## 2022-07-02 DIAGNOSIS — Z1509 Genetic susceptibility to other malignant neoplasm: Secondary | ICD-10-CM | POA: Insufficient documentation

## 2022-07-02 DIAGNOSIS — Z79899 Other long term (current) drug therapy: Secondary | ICD-10-CM | POA: Insufficient documentation

## 2022-07-02 DIAGNOSIS — Z66 Do not resuscitate: Secondary | ICD-10-CM | POA: Insufficient documentation

## 2022-07-02 LAB — CBC WITH DIFFERENTIAL/PLATELET
Abs Immature Granulocytes: 0.02 10*3/uL (ref 0.00–0.07)
Basophils Absolute: 0 10*3/uL (ref 0.0–0.1)
Basophils Relative: 0 %
Eosinophils Absolute: 0 10*3/uL (ref 0.0–0.5)
Eosinophils Relative: 1 %
HCT: 29.2 % — ABNORMAL LOW (ref 36.0–46.0)
Hemoglobin: 8.9 g/dL — ABNORMAL LOW (ref 12.0–15.0)
Immature Granulocytes: 0 %
Lymphocytes Relative: 23 %
Lymphs Abs: 1.1 10*3/uL (ref 0.7–4.0)
MCH: 24.9 pg — ABNORMAL LOW (ref 26.0–34.0)
MCHC: 30.5 g/dL (ref 30.0–36.0)
MCV: 81.8 fL (ref 80.0–100.0)
Monocytes Absolute: 0.6 10*3/uL (ref 0.1–1.0)
Monocytes Relative: 13 %
Neutro Abs: 3.1 10*3/uL (ref 1.7–7.7)
Neutrophils Relative %: 63 %
Platelets: 183 10*3/uL (ref 150–400)
RBC: 3.57 MIL/uL — ABNORMAL LOW (ref 3.87–5.11)
RDW: 20.1 % — ABNORMAL HIGH (ref 11.5–15.5)
WBC: 4.9 10*3/uL (ref 4.0–10.5)
nRBC: 0 % (ref 0.0–0.2)

## 2022-07-02 LAB — COMPREHENSIVE METABOLIC PANEL
ALT: 12 U/L (ref 0–44)
AST: 23 U/L (ref 15–41)
Albumin: 3.5 g/dL (ref 3.5–5.0)
Alkaline Phosphatase: 74 U/L (ref 38–126)
Anion gap: 5 (ref 5–15)
BUN: 13 mg/dL (ref 8–23)
CO2: 26 mmol/L (ref 22–32)
Calcium: 9 mg/dL (ref 8.9–10.3)
Chloride: 107 mmol/L (ref 98–111)
Creatinine, Ser: 0.58 mg/dL (ref 0.44–1.00)
GFR, Estimated: >60 mL/min (ref >60)
Glucose, Bld: 77 mg/dL (ref 70–99)
Potassium: 4 mmol/L (ref 3.5–5.1)
Sodium: 138 mmol/L (ref 135–145)
Total Bilirubin: 0.3 mg/dL (ref 0.3–1.2)
Total Protein: 6.7 g/dL (ref 6.5–8.1)

## 2022-07-02 MED ORDER — IOHEXOL 9 MG/ML PO SOLN: 1000.0000 mL | ORAL | Status: AC

## 2022-07-02 MED ORDER — SODIUM CHLORIDE (PF) 0.9 % IJ SOLN
INTRAMUSCULAR | Status: AC
  Filled 2022-07-02: qty 50

## 2022-07-02 MED ORDER — HEPARIN SOD (PORK) LOCK FLUSH 100 UNIT/ML IV SOLN
INTRAVENOUS | Status: AC
  Filled 2022-07-02: qty 5

## 2022-07-02 MED ORDER — SODIUM CHLORIDE 0.9% FLUSH
10.0000 mL | Freq: Once | INTRAVENOUS | Status: AC
  Administered 2022-07-02: 10 mL

## 2022-07-02 MED ORDER — IOHEXOL 300 MG/ML  SOLN
80.0000 mL | Freq: Once | INTRAMUSCULAR | Status: AC | PRN
  Administered 2022-07-02: 80 mL via INTRAVENOUS

## 2022-07-02 MED ORDER — HEPARIN SOD (PORK) LOCK FLUSH 100 UNIT/ML IV SOLN: 500.0000 [IU] | Freq: Once | INTRAVENOUS | Status: DC

## 2022-07-02 MED ORDER — IOHEXOL 9 MG/ML PO SOLN
ORAL | Status: AC
  Administered 2022-07-02: 1000 mL
  Filled 2022-07-02: qty 1000

## 2022-07-03 MED FILL — Dexamethasone Sodium Phosphate Inj 100 MG/10ML: INTRAMUSCULAR | Qty: 1 | Status: AC

## 2022-07-03 NOTE — Assessment & Plan Note (Signed)
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  -She began first-line chemo with dose-reduced FOLFIRI and Beva in 2/22.  -we changed to FOLFOX with continued Beva on 03/28/22. She tolerated well overall with some fatigue and cold sensitivity. -restaging CT yesterday showed overall SD with slight improvement, no new lesions. I discussed with her

## 2022-07-03 NOTE — Assessment & Plan Note (Signed)
[id number], stage IIIC, MSI-stable, (+) surgical margins at pelvic wall     -diagnosed in 12/2015. S/p right hemicolectomy with right salpingo oophorectomy and adjuvant ChemoRT. -Her 04/2019 CT scan was NED  -With recent rectal cancer diagnosis, her PET from 07/12/20 indicates soft tissue mass within the right iliac fossa is noted and consistent with local tumor recurrence from previous ascending colon tumor.

## 2022-07-03 NOTE — Assessment & Plan Note (Signed)
-  continued medications and f/u per PCP -stable

## 2022-07-03 NOTE — Progress Notes (Unsigned)
[facility]   [phone] [phone]   Clinic Follow up Note   Patient Care Team: Patient, No Pcp Per as PCP - General (General Practice) [name], NP as Nurse Practitioner (Oncology) [name], MD as Consulting Physician (Oncology) [name], RN (Inactive) as Oncology Nurse Navigator  Date of Service:  07/04/2022  CHIEF COMPLAINT: f/u of  metastatic rectal cancer, h/o colon cancer     CURRENT THERAPY: FOLFOX with bevacizumab, q14d, starting 03/28/22      ASSESSMENT:  Brittney Spencer is a 72 y.o. 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  -She began first-line chemo with dose-reduced FOLFIRI and Beva in 2/22.  -we changed to FOLFOX with continued Beva on 03/28/22. She tolerated well overall with some fatigue and cold sensitivity. -restaging CT yesterday showed overall SD with slight improvement, no new lesions. I discussed with her   Cancer of ascending colon ([place]) [id number], stage IIIC, MSI-stable, (+) surgical margins at pelvic wall     -diagnosed in 12/2015. S/p right hemicolectomy with right salpingo oophorectomy and adjuvant ChemoRT. -Her 04/2019 CT scan was NED  -With recent rectal cancer diagnosis, her PET from 07/12/20 indicates soft tissue mass within the right iliac fossa is noted and consistent with local tumor recurrence from previous ascending colon tumor.  Essential hypertension -continued medications and f/u per PCP -stable  DNR (do not resuscitate) -code status DNR -she has informed her daughter also     PLAN: -lab reviewed stabled -Discuss tumor marker and CT Scan, she has had SD with mild improvement  -Encourage the pt to watch for Neuropathy -per pt request, will change FOLFOX to CAPOX, I called in Xeloda today to be started in 2  weeks -proceed C7 FOLFOX lab adequate for treatment, will resume oxaliplatin at 54m/m2 and continue same dose with CAPOX every 3 weeks  -lab,flush,f/u and treatment 07/18/22 to start CAPOX and beva   SUMMARY OF ONCOLOGIC HISTORY: Oncology History Overview Note  Cancer Staging Cancer of ascending colon ([facility] Staging form: Colon and Rectum, AJCC 7th Edition - Clinical stage from 02/07/2016: Stage IIIC (T4b, N1b, M0) - Signed by FTruitt Merle MD on 03/04/2016 Laterality: Right Residual tumor (R): R2 - Macroscopic    [place] colon ([place]  10/25/2015 Imaging   CT ABD/PELVIS:  Inflammatory changes inferior to the cecal tip appear improved, there is still irregular soft tissue thickening of the cecal tip, and there are adjacent prominent lymph nodes in the ileocolonic mesentery, measuring 13 mm on image 49 and 8 mm on image 52. In addition, there is a 2.5 x 1.8 cm nodule on image 46 which has central low density. Therefore, these findings are moderately suspicious for an underlying cecal malignancy with perforation.    01/19/2016 Procedure   COLONOSCOPY per Dr. DLoletha Carrow Fungating, ulcerated mass almost obstructing mid ascending colon   01/19/2016 Initial Biopsy   Diagnosis Surgical [P], cecal mass - INVASIVE ADENOCARCINOMA WITH ULCERATION. - SEE COMMENT.   02/05/2016 Tumor Marker   Patient's tumor was tested for the following markers: CEA Results of the tumor marker test revealed 5.7.   02/07/2016 Initial Diagnosis   [place]   02/07/2016 Definitive Surgery   Laparoscopic assisted right hemicolectomy and right salpingo oopherectomy--Dr. HExcell Seltzer  02/07/2016 Pathologic Stage   p T4 N1b  2/43 nodes +   02/07/2016 Pathology Results   MMR normal; G2 adenocarcinoma;proximal & distal margins negative; soft tissue mass on pelvic sidewall + for adenocarcinoma with positive margin MSI Stable   03/08/2016 Imaging   CT chest negative for metastasis.    03/19/2016 -  04/25/2016 Radiation Therapy   Adjuvant irradiation, 50 gray in 28 fractions   03/19/2016 - 04/22/2016 Chemotherapy   Xeloda 1500 mg twice daily, started on 03/19/2016, dose reduced to 1000 mg twice daily from week 3 due to neutropenia, and patient stopped 3 days before last dose radiation due to difficulty swallowing the pill    05/20/2016 -  Adjuvant Chemotherapy   Patient declined adjuvant chemotherapy   09/16/2016 Imaging   CT CAP w Contrast 1. No evidence of local tumor recurrence at the ileocolic anastomosis. 2. No findings suspicious for metastatic disease in the chest, abdomen or pelvis. 3. Nonspecific trace free fluid in the pelvic cul-de-sac. 4. Stable solitary 3 mm right upper lobe pulmonary nodule, for which 6 month stability has been demonstrated, probably benign. 5. Additional findings include stable right posterior pericardial cyst and small calcified uterine fibroids.   05/13/2017 Imaging   CT CAP W Contrast 05/13/17 IMPRESSION: 1. No current findings of residual or recurrent malignancy. 2. Mild prominence of stool throughout the colon. Nondistended portions of the rectum. 3. Several tiny pulmonary nodules are stable from the earliest available comparison of 03/08/2016 and probably benign, but may merit surveillance. 4. Other imaging findings of potential clinical significance: Old granulomatous disease. Aortoiliac atherosclerotic vascular disease. Lumbar spondylosis and degenerative disc disease. Stable amount of trace free pelvic fluid.   04/27/2018 Imaging   04/27/2018 CT CAP IMPRESSION: Stable exam. No evidence of recurrent or metastatic carcinoma within the chest, abdomen, or pelvis   04/19/2019 Imaging   CT CAP W Contrast  IMPRESSION: Chest Impression:   1. No evidence of thoracic metastasis. 2. Stable small bilateral pulmonary nodules.   Abdomen / Pelvis Impression:   1. No evidence local colorectal carcinoma recurrence or metastasis in the  abdomen pelvis. 2. Post RIGHT hemicolectomy.   01/22/2021 Imaging   CT CAP  IMPRESSION: CT CHEST IMPRESSION   1. Similar nonspecific pulmonary nodules. 2. New posterior left upper lobe reticulonodular opacity, suspicious for interval mild infection or inflammation. 3. No thoracic adenopathy.   CT ABDOMEN AND PELVIS IMPRESSION   1. Further decrease in size of high left hepatic lobe 3 mm low-density lesion. No new or progressive metastatic disease within the abdomen or pelvis. 2. Similar trace free pelvic fluid. 3. Similar nonspecific mid rectal wall thickening. 4.  Aortic Atherosclerosis (ICD10-I70.0).   04/23/2021 Imaging   CT CAP  IMPRESSION: 1. Treated metastatic lesion between segments 2 and 3 of the liver, slightly smaller and less distinct than prior examination. No other signs of definite metastatic disease elsewhere in the abdomen or pelvis. 2. Multiple small pulmonary nodules, stable compared to the prior examination, favored to be benign. No definitive findings to suggest metastatic disease to the thorax. 3. Aortic atherosclerosis. 4. Additional incidental findings, as above.   08/13/2021 Imaging   EXAM: CT CHEST, ABDOMEN, AND PELVIS WITH CONTRAST  IMPRESSION: 1. A previously seen PET avid lesion of the anterior left lobe of the liver, hepatic segment II, is no longer discretely appreciable consistent with treatment response of a hepatic metastasis. 2. No evidence of new metastatic disease in the chest, abdomen, or pelvis. 3. Interval increase in a small focus of consolidation and nodularity of the medial  left upper lobe, consistent with minimal, ongoing atypical infection. Additional tiny bilateral pulmonary nodules are stable and almost certainly incidental benign. Attention on follow-up. 4. Status post right hemicolectomy and ileocolic anastomosis.   07/02/2022 Imaging    IMPRESSION: CHEST IMPRESSION:   1. No evidence of thoracic metastasis. 2. Stable  small pulmonary nodules.   PELVIS IMPRESSION:   1. Stable to slight decrease in size of subcapsular lesion in the RIGHT hepatic lobe. 2. No evidence of new or progressive disease in the abdomen pelvis. 3.  Aortic Atherosclerosis (ICD10-I70.0).     Rectal cancer metastasized to liver ([place])  06/15/2020 Procedure   Screening Colonoscopy by Dr [name]  IMPRESSION - Decreased sphincter tone and internal hemorrhoids that prolapse with straining, but require manual replacement into the anal canal (Grade III) found on digital rectal exam. - Patent side-to-side ileo-colonic anastomosis, characterized by healthy appearing mucosa. - The examined portion of the ileum was normal. - One diminutive polyp in the proximal transverse colon, removed with a cold biopsy forceps. Resected and retrieved. - Likely malignant partially obstructing tumor in the mid rectum. Biopsied. Tattooed. - The examination was otherwise normal on direct and retroflexion views.   06/15/2020 Initial Biopsy   Diagnosis 1. Transverse Colon Polyp - HYPERPLASTIC POLYP 2. Rectum, biopsy - ADENOCARCINOMA ARISING IN A TUBULAR ADENOMA WITH HIGH-GRADE DYSPLASIA. SEE NOTE Diagnosis Note 2. Dr. [name] reviewed the case and concurs with the diagnosis. Dr. [name] was notified on 06/16/2020.   06/28/2020 Imaging   CT CAP  IMPRESSION: 1. New low-density focus in the anterior aspect of the lateral segment LEFT hepatic lobe measuring 1.2 x 1.0 cm, compatible with small metastatic lesion in the LEFT hepatic lobe. 2. Soft tissue in the RIGHT iliac fossa following RIGHT hemicolectomy invades the psoas musculature and is slowly enlarging over time, more linear on the prior study now highly concerning for recurrence/metastasis to this location. 3. Signs of enteritis, potentially post radiation changes of the small bowel. Tethered small bowel in the RIGHT lower quadrant shows focal thickening and narrowing suspicious for small  bowel involvement and developing partial obstruction though currently contrast passes beyond this point into the colon. 4. Rectal thickening in this patient with known rectal mass as described. 5. No evidence of metastatic disease in the chest. 6. Stable small pulmonary nodules. 7.  and aortic atherosclerosis.   Aortic Atherosclerosis (ICD10-I70.0) and Emphysema (ICD10-J43.9).   07/04/2020 Initial Diagnosis   Rectal cancer metastasized to liver ([place])   07/12/2020 PET scan   IMPRESSION: 1. Exam positive for FDG avid rectal tumor which corresponds to the recent colonoscopy findings. 2. FDG avid soft tissue mass within the right iliac fossa is noted and consistent with local tumor recurrence from previous ascending colon tumor. 3. Lateral segment left lobe of liver lesion is FDG avid concerning for liver metastasis. 4. No specific findings identified to suggest metastatic disease to the chest.   08/10/2020 -  Chemotherapy   First-line FOLFIRI q2weeks starting 08/10/20. dose reduced with cycle 1. Irinotecan/5FU increased and Bevacizumab added with cycle 2 on 08/23/2020    08/17/2020 - 03/09/2022 Chemotherapy   Patient is on Treatment Plan : COLORECTAL FOLFIRI + Bevacizumab q14d     10/27/2020 Imaging   CT CAP  IMPRESSION: 1. Interval decrease in size of the hypermetabolic left hepatic lesion, consistent with metastatic disease. No new liver lesion evident. 2. Interval resolution of the hypermetabolic soft tissue lesion along the right iliac fossa with no measurable soft tissue lesion remaining at this  location today. 3. Similar appearance of soft tissue fullness in the rectum at the site of the hypermetabolic lesion seen previously. 4. Stable tiny bilateral pulmonary nodules. Continued attention on follow-up recommended. 5. Small volume free fluid in the pelvis. 6. Aortic Atherosclerosis (ICD10-I70.0).   01/22/2021 Imaging   CT CAP  IMPRESSION: CT CHEST IMPRESSION   1. Similar  nonspecific pulmonary nodules. 2. New posterior left upper lobe reticulonodular opacity, suspicious for interval mild infection or inflammation. 3. No thoracic adenopathy.   CT ABDOMEN AND PELVIS IMPRESSION   1. Further decrease in size of high left hepatic lobe 3 mm low-density lesion. No new or progressive metastatic disease within the abdomen or pelvis. 2. Similar trace free pelvic fluid. 3. Similar nonspecific mid rectal wall thickening. 4.  Aortic Atherosclerosis (ICD10-I70.0).   04/23/2021 Imaging   CT CAP  IMPRESSION: 1. Treated metastatic lesion between segments 2 and 3 of the liver, slightly smaller and less distinct than prior examination. No other signs of definite metastatic disease elsewhere in the abdomen or pelvis. 2. Multiple small pulmonary nodules, stable compared to the prior examination, favored to be benign. No definitive findings to suggest metastatic disease to the thorax. 3. Aortic atherosclerosis. 4. Additional incidental findings, as above.   08/13/2021 Imaging   EXAM: CT CHEST, ABDOMEN, AND PELVIS WITH CONTRAST  IMPRESSION: 1. A previously seen PET avid lesion of the anterior left lobe of the liver, hepatic segment II, is no longer discretely appreciable consistent with treatment response of a hepatic metastasis. 2. No evidence of new metastatic disease in the chest, abdomen, or pelvis. 3. Interval increase in a small focus of consolidation and nodularity of the medial left upper lobe, consistent with minimal, ongoing atypical infection. Additional tiny bilateral pulmonary nodules are stable and almost certainly incidental benign. Attention on follow-up. 4. Status post right hemicolectomy and ileocolic anastomosis.   03/28/2022 -  Chemotherapy   Patient is on Treatment Plan : COLORECTAL FOLFOX + Bevacizumab q14d        INTERVAL HISTORY:  Brittney Spencer is here for a follow up of  metastatic rectal cancer, h/o colon cancer    She was last  seen by me on  05/30/22 She presents to the clinic 08/21/21 She presents to the clinic alone. Pt states everything has been well. She reports she has an appetite and has gain some weight. Pt denies having diarrhea. Pt states she still avoiding cold fluids.Pt reports when she was on the reduce of chemo her symptoms were milder.   All other systems were reviewed with the patient and are negative.  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 Some residual intraretinal fluid remains, very small localized subfoveal of fluid remains although this continues to slowly resorb. We'll continue to observe.   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  Resolved March 2021 post vitrectomy    SURGICAL HISTORY: Past Surgical History:  Procedure Laterality Date   COLONOSCOPY  2018   [facility]   COLONSCOPY  12/2015   IR IMAGING GUIDED PORT INSERTION  08/04/2020   LAPAROSCOPIC RIGHT HEMI COLECTOMY Right 02/07/2016   Procedure: LAPAROSCOPIC ASSISTED RIGHT HEMI COLECTOMY AND RIGHT SALPINGO OOPHERECTOMY;  Surgeon: [name], MD;  Location: [facility];  Service: General;  Laterality: Right;   RETINAL DETACHMENT SURGERY  09/2019    I have reviewed the social history and family history with the patient and they are unchanged from previous note.  ALLERGIES:  is allergic to fish allergy, peanut-containing drug products, soy allergy, and  buspirone.  MEDICATIONS:  Current Outpatient Medications  Medication Sig Dispense Refill   capecitabine (XELODA) 500 MG tablet Take 2 tabs every 12 hours, for 14 days then off for 7 days. Take after meal 56 tablet 0   ALPRAZolam (XANAX) 0.25 MG tablet Take 1 tablet (0.25 mg total) by mouth daily as needed for anxiety. 30 tablet 0   Cholecalciferol (VITAMIN D3 PO) Take by mouth daily.     docusate sodium (COLACE) 100 MG capsule 1 capsule as needed     lidocaine-prilocaine (EMLA) cream Apply 1 application topically as needed. 30 g 1   NORVASC 2.5 MG tablet Take 1 tablet (2.5 mg total) by mouth daily. 90 tablet 1   ondansetron (ZOFRAN) 8 MG tablet Take 1 tablet (8 mg total) by mouth every 8 (eight) hours as needed for nausea or vomiting. 20 tablet 2   prochlorperazine (COMPAZINE) 10 MG tablet Take 1 tablet (10 mg total) by mouth every 6 (six) hours as needed for nausea or vomiting. 30 tablet 2   Current Facility-Administered Medications  Medication Dose Route Frequency Provider Last Rate Last Admin   0.9 %  sodium chloride infusion  500 mL Intravenous Once Danis, Estill Cotta III, MD        PHYSICAL EXAMINATION: ECOG PERFORMANCE STATUS: 1 - Symptomatic but completely ambulatory  Vitals:   07/04/22 0805  BP: 136/81  Pulse: 88  Resp: 18  Temp: 98.4 F (36.9 C)  SpO2: 100%   Wt Readings from Last 3 Encounters:  07/04/22 105 lb 8 oz (47.9 kg)  06/13/22 103 lb 1.6 oz (46.8 kg)  05/30/22 101 lb 1.6 oz (45.9 kg)     GENERAL:alert, no distress and comfortable SKIN: skin color normal, no rashes or significant lesions EYES: normal, Conjunctiva are pink and non-injected, sclera clear  NEURO: alert & oriented x 3 with fluent speech   LABORATORY DATA:  I have reviewed the data as listed    Latest Ref Rng & Units 07/02/2022    8:27 AM 06/13/2022    8:13 AM 05/30/2022    7:40 AM  CBC  WBC 4.0 - 10.5 K/uL 4.9  6.6  7.6   Hemoglobin 12.0 - 15.0 g/dL 8.9  8.7  9.0   Hematocrit 36.0 - 46.0 %  29.2  28.9  29.7   Platelets 150 - 400 K/uL 183  142  163         Latest Ref Rng & Units 07/02/2022    8:27 AM 06/13/2022    8:13 AM 05/30/2022    7:40 AM  CMP  Glucose 70 - 99 mg/dL 77  78  83   BUN 8 - 23 mg/dL _0 Creatinine 0.44 - 1.00 mg/dL 0.58  0.59  0.59   Sodium 135 - 145  mmol/L 138  137  137   Potassium 3.5 - 5.1 mmol/L 4.0  3.9  4.0   Chloride 98 - 111 mmol/L 107  106  106   CO2 22 - 32 mmol/L _0 Calcium 8.9 - 10.3 mg/dL 9.0  8.8  8.9   Total Protein 6.5 - 8.1 g/dL 6.7  6.3  6.8   Total Bilirubin 0.3 - 1.2 mg/dL 0.3  0.2  0.3   Alkaline Phos 38 - 126 U/L 74  95  88   AST 15 - 41 U/L _1 ALT 0 - 44 U/L _2 RADIOGRAPHIC STUDIES: I have personally reviewed the radiological images as listed and agreed with the findings in the report. CT CHEST ABDOMEN PELVIS W CONTRAST  Result Date: 07/03/2022 CLINICAL DATA:  Colon cancer. Assess response to treatment. Ascending colon adenocarcinoma. * Tracking Code: BO * EXAM: CT CHEST, ABDOMEN, AND PELVIS WITH CONTRAST TECHNIQUE: Multidetector CT imaging of the chest, abdomen and pelvis was performed following the standard protocol during bolus administration of intravenous contrast. RADIATION DOSE REDUCTION: This exam was performed according to the departmental dose-optimization program which includes automated exposure control, adjustment of the mA and/or kV according to patient size and/or use of iterative reconstruction technique. CONTRAST:  91m OMNIPAQUE IOHEXOL 300 MG/ML  SOLN COMPARISON:  CT 03/18/2022. FINDINGS: CT CHEST FINDINGS Cardiovascular: Port in the anterior chest wall with tip in distal SVC. No significant vascular findings. Normal heart size. No pericardial effusion. Mediastinum/Nodes: No axillary or supraclavicular adenopathy. No mediastinal or hilar adenopathy. No pericardial fluid. Esophagus normal. Lungs/Pleura: No suspicious pulmonary nodules. Stable 3 mm nodule in the LEFT upper lobe  (image 61/4). 3 mm RIGHT lobe pulmonary nodule on image 80/4 is unchanged. No new pulmonary nodules. Normal pleural. Airways normal. Musculoskeletal: No aggressive osseous lesion. CT ABDOMEN AND PELVIS FINDINGS Hepatobiliary: Hypodense subcapsular lesion in RIGHT hepatic lobe measures 21 mm by 19 mm compared to 23 mm x 23 mm. No new hepatic lesions. Pancreas: Pancreas is normal. No ductal dilatation. No pancreatic inflammation. Spleen: Normal spleen Adrenals/urinary tract: Adrenal glands and kidneys are normal. The ureters and bladder normal. Stomach/Bowel: Stomach small bowel normal. Ascending and transverse colon normal. Moderate volume stool in descending colon sigmoid colon. Distal rectum nondistended. No obstruction. Vascular/Lymphatic: Abdominal aorta is normal caliber with atherosclerotic calcification. There is no retroperitoneal or periportal lymphadenopathy. No pelvic lymphadenopathy. Reproductive: Uterus and adnexa unremarkable. Other: No free fluid. Musculoskeletal: No aggressive osseous lesion. IMPRESSION: CHEST IMPRESSION: 1. No evidence of thoracic metastasis. 2. Stable small pulmonary nodules. PELVIS IMPRESSION: 1. Stable to slight decrease in size of subcapsular lesion in the RIGHT hepatic lobe. 2. No evidence of new or progressive disease in the abdomen pelvis. 3.  Aortic Atherosclerosis (ICD10-I70.0). Electronically Signed   By: SSuzy BouchardM.D.   On: 07/03/2022 15:23      Orders Placed This Encounter  Procedures   CBC with Differential ([place])    Standing Status:   Future    Standing Expiration Date:   07/19/2023   CMP ([place])    Standing Status:   Future    Standing Expiration Date:   07/19/2023   Total Protein, Urine dipstick    Standing Status:   Future    Standing Expiration Date:   07/19/2023   All questions were answered. The patient knows to call the clinic with any  problems, questions or concerns. No barriers to learning was detected. The total  time spent in the appointment was 40 minutes.     [name], MD 07/04/2022   Felicity Coyer, CMA, am acting as scribe for [name], MD.   I have reviewed the above documentation for accuracy and completeness, and I agree with the above.

## 2022-07-03 NOTE — Assessment & Plan Note (Deleted)
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  -She began first-line chemo with dose-reduced FOLFIRI and Beva in 2/22.  -we changed to FOLFOX with continued Beva on 03/28/22. She tolerated well overall with some fatigue and cold sensitivity. -restaging CT yesterday showed overall SD with slight improvement, no new lesions. I discussed with her

## 2022-07-03 NOTE — Assessment & Plan Note (Signed)
-  code status DNR

## 2022-07-04 ENCOUNTER — Other Ambulatory Visit (HOSPITAL_COMMUNITY): Payer: Self-pay

## 2022-07-04 ENCOUNTER — Other Ambulatory Visit: Payer: Federal, State, Local not specified - PPO

## 2022-07-04 ENCOUNTER — Encounter: Payer: Self-pay | Admitting: Hematology

## 2022-07-04 ENCOUNTER — Other Ambulatory Visit: Payer: Self-pay

## 2022-07-04 ENCOUNTER — Other Ambulatory Visit: Payer: Self-pay | Admitting: *Deleted

## 2022-07-04 ENCOUNTER — Telehealth: Payer: Self-pay | Admitting: Pharmacy Technician

## 2022-07-04 ENCOUNTER — Inpatient Hospital Stay: Payer: Federal, State, Local not specified - PPO | Admitting: Hematology

## 2022-07-04 ENCOUNTER — Inpatient Hospital Stay: Payer: Federal, State, Local not specified - PPO

## 2022-07-04 ENCOUNTER — Telehealth: Payer: Self-pay | Admitting: Pharmacist

## 2022-07-04 VITALS — BP 136/81 | HR 88 | Temp 98.4°F | Resp 18 | Ht 66.0 in | Wt 105.5 lb

## 2022-07-04 DIAGNOSIS — Z5189 Encounter for other specified aftercare: Secondary | ICD-10-CM | POA: Diagnosis present

## 2022-07-04 DIAGNOSIS — Z5111 Encounter for antineoplastic chemotherapy: Secondary | ICD-10-CM | POA: Diagnosis not present

## 2022-07-04 DIAGNOSIS — Z5112 Encounter for antineoplastic immunotherapy: Secondary | ICD-10-CM | POA: Diagnosis present

## 2022-07-04 DIAGNOSIS — T451X5D Adverse effect of antineoplastic and immunosuppressive drugs, subsequent encounter: Secondary | ICD-10-CM | POA: Diagnosis not present

## 2022-07-04 DIAGNOSIS — Z9221 Personal history of antineoplastic chemotherapy: Secondary | ICD-10-CM | POA: Diagnosis not present

## 2022-07-04 DIAGNOSIS — C787 Secondary malignant neoplasm of liver and intrahepatic bile duct: Secondary | ICD-10-CM

## 2022-07-04 DIAGNOSIS — Z923 Personal history of irradiation: Secondary | ICD-10-CM | POA: Diagnosis not present

## 2022-07-04 DIAGNOSIS — Z66 Do not resuscitate: Secondary | ICD-10-CM

## 2022-07-04 DIAGNOSIS — I10 Essential (primary) hypertension: Secondary | ICD-10-CM

## 2022-07-04 DIAGNOSIS — C20 Malignant neoplasm of rectum: Secondary | ICD-10-CM | POA: Diagnosis not present

## 2022-07-04 DIAGNOSIS — C182 Malignant neoplasm of ascending colon: Secondary | ICD-10-CM

## 2022-07-04 DIAGNOSIS — F419 Anxiety disorder, unspecified: Secondary | ICD-10-CM | POA: Diagnosis not present

## 2022-07-04 DIAGNOSIS — Z79899 Other long term (current) drug therapy: Secondary | ICD-10-CM | POA: Diagnosis not present

## 2022-07-04 DIAGNOSIS — Z1509 Genetic susceptibility to other malignant neoplasm: Secondary | ICD-10-CM | POA: Diagnosis not present

## 2022-07-04 DIAGNOSIS — D6481 Anemia due to antineoplastic chemotherapy: Secondary | ICD-10-CM | POA: Diagnosis not present

## 2022-07-04 LAB — TOTAL PROTEIN, URINE DIPSTICK: Protein, ur: NEGATIVE mg/dL

## 2022-07-04 MED ORDER — PALONOSETRON HCL INJECTION 0.25 MG/5ML
0.2500 mg | Freq: Once | INTRAVENOUS | Status: AC
  Administered 2022-07-04: 0.25 mg via INTRAVENOUS
  Filled 2022-07-04: qty 5

## 2022-07-04 MED ORDER — DEXTROSE 5 % IV SOLN: Freq: Once | INTRAVENOUS | Status: AC

## 2022-07-04 MED ORDER — SODIUM CHLORIDE 0.9% FLUSH: 10.0000 mL | INTRAVENOUS | Status: DC | PRN

## 2022-07-04 MED ORDER — SODIUM CHLORIDE 0.9 % IV SOLN: Freq: Once | INTRAVENOUS | Status: AC

## 2022-07-04 MED ORDER — OXALIPLATIN CHEMO INJECTION 100 MG/20ML
70.0000 mg/m2 | Freq: Once | INTRAVENOUS | Status: AC
  Administered 2022-07-04: 105 mg via INTRAVENOUS
  Filled 2022-07-04: qty 21

## 2022-07-04 MED ORDER — SODIUM CHLORIDE 0.9 % IV SOLN
1800.0000 mg/m2 | INTRAVENOUS | Status: DC
  Administered 2022-07-04: 2700 mg via INTRAVENOUS
  Filled 2022-07-04: qty 54

## 2022-07-04 MED ORDER — CAPECITABINE 500 MG PO TABS
ORAL_TABLET | ORAL | 0 refills | Status: DC
  Filled 2022-07-04: qty 56, fill #0

## 2022-07-04 MED ORDER — DEXTROSE 5 % IV SOLN: Freq: Once | INTRAVENOUS | Status: DC

## 2022-07-04 MED ORDER — SODIUM CHLORIDE 0.9 % IV SOLN
10.0000 mg | Freq: Once | INTRAVENOUS | Status: AC
  Administered 2022-07-04: 10 mg via INTRAVENOUS
  Filled 2022-07-04: qty 10

## 2022-07-04 MED ORDER — SODIUM CHLORIDE 0.9 % IV SOLN
5.0000 mg/kg | Freq: Once | INTRAVENOUS | Status: AC
  Administered 2022-07-04: 250 mg via INTRAVENOUS
  Filled 2022-07-04: qty 10

## 2022-07-04 MED ORDER — LEUCOVORIN CALCIUM INJECTION 350 MG
400.0000 mg/m2 | Freq: Once | INTRAVENOUS | Status: AC
  Administered 2022-07-04: 600 mg via INTRAVENOUS
  Filled 2022-07-04: qty 30

## 2022-07-04 MED ORDER — HEPARIN SOD (PORK) LOCK FLUSH 100 UNIT/ML IV SOLN: 500.0000 [IU] | Freq: Once | INTRAVENOUS | Status: DC | PRN

## 2022-07-04 MED ORDER — CAPECITABINE 500 MG PO TABS: ORAL_TABLET | ORAL | 0 refills | Status: DC

## 2022-07-04 NOTE — Telephone Encounter (Addendum)
Oral Oncology Pharmacist Encounter  Received new prescription for Xeloda (capecitabine) for the treatment of metastatic rectal cancer in conjunction with oxaliplatin and bevacizumab, planned duration until disease progression or unacceptable drug toxicity. Planned start date of Xeloda is 07/18/22.  CBC w/ Diff and CMP from 07/02/22 assessed, no relevant lab abnormalities requiring baseline dose adjustment required at this time. Confirmed with Dr. Burr Medico plan to update Xeloda Rx to 2 tabs AM/3 tabs PM for 14 days on/7 off. Updated Rx sent to [facility] dispensing.   Current medication list in Epic reviewed, no relevant/significant DDIs with Xeloda identified.  Evaluated chart and no patient barriers to medication adherence noted.   Patient agreement for treatment documented in MD note on 07/04/22.  Oral Oncology Clinic will continue to follow for insurance authorization, copayment issues, initial counseling and start date.  Leron Croak, PharmD, BCPS, BCOP Hematology/Oncology Clinical Pharmacist [facility] and [facility] [phone] 07/04/2022 9:22 AM

## 2022-07-04 NOTE — Progress Notes (Signed)
Confirmed w/ Dr. Burr Medico will give full dose oxaliplatin.  Larene Beach, PharmD

## 2022-07-04 NOTE — Telephone Encounter (Signed)
Oral Oncology Patient Advocate Encounter  After completing a benefits investigation, prior authorization for Capecitabine is not required at this time through [place].  Patient's copay is $42.72.    Patient may fill one time at [facility], then they are required to get refills with [facility].  [name], CPhT-Adv Oncology Pharmacy Patient Brittney Spencer Direct Number: [phone]  Fax: [phone]

## 2022-07-04 NOTE — Patient Instructions (Signed)
[facility]  Discharge Instructions: Thank you for choosing [facility] to provide your oncology and hematology care.   If you have a lab appointment with the [facility], please go directly to the [facility] and check in at the registration area.   Wear comfortable clothing and clothing appropriate for easy access to any Portacath or PICC line.   We strive to give you quality time with your provider. You may need to reschedule your appointment if you arrive late (15 or more minutes).  Arriving late affects you and other patients whose appointments are after yours.  Also, if you miss three or more appointments without notifying the office, you may be dismissed from the clinic at the provider's discretion.      For prescription refill requests, have your pharmacy contact our office and allow 72 hours for refills to be completed.    Today you received the following chemotherapy and/or immunotherapy agents: Bevacizumab, Oxaliplatin, Leucovorin, and Fluorouracil.    To help prevent nausea and vomiting after your treatment, we encourage you to take your nausea medication as directed.  BELOW ARE SYMPTOMS THAT SHOULD BE REPORTED IMMEDIATELY: *FEVER GREATER THAN 100.4 F (38 C) OR HIGHER *CHILLS OR SWEATING *NAUSEA AND VOMITING THAT IS NOT CONTROLLED WITH YOUR NAUSEA MEDICATION *UNUSUAL SHORTNESS OF BREATH *UNUSUAL BRUISING OR BLEEDING *URINARY PROBLEMS (pain or burning when urinating, or frequent urination) *BOWEL PROBLEMS (unusual diarrhea, constipation, pain near the anus) TENDERNESS IN MOUTH AND THROAT WITH OR WITHOUT PRESENCE OF ULCERS (sore throat, sores in mouth, or a toothache) UNUSUAL RASH, SWELLING OR PAIN  UNUSUAL VAGINAL DISCHARGE OR ITCHING   Items with * indicate a potential emergency and should be followed up as soon as possible or go to the Emergency Department if any problems should occur.  Please show the CHEMOTHERAPY ALERT CARD  or IMMUNOTHERAPY ALERT CARD at check-in to the Emergency Department and triage nurse.  Should you have questions after your visit or need to cancel or reschedule your appointment, please contact [facility]  Dept: [phone]  and follow the prompts.  Office hours are 8:00 a.m. to 4:30 p.m. Monday - Friday. Please note that voicemails left after 4:00 p.m. may not be returned until the following business day.  We are closed weekends and major holidays. You have access to a nurse at all times for urgent questions. Please call the main number to the clinic Dept: [phone] and follow the prompts.   For any non-urgent questions, you may also contact your provider using MyChart. We now offer e-Visits for anyone 54 and older to request care online for non-urgent symptoms. For details visit [email or web address].   Also download the MyChart app! Go to the app store, search "MyChart", open the app, select [facility], and log in with your MyChart username and password.  Masks are optional in the cancer centers. If you would like for your care team to wear a mask while they are taking care of you, please let them know. You may have one support person who is at least 74 years old accompany you for your appointments. The chemotherapy medication bag should finish at 46 hours, 96 hours, or 7 days. For example, if your pump is scheduled for 46 hours and it was put on at 4:00 p.m., it should finish at 2:00 p.m. the day it is scheduled to come off regardless of your appointment time.     Estimated time to finish at:  11: 15 am   If the display on your pump reads "Low Volume" and it is beeping, take the batteries out of the pump and come to the cancer center for it to be taken off.   If the pump alarms go off prior to the pump reading "Low Volume" then call [phone] and someone can assist you.  If the plunger comes out and the chemotherapy medication is leaking out, please  use your home chemo spill kit to clean up the spill. Do NOT use paper towels or other household products.  If you have problems or questions regarding your pump, please call either [phone] (24 hours a day) or the cancer center Monday-Friday 8:00 a.m.- 4:30 p.m. at the clinic number and we will assist you. If you are unable to get assistance, then go to the nearest Emergency Department and ask the staff to contact the IV team for assistance.

## 2022-07-04 NOTE — Progress Notes (Signed)
Ok to proceed with treatment without urine protein results per Dr. Burr Medico.  Urine protein resulted- negative.

## 2022-07-06 ENCOUNTER — Inpatient Hospital Stay: Payer: Federal, State, Local not specified - PPO

## 2022-07-06 VITALS — BP 155/78 | HR 72 | Temp 97.7°F | Resp 16 | Ht 66.0 in

## 2022-07-06 DIAGNOSIS — C20 Malignant neoplasm of rectum: Secondary | ICD-10-CM

## 2022-07-06 DIAGNOSIS — Z5112 Encounter for antineoplastic immunotherapy: Secondary | ICD-10-CM | POA: Diagnosis not present

## 2022-07-06 MED ORDER — SODIUM CHLORIDE 0.9% FLUSH
10.0000 mL | INTRAVENOUS | Status: DC | PRN
  Administered 2022-07-06: 10 mL

## 2022-07-06 MED ORDER — PEGFILGRASTIM-JMDB 6 MG/0.6ML ~~LOC~~ SOSY
6.0000 mg | PREFILLED_SYRINGE | Freq: Once | SUBCUTANEOUS | Status: AC
  Administered 2022-07-06: 6 mg via SUBCUTANEOUS
  Filled 2022-07-06: qty 0.6

## 2022-07-06 MED ORDER — HEPARIN SOD (PORK) LOCK FLUSH 100 UNIT/ML IV SOLN
500.0000 [IU] | Freq: Once | INTRAVENOUS | Status: AC | PRN
  Administered 2022-07-06: 500 [IU]

## 2022-07-06 NOTE — Patient Instructions (Signed)

## 2022-07-09 NOTE — Telephone Encounter (Addendum)
Oral Chemotherapy Pharmacist Encounter   I followed up with patient today to provide her with the phone number to call and set up shipment with [facility]. Patient informed me that she would rather not change her therapy at this time and would prefer to remain on the 5-FU pump instead of switch to Xeloda tablets. I inquired if patient had any drug-related concerns about the Xeloda that I could help discuss with her, but she felt that she has more freedom to continue with the 5-FU pump as is instead of taking the Xeloda at home for 14 days on, 7 off.  Sent staff message to Dr. Burr Medico about the above conversation. Oral chemotherapy clinic will stop following at this time.   Leron Croak, PharmD, BCPS, [facility] Hematology/Oncology Clinical Pharmacist [facility] [phone] 07/09/2022 1:37 PM

## 2022-07-16 ENCOUNTER — Other Ambulatory Visit: Payer: Self-pay | Admitting: Hematology

## 2022-07-16 ENCOUNTER — Telehealth: Payer: Self-pay | Admitting: Hematology

## 2022-07-16 DIAGNOSIS — C20 Malignant neoplasm of rectum: Secondary | ICD-10-CM

## 2022-07-16 NOTE — Telephone Encounter (Signed)
Left patient a vm regarding all upcoming appointments  

## 2022-07-16 NOTE — Addendum Note (Signed)
Addended byBritt Boozer on: 07/16/2022 11:14 AM   Modules accepted: Orders

## 2022-07-16 NOTE — Progress Notes (Signed)
[facility]   [phone] [phone]   Clinic Follow up Note   Patient Care Team: Patient, No Pcp Per as PCP - General (General Practice) [name], NP as Nurse Practitioner (Oncology) [name], MD as Consulting Physician (Oncology) [name], RN (Inactive) as Oncology Nurse Navigator  Date of Service:  07/18/2022  CHIEF COMPLAINT: f/u of metastatic rectal cancer, h/o colon cancer      CURRENT THERAPY: FOLFOX with bevacizumab, q14d, starting 03/28/22        ASSESSMENT:  Brittney Spencer is a 72 y.o. 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  -She began first-line chemo with dose-reduced FOLFIRI and Beva in 2/22.  -we changed to FOLFOX with continued Beva on 03/28/22. She tolerated well overall with some fatigue and cold sensitivity. -restaging CT on 07/02/2022 showed overall SD with slight improvement, no new lesions. Will continue current chemo   Cancer of ascending colon ([place]) diagnosed in 12/2015. S/p right hemicolectomy with right salpingo oophorectomy and adjuvant ChemoRT. 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  Iron deficiency anemia due to chronic blood loss -will monitor iron level  -no previous iv iron    PLAN: -lab reviewed -proceed with  C8 FOLFOX+ Beva labs adequate for treatment -lab,f/u and folfox+beva 08/01/2022  SUMMARY OF ONCOLOGIC HISTORY: Oncology History Overview Note  Cancer Staging Cancer of ascending colon [facility]) Staging form: Colon and Rectum, AJCC 7th Edition - Clinical stage from 02/07/2016: Stage IIIC (T4b, N1b, M0) - Signed by [name], MD on  03/04/2016 Laterality: Right Residual tumor (R): R2 - Macroscopic    Cancer of ascending colon ([place])  10/25/2015 Imaging   CT ABD/PELVIS:  Inflammatory changes inferior to the cecal tip appear improved, there is still irregular soft tissue thickening of the cecal tip, and there are adjacent prominent lymph nodes in the ileocolonic mesentery, measuring 13 mm on image 49 and 8 mm on image 52. In addition, there is a 2.5 x 1.8 cm nodule on image 46 which has central low density. Therefore, these findings are moderately suspicious for an underlying cecal malignancy with perforation.    01/19/2016 Procedure   COLONOSCOPY per Dr. [name]: Fungating, ulcerated mass almost obstructing mid ascending colon   01/19/2016 Initial Biopsy   Diagnosis Surgical [P], cecal mass - INVASIVE ADENOCARCINOMA WITH ULCERATION. - SEE COMMENT.   02/05/2016 Tumor Marker   Patient's tumor was tested for the following markers: CEA Results of the tumor marker test revealed 5.7.   02/07/2016 Initial Diagnosis   Cancer of ascending colon ([place])   02/07/2016 Definitive Surgery   Laparoscopic assisted right hemicolectomy and right salpingo oopherectomy--Dr. [name]   02/07/2016 Pathologic Stage   p T4 N1b   2/43 nodes +   02/07/2016 Pathology Results   MMR normal; G2 adenocarcinoma;proximal & distal margins negative; soft tissue mass on pelvic sidewall + for adenocarcinoma with positive margin MSI Stable   03/08/2016 Imaging   CT chest negative for metastasis.    03/19/2016 - 04/25/2016 Radiation Therapy   Adjuvant irradiation, 50 gray in 28 fractions   03/19/2016 - 04/22/2016 Chemotherapy   Xeloda 1500 mg twice daily, started on  03/19/2016, dose reduced to 1000 mg twice daily from week 3 due to neutropenia, and patient stopped 3 days before last dose radiation due to difficulty swallowing the pill    05/20/2016 -  Adjuvant Chemotherapy   Patient declined adjuvant chemotherapy   09/16/2016 Imaging   CT CAP w  Contrast 1. No evidence of local tumor recurrence at the ileocolic anastomosis. 2. No findings suspicious for metastatic disease in the chest, abdomen or pelvis. 3. Nonspecific trace free fluid in the pelvic cul-de-sac. 4. Stable solitary 3 mm right upper lobe pulmonary nodule, for which 6 month stability has been demonstrated, probably benign. 5. Additional findings include stable right posterior pericardial cyst and small calcified uterine fibroids.   05/13/2017 Imaging   CT CAP W Contrast 05/13/17 IMPRESSION: 1. No current findings of residual or recurrent malignancy. 2. Mild prominence of stool throughout the colon. Nondistended portions of the rectum. 3. Several tiny pulmonary nodules are stable from the earliest available comparison of 03/08/2016 and probably benign, but may merit surveillance. 4. Other imaging findings of potential clinical significance: Old granulomatous disease. Aortoiliac atherosclerotic vascular disease. Lumbar spondylosis and degenerative disc disease. Stable amount of trace free pelvic fluid.   04/27/2018 Imaging   04/27/2018 CT CAP IMPRESSION: Stable exam. No evidence of recurrent or metastatic carcinoma within the chest, abdomen, or pelvis   04/19/2019 Imaging   CT CAP W Contrast  IMPRESSION: Chest Impression:   1. No evidence of thoracic metastasis. 2. Stable small bilateral pulmonary nodules.   Abdomen / Pelvis Impression:   1. No evidence local colorectal carcinoma recurrence or metastasis in the abdomen pelvis. 2. Post RIGHT hemicolectomy.   01/22/2021 Imaging   CT CAP  IMPRESSION: CT CHEST IMPRESSION   1. Similar nonspecific pulmonary nodules. 2. New posterior left upper lobe reticulonodular opacity, suspicious for interval mild infection or inflammation. 3. No thoracic adenopathy.   CT ABDOMEN AND PELVIS IMPRESSION   1. Further decrease in size of high left hepatic lobe 3 mm low-density lesion. No new or progressive  metastatic disease within the abdomen or pelvis. 2. Similar trace free pelvic fluid. 3. Similar nonspecific mid rectal wall thickening. 4.  Aortic Atherosclerosis (ICD10-I70.0).   04/23/2021 Imaging   CT CAP  IMPRESSION: 1. Treated metastatic lesion between segments 2 and 3 of the liver, slightly smaller and less distinct than prior examination. No other signs of definite metastatic disease elsewhere in the abdomen or pelvis. 2. Multiple small pulmonary nodules, stable compared to the prior examination, favored to be benign. No definitive findings to suggest metastatic disease to the thorax. 3. Aortic atherosclerosis. 4. Additional incidental findings, as above.   08/13/2021 Imaging   EXAM: CT CHEST, ABDOMEN, AND PELVIS WITH CONTRAST  IMPRESSION: 1. A previously seen PET avid lesion of the anterior left lobe of the liver, hepatic segment II, is no longer discretely appreciable consistent with treatment response of a hepatic metastasis. 2. No evidence of new metastatic disease in the chest, abdomen, or pelvis. 3. Interval increase in a small focus of consolidation and nodularity of the medial left upper lobe, consistent with minimal, ongoing atypical infection. Additional tiny bilateral pulmonary nodules are stable and almost certainly incidental benign. Attention on follow-up. 4. Status post right hemicolectomy and ileocolic anastomosis.   07/02/2022 Imaging    IMPRESSION: CHEST IMPRESSION:   1. No evidence of thoracic metastasis. 2. Stable small pulmonary nodules.   PELVIS IMPRESSION:   1. Stable to slight decrease in size of subcapsular lesion in the RIGHT hepatic  lobe. 2. No evidence of new or progressive disease in the abdomen pelvis. 3.  Aortic Atherosclerosis (ICD10-I70.0).     Rectal cancer metastasized to liver ([place])  06/15/2020 Procedure   Screening Colonoscopy by Dr [name]  IMPRESSION - Decreased sphincter tone and internal hemorrhoids that prolapse with  straining, but require manual replacement into the anal canal (Grade III) found on digital rectal exam. - Patent side-to-side ileo-colonic anastomosis, characterized by healthy appearing mucosa. - The examined portion of the ileum was normal. - One diminutive polyp in the proximal transverse colon, removed with a cold biopsy forceps. Resected and retrieved. - Likely malignant partially obstructing tumor in the mid rectum. Biopsied. Tattooed. - The examination was otherwise normal on direct and retroflexion views.   06/15/2020 Initial Biopsy   Diagnosis 1. Transverse Colon Polyp - HYPERPLASTIC POLYP 2. Rectum, biopsy - ADENOCARCINOMA ARISING IN A TUBULAR ADENOMA WITH HIGH-GRADE DYSPLASIA. SEE NOTE Diagnosis Note 2. Dr. [name] reviewed the case and concurs with the diagnosis. Dr. [name] was notified on 06/16/2020.   06/28/2020 Imaging   CT CAP  IMPRESSION: 1. New low-density focus in the anterior aspect of the lateral segment LEFT hepatic lobe measuring 1.2 x 1.0 cm, compatible with small metastatic lesion in the LEFT hepatic lobe. 2. Soft tissue in the RIGHT iliac fossa following RIGHT hemicolectomy invades the psoas musculature and is slowly enlarging over time, more linear on the prior study now highly concerning for recurrence/metastasis to this location. 3. Signs of enteritis, potentially post radiation changes of the small bowel. Tethered small bowel in the RIGHT lower quadrant shows focal thickening and narrowing suspicious for small bowel involvement and developing partial obstruction though currently contrast passes beyond this point into the colon. 4. Rectal thickening in this patient with known rectal mass as described. 5. No evidence of metastatic disease in the chest. 6. Stable small pulmonary nodules. 7.  and aortic atherosclerosis.   Aortic Atherosclerosis (ICD10-I70.0) and Emphysema (ICD10-J43.9).   07/04/2020 Initial Diagnosis   Rectal cancer metastasized to  liver ([place])   07/12/2020 PET scan   IMPRESSION: 1. Exam positive for FDG avid rectal tumor which corresponds to the recent colonoscopy findings. 2. FDG avid soft tissue mass within the right iliac fossa is noted and consistent with local tumor recurrence from previous ascending colon tumor. 3. Lateral segment left lobe of liver lesion is FDG avid concerning for liver metastasis. 4. No specific findings identified to suggest metastatic disease to the chest.   08/10/2020 -  Chemotherapy   First-line FOLFIRI q2weeks starting 08/10/20. dose reduced with cycle 1. Irinotecan/5FU increased and Bevacizumab added with cycle 2 on 08/23/2020    08/17/2020 - 03/09/2022 Chemotherapy   Patient is on Treatment Plan : COLORECTAL FOLFIRI + Bevacizumab q14d     10/27/2020 Imaging   CT CAP  IMPRESSION: 1. Interval decrease in size of the hypermetabolic left hepatic lesion, consistent with metastatic disease. No new liver lesion evident. 2. Interval resolution of the hypermetabolic soft tissue lesion along the right iliac fossa with no measurable soft tissue lesion remaining at this location today. 3. Similar appearance of soft tissue fullness in the rectum at the site of the hypermetabolic lesion seen previously. 4. Stable tiny bilateral pulmonary nodules. Continued attention on follow-up recommended. 5. Small volume free fluid in the pelvis. 6. Aortic Atherosclerosis (ICD10-I70.0).   01/22/2021 Imaging   CT CAP  IMPRESSION: CT CHEST IMPRESSION   1. Similar nonspecific pulmonary nodules. 2. New posterior left upper lobe reticulonodular opacity, suspicious for interval  mild infection or inflammation. 3. No thoracic adenopathy.   CT ABDOMEN AND PELVIS IMPRESSION   1. Further decrease in size of high left hepatic lobe 3 mm low-density lesion. No new or progressive metastatic disease within the abdomen or pelvis. 2. Similar trace free pelvic fluid. 3. Similar nonspecific mid rectal wall  thickening. 4.  Aortic Atherosclerosis (ICD10-I70.0).   04/23/2021 Imaging   CT CAP  IMPRESSION: 1. Treated metastatic lesion between segments 2 and 3 of the liver, slightly smaller and less distinct than prior examination. No other signs of definite metastatic disease elsewhere in the abdomen or pelvis. 2. Multiple small pulmonary nodules, stable compared to the prior examination, favored to be benign. No definitive findings to suggest metastatic disease to the thorax. 3. Aortic atherosclerosis. 4. Additional incidental findings, as above.   08/13/2021 Imaging   EXAM: CT CHEST, ABDOMEN, AND PELVIS WITH CONTRAST  IMPRESSION: 1. A previously seen PET avid lesion of the anterior left lobe of the liver, hepatic segment II, is no longer discretely appreciable consistent with treatment response of a hepatic metastasis. 2. No evidence of new metastatic disease in the chest, abdomen, or pelvis. 3. Interval increase in a small focus of consolidation and nodularity of the medial left upper lobe, consistent with minimal, ongoing atypical infection. Additional tiny bilateral pulmonary nodules are stable and almost certainly incidental benign. Attention on follow-up. 4. Status post right hemicolectomy and ileocolic anastomosis.   03/28/2022 -  Chemotherapy   Patient is on Treatment Plan : COLORECTAL FOLFOX + Bevacizumab q14d        INTERVAL HISTORY:  Brittney Spencer is here for a follow up of metastatic rectal cancer, h/o colon cancer.  She was last seen by me on 07/04/2022  She presents to the clinic alone. Pt stated she is doing well.  She Opted out taking CAPOX . Pt stated after last treatment she didn't have an appetite.Denies having diarrhea.Pt also said it took her longer to get  back to her routine, than normal, after last treatment. She has some occasional nausea, depending on what she is eating or drinking.    All other systems were reviewed with the patient and are  negative.  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 Some residual intraretinal fluid remains, very small localized subfoveal of fluid remains although this continues to slowly resorb. We'll continue to observe.   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   Resolved March 2021 post vitrectomy    SURGICAL HISTORY: Past Surgical History:  Procedure Laterality Date   COLONOSCOPY  2018   [facility]   COLONSCOPY  12/2015   IR IMAGING GUIDED PORT INSERTION  08/04/2020   LAPAROSCOPIC RIGHT HEMI COLECTOMY Right 02/07/2016   Procedure: LAPAROSCOPIC ASSISTED RIGHT HEMI COLECTOMY AND RIGHT SALPINGO OOPHERECTOMY;  Surgeon: [name], MD;  Location: [facility];  Service: General;  Laterality: Right;   RETINAL DETACHMENT SURGERY  09/2019    I have reviewed the social history and family history with the patient and they are unchanged from previous note.  ALLERGIES:  is allergic to fish allergy, peanut-containing drug products, soy allergy, and buspirone.  MEDICATIONS:  Current Outpatient Medications  Medication Sig Dispense Refill   ALPRAZolam (XANAX) 0.25 MG tablet Take 1 tablet (0.25 mg total) by mouth daily as needed for anxiety. 30 tablet 0   Cholecalciferol (VITAMIN D3 PO) Take by mouth daily.     docusate sodium (COLACE) 100 MG capsule 1 capsule as needed     lidocaine-prilocaine (EMLA) cream Apply 1 application  topically as needed. 30 g 1   NORVASC 2.5 MG tablet Take 1 tablet (2.5 mg total) by mouth daily. 90 tablet 1   ondansetron (ZOFRAN) 8 MG tablet Take 1 tablet (8 mg total) by mouth every 8 (eight) hours as needed for nausea or vomiting. 20 tablet 2   prochlorperazine (COMPAZINE) 10 MG tablet Take 1 tablet (10 mg total) by mouth every 6 (six) hours as needed for nausea or vomiting. 30 tablet 2   Current Facility-Administered Medications  Medication Dose Route Frequency Provider Last Rate Last Admin   0.9 %  sodium chloride infusion  500 mL Intravenous Once Doran Stabler, MD       Facility-Administered Medications Ordered in Other Visits  Medication Dose Route Frequency Provider Last Rate Last Admin   fluorouracil (ADRUCIL) 2,700 mg in sodium chloride 0.9 % 96 mL chemo infusion  1,800 mg/m2 (Order-Specific) Intravenous 1 day or 1 dose [name], MD       heparin lock flush 100 unit/mL  500 Units Intracatheter Once PRN [name], MD       leucovorin 600 mg in dextrose 5 % 250 mL infusion  400 mg/m2 (Order-Specific) Intravenous Once [name], MD 140 mL/hr at 07/18/22 1003 600 mg at 07/18/22 1003   oxaliplatin (ELOXATIN) 105 mg in dextrose 5 % 500 mL chemo infusion  70 mg/m2 (Order-Specific) Intravenous Once [name], MD 261 mL/hr at 07/18/22 1007 105 mg at 07/18/22 1007   sodium chloride flush (NS) 0.9 % injection 10 mL  10 mL Intracatheter PRN [name], MD        PHYSICAL EXAMINATION: ECOG PERFORMANCE STATUS: 1 - Symptomatic but completely ambulatory  Vitals:   07/18/22 0753  BP: (!) 142/82  Pulse: 87  Resp: 15  Temp: 98.9 F (37.2 C)  SpO2: 100%   Wt Readings from Last 3 Encounters:  07/18/22 106 lb (48.1 kg)  07/04/22 105 lb 8 oz (47.9 kg)  06/13/22 103 lb 1.6 oz (46.8 kg)     GENERAL:alert, no distress and comfortable SKIN: skin color normal, no rashes or significant lesions EYES: normal, Conjunctiva are pink and non-injected, sclera clear  NEURO: alert & oriented x 3  with fluent speech  LABORATORY DATA:  I have reviewed the data as listed    Latest Ref Rng & Units 07/18/2022    7:12 AM 07/02/2022    8:27 AM 06/13/2022    8:13 AM  CBC  WBC 4.0 - 10.5 K/uL 5.2  4.9  6.6   Hemoglobin 12.0 - 15.0 g/dL 8.7  8.9  8.7   Hematocrit 36.0 - 46.0 % 28.3  29.2  28.9   Platelets 150 - 400 K/uL 128  183  142         Latest Ref Rng & Units  07/18/2022    7:12 AM 07/02/2022    8:27 AM 06/13/2022    8:13 AM  CMP  Glucose 70 - 99 mg/dL 81  77  78   BUN 8 - 23 mg/dL '9  13  10   '$ Creatinine 0.44 - 1.00 mg/dL 0.68  0.58  0.59   Sodium 135 - 145 mmol/L 138  138  137   Potassium 3.5 - 5.1 mmol/L 4.2  4.0  3.9   Chloride 98 - 111 mmol/L 108  107  106   CO2 22 - 32 mmol/L '27  26  27   '$ Calcium 8.9 - 10.3 mg/dL 8.7  9.0  8.8   Total Protein 6.5 - 8.1 g/dL 6.3  6.7  6.3   Total Bilirubin 0.3 - 1.2 mg/dL 0.3  0.3  0.2   Alkaline Phos 38 - 126 U/L 89  74  95   AST 15 - 41 U/L '20  23  16   '$ ALT 0 - 44 U/L '9  12  7       '$ RADIOGRAPHIC STUDIES: I have personally reviewed the radiological images as listed and agreed with the findings in the report. No results found.    Orders Placed This Encounter  Procedures   CBC with Differential ([facility] Only)    Standing Status:   Future    Standing Expiration Date:   08/16/2023   CMP ([facility] only)    Standing Status:   Future    Standing Expiration Date:   08/16/2023   Total Protein, Urine dipstick    Standing Status:   Future    Standing Expiration Date:   08/16/2023   Ferritin    Standing Status:   Future    Standing Expiration Date:   07/19/2023   Iron and Iron Binding Capacity ([facility] only)    Standing Status:   Future    Standing Expiration Date:   07/19/2023   Do not attempt resuscitation (DNR)    Order Specific Question:   If patient has no pulse and is not breathing    Answer:   Do Not Attempt Resuscitation    Order Specific Question:   If patient has a pulse and/or is breathing: Medical Treatment  Goals    Answer:   LIMITED ADDITIONAL INTERVENTIONS: Use medication/IV fluids and cardiac monitoring as indicated; Do not use intubation or mechanical ventilation (DNI), also provide comfort medications.  Transfer to Progressive/Stepdown as indicated, avoid Intensive Care.    Order Specific Question:   Consent:    Answer:   Discussion documented in EHR or advanced directives reviewed   All questions were answered. The patient knows to call the clinic with any problems, questions or concerns. No barriers to learning was detected. The total time spent in the appointment was 30 minutes.     [name], MD 07/18/2022   Felicity Coyer, CMA, am acting as scribe for [name], MD.   I have reviewed the above documentation for accuracy and completeness, and I agree with the above.

## 2022-07-17 ENCOUNTER — Other Ambulatory Visit: Payer: Self-pay

## 2022-07-18 ENCOUNTER — Other Ambulatory Visit: Payer: Self-pay

## 2022-07-18 ENCOUNTER — Inpatient Hospital Stay (HOSPITAL_BASED_OUTPATIENT_CLINIC_OR_DEPARTMENT_OTHER): Payer: Federal, State, Local not specified - PPO | Admitting: Hematology

## 2022-07-18 ENCOUNTER — Inpatient Hospital Stay: Payer: Federal, State, Local not specified - PPO

## 2022-07-18 ENCOUNTER — Encounter: Payer: Self-pay | Admitting: Hematology

## 2022-07-18 VITALS — BP 142/82 | HR 87 | Temp 98.9°F | Resp 15 | Ht 66.0 in | Wt 106.0 lb

## 2022-07-18 DIAGNOSIS — D500 Iron deficiency anemia secondary to blood loss (chronic): Secondary | ICD-10-CM

## 2022-07-18 DIAGNOSIS — C20 Malignant neoplasm of rectum: Secondary | ICD-10-CM

## 2022-07-18 DIAGNOSIS — C787 Secondary malignant neoplasm of liver and intrahepatic bile duct: Secondary | ICD-10-CM

## 2022-07-18 DIAGNOSIS — C182 Malignant neoplasm of ascending colon: Secondary | ICD-10-CM | POA: Diagnosis not present

## 2022-07-18 DIAGNOSIS — Z5112 Encounter for antineoplastic immunotherapy: Secondary | ICD-10-CM | POA: Diagnosis not present

## 2022-07-18 DIAGNOSIS — Z95828 Presence of other vascular implants and grafts: Secondary | ICD-10-CM

## 2022-07-18 LAB — CBC WITH DIFFERENTIAL (CANCER CENTER ONLY)
Abs Immature Granulocytes: 0.04 10*3/uL (ref 0.00–0.07)
Basophils Absolute: 0 10*3/uL (ref 0.0–0.1)
Basophils Relative: 0 %
Eosinophils Absolute: 0.1 10*3/uL (ref 0.0–0.5)
Eosinophils Relative: 1 %
HCT: 28.3 % — ABNORMAL LOW (ref 36.0–46.0)
Hemoglobin: 8.7 g/dL — ABNORMAL LOW (ref 12.0–15.0)
Immature Granulocytes: 1 %
Lymphocytes Relative: 14 %
Lymphs Abs: 0.7 10*3/uL (ref 0.7–4.0)
MCH: 25.4 pg — ABNORMAL LOW (ref 26.0–34.0)
MCHC: 30.7 g/dL (ref 30.0–36.0)
MCV: 82.5 fL (ref 80.0–100.0)
Monocytes Absolute: 0.5 10*3/uL (ref 0.1–1.0)
Monocytes Relative: 10 %
Neutro Abs: 3.8 10*3/uL (ref 1.7–7.7)
Neutrophils Relative %: 74 %
Platelet Count: 128 10*3/uL — ABNORMAL LOW (ref 150–400)
RBC: 3.43 MIL/uL — ABNORMAL LOW (ref 3.87–5.11)
RDW: 19.9 % — ABNORMAL HIGH (ref 11.5–15.5)
WBC Count: 5.2 10*3/uL (ref 4.0–10.5)
nRBC: 0 % (ref 0.0–0.2)

## 2022-07-18 LAB — CMP (CANCER CENTER ONLY)
ALT: 9 U/L (ref 0–44)
AST: 20 U/L (ref 15–41)
Albumin: 3.4 g/dL — ABNORMAL LOW (ref 3.5–5.0)
Alkaline Phosphatase: 89 U/L (ref 38–126)
Anion gap: 3 — ABNORMAL LOW (ref 5–15)
BUN: 9 mg/dL (ref 8–23)
CO2: 27 mmol/L (ref 22–32)
Calcium: 8.7 mg/dL — ABNORMAL LOW (ref 8.9–10.3)
Chloride: 108 mmol/L (ref 98–111)
Creatinine: 0.68 mg/dL (ref 0.44–1.00)
GFR, Estimated: >60 mL/min (ref >60)
Glucose, Bld: 81 mg/dL (ref 70–99)
Potassium: 4.2 mmol/L (ref 3.5–5.1)
Sodium: 138 mmol/L (ref 135–145)
Total Bilirubin: 0.3 mg/dL (ref 0.3–1.2)
Total Protein: 6.3 g/dL — ABNORMAL LOW (ref 6.5–8.1)

## 2022-07-18 LAB — CEA (IN HOUSE-CHCC): CEA (CHCC-In House): 15.41 ng/mL — ABNORMAL HIGH (ref 0.00–5.00)

## 2022-07-18 MED ORDER — OXALIPLATIN CHEMO INJECTION 100 MG/20ML
70.0000 mg/m2 | Freq: Once | INTRAVENOUS | Status: AC
  Administered 2022-07-18: 105 mg via INTRAVENOUS
  Filled 2022-07-18: qty 21

## 2022-07-18 MED ORDER — PALONOSETRON HCL INJECTION 0.25 MG/5ML
0.2500 mg | Freq: Once | INTRAVENOUS | Status: AC
  Administered 2022-07-18: 0.25 mg via INTRAVENOUS
  Filled 2022-07-18: qty 5

## 2022-07-18 MED ORDER — SODIUM CHLORIDE 0.9% FLUSH: 10.0000 mL | INTRAVENOUS | Status: DC | PRN

## 2022-07-18 MED ORDER — HEPARIN SOD (PORK) LOCK FLUSH 100 UNIT/ML IV SOLN: 500.0000 [IU] | Freq: Once | INTRAVENOUS | Status: DC | PRN

## 2022-07-18 MED ORDER — SODIUM CHLORIDE 0.9% FLUSH
10.0000 mL | Freq: Once | INTRAVENOUS | Status: AC
  Administered 2022-07-18: 10 mL

## 2022-07-18 MED ORDER — LEUCOVORIN CALCIUM INJECTION 350 MG
400.0000 mg/m2 | Freq: Once | INTRAVENOUS | Status: AC
  Administered 2022-07-18: 600 mg via INTRAVENOUS
  Filled 2022-07-18: qty 30

## 2022-07-18 MED ORDER — DEXTROSE 5 % IV SOLN: Freq: Once | INTRAVENOUS | Status: AC

## 2022-07-18 MED ORDER — SODIUM CHLORIDE 0.9 % IV SOLN
1800.0000 mg/m2 | INTRAVENOUS | Status: DC
  Administered 2022-07-18: 2700 mg via INTRAVENOUS
  Filled 2022-07-18: qty 54

## 2022-07-18 MED ORDER — SODIUM CHLORIDE 0.9 % IV SOLN
10.0000 mg | Freq: Once | INTRAVENOUS | Status: AC
  Administered 2022-07-18: 10 mg via INTRAVENOUS
  Filled 2022-07-18: qty 10

## 2022-07-18 MED ORDER — SODIUM CHLORIDE 0.9 % IV SOLN
5.0000 mg/kg | Freq: Once | INTRAVENOUS | Status: AC
  Administered 2022-07-18: 250 mg via INTRAVENOUS
  Filled 2022-07-18: qty 10

## 2022-07-18 MED ORDER — SODIUM CHLORIDE 0.9 % IV SOLN: Freq: Once | INTRAVENOUS | Status: AC

## 2022-07-18 NOTE — Assessment & Plan Note (Signed)
diagnosed in 12/2015. S/p right hemicolectomy with right salpingo oophorectomy and adjuvant ChemoRT. 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 

## 2022-07-18 NOTE — Patient Instructions (Signed)
[facility]COL[facility]  Discharge Instructions: Thank you for choosing [facility] to provide your oncology and hematology care.   If you have a lab appointment with the [facility], please go directly to the [facility] and check in at the registration area.   Wear comfortable clothing and clothing appropriate for easy access to any Portacath or PICC line.   We strive to give you quality time with your provider. You may need to reschedule your appointment if you arrive late (15 or more minutes).  Arriving late affects you and other patients whose appointments are after yours.  Also, if you miss three or more appointments without notifying the office, you may be dismissed from the clinic at the provider's discretion.      For prescription refill requests, have your pharmacy contact our office and allow 72 hours for refills to be completed.    Today you received the following chemotherapy and/or immunotherapy agents: Bevacizumab, Oxaliplatin, Leucovorin, and Fluorouracil.    To help prevent nausea and vomiting after your treatment, we encourage you to take your nausea medication as directed.  BELOW ARE SYMPTOMS THAT SHOULD BE REPORTED IMMEDIATELY: *FEVER GREATER THAN 100.4 F (38 C) OR HIGHER *CHILLS OR SWEATING *NAUSEA AND VOMITING THAT IS NOT CONTROLLED WITH YOUR NAUSEA MEDICATION *UNUSUAL SHORTNESS OF BREATH *UNUSUAL BRUISING OR BLEEDING *URINARY PROBLEMS (pain or burning when urinating, or frequent urination) *BOWEL PROBLEMS (unusual diarrhea, constipation, pain near the anus) TENDERNESS IN MOUTH AND THROAT WITH OR WITHOUT PRESENCE OF ULCERS (sore throat, sores in mouth, or a toothache) UNUSUAL RASH, SWELLING OR PAIN  UNUSUAL VAGINAL DISCHARGE OR ITCHING   Items with * indicate a potential emergency and should be followed up as soon as possible or go to the Emergency Department if any problems should occur.  Please show the CHEMOTHERAPY ALERT CARD  or IMMUNOTHERAPY ALERT CARD at check-in to the Emergency Department and triage nurse.  Should you have questions after your visit or need to cancel or reschedule your appointment, please contact [facility]  Dept: [phone]  and follow the prompts.  Office hours are 8:00 a.m. to 4:30 p.m. Monday - Friday. Please note that voicemails left after 4:00 p.m. may not be returned until the following business day.  We are closed weekends and major holidays. You have access to a nurse at all times for urgent questions. Please call the main number to the clinic Dept: [phone] and follow the prompts.   For any non-urgent questions, you may also contact your provider using MyChart. We now offer e-Visits for anyone 54 and older to request care online for non-urgent symptoms. For details visit [email or web address].   Also download the MyChart app! Go to the app store, search "MyChart", open the app, select [facility], and log in with your MyChart username and password.  Masks are optional in the cancer centers. If you would like for your care team to wear a mask while they are taking care of you, please let them know. You may have one support person who is at least 74 years old accompany you for your appointments. The chemotherapy medication bag should finish at 46 hours, 96 hours, or 7 days. For example, if your pump is scheduled for 46 hours and it was put on at 4:00 p.m., it should finish at 2:00 p.m. the day it is scheduled to come off regardless of your appointment time.     Estimated time to finish at:  11: 15 am   If the display on your pump reads "Low Volume" and it is beeping, take the batteries out of the pump and come to the cancer center for it to be taken off.   If the pump alarms go off prior to the pump reading "Low Volume" then call [phone] and someone can assist you.  If the plunger comes out and the chemotherapy medication is leaking out, please  use your home chemo spill kit to clean up the spill. Do NOT use paper towels or other household products.  If you have problems or questions regarding your pump, please call either [phone] (24 hours a day) or the cancer center Monday-Friday 8:00 a.m.- 4:30 p.m. at the clinic number and we will assist you. If you are unable to get assistance, then go to the nearest Emergency Department and ask the staff to contact the IV team for assistance.

## 2022-07-18 NOTE — Assessment & Plan Note (Signed)
-  will monitor iron level  -no previous iv iron

## 2022-07-18 NOTE — Assessment & Plan Note (Addendum)
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  -She began first-line chemo with dose-reduced FOLFIRI and Beva in 2/22.  -we changed to FOLFOX with continued Beva on 03/28/22. She tolerated well overall with some fatigue and cold sensitivity. -restaging CT on 07/02/2022 showed overall SD with slight improvement, no new lesions. Will continue current chemo

## 2022-07-19 ENCOUNTER — Telehealth: Payer: Self-pay | Admitting: Hematology

## 2022-07-19 NOTE — Telephone Encounter (Signed)
Left patient a vm regarding all upcoming appointments

## 2022-07-20 ENCOUNTER — Inpatient Hospital Stay: Payer: Federal, State, Local not specified - PPO

## 2022-07-20 VITALS — BP 143/87 | HR 91 | Temp 98.5°F | Resp 18

## 2022-07-20 DIAGNOSIS — C20 Malignant neoplasm of rectum: Secondary | ICD-10-CM

## 2022-07-20 DIAGNOSIS — Z5112 Encounter for antineoplastic immunotherapy: Secondary | ICD-10-CM | POA: Diagnosis not present

## 2022-07-20 MED ORDER — PEGFILGRASTIM-JMDB 6 MG/0.6ML ~~LOC~~ SOSY
6.0000 mg | PREFILLED_SYRINGE | Freq: Once | SUBCUTANEOUS | Status: AC
  Administered 2022-07-20: 6 mg via SUBCUTANEOUS

## 2022-07-20 MED ORDER — HEPARIN SOD (PORK) LOCK FLUSH 100 UNIT/ML IV SOLN
500.0000 [IU] | Freq: Once | INTRAVENOUS | Status: AC | PRN
  Administered 2022-07-20: 500 [IU]

## 2022-07-20 MED ORDER — SODIUM CHLORIDE 0.9% FLUSH
10.0000 mL | INTRAVENOUS | Status: DC | PRN
  Administered 2022-07-20: 10 mL

## 2022-07-21 ENCOUNTER — Other Ambulatory Visit: Payer: Self-pay

## 2022-07-31 ENCOUNTER — Inpatient Hospital Stay: Payer: Federal, State, Local not specified - PPO

## 2022-07-31 ENCOUNTER — Inpatient Hospital Stay: Payer: Federal, State, Local not specified - PPO | Admitting: Hematology

## 2022-07-31 MED FILL — Dexamethasone Sodium Phosphate Inj 100 MG/10ML: INTRAMUSCULAR | Qty: 1 | Status: AC

## 2022-07-31 NOTE — Assessment & Plan Note (Addendum)
-

## 2022-07-31 NOTE — Assessment & Plan Note (Signed)
diagnosed in 12/2015. S/p right hemicolectomy with right salpingo oophorectomy and adjuvant ChemoRT. 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 

## 2022-07-31 NOTE — Assessment & Plan Note (Signed)
-  will monitor iron level  -no previous iv iron

## 2022-08-01 ENCOUNTER — Inpatient Hospital Stay: Payer: Federal, State, Local not specified - PPO | Admitting: Hematology

## 2022-08-01 ENCOUNTER — Inpatient Hospital Stay: Payer: Federal, State, Local not specified - PPO

## 2022-08-01 ENCOUNTER — Encounter: Payer: Self-pay | Admitting: Hematology

## 2022-08-01 ENCOUNTER — Inpatient Hospital Stay: Payer: Federal, State, Local not specified - PPO | Attending: Nurse Practitioner

## 2022-08-01 ENCOUNTER — Other Ambulatory Visit: Payer: Self-pay

## 2022-08-01 VITALS — BP 132/74 | HR 82 | Temp 99.1°F | Resp 18 | Wt 106.3 lb

## 2022-08-01 DIAGNOSIS — Z95828 Presence of other vascular implants and grafts: Secondary | ICD-10-CM

## 2022-08-01 DIAGNOSIS — D500 Iron deficiency anemia secondary to blood loss (chronic): Secondary | ICD-10-CM

## 2022-08-01 DIAGNOSIS — T451X5D Adverse effect of antineoplastic and immunosuppressive drugs, subsequent encounter: Secondary | ICD-10-CM | POA: Insufficient documentation

## 2022-08-01 DIAGNOSIS — C20 Malignant neoplasm of rectum: Secondary | ICD-10-CM

## 2022-08-01 DIAGNOSIS — C787 Secondary malignant neoplasm of liver and intrahepatic bile duct: Secondary | ICD-10-CM | POA: Insufficient documentation

## 2022-08-01 DIAGNOSIS — Z1509 Genetic susceptibility to other malignant neoplasm: Secondary | ICD-10-CM | POA: Insufficient documentation

## 2022-08-01 DIAGNOSIS — C182 Malignant neoplasm of ascending colon: Secondary | ICD-10-CM | POA: Diagnosis present

## 2022-08-01 DIAGNOSIS — Z5111 Encounter for antineoplastic chemotherapy: Secondary | ICD-10-CM | POA: Insufficient documentation

## 2022-08-01 DIAGNOSIS — Z5112 Encounter for antineoplastic immunotherapy: Secondary | ICD-10-CM | POA: Diagnosis present

## 2022-08-01 DIAGNOSIS — Z5189 Encounter for other specified aftercare: Secondary | ICD-10-CM | POA: Insufficient documentation

## 2022-08-01 DIAGNOSIS — Z923 Personal history of irradiation: Secondary | ICD-10-CM | POA: Insufficient documentation

## 2022-08-01 DIAGNOSIS — Z9221 Personal history of antineoplastic chemotherapy: Secondary | ICD-10-CM | POA: Diagnosis not present

## 2022-08-01 DIAGNOSIS — I10 Essential (primary) hypertension: Secondary | ICD-10-CM | POA: Insufficient documentation

## 2022-08-01 DIAGNOSIS — F419 Anxiety disorder, unspecified: Secondary | ICD-10-CM | POA: Insufficient documentation

## 2022-08-01 DIAGNOSIS — Z79899 Other long term (current) drug therapy: Secondary | ICD-10-CM | POA: Insufficient documentation

## 2022-08-01 DIAGNOSIS — D6481 Anemia due to antineoplastic chemotherapy: Secondary | ICD-10-CM | POA: Insufficient documentation

## 2022-08-01 LAB — CMP (CANCER CENTER ONLY)
ALT: 8 U/L (ref 0–44)
AST: 19 U/L (ref 15–41)
Albumin: 3.3 g/dL — ABNORMAL LOW (ref 3.5–5.0)
Alkaline Phosphatase: 86 U/L (ref 38–126)
Anion gap: 6 (ref 5–15)
BUN: 12 mg/dL (ref 8–23)
CO2: 25 mmol/L (ref 22–32)
Calcium: 8.5 mg/dL — ABNORMAL LOW (ref 8.9–10.3)
Chloride: 108 mmol/L (ref 98–111)
Creatinine: 0.62 mg/dL (ref 0.44–1.00)
GFR, Estimated: >60 mL/min (ref >60)
Glucose, Bld: 80 mg/dL (ref 70–99)
Potassium: 4 mmol/L (ref 3.5–5.1)
Sodium: 139 mmol/L (ref 135–145)
Total Bilirubin: 0.2 mg/dL — ABNORMAL LOW (ref 0.3–1.2)
Total Protein: 6.4 g/dL — ABNORMAL LOW (ref 6.5–8.1)

## 2022-08-01 LAB — CBC WITH DIFFERENTIAL (CANCER CENTER ONLY)
Abs Immature Granulocytes: 0.03 10*3/uL (ref 0.00–0.07)
Basophils Absolute: 0 10*3/uL (ref 0.0–0.1)
Basophils Relative: 0 %
Eosinophils Absolute: 0 10*3/uL (ref 0.0–0.5)
Eosinophils Relative: 1 %
HCT: 27.4 % — ABNORMAL LOW (ref 36.0–46.0)
Hemoglobin: 8.4 g/dL — ABNORMAL LOW (ref 12.0–15.0)
Immature Granulocytes: 1 %
Lymphocytes Relative: 18 %
Lymphs Abs: 0.9 10*3/uL (ref 0.7–4.0)
MCH: 25.1 pg — ABNORMAL LOW (ref 26.0–34.0)
MCHC: 30.7 g/dL (ref 30.0–36.0)
MCV: 82 fL (ref 80.0–100.0)
Monocytes Absolute: 0.6 10*3/uL (ref 0.1–1.0)
Monocytes Relative: 11 %
Neutro Abs: 3.5 10*3/uL (ref 1.7–7.7)
Neutrophils Relative %: 69 %
Platelet Count: 111 10*3/uL — ABNORMAL LOW (ref 150–400)
RBC: 3.34 MIL/uL — ABNORMAL LOW (ref 3.87–5.11)
RDW: 19 % — ABNORMAL HIGH (ref 11.5–15.5)
WBC Count: 5.1 10*3/uL (ref 4.0–10.5)
nRBC: 0 % (ref 0.0–0.2)

## 2022-08-01 LAB — IRON AND IRON BINDING CAPACITY (CC-WL,HP ONLY)
Iron: 27 ug/dL — ABNORMAL LOW (ref 28–170)
Saturation Ratios: 6 % — ABNORMAL LOW (ref 10.4–31.8)
TIBC: 421 ug/dL (ref 250–450)
UIBC: 394 ug/dL (ref 148–442)

## 2022-08-01 LAB — FERRITIN: Ferritin: 27 ng/mL (ref 11–307)

## 2022-08-01 LAB — TOTAL PROTEIN, URINE DIPSTICK: Protein, ur: NEGATIVE mg/dL

## 2022-08-01 MED ORDER — OXALIPLATIN CHEMO INJECTION 100 MG/20ML
70.0000 mg/m2 | Freq: Once | INTRAVENOUS | Status: AC
  Administered 2022-08-01: 105 mg via INTRAVENOUS
  Filled 2022-08-01: qty 1

## 2022-08-01 MED ORDER — LEUCOVORIN CALCIUM INJECTION 350 MG
400.0000 mg/m2 | Freq: Once | INTRAVENOUS | Status: AC
  Administered 2022-08-01: 600 mg via INTRAVENOUS
  Filled 2022-08-01: qty 30

## 2022-08-01 MED ORDER — HEPARIN SOD (PORK) LOCK FLUSH 100 UNIT/ML IV SOLN: 500.0000 [IU] | Freq: Once | INTRAVENOUS | Status: DC | PRN

## 2022-08-01 MED ORDER — SODIUM CHLORIDE 0.9 % IV SOLN
1800.0000 mg/m2 | INTRAVENOUS | Status: DC
  Administered 2022-08-01: 2700 mg via INTRAVENOUS
  Filled 2022-08-01: qty 54

## 2022-08-01 MED ORDER — SODIUM CHLORIDE 0.9% FLUSH
10.0000 mL | Freq: Once | INTRAVENOUS | Status: AC
  Administered 2022-08-01: 10 mL

## 2022-08-01 MED ORDER — PALONOSETRON HCL INJECTION 0.25 MG/5ML
0.2500 mg | Freq: Once | INTRAVENOUS | Status: AC
  Administered 2022-08-01: 0.25 mg via INTRAVENOUS
  Filled 2022-08-01: qty 5

## 2022-08-01 MED ORDER — SODIUM CHLORIDE 0.9 % IV SOLN: Freq: Once | INTRAVENOUS | Status: AC

## 2022-08-01 MED ORDER — SODIUM CHLORIDE 0.9 % IV SOLN
5.0000 mg/kg | Freq: Once | INTRAVENOUS | Status: AC
  Administered 2022-08-01: 250 mg via INTRAVENOUS
  Filled 2022-08-01: qty 10

## 2022-08-01 MED ORDER — SODIUM CHLORIDE 0.9% FLUSH
10.0000 mL | INTRAVENOUS | Status: DC | PRN
  Administered 2022-08-01: 10 mL

## 2022-08-01 MED ORDER — SODIUM CHLORIDE 0.9 % IV SOLN
10.0000 mg | Freq: Once | INTRAVENOUS | Status: AC
  Administered 2022-08-01: 10 mg via INTRAVENOUS
  Filled 2022-08-01: qty 10

## 2022-08-01 MED ORDER — DEXTROSE 5 % IV SOLN: Freq: Once | INTRAVENOUS | Status: AC

## 2022-08-01 NOTE — Progress Notes (Signed)
[facility]   Telephone:(336) [phone] Fax:(336) [phone]   Clinic Follow up Note   Patient Care Team: Patient, No Pcp Per as PCP - General (General Practice) [name], NP as Nurse Practitioner (Oncology) [name], MD as Consulting Physician (Oncology) [name], RN (Inactive) as Oncology Nurse Navigator  Date of Service:  08/01/2022  CHIEF COMPLAINT: f/u of metastatic rectal cancer, h/o colon cancer        CURRENT THERAPY:   FOLFOX with bevacizumab, q14d, starting 03/28/22      ASSESSMENT:  PERLA Brittney [facility] is a 72 y.o. 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  -She began first-line chemo with dose-reduced FOLFIRI and Beva in 2/22.  -we changed to FOLFOX with continued Beva on 03/28/22. She tolerated well overall with some fatigue and cold sensitivity. -restaging CT on 07/02/2022 showed overall SD with slight improvement, no new lesions -She is tolerating chemotherapy overall well, will continue.  Cancer of ascending colon ([place]) diagnosed in 12/2015. S/p right hemicolectomy with right salpingo oophorectomy and adjuvant ChemoRT. 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    Iron deficiency anemia due to chronic blood loss -will monitor iron level  -no previous iv iron  -worse lately -will monitor iron level    PLAN: -lab pending - I recommend taking OTC Prenatal vitamins. -Tumor Marker - stable -will proceed chemo at same dose today  -f/u in 2 weeks before next cycle   SUMMARY OF ONCOLOGIC HISTORY: Oncology History Overview Note  Cancer Staging Cancer of ascending colon [facility]) Staging  form: Colon and Rectum, AJCC 7th Edition - Clinical stage from 02/07/2016: Stage IIIC (T4b, N1b, M0) - Signed by [name], MD on 03/04/2016 Laterality: Right Residual tumor (R): R2 - Macroscopic    Cancer of ascending colon ([place])  10/25/2015 Imaging   CT ABD/PELVIS:  Inflammatory changes inferior to the cecal tip appear improved, there is still irregular soft tissue thickening of the cecal tip, and there are adjacent prominent lymph nodes in the ileocolonic mesentery, measuring 13 mm on image 49 and 8 mm on image 52. In addition, there is a 2.5 x 1.8 cm nodule on image 46 which has central low density. Therefore, these findings are moderately suspicious for an underlying cecal malignancy with perforation.    01/19/2016 Procedure   COLONOSCOPY per Dr. [name]: Fungating, ulcerated mass almost obstructing mid ascending colon   01/19/2016 Initial Biopsy   Diagnosis Surgical [P], cecal mass - INVASIVE ADENOCARCINOMA WITH ULCERATION. - SEE COMMENT.   02/05/2016 Tumor Marker   Patient's tumor was tested for the following markers: CEA Results of the tumor marker test revealed 5.7.   02/07/2016 Initial Diagnosis   Cancer of ascending colon ([place])   02/07/2016 Definitive Surgery   Laparoscopic assisted right hemicolectomy and right salpingo oopherectomy--Dr. [name]   02/07/2016 Pathologic Stage   p T4 N1b   2/43 nodes +   02/07/2016 Pathology Results   MMR normal; G2 adenocarcinoma;proximal & distal margins negative; soft tissue mass on pelvic sidewall + for adenocarcinoma with positive margin MSI Stable   03/08/2016 Imaging   CT chest negative for metastasis.    03/19/2016 -  04/25/2016 Radiation Therapy   Adjuvant irradiation, 50 gray in 28 fractions   03/19/2016 - 04/22/2016 Chemotherapy   Xeloda 1500 mg twice daily, started on 03/19/2016, dose reduced to 1000 mg twice daily from week 3 due to neutropenia, and patient stopped 3 days before last dose radiation due to difficulty swallowing the  pill    05/20/2016 -  Adjuvant Chemotherapy   Patient declined adjuvant chemotherapy   09/16/2016 Imaging   CT CAP w Contrast 1. No evidence of local tumor recurrence at the ileocolic anastomosis. 2. No findings suspicious for metastatic disease in the chest, abdomen or pelvis. 3. Nonspecific trace free fluid in the pelvic cul-de-sac. 4. Stable solitary 3 mm right upper lobe pulmonary nodule, for which 6 month stability has been demonstrated, probably benign. 5. Additional findings include stable right posterior pericardial cyst and small calcified uterine fibroids.   05/13/2017 Imaging   CT CAP W Contrast 05/13/17 IMPRESSION: 1. No current findings of residual or recurrent malignancy. 2. Mild prominence of stool throughout the colon. Nondistended portions of the rectum. 3. Several tiny pulmonary nodules are stable from the earliest available comparison of 03/08/2016 and probably benign, but may merit surveillance. 4. Other imaging findings of potential clinical significance: Old granulomatous disease. Aortoiliac atherosclerotic vascular disease. Lumbar spondylosis and degenerative disc disease. Stable amount of trace free pelvic fluid.   04/27/2018 Imaging   04/27/2018 CT CAP IMPRESSION: Stable exam. No evidence of recurrent or metastatic carcinoma within the chest, abdomen, or pelvis   04/19/2019 Imaging   CT CAP W Contrast  IMPRESSION: Chest Impression:   1. No evidence of thoracic metastasis. 2. Stable small bilateral pulmonary nodules.   Abdomen / Pelvis Impression:   1. No evidence local colorectal carcinoma recurrence or metastasis in the abdomen pelvis. 2. Post RIGHT hemicolectomy.   01/22/2021 Imaging   CT CAP  IMPRESSION: CT CHEST IMPRESSION   1. Similar nonspecific pulmonary nodules. 2. New posterior left upper lobe reticulonodular opacity, suspicious for interval mild infection or inflammation. 3. No thoracic adenopathy.   CT ABDOMEN AND PELVIS  IMPRESSION   1. Further decrease in size of high left hepatic lobe 3 mm low-density lesion. No new or progressive metastatic disease within the abdomen or pelvis. 2. Similar trace free pelvic fluid. 3. Similar nonspecific mid rectal wall thickening. 4.  Aortic Atherosclerosis (ICD10-I70.0).   04/23/2021 Imaging   CT CAP  IMPRESSION: 1. Treated metastatic lesion between segments 2 and 3 of the liver, slightly smaller and less distinct than prior examination. No other signs of definite metastatic disease elsewhere in the abdomen or pelvis. 2. Multiple small pulmonary nodules, stable compared to the prior examination, favored to be benign. No definitive findings to suggest metastatic disease to the thorax. 3. Aortic atherosclerosis. 4. Additional incidental findings, as above.   08/13/2021 Imaging   EXAM: CT CHEST, ABDOMEN, AND PELVIS WITH CONTRAST  IMPRESSION: 1. A previously seen PET avid lesion of the anterior left lobe of the liver, hepatic segment II, is no longer discretely appreciable consistent with treatment response of a hepatic metastasis. 2. No evidence of new metastatic disease in the chest, abdomen, or pelvis. 3. Interval increase in a small focus of consolidation and nodularity of the medial left upper lobe, consistent with minimal, ongoing atypical infection. Additional tiny bilateral pulmonary nodules are stable and almost certainly incidental benign. Attention on follow-up. 4. Status post right hemicolectomy and ileocolic anastomosis.   07/02/2022 Imaging    IMPRESSION: CHEST IMPRESSION:   1. No evidence of  thoracic metastasis. 2. Stable small pulmonary nodules.   PELVIS IMPRESSION:   1. Stable to slight decrease in size of subcapsular lesion in the RIGHT hepatic lobe. 2. No evidence of new or progressive disease in the abdomen pelvis. 3.  Aortic Atherosclerosis (ICD10-I70.0).     Rectal cancer metastasized to liver ([place])  06/15/2020 Procedure    Screening Colonoscopy by Dr [name]  IMPRESSION - Decreased sphincter tone and internal hemorrhoids that prolapse with straining, but require manual replacement into the anal canal (Grade III) found on digital rectal exam. - Patent side-to-side ileo-colonic anastomosis, characterized by healthy appearing mucosa. - The examined portion of the ileum was normal. - One diminutive polyp in the proximal transverse colon, removed with a cold biopsy forceps. Resected and retrieved. - Likely malignant partially obstructing tumor in the mid rectum. Biopsied. Tattooed. - The examination was otherwise normal on direct and retroflexion views.   06/15/2020 Initial Biopsy   Diagnosis 1. Transverse Colon Polyp - HYPERPLASTIC POLYP 2. Rectum, biopsy - ADENOCARCINOMA ARISING IN A TUBULAR ADENOMA WITH HIGH-GRADE DYSPLASIA. SEE NOTE Diagnosis Note 2. Dr. [name] reviewed the case and concurs with the diagnosis. Dr. [name] was notified on 06/16/2020.   06/28/2020 Imaging   CT CAP  IMPRESSION: 1. New low-density focus in the anterior aspect of the lateral segment LEFT hepatic lobe measuring 1.2 x 1.0 cm, compatible with small metastatic lesion in the LEFT hepatic lobe. 2. Soft tissue in the RIGHT iliac fossa following RIGHT hemicolectomy invades the psoas musculature and is slowly enlarging over time, more linear on the prior study now highly concerning for recurrence/metastasis to this location. 3. Signs of enteritis, potentially post radiation changes of the small bowel. Tethered small bowel in the RIGHT lower quadrant shows focal thickening and narrowing suspicious for small bowel involvement and developing partial obstruction though currently contrast passes beyond this point into the colon. 4. Rectal thickening in this patient with known rectal mass as described. 5. No evidence of metastatic disease in the chest. 6. Stable small pulmonary nodules. 7.  and aortic atherosclerosis.   Aortic  Atherosclerosis (ICD10-I70.0) and Emphysema (ICD10-J43.9).   07/04/2020 Initial Diagnosis   Rectal cancer metastasized to liver ([place])   07/12/2020 PET scan   IMPRESSION: 1. Exam positive for FDG avid rectal tumor which corresponds to the recent colonoscopy findings. 2. FDG avid soft tissue mass within the right iliac fossa is noted and consistent with local tumor recurrence from previous ascending colon tumor. 3. Lateral segment left lobe of liver lesion is FDG avid concerning for liver metastasis. 4. No specific findings identified to suggest metastatic disease to the chest.   08/10/2020 -  Chemotherapy   First-line FOLFIRI q2weeks starting 08/10/20. dose reduced with cycle 1. Irinotecan/5FU increased and Bevacizumab added with cycle 2 on 08/23/2020    08/17/2020 - 03/09/2022 Chemotherapy   Patient is on Treatment Plan : COLORECTAL FOLFIRI + Bevacizumab q14d     10/27/2020 Imaging   CT CAP  IMPRESSION: 1. Interval decrease in size of the hypermetabolic left hepatic lesion, consistent with metastatic disease. No new liver lesion evident. 2. Interval resolution of the hypermetabolic soft tissue lesion along the right iliac fossa with no measurable soft tissue lesion remaining at this location today. 3. Similar appearance of soft tissue fullness in the rectum at the site of the hypermetabolic lesion seen previously. 4. Stable tiny bilateral pulmonary nodules. Continued attention on follow-up recommended. 5. Small volume free fluid in the pelvis. 6. Aortic Atherosclerosis (ICD10-I70.0).   01/22/2021 Imaging  CT CAP  IMPRESSION: CT CHEST IMPRESSION   1. Similar nonspecific pulmonary nodules. 2. New posterior left upper lobe reticulonodular opacity, suspicious for interval mild infection or inflammation. 3. No thoracic adenopathy.   CT ABDOMEN AND PELVIS IMPRESSION   1. Further decrease in size of high left hepatic lobe 3 mm low-density lesion. No new or progressive metastatic  disease within the abdomen or pelvis. 2. Similar trace free pelvic fluid. 3. Similar nonspecific mid rectal wall thickening. 4.  Aortic Atherosclerosis (ICD10-I70.0).   04/23/2021 Imaging   CT CAP  IMPRESSION: 1. Treated metastatic lesion between segments 2 and 3 of the liver, slightly smaller and less distinct than prior examination. No other signs of definite metastatic disease elsewhere in the abdomen or pelvis. 2. Multiple small pulmonary nodules, stable compared to the prior examination, favored to be benign. No definitive findings to suggest metastatic disease to the thorax. 3. Aortic atherosclerosis. 4. Additional incidental findings, as above.   08/13/2021 Imaging   EXAM: CT CHEST, ABDOMEN, AND PELVIS WITH CONTRAST  IMPRESSION: 1. A previously seen PET avid lesion of the anterior left lobe of the liver, hepatic segment II, is no longer discretely appreciable consistent with treatment response of a hepatic metastasis. 2. No evidence of new metastatic disease in the chest, abdomen, or pelvis. 3. Interval increase in a small focus of consolidation and nodularity of the medial left upper lobe, consistent with minimal, ongoing atypical infection. Additional tiny bilateral pulmonary nodules are stable and almost certainly incidental benign. Attention on follow-up. 4. Status post right hemicolectomy and ileocolic anastomosis.   03/28/2022 -  Chemotherapy   Patient is on Treatment Plan : COLORECTAL FOLFOX + Bevacizumab q14d        INTERVAL HISTORY:  Brittney Spencer is here for a follow up of metastatic rectal cancer, h/o colon cancer       She was last seen by me on 07/18/2022 She presents to the clinic alone. Pt states after the day of chem she has some fatigue. Pt reports that her diarrhea is much better and it doesn't interfere with her activities. Pt denies having numbness and tinging in her and feet.      All other systems were reviewed with the patient and are  negative.  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 Some residual intraretinal fluid remains, very small localized subfoveal of fluid remains although this continues to slowly resorb. We'll continue to observe.   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  ALLERGIES:  is allergic to fish allergy, peanut-containing drug products, soy allergy, and buspirone.  MEDICATIONS:  Current Outpatient Medications  Medication Sig Dispense Refill   ALPRAZolam (XANAX) 0.25 MG tablet Take 1 tablet (0.25 mg total) by mouth daily as needed for anxiety. 30 tablet 0   Cholecalciferol (VITAMIN D3 PO) Take by mouth daily.     docusate sodium (COLACE) 100 MG capsule 1 capsule as needed     lidocaine-prilocaine (EMLA) cream Apply 1 application  topically as needed. 30 g 1   NORVASC 2.5 MG tablet Take 1 tablet (2.5 mg total) by mouth daily. 90 tablet 1   ondansetron (ZOFRAN) 8 MG tablet Take 1 tablet (8 mg total) by mouth every 8 (eight) hours as needed for nausea or vomiting. 20 tablet 2   prochlorperazine (COMPAZINE) 10 MG tablet Take 1 tablet (10 mg total) by mouth every 6 (six) hours as needed for nausea or vomiting. 30 tablet 2   Current Facility-Administered Medications  Medication Dose Route Frequency Provider Last Rate Last Admin   0.9 %  sodium chloride infusion  500 mL Intravenous Once Nelida Meuse III, MD        PHYSICAL EXAMINATION: ECOG PERFORMANCE STATUS: 1 - Symptomatic but completely ambulatory  Vitals:   08/01/22 0800  BP: 132/74  Pulse: 82  Resp: 18  Temp: 99.1 F (37.3 C)  SpO2: 100%   Wt Readings from Last 3 Encounters:  08/01/22 106 lb 4.8 oz (48.2 kg)  07/18/22 106 lb (48.1 kg)  07/04/22 105 lb 8 oz (47.9 kg)     GENERAL:alert, no distress and comfortable SKIN: skin color, texture, turgor are normal, no rashes or significant lesions EYES: normal, Conjunctiva are pink and non-injected, sclera clear NECK: supple, thyroid normal size, non-tender, without nodularity LYMPH:  no palpable lymphadenopathy in the cervical, axillary  LUNGS: clear to auscultation and percussion with normal breathing effort HEART: regular rate & rhythm and no murmurs and no lower extremity edema ABDOMEN:abdomen soft, non-tender and normal bowel sounds Musculoskeletal:no cyanosis of digits and no clubbing  NEURO: alert & oriented x 3 with fluent speech, no focal motor/sensory deficits  LABORATORY DATA:  I have reviewed the data as listed    Latest Ref Rng & Units 08/01/2022    7:34 AM 07/18/2022    7:12 AM 07/02/2022    8:27 AM  CBC  WBC 4.0 - 10.5 K/uL 5.1  5.2  4.9   Hemoglobin 12.0 - 15.0 g/dL 8.4  8.7  8.9   Hematocrit 36.0 - 46.0 % 27.4  28.3  29.2   Platelets 150 - 400 K/uL 111  128  183         Latest Ref  Rng & Units 08/01/2022    7:34 AM 07/18/2022    7:12 AM 07/02/2022    8:27 AM  CMP  Glucose 70 - 99 mg/dL 80  81  77   BUN 8 - 23 mg/dL '12  9  13   '$ Creatinine 0.44 - 1.00 mg/dL 0.62  0.68  0.58   Sodium 135 - 145 mmol/L 139  138  138   Potassium 3.5 - 5.1 mmol/L 4.0  4.2  4.0   Chloride 98 - 111  mmol/L 108  108  107   CO2 22 - 32 mmol/L '25  27  26   '$ Calcium 8.9 - 10.3 mg/dL 8.5  8.7  9.0   Total Protein 6.5 - 8.1 g/dL 6.4  6.3  6.7   Total Bilirubin 0.3 - 1.2 mg/dL 0.2  0.3  0.3   Alkaline Phos 38 - 126 U/L 86  89  74   AST 15 - 41 U/L '19  20  23   '$ ALT 0 - 44 U/L '8  9  12       '$ RADIOGRAPHIC STUDIES: I have personally reviewed the radiological images as listed and agreed with the findings in the report. No results found.    Orders Placed This Encounter  Procedures   CBC with Differential ([facility] Only)    Standing Status:   Future    Standing Expiration Date:   08/30/2023   CMP ([facility] only)    Standing Status:   Future    Standing Expiration Date:   08/30/2023   Total Protein, Urine dipstick    Standing Status:   Future    Standing Expiration Date:   08/30/2023   All questions were answered. The patient knows to call the clinic with any problems, questions or concerns. No barriers to learning was detected. The total time spent in the appointment was 30 minutes.     [name], MD 08/01/2022   Felicity Coyer, CMA, am acting as scribe for [name], MD.   I have reviewed the above documentation for accuracy and completeness, and I agree with the above.

## 2022-08-01 NOTE — Patient Instructions (Signed)
[facility]  Discharge Instructions: Thank you for choosing [facility] to provide your oncology and hematology care.   If you have a lab appointment with the [facility], please go directly to the [facility] and check in at the registration area.   Wear comfortable clothing and clothing appropriate for easy access to any Portacath or PICC line.   We strive to give you quality time with your provider. You may need to reschedule your appointment if you arrive late (15 or more minutes).  Arriving late affects you and other patients whose appointments are after yours.  Also, if you miss three or more appointments without notifying the office, you may be dismissed from the clinic at the provider's discretion.      For prescription refill requests, have your pharmacy contact our office and allow 72 hours for refills to be completed.    Today you received the following chemotherapy and/or immunotherapy agents: Zirabev/Oxaliplatin/Leucovorin/Fluorouracil   To help prevent nausea and vomiting after your treatment, we encourage you to take your nausea medication as directed.  BELOW ARE SYMPTOMS THAT SHOULD BE REPORTED IMMEDIATELY: *FEVER GREATER THAN 100.4 F (38 C) OR HIGHER *CHILLS OR SWEATING *NAUSEA AND VOMITING THAT IS NOT CONTROLLED WITH YOUR NAUSEA MEDICATION *UNUSUAL SHORTNESS OF BREATH *UNUSUAL BRUISING OR BLEEDING *URINARY PROBLEMS (pain or burning when urinating, or frequent urination) *BOWEL PROBLEMS (unusual diarrhea, constipation, pain near the anus) TENDERNESS IN MOUTH AND THROAT WITH OR WITHOUT PRESENCE OF ULCERS (sore throat, sores in mouth, or a toothache) UNUSUAL RASH, SWELLING OR PAIN  UNUSUAL VAGINAL DISCHARGE OR ITCHING   Items with * indicate a potential emergency and should be followed up as soon as possible or go to the Emergency Department if any problems should occur.  Please show the CHEMOTHERAPY ALERT CARD or  IMMUNOTHERAPY ALERT CARD at check-in to the Emergency Department and triage nurse.  Should you have questions after your visit or need to cancel or reschedule your appointment, please contact [facility]  Dept: [phone]  and follow the prompts.  Office hours are 8:00 a.m. to 4:30 p.m. Monday - Friday. Please note that voicemails left after 4:00 p.m. may not be returned until the following business day.  We are closed weekends and major holidays. You have access to a nurse at all times for urgent questions. Please call the main number to the clinic Dept: [phone] and follow the prompts.   For any non-urgent questions, you may also contact your provider using MyChart. We now offer e-Visits for anyone 30 and older to request care online for non-urgent symptoms. For details visit [email or web address].   Also download the MyChart app! Go to the app store, search "MyChart", open the app, select [facility], and log in with your MyChart username and password.

## 2022-08-03 ENCOUNTER — Other Ambulatory Visit: Payer: Self-pay

## 2022-08-03 ENCOUNTER — Other Ambulatory Visit: Payer: Self-pay | Admitting: Hematology

## 2022-08-03 ENCOUNTER — Inpatient Hospital Stay: Payer: Federal, State, Local not specified - PPO

## 2022-08-03 VITALS — BP 150/72 | HR 86 | Temp 100.6°F | Resp 17

## 2022-08-03 DIAGNOSIS — Z5112 Encounter for antineoplastic immunotherapy: Secondary | ICD-10-CM | POA: Diagnosis not present

## 2022-08-03 DIAGNOSIS — C20 Malignant neoplasm of rectum: Secondary | ICD-10-CM

## 2022-08-03 MED ORDER — HEPARIN SOD (PORK) LOCK FLUSH 100 UNIT/ML IV SOLN
500.0000 [IU] | Freq: Once | INTRAVENOUS | Status: AC | PRN
  Administered 2022-08-03: 500 [IU]

## 2022-08-03 MED ORDER — PEGFILGRASTIM-JMDB 6 MG/0.6ML ~~LOC~~ SOSY
6.0000 mg | PREFILLED_SYRINGE | Freq: Once | SUBCUTANEOUS | Status: AC
  Administered 2022-08-03: 6 mg via SUBCUTANEOUS

## 2022-08-03 MED ORDER — SODIUM CHLORIDE 0.9% FLUSH
10.0000 mL | INTRAVENOUS | Status: DC | PRN
  Administered 2022-08-03: 10 mL

## 2022-08-03 NOTE — Patient Instructions (Signed)

## 2022-08-05 ENCOUNTER — Telehealth: Payer: Self-pay

## 2022-08-05 NOTE — Telephone Encounter (Addendum)
Called the patient and made her aware of results below as per DR. Feng. Patient voiced understanding.    ----- Message from Evalee Jefferson, RN sent at 08/03/2022 11:39 AM EST ----- Regarding: Venofer '300mg'$  Good morning ladies,  Brittney Spencer - Dr. Burr Medico would like for this pt to get 3 doses of Venofer 300 mg.  Does the pt have financial approval clearance from pt's insurance.   [facility] - Dr. Burr Medico would like for the pt to get the Venofer on the same day as her chemotherapy infusions; therefore, chair time will be extended.  (If possible starting with pt's next treatment)  Hardeep Reetz, please call pt to let her know her iron is low and will need 3 IV Iron infusions.     Thanks everyone!  Earleen Reaper, BSN RN

## 2022-08-14 MED FILL — Dexamethasone Sodium Phosphate Inj 100 MG/10ML: INTRAMUSCULAR | Qty: 1 | Status: AC

## 2022-08-14 NOTE — Progress Notes (Unsigned)
Brittney Spencer   [phone] [phone]   Clinic Follow up Note   Patient Care Team: Patient, No Pcp Per as PCP - General (General Practice) Brittney Feeling, NP as Nurse Practitioner (Oncology) Brittney Merle, MD as Consulting Physician (Oncology) [name], RN (Inactive) as Oncology Nurse Navigator  Date of Service:  08/15/2022  CHIEF COMPLAINT: f/u of metastatic rectal cancer, h/o colon cancer       CURRENT THERAPY:    FOLFOX with bevacizumab, q14d, starting 03/28/22        ASSESSMENT:  Brittney Spencer is a 72 y.o. 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  -She began first-line chemo with dose-reduced FOLFIRI and Beva in 2/22.  -we changed to FOLFOX with continued Beva on 03/28/22. She tolerated well overall with some fatigue and cold sensitivity. -restaging CT on 07/02/2022 showed overall SD with slight improvement, no new lesions -She is tolerating chemotherapy overall well, will continue.  Cancer of ascending colon ([place]) diagnosed in 12/2015. S/p right hemicolectomy with right salpingo oophorectomy and adjuvant ChemoRT. 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  Iron deficiency anemia due to chronic blood loss will monitor iron level  -no previous iv iron  -worse lately -will monitor iron level   DNR (do not resuscitate) -pt has a living will and agrees with DNR, this is documented in her chart    PLAN: - Proceed with C10 folfox+ Beva, Oxaliplatin at reduce today dose due to fatigue  -due to low iron, will give first time Venofer, she agreed  -lab,flush,f/u and Folfox+ Beva+  venofer 08/02/2022  Addendum Pt had mild abdominal cramps and itchiness after venofer, symptoms resolved after Benadryl and Pepcid.  Will add premeds for future infusion.    SUMMARY OF ONCOLOGIC HISTORY: Oncology History Overview Note  Cancer Staging Cancer of ascending colon [facility]) Staging form: Colon and Rectum, AJCC 7th Edition - Clinical stage from 02/07/2016: Stage IIIC (T4b, N1b, M0) - Signed by Brittney Merle, MD on 03/04/2016 Laterality: Right Residual tumor (R): R2 - Macroscopic    Cancer of ascending colon ([place])  10/25/2015 Imaging   CT ABD/PELVIS:  Inflammatory changes inferior to the cecal tip appear improved, there is still irregular soft tissue thickening of the cecal tip, and there are adjacent prominent lymph nodes in the ileocolonic mesentery, measuring 13 mm on image 49 and 8 mm on image 52. In addition, there is a 2.5 x 1.8 cm nodule on image 46 which has central low density. Therefore, these findings are moderately suspicious for an underlying cecal malignancy with perforation.    01/19/2016 Procedure   COLONOSCOPY per Dr. [name]: Fungating, ulcerated mass almost obstructing mid ascending colon   01/19/2016 Initial Biopsy   Diagnosis Surgical [P], cecal mass - INVASIVE ADENOCARCINOMA WITH ULCERATION. - SEE COMMENT.   02/05/2016 Tumor Marker   Patient's tumor was tested for the following markers: CEA Results of the tumor marker test revealed 5.7.   02/07/2016 Initial Diagnosis   Cancer of ascending colon ([place])   02/07/2016 Definitive Surgery   Laparoscopic assisted right hemicolectomy and right salpingo oopherectomy--Dr. [name]   02/07/2016 Pathologic Stage   p T4  N1b   2/43 nodes +   02/07/2016 Pathology Results   MMR normal; G2 adenocarcinoma;proximal & distal margins negative; soft tissue mass on pelvic sidewall + for adenocarcinoma with positive margin MSI Stable   03/08/2016 Imaging   CT chest negative for metastasis.    03/19/2016 - 04/25/2016 Radiation Therapy    Adjuvant irradiation, 50 gray in 28 fractions   03/19/2016 - 04/22/2016 Chemotherapy   Xeloda 1500 mg twice daily, started on 03/19/2016, dose reduced to 1000 mg twice daily from week 3 due to neutropenia, and patient stopped 3 days before last dose radiation due to difficulty swallowing the pill    05/20/2016 -  Adjuvant Chemotherapy   Patient declined adjuvant chemotherapy   09/16/2016 Imaging   CT CAP w Contrast 1. No evidence of local tumor recurrence at the ileocolic anastomosis. 2. No findings suspicious for metastatic disease in the chest, abdomen or pelvis. 3. Nonspecific trace free fluid in the pelvic cul-de-sac. 4. Stable solitary 3 mm right upper lobe pulmonary nodule, for which 6 month stability has been demonstrated, probably benign. 5. Additional findings include stable right posterior pericardial cyst and small calcified uterine fibroids.   05/13/2017 Imaging   CT CAP W Contrast 05/13/17 IMPRESSION: 1. No current findings of residual or recurrent malignancy. 2. Mild prominence of stool throughout the colon. Nondistended portions of the rectum. 3. Several tiny pulmonary nodules are stable from the earliest available comparison of 03/08/2016 and probably benign, but may merit surveillance. 4. Other imaging findings of potential clinical significance: Old granulomatous disease. Aortoiliac atherosclerotic vascular disease. Lumbar spondylosis and degenerative disc disease. Stable amount of trace free pelvic fluid.   04/27/2018 Imaging   04/27/2018 CT CAP IMPRESSION: Stable exam. No evidence of recurrent or metastatic carcinoma within the chest, abdomen, or pelvis   04/19/2019 Imaging   CT CAP W Contrast  IMPRESSION: Chest Impression:   1. No evidence of thoracic metastasis. 2. Stable small bilateral pulmonary nodules.   Abdomen / Pelvis Impression:   1. No evidence local colorectal carcinoma recurrence or metastasis in the abdomen pelvis. 2. Post RIGHT  hemicolectomy.   01/22/2021 Imaging   CT CAP  IMPRESSION: CT CHEST IMPRESSION   1. Similar nonspecific pulmonary nodules. 2. New posterior left upper lobe reticulonodular opacity, suspicious for interval mild infection or inflammation. 3. No thoracic adenopathy.   CT ABDOMEN AND PELVIS IMPRESSION   1. Further decrease in size of high left hepatic lobe 3 mm low-density lesion. No new or progressive metastatic disease within the abdomen or pelvis. 2. Similar trace free pelvic fluid. 3. Similar nonspecific mid rectal wall thickening. 4.  Aortic Atherosclerosis (ICD10-I70.0).   04/23/2021 Imaging   CT CAP  IMPRESSION: 1. Treated metastatic lesion between segments 2 and 3 of the liver, slightly smaller and less distinct than prior examination. No other signs of definite metastatic disease elsewhere in the abdomen or pelvis. 2. Multiple small pulmonary nodules, stable compared to the prior examination, favored to be benign. No definitive findings to suggest metastatic disease to the thorax. 3. Aortic atherosclerosis. 4. Additional incidental findings, as above.   08/13/2021 Imaging   EXAM: CT CHEST, ABDOMEN, AND PELVIS WITH CONTRAST  IMPRESSION: 1. A previously seen PET avid lesion of the anterior left lobe of the liver, hepatic segment II, is no longer discretely appreciable consistent with treatment response of a hepatic metastasis. 2. No evidence of new metastatic disease in the chest, abdomen, or pelvis. 3. Interval increase in a small focus of consolidation and nodularity  of the medial left upper lobe, consistent with minimal, ongoing atypical infection. Additional tiny bilateral pulmonary nodules are stable and almost certainly incidental benign. Attention on follow-up. 4. Status post right hemicolectomy and ileocolic anastomosis.   07/02/2022 Imaging    IMPRESSION: CHEST IMPRESSION:   1. No evidence of thoracic metastasis. 2. Stable small pulmonary nodules.    PELVIS IMPRESSION:   1. Stable to slight decrease in size of subcapsular lesion in the RIGHT hepatic lobe. 2. No evidence of new or progressive disease in the abdomen pelvis. 3.  Aortic Atherosclerosis (ICD10-I70.0).     Rectal cancer metastasized to liver ([place])  06/15/2020 Procedure   Screening Colonoscopy by Dr [name]  IMPRESSION - Decreased sphincter tone and internal hemorrhoids that prolapse with straining, but require manual replacement into the anal canal (Grade III) found on digital rectal exam. - Patent side-to-side ileo-colonic anastomosis, characterized by healthy appearing mucosa. - The examined portion of the ileum was normal. - One diminutive polyp in the proximal transverse colon, removed with a cold biopsy forceps. Resected and retrieved. - Likely malignant partially obstructing tumor in the mid rectum. Biopsied. Tattooed. - The examination was otherwise normal on direct and retroflexion views.   06/15/2020 Initial Biopsy   Diagnosis 1. Transverse Colon Polyp - HYPERPLASTIC POLYP 2. Rectum, biopsy - ADENOCARCINOMA ARISING IN A TUBULAR ADENOMA WITH HIGH-GRADE DYSPLASIA. SEE NOTE Diagnosis Note 2. Dr. [name] reviewed the case and concurs with the diagnosis. Dr. [name] was notified on 06/16/2020.   06/28/2020 Imaging   CT CAP  IMPRESSION: 1. New low-density focus in the anterior aspect of the lateral segment LEFT hepatic lobe measuring 1.2 x 1.0 cm, compatible with small metastatic lesion in the LEFT hepatic lobe. 2. Soft tissue in the RIGHT iliac fossa following RIGHT hemicolectomy invades the psoas musculature and is slowly enlarging over time, more linear on the prior study now highly concerning for recurrence/metastasis to this location. 3. Signs of enteritis, potentially post radiation changes of the small bowel. Tethered small bowel in the RIGHT lower quadrant shows focal thickening and narrowing suspicious for small bowel involvement and developing  partial obstruction though currently contrast passes beyond this point into the colon. 4. Rectal thickening in this patient with known rectal mass as described. 5. No evidence of metastatic disease in the chest. 6. Stable small pulmonary nodules. 7.  and aortic atherosclerosis.   Aortic Atherosclerosis (ICD10-I70.0) and Emphysema (ICD10-J43.9).   07/04/2020 Initial Diagnosis   Rectal cancer metastasized to liver ([place])   07/12/2020 PET scan   IMPRESSION: 1. Exam positive for FDG avid rectal tumor which corresponds to the recent colonoscopy findings. 2. FDG avid soft tissue mass within the right iliac fossa is noted and consistent with local tumor recurrence from previous ascending colon tumor. 3. Lateral segment left lobe of liver lesion is FDG avid concerning for liver metastasis. 4. No specific findings identified to suggest metastatic disease to the chest.   08/10/2020 -  Chemotherapy   First-line FOLFIRI q2weeks starting 08/10/20. dose reduced with cycle 1. Irinotecan/5FU increased and Bevacizumab added with cycle 2 on 08/23/2020    08/17/2020 - 03/09/2022 Chemotherapy   Patient is on Treatment Plan : COLORECTAL FOLFIRI + Bevacizumab q14d     10/27/2020 Imaging   CT CAP  IMPRESSION: 1. Interval decrease in size of the hypermetabolic left hepatic lesion, consistent with metastatic disease. No new liver lesion evident. 2. Interval resolution of the hypermetabolic soft tissue lesion along the right iliac fossa with no measurable soft tissue lesion  remaining at this location today. 3. Similar appearance of soft tissue fullness in the rectum at the site of the hypermetabolic lesion seen previously. 4. Stable tiny bilateral pulmonary nodules. Continued attention on follow-up recommended. 5. Small volume free fluid in the pelvis. 6. Aortic Atherosclerosis (ICD10-I70.0).   01/22/2021 Imaging   CT CAP  IMPRESSION: CT CHEST IMPRESSION   1. Similar nonspecific pulmonary  nodules. 2. New posterior left upper lobe reticulonodular opacity, suspicious for interval mild infection or inflammation. 3. No thoracic adenopathy.   CT ABDOMEN AND PELVIS IMPRESSION   1. Further decrease in size of high left hepatic lobe 3 mm low-density lesion. No new or progressive metastatic disease within the abdomen or pelvis. 2. Similar trace free pelvic fluid. 3. Similar nonspecific mid rectal wall thickening. 4.  Aortic Atherosclerosis (ICD10-I70.0).   04/23/2021 Imaging   CT CAP  IMPRESSION: 1. Treated metastatic lesion between segments 2 and 3 of the liver, slightly smaller and less distinct than prior examination. No other signs of definite metastatic disease elsewhere in the abdomen or pelvis. 2. Multiple small pulmonary nodules, stable compared to the prior examination, favored to be benign. No definitive findings to suggest metastatic disease to the thorax. 3. Aortic atherosclerosis. 4. Additional incidental findings, as above.   08/13/2021 Imaging   EXAM: CT CHEST, ABDOMEN, AND PELVIS WITH CONTRAST  IMPRESSION: 1. A previously seen PET avid lesion of the anterior left lobe of the liver, hepatic segment II, is no longer discretely appreciable consistent with treatment response of a hepatic metastasis. 2. No evidence of new metastatic disease in the chest, abdomen, or pelvis. 3. Interval increase in a small focus of consolidation and nodularity of the medial left upper lobe, consistent with minimal, ongoing atypical infection. Additional tiny bilateral pulmonary nodules are stable and almost certainly incidental benign. Attention on follow-up. 4. Status post right hemicolectomy and ileocolic anastomosis.   03/28/2022 -  Chemotherapy   Patient is on Treatment Plan : COLORECTAL FOLFOX + Bevacizumab q14d        INTERVAL HISTORY:  Brittney Spencer is here for a follow up of metastatic rectal cancer, h/o colon cancer      She was last seen by me on  08/01/2022 She presents to the clinic aloe. Pt state she is experiencing some fatigue from this last treatment. Pt report that she is still able to do her household chores just a little slower. Pt state she has some nasal congestion. Pt denies numbness and tingling, but have the cold sensitivity. Pt is having post nasal drainage . Pt reports she haven't had  this before.      All other systems were reviewed with the patient and are negative.  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 Some residual intraretinal fluid remains, very small localized subfoveal of fluid remains although this continues to slowly resorb.  We'll continue to observe.   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  ALLERGIES:  is allergic to venofer [iron  sucrose], fish allergy, peanut-containing drug products, soy allergy, and buspirone.  MEDICATIONS:  Current Outpatient Medications  Medication Sig Dispense Refill   ALPRAZolam (XANAX) 0.25 MG tablet Take 1 tablet (0.25 mg total) by mouth daily as needed for anxiety. 30 tablet 0   Cholecalciferol (VITAMIN D3 PO) Take by mouth daily.     docusate sodium (COLACE) 100 MG capsule 1 capsule as needed     lidocaine-prilocaine (EMLA) cream Apply 1 application topically as needed. 30 g 1   NORVASC 2.5 MG tablet Take 1 tablet (2.5 mg total) by mouth daily. 90 tablet 1   ondansetron (ZOFRAN) 8 MG tablet Take 1 tablet (8 mg total) by mouth every 8 (eight) hours as needed for nausea or vomiting. 20 tablet 2   prochlorperazine (COMPAZINE) 10 MG tablet Take 1 tablet (10 mg total) by mouth every 6 (six) hours as needed for nausea or vomiting. 30 tablet 2   Current Facility-Administered Medications  Medication Dose Route Frequency Provider Last Rate Last Admin   0.9 %  sodium chloride infusion  500 mL Intravenous Once Doran Stabler, MD       Facility-Administered Medications Ordered in Other Visits  Medication Dose Route Frequency Provider Last Rate Last Admin   fluorouracil (ADRUCIL) 2,500 mg in sodium chloride 0.9 % 100 mL chemo infusion  1,800 mg/m2 (Order-Specific) Intravenous 1 day or 1 dose Brittney Merle, MD   Infusion Verify at 08/15/22 1643   loratadine (CLARITIN) tablet 10 mg  10 mg Oral Daily Brittney Merle, MD   10 mg at 08/15/22 [id number]   prochlorperazine (COMPAZINE) tablet 10 mg  10 mg Oral Q6H PRN Brittney Merle, MD   10 mg at 08/15/22 1537   sodium chloride flush (NS) 0.9 % injection 10 mL  10 mL Intracatheter PRN Brittney Merle, MD        PHYSICAL EXAMINATION: ECOG PERFORMANCE STATUS: 2 - Symptomatic, <50% confined to bed  Vitals:   08/15/22 0830  BP: 129/81  Pulse: 81  Resp: 15  Temp: 98.2 F (36.8 C)  SpO2: 100%   Wt Readings from Last 3 Encounters:  08/15/22 104 lb (47.2 kg)  08/01/22 106 lb  4.8 oz (48.2 kg)  07/18/22 106 lb (48.1 kg)     GENERAL:alert, no distress and comfortable SKIN: skin color, texture, turgor are normal, no rashes or significant lesions EYES: normal, Conjunctiva are pink and non-injected, sclera clear NECK: supple, thyroid normal size, non-tender, without nodularity LYMPH:  no palpable lymphadenopathy in the cervical, axillary  LUNGS:(-) clear to auscultation and percussion with normal breathing effort HEART: (-)regular rate & rhythm and no murmurs and no lower extremity edema ABDOMEN:abdomen soft, non-tender and normal bowel sounds Musculoskeletal:no cyanosis of digits and no clubbing  NEURO: alert & oriented x 3 with fluent speech, no focal motor/sensory deficits  LABORATORY DATA:  I have reviewed the data as listed    Latest Ref Rng & Units 08/15/2022    8:05 AM 08/01/2022    7:34 AM 07/18/2022    7:12 AM  CBC  WBC 4.0 - 10.5 K/uL 5.4  5.1  5.2   Hemoglobin 12.0 - 15.0 g/dL 9.4  8.4  8.7   Hematocrit 36.0 - 46.0 % 30.0  27.4  28.3   Platelets 150 - 400 K/uL 114  111  128         Latest Ref Rng & Units 08/15/2022    8:05 AM 08/01/2022    7:34 AM 07/18/2022    7:12 AM  CMP  Glucose 70 - 99 mg/dL 81  80  81   BUN 8 - 23 mg/dL 12  12  9   $ Creatinine 0.44 - 1.00 mg/dL 0.77  0.62  0.68   Sodium 135 - 145 mmol/L 137  139  138   Potassium 3.5 - 5.1 mmol/L 4.0  4.0  4.2   Chloride 98 - 111 mmol/L 104  108  108   CO2 22 - 32 mmol/L 24  25  27   $ Calcium 8.9 - 10.3 mg/dL 8.6  8.5  8.7   Total Protein 6.5 - 8.1 g/dL 6.8  6.4  6.3   Total Bilirubin 0.3 - 1.2 mg/dL 0.5  0.2  0.3   Alkaline Phos 38 - 126 U/L 81  86  89   AST 15 - 41 U/L 22  19  20   $ ALT 0 - 44 U/L 11  8  9       $ RADIOGRAPHIC STUDIES: I have personally reviewed the radiological images as listed and agreed with the findings in the report. No results found.    Orders Placed This Encounter  Procedures   CBC with Differential ([facility] Only)    Standing Status:   Future     Standing Expiration Date:   09/13/2023   CMP ([facility] only)    Standing Status:   Future    Standing Expiration Date:   09/13/2023   Total Protein, Urine dipstick    Standing Status:   Future    Standing Expiration Date:   09/13/2023   CBC with Differential ([facility] Only)    Standing Status:   Future    Standing Expiration Date:   09/27/2023   CMP ([facility] only)    Standing Status:   Future    Standing Expiration Date:   09/27/2023   Total Protein, Urine dipstick    Standing Status:   Future    Standing Expiration Date:   09/27/2023   All questions were answered. The patient knows to call the clinic with any problems, questions or concerns. No barriers to learning was detected. The total time spent in the appointment was 30 minutes.     Brittney Merle, MD 08/15/2022   Felicity Coyer, CMA, am acting as scribe for Brittney Merle, MD.   I have reviewed the above documentation for accuracy and completeness, and I agree with the above.

## 2022-08-15 ENCOUNTER — Encounter: Payer: Self-pay | Admitting: Hematology

## 2022-08-15 ENCOUNTER — Other Ambulatory Visit: Payer: Self-pay

## 2022-08-15 ENCOUNTER — Inpatient Hospital Stay: Payer: Federal, State, Local not specified - PPO

## 2022-08-15 ENCOUNTER — Inpatient Hospital Stay (HOSPITAL_BASED_OUTPATIENT_CLINIC_OR_DEPARTMENT_OTHER): Payer: Federal, State, Local not specified - PPO | Admitting: Hematology

## 2022-08-15 VITALS — BP 141/75 | HR 74 | Resp 16

## 2022-08-15 VITALS — BP 129/81 | HR 81 | Temp 98.2°F | Resp 15 | Ht 66.0 in | Wt 104.0 lb

## 2022-08-15 DIAGNOSIS — D500 Iron deficiency anemia secondary to blood loss (chronic): Secondary | ICD-10-CM | POA: Diagnosis not present

## 2022-08-15 DIAGNOSIS — C20 Malignant neoplasm of rectum: Secondary | ICD-10-CM

## 2022-08-15 DIAGNOSIS — Z5112 Encounter for antineoplastic immunotherapy: Secondary | ICD-10-CM | POA: Diagnosis not present

## 2022-08-15 DIAGNOSIS — Z95828 Presence of other vascular implants and grafts: Secondary | ICD-10-CM

## 2022-08-15 DIAGNOSIS — Z66 Do not resuscitate: Secondary | ICD-10-CM

## 2022-08-15 DIAGNOSIS — C182 Malignant neoplasm of ascending colon: Secondary | ICD-10-CM | POA: Diagnosis not present

## 2022-08-15 DIAGNOSIS — C787 Secondary malignant neoplasm of liver and intrahepatic bile duct: Secondary | ICD-10-CM

## 2022-08-15 LAB — CMP (CANCER CENTER ONLY)
ALT: 11 U/L (ref 0–44)
AST: 22 U/L (ref 15–41)
Albumin: 3.3 g/dL — ABNORMAL LOW (ref 3.5–5.0)
Alkaline Phosphatase: 81 U/L (ref 38–126)
Anion gap: 9 (ref 5–15)
BUN: 12 mg/dL (ref 8–23)
CO2: 24 mmol/L (ref 22–32)
Calcium: 8.6 mg/dL — ABNORMAL LOW (ref 8.9–10.3)
Chloride: 104 mmol/L (ref 98–111)
Creatinine: 0.77 mg/dL (ref 0.44–1.00)
GFR, Estimated: >60 mL/min (ref >60)
Glucose, Bld: 81 mg/dL (ref 70–99)
Potassium: 4 mmol/L (ref 3.5–5.1)
Sodium: 137 mmol/L (ref 135–145)
Total Bilirubin: 0.5 mg/dL (ref 0.3–1.2)
Total Protein: 6.8 g/dL (ref 6.5–8.1)

## 2022-08-15 LAB — CBC WITH DIFFERENTIAL (CANCER CENTER ONLY)
Abs Immature Granulocytes: 0.02 10*3/uL (ref 0.00–0.07)
Basophils Absolute: 0 10*3/uL (ref 0.0–0.1)
Basophils Relative: 0 %
Eosinophils Absolute: 0 10*3/uL (ref 0.0–0.5)
Eosinophils Relative: 1 %
HCT: 30 % — ABNORMAL LOW (ref 36.0–46.0)
Hemoglobin: 9.4 g/dL — ABNORMAL LOW (ref 12.0–15.0)
Immature Granulocytes: 0 %
Lymphocytes Relative: 16 %
Lymphs Abs: 0.9 10*3/uL (ref 0.7–4.0)
MCH: 25.7 pg — ABNORMAL LOW (ref 26.0–34.0)
MCHC: 31.3 g/dL (ref 30.0–36.0)
MCV: 82 fL (ref 80.0–100.0)
Monocytes Absolute: 0.7 10*3/uL (ref 0.1–1.0)
Monocytes Relative: 13 %
Neutro Abs: 3.7 10*3/uL (ref 1.7–7.7)
Neutrophils Relative %: 70 %
Platelet Count: 114 10*3/uL — ABNORMAL LOW (ref 150–400)
RBC: 3.66 MIL/uL — ABNORMAL LOW (ref 3.87–5.11)
RDW: 18.7 % — ABNORMAL HIGH (ref 11.5–15.5)
WBC Count: 5.4 10*3/uL (ref 4.0–10.5)
nRBC: 0 % (ref 0.0–0.2)

## 2022-08-15 LAB — FERRITIN: Ferritin: 34 ng/mL (ref 11–307)

## 2022-08-15 LAB — CEA (IN HOUSE-CHCC): CEA (CHCC-In House): 16.45 ng/mL — ABNORMAL HIGH (ref 0.00–5.00)

## 2022-08-15 LAB — TOTAL PROTEIN, URINE DIPSTICK: Protein, ur: NEGATIVE mg/dL

## 2022-08-15 MED ORDER — SODIUM CHLORIDE 0.9 % IV SOLN: Freq: Once | INTRAVENOUS | Status: AC

## 2022-08-15 MED ORDER — SODIUM CHLORIDE 0.9 % IV SOLN
300.0000 mg | Freq: Once | INTRAVENOUS | Status: AC
  Administered 2022-08-15: 300 mg via INTRAVENOUS
  Filled 2022-08-15: qty 300

## 2022-08-15 MED ORDER — SODIUM CHLORIDE 0.9% FLUSH: 10.0000 mL | INTRAVENOUS | Status: DC | PRN

## 2022-08-15 MED ORDER — PALONOSETRON HCL INJECTION 0.25 MG/5ML
0.2500 mg | Freq: Once | INTRAVENOUS | Status: AC
  Administered 2022-08-15: 0.25 mg via INTRAVENOUS
  Filled 2022-08-15: qty 5

## 2022-08-15 MED ORDER — SODIUM CHLORIDE 0.9 % IV SOLN
5.0000 mg/kg | Freq: Once | INTRAVENOUS | Status: AC
  Administered 2022-08-15: 250 mg via INTRAVENOUS
  Filled 2022-08-15: qty 10

## 2022-08-15 MED ORDER — FAMOTIDINE IN NACL 20-0.9 MG/50ML-% IV SOLN
20.0000 mg | Freq: Once | INTRAVENOUS | Status: AC | PRN
  Administered 2022-08-15: 20 mg via INTRAVENOUS

## 2022-08-15 MED ORDER — SODIUM CHLORIDE 0.9 % IV SOLN
10.0000 mg | Freq: Once | INTRAVENOUS | Status: AC
  Administered 2022-08-15: 10 mg via INTRAVENOUS
  Filled 2022-08-15: qty 10

## 2022-08-15 MED ORDER — SODIUM CHLORIDE 0.9 % IV SOLN
1800.0000 mg/m2 | INTRAVENOUS | Status: DC
  Administered 2022-08-15: 2500 mg via INTRAVENOUS
  Filled 2022-08-15: qty 50

## 2022-08-15 MED ORDER — SODIUM CHLORIDE 0.9% FLUSH
10.0000 mL | Freq: Once | INTRAVENOUS | Status: AC
  Administered 2022-08-15: 10 mL

## 2022-08-15 MED ORDER — OXALIPLATIN CHEMO INJECTION 100 MG/20ML
60.0000 mg/m2 | Freq: Once | INTRAVENOUS | Status: AC
  Administered 2022-08-15: 100 mg via INTRAVENOUS
  Filled 2022-08-15: qty 20

## 2022-08-15 MED ORDER — DEXTROSE 5 % IV SOLN: Freq: Once | INTRAVENOUS | Status: AC

## 2022-08-15 MED ORDER — PROCHLORPERAZINE MALEATE 10 MG PO TABS
10.0000 mg | ORAL_TABLET | Freq: Four times a day (QID) | ORAL | Status: DC | PRN
  Administered 2022-08-15: 10 mg via ORAL
  Filled 2022-08-15: qty 1

## 2022-08-15 MED ORDER — DIPHENHYDRAMINE HCL 50 MG/ML IJ SOLN
50.0000 mg | Freq: Once | INTRAMUSCULAR | Status: AC | PRN
  Administered 2022-08-15: 25 mg via INTRAVENOUS

## 2022-08-15 MED ORDER — LEUCOVORIN CALCIUM INJECTION 350 MG
400.0000 mg/m2 | Freq: Once | INTRAVENOUS | Status: AC
  Administered 2022-08-15: 600 mg via INTRAVENOUS
  Filled 2022-08-15: qty 30

## 2022-08-15 MED ORDER — LORATADINE 10 MG PO TABS
10.0000 mg | ORAL_TABLET | Freq: Every day | ORAL | Status: DC
  Administered 2022-08-15: 10 mg via ORAL
  Filled 2022-08-15: qty 1

## 2022-08-15 NOTE — Assessment & Plan Note (Signed)
diagnosed in 12/2015. S/p right hemicolectomy with right salpingo oophorectomy and adjuvant ChemoRT. 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

## 2022-08-15 NOTE — Patient Instructions (Signed)
[facility]  Discharge Instructions: Thank you for choosing [facility] to provide your oncology and hematology care.   If you have a lab appointment with the [facility], please go directly to the [facility] and check in at the registration area.   Wear comfortable clothing and clothing appropriate for easy access to any Portacath or PICC line.   We strive to give you quality time with your provider. You may need to reschedule your appointment if you arrive late (15 or more minutes).  Arriving late affects you and other patients whose appointments are after yours.  Also, if you miss three or more appointments without notifying the office, you may be dismissed from the clinic at the provider's discretion.      For prescription refill requests, have your pharmacy contact our office and allow 72 hours for refills to be completed.    Today you received the following chemotherapy and/or immunotherapy agents Mvasi, Oxaliplatin, Leucovorin & 5FU pump      To help prevent nausea and vomiting after your treatment, we encourage you to take your nausea medication as directed.  BELOW ARE SYMPTOMS THAT SHOULD BE REPORTED IMMEDIATELY: *FEVER GREATER THAN 100.4 F (38 C) OR HIGHER *CHILLS OR SWEATING *NAUSEA AND VOMITING THAT IS NOT CONTROLLED WITH YOUR NAUSEA MEDICATION *UNUSUAL SHORTNESS OF BREATH *UNUSUAL BRUISING OR BLEEDING *URINARY PROBLEMS (pain or burning when urinating, or frequent urination) *BOWEL PROBLEMS (unusual diarrhea, constipation, pain near the anus) TENDERNESS IN MOUTH AND THROAT WITH OR WITHOUT PRESENCE OF ULCERS (sore throat, sores in mouth, or a toothache) UNUSUAL RASH, SWELLING OR PAIN  UNUSUAL VAGINAL DISCHARGE OR ITCHING   Items with * indicate a potential emergency and should be followed up as soon as possible or go to the Emergency Department if any problems should occur.  Please show the CHEMOTHERAPY ALERT CARD or  IMMUNOTHERAPY ALERT CARD at check-in to the Emergency Department and triage nurse.  Should you have questions after your visit or need to cancel or reschedule your appointment, please contact [facility]  Dept: [phone]  and follow the prompts.  Office hours are 8:00 a.m. to 4:30 p.m. Monday - Friday. Please note that voicemails left after 4:00 p.m. may not be returned until the following business day.  We are closed weekends and major holidays. You have access to a nurse at all times for urgent questions. Please call the main number to the clinic Dept: [phone] and follow the prompts.   For any non-urgent questions, you may also contact your provider using MyChart. We now offer e-Visits for anyone 21 and older to request care online for non-urgent symptoms. For details visit [email or web address].   Also download the MyChart app! Go to the app store, search "MyChart", open the app, select [facility], and log in with your MyChart username and password.  Iron Sucrose Injection What is this medication? IRON SUCROSE (EYE ern SOO krose) treats low levels of iron (iron deficiency anemia) in people with kidney disease. Iron is a mineral that plays an important role in making red blood cells, which carry oxygen from your lungs to the rest of your body. This medicine may be used for other purposes; ask your health care provider or pharmacist if you have questions. COMMON BRAND NAME(S): Venofer What should I tell my care team before I take this medication? They need to know if you have any of these conditions: Anemia not caused  by low iron levels Heart disease High levels of iron in the blood Kidney disease Liver disease An unusual or allergic reaction to iron, other medications, foods, dyes, or preservatives Pregnant or trying to get pregnant Breastfeeding How should I use this medication? This medication is for infusion into a vein. It is given in a  hospital or clinic setting. Talk to your care team about the use of this medication in children. While this medication may be prescribed for children as young as 2 years for selected conditions, precautions do apply. Overdosage: If you think you have taken too much of this medicine contact a poison control center or emergency room at once. NOTE: This medicine is only for you. Do not share this medicine with others. What if I miss a dose? Keep appointments for follow-up doses. It is important not to miss your dose. Call your care team if you are unable to keep an appointment. What may interact with this medication? Do not take this medication with any of the following: Deferoxamine Dimercaprol Other iron products This medication may also interact with the following: Chloramphenicol Deferasirox This list may not describe all possible interactions. Give your health care provider a list of all the medicines, herbs, non-prescription drugs, or dietary supplements you use. Also tell them if you smoke, drink alcohol, or use illegal drugs. Some items may interact with your medicine. What should I watch for while using this medication? Visit your care team regularly. Tell your care team if your symptoms do not start to get better or if they get worse. You may need blood work done while you are taking this medication. You may need to follow a special diet. Talk to your care team. Foods that contain iron include: whole grains/cereals, dried fruits, beans, or peas, leafy green vegetables, and organ meats (liver, kidney). What side effects may I notice from receiving this medication? Side effects that you should report to your care team as soon as possible: Allergic reactions--skin rash, itching, hives, swelling of the face, lips, tongue, or throat Low blood pressure--dizziness, feeling faint or lightheaded, blurry vision Shortness of breath Side effects that usually do not require medical attention (report  to your care team if they continue or are bothersome): Flushing Headache Joint pain Muscle pain Nausea Pain, redness, or irritation at injection site This list may not describe all possible side effects. Call your doctor for medical advice about side effects. You may report side effects to FDA at 1-800-FDA-1088. Where should I keep my medication? This medication is given in a hospital or clinic and will not be stored at home. NOTE: This sheet is a summary. It may not cover all possible information. If you have questions about this medicine, talk to your doctor, pharmacist, or health care provider.  2023 Elsevier/Gold Standard (2020-09-28 00:00:00)

## 2022-08-15 NOTE — Assessment & Plan Note (Signed)
will monitor iron level  -no previous iv iron  -worse lately -will monitor iron level

## 2022-08-15 NOTE — Assessment & Plan Note (Signed)
-  pt has a living will and agrees with DNR, this is documented in her chart

## 2022-08-15 NOTE — Assessment & Plan Note (Signed)
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  -She began first-line chemo with dose-reduced FOLFIRI and Beva in 2/22.  -we changed to FOLFOX with continued Beva on 03/28/22. She tolerated well overall with some fatigue and cold sensitivity. -restaging CT on 07/02/2022 showed overall SD with slight improvement, no new lesions -She is tolerating chemotherapy overall well, will continue.

## 2022-08-15 NOTE — Progress Notes (Signed)
Hypersensitivity Reaction note  Date of event: 08/15/22 Time of event: 1220-1305 Generic name of drug involved: iron sucrose Name of provider notified of the hypersensitivity reaction: Burr Medico Was agent that likely caused hypersensitivity reaction added to Allergies List within EMR? yes Chain of events including reaction signs/symptoms, treatment administered, and outcome (e.g., drug resumed; drug discontinued; sent to Emergency Department; etc.) Patient received venofer 300 mg for the first time, did well during infusion, at end of 30-minute post observation, she started to C/O some mid & upper abdominal pain, nausea & some sweating.  Dr Burr Medico informed.  Patient's bevacizumab started, BP WNL.  Patient tolerated bevacizumab - continued to C/O some abdominal pain & mild nausea.  Oxaliplatin & leucovorin started at 1244.  Received orders from Dr Burr Medico at 1300 to give benadryl 25 mg IV & pepcid IV.  Patient vomited large amount of yellow emesis at 1305.  Oxaliplatin & leucovorin paused, line flushed with D5.  Benadryl & Pepcid given as documented on MAR.  Line flushed, Oxaliplatin & Leucovorin restarted at 1341. Port dressing changed, soiled with emesis Patient states her abdominal pain is much better, very slight sensation of nausea. VS WNL.  Patient began vomiting after infusion completed as 5FU pump being applied.  Dr Burr Medico informed, she gave order for compazine po & to observe patient 20-30 minutes.  Compazine given, patient states she feels better.  Per Dr Burr Medico 5FU pump to run over 44 hours due to timing of pump DC on Saturday, 08/17/22.  Rate is 3.4 ml/hr, verified with Donato Heinz RN.    Janalyn Shy, RN 08/15/2022 2:06 PM

## 2022-08-17 ENCOUNTER — Inpatient Hospital Stay: Payer: Federal, State, Local not specified - PPO

## 2022-08-17 VITALS — BP 149/83 | HR 98 | Temp 99.5°F | Resp 16

## 2022-08-17 DIAGNOSIS — C20 Malignant neoplasm of rectum: Secondary | ICD-10-CM

## 2022-08-17 DIAGNOSIS — Z5112 Encounter for antineoplastic immunotherapy: Secondary | ICD-10-CM | POA: Diagnosis not present

## 2022-08-17 DIAGNOSIS — Z95828 Presence of other vascular implants and grafts: Secondary | ICD-10-CM

## 2022-08-17 MED ORDER — SODIUM CHLORIDE 0.9% FLUSH
10.0000 mL | Freq: Once | INTRAVENOUS | Status: AC
  Administered 2022-08-17: 10 mL

## 2022-08-17 MED ORDER — HEPARIN SOD (PORK) LOCK FLUSH 100 UNIT/ML IV SOLN: 250.0000 [IU] | Freq: Once | INTRAVENOUS | Status: DC | PRN

## 2022-08-17 MED ORDER — PEGFILGRASTIM-JMDB 6 MG/0.6ML ~~LOC~~ SOSY
6.0000 mg | PREFILLED_SYRINGE | Freq: Once | SUBCUTANEOUS | Status: AC
  Administered 2022-08-17: 6 mg via SUBCUTANEOUS

## 2022-08-17 MED ORDER — ALTEPLASE 2 MG IJ SOLR: 2.0000 mg | Freq: Once | INTRAMUSCULAR | Status: DC | PRN

## 2022-08-17 MED ORDER — SODIUM CHLORIDE 0.9% FLUSH: 10.0000 mL | INTRAVENOUS | Status: DC | PRN

## 2022-08-17 MED ORDER — SODIUM CHLORIDE 0.9% FLUSH: 3.0000 mL | INTRAVENOUS | Status: DC | PRN

## 2022-08-17 MED ORDER — COLD PACK MISC ONCOLOGY: 1.0000 | Freq: Once | Status: DC | PRN

## 2022-08-17 MED ORDER — HEPARIN SOD (PORK) LOCK FLUSH 100 UNIT/ML IV SOLN
500.0000 [IU] | Freq: Once | INTRAVENOUS | Status: AC
  Administered 2022-08-17: 500 [IU]

## 2022-08-17 MED ORDER — HEPARIN SOD (PORK) LOCK FLUSH 100 UNIT/ML IV SOLN: 500.0000 [IU] | Freq: Once | INTRAVENOUS | Status: DC | PRN

## 2022-08-18 ENCOUNTER — Other Ambulatory Visit: Payer: Self-pay

## 2022-08-19 ENCOUNTER — Telehealth: Payer: Self-pay | Admitting: Hematology

## 2022-08-19 NOTE — Telephone Encounter (Signed)
Patient called to verify appointment date and times.

## 2022-08-22 ENCOUNTER — Inpatient Hospital Stay (HOSPITAL_BASED_OUTPATIENT_CLINIC_OR_DEPARTMENT_OTHER): Payer: Federal, State, Local not specified - PPO | Admitting: Physician Assistant

## 2022-08-22 ENCOUNTER — Other Ambulatory Visit: Payer: Self-pay

## 2022-08-22 ENCOUNTER — Inpatient Hospital Stay: Payer: Federal, State, Local not specified - PPO

## 2022-08-22 VITALS — BP 136/82 | HR 88 | Temp 98.7°F | Resp 15

## 2022-08-22 DIAGNOSIS — D500 Iron deficiency anemia secondary to blood loss (chronic): Secondary | ICD-10-CM | POA: Diagnosis not present

## 2022-08-22 DIAGNOSIS — Z95828 Presence of other vascular implants and grafts: Secondary | ICD-10-CM

## 2022-08-22 DIAGNOSIS — C20 Malignant neoplasm of rectum: Secondary | ICD-10-CM

## 2022-08-22 DIAGNOSIS — Z5112 Encounter for antineoplastic immunotherapy: Secondary | ICD-10-CM | POA: Diagnosis not present

## 2022-08-22 DIAGNOSIS — C787 Secondary malignant neoplasm of liver and intrahepatic bile duct: Secondary | ICD-10-CM

## 2022-08-22 MED ORDER — ONDANSETRON HCL 4 MG/2ML IJ SOLN
INTRAMUSCULAR | Status: AC
  Filled 2022-08-22: qty 2

## 2022-08-22 MED ORDER — ONDANSETRON HCL 4 MG/2ML IJ SOLN
4.0000 mg | Freq: Once | INTRAMUSCULAR | Status: AC
  Administered 2022-08-22: 4 mg via INTRAVENOUS

## 2022-08-22 MED ORDER — FAMOTIDINE IN NACL 20-0.9 MG/50ML-% IV SOLN
20.0000 mg | Freq: Once | INTRAVENOUS | Status: AC
  Administered 2022-08-22: 20 mg via INTRAVENOUS
  Filled 2022-08-22: qty 50

## 2022-08-22 MED ORDER — DIPHENHYDRAMINE HCL 25 MG PO CAPS
25.0000 mg | ORAL_CAPSULE | Freq: Once | ORAL | Status: AC
  Administered 2022-08-22: 25 mg via ORAL
  Filled 2022-08-22: qty 1

## 2022-08-22 MED ORDER — SODIUM CHLORIDE 0.9 % IV SOLN
300.0000 mg | Freq: Once | INTRAVENOUS | Status: AC
  Administered 2022-08-22: 300 mg via INTRAVENOUS
  Filled 2022-08-22: qty 300

## 2022-08-22 MED ORDER — SODIUM CHLORIDE 0.9 % IV SOLN: Freq: Once | INTRAVENOUS | Status: AC

## 2022-08-22 NOTE — Progress Notes (Signed)
Symptom Management Consult note [place]    Patient Care Team: Patient, No Pcp Per as PCP - General (General Practice) [name], NP as Nurse Practitioner (Oncology) [name], MD as Consulting Physician (Oncology) [name], RN (Inactive) as Oncology Nurse Navigator    Name / MRN / DOB: Brittney Spencer  [id number]  July 13, 1950   Date of visit: 08/22/2022   Chief Complaint/Reason for visit: nausea and leg swelling   Current Therapy: Venofer 300    ASSESSMENT & PLAN: Patient is a 72 y.o. female  with oncologic history of iron deficiency anemia due to chronic blood loss and rectal cancer metastasized to liver followed by Dr. Burr Medico.  I have viewed most recent oncology note and lab work.    # Rectal cancer metastasized to liver - Next appointment with oncologist is 08/29/22   # Iron Deficiency anemia due to chronic blood loss -Chart review shows this is patient's second iron infusion.  Her first was on 08/15/2022.  She also received chemotherapy that day.  Patient had nausea and vomiting during treatment and needed multiple IV medications.  -Patient with nausea and sensation of leg tightness today.  On exam she is nontoxic-appearing.  Has benign abdominal exam.  No swelling appreciated to lower extremities.  Lower extremities are neurovascularly intact. -Patient ambulated to the bathroom and tightness sensation improved.  No gait difficulty. -She was given Zofran for nausea and it resolved.  Symptoms do not indicate anaphylaxis. -Dr. Burr Medico notified and agreeable with plan to discharge patient as symptoms are improving. Zofran and tylenol will be added as pre medications for future iron infusions.  Patient agreeable with plan of care.  Strict ED precautions discussed should symptoms worsen.    Heme/Onc History: Oncology History Overview Note  Cancer Staging Cancer of ascending colon [facility]) Staging form: Colon and Rectum, AJCC 7th Edition -  Clinical stage from 02/07/2016: Stage IIIC (T4b, N1b, M0) - Signed by [name], MD on 03/04/2016 Laterality: Right Residual tumor (R): R2 - Macroscopic    Cancer of ascending colon ([place])  10/25/2015 Imaging   CT ABD/PELVIS:  Inflammatory changes inferior to the cecal tip appear improved, there is still irregular soft tissue thickening of the cecal tip, and there are adjacent prominent lymph nodes in the ileocolonic mesentery, measuring 13 mm on image 49 and 8 mm on image 52. In addition, there is a 2.5 x 1.8 cm nodule on image 46 which has central low density. Therefore, these findings are moderately suspicious for an underlying cecal malignancy with perforation.    01/19/2016 Procedure   COLONOSCOPY per Dr. [name]: Fungating, ulcerated mass almost obstructing mid ascending colon   01/19/2016 Initial Biopsy   Diagnosis Surgical [P], cecal mass - INVASIVE ADENOCARCINOMA WITH ULCERATION. - SEE COMMENT.   02/05/2016 Tumor Marker   Patient's tumor was tested for the following markers: CEA Results of the tumor marker test revealed 5.7.   02/07/2016 Initial Diagnosis   Cancer of ascending colon ([place])   02/07/2016 Definitive Surgery   Laparoscopic assisted right hemicolectomy and right salpingo oopherectomy--Dr. [name]   02/07/2016 Pathologic Stage   p T4 N1b   2/43 nodes +   02/07/2016 Pathology Results   MMR normal; G2 adenocarcinoma;proximal & distal margins negative; soft tissue mass on pelvic sidewall + for adenocarcinoma with positive margin MSI Stable   03/08/2016 Imaging   CT chest negative for metastasis.    03/19/2016 - 04/25/2016 Radiation Therapy   Adjuvant irradiation, 50  gray in 28 fractions   03/19/2016 - 04/22/2016 Chemotherapy   Xeloda 1500 mg twice daily, started on 03/19/2016, dose reduced to 1000 mg twice daily from week 3 due to neutropenia, and patient stopped 3 days before last dose radiation due to difficulty swallowing the pill    05/20/2016 -  Adjuvant  Chemotherapy   Patient declined adjuvant chemotherapy   09/16/2016 Imaging   CT CAP w Contrast 1. No evidence of local tumor recurrence at the ileocolic anastomosis. 2. No findings suspicious for metastatic disease in the chest, abdomen or pelvis. 3. Nonspecific trace free fluid in the pelvic cul-de-sac. 4. Stable solitary 3 mm right upper lobe pulmonary nodule, for which 6 month stability has been demonstrated, probably benign. 5. Additional findings include stable right posterior pericardial cyst and small calcified uterine fibroids.   05/13/2017 Imaging   CT CAP W Contrast 05/13/17 IMPRESSION: 1. No current findings of residual or recurrent malignancy. 2. Mild prominence of stool throughout the colon. Nondistended portions of the rectum. 3. Several tiny pulmonary nodules are stable from the earliest available comparison of 03/08/2016 and probably benign, but may merit surveillance. 4. Other imaging findings of potential clinical significance: Old granulomatous disease. Aortoiliac atherosclerotic vascular disease. Lumbar spondylosis and degenerative disc disease. Stable amount of trace free pelvic fluid.   04/27/2018 Imaging   04/27/2018 CT CAP IMPRESSION: Stable exam. No evidence of recurrent or metastatic carcinoma within the chest, abdomen, or pelvis   04/19/2019 Imaging   CT CAP W Contrast  IMPRESSION: Chest Impression:   1. No evidence of thoracic metastasis. 2. Stable small bilateral pulmonary nodules.   Abdomen / Pelvis Impression:   1. No evidence local colorectal carcinoma recurrence or metastasis in the abdomen pelvis. 2. Post RIGHT hemicolectomy.   01/22/2021 Imaging   CT CAP  IMPRESSION: CT CHEST IMPRESSION   1. Similar nonspecific pulmonary nodules. 2. New posterior left upper lobe reticulonodular opacity, suspicious for interval mild infection or inflammation. 3. No thoracic adenopathy.   CT ABDOMEN AND PELVIS IMPRESSION   1. Further  decrease in size of high left hepatic lobe 3 mm low-density lesion. No new or progressive metastatic disease within the abdomen or pelvis. 2. Similar trace free pelvic fluid. 3. Similar nonspecific mid rectal wall thickening. 4.  Aortic Atherosclerosis (ICD10-I70.0).   04/23/2021 Imaging   CT CAP  IMPRESSION: 1. Treated metastatic lesion between segments 2 and 3 of the liver, slightly smaller and less distinct than prior examination. No other signs of definite metastatic disease elsewhere in the abdomen or pelvis. 2. Multiple small pulmonary nodules, stable compared to the prior examination, favored to be benign. No definitive findings to suggest metastatic disease to the thorax. 3. Aortic atherosclerosis. 4. Additional incidental findings, as above.   08/13/2021 Imaging   EXAM: CT CHEST, ABDOMEN, AND PELVIS WITH CONTRAST  IMPRESSION: 1. A previously seen PET avid lesion of the anterior left lobe of the liver, hepatic segment II, is no longer discretely appreciable consistent with treatment response of a hepatic metastasis. 2. No evidence of new metastatic disease in the chest, abdomen, or pelvis. 3. Interval increase in a small focus of consolidation and nodularity of the medial left upper lobe, consistent with minimal, ongoing atypical infection. Additional tiny bilateral pulmonary nodules are stable and almost certainly incidental benign. Attention on follow-up. 4. Status post right hemicolectomy and ileocolic anastomosis.   07/02/2022 Imaging    IMPRESSION: CHEST IMPRESSION:   1. No evidence of thoracic metastasis. 2. Stable small pulmonary nodules.  PELVIS IMPRESSION:   1. Stable to slight decrease in size of subcapsular lesion in the RIGHT hepatic lobe. 2. No evidence of new or progressive disease in the abdomen pelvis. 3.  Aortic Atherosclerosis (ICD10-I70.0).     Rectal cancer metastasized to liver ([place])  06/15/2020 Procedure   Screening Colonoscopy by Dr  [name]  IMPRESSION - Decreased sphincter tone and internal hemorrhoids that prolapse with straining, but require manual replacement into the anal canal (Grade III) found on digital rectal exam. - Patent side-to-side ileo-colonic anastomosis, characterized by healthy appearing mucosa. - The examined portion of the ileum was normal. - One diminutive polyp in the proximal transverse colon, removed with a cold biopsy forceps. Resected and retrieved. - Likely malignant partially obstructing tumor in the mid rectum. Biopsied. Tattooed. - The examination was otherwise normal on direct and retroflexion views.   06/15/2020 Initial Biopsy   Diagnosis 1. Transverse Colon Polyp - HYPERPLASTIC POLYP 2. Rectum, biopsy - ADENOCARCINOMA ARISING IN A TUBULAR ADENOMA WITH HIGH-GRADE DYSPLASIA. SEE NOTE Diagnosis Note 2. Dr. [name] reviewed the case and concurs with the diagnosis. Dr. [name] was notified on 06/16/2020.   06/28/2020 Imaging   CT CAP  IMPRESSION: 1. New low-density focus in the anterior aspect of the lateral segment LEFT hepatic lobe measuring 1.2 x 1.0 cm, compatible with small metastatic lesion in the LEFT hepatic lobe. 2. Soft tissue in the RIGHT iliac fossa following RIGHT hemicolectomy invades the psoas musculature and is slowly enlarging over time, more linear on the prior study now highly concerning for recurrence/metastasis to this location. 3. Signs of enteritis, potentially post radiation changes of the small bowel. Tethered small bowel in the RIGHT lower quadrant shows focal thickening and narrowing suspicious for small bowel involvement and developing partial obstruction though currently contrast passes beyond this point into the colon. 4. Rectal thickening in this patient with known rectal mass as described. 5. No evidence of metastatic disease in the chest. 6. Stable small pulmonary nodules. 7.  and aortic atherosclerosis.   Aortic Atherosclerosis (ICD10-I70.0) and  Emphysema (ICD10-J43.9).   07/04/2020 Initial Diagnosis   Rectal cancer metastasized to liver ([place])   07/12/2020 PET scan   IMPRESSION: 1. Exam positive for FDG avid rectal tumor which corresponds to the recent colonoscopy findings. 2. FDG avid soft tissue mass within the right iliac fossa is noted and consistent with local tumor recurrence from previous ascending colon tumor. 3. Lateral segment left lobe of liver lesion is FDG avid concerning for liver metastasis. 4. No specific findings identified to suggest metastatic disease to the chest.   08/10/2020 -  Chemotherapy   First-line FOLFIRI q2weeks starting 08/10/20. dose reduced with cycle 1. Irinotecan/5FU increased and Bevacizumab added with cycle 2 on 08/23/2020    08/17/2020 - 03/09/2022 Chemotherapy   Patient is on Treatment Plan : COLORECTAL FOLFIRI + Bevacizumab q14d     10/27/2020 Imaging   CT CAP  IMPRESSION: 1. Interval decrease in size of the hypermetabolic left hepatic lesion, consistent with metastatic disease. No new liver lesion evident. 2. Interval resolution of the hypermetabolic soft tissue lesion along the right iliac fossa with no measurable soft tissue lesion remaining at this location today. 3. Similar appearance of soft tissue fullness in the rectum at the site of the hypermetabolic lesion seen previously. 4. Stable tiny bilateral pulmonary nodules. Continued attention on follow-up recommended. 5. Small volume free fluid in the pelvis. 6. Aortic Atherosclerosis (ICD10-I70.0).   01/22/2021 Imaging   CT CAP  IMPRESSION: CT CHEST IMPRESSION  1. Similar nonspecific pulmonary nodules. 2. New posterior left upper lobe reticulonodular opacity, suspicious for interval mild infection or inflammation. 3. No thoracic adenopathy.   CT ABDOMEN AND PELVIS IMPRESSION   1. Further decrease in size of high left hepatic lobe 3 mm low-density lesion. No new or progressive metastatic disease within the abdomen or  pelvis. 2. Similar trace free pelvic fluid. 3. Similar nonspecific mid rectal wall thickening. 4.  Aortic Atherosclerosis (ICD10-I70.0).   04/23/2021 Imaging   CT CAP  IMPRESSION: 1. Treated metastatic lesion between segments 2 and 3 of the liver, slightly smaller and less distinct than prior examination. No other signs of definite metastatic disease elsewhere in the abdomen or pelvis. 2. Multiple small pulmonary nodules, stable compared to the prior examination, favored to be benign. No definitive findings to suggest metastatic disease to the thorax. 3. Aortic atherosclerosis. 4. Additional incidental findings, as above.   08/13/2021 Imaging   EXAM: CT CHEST, ABDOMEN, AND PELVIS WITH CONTRAST  IMPRESSION: 1. A previously seen PET avid lesion of the anterior left lobe of the liver, hepatic segment II, is no longer discretely appreciable consistent with treatment response of a hepatic metastasis. 2. No evidence of new metastatic disease in the chest, abdomen, or pelvis. 3. Interval increase in a small focus of consolidation and nodularity of the medial left upper lobe, consistent with minimal, ongoing atypical infection. Additional tiny bilateral pulmonary nodules are stable and almost certainly incidental benign. Attention on follow-up. 4. Status post right hemicolectomy and ileocolic anastomosis.   03/28/2022 -  Chemotherapy   Patient is on Treatment Plan : COLORECTAL FOLFOX + Bevacizumab q14d     07/02/2022 Imaging    IMPRESSION: CHEST IMPRESSION:   1. No evidence of thoracic metastasis. 2. Stable small pulmonary nodules.   PELVIS IMPRESSION:   1. Stable to slight decrease in size of subcapsular lesion in the RIGHT hepatic lobe. 2. No evidence of new or progressive disease in the abdomen pelvis.       Interval history-: Brittney Spencer is a 72 y.o. female with oncologic history as above seen in the infusion center today with chief complaint of nausea and leg  tightness starting approximately 10 minutes ago. Patient is currently on her 30-minute observation period after iron infusion. She is here today for second iron infusion. Patient reports feeling nauseous and mild tightness in her feet.  She denies any numbness or tingling.  Patient was given IV Zofran. Patient tells me that her symptoms are currently mild.  She was given additional premedications today including 25 of Benadryl and Pepcid today as she had severe nausea and vomiting with her first infusion.  On the day of her first iron infusion she also received chemotherapy.  She denies any difficulty breathing, wheezing, chest pain.  ROS  All other systems are reviewed and are negative for acute change except as noted in the HPI.    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 Some residual intraretinal fluid remains, very small localized subfoveal of fluid remains although this continues to slowly resorb. We'll continue to observe.   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   Resolved March 2021 post vitrectomy     Past Surgical History:  Procedure Laterality Date   COLONOSCOPY  2018    [facility]   COLONSCOPY  12/2015   IR IMAGING GUIDED PORT INSERTION  08/04/2020   LAPAROSCOPIC RIGHT HEMI COLECTOMY Right 02/07/2016   Procedure: LAPAROSCOPIC ASSISTED RIGHT HEMI COLECTOMY AND RIGHT SALPINGO OOPHERECTOMY;  Surgeon: [name], MD;  Location: [facility];  Service: General;  Laterality: Right;   RETINAL DETACHMENT SURGERY  09/2019    Social History   Socioeconomic History   Marital status: Widowed    Spouse name: Not on file   Number of children: Not on file   Years of education: Not on file   Highest education level: Not on file  Occupational History   Not on file  Tobacco Use   Smoking status: Never   Smokeless tobacco: Never  Vaping Use   Vaping Use: Never used  Substance and Sexual Activity   Alcohol use: No    Alcohol/week: 0.0 standard drinks of alcohol   Drug use: No   Sexual activity: Yes    Birth control/protection: None  Other Topics Concern   Not on file  Social History Narrative   Not on file   Social Determinants of Health   Financial Resource Strain: Not on file  Food Insecurity: Not on file  Transportation Needs: Not on file  Physical Activity: Not on file  Stress: Not on file  Social Connections: Not on file  Intimate Partner Violence: Not on file    Family History  Problem Relation Age of Onset   Cancer Mother        lung cancer   Cancer Maternal Grandfather        unknown cancer    Colon cancer Neg Hx    Esophageal cancer Neg Hx    Rectal cancer Neg Hx    Stomach cancer Neg Hx    Colon polyps Neg Hx      Current Outpatient Medications:    ALPRAZolam (XANAX) 0.25 MG tablet, Take 1 tablet (0.25 mg total) by mouth daily as needed for anxiety., Disp: 30 tablet, Rfl: 0   Cholecalciferol (VITAMIN D3 PO), Take by mouth daily., Disp: , Rfl:    docusate sodium (COLACE) 100 MG capsule, 1 capsule as needed, Disp: , Rfl:    lidocaine-prilocaine (EMLA) cream, Apply 1 application topically as needed., Disp: 30 g, Rfl: 1   NORVASC 2.5  MG tablet, Take 1 tablet (2.5 mg total) by mouth daily., Disp: 90 tablet, Rfl: 1   ondansetron (ZOFRAN) 8 MG tablet, Take 1 tablet (8 mg total) by mouth every 8 (eight) hours as needed for nausea or vomiting., Disp: 20 tablet, Rfl: 2   prochlorperazine (COMPAZINE) 10 MG tablet, Take 1 tablet (10 mg total) by mouth every 6 (six) hours as needed for nausea or vomiting., Disp: 30 tablet, Rfl: 2  Current Facility-Administered Medications:  0.9 %  sodium chloride infusion, 500 mL, Intravenous, Once, Danis, Estill Cotta III, MD  Facility-Administered Medications Ordered in Other Visits:    ondansetron (ZOFRAN) 4 MG/2ML injection, , , ,   PHYSICAL EXAM: ECOG FS:1 - Symptomatic but completely ambulatory    Vitals:   08/22/22 1120  BP: 136/82  Pulse: 88  Resp: 15  Temp: 98.7 F (37.1 C)  TempSrc: Oral  SpO2: 100%   Physical Exam Vitals and nursing note reviewed.  Constitutional:      Appearance: She is well-developed. She is not ill-appearing or toxic-appearing.     Comments: Airway patent  HENT:     Head: Normocephalic.     Nose: Nose normal.     Mouth/Throat:     Mouth: Mucous membranes are moist.     Pharynx: Oropharynx is clear. Uvula midline. No uvula swelling.  Eyes:     Conjunctiva/sclera: Conjunctivae normal.  Neck:     Vascular: No JVD.  Cardiovascular:     Rate and Rhythm: Normal rate and regular rhythm.     Pulses: Normal pulses.     Heart sounds: Normal heart sounds.  Pulmonary:     Effort: Pulmonary effort is normal.     Breath sounds: Normal breath sounds. No stridor. No wheezing.  Abdominal:     General: Bowel sounds are normal. There is no distension.     Palpations: Abdomen is soft.     Tenderness: There is no abdominal tenderness. There is no right CVA tenderness, left CVA tenderness, guarding or rebound.     Hernia: No hernia is present.  Musculoskeletal:        General: Normal range of motion.     Cervical back: Normal range of motion.     Right lower  leg: No edema.     Left lower leg: No edema.     Comments: Compartments soft in bilateral lower extremities.  No calf tenderness.  DP pulses 2+ bilaterally.  Skin:    General: Skin is warm and dry.     Comments: Equal tactile temperature in all extremities  Neurological:     Mental Status: She is oriented to person, place, and time.        LABORATORY DATA: I have reviewed the data as listed    Latest Ref Rng & Units 08/15/2022    8:05 AM 08/01/2022    7:34 AM 07/18/2022    7:12 AM  CBC  WBC 4.0 - 10.5 K/uL 5.4  5.1  5.2   Hemoglobin 12.0 - 15.0 g/dL 9.4  8.4  8.7   Hematocrit 36.0 - 46.0 % 30.0  27.4  28.3   Platelets 150 - 400 K/uL 114  111  128         Latest Ref Rng & Units 08/15/2022    8:05 AM 08/01/2022    7:34 AM 07/18/2022    7:12 AM  CMP  Glucose 70 - 99 mg/dL 81  80  81   BUN 8 - 23 mg/dL 12  12  9   $ Creatinine 0.44 - 1.00 mg/dL 0.77  0.62  0.68   Sodium 135 - 145 mmol/L 137  139  138   Potassium 3.5 - 5.1 mmol/L 4.0  4.0  4.2   Chloride 98 - 111 mmol/L 104  108  108   CO2 22 - 32 mmol/L 24  25  27   $ Calcium 8.9 - 10.3 mg/dL 8.6  8.5  8.7   Total Protein 6.5 -  8.1 g/dL 6.8  6.4  6.3   Total Bilirubin 0.3 - 1.2 mg/dL 0.5  0.2  0.3   Alkaline Phos 38 - 126 U/L 81  86  89   AST 15 - 41 U/L 22  19  20   $ ALT 0 - 44 U/L 11  8  9        $ RADIOGRAPHIC STUDIES (from last 24 hours if applicable) I have personally reviewed the radiological images as listed and agreed with the findings in the report. No results found.      Visit Diagnosis: 1. Iron deficiency anemia due to chronic blood loss   2. Rectal cancer metastasized to liver ([place])      No orders of the defined types were placed in this encounter.   All questions were answered. The patient knows to call the clinic with any problems, questions or concerns. No barriers to learning was detected.  I have spent a total of 20 minutes minutes of face-to-face and non-face-to-face time, preparing to see the  patient, obtaining and/or reviewing separately obtained history, performing a medically appropriate examination, counseling and educating the patient, ordering medications, documenting clinical information in the electronic health record, and care coordination (communications with other health care professionals or caregivers).    Thank you for allowing me to participate in the care of this patient.    Barrie Folk, PA-C Department of Hematology/Oncology [facility] Phone: [phone]  [phone]    08/22/2022 1:04 PM

## 2022-08-22 NOTE — Patient Instructions (Signed)

## 2022-08-27 NOTE — Progress Notes (Unsigned)
[place]   [phone] Fax:([phone]) [phone]   Clinic Follow up Note   Patient Care Team: Patient, No Pcp Per as PCP - General (General Practice) [name], NP as Nurse Practitioner (Oncology) [name], MD as Consulting Physician (Oncology) [name], RN (Inactive) as Oncology Nurse Navigator  Date of Service:  08/29/2022  CHIEF COMPLAINT: f/u of metastatic rectal cancer, h/o colon cancer         CURRENT THERAPY:    FOLFOX with bevacizumab, q14d, starting 03/28/22     ASSESSMENT:  Brittney Spencer is a 72 y.o. female with   Rectal cancer metastasized to liver ([place]) -Stage IV with liver and right pelvic metastasis, recurrence from previous colon cancer vs new primary   -Diagnosed by screening colonoscopy 05/2020.  -She began first-line chemo with dose-reduced FOLFIRI and Beva in 2/22.  -we changed to FOLFOX with continued Beva on 03/28/22. She tolerated well overall with some fatigue and cold sensitivity. -restaging CT on 07/02/2022 showed overall SD with slight improvement, no new lesions -She is tolerating chemotherapy overall well, will continue.    Cancer of ascending colon ([place]) diagnosed in 12/2015. S/p right hemicolectomy with right salpingo oophorectomy and adjuvant ChemoRT. 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  Iron deficiency anemia due to chronic blood loss will monitor iron level  -worse lately, she received iv venofer '600mg'$  in past month, with minimum response  -will monitor iron level   Thrombocytopenia  -Secondary to chemotherapy, slightly worse lately -Monitor closely  DNR (do not resuscitate) -pt has a living will and agrees with DNR, this is documented in her chart      PLAN: -lab reviewed -Tumor marker  -stable -proceed with C11 folfox +beva, no IV Iron today -repeat CT scan in April -f/u in 2 weeks before next cycle chemo    SUMMARY OF ONCOLOGIC HISTORY: Oncology History Overview Note  Cancer Staging Cancer of ascending colon ([place]) Staging form: Colon and Rectum, AJCC 7th Edition - Clinical stage from 02/07/2016: Stage IIIC (T4b, N1b, M0) - Signed by [name], MD on 03/04/2016 Laterality: Right Residual tumor (R): R2 - Macroscopic    Cancer of ascending colon ([place])  10/25/2015 Imaging   CT ABD/PELVIS:  Inflammatory changes inferior to the cecal tip appear improved, there is still irregular soft tissue thickening of the cecal tip, and there are adjacent prominent lymph nodes in the ileocolonic mesentery, measuring 13 mm on image 49 and 8 mm on image 52. In addition, there is a 2.5 x 1.8 cm nodule on image 46 which has central low density. Therefore, these findings are moderately suspicious for an underlying cecal malignancy with perforation.    01/19/2016 Procedure   COLONOSCOPY per Dr. [name]: Fungating, ulcerated mass almost obstructing mid ascending colon   01/19/2016 Initial Biopsy   Diagnosis Surgical [P], cecal mass - INVASIVE ADENOCARCINOMA WITH ULCERATION. - SEE COMMENT.   02/05/2016 Tumor Marker   Patient's tumor was tested for the following markers: CEA Results of the tumor marker test revealed 5.7.   02/07/2016 Initial Diagnosis   Cancer of ascending colon ([place])   02/07/2016 Definitive Surgery   Laparoscopic assisted right hemicolectomy and right salpingo oopherectomy--Dr. [name]   02/07/2016 Pathologic Stage   p T4 N1b   2/43 nodes +   02/07/2016  Pathology Results   MMR normal; G2 adenocarcinoma;proximal & distal margins negative; soft tissue mass on pelvic sidewall + for adenocarcinoma with positive margin MSI Stable   03/08/2016 Imaging   CT chest negative for metastasis.    03/19/2016 - 04/25/2016 Radiation Therapy   Adjuvant irradiation, 50 gray in 28  fractions   03/19/2016 - 04/22/2016 Chemotherapy   Xeloda 1500 mg twice daily, started on 03/19/2016, dose reduced to 1000 mg twice daily from week 3 due to neutropenia, and patient stopped 3 days before last dose radiation due to difficulty swallowing the pill    05/20/2016 -  Adjuvant Chemotherapy   Patient declined adjuvant chemotherapy   09/16/2016 Imaging   CT CAP w Contrast 1. No evidence of local tumor recurrence at the ileocolic anastomosis. 2. No findings suspicious for metastatic disease in the chest, abdomen or pelvis. 3. Nonspecific trace free fluid in the pelvic cul-de-sac. 4. Stable solitary 3 mm right upper lobe pulmonary nodule, for which 6 month stability has been demonstrated, probably benign. 5. Additional findings include stable right posterior pericardial cyst and small calcified uterine fibroids.   05/13/2017 Imaging   CT CAP W Contrast 05/13/17 IMPRESSION: 1. No current findings of residual or recurrent malignancy. 2. Mild prominence of stool throughout the colon. Nondistended portions of the rectum. 3. Several tiny pulmonary nodules are stable from the earliest available comparison of 03/08/2016 and probably benign, but may merit surveillance. 4. Other imaging findings of potential clinical significance: Old granulomatous disease. Aortoiliac atherosclerotic vascular disease. Lumbar spondylosis and degenerative disc disease. Stable amount of trace free pelvic fluid.   04/27/2018 Imaging   04/27/2018 CT CAP IMPRESSION: Stable exam. No evidence of recurrent or metastatic carcinoma within the chest, abdomen, or pelvis   04/19/2019 Imaging   CT CAP W Contrast  IMPRESSION: Chest Impression:   1. No evidence of thoracic metastasis. 2. Stable small bilateral pulmonary nodules.   Abdomen / Pelvis Impression:   1. No evidence local colorectal carcinoma recurrence or metastasis in the abdomen pelvis. 2. Post RIGHT hemicolectomy.   01/22/2021 Imaging    CT CAP  IMPRESSION: CT CHEST IMPRESSION   1. Similar nonspecific pulmonary nodules. 2. New posterior left upper lobe reticulonodular opacity, suspicious for interval mild infection or inflammation. 3. No thoracic adenopathy.   CT ABDOMEN AND PELVIS IMPRESSION   1. Further decrease in size of high left hepatic lobe 3 mm low-density lesion. No new or progressive metastatic disease within the abdomen or pelvis. 2. Similar trace free pelvic fluid. 3. Similar nonspecific mid rectal wall thickening. 4.  Aortic Atherosclerosis (ICD10-I70.0).   04/23/2021 Imaging   CT CAP  IMPRESSION: 1. Treated metastatic lesion between segments 2 and 3 of the liver, slightly smaller and less distinct than prior examination. No other signs of definite metastatic disease elsewhere in the abdomen or pelvis. 2. Multiple small pulmonary nodules, stable compared to the prior examination, favored to be benign. No definitive findings to suggest metastatic disease to the thorax. 3. Aortic atherosclerosis. 4. Additional incidental findings, as above.   08/13/2021 Imaging   EXAM: CT CHEST, ABDOMEN, AND PELVIS WITH CONTRAST  IMPRESSION: 1. A previously seen PET avid lesion of the anterior left lobe of the liver, hepatic segment II, is no longer discretely appreciable consistent with treatment response of a hepatic metastasis. 2. No evidence of new metastatic disease in the chest, abdomen, or pelvis. 3. Interval increase in a small focus of consolidation and nodularity of the medial left upper lobe, consistent with minimal,  ongoing atypical infection. Additional tiny bilateral pulmonary nodules are stable and almost certainly incidental benign. Attention on follow-up. 4. Status post right hemicolectomy and ileocolic anastomosis.   07/02/2022 Imaging    IMPRESSION: CHEST IMPRESSION:   1. No evidence of thoracic metastasis. 2. Stable small pulmonary nodules.   PELVIS IMPRESSION:   1. Stable to  slight decrease in size of subcapsular lesion in the RIGHT hepatic lobe. 2. No evidence of new or progressive disease in the abdomen pelvis. 3.  Aortic Atherosclerosis (ICD10-I70.0).     Rectal cancer metastasized to liver ([place])  06/15/2020 Procedure   Screening Colonoscopy by Dr [name]  IMPRESSION - Decreased sphincter tone and internal hemorrhoids that prolapse with straining, but require manual replacement into the anal canal (Grade III) found on digital rectal exam. - Patent side-to-side ileo-colonic anastomosis, characterized by healthy appearing mucosa. - The examined portion of the ileum was normal. - One diminutive polyp in the proximal transverse colon, removed with a cold biopsy forceps. Resected and retrieved. - Likely malignant partially obstructing tumor in the mid rectum. Biopsied. Tattooed. - The examination was otherwise normal on direct and retroflexion views.   06/15/2020 Initial Biopsy   Diagnosis 1. Transverse Colon Polyp - HYPERPLASTIC POLYP 2. Rectum, biopsy - ADENOCARCINOMA ARISING IN A TUBULAR ADENOMA WITH HIGH-GRADE DYSPLASIA. SEE NOTE Diagnosis Note 2. Dr. [name] reviewed the case and concurs with the diagnosis. Dr. [name] was notified on 06/16/2020.   06/28/2020 Imaging   CT CAP  IMPRESSION: 1. New low-density focus in the anterior aspect of the lateral segment LEFT hepatic lobe measuring 1.2 x 1.0 cm, compatible with small metastatic lesion in the LEFT hepatic lobe. 2. Soft tissue in the RIGHT iliac fossa following RIGHT hemicolectomy invades the psoas musculature and is slowly enlarging over time, more linear on the prior study now highly concerning for recurrence/metastasis to this location. 3. Signs of enteritis, potentially post radiation changes of the small bowel. Tethered small bowel in the RIGHT lower quadrant shows focal thickening and narrowing suspicious for small bowel involvement and developing partial obstruction though  currently contrast passes beyond this point into the colon. 4. Rectal thickening in this patient with known rectal mass as described. 5. No evidence of metastatic disease in the chest. 6. Stable small pulmonary nodules. 7.  and aortic atherosclerosis.   Aortic Atherosclerosis (ICD10-I70.0) and Emphysema (ICD10-J43.9).   07/04/2020 Initial Diagnosis   Rectal cancer metastasized to liver ([place])   07/12/2020 PET scan   IMPRESSION: 1. Exam positive for FDG avid rectal tumor which corresponds to the recent colonoscopy findings. 2. FDG avid soft tissue mass within the right iliac fossa is noted and consistent with local tumor recurrence from previous ascending colon tumor. 3. Lateral segment left lobe of liver lesion is FDG avid concerning for liver metastasis. 4. No specific findings identified to suggest metastatic disease to the chest.   08/10/2020 -  Chemotherapy   First-line FOLFIRI q2weeks starting 08/10/20. dose reduced with cycle 1. Irinotecan/5FU increased and Bevacizumab added with cycle 2 on 08/23/2020    08/17/2020 - 03/09/2022 Chemotherapy   Patient is on Treatment Plan : COLORECTAL FOLFIRI + Bevacizumab q14d     10/27/2020 Imaging   CT CAP  IMPRESSION: 1. Interval decrease in size of the hypermetabolic left hepatic lesion, consistent with metastatic disease. No new liver lesion evident. 2. Interval resolution of the hypermetabolic soft tissue lesion along the right iliac fossa with no measurable soft tissue lesion remaining at this location today. 3. Similar appearance of  soft tissue fullness in the rectum at the site of the hypermetabolic lesion seen previously. 4. Stable tiny bilateral pulmonary nodules. Continued attention on follow-up recommended. 5. Small volume free fluid in the pelvis. 6. Aortic Atherosclerosis (ICD10-I70.0).   01/22/2021 Imaging   CT CAP  IMPRESSION: CT CHEST IMPRESSION   1. Similar nonspecific pulmonary nodules. 2. New posterior left upper  lobe reticulonodular opacity, suspicious for interval mild infection or inflammation. 3. No thoracic adenopathy.   CT ABDOMEN AND PELVIS IMPRESSION   1. Further decrease in size of high left hepatic lobe 3 mm low-density lesion. No new or progressive metastatic disease within the abdomen or pelvis. 2. Similar trace free pelvic fluid. 3. Similar nonspecific mid rectal wall thickening. 4.  Aortic Atherosclerosis (ICD10-I70.0).   04/23/2021 Imaging   CT CAP  IMPRESSION: 1. Treated metastatic lesion between segments 2 and 3 of the liver, slightly smaller and less distinct than prior examination. No other signs of definite metastatic disease elsewhere in the abdomen or pelvis. 2. Multiple small pulmonary nodules, stable compared to the prior examination, favored to be benign. No definitive findings to suggest metastatic disease to the thorax. 3. Aortic atherosclerosis. 4. Additional incidental findings, as above.   08/13/2021 Imaging   EXAM: CT CHEST, ABDOMEN, AND PELVIS WITH CONTRAST  IMPRESSION: 1. A previously seen PET avid lesion of the anterior left lobe of the liver, hepatic segment II, is no longer discretely appreciable consistent with treatment response of a hepatic metastasis. 2. No evidence of new metastatic disease in the chest, abdomen, or pelvis. 3. Interval increase in a small focus of consolidation and nodularity of the medial left upper lobe, consistent with minimal, ongoing atypical infection. Additional tiny bilateral pulmonary nodules are stable and almost certainly incidental benign. Attention on follow-up. 4. Status post right hemicolectomy and ileocolic anastomosis.   03/28/2022 -  Chemotherapy   Patient is on Treatment Plan : COLORECTAL FOLFOX + Bevacizumab q14d     07/02/2022 Imaging    IMPRESSION: CHEST IMPRESSION:   1. No evidence of thoracic metastasis. 2. Stable small pulmonary nodules.   PELVIS IMPRESSION:   1. Stable to slight decrease in  size of subcapsular lesion in the RIGHT hepatic lobe. 2. No evidence of new or progressive disease in the abdomen pelvis.      INTERVAL HISTORY:  Brittney Spencer is here for a follow up of  metastatic rectal cancer, h/o colon cancer       She was last seen by me on 08/15/2022 She presents to the clinic alone. Pt states that the last treatment was not bad at all. Pt reports of having constipation since giving IV iron.      All other systems were reviewed with the patient and are negative.  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 Some residual intraretinal fluid remains, very small localized  subfoveal of fluid remains although this continues to slowly resorb. We'll continue to observe.   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   Resolved March 2021 post vitrectomy    SURGICAL HISTORY: Past Surgical History:  Procedure Laterality Date   COLONOSCOPY  2018   [facility]   COLONSCOPY  12/2015   IR IMAGING GUIDED PORT INSERTION  08/04/2020   LAPAROSCOPIC RIGHT HEMI COLECTOMY Right 02/07/2016   Procedure: LAPAROSCOPIC ASSISTED RIGHT HEMI COLECTOMY AND RIGHT SALPINGO OOPHERECTOMY;  Surgeon: [name], MD;  Location: [facility];  Service: General;  Laterality: Right;   RETINAL DETACHMENT SURGERY  09/2019    I have reviewed the social history and family history with the patient and they are unchanged  from previous note.  ALLERGIES:  is allergic to venofer [iron sucrose], fish allergy, peanut-containing drug products, soy allergy, and buspirone.  MEDICATIONS:  Current Outpatient Medications  Medication Sig Dispense Refill   ALPRAZolam (XANAX) 0.25 MG tablet Take 1 tablet (0.25 mg total) by mouth daily as needed for anxiety. 30 tablet 0   Cholecalciferol (VITAMIN D3 PO) Take by mouth daily.     docusate sodium (COLACE) 100 MG capsule 1 capsule as needed     lidocaine-prilocaine (EMLA) cream Apply 1 Application topically as needed. 30 g 1   NORVASC 2.5 MG tablet Take 1 tablet (2.5 mg total) by mouth daily. 90 tablet 1   ondansetron (ZOFRAN) 8 MG tablet Take 1 tablet (8 mg total) by mouth every 8 (eight) hours as needed for nausea or vomiting. 20 tablet 2   prochlorperazine (COMPAZINE) 10 MG tablet Take 1 tablet (10 mg total) by mouth every 6 (six) hours as needed for nausea or vomiting. 30 tablet 2   Current Facility-Administered Medications  Medication Dose Route Frequency Provider Last Rate Last Admin   0.9 %  sodium chloride infusion  500 mL Intravenous Once Doran Stabler, MD       Facility-Administered Medications Ordered in Other Visits  Medication Dose Route Frequency Provider Last Rate Last Admin   bevacizumab-bvzr (ZIRABEV) 250 mg in sodium chloride 0.9 % 100 mL chemo infusion  5 mg/kg (Order-Specific) Intravenous Once [name], MD 660 mL/hr at 08/29/22 1217 250 mg at 08/29/22 1217   dextrose 5 % solution   Intravenous Once [name], MD       dextrose 5 % solution   Intravenous Once [name], MD       fluorouracil (ADRUCIL) 2,500 mg in sodium chloride 0.9 % 100 mL chemo infusion  1,800 mg/m2 (Order-Specific) Intravenous 1 day or 1 dose [name], MD       heparin lock flush 100 unit/mL  500 Units Intracatheter Once PRN [name], MD       leucovorin 600 mg in dextrose 5 % 250 mL infusion  400 mg/m2 (Order-Specific) Intravenous Once [name], MD       oxaliplatin  (ELOXATIN) 100 mg in dextrose 5 % 500 mL chemo infusion  60 mg/m2 (Order-Specific) Intravenous Once [name], MD       sodium chloride flush (NS) 0.9 % injection 10 mL  10 mL Intracatheter PRN [name], MD        PHYSICAL EXAMINATION: ECOG PERFORMANCE STATUS: 1 - Symptomatic but completely ambulatory  Vitals:   08/29/22 0945  BP: 139/77  Pulse: 85  Resp: 18  Temp: 98.3 F (36.8 C)  SpO2: 100%   Wt Readings from Last 3 Encounters:  08/29/22 102 lb  9.6 oz (46.5 kg)  08/15/22 104 lb (47.2 kg)  08/01/22 106 lb 4.8 oz (48.2 kg)     GENERAL:alert, no distress and comfortable SKIN: skin color normal, no rashes or significant lesions EYES: normal, Conjunctiva are pink and non-injected, sclera clear  NEURO: alert & oriented x 3 with fluent speech LUNGS: (-) clear to auscultation and percussion with normal breathing effort HEART:(-)  regular rate & rhythm and no murmurs and no lower extremity edema ABDOMEN:(-) abdomen soft, non-tender and normal bowel sounds   LABORATORY DATA:  I have reviewed the data as listed    Latest Ref Rng & Units 08/29/2022    8:59 AM 08/15/2022    8:05 AM 08/01/2022    7:34 AM  CBC  WBC 4.0 - 10.5 K/uL 5.5  5.4  5.1   Hemoglobin 12.0 - 15.0 g/dL 8.8  9.4  8.4   Hematocrit 36.0 - 46.0 % 28.8  30.0  27.4   Platelets 150 - 400 K/uL 84  114  111         Latest Ref Rng & Units 08/29/2022    8:59 AM 08/15/2022    8:05 AM 08/01/2022    7:34 AM  CMP  Glucose 70 - 99 mg/dL 94  81  80   BUN 8 - 23 mg/dL '11  12  12   '$ Creatinine 0.44 - 1.00 mg/dL 0.54  0.77  0.62   Sodium 135 - 145 mmol/L 137  137  139   Potassium 3.5 - 5.1 mmol/L 3.9  4.0  4.0   Chloride 98 - 111 mmol/L 106  104  108   CO2 22 - 32 mmol/L '26  24  25   '$ Calcium 8.9 - 10.3 mg/dL 8.2  8.6  8.5   Total Protein 6.5 - 8.1 g/dL 6.0  6.8  6.4   Total Bilirubin 0.3 - 1.2 mg/dL 0.2  0.5  0.2   Alkaline Phos 38 - 126 U/L 84  81  86   AST 15 - 41 U/L '18  22  19   '$ ALT 0 - 44 U/L '8  11  8        '$ RADIOGRAPHIC STUDIES: I have personally reviewed the radiological images as listed and agreed with the findings in the report. No results found.    Orders Placed This Encounter  Procedures   CBC with Differential ([facility] Only)    Standing Status:   Future    Standing Expiration Date:   10/18/2023   CMP ([facility] only)    Standing Status:   Future    Standing Expiration Date:   10/18/2023   Total Protein, Urine dipstick    Standing Status:   Future    Standing Expiration Date:   10/18/2023   All questions were answered. The patient knows to call the clinic with any problems, questions or concerns. No barriers to learning was detected. The total time spent in the appointment was 30 minutes.     [name], MD 08/29/2022   Felicity Coyer, CMA, am acting as scribe for [name], MD.   I have reviewed the above documentation for accuracy and completeness, and I agree with the above.

## 2022-08-28 ENCOUNTER — Inpatient Hospital Stay: Payer: Federal, State, Local not specified - PPO

## 2022-08-28 ENCOUNTER — Inpatient Hospital Stay: Payer: Federal, State, Local not specified - PPO | Admitting: Hematology

## 2022-08-28 MED FILL — Dexamethasone Sodium Phosphate Inj 100 MG/10ML: INTRAMUSCULAR | Qty: 1 | Status: AC

## 2022-08-28 NOTE — Assessment & Plan Note (Signed)
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  -She began first-line chemo with dose-reduced FOLFIRI and Beva in 2/22.  -we changed to FOLFOX with continued Beva on 03/28/22. She tolerated well overall with some fatigue and cold sensitivity. -restaging CT on 07/02/2022 showed overall SD with slight improvement, no new lesions -She is tolerating chemotherapy overall well, will continue.

## 2022-08-28 NOTE — Assessment & Plan Note (Signed)
diagnosed in 12/2015. S/p right hemicolectomy with right salpingo oophorectomy and adjuvant ChemoRT. 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

## 2022-08-28 NOTE — Assessment & Plan Note (Signed)
will monitor iron level  -no previous iv iron  -worse lately -will monitor iron level

## 2022-08-28 NOTE — Assessment & Plan Note (Signed)
-  pt has a living will and agrees with DNR, this is documented in her chart

## 2022-08-29 ENCOUNTER — Other Ambulatory Visit: Payer: Self-pay

## 2022-08-29 ENCOUNTER — Encounter: Payer: Self-pay | Admitting: Hematology

## 2022-08-29 ENCOUNTER — Inpatient Hospital Stay (HOSPITAL_BASED_OUTPATIENT_CLINIC_OR_DEPARTMENT_OTHER): Payer: Federal, State, Local not specified - PPO | Admitting: Hematology

## 2022-08-29 ENCOUNTER — Inpatient Hospital Stay: Payer: Federal, State, Local not specified - PPO

## 2022-08-29 VITALS — BP 139/77 | HR 85 | Temp 98.3°F | Resp 18 | Ht 66.0 in | Wt 102.6 lb

## 2022-08-29 VITALS — BP 139/86 | HR 79 | Resp 17

## 2022-08-29 DIAGNOSIS — C20 Malignant neoplasm of rectum: Secondary | ICD-10-CM

## 2022-08-29 DIAGNOSIS — Z95828 Presence of other vascular implants and grafts: Secondary | ICD-10-CM

## 2022-08-29 DIAGNOSIS — Z5112 Encounter for antineoplastic immunotherapy: Secondary | ICD-10-CM | POA: Diagnosis not present

## 2022-08-29 DIAGNOSIS — C182 Malignant neoplasm of ascending colon: Secondary | ICD-10-CM | POA: Diagnosis not present

## 2022-08-29 DIAGNOSIS — D500 Iron deficiency anemia secondary to blood loss (chronic): Secondary | ICD-10-CM

## 2022-08-29 DIAGNOSIS — Z66 Do not resuscitate: Secondary | ICD-10-CM | POA: Diagnosis not present

## 2022-08-29 DIAGNOSIS — C787 Secondary malignant neoplasm of liver and intrahepatic bile duct: Secondary | ICD-10-CM

## 2022-08-29 LAB — CBC WITH DIFFERENTIAL (CANCER CENTER ONLY)
Abs Immature Granulocytes: 0.04 10*3/uL (ref 0.00–0.07)
Basophils Absolute: 0 10*3/uL (ref 0.0–0.1)
Basophils Relative: 0 %
Eosinophils Absolute: 0 10*3/uL (ref 0.0–0.5)
Eosinophils Relative: 1 %
HCT: 28.8 % — ABNORMAL LOW (ref 36.0–46.0)
Hemoglobin: 8.8 g/dL — ABNORMAL LOW (ref 12.0–15.0)
Immature Granulocytes: 1 %
Lymphocytes Relative: 14 %
Lymphs Abs: 0.8 10*3/uL (ref 0.7–4.0)
MCH: 26 pg (ref 26.0–34.0)
MCHC: 30.6 g/dL (ref 30.0–36.0)
MCV: 85.2 fL (ref 80.0–100.0)
Monocytes Absolute: 0.7 10*3/uL (ref 0.1–1.0)
Monocytes Relative: 13 %
Neutro Abs: 4 10*3/uL (ref 1.7–7.7)
Neutrophils Relative %: 71 %
Platelet Count: 84 10*3/uL — ABNORMAL LOW (ref 150–400)
RBC: 3.38 MIL/uL — ABNORMAL LOW (ref 3.87–5.11)
RDW: 20.6 % — ABNORMAL HIGH (ref 11.5–15.5)
WBC Count: 5.5 10*3/uL (ref 4.0–10.5)
nRBC: 0 % (ref 0.0–0.2)

## 2022-08-29 LAB — CMP (CANCER CENTER ONLY)
ALT: 8 U/L (ref 0–44)
AST: 18 U/L (ref 15–41)
Albumin: 3.4 g/dL — ABNORMAL LOW (ref 3.5–5.0)
Alkaline Phosphatase: 84 U/L (ref 38–126)
Anion gap: 5 (ref 5–15)
BUN: 11 mg/dL (ref 8–23)
CO2: 26 mmol/L (ref 22–32)
Calcium: 8.2 mg/dL — ABNORMAL LOW (ref 8.9–10.3)
Chloride: 106 mmol/L (ref 98–111)
Creatinine: 0.54 mg/dL (ref 0.44–1.00)
GFR, Estimated: >60 mL/min (ref >60)
Glucose, Bld: 94 mg/dL (ref 70–99)
Potassium: 3.9 mmol/L (ref 3.5–5.1)
Sodium: 137 mmol/L (ref 135–145)
Total Bilirubin: 0.2 mg/dL — ABNORMAL LOW (ref 0.3–1.2)
Total Protein: 6 g/dL — ABNORMAL LOW (ref 6.5–8.1)

## 2022-08-29 LAB — TOTAL PROTEIN, URINE DIPSTICK: Protein, ur: NEGATIVE mg/dL

## 2022-08-29 MED ORDER — SODIUM CHLORIDE 0.9 % IV SOLN
10.0000 mg | Freq: Once | INTRAVENOUS | Status: AC
  Administered 2022-08-29: 10 mg via INTRAVENOUS
  Filled 2022-08-29: qty 10

## 2022-08-29 MED ORDER — PALONOSETRON HCL INJECTION 0.25 MG/5ML
0.2500 mg | Freq: Once | INTRAVENOUS | Status: AC
  Administered 2022-08-29: 0.25 mg via INTRAVENOUS
  Filled 2022-08-29: qty 5

## 2022-08-29 MED ORDER — DEXTROSE 5 % IV SOLN: Freq: Once | INTRAVENOUS | Status: DC

## 2022-08-29 MED ORDER — SODIUM CHLORIDE 0.9% FLUSH: 10.0000 mL | INTRAVENOUS | Status: DC | PRN

## 2022-08-29 MED ORDER — SODIUM CHLORIDE 0.9 % IV SOLN
1800.0000 mg/m2 | INTRAVENOUS | Status: DC
  Administered 2022-08-29: 2500 mg via INTRAVENOUS
  Filled 2022-08-29: qty 50

## 2022-08-29 MED ORDER — LEUCOVORIN CALCIUM INJECTION 350 MG
400.0000 mg/m2 | Freq: Once | INTRAVENOUS | Status: AC
  Administered 2022-08-29: 600 mg via INTRAVENOUS
  Filled 2022-08-29: qty 30

## 2022-08-29 MED ORDER — HEPARIN SOD (PORK) LOCK FLUSH 100 UNIT/ML IV SOLN: 500.0000 [IU] | Freq: Once | INTRAVENOUS | Status: DC | PRN

## 2022-08-29 MED ORDER — OXALIPLATIN CHEMO INJECTION 100 MG/20ML
60.0000 mg/m2 | Freq: Once | INTRAVENOUS | Status: AC
  Administered 2022-08-29: 100 mg via INTRAVENOUS
  Filled 2022-08-29: qty 20

## 2022-08-29 MED ORDER — DEXTROSE 5 % IV SOLN: Freq: Once | INTRAVENOUS | Status: AC

## 2022-08-29 MED ORDER — SODIUM CHLORIDE 0.9 % IV SOLN: Freq: Once | INTRAVENOUS | Status: AC

## 2022-08-29 MED ORDER — SODIUM CHLORIDE 0.9 % IV SOLN
5.0000 mg/kg | Freq: Once | INTRAVENOUS | Status: AC
  Administered 2022-08-29: 250 mg via INTRAVENOUS
  Filled 2022-08-29: qty 10

## 2022-08-29 MED ORDER — SODIUM CHLORIDE 0.9% FLUSH
10.0000 mL | Freq: Once | INTRAVENOUS | Status: AC
  Administered 2022-08-29: 10 mL

## 2022-08-29 MED ORDER — LIDOCAINE-PRILOCAINE 2.5-2.5 % EX CREA: 1.0000 | TOPICAL_CREAM | CUTANEOUS | 1 refills | Status: DC | PRN

## 2022-08-29 NOTE — Progress Notes (Signed)
VO ok to treat with PLTC per Dr. Burr Medico

## 2022-08-29 NOTE — Patient Instructions (Signed)
Brittney Spencer  Discharge Instructions: Thank you for choosing [facility] to provide your oncology and hematology care.   If you have a lab appointment with the [facility], please go directly to the [facility] and check in at the registration area.   Wear comfortable clothing and clothing appropriate for easy access to any Portacath or PICC line.   We strive to give you quality time with your provider. You may need to reschedule your appointment if you arrive late (15 or more minutes).  Arriving late affects you and other patients whose appointments are after yours.  Also, if you miss three or more appointments without notifying the office, you may be dismissed from the clinic at the provider's discretion.      For prescription refill requests, have your pharmacy contact our office and allow 72 hours for refills to be completed.    Today you received the following chemotherapy and/or immunotherapy agents: bevacizumab, oxaliplatin, adrucil       To help prevent nausea and vomiting after your treatment, we encourage you to take your nausea medication as directed.  BELOW ARE SYMPTOMS THAT SHOULD BE REPORTED IMMEDIATELY: *FEVER GREATER THAN 100.4 F (38 C) OR HIGHER *CHILLS OR SWEATING *NAUSEA AND VOMITING THAT IS NOT CONTROLLED WITH YOUR NAUSEA MEDICATION *UNUSUAL SHORTNESS OF BREATH *UNUSUAL BRUISING OR BLEEDING *URINARY PROBLEMS (pain or burning when urinating, or frequent urination) *BOWEL PROBLEMS (unusual diarrhea, constipation, pain near the anus) TENDERNESS IN MOUTH AND THROAT WITH OR WITHOUT PRESENCE OF ULCERS (sore throat, sores in mouth, or a toothache) UNUSUAL RASH, SWELLING OR PAIN  UNUSUAL VAGINAL DISCHARGE OR ITCHING   Items with * indicate a potential emergency and should be followed up as soon as possible or go to the Emergency Department if any problems should occur.  Please show the CHEMOTHERAPY ALERT CARD or  IMMUNOTHERAPY ALERT CARD at check-in to the Emergency Department and triage nurse.  Should you have questions after your visit or need to cancel or reschedule your appointment, please contact [facility]  Dept: [phone]  and follow the prompts.  Office hours are 8:00 a.m. to 4:30 p.m. Monday - Friday. Please note that voicemails left after 4:00 p.m. may not be returned until the following business day.  We are closed weekends and major holidays. You have access to a nurse at all times for urgent questions. Please call the main number to the clinic Dept: [phone] and follow the prompts.   For any non-urgent questions, you may also contact your provider using MyChart. We now offer e-Visits for anyone 1 and older to request care online for non-urgent symptoms. For details visit [email or web address].   Also download the MyChart app! Go to the app store, search "MyChart", open the app, select [facility], and log in with your MyChart username and password.

## 2022-08-29 NOTE — Progress Notes (Signed)
Oxaliplatin dose of '100mg'$  just outside 10% with rounding.  Ok to keep at '100mg'$ .

## 2022-08-30 ENCOUNTER — Other Ambulatory Visit: Payer: Self-pay

## 2022-08-30 MED FILL — Iron Sucrose Inj 20 MG/ML (Fe Equiv): INTRAVENOUS | Qty: 15 | Status: AC

## 2022-08-31 ENCOUNTER — Inpatient Hospital Stay: Payer: Federal, State, Local not specified - PPO | Attending: Nurse Practitioner

## 2022-08-31 VITALS — BP 125/75 | HR 93 | Temp 99.5°F | Resp 17

## 2022-08-31 DIAGNOSIS — I10 Essential (primary) hypertension: Secondary | ICD-10-CM | POA: Insufficient documentation

## 2022-08-31 DIAGNOSIS — C787 Secondary malignant neoplasm of liver and intrahepatic bile duct: Secondary | ICD-10-CM | POA: Diagnosis not present

## 2022-08-31 DIAGNOSIS — Z66 Do not resuscitate: Secondary | ICD-10-CM | POA: Insufficient documentation

## 2022-08-31 DIAGNOSIS — Z9221 Personal history of antineoplastic chemotherapy: Secondary | ICD-10-CM | POA: Insufficient documentation

## 2022-08-31 DIAGNOSIS — Z923 Personal history of irradiation: Secondary | ICD-10-CM | POA: Insufficient documentation

## 2022-08-31 DIAGNOSIS — Z5189 Encounter for other specified aftercare: Secondary | ICD-10-CM | POA: Insufficient documentation

## 2022-08-31 DIAGNOSIS — F419 Anxiety disorder, unspecified: Secondary | ICD-10-CM | POA: Insufficient documentation

## 2022-08-31 DIAGNOSIS — Z1509 Genetic susceptibility to other malignant neoplasm: Secondary | ICD-10-CM | POA: Diagnosis not present

## 2022-08-31 DIAGNOSIS — Z79899 Other long term (current) drug therapy: Secondary | ICD-10-CM | POA: Insufficient documentation

## 2022-08-31 DIAGNOSIS — Z5112 Encounter for antineoplastic immunotherapy: Secondary | ICD-10-CM | POA: Insufficient documentation

## 2022-08-31 DIAGNOSIS — C20 Malignant neoplasm of rectum: Secondary | ICD-10-CM

## 2022-08-31 DIAGNOSIS — Z5111 Encounter for antineoplastic chemotherapy: Secondary | ICD-10-CM | POA: Insufficient documentation

## 2022-08-31 DIAGNOSIS — C182 Malignant neoplasm of ascending colon: Secondary | ICD-10-CM | POA: Insufficient documentation

## 2022-08-31 DIAGNOSIS — T451X5D Adverse effect of antineoplastic and immunosuppressive drugs, subsequent encounter: Secondary | ICD-10-CM | POA: Insufficient documentation

## 2022-08-31 DIAGNOSIS — D6481 Anemia due to antineoplastic chemotherapy: Secondary | ICD-10-CM | POA: Diagnosis not present

## 2022-08-31 MED ORDER — SODIUM CHLORIDE 0.9% FLUSH
10.0000 mL | INTRAVENOUS | Status: DC | PRN
  Administered 2022-08-31: 10 mL

## 2022-08-31 MED ORDER — HEPARIN SOD (PORK) LOCK FLUSH 100 UNIT/ML IV SOLN
500.0000 [IU] | Freq: Once | INTRAVENOUS | Status: AC | PRN
  Administered 2022-08-31: 500 [IU]

## 2022-08-31 MED ORDER — PEGFILGRASTIM-JMDB 6 MG/0.6ML ~~LOC~~ SOSY
6.0000 mg | PREFILLED_SYRINGE | Freq: Once | SUBCUTANEOUS | Status: AC
  Administered 2022-08-31: 6 mg via SUBCUTANEOUS

## 2022-09-09 IMAGING — CT CT CHEST-ABD-PELV W/ CM
3 of 5 series · 14 of 36 positions shown, 16 images · IV contrast (OMNIPAQUE)
Comparison: 08/13/2021.

CLINICAL DATA: Colon cancer, assess treatment response. * Tracking
Code: BO * chemotherapy in progress.

EXAM:
CT CHEST, ABDOMEN, AND PELVIS WITH CONTRAST
TECHNIQUE: Multidetector CT imaging of the chest, abdomen and pelvis was
performed following the standard protocol during bolus
administration of intravenous contrast.

[Series 2: cap with · axial · 0.66mm/px · z∈[-563,-88]mm · 9 of 119 slices shown, 11 images]
[im 12/119  mediastinal]
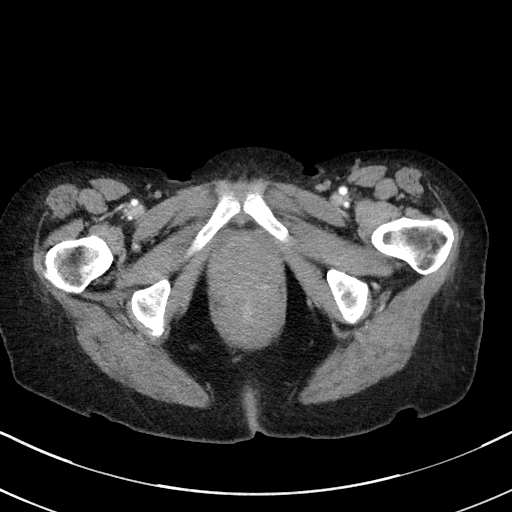
[im 12/119  bone]
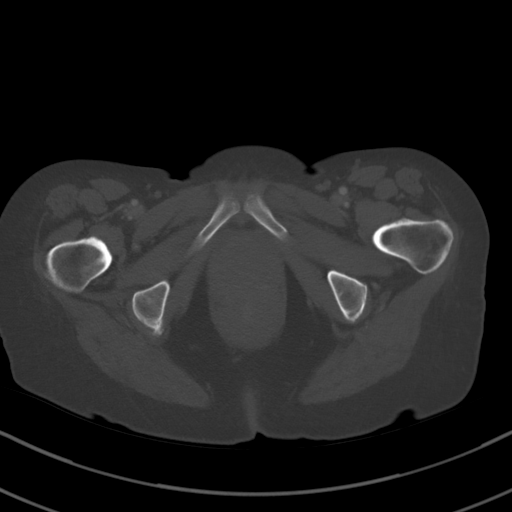
[im 24/119  mediastinal]
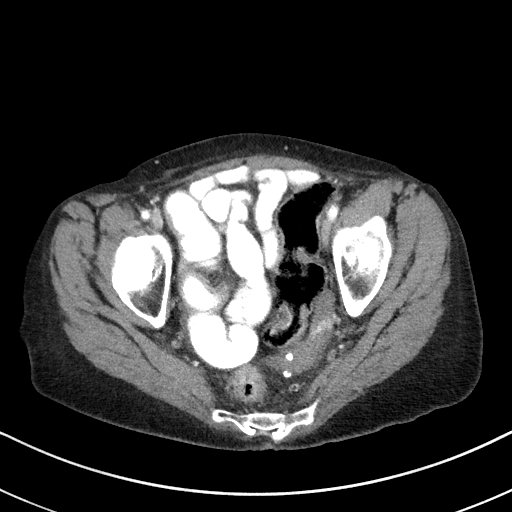
[im 36/119  mediastinal]
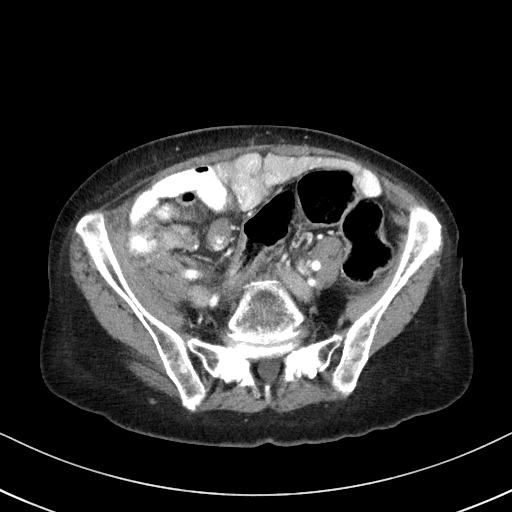
[im 48/119  mediastinal]
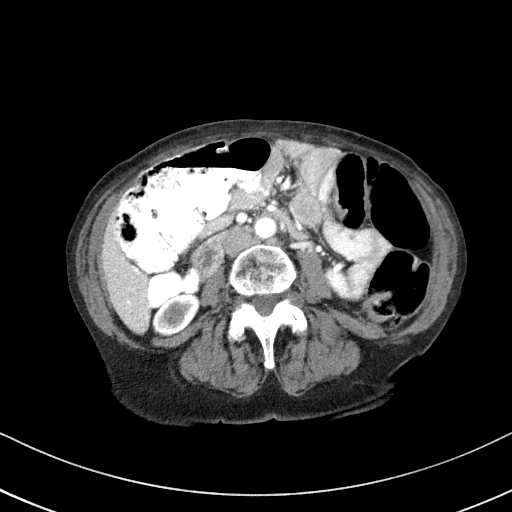
[im 60/119  mediastinal]
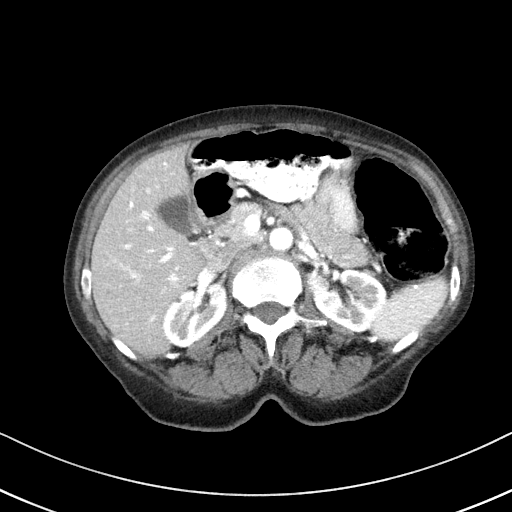
[im 71/119  mediastinal]
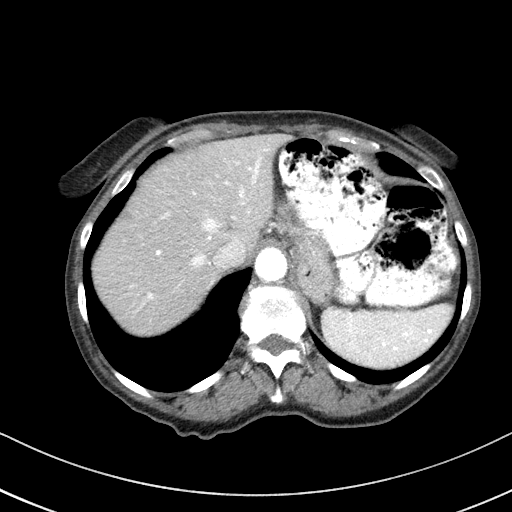
[im 83/119  mediastinal]
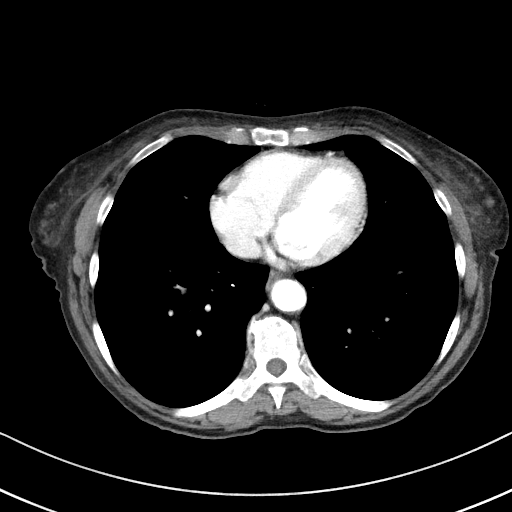
[im 95/119  mediastinal]
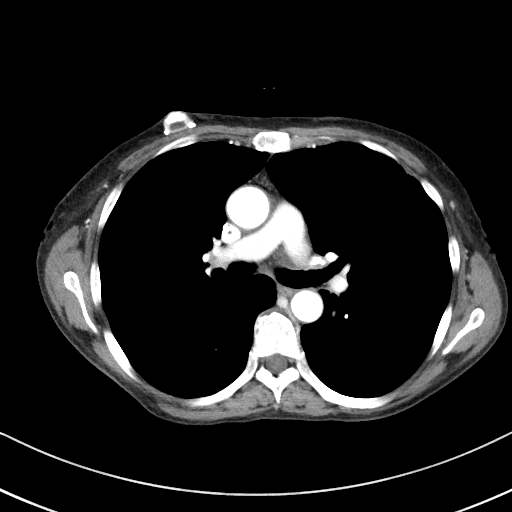
[im 107/119  mediastinal]
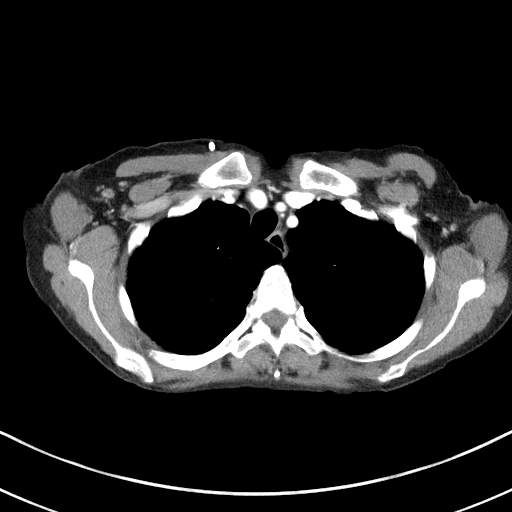
[im 107/119  bone]
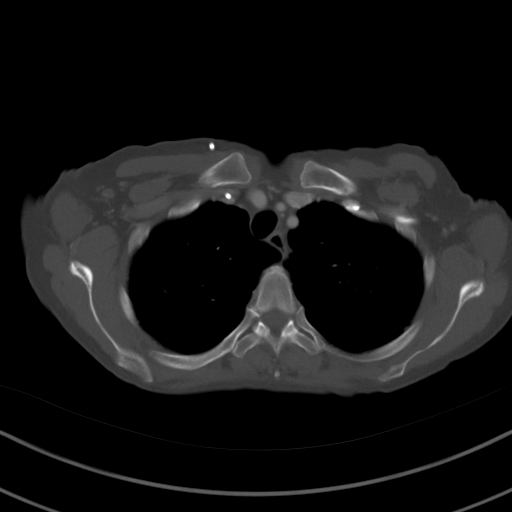

[Series 4: coronals · coronal · 0.67mm/px · 3 of 131 slices shown]
[im 27/131  mediastinal]
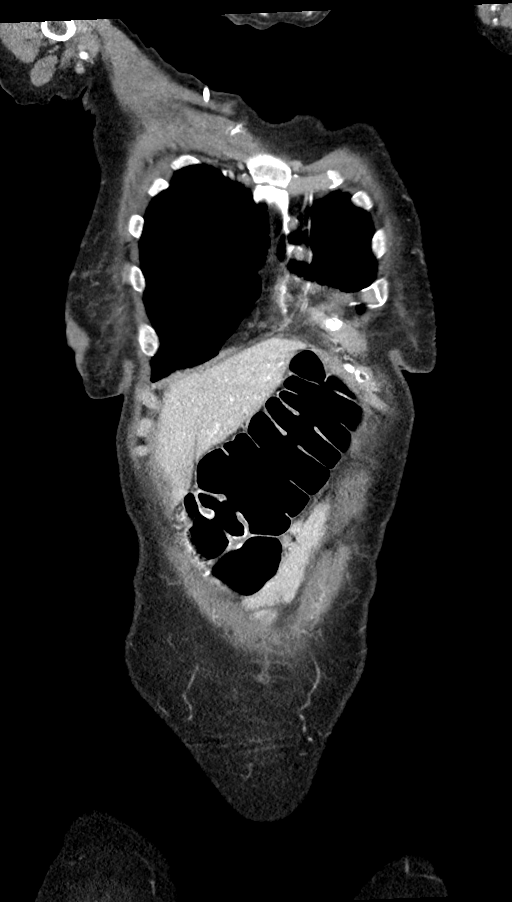
[im 53/131  mediastinal]
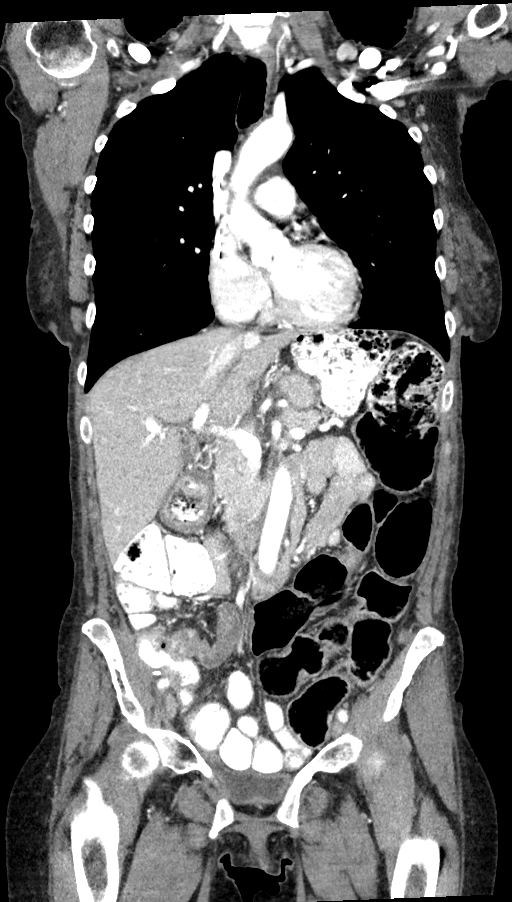
[im 79/131  mediastinal]
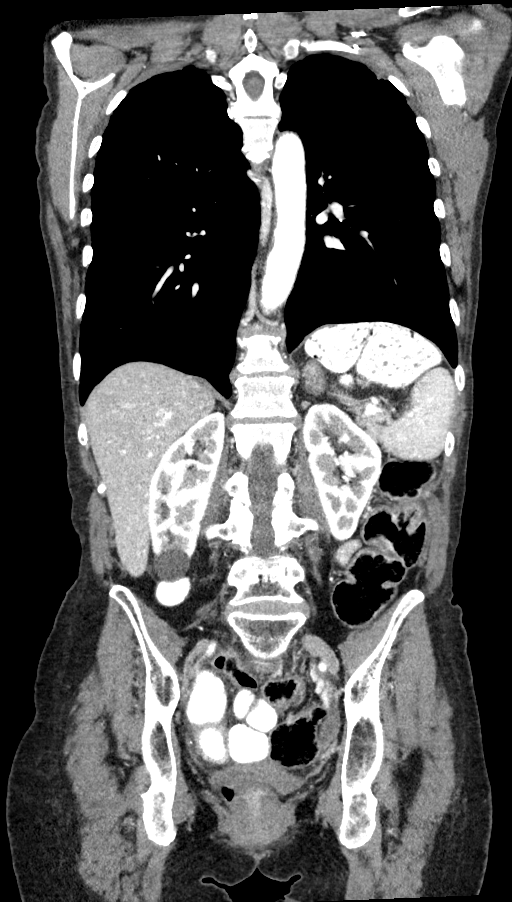

[Series 6: lung · axial · 0.66mm/px · z∈[-300,-256]mm · 2 of 148 slices shown]
[im 12/148  bone]
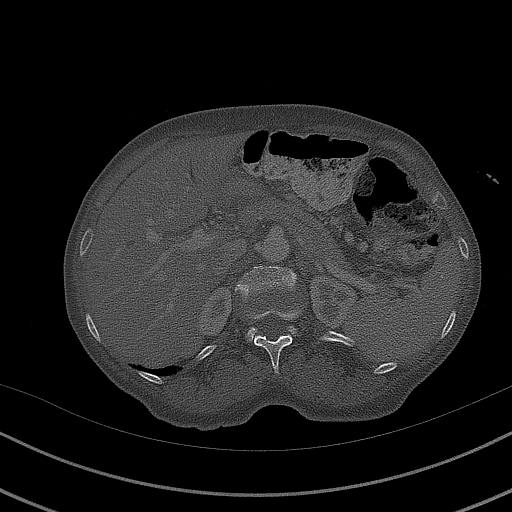
[im 34/148  bone]
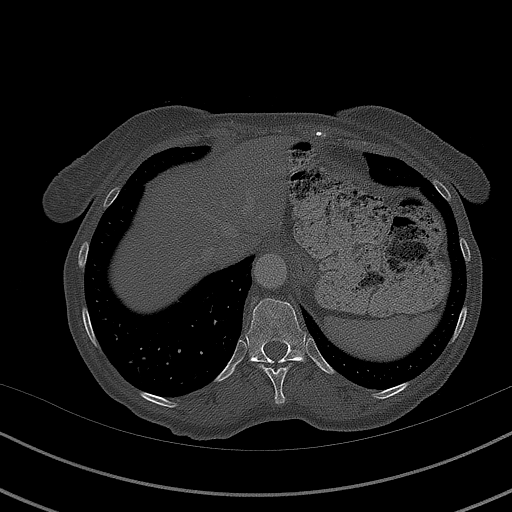

[14 of 36 positions shown; findings below may reference images not displayed]

RADIATION DOSE REDUCTION: This exam was performed according to the
departmental dose-optimization program which includes automated
exposure control, adjustment of the mA and/or kV according to
patient size and/or use of iterative reconstruction technique.

CONTRAST:  100mL OMNIPAQUE IOHEXOL 300 MG/ML  SOLN
FINDINGS: CT CHEST FINDINGS

Cardiovascular: Right IJ Port-A-Cath terminates in the right atrium.
Heart size normal. No pericardial effusion.

Mediastinum/Nodes: No pathologically enlarged mediastinal, hilar or
axillary lymph nodes. Esophagus is unremarkable. Prominent
pericardial recess along the right inferior pulmonary vein.

Lungs/Pleura: Biapical pleuroparenchymal scarring. Mild central
cylindrical bronchiectasis. A few scattered pulmonary nodules
measure up to 5 mm in the posteromedial right lower lobe (6/77),
unchanged. Calcified granulomas. Scattered peribronchovascular
scarring in the left upper lobe. No pleural fluid. Airway is
unremarkable.

Musculoskeletal: Degenerative changes in the spine. No worrisome
lytic or sclerotic lesions.

CT ABDOMEN PELVIS FINDINGS

Hepatobiliary: Minimally hypoattenuating lesion in the right hepatic
lobe measures 1.2 x 1.5 cm (2/56). Millimetric low-attenuation
lesions in the liver are too small to characterize. Liver and
gallbladder are otherwise unremarkable. No biliary ductal
dilatation.

Pancreas: Negative.

Spleen: Negative.

Adrenals/Urinary Tract: Adrenal glands are unremarkable. Non rotated
right kidney. 2.8 cm low-attenuation lesion off the lower pole right
kidney, likely a cyst. No specific follow-up necessary. Additional
subcentimeter low-attenuation lesions in the kidneys are too small
to characterize. No follow-up necessary. Bladder is low in volume.

Stomach/Bowel: Stomach and small bowel are unremarkable. Right
hemicolectomy. Associated soft tissue thickening in right iliac
fossa is ill-defined and appears grossly stable. Colon is otherwise
unremarkable.

Vascular/Lymphatic: Vascular structures are unremarkable. No
pathologically enlarged lymph nodes.

Reproductive: Uterus is visualized.  No adnexal mass.

Other: Renal pelvic free fluid. Mesenteries and peritoneum are
otherwise unremarkable.

Musculoskeletal: Degenerative changes in the spine. No worrisome
lytic or sclerotic lesions.
IMPRESSION: 1. Right hepatic lobe metastasis.
2. Right hemicolectomy with soft tissue thickening in the right
iliac fossa, unchanged.
3. A few scattered pulmonary nodules measure up to 5 mm, stable.
Recommend continued attention on follow-up.
4. Scattered peribronchovascular scarring in the left upper lobe.

## 2022-09-11 MED FILL — Dexamethasone Sodium Phosphate Inj 100 MG/10ML: INTRAMUSCULAR | Qty: 1 | Status: AC

## 2022-09-11 NOTE — Assessment & Plan Note (Signed)
diagnosed in 12/2015. S/p right hemicolectomy with right salpingo oophorectomy and adjuvant ChemoRT. 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 

## 2022-09-11 NOTE — Progress Notes (Unsigned)
Brittney Spencer   [phone] [phone]   Clinic Follow up Note   Patient Care Team: Patient, No Pcp Per as PCP - General (General Practice) [name], NP as Nurse Practitioner (Oncology) [name], MD as Consulting Physician (Oncology) [name], RN (Inactive) as Oncology Nurse Navigator  Date of Service:  09/12/2022  CHIEF COMPLAINT: f/u of metastatic rectal cancer, h/o colon cancer         CURRENT THERAPY:    FOLFOX with bevacizumab, q14d, starting 03/28/22    ASSESSMENT:  Brittney Spencer is a 72 y.o. 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  -She began first-line chemo with dose-reduced FOLFIRI and Beva in 2/22.  -we changed to FOLFOX with continued Beva on 03/28/22. She tolerated well overall with some fatigue and cold sensitivity. -restaging CT on 07/02/2022 showed overall SD with slight improvement, no new lesions -She is tolerating chemotherapy overall well, will continue.    Cancer of ascending colon ([place]) diagnosed in 12/2015. S/p right hemicolectomy with right salpingo oophorectomy and adjuvant ChemoRT. 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  Iron deficiency anemia due to chronic blood loss will monitor iron level  -worse lately, she received iv venofer '600mg'$  in past month, with minimum response  -will monitor iron level   DNR (do not resuscitate) -pt has a living will and agrees with DNR, this is documented in her chart      PLAN: -lab reviewed -CT scan in 4-5 weeks -proceed with C12 FOLFOX+BEVA today at same dose today  -lab,f/u and Folfox/bev on 10/03/2022  (postpone for a week)    SUMMARY OF ONCOLOGIC HISTORY: Oncology History Overview Note  Cancer Staging Cancer of ascending colon [facility]) Staging form: Colon and Rectum, AJCC 7th Edition - Clinical stage from 02/07/2016: Stage IIIC (T4b, N1b, M0) - Signed by [name], MD on 03/04/2016 Laterality: Right Residual tumor (R): R2 - Macroscopic    Cancer of ascending colon ([place])  10/25/2015 Imaging   CT ABD/PELVIS:  Inflammatory changes inferior to the cecal tip appear improved, there is still irregular soft tissue thickening of the cecal tip, and there are adjacent prominent lymph nodes in the ileocolonic mesentery, measuring 13 mm on image 49 and 8 mm on image 52. In addition, there is a 2.5 x 1.8 cm nodule on image 46 which has central low density. Therefore, these findings are moderately suspicious for an underlying cecal malignancy with perforation.    01/19/2016 Procedure   COLONOSCOPY per Dr. [name]: Fungating, ulcerated mass almost obstructing mid ascending colon   01/19/2016 Initial Biopsy   Diagnosis Surgical [P], cecal mass - INVASIVE ADENOCARCINOMA WITH ULCERATION. - SEE COMMENT.   02/05/2016 Tumor Marker   Patient's tumor was tested for the following markers: CEA Results of the tumor marker test revealed 5.7.   02/07/2016 Initial Diagnosis   Cancer of ascending colon ([place])   02/07/2016 Definitive Surgery   Laparoscopic assisted right hemicolectomy and right salpingo oopherectomy--Dr. [name]   02/07/2016 Pathologic Stage   p T4 N1b   2/43 nodes +   02/07/2016 Pathology Results   MMR normal; G2 adenocarcinoma;proximal & distal margins negative; soft  tissue mass on pelvic sidewall + for adenocarcinoma with positive margin MSI Stable   03/08/2016 Imaging   CT chest negative for metastasis.    03/19/2016 - 04/25/2016 Radiation Therapy   Adjuvant irradiation, 50 gray in 28 fractions   03/19/2016 - 04/22/2016 Chemotherapy   Xeloda 1500 mg twice daily, started on 03/19/2016, dose  reduced to 1000 mg twice daily from week 3 due to neutropenia, and patient stopped 3 days before last dose radiation due to difficulty swallowing the pill    05/20/2016 -  Adjuvant Chemotherapy   Patient declined adjuvant chemotherapy   09/16/2016 Imaging   CT CAP w Contrast 1. No evidence of local tumor recurrence at the ileocolic anastomosis. 2. No findings suspicious for metastatic disease in the chest, abdomen or pelvis. 3. Nonspecific trace free fluid in the pelvic cul-de-sac. 4. Stable solitary 3 mm right upper lobe pulmonary nodule, for which 6 month stability has been demonstrated, probably benign. 5. Additional findings include stable right posterior pericardial cyst and small calcified uterine fibroids.   05/13/2017 Imaging   CT CAP W Contrast 05/13/17 IMPRESSION: 1. No current findings of residual or recurrent malignancy. 2. Mild prominence of stool throughout the colon. Nondistended portions of the rectum. 3. Several tiny pulmonary nodules are stable from the earliest available comparison of 03/08/2016 and probably benign, but may merit surveillance. 4. Other imaging findings of potential clinical significance: Old granulomatous disease. Aortoiliac atherosclerotic vascular disease. Lumbar spondylosis and degenerative disc disease. Stable amount of trace free pelvic fluid.   04/27/2018 Imaging   04/27/2018 CT CAP IMPRESSION: Stable exam. No evidence of recurrent or metastatic carcinoma within the chest, abdomen, or pelvis   04/19/2019 Imaging   CT CAP W Contrast  IMPRESSION: Chest Impression:   1. No evidence of thoracic metastasis. 2. Stable small bilateral pulmonary nodules.   Abdomen / Pelvis Impression:   1. No evidence local colorectal carcinoma recurrence or metastasis in the abdomen pelvis. 2. Post RIGHT hemicolectomy.   01/22/2021 Imaging   CT CAP  IMPRESSION: CT CHEST IMPRESSION   1. Similar nonspecific pulmonary nodules. 2. New posterior  left upper lobe reticulonodular opacity, suspicious for interval mild infection or inflammation. 3. No thoracic adenopathy.   CT ABDOMEN AND PELVIS IMPRESSION   1. Further decrease in size of high left hepatic lobe 3 mm low-density lesion. No new or progressive metastatic disease within the abdomen or pelvis. 2. Similar trace free pelvic fluid. 3. Similar nonspecific mid rectal wall thickening. 4.  Aortic Atherosclerosis (ICD10-I70.0).   04/23/2021 Imaging   CT CAP  IMPRESSION: 1. Treated metastatic lesion between segments 2 and 3 of the liver, slightly smaller and less distinct than prior examination. No other signs of definite metastatic disease elsewhere in the abdomen or pelvis. 2. Multiple small pulmonary nodules, stable compared to the prior examination, favored to be benign. No definitive findings to suggest metastatic disease to the thorax. 3. Aortic atherosclerosis. 4. Additional incidental findings, as above.   08/13/2021 Imaging   EXAM: CT CHEST, ABDOMEN, AND PELVIS WITH CONTRAST  IMPRESSION: 1. A previously seen PET avid lesion of the anterior left lobe of the liver, hepatic segment II, is no longer discretely appreciable consistent with treatment response of a hepatic metastasis. 2. No evidence of new metastatic disease in the chest, abdomen, or pelvis. 3. Interval increase in a small focus of consolidation and nodularity of the medial left upper lobe, consistent with minimal, ongoing atypical infection. Additional tiny bilateral pulmonary nodules are stable and almost certainly  incidental benign. Attention on follow-up. 4. Status post right hemicolectomy and ileocolic anastomosis.   07/02/2022 Imaging    IMPRESSION: CHEST IMPRESSION:   1. No evidence of thoracic metastasis. 2. Stable small pulmonary nodules.   PELVIS IMPRESSION:   1. Stable to slight decrease in size of subcapsular lesion in the RIGHT hepatic lobe. 2. No evidence of new or progressive  disease in the abdomen pelvis. 3.  Aortic Atherosclerosis (ICD10-I70.0).     Rectal cancer metastasized to liver ([place])  06/15/2020 Procedure   Screening Colonoscopy by Dr [name]  IMPRESSION - Decreased sphincter tone and internal hemorrhoids that prolapse with straining, but require manual replacement into the anal canal (Grade III) found on digital rectal exam. - Patent side-to-side ileo-colonic anastomosis, characterized by healthy appearing mucosa. - The examined portion of the ileum was normal. - One diminutive polyp in the proximal transverse colon, removed with a cold biopsy forceps. Resected and retrieved. - Likely malignant partially obstructing tumor in the mid rectum. Biopsied. Tattooed. - The examination was otherwise normal on direct and retroflexion views.   06/15/2020 Initial Biopsy   Diagnosis 1. Transverse Colon Polyp - HYPERPLASTIC POLYP 2. Rectum, biopsy - ADENOCARCINOMA ARISING IN A TUBULAR ADENOMA WITH HIGH-GRADE DYSPLASIA. SEE NOTE Diagnosis Note 2. Dr. [name] reviewed the case and concurs with the diagnosis. Dr. [name] was notified on 06/16/2020.   06/28/2020 Imaging   CT CAP  IMPRESSION: 1. New low-density focus in the anterior aspect of the lateral segment LEFT hepatic lobe measuring 1.2 x 1.0 cm, compatible with small metastatic lesion in the LEFT hepatic lobe. 2. Soft tissue in the RIGHT iliac fossa following RIGHT hemicolectomy invades the psoas musculature and is slowly enlarging over time, more linear on the prior study now highly concerning for recurrence/metastasis to this location. 3. Signs of enteritis, potentially post radiation changes of the small bowel. Tethered small bowel in the RIGHT lower quadrant shows focal thickening and narrowing suspicious for small bowel involvement and developing partial obstruction though currently contrast passes beyond this point into the colon. 4. Rectal thickening in this patient with known rectal mass  as described. 5. No evidence of metastatic disease in the chest. 6. Stable small pulmonary nodules. 7.  and aortic atherosclerosis.   Aortic Atherosclerosis (ICD10-I70.0) and Emphysema (ICD10-J43.9).   07/04/2020 Initial Diagnosis   Rectal cancer metastasized to liver ([place])   07/12/2020 PET scan   IMPRESSION: 1. Exam positive for FDG avid rectal tumor which corresponds to the recent colonoscopy findings. 2. FDG avid soft tissue mass within the right iliac fossa is noted and consistent with local tumor recurrence from previous ascending colon tumor. 3. Lateral segment left lobe of liver lesion is FDG avid concerning for liver metastasis. 4. No specific findings identified to suggest metastatic disease to the chest.   08/10/2020 -  Chemotherapy   First-line FOLFIRI q2weeks starting 08/10/20. dose reduced with cycle 1. Irinotecan/5FU increased and Bevacizumab added with cycle 2 on 08/23/2020    08/17/2020 - 03/09/2022 Chemotherapy   Patient is on Treatment Plan : COLORECTAL FOLFIRI + Bevacizumab q14d     10/27/2020 Imaging   CT CAP  IMPRESSION: 1. Interval decrease in size of the hypermetabolic left hepatic lesion, consistent with metastatic disease. No new liver lesion evident. 2. Interval resolution of the hypermetabolic soft tissue lesion along the right iliac fossa with no measurable soft tissue lesion remaining at this location today. 3. Similar appearance of soft tissue fullness in the rectum at the site of the hypermetabolic lesion  seen previously. 4. Stable tiny bilateral pulmonary nodules. Continued attention on follow-up recommended. 5. Small volume free fluid in the pelvis. 6. Aortic Atherosclerosis (ICD10-I70.0).   01/22/2021 Imaging   CT CAP  IMPRESSION: CT CHEST IMPRESSION   1. Similar nonspecific pulmonary nodules. 2. New posterior left upper lobe reticulonodular opacity, suspicious for interval mild infection or inflammation. 3. No thoracic adenopathy.   CT  ABDOMEN AND PELVIS IMPRESSION   1. Further decrease in size of high left hepatic lobe 3 mm low-density lesion. No new or progressive metastatic disease within the abdomen or pelvis. 2. Similar trace free pelvic fluid. 3. Similar nonspecific mid rectal wall thickening. 4.  Aortic Atherosclerosis (ICD10-I70.0).   04/23/2021 Imaging   CT CAP  IMPRESSION: 1. Treated metastatic lesion between segments 2 and 3 of the liver, slightly smaller and less distinct than prior examination. No other signs of definite metastatic disease elsewhere in the abdomen or pelvis. 2. Multiple small pulmonary nodules, stable compared to the prior examination, favored to be benign. No definitive findings to suggest metastatic disease to the thorax. 3. Aortic atherosclerosis. 4. Additional incidental findings, as above.   08/13/2021 Imaging   EXAM: CT CHEST, ABDOMEN, AND PELVIS WITH CONTRAST  IMPRESSION: 1. A previously seen PET avid lesion of the anterior left lobe of the liver, hepatic segment II, is no longer discretely appreciable consistent with treatment response of a hepatic metastasis. 2. No evidence of new metastatic disease in the chest, abdomen, or pelvis. 3. Interval increase in a small focus of consolidation and nodularity of the medial left upper lobe, consistent with minimal, ongoing atypical infection. Additional tiny bilateral pulmonary nodules are stable and almost certainly incidental benign. Attention on follow-up. 4. Status post right hemicolectomy and ileocolic anastomosis.   03/28/2022 -  Chemotherapy   Patient is on Treatment Plan : COLORECTAL FOLFOX + Bevacizumab q14d     07/02/2022 Imaging    IMPRESSION: CHEST IMPRESSION:   1. No evidence of thoracic metastasis. 2. Stable small pulmonary nodules.   PELVIS IMPRESSION:   1. Stable to slight decrease in size of subcapsular lesion in the RIGHT hepatic lobe. 2. No evidence of new or progressive disease in the abdomen  pelvis.      INTERVAL HISTORY:  Brittney Spencer is here for a follow up of  metastatic rectal cancer, h/o colon cancer     She was last seen by me on 08/29/2022 She presents to the clinic alone. Pt state the last treatment was a lot better due to reduce dose. Pt reports concerns of the back and fourth with the reduction of the chemo dosage.     All other systems were reviewed with the patient and are negative.  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 Some residual intraretinal fluid remains, very small localized subfoveal of fluid remains although this continues to  slowly resorb. We'll continue to observe.   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  ALLERGIES:  is allergic to venofer [iron sucrose], fish  allergy, peanut-containing drug products, soy allergy, and buspirone.  MEDICATIONS:  Current Outpatient Medications  Medication Sig Dispense Refill   ALPRAZolam (XANAX) 0.25 MG tablet Take 1 tablet (0.25 mg total) by mouth daily as needed for anxiety. 30 tablet 0   Cholecalciferol (VITAMIN D3 PO) Take by mouth daily.     docusate sodium (COLACE) 100 MG capsule 1 capsule as needed     lidocaine-prilocaine (EMLA) cream Apply 1 Application topically as needed. 30 g 1   NORVASC 2.5 MG tablet Take 1 tablet (2.5 mg total) by mouth daily. 90 tablet 1   ondansetron (ZOFRAN) 8 MG tablet Take 1 tablet (8 mg total) by mouth every 8 (eight) hours as needed for nausea or vomiting. 20 tablet 2   prochlorperazine (COMPAZINE) 10 MG tablet Take 1 tablet (10 mg total) by mouth every 6 (six) hours as needed for nausea or vomiting. 30 tablet 2   Current Facility-Administered Medications  Medication Dose Route Frequency Provider Last Rate Last Admin   0.9 %  sodium chloride infusion  500 mL Intravenous Once Danis, Estill Cotta III, MD        PHYSICAL EXAMINATION: ECOG PERFORMANCE STATUS: 1 - Symptomatic but completely ambulatory  Vitals:   09/12/22 0840  BP: 127/81  Pulse: 71  Resp: 15  Temp: 98.1 F (36.7 C)  SpO2: 100%   Wt Readings from Last 3 Encounters:  09/12/22 102 lb 8 oz (46.5 kg)  08/29/22 102 lb 9.6 oz (46.5 kg)  08/15/22 104 lb (47.2 kg)     GENERAL:alert, no distress and comfortable SKIN: skin color normal, no rashes or significant lesions EYES: normal, Conjunctiva are pink and non-injected, sclera clear  NEURO: alert & oriented x 3 with fluent speech   LABORATORY DATA:  I have reviewed the data as listed    Latest Ref Rng & Units 09/12/2022    8:13 AM 08/29/2022    8:59 AM 08/15/2022    8:05 AM  CBC  WBC 4.0 - 10.5 K/uL 5.9  5.5  5.4   Hemoglobin 12.0 - 15.0 g/dL 9.2  8.8  9.4   Hematocrit 36.0 - 46.0 % 29.7  28.8  30.0   Platelets 150 - 400 K/uL 95  84  114         Latest  Ref Rng & Units 09/12/2022    8:13 AM 08/29/2022    8:59 AM 08/15/2022    8:05 AM  CMP  Glucose 70 - 99 mg/dL 98  94  81   BUN 8 - 23 mg/dL '8  11  12   '$ Creatinine 0.44 - 1.00 mg/dL 0.66  0.54  0.77   Sodium 135 - 145 mmol/L 140  137  137   Potassium 3.5 - 5.1 mmol/L 3.7  3.9  4.0   Chloride 98 - 111 mmol/L 109  106  104   CO2 22 - 32 mmol/L '26  26  24   '$ Calcium 8.9 - 10.3 mg/dL 8.4  8.2  8.6   Total Protein 6.5 - 8.1 g/dL 6.5  6.0  6.8   Total Bilirubin 0.3 - 1.2 mg/dL 0.3  0.2  0.5   Alkaline Phos 38 - 126 U/L 83  84  81   AST 15 - 41 U/L '21  18  22   '$ ALT 0 - 44 U/L '10  8  11       '$ RADIOGRAPHIC STUDIES: I have personally reviewed the radiological images as listed and agreed with the findings in the report. No results found.    Orders Placed This Encounter  Procedures   CT CHEST ABDOMEN PELVIS W CONTRAST    Standing Status:   Future    Standing Expiration Date:   09/12/2023    Order Specific Question:   Preferred imaging location?    Answer:   [facility]    Order Specific Question:   Is Oral Contrast requested for this exam?    Answer:   Yes, Per Radiology protocol   CBC with Differential ([place] Only)    Standing Status:   Future    Standing Expiration Date:   11/01/2023   CMP ([place] only)    Standing Status:   Future    Standing Expiration Date:   11/01/2023   Total Protein, Urine dipstick    Standing Status:   Future    Standing Expiration Date:   11/01/2023   CBC with Differential ([place] Only)    Standing Status:   Future    Standing Expiration Date:   11/15/2023   CMP ([place] only)    Standing Status:   Future    Standing Expiration Date:   11/15/2023   Total Protein, Urine dipstick    Standing Status:   Future    Standing Expiration Date:   11/15/2023   All questions were answered. The patient knows to call the clinic with any problems, questions or concerns. No barriers to learning was detected. The total time spent in the  appointment was 30 minutes.     [name], MD 09/12/2022   Felicity Coyer, CMA, am acting as scribe for [name], MD.   I have reviewed the above documentation for accuracy and completeness, and I agree with the above.

## 2022-09-11 NOTE — Assessment & Plan Note (Signed)
-  pt has a living will and agrees with DNR, this is documented in her chart  

## 2022-09-11 NOTE — Assessment & Plan Note (Signed)
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  -She began first-line chemo with dose-reduced FOLFIRI and Beva in 2/22.  -we changed to FOLFOX with continued Beva on 03/28/22. She tolerated well overall with some fatigue and cold sensitivity. -restaging CT on 07/02/2022 showed overall SD with slight improvement, no new lesions -She is tolerating chemotherapy overall well, will continue. 

## 2022-09-11 NOTE — Assessment & Plan Note (Signed)
will monitor iron level  -worse lately, she received iv venofer '600mg'$  in past month, with minimum response  -will monitor iron level

## 2022-09-12 ENCOUNTER — Inpatient Hospital Stay: Payer: Federal, State, Local not specified - PPO

## 2022-09-12 ENCOUNTER — Inpatient Hospital Stay (HOSPITAL_BASED_OUTPATIENT_CLINIC_OR_DEPARTMENT_OTHER): Payer: Federal, State, Local not specified - PPO | Admitting: Hematology

## 2022-09-12 ENCOUNTER — Other Ambulatory Visit: Payer: Self-pay

## 2022-09-12 ENCOUNTER — Encounter: Payer: Self-pay | Admitting: Hematology

## 2022-09-12 ENCOUNTER — Inpatient Hospital Stay: Payer: Federal, State, Local not specified - PPO | Admitting: Nutrition

## 2022-09-12 VITALS — BP 127/81 | HR 71 | Temp 98.1°F | Resp 15 | Ht 66.0 in | Wt 102.5 lb

## 2022-09-12 VITALS — BP 137/74 | HR 74 | Temp 98.3°F | Resp 16

## 2022-09-12 DIAGNOSIS — D500 Iron deficiency anemia secondary to blood loss (chronic): Secondary | ICD-10-CM

## 2022-09-12 DIAGNOSIS — Z5112 Encounter for antineoplastic immunotherapy: Secondary | ICD-10-CM | POA: Diagnosis not present

## 2022-09-12 DIAGNOSIS — Z66 Do not resuscitate: Secondary | ICD-10-CM

## 2022-09-12 DIAGNOSIS — C20 Malignant neoplasm of rectum: Secondary | ICD-10-CM

## 2022-09-12 DIAGNOSIS — C182 Malignant neoplasm of ascending colon: Secondary | ICD-10-CM

## 2022-09-12 DIAGNOSIS — Z95828 Presence of other vascular implants and grafts: Secondary | ICD-10-CM

## 2022-09-12 DIAGNOSIS — C787 Secondary malignant neoplasm of liver and intrahepatic bile duct: Secondary | ICD-10-CM

## 2022-09-12 LAB — CBC WITH DIFFERENTIAL (CANCER CENTER ONLY)
Abs Immature Granulocytes: 0.04 10*3/uL (ref 0.00–0.07)
Basophils Absolute: 0 10*3/uL (ref 0.0–0.1)
Basophils Relative: 1 %
Eosinophils Absolute: 0.1 10*3/uL (ref 0.0–0.5)
Eosinophils Relative: 1 %
HCT: 29.7 % — ABNORMAL LOW (ref 36.0–46.0)
Hemoglobin: 9.2 g/dL — ABNORMAL LOW (ref 12.0–15.0)
Immature Granulocytes: 1 %
Lymphocytes Relative: 14 %
Lymphs Abs: 0.8 10*3/uL (ref 0.7–4.0)
MCH: 26.9 pg (ref 26.0–34.0)
MCHC: 31 g/dL (ref 30.0–36.0)
MCV: 86.8 fL (ref 80.0–100.0)
Monocytes Absolute: 0.6 10*3/uL (ref 0.1–1.0)
Monocytes Relative: 11 %
Neutro Abs: 4.3 10*3/uL (ref 1.7–7.7)
Neutrophils Relative %: 72 %
Platelet Count: 95 10*3/uL — ABNORMAL LOW (ref 150–400)
RBC: 3.42 MIL/uL — ABNORMAL LOW (ref 3.87–5.11)
RDW: 21.5 % — ABNORMAL HIGH (ref 11.5–15.5)
WBC Count: 5.9 10*3/uL (ref 4.0–10.5)
nRBC: 0 % (ref 0.0–0.2)

## 2022-09-12 LAB — CMP (CANCER CENTER ONLY)
ALT: 10 U/L (ref 0–44)
AST: 21 U/L (ref 15–41)
Albumin: 3.4 g/dL — ABNORMAL LOW (ref 3.5–5.0)
Alkaline Phosphatase: 83 U/L (ref 38–126)
Anion gap: 5 (ref 5–15)
BUN: 8 mg/dL (ref 8–23)
CO2: 26 mmol/L (ref 22–32)
Calcium: 8.4 mg/dL — ABNORMAL LOW (ref 8.9–10.3)
Chloride: 109 mmol/L (ref 98–111)
Creatinine: 0.66 mg/dL (ref 0.44–1.00)
GFR, Estimated: >60 mL/min (ref >60)
Glucose, Bld: 98 mg/dL (ref 70–99)
Potassium: 3.7 mmol/L (ref 3.5–5.1)
Sodium: 140 mmol/L (ref 135–145)
Total Bilirubin: 0.3 mg/dL (ref 0.3–1.2)
Total Protein: 6.5 g/dL (ref 6.5–8.1)

## 2022-09-12 LAB — CEA (IN HOUSE-CHCC): CEA (CHCC-In House): 16.25 ng/mL — ABNORMAL HIGH (ref 0.00–5.00)

## 2022-09-12 LAB — FERRITIN: Ferritin: 303 ng/mL (ref 11–307)

## 2022-09-12 MED ORDER — DEXTROSE 5 % IV SOLN: Freq: Once | INTRAVENOUS | Status: DC

## 2022-09-12 MED ORDER — SODIUM CHLORIDE 0.9% FLUSH: 10.0000 mL | INTRAVENOUS | Status: DC | PRN

## 2022-09-12 MED ORDER — DEXTROSE 5 % IV SOLN: Freq: Once | INTRAVENOUS | Status: AC

## 2022-09-12 MED ORDER — PALONOSETRON HCL INJECTION 0.25 MG/5ML
0.2500 mg | Freq: Once | INTRAVENOUS | Status: AC
  Administered 2022-09-12: 0.25 mg via INTRAVENOUS
  Filled 2022-09-12: qty 5

## 2022-09-12 MED ORDER — SODIUM CHLORIDE 0.9 % IV SOLN
5.0000 mg/kg | Freq: Once | INTRAVENOUS | Status: AC
  Administered 2022-09-12: 250 mg via INTRAVENOUS
  Filled 2022-09-12: qty 10

## 2022-09-12 MED ORDER — OXALIPLATIN CHEMO INJECTION 100 MG/20ML
60.0000 mg/m2 | Freq: Once | INTRAVENOUS | Status: AC
  Administered 2022-09-12: 100 mg via INTRAVENOUS
  Filled 2022-09-12: qty 20

## 2022-09-12 MED ORDER — SODIUM CHLORIDE 0.9 % IV SOLN
10.0000 mg | Freq: Once | INTRAVENOUS | Status: AC
  Administered 2022-09-12: 10 mg via INTRAVENOUS
  Filled 2022-09-12: qty 10

## 2022-09-12 MED ORDER — SODIUM CHLORIDE 0.9% FLUSH
10.0000 mL | Freq: Once | INTRAVENOUS | Status: AC
  Administered 2022-09-12: 10 mL

## 2022-09-12 MED ORDER — HEPARIN SOD (PORK) LOCK FLUSH 100 UNIT/ML IV SOLN: 500.0000 [IU] | Freq: Once | INTRAVENOUS | Status: DC | PRN

## 2022-09-12 MED ORDER — SODIUM CHLORIDE 0.9 % IV SOLN
1800.0000 mg/m2 | INTRAVENOUS | Status: DC
  Administered 2022-09-12: 2500 mg via INTRAVENOUS
  Filled 2022-09-12: qty 50

## 2022-09-12 MED ORDER — LEUCOVORIN CALCIUM INJECTION 350 MG
400.0000 mg/m2 | Freq: Once | INTRAVENOUS | Status: AC
  Administered 2022-09-12: 600 mg via INTRAVENOUS
  Filled 2022-09-12: qty 30

## 2022-09-12 MED ORDER — SODIUM CHLORIDE 0.9 % IV SOLN: Freq: Once | INTRAVENOUS | Status: AC

## 2022-09-12 NOTE — Progress Notes (Signed)
Nutrition follow-up completed with patient during infusion for metastatic rectal cancer.  Weight improved to 102 pounds 8 ounces on March 14.  This is stable.  Labs include albumin of 3.4.  Patient reports good appetite and intake. She has questions about high iron foods and strategies to enhance iron absorption.  Also would like information on strategies to avoid decreasing iron absorption.  Patient also wonders if she can consume chocolate Ensure plus.  Nutrition diagnosis: Food and nutrition related knowledge deficit related to metastatic rectal cancer and associated treatments as evidenced by no prior need for nutrition related information.  Intervention: Educated on high iron foods and strategies to increase iron absorption. Educated on foods and beverages to avoid which are known to decrease iron absorption. Provided nutrition facts sheets on anemia and high iron foods. Agree patient could consume Ensure Plus if she desires.  Monitoring, evaluation, goals: Patient will tolerate adequate calories and protein to minimize loss of lean body mass.  Next visit: To be scheduled as needed.  **Disclaimer: This note was dictated with voice recognition software. Similar sounding words can inadvertently be transcribed and this note may contain transcription errors which may not have been corrected upon publication of note.**

## 2022-09-12 NOTE — Patient Instructions (Signed)
[facility]  Discharge Instructions: Thank you for choosing [facility] to provide your oncology and hematology care.   If you have a lab appointment with the [facility], please go directly to the [facility] and check in at the registration area.   Wear comfortable clothing and clothing appropriate for easy access to any Portacath or PICC line.   We strive to give you quality time with your provider. You may need to reschedule your appointment if you arrive late (15 or more minutes).  Arriving late affects you and other patients whose appointments are after yours.  Also, if you miss three or more appointments without notifying the office, you may be dismissed from the clinic at the provider's discretion.      For prescription refill requests, have your pharmacy contact our office and allow 72 hours for refills to be completed.    Today you received the following chemotherapy and/or immunotherapy agents: Zirabev, eloxatin, and fluorouracil         To help prevent nausea and vomiting after your treatment, we encourage you to take your nausea medication as directed.  BELOW ARE SYMPTOMS THAT SHOULD BE REPORTED IMMEDIATELY: *FEVER GREATER THAN 100.4 F (38 C) OR HIGHER *CHILLS OR SWEATING *NAUSEA AND VOMITING THAT IS NOT CONTROLLED WITH YOUR NAUSEA MEDICATION *UNUSUAL SHORTNESS OF BREATH *UNUSUAL BRUISING OR BLEEDING *URINARY PROBLEMS (pain or burning when urinating, or frequent urination) *BOWEL PROBLEMS (unusual diarrhea, constipation, pain near the anus) TENDERNESS IN MOUTH AND THROAT WITH OR WITHOUT PRESENCE OF ULCERS (sore throat, sores in mouth, or a toothache) UNUSUAL RASH, SWELLING OR PAIN  UNUSUAL VAGINAL DISCHARGE OR ITCHING   Items with * indicate a potential emergency and should be followed up as soon as possible or go to the Emergency Department if any problems should occur.  Please show the CHEMOTHERAPY ALERT CARD or  IMMUNOTHERAPY ALERT CARD at check-in to the Emergency Department and triage nurse.  Should you have questions after your visit or need to cancel or reschedule your appointment, please contact [facility]  Dept: [phone]  and follow the prompts.  Office hours are 8:00 a.m. to 4:30 p.m. Monday - Friday. Please note that voicemails left after 4:00 p.m. may not be returned until the following business day.  We are closed weekends and major holidays. You have access to a nurse at all times for urgent questions. Please call the main number to the clinic Dept: [phone] and follow the prompts.   For any non-urgent questions, you may also contact your provider using MyChart. We now offer e-Visits for anyone 11 and older to request care online for non-urgent symptoms. For details visit [email or web address].   Also download the MyChart app! Go to the app store, search "MyChart", open the app, select , and log in with your MyChart username and password.

## 2022-09-12 NOTE — Progress Notes (Signed)
Oxaliplatin dose rounded to vial size (100 mg).  Kennith Center, Pharm.D., CPP 09/12/2022'@12'$ :03 PM

## 2022-09-12 NOTE — Progress Notes (Signed)
Ok to treat with platelets 95,000 per Dr. Burr Medico.

## 2022-09-14 ENCOUNTER — Inpatient Hospital Stay: Payer: Federal, State, Local not specified - PPO

## 2022-09-14 VITALS — BP 125/66 | HR 87 | Temp 97.5°F | Resp 15

## 2022-09-14 DIAGNOSIS — Z5112 Encounter for antineoplastic immunotherapy: Secondary | ICD-10-CM | POA: Diagnosis not present

## 2022-09-14 DIAGNOSIS — C20 Malignant neoplasm of rectum: Secondary | ICD-10-CM

## 2022-09-14 MED ORDER — PEGFILGRASTIM-JMDB 6 MG/0.6ML ~~LOC~~ SOSY
6.0000 mg | PREFILLED_SYRINGE | Freq: Once | SUBCUTANEOUS | Status: AC
  Administered 2022-09-14: 6 mg via SUBCUTANEOUS

## 2022-09-14 MED ORDER — HEPARIN SOD (PORK) LOCK FLUSH 100 UNIT/ML IV SOLN
500.0000 [IU] | Freq: Once | INTRAVENOUS | Status: AC | PRN
  Administered 2022-09-14: 500 [IU]

## 2022-09-14 MED ORDER — SODIUM CHLORIDE 0.9% FLUSH
10.0000 mL | INTRAVENOUS | Status: DC | PRN
  Administered 2022-09-14: 10 mL

## 2022-09-26 ENCOUNTER — Ambulatory Visit: Payer: Federal, State, Local not specified - PPO

## 2022-09-26 ENCOUNTER — Ambulatory Visit: Payer: Federal, State, Local not specified - PPO | Admitting: Hematology

## 2022-09-26 ENCOUNTER — Other Ambulatory Visit: Payer: Federal, State, Local not specified - PPO

## 2022-09-30 ENCOUNTER — Telehealth: Payer: Self-pay | Admitting: Hematology

## 2022-09-30 NOTE — Telephone Encounter (Signed)
Scheduled appointment per staff message by Carolyne Fiscal. Left voicemail for patient.

## 2022-10-01 ENCOUNTER — Other Ambulatory Visit: Payer: Self-pay

## 2022-10-02 MED FILL — Dexamethasone Sodium Phosphate Inj 100 MG/10ML: INTRAMUSCULAR | Qty: 1 | Status: AC

## 2022-10-02 NOTE — Assessment & Plan Note (Signed)
diagnosed in 12/2015. S/p right hemicolectomy with right salpingo oophorectomy and adjuvant ChemoRT. 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 

## 2022-10-02 NOTE — Assessment & Plan Note (Signed)
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  -She began first-line chemo with dose-reduced FOLFIRI and Beva in 2/22.  -we changed to FOLFOX with continued Beva on 03/28/22. She tolerated well overall with some fatigue and cold sensitivity. -restaging CT on 07/02/2022 showed overall SD with slight improvement, no new lesions -She is tolerating chemotherapy overall well, will continue. 

## 2022-10-02 NOTE — Progress Notes (Signed)
[facility]   Telephone:([phone]) [phone] Fax:([phone]) [phone]   Clinic Follow up Note   Patient Care Team: Patient, No Pcp Per as PCP - General (General Practice) [name], NP as Nurse Practitioner (Oncology) [name], MD as Consulting Physician (Oncology) [name], RN (Inactive) as Oncology Nurse Navigator  Date of Service:  10/03/2022  CHIEF COMPLAINT: f/u of  metastatic rectal cancer, h/o colon cancer         CURRENT THERAPY:   FOLFOX with bevacizumab, q14d, starting 03/28/22     ASSESSMENT:  Brittney Spencer is a 72 y.o. female with   Rectal cancer metastasized to liver ([place]) -Stage IV with liver and right pelvic metastasis, recurrence from previous colon cancer vs new primary   -Diagnosed by screening colonoscopy 05/2020.  -She began first-line chemo with dose-reduced FOLFIRI and Beva in 2/22.  -we changed to FOLFOX with continued Beva on 03/28/22. She tolerated well overall with some fatigue and cold sensitivity. -restaging CT on 07/02/2022 showed overall SD with slight improvement, no new lesions -She is tolerating chemotherapy overall well, will continue.  Cancer of ascending colon ([place]) diagnosed in 12/2015. S/p right hemicolectomy with right salpingo oophorectomy and adjuvant ChemoRT. 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 -CT scan schedule  4/8, I will call her on April 11 to review his CT scan findings -lab/flush and FOLFOX 4/18    SUMMARY OF ONCOLOGIC HISTORY: Oncology History Overview Note  Cancer Staging Cancer of ascending colon [facility]) Staging form: Colon and Rectum, AJCC 7th Edition - Clinical stage from 02/07/2016: Stage IIIC (T4b, N1b, M0) - Signed by [name], MD on 03/04/2016 Laterality: Right Residual tumor (R): R2 - Macroscopic    Cancer of ascending colon  10/25/2015 Imaging   CT ABD/PELVIS:  Inflammatory changes inferior to the cecal tip appear improved, there is still irregular soft tissue thickening of the cecal tip, and there are adjacent prominent lymph nodes in the ileocolonic mesentery, measuring 13 mm on image 49 and 8 mm on image 52. In addition, there is a 2.5 x 1.8 cm nodule on image 46 which has central low density. Therefore, these findings are moderately suspicious for an underlying cecal malignancy with perforation.    01/19/2016 Procedure   COLONOSCOPY per Dr. [name]: Fungating, ulcerated mass almost obstructing mid ascending colon   01/19/2016 Initial Biopsy   Diagnosis Surgical [P], cecal mass - INVASIVE ADENOCARCINOMA WITH ULCERATION. - SEE COMMENT.   02/05/2016 Tumor Marker   Patient's tumor was tested for the following markers: CEA Results of the tumor marker test revealed 5.7.   02/07/2016 Initial Diagnosis   Cancer of ascending colon ([place])   02/07/2016 Definitive Surgery   Laparoscopic assisted right hemicolectomy and right salpingo oopherectomy--Dr. [name]   02/07/2016 Pathologic Stage   p T4 N1b   2/43 nodes +   02/07/2016 Pathology Results   MMR normal; G2 adenocarcinoma;proximal & distal margins negative;  soft tissue mass on pelvic sidewall + for adenocarcinoma with positive margin MSI Stable   03/08/2016 Imaging   CT chest negative for metastasis.    03/19/2016 - 04/25/2016 Radiation Therapy   Adjuvant irradiation, 50 gray in 28 fractions   03/19/2016 - 04/22/2016  Chemotherapy   Xeloda 1500 mg twice daily, started on 03/19/2016, dose reduced to 1000 mg twice daily from week 3 due to neutropenia, and patient stopped 3 days before last dose radiation due to difficulty swallowing the pill    05/20/2016 -  Adjuvant Chemotherapy   Patient declined adjuvant chemotherapy   09/16/2016 Imaging   CT CAP w Contrast 1. No evidence of local tumor recurrence at the ileocolic anastomosis. 2. No findings suspicious for metastatic disease in the chest, abdomen or pelvis. 3. Nonspecific trace free fluid in the pelvic cul-de-sac. 4. Stable solitary 3 mm right upper lobe pulmonary nodule, for which 6 month stability has been demonstrated, probably benign. 5. Additional findings include stable right posterior pericardial cyst and small calcified uterine fibroids.   05/13/2017 Imaging   CT CAP W Contrast 05/13/17 IMPRESSION: 1. No current findings of residual or recurrent malignancy. 2. Mild prominence of stool throughout the colon. Nondistended portions of the rectum. 3. Several tiny pulmonary nodules are stable from the earliest available comparison of 03/08/2016 and probably benign, but may merit surveillance. 4. Other imaging findings of potential clinical significance: Old granulomatous disease. Aortoiliac atherosclerotic vascular disease. Lumbar spondylosis and degenerative disc disease. Stable amount of trace free pelvic fluid.   04/27/2018 Imaging   04/27/2018 CT CAP IMPRESSION: Stable exam. No evidence of recurrent or metastatic carcinoma within the chest, abdomen, or pelvis   04/19/2019 Imaging   CT CAP W Contrast  IMPRESSION: Chest Impression:   1. No evidence of thoracic metastasis. 2. Stable small bilateral pulmonary nodules.   Abdomen / Pelvis Impression:   1. No evidence local colorectal carcinoma recurrence or metastasis in the abdomen pelvis. 2. Post RIGHT hemicolectomy.   01/22/2021 Imaging   CT CAP  IMPRESSION: CT CHEST  IMPRESSION   1. Similar nonspecific pulmonary nodules. 2. New posterior left upper lobe reticulonodular opacity, suspicious for interval mild infection or inflammation. 3. No thoracic adenopathy.   CT ABDOMEN AND PELVIS IMPRESSION   1. Further decrease in size of high left hepatic lobe 3 mm low-density lesion. No new or progressive metastatic disease within the abdomen or pelvis. 2. Similar trace free pelvic fluid. 3. Similar nonspecific mid rectal wall thickening. 4.  Aortic Atherosclerosis (ICD10-I70.0).   04/23/2021 Imaging   CT CAP  IMPRESSION: 1. Treated metastatic lesion between segments 2 and 3 of the liver, slightly smaller and less distinct than prior examination. No other signs of definite metastatic disease elsewhere in the abdomen or pelvis. 2. Multiple small pulmonary nodules, stable compared to the prior examination, favored to be benign. No definitive findings to suggest metastatic disease to the thorax. 3. Aortic atherosclerosis. 4. Additional incidental findings, as above.   08/13/2021 Imaging   EXAM: CT CHEST, ABDOMEN, AND PELVIS WITH CONTRAST  IMPRESSION: 1. A previously seen PET avid lesion of the anterior left lobe of the liver, hepatic segment II, is no longer discretely appreciable consistent with treatment response of a hepatic metastasis. 2. No evidence of new metastatic disease in the chest, abdomen, or pelvis. 3. Interval increase in a small focus of consolidation and nodularity of the medial left upper lobe, consistent with minimal, ongoing atypical infection. Additional tiny bilateral pulmonary nodules are stable and almost  certainly incidental benign. Attention on follow-up. 4. Status post right hemicolectomy and ileocolic anastomosis.   07/02/2022 Imaging    IMPRESSION: CHEST IMPRESSION:   1. No evidence of thoracic metastasis. 2. Stable small pulmonary nodules.   PELVIS IMPRESSION:   1. Stable to slight decrease in size of  subcapsular lesion in the RIGHT hepatic lobe. 2. No evidence of new or progressive disease in the abdomen pelvis. 3.  Aortic Atherosclerosis (ICD10-I70.0).     Rectal cancer metastasized to liver  06/15/2020 Procedure   Screening Colonoscopy by Dr [name]  IMPRESSION - Decreased sphincter tone and internal hemorrhoids that prolapse with straining, but require manual replacement into the anal canal (Grade III) found on digital rectal exam. - Patent side-to-side ileo-colonic anastomosis, characterized by healthy appearing mucosa. - The examined portion of the ileum was normal. - One diminutive polyp in the proximal transverse colon, removed with a cold biopsy forceps. Resected and retrieved. - Likely malignant partially obstructing tumor in the mid rectum. Biopsied. Tattooed. - The examination was otherwise normal on direct and retroflexion views.   06/15/2020 Initial Biopsy   Diagnosis 1. Transverse Colon Polyp - HYPERPLASTIC POLYP 2. Rectum, biopsy - ADENOCARCINOMA ARISING IN A TUBULAR ADENOMA WITH HIGH-GRADE DYSPLASIA. SEE NOTE Diagnosis Note 2. Dr. [name] reviewed the case and concurs with the diagnosis. Dr. [name] was notified on 06/16/2020.   06/28/2020 Imaging   CT CAP  IMPRESSION: 1. New low-density focus in the anterior aspect of the lateral segment LEFT hepatic lobe measuring 1.2 x 1.0 cm, compatible with small metastatic lesion in the LEFT hepatic lobe. 2. Soft tissue in the RIGHT iliac fossa following RIGHT hemicolectomy invades the psoas musculature and is slowly enlarging over time, more linear on the prior study now highly concerning for recurrence/metastasis to this location. 3. Signs of enteritis, potentially post radiation changes of the small bowel. Tethered small bowel in the RIGHT lower quadrant shows focal thickening and narrowing suspicious for small bowel involvement and developing partial obstruction though currently contrast passes beyond this point  into the colon. 4. Rectal thickening in this patient with known rectal mass as described. 5. No evidence of metastatic disease in the chest. 6. Stable small pulmonary nodules. 7.  and aortic atherosclerosis.   Aortic Atherosclerosis (ICD10-I70.0) and Emphysema (ICD10-J43.9).   07/04/2020 Initial Diagnosis   Rectal cancer metastasized to liver ([place])   07/12/2020 PET scan   IMPRESSION: 1. Exam positive for FDG avid rectal tumor which corresponds to the recent colonoscopy findings. 2. FDG avid soft tissue mass within the right iliac fossa is noted and consistent with local tumor recurrence from previous ascending colon tumor. 3. Lateral segment left lobe of liver lesion is FDG avid concerning for liver metastasis. 4. No specific findings identified to suggest metastatic disease to the chest.   08/10/2020 -  Chemotherapy   First-line FOLFIRI q2weeks starting 08/10/20. dose reduced with cycle 1. Irinotecan/5FU increased and Bevacizumab added with cycle 2 on 08/23/2020    08/17/2020 - 03/09/2022 Chemotherapy   Patient is on Treatment Plan : COLORECTAL FOLFIRI + Bevacizumab q14d     10/27/2020 Imaging   CT CAP  IMPRESSION: 1. Interval decrease in size of the hypermetabolic left hepatic lesion, consistent with metastatic disease. No new liver lesion evident. 2. Interval resolution of the hypermetabolic soft tissue lesion along the right iliac fossa with no measurable soft tissue lesion remaining at this location today. 3. Similar appearance of soft tissue fullness in the rectum at the site of the hypermetabolic lesion  seen previously. 4. Stable tiny bilateral pulmonary nodules. Continued attention on follow-up recommended. 5. Small volume free fluid in the pelvis. 6. Aortic Atherosclerosis (ICD10-I70.0).   01/22/2021 Imaging   CT CAP  IMPRESSION: CT CHEST IMPRESSION   1. Similar nonspecific pulmonary nodules. 2. New posterior left upper lobe reticulonodular opacity, suspicious for  interval mild infection or inflammation. 3. No thoracic adenopathy.   CT ABDOMEN AND PELVIS IMPRESSION   1. Further decrease in size of high left hepatic lobe 3 mm low-density lesion. No new or progressive metastatic disease within the abdomen or pelvis. 2. Similar trace free pelvic fluid. 3. Similar nonspecific mid rectal wall thickening. 4.  Aortic Atherosclerosis (ICD10-I70.0).   04/23/2021 Imaging   CT CAP  IMPRESSION: 1. Treated metastatic lesion between segments 2 and 3 of the liver, slightly smaller and less distinct than prior examination. No other signs of definite metastatic disease elsewhere in the abdomen or pelvis. 2. Multiple small pulmonary nodules, stable compared to the prior examination, favored to be benign. No definitive findings to suggest metastatic disease to the thorax. 3. Aortic atherosclerosis. 4. Additional incidental findings, as above.   08/13/2021 Imaging   EXAM: CT CHEST, ABDOMEN, AND PELVIS WITH CONTRAST  IMPRESSION: 1. A previously seen PET avid lesion of the anterior left lobe of the liver, hepatic segment II, is no longer discretely appreciable consistent with treatment response of a hepatic metastasis. 2. No evidence of new metastatic disease in the chest, abdomen, or pelvis. 3. Interval increase in a small focus of consolidation and nodularity of the medial left upper lobe, consistent with minimal, ongoing atypical infection. Additional tiny bilateral pulmonary nodules are stable and almost certainly incidental benign. Attention on follow-up. 4. Status post right hemicolectomy and ileocolic anastomosis.   03/28/2022 -  Chemotherapy   Patient is on Treatment Plan : COLORECTAL FOLFOX + Bevacizumab q14d     07/02/2022 Imaging    IMPRESSION: CHEST IMPRESSION:   1. No evidence of thoracic metastasis. 2. Stable small pulmonary nodules.   PELVIS IMPRESSION:   1. Stable to slight decrease in size of subcapsular lesion in the RIGHT  hepatic lobe. 2. No evidence of new or progressive disease in the abdomen pelvis.      INTERVAL HISTORY:  Brittney Spencer is here for a follow up of  metastatic rectal cancer, h/o colon cancer       She was last seen by me on 09/12/2022 She presents to the clinic .alone. Pt reports that she has some tingling on her hands and her feet. Pt state that she feels it most in the evening.Pt state the her appetite is pretty good.     All other systems were reviewed with the patient and are negative.  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 Some residual intraretinal fluid remains, very small localized subfoveal of fluid remains although this  continues to slowly resorb. We'll continue to observe.   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  ALLERGIES:  is allergic to venofer [iron sucrose], fish allergy, peanut-containing drug products, soy allergy, and buspirone.  MEDICATIONS:  Current Outpatient Medications  Medication Sig Dispense Refill   ALPRAZolam (XANAX) 0.25 MG tablet Take 1 tablet (0.25 mg total) by mouth daily as needed for anxiety. 30 tablet 0   Cholecalciferol (VITAMIN D3 PO) Take by mouth daily.     docusate sodium (COLACE) 100 MG capsule 1 capsule as needed     lidocaine-prilocaine (EMLA) cream Apply 1 Application topically as needed. 30 g 1   NORVASC 2.5 MG tablet Take 1 tablet (2.5 mg total) by mouth daily. 90 tablet 1   ondansetron (ZOFRAN) 8 MG tablet Take 1 tablet (8 mg total) by mouth every 8 (eight) hours as needed for nausea or vomiting. 20 tablet 2   prochlorperazine (COMPAZINE) 10 MG tablet Take 1 tablet (10 mg total) by mouth every 6 (six) hours as needed for nausea or vomiting. 30 tablet 2   Current Facility-Administered Medications  Medication Dose Route Frequency Provider Last Rate Last Admin   0.9 %  sodium chloride infusion  500 mL Intravenous Once Danis, Estill Cotta III, MD        PHYSICAL EXAMINATION: ECOG PERFORMANCE STATUS: 1 - Symptomatic but completely ambulatory  Vitals:   10/03/22 0911  BP: 136/75  Pulse: 85  Resp: 15  Temp: 98.3 F (36.8 C)  SpO2: 100%   Wt Readings from Last 3 Encounters:  10/03/22 100 lb 6.4 oz (45.5 kg)  09/12/22 102 lb 8 oz (46.5 kg)  08/29/22 102 lb 9.6 oz (46.5 kg)     GENERAL:alert, no distress and comfortable SKIN: skin color normal, no rashes or significant lesions EYES: normal, Conjunctiva are pink and non-injected, sclera clear  NEURO: alert & oriented x 3 with fluent speech   LABORATORY DATA:  I have reviewed the data as listed    Latest Ref Rng & Units 10/03/2022    8:16 AM 09/12/2022    8:13 AM 08/29/2022    8:59 AM  CBC  WBC 4.0 - 10.5 K/uL 2.7  5.9  5.5   Hemoglobin 12.0 - 15.0 g/dL 10.0  9.2  8.8   Hematocrit 36.0 -  46.0 % 32.1  29.7  28.8   Platelets 150 - 400 K/uL 125  95  84         Latest Ref Rng & Units 10/03/2022    8:16 AM 09/12/2022    8:13 AM 08/29/2022    8:59 AM  CMP  Glucose 70 - 99 mg/dL 89  98  94   BUN 8 - 23 mg/dL 12  8  11    Creatinine 0.44 - 1.00 mg/dL 0.77  0.66  0.54   Sodium 135 - 145 mmol/L  137  140  137   Potassium 3.5 - 5.1 mmol/L 4.2  3.7  3.9   Chloride 98 - 111 mmol/L 104  109  106   CO2 22 - 32 mmol/L 27  26  26    Calcium 8.9 - 10.3 mg/dL 9.4  8.4  8.2   Total Protein 6.5 - 8.1 g/dL 6.9  6.5  6.0   Total Bilirubin 0.3 - 1.2 mg/dL 0.5  0.3  0.2   Alkaline Phos 38 - 126 U/L 74  83  84   AST 15 - 41 U/L 22  21  18    ALT 0 - 44 U/L 11  10  8        RADIOGRAPHIC STUDIES: I have personally reviewed the radiological images as listed and agreed with the findings in the report. No results found.    No orders of the defined types were placed in this encounter.  All questions were answered. The patient knows to call the clinic with any problems, questions or concerns. No barriers to learning was detected. The total time spent in the appointment was 25 minutes.     [name], MD 10/03/2022   Felicity Coyer, CMA, am acting as scribe for [name], MD.   I have reviewed the above documentation for accuracy and completeness, and I agree with the above.

## 2022-10-03 ENCOUNTER — Inpatient Hospital Stay: Payer: Federal, State, Local not specified - PPO

## 2022-10-03 ENCOUNTER — Encounter: Payer: Self-pay | Admitting: Hematology

## 2022-10-03 ENCOUNTER — Inpatient Hospital Stay: Payer: Federal, State, Local not specified - PPO | Admitting: Hematology

## 2022-10-03 ENCOUNTER — Inpatient Hospital Stay: Payer: Federal, State, Local not specified - PPO | Attending: Nurse Practitioner

## 2022-10-03 VITALS — BP 136/75 | HR 85 | Temp 98.3°F | Resp 15 | Ht 66.0 in | Wt 100.4 lb

## 2022-10-03 VITALS — BP 143/79 | HR 66 | Resp 18

## 2022-10-03 DIAGNOSIS — G620 Drug-induced polyneuropathy: Secondary | ICD-10-CM | POA: Insufficient documentation

## 2022-10-03 DIAGNOSIS — Z5189 Encounter for other specified aftercare: Secondary | ICD-10-CM | POA: Diagnosis not present

## 2022-10-03 DIAGNOSIS — C20 Malignant neoplasm of rectum: Secondary | ICD-10-CM

## 2022-10-03 DIAGNOSIS — Z9221 Personal history of antineoplastic chemotherapy: Secondary | ICD-10-CM | POA: Diagnosis not present

## 2022-10-03 DIAGNOSIS — Z1509 Genetic susceptibility to other malignant neoplasm: Secondary | ICD-10-CM | POA: Diagnosis not present

## 2022-10-03 DIAGNOSIS — Z66 Do not resuscitate: Secondary | ICD-10-CM | POA: Insufficient documentation

## 2022-10-03 DIAGNOSIS — Z79899 Other long term (current) drug therapy: Secondary | ICD-10-CM | POA: Diagnosis not present

## 2022-10-03 DIAGNOSIS — Z5112 Encounter for antineoplastic immunotherapy: Secondary | ICD-10-CM | POA: Diagnosis present

## 2022-10-03 DIAGNOSIS — Z95828 Presence of other vascular implants and grafts: Secondary | ICD-10-CM

## 2022-10-03 DIAGNOSIS — C787 Secondary malignant neoplasm of liver and intrahepatic bile duct: Secondary | ICD-10-CM | POA: Diagnosis not present

## 2022-10-03 DIAGNOSIS — T451X5D Adverse effect of antineoplastic and immunosuppressive drugs, subsequent encounter: Secondary | ICD-10-CM | POA: Insufficient documentation

## 2022-10-03 DIAGNOSIS — C182 Malignant neoplasm of ascending colon: Secondary | ICD-10-CM

## 2022-10-03 DIAGNOSIS — Z9049 Acquired absence of other specified parts of digestive tract: Secondary | ICD-10-CM | POA: Diagnosis not present

## 2022-10-03 DIAGNOSIS — Z923 Personal history of irradiation: Secondary | ICD-10-CM | POA: Insufficient documentation

## 2022-10-03 DIAGNOSIS — Z5111 Encounter for antineoplastic chemotherapy: Secondary | ICD-10-CM | POA: Insufficient documentation

## 2022-10-03 LAB — CBC WITH DIFFERENTIAL (CANCER CENTER ONLY)
Abs Immature Granulocytes: 0.01 10*3/uL (ref 0.00–0.07)
Basophils Absolute: 0 10*3/uL (ref 0.0–0.1)
Basophils Relative: 1 %
Eosinophils Absolute: 0 10*3/uL (ref 0.0–0.5)
Eosinophils Relative: 1 %
HCT: 32.1 % — ABNORMAL LOW (ref 36.0–46.0)
Hemoglobin: 10 g/dL — ABNORMAL LOW (ref 12.0–15.0)
Immature Granulocytes: 0 %
Lymphocytes Relative: 32 %
Lymphs Abs: 0.9 10*3/uL (ref 0.7–4.0)
MCH: 28 pg (ref 26.0–34.0)
MCHC: 31.2 g/dL (ref 30.0–36.0)
MCV: 89.9 fL (ref 80.0–100.0)
Monocytes Absolute: 0.5 10*3/uL (ref 0.1–1.0)
Monocytes Relative: 17 %
Neutro Abs: 1.3 10*3/uL — ABNORMAL LOW (ref 1.7–7.7)
Neutrophils Relative %: 49 %
Platelet Count: 125 10*3/uL — ABNORMAL LOW (ref 150–400)
RBC: 3.57 MIL/uL — ABNORMAL LOW (ref 3.87–5.11)
RDW: 20.3 % — ABNORMAL HIGH (ref 11.5–15.5)
WBC Count: 2.7 10*3/uL — ABNORMAL LOW (ref 4.0–10.5)
nRBC: 0 % (ref 0.0–0.2)

## 2022-10-03 LAB — CMP (CANCER CENTER ONLY)
ALT: 11 U/L (ref 0–44)
AST: 22 U/L (ref 15–41)
Albumin: 3.5 g/dL (ref 3.5–5.0)
Alkaline Phosphatase: 74 U/L (ref 38–126)
Anion gap: 6 (ref 5–15)
BUN: 12 mg/dL (ref 8–23)
CO2: 27 mmol/L (ref 22–32)
Calcium: 9.4 mg/dL (ref 8.9–10.3)
Chloride: 104 mmol/L (ref 98–111)
Creatinine: 0.77 mg/dL (ref 0.44–1.00)
GFR, Estimated: >60 mL/min
Glucose, Bld: 89 mg/dL (ref 70–99)
Potassium: 4.2 mmol/L (ref 3.5–5.1)
Sodium: 137 mmol/L (ref 135–145)
Total Bilirubin: 0.5 mg/dL (ref 0.3–1.2)
Total Protein: 6.9 g/dL (ref 6.5–8.1)

## 2022-10-03 LAB — TOTAL PROTEIN, URINE DIPSTICK: Protein, ur: NEGATIVE mg/dL

## 2022-10-03 MED ORDER — OXALIPLATIN CHEMO INJECTION 100 MG/20ML
50.0000 mg/m2 | Freq: Once | INTRAVENOUS | Status: AC
  Administered 2022-10-03: 75 mg via INTRAVENOUS
  Filled 2022-10-03: qty 15

## 2022-10-03 MED ORDER — SODIUM CHLORIDE 0.9% FLUSH
10.0000 mL | INTRAVENOUS | Status: DC | PRN
  Administered 2022-10-03: 10 mL

## 2022-10-03 MED ORDER — PALONOSETRON HCL INJECTION 0.25 MG/5ML
0.2500 mg | Freq: Once | INTRAVENOUS | Status: AC
  Administered 2022-10-03: 0.25 mg via INTRAVENOUS
  Filled 2022-10-03: qty 5

## 2022-10-03 MED ORDER — SODIUM CHLORIDE 0.9% FLUSH
10.0000 mL | Freq: Once | INTRAVENOUS | Status: AC
  Administered 2022-10-03: 10 mL

## 2022-10-03 MED ORDER — DEXTROSE 5 % IV SOLN: Freq: Once | INTRAVENOUS | Status: AC

## 2022-10-03 MED ORDER — LEUCOVORIN CALCIUM INJECTION 350 MG
400.0000 mg/m2 | Freq: Once | INTRAVENOUS | Status: AC
  Administered 2022-10-03: 600 mg via INTRAVENOUS
  Filled 2022-10-03: qty 17.5

## 2022-10-03 MED ORDER — SODIUM CHLORIDE 0.9 % IV SOLN
1800.0000 mg/m2 | INTRAVENOUS | Status: DC
  Administered 2022-10-03: 2500 mg via INTRAVENOUS
  Filled 2022-10-03: qty 50

## 2022-10-03 MED ORDER — SODIUM CHLORIDE 0.9 % IV SOLN
5.0000 mg/kg | Freq: Once | INTRAVENOUS | Status: AC
  Administered 2022-10-03: 250 mg via INTRAVENOUS
  Filled 2022-10-03: qty 10

## 2022-10-03 MED ORDER — SODIUM CHLORIDE 0.9 % IV SOLN
10.0000 mg | Freq: Once | INTRAVENOUS | Status: AC
  Administered 2022-10-03: 10 mg via INTRAVENOUS
  Filled 2022-10-03: qty 10

## 2022-10-03 MED ORDER — SODIUM CHLORIDE 0.9 % IV SOLN: Freq: Once | INTRAVENOUS | Status: AC

## 2022-10-03 NOTE — Progress Notes (Signed)
Patient seen by Dr. Andrez Grime are within treatment parameters.  Labs reviewed: and are not all within treatment parameters. ANC 1.3  Per physician team, patient is ready for treatment and there are NO modifications to the treatment plan.

## 2022-10-03 NOTE — Patient Instructions (Signed)
[facility]  Discharge Instructions: Thank you for choosing [facility] to provide your oncology and hematology care.   If you have a lab appointment with the [facility], please go directly to the [facility] and check in at the registration area.   Wear comfortable clothing and clothing appropriate for easy access to any Portacath or PICC line.   We strive to give you quality time with your provider. You may need to reschedule your appointment if you arrive late (15 or more minutes).  Arriving late affects you and other patients whose appointments are after yours.  Also, if you miss three or more appointments without notifying the office, you may be dismissed from the clinic at the provider's discretion.      For prescription refill requests, have your pharmacy contact our office and allow 72 hours for refills to be completed.    Today you received the following chemotherapy and/or immunotherapy agents: Bevacizumab, Oxaliplatin, Leucovorin and Fluorouracil         To help prevent nausea and vomiting after your treatment, we encourage you to take your nausea medication as directed.  BELOW ARE SYMPTOMS THAT SHOULD BE REPORTED IMMEDIATELY: *FEVER GREATER THAN 100.4 F (38 C) OR HIGHER *CHILLS OR SWEATING *NAUSEA AND VOMITING THAT IS NOT CONTROLLED WITH YOUR NAUSEA MEDICATION *UNUSUAL SHORTNESS OF BREATH *UNUSUAL BRUISING OR BLEEDING *URINARY PROBLEMS (pain or burning when urinating, or frequent urination) *BOWEL PROBLEMS (unusual diarrhea, constipation, pain near the anus) TENDERNESS IN MOUTH AND THROAT WITH OR WITHOUT PRESENCE OF ULCERS (sore throat, sores in mouth, or a toothache) UNUSUAL RASH, SWELLING OR PAIN  UNUSUAL VAGINAL DISCHARGE OR ITCHING   Items with * indicate a potential emergency and should be followed up as soon as possible or go to the Emergency Department if any problems should occur.  Please show the  CHEMOTHERAPY ALERT CARD or IMMUNOTHERAPY ALERT CARD at check-in to the Emergency Department and triage nurse.  Should you have questions after your visit or need to cancel or reschedule your appointment, please contact [facility]  Dept: [phone]  and follow the prompts.  Office hours are 8:00 a.m. to 4:30 p.m. Monday - Friday. Please note that voicemails left after 4:00 p.m. may not be returned until the following business day.  We are closed weekends and major holidays. You have access to a nurse at all times for urgent questions. Please call the main number to the clinic Dept: [phone] and follow the prompts.   For any non-urgent questions, you may also contact your provider using MyChart. We now offer e-Visits for anyone 51 and older to request care online for non-urgent symptoms. For details visit [email or web address].   Also download the MyChart app! Go to the app store, search "MyChart", open the app, select [facility], and log in with your MyChart username and password.

## 2022-10-05 ENCOUNTER — Inpatient Hospital Stay: Payer: Federal, State, Local not specified - PPO

## 2022-10-05 VITALS — BP 125/65 | HR 83 | Temp 97.7°F | Resp 18

## 2022-10-05 DIAGNOSIS — Z5112 Encounter for antineoplastic immunotherapy: Secondary | ICD-10-CM | POA: Diagnosis not present

## 2022-10-05 DIAGNOSIS — C20 Malignant neoplasm of rectum: Secondary | ICD-10-CM

## 2022-10-05 MED ORDER — SODIUM CHLORIDE 0.9% FLUSH
10.0000 mL | INTRAVENOUS | Status: DC | PRN
  Administered 2022-10-05: 10 mL

## 2022-10-05 MED ORDER — HEPARIN SOD (PORK) LOCK FLUSH 100 UNIT/ML IV SOLN
500.0000 [IU] | Freq: Once | INTRAVENOUS | Status: AC | PRN
  Administered 2022-10-05: 500 [IU]

## 2022-10-05 MED ORDER — PEGFILGRASTIM-JMDB 6 MG/0.6ML ~~LOC~~ SOSY
6.0000 mg | PREFILLED_SYRINGE | Freq: Once | SUBCUTANEOUS | Status: AC
  Administered 2022-10-05: 6 mg via SUBCUTANEOUS

## 2022-10-07 ENCOUNTER — Ambulatory Visit (HOSPITAL_COMMUNITY)
Admission: RE | Admit: 2022-10-07 | Discharge: 2022-10-07 | Disposition: A | Discharge status: Home / Self Care | Payer: Federal, State, Local not specified - PPO | Source: Ambulatory Visit | Attending: Hematology | Admitting: Hematology

## 2022-10-07 DIAGNOSIS — C787 Secondary malignant neoplasm of liver and intrahepatic bile duct: Secondary | ICD-10-CM | POA: Diagnosis present

## 2022-10-07 DIAGNOSIS — C20 Malignant neoplasm of rectum: Secondary | ICD-10-CM | POA: Diagnosis present

## 2022-10-07 MED ORDER — IOHEXOL 300 MG/ML  SOLN
100.0000 mL | Freq: Once | INTRAMUSCULAR | Status: AC | PRN
  Administered 2022-10-07: 100 mL via INTRAVENOUS

## 2022-10-07 MED ORDER — IOHEXOL 9 MG/ML PO SOLN
1000.0000 mL | ORAL | Status: AC
  Administered 2022-10-07: 1000 mL via ORAL

## 2022-10-09 NOTE — Assessment & Plan Note (Signed)
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  -She began first-line chemo with dose-reduced FOLFIRI and Beva in 2/22.  -we changed to FOLFOX with continued Beva on 03/28/22. She tolerated well overall with some fatigue and cold sensitivity. -I personally reviewed her restaging CT from 10/07/2022 which showed overall SD in liver and rectum, stable and indermininate lung nodules, no other new lesions  -She is tolerating chemotherapy overall well, will continue.

## 2022-10-09 NOTE — Assessment & Plan Note (Signed)
-  Patient understands her cancer is not curable, and the goal of therapy is palliative -She has agreed with DNR

## 2022-10-09 NOTE — Assessment & Plan Note (Signed)
diagnosed in 12/2015. S/p right hemicolectomy with right salpingo oophorectomy and adjuvant ChemoRT. 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 

## 2022-10-10 ENCOUNTER — Inpatient Hospital Stay (HOSPITAL_BASED_OUTPATIENT_CLINIC_OR_DEPARTMENT_OTHER): Payer: Federal, State, Local not specified - PPO | Admitting: Hematology

## 2022-10-10 ENCOUNTER — Encounter: Payer: Self-pay | Admitting: Hematology

## 2022-10-10 DIAGNOSIS — C20 Malignant neoplasm of rectum: Secondary | ICD-10-CM

## 2022-10-10 DIAGNOSIS — C182 Malignant neoplasm of ascending colon: Secondary | ICD-10-CM

## 2022-10-10 DIAGNOSIS — C787 Secondary malignant neoplasm of liver and intrahepatic bile duct: Secondary | ICD-10-CM

## 2022-10-10 DIAGNOSIS — Z66 Do not resuscitate: Secondary | ICD-10-CM | POA: Diagnosis not present

## 2022-10-10 NOTE — Progress Notes (Signed)
[facility]   [phone] [phone]   Clinic Follow up Note   Patient Care Team: Patient, No Pcp Per as PCP - General (General Practice) [name], NP as Nurse Practitioner (Oncology) [name], MD as Consulting Physician (Oncology) [name], RN (Inactive) as Oncology Nurse Navigator  Date of Service:  10/10/2022  I connected with Brittney Spencer on 10/10/2022 at  8:20 AM EDT by telephone visit and verified that I am speaking with the correct person using two identifiers.  I discussed the limitations, risks, security and privacy concerns of performing an evaluation and management service by telephone and the availability of in person appointments. I also discussed with the patient that there may be a patient responsible charge related to this service. The patient expressed understanding and agreed to proceed.   Other persons participating in the visit and their role in the encounter:  No  Patient's location:  Home Provider's location:  Office  CHIEF COMPLAINT: f/u of metastatic rectal cancer, h/o colon cancer        CURRENT THERAPY:    FOLFOX with bevacizumab, q14d, starting 03/28/22      ASSESSMENT & PLAN:  Brittney Spencer is a 72 y.o. 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  -She began first-line chemo with dose-reduced FOLFIRI and Beva in 2/22.  -we changed to FOLFOX with continued Beva on 03/28/22. She tolerated well overall with some fatigue and cold sensitivity. -I personally reviewed her restaging CT from 10/07/2022 which showed overall SD in liver and rectum, stable and indermininate lung nodules, no other new lesions  -She is tolerating chemotherapy overall well, will continue. -I personally reviewed her restaging CT scan from October 07, 2022, which  showed overall stable disease.  She has multiple small lung nodules, indeterminate, including one 7 mm nodule, will continue monitoring. -Recommend continue current therapy, she is tolerating well.  She agrees.  Cancer of ascending colon (HCC) diagnosed in 12/2015. S/p right hemicolectomy with right salpingo oophorectomy and adjuvant ChemoRT. 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  DNR (do not resuscitate) -Patient understands her cancer is not curable, and the goal of therapy is palliative -She has agreed with DNR  PLAN: -discuss CT scan w/ pt, SD -Will continue current therapy, she will return next week for treatment. -I will see her back in 3 weeks before chemo.   SUMMARY OF ONCOLOGIC HISTORY: Oncology History Overview Note  Cancer Staging Cancer of ascending colon [facility]) Staging form: Colon and Rectum, AJCC 7th Edition - Clinical stage from 02/07/2016: Stage IIIC (T4b, N1b, M0) - Signed by [name], MD on 03/04/2016 Laterality: Right Residual tumor (R): R2 - Macroscopic    Cancer of ascending colon  10/25/2015 Imaging   CT ABD/PELVIS:  Inflammatory changes inferior to the cecal tip appear improved, there is still irregular soft tissue thickening of the cecal tip, and there are adjacent prominent lymph nodes in the ileocolonic mesentery, measuring 13 mm on image 49 and 8 mm on image 52. In addition, there is a 2.5 x 1.8 cm nodule on image 46 which has central low density. Therefore, these findings  are moderately suspicious for an underlying cecal malignancy with perforation.    01/19/2016 Procedure   COLONOSCOPY per Dr. [name]: Fungating, ulcerated mass almost obstructing mid ascending colon   01/19/2016 Initial Biopsy   Diagnosis Surgical [P], cecal mass - INVASIVE ADENOCARCINOMA WITH  ULCERATION. - SEE COMMENT.   02/05/2016 Tumor Marker   Patient's tumor was tested for the following markers: CEA Results of the tumor marker test revealed 5.7.   02/07/2016 Initial Diagnosis   Cancer of ascending colon (HCC)   02/07/2016 Definitive Surgery   Laparoscopic assisted right hemicolectomy and right salpingo oopherectomy--[name]   02/07/2016 Pathologic Stage   p T4 N1b   2/43 nodes +   02/07/2016 Pathology Results   MMR normal; G2 adenocarcinoma;proximal & distal margins negative; soft tissue mass on pelvic sidewall + for adenocarcinoma with positive margin MSI Stable   03/08/2016 Imaging   CT chest negative for metastasis.    03/19/2016 - 04/25/2016 Radiation Therapy   Adjuvant irradiation, 50 gray in 28 fractions   03/19/2016 - 04/22/2016 Chemotherapy   Xeloda 1500 mg twice daily, started on 03/19/2016, dose reduced to 1000 mg twice daily from week 3 due to neutropenia, and patient stopped 3 days before last dose radiation due to difficulty swallowing the pill    05/20/2016 -  Adjuvant Chemotherapy   Patient declined adjuvant chemotherapy   09/16/2016 Imaging   CT CAP w Contrast 1. No evidence of local tumor recurrence at the ileocolic anastomosis. 2. No findings suspicious for metastatic disease in the chest, abdomen or pelvis. 3. Nonspecific trace free fluid in the pelvic cul-de-sac. 4. Stable solitary 3 mm right upper lobe pulmonary nodule, for which 6 month stability has been demonstrated, probably benign. 5. Additional findings include stable right posterior pericardial cyst and small calcified uterine fibroids.   05/13/2017 Imaging   CT CAP W Contrast 05/13/17 IMPRESSION: 1. No current findings of residual or recurrent malignancy. 2. Mild prominence of stool throughout the colon. Nondistended portions of the rectum. 3. Several tiny pulmonary nodules are stable from the earliest available comparison of 03/08/2016 and probably benign, but may merit  surveillance. 4. Other imaging findings of potential clinical significance: Old granulomatous disease. Aortoiliac atherosclerotic vascular disease. Lumbar spondylosis and degenerative disc disease. Stable amount of trace free pelvic fluid.   04/27/2018 Imaging   04/27/2018 CT CAP IMPRESSION: Stable exam. No evidence of recurrent or metastatic carcinoma within the chest, abdomen, or pelvis   04/19/2019 Imaging   CT CAP W Contrast  IMPRESSION: Chest Impression:   1. No evidence of thoracic metastasis. 2. Stable small bilateral pulmonary nodules.   Abdomen / Pelvis Impression:   1. No evidence local colorectal carcinoma recurrence or metastasis in the abdomen pelvis. 2. Post RIGHT hemicolectomy.   01/22/2021 Imaging   CT CAP  IMPRESSION: CT CHEST IMPRESSION   1. Similar nonspecific pulmonary nodules. 2. New posterior left upper lobe reticulonodular opacity, suspicious for interval mild infection or inflammation. 3. No thoracic adenopathy.   CT ABDOMEN AND PELVIS IMPRESSION   1. Further decrease in size of high left hepatic lobe 3 mm low-density lesion. No new or progressive metastatic disease within the abdomen or pelvis. 2. Similar trace free pelvic fluid. 3. Similar nonspecific mid rectal wall thickening. 4.  Aortic Atherosclerosis (ICD10-I70.0).   04/23/2021 Imaging   CT CAP  IMPRESSION: 1. Treated metastatic lesion between segments 2 and 3 of the liver, slightly smaller and less distinct than prior examination. No other signs of definite metastatic disease elsewhere  in the abdomen or pelvis. 2. Multiple small pulmonary nodules, stable compared to the prior examination, favored to be benign. No definitive findings to suggest metastatic disease to the thorax. 3. Aortic atherosclerosis. 4. Additional incidental findings, as above.   08/13/2021 Imaging   EXAM: CT CHEST, ABDOMEN, AND PELVIS WITH CONTRAST  IMPRESSION: 1. A previously seen PET avid lesion of  the anterior left lobe of the liver, hepatic segment II, is no longer discretely appreciable consistent with treatment response of a hepatic metastasis. 2. No evidence of new metastatic disease in the chest, abdomen, or pelvis. 3. Interval increase in a small focus of consolidation and nodularity of the medial left upper lobe, consistent with minimal, ongoing atypical infection. Additional tiny bilateral pulmonary nodules are stable and almost certainly incidental benign. Attention on follow-up. 4. Status post right hemicolectomy and ileocolic anastomosis.   07/02/2022 Imaging    IMPRESSION: CHEST IMPRESSION:   1. No evidence of thoracic metastasis. 2. Stable small pulmonary nodules.   PELVIS IMPRESSION:   1. Stable to slight decrease in size of subcapsular lesion in the RIGHT hepatic lobe. 2. No evidence of new or progressive disease in the abdomen pelvis. 3.  Aortic Atherosclerosis (ICD10-I70.0).     Rectal cancer metastasized to liver  06/15/2020 Procedure   Screening Colonoscopy by Dr [name]  IMPRESSION - Decreased sphincter tone and internal hemorrhoids that prolapse with straining, but require manual replacement into the anal canal (Grade III) found on digital rectal exam. - Patent side-to-side ileo-colonic anastomosis, characterized by healthy appearing mucosa. - The examined portion of the ileum was normal. - One diminutive polyp in the proximal transverse colon, removed with a cold biopsy forceps. Resected and retrieved. - Likely malignant partially obstructing tumor in the mid rectum. Biopsied. Tattooed. - The examination was otherwise normal on direct and retroflexion views.   06/15/2020 Initial Biopsy   Diagnosis 1. Transverse Colon Polyp - HYPERPLASTIC POLYP 2. Rectum, biopsy - ADENOCARCINOMA ARISING IN A TUBULAR ADENOMA WITH HIGH-GRADE DYSPLASIA. SEE NOTE Diagnosis Note 2. Dr. Luisa Hart reviewed the case and concurs with the diagnosis. Dr. [name] was notified on  06/16/2020.   06/28/2020 Imaging   CT CAP  IMPRESSION: 1. New low-density focus in the anterior aspect of the lateral segment LEFT hepatic lobe measuring 1.2 x 1.0 cm, compatible with small metastatic lesion in the LEFT hepatic lobe. 2. Soft tissue in the RIGHT iliac fossa following RIGHT hemicolectomy invades the psoas musculature and is slowly enlarging over time, more linear on the prior study now highly concerning for recurrence/metastasis to this location. 3. Signs of enteritis, potentially post radiation changes of the small bowel. Tethered small bowel in the RIGHT lower quadrant shows focal thickening and narrowing suspicious for small bowel involvement and developing partial obstruction though currently contrast passes beyond this point into the colon. 4. Rectal thickening in this patient with known rectal mass as described. 5. No evidence of metastatic disease in the chest. 6. Stable small pulmonary nodules. 7.  and aortic atherosclerosis.   Aortic Atherosclerosis (ICD10-I70.0) and Emphysema (ICD10-J43.9).   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 Exam positive for FDG avid rectal tumor which corresponds to the recent colonoscopy findings. 2. FDG avid soft tissue mass within the right iliac fossa is noted and consistent with local tumor recurrence from previous ascending colon tumor. 3. Lateral segment left lobe of liver lesion is FDG avid concerning for liver metastasis. 4. No specific  findings identified to suggest metastatic disease to the chest.   08/10/2020 -  Chemotherapy   First-line FOLFIRI q2weeks starting 08/10/20. dose reduced with cycle 1. Irinotecan/5FU increased and Bevacizumab added with cycle 2 on 08/23/2020    08/17/2020 - 03/09/2022 Chemotherapy   Patient is on Treatment Plan : COLORECTAL FOLFIRI + Bevacizumab q14d     10/27/2020 Imaging   CT CAP  IMPRESSION: 1. Interval decrease  in size of the hypermetabolic left hepatic lesion, consistent with metastatic disease. No new liver lesion evident. 2. Interval resolution of the hypermetabolic soft tissue lesion along the right iliac fossa with no measurable soft tissue lesion remaining at this location today. 3. Similar appearance of soft tissue fullness in the rectum at the site of the hypermetabolic lesion seen previously. 4. Stable tiny bilateral pulmonary nodules. Continued attention on follow-up recommended. 5. Small volume free fluid in the pelvis. 6. Aortic Atherosclerosis (ICD10-I70.0).   01/22/2021 Imaging   CT CAP  IMPRESSION: CT CHEST IMPRESSION   1. Similar nonspecific pulmonary nodules. 2. New posterior left upper lobe reticulonodular opacity, suspicious for interval mild infection or inflammation. 3. No thoracic adenopathy.   CT ABDOMEN AND PELVIS IMPRESSION   1. Further decrease in size of high left hepatic lobe 3 mm low-density lesion. No new or progressive metastatic disease within the abdomen or pelvis. 2. Similar trace free pelvic fluid. 3. Similar nonspecific mid rectal wall thickening. 4.  Aortic Atherosclerosis (ICD10-I70.0).   04/23/2021 Imaging   CT CAP  IMPRESSION: 1. Treated metastatic lesion between segments 2 and 3 of the liver, slightly smaller and less distinct than prior examination. No other signs of definite metastatic disease elsewhere in the abdomen or pelvis. 2. Multiple small pulmonary nodules, stable compared to the prior examination, favored to be benign. No definitive findings to suggest metastatic disease to the thorax. 3. Aortic atherosclerosis. 4. Additional incidental findings, as above.   08/13/2021 Imaging   EXAM: CT CHEST, ABDOMEN, AND PELVIS WITH CONTRAST  IMPRESSION: 1. A previously seen PET avid lesion of the anterior left lobe of the liver, hepatic segment II, is no longer discretely appreciable consistent with treatment response of a hepatic  metastasis. 2. No evidence of new metastatic disease in the chest, abdomen, or pelvis. 3. Interval increase in a small focus of consolidation and nodularity of the medial left upper lobe, consistent with minimal, ongoing atypical infection. Additional tiny bilateral pulmonary nodules are stable and almost certainly incidental benign. Attention on follow-up. 4. Status post right hemicolectomy and ileocolic anastomosis.   03/28/2022 -  Chemotherapy   Patient is on Treatment Plan : COLORECTAL FOLFOX + Bevacizumab q14d     07/02/2022 Imaging    IMPRESSION: CHEST IMPRESSION:   1. No evidence of thoracic metastasis. 2. Stable small pulmonary nodules.   PELVIS IMPRESSION:   1. Stable to slight decrease in size of subcapsular lesion in the RIGHT hepatic lobe. 2. No evidence of new or progressive disease in the abdomen pelvis.   10/07/2022 Imaging    IMPRESSION: 1. Stable hypovascular mass in the periphery of the right lobe of the liver, which likely represents a treated metastatic lesion. Stable subcentimeter lesion just medial to this is also similar to the recent prior examination. No new hepatic lesions are otherwise noted. 2. Persistent but stable poorly defined soft tissue thickening in the right lower quadrant associated with multiple small bowel loops and the overlying iliopsoas musculature, which corresponds to focal hypermetabolism on remote prior PET-CT. Given the stability, this likely represents  a treated metastatic lesion. Continued attention on follow-up studies is recommended. 3. Persistent mass-like thickening in the proximal rectum corresponding to previously noted hypermetabolic rectal neoplasm on prior PET-CT. This currently measures approximately 2.8 x 2.3 cm and is slightly more apparent than the most recent prior study. 4. Multiple small pulmonary nodules generally stable compared to the prior study, with exception of a new branching nodule in the anterior  aspect of the left upper lobe measuring 7 x 3 mm (mean diameter 5 mm). This is nonspecific. Close attention on follow-up studies is recommended to ensure stability. 5. Aortic atherosclerosis. 6. Additional incidental findings, as above.        INTERVAL HISTORY:  Brittney Spencer was contacted for a follow up of metastatic rectal cancer, h/o colon cancer .She was last seen by me on 10/03/2022. Pt state that last treatment went well.   All other systems were reviewed with the patient and are negative.  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 Some residual intraretinal fluid remains, very small localized subfoveal of fluid remains although this continues to slowly resorb. We'll continue to observe.   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  ALLERGIES:  is allergic to venofer [iron sucrose], fish allergy, peanut-containing drug products, soy allergy, and buspirone.  MEDICATIONS:  Current Outpatient Medications  Medication Sig Dispense Refill   ALPRAZolam (XANAX) 0.25 MG tablet Take 1 tablet (0.25 mg total) by mouth daily as needed for anxiety. 30 tablet 0   Cholecalciferol (VITAMIN D3 PO) Take by mouth daily.     docusate sodium (COLACE) 100 MG capsule 1 capsule as needed     lidocaine-prilocaine (EMLA) cream Apply 1 Application topically as needed. 30 g 1   NORVASC 2.5 MG tablet Take 1 tablet (2.5 mg total) by mouth daily. 90 tablet 1   ondansetron (ZOFRAN) 8 MG tablet Take 1 tablet (8 mg total) by mouth every 8 (eight) hours as needed for nausea or vomiting. 20 tablet 2   prochlorperazine (COMPAZINE) 10 MG tablet Take 1 tablet (10 mg total) by mouth every 6 (six) hours as needed for nausea or vomiting. 30 tablet 2   Current Facility-Administered Medications  Medication Dose Route Frequency Provider Last Rate  Last Admin   0.9 %  sodium chloride infusion  500 mL Intravenous Once Charlie Pitter III, MD        PHYSICAL EXAMINATION: ECOG PERFORMANCE STATUS: 1 - Symptomatic but completely ambulatory  There were no vitals filed for this visit. Wt Readings from Last 3 Encounters:  10/03/22 100 lb 6.4 oz (45.5 kg)  09/12/22 102 lb 8 oz (46.5 kg)  08/29/22 102 lb 9.6 oz (46.5 kg)     No vitals taken today, Exam not performed today  LABORATORY DATA:  I have reviewed the data as listed    Latest Ref Rng & Units 10/03/2022    8:16 AM 09/12/2022    8:13 AM 08/29/2022    8:59 AM  CBC  WBC 4.0 - 10.5 K/uL 2.7  5.9  5.5   Hemoglobin 12.0 - 15.0 g/dL 16.1  9.2  8.8   Hematocrit 36.0 - 46.0 % 32.1  29.7  28.8   Platelets 150 - 400 K/uL 125  95  84         Latest Ref Rng & Units 10/03/2022    8:16 AM 09/12/2022    8:13 AM 08/29/2022    8:59 AM  CMP  Glucose 70 - 99 mg/dL 89  98  94   BUN 8 - 23 mg/dL 12  8  11     Creatinine 0.44 - 1.00 mg/dL 0.96  0.45  4.09   Sodium 135 - 145 mmol/L 137  140  137   Potassium 3.5 - 5.1 mmol/L 4.2  3.7  3.9   Chloride 98 - 111 mmol/L 104  109  106   CO2 22 - 32 mmol/L 27  26  26    Calcium 8.9 - 10.3 mg/dL 9.4  8.4  8.2   Total Protein 6.5 - 8.1 g/dL 6.9  6.5  6.0   Total Bilirubin 0.3 - 1.2 mg/dL 0.5  0.3  0.2   Alkaline Phos 38 - 126 U/L 74  83  84   AST 15 - 41 U/L 22  21  18    ALT 0 - 44 U/L 11  10  8        RADIOGRAPHIC STUDIES: I have personally reviewed the radiological images as listed and agreed with the findings in the report. No results found.    No orders of the defined types were placed in this encounter.  All questions were answered. The patient knows to call the clinic with any problems, questions or concerns. No barriers to learning was detected. The total time spent in the appointment was 15 minutes.     [name], MD 10/10/2022   Carolin Coy am acting as scribe for [name], MD.   I have reviewed the above documentation for accuracy and completeness, and I agree with the above.

## 2022-10-11 ENCOUNTER — Telehealth: Payer: Self-pay | Admitting: Hematology

## 2022-10-11 ENCOUNTER — Other Ambulatory Visit: Payer: Self-pay

## 2022-10-11 NOTE — Telephone Encounter (Signed)
Called patient back to confirm she is still receiving tx on 4/18 just no office visit. Left voicemail with information.

## 2022-10-16 ENCOUNTER — Other Ambulatory Visit: Payer: Self-pay | Admitting: Nurse Practitioner

## 2022-10-16 DIAGNOSIS — C182 Malignant neoplasm of ascending colon: Secondary | ICD-10-CM

## 2022-10-16 MED FILL — Dexamethasone Sodium Phosphate Inj 100 MG/10ML: INTRAMUSCULAR | Qty: 1 | Status: AC

## 2022-10-17 ENCOUNTER — Other Ambulatory Visit: Payer: Self-pay

## 2022-10-17 ENCOUNTER — Ambulatory Visit: Payer: Federal, State, Local not specified - PPO | Admitting: Hematology

## 2022-10-17 ENCOUNTER — Inpatient Hospital Stay: Payer: Federal, State, Local not specified - PPO

## 2022-10-17 VITALS — BP 136/90 | HR 95 | Temp 98.0°F | Resp 18 | Wt 102.1 lb

## 2022-10-17 DIAGNOSIS — Z5112 Encounter for antineoplastic immunotherapy: Secondary | ICD-10-CM | POA: Diagnosis not present

## 2022-10-17 DIAGNOSIS — Z95828 Presence of other vascular implants and grafts: Secondary | ICD-10-CM

## 2022-10-17 DIAGNOSIS — D500 Iron deficiency anemia secondary to blood loss (chronic): Secondary | ICD-10-CM

## 2022-10-17 DIAGNOSIS — C20 Malignant neoplasm of rectum: Secondary | ICD-10-CM

## 2022-10-17 LAB — CMP (CANCER CENTER ONLY)
ALT: 9 U/L (ref 0–44)
AST: 19 U/L (ref 15–41)
Albumin: 3.4 g/dL — ABNORMAL LOW (ref 3.5–5.0)
Alkaline Phosphatase: 84 U/L (ref 38–126)
Anion gap: 3 — ABNORMAL LOW (ref 5–15)
BUN: 10 mg/dL (ref 8–23)
CO2: 27 mmol/L (ref 22–32)
Calcium: 8.7 mg/dL — ABNORMAL LOW (ref 8.9–10.3)
Chloride: 107 mmol/L (ref 98–111)
Creatinine: 0.69 mg/dL (ref 0.44–1.00)
GFR, Estimated: >60 mL/min (ref >60)
Glucose, Bld: 100 mg/dL — ABNORMAL HIGH (ref 70–99)
Potassium: 4.1 mmol/L (ref 3.5–5.1)
Sodium: 137 mmol/L (ref 135–145)
Total Bilirubin: 0.3 mg/dL (ref 0.3–1.2)
Total Protein: 6.7 g/dL (ref 6.5–8.1)

## 2022-10-17 LAB — CBC WITH DIFFERENTIAL (CANCER CENTER ONLY)
Abs Immature Granulocytes: 0.03 10*3/uL (ref 0.00–0.07)
Basophils Absolute: 0 10*3/uL (ref 0.0–0.1)
Basophils Relative: 0 %
Eosinophils Absolute: 0.1 10*3/uL (ref 0.0–0.5)
Eosinophils Relative: 2 %
HCT: 29 % — ABNORMAL LOW (ref 36.0–46.0)
Hemoglobin: 9.3 g/dL — ABNORMAL LOW (ref 12.0–15.0)
Immature Granulocytes: 1 %
Lymphocytes Relative: 18 %
Lymphs Abs: 0.9 10*3/uL (ref 0.7–4.0)
MCH: 29.1 pg (ref 26.0–34.0)
MCHC: 32.1 g/dL (ref 30.0–36.0)
MCV: 90.6 fL (ref 80.0–100.0)
Monocytes Absolute: 0.5 10*3/uL (ref 0.1–1.0)
Monocytes Relative: 11 %
Neutro Abs: 3.3 10*3/uL (ref 1.7–7.7)
Neutrophils Relative %: 68 %
Platelet Count: 76 10*3/uL — ABNORMAL LOW (ref 150–400)
RBC: 3.2 MIL/uL — ABNORMAL LOW (ref 3.87–5.11)
RDW: 19 % — ABNORMAL HIGH (ref 11.5–15.5)
WBC Count: 4.8 10*3/uL (ref 4.0–10.5)
nRBC: 0 % (ref 0.0–0.2)

## 2022-10-17 LAB — FERRITIN: Ferritin: 217 ng/mL (ref 11–307)

## 2022-10-17 LAB — TOTAL PROTEIN, URINE DIPSTICK: Protein, ur: NEGATIVE mg/dL

## 2022-10-17 LAB — CEA (IN HOUSE-CHCC): CEA (CHCC-In House): 21.46 ng/mL — ABNORMAL HIGH (ref 0.00–5.00)

## 2022-10-17 MED ORDER — SODIUM CHLORIDE 0.9% FLUSH
10.0000 mL | Freq: Once | INTRAVENOUS | Status: AC
  Administered 2022-10-17: 10 mL

## 2022-10-17 MED ORDER — SODIUM CHLORIDE 0.9 % IV SOLN: Freq: Once | INTRAVENOUS | Status: AC

## 2022-10-17 MED ORDER — LEUCOVORIN CALCIUM INJECTION 350 MG
400.0000 mg/m2 | Freq: Once | INTRAVENOUS | Status: AC
  Administered 2022-10-17: 600 mg via INTRAVENOUS
  Filled 2022-10-17: qty 30

## 2022-10-17 MED ORDER — OXALIPLATIN CHEMO INJECTION 100 MG/20ML
50.0000 mg/m2 | Freq: Once | INTRAVENOUS | Status: AC
  Administered 2022-10-17: 75 mg via INTRAVENOUS
  Filled 2022-10-17: qty 15

## 2022-10-17 MED ORDER — SODIUM CHLORIDE 0.9 % IV SOLN
1800.0000 mg/m2 | INTRAVENOUS | Status: DC
  Administered 2022-10-17: 2500 mg via INTRAVENOUS
  Filled 2022-10-17: qty 50

## 2022-10-17 MED ORDER — DEXTROSE 5 % IV SOLN: Freq: Once | INTRAVENOUS | Status: AC

## 2022-10-17 MED ORDER — HEPARIN SOD (PORK) LOCK FLUSH 100 UNIT/ML IV SOLN: 500.0000 [IU] | Freq: Once | INTRAVENOUS | Status: DC | PRN

## 2022-10-17 MED ORDER — DEXTROSE 5 % IV SOLN: Freq: Once | INTRAVENOUS | Status: DC

## 2022-10-17 MED ORDER — SODIUM CHLORIDE 0.9 % IV SOLN
10.0000 mg | Freq: Once | INTRAVENOUS | Status: AC
  Administered 2022-10-17: 10 mg via INTRAVENOUS
  Filled 2022-10-17: qty 10

## 2022-10-17 MED ORDER — PALONOSETRON HCL INJECTION 0.25 MG/5ML
0.2500 mg | Freq: Once | INTRAVENOUS | Status: AC
  Administered 2022-10-17: 0.25 mg via INTRAVENOUS
  Filled 2022-10-17: qty 5

## 2022-10-17 MED ORDER — SODIUM CHLORIDE 0.9% FLUSH: 10.0000 mL | INTRAVENOUS | Status: DC | PRN

## 2022-10-17 MED ORDER — SODIUM CHLORIDE 0.9 % IV SOLN
5.0000 mg/kg | Freq: Once | INTRAVENOUS | Status: AC
  Administered 2022-10-17: 250 mg via INTRAVENOUS
  Filled 2022-10-17: qty 10

## 2022-10-17 NOTE — Patient Instructions (Signed)
[facility]  Discharge Instructions: Thank you for choosing [facility] to provide your oncology and hematology care.   If you have a lab appointment with the Cancer Center, please go directly to the Cancer Center and check in at the registration area.   Wear comfortable clothing and clothing appropriate for easy access to any Portacath or PICC line.   We strive to give you quality time with your provider. You may need to reschedule your appointment if you arrive late (15 or more minutes).  Arriving late affects you and other patients whose appointments are after yours.  Also, if you miss three or more appointments without notifying the office, you may be dismissed from the clinic at the provider's discretion.      For prescription refill requests, have your pharmacy contact our office and allow 72 hours for refills to be completed.    Today you received the following chemotherapy and/or immunotherapy agents: Zirabev, Oxaliplatin, Leucovorin, Fluorouracil.       To help prevent nausea and vomiting after your treatment, we encourage you to take your nausea medication as directed.  BELOW ARE SYMPTOMS THAT SHOULD BE REPORTED IMMEDIATELY: *FEVER GREATER THAN 100.4 F (38 C) OR HIGHER *CHILLS OR SWEATING *NAUSEA AND VOMITING THAT IS NOT CONTROLLED WITH YOUR NAUSEA MEDICATION *UNUSUAL SHORTNESS OF BREATH *UNUSUAL BRUISING OR BLEEDING *URINARY PROBLEMS (pain or burning when urinating, or frequent urination) *BOWEL PROBLEMS (unusual diarrhea, constipation, pain near the anus) TENDERNESS IN MOUTH AND THROAT WITH OR WITHOUT PRESENCE OF ULCERS (sore throat, sores in mouth, or a toothache) UNUSUAL RASH, SWELLING OR PAIN  UNUSUAL VAGINAL DISCHARGE OR ITCHING   Items with * indicate a potential emergency and should be followed up as soon as possible or go to the Emergency Department if any problems should occur.  Please show the CHEMOTHERAPY ALERT  CARD or IMMUNOTHERAPY ALERT CARD at check-in to the Emergency Department and triage nurse.  Should you have questions after your visit or need to cancel or reschedule your appointment, please contact [facility]  Dept: [phone]  and follow the prompts.  Office hours are 8:00 a.m. to 4:30 p.m. Monday - Friday. Please note that voicemails left after 4:00 p.m. may not be returned until the following business day.  We are closed weekends and major holidays. You have access to a nurse at all times for urgent questions. Please call the main number to the clinic Dept: [phone] and follow the prompts.   For any non-urgent questions, you may also contact your provider using MyChart. We now offer e-Visits for anyone 18 and older to request care online for non-urgent symptoms. For details visit [email or web address].   Also download the MyChart app! Go to the app store, search "MyChart", open the app, select [facility], and log in with your MyChart username and password.   

## 2022-10-17 NOTE — Progress Notes (Signed)
Per Mosetta Putt MD, ok to treat with PLT 76 today.

## 2022-10-19 ENCOUNTER — Inpatient Hospital Stay: Payer: Federal, State, Local not specified - PPO

## 2022-10-19 VITALS — BP 127/82 | HR 96 | Temp 98.8°F | Resp 17

## 2022-10-19 DIAGNOSIS — Z5112 Encounter for antineoplastic immunotherapy: Secondary | ICD-10-CM | POA: Diagnosis not present

## 2022-10-19 DIAGNOSIS — C20 Malignant neoplasm of rectum: Secondary | ICD-10-CM

## 2022-10-19 MED ORDER — HEPARIN SOD (PORK) LOCK FLUSH 100 UNIT/ML IV SOLN
500.0000 [IU] | Freq: Once | INTRAVENOUS | Status: AC | PRN
  Administered 2022-10-19: 500 [IU]

## 2022-10-19 MED ORDER — PEGFILGRASTIM-JMDB 6 MG/0.6ML ~~LOC~~ SOSY
6.0000 mg | PREFILLED_SYRINGE | Freq: Once | SUBCUTANEOUS | Status: AC
  Administered 2022-10-19: 6 mg via SUBCUTANEOUS

## 2022-10-19 MED ORDER — SODIUM CHLORIDE 0.9% FLUSH
10.0000 mL | INTRAVENOUS | Status: DC | PRN
  Administered 2022-10-19: 10 mL

## 2022-10-19 NOTE — Patient Instructions (Signed)

## 2022-10-30 MED FILL — Dexamethasone Sodium Phosphate Inj 100 MG/10ML: INTRAMUSCULAR | Qty: 1 | Status: AC

## 2022-10-31 ENCOUNTER — Inpatient Hospital Stay: Payer: Federal, State, Local not specified - PPO | Attending: Nurse Practitioner

## 2022-10-31 ENCOUNTER — Inpatient Hospital Stay (HOSPITAL_BASED_OUTPATIENT_CLINIC_OR_DEPARTMENT_OTHER): Payer: Federal, State, Local not specified - PPO | Admitting: Hematology

## 2022-10-31 ENCOUNTER — Other Ambulatory Visit: Payer: Self-pay

## 2022-10-31 ENCOUNTER — Other Ambulatory Visit (HOSPITAL_COMMUNITY): Payer: Self-pay

## 2022-10-31 ENCOUNTER — Telehealth: Payer: Self-pay | Admitting: Pharmacist

## 2022-10-31 ENCOUNTER — Inpatient Hospital Stay: Payer: Federal, State, Local not specified - PPO

## 2022-10-31 ENCOUNTER — Telehealth: Payer: Self-pay | Admitting: Pharmacy Technician

## 2022-10-31 ENCOUNTER — Encounter: Payer: Self-pay | Admitting: Hematology

## 2022-10-31 VITALS — BP 139/73 | HR 77 | Resp 16

## 2022-10-31 VITALS — BP 158/84 | HR 79 | Temp 97.8°F | Resp 16 | Ht 66.0 in | Wt 105.5 lb

## 2022-10-31 DIAGNOSIS — Z923 Personal history of irradiation: Secondary | ICD-10-CM | POA: Insufficient documentation

## 2022-10-31 DIAGNOSIS — C20 Malignant neoplasm of rectum: Secondary | ICD-10-CM | POA: Diagnosis present

## 2022-10-31 DIAGNOSIS — C787 Secondary malignant neoplasm of liver and intrahepatic bile duct: Secondary | ICD-10-CM

## 2022-10-31 DIAGNOSIS — Z66 Do not resuscitate: Secondary | ICD-10-CM | POA: Insufficient documentation

## 2022-10-31 DIAGNOSIS — Z5111 Encounter for antineoplastic chemotherapy: Secondary | ICD-10-CM | POA: Diagnosis present

## 2022-10-31 DIAGNOSIS — Z1509 Genetic susceptibility to other malignant neoplasm: Secondary | ICD-10-CM | POA: Diagnosis not present

## 2022-10-31 DIAGNOSIS — Z9049 Acquired absence of other specified parts of digestive tract: Secondary | ICD-10-CM | POA: Diagnosis not present

## 2022-10-31 DIAGNOSIS — C182 Malignant neoplasm of ascending colon: Secondary | ICD-10-CM | POA: Diagnosis not present

## 2022-10-31 DIAGNOSIS — T451X5D Adverse effect of antineoplastic and immunosuppressive drugs, subsequent encounter: Secondary | ICD-10-CM | POA: Insufficient documentation

## 2022-10-31 DIAGNOSIS — G620 Drug-induced polyneuropathy: Secondary | ICD-10-CM | POA: Insufficient documentation

## 2022-10-31 DIAGNOSIS — Z9221 Personal history of antineoplastic chemotherapy: Secondary | ICD-10-CM | POA: Diagnosis not present

## 2022-10-31 DIAGNOSIS — Z5112 Encounter for antineoplastic immunotherapy: Secondary | ICD-10-CM | POA: Insufficient documentation

## 2022-10-31 DIAGNOSIS — D500 Iron deficiency anemia secondary to blood loss (chronic): Secondary | ICD-10-CM | POA: Insufficient documentation

## 2022-10-31 DIAGNOSIS — Z5189 Encounter for other specified aftercare: Secondary | ICD-10-CM | POA: Diagnosis not present

## 2022-10-31 LAB — CBC WITH DIFFERENTIAL (CANCER CENTER ONLY)
Abs Immature Granulocytes: 0.05 10*3/uL (ref 0.00–0.07)
Basophils Absolute: 0 10*3/uL (ref 0.0–0.1)
Basophils Relative: 0 %
Eosinophils Absolute: 0.1 10*3/uL (ref 0.0–0.5)
Eosinophils Relative: 1 %
HCT: 30.6 % — ABNORMAL LOW (ref 36.0–46.0)
Hemoglobin: 9.5 g/dL — ABNORMAL LOW (ref 12.0–15.0)
Immature Granulocytes: 1 %
Lymphocytes Relative: 19 %
Lymphs Abs: 1.1 10*3/uL (ref 0.7–4.0)
MCH: 28.9 pg (ref 26.0–34.0)
MCHC: 31 g/dL (ref 30.0–36.0)
MCV: 93 fL (ref 80.0–100.0)
Monocytes Absolute: 0.5 10*3/uL (ref 0.1–1.0)
Monocytes Relative: 10 %
Neutro Abs: 3.9 10*3/uL (ref 1.7–7.7)
Neutrophils Relative %: 69 %
Platelet Count: 90 10*3/uL — ABNORMAL LOW (ref 150–400)
RBC: 3.29 MIL/uL — ABNORMAL LOW (ref 3.87–5.11)
RDW: 18.5 % — ABNORMAL HIGH (ref 11.5–15.5)
WBC Count: 5.6 10*3/uL (ref 4.0–10.5)
nRBC: 0 % (ref 0.0–0.2)

## 2022-10-31 LAB — CMP (CANCER CENTER ONLY)
ALT: 13 U/L (ref 0–44)
AST: 22 U/L (ref 15–41)
Albumin: 3.5 g/dL (ref 3.5–5.0)
Alkaline Phosphatase: 90 U/L (ref 38–126)
Anion gap: 5 (ref 5–15)
BUN: 10 mg/dL (ref 8–23)
CO2: 26 mmol/L (ref 22–32)
Calcium: 8.6 mg/dL — ABNORMAL LOW (ref 8.9–10.3)
Chloride: 107 mmol/L (ref 98–111)
Creatinine: 0.63 mg/dL (ref 0.44–1.00)
GFR, Estimated: >60 mL/min (ref >60)
Glucose, Bld: 88 mg/dL (ref 70–99)
Potassium: 4.4 mmol/L (ref 3.5–5.1)
Sodium: 138 mmol/L (ref 135–145)
Total Bilirubin: 0.2 mg/dL — ABNORMAL LOW (ref 0.3–1.2)
Total Protein: 6.8 g/dL (ref 6.5–8.1)

## 2022-10-31 LAB — TOTAL PROTEIN, URINE DIPSTICK: Protein, ur: NEGATIVE mg/dL

## 2022-10-31 MED ORDER — LEUCOVORIN CALCIUM INJECTION 350 MG
400.0000 mg/m2 | Freq: Once | INTRAVENOUS | Status: AC
  Administered 2022-10-31: 600 mg via INTRAVENOUS
  Filled 2022-10-31: qty 30

## 2022-10-31 MED ORDER — SODIUM CHLORIDE 0.9 % IV SOLN: Freq: Once | INTRAVENOUS | Status: AC

## 2022-10-31 MED ORDER — ONDANSETRON HCL 4 MG/2ML IJ SOLN
8.0000 mg | Freq: Once | INTRAMUSCULAR | Status: AC
  Administered 2022-10-31: 8 mg via INTRAVENOUS
  Filled 2022-10-31: qty 4

## 2022-10-31 MED ORDER — SODIUM CHLORIDE 0.9 % IV SOLN
1800.0000 mg/m2 | INTRAVENOUS | Status: DC
  Administered 2022-10-31: 2500 mg via INTRAVENOUS
  Filled 2022-10-31: qty 50

## 2022-10-31 MED ORDER — CAPECITABINE 500 MG PO TABS: 1000.0000 mg/m2 | ORAL_TABLET | Freq: Two times a day (BID) | ORAL | 1 refills | Status: DC

## 2022-10-31 MED ORDER — CAPECITABINE 500 MG PO TABS
1000.0000 mg/m2 | ORAL_TABLET | Freq: Two times a day (BID) | ORAL | 1 refills | Status: DC
  Filled 2022-10-31: qty 84, 14d supply, fill #0

## 2022-10-31 MED ORDER — SODIUM CHLORIDE 0.9 % IV SOLN
5.0000 mg/kg | Freq: Once | INTRAVENOUS | Status: AC
  Administered 2022-10-31: 250 mg via INTRAVENOUS
  Filled 2022-10-31: qty 10

## 2022-10-31 NOTE — Telephone Encounter (Signed)
Oral Oncology Pharmacist Encounter  Received new prescription for Xeloda (capecitabine) for the treatment of metastatic rectal cancer in conjunction with bevacizumab, planned duration until disease progression or unacceptable drug toxicity.  CBC w/ Diff and CMP from 10/31/22 assessed, noted pt with pltc of 90 K/uL - no baseline dose adjustments required at this time. Prescription dose and frequency assessed for appropriateness. Of note, patient receiving 5-FU on 10/31/22, anticipated start date of Xeloda would be ~11/14/22.  Current medication list in Epic reviewed, DDIs with Xeloda identified: Category C DDI between Xeloda and Ondansetron due to risk of Qtc prolongation with fluorouracil products. Noted patient only taking PRN and PO route, risk higher with IV administration. No change in therapy warranted at this time.   Evaluated chart and no patient barriers to medication adherence noted.   Patient's insurance requires that Xeloda be filled through [facility] - prescription redirected for dispensing.   Oral Oncology Clinic will continue to follow for insurance authorization, copayment issues, initial counseling and start date.  Brittney Spencer, PharmD, BCPS, [facility] Hematology/Oncology Clinical Pharmacist [facility] and [facility] Oral Chemotherapy Navigation Clinics [phone] 10/31/2022 10:37 AM

## 2022-10-31 NOTE — Progress Notes (Signed)
Per Dr Mosetta Putt, ok to proceed with treatment today with platelet count 90. Oxaliplatin has been discontinued.

## 2022-10-31 NOTE — Patient Instructions (Signed)
[facility]  Discharge Instructions: Thank you for choosing [facility] to provide your oncology and hematology care.   If you have a lab appointment with the Cancer Center, please go directly to the Cancer Center and check in at the registration area.   Wear comfortable clothing and clothing appropriate for easy access to any Portacath or PICC line.   We strive to give you quality time with your provider. You may need to reschedule your appointment if you arrive late (15 or more minutes).  Arriving late affects you and other patients whose appointments are after yours.  Also, if you miss three or more appointments without notifying the office, you may be dismissed from the clinic at the provider's discretion.      For prescription refill requests, have your pharmacy contact our office and allow 72 hours for refills to be completed.    Today you received the following chemotherapy and/or immunotherapy agents: bevacizumab, leucovorin, fluorouracil      To help prevent nausea and vomiting after your treatment, we encourage you to take your nausea medication as directed.  BELOW ARE SYMPTOMS THAT SHOULD BE REPORTED IMMEDIATELY: *FEVER GREATER THAN 100.4 F (38 C) OR HIGHER *CHILLS OR SWEATING *NAUSEA AND VOMITING THAT IS NOT CONTROLLED WITH YOUR NAUSEA MEDICATION *UNUSUAL SHORTNESS OF BREATH *UNUSUAL BRUISING OR BLEEDING *URINARY PROBLEMS (pain or burning when urinating, or frequent urination) *BOWEL PROBLEMS (unusual diarrhea, constipation, pain near the anus) TENDERNESS IN MOUTH AND THROAT WITH OR WITHOUT PRESENCE OF ULCERS (sore throat, sores in mouth, or a toothache) UNUSUAL RASH, SWELLING OR PAIN  UNUSUAL VAGINAL DISCHARGE OR ITCHING   Items with * indicate a potential emergency and should be followed up as soon as possible or go to the Emergency Department if any problems should occur.  Please show the CHEMOTHERAPY ALERT CARD or  IMMUNOTHERAPY ALERT CARD at check-in to the Emergency Department and triage nurse.  Should you have questions after your visit or need to cancel or reschedule your appointment, please contact [facility]  Dept: [phone]  and follow the prompts.  Office hours are 8:00 a.m. to 4:30 p.m. Monday - Friday. Please note that voicemails left after 4:00 p.m. may not be returned until the following business day.  We are closed weekends and major holidays. You have access to a nurse at all times for urgent questions. Please call the main number to the clinic Dept: [phone] and follow the prompts.   For any non-urgent questions, you may also contact your provider using MyChart. We now offer e-Visits for anyone 56 and older to request care online for non-urgent symptoms. For details visit [email or web address].   Also download the MyChart app! Go to the app store, search "MyChart", open the app, select , and log in with your MyChart username and password.

## 2022-10-31 NOTE — Telephone Encounter (Signed)
Oral Oncology Patient Advocate Encounter  After completing a benefits investigation, prior authorization for capecitabine is not required at this time through [employer].  Patient's copay is $0.    Patient's insurance requires her to fill with [facility].   Jinger Neighbors, CPhT-Adv Oncology Pharmacy Patient Advocate [facility] Direct Number: [phone]  Fax: [phone]

## 2022-10-31 NOTE — Assessment & Plan Note (Signed)
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  -She began first-line chemo with dose-reduced FOLFIRI and Beva in 2/22.  -we changed to FOLFOX with continued Beva on 03/28/22. She tolerated well overall with some fatigue and cold sensitivity. -I personally reviewed her restaging CT from 10/07/2022 which showed overall SD in liver and rectum, stable and indermininate lung nodules, no other new lesions  -She is tolerating chemotherapy overall well, will continue. -I personally reviewed her restaging CT scan from October 07, 2022, which showed overall stable disease.  She has multiple small lung nodules, indeterminate, including one 7 mm nodule, will continue monitoring. -Recommend continue current therapy, she is tolerating well.  She agrees.

## 2022-10-31 NOTE — Assessment & Plan Note (Signed)
diagnosed in 12/2015. S/p right hemicolectomy with right salpingo oophorectomy and adjuvant ChemoRT. 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 

## 2022-10-31 NOTE — Assessment & Plan Note (Signed)
-  Patient understands her cancer is not curable, and the goal of therapy is palliative -She has agreed with DNR 

## 2022-10-31 NOTE — Progress Notes (Signed)
[facility]   [phone] [phone]   Clinic Follow up Note   Patient Care Team: Patient, No Pcp Per as PCP - General (General Practice) [name], NP as Nurse Practitioner (Oncology) [name], MD as Consulting Physician (Oncology) [name], RN (Inactive) as Oncology Nurse Navigator  Date of Service:  10/31/2022  CHIEF COMPLAINT: f/u of metastatic rectal cancer, h/o colon cancer        CURRENT THERAPY:    FOLFOX with bevacizumab, q14d, starting 03/28/22   ASSESSMENT:  Brittney Spencer is a 72 y.o. 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  -She began first-line chemo with dose-reduced FOLFIRI and Beva in 2/22.  -we changed to FOLFOX with continued Beva on 03/28/22. She tolerated well overall with some fatigue and cold sensitivity. -I personally reviewed her restaging CT from 10/07/2022 which showed overall SD in liver and rectum, stable and indermininate lung nodules, no other new lesions  -She is tolerating chemotherapy overall well, will continue. -I personally reviewed her restaging CT scan from October 07, 2022, which showed overall stable disease.  She has multiple small lung nodules, indeterminate, including one 7 mm nodule, will continue monitoring. 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  She preferred to stop oxaliplatin, will monitor her neuropathy closely. -Plan to change 5-FU pump to oral Xeloda from next cycle.  I discussed potential side effect, especially hand-foot syndrome, with her in detail, she agrees to proceed.  Cancer of ascending colon (HCC) diagnosed in 12/2015. S/p right hemicolectomy with right salpingo oophorectomy and adjuvant ChemoRT. -Her 04/2019 CT scan  was NED  -With rectal cancer diagnosis in 2022, her PET from 07/12/20 indicates soft tissue mass within the right iliac fossa is noted and consistent with local tumor recurrence from previous ascending colon tumor.  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 -lab flush and f/u 5/16   SUMMARY OF ONCOLOGIC HISTORY: Oncology History Overview Note  Cancer Staging Cancer of ascending colon [facility]) Staging form: Colon and Rectum, AJCC 7th Edition - Clinical stage from 02/07/2016: Stage IIIC (T4b, N1b, M0) - Signed by [name], MD on 03/04/2016 Laterality: Right Residual tumor (R): R2 - Macroscopic    Cancer of ascending colon (HCC)  10/25/2015 Imaging   CT ABD/PELVIS:  Inflammatory changes inferior to the cecal tip appear improved, there is still irregular soft tissue thickening of the cecal tip, and there are adjacent prominent lymph nodes in the ileocolonic mesentery, measuring 13 mm on image 49 and 8 mm on image 52. In addition, there is a 2.5 x 1.8 cm nodule on image 46 which has central low density. Therefore, these findings are moderately suspicious for an underlying cecal malignancy with perforation.    01/19/2016 Procedure   COLONOSCOPY per Dr. [name]: Fungating, ulcerated mass  almost obstructing mid ascending colon   01/19/2016 Initial Biopsy   Diagnosis Surgical [P], cecal mass - INVASIVE ADENOCARCINOMA WITH ULCERATION. - SEE COMMENT.   02/05/2016 Tumor Marker   Patient's tumor was tested for the  following markers: CEA Results of the tumor marker test revealed 5.7.   02/07/2016 Initial Diagnosis   Cancer of ascending colon (HCC)   02/07/2016 Definitive Surgery   Laparoscopic assisted right hemicolectomy and right salpingo oopherectomy--[name]   02/07/2016 Pathologic Stage   p T4 N1b   2/43 nodes +   02/07/2016 Pathology Results   MMR normal; G2 adenocarcinoma;proximal & distal margins negative; soft tissue mass on pelvic sidewall + for adenocarcinoma with positive margin MSI Stable   03/08/2016 Imaging   CT chest negative for metastasis.    03/19/2016 - 04/25/2016 Radiation Therapy   Adjuvant irradiation, 50 gray in 28 fractions   03/19/2016 - 04/22/2016 Chemotherapy   Xeloda 1500 mg twice daily, started on 03/19/2016, dose reduced to 1000 mg twice daily from week 3 due to neutropenia, and patient stopped 3 days before last dose radiation due to difficulty swallowing the pill    05/20/2016 -  Adjuvant Chemotherapy   Patient declined adjuvant chemotherapy   09/16/2016 Imaging   CT CAP w Contrast 1. No evidence of local tumor recurrence at the ileocolic anastomosis. 2. No findings suspicious for metastatic disease in the chest, abdomen or pelvis. 3. Nonspecific trace free fluid in the pelvic cul-de-sac. 4. Stable solitary 3 mm right upper lobe pulmonary nodule, for which 6 month stability has been demonstrated, probably benign. 5. Additional findings include stable right posterior pericardial cyst and small calcified uterine fibroids.   05/13/2017 Imaging   CT CAP W Contrast 05/13/17 IMPRESSION: 1. No current findings of residual or recurrent malignancy. 2. Mild prominence of stool throughout the colon. Nondistended portions of the rectum. 3. Several tiny pulmonary nodules are stable from the earliest available comparison of 03/08/2016 and probably benign, but may merit surveillance. 4. Other imaging findings of potential clinical significance: Old granulomatous  disease. Aortoiliac atherosclerotic vascular disease. Lumbar spondylosis and degenerative disc disease. Stable amount of trace free pelvic fluid.   04/27/2018 Imaging   04/27/2018 CT CAP IMPRESSION: Stable exam. No evidence of recurrent or metastatic carcinoma within the chest, abdomen, or pelvis   04/19/2019 Imaging   CT CAP W Contrast  IMPRESSION: Chest Impression:   1. No evidence of thoracic metastasis. 2. Stable small bilateral pulmonary nodules.   Abdomen / Pelvis Impression:   1. No evidence local colorectal carcinoma recurrence or metastasis in the abdomen pelvis. 2. Post RIGHT hemicolectomy.   01/22/2021 Imaging   CT CAP  IMPRESSION: CT CHEST IMPRESSION   1. Similar nonspecific pulmonary nodules. 2. New posterior left upper lobe reticulonodular opacity, suspicious for interval mild infection or inflammation. 3. No thoracic adenopathy.   CT ABDOMEN AND PELVIS IMPRESSION   1. Further decrease in size of high left hepatic lobe 3 mm low-density lesion. No new or progressive metastatic disease within the abdomen or pelvis. 2. Similar trace free pelvic fluid. 3. Similar nonspecific mid rectal wall thickening. 4.  Aortic Atherosclerosis (ICD10-I70.0).   04/23/2021 Imaging   CT CAP  IMPRESSION: 1. Treated metastatic lesion between segments 2 and 3 of the liver, slightly smaller and less distinct than prior examination. No other signs of definite metastatic disease elsewhere in the abdomen or pelvis. 2. Multiple small pulmonary nodules, stable compared to the prior examination, favored to be benign. No definitive findings to  suggest metastatic disease to the thorax. 3. Aortic atherosclerosis. 4. Additional incidental findings, as above.   08/13/2021 Imaging   EXAM: CT CHEST, ABDOMEN, AND PELVIS WITH CONTRAST  IMPRESSION: 1. A previously seen PET avid lesion of the anterior left lobe of the liver, hepatic segment II, is no longer discretely  appreciable consistent with treatment response of a hepatic metastasis. 2. No evidence of new metastatic disease in the chest, abdomen, or pelvis. 3. Interval increase in a small focus of consolidation and nodularity of the medial left upper lobe, consistent with minimal, ongoing atypical infection. Additional tiny bilateral pulmonary nodules are stable and almost certainly incidental benign. Attention on follow-up. 4. Status post right hemicolectomy and ileocolic anastomosis.   07/02/2022 Imaging    IMPRESSION: CHEST IMPRESSION:   1. No evidence of thoracic metastasis. 2. Stable small pulmonary nodules.   PELVIS IMPRESSION:   1. Stable to slight decrease in size of subcapsular lesion in the RIGHT hepatic lobe. 2. No evidence of new or progressive disease in the abdomen pelvis. 3.  Aortic Atherosclerosis (ICD10-I70.0).     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)  06/15/2020 Procedure   Screening Colonoscopy by Dr [name]  IMPRESSION - Decreased sphincter tone and internal hemorrhoids that prolapse with straining, but require manual replacement into the anal canal (Grade III) found on digital rectal exam. - Patent side-to-side ileo-colonic anastomosis, characterized by healthy appearing mucosa. - The examined portion of the ileum was normal. - One diminutive polyp in the proximal transverse colon, removed with a cold biopsy forceps. Resected and retrieved. - Likely malignant partially obstructing tumor in the mid rectum. Biopsied. Tattooed. - The examination was otherwise normal on direct and retroflexion views.   06/15/2020 Initial Biopsy   Diagnosis 1. Transverse Colon Polyp - HYPERPLASTIC POLYP 2. Rectum, biopsy - ADENOCARCINOMA ARISING IN A TUBULAR ADENOMA WITH HIGH-GRADE DYSPLASIA. SEE NOTE Diagnosis Note 2. Dr. Luisa Hart reviewed the case and concurs with the diagnosis. Dr. [name] was notified on 06/16/2020.   06/28/2020 Imaging   CT CAP  IMPRESSION: 1. New  low-density focus in the anterior aspect of the lateral segment LEFT hepatic lobe measuring 1.2 x 1.0 cm, compatible with small metastatic lesion in the LEFT hepatic lobe. 2. Soft tissue in the RIGHT iliac fossa following RIGHT hemicolectomy invades the psoas musculature and is slowly enlarging over time, more linear on the prior study now highly concerning for recurrence/metastasis to this location. 3. Signs of enteritis, potentially post radiation changes of the small bowel. Tethered small bowel in the RIGHT lower quadrant shows focal thickening and narrowing suspicious for small bowel involvement and developing partial obstruction though currently contrast passes beyond this point into the colon. 4. Rectal thickening in this patient with known rectal mass as described. 5. No evidence of metastatic disease in the chest. 6. Stable small pulmonary nodules. 7.  and aortic atherosclerosis.   Aortic Atherosclerosis (ICD10-I70.0) and Emphysema (ICD10-J43.9).   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 Exam positive for FDG avid rectal tumor which corresponds to the recent colonoscopy findings. 2. FDG avid soft tissue mass within the right iliac fossa is noted and consistent with local tumor recurrence from previous ascending colon tumor. 3. Lateral segment left lobe of liver lesion is FDG avid concerning for liver metastasis. 4. No specific findings identified to suggest metastatic disease to the chest.   08/10/2020 -  Chemotherapy   First-line FOLFIRI q2weeks starting 08/10/20. dose  reduced with cycle 1. Irinotecan/5FU increased and Bevacizumab added with cycle 2 on 08/23/2020    08/17/2020 - 03/09/2022 Chemotherapy   Patient is on Treatment Plan : COLORECTAL FOLFIRI + Bevacizumab q14d     10/27/2020 Imaging   CT CAP  IMPRESSION: 1. Interval decrease in size of the hypermetabolic left hepatic lesion, consistent  with metastatic disease. No new liver lesion evident. 2. Interval resolution of the hypermetabolic soft tissue lesion along the right iliac fossa with no measurable soft tissue lesion remaining at this location today. 3. Similar appearance of soft tissue fullness in the rectum at the site of the hypermetabolic lesion seen previously. 4. Stable tiny bilateral pulmonary nodules. Continued attention on follow-up recommended. 5. Small volume free fluid in the pelvis. 6. Aortic Atherosclerosis (ICD10-I70.0).   01/22/2021 Imaging   CT CAP  IMPRESSION: CT CHEST IMPRESSION   1. Similar nonspecific pulmonary nodules. 2. New posterior left upper lobe reticulonodular opacity, suspicious for interval mild infection or inflammation. 3. No thoracic adenopathy.   CT ABDOMEN AND PELVIS IMPRESSION   1. Further decrease in size of high left hepatic lobe 3 mm low-density lesion. No new or progressive metastatic disease within the abdomen or pelvis. 2. Similar trace free pelvic fluid. 3. Similar nonspecific mid rectal wall thickening. 4.  Aortic Atherosclerosis (ICD10-I70.0).   04/23/2021 Imaging   CT CAP  IMPRESSION: 1. Treated metastatic lesion between segments 2 and 3 of the liver, slightly smaller and less distinct than prior examination. No other signs of definite metastatic disease elsewhere in the abdomen or pelvis. 2. Multiple small pulmonary nodules, stable compared to the prior examination, favored to be benign. No definitive findings to suggest metastatic disease to the thorax. 3. Aortic atherosclerosis. 4. Additional incidental findings, as above.   08/13/2021 Imaging   EXAM: CT CHEST, ABDOMEN, AND PELVIS WITH CONTRAST  IMPRESSION: 1. A previously seen PET avid lesion of the anterior left lobe of the liver, hepatic segment II, is no longer discretely appreciable consistent with treatment response of a hepatic metastasis. 2. No evidence of new metastatic disease in the  chest, abdomen, or pelvis. 3. Interval increase in a small focus of consolidation and nodularity of the medial left upper lobe, consistent with minimal, ongoing atypical infection. Additional tiny bilateral pulmonary nodules are stable and almost certainly incidental benign. Attention on follow-up. 4. Status post right hemicolectomy and ileocolic anastomosis.   03/28/2022 -  Chemotherapy   Patient is on Treatment Plan : COLORECTAL FOLFOX + Bevacizumab q14d     07/02/2022 Imaging    IMPRESSION: CHEST IMPRESSION:   1. No evidence of thoracic metastasis. 2. Stable small pulmonary nodules.   PELVIS IMPRESSION:   1. Stable to slight decrease in size of subcapsular lesion in the RIGHT hepatic lobe. 2. No evidence of new or progressive disease in the abdomen pelvis.   10/07/2022 Imaging    IMPRESSION: 1. Stable hypovascular mass in the periphery of the right lobe of the liver, which likely represents a treated metastatic lesion. Stable subcentimeter lesion just medial to this is also similar to the recent prior examination. No new hepatic lesions are otherwise noted. 2. Persistent but stable poorly defined soft tissue thickening in the right lower quadrant associated with multiple small bowel loops and the overlying iliopsoas musculature, which corresponds to focal hypermetabolism on remote prior PET-CT. Given the stability, this likely represents a treated metastatic lesion. Continued attention on follow-up studies is recommended. 3. Persistent mass-like thickening in the proximal rectum corresponding to previously noted  hypermetabolic rectal neoplasm on prior PET-CT. This currently measures approximately 2.8 x 2.3 cm and is slightly more apparent than the most recent prior study. 4. Multiple small pulmonary nodules generally stable compared to the prior study, with exception of a new branching nodule in the anterior aspect of the left upper lobe measuring 7 x 3 mm (mean diameter 5  mm). This is nonspecific. Close attention on follow-up studies is recommended to ensure stability. 5. Aortic atherosclerosis. 6. Additional incidental findings, as above.        INTERVAL HISTORY:  Brittney Spencer is here for a follow up of metastatic rectal cancer, h/o colon cancer . She was last seen by me on 10/10/2022. She presents to the clinic alone. Pt state that she is having some numbness and tingling  In hands and feet. Pt notice last office visit. Pt denied falling. Pt state she also notice the numbness in her feet when she walks.    All other systems were reviewed with the patient and are negative.  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 Some residual intraretinal fluid remains, very small localized subfoveal of fluid remains although this continues to slowly resorb. We'll continue to observe.   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  ALLERGIES:  is allergic to venofer [iron sucrose], fish allergy, peanut-containing drug products, soy allergy, and buspirone.  MEDICATIONS:  Current Outpatient Medications  Medication Sig Dispense Refill   ALPRAZolam (XANAX) 0.25 MG tablet Take 1 tablet (0.25 mg total) by mouth daily as needed for anxiety. 30 tablet 0   capecitabine (XELODA) 500 MG tablet Take 3 tablets (1,500 mg total) by mouth 2 (two) times daily after a meal. Take within 30 minutes after meals. Take for 14 days on, then off for 7 days. Repeat every 21 days. 84 tablet 1   Cholecalciferol (VITAMIN D3 PO) Take by mouth daily.     docusate sodium (COLACE) 100 MG capsule 1 capsule as needed     lidocaine-prilocaine (EMLA) cream Apply 1 Application topically as needed. 30 g 1   NORVASC 2.5 MG tablet TAKE 1 TABLET BY MOUTH EVERY DAY 90 tablet 1   ondansetron (ZOFRAN) 8 MG tablet Take 1 tablet (  8 mg total) by mouth every 8 (eight) hours as needed for nausea or vomiting. 20 tablet 2   prochlorperazine (COMPAZINE) 10 MG tablet Take 1 tablet (10 mg total) by mouth every 6 (six) hours as needed for nausea or vomiting. 30 tablet 2   Current Facility-Administered Medications  Medication Dose Route Frequency Provider Last Rate Last Admin   0.9 %  sodium chloride infusion  500 mL Intravenous Once Sherrilyn Rist, MD       Facility-Administered Medications Ordered in Other Visits  Medication Dose Route Frequency Provider Last Rate Last Admin   fluorouracil (ADRUCIL) 2,500 mg in sodium chloride 0.9 % 100 mL chemo infusion  1,800 mg/m2 (Order-Specific) Intravenous 1 day or 1 dose [name], MD   Infusion Verify at 10/31/22 1137    PHYSICAL EXAMINATION: ECOG PERFORMANCE STATUS: 1 - Symptomatic but completely ambulatory  Vitals:   10/31/22 0920  BP: (!) 158/84  Pulse: 79  Resp: 16   Temp: 97.8 F (36.6 C)  SpO2: 100%   Wt Readings from Last 3 Encounters:  10/31/22 105 lb 8 oz (47.9 kg)  10/17/22 102 lb 1.9 oz (46.3 kg)  10/03/22 100 lb 6.4 oz (45.5 kg)     GENERAL:alert, no distress and comfortable SKIN: skin color normal, no rashes or significant lesions EYES: normal, Conjunctiva are pink and non-injected, sclera clear  NEURO: alert & oriented x 3 with fluent speech  LABORATORY DATA:  I have reviewed the data as listed    Latest Ref Rng & Units 10/31/2022    8:53 AM 10/17/2022   10:12 AM 10/03/2022    8:16 AM  CBC  WBC 4.0 - 10.5 K/uL 5.6  4.8  2.7   Hemoglobin 12.0 - 15.0 g/dL 9.5  9.3  86.5   Hematocrit 36.0 - 46.0 % 30.6  29.0  32.1   Platelets 150 - 400 K/uL 90  76  125         Latest Ref Rng & Units 10/31/2022    8:53 AM 10/17/2022   10:12 AM 10/03/2022    8:16 AM  CMP  Glucose 70 - 99 mg/dL 88  784  89   BUN 8 - 23 mg/dL 10  10  12    Creatinine 0.44 - 1.00 mg/dL 6.96  2.95  2.84   Sodium 135 - 145 mmol/L 138  137  137   Potassium 3.5 - 5.1 mmol/L 4.4  4.1  4.2   Chloride 98 - 111 mmol/L 107  107  104   CO2 22 - 32 mmol/L 26  27  27    Calcium 8.9 - 10.3 mg/dL 8.6  8.7  9.4   Total Protein 6.5 - 8.1 g/dL 6.8  6.7  6.9   Total Bilirubin 0.3 - 1.2 mg/dL 0.2  0.3  0.5   Alkaline Phos 38 - 126 U/L 90  84  74   AST 15 - 41 U/L 22  19  22    ALT 0 - 44 U/L 13  9  11        RADIOGRAPHIC STUDIES: I have personally reviewed the radiological images as listed and agreed with the findings in the report. No results found.    No orders of the defined types were placed in this encounter.  All questions were answered. The patient knows to call the clinic with any problems, questions or concerns. No barriers to learning was detected. The total time spent in the appointment was 25 minutes.  [name], MD 10/31/2022   Carolin Coy, CMA, am acting as scribe for [name], MD.   I have reviewed the above documentation for accuracy and  completeness, and I agree with the above.

## 2022-11-02 ENCOUNTER — Inpatient Hospital Stay: Payer: Federal, State, Local not specified - PPO

## 2022-11-02 ENCOUNTER — Other Ambulatory Visit: Payer: Self-pay

## 2022-11-02 VITALS — BP 138/78 | HR 86 | Temp 97.8°F | Resp 15

## 2022-11-02 DIAGNOSIS — C20 Malignant neoplasm of rectum: Secondary | ICD-10-CM

## 2022-11-02 DIAGNOSIS — Z5112 Encounter for antineoplastic immunotherapy: Secondary | ICD-10-CM | POA: Diagnosis not present

## 2022-11-02 MED ORDER — SODIUM CHLORIDE 0.9% FLUSH
10.0000 mL | INTRAVENOUS | Status: DC | PRN
  Administered 2022-11-02: 10 mL

## 2022-11-02 MED ORDER — PEGFILGRASTIM-JMDB 6 MG/0.6ML ~~LOC~~ SOSY
6.0000 mg | PREFILLED_SYRINGE | Freq: Once | SUBCUTANEOUS | Status: AC
  Administered 2022-11-02: 6 mg via SUBCUTANEOUS

## 2022-11-02 MED ORDER — HEPARIN SOD (PORK) LOCK FLUSH 100 UNIT/ML IV SOLN
500.0000 [IU] | Freq: Once | INTRAVENOUS | Status: AC | PRN
  Administered 2022-11-02: 500 [IU]

## 2022-11-05 ENCOUNTER — Other Ambulatory Visit (HOSPITAL_COMMUNITY): Payer: Self-pay

## 2022-11-05 ENCOUNTER — Other Ambulatory Visit: Payer: Self-pay

## 2022-11-05 MED ORDER — CAPECITABINE 500 MG PO TABS
1000.0000 mg/m2 | ORAL_TABLET | Freq: Two times a day (BID) | ORAL | 1 refills | Status: DC
Start: 2022-11-05 — End: 2023-02-12
  Filled 2022-11-05: qty 84, 14d supply, fill #0

## 2022-11-05 NOTE — Telephone Encounter (Signed)
Oral Chemotherapy Pharmacist Encounter   Spoke with patient today to follow up regarding patient's oral chemotherapy medication: Xeloda (capecitabine)  Despite patient's insurance requiring that she fill though [facility], Ms. Lagorio prefers to fill directly through the [employer]. She is aware that the cash price of the Xeloda would be $60 for her current dose and is OK with this price.   I will call patient back on 11/11/22 AM at 9AM to discuss the medication further in detail and have medication set up for patient to pick up at the [facility] on 11/14/22.  Patient knows to call the office with questions or concerns.  Lenord Carbo, PharmD, BCPS, BCOP Hematology/Oncology Clinical Pharmacist [facility] and [facility] Oral Chemotherapy Navigation Clinics [phone] 11/05/2022 10:10 AM

## 2022-11-11 ENCOUNTER — Other Ambulatory Visit (HOSPITAL_COMMUNITY): Payer: Self-pay

## 2022-11-11 NOTE — Telephone Encounter (Signed)
Oral Chemotherapy Pharmacist Encounter  I called and spoke with patient this morning to review Xeloda, as it was originally planned patient would switch from 5-FU pump to Xeloda tablets starting 11/14/22.  Patient is still apprehensive to start Xeloda tablets and wants to consider her options of switching from the 5-FU pump to tablets more. She would like to discuss further with Dr. Mosetta Putt at her appt on 11/14/22.  Oral chemotherapy clinic will stop following at this time, but if patient changes her mind to try the tablets in the future, we will help patient obtain medication at that time.   Lenord Carbo, PharmD, BCPS, BCOP Hematology/Oncology Clinical Pharmacist [facility] Oral Chemotherapy Navigation Clinics [phone] 11/11/2022 9:12 AM

## 2022-11-14 ENCOUNTER — Inpatient Hospital Stay (HOSPITAL_BASED_OUTPATIENT_CLINIC_OR_DEPARTMENT_OTHER): Payer: Federal, State, Local not specified - PPO | Admitting: Hematology

## 2022-11-14 ENCOUNTER — Inpatient Hospital Stay: Payer: Federal, State, Local not specified - PPO

## 2022-11-14 ENCOUNTER — Encounter: Payer: Self-pay | Admitting: Hematology

## 2022-11-14 ENCOUNTER — Other Ambulatory Visit: Payer: Self-pay

## 2022-11-14 VITALS — BP 135/85 | HR 86 | Temp 98.2°F | Resp 16 | Ht 66.0 in | Wt 101.3 lb

## 2022-11-14 DIAGNOSIS — C182 Malignant neoplasm of ascending colon: Secondary | ICD-10-CM

## 2022-11-14 DIAGNOSIS — C20 Malignant neoplasm of rectum: Secondary | ICD-10-CM

## 2022-11-14 DIAGNOSIS — C787 Secondary malignant neoplasm of liver and intrahepatic bile duct: Secondary | ICD-10-CM

## 2022-11-14 DIAGNOSIS — Z5112 Encounter for antineoplastic immunotherapy: Secondary | ICD-10-CM | POA: Diagnosis not present

## 2022-11-14 DIAGNOSIS — G620 Drug-induced polyneuropathy: Secondary | ICD-10-CM

## 2022-11-14 DIAGNOSIS — T451X5A Adverse effect of antineoplastic and immunosuppressive drugs, initial encounter: Secondary | ICD-10-CM

## 2022-11-14 DIAGNOSIS — Z95828 Presence of other vascular implants and grafts: Secondary | ICD-10-CM

## 2022-11-14 DIAGNOSIS — Z66 Do not resuscitate: Secondary | ICD-10-CM

## 2022-11-14 DIAGNOSIS — I10 Essential (primary) hypertension: Secondary | ICD-10-CM

## 2022-11-14 DIAGNOSIS — D500 Iron deficiency anemia secondary to blood loss (chronic): Secondary | ICD-10-CM

## 2022-11-14 LAB — CMP (CANCER CENTER ONLY)
ALT: 8 U/L (ref 0–44)
AST: 17 U/L (ref 15–41)
Albumin: 3.5 g/dL (ref 3.5–5.0)
Alkaline Phosphatase: 81 U/L (ref 38–126)
Anion gap: 5 (ref 5–15)
BUN: 14 mg/dL (ref 8–23)
CO2: 27 mmol/L (ref 22–32)
Calcium: 8.6 mg/dL — ABNORMAL LOW (ref 8.9–10.3)
Chloride: 105 mmol/L (ref 98–111)
Creatinine: 0.7 mg/dL (ref 0.44–1.00)
GFR, Estimated: >60 mL/min (ref >60)
Glucose, Bld: 86 mg/dL (ref 70–99)
Potassium: 4.2 mmol/L (ref 3.5–5.1)
Sodium: 137 mmol/L (ref 135–145)
Total Bilirubin: 0.3 mg/dL (ref 0.3–1.2)
Total Protein: 6.8 g/dL (ref 6.5–8.1)

## 2022-11-14 LAB — CBC WITH DIFFERENTIAL (CANCER CENTER ONLY)
Abs Immature Granulocytes: 0.02 10*3/uL (ref 0.00–0.07)
Basophils Absolute: 0 10*3/uL (ref 0.0–0.1)
Basophils Relative: 1 %
Eosinophils Absolute: 0.1 10*3/uL (ref 0.0–0.5)
Eosinophils Relative: 1 %
HCT: 32.1 % — ABNORMAL LOW (ref 36.0–46.0)
Hemoglobin: 9.9 g/dL — ABNORMAL LOW (ref 12.0–15.0)
Immature Granulocytes: 0 %
Lymphocytes Relative: 17 %
Lymphs Abs: 0.8 10*3/uL (ref 0.7–4.0)
MCH: 28.9 pg (ref 26.0–34.0)
MCHC: 30.8 g/dL (ref 30.0–36.0)
MCV: 93.6 fL (ref 80.0–100.0)
Monocytes Absolute: 0.5 10*3/uL (ref 0.1–1.0)
Monocytes Relative: 11 %
Neutro Abs: 3.3 10*3/uL (ref 1.7–7.7)
Neutrophils Relative %: 70 %
Platelet Count: 92 10*3/uL — ABNORMAL LOW (ref 150–400)
RBC: 3.43 MIL/uL — ABNORMAL LOW (ref 3.87–5.11)
RDW: 16.3 % — ABNORMAL HIGH (ref 11.5–15.5)
WBC Count: 4.7 10*3/uL (ref 4.0–10.5)
nRBC: 0 % (ref 0.0–0.2)

## 2022-11-14 LAB — CEA (IN HOUSE-CHCC): CEA (CHCC-In House): 32.55 ng/mL — ABNORMAL HIGH (ref 0.00–5.00)

## 2022-11-14 LAB — TOTAL PROTEIN, URINE DIPSTICK: Protein, ur: NEGATIVE mg/dL

## 2022-11-14 LAB — FERRITIN: Ferritin: 171 ng/mL (ref 11–307)

## 2022-11-14 MED ORDER — MIRTAZAPINE 7.5 MG PO TABS: 7.5000 mg | ORAL_TABLET | Freq: Every day | ORAL | 0 refills | Status: DC

## 2022-11-14 MED ORDER — SODIUM CHLORIDE 0.9 % IV SOLN: Freq: Once | INTRAVENOUS | Status: AC

## 2022-11-14 MED ORDER — SODIUM CHLORIDE 0.9% FLUSH
10.0000 mL | Freq: Once | INTRAVENOUS | Status: AC | PRN
  Administered 2022-11-14: 10 mL

## 2022-11-14 MED ORDER — SODIUM CHLORIDE 0.9 % IV SOLN
400.0000 mg/m2 | Freq: Once | INTRAVENOUS | Status: AC
  Administered 2022-11-14: 600 mg via INTRAVENOUS
  Filled 2022-11-14: qty 30

## 2022-11-14 MED ORDER — ONDANSETRON HCL 4 MG/2ML IJ SOLN
8.0000 mg | Freq: Once | INTRAMUSCULAR | Status: AC
  Administered 2022-11-14: 8 mg via INTRAVENOUS
  Filled 2022-11-14: qty 4

## 2022-11-14 MED ORDER — SODIUM CHLORIDE 0.9 % IV SOLN
5.0000 mg/kg | Freq: Once | INTRAVENOUS | Status: AC
  Administered 2022-11-14: 250 mg via INTRAVENOUS
  Filled 2022-11-14: qty 10

## 2022-11-14 MED ORDER — SODIUM CHLORIDE 0.9 % IV SOLN
1800.0000 mg/m2 | INTRAVENOUS | Status: DC
  Administered 2022-11-14: 2500 mg via INTRAVENOUS
  Filled 2022-11-14: qty 50

## 2022-11-14 NOTE — Assessment & Plan Note (Signed)
diagnosed in 12/2015. S/p right hemicolectomy with right salpingo oophorectomy and adjuvant ChemoRT. 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 

## 2022-11-14 NOTE — Assessment & Plan Note (Signed)
-  will monitor her iron level and give iv iron as needed

## 2022-11-14 NOTE — Progress Notes (Signed)
[facility]   [phone] [phone]   Clinic Follow up Note   Patient Care Team: Patient, No Pcp Per as PCP - General (General Practice) [name], NP as Nurse Practitioner (Oncology) [name], MD as Consulting Physician (Oncology) [name], RN (Inactive) as Oncology Nurse Navigator  Date of Service:  11/14/2022  CHIEF COMPLAINT: f/u of  metastatic rectal cancer, h/o colon cancer        CURRENT THERAPY:    FOLFOX with bevacizumab, q14d, starting 03/28/22   ASSESSMENT: Brittney Spencer is a 72 y.o. 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  -She began first-line chemo with dose-reduced FOLFIRI and Beva in 2/22.  -we changed to FOLFOX with continued Beva on 03/28/22. She tolerated well overall with some fatigue and cold sensitivity. -I personally reviewed her restaging CT from 10/07/2022 which showed overall SD in liver and rectum, stable and indermininate lung nodules, no other new lesions  -She is tolerating chemotherapy overall well, will continue. -I personally reviewed her restaging CT scan from October 07, 2022, which showed overall stable disease.  She has multiple small lung nodules, indeterminate, including one 7 mm nodule, will continue monitoring. 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  She preferred to stop oxaliplatin, will monitor her neuropathy closely. -we previously discussed changing 5-FU pump to oral Xeloda from this cycle, she is not sure if she should change, she would like to stay on 5-FU pump today and for next cycle in 3 weeks.  Cancer of ascending colon (HCC) diagnosed in 12/2015. S/p right hemicolectomy with right salpingo oophorectomy and adjuvant  ChemoRT. 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 -Worse lately, with tingling in hands and feet -Will stop oxaliplatin today -I recommend her to take over-the-counter B12       PLAN: -lab reviewed, adequate for treatment, will proceed with 5-FU and bevacizumab today -Post pone next treatment for week due to her event on 6/3  - I prescribe Mirtazapine  for appetite stimulant -f/u in 3 weeks    SUMMARY OF ONCOLOGIC HISTORY: Oncology History Overview Note  Cancer Staging Cancer of ascending colon (HCC) Staging form: Colon and Rectum, AJCC 7th Edition - Clinical stage from 02/07/2016: Stage IIIC (T4b, N1b, M0) - Signed by [name], MD on 03/04/2016 Laterality: Right Residual tumor (R): R2 - Macroscopic    Cancer of ascending colon (HCC)  10/25/2015 Imaging   CT ABD/PELVIS:  Inflammatory changes inferior to the cecal tip appear improved, there is still irregular soft tissue thickening of the cecal tip, and there are adjacent prominent lymph nodes in the ileocolonic mesentery, measuring 13 mm on image 49 and 8 mm on image 52. In addition, there is a 2.5 x 1.8 cm  nodule on image 46 which has central low density. Therefore, these findings are moderately suspicious for an underlying cecal malignancy with perforation.    01/19/2016 Procedure   COLONOSCOPY per Dr. [name]: Fungating, ulcerated mass almost obstructing mid ascending colon   01/19/2016 Initial Biopsy    Diagnosis Surgical [P], cecal mass - INVASIVE ADENOCARCINOMA WITH ULCERATION. - SEE COMMENT.   02/05/2016 Tumor Marker   Patient's tumor was tested for the following markers: CEA Results of the tumor marker test revealed 5.7.   02/07/2016 Initial Diagnosis   Cancer of ascending colon (HCC)   02/07/2016 Definitive Surgery   Laparoscopic assisted right hemicolectomy and right salpingo oopherectomy--[name]   02/07/2016 Pathologic Stage   p T4 N1b   2/43 nodes +   02/07/2016 Pathology Results   MMR normal; G2 adenocarcinoma;proximal & distal margins negative; soft tissue mass on pelvic sidewall + for adenocarcinoma with positive margin MSI Stable   03/08/2016 Imaging   CT chest negative for metastasis.    03/19/2016 - 04/25/2016 Radiation Therapy   Adjuvant irradiation, 50 gray in 28 fractions   03/19/2016 - 04/22/2016 Chemotherapy   Xeloda 1500 mg twice daily, started on 03/19/2016, dose reduced to 1000 mg twice daily from week 3 due to neutropenia, and patient stopped 3 days before last dose radiation due to difficulty swallowing the pill    05/20/2016 -  Adjuvant Chemotherapy   Patient declined adjuvant chemotherapy   09/16/2016 Imaging   CT CAP w Contrast 1. No evidence of local tumor recurrence at the ileocolic anastomosis. 2. No findings suspicious for metastatic disease in the chest, abdomen or pelvis. 3. Nonspecific trace free fluid in the pelvic cul-de-sac. 4. Stable solitary 3 mm right upper lobe pulmonary nodule, for which 6 month stability has been demonstrated, probably benign. 5. Additional findings include stable right posterior pericardial cyst and small calcified uterine fibroids.   05/13/2017 Imaging   CT CAP W Contrast 05/13/17 IMPRESSION: 1. No current findings of residual or recurrent malignancy. 2. Mild prominence of stool throughout the colon. Nondistended portions of the rectum. 3. Several tiny pulmonary nodules are stable from the earliest available  comparison of 03/08/2016 and probably benign, but may merit surveillance. 4. Other imaging findings of potential clinical significance: Old granulomatous disease. Aortoiliac atherosclerotic vascular disease. Lumbar spondylosis and degenerative disc disease. Stable amount of trace free pelvic fluid.   04/27/2018 Imaging   04/27/2018 CT CAP IMPRESSION: Stable exam. No evidence of recurrent or metastatic carcinoma within the chest, abdomen, or pelvis   04/19/2019 Imaging   CT CAP W Contrast  IMPRESSION: Chest Impression:   1. No evidence of thoracic metastasis. 2. Stable small bilateral pulmonary nodules.   Abdomen / Pelvis Impression:   1. No evidence local colorectal carcinoma recurrence or metastasis in the abdomen pelvis. 2. Post RIGHT hemicolectomy.   01/22/2021 Imaging   CT CAP  IMPRESSION: CT CHEST IMPRESSION   1. Similar nonspecific pulmonary nodules. 2. New posterior left upper lobe reticulonodular opacity, suspicious for interval mild infection or inflammation. 3. No thoracic adenopathy.   CT ABDOMEN AND PELVIS IMPRESSION   1. Further decrease in size of high left hepatic lobe 3 mm low-density lesion. No new or progressive metastatic disease within the abdomen or pelvis. 2. Similar trace free pelvic fluid. 3. Similar nonspecific mid rectal wall thickening. 4.  Aortic Atherosclerosis (ICD10-I70.0).   04/23/2021 Imaging   CT CAP  IMPRESSION: 1. Treated metastatic lesion between segments 2 and 3 of the liver, slightly smaller and less  distinct than prior examination. No other signs of definite metastatic disease elsewhere in the abdomen or pelvis. 2. Multiple small pulmonary nodules, stable compared to the prior examination, favored to be benign. No definitive findings to suggest metastatic disease to the thorax. 3. Aortic atherosclerosis. 4. Additional incidental findings, as above.   08/13/2021 Imaging   EXAM: CT CHEST, ABDOMEN, AND PELVIS WITH  CONTRAST  IMPRESSION: 1. A previously seen PET avid lesion of the anterior left lobe of the liver, hepatic segment II, is no longer discretely appreciable consistent with treatment response of a hepatic metastasis. 2. No evidence of new metastatic disease in the chest, abdomen, or pelvis. 3. Interval increase in a small focus of consolidation and nodularity of the medial left upper lobe, consistent with minimal, ongoing atypical infection. Additional tiny bilateral pulmonary nodules are stable and almost certainly incidental benign. Attention on follow-up. 4. Status post right hemicolectomy and ileocolic anastomosis.   07/02/2022 Imaging    IMPRESSION: CHEST IMPRESSION:   1. No evidence of thoracic metastasis. 2. Stable small pulmonary nodules.   PELVIS IMPRESSION:   1. Stable to slight decrease in size of subcapsular lesion in the RIGHT hepatic lobe. 2. No evidence of new or progressive disease in the abdomen pelvis. 3.  Aortic Atherosclerosis (ICD10-I70.0).     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)  06/15/2020 Procedure   Screening Colonoscopy by Dr [name]  IMPRESSION - Decreased sphincter tone and internal hemorrhoids that prolapse with straining, but require manual replacement into the anal canal (Grade III) found on digital rectal exam. - Patent side-to-side ileo-colonic anastomosis, characterized by healthy appearing mucosa. - The examined portion of the ileum was normal. - One diminutive polyp in the proximal transverse colon, removed with a cold biopsy forceps. Resected and retrieved. - Likely malignant partially obstructing tumor in the mid rectum. Biopsied. Tattooed. - The examination was otherwise normal on direct and retroflexion views.   06/15/2020 Initial Biopsy   Diagnosis 1. Transverse Colon Polyp - HYPERPLASTIC POLYP 2. Rectum, biopsy - ADENOCARCINOMA ARISING IN A TUBULAR ADENOMA WITH HIGH-GRADE DYSPLASIA. SEE NOTE Diagnosis Note 2. Dr. Luisa Hart  reviewed the case and concurs with the diagnosis. Dr. [name] was notified on 06/16/2020.   06/28/2020 Imaging   CT CAP  IMPRESSION: 1. New low-density focus in the anterior aspect of the lateral segment LEFT hepatic lobe measuring 1.2 x 1.0 cm, compatible with small metastatic lesion in the LEFT hepatic lobe. 2. Soft tissue in the RIGHT iliac fossa following RIGHT hemicolectomy invades the psoas musculature and is slowly enlarging over time, more linear on the prior study now highly concerning for recurrence/metastasis to this location. 3. Signs of enteritis, potentially post radiation changes of the small bowel. Tethered small bowel in the RIGHT lower quadrant shows focal thickening and narrowing suspicious for small bowel involvement and developing partial obstruction though currently contrast passes beyond this point into the colon. 4. Rectal thickening in this patient with known rectal mass as described. 5. No evidence of metastatic disease in the chest. 6. Stable small pulmonary nodules. 7.  and aortic atherosclerosis.   Aortic Atherosclerosis (ICD10-I70.0) and Emphysema (ICD10-J43.9).   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 Exam positive for FDG avid rectal tumor which corresponds to the recent colonoscopy findings. 2. FDG avid soft tissue mass within the right iliac fossa is noted and consistent with local tumor recurrence from previous ascending colon tumor. 3. Lateral segment left lobe  of liver lesion is FDG avid concerning for liver metastasis. 4. No specific findings identified to suggest metastatic disease to the chest.   08/10/2020 -  Chemotherapy   First-line FOLFIRI q2weeks starting 08/10/20. dose reduced with cycle 1. Irinotecan/5FU increased and Bevacizumab added with cycle 2 on 08/23/2020    08/17/2020 - 03/09/2022 Chemotherapy   Patient is on Treatment Plan : COLORECTAL FOLFIRI +  Bevacizumab q14d     10/27/2020 Imaging   CT CAP  IMPRESSION: 1. Interval decrease in size of the hypermetabolic left hepatic lesion, consistent with metastatic disease. No new liver lesion evident. 2. Interval resolution of the hypermetabolic soft tissue lesion along the right iliac fossa with no measurable soft tissue lesion remaining at this location today. 3. Similar appearance of soft tissue fullness in the rectum at the site of the hypermetabolic lesion seen previously. 4. Stable tiny bilateral pulmonary nodules. Continued attention on follow-up recommended. 5. Small volume free fluid in the pelvis. 6. Aortic Atherosclerosis (ICD10-I70.0).   01/22/2021 Imaging   CT CAP  IMPRESSION: CT CHEST IMPRESSION   1. Similar nonspecific pulmonary nodules. 2. New posterior left upper lobe reticulonodular opacity, suspicious for interval mild infection or inflammation. 3. No thoracic adenopathy.   CT ABDOMEN AND PELVIS IMPRESSION   1. Further decrease in size of high left hepatic lobe 3 mm low-density lesion. No new or progressive metastatic disease within the abdomen or pelvis. 2. Similar trace free pelvic fluid. 3. Similar nonspecific mid rectal wall thickening. 4.  Aortic Atherosclerosis (ICD10-I70.0).   04/23/2021 Imaging   CT CAP  IMPRESSION: 1. Treated metastatic lesion between segments 2 and 3 of the liver, slightly smaller and less distinct than prior examination. No other signs of definite metastatic disease elsewhere in the abdomen or pelvis. 2. Multiple small pulmonary nodules, stable compared to the prior examination, favored to be benign. No definitive findings to suggest metastatic disease to the thorax. 3. Aortic atherosclerosis. 4. Additional incidental findings, as above.   08/13/2021 Imaging   EXAM: CT CHEST, ABDOMEN, AND PELVIS WITH CONTRAST  IMPRESSION: 1. A previously seen PET avid lesion of the anterior left lobe of the liver, hepatic segment II,  is no longer discretely appreciable consistent with treatment response of a hepatic metastasis. 2. No evidence of new metastatic disease in the chest, abdomen, or pelvis. 3. Interval increase in a small focus of consolidation and nodularity of the medial left upper lobe, consistent with minimal, ongoing atypical infection. Additional tiny bilateral pulmonary nodules are stable and almost certainly incidental benign. Attention on follow-up. 4. Status post right hemicolectomy and ileocolic anastomosis.   03/28/2022 -  Chemotherapy   Patient is on Treatment Plan : COLORECTAL FOLFOX + Bevacizumab q14d     07/02/2022 Imaging    IMPRESSION: CHEST IMPRESSION:   1. No evidence of thoracic metastasis. 2. Stable small pulmonary nodules.   PELVIS IMPRESSION:   1. Stable to slight decrease in size of subcapsular lesion in the RIGHT hepatic lobe. 2. No evidence of new or progressive disease in the abdomen pelvis.   10/07/2022 Imaging    IMPRESSION: 1. Stable hypovascular mass in the periphery of the right lobe of the liver, which likely represents a treated metastatic lesion. Stable subcentimeter lesion just medial to this is also similar to the recent prior examination. No new hepatic lesions are otherwise noted. 2. Persistent but stable poorly defined soft tissue thickening in the right lower quadrant associated with multiple small bowel loops and the overlying iliopsoas musculature, which corresponds  to focal hypermetabolism on remote prior PET-CT. Given the stability, this likely represents a treated metastatic lesion. Continued attention on follow-up studies is recommended. 3. Persistent mass-like thickening in the proximal rectum corresponding to previously noted hypermetabolic rectal neoplasm on prior PET-CT. This currently measures approximately 2.8 x 2.3 cm and is slightly more apparent than the most recent prior study. 4. Multiple small pulmonary nodules generally stable compared  to the prior study, with exception of a new branching nodule in the anterior aspect of the left upper lobe measuring 7 x 3 mm (mean diameter 5 mm). This is nonspecific. Close attention on follow-up studies is recommended to ensure stability. 5. Aortic atherosclerosis. 6. Additional incidental findings, as above.        INTERVAL HISTORY:  Brittney Spencer is here for a follow up of  metastatic rectal cancer, h/o colon cancer. She was last seen by me on 10/31/2022. She presents to the clinic alone. Pt state that she has some concerns about the change of chemo regiment.Pt report her neuropathy in her feet and hands are still there, but she is being very mindful. Pt denies having any BM issues and her appetite is good.    All other systems were reviewed with the patient and are negative.  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 Some residual intraretinal fluid remains, very small localized subfoveal of fluid remains although this continues to slowly resorb. We'll continue to observe.   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  ALLERGIES:  is allergic to venofer [iron sucrose], fish allergy, peanut-containing drug products, soy allergy, and buspirone.  MEDICATIONS:  Current Outpatient Medications  Medication Sig Dispense Refill   mirtazapine (REMERON) 7.5 MG tablet Take 1 tablet (7.5 mg total) by mouth at bedtime. 30 tablet 0   ALPRAZolam (XANAX) 0.25 MG tablet Take 1 tablet (0.25 mg total) by mouth daily as needed for anxiety. 30 tablet 0   capecitabine (XELODA) 500 MG tablet Take 3 tablets (1,500 mg total) by mouth 2 (two) times daily after a meal. Take within 30 minutes after meals. Take for 14 days on, then off for 7 days. Repeat every 21 days. 84 tablet 1   Cholecalciferol (VITAMIN D3  PO) Take by mouth daily.     docusate sodium (COLACE) 100 MG capsule 1 capsule as needed     lidocaine-prilocaine (EMLA) cream Apply 1 Application topically as needed. 30 g 1   NORVASC 2.5 MG tablet TAKE 1 TABLET BY MOUTH EVERY DAY 90 tablet 1   ondansetron (ZOFRAN) 8 MG tablet Take 1 tablet (8 mg total) by mouth every 8 (eight) hours as needed for nausea or vomiting. 20 tablet 2   prochlorperazine (COMPAZINE) 10 MG tablet Take 1 tablet (10 mg total) by mouth every 6 (six) hours as needed for nausea or vomiting. 30 tablet 2   Current Facility-Administered Medications  Medication Dose Route Frequency Provider Last Rate Last Admin   0.9 %  sodium chloride infusion  500 mL Intravenous Once Sherrilyn Rist, MD       Facility-Administered Medications Ordered in Other Visits  Medication Dose Route Frequency Provider Last Rate Last Admin   fluorouracil (ADRUCIL) 2,500 mg in sodium chloride 0.9 % 100 mL chemo  infusion  1,800 mg/m2 (Order-Specific) Intravenous 1 day or 1 dose [name], MD   Infusion Verify at 11/14/22 1118    PHYSICAL EXAMINATION: ECOG PERFORMANCE STATUS: 1 - Symptomatic but completely ambulatory  Vitals:   11/14/22 0831  BP: 135/85  Pulse: 86  Resp: 16  Temp: 98.2 F (36.8 C)  SpO2: 100%   Wt Readings from Last 3 Encounters:  11/14/22 101 lb 4.8 oz (45.9 kg)  10/31/22 105 lb 8 oz (47.9 kg)  10/17/22 102 lb 1.9 oz (46.3 kg)     GENERAL:alert, no distress and comfortable SKIN: skin color normal, no rashes or significant lesions EYES: normal, Conjunctiva are pink and non-injected, sclera clear  NEURO: alert & oriented x 3 with fluent speech   LABORATORY DATA:  I have reviewed the data as listed    Latest Ref Rng & Units 11/14/2022    7:42 AM 10/31/2022    8:53 AM 10/17/2022   10:12 AM  CBC  WBC 4.0 - 10.5 K/uL 4.7  5.6  4.8   Hemoglobin 12.0 - 15.0 g/dL 9.9  9.5  9.3   Hematocrit 36.0 - 46.0 % 32.1  30.6  29.0   Platelets 150 - 400 K/uL 92  90  76         Latest Ref Rng & Units 11/14/2022    7:42 AM 10/31/2022    8:53 AM 10/17/2022   10:12 AM  CMP  Glucose 70 - 99 mg/dL 86  88  161   BUN 8 - 23 mg/dL 14  10  10    Creatinine 0.44 - 1.00 mg/dL 0.96  0.45  4.09   Sodium 135 - 145 mmol/L 137  138  137   Potassium 3.5 - 5.1 mmol/L 4.2  4.4  4.1   Chloride 98 - 111 mmol/L 105  107  107   CO2 22 - 32 mmol/L 27  26  27    Calcium 8.9 - 10.3 mg/dL 8.6  8.6  8.7   Total Protein 6.5 - 8.1 g/dL 6.8  6.8  6.7   Total Bilirubin 0.3 - 1.2 mg/dL 0.3  0.2  0.3   Alkaline Phos 38 - 126 U/L 81  90  84   AST 15 - 41 U/L 17  22  19    ALT 0 - 44 U/L 8  13  9        RADIOGRAPHIC STUDIES: I have personally reviewed the radiological images as listed and agreed  with the findings in the report. No results found.    Orders Placed This Encounter  Procedures   CBC with Differential (Cancer Center Only)    Standing Status:   Future    Standing Expiration Date:   12/06/2023    CMP (Cancer Center only)    Standing Status:   Future    Standing Expiration Date:   12/06/2023   Total Protein, Urine dipstick    Standing Status:   Future    Standing Expiration Date:   12/06/2023   All questions were answered. The patient knows to call the clinic with any problems, questions or concerns. No barriers to learning was detected. The total time spent in the appointment was 25 minutes.     [name], MD 11/14/2022   Carolin Coy, CMA, am acting as scribe for [name], MD.   I have reviewed the above documentation for accuracy and completeness, and I agree with the above.

## 2022-11-14 NOTE — Assessment & Plan Note (Signed)
-  Patient understands her cancer is not curable, and the goal of therapy is palliative -She has agreed with DNR 

## 2022-11-14 NOTE — Assessment & Plan Note (Signed)
-  Secondary to oxaliplatin -Worse lately, with tingling in hands and feet -Will stop oxaliplatin today -I recommend her to take over-the-counter B12

## 2022-11-14 NOTE — Patient Instructions (Signed)
[facility]  Discharge Instructions: Thank you for choosing [facility] to provide your oncology and hematology care.   If you have a lab appointment with the Cancer Center, please go directly to the Cancer Center and check in at the registration area.   Wear comfortable clothing and clothing appropriate for easy access to any Portacath or PICC line.   We strive to give you quality time with your provider. You may need to reschedule your appointment if you arrive late (15 or more minutes).  Arriving late affects you and other patients whose appointments are after yours.  Also, if you miss three or more appointments without notifying the office, you may be dismissed from the clinic at the provider's discretion.      For prescription refill requests, have your pharmacy contact our office and allow 72 hours for refills to be completed.    Today you received the following chemotherapy and/or immunotherapy agents bevacizumab, leucovorin, fluorourcil      To help prevent nausea and vomiting after your treatment, we encourage you to take your nausea medication as directed.  BELOW ARE SYMPTOMS THAT SHOULD BE REPORTED IMMEDIATELY: *FEVER GREATER THAN 100.4 F (38 C) OR HIGHER *CHILLS OR SWEATING *NAUSEA AND VOMITING THAT IS NOT CONTROLLED WITH YOUR NAUSEA MEDICATION *UNUSUAL SHORTNESS OF BREATH *UNUSUAL BRUISING OR BLEEDING *URINARY PROBLEMS (pain or burning when urinating, or frequent urination) *BOWEL PROBLEMS (unusual diarrhea, constipation, pain near the anus) TENDERNESS IN MOUTH AND THROAT WITH OR WITHOUT PRESENCE OF ULCERS (sore throat, sores in mouth, or a toothache) UNUSUAL RASH, SWELLING OR PAIN  UNUSUAL VAGINAL DISCHARGE OR ITCHING   Items with * indicate a potential emergency and should be followed up as soon as possible or go to the Emergency Department if any problems should occur.  Please show the CHEMOTHERAPY ALERT CARD or  IMMUNOTHERAPY ALERT CARD at check-in to the Emergency Department and triage nurse.  Should you have questions after your visit or need to cancel or reschedule your appointment, please contact [facility]  Dept: [phone]  and follow the prompts.  Office hours are 8:00 a.m. to 4:30 p.m. Monday - Friday. Please note that voicemails left after 4:00 p.m. may not be returned until the following business day.  We are closed weekends and major holidays. You have access to a nurse at all times for urgent questions. Please call the main number to the clinic Dept: [phone] and follow the prompts.   For any non-urgent questions, you may also contact your provider using MyChart. We now offer e-Visits for anyone 45 and older to request care online for non-urgent symptoms. For details visit [email or web address].   Also download the MyChart app! Go to the app store, search "MyChart", open the app, select , and log in with your MyChart username and password.

## 2022-11-14 NOTE — Assessment & Plan Note (Signed)
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  -She began first-line chemo with dose-reduced FOLFIRI and Beva in 2/22.  -we changed to FOLFOX with continued Beva on 03/28/22. She tolerated well overall with some fatigue and cold sensitivity. -I personally reviewed her restaging CT from 10/07/2022 which showed overall SD in liver and rectum, stable and indermininate lung nodules, no other new lesions  -She is tolerating chemotherapy overall well, will continue. -I personally reviewed her restaging CT scan from October 07, 2022, which showed overall stable disease.  She has multiple small lung nodules, indeterminate, including one 7 mm nodule, will continue monitoring. 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  She preferred to stop oxaliplatin, will monitor her neuropathy closely. -Plan to change 5-FU pump to oral Xeloda from this cycle

## 2022-11-15 ENCOUNTER — Other Ambulatory Visit: Payer: Self-pay

## 2022-11-16 ENCOUNTER — Inpatient Hospital Stay: Payer: Federal, State, Local not specified - PPO

## 2022-11-16 VITALS — BP 118/53 | HR 92 | Temp 97.9°F | Resp 18

## 2022-11-16 DIAGNOSIS — Z5112 Encounter for antineoplastic immunotherapy: Secondary | ICD-10-CM | POA: Diagnosis not present

## 2022-11-16 DIAGNOSIS — C20 Malignant neoplasm of rectum: Secondary | ICD-10-CM

## 2022-11-16 MED ORDER — PEGFILGRASTIM-JMDB 6 MG/0.6ML ~~LOC~~ SOSY
6.0000 mg | PREFILLED_SYRINGE | Freq: Once | SUBCUTANEOUS | Status: AC
  Administered 2022-11-16: 6 mg via SUBCUTANEOUS

## 2022-11-16 MED ORDER — SODIUM CHLORIDE 0.9% FLUSH
10.0000 mL | INTRAVENOUS | Status: DC | PRN
  Administered 2022-11-16: 10 mL

## 2022-11-16 MED ORDER — HEPARIN SOD (PORK) LOCK FLUSH 100 UNIT/ML IV SOLN
500.0000 [IU] | Freq: Once | INTRAVENOUS | Status: AC | PRN
  Administered 2022-11-16: 500 [IU]

## 2022-12-05 ENCOUNTER — Inpatient Hospital Stay: Payer: Federal, State, Local not specified - PPO | Attending: Nurse Practitioner

## 2022-12-05 ENCOUNTER — Other Ambulatory Visit: Payer: Self-pay

## 2022-12-05 ENCOUNTER — Inpatient Hospital Stay: Payer: Federal, State, Local not specified - PPO

## 2022-12-05 ENCOUNTER — Inpatient Hospital Stay (HOSPITAL_BASED_OUTPATIENT_CLINIC_OR_DEPARTMENT_OTHER): Payer: Federal, State, Local not specified - PPO | Admitting: Hematology

## 2022-12-05 ENCOUNTER — Encounter: Payer: Self-pay | Admitting: Hematology

## 2022-12-05 VITALS — BP 147/83 | HR 85 | Temp 98.1°F | Ht 66.0 in | Wt 102.9 lb

## 2022-12-05 DIAGNOSIS — Z9221 Personal history of antineoplastic chemotherapy: Secondary | ICD-10-CM | POA: Insufficient documentation

## 2022-12-05 DIAGNOSIS — C787 Secondary malignant neoplasm of liver and intrahepatic bile duct: Secondary | ICD-10-CM | POA: Insufficient documentation

## 2022-12-05 DIAGNOSIS — G620 Drug-induced polyneuropathy: Secondary | ICD-10-CM

## 2022-12-05 DIAGNOSIS — Z66 Do not resuscitate: Secondary | ICD-10-CM

## 2022-12-05 DIAGNOSIS — C182 Malignant neoplasm of ascending colon: Secondary | ICD-10-CM | POA: Diagnosis not present

## 2022-12-05 DIAGNOSIS — C20 Malignant neoplasm of rectum: Secondary | ICD-10-CM

## 2022-12-05 DIAGNOSIS — Z95828 Presence of other vascular implants and grafts: Secondary | ICD-10-CM

## 2022-12-05 DIAGNOSIS — T451X5D Adverse effect of antineoplastic and immunosuppressive drugs, subsequent encounter: Secondary | ICD-10-CM | POA: Diagnosis not present

## 2022-12-05 DIAGNOSIS — Z1509 Genetic susceptibility to other malignant neoplasm: Secondary | ICD-10-CM | POA: Insufficient documentation

## 2022-12-05 DIAGNOSIS — Z5111 Encounter for antineoplastic chemotherapy: Secondary | ICD-10-CM | POA: Insufficient documentation

## 2022-12-05 DIAGNOSIS — T451X5A Adverse effect of antineoplastic and immunosuppressive drugs, initial encounter: Secondary | ICD-10-CM

## 2022-12-05 DIAGNOSIS — D500 Iron deficiency anemia secondary to blood loss (chronic): Secondary | ICD-10-CM | POA: Insufficient documentation

## 2022-12-05 DIAGNOSIS — Z9049 Acquired absence of other specified parts of digestive tract: Secondary | ICD-10-CM | POA: Insufficient documentation

## 2022-12-05 DIAGNOSIS — Z5189 Encounter for other specified aftercare: Secondary | ICD-10-CM | POA: Diagnosis not present

## 2022-12-05 DIAGNOSIS — Z5112 Encounter for antineoplastic immunotherapy: Secondary | ICD-10-CM | POA: Insufficient documentation

## 2022-12-05 DIAGNOSIS — Z923 Personal history of irradiation: Secondary | ICD-10-CM | POA: Insufficient documentation

## 2022-12-05 LAB — CMP (CANCER CENTER ONLY)
ALT: 12 U/L (ref 0–44)
AST: 22 U/L (ref 15–41)
Albumin: 3.6 g/dL (ref 3.5–5.0)
Alkaline Phosphatase: 82 U/L (ref 38–126)
Anion gap: 4 — ABNORMAL LOW (ref 5–15)
BUN: 11 mg/dL (ref 8–23)
CO2: 28 mmol/L (ref 22–32)
Calcium: 8.7 mg/dL — ABNORMAL LOW (ref 8.9–10.3)
Chloride: 105 mmol/L (ref 98–111)
Creatinine: 0.71 mg/dL (ref 0.44–1.00)
GFR, Estimated: >60 mL/min (ref >60)
Glucose, Bld: 92 mg/dL (ref 70–99)
Potassium: 4.1 mmol/L (ref 3.5–5.1)
Sodium: 137 mmol/L (ref 135–145)
Total Bilirubin: 0.3 mg/dL (ref 0.3–1.2)
Total Protein: 7.2 g/dL (ref 6.5–8.1)

## 2022-12-05 LAB — CBC WITH DIFFERENTIAL (CANCER CENTER ONLY)
Abs Immature Granulocytes: 0.01 10*3/uL (ref 0.00–0.07)
Basophils Absolute: 0 10*3/uL (ref 0.0–0.1)
Basophils Relative: 1 %
Eosinophils Absolute: 0.1 10*3/uL (ref 0.0–0.5)
Eosinophils Relative: 4 %
HCT: 32.6 % — ABNORMAL LOW (ref 36.0–46.0)
Hemoglobin: 10.2 g/dL — ABNORMAL LOW (ref 12.0–15.0)
Immature Granulocytes: 0 %
Lymphocytes Relative: 34 %
Lymphs Abs: 1.1 10*3/uL (ref 0.7–4.0)
MCH: 29.7 pg (ref 26.0–34.0)
MCHC: 31.3 g/dL (ref 30.0–36.0)
MCV: 94.8 fL (ref 80.0–100.0)
Monocytes Absolute: 0.4 10*3/uL (ref 0.1–1.0)
Monocytes Relative: 14 %
Neutro Abs: 1.5 10*3/uL — ABNORMAL LOW (ref 1.7–7.7)
Neutrophils Relative %: 47 %
Platelet Count: 143 10*3/uL — ABNORMAL LOW (ref 150–400)
RBC: 3.44 MIL/uL — ABNORMAL LOW (ref 3.87–5.11)
RDW: 14.2 % (ref 11.5–15.5)
WBC Count: 3.1 10*3/uL — ABNORMAL LOW (ref 4.0–10.5)
nRBC: 0 % (ref 0.0–0.2)

## 2022-12-05 LAB — TOTAL PROTEIN, URINE DIPSTICK: Protein, ur: NEGATIVE mg/dL

## 2022-12-05 MED ORDER — SODIUM CHLORIDE 0.9 % IV SOLN: Freq: Once | INTRAVENOUS | Status: AC

## 2022-12-05 MED ORDER — LEUCOVORIN CALCIUM INJECTION 350 MG
400.0000 mg/m2 | Freq: Once | INTRAVENOUS | Status: AC
  Administered 2022-12-05: 600 mg via INTRAVENOUS
  Filled 2022-12-05: qty 30

## 2022-12-05 MED ORDER — SODIUM CHLORIDE 0.9% FLUSH
10.0000 mL | Freq: Once | INTRAVENOUS | Status: AC
  Administered 2022-12-05: 10 mL

## 2022-12-05 MED ORDER — SODIUM CHLORIDE 0.9 % IV SOLN
5.0000 mg/kg | Freq: Once | INTRAVENOUS | Status: AC
  Administered 2022-12-05: 250 mg via INTRAVENOUS
  Filled 2022-12-05: qty 10

## 2022-12-05 MED ORDER — SODIUM CHLORIDE 0.9% FLUSH
10.0000 mL | INTRAVENOUS | Status: DC | PRN
  Administered 2022-12-05: 10 mL

## 2022-12-05 MED ORDER — SODIUM CHLORIDE 0.9 % IV SOLN
1800.0000 mg/m2 | INTRAVENOUS | Status: DC
  Administered 2022-12-05: 2500 mg via INTRAVENOUS
  Filled 2022-12-05: qty 50

## 2022-12-05 MED ORDER — ONDANSETRON HCL 4 MG/2ML IJ SOLN
8.0000 mg | Freq: Once | INTRAMUSCULAR | Status: AC
  Administered 2022-12-05: 8 mg via INTRAVENOUS
  Filled 2022-12-05: qty 4

## 2022-12-05 NOTE — Patient Instructions (Signed)
[facility]  Discharge Instructions: Thank you for choosing [facility] to provide your oncology and hematology care.   If you have a lab appointment with the Cancer Center, please go directly to the Cancer Center and check in at the registration area.   Wear comfortable clothing and clothing appropriate for easy access to any Portacath or PICC line.   We strive to give you quality time with your provider. You may need to reschedule your appointment if you arrive late (15 or more minutes).  Arriving late affects you and other patients whose appointments are after yours.  Also, if you miss three or more appointments without notifying the office, you may be dismissed from the clinic at the provider's discretion.      For prescription refill requests, have your pharmacy contact our office and allow 72 hours for refills to be completed.    Today you received the following chemotherapy and/or immunotherapy agents: ZirabevLeucovorin/Fluorouracil      To help prevent nausea and vomiting after your treatment, we encourage you to take your nausea medication as directed.  BELOW ARE SYMPTOMS THAT SHOULD BE REPORTED IMMEDIATELY: *FEVER GREATER THAN 100.4 F (38 C) OR HIGHER *CHILLS OR SWEATING *NAUSEA AND VOMITING THAT IS NOT CONTROLLED WITH YOUR NAUSEA MEDICATION *UNUSUAL SHORTNESS OF BREATH *UNUSUAL BRUISING OR BLEEDING *URINARY PROBLEMS (pain or burning when urinating, or frequent urination) *BOWEL PROBLEMS (unusual diarrhea, constipation, pain near the anus) TENDERNESS IN MOUTH AND THROAT WITH OR WITHOUT PRESENCE OF ULCERS (sore throat, sores in mouth, or a toothache) UNUSUAL RASH, SWELLING OR PAIN  UNUSUAL VAGINAL DISCHARGE OR ITCHING   Items with * indicate a potential emergency and should be followed up as soon as possible or go to the Emergency Department if any problems should occur.  Please show the CHEMOTHERAPY ALERT CARD or  IMMUNOTHERAPY ALERT CARD at check-in to the Emergency Department and triage nurse.  Should you have questions after your visit or need to cancel or reschedule your appointment, please contact [facility]  Dept: [phone]  and follow the prompts.  Office hours are 8:00 a.m. to 4:30 p.m. Monday - Friday. Please note that voicemails left after 4:00 p.m. may not be returned until the following business day.  We are closed weekends and major holidays. You have access to a nurse at all times for urgent questions. Please call the main number to the clinic Dept: [phone] and follow the prompts.   For any non-urgent questions, you may also contact your provider using MyChart. We now offer e-Visits for anyone 34 and older to request care online for non-urgent symptoms. For details visit [email or web address].   Also download the MyChart app! Go to the app store, search "MyChart", open the app, select , and log in with your MyChart username and password.

## 2022-12-05 NOTE — Assessment & Plan Note (Signed)
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  -She began first-line chemo with dose-reduced FOLFIRI and Beva in 2/22.  -we changed to FOLFOX with continued Beva on 03/28/22. She tolerated well overall with some fatigue and cold sensitivity. -I personally reviewed her restaging CT from 10/07/2022 which showed overall SD in liver and rectum, stable and indermininate lung nodules, no other new lesions  -She is tolerating chemotherapy overall well, will continue. -I personally reviewed her restaging CT scan from October 07, 2022, which showed overall stable disease.  She has multiple small lung nodules, indeterminate, including one 7 mm nodule, will continue monitoring. 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  She preferred to stop oxaliplatin, will monitor her neuropathy closely. -we previously discussed changing 5-FU pump to oral Xeloda, she is not sure if she wants to do yet

## 2022-12-05 NOTE — Assessment & Plan Note (Signed)
diagnosed in 12/2015. S/p right hemicolectomy with right salpingo oophorectomy and adjuvant ChemoRT. 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 

## 2022-12-05 NOTE — Assessment & Plan Note (Signed)
-  Patient understands her cancer is not curable, and the goal of therapy is palliative -She has agreed with DNR 

## 2022-12-05 NOTE — Progress Notes (Signed)
[facility]   [phone] [phone]   Clinic Follow up Note   Patient Care Team: Patient, No Pcp Per as PCP - General (General Practice) [name], NP as Nurse Practitioner (Oncology) [name], MD as Consulting Physician (Oncology) [name], RN (Inactive) as Oncology Nurse Navigator  Date of Service:  12/05/2022  CHIEF COMPLAINT: f/u of metastatic rectal cancer, h/o colon cancer        CURRENT THERAPY:   FOLFOX with bevacizumab, q14d, starting 03/28/22    ASSESSMENT:  Brittney Spencer is a 72 y.o. 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  -She began first-line chemo with dose-reduced FOLFIRI and Beva in 2/22.  -we changed to FOLFOX with continued Beva on 03/28/22. She tolerated well overall with some fatigue and cold sensitivity. -I personally reviewed her restaging CT from 10/07/2022 which showed overall SD in liver and rectum, stable and indermininate lung nodules, no other new lesions  -She is tolerating chemotherapy overall well, will continue. -I personally reviewed her restaging CT scan from October 07, 2022, which showed overall stable disease.  She has multiple small lung nodules, indeterminate, including one 7 mm nodule, will continue monitoring. 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  She preferred to stop oxaliplatin, will monitor her neuropathy closely. -we previously discussed changing 5-FU pump to oral Xeloda, she is not sure if she wants to do yet  Cancer of ascending colon (HCC) diagnosed in 12/2015. S/p right hemicolectomy with right salpingo oophorectomy and adjuvant ChemoRT. -Her 04/2019 CT scan was NED  -With rectal cancer diagnosis in 2022, her PET from  07/12/20 indicates soft tissue mass within the right iliac fossa is noted and consistent with local tumor recurrence from previous ascending colon tumor.  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 -Worse lately, with tingling in hands and feet -Will stop oxaliplatin today -I recommend her to take over-the-counter B12      Iron deficiency anemia due to chronic blood loss -will monitor her iron level and give iv iron as needed       PLAN: -lab reviewed, will proceed with 5FU and Beva today and continue every 2 weeks -Postpone treatment the week of 7/17  to 8/8 due to trips -I order CT CAP for 7/1 -lab/flush and treatment 6/20     SUMMARY OF ONCOLOGIC HISTORY: Oncology History Overview Note  Cancer Staging Cancer of ascending colon [facility]) Staging form: Colon and Rectum, AJCC 7th Edition - Clinical stage from 02/07/2016: Stage IIIC (T4b, N1b, M0) - Signed by [name], MD on 03/04/2016 Laterality: Right Residual tumor (R): R2 - Macroscopic    Cancer of ascending colon (HCC)  10/25/2015 Imaging   CT ABD/PELVIS:  Inflammatory changes inferior to the cecal tip appear improved, there is still irregular soft tissue thickening of the cecal tip, and there are adjacent prominent lymph nodes in the ileocolonic mesentery, measuring 13 mm on image 49 and 8 mm on image 52. In addition, there is a 2.5 x 1.8 cm nodule on image 46 which has central low density. Therefore, these  findings are moderately suspicious for an underlying cecal malignancy with perforation.    01/19/2016 Procedure   COLONOSCOPY per Dr. [name]: Fungating, ulcerated mass almost obstructing mid ascending colon   01/19/2016 Initial Biopsy   Diagnosis Surgical [P], cecal mass - INVASIVE ADENOCARCINOMA WITH ULCERATION. - SEE COMMENT.   02/05/2016  Tumor Marker   Patient's tumor was tested for the following markers: CEA Results of the tumor marker test revealed 5.7.   02/07/2016 Initial Diagnosis   Cancer of ascending colon (HCC)   02/07/2016 Definitive Surgery   Laparoscopic assisted right hemicolectomy and right salpingo oopherectomy--[name]   02/07/2016 Pathologic Stage   p T4 N1b   2/43 nodes +   02/07/2016 Pathology Results   MMR normal; G2 adenocarcinoma;proximal & distal margins negative; soft tissue mass on pelvic sidewall + for adenocarcinoma with positive margin MSI Stable   03/08/2016 Imaging   CT chest negative for metastasis.    03/19/2016 - 04/25/2016 Radiation Therapy   Adjuvant irradiation, 50 gray in 28 fractions   03/19/2016 - 04/22/2016 Chemotherapy   Xeloda 1500 mg twice daily, started on 03/19/2016, dose reduced to 1000 mg twice daily from week 3 due to neutropenia, and patient stopped 3 days before last dose radiation due to difficulty swallowing the pill    05/20/2016 -  Adjuvant Chemotherapy   Patient declined adjuvant chemotherapy   09/16/2016 Imaging   CT CAP w Contrast 1. No evidence of local tumor recurrence at the ileocolic anastomosis. 2. No findings suspicious for metastatic disease in the chest, abdomen or pelvis. 3. Nonspecific trace free fluid in the pelvic cul-de-sac. 4. Stable solitary 3 mm right upper lobe pulmonary nodule, for which 6 month stability has been demonstrated, probably benign. 5. Additional findings include stable right posterior pericardial cyst and small calcified uterine fibroids.   05/13/2017 Imaging   CT CAP W Contrast 05/13/17 IMPRESSION: 1. No current findings of residual or recurrent malignancy. 2. Mild prominence of stool throughout the colon. Nondistended portions of the rectum. 3. Several tiny pulmonary nodules are stable from the earliest available comparison of 03/08/2016 and probably benign, but may merit surveillance. 4. Other imaging findings of  potential clinical significance: Old granulomatous disease. Aortoiliac atherosclerotic vascular disease. Lumbar spondylosis and degenerative disc disease. Stable amount of trace free pelvic fluid.   04/27/2018 Imaging   04/27/2018 CT CAP IMPRESSION: Stable exam. No evidence of recurrent or metastatic carcinoma within the chest, abdomen, or pelvis   04/19/2019 Imaging   CT CAP W Contrast  IMPRESSION: Chest Impression:   1. No evidence of thoracic metastasis. 2. Stable small bilateral pulmonary nodules.   Abdomen / Pelvis Impression:   1. No evidence local colorectal carcinoma recurrence or metastasis in the abdomen pelvis. 2. Post RIGHT hemicolectomy.   01/22/2021 Imaging   CT CAP  IMPRESSION: CT CHEST IMPRESSION   1. Similar nonspecific pulmonary nodules. 2. New posterior left upper lobe reticulonodular opacity, suspicious for interval mild infection or inflammation. 3. No thoracic adenopathy.   CT ABDOMEN AND PELVIS IMPRESSION   1. Further decrease in size of high left hepatic lobe 3 mm low-density lesion. No new or progressive metastatic disease within the abdomen or pelvis. 2. Similar trace free pelvic fluid. 3. Similar nonspecific mid rectal wall thickening. 4.  Aortic Atherosclerosis (ICD10-I70.0).   04/23/2021 Imaging   CT CAP  IMPRESSION: 1. Treated metastatic lesion between segments 2 and 3 of the liver, slightly smaller and less distinct than prior examination. No other signs of definite metastatic disease  elsewhere in the abdomen or pelvis. 2. Multiple small pulmonary nodules, stable compared to the prior examination, favored to be benign. No definitive findings to suggest metastatic disease to the thorax. 3. Aortic atherosclerosis. 4. Additional incidental findings, as above.   08/13/2021 Imaging   EXAM: CT CHEST, ABDOMEN, AND PELVIS WITH CONTRAST  IMPRESSION: 1. A previously seen PET avid lesion of the anterior left lobe of the liver, hepatic  segment II, is no longer discretely appreciable consistent with treatment response of a hepatic metastasis. 2. No evidence of new metastatic disease in the chest, abdomen, or pelvis. 3. Interval increase in a small focus of consolidation and nodularity of the medial left upper lobe, consistent with minimal, ongoing atypical infection. Additional tiny bilateral pulmonary nodules are stable and almost certainly incidental benign. Attention on follow-up. 4. Status post right hemicolectomy and ileocolic anastomosis.   07/02/2022 Imaging    IMPRESSION: CHEST IMPRESSION:   1. No evidence of thoracic metastasis. 2. Stable small pulmonary nodules.   PELVIS IMPRESSION:   1. Stable to slight decrease in size of subcapsular lesion in the RIGHT hepatic lobe. 2. No evidence of new or progressive disease in the abdomen pelvis. 3.  Aortic Atherosclerosis (ICD10-I70.0).     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)  06/15/2020 Procedure   Screening Colonoscopy by Dr [name]  IMPRESSION - Decreased sphincter tone and internal hemorrhoids that prolapse with straining, but require manual replacement into the anal canal (Grade III) found on digital rectal exam. - Patent side-to-side ileo-colonic anastomosis, characterized by healthy appearing mucosa. - The examined portion of the ileum was normal. - One diminutive polyp in the proximal transverse colon, removed with a cold biopsy forceps. Resected and retrieved. - Likely malignant partially obstructing tumor in the mid rectum. Biopsied. Tattooed. - The examination was otherwise normal on direct and retroflexion views.   06/15/2020 Initial Biopsy   Diagnosis 1. Transverse Colon Polyp - HYPERPLASTIC POLYP 2. Rectum, biopsy - ADENOCARCINOMA ARISING IN A TUBULAR ADENOMA WITH HIGH-GRADE DYSPLASIA. SEE NOTE Diagnosis Note 2. Dr. Luisa Hart reviewed the case and concurs with the diagnosis. Dr. [name] was notified on 06/16/2020.   06/28/2020 Imaging    CT CAP  IMPRESSION: 1. New low-density focus in the anterior aspect of the lateral segment LEFT hepatic lobe measuring 1.2 x 1.0 cm, compatible with small metastatic lesion in the LEFT hepatic lobe. 2. Soft tissue in the RIGHT iliac fossa following RIGHT hemicolectomy invades the psoas musculature and is slowly enlarging over time, more linear on the prior study now highly concerning for recurrence/metastasis to this location. 3. Signs of enteritis, potentially post radiation changes of the small bowel. Tethered small bowel in the RIGHT lower quadrant shows focal thickening and narrowing suspicious for small bowel involvement and developing partial obstruction though currently contrast passes beyond this point into the colon. 4. Rectal thickening in this patient with known rectal mass as described. 5. No evidence of metastatic disease in the chest. 6. Stable small pulmonary nodules. 7.  and aortic atherosclerosis.   Aortic Atherosclerosis (ICD10-I70.0) and Emphysema (ICD10-J43.9).   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 Exam positive for FDG avid rectal tumor which corresponds to the recent colonoscopy findings. 2. FDG avid soft tissue mass within the right iliac fossa is noted and consistent with local tumor recurrence from previous ascending colon tumor. 3. Lateral segment left lobe of liver lesion is FDG avid concerning for liver metastasis. 4.  No specific findings identified to suggest metastatic disease to the chest.   08/10/2020 -  Chemotherapy   First-line FOLFIRI q2weeks starting 08/10/20. dose reduced with cycle 1. Irinotecan/5FU increased and Bevacizumab added with cycle 2 on 08/23/2020    08/17/2020 - 03/09/2022 Chemotherapy   Patient is on Treatment Plan : COLORECTAL FOLFIRI + Bevacizumab q14d     10/27/2020 Imaging   CT CAP  IMPRESSION: 1. Interval decrease in size of the hypermetabolic left  hepatic lesion, consistent with metastatic disease. No new liver lesion evident. 2. Interval resolution of the hypermetabolic soft tissue lesion along the right iliac fossa with no measurable soft tissue lesion remaining at this location today. 3. Similar appearance of soft tissue fullness in the rectum at the site of the hypermetabolic lesion seen previously. 4. Stable tiny bilateral pulmonary nodules. Continued attention on follow-up recommended. 5. Small volume free fluid in the pelvis. 6. Aortic Atherosclerosis (ICD10-I70.0).   01/22/2021 Imaging   CT CAP  IMPRESSION: CT CHEST IMPRESSION   1. Similar nonspecific pulmonary nodules. 2. New posterior left upper lobe reticulonodular opacity, suspicious for interval mild infection or inflammation. 3. No thoracic adenopathy.   CT ABDOMEN AND PELVIS IMPRESSION   1. Further decrease in size of high left hepatic lobe 3 mm low-density lesion. No new or progressive metastatic disease within the abdomen or pelvis. 2. Similar trace free pelvic fluid. 3. Similar nonspecific mid rectal wall thickening. 4.  Aortic Atherosclerosis (ICD10-I70.0).   04/23/2021 Imaging   CT CAP  IMPRESSION: 1. Treated metastatic lesion between segments 2 and 3 of the liver, slightly smaller and less distinct than prior examination. No other signs of definite metastatic disease elsewhere in the abdomen or pelvis. 2. Multiple small pulmonary nodules, stable compared to the prior examination, favored to be benign. No definitive findings to suggest metastatic disease to the thorax. 3. Aortic atherosclerosis. 4. Additional incidental findings, as above.   08/13/2021 Imaging   EXAM: CT CHEST, ABDOMEN, AND PELVIS WITH CONTRAST  IMPRESSION: 1. A previously seen PET avid lesion of the anterior left lobe of the liver, hepatic segment II, is no longer discretely appreciable consistent with treatment response of a hepatic metastasis. 2. No evidence of new  metastatic disease in the chest, abdomen, or pelvis. 3. Interval increase in a small focus of consolidation and nodularity of the medial left upper lobe, consistent with minimal, ongoing atypical infection. Additional tiny bilateral pulmonary nodules are stable and almost certainly incidental benign. Attention on follow-up. 4. Status post right hemicolectomy and ileocolic anastomosis.   03/28/2022 -  Chemotherapy   Patient is on Treatment Plan : COLORECTAL FOLFOX + Bevacizumab q14d     07/02/2022 Imaging    IMPRESSION: CHEST IMPRESSION:   1. No evidence of thoracic metastasis. 2. Stable small pulmonary nodules.   PELVIS IMPRESSION:   1. Stable to slight decrease in size of subcapsular lesion in the RIGHT hepatic lobe. 2. No evidence of new or progressive disease in the abdomen pelvis.   10/07/2022 Imaging    IMPRESSION: 1. Stable hypovascular mass in the periphery of the right lobe of the liver, which likely represents a treated metastatic lesion. Stable subcentimeter lesion just medial to this is also similar to the recent prior examination. No new hepatic lesions are otherwise noted. 2. Persistent but stable poorly defined soft tissue thickening in the right lower quadrant associated with multiple small bowel loops and the overlying iliopsoas musculature, which corresponds to focal hypermetabolism on remote prior PET-CT. Given the stability, this  likely represents a treated metastatic lesion. Continued attention on follow-up studies is recommended. 3. Persistent mass-like thickening in the proximal rectum corresponding to previously noted hypermetabolic rectal neoplasm on prior PET-CT. This currently measures approximately 2.8 x 2.3 cm and is slightly more apparent than the most recent prior study. 4. Multiple small pulmonary nodules generally stable compared to the prior study, with exception of a new branching nodule in the anterior aspect of the left upper lobe measuring  7 x 3 mm (mean diameter 5 mm). This is nonspecific. Close attention on follow-up studies is recommended to ensure stability. 5. Aortic atherosclerosis. 6. Additional incidental findings, as above.        INTERVAL HISTORY:  Brittney Spencer is here for a follow up of metastatic rectal cancer, h/o colon cancer . She was last seen by me on 11/14/2022. She presents to the clinic alone. Pt state that she is starting to notice the numbness and tingling in her finger tips.She state that she dropped a  napkin and she asked for assistant. She also reports of  not having as much numbness and tingling in her feet as she use to have . Pt appetite is good.    All other systems were reviewed with the patient and are negative.  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 Some residual intraretinal fluid remains, very small localized subfoveal of fluid remains although this continues to slowly resorb. We'll continue to observe.   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  ALLERGIES:  is allergic to venofer [iron sucrose], fish allergy, peanut-containing drug products, soy allergy, and buspirone.  MEDICATIONS:  Current Outpatient Medications  Medication Sig Dispense Refill   ALPRAZolam (XANAX) 0.25 MG tablet Take 1 tablet (0.25 mg total) by mouth daily as needed for anxiety. 30 tablet 0   capecitabine (XELODA) 500 MG tablet Take 3 tablets (1,500 mg total) by mouth 2 (two) times daily after a meal. Take within 30 minutes after meals. Take for 14 days on, then off for 7 days. Repeat every 21 days. 84 tablet 1   Cholecalciferol (VITAMIN D3 PO) Take by mouth daily.     docusate sodium (COLACE) 100 MG capsule 1 capsule as needed  lidocaine-prilocaine (EMLA) cream Apply 1 Application topically as needed. 30 g 1   mirtazapine (REMERON) 7.5 MG tablet Take 1 tablet (7.5 mg total) by mouth at bedtime. 30 tablet 0   NORVASC 2.5 MG tablet TAKE 1 TABLET BY MOUTH EVERY DAY 90 tablet 1   ondansetron (ZOFRAN) 8 MG tablet Take 1 tablet (8 mg total) by mouth every 8 (eight) hours as needed for nausea or vomiting. 20 tablet 2   prochlorperazine (COMPAZINE) 10 MG tablet Take 1 tablet (10 mg total) by mouth every 6 (six) hours as needed for nausea or vomiting. 30 tablet 2   Current Facility-Administered Medications  Medication Dose Route Frequency Provider Last Rate Last Admin   0.9 %  sodium chloride infusion  500 mL Intravenous Once Sherrilyn Rist, MD       Facility-Administered Medications Ordered in Other Visits  Medication Dose Route Frequency Provider Last Rate Last Admin   fluorouracil (ADRUCIL) 2,500 mg in sodium chloride 0.9 % 100 mL chemo infusion  1,800 mg/m2 (Order-Specific) Intravenous 1 day or 1 dose [name], MD    Infusion Verify at 12/05/22 1208   sodium chloride flush (NS) 0.9 % injection 10 mL  10 mL Intracatheter PRN [name], MD   10 mL at 12/05/22 1156    PHYSICAL EXAMINATION: ECOG PERFORMANCE STATUS: 1 - Symptomatic but completely ambulatory  Vitals:   12/05/22 0946  BP: (!) 147/83  Pulse: 85  Temp: 98.1 F (36.7 C)  SpO2: 100%   Wt Readings from Last 3 Encounters:  12/05/22 102 lb 14.4 oz (46.7 kg)  11/14/22 101 lb 4.8 oz (45.9 kg)  10/31/22 105 lb 8 oz (47.9 kg)     GENERAL:alert, no distress and comfortable SKIN: skin color normal, no rashes or significant lesions EYES: normal, Conjunctiva are pink and non-injected, sclera clear  NEURO: alert & oriented x 3 with fluent speech LABORATORY DATA:  I have reviewed the data as listed    Latest Ref Rng & Units 12/05/2022    9:21 AM 11/14/2022    7:42 AM 10/31/2022    8:53 AM  CBC  WBC 4.0 - 10.5 K/uL 3.1  4.7  5.6   Hemoglobin 12.0 - 15.0 g/dL 16.1  9.9  9.5   Hematocrit 36.0 - 46.0 % 32.6  32.1  30.6   Platelets 150 - 400 K/uL 143  92  90         Latest Ref Rng & Units 12/05/2022    9:21 AM 11/14/2022    7:42 AM 10/31/2022    8:53 AM  CMP  Glucose 70 - 99 mg/dL 92  86  88   BUN 8 - 23 mg/dL 11  14  10    Creatinine 0.44 - 1.00 mg/dL 0.96  0.45  4.09   Sodium 135 - 145 mmol/L 137  137  138   Potassium 3.5 - 5.1 mmol/L 4.1  4.2  4.4   Chloride 98 - 111 mmol/L 105  105  107   CO2 22 - 32 mmol/L 28  27  26    Calcium 8.9 - 10.3 mg/dL 8.7  8.6  8.6   Total Protein 6.5 - 8.1 g/dL 7.2  6.8  6.8   Total Bilirubin 0.3 - 1.2 mg/dL 0.3  0.3  0.2   Alkaline Phos 38 - 126 U/L 82  81  90   AST 15 - 41 U/L 22  17  22    ALT 0 - 44 U/L 12  8  13       RADIOGRAPHIC STUDIES: I have personally reviewed the radiological images as listed and agreed with the findings in the report. No results found.    Orders Placed This Encounter  Procedures   CT CHEST ABDOMEN PELVIS W CONTRAST    Standing Status:   Future    Standing Expiration  Date:   12/05/2023    Order Specific Question:   If indicated for the ordered procedure, I authorize the administration of contrast media per Radiology protocol    Answer:   Yes    Order Specific Question:   Does the patient have a contrast media/X-ray dye allergy?    Answer:   No    Order Specific Question:   Preferred imaging location?    Answer:   [facility]    Order Specific Question:   Release to patient    Answer:   Immediate    Order Specific Question:   If indicated for the ordered procedure, I authorize the administration of oral contrast media per Radiology protocol    Answer:   Yes   CBC with Differential (Cancer Center Only)    Standing Status:   Future    Standing Expiration Date:   12/20/2023   CMP (Cancer Center only)    Standing Status:   Future    Standing Expiration Date:   12/20/2023   Total Protein, Urine dipstick    Standing Status:   Future    Standing Expiration Date:   12/20/2023   CBC with Differential (Cancer Center Only)    Standing Status:   Future    Standing Expiration Date:   01/02/2024   CMP (Cancer Center only)    Standing Status:   Future    Standing Expiration Date:   01/02/2024   Total Protein, Urine dipstick    Standing Status:   Future    Standing Expiration Date:   01/02/2024   CBC with Differential (Cancer Center Only)    Standing Status:   Future    Standing Expiration Date:   02/07/2024   CMP (Cancer Center only)    Standing Status:   Future    Standing Expiration Date:   02/07/2024   Total Protein, Urine dipstick    Standing Status:   Future    Standing Expiration Date:   02/07/2024   All questions were answered. The patient knows to call the clinic with any problems, questions or concerns. No barriers to learning was detected. The total time spent in the appointment was 25 minutes.     [name], MD 12/05/2022   Carolin Coy, CMA, am acting as scribe for [name], MD.   I have reviewed the above documentation for accuracy and  completeness, and I agree with the above.

## 2022-12-05 NOTE — Assessment & Plan Note (Signed)
-  will monitor her iron level and give iv iron as needed 

## 2022-12-05 NOTE — Assessment & Plan Note (Signed)
-  Secondary to oxaliplatin -Worse lately, with tingling in hands and feet -Will stop oxaliplatin today -I recommend her to take over-the-counter B12   

## 2022-12-06 ENCOUNTER — Other Ambulatory Visit: Payer: Self-pay

## 2022-12-07 ENCOUNTER — Inpatient Hospital Stay: Payer: Federal, State, Local not specified - PPO

## 2022-12-07 VITALS — BP 137/90 | HR 98 | Resp 18

## 2022-12-07 DIAGNOSIS — C787 Secondary malignant neoplasm of liver and intrahepatic bile duct: Secondary | ICD-10-CM

## 2022-12-07 DIAGNOSIS — Z5112 Encounter for antineoplastic immunotherapy: Secondary | ICD-10-CM | POA: Diagnosis not present

## 2022-12-07 DIAGNOSIS — Z95828 Presence of other vascular implants and grafts: Secondary | ICD-10-CM

## 2022-12-07 DIAGNOSIS — C20 Malignant neoplasm of rectum: Secondary | ICD-10-CM

## 2022-12-07 MED ORDER — SODIUM CHLORIDE 0.9% FLUSH
10.0000 mL | Freq: Once | INTRAVENOUS | Status: AC
  Administered 2022-12-07: 10 mL

## 2022-12-07 MED ORDER — HEPARIN SOD (PORK) LOCK FLUSH 100 UNIT/ML IV SOLN
500.0000 [IU] | Freq: Once | INTRAVENOUS | Status: AC
  Administered 2022-12-07: 500 [IU]

## 2022-12-07 MED ORDER — PEGFILGRASTIM-JMDB 6 MG/0.6ML ~~LOC~~ SOSY
6.0000 mg | PREFILLED_SYRINGE | Freq: Once | SUBCUTANEOUS | Status: AC
  Administered 2022-12-07: 6 mg via SUBCUTANEOUS
  Filled 2022-12-07: qty 0.6

## 2022-12-09 ENCOUNTER — Other Ambulatory Visit: Payer: Self-pay | Admitting: Hematology

## 2022-12-18 ENCOUNTER — Inpatient Hospital Stay (HOSPITAL_BASED_OUTPATIENT_CLINIC_OR_DEPARTMENT_OTHER): Payer: Federal, State, Local not specified - PPO | Admitting: Hematology

## 2022-12-18 ENCOUNTER — Inpatient Hospital Stay: Payer: Federal, State, Local not specified - PPO

## 2022-12-18 ENCOUNTER — Encounter: Payer: Self-pay | Admitting: Hematology

## 2022-12-18 ENCOUNTER — Other Ambulatory Visit: Payer: Self-pay

## 2022-12-18 VITALS — BP 142/94 | HR 93 | Temp 98.4°F | Resp 18 | Ht 66.0 in | Wt 102.7 lb

## 2022-12-18 DIAGNOSIS — C20 Malignant neoplasm of rectum: Secondary | ICD-10-CM | POA: Diagnosis not present

## 2022-12-18 DIAGNOSIS — Z5112 Encounter for antineoplastic immunotherapy: Secondary | ICD-10-CM | POA: Diagnosis not present

## 2022-12-18 DIAGNOSIS — Z95828 Presence of other vascular implants and grafts: Secondary | ICD-10-CM

## 2022-12-18 DIAGNOSIS — C787 Secondary malignant neoplasm of liver and intrahepatic bile duct: Secondary | ICD-10-CM | POA: Diagnosis not present

## 2022-12-18 DIAGNOSIS — D500 Iron deficiency anemia secondary to blood loss (chronic): Secondary | ICD-10-CM

## 2022-12-18 LAB — CMP (CANCER CENTER ONLY)
ALT: 8 U/L (ref 0–44)
AST: 16 U/L (ref 15–41)
Albumin: 3.4 g/dL — ABNORMAL LOW (ref 3.5–5.0)
Alkaline Phosphatase: 91 U/L (ref 38–126)
Anion gap: 4 — ABNORMAL LOW (ref 5–15)
BUN: 11 mg/dL (ref 8–23)
CO2: 29 mmol/L (ref 22–32)
Calcium: 9.1 mg/dL (ref 8.9–10.3)
Chloride: 104 mmol/L (ref 98–111)
Creatinine: 0.71 mg/dL (ref 0.44–1.00)
GFR, Estimated: >60 mL/min (ref >60)
Glucose, Bld: 93 mg/dL (ref 70–99)
Potassium: 4.3 mmol/L (ref 3.5–5.1)
Sodium: 137 mmol/L (ref 135–145)
Total Bilirubin: 0.4 mg/dL (ref 0.3–1.2)
Total Protein: 6.7 g/dL (ref 6.5–8.1)

## 2022-12-18 LAB — TOTAL PROTEIN, URINE DIPSTICK: Protein, ur: NEGATIVE mg/dL

## 2022-12-18 LAB — CBC WITH DIFFERENTIAL (CANCER CENTER ONLY)
Abs Immature Granulocytes: 0.02 10*3/uL (ref 0.00–0.07)
Basophils Absolute: 0 10*3/uL (ref 0.0–0.1)
Basophils Relative: 0 %
Eosinophils Absolute: 0.2 10*3/uL (ref 0.0–0.5)
Eosinophils Relative: 4 %
HCT: 33.8 % — ABNORMAL LOW (ref 36.0–46.0)
Hemoglobin: 10.6 g/dL — ABNORMAL LOW (ref 12.0–15.0)
Immature Granulocytes: 0 %
Lymphocytes Relative: 19 %
Lymphs Abs: 0.9 10*3/uL (ref 0.7–4.0)
MCH: 29.9 pg (ref 26.0–34.0)
MCHC: 31.4 g/dL (ref 30.0–36.0)
MCV: 95.5 fL (ref 80.0–100.0)
Monocytes Absolute: 0.4 10*3/uL (ref 0.1–1.0)
Monocytes Relative: 9 %
Neutro Abs: 3.3 10*3/uL (ref 1.7–7.7)
Neutrophils Relative %: 68 %
Platelet Count: 109 10*3/uL — ABNORMAL LOW (ref 150–400)
RBC: 3.54 MIL/uL — ABNORMAL LOW (ref 3.87–5.11)
RDW: 13.5 % (ref 11.5–15.5)
WBC Count: 4.8 10*3/uL (ref 4.0–10.5)
nRBC: 0 % (ref 0.0–0.2)

## 2022-12-18 LAB — CEA (IN HOUSE-CHCC): CEA (CHCC-In House): 51.44 ng/mL — ABNORMAL HIGH (ref 0.00–5.00)

## 2022-12-18 LAB — FERRITIN: Ferritin: 105 ng/mL (ref 11–307)

## 2022-12-18 MED ORDER — HEPARIN SOD (PORK) LOCK FLUSH 100 UNIT/ML IV SOLN: 500.0000 [IU] | Freq: Once | INTRAVENOUS | Status: DC | PRN

## 2022-12-18 MED ORDER — ONDANSETRON HCL 4 MG/2ML IJ SOLN
8.0000 mg | Freq: Once | INTRAMUSCULAR | Status: AC
  Administered 2022-12-18: 8 mg via INTRAVENOUS
  Filled 2022-12-18: qty 4

## 2022-12-18 MED ORDER — DEXTROSE 5 % IV SOLN: Freq: Once | INTRAVENOUS | Status: DC

## 2022-12-18 MED ORDER — SODIUM CHLORIDE 0.9 % IV SOLN: Freq: Once | INTRAVENOUS | Status: AC

## 2022-12-18 MED ORDER — SODIUM CHLORIDE 0.9% FLUSH
10.0000 mL | Freq: Once | INTRAVENOUS | Status: AC
  Administered 2022-12-18: 10 mL

## 2022-12-18 MED ORDER — SODIUM CHLORIDE 0.9 % IV SOLN
1800.0000 mg/m2 | INTRAVENOUS | Status: DC
  Administered 2022-12-18: 2500 mg via INTRAVENOUS
  Filled 2022-12-18: qty 50

## 2022-12-18 MED ORDER — SODIUM CHLORIDE 0.9% FLUSH
10.0000 mL | INTRAVENOUS | Status: DC | PRN
  Administered 2022-12-18: 10 mL

## 2022-12-18 MED ORDER — LEUCOVORIN CALCIUM INJECTION 350 MG
400.0000 mg/m2 | Freq: Once | INTRAVENOUS | Status: AC
  Administered 2022-12-18: 600 mg via INTRAVENOUS
  Filled 2022-12-18: qty 30

## 2022-12-18 MED ORDER — SODIUM CHLORIDE 0.9 % IV SOLN
5.0000 mg/kg | Freq: Once | INTRAVENOUS | Status: AC
  Administered 2022-12-18: 250 mg via INTRAVENOUS
  Filled 2022-12-18: qty 10

## 2022-12-18 NOTE — Progress Notes (Signed)
[facility]   [phone] [phone]   Clinic Follow up Note    Patient Care Team: Patient, No Pcp Per as PCP - General (General Practice) [name], NP as Nurse Practitioner (Oncology) [name], MD as Consulting Physician (Oncology) [name], RN (Inactive) as Oncology Nurse Navigator   Date of Service:  12/18/2022   CHIEF COMPLAINT: f/u of metastatic rectal cancer, h/o colon cancer         CURRENT THERAPY:   FOLFOX with bevacizumab, q14d, starting 03/28/22      ASSESSMENT:  Brittney Spencer is a 72 y.o. 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  -She began first-line chemo with dose-reduced FOLFIRI and Beva in 2/22.  -we changed to FOLFOX with continued Beva on 03/28/22. She tolerated well overall with some fatigue and cold sensitivity. -I personally reviewed her restaging CT from 10/07/2022 which showed overall SD in liver and rectum, stable and indermininate lung nodules, no other new lesions  -She is tolerating chemotherapy overall well, will continue. -I personally reviewed her restaging CT scan from October 07, 2022, which showed overall stable disease.  She has multiple small lung nodules, indeterminate, including one 7 mm nodule, will continue monitoring. -She has developed neuropathy in her hands and feet, grade 2, we stopped oxaliplatin in 09/2022 -we previously discussed changing 5-FU pump to oral Xeloda, she is not sure if she wants to do yet   Cancer of ascending colon (HCC) diagnosed in 12/2015. S/p right hemicolectomy with right salpingo oophorectomy and adjuvant ChemoRT. -Her 04/2019 CT scan was NED  -With rectal cancer diagnosis in 2022, her PET from 07/12/20 indicates soft tissue mass within the right iliac fossa is noted and consistent with local tumor  recurrence from previous ascending colon tumor.   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 which we stopped in 09/2022 -stable overall  -I recommend her to take over-the-counter B12       Iron deficiency anemia due to chronic blood loss -will monitor her iron level and give iv iron as needed           PLAN: - referral for PT for her neuropathy  - next treatment and f/u is July 3 - CT July 1 - reviewed labs, will  proceed to treatment today      SUMMARY OF ONCOLOGIC HISTORY:     Oncology History Overview Note   Cancer Staging Cancer of ascending colon [facility]) Staging form: Colon and Rectum, AJCC 7th Edition - Clinical stage from 02/07/2016: Stage IIIC (T4b, N1b, M0) - Signed by [name], MD on 03/04/2016 Laterality: Right Residual tumor (R): R2 - Macroscopic      Cancer of ascending colon (HCC)   10/25/2015 Imaging     CT ABD/PELVIS:  Inflammatory changes inferior to the cecal tip appear improved, there is still irregular soft tissue thickening of the cecal tip, and there are adjacent prominent lymph nodes in the ileocolonic mesentery, measuring 13 mm on image 49 and 8 mm on image 52. In addition, there is a 2.5 x 1.8 cm nodule on image 46 which has central low density. Therefore, these findings are  moderately suspicious for an underlying cecal malignancy with perforation.      01/19/2016 Procedure     COLONOSCOPY per Dr. [name]: Fungating, ulcerated mass almost obstructing mid ascending colon     01/19/2016 Initial Biopsy     Diagnosis Surgical [P], cecal mass - INVASIVE ADENOCARCINOMA WITH ULCERATION. - SEE COMMENT.     02/05/2016 Tumor Marker     Patient's tumor was tested for the following markers: CEA Results of the tumor marker test revealed 5.7.     02/07/2016 Initial Diagnosis     Cancer  of ascending colon (HCC)     02/07/2016 Definitive Surgery     Laparoscopic assisted right hemicolectomy and right salpingo oopherectomy--[name]     02/07/2016 Pathologic Stage     p T4 N1b   2/43 nodes +     02/07/2016 Pathology Results     MMR normal; G2 adenocarcinoma;proximal & distal margins negative; soft tissue mass on pelvic sidewall + for adenocarcinoma with positive margin MSI Stable     03/08/2016 Imaging     CT chest negative for metastasis.      03/19/2016 - 04/25/2016 Radiation Therapy     Adjuvant irradiation, 50 gray in 28 fractions     03/19/2016 - 04/22/2016 Chemotherapy     Xeloda 1500 mg twice daily, started on 03/19/2016, dose reduced to 1000 mg twice daily from week 3 due to neutropenia, and patient stopped 3 days before last dose radiation due to difficulty swallowing the pill       05/20/2016 -  Adjuvant Chemotherapy     Patient declined adjuvant chemotherapy     09/16/2016 Imaging     CT CAP w Contrast 1. No evidence of local tumor recurrence at the ileocolic anastomosis. 2. No findings suspicious for metastatic disease in the chest, abdomen or pelvis. 3. Nonspecific trace free fluid in the pelvic cul-de-sac. 4. Stable solitary 3 mm right upper lobe pulmonary nodule, for which 6 month stability has been demonstrated, probably benign. 5. Additional findings include stable right posterior pericardial cyst and small calcified uterine fibroids.     05/13/2017 Imaging     CT CAP W Contrast 05/13/17 IMPRESSION: 1. No current findings of residual or recurrent malignancy. 2. Mild prominence of stool throughout the colon. Nondistended portions of the rectum. 3. Several tiny pulmonary nodules are stable from the earliest available comparison of 03/08/2016 and probably benign, but may merit surveillance. 4. Other imaging findings of potential clinical significance: Old granulomatous disease. Aortoiliac atherosclerotic vascular disease. Lumbar spondylosis and  degenerative disc disease. Stable amount of trace free pelvic fluid.     04/27/2018 Imaging     04/27/2018 CT CAP IMPRESSION: Stable exam. No evidence of recurrent or metastatic carcinoma within the chest, abdomen, or pelvis     04/19/2019 Imaging     CT CAP W Contrast  IMPRESSION: Chest Impression:   1. No evidence of thoracic metastasis. 2. Stable small bilateral pulmonary nodules.   Abdomen / Pelvis Impression:   1. No evidence local colorectal carcinoma recurrence or metastasis in the abdomen pelvis. 2. Post RIGHT hemicolectomy.     01/22/2021 Imaging     CT CAP   IMPRESSION: CT CHEST IMPRESSION   1. Similar nonspecific pulmonary nodules. 2. New posterior left upper lobe reticulonodular opacity, suspicious for interval mild infection or inflammation. 3. No thoracic adenopathy.   CT ABDOMEN AND PELVIS IMPRESSION   1. Further decrease in size of high left hepatic lobe 3 mm low-density lesion. No new  or progressive metastatic disease within the abdomen or pelvis. 2. Similar trace free pelvic fluid. 3. Similar nonspecific mid rectal wall thickening. 4.  Aortic Atherosclerosis (ICD10-I70.0).     04/23/2021 Imaging     CT CAP   IMPRESSION: 1. Treated metastatic lesion between segments 2 and 3 of the liver, slightly smaller and less distinct than prior examination. No other signs of definite metastatic disease elsewhere in the abdomen or pelvis. 2. Multiple small pulmonary nodules, stable compared to the prior examination, favored to be benign. No definitive findings to suggest metastatic disease to the thorax. 3. Aortic atherosclerosis. 4. Additional incidental findings, as above.     08/13/2021 Imaging     EXAM: CT CHEST, ABDOMEN, AND PELVIS WITH CONTRAST   IMPRESSION: 1. A previously seen PET avid lesion of the anterior left lobe of the liver, hepatic segment II, is no longer discretely appreciable consistent with treatment response of a hepatic  metastasis. 2. No evidence of new metastatic disease in the chest, abdomen, or pelvis. 3. Interval increase in a small focus of consolidation and nodularity of the medial left upper lobe, consistent with minimal, ongoing atypical infection. Additional tiny bilateral pulmonary nodules are stable and almost certainly incidental benign. Attention on follow-up. 4. Status post right hemicolectomy and ileocolic anastomosis.     07/02/2022 Imaging      IMPRESSION: CHEST IMPRESSION:   1. No evidence of thoracic metastasis. 2. Stable small pulmonary nodules.   PELVIS IMPRESSION:   1. Stable to slight decrease in size of subcapsular lesion in the RIGHT hepatic lobe. 2. No evidence of new or progressive disease in the abdomen pelvis. 3.  Aortic Atherosclerosis (ICD10-I70.0).       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)   06/15/2020 Procedure     Screening Colonoscopy by Dr [name]  IMPRESSION - Decreased sphincter tone and internal hemorrhoids that prolapse with straining, but require manual replacement into the anal canal (Grade III) found on digital rectal exam. - Patent side-to-side ileo-colonic anastomosis, characterized by healthy appearing mucosa. - The examined portion of the ileum was normal. - One diminutive polyp in the proximal transverse colon, removed with a cold biopsy forceps. Resected and retrieved. - Likely malignant partially obstructing tumor in the mid rectum. Biopsied. Tattooed. - The examination was otherwise normal on direct and retroflexion views.     06/15/2020 Initial Biopsy     Diagnosis 1. Transverse Colon Polyp - HYPERPLASTIC POLYP 2. Rectum, biopsy - ADENOCARCINOMA ARISING IN A TUBULAR ADENOMA WITH HIGH-GRADE DYSPLASIA. SEE NOTE Diagnosis Note 2. Dr. Luisa Hart reviewed the case and concurs with the diagnosis. Dr. [name] was notified on 06/16/2020.     06/28/2020 Imaging     CT CAP  IMPRESSION: 1. New low-density focus in the anterior aspect of the  lateral segment LEFT hepatic lobe measuring 1.2 x 1.0 cm, compatible with small metastatic lesion in the LEFT hepatic lobe. 2. Soft tissue in the RIGHT iliac fossa following RIGHT hemicolectomy invades the psoas musculature and is slowly enlarging over time, more linear on the prior study now highly concerning for recurrence/metastasis to this location. 3. Signs of enteritis, potentially post radiation changes of the small bowel. Tethered small bowel in the RIGHT lower quadrant shows focal thickening and narrowing suspicious for small bowel involvement and developing partial obstruction though currently contrast passes beyond this point into the colon. 4. Rectal thickening in this patient with known rectal mass as described. 5. No evidence of metastatic disease in the chest. 6. Stable small  pulmonary nodules. 7.  and aortic atherosclerosis.   Aortic Atherosclerosis (ICD10-I70.0) and Emphysema (ICD10-J43.9).     07/04/2020 Initial Diagnosis     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)     07/12/2020 PET scan     IMPRESSION: 1. Exam positive for FDG avid rectal tumor which corresponds to the recent colonoscopy findings. 2. FDG avid soft tissue mass within the right iliac fossa is noted and consistent with local tumor recurrence from previous ascending colon tumor. 3. Lateral segment left lobe of liver lesion is FDG avid concerning for liver metastasis. 4. No specific findings identified to suggest metastatic disease to the chest.     08/10/2020 -  Chemotherapy     First-line FOLFIRI q2weeks starting 08/10/20. dose reduced with cycle 1. Irinotecan/5FU increased and Bevacizumab added with cycle 2 on 08/23/2020       08/17/2020 - 03/09/2022 Chemotherapy     Patient is on Treatment Plan : COLORECTAL FOLFIRI + Bevacizumab q14d      10/27/2020 Imaging     CT CAP  IMPRESSION: 1. Interval decrease in size of the hypermetabolic left hepatic lesion, consistent with metastatic disease. No new  liver lesion evident. 2. Interval resolution of the hypermetabolic soft tissue lesion along the right iliac fossa with no measurable soft tissue lesion remaining at this location today. 3. Similar appearance of soft tissue fullness in the rectum at the site of the hypermetabolic lesion seen previously. 4. Stable tiny bilateral pulmonary nodules. Continued attention on follow-up recommended. 5. Small volume free fluid in the pelvis. 6. Aortic Atherosclerosis (ICD10-I70.0).     01/22/2021 Imaging     CT CAP   IMPRESSION: CT CHEST IMPRESSION   1. Similar nonspecific pulmonary nodules. 2. New posterior left upper lobe reticulonodular opacity, suspicious for interval mild infection or inflammation. 3. No thoracic adenopathy.   CT ABDOMEN AND PELVIS IMPRESSION   1. Further decrease in size of high left hepatic lobe 3 mm low-density lesion. No new or progressive metastatic disease within the abdomen or pelvis. 2. Similar trace free pelvic fluid. 3. Similar nonspecific mid rectal wall thickening. 4.  Aortic Atherosclerosis (ICD10-I70.0).     04/23/2021 Imaging     CT CAP   IMPRESSION: 1. Treated metastatic lesion between segments 2 and 3 of the liver, slightly smaller and less distinct than prior examination. No other signs of definite metastatic disease elsewhere in the abdomen or pelvis. 2. Multiple small pulmonary nodules, stable compared to the prior examination, favored to be benign. No definitive findings to suggest metastatic disease to the thorax. 3. Aortic atherosclerosis. 4. Additional incidental findings, as above.     08/13/2021 Imaging     EXAM: CT CHEST, ABDOMEN, AND PELVIS WITH CONTRAST   IMPRESSION: 1. A previously seen PET avid lesion of the anterior left lobe of the liver, hepatic segment II, is no longer discretely appreciable consistent with treatment response of a hepatic metastasis. 2. No evidence of new metastatic disease in the chest, abdomen, or  pelvis. 3. Interval increase in a small focus of consolidation and nodularity of the medial left upper lobe, consistent with minimal, ongoing atypical infection. Additional tiny bilateral pulmonary nodules are stable and almost certainly incidental benign. Attention on follow-up. 4. Status post right hemicolectomy and ileocolic anastomosis.     03/28/2022 -  Chemotherapy     Patient is on Treatment Plan : COLORECTAL FOLFOX + Bevacizumab q14d      07/02/2022 Imaging      IMPRESSION: CHEST IMPRESSION:  1. No evidence of thoracic metastasis. 2. Stable small pulmonary nodules.   PELVIS IMPRESSION:   1. Stable to slight decrease in size of subcapsular lesion in the RIGHT hepatic lobe. 2. No evidence of new or progressive disease in the abdomen pelvis.     10/07/2022 Imaging      IMPRESSION: 1. Stable hypovascular mass in the periphery of the right lobe of the liver, which likely represents a treated metastatic lesion. Stable subcentimeter lesion just medial to this is also similar to the recent prior examination. No new hepatic lesions are otherwise noted. 2. Persistent but stable poorly defined soft tissue thickening in the right lower quadrant associated with multiple small bowel loops and the overlying iliopsoas musculature, which corresponds to focal hypermetabolism on remote prior PET-CT. Given the stability, this likely represents a treated metastatic lesion. Continued attention on follow-up studies is recommended. 3. Persistent mass-like thickening in the proximal rectum corresponding to previously noted hypermetabolic rectal neoplasm on prior PET-CT. This currently measures approximately 2.8 x 2.3 cm and is slightly more apparent than the most recent prior study. 4. Multiple small pulmonary nodules generally stable compared to the prior study, with exception of a new branching nodule in the anterior aspect of the left upper lobe measuring 7 x 3 mm (mean diameter 5 mm).  This is nonspecific. Close attention on follow-up studies is recommended to ensure stability. 5. Aortic atherosclerosis. 6. Additional incidental findings, as above.             INTERVAL HISTORY:  Brittney Spencer is here for a follow up of metastatic rectal cancer, h/o colon cancer . She was last seen by me on 12/05/2022. She presents to the clinic alone.  She states she still has tingling in her hands and feet.      All other systems were reviewed with the patient and are negative.   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 Some residual intraretinal fluid remains, very small localized subfoveal of fluid remains although this continues to slowly resorb. We'll continue to observe.   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    Resolved March 2021 post vitrectomy      SURGICAL HISTORY:      Past Surgical History:  Procedure Laterality Date   COLONOSCOPY   2018    [facility]   COLONSCOPY   12/2015   IR IMAGING GUIDED PORT INSERTION   08/04/2020   LAPAROSCOPIC RIGHT HEMI COLECTOMY Right 02/07/2016    Procedure: LAPAROSCOPIC ASSISTED RIGHT HEMI COLECTOMY AND RIGHT SALPINGO OOPHERECTOMY;  Surgeon: Glenna Fellows, MD;  Location: [facility];  Service: General;  Laterality: Right;   RETINAL DETACHMENT SURGERY   09/2019      I have reviewed the  social history and family history with the  patient and they are unchanged from previous note.   ALLERGIES:  is allergic to venofer [iron sucrose], fish allergy, peanut-containing drug products, soy allergy, and buspirone.   MEDICATIONS:        Current Outpatient Medications  Medication Sig Dispense Refill   ALPRAZolam (XANAX) 0.25 MG tablet Take 1 tablet (0.25 mg total) by mouth daily as needed for anxiety. 30 tablet 0   capecitabine (XELODA) 500 MG tablet Take 3 tablets (1,500 mg total) by mouth 2 (two) times daily after a meal. Take within 30 minutes after meals. Take for 14 days on, then off for 7 days. Repeat every 21 days. 84 tablet 1   Cholecalciferol (VITAMIN D3 PO) Take by mouth daily.       docusate sodium (COLACE) 100 MG capsule 1 capsule as needed       lidocaine-prilocaine (EMLA) cream Apply 1 Application topically as needed. 30 g 1   mirtazapine (REMERON) 7.5 MG tablet Take 1 tablet (7.5 mg total) by mouth at bedtime. 30 tablet 0   NORVASC 2.5 MG tablet TAKE 1 TABLET BY MOUTH EVERY DAY 90 tablet 1   ondansetron (ZOFRAN) 8 MG tablet Take 1 tablet (8 mg total) by mouth every 8 (eight) hours as needed for nausea or vomiting. 20 tablet 2   prochlorperazine (COMPAZINE) 10 MG tablet Take 1 tablet (10 mg total) by mouth every 6 (six) hours as needed for nausea or vomiting. 30 tablet 2             Current Facility-Administered Medications  Medication Dose Route Frequency Provider Last Rate Last Admin   0.9 %  sodium chloride infusion  500 mL Intravenous Once Sherrilyn Rist, MD                 Facility-Administered Medications Ordered in Other Visits  Medication Dose Route Frequency Provider Last Rate Last Admin   fluorouracil (ADRUCIL) 2,500 mg in sodium chloride 0.9 % 100 mL chemo infusion  1,800 mg/m2 (Order-Specific) Intravenous 1 day or 1 dose [name], MD   Infusion Verify at 12/05/22 1208   sodium chloride flush (NS) 0.9 % injection 10 mL  10 mL Intracatheter PRN [name], MD   10 mL at 12/05/22 1156       PHYSICAL EXAMINATION: ECOG PERFORMANCE STATUS: 1 - Symptomatic but completely ambulatory      Vitals:  BP (!) 142/94 (BP Location: Left Arm, Patient Position: Sitting)   Pulse 93   Temp 98.4 F (36.9 C) (Oral)   Resp 18   Ht 5\' 6"  (1.676 m)   Wt 102 lb 11.2 oz (46.6 kg)   SpO2 99%   BMI 16.58 kg/m   GENERAL:alert, no distress and comfortable SKIN: skin color normal, no rashes or significant lesions EYES: normal, Conjunctiva are pink and non-injected, sclera clear  NEURO: alert & oriented x 3 with fluent speech   LABORATORY DATA:  I have reviewed the data as listed    Latest Ref Rng & Units 12/18/2022    9:31 AM 12/05/2022    9:21 AM 11/14/2022    7:42 AM  CBC  WBC 4.0 - 10.5 K/uL 4.8  3.1  4.7   Hemoglobin 12.0 - 15.0 g/dL 09.8  11.9  9.9   Hematocrit 36.0 - 46.0 % 33.8  32.6  32.1   Platelets 150 - 400 K/uL 109  143  92       Latest Ref Rng &  Units 12/18/2022    9:31 AM 12/05/2022    9:21 AM 11/14/2022    7:42 AM  CMP  Glucose 70 - 99 mg/dL 93  92  86   BUN 8 - 23 mg/dL 11  11  14    Creatinine 0.44 - 1.00 mg/dL 1.47  8.29  5.62   Sodium 135 - 145 mmol/L 137  137  137   Potassium 3.5 - 5.1 mmol/L 4.3  4.1  4.2   Chloride 98 - 111 mmol/L 104  105  105   CO2 22 - 32 mmol/L 29  28  27    Calcium 8.9 - 10.3 mg/dL 9.1  8.7  8.6   Total Protein 6.5 - 8.1 g/dL 6.7  7.2  6.8   Total Bilirubin 0.3 - 1.2 mg/dL 0.4  0.3  0.3   Alkaline Phos 38 - 126 U/L 91  82  81   AST 15 - 41 U/L 16  22  17    ALT 0 - 44 U/L 8  12  8              RADIOGRAPHIC STUDIES: I have personally reviewed the radiological images as listed and agreed with the findings in the report. Imaging Results (Last 48 hours)  No results found.        All questions were answered. The patient knows to call the clinic with any problems, questions or concerns. No barriers to learning was detected. The total time spent in the appointment was 25 minutes.      [name], MD 12/18/2022    I, Sharlette Dense, CMA,  am acting as scribe for [name], MD.    I have reviewed the above documentation for accuracy and completeness, and I agree with the above.

## 2022-12-18 NOTE — Patient Instructions (Addendum)
[facility] AT [facility]  Discharge Instructions: Thank you for choosing [facility] to provide your oncology and hematology care.   If you have a lab appointment with the Cancer Center, please go directly to the Cancer Center and check in at the registration area.   Wear comfortable clothing and clothing appropriate for easy access to any Portacath or PICC line.   We strive to give you quality time with your provider. You may need to reschedule your appointment if you arrive late (15 or more minutes).  Arriving late affects you and other patients whose appointments are after yours.  Also, if you miss three or more appointments without notifying the office, you may be dismissed from the clinic at the provider's discretion.      For prescription refill requests, have your pharmacy contact our office and allow 72 hours for refills to be completed.    Today you received the following chemotherapy and/or immunotherapy agents: Zirabev, Leucovorin/Fluorouracil      To help prevent nausea and vomiting after your treatment, we encourage you to take your nausea medication as directed.  BELOW ARE SYMPTOMS THAT SHOULD BE REPORTED IMMEDIATELY: *FEVER GREATER THAN 100.4 F (38 C) OR HIGHER *CHILLS OR SWEATING *NAUSEA AND VOMITING THAT IS NOT CONTROLLED WITH YOUR NAUSEA MEDICATION *UNUSUAL SHORTNESS OF BREATH *UNUSUAL BRUISING OR BLEEDING *URINARY PROBLEMS (pain or burning when urinating, or frequent urination) *BOWEL PROBLEMS (unusual diarrhea, constipation, pain near the anus) TENDERNESS IN MOUTH AND THROAT WITH OR WITHOUT PRESENCE OF ULCERS (sore throat, sores in mouth, or a toothache) UNUSUAL RASH, SWELLING OR PAIN  UNUSUAL VAGINAL DISCHARGE OR ITCHING   Items with * indicate a potential emergency and should be followed up as soon as possible or go to the Emergency Department if any problems should occur.  Please show the CHEMOTHERAPY ALERT CARD or  IMMUNOTHERAPY ALERT CARD at check-in to the Emergency Department and triage nurse.  Should you have questions after your visit or need to cancel or reschedule your appointment, please contact [facility]  Dept: [phone]  and follow the prompts.  Office hours are 8:00 a.m. to 4:30 p.m. Monday - Friday. Please note that voicemails left after 4:00 p.m. may not be returned until the following business day.  We are closed weekends and major holidays. You have access to a nurse at all times for urgent questions. Please call the main number to the clinic Dept: [phone] and follow the prompts.   For any non-urgent questions, you may also contact your provider using MyChart. We now offer e-Visits for anyone 42 and older to request care online for non-urgent symptoms. For details visit [email or web address].   Also download the MyChart app! Go to the app store, search "MyChart", open the app, select , and log in with your MyChart username and password.

## 2022-12-19 ENCOUNTER — Telehealth: Payer: Self-pay

## 2022-12-19 NOTE — Telephone Encounter (Signed)
Pt call to confirm time and date for her D/C pump on 12/20/2022. I let her pt know and she verbalized understanding.  Monica Martinez, CMA

## 2022-12-20 ENCOUNTER — Other Ambulatory Visit: Payer: Self-pay

## 2022-12-20 ENCOUNTER — Inpatient Hospital Stay: Payer: Federal, State, Local not specified - PPO

## 2022-12-20 VITALS — BP 126/69 | HR 87 | Temp 98.7°F | Resp 16

## 2022-12-20 DIAGNOSIS — Z5112 Encounter for antineoplastic immunotherapy: Secondary | ICD-10-CM | POA: Diagnosis not present

## 2022-12-20 DIAGNOSIS — C20 Malignant neoplasm of rectum: Secondary | ICD-10-CM

## 2022-12-20 MED ORDER — SODIUM CHLORIDE 0.9% FLUSH
10.0000 mL | INTRAVENOUS | Status: DC | PRN
  Administered 2022-12-20: 10 mL

## 2022-12-20 MED ORDER — PEGFILGRASTIM-JMDB 6 MG/0.6ML ~~LOC~~ SOSY
6.0000 mg | PREFILLED_SYRINGE | Freq: Once | SUBCUTANEOUS | Status: AC
  Administered 2022-12-20: 6 mg via SUBCUTANEOUS
  Filled 2022-12-20: qty 0.6

## 2022-12-20 MED ORDER — HEPARIN SOD (PORK) LOCK FLUSH 100 UNIT/ML IV SOLN
500.0000 [IU] | Freq: Once | INTRAVENOUS | Status: AC | PRN
  Administered 2022-12-20: 500 [IU]

## 2022-12-23 NOTE — Therapy (Addendum)
OUTPATIENT PHYSICAL THERAPY ONCOLOGY EVALUATION  Patient Name: Brittney Spencer MRN: [id number] DOB:1951-06-08, 72 y.o., female Today's Date: 12/24/2022  END OF SESSION:  PT End of Session - 12/24/22 1054     Visit Number 1    Number of Visits 17    Date for PT Re-Evaluation 02/18/23    PT Start Time 1000    PT Stop Time 1052    PT Time Calculation (min) 52 min    Activity Tolerance Patient tolerated treatment well    Behavior During Therapy [facility] for tasks assessed/performed             Past Medical History:  Diagnosis Date   AAA (abdominal aortic aneurysm) (HCC)    infrarenal 4.1 cmper s-9-19 scan on chart   Anemia    hx of   Anxiety    has PRN meds   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 Some residual intraretinal fluid remains, very small localized subfoveal of fluid remains although this continues to slowly resorb. We'll continue to observe.   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   Resolved March 2021 post vitrectomy   Past Surgical History:  Procedure Laterality Date   COLONOSCOPY  2018   [facility]   COLONSCOPY  12/2015   IR IMAGING GUIDED PORT INSERTION  08/04/2020   LAPAROSCOPIC RIGHT HEMI COLECTOMY Right 02/07/2016   Procedure: LAPAROSCOPIC ASSISTED RIGHT HEMI COLECTOMY AND RIGHT SALPINGO OOPHERECTOMY;  Surgeon: Glenna Fellows, MD;  Location: [facility];  Service: General;  Laterality: Right;   RETINAL DETACHMENT SURGERY  09/2019   Patient Active Problem List   Diagnosis Date Noted   Peripheral neuropathy due to chemotherapy (HCC) 11/14/2022   DNR (do not  resuscitate) 04/25/2022   Encounter for antineoplastic chemotherapy 11/29/2021   Macular hole of right eye 02/20/2021   Macular pucker, right eye 02/20/2021   Port-A-Cath in place 08/10/2020   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) 07/04/2020   History of vitrectomy 03/28/2020   Vitreomacular traction, left 03/28/2020   Nuclear sclerotic cataract of left eye 03/28/2020   Follow-up examination after eye surgery 10/21/2019   Essential hypertension 03/26/2016   Iron deficiency anemia due to chronic blood loss 03/26/2016   Cancer of ascending colon (HCC) 02/07/2016   Low hemoglobin 01/05/2016   Perforated appendicitis 09/15/2015    PCP: none  REFERRING PROVIDER: [name], MD  REFERRING DIAG: C20,C78.7 (ICD-10-CM) - Rectal cancer metastasized to liver [facility])  THERAPY DIAG:  Rectal cancer metastasized to liver [facility])  Muscle weakness (generalized)  Unspecified disturbances of skin sensation  Difficulty in walking, not elsewhere classified  ONSET DATE: 11/23/22  Rationale for Evaluation and Treatment: Rehabilitation  SUBJECTIVE:  SUBJECTIVE STATEMENT: I have tingling in my hands and feet. Walking can be challenging sometimes. I constantly walked. I have slowed down a little bit because of that. I am more mindful of going up the stairs. I sometimes have balance issues. I am more careful.  PERTINENT HISTORY:  Cancer of ascending colon (HCC), diagnosed in 12/2015. S/p right hemicolectomy with right salpingo oophorectomy and adjuvant chemo and radiation.Diagnosed with rectal cancer with mets to the liver in 2021. her PET from 07/12/20 indicates soft tissue mass within the right iliac fossa is noted and consistent with local tumor recurrence from previous ascending colon tumor. Restaging on 10/07/22 showed  overall stable disease with multiple small lung nodules. Currently on chemo and has neuropathy and anemia  PAIN:  Are you having pain? No  PRECAUTIONS: Other: metastatic rectal cancer to liver  WEIGHT BEARING RESTRICTIONS: No  FALLS:  Has patient fallen in last 6 months? No  LIVING ENVIRONMENT: Lives with: lives alone Lives in: House/apartment Stairs: No;  Has following equipment at home: Grab bars  OCCUPATION: retired  LEISURE: pt intentionally walks  HAND DOMINANCE: right   PRIOR LEVEL OF FUNCTION: Independent  PATIENT GOALS: "to see if it offers any other suggestions as far as the body and what else I can do for the neuropathy"   OBJECTIVE:  COGNITION: Overall cognitive status: Within functional limits for tasks assessed    SENSATION: Tingling in bilateral feet and hands  POSTURE: forward head and rounded shoulders  UPPER EXTREMITY AROM/PROM: WFL  UPPER EXTREMITY STRENGTH: 5/5   LOWER EXTREMITY MMT:  MMT Right eval  Hip flexion 2+/5  Hip extension 2+/5  Hip abduction 2+/5  Hip adduction   Hip internal rotation   Hip external rotation   Knee flexion 3+/5  Knee extension 5/5  Ankle dorsiflexion 5/5  Ankle plantarflexion   Ankle inversion   Ankle eversion    (Blank rows = not tested)  MMT LEFT eval  Hip flexion 3+/5  Hip extension 2+/5  Hip abduction 3+/5  Hip adduction   Hip internal rotation   Hip external rotation   Knee flexion 4/5  Knee extension 5/5  Ankle dorsiflexion 5/5  Ankle plantarflexion   Ankle inversion   Ankle eversion    (Blank rows = not tested)     FUNCTIONAL TESTS:  30 seconds chair stand test 11 reps without use of UEs which is average for her age  GAIT: Distance walked: 25 feet Assistive device utilized: None Level of assistance: SBA Comments: foot flat, slightly unsteady appearing  SINGLE LIMB STANCE:  2 sec on R 3 sec on L  TODAY'S TREATMENT:                                                                                                                                          DATE:  12/24/22: quad stretch in standing with HHA at counter  PATIENT EDUCATION:  Education details: discussed importance of practicing sit to stands without hands as an exercise, instructed pt in quad stretch and had her return demo, how exercise can help with neuropathy Person educated: Patient Education method: Explanation Education comprehension: verbalized understanding  HOME EXERCISE PROGRAM: Sit to stands without use of UEs  ASSESSMENT:  CLINICAL IMPRESSION: Patient is a 72 y.o. female who was seen today for physical therapy evaluation and treatment for neuropathy, muscle weakness, and balance issues. Pt reports her CIPN used to come and go but recently has been constant. She has significant LE weakness especially on the R side which was the same side as the surgery for colon cancer. She is unable to do SLS for greater than 3 seconds on either side. She reports being very intentional with walking due to fear of falling and neuropathy. She would benefit from skilled PT services to improve LE strength, balance and decrease symptoms of neuropathy.    OBJECTIVE IMPAIRMENTS: decreased activity tolerance, decreased balance, difficulty walking, decreased strength, increased fascial restrictions, impaired flexibility, impaired sensation, and postural dysfunction.   ACTIVITY LIMITATIONS: stairs and locomotion level  PARTICIPATION LIMITATIONS: cleaning, driving, and community activity  PERSONAL FACTORS: Age, Fitness, and Time since onset of injury/illness/exacerbation are also affecting patient's functional outcome.   REHAB POTENTIAL: Good  CLINICAL DECISION MAKING: Stable/uncomplicated  EVALUATION COMPLEXITY: Low  GOALS: Goals reviewed with patient? Yes  SHORT TERM GOALS: Target date: 01/21/23  Pt will be able to do SLS bilaterally for 5 sec each to decrease fall risk. Baseline: Goal status:  INITIAL  2.  Pt will be independent with initial HEP for stretching and strengthening.  Baseline:  Goal status: INITIAL  3.  Pt will be able to ascend/descend the curb without difficulty to allow improved mobility.  Baseline:  Goal status: INITIAL  4.  Pt will demonstrate 3/5 R hip flexor strength to decrease fall risk.  Baseline:  Goal status: INITIAL   LONG TERM GOALS: Target date: 02/18/23  Pt will demonstrate 3+/5 hip flexor strength on the R to decrease fall risk.  Baseline:  Goal status: INITIAL  2.  Pt will be able to get in and out of the car without manually lifting her R leg. Baseline:  Goal status: INITIAL  3.  Pt will be able to ascend/descend a flight of steps with out assistance or fear of falling.  Baseline:  Goal status: INITIAL  4.  Pt will demonstrate 3/5 bilateral hip extensor strength to decrease fall risk.  Baseline:  Goal status: INITIAL  5.  Pt will be independent in a home exercise program for continued stretching and strengthening.  Baseline:  Goal status: INITIAL   PLAN:  PT FREQUENCY: 2x/week  PT DURATION: 8 weeks  PLANNED INTERVENTIONS: Therapeutic exercises, Therapeutic activity, Neuromuscular re-education, Balance training, Gait training, Patient/Family education, Self Care, Joint mobilization, Stair training, and Manual therapy  PLAN FOR NEXT SESSION: BERG and add goal as needed, NuStep, LE strengthening, balance exercises   [employer], PT 12/24/2022, 12:29 PM

## 2022-12-24 ENCOUNTER — Encounter: Payer: Self-pay | Admitting: Hematology

## 2022-12-24 ENCOUNTER — Ambulatory Visit: Payer: Federal, State, Local not specified - PPO | Attending: Hematology | Admitting: Physical Therapy

## 2022-12-24 ENCOUNTER — Encounter: Payer: Self-pay | Admitting: Physical Therapy

## 2022-12-24 ENCOUNTER — Other Ambulatory Visit: Payer: Self-pay

## 2022-12-24 DIAGNOSIS — R262 Difficulty in walking, not elsewhere classified: Secondary | ICD-10-CM | POA: Insufficient documentation

## 2022-12-24 DIAGNOSIS — M6281 Muscle weakness (generalized): Secondary | ICD-10-CM | POA: Insufficient documentation

## 2022-12-24 DIAGNOSIS — C20 Malignant neoplasm of rectum: Secondary | ICD-10-CM | POA: Diagnosis present

## 2022-12-24 DIAGNOSIS — R209 Unspecified disturbances of skin sensation: Secondary | ICD-10-CM | POA: Insufficient documentation

## 2022-12-24 DIAGNOSIS — C787 Secondary malignant neoplasm of liver and intrahepatic bile duct: Secondary | ICD-10-CM | POA: Diagnosis present

## 2022-12-30 ENCOUNTER — Inpatient Hospital Stay: Payer: Federal, State, Local not specified - PPO | Attending: Nurse Practitioner

## 2022-12-30 ENCOUNTER — Ambulatory Visit (HOSPITAL_COMMUNITY)
Admission: RE | Admit: 2022-12-30 | Discharge: 2022-12-30 | Disposition: A | Discharge status: Home / Self Care | Payer: Federal, State, Local not specified - PPO | Source: Ambulatory Visit | Attending: Hematology | Admitting: Hematology

## 2022-12-30 ENCOUNTER — Other Ambulatory Visit: Payer: Self-pay

## 2022-12-30 DIAGNOSIS — Z9221 Personal history of antineoplastic chemotherapy: Secondary | ICD-10-CM | POA: Insufficient documentation

## 2022-12-30 DIAGNOSIS — C20 Malignant neoplasm of rectum: Secondary | ICD-10-CM | POA: Diagnosis not present

## 2022-12-30 DIAGNOSIS — Z5112 Encounter for antineoplastic immunotherapy: Secondary | ICD-10-CM | POA: Insufficient documentation

## 2022-12-30 DIAGNOSIS — C787 Secondary malignant neoplasm of liver and intrahepatic bile duct: Secondary | ICD-10-CM

## 2022-12-30 DIAGNOSIS — Z923 Personal history of irradiation: Secondary | ICD-10-CM | POA: Insufficient documentation

## 2022-12-30 DIAGNOSIS — Z1509 Genetic susceptibility to other malignant neoplasm: Secondary | ICD-10-CM | POA: Insufficient documentation

## 2022-12-30 DIAGNOSIS — Z9049 Acquired absence of other specified parts of digestive tract: Secondary | ICD-10-CM | POA: Insufficient documentation

## 2022-12-30 DIAGNOSIS — R911 Solitary pulmonary nodule: Secondary | ICD-10-CM | POA: Insufficient documentation

## 2022-12-30 DIAGNOSIS — G620 Drug-induced polyneuropathy: Secondary | ICD-10-CM | POA: Insufficient documentation

## 2022-12-30 DIAGNOSIS — T451X5D Adverse effect of antineoplastic and immunosuppressive drugs, subsequent encounter: Secondary | ICD-10-CM | POA: Insufficient documentation

## 2022-12-30 DIAGNOSIS — Z95828 Presence of other vascular implants and grafts: Secondary | ICD-10-CM

## 2022-12-30 DIAGNOSIS — Z5189 Encounter for other specified aftercare: Secondary | ICD-10-CM | POA: Insufficient documentation

## 2022-12-30 DIAGNOSIS — Z5111 Encounter for antineoplastic chemotherapy: Secondary | ICD-10-CM | POA: Insufficient documentation

## 2022-12-30 DIAGNOSIS — Z66 Do not resuscitate: Secondary | ICD-10-CM | POA: Insufficient documentation

## 2022-12-30 LAB — CMP (CANCER CENTER ONLY)
ALT: 7 U/L (ref 0–44)
AST: 16 U/L (ref 15–41)
Albumin: 3.4 g/dL — ABNORMAL LOW (ref 3.5–5.0)
Alkaline Phosphatase: 94 U/L (ref 38–126)
Anion gap: 5 (ref 5–15)
BUN: 12 mg/dL (ref 8–23)
CO2: 28 mmol/L (ref 22–32)
Calcium: 8.5 mg/dL — ABNORMAL LOW (ref 8.9–10.3)
Chloride: 107 mmol/L (ref 98–111)
Creatinine: 0.71 mg/dL (ref 0.44–1.00)
GFR, Estimated: >60 mL/min (ref >60)
Glucose, Bld: 81 mg/dL (ref 70–99)
Potassium: 4.1 mmol/L (ref 3.5–5.1)
Sodium: 140 mmol/L (ref 135–145)
Total Bilirubin: 0.4 mg/dL (ref 0.3–1.2)
Total Protein: 7 g/dL (ref 6.5–8.1)

## 2022-12-30 LAB — CBC WITH DIFFERENTIAL (CANCER CENTER ONLY)
Abs Immature Granulocytes: 0.04 10*3/uL (ref 0.00–0.07)
Basophils Absolute: 0 10*3/uL (ref 0.0–0.1)
Basophils Relative: 1 %
Eosinophils Absolute: 0.2 10*3/uL (ref 0.0–0.5)
Eosinophils Relative: 3 %
HCT: 32.9 % — ABNORMAL LOW (ref 36.0–46.0)
Hemoglobin: 10.4 g/dL — ABNORMAL LOW (ref 12.0–15.0)
Immature Granulocytes: 1 %
Lymphocytes Relative: 16 %
Lymphs Abs: 1 10*3/uL (ref 0.7–4.0)
MCH: 29.8 pg (ref 26.0–34.0)
MCHC: 31.6 g/dL (ref 30.0–36.0)
MCV: 94.3 fL (ref 80.0–100.0)
Monocytes Absolute: 0.6 10*3/uL (ref 0.1–1.0)
Monocytes Relative: 9 %
Neutro Abs: 4.5 10*3/uL (ref 1.7–7.7)
Neutrophils Relative %: 70 %
Platelet Count: 110 10*3/uL — ABNORMAL LOW (ref 150–400)
RBC: 3.49 MIL/uL — ABNORMAL LOW (ref 3.87–5.11)
RDW: 13.4 % (ref 11.5–15.5)
WBC Count: 6.3 10*3/uL (ref 4.0–10.5)
nRBC: 0 % (ref 0.0–0.2)

## 2022-12-30 MED ORDER — SODIUM CHLORIDE 0.9% FLUSH
10.0000 mL | Freq: Once | INTRAVENOUS | Status: AC
  Administered 2022-12-30: 10 mL

## 2022-12-30 MED ORDER — IOHEXOL 9 MG/ML PO SOLN
1000.0000 mL | ORAL | Status: AC
  Administered 2022-12-30: 1000 mL via ORAL

## 2022-12-30 MED ORDER — SODIUM CHLORIDE (PF) 0.9 % IJ SOLN
INTRAMUSCULAR | Status: AC
  Filled 2022-12-30: qty 50

## 2022-12-30 MED ORDER — HEPARIN SOD (PORK) LOCK FLUSH 100 UNIT/ML IV SOLN
INTRAVENOUS | Status: AC
  Filled 2022-12-30: qty 5

## 2022-12-30 MED ORDER — IOHEXOL 300 MG/ML  SOLN
80.0000 mL | Freq: Once | INTRAMUSCULAR | Status: AC | PRN
  Administered 2022-12-30: 80 mL via INTRAVENOUS

## 2022-12-30 MED ORDER — IOHEXOL 9 MG/ML PO SOLN
ORAL | Status: AC
  Filled 2022-12-30: qty 1000

## 2022-12-30 MED ORDER — HEPARIN SOD (PORK) LOCK FLUSH 100 UNIT/ML IV SOLN
500.0000 [IU] | Freq: Once | INTRAVENOUS | Status: AC
  Administered 2022-12-30: 500 [IU] via INTRAVENOUS

## 2022-12-31 ENCOUNTER — Ambulatory Visit: Payer: Federal, State, Local not specified - PPO | Attending: Hematology | Admitting: Physical Therapy

## 2022-12-31 ENCOUNTER — Encounter: Payer: Self-pay | Admitting: Physical Therapy

## 2022-12-31 ENCOUNTER — Other Ambulatory Visit: Payer: Self-pay

## 2022-12-31 DIAGNOSIS — R209 Unspecified disturbances of skin sensation: Secondary | ICD-10-CM | POA: Diagnosis present

## 2022-12-31 DIAGNOSIS — C20 Malignant neoplasm of rectum: Secondary | ICD-10-CM | POA: Insufficient documentation

## 2022-12-31 DIAGNOSIS — C787 Secondary malignant neoplasm of liver and intrahepatic bile duct: Secondary | ICD-10-CM | POA: Diagnosis present

## 2022-12-31 DIAGNOSIS — M6281 Muscle weakness (generalized): Secondary | ICD-10-CM | POA: Insufficient documentation

## 2022-12-31 DIAGNOSIS — R262 Difficulty in walking, not elsewhere classified: Secondary | ICD-10-CM | POA: Insufficient documentation

## 2022-12-31 NOTE — Assessment & Plan Note (Signed)
Secondary to oxaliplatin which we stopped in 09/2022 -stable overall  -I recommend her to take over-the-counter B12

## 2022-12-31 NOTE — Assessment & Plan Note (Signed)
-  Patient understands her cancer is not curable, and the goal of therapy is palliative -She has agreed with DNR 

## 2022-12-31 NOTE — Assessment & Plan Note (Signed)
diagnosed in 12/2015. S/p right hemicolectomy with right salpingo oophorectomy and adjuvant ChemoRT. 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 

## 2022-12-31 NOTE — Assessment & Plan Note (Signed)
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  -She began first-line chemo with dose-reduced FOLFIRI and Beva in 2/22.  -we changed to FOLFOX with continued Beva on 03/28/22. She tolerated well overall with some fatigue and cold sensitivity. -I personally reviewed her restaging CT from 10/07/2022 which showed overall SD in liver and rectum, stable and indermininate lung nodules, no other new lesions  -She is tolerating chemotherapy overall well, will continue. -I personally reviewed her restaging CT scan from October 07, 2022, which showed overall stable disease.  She has multiple small lung nodules, indeterminate, including one 7 mm nodule, will continue monitoring. -She has developed neuropathy in her hands and feet, grade 2, we stopped oxaliplatin in 09/2022 -her restaging CT from 12/30/2022 showed cancer progression in her liver, I personally reviewed her scan images with patient. -We discussed option of adding oxaliplatin back, or try third line therapy with Lonsurf and bevacizumab.

## 2022-12-31 NOTE — Therapy (Signed)
OUTPATIENT PHYSICAL THERAPY ONCOLOGY TREATMENT  Patient Name: Brittney Spencer MRN: [id number] DOB:[birth year], 72 y.o., female Today's Date: 12/31/2022  END OF SESSION:  PT End of Session - 12/31/22 1257     Visit Number 2    Number of Visits 17    Date for PT Re-Evaluation 02/18/23    PT Start Time 1158    PT Stop Time 1240    PT Time Calculation (min) 42 min    Activity Tolerance Patient tolerated treatment well    Behavior During Therapy [facility] for tasks assessed/performed              Past Medical History:  Diagnosis Date   AAA (abdominal aortic aneurysm) (HCC)    infrarenal 4.1 cmper s-9-19 scan on chart   Anemia    hx of   Anxiety    has PRN meds   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 Some residual intraretinal fluid remains, very small localized subfoveal of fluid remains although this continues to slowly resorb. We'll continue to observe.   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   Resolved March 2021 post vitrectomy   Past Surgical History:  Procedure Laterality Date   COLONOSCOPY  2018   HD-hams   COLONSCOPY  12/2015   IR IMAGING GUIDED PORT INSERTION  08/04/2020   LAPAROSCOPIC RIGHT HEMI COLECTOMY Right 02/07/2016   Procedure: LAPAROSCOPIC ASSISTED RIGHT HEMI COLECTOMY AND RIGHT SALPINGO OOPHERECTOMY;  Surgeon: Glenna Fellows, MD;  Location: [facility];  Service: General;  Laterality: Right;   RETINAL DETACHMENT SURGERY  09/2019   Patient Active Problem List   Diagnosis Date Noted   Peripheral neuropathy due to chemotherapy (HCC) 11/14/2022   DNR (do not  resuscitate) 04/25/2022   Encounter for antineoplastic chemotherapy 11/29/2021   Macular hole of right eye 02/20/2021   Macular pucker, right eye 02/20/2021   Port-A-Cath in place 08/10/2020   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) 07/04/2020   History of vitrectomy 03/28/2020   Vitreomacular traction, left 03/28/2020   Nuclear sclerotic cataract of left eye 03/28/2020   Follow-up examination after eye surgery 10/21/2019   Essential hypertension 03/26/2016   Iron deficiency anemia due to chronic blood loss 03/26/2016   Cancer of ascending colon (HCC) 02/07/2016   Low hemoglobin 01/05/2016   Perforated appendicitis 09/15/2015    PCP: none  REFERRING PROVIDER: [name], MD  REFERRING DIAG: C20,C78.7 (ICD-10-CM) - Rectal cancer metastasized to liver [facility])  THERAPY DIAG:  Muscle weakness (generalized)  Unspecified disturbances of skin sensation  Difficulty in walking, not elsewhere classified  Rectal cancer metastasized to liver [facility])  ONSET DATE: 11/23/22  Rationale for Evaluation and Treatment: Rehabilitation  SUBJECTIVE:  SUBJECTIVE STATEMENT: I have been trying to do some stuff at home. I was doing the sit to stands but I have some soreness in my hips.   PERTINENT HISTORY:  Cancer of ascending colon (HCC), diagnosed in 12/2015. S/p right hemicolectomy with right salpingo oophorectomy and adjuvant chemo and radiation.Diagnosed with rectal cancer with mets to the liver in 2021. her PET from 07/12/20 indicates soft tissue mass within the right iliac fossa is noted and consistent with local tumor recurrence from previous ascending colon tumor. Restaging on 10/07/22 showed overall stable disease with multiple small lung nodules. Currently on chemo and has neuropathy and anemia  PAIN:  Are you  having pain? Yes 1/10, bilateral hips, aggravating factors: twisting, relieving: not to the point pt seeks this out  PRECAUTIONS: Other: metastatic rectal cancer to liver  WEIGHT BEARING RESTRICTIONS: No  FALLS:  Has patient fallen in last 6 months? No  LIVING ENVIRONMENT: Lives with: lives alone Lives in: House/apartment Stairs: No;  Has following equipment at home: Grab bars  OCCUPATION: retired  LEISURE: pt intentionally walks  HAND DOMINANCE: right   PRIOR LEVEL OF FUNCTION: Independent  PATIENT GOALS: "to see if it offers any other suggestions as far as the body and what else I can do for the neuropathy"   OBJECTIVE:  COGNITION: Overall cognitive status: Within functional limits for tasks assessed    SENSATION: Tingling in bilateral feet and hands  POSTURE: forward head and rounded shoulders  UPPER EXTREMITY AROM/PROM: WFL  UPPER EXTREMITY STRENGTH: 5/5   LOWER EXTREMITY MMT:  MMT Right eval  Hip flexion 2+/5  Hip extension 2+/5  Hip abduction 2+/5  Hip adduction   Hip internal rotation   Hip external rotation   Knee flexion 3+/5  Knee extension 5/5  Ankle dorsiflexion 5/5  Ankle plantarflexion   Ankle inversion   Ankle eversion    (Blank rows = not tested)  MMT LEFT eval  Hip flexion 3+/5  Hip extension 2+/5  Hip abduction 3+/5  Hip adduction   Hip internal rotation   Hip external rotation   Knee flexion 4/5  Knee extension 5/5  Ankle dorsiflexion 5/5  Ankle plantarflexion   Ankle inversion   Ankle eversion    (Blank rows = not tested)     FUNCTIONAL TESTS:  30 seconds chair stand test 11 reps without use of UEs which is average for her age  67/2/24: [name] Balance Test Sit to Stand: Able to stand without using hands and stabilize independently Standing Unsupported: Able to stand safely 2 minutes Sitting with Back Unsupported but Feet Supported on Floor or Stool: Able to sit safely and securely 2 minutes Stand to Sit: Sits  safely with minimal use of hands Transfers: Able to transfer safely, minor use of hands Standing Unsupported with Eyes Closed: Able to stand 10 seconds safely Standing Ubsupported with Feet Together: Able to place feet together independently and stand 1 minute safely From Standing, Reach Forward with Outstretched Arm: Can reach forward >12 cm safely (5") From Standing Position, Pick up Object from Floor: Able to pick up shoe safely and easily From Standing Position, Turn to Look Behind Over each Shoulder: Turn sideways only but maintains balance Turn 360 Degrees: Able to turn 360 degrees safely but slowly Standing Unsupported, Alternately Place Feet on Step/Stool: Able to stand independently and safely and complete 8 steps in 20 seconds Standing Unsupported, One Foot in Front: Needs help to step but can hold 15 seconds Standing on One Leg: Tries  to lift leg/unable to hold 3 seconds but remains standing independently Total Score: 45   GAIT: Distance walked: 25 feet Assistive device utilized: None Level of assistance: SBA Comments: foot flat, slightly unsteady appearing  SINGLE LIMB STANCE:  2 sec on R 3 sec on L  TODAY'S TREATMENT:                                                                                                                                         DATE:  12/31/22:  Performed Berg Balance test - see above pt scored a 45/50, she had difficulty with balance with turning quickly and reaching forward with feet planted Instructed pt in seated hamstring stretch in sitting- pt required max v/c and t/c cues for this to avoid scapular protraction, twisting, keeping knee straight and foot pulled back with 20 sec holds - pt felt tight bilaterally Instructed in quad stretch with chair behind her at the counter with pt able to hold about 10 sec with good stretch felt bilaterally Tandem stance in the corner and educated pt to do this in the kitchen facing the corner with counter on  either side for support  Created HEP  12/24/22: quad stretch in standing with HHA at counter  PATIENT EDUCATION:  Education details:hamstring stretch without protraction of scapula, quad stretch with chair for leg support, tandem stance in corner Person educated: Patient Education method: Explanation Education comprehension: verbalized understanding  HOME EXERCISE PROGRAM: Sit to stands without use of UEs Access Code: [email or web address] URL: [email or web address] Date: 12/31/2022 Prepared by: Leonette Most  Exercises - Seated Hamstring Stretch  - 1 x daily - 7 x weekly - 1 sets - 3-5 reps - work up to 30 sec hold - Theatre manager with Chair and Counter Support  - 1 x daily - 7 x weekly - 1 sets - 3-5 reps - work up to 30 sec  hold - Standing Tandem Balance with Counter Support  - 1 x daily - 7 x weekly - 1 sets - 5-10 reps - work up to 30 sec  hold  ASSESSMENT:  CLINICAL IMPRESSION: Assessed pt's balance with Berg Balance test today. Pt did fairly well but has difficulty with narrow base of support and with turning without losing balance. Began exercises today and started with stretching. Pt worried she may be fatigued for lunch so she requested to end session early. Will continue to progress to strengthening and high level balance exercises.   OBJECTIVE IMPAIRMENTS: decreased activity tolerance, decreased balance, difficulty walking, decreased strength, increased fascial restrictions, impaired flexibility, impaired sensation, and postural dysfunction.   ACTIVITY LIMITATIONS: stairs and locomotion level  PARTICIPATION LIMITATIONS: cleaning, driving, and community activity  PERSONAL FACTORS: Age, Fitness, and Time since onset of injury/illness/exacerbation are also affecting patient's functional outcome.   REHAB POTENTIAL: Good  CLINICAL DECISION MAKING: Stable/uncomplicated  EVALUATION COMPLEXITY: Low  GOALS: Goals reviewed with patient?  Yes  SHORT TERM  GOALS: Target date: 01/21/23  Pt will be able to do SLS bilaterally for 5 sec each to decrease fall risk. Baseline: Goal status: INITIAL  2.  Pt will be independent with initial HEP for stretching and strengthening.  Baseline:  Goal status: INITIAL  3.  Pt will be able to ascend/descend the curb without difficulty to allow improved mobility.  Baseline:  Goal status: INITIAL  4.  Pt will demonstrate 3/5 R hip flexor strength to decrease fall risk.  Baseline:  Goal status: INITIAL   LONG TERM GOALS: Target date: 02/18/23  Pt will demonstrate 3+/5 hip flexor strength on the R to decrease fall risk.  Baseline:  Goal status: INITIAL  2.  Pt will be able to get in and out of the car without manually lifting her R leg. Baseline:  Goal status: INITIAL  3.  Pt will be able to ascend/descend a flight of steps with out assistance or fear of falling.  Baseline:  Goal status: INITIAL  4.  Pt will demonstrate 3/5 bilateral hip extensor strength to decrease fall risk.  Baseline:  Goal status: INITIAL  5.  Pt will be independent in a home exercise program for continued stretching and strengthening.  Baseline:  Goal status: INITIAL   PLAN:  PT FREQUENCY: 2x/week  PT DURATION: 8 weeks  PLANNED INTERVENTIONS: Therapeutic exercises, Therapeutic activity, Neuromuscular re-education, Balance training, Gait training, Patient/Family education, Self Care, Joint mobilization, Stair training, and Manual therapy  PLAN FOR NEXT SESSION: BERG and add goal as needed, NuStep, LE strengthening, balance exercises   [employer], PT 12/31/2022, 12:59 PM

## 2022-12-31 NOTE — Progress Notes (Signed)
[facility]   [phone] [phone]   Clinic Follow up Note   Patient Care Team: Patient, No Pcp Per as PCP - General (General Practice) [name], NP as Nurse Practitioner (Oncology) [name], MD as Consulting Physician (Oncology) [name], RN (Inactive) as Oncology Nurse Navigator  Date of Service:  01/01/2023  CHIEF COMPLAINT: f/u of metastatic rectal cancer, h/o colon cancer          CURRENT THERAPY:   FOLFOX with bevacizumab, q14d, starting 03/28/22  ASSESSMENT: Brittney Spencer is a 72 y.o. 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  -She began first-line chemo with dose-reduced FOLFIRI and Beva in 2/22.  -we changed to FOLFOX with continued Beva on 03/28/22. She tolerated well overall with some fatigue and cold sensitivity. -I personally reviewed her restaging CT from 10/07/2022 which showed overall SD in liver and rectum, stable and indermininate lung nodules, no other new lesions  -She is tolerating chemotherapy overall well, will continue. -I personally reviewed her restaging CT scan from October 07, 2022, which showed overall stable disease.  She has multiple small lung nodules, indeterminate, including one 7 mm nodule, will continue monitoring. -She has developed neuropathy in her hands and feet, grade 2, we stopped oxaliplatin in 09/2022 -her restaging CT from 12/30/2022 showed cancer progression in her liver, I personally reviewed her scan images with patient. -We discussed option of adding oxaliplatin back, or try third line therapy with Lonsurf and bevacizumab.  Cancer of ascending colon (HCC) diagnosed in 12/2015. S/p right hemicolectomy with right salpingo oophorectomy and adjuvant ChemoRT. -Her 04/2019 CT scan was NED  -With rectal cancer diagnosis in 2022, her  PET from 07/12/20 indicates soft tissue mass within the right iliac fossa is noted and consistent with local tumor recurrence from previous ascending colon tumor.  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 -I discuss previous tumor maker - I discuss options of different chemo regiments, adding oxaliplatin  or try third line therapy with Lonsurf and bevacizumab. -no treatment today due to changing chemo regiment - start 5FU with Oxaliplatin  at reduce does due to increase neuropathy on 7/10 -schedule per IS  SUMMARY OF ONCOLOGIC HISTORY: Oncology History Overview Note  Cancer Staging Cancer of ascending colon [facility]) Staging form: Colon and Rectum, AJCC 7th Edition - Clinical stage from 02/07/2016: Stage IIIC (T4b, N1b, M0) - Signed by [name], MD on 03/04/2016 Laterality: Right Residual tumor (R): R2 - Macroscopic    Cancer of ascending colon (HCC)  10/25/2015 Imaging   CT ABD/PELVIS:  Inflammatory changes inferior to the cecal tip appear improved, there is still irregular soft tissue thickening of the cecal tip, and there are adjacent prominent lymph nodes in the ileocolonic mesentery, measuring 13 mm on image 49 and 8 mm on image 52. In addition, there is a 2.5 x 1.8 cm nodule on image 46 which has central low density. Therefore, these findings are moderately suspicious for an underlying cecal malignancy with perforation.  01/19/2016 Procedure   COLONOSCOPY per Dr. [name]: Fungating, ulcerated mass almost obstructing mid ascending colon   01/19/2016 Initial Biopsy   Diagnosis Surgical [P], cecal mass - INVASIVE ADENOCARCINOMA WITH ULCERATION. - SEE  COMMENT.   02/05/2016 Tumor Marker   Patient's tumor was tested for the following markers: CEA Results of the tumor marker test revealed 5.7.   02/07/2016 Initial Diagnosis   Cancer of ascending colon (HCC)   02/07/2016 Definitive Surgery   Laparoscopic assisted right hemicolectomy and right salpingo oopherectomy--[name]   02/07/2016 Pathologic Stage   p T4 N1b   2/43 nodes +   02/07/2016 Pathology Results   MMR normal; G2 adenocarcinoma;proximal & distal margins negative; soft tissue mass on pelvic sidewall + for adenocarcinoma with positive margin MSI Stable   03/08/2016 Imaging   CT chest negative for metastasis.    03/19/2016 - 04/25/2016 Radiation Therapy   Adjuvant irradiation, 50 gray in 28 fractions   03/19/2016 - 04/22/2016 Chemotherapy   Xeloda 1500 mg twice daily, started on 03/19/2016, dose reduced to 1000 mg twice daily from week 3 due to neutropenia, and patient stopped 3 days before last dose radiation due to difficulty swallowing the pill    05/20/2016 -  Adjuvant Chemotherapy   Patient declined adjuvant chemotherapy   09/16/2016 Imaging   CT CAP w Contrast 1. No evidence of local tumor recurrence at the ileocolic anastomosis. 2. No findings suspicious for metastatic disease in the chest, abdomen or pelvis. 3. Nonspecific trace free fluid in the pelvic cul-de-sac. 4. Stable solitary 3 mm right upper lobe pulmonary nodule, for which 6 month stability has been demonstrated, probably benign. 5. Additional findings include stable right posterior pericardial cyst and small calcified uterine fibroids.   05/13/2017 Imaging   CT CAP W Contrast 05/13/17 IMPRESSION: 1. No current findings of residual or recurrent malignancy. 2. Mild prominence of stool throughout the colon. Nondistended portions of the rectum. 3. Several tiny pulmonary nodules are stable from the earliest available comparison of 03/08/2016 and probably benign, but may merit surveillance. 4. Other  imaging findings of potential clinical significance: Old granulomatous disease. Aortoiliac atherosclerotic vascular disease. Lumbar spondylosis and degenerative disc disease. Stable amount of trace free pelvic fluid.   04/27/2018 Imaging   04/27/2018 CT CAP IMPRESSION: Stable exam. No evidence of recurrent or metastatic carcinoma within the chest, abdomen, or pelvis   04/19/2019 Imaging   CT CAP W Contrast  IMPRESSION: Chest Impression:   1. No evidence of thoracic metastasis. 2. Stable small bilateral pulmonary nodules.   Abdomen / Pelvis Impression:   1. No evidence local colorectal carcinoma recurrence or metastasis in the abdomen pelvis. 2. Post RIGHT hemicolectomy.   01/22/2021 Imaging   CT CAP  IMPRESSION: CT CHEST IMPRESSION   1. Similar nonspecific pulmonary nodules. 2. New posterior left upper lobe reticulonodular opacity, suspicious for interval mild infection or inflammation. 3. No thoracic adenopathy.   CT ABDOMEN AND PELVIS IMPRESSION   1. Further decrease in size of high left hepatic lobe 3 mm low-density lesion. No new or progressive metastatic disease within the abdomen or pelvis. 2. Similar trace free pelvic fluid. 3. Similar nonspecific mid rectal wall thickening. 4.  Aortic Atherosclerosis (ICD10-I70.0).   04/23/2021 Imaging   CT CAP  IMPRESSION: 1. Treated metastatic lesion between segments 2 and 3 of the liver, slightly smaller and less distinct than prior examination. No other signs of definite metastatic disease elsewhere in the abdomen or pelvis. 2. Multiple small pulmonary nodules, stable compared to  the prior examination, favored to be benign. No definitive findings to suggest metastatic disease to the thorax. 3. Aortic atherosclerosis. 4. Additional incidental findings, as above.   08/13/2021 Imaging   EXAM: CT CHEST, ABDOMEN, AND PELVIS WITH CONTRAST  IMPRESSION: 1. A previously seen PET avid lesion of the anterior left lobe  of the liver, hepatic segment II, is no longer discretely appreciable consistent with treatment response of a hepatic metastasis. 2. No evidence of new metastatic disease in the chest, abdomen, or pelvis. 3. Interval increase in a small focus of consolidation and nodularity of the medial left upper lobe, consistent with minimal, ongoing atypical infection. Additional tiny bilateral pulmonary nodules are stable and almost certainly incidental benign. Attention on follow-up. 4. Status post right hemicolectomy and ileocolic anastomosis.   07/02/2022 Imaging    IMPRESSION: CHEST IMPRESSION:   1. No evidence of thoracic metastasis. 2. Stable small pulmonary nodules.   PELVIS IMPRESSION:   1. Stable to slight decrease in size of subcapsular lesion in the RIGHT hepatic lobe. 2. No evidence of new or progressive disease in the abdomen pelvis. 3.  Aortic Atherosclerosis (ICD10-I70.0).     12/31/2022 Imaging    IMPRESSION: 1. Right hepatic lobe metastasis has undergone mild-to-moderate enlargement since 10/07/2022. A left hepatic lobe 5 mm lesion is not readily apparent on the prior, suspicious for a new metastasis. 2. Similar nonspecific tiny pulmonary nodules. 3. Similar amorphous soft tissue thickening within the posterior superior right hemipelvis, hypermetabolic on prior PET and indeterminate for residual disease versus scarring. 4. Similar equivocal soft tissue fullness within the rectum. 5. Increased size of an abdominopelvic ventral wall nodule for which subcutaneous metastasis are a concern. 6. New trace right pelvic fluid. 7. Incidental findings, including: Aortic Atherosclerosis (ICD10-I70.0). Possible constipation   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)  06/15/2020 Procedure   Screening Colonoscopy by Dr [name]  IMPRESSION - Decreased sphincter tone and internal hemorrhoids that prolapse with straining, but require manual replacement into the anal canal (Grade III)  found on digital rectal exam. - Patent side-to-side ileo-colonic anastomosis, characterized by healthy appearing mucosa. - The examined portion of the ileum was normal. - One diminutive polyp in the proximal transverse colon, removed with a cold biopsy forceps. Resected and retrieved. - Likely malignant partially obstructing tumor in the mid rectum. Biopsied. Tattooed. - The examination was otherwise normal on direct and retroflexion views.   06/15/2020 Initial Biopsy   Diagnosis 1. Transverse Colon Polyp - HYPERPLASTIC POLYP 2. Rectum, biopsy - ADENOCARCINOMA ARISING IN A TUBULAR ADENOMA WITH HIGH-GRADE DYSPLASIA. SEE NOTE Diagnosis Note 2. Dr. Luisa Hart reviewed the case and concurs with the diagnosis. Dr. [name] was notified on 06/16/2020.   06/28/2020 Imaging   CT CAP  IMPRESSION: 1. New low-density focus in the anterior aspect of the lateral segment LEFT hepatic lobe measuring 1.2 x 1.0 cm, compatible with small metastatic lesion in the LEFT hepatic lobe. 2. Soft tissue in the RIGHT iliac fossa following RIGHT hemicolectomy invades the psoas musculature and is slowly enlarging over time, more linear on the prior study now highly concerning for recurrence/metastasis to this location. 3. Signs of enteritis, potentially post radiation changes of the small bowel. Tethered small bowel in the RIGHT lower quadrant shows focal thickening and narrowing suspicious for small bowel involvement and developing partial obstruction though currently contrast passes beyond this point into the colon. 4. Rectal thickening in this patient with known rectal mass as described. 5. No evidence of metastatic disease in the chest. 6.  Stable small pulmonary nodules. 7.  and aortic atherosclerosis.   Aortic Atherosclerosis (ICD10-I70.0) and Emphysema (ICD10-J43.9).   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 Exam positive for FDG  avid rectal tumor which corresponds to the recent colonoscopy findings. 2. FDG avid soft tissue mass within the right iliac fossa is noted and consistent with local tumor recurrence from previous ascending colon tumor. 3. Lateral segment left lobe of liver lesion is FDG avid concerning for liver metastasis. 4. No specific findings identified to suggest metastatic disease to the chest.   08/10/2020 -  Chemotherapy   First-line FOLFIRI q2weeks starting 08/10/20. dose reduced with cycle 1. Irinotecan/5FU increased and Bevacizumab added with cycle 2 on 08/23/2020    08/17/2020 - 03/09/2022 Chemotherapy   Patient is on Treatment Plan : COLORECTAL FOLFIRI + Bevacizumab q14d     10/27/2020 Imaging   CT CAP  IMPRESSION: 1. Interval decrease in size of the hypermetabolic left hepatic lesion, consistent with metastatic disease. No new liver lesion evident. 2. Interval resolution of the hypermetabolic soft tissue lesion along the right iliac fossa with no measurable soft tissue lesion remaining at this location today. 3. Similar appearance of soft tissue fullness in the rectum at the site of the hypermetabolic lesion seen previously. 4. Stable tiny bilateral pulmonary nodules. Continued attention on follow-up recommended. 5. Small volume free fluid in the pelvis. 6. Aortic Atherosclerosis (ICD10-I70.0).   01/22/2021 Imaging   CT CAP  IMPRESSION: CT CHEST IMPRESSION   1. Similar nonspecific pulmonary nodules. 2. New posterior left upper lobe reticulonodular opacity, suspicious for interval mild infection or inflammation. 3. No thoracic adenopathy.   CT ABDOMEN AND PELVIS IMPRESSION   1. Further decrease in size of high left hepatic lobe 3 mm low-density lesion. No new or progressive metastatic disease within the abdomen or pelvis. 2. Similar trace free pelvic fluid. 3. Similar nonspecific mid rectal wall thickening. 4.  Aortic Atherosclerosis (ICD10-I70.0).   04/23/2021 Imaging   CT  CAP  IMPRESSION: 1. Treated metastatic lesion between segments 2 and 3 of the liver, slightly smaller and less distinct than prior examination. No other signs of definite metastatic disease elsewhere in the abdomen or pelvis. 2. Multiple small pulmonary nodules, stable compared to the prior examination, favored to be benign. No definitive findings to suggest metastatic disease to the thorax. 3. Aortic atherosclerosis. 4. Additional incidental findings, as above.   08/13/2021 Imaging   EXAM: CT CHEST, ABDOMEN, AND PELVIS WITH CONTRAST  IMPRESSION: 1. A previously seen PET avid lesion of the anterior left lobe of the liver, hepatic segment II, is no longer discretely appreciable consistent with treatment response of a hepatic metastasis. 2. No evidence of new metastatic disease in the chest, abdomen, or pelvis. 3. Interval increase in a small focus of consolidation and nodularity of the medial left upper lobe, consistent with minimal, ongoing atypical infection. Additional tiny bilateral pulmonary nodules are stable and almost certainly incidental benign. Attention on follow-up. 4. Status post right hemicolectomy and ileocolic anastomosis.   03/28/2022 -  Chemotherapy   Patient is on Treatment Plan : COLORECTAL FOLFOX + Bevacizumab q14d     07/02/2022 Imaging    IMPRESSION: CHEST IMPRESSION:   1. No evidence of thoracic metastasis. 2. Stable small pulmonary nodules.   PELVIS IMPRESSION:   1. Stable to slight decrease in size of subcapsular lesion in the RIGHT hepatic lobe. 2. No evidence of new or progressive disease  in the abdomen pelvis.   10/07/2022 Imaging    IMPRESSION: 1. Stable hypovascular mass in the periphery of the right lobe of the liver, which likely represents a treated metastatic lesion. Stable subcentimeter lesion just medial to this is also similar to the recent prior examination. No new hepatic lesions are otherwise noted. 2. Persistent but stable  poorly defined soft tissue thickening in the right lower quadrant associated with multiple small bowel loops and the overlying iliopsoas musculature, which corresponds to focal hypermetabolism on remote prior PET-CT. Given the stability, this likely represents a treated metastatic lesion. Continued attention on follow-up studies is recommended. 3. Persistent mass-like thickening in the proximal rectum corresponding to previously noted hypermetabolic rectal neoplasm on prior PET-CT. This currently measures approximately 2.8 x 2.3 cm and is slightly more apparent than the most recent prior study. 4. Multiple small pulmonary nodules generally stable compared to the prior study, with exception of a new branching nodule in the anterior aspect of the left upper lobe measuring 7 x 3 mm (mean diameter 5 mm). This is nonspecific. Close attention on follow-up studies is recommended to ensure stability. 5. Aortic atherosclerosis. 6. Additional incidental findings, as above.     12/31/2022 Imaging    IMPRESSION: 1. Right hepatic lobe metastasis has undergone mild-to-moderate enlargement since 10/07/2022. A left hepatic lobe 5 mm lesion is not readily apparent on the prior, suspicious for a new metastasis. 2. Similar nonspecific tiny pulmonary nodules. 3. Similar amorphous soft tissue thickening within the posterior superior right hemipelvis, hypermetabolic on prior PET and indeterminate for residual disease versus scarring. 4. Similar equivocal soft tissue fullness within the rectum. 5. Increased size of an abdominopelvic ventral wall nodule for which subcutaneous metastasis are a concern. 6. New trace right pelvic fluid. 7. Incidental findings, including: Aortic Atherosclerosis (ICD10-I70.0). Possible constipation      INTERVAL HISTORY:  Brittney Spencer is here for a follow up of metastatic rectal cancer, h/o colon cancer.   She was last seen by me on 12/18/2022. She presents to the  clinic alone. Pt state that she has an event plan for July. Pt  state that her neuropathy has gotten a little worse in her finger tips.She state that she is still able to use her fingers to do day to day activities.       All other systems were reviewed with the patient and are negative.  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 Some residual intraretinal fluid remains, very small localized subfoveal of fluid remains although this continues to slowly resorb. We'll continue to observe.   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  ALLERGIES:  is allergic to venofer [iron sucrose], fish allergy, peanut-containing drug products, soy allergy, and buspirone.  MEDICATIONS:  Current Outpatient Medications  Medication Sig  Dispense Refill   ALPRAZolam (XANAX) 0.25 MG tablet Take 1 tablet (0.25 mg total) by mouth daily as needed for anxiety. 30 tablet 0   capecitabine (XELODA) 500 MG tablet Take 3 tablets (1,500 mg total) by mouth 2 (two) times daily after a meal. Take within 30 minutes after meals. Take for 14 days on, then off for 7 days. Repeat every 21 days. 84 tablet 1   Cholecalciferol (VITAMIN D3 PO) Take by mouth daily.     docusate sodium (COLACE) 100 MG capsule 1 capsule as needed     lidocaine-prilocaine (EMLA) cream Apply 1 Application topically as needed. 30 g 1   mirtazapine (REMERON) 7.5 MG tablet TAKE 1 TABLET BY MOUTH AT BEDTIME. 90 tablet 1   NORVASC 2.5 MG tablet TAKE 1 TABLET BY MOUTH EVERY DAY 90 tablet 1   ondansetron (ZOFRAN) 8 MG tablet Take 1 tablet (8 mg total) by mouth every 8 (eight) hours as needed for nausea or vomiting. 20 tablet 2   prochlorperazine (COMPAZINE) 10 MG tablet Take 1 tablet (10 mg total) by mouth every 6 (six) hours as needed for nausea or vomiting. 30 tablet 2   Current Facility-Administered Medications  Medication Dose Route Frequency Provider Last Rate Last Admin   0.9 %  sodium chloride infusion  500 mL Intravenous Once Danis, Starr Lake III, MD        PHYSICAL EXAMINATION: ECOG PERFORMANCE STATUS: {CHL ONC ECOG ZO:[id number]}  Vitals:   01/01/23 0809  BP: 130/75  Pulse: 83  Resp: 18  Temp: 98.4 F (36.9 C)  SpO2: 100%   Wt Readings from Last 3 Encounters:  01/01/23 105 lb 9.6 oz (47.9 kg)  12/18/22 102 lb 11.2 oz (46.6 kg)  12/05/22 102 lb 14.4 oz (46.7 kg)     GENERAL:alert, no distress and comfortable SKIN: skin color normal, no rashes or significant lesions EYES: normal, Conjunctiva are pink and non-injected, sclera clear  NEURO: alert & oriented x 3 with fluent speech  LABORATORY DATA:  I have reviewed the data as listed    Latest Ref Rng & Units 12/30/2022    8:50 AM 12/18/2022    9:31 AM 12/05/2022    9:21 AM  CBC  WBC 4.0 - 10.5 K/uL 6.3   4.8  3.1   Hemoglobin 12.0 - 15.0 g/dL 81.1  91.4  78.2   Hematocrit 36.0 - 46.0 % 32.9  33.8  32.6   Platelets 150 - 400 K/uL 110  109  143         Latest Ref Rng & Units 12/30/2022    8:50 AM 12/18/2022    9:31 AM 12/05/2022    9:21 AM  CMP  Glucose 70 - 99 mg/dL 81  93  92   BUN 8 - 23 mg/dL 12  11  11    Creatinine 0.44 - 1.00 mg/dL 9.56  2.13  0.86   Sodium 135 -  145 mmol/L 140  137  137   Potassium 3.5 - 5.1 mmol/L 4.1  4.3  4.1   Chloride 98 - 111 mmol/L 107  104  105   CO2 22 - 32 mmol/L 28  29  28    Calcium 8.9 - 10.3 mg/dL 8.5  9.1  8.7   Total Protein 6.5 - 8.1 g/dL 7.0  6.7  7.2   Total Bilirubin 0.3 - 1.2 mg/dL 0.4  0.4  0.3   Alkaline Phos 38 - 126 U/L 94  91  82   AST 15 - 41 U/L 16  16  22    ALT 0 - 44 U/L 7  8  12        RADIOGRAPHIC STUDIES: I have personally reviewed the radiological images as listed and agreed with the findings in the report. CT CHEST ABDOMEN PELVIS W CONTRAST  Result Date: 12/31/2022 CLINICAL DATA:  Metastatic rectal carcinoma. Known liver metastasis. * Tracking Code: BO * EXAM: CT CHEST, ABDOMEN, AND PELVIS WITH CONTRAST TECHNIQUE: Multidetector CT imaging of the chest, abdomen and pelvis was performed following the standard protocol during bolus administration of intravenous contrast. RADIATION DOSE REDUCTION: This exam was performed according to the departmental dose-optimization program which includes automated exposure control, adjustment of the mA and/or kV according to patient size and/or use of iterative reconstruction technique. CONTRAST:  80mL OMNIPAQUE IOHEXOL 300 MG/ML  SOLN COMPARISON:  10/07/2022 FINDINGS: CT CHEST FINDINGS Cardiovascular: Right Port-A-Cath tip mid right atrium. Aortic atherosclerosis. Tortuous thoracic aorta. Normal heart size, without pericardial effusion. No central pulmonary embolism, on this non-dedicated study. Mediastinum/Nodes: No supraclavicular adenopathy. No mediastinal or hilar adenopathy. Cystic structure at  the right lower mediastinum is similar at 2.1 x 1.7 cm on 34/2, favoring a pericardial cyst. Lungs/Pleura: No pleural fluid. Minimal biapical pleuroparenchymal scarring. Millimetric pulmonary nodules are again identified and similar. The largest is in the lingula at 4 mm on 58/6. Musculoskeletal: No acute osseous abnormality. CT ABDOMEN PELVIS FINDINGS Hepatobiliary: The dominant subcapsular right hepatic lobe hypoattenuating lesion measures 3.0 x 2.5 cm on 55/2 versus 2.3 x 1.7 cm on the prior. An adjacent 3 mm satellite lesion is unchanged. A segment 2-3 5 mm hypoattenuating lesion on 50/2 is not readily apparent on the prior. Normal gallbladder, without biliary ductal dilatation. Pancreas: Normal, without mass or ductal dilatation. Spleen: Old granulomatous disease within the spleen. Adrenals/Urinary Tract: Normal adrenal glands. Bilateral too small to characterize renal lesions. Lower pole right renal 2.4 cm cyst or minimally complex cyst . In the absence of clinically indicated signs/symptoms require(s) no independent follow-up. No hydronephrosis. Normal urinary bladder. Stomach/Bowel: Normal stomach, without wall thickening. Possible soft tissue fullness within the mid rectum on 97/2, relatively similar. This area is underdistended. No evidence of high-grade obstruction. Large colonic stool burden proximally does suggest constipation. Surgical changes of partial right hemicolectomy. Normal small bowel. Vascular/Lymphatic: Aortic atherosclerosis. No abdominopelvic adenopathy. Reproductive: Retroverted uterus.  No adnexal mass. Other: Trace right pelvic fluid is new on 98/2. No significant change in subtle amorphous soft tissue thickening about the right iliopsoas muscle anteriorly including on 84/2. This can not be measured secondary to its amorphous appearance. Ventral abdominopelvic wall nodule measures 1.4 cm on 77/2 is more well-defined than at 8 mm on the prior. Musculoskeletal: No acute osseous  abnormality. IMPRESSION: 1. Right hepatic lobe metastasis has undergone mild-to-moderate enlargement since 10/07/2022. A left hepatic lobe 5 mm lesion is not readily apparent on the prior, suspicious for a new metastasis. 2. Similar nonspecific  tiny pulmonary nodules. 3. Similar amorphous soft tissue thickening within the posterior superior right hemipelvis, hypermetabolic on prior PET and indeterminate for residual disease versus scarring. 4. Similar equivocal soft tissue fullness within the rectum. 5. Increased size of an abdominopelvic ventral wall nodule for which subcutaneous metastasis are a concern. 6. New trace right pelvic fluid. 7. Incidental findings, including: Aortic Atherosclerosis (ICD10-I70.0). Possible constipation. Electronically Signed   By: Jeronimo Greaves M.D.   On: 12/31/2022 13:37      No orders of the defined types were placed in this encounter.  All questions were answered. The patient knows to call the clinic with any problems, questions or concerns. No barriers to learning was detected. The total time spent in the appointment was {CHL ONC TIME VISIT - WUJWJ:[id number]}.     Salome Holmes, CMA 01/01/2023   I, Monica Martinez, CMA, am acting as scribe for [name], MD.   {Add scribe attestation statement}

## 2023-01-01 ENCOUNTER — Encounter: Payer: Self-pay | Admitting: Hematology

## 2023-01-01 ENCOUNTER — Inpatient Hospital Stay (HOSPITAL_BASED_OUTPATIENT_CLINIC_OR_DEPARTMENT_OTHER): Payer: Federal, State, Local not specified - PPO | Admitting: Hematology

## 2023-01-01 ENCOUNTER — Other Ambulatory Visit: Payer: Self-pay

## 2023-01-01 ENCOUNTER — Inpatient Hospital Stay: Payer: Federal, State, Local not specified - PPO

## 2023-01-01 VITALS — BP 130/75 | HR 83 | Temp 98.4°F | Resp 18 | Ht 66.0 in | Wt 105.6 lb

## 2023-01-01 DIAGNOSIS — G620 Drug-induced polyneuropathy: Secondary | ICD-10-CM | POA: Diagnosis not present

## 2023-01-01 DIAGNOSIS — T451X5D Adverse effect of antineoplastic and immunosuppressive drugs, subsequent encounter: Secondary | ICD-10-CM | POA: Diagnosis not present

## 2023-01-01 DIAGNOSIS — Z9221 Personal history of antineoplastic chemotherapy: Secondary | ICD-10-CM | POA: Diagnosis not present

## 2023-01-01 DIAGNOSIS — Z923 Personal history of irradiation: Secondary | ICD-10-CM | POA: Diagnosis not present

## 2023-01-01 DIAGNOSIS — Z66 Do not resuscitate: Secondary | ICD-10-CM | POA: Diagnosis not present

## 2023-01-01 DIAGNOSIS — Z5111 Encounter for antineoplastic chemotherapy: Secondary | ICD-10-CM | POA: Diagnosis present

## 2023-01-01 DIAGNOSIS — Z5189 Encounter for other specified aftercare: Secondary | ICD-10-CM | POA: Diagnosis not present

## 2023-01-01 DIAGNOSIS — Z9049 Acquired absence of other specified parts of digestive tract: Secondary | ICD-10-CM | POA: Diagnosis not present

## 2023-01-01 DIAGNOSIS — Z5112 Encounter for antineoplastic immunotherapy: Secondary | ICD-10-CM | POA: Diagnosis present

## 2023-01-01 DIAGNOSIS — C20 Malignant neoplasm of rectum: Secondary | ICD-10-CM | POA: Diagnosis present

## 2023-01-01 DIAGNOSIS — Z1509 Genetic susceptibility to other malignant neoplasm: Secondary | ICD-10-CM | POA: Diagnosis not present

## 2023-01-01 DIAGNOSIS — C787 Secondary malignant neoplasm of liver and intrahepatic bile duct: Secondary | ICD-10-CM | POA: Diagnosis not present

## 2023-01-01 DIAGNOSIS — C182 Malignant neoplasm of ascending colon: Secondary | ICD-10-CM

## 2023-01-01 DIAGNOSIS — R911 Solitary pulmonary nodule: Secondary | ICD-10-CM | POA: Diagnosis not present

## 2023-01-02 ENCOUNTER — Encounter: Payer: Self-pay | Admitting: Hematology

## 2023-01-06 ENCOUNTER — Ambulatory Visit: Payer: Federal, State, Local not specified - PPO

## 2023-01-06 DIAGNOSIS — C20 Malignant neoplasm of rectum: Secondary | ICD-10-CM

## 2023-01-06 DIAGNOSIS — R262 Difficulty in walking, not elsewhere classified: Secondary | ICD-10-CM

## 2023-01-06 DIAGNOSIS — M6281 Muscle weakness (generalized): Secondary | ICD-10-CM | POA: Diagnosis not present

## 2023-01-06 DIAGNOSIS — R209 Unspecified disturbances of skin sensation: Secondary | ICD-10-CM

## 2023-01-06 NOTE — Therapy (Signed)
OUTPATIENT PHYSICAL THERAPY ONCOLOGY TREATMENT  Patient Name: Brittney Spencer MRN: [id number] DOB:1951-05-22, 72 y.o., female Today's Date: 01/06/2023  END OF SESSION:  PT End of Session - 01/06/23 0907     Visit Number 3    Number of Visits 17    Date for PT Re-Evaluation 02/18/23    PT Start Time 0908   pt late   PT Stop Time 0956    PT Time Calculation (min) 48 min    Activity Tolerance Patient tolerated treatment well    Behavior During Therapy [facility] for tasks assessed/performed              Past Medical History:  Diagnosis Date   AAA (abdominal aortic aneurysm) (HCC)    infrarenal 4.1 cmper s-9-19 scan on chart   Anemia    hx of   Anxiety    has PRN meds   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 Some residual intraretinal fluid remains, very small localized subfoveal of fluid remains although this continues to slowly resorb. We'll continue to observe.   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   Resolved March 2021 post vitrectomy   Past Surgical History:  Procedure Laterality Date   COLONOSCOPY  2018   HD-hams   COLONSCOPY  12/2015   IR IMAGING GUIDED PORT INSERTION  08/04/2020   LAPAROSCOPIC RIGHT HEMI COLECTOMY Right 02/07/2016   Procedure: LAPAROSCOPIC ASSISTED RIGHT HEMI COLECTOMY AND RIGHT SALPINGO OOPHERECTOMY;  Surgeon: Glenna Fellows, MD;  Location: [facility];  Service: General;  Laterality: Right;   RETINAL DETACHMENT SURGERY  09/2019   Patient Active Problem List   Diagnosis Date Noted   Peripheral neuropathy due to chemotherapy (HCC) 11/14/2022   DNR (do  not resuscitate) 04/25/2022   Encounter for antineoplastic chemotherapy 11/29/2021   Macular hole of right eye 02/20/2021   Macular pucker, right eye 02/20/2021   Port-A-Cath in place 08/10/2020   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) 07/04/2020   History of vitrectomy 03/28/2020   Vitreomacular traction, left 03/28/2020   Nuclear sclerotic cataract of left eye 03/28/2020   Follow-up examination after eye surgery 10/21/2019   Essential hypertension 03/26/2016   Iron deficiency anemia due to chronic blood loss 03/26/2016   Cancer of ascending colon (HCC) 02/07/2016   Low hemoglobin 01/05/2016   Perforated appendicitis 09/15/2015    PCP: none  REFERRING PROVIDER: [name], MD  REFERRING DIAG: C20,C78.7 (ICD-10-CM) - Rectal cancer metastasized to liver [facility])  THERAPY DIAG:  Muscle weakness (generalized)  Unspecified disturbances of skin sensation  Difficulty in walking, not elsewhere classified  Rectal cancer metastasized to liver [facility])  ONSET DATE: 11/23/22  Rationale for Evaluation and Treatment: Rehabilitation  SUBJECTIVE:  SUBJECTIVE STATEMENT: I haven't tried the exercises given yet.  PERTINENT HISTORY:  Cancer of ascending colon (HCC), diagnosed in 12/2015. S/p right hemicolectomy with right salpingo oophorectomy and adjuvant chemo and radiation.Diagnosed with rectal cancer with mets to the liver in 2021. her PET from 07/12/20 indicates soft tissue mass within the right iliac fossa is noted and consistent with local tumor recurrence from previous ascending colon tumor. Restaging on 10/07/22 showed overall stable disease with multiple small lung nodules. Currently on chemo and has neuropathy and anemia  PAIN:  Are you having pain? No not presently, PRECAUTIONS: Other: metastatic rectal  cancer to liver  WEIGHT BEARING RESTRICTIONS: No  FALLS:  Has patient fallen in last 6 months? No  LIVING ENVIRONMENT: Lives with: lives alone Lives in: House/apartment Stairs: No;  Has following equipment at home: Grab bars  OCCUPATION: retired  LEISURE: pt intentionally walks  HAND DOMINANCE: right   PRIOR LEVEL OF FUNCTION: Independent  PATIENT GOALS: "to see if it offers any other suggestions as far as the body and what else I can do for the neuropathy"   OBJECTIVE:  COGNITION: Overall cognitive status: Within functional limits for tasks assessed    SENSATION: Tingling in bilateral feet and hands  POSTURE: forward head and rounded shoulders  UPPER EXTREMITY AROM/PROM: WFL  UPPER EXTREMITY STRENGTH: 5/5   LOWER EXTREMITY MMT:  MMT Right eval  Hip flexion 2+/5  Hip extension 2+/5  Hip abduction 2+/5  Hip adduction   Hip internal rotation   Hip external rotation   Knee flexion 3+/5  Knee extension 5/5  Ankle dorsiflexion 5/5  Ankle plantarflexion   Ankle inversion   Ankle eversion    (Blank rows = not tested)  MMT LEFT eval  Hip flexion 3+/5  Hip extension 2+/5  Hip abduction 3+/5  Hip adduction   Hip internal rotation   Hip external rotation   Knee flexion 4/5  Knee extension 5/5  Ankle dorsiflexion 5/5  Ankle plantarflexion   Ankle inversion   Ankle eversion    (Blank rows = not tested)     FUNCTIONAL TESTS:  30 seconds chair stand test 11 reps without use of UEs which is average for her age  81/[birth year]: [name] Balance Test Sit to Stand: Able to stand without using hands and stabilize independently Standing Unsupported: Able to stand safely 2 minutes Sitting with Back Unsupported but Feet Supported on Floor or Stool: Able to sit safely and securely 2 minutes Stand to Sit: Sits safely with minimal use of hands Transfers: Able to transfer safely, minor use of hands Standing Unsupported with Eyes Closed: Able to stand 10 seconds  safely Standing Ubsupported with Feet Together: Able to place feet together independently and stand 1 minute safely From Standing, Reach Forward with Outstretched Arm: Can reach forward >12 cm safely (5") From Standing Position, Pick up Object from Floor: Able to pick up shoe safely and easily From Standing Position, Turn to Look Behind Over each Shoulder: Turn sideways only but maintains balance Turn 360 Degrees: Able to turn 360 degrees safely but slowly Standing Unsupported, Alternately Place Feet on Step/Stool: Able to stand independently and safely and complete 8 steps in 20 seconds Standing Unsupported, One Foot in Front: Needs help to step but can hold 15 seconds Standing on One Leg: Tries to lift leg/unable to hold 3 seconds but remains standing independently Total Score: 45   GAIT: Distance walked: 25 feet Assistive device utilized: None Level of assistance: SBA Comments: foot flat, slightly  unsteady appearing  SINGLE LIMB STANCE:  2 sec on R 3 sec on L  TODAY'S TREATMENT:                                                                                                                                         DATE:   01/06/2023 Nu step seat 8, UE 9 lev 2-3 x 5 min 225 steps Standing march 2 x 10 intermittent hand hold Heel raises no HH x 15 Bilateral gastroc stretches 2 x 20 sec with runners stretch Step onto ax forward x 10 ea, lateral x 10 ea LAQ 3 # x 10 ea Seated HS stretch x 3 ea Airex beam forward x 3 laps, backwards x 3 laps intermittent HH Tandem stance x 3 ea with intermittent HH  12/31/22:  Performed Berg Balance test - see above pt scored a 45/50, s he had difficulty with balance with turning quickly and reaching forward with feet planted Instructed pt in seated hamstring stretch in sitting- pt required max v/c and t/c cues for this to avoid scapular protraction, twisting, keeping knee straight and foot pulled back with 20 sec holds - pt felt tight  bilaterally Instructed in quad stretch with chair behind her at the counter with pt able to hold about 10 sec with good stretch felt bilaterally Tandem stance in the corner and educated pt to do this in the kitchen facing the corner with counter on either side for support  Created HEP  12/24/22: quad stretch in standing with HHA at counter  PATIENT EDUCATION:  Education details:hamstring stretch without protraction of scapula, quad stretch with chair for leg support, tandem stance in corner Person educated: Patient Education method: Explanation Education comprehension: verbalized understanding  HOME EXERCISE PROGRAM: Sit to stands without use of UEs Access Code: [email or web address] URL: [email or web address] Date: 12/31/2022 Prepared by: Leonette Most  Exercises - Seated Hamstring Stretch  - 1 x daily - 7 x weekly - 1 sets - 3-5 reps - work up to 30 sec hold - Theatre manager with Chair and Counter Support  - 1 x daily - 7 x weekly - 1 sets - 3-5 reps - work up to 30 sec  hold - Standing Tandem Balance with Counter Support  - 1 x daily - 7 x weekly - 1 sets - 5-10 reps - work up to 30 sec  hold  ASSESSMENT:  CLINICAL IMPRESSION:  Pt did well with exercises instructed but required moderate VC's for positioning, and focus. Balance activities on ax more challenging for right LE than left. Pt felt fine after treatment but felt she had been challenged.  OBJECTIVE IMPAIRMENTS: decreased activity tolerance, decreased balance, difficulty walking, decreased strength, increased fascial restrictions, impaired flexibility, impaired sensation, and postural dysfunction.   ACTIVITY LIMITATIONS: stairs and locomotion level  PARTICIPATION LIMITATIONS: cleaning, driving, and community activity  PERSONAL FACTORS: Age, Fitness, and Time since onset  of injury/illness/exacerbation are also affecting patient's functional outcome.   REHAB POTENTIAL: Good  CLINICAL DECISION MAKING:  Stable/uncomplicated  EVALUATION COMPLEXITY: Low  GOALS: Goals reviewed with patient? Yes  SHORT TERM GOALS: Target date: 01/21/23  Pt will be able to do SLS bilaterally for 5 sec each to decrease fall risk. Baseline: Goal status: INITIAL  2.  Pt will be independent with initial HEP for stretching and strengthening.  Baseline:  Goal status: INITIAL  3.  Pt will be able to ascend/descend the curb without difficulty to allow improved mobility.  Baseline:  Goal status: INITIAL  4.  Pt will demonstrate 3/5 R hip flexor strength to decrease fall risk.  Baseline:  Goal status: INITIAL   LONG TERM GOALS: Target date: 02/18/23  Pt will demonstrate 3+/5 hip flexor strength on the R to decrease fall risk.  Baseline:  Goal status: INITIAL  2.  Pt will be able to get in and out of the car without manually lifting her R leg. Baseline:  Goal status: INITIAL  3.  Pt will be able to ascend/descend a flight of steps with out assistance or fear of falling.  Baseline:  Goal status: INITIAL  4.  Pt will demonstrate 3/5 bilateral hip extensor strength to decrease fall risk.  Baseline:  Goal status: INITIAL  5.  Pt will be independent in a home exercise program for continued stretching and strengthening.  Baseline:  Goal status: INITIAL   PLAN:  PT FREQUENCY: 2x/week  PT DURATION: 8 weeks  PLANNED INTERVENTIONS: Therapeutic exercises, Therapeutic activity, Neuromuscular re-education, Balance training, Gait training, Patient/Family education, Self Care, Joint mobilization, Stair training, and Manual therapy  PLAN FOR NEXT SESSION: BERG and add goal as needed, NuStep, LE strengthening, balance exercises   Waynette Buttery, PT 01/06/2023, 10:01 AM

## 2023-01-07 ENCOUNTER — Other Ambulatory Visit: Payer: Self-pay

## 2023-01-07 ENCOUNTER — Ambulatory Visit (INDEPENDENT_AMBULATORY_CARE_PROVIDER_SITE_OTHER): Payer: Federal, State, Local not specified - PPO | Admitting: Podiatry

## 2023-01-07 ENCOUNTER — Encounter: Payer: Self-pay | Admitting: Podiatry

## 2023-01-07 DIAGNOSIS — T451X5A Adverse effect of antineoplastic and immunosuppressive drugs, initial encounter: Secondary | ICD-10-CM

## 2023-01-07 DIAGNOSIS — G620 Drug-induced polyneuropathy: Secondary | ICD-10-CM

## 2023-01-07 DIAGNOSIS — L84 Corns and callosities: Secondary | ICD-10-CM

## 2023-01-07 DIAGNOSIS — B351 Tinea unguium: Secondary | ICD-10-CM | POA: Diagnosis not present

## 2023-01-07 DIAGNOSIS — M79675 Pain in left toe(s): Secondary | ICD-10-CM | POA: Diagnosis not present

## 2023-01-07 DIAGNOSIS — M79674 Pain in right toe(s): Secondary | ICD-10-CM

## 2023-01-07 NOTE — Progress Notes (Signed)
  Subjective:  Patient ID: Brittney Spencer, female    DOB: 1951/01/24,   MRN: [id number]  Chief Complaint  Patient presents with   Nail Problem    Routine foot care/ foot exam  Recommendations for new show wear     72 y.o. female presents for concern of thickened elongated and painful nails that are difficult to trim. Requesting to have them trimmed today. Relates burning and tingling in their feet. Patient has neuropathy from chemotherapy.   PCP:  Patient, No Pcp Per    . Denies any other pedal complaints. Denies n/v/f/c.   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 Some residual intraretinal fluid remains, very small localized subfoveal of fluid remains although this continues to slowly resorb. We'll continue to observe.   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 CFT <3 seconds. Normal hair growth on digits. No edema.  Skin. No lacerations or abrasions bilateral feet. Hyperkeratotic lesions noted plantar first and fifth metatarsal heads bilateral as well as medial eminence bilateral. Nails 1-5 are thickened discolored and elongated bilateral Musculoskeletal: MMT 5/5 bilateral lower extremities in DF, PF, Inversion and Eversion. Deceased ROM in DF of ankle joint.  Neurological: Sensation intact to light touch.    Assessment:   1. Peripheral neuropathy due to chemotherapy (HCC)   2. Pain due to onychomycosis of toenails of both feet   3. Callus of foot      Plan:  Patient was evaluated and treated and all questions answered. -Discussed and educated patient on foot care, especially with  regards to the vascular, neurological and musculoskeletal systems.  -Discussed supportive shoes at all times and checking feet regularly.  -Mechanically debrided all nails 1-5 bilateral using sterile nail nipper and filed with dremel without incident as courtesy -Hyperkeratotic tissue debrided without incident as courtesy.  -Information given on supportive shoes.  -Answered all patient questions -Patient to return  in 3 months for at risk foot care -Patient advised to call the office if any problems or questions arise in the meantime.   Louann Sjogren, DPM

## 2023-01-08 ENCOUNTER — Other Ambulatory Visit: Payer: Self-pay

## 2023-01-08 ENCOUNTER — Ambulatory Visit: Payer: Federal, State, Local not specified - PPO | Admitting: Physical Therapy

## 2023-01-08 ENCOUNTER — Encounter: Payer: Self-pay | Admitting: Physical Therapy

## 2023-01-08 DIAGNOSIS — R262 Difficulty in walking, not elsewhere classified: Secondary | ICD-10-CM

## 2023-01-08 DIAGNOSIS — C787 Secondary malignant neoplasm of liver and intrahepatic bile duct: Secondary | ICD-10-CM

## 2023-01-08 DIAGNOSIS — R209 Unspecified disturbances of skin sensation: Secondary | ICD-10-CM

## 2023-01-08 DIAGNOSIS — M6281 Muscle weakness (generalized): Secondary | ICD-10-CM | POA: Diagnosis not present

## 2023-01-08 MED FILL — Dexamethasone Sodium Phosphate Inj 100 MG/10ML: INTRAMUSCULAR | Qty: 1 | Status: AC

## 2023-01-08 NOTE — Therapy (Signed)
OUTPATIENT PHYSICAL THERAPY ONCOLOGY TREATMENT  Patient Name: Brittney Spencer MRN: [id number] DOB:[birth year], 72 y.o., female Today's Date: 01/08/2023  END OF SESSION:  PT End of Session - 01/08/23 0907     Visit Number 4    Number of Visits 17    Date for PT Re-Evaluation 02/18/23    PT Start Time 0905    PT Stop Time 0945    PT Time Calculation (min) 40 min    Activity Tolerance Patient tolerated treatment well    Behavior During Therapy [facility] for tasks assessed/performed              Past Medical History:  Diagnosis Date   AAA (abdominal aortic aneurysm) (HCC)    infrarenal 4.1 cmper s-9-19 scan on chart   Anemia    hx of   Anxiety    has PRN meds   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 Some residual intraretinal fluid remains, very small localized subfoveal of fluid remains although this continues to slowly resorb. We'll continue to observe.   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   Resolved March 2021 post vitrectomy   Past Surgical History:  Procedure Laterality Date   COLONOSCOPY  2018   HD-hams   COLONSCOPY  12/2015   IR IMAGING GUIDED PORT INSERTION  08/04/2020   LAPAROSCOPIC RIGHT HEMI COLECTOMY Right 02/07/2016   Procedure: LAPAROSCOPIC ASSISTED RIGHT HEMI COLECTOMY AND RIGHT SALPINGO OOPHERECTOMY;  Surgeon: Glenna Fellows, MD;  Location: [facility];  Service: General;  Laterality: Right;   RETINAL DETACHMENT SURGERY  09/2019   Patient Active Problem List   Diagnosis Date Noted   Peripheral neuropathy due to chemotherapy (HCC) 11/14/2022   DNR (do not  resuscitate) 04/25/2022   Encounter for antineoplastic chemotherapy 11/29/2021   Macular hole of right eye 02/20/2021   Macular pucker, right eye 02/20/2021   Port-A-Cath in place 08/10/2020   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) 07/04/2020   History of vitrectomy 03/28/2020   Vitreomacular traction, left 03/28/2020   Nuclear sclerotic cataract of left eye 03/28/2020   Follow-up examination after eye surgery 10/21/2019   Essential hypertension 03/26/2016   Iron deficiency anemia due to chronic blood loss 03/26/2016   Cancer of ascending colon (HCC) 02/07/2016   Low hemoglobin 01/05/2016   Perforated appendicitis 09/15/2015    PCP: none  REFERRING PROVIDER: [name], MD  REFERRING DIAG: C20,C78.7 (ICD-10-CM) - Rectal cancer metastasized to liver [facility])  THERAPY DIAG:  Muscle weakness (generalized)  Unspecified disturbances of skin sensation  Difficulty in walking, not elsewhere classified  Rectal cancer metastasized to liver [facility])  ONSET DATE: 11/23/22  Rationale for Evaluation and Treatment: Rehabilitation  SUBJECTIVE:  SUBJECTIVE STATEMENT: I did some of the basic exercises at home like the standing and sitting. I sometimes stand on my toes.   PERTINENT HISTORY:  Cancer of ascending colon (HCC), diagnosed in 12/2015. S/p right hemicolectomy with right salpingo oophorectomy and adjuvant chemo and radiation.Diagnosed with rectal cancer with mets to the liver in 2021. her PET from 07/12/20 indicates soft tissue mass within the right iliac fossa is noted and consistent with local tumor recurrence from previous ascending colon tumor. Restaging on 10/07/22 showed overall stable disease with multiple small lung nodules. Currently on chemo and has neuropathy and anemia  PAIN:  Are you having pain?  No not presently, PRECAUTIONS: Other: metastatic rectal cancer to liver  WEIGHT BEARING RESTRICTIONS: No  FALLS:  Has patient fallen in last 6 months? No  LIVING ENVIRONMENT: Lives with: lives alone Lives in: House/apartment Stairs: No;  Has following equipment at home: Grab bars  OCCUPATION: retired  LEISURE: pt intentionally walks  HAND DOMINANCE: right   PRIOR LEVEL OF FUNCTION: Independent  PATIENT GOALS: "to see if it offers any other suggestions as far as the body and what else I can do for the neuropathy"   OBJECTIVE:  COGNITION: Overall cognitive status: Within functional limits for tasks assessed    SENSATION: Tingling in bilateral feet and hands  POSTURE: forward head and rounded shoulders  UPPER EXTREMITY AROM/PROM: WFL  UPPER EXTREMITY STRENGTH: 5/5   LOWER EXTREMITY MMT:  MMT Right eval  Hip flexion 2+/5  Hip extension 2+/5  Hip abduction 2+/5  Hip adduction   Hip internal rotation   Hip external rotation   Knee flexion 3+/5  Knee extension 5/5  Ankle dorsiflexion 5/5  Ankle plantarflexion   Ankle inversion   Ankle eversion    (Blank rows = not tested)  MMT LEFT eval  Hip flexion 3+/5  Hip extension 2+/5  Hip abduction 3+/5  Hip adduction   Hip internal rotation   Hip external rotation   Knee flexion 4/5  Knee extension 5/5  Ankle dorsiflexion 5/5  Ankle plantarflexion   Ankle inversion   Ankle eversion    (Blank rows = not tested)     FUNCTIONAL TESTS:  30 seconds chair stand test 11 reps without use of UEs which is average for her age  44/2/24: [name] Balance Test Sit to Stand: Able to stand without using hands and stabilize independently Standing Unsupported: Able to stand safely 2 minutes Sitting with Back Unsupported but Feet Supported on Floor or Stool: Able to sit safely and securely 2 minutes Stand to Sit: Sits safely with minimal use of hands Transfers: Able to transfer safely, minor use of hands Standing  Unsupported with Eyes Closed: Able to stand 10 seconds safely Standing Ubsupported with Feet Together: Able to place feet together independently and stand 1 minute safely From Standing, Reach Forward with Outstretched Arm: Can reach forward >12 cm safely (5") From Standing Position, Pick up Object from Floor: Able to pick up shoe safely and easily From Standing Position, Turn to Look Behind Over each Shoulder: Turn sideways only but maintains balance Turn 360 Degrees: Able to turn 360 degrees safely but slowly Standing Unsupported, Alternately Place Feet on Step/Stool: Able to stand independently and safely and complete 8 steps in 20 seconds Standing Unsupported, One Foot in Front: Needs help to step but can hold 15 seconds Standing on One Leg: Tries to lift leg/unable to hold 3 seconds but remains standing independently Total Score: 45   GAIT: Distance walked:  25 feet Assistive device utilized: None Level of assistance: SBA Comments: foot flat, slightly unsteady appearing  SINGLE LIMB STANCE:  2 sec on R 3 sec on L  TODAY'S TREATMENT:                                                                                                                                         DATE:  01/08/2023 Nu step seat 7, UE 9 level 3 x 7 min 324 steps Seated hamstring stretch pt returned therapist demo 30 sec holds bilaterally x 2- more tight on R side Standing quad stretch in // bars with chair behind x 60 sec holds x 2 bilaterally with pt more tight on R side Standing march 2 x 10 intermittent hand hold Heel raises in // bars occasional HH x 15 with increased difficulty keeping weight through entire foot  Bilateral gastroc stretches 1 x 60 sec with runners stretch Step onto ax forward x 10 ea, lateral x 10 ea LAQ 3 # x 10 ea Airex beam forward x 3 laps, backwards x 3 laps intermittent HH, sidestepping on airex x 3 each with increased difficulty maintaining balance on beam Tandem stance x 3 ea with  intermittent HH Single limb stance x 3 each with HHA most of the time Instructed pt in mini squats and pt returned therapist demo x 10   01/06/2023 Nu step seat 8, UE 9 lev 2-3 x 5 min 225 steps Standing march 2 x 10 intermittent hand hold Heel raises no HH x 15 Bilateral gastroc stretches 2 x 20 sec with runners stretch Step onto ax forward x 10 ea, lateral x 10 ea LAQ 3 # x 10 ea Seated HS stretch x 3 ea Airex beam forward x 3 laps, backwards x 3 laps intermittent HH Tandem stance x 3 ea with intermittent HH  12/31/22:  Performed Berg Balance test - see above pt scored a 45/50, s he had difficulty with balance with turning quickly and reaching forward with feet planted Instructed pt in seated hamstring stretch in sitting- pt required max v/c and t/c cues for this to avoid scapular protraction, twisting, keeping knee straight and foot pulled back with 20 sec holds - pt felt tight bilaterally Instructed in quad stretch with chair behind her at the counter with pt able to hold about 10 sec with good stretch felt bilaterally Tandem stance in the corner and educated pt to do this in the kitchen facing the corner with counter on either side for support  Created HEP  12/24/22: quad stretch in standing with HHA at counter  PATIENT EDUCATION:  Education details:hamstring stretch without protraction of scapula, quad stretch with chair for leg support, tandem stance in corner Person educated: Patient Education method: Explanation Education comprehension: verbalized understanding  HOME EXERCISE PROGRAM: Sit to stands without use of UEs Access Code: [email or web address] URL: [email or web address] Date: 12/31/2022 Prepared by: Pamala Duffel  Breedlove [employer]  Exercises - Seated Hamstring Stretch  - 1 x daily - 7 x weekly - 1 sets - 3-5 reps - work up to 30 sec hold - Theatre manager with Chair and Counter Support  - 1 x daily - 7 x weekly - 1 sets - 3-5 reps - work up to 30 sec  hold - Standing  Tandem Balance with Counter Support  - 1 x daily - 7 x weekly - 1 sets - 5-10 reps - work up to 30 sec  hold  ASSESSMENT:  CLINICAL IMPRESSION: Pt continues to feel very challenged by balance exercises so continued to focus on improving balance. Added a few new stretches and exercises today to see how patient does. After last session she was a little fatigued. Encouraged pt to do her home exercises daily and explained how important consistency would be to progress.   OBJECTIVE IMPAIRMENTS: decreased activity tolerance, decreased balance, difficulty walking, decreased strength, increased fascial restrictions, impaired flexibility, impaired sensation, and postural dysfunction.   ACTIVITY LIMITATIONS: stairs and locomotion level  PARTICIPATION LIMITATIONS: cleaning, driving, and community activity  PERSONAL FACTORS: Age, Fitness, and Time since onset of injury/illness/exacerbation are also affecting patient's functional outcome.   REHAB POTENTIAL: Good  CLINICAL DECISION MAKING: Stable/uncomplicated  EVALUATION COMPLEXITY: Low  GOALS: Goals reviewed with patient? Yes  SHORT TERM GOALS: Target date: 01/21/23  Pt will be able to do SLS bilaterally for 5 sec each to decrease fall risk. Baseline: Goal status: INITIAL  2.  Pt will be independent with initial HEP for stretching and strengthening.  Baseline:  Goal status: INITIAL  3.  Pt will be able to ascend/descend the curb without difficulty to allow improved mobility.  Baseline:  Goal status: INITIAL  4.  Pt will demonstrate 3/5 R hip flexor strength to decrease fall risk.  Baseline:  Goal status: INITIAL   LONG TERM GOALS: Target date: 02/18/23  Pt will demonstrate 3+/5 hip flexor strength on the R to decrease fall risk.  Baseline:  Goal status: INITIAL  2.  Pt will be able to get in and out of the car without manually lifting her R leg. Baseline:  Goal status: INITIAL  3.  Pt will be able to ascend/descend a flight of  steps with out assistance or fear of falling.  Baseline:  Goal status: INITIAL  4.  Pt will demonstrate 3/5 bilateral hip extensor strength to decrease fall risk.  Baseline:  Goal status: INITIAL  5.  Pt will be independent in a home exercise program for continued stretching and strengthening.  Baseline:  Goal status: INITIAL   PLAN:  PT FREQUENCY: 2x/week  PT DURATION: 8 weeks  PLANNED INTERVENTIONS: Therapeutic exercises, Therapeutic activity, Neuromuscular re-education, Balance training, Gait training, Patient/Family education, Self Care, Joint mobilization, Stair training, and Manual therapy  PLAN FOR NEXT SESSION:  NuStep, LE strengthening, balance exercises   [employer], PT 01/08/2023, 9:54 AM

## 2023-01-09 ENCOUNTER — Other Ambulatory Visit: Payer: Self-pay

## 2023-01-09 ENCOUNTER — Inpatient Hospital Stay: Payer: Federal, State, Local not specified - PPO

## 2023-01-09 VITALS — BP 128/72 | HR 82 | Temp 98.2°F | Resp 18 | Wt 105.2 lb

## 2023-01-09 DIAGNOSIS — C20 Malignant neoplasm of rectum: Secondary | ICD-10-CM

## 2023-01-09 DIAGNOSIS — Z95828 Presence of other vascular implants and grafts: Secondary | ICD-10-CM

## 2023-01-09 DIAGNOSIS — Z5112 Encounter for antineoplastic immunotherapy: Secondary | ICD-10-CM | POA: Diagnosis not present

## 2023-01-09 LAB — CMP (CANCER CENTER ONLY)
ALT: 8 U/L (ref 0–44)
AST: 18 U/L (ref 15–41)
Albumin: 3.4 g/dL — ABNORMAL LOW (ref 3.5–5.0)
Alkaline Phosphatase: 80 U/L (ref 38–126)
Anion gap: 5 (ref 5–15)
BUN: 16 mg/dL (ref 8–23)
CO2: 28 mmol/L (ref 22–32)
Calcium: 8.9 mg/dL (ref 8.9–10.3)
Chloride: 104 mmol/L (ref 98–111)
Creatinine: 0.73 mg/dL (ref 0.44–1.00)
GFR, Estimated: >60 mL/min (ref >60)
Glucose, Bld: 75 mg/dL (ref 70–99)
Potassium: 4.4 mmol/L (ref 3.5–5.1)
Sodium: 137 mmol/L (ref 135–145)
Total Bilirubin: 0.4 mg/dL (ref 0.3–1.2)
Total Protein: 7.1 g/dL (ref 6.5–8.1)

## 2023-01-09 LAB — CBC WITH DIFFERENTIAL (CANCER CENTER ONLY)
Abs Immature Granulocytes: 0.02 10*3/uL (ref 0.00–0.07)
Basophils Absolute: 0 10*3/uL (ref 0.0–0.1)
Basophils Relative: 1 %
Eosinophils Absolute: 0.1 10*3/uL (ref 0.0–0.5)
Eosinophils Relative: 3 %
HCT: 32.8 % — ABNORMAL LOW (ref 36.0–46.0)
Hemoglobin: 10.4 g/dL — ABNORMAL LOW (ref 12.0–15.0)
Immature Granulocytes: 1 %
Lymphocytes Relative: 27 %
Lymphs Abs: 0.9 10*3/uL (ref 0.7–4.0)
MCH: 30 pg (ref 26.0–34.0)
MCHC: 31.7 g/dL (ref 30.0–36.0)
MCV: 94.5 fL (ref 80.0–100.0)
Monocytes Absolute: 0.5 10*3/uL (ref 0.1–1.0)
Monocytes Relative: 13 %
Neutro Abs: 1.9 10*3/uL (ref 1.7–7.7)
Neutrophils Relative %: 55 %
Platelet Count: 137 10*3/uL — ABNORMAL LOW (ref 150–400)
RBC: 3.47 MIL/uL — ABNORMAL LOW (ref 3.87–5.11)
RDW: 13.2 % (ref 11.5–15.5)
WBC Count: 3.5 10*3/uL — ABNORMAL LOW (ref 4.0–10.5)
nRBC: 0 % (ref 0.0–0.2)

## 2023-01-09 LAB — TOTAL PROTEIN, URINE DIPSTICK: Protein, ur: NEGATIVE mg/dL

## 2023-01-09 MED ORDER — DEXTROSE 5 % IV SOLN: Freq: Once | INTRAVENOUS | Status: AC

## 2023-01-09 MED ORDER — LORATADINE 10 MG PO TABS
10.0000 mg | ORAL_TABLET | Freq: Once | ORAL | Status: AC
  Administered 2023-01-09: 10 mg via ORAL
  Filled 2023-01-09: qty 1

## 2023-01-09 MED ORDER — SODIUM CHLORIDE 0.9 % IV SOLN
5.0000 mg/kg | Freq: Once | INTRAVENOUS | Status: AC
  Administered 2023-01-09: 250 mg via INTRAVENOUS
  Filled 2023-01-09: qty 10

## 2023-01-09 MED ORDER — SODIUM CHLORIDE 0.9 % IV SOLN
10.0000 mg | Freq: Once | INTRAVENOUS | Status: AC
  Administered 2023-01-09: 10 mg via INTRAVENOUS
  Filled 2023-01-09: qty 10

## 2023-01-09 MED ORDER — SODIUM CHLORIDE 0.9 % IV SOLN
1800.0000 mg/m2 | INTRAVENOUS | Status: DC
  Administered 2023-01-09: 2500 mg via INTRAVENOUS
  Filled 2023-01-09: qty 50

## 2023-01-09 MED ORDER — PALONOSETRON HCL INJECTION 0.25 MG/5ML
0.2500 mg | Freq: Once | INTRAVENOUS | Status: AC
  Administered 2023-01-09: 0.25 mg via INTRAVENOUS
  Filled 2023-01-09: qty 5

## 2023-01-09 MED ORDER — SODIUM CHLORIDE 0.9% FLUSH
10.0000 mL | Freq: Once | INTRAVENOUS | Status: AC | PRN
  Administered 2023-01-09: 10 mL

## 2023-01-09 MED ORDER — SODIUM CHLORIDE 0.9 % IV SOLN: Freq: Once | INTRAVENOUS | Status: AC

## 2023-01-09 MED ORDER — OXALIPLATIN CHEMO INJECTION 100 MG/20ML
50.0000 mg/m2 | Freq: Once | INTRAVENOUS | Status: AC
  Administered 2023-01-09: 75 mg via INTRAVENOUS
  Filled 2023-01-09: qty 15

## 2023-01-09 MED ORDER — FAMOTIDINE IN NACL 20-0.9 MG/50ML-% IV SOLN
20.0000 mg | Freq: Once | INTRAVENOUS | Status: AC
  Administered 2023-01-09: 20 mg via INTRAVENOUS
  Filled 2023-01-09: qty 50

## 2023-01-09 MED ORDER — LEUCOVORIN CALCIUM INJECTION 350 MG
400.0000 mg/m2 | Freq: Once | INTRAVENOUS | Status: AC
  Administered 2023-01-09: 600 mg via INTRAVENOUS
  Filled 2023-01-09: qty 30

## 2023-01-09 NOTE — Patient Instructions (Signed)
[facility]  Discharge Instructions: Thank you for choosing [facility] to provide your oncology and hematology care.   If you have a lab appointment with the Cancer Center, please go directly to the Cancer Center and check in at the registration area.   Wear comfortable clothing and clothing appropriate for easy access to any Portacath or PICC line.   We strive to give you quality time with your provider. You may need to reschedule your appointment if you arrive late (15 or more minutes).  Arriving late affects you and other patients whose appointments are after yours.  Also, if you miss three or more appointments without notifying the office, you may be dismissed from the clinic at the provider's discretion.      For prescription refill requests, have your pharmacy contact our office and allow 72 hours for refills to be completed.    Today you received the following chemotherapy and/or immunotherapy agents: Zirabev, Leucovorin/Fluorouracil      To help prevent nausea and vomiting after your treatment, we encourage you to take your nausea medication as directed.  BELOW ARE SYMPTOMS THAT SHOULD BE REPORTED IMMEDIATELY: *FEVER GREATER THAN 100.4 F (38 C) OR HIGHER *CHILLS OR SWEATING *NAUSEA AND VOMITING THAT IS NOT CONTROLLED WITH YOUR NAUSEA MEDICATION *UNUSUAL SHORTNESS OF BREATH *UNUSUAL BRUISING OR BLEEDING *URINARY PROBLEMS (pain or burning when urinating, or frequent urination) *BOWEL PROBLEMS (unusual diarrhea, constipation, pain near the anus) TENDERNESS IN MOUTH AND THROAT WITH OR WITHOUT PRESENCE OF ULCERS (sore throat, sores in mouth, or a toothache) UNUSUAL RASH, SWELLING OR PAIN  UNUSUAL VAGINAL DISCHARGE OR ITCHING   Items with * indicate a potential emergency and should be followed up as soon as possible or go to the Emergency Department if any problems should occur.  Please show the CHEMOTHERAPY ALERT CARD or  IMMUNOTHERAPY ALERT CARD at check-in to the Emergency Department and triage nurse.  Should you have questions after your visit or need to cancel or reschedule your appointment, please contact [facility]  Dept: [phone]  and follow the prompts.  Office hours are 8:00 a.m. to 4:30 p.m. Monday - Friday. Please note that voicemails left after 4:00 p.m. may not be returned until the following business day.  We are closed weekends and major holidays. You have access to a nurse at all times for urgent questions. Please call the main number to the clinic Dept: [phone] and follow the prompts.   For any non-urgent questions, you may also contact your provider using MyChart. We now offer e-Visits for anyone 18 and older to request care online for non-urgent symptoms. For details visit [email or web address].   Also download the MyChart app! Go to the app store, search "MyChart", open the app, select [facility], and log in with your MyChart username and password.  

## 2023-01-11 ENCOUNTER — Inpatient Hospital Stay: Payer: Federal, State, Local not specified - PPO

## 2023-01-11 VITALS — BP 120/73 | HR 83 | Temp 99.0°F

## 2023-01-11 DIAGNOSIS — Z5112 Encounter for antineoplastic immunotherapy: Secondary | ICD-10-CM | POA: Diagnosis not present

## 2023-01-11 DIAGNOSIS — C20 Malignant neoplasm of rectum: Secondary | ICD-10-CM

## 2023-01-11 MED ORDER — PEGFILGRASTIM-JMDB 6 MG/0.6ML ~~LOC~~ SOSY
6.0000 mg | PREFILLED_SYRINGE | Freq: Once | SUBCUTANEOUS | Status: AC
  Administered 2023-01-11: 6 mg via SUBCUTANEOUS

## 2023-01-11 MED ORDER — SODIUM CHLORIDE 0.9% FLUSH
10.0000 mL | INTRAVENOUS | Status: DC | PRN
  Administered 2023-01-11: 10 mL

## 2023-01-11 MED ORDER — HEPARIN SOD (PORK) LOCK FLUSH 100 UNIT/ML IV SOLN
500.0000 [IU] | Freq: Once | INTRAVENOUS | Status: AC | PRN
  Administered 2023-01-11: 500 [IU]

## 2023-01-13 ENCOUNTER — Ambulatory Visit: Payer: Federal, State, Local not specified - PPO | Admitting: Physical Therapy

## 2023-01-13 ENCOUNTER — Encounter: Payer: Self-pay | Admitting: Physical Therapy

## 2023-01-13 DIAGNOSIS — M6281 Muscle weakness (generalized): Secondary | ICD-10-CM

## 2023-01-13 DIAGNOSIS — R262 Difficulty in walking, not elsewhere classified: Secondary | ICD-10-CM

## 2023-01-13 DIAGNOSIS — R209 Unspecified disturbances of skin sensation: Secondary | ICD-10-CM

## 2023-01-13 DIAGNOSIS — C20 Malignant neoplasm of rectum: Secondary | ICD-10-CM

## 2023-01-13 NOTE — Therapy (Signed)
OUTPATIENT PHYSICAL THERAPY ONCOLOGY TREATMENT  Patient Name: Brittney Spencer MRN: [id number] DOB:1951-01-09, 72 y.o., female Today's Date: 01/13/2023  END OF SESSION:  PT End of Session - 01/13/23 1056     Visit Number 5    Number of Visits 17    Date for PT Re-Evaluation 02/18/23    PT Start Time 1005    PT Stop Time 1057    PT Time Calculation (min) 52 min    Activity Tolerance Patient tolerated treatment well    Behavior During Therapy [facility] for tasks assessed/performed               Past Medical History:  Diagnosis Date   AAA (abdominal aortic aneurysm) (HCC)    infrarenal 4.1 cmper s-9-19 scan on chart   Anemia    hx of   Anxiety    has PRN meds   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 Some residual intraretinal fluid remains, very small localized subfoveal of fluid remains although this continues to slowly resorb. We'll continue to observe.   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   Resolved March 2021 post vitrectomy   Past Surgical History:  Procedure Laterality Date   COLONOSCOPY  2018   HD-hams   COLONSCOPY  12/2015   IR IMAGING GUIDED PORT INSERTION  08/04/2020   LAPAROSCOPIC RIGHT HEMI COLECTOMY Right 02/07/2016   Procedure: LAPAROSCOPIC ASSISTED RIGHT HEMI COLECTOMY AND RIGHT SALPINGO OOPHERECTOMY;  Surgeon: Glenna Fellows, MD;  Location: [facility];  Service: General;  Laterality: Right;   RETINAL DETACHMENT SURGERY  09/2019   Patient Active Problem List   Diagnosis Date Noted   Peripheral neuropathy due to chemotherapy (HCC) 11/14/2022   DNR (do not  resuscitate) 04/25/2022   Encounter for antineoplastic chemotherapy 11/29/2021   Macular hole of right eye 02/20/2021   Macular pucker, right eye 02/20/2021   Port-A-Cath in place 08/10/2020   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) 07/04/2020   History of vitrectomy 03/28/2020   Vitreomacular traction, left 03/28/2020   Nuclear sclerotic cataract of left eye 03/28/2020   Follow-up examination after eye surgery 10/21/2019   Essential hypertension 03/26/2016   Iron deficiency anemia due to chronic blood loss 03/26/2016   Cancer of ascending colon (HCC) 02/07/2016   Low hemoglobin 01/05/2016   Perforated appendicitis 09/15/2015    PCP: none  REFERRING PROVIDER: [name], MD  REFERRING DIAG: C20,C78.7 (ICD-10-CM) - Rectal cancer metastasized to liver [facility])  THERAPY DIAG:  Muscle weakness (generalized)  Unspecified disturbances of skin sensation  Difficulty in walking, not elsewhere classified  Rectal cancer metastasized to liver [facility])  ONSET DATE: 11/23/22  Rationale for Evaluation and Treatment: Rehabilitation  SUBJECTIVE:  SUBJECTIVE STATEMENT: I am sore today because I had treatment and then I just had my shot on Saturday. I was a little more tired after last session but I had a little bit of pain in the knee area.   PERTINENT HISTORY:  Cancer of ascending colon (HCC), diagnosed in 12/2015. S/p right hemicolectomy with right salpingo oophorectomy and adjuvant chemo and radiation.Diagnosed with rectal cancer with mets to the liver in 2021. her PET from 07/12/20 indicates soft tissue mass within the right iliac fossa is noted and consistent with local tumor recurrence from previous ascending colon tumor. Restaging on 10/07/22 showed overall stable disease with multiple small lung nodules. Currently  on chemo and has neuropathy and anemia  PAIN:  Are you having pain? No not presently  PRECAUTIONS: Other: metastatic rectal cancer to liver  WEIGHT BEARING RESTRICTIONS: No  FALLS:  Has patient fallen in last 6 months? No  LIVING ENVIRONMENT: Lives with: lives alone Lives in: House/apartment Stairs: No;  Has following equipment at home: Grab bars  OCCUPATION: retired  LEISURE: pt intentionally walks  HAND DOMINANCE: right   PRIOR LEVEL OF FUNCTION: Independent  PATIENT GOALS: "to see if it offers any other suggestions as far as the body and what else I can do for the neuropathy"   OBJECTIVE:  COGNITION: Overall cognitive status: Within functional limits for tasks assessed    SENSATION: Tingling in bilateral feet and hands  POSTURE: forward head and rounded shoulders  UPPER EXTREMITY AROM/PROM: WFL  UPPER EXTREMITY STRENGTH: 5/5   LOWER EXTREMITY MMT:  MMT Right eval  Hip flexion 2+/5  Hip extension 2+/5  Hip abduction 2+/5  Hip adduction   Hip internal rotation   Hip external rotation   Knee flexion 3+/5  Knee extension 5/5  Ankle dorsiflexion 5/5  Ankle plantarflexion   Ankle inversion   Ankle eversion    (Blank rows = not tested)  MMT LEFT eval  Hip flexion 3+/5  Hip extension 2+/5  Hip abduction 3+/5  Hip adduction   Hip internal rotation   Hip external rotation   Knee flexion 4/5  Knee extension 5/5  Ankle dorsiflexion 5/5  Ankle plantarflexion   Ankle inversion   Ankle eversion    (Blank rows = not tested)     FUNCTIONAL TESTS:  30 seconds chair stand test 11 reps without use of UEs which is average for her age  65/2/24: [name] Balance Test Sit to Stand: Able to stand without using hands and stabilize independently Standing Unsupported: Able to stand safely 2 minutes Sitting with Back Unsupported but Feet Supported on Floor or Stool: Able to sit safely and securely 2 minutes Stand to Sit: Sits safely with minimal use of  hands Transfers: Able to transfer safely, minor use of hands Standing Unsupported with Eyes Closed: Able to stand 10 seconds safely Standing Ubsupported with Feet Together: Able to place feet together independently and stand 1 minute safely From Standing, Reach Forward with Outstretched Arm: Can reach forward >12 cm safely (5") From Standing Position, Pick up Object from Floor: Able to pick up shoe safely and easily From Standing Position, Turn to Look Behind Over each Shoulder: Turn sideways only but maintains balance Turn 360 Degrees: Able to turn 360 degrees safely but slowly Standing Unsupported, Alternately Place Feet on Step/Stool: Able to stand independently and safely and complete 8 steps in 20 seconds Standing Unsupported, One Foot in Front: Needs help to step but can hold 15 seconds Standing on One Leg: Tries  to lift leg/unable to hold 3 seconds but remains standing independently Total Score: 45   GAIT: Distance walked: 25 feet Assistive device utilized: None Level of assistance: SBA Comments: foot flat, slightly unsteady appearing  SINGLE LIMB STANCE:  2 sec on R 3 sec on L  TODAY'S TREATMENT:                                                                                                                                         DATE:  01/13/2023 Nu step seat 8, UE 9 level 2 x 7 min 330 steps Seated hamstring stretch pt returned therapist demo 30 sec holds bilaterally x 2- more tight on R side Piriformis stretch x 30 sec holds bilaterally Supine on mat being mindful not to put any weight on port - therapist placing gentle pressure over hip to extend hip with knee bent to 90 and therapist pulling lower leg back for hip flexor stretch x 3 with 1 min holds SLR x 10 reps bilaterally with increased difficulty on R side Bridging with pt returning therapist demo x 10 with v/c to keep core engaged Gentle MFR to R lower abdominal area where pt has discomfort - gently to mobilize skin  and fascia  01/08/2023 Nu step seat 7, UE 9 level 3 x 7 min 324 steps Seated hamstring stretch pt returned therapist demo 30 sec holds bilaterally x 2- more tight on R side Standing quad stretch in // bars with chair behind x 60 sec holds x 2 bilaterally with pt more tight on R side Standing march 2 x 10 intermittent hand hold Heel raises in // bars occasional HH x 15 with increased difficulty keeping weight through entire foot  Bilateral gastroc stretches 1 x 60 sec with runners stretch Step onto ax forward x 10 ea, lateral x 10 ea LAQ 3 # x 10 ea Airex beam forward x 3 laps, backwards x 3 laps intermittent HH, sidestepping on airex x 3 each with increased difficulty maintaining balance on beam Tandem stance x 3 ea with intermittent HH Single limb stance x 3 each with HHA most of the time Instructed pt in mini squats and pt returned therapist demo x 10   01/06/2023 Nu step seat 8, UE 9 lev 2-3 x 5 min 225 steps Standing march 2 x 10 intermittent hand hold Heel raises no HH x 15 Bilateral gastroc stretches 2 x 20 sec with runners stretch Step onto ax forward x 10 ea, lateral x 10 ea LAQ 3 # x 10 ea Seated HS stretch x 3 ea Airex beam forward x 3 laps, backwards x 3 laps intermittent HH Tandem stance x 3 ea with intermittent HH  12/31/22:  Performed Berg Balance test - see above pt scored a 45/50, s he had difficulty with balance with turning quickly and reaching forward with feet planted Instructed pt in seated hamstring stretch in  sitting- pt required max v/c and t/c cues for this to avoid scapular protraction, twisting, keeping knee straight and foot pulled back with 20 sec holds - pt felt tight bilaterally Instructed in quad stretch with chair behind her at the counter with pt able to hold about 10 sec with good stretch felt bilaterally Tandem stance in the corner and educated pt to do this in the kitchen facing the corner with counter on either side for support  Created  HEP  12/24/22: quad stretch in standing with HHA at counter  PATIENT EDUCATION:  Education details:hamstring stretch without protraction of scapula, quad stretch with chair for leg support, tandem stance in corner Person educated: Patient Education method: Explanation Education comprehension: verbalized understanding  HOME EXERCISE PROGRAM: Sit to stands without use of UEs Access Code: [email or web address] URL: [email or web address] Date: 12/31/2022 Prepared by: Leonette Most  Exercises - Seated Hamstring Stretch  - 1 x daily - 7 x weekly - 1 sets - 3-5 reps - work up to 30 sec hold - Theatre manager with Chair and Counter Support  - 1 x daily - 7 x weekly - 1 sets - 3-5 reps - work up to 30 sec  hold - Standing Tandem Balance with Counter Support  - 1 x daily - 7 x weekly - 1 sets - 5-10 reps - work up to 30 sec  hold  ASSESSMENT:  CLINICAL IMPRESSION: Pt demonstrates increased tightness in R hip flexors with increased weakness. She has increased difficulty with seated marches due to weakness and tightness in the R hip area. Added gentle MFR to R lower abdominal area with pt demonstrating increased tissue mobility by end of session and pt could feel a difference as well. Encouraged pt to try this at home. Continued with strengthening exercises today especially for R hip flexors and extensors.   OBJECTIVE IMPAIRMENTS: decreased activity tolerance, decreased balance, difficulty walking, decreased strength, increased fascial restrictions, impaired flexibility, impaired sensation, and postural dysfunction.   ACTIVITY LIMITATIONS: stairs and locomotion level  PARTICIPATION LIMITATIONS: cleaning, driving, and community activity  PERSONAL FACTORS: Age, Fitness, and Time since onset of injury/illness/exacerbation are also affecting patient's functional outcome.   REHAB POTENTIAL: Good  CLINICAL DECISION MAKING: Stable/uncomplicated  EVALUATION COMPLEXITY:  Low  GOALS: Goals reviewed with patient? Yes  SHORT TERM GOALS: Target date: 01/21/23  Pt will be able to do SLS bilaterally for 5 sec each to decrease fall risk. Baseline: Goal status: INITIAL  2.  Pt will be independent with initial HEP for stretching and strengthening.  Baseline:  Goal status: INITIAL  3.  Pt will be able to ascend/descend the curb without difficulty to allow improved mobility.  Baseline:  Goal status: INITIAL  4.  Pt will demonstrate 3/5 R hip flexor strength to decrease fall risk.  Baseline:  Goal status: INITIAL   LONG TERM GOALS: Target date: 02/18/23  Pt will demonstrate 3+/5 hip flexor strength on the R to decrease fall risk.  Baseline:  Goal status: INITIAL  2.  Pt will be able to get in and out of the car without manually lifting her R leg. Baseline:  Goal status: INITIAL  3.  Pt will be able to ascend/descend a flight of steps with out assistance or fear of falling.  Baseline:  Goal status: INITIAL  4.  Pt will demonstrate 3/5 bilateral hip extensor strength to decrease fall risk.  Baseline:  Goal status: INITIAL  5.  Pt will be independent in a home exercise program for  continued stretching and strengthening.  Baseline:  Goal status: INITIAL   PLAN:  PT FREQUENCY: 2x/week  PT DURATION: 8 weeks  PLANNED INTERVENTIONS: Therapeutic exercises, Therapeutic activity, Neuromuscular re-education, Balance training, Gait training, Patient/Family education, Self Care, Joint mobilization, Stair training, and Manual therapy  PLAN FOR NEXT SESSION:  NuStep, LE strengthening, balance exercises   [employer], PT 01/13/2023, 11:02 AM

## 2023-01-15 ENCOUNTER — Ambulatory Visit: Payer: Federal, State, Local not specified - PPO | Admitting: Physical Therapy

## 2023-01-15 ENCOUNTER — Encounter: Payer: Self-pay | Admitting: Physical Therapy

## 2023-01-15 DIAGNOSIS — M6281 Muscle weakness (generalized): Secondary | ICD-10-CM | POA: Diagnosis not present

## 2023-01-15 DIAGNOSIS — R209 Unspecified disturbances of skin sensation: Secondary | ICD-10-CM

## 2023-01-15 DIAGNOSIS — C20 Malignant neoplasm of rectum: Secondary | ICD-10-CM

## 2023-01-15 DIAGNOSIS — R262 Difficulty in walking, not elsewhere classified: Secondary | ICD-10-CM

## 2023-01-15 NOTE — Therapy (Signed)
OUTPATIENT PHYSICAL THERAPY ONCOLOGY TREATMENT  Patient Name: Brittney Spencer MRN: [id number] DOB:[birth year], 72 y.o., female Today's Date: 01/15/2023  END OF SESSION:  PT End of Session - 01/15/23 0951     Visit Number 6    Number of Visits 17    Date for PT Re-Evaluation 02/18/23    PT Start Time 0905    PT Stop Time 0952    PT Time Calculation (min) 47 min    Activity Tolerance Patient tolerated treatment well    Behavior During Therapy [facility] for tasks assessed/performed                Past Medical History:  Diagnosis Date   AAA (abdominal aortic aneurysm) (HCC)    infrarenal 4.1 cmper s-9-19 scan on chart   Anemia    hx of   Anxiety    has PRN meds   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 Some residual intraretinal fluid remains, very small localized subfoveal of fluid remains although this continues to slowly resorb. We'll continue to observe.   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   Resolved March 2021 post vitrectomy   Past Surgical History:  Procedure Laterality Date   COLONOSCOPY  2018   HD-hams   COLONSCOPY  12/2015   IR IMAGING GUIDED PORT INSERTION  08/04/2020   LAPAROSCOPIC RIGHT HEMI COLECTOMY Right 02/07/2016   Procedure: LAPAROSCOPIC ASSISTED RIGHT HEMI COLECTOMY AND RIGHT SALPINGO OOPHERECTOMY;  Surgeon: Glenna Fellows, MD;  Location: [facility];  Service: General;  Laterality: Right;   RETINAL DETACHMENT SURGERY  09/2019   Patient Active Problem List   Diagnosis Date Noted   Peripheral neuropathy due to chemotherapy (HCC) 11/14/2022   DNR (do not  resuscitate) 04/25/2022   Encounter for antineoplastic chemotherapy 11/29/2021   Macular hole of right eye 02/20/2021   Macular pucker, right eye 02/20/2021   Port-A-Cath in place 08/10/2020   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) 07/04/2020   History of vitrectomy 03/28/2020   Vitreomacular traction, left 03/28/2020   Nuclear sclerotic cataract of left eye 03/28/2020   Follow-up examination after eye surgery 10/21/2019   Essential hypertension 03/26/2016   Iron deficiency anemia due to chronic blood loss 03/26/2016   Cancer of ascending colon (HCC) 02/07/2016   Low hemoglobin 01/05/2016   Perforated appendicitis 09/15/2015    PCP: none  REFERRING PROVIDER: [name], MD  REFERRING DIAG: C20,C78.7 (ICD-10-CM) - Rectal cancer metastasized to liver [facility])  THERAPY DIAG:  Muscle weakness (generalized)  Unspecified disturbances of skin sensation  Difficulty in walking, not elsewhere classified  Rectal cancer metastasized to liver [facility])  ONSET DATE: 11/23/22  Rationale for Evaluation and Treatment: Rehabilitation  SUBJECTIVE:  SUBJECTIVE STATEMENT: I felt like I had improved motion this morning.    PERTINENT HISTORY:  Cancer of ascending colon (HCC), diagnosed in 12/2015. S/p right hemicolectomy with right salpingo oophorectomy and adjuvant chemo and radiation.Diagnosed with rectal cancer with mets to the liver in 2021. her PET from 07/12/20 indicates soft tissue mass within the right iliac fossa is noted and consistent with local tumor recurrence from previous ascending colon tumor. Restaging on 10/07/22 showed overall stable disease with multiple small lung nodules. Currently on chemo and has neuropathy and anemia  PAIN:  Are you having pain? No not presently  PRECAUTIONS: Other: metastatic  rectal cancer to liver  WEIGHT BEARING RESTRICTIONS: No  FALLS:  Has patient fallen in last 6 months? No  LIVING ENVIRONMENT: Lives with: lives alone Lives in: House/apartment Stairs: No;  Has following equipment at home: Grab bars  OCCUPATION: retired  LEISURE: pt intentionally walks  HAND DOMINANCE: right   PRIOR LEVEL OF FUNCTION: Independent  PATIENT GOALS: "to see if it offers any other suggestions as far as the body and what else I can do for the neuropathy"   OBJECTIVE:  COGNITION: Overall cognitive status: Within functional limits for tasks assessed    SENSATION: Tingling in bilateral feet and hands  POSTURE: forward head and rounded shoulders  UPPER EXTREMITY AROM/PROM: WFL  UPPER EXTREMITY STRENGTH: 5/5   LOWER EXTREMITY MMT:  MMT Right eval  Hip flexion 2+/5  Hip extension 2+/5  Hip abduction 2+/5  Hip adduction   Hip internal rotation   Hip external rotation   Knee flexion 3+/5  Knee extension 5/5  Ankle dorsiflexion 5/5  Ankle plantarflexion   Ankle inversion   Ankle eversion    (Blank rows = not tested)  MMT LEFT eval  Hip flexion 3+/5  Hip extension 2+/5  Hip abduction 3+/5  Hip adduction   Hip internal rotation   Hip external rotation   Knee flexion 4/5  Knee extension 5/5  Ankle dorsiflexion 5/5  Ankle plantarflexion   Ankle inversion   Ankle eversion    (Blank rows = not tested)     FUNCTIONAL TESTS:  30 seconds chair stand test 11 reps without use of UEs which is average for her age  46/2/24: [name] Balance Test Sit to Stand: Able to stand without using hands and stabilize independently Standing Unsupported: Able to stand safely 2 minutes Sitting with Back Unsupported but Feet Supported on Floor or Stool: Able to sit safely and securely 2 minutes Stand to Sit: Sits safely with minimal use of hands Transfers: Able to transfer safely, minor use of hands Standing Unsupported with Eyes Closed: Able to stand 10 seconds  safely Standing Ubsupported with Feet Together: Able to place feet together independently and stand 1 minute safely From Standing, Reach Forward with Outstretched Arm: Can reach forward >12 cm safely (5") From Standing Position, Pick up Object from Floor: Able to pick up shoe safely and easily From Standing Position, Turn to Look Behind Over each Shoulder: Turn sideways only but maintains balance Turn 360 Degrees: Able to turn 360 degrees safely but slowly Standing Unsupported, Alternately Place Feet on Step/Stool: Able to stand independently and safely and complete 8 steps in 20 seconds Standing Unsupported, One Foot in Front: Needs help to step but can hold 15 seconds Standing on One Leg: Tries to lift leg/unable to hold 3 seconds but remains standing independently Total Score: 45   GAIT: Distance walked: 25 feet Assistive device utilized: None Level of assistance:  SBA Comments: foot flat, slightly unsteady appearing  SINGLE LIMB STANCE:  2 sec on R 3 sec on L  TODAY'S TREATMENT:                                                                                                                                         DATE:  01/15/2023 Nu step seat 8, UE 9 level 3 x 8 min 472 steps Seated hamstring stretch pt returned therapist demo 30 sec holds bilaterally x 2- more tight on R side Piriformis stretch x 30 sec holds bilaterally SLR x 10 reps bilaterally with v/c to engage core and to coordinate it with diaphragmatic breathing Bridging with pt returning therapist demo x 10 with v/c to keep core engaged and to coordinate with diaphragmatic breathing Gentle suction cupping with silicone cups to gently  mobilize skin and fascia - initially with no cocoa butter and then added cocoa butter to be able to move cups  01/13/2023 Nu step seat 8, UE 9 level 2 x 7 min 330 steps Seated hamstring stretch pt returned therapist demo 30 sec holds bilaterally x 2- more tight on R side Piriformis stretch x  30 sec holds bilaterally Supine on mat being mindful not to put any weight on port - therapist placing gentle pressure over hip to extend hip with knee bent to 90 and therapist pulling lower leg back for hip flexor stretch x 3 with 1 min holds SLR x 10 reps bilaterally with increased difficulty on R side Bridging with pt returning therapist demo x 10 with v/c to keep core engaged Gentle MFR to R lower abdominal area where pt has discomfort - gently to mobilize skin and fascia  01/08/2023 Nu step seat 7, UE 9 level 3 x 7 min 324 steps Seated hamstring stretch pt returned therapist demo 30 sec holds bilaterally x 2- more tight on R side Standing quad stretch in // bars with chair behind x 60 sec holds x 2 bilaterally with pt more tight on R side Standing march 2 x 10 intermittent hand hold Heel raises in // bars occasional HH x 15 with increased difficulty keeping weight through entire foot  Bilateral gastroc stretches 1 x 60 sec with runners stretch Step onto ax forward x 10 ea, lateral x 10 ea LAQ 3 # x 10 ea Airex beam forward x 3 laps, backwards x 3 laps intermittent HH, sidestepping on airex x 3 each with increased difficulty maintaining balance on beam Tandem stance x 3 ea with intermittent HH Single limb stance x 3 each with HHA most of the time Instructed pt in mini squats and pt returned therapist demo x 10   01/06/2023 Nu step seat 8, UE 9 lev 2-3 x 5 min 225 steps Standing march 2 x 10 intermittent hand hold Heel raises no HH x 15 Bilateral gastroc stretches 2 x 20 sec with runners stretch  Step onto ax forward x 10 ea, lateral x 10 ea LAQ 3 # x 10 ea Seated HS stretch x 3 ea Airex beam forward x 3 laps, backwards x 3 laps intermittent HH Tandem stance x 3 ea with intermittent HH  12/31/22:  Performed Berg Balance test - see above pt scored a 45/50, s he had difficulty with balance with turning quickly and reaching forward with feet planted Instructed pt in seated hamstring  stretch in sitting- pt required max v/c and t/c cues for this to avoid scapular protraction, twisting, keeping knee straight and foot pulled back with 20 sec holds - pt felt tight bilaterally Instructed in quad stretch with chair behind her at the counter with pt able to hold about 10 sec with good stretch felt bilaterally Tandem stance in the corner and educated pt to do this in the kitchen facing the corner with counter on either side for support  Created HEP  12/24/22: quad stretch in standing with HHA at counter  PATIENT EDUCATION:  Education details:hamstring stretch without protraction of scapula, quad stretch with chair for leg support, tandem stance in corner Person educated: Patient Education method: Explanation Education comprehension: verbalized understanding  HOME EXERCISE PROGRAM: Sit to stands without use of UEs Access Code: [email or web address] URL: [email or web address] Date: 12/31/2022 Prepared by: Leonette Most  Exercises - Seated Hamstring Stretch  - 1 x daily - 7 x weekly - 1 sets - 3-5 reps - work up to 30 sec hold - Theatre manager with Chair and Counter Support  - 1 x daily - 7 x weekly - 1 sets - 3-5 reps - work up to 30 sec  hold - Standing Tandem Balance with Counter Support  - 1 x daily - 7 x weekly - 1 sets - 5-10 reps - work up to 30 sec  hold  ASSESSMENT:  CLINICAL IMPRESSION: Continued with strengthening today. Pt was able to tolerate increased resistance and time on the NuStep today. Began instructing pt in diaphragmatic breathing and how to coordinate that with exercises and core contraction. Pt needs moderate cueing for this. Added gentle suction cupping to R lower abdominal area to help decrease tightness.   OBJECTIVE IMPAIRMENTS: decreased activity tolerance, decreased balance, difficulty walking, decreased strength, increased fascial restrictions, impaired flexibility, impaired sensation, and postural dysfunction.   ACTIVITY LIMITATIONS:  stairs and locomotion level  PARTICIPATION LIMITATIONS: cleaning, driving, and community activity  PERSONAL FACTORS: Age, Fitness, and Time since onset of injury/illness/exacerbation are also affecting patient's functional outcome.   REHAB POTENTIAL: Good  CLINICAL DECISION MAKING: Stable/uncomplicated  EVALUATION COMPLEXITY: Low  GOALS: Goals reviewed with patient? Yes  SHORT TERM GOALS: Target date: 01/21/23  Pt will be able to do SLS bilaterally for 5 sec each to decrease fall risk. Baseline: Goal status: INITIAL  2.  Pt will be independent with initial HEP for stretching and strengthening.  Baseline:  Goal status: INITIAL  3.  Pt will be able to ascend/descend the curb without difficulty to allow improved mobility.  Baseline:  Goal status: INITIAL  4.  Pt will demonstrate 3/5 R hip flexor strength to decrease fall risk.  Baseline:  Goal status: INITIAL   LONG TERM GOALS: Target date: 02/18/23  Pt will demonstrate 3+/5 hip flexor strength on the R to decrease fall risk.  Baseline:  Goal status: INITIAL  2.  Pt will be able to get in and out of the car without manually lifting her R leg. Baseline:  Goal status: INITIAL  3.  Pt will be able to ascend/descend a flight of steps with out assistance or fear of falling.  Baseline:  Goal status: INITIAL  4.  Pt will demonstrate 3/5 bilateral hip extensor strength to decrease fall risk.  Baseline:  Goal status: INITIAL  5.  Pt will be independent in a home exercise program for continued stretching and strengthening.  Baseline:  Goal status: INITIAL   PLAN:  PT FREQUENCY: 2x/week  PT DURATION: 8 weeks  PLANNED INTERVENTIONS: Therapeutic exercises, Therapeutic activity, Neuromuscular re-education, Balance training, Gait training, Patient/Family education, Self Care, Joint mobilization, Stair training, and Manual therapy  PLAN FOR NEXT SESSION:  NuStep, LE strengthening, balance exercises   [employer], PT 01/15/2023, 10:00 AM

## 2023-01-20 ENCOUNTER — Ambulatory Visit: Payer: Federal, State, Local not specified - PPO

## 2023-01-20 DIAGNOSIS — R262 Difficulty in walking, not elsewhere classified: Secondary | ICD-10-CM

## 2023-01-20 DIAGNOSIS — R209 Unspecified disturbances of skin sensation: Secondary | ICD-10-CM

## 2023-01-20 DIAGNOSIS — M6281 Muscle weakness (generalized): Secondary | ICD-10-CM | POA: Diagnosis not present

## 2023-01-20 DIAGNOSIS — C20 Malignant neoplasm of rectum: Secondary | ICD-10-CM

## 2023-01-20 NOTE — Therapy (Signed)
OUTPATIENT PHYSICAL THERAPY ONCOLOGY TREATMENT  Patient Name: Brittney Spencer MRN: [id number] DOB:22-Oct-1950, 72 y.o., female Today's Date: 01/20/2023  END OF SESSION:  PT End of Session - 01/20/23 0758     Visit Number 7    Number of Visits 17    Date for PT Re-Evaluation 02/18/23    PT Start Time 0800    PT Stop Time 0856    PT Time Calculation (min) 56 min    Activity Tolerance Patient tolerated treatment well    Behavior During Therapy [facility] for tasks assessed/performed                Past Medical History:  Diagnosis Date   AAA (abdominal aortic aneurysm) (HCC)    infrarenal 4.1 cmper s-9-19 scan on chart   Anemia    hx of   Anxiety    has PRN meds   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 Some residual intraretinal fluid remains, very small localized subfoveal of fluid remains although this continues to slowly resorb. We'll continue to observe.   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   Resolved March 2021 post vitrectomy   Past Surgical History:  Procedure Laterality Date   COLONOSCOPY  2018   HD-hams   COLONSCOPY  12/2015   IR IMAGING GUIDED PORT INSERTION  08/04/2020   LAPAROSCOPIC RIGHT HEMI COLECTOMY Right 02/07/2016   Procedure: LAPAROSCOPIC ASSISTED RIGHT HEMI COLECTOMY AND RIGHT SALPINGO OOPHERECTOMY;  Surgeon: Glenna Fellows, MD;  Location: [facility];  Service: General;  Laterality: Right;   RETINAL DETACHMENT SURGERY  09/2019   Patient Active Problem List   Diagnosis Date Noted   Peripheral neuropathy due to chemotherapy (HCC) 11/14/2022   DNR (do not  resuscitate) 04/25/2022   Encounter for antineoplastic chemotherapy 11/29/2021   Macular hole of right eye 02/20/2021   Macular pucker, right eye 02/20/2021   Port-A-Cath in place 08/10/2020   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) 07/04/2020   History of vitrectomy 03/28/2020   Vitreomacular traction, left 03/28/2020   Nuclear sclerotic cataract of left eye 03/28/2020   Follow-up examination after eye surgery 10/21/2019   Essential hypertension 03/26/2016   Iron deficiency anemia due to chronic blood loss 03/26/2016   Cancer of ascending colon (HCC) 02/07/2016   Low hemoglobin 01/05/2016   Perforated appendicitis 09/15/2015    PCP: none  REFERRING PROVIDER: [name], MD  REFERRING DIAG: C20,C78.7 (ICD-10-CM) - Rectal cancer metastasized to liver [facility])  THERAPY DIAG:  Muscle weakness (generalized)  Unspecified disturbances of skin sensation  Difficulty in walking, not elsewhere classified  Rectal cancer metastasized to liver [facility])  ONSET DATE: 11/23/22  Rationale for Evaluation and Treatment: Rehabilitation  SUBJECTIVE:  SUBJECTIVE STATEMENT:    I was  achy in my hands and feet earlier. I can tell I moved a little more yesterday when I did my exercises, but no pain.   PERTINENT HISTORY:  Cancer of ascending colon (HCC), diagnosed in 12/2015. S/p right hemicolectomy with right salpingo oophorectomy and adjuvant chemo and radiation.Diagnosed with rectal cancer with mets to the liver in 2021. her PET from 07/12/20 indicates soft tissue mass within the right iliac fossa is noted and consistent with local tumor recurrence from previous ascending colon tumor. Restaging on 10/07/22 showed overall stable disease with multiple small lung nodules. Currently on chemo and has neuropathy and  anemia  PAIN:  Are you having pain? No not presently,  PRECAUTIONS: Other: metastatic rectal cancer to liver  WEIGHT BEARING RESTRICTIONS: No  FALLS:  Has patient fallen in last 6 months? No  LIVING ENVIRONMENT: Lives with: lives alone Lives in: House/apartment Stairs: No;  Has following equipment at home: Grab bars  OCCUPATION: retired  LEISURE: pt intentionally walks  HAND DOMINANCE: right   PRIOR LEVEL OF FUNCTION: Independent  PATIENT GOALS: "to see if it offers any other suggestions as far as the body and what else I can do for the neuropathy"   OBJECTIVE:  COGNITION: Overall cognitive status: Within functional limits for tasks assessed    SENSATION: Tingling in bilateral feet and hands  POSTURE: forward head and rounded shoulders  UPPER EXTREMITY AROM/PROM: WFL  UPPER EXTREMITY STRENGTH: 5/5   LOWER EXTREMITY MMT:  MMT Right eval  Hip flexion 2+/5  Hip extension 2+/5  Hip abduction 2+/5  Hip adduction   Hip internal rotation   Hip external rotation   Knee flexion 3+/5  Knee extension 5/5  Ankle dorsiflexion 5/5  Ankle plantarflexion   Ankle inversion   Ankle eversion    (Blank rows = not tested)  MMT LEFT eval  Hip flexion 3+/5  Hip extension 2+/5  Hip abduction 3+/5  Hip adduction   Hip internal rotation   Hip external rotation   Knee flexion 4/5  Knee extension 5/5  Ankle dorsiflexion 5/5  Ankle plantarflexion   Ankle inversion   Ankle eversion    (Blank rows = not tested)     FUNCTIONAL TESTS:  30 seconds chair stand test 11 reps without use of UEs which is average for her age  75/2/24: [name] Balance Test Sit to Stand: Able to stand without using hands and stabilize independently Standing Unsupported: Able to stand safely 2 minutes Sitting with Back Unsupported but Feet Supported on Floor or Stool: Able to sit safely and securely 2 minutes Stand to Sit: Sits safely with minimal use of hands Transfers: Able to transfer  safely, minor use of hands Standing Unsupported with Eyes Closed: Able to stand 10 seconds safely Standing Ubsupported with Feet Together: Able to place feet together independently and stand 1 minute safely From Standing, Reach Forward with Outstretched Arm: Can reach forward >12 cm safely (5") From Standing Position, Pick up Object from Floor: Able to pick up shoe safely and easily From Standing Position, Turn to Look Behind Over each Shoulder: Turn sideways only but maintains balance Turn 360 Degrees: Able to turn 360 degrees safely but slowly Standing Unsupported, Alternately Place Feet on Step/Stool: Able to stand independently and safely and complete 8 steps in 20 seconds Standing Unsupported, One Foot in Front: Needs help to step but can hold 15 seconds Standing on One Leg: Tries to lift leg/unable to hold 3 seconds but  remains standing independently Total Score: 45   GAIT: Distance walked: 25 feet Assistive device utilized: None Level of assistance: SBA Comments: foot flat, slightly unsteady appearing  SINGLE LIMB STANCE:  2 sec on R 3 sec on L  TODAY'S TREATMENT:                                                                                                                                         DATE:   01/20/2023 Nu step seat 8, UE 9 level 3 x 8 min, 530 steps Marching no HH x 10 Marching knee and foot in front 2 x 5 with HH Standing heel raises no HH x 15 6 in step ups 2 x 10, light hold on bars Standing knee flexion x 10 Standing hip abd and flexion with SLR 1# x 10 ea, then x 5 ea Ax beam 3 laps forward and 3 laps sidestepping with intermittent hand touch Supine bridge with breathing focus 2 x 5 Right hip flexor stretch supine with CR stretch x 3, repeated on left.  Quad sets into towel roll at heel x 10 01/15/2023 Nu step seat 8, UE 9 level 3 x 8 min 472 steps Seated hamstring stretch pt returned therapist demo 30 sec holds bilaterally x 2- more tight on R  side Piriformis stretch x 30 sec holds bilaterally SLR x 10 reps bilaterally with v/c to engage core and to coordinate it with diaphragmatic breathing Bridging with pt returning therapist demo x 10 with v/c to keep core engaged and to coordinate with diaphragmatic breathing Gentle suction cupping with silicone cups to gently  mobilize skin and fascia - initially with no cocoa butter and then added cocoa butter to be able to move cups  01/13/2023 Nu step seat 8, UE 9 level 2 x 7 min 330 steps Seated hamstring stretch pt returned therapist demo 30 sec holds bilaterally x 2- more tight on R side Piriformis stretch x 30 sec holds bilaterally Supine on mat being mindful not to put any weight on port - therapist placing gentle pressure over hip to extend hip with knee bent to 90 and therapist pulling lower leg back for hip flexor stretch x 3 with 1 min holds SLR x 10 reps bilaterally with increased difficulty on R side Bridging with pt returning therapist demo x 10 with v/c to keep core engaged Gentle MFR to R lower abdominal area where pt has discomfort - gently to mobilize skin and fascia  01/08/2023 Nu step seat 7, UE 9 level 3 x 7 min 324 steps Seated hamstring stretch pt returned therapist demo 30 sec holds bilaterally x 2- more tight on R side Standing quad stretch in // bars with chair behind x 60 sec holds x 2 bilaterally with pt more tight on R side Standing march 2 x 10 intermittent hand hold Heel raises in // bars occasional HH x 15  with increased difficulty keeping weight through entire foot  Bilateral gastroc stretches 1 x 60 sec with runners stretch Step onto ax forward x 10 ea, lateral x 10 ea LAQ 3 # x 10 ea Airex beam forward x 3 laps, backwards x 3 laps intermittent HH, sidestepping on airex x 3 each with increased difficulty maintaining balance on beam Tandem stance x 3 ea with intermittent HH Single limb stance x 3 each with HHA most of the time Instructed pt in mini squats and  pt returned therapist demo x 10   01/06/2023 Nu step seat 8, UE 9 lev 2-3 x 5 min 225 steps Standing march 2 x 10 intermittent hand hold Heel raises no HH x 15 Bilateral gastroc stretches 2 x 20 sec with runners stretch Step onto ax forward x 10 ea, lateral x 10 ea LAQ 3 # x 10 ea Seated HS stretch x 3 ea Airex beam forward x 3 laps, backwards x 3 laps intermittent HH Tandem stance x 3 ea with intermittent HH  12/31/22:  Performed Berg Balance test - see above pt scored a 45/50, s he had difficulty with balance with turning quickly and reaching forward with feet planted Instructed pt in seated hamstring stretch in sitting- pt required max v/c and t/c cues for this to avoid scapular protraction, twisting, keeping knee straight and foot pulled back with 20 sec holds - pt felt tight bilaterally Instructed in quad stretch with chair behind her at the counter with pt able to hold about 10 sec with good stretch felt bilaterally Tandem stance in the corner and educated pt to do this in the kitchen facing the corner with counter on either side for support  Created HEP  12/24/22: quad stretch in standing with HHA at counter  PATIENT EDUCATION:  Education details:hamstring stretch without protraction of scapula, quad stretch with chair for leg support, tandem stance in corner Person educated: Patient Education method: Explanation Education comprehension: verbalized understanding  HOME EXERCISE PROGRAM: Sit to stands without use of UEs Access Code: [email or web address] URL: [email or web address] Date: 12/31/2022 Prepared by: Leonette Most  Exercises - Seated Hamstring Stretch  - 1 x daily - 7 x weekly - 1 sets - 3-5 reps - work up to 30 sec hold - Theatre manager with Chair and Counter Support  - 1 x daily - 7 x weekly - 1 sets - 3-5 reps - work up to 30 sec  hold - Standing Tandem Balance with Counter Support  - 1 x daily - 7 x weekly - 1 sets - 5-10 reps - work up to 30 sec   hold  ASSESSMENT:  CLINICAL IMPRESSION: Continued with strengthening today and balance.  Fatigued after treatment, but felt good. Pt demonstrates weakness with standing hip flexion and has difficulty performing marching with hip in front. She is also tighter on that side, but did improve after C-Rstretching . OBJECTIVE IMPAIRMENTS: decreased activity tolerance, decreased balance, difficulty walking, decreased strength, increased fascial restrictions, impaired flexibility, impaired sensation, and postural dysfunction.   ACTIVITY LIMITATIONS: stairs and locomotion level  PARTICIPATION LIMITATIONS: cleaning, driving, and community activity  PERSONAL FACTORS: Age, Fitness, and Time since onset of injury/illness/exacerbation are also affecting patient's functional outcome.   REHAB POTENTIAL: Good  CLINICAL DECISION MAKING: Stable/uncomplicated  EVALUATION COMPLEXITY: Low  GOALS: Goals reviewed with patient? Yes  SHORT TERM GOALS: Target date: 01/21/23  Pt will be able to do SLS bilaterally for 5 sec each to decrease fall risk. Baseline: Goal status: INITIAL  2.  Pt will be independent with initial HEP for stretching and strengthening.  Baseline:  Goal status: INITIAL  3.  Pt will be able to ascend/descend the curb without difficulty to allow improved mobility.  Baseline:  Goal status: INITIAL  4.  Pt will demonstrate 3/5 R hip flexor strength to decrease fall risk.  Baseline:  Goal status: INITIAL   LONG TERM GOALS: Target date: 02/18/23  Pt will demonstrate 3+/5 hip flexor strength on the R to decrease fall risk.  Baseline:  Goal status: INITIAL  2.  Pt will be able to get in and out of the car without manually lifting her R leg. Baseline:  Goal status: INITIAL  3.  Pt will be able to ascend/descend a flight of steps with out assistance or fear of falling.  Baseline:  Goal status: INITIAL  4.  Pt will demonstrate 3/5 bilateral hip extensor strength to decrease fall  risk.  Baseline:  Goal status: INITIAL  5.  Pt will be independent in a home exercise program for continued stretching and strengthening.  Baseline:  Goal status: INITIAL   PLAN:  PT FREQUENCY: 2x/week  PT DURATION: 8 weeks  PLANNED INTERVENTIONS: Therapeutic exercises, Therapeutic activity, Neuromuscular re-education, Balance training, Gait training, Patient/Family education, Self Care, Joint mobilization, Stair training, and Manual therapy  PLAN FOR NEXT SESSION:  NuStep, LE strengthening, balance exercises   Waynette Buttery, PT 01/20/2023, 10:00 AM

## 2023-01-23 ENCOUNTER — Ambulatory Visit: Payer: Federal, State, Local not specified - PPO

## 2023-01-23 DIAGNOSIS — M6281 Muscle weakness (generalized): Secondary | ICD-10-CM

## 2023-01-23 DIAGNOSIS — R262 Difficulty in walking, not elsewhere classified: Secondary | ICD-10-CM

## 2023-01-23 DIAGNOSIS — C20 Malignant neoplasm of rectum: Secondary | ICD-10-CM

## 2023-01-23 DIAGNOSIS — R209 Unspecified disturbances of skin sensation: Secondary | ICD-10-CM

## 2023-01-23 NOTE — Therapy (Signed)
OUTPATIENT PHYSICAL THERAPY ONCOLOGY TREATMENT  Patient Name: Brittney Spencer MRN: [id number] DOB:Oct 10, 1950, 72 y.o., female Today's Date: 01/23/2023  END OF SESSION:  PT End of Session - 01/23/23 0850     Visit Number 8    Number of Visits 17    Date for PT Re-Evaluation 02/18/23    PT Start Time 0851    PT Stop Time 0947    PT Time Calculation (min) 56 min    Activity Tolerance Patient tolerated treatment well    Behavior During Therapy [facility] for tasks assessed/performed                Past Medical History:  Diagnosis Date   AAA (abdominal aortic aneurysm) (HCC)    infrarenal 4.1 cmper s-9-19 scan on chart   Anemia    hx of   Anxiety    has PRN meds   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 Some residual intraretinal fluid remains, very small localized subfoveal of fluid remains although this continues to slowly resorb. We'll continue to observe.   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   Resolved March 2021 post vitrectomy   Past Surgical History:  Procedure Laterality Date   COLONOSCOPY  2018   HD-hams   COLONSCOPY  12/2015   IR IMAGING GUIDED PORT INSERTION  08/04/2020   LAPAROSCOPIC RIGHT HEMI COLECTOMY Right 02/07/2016   Procedure: LAPAROSCOPIC ASSISTED RIGHT HEMI COLECTOMY AND RIGHT SALPINGO OOPHERECTOMY;  Surgeon: Glenna Fellows, MD;  Location: [facility];  Service: General;  Laterality: Right;   RETINAL DETACHMENT SURGERY  09/2019   Patient Active Problem List   Diagnosis Date Noted   Peripheral neuropathy due to chemotherapy (HCC) 11/14/2022   DNR (do not  resuscitate) 04/25/2022   Encounter for antineoplastic chemotherapy 11/29/2021   Macular hole of right eye 02/20/2021   Macular pucker, right eye 02/20/2021   Port-A-Cath in place 08/10/2020   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) 07/04/2020   History of vitrectomy 03/28/2020   Vitreomacular traction, left 03/28/2020   Nuclear sclerotic cataract of left eye 03/28/2020   Follow-up examination after eye surgery 10/21/2019   Essential hypertension 03/26/2016   Iron deficiency anemia due to chronic blood loss 03/26/2016   Cancer of ascending colon (HCC) 02/07/2016   Low hemoglobin 01/05/2016   Perforated appendicitis 09/15/2015    PCP: none  REFERRING PROVIDER: [name], MD  REFERRING DIAG: C20,C78.7 (ICD-10-CM) - Rectal cancer metastasized to liver [facility])  THERAPY DIAG:  Muscle weakness (generalized)  Unspecified disturbances of skin sensation  Difficulty in walking, not elsewhere classified  Rectal cancer metastasized to liver [facility])  ONSET DATE: 11/23/22  Rationale for Evaluation and Treatment: Rehabilitation  SUBJECTIVE:  SUBJECTIVE STATEMENT:    I did well after last visit.  PERTINENT HISTORY:  Cancer of ascending colon (HCC), diagnosed in 12/2015. S/p right hemicolectomy with right salpingo oophorectomy and adjuvant chemo and radiation.Diagnosed with rectal cancer with mets to the liver in 2021. her PET from 07/12/20 indicates soft tissue mass within the right iliac fossa is noted and consistent with local tumor recurrence from previous ascending colon tumor. Restaging on 10/07/22 showed overall stable disease with multiple small lung nodules. Currently on chemo and has neuropathy and anemia  PAIN:  Are you having pain? No not presently,A little stiffness in my feet and hands and hands are  a little tingly.  PRECAUTIONS: Other: metastatic rectal cancer to liver  WEIGHT BEARING RESTRICTIONS: No  FALLS:  Has patient fallen in last 6 months? No  LIVING ENVIRONMENT: Lives with: lives alone Lives in: House/apartment Stairs: No;  Has following equipment at home: Grab bars  OCCUPATION: retired  LEISURE: pt intentionally walks  HAND DOMINANCE: right   PRIOR LEVEL OF FUNCTION: Independent  PATIENT GOALS: "to see if it offers any other suggestions as far as the body and what else I can do for the neuropathy"   OBJECTIVE:  COGNITION: Overall cognitive status: Within functional limits for tasks assessed    SENSATION: Tingling in bilateral feet and hands  POSTURE: forward head and rounded shoulders  UPPER EXTREMITY AROM/PROM: WFL  UPPER EXTREMITY STRENGTH: 5/5   LOWER EXTREMITY MMT:  MMT Right eval  Hip flexion 2+/5  Hip extension 2+/5  Hip abduction 2+/5  Hip adduction   Hip internal rotation   Hip external rotation   Knee flexion 3+/5  Knee extension 5/5  Ankle dorsiflexion 5/5  Ankle plantarflexion   Ankle inversion   Ankle eversion    (Blank rows = not tested)  MMT LEFT eval  Hip flexion 3+/5  Hip extension 2+/5  Hip abduction 3+/5  Hip adduction   Hip internal rotation   Hip external rotation   Knee flexion 4/5  Knee extension 5/5  Ankle dorsiflexion 5/5  Ankle plantarflexion   Ankle inversion   Ankle eversion    (Blank rows = not tested)     FUNCTIONAL TESTS:  30 seconds chair stand test 11 reps without use of UEs which is average for her age  79/2/24: [name] Balance Test Sit to Stand: Able to stand without using hands and stabilize independently Standing Unsupported: Able to stand safely 2 minutes Sitting with Back Unsupported but Feet Supported on Floor or Stool: Able to sit safely and securely 2 minutes Stand to Sit: Sits safely with minimal use of hands Transfers: Able to transfer safely, minor use of hands Standing  Unsupported with Eyes Closed: Able to stand 10 seconds safely Standing Ubsupported with Feet Together: Able to place feet together independently and stand 1 minute safely From Standing, Reach Forward with Outstretched Arm: Can reach forward >12 cm safely (5") From Standing Position, Pick up Object from Floor: Able to pick up shoe safely and easily From Standing Position, Turn to Look Behind Over each Shoulder: Turn sideways only but maintains balance Turn 360 Degrees: Able to turn 360 degrees safely but slowly Standing Unsupported, Alternately Place Feet on Step/Stool: Able to stand independently and safely and complete 8 steps in 20 seconds Standing Unsupported, One Foot in Front: Needs help to step but can hold 15 seconds Standing on One Leg: Tries to lift leg/unable to hold 3 seconds but remains standing independently Total Score: 45   GAIT:  Distance walked: 25 feet Assistive device utilized: None Level of assistance: SBA Comments: foot flat, slightly unsteady appearing  SINGLE LIMB STANCE:  2 sec on R 3 sec on L  TODAY'S TREATMENT:                                                                                                                                         DATE:  01/23/2023  Nu step seat 8, UE 9, level 3 x 8 min 629 steps Hip flexion 2 x10 B, marching x 10 alternating with HH Standing heel raises no HH x 15 6 in step ups 1 x 10, light hold on bars Foot placement forward on step left x 10, right x 10 6 in step ups repeated x 10 ea Foot placement laterally on step x 10 ea Incline stretch at bars 3 x 20 sec Ax beam x 3 laps forward and 2 laps sideways Bridging 2 x 5 coordinating breathing Right hip fle    01/20/2023 Nu step seat 8, UE 9 level 3 x 8 min, 530 steps Marching no HH x 10 Marching knee and foot in front 2 x 5 with HH Standing heel raises no HH x 15 6 in step ups 2 x 10, light hold on bars Standing knee flexion x 10 Standing hip abd and flexion with  SLR 1# x 10 ea, then x 5 ea Ax beam 3 laps forward and 3 laps sidestepping with intermittent hand touch Supine bridge with breathing focus 2 x 5 Right hip flexor stretch supine with CR stretch x 3, repeated on left.  Quad sets into towel roll at heel x 10 01/15/2023 Nu step seat 8, UE 9 level 3 x 8 min 472 steps Seated hamstring stretch pt returned therapist demo 30 sec holds bilaterally x 2- more tight on R side Piriformis stretch x 30 sec holds bilaterally SLR x 10 reps bilaterally with v/c to engage core and to coordinate it with diaphragmatic breathing Bridging with pt returning therapist demo x 10 with v/c to keep core engaged and to coordinate with diaphragmatic breathing Gentle suction cupping with silicone cups to gently  mobilize skin and fascia - initially with no cocoa butter and then added cocoa butter to be able to move cups  01/13/2023 Nu step seat 8, UE 9 level 2 x 7 min 330 steps Seated hamstring stretch pt returned therapist demo 30 sec holds bilaterally x 2- more tight on R side Piriformis stretch x 30 sec holds bilaterally Supine on mat being mindful not to put any weight on port - therapist placing gentle pressure over hip to extend hip with knee bent to 90 and therapist pulling lower leg back for hip flexor stretch x 3 with 1 min holds SLR x 10 reps bilaterally with increased difficulty on R side Bridging with pt returning therapist demo x 10 with v/c to keep core engaged  Gentle MFR to R lower abdominal area where pt has discomfort - gently to mobilize skin and fascia  01/08/2023 Nu step seat 7, UE 9 level 3 x 7 min 324 steps Seated hamstring stretch pt returned therapist demo 30 sec holds bilaterally x 2- more tight on R side Standing quad stretch in // bars with chair behind x 60 sec holds x 2 bilaterally with pt more tight on R side Standing march 2 x 10 intermittent hand hold Heel raises in // bars occasional HH x 15 with increased difficulty keeping weight through  entire foot  Bilateral gastroc stretches 1 x 60 sec with runners stretch Step onto ax forward x 10 ea, lateral x 10 ea LAQ 3 # x 10 ea Airex beam forward x 3 laps, backwards x 3 laps intermittent HH, sidestepping on airex x 3 each with increased difficulty maintaining balance on beam Tandem stance x 3 ea with intermittent HH Single limb stance x 3 each with HHA most of the time Instructed pt in mini squats and pt returned therapist demo x 10   01/06/2023 Nu step seat 8, UE 9 lev 2-3 x 5 min 225 steps Standing march 2 x 10 intermittent hand hold Heel raises no HH x 15 Bilateral gastroc stretches 2 x 20 sec with runners stretch Step onto ax forward x 10 ea, lateral x 10 ea LAQ 3 # x 10 ea Seated HS stretch x 3 ea Airex beam forward x 3 laps, backwards x 3 laps intermittent HH Tandem stance x 3 ea with intermittent HH  12/31/22:  Performed Berg Balance test - see above pt scored a 45/50, s he had difficulty with balance with turning quickly and reaching forward with feet planted Instructed pt in seated hamstring stretch in sitting- pt required max v/c and t/c cues for this to avoid scapular protraction, twisting, keeping knee straight and foot pulled back with 20 sec holds - pt felt tight bilaterally Instructed in quad stretch with chair behind her at the counter with pt able to hold about 10 sec with good stretch felt bilaterally Tandem stance in the corner and educated pt to do this in the kitchen facing the corner with counter on either side for support  Created HEP  12/24/22: quad stretch in standing with HHA at counter  PATIENT EDUCATION:  Education details:hamstring stretch without protraction of scapula, quad stretch with chair for leg support, tandem stance in corner Person educated: Patient Education method: Explanation Education comprehension: verbalized understanding  HOME EXERCISE PROGRAM: Sit to stands without use of UEs Access Code: [email or web address] URL:  [email or web address] Date: 12/31/2022 Prepared by: Leonette Most  Exercises - Seated Hamstring Stretch  - 1 x daily - 7 x weekly - 1 sets - 3-5 reps - work up to 30 sec hold - Theatre manager with Chair and Counter Support  - 1 x daily - 7 x weekly - 1 sets - 3-5 reps - work up to 30 sec  hold - Standing Tandem Balance with Counter Support  - 1 x daily - 7 x weekly - 1 sets - 5-10 reps - work up to 30 sec  hold  ASSESSMENT:  CLINICAL IMPRESSION:  Pt worked hard in clinic today doing more steps on the Nu step and working hard on strength and balance. Her quads, hip flexors stretch out easily after exercising and she can get her right leg flat on the table. She requires continued work on balance as she is slightly fearful of challenging  her balance and uses her hands a lot . OBJECTIVE IMPAIRMENTS: decreased activity tolerance, decreased balance, difficulty walking, decreased strength, increased fascial restrictions, impaired flexibility, impaired sensation, and postural dysfunction.   ACTIVITY LIMITATIONS: stairs and locomotion level  PARTICIPATION LIMITATIONS: cleaning, driving, and community activity  PERSONAL FACTORS: Age, Fitness, and Time since onset of injury/illness/exacerbation are also affecting patient's functional outcome.   REHAB POTENTIAL: Good  CLINICAL DECISION MAKING: Stable/uncomplicated  EVALUATION COMPLEXITY: Low  GOALS: Goals reviewed with patient? Yes  SHORT TERM GOALS: Target date: 01/21/23  Pt will be able to do SLS bilaterally for 5 sec each to decrease fall risk. Baseline: Goal status: INITIAL  2.  Pt will be independent with initial HEP for stretching and strengthening.  Baseline:  Goal status: INITIAL  3.  Pt will be able to ascend/descend the curb without difficulty to allow improved mobility.  Baseline:  Goal status: INITIAL  4.  Pt will demonstrate 3/5 R hip flexor strength to decrease fall risk.  Baseline:  Goal  status: INITIAL   LONG TERM GOALS: Target date: 02/18/23  Pt will demonstrate 3+/5 hip flexor strength on the R to decrease fall risk.  Baseline:  Goal status: INITIAL  2.  Pt will be able to get in and out of the car without manually lifting her R leg. Baseline:  Goal status: INITIAL  3.  Pt will be able to ascend/descend a flight of steps with out assistance or fear of falling.  Baseline:  Goal status: INITIAL  4.  Pt will demonstrate 3/5 bilateral hip extensor strength to decrease fall risk.  Baseline:  Goal status: INITIAL  5.  Pt will be independent in a home exercise program for continued stretching and strengthening.  Baseline:  Goal status: INITIAL   PLAN:  PT FREQUENCY: 2x/week  PT DURATION: 8 weeks  PLANNED INTERVENTIONS: Therapeutic exercises, Therapeutic activity, Neuromuscular re-education, Balance training, Gait training, Patient/Family education, Self Care, Joint mobilization, Stair training, and Manual therapy  PLAN FOR NEXT SESSION:  NuStep, LE strengthening, balance exercises   Waynette Buttery, PT 01/23/2023, 9:57 AM

## 2023-02-04 ENCOUNTER — Ambulatory Visit: Payer: Federal, State, Local not specified - PPO | Attending: Hematology

## 2023-02-04 DIAGNOSIS — C787 Secondary malignant neoplasm of liver and intrahepatic bile duct: Secondary | ICD-10-CM | POA: Insufficient documentation

## 2023-02-04 DIAGNOSIS — M6281 Muscle weakness (generalized): Secondary | ICD-10-CM | POA: Diagnosis present

## 2023-02-04 DIAGNOSIS — C20 Malignant neoplasm of rectum: Secondary | ICD-10-CM | POA: Insufficient documentation

## 2023-02-04 DIAGNOSIS — R209 Unspecified disturbances of skin sensation: Secondary | ICD-10-CM | POA: Insufficient documentation

## 2023-02-04 DIAGNOSIS — R262 Difficulty in walking, not elsewhere classified: Secondary | ICD-10-CM | POA: Diagnosis present

## 2023-02-04 NOTE — Therapy (Signed)
OUTPATIENT PHYSICAL THERAPY ONCOLOGY TREATMENT  Patient Name: Brittney Spencer MRN: [id number] DOB:10/21/1950, 72 y.o., female Today's Date: 02/04/2023  END OF SESSION:  PT End of Session - 02/04/23 0800     Visit Number 9    Number of Visits 17    Date for PT Re-Evaluation 02/18/23    PT Start Time 0801    PT Stop Time 0850    PT Time Calculation (min) 49 min    Activity Tolerance Patient tolerated treatment well    Behavior During Therapy 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 Some residual intraretinal fluid remains, very small localized subfoveal of fluid remains although this continues to slowly resorb. We'll continue to observe.   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   Resolved March 2021 post vitrectomy   Past Surgical History:  Procedure Laterality Date   COLONOSCOPY  2018   HD-hams   COLONSCOPY  12/2015   IR IMAGING GUIDED PORT INSERTION  08/04/2020   LAPAROSCOPIC RIGHT HEMI COLECTOMY Right 02/07/2016   Procedure: LAPAROSCOPIC ASSISTED RIGHT HEMI COLECTOMY AND RIGHT SALPINGO OOPHERECTOMY;  Surgeon: Glenna Fellows, MD;  Location: [facility];  Service: General;  Laterality: Right;   RETINAL DETACHMENT SURGERY  09/2019   Patient Active Problem List   Diagnosis Date Noted   Peripheral neuropathy due to chemotherapy (HCC) 11/14/2022   DNR (do not  resuscitate) 04/25/2022   Encounter for antineoplastic chemotherapy 11/29/2021   Macular hole of right eye 02/20/2021   Macular pucker, right eye 02/20/2021   Port-A-Cath in place 08/10/2020   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) 07/04/2020   History of vitrectomy 03/28/2020   Vitreomacular traction, left 03/28/2020   Nuclear sclerotic cataract of left eye 03/28/2020   Follow-up examination after eye surgery 10/21/2019   Essential hypertension 03/26/2016   Iron deficiency anemia due to chronic blood loss 03/26/2016   Cancer of ascending colon (HCC) 02/07/2016   Low hemoglobin 01/05/2016   Perforated appendicitis 09/15/2015    PCP: none  REFERRING PROVIDER: [name], MD  REFERRING DIAG: C20,C78.7 (ICD-10-CM) - Rectal cancer metastasized to liver [facility])  THERAPY DIAG:  Muscle weakness (generalized)  Unspecified disturbances of skin sensation  Difficulty in walking, not elsewhere classified  Rectal cancer metastasized to liver [facility])  ONSET DATE: 11/23/22  Rationale for Evaluation and Treatment: Rehabilitation  SUBJECTIVE:  SUBJECTIVE STATEMENT:    I was on vacation. My legs are sore from exercising with a different group. I noticed while I was on vacation that the stairs didn't challenge me much, and my flexibility seems to be better  PERTINENT HISTORY:  Cancer of ascending colon (HCC), diagnosed in 12/2015. S/p right hemicolectomy with right salpingo oophorectomy and adjuvant chemo and radiation.Diagnosed with rectal cancer with mets to the liver in 2021. her PET from 07/12/20 indicates soft tissue mass within the right iliac fossa is noted and consistent with local tumor recurrence from previous ascending colon tumor. Restaging on 10/07/22 showed overall stable disease with multiple small lung  nodules. Currently on chemo and has neuropathy and anemia  PAIN:  Are you having pain? No not presently,A little stiffness in my feet and hands and hands are a little tingly.  PRECAUTIONS: Other: metastatic rectal cancer to liver  WEIGHT BEARING RESTRICTIONS: No  FALLS:  Has patient fallen in last 6 months? No  LIVING ENVIRONMENT: Lives with: lives alone Lives in: House/apartment Stairs: No;  Has following equipment at home: Grab bars  OCCUPATION: retired  LEISURE: pt intentionally walks  HAND DOMINANCE: right   PRIOR LEVEL OF FUNCTION: Independent  PATIENT GOALS: "to see if it offers any other suggestions as far as the body and what else I can do for the neuropathy"   OBJECTIVE:  COGNITION: Overall cognitive status: Within functional limits for tasks assessed    SENSATION: Tingling in bilateral feet and hands  POSTURE: forward head and rounded shoulders  UPPER EXTREMITY AROM/PROM: WFL  UPPER EXTREMITY STRENGTH: 5/5   LOWER EXTREMITY MMT:  MMT Right eval  Hip flexion 2+/5  Hip extension 2+/5  Hip abduction 2+/5  Hip adduction   Hip internal rotation   Hip external rotation   Knee flexion 3+/5  Knee extension 5/5  Ankle dorsiflexion 5/5  Ankle plantarflexion   Ankle inversion   Ankle eversion    (Blank rows = not tested)  MMT LEFT eval  Hip flexion 3+/5  Hip extension 2+/5  Hip abduction 3+/5  Hip adduction   Hip internal rotation   Hip external rotation   Knee flexion 4/5  Knee extension 5/5  Ankle dorsiflexion 5/5  Ankle plantarflexion   Ankle inversion   Ankle eversion    (Blank rows = not tested)     FUNCTIONAL TESTS:  30 seconds chair stand test 11 reps without use of UEs which is average for her age  44/2/24: [name] Balance Test Sit to Stand: Able to stand without using hands and stabilize independently Standing Unsupported: Able to stand safely 2 minutes Sitting with Back Unsupported but Feet Supported on Floor or Stool: Able  to sit safely and securely 2 minutes Stand to Sit: Sits safely with minimal use of hands Transfers: Able to transfer safely, minor use of hands Standing Unsupported with Eyes Closed: Able to stand 10 seconds safely Standing Ubsupported with Feet Together: Able to place feet together independently and stand 1 minute safely From Standing, Reach Forward with Outstretched Arm: Can reach forward >12 cm safely (5") From Standing Position, Pick up Object from Floor: Able to pick up shoe safely and easily From Standing Position, Turn to Look Behind Over each Shoulder: Turn sideways only but maintains balance Turn 360 Degrees: Able to turn 360 degrees safely but slowly Standing Unsupported, Alternately Place Feet on Step/Stool: Able to stand independently and safely and complete 8 steps in 20 seconds Standing Unsupported, One Foot in Front: Needs help  to step but can hold 15 seconds Standing on One Leg: Tries to lift leg/unable to hold 3 seconds but remains standing independently Total Score: 45   GAIT: Distance walked: 25 feet Assistive device utilized: None Level of assistance: SBA Comments: foot flat, slightly unsteady appearing  SINGLE LIMB STANCE:  2 sec on R 3 sec on L  TODAY'S TREATMENT:                                                                                                                                         DATE:   02/04/2023 Nu step seat 8, UE 9, level 4 x 8 min, 528 steps Bilateral piriformis stretch x 2  Hip flexion x 10 bilaterally with hand hold, x 10 B no HH Right hip flexor stretch standing x 3 Standing heel raises x 15 no HH SLS x 5 ea; spent time practicing wt shifting to get pt used to shifting wt entirely over stance leg before raising opposite foot 8 in partial step up x 10 emphasis on lowering slowly B, 8 in foot placement Bilaterally x 5 no hands 8 in lateral foot placement x 10 B no HH 01/23/2023 Nu step seat 8, UE 9, level 3 x 8 min 629 steps Hip  flexion 2 x10 B, marching x 10 alternating with HH Standing heel raises no HH x 15 6 in step ups 1 x 10, light hold on bars Foot placement forward on step left x 10, right x 10 6 in step ups repeated x 10 ea Foot placement laterally on step x 10 ea Incline stretch at bars 3 x 20 sec Ax beam x 3 laps forward and 2 laps sideways Bridging 2 x 5 coordinating breathing Right hip fle    01/20/2023 Nu step seat 8, UE 9 level 3 x 8 min, 530 steps Marching no HH x 10 Marching knee and foot in front 2 x 5 with HH Standing heel raises no HH x 15 6 in step ups 2 x 10, light hold on bars Standing knee flexion x 10 Standing hip abd and flexion with SLR 1# x 10 ea, then x 5 ea Ax beam 3 laps forward and 3 laps sidestepping with intermittent hand touch Supine bridge with breathing focus 2 x 5 Right hip flexor stretch supine with CR stretch x 3, repeated on left.  Quad sets into towel roll at heel x 10 01/15/2023 Nu step seat 8, UE 9 level 3 x 8 min 472 steps Seated hamstring stretch pt returned therapist demo 30 sec holds bilaterally x 2- more tight on R side Piriformis stretch x 30 sec holds bilaterally SLR x 10 reps bilaterally with v/c to engage core and to coordinate it with diaphragmatic breathing Bridging with pt returning therapist demo x 10 with v/c to keep core engaged and to coordinate with diaphragmatic breathing Gentle suction cupping with silicone cups to  gently  mobilize skin and fascia - initially with no cocoa butter and then added cocoa butter to be able to move cups  01/13/2023 Nu step seat 8, UE 9 level 2 x 7 min 330 steps Seated hamstring stretch pt returned therapist demo 30 sec holds bilaterally x 2- more tight on R side Piriformis stretch x 30 sec holds bilaterally Supine on mat being mindful not to put any weight on port - therapist placing gentle pressure over hip to extend hip with knee bent to 90 and therapist pulling lower leg back for hip flexor stretch x 3 with 1 min  holds SLR x 10 reps bilaterally with increased difficulty on R side Bridging with pt returning therapist demo x 10 with v/c to keep core engaged Gentle MFR to R lower abdominal area where pt has discomfort - gently to mobilize skin and fascia  01/08/2023 Nu step seat 7, UE 9 level 3 x 7 min 324 steps Seated hamstring stretch pt returned therapist demo 30 sec holds bilaterally x 2- more tight on R side Standing quad stretch in // bars with chair behind x 60 sec holds x 2 bilaterally with pt more tight on R side Standing march 2 x 10 intermittent hand hold Heel raises in // bars occasional HH x 15 with increased difficulty keeping weight through entire foot  Bilateral gastroc stretches 1 x 60 sec with runners stretch Step onto ax forward x 10 ea, lateral x 10 ea LAQ 3 # x 10 ea Airex beam forward x 3 laps, backwards x 3 laps intermittent HH, sidestepping on airex x 3 each with increased difficulty maintaining balance on beam Tandem stance x 3 ea with intermittent HH Single limb stance x 3 each with HHA most of the time Instructed pt in mini squats and pt returned therapist demo x 10   01/06/2023 Nu step seat 8, UE 9 lev 2-3 x 5 min 225 steps Standing march 2 x 10 intermittent hand hold Heel raises no HH x 15 Bilateral gastroc stretches 2 x 20 sec with runners stretch Step onto ax forward x 10 ea, lateral x 10 ea LAQ 3 # x 10 ea Seated HS stretch x 3 ea Airex beam forward x 3 laps, backwards x 3 laps intermittent HH Tandem stance x 3 ea with intermittent HH  12/31/22:  Performed Berg Balance test - see above pt scored a 45/50, s he had difficulty with balance with turning quickly and reaching forward with feet planted Instructed pt in seated hamstring stretch in sitting- pt required max v/c and t/c cues for this to avoid scapular protraction, twisting, keeping knee straight and foot pulled back with 20 sec holds - pt felt tight bilaterally Instructed in quad stretch with chair behind her at  the counter with pt able to hold about 10 sec with good stretch felt bilaterally Tandem stance in the corner and educated pt to do this in the kitchen facing the corner with counter on either side for support  Created HEP  12/24/22: quad stretch in standing with HHA at counter  PATIENT EDUCATION:  Education details:hamstring stretch without protraction of scapula, quad stretch with chair for leg support, tandem stance in corner Person educated: Patient Education method: Explanation Education comprehension: verbalized understanding  HOME EXERCISE PROGRAM: Sit to stands without use of UEs Access Code: [email or web address] URL: [email or web address] Date: 12/31/2022 Prepared by: Leonette Most  Exercises - Seated Hamstring Stretch  - 1 x daily - 7 x weekly - 1  sets - 3-5 reps - work up to 30 sec hold - Stage manager  - 1 x daily - 7 x weekly - 1 sets - 3-5 reps - work up to 30 sec  hold - Standing Tandem Balance with Counter Support  - 1 x daily - 7 x weekly - 1 sets - 5-10 reps - work up to 30 sec  hold  ASSESSMENT:  CLINICAL IMPRESSION: Pt achieved STG #2 and LTG # 3. Practiced shifting wt properly to be able to maintain SLS and pt is to practice at home.  She works hard in clinic and is compliant with HEP. She still uses extraneous movements with multiple exercises but is improving with focus. Legs fatigued after treatment. . OBJECTIVE IMPAIRMENTS: decreased activity tolerance, decreased balance, difficulty walking, decreased strength, increased fascial restrictions, impaired flexibility, impaired sensation, and postural dysfunction.   ACTIVITY LIMITATIONS: stairs and locomotion level  PARTICIPATION LIMITATIONS: cleaning, driving, and community activity  PERSONAL FACTORS: Age, Fitness, and Time since onset of injury/illness/exacerbation are also affecting patient's functional outcome.   REHAB POTENTIAL: Good  CLINICAL DECISION MAKING:  Stable/uncomplicated  EVALUATION COMPLEXITY: Low  GOALS: Goals reviewed with patient? Yes  SHORT TERM GOALS: Target date: 01/21/23  Pt will be able to do SLS bilaterally for 5 sec each to decrease fall risk. Baseline: Goal status: INITIAL  2.  Pt will be independent with initial HEP for stretching and strengthening.  Baseline:  Goal status: MET  02/04/2023 3.  Pt will be able to ascend/descend the curb without difficulty to allow improved mobility.  Baseline:  Goal status: INITIAL  4.  Pt will demonstrate 3/5 R hip flexor strength to decrease fall risk.  Baseline:  Goal status: INITIAL   LONG TERM GOALS: Target date: 02/18/23  Pt will demonstrate 3+/5 hip flexor strength on the R to decrease fall risk.  Baseline:  Goal status: INITIAL  2.  Pt will be able to get in and out of the car without manually lifting her R leg. Baseline:  Goal status: INITIAL  3.  Pt will be able to ascend/descend a flight of steps with out assistance or fear of falling.  Baseline:  Goal status:MET 02/04/2023 using rail 4.  Pt will demonstrate 3/5 bilateral hip extensor strength to decrease fall risk.  Baseline:  Goal status: INITIAL  5.  Pt will be independent in a home exercise program for continued stretching and strengthening.  Baseline:  Goal status: INITIAL   PLAN:  PT FREQUENCY: 2x/week  PT DURATION: 8 weeks  PLANNED INTERVENTIONS: Therapeutic exercises, Therapeutic activity, Neuromuscular re-education, Balance training, Gait training, Patient/Family education, Self Care, Joint mobilization, Stair training, and Manual therapy  PLAN FOR NEXT SESSION:  NuStep, LE strengthening, balance exercises   Waynette Buttery, PT 02/04/2023, 8:52 AM

## 2023-02-05 MED FILL — Dexamethasone Sodium Phosphate Inj 100 MG/10ML: INTRAMUSCULAR | Qty: 1 | Status: AC

## 2023-02-05 NOTE — Progress Notes (Deleted)
[facility]   [phone] Fax:([phone]) [phone]   Clinic Follow up Note   Patient Care Team: Patient, No Pcp Per as PCP - General (General Practice) [name], NP as Nurse Practitioner (Oncology) [name], MD as Consulting Physician (Oncology) [name], RN (Inactive) as Oncology Nurse Navigator  Date of Service:  02/05/2023  CHIEF COMPLAINT: f/u of metastatic rectal cancer, h/o colon cancer          CURRENT THERAPY:   FOLFOX with bevacizumab, q14d, starting 03/28/22  ASSESSMENT: Brittney Spencer is a 72 y.o. 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  -She began first-line chemo with dose-reduced FOLFIRI and Beva in 2/22.  -we changed to FOLFOX with continued Beva on 03/28/22. She tolerated well overall with some fatigue and cold sensitivity. -I personally reviewed her restaging CT from 10/07/2022 which showed overall SD in liver and rectum, stable and indermininate lung nodules, no other new lesions  -She is tolerating chemotherapy overall well, will continue. -I personally reviewed her restaging CT scan from October 07, 2022, which showed overall stable disease.  She has multiple small lung nodules, indeterminate, including one 7 mm nodule, will continue monitoring. -She has developed neuropathy in her hands and feet, grade 2, we stopped oxaliplatin in 09/2022 -her restaging CT from 12/30/2022 showed cancer progression in her liver, I personally reviewed her scan images with patient.  Her tumor marker has been trending up also, which is consistent with disease progression. -We discussed option of adding oxaliplatin back, or try third line therapy with Lonsurf and bevacizumab.  After lengthy discussion, we decided to add oxaliplatin back at reduced dose and additional premedication to prevent  infusion reaction.  Her neuropathy is overall mild and stable. -We again discussed that the goal of care is palliative, she prefers quality of life over quantity of life, if she has significant side effect from chemo, will stop chemotherapy early.  Cancer of ascending colon (HCC) diagnosed in 12/2015. S/p right hemicolectomy with right salpingo oophorectomy and adjuvant ChemoRT. 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 which we stopped in 09/2022 -stable overall  -I recommended her to take over-the-counter B12       PLAN:   SUMMARY OF ONCOLOGIC HISTORY: Oncology History Overview Note  Cancer Staging Cancer of ascending colon [facility]) Staging form: Colon and Rectum, AJCC 7th Edition - Clinical stage from 02/07/2016: Stage IIIC (T4b, N1b, M0) - Signed by [name], MD on 03/04/2016 Laterality: Right Residual tumor (R): R2 - Macroscopic    Cancer of ascending colon (HCC)  10/25/2015 Imaging   CT ABD/PELVIS:  Inflammatory changes inferior to the cecal tip appear improved, there is still irregular soft tissue thickening of the cecal tip, and there are adjacent prominent lymph nodes in the ileocolonic mesentery, measuring 13 mm on image 49 and 8 mm on image 52. In addition, there is a 2.5 x 1.8 cm nodule on image 46 which has  central low density. Therefore, these findings are moderately suspicious for an underlying cecal malignancy with perforation.    01/19/2016 Procedure   COLONOSCOPY per Dr. [name]: Fungating, ulcerated mass almost obstructing mid ascending colon   01/19/2016 Initial  Biopsy   Diagnosis Surgical [P], cecal mass - INVASIVE ADENOCARCINOMA WITH ULCERATION. - SEE COMMENT.   02/05/2016 Tumor Marker   Patient's tumor was tested for the following markers: CEA Results of the tumor marker test revealed 5.7.   02/07/2016 Initial Diagnosis   Cancer of ascending colon (HCC)   02/07/2016 Definitive Surgery   Laparoscopic assisted right hemicolectomy and right salpingo oopherectomy--[name]   02/07/2016 Pathologic Stage   p T4 N1b   2/43 nodes +   02/07/2016 Pathology Results   MMR normal; G2 adenocarcinoma;proximal & distal margins negative; soft tissue mass on pelvic sidewall + for adenocarcinoma with positive margin MSI Stable   03/08/2016 Imaging   CT chest negative for metastasis.    03/19/2016 - 04/25/2016 Radiation Therapy   Adjuvant irradiation, 50 gray in 28 fractions   03/19/2016 - 04/22/2016 Chemotherapy   Xeloda 1500 mg twice daily, started on 03/19/2016, dose reduced to 1000 mg twice daily from week 3 due to neutropenia, and patient stopped 3 days before last dose radiation due to difficulty swallowing the pill    05/20/2016 -  Adjuvant Chemotherapy   Patient declined adjuvant chemotherapy   09/16/2016 Imaging   CT CAP w Contrast 1. No evidence of local tumor recurrence at the ileocolic anastomosis. 2. No findings suspicious for metastatic disease in the chest, abdomen or pelvis. 3. Nonspecific trace free fluid in the pelvic cul-de-sac. 4. Stable solitary 3 mm right upper lobe pulmonary nodule, for which 6 month stability has been demonstrated, probably benign. 5. Additional findings include stable right posterior pericardial cyst and small calcified uterine fibroids.   05/13/2017 Imaging   CT CAP W Contrast 05/13/17 IMPRESSION: 1. No current findings of residual or recurrent malignancy. 2. Mild prominence of stool throughout the colon. Nondistended portions of the rectum. 3. Several tiny pulmonary nodules are stable from the  earliest available comparison of 03/08/2016 and probably benign, but may merit surveillance. 4. Other imaging findings of potential clinical significance: Old granulomatous disease. Aortoiliac atherosclerotic vascular disease. Lumbar spondylosis and degenerative disc disease. Stable amount of trace free pelvic fluid.   04/27/2018 Imaging   04/27/2018 CT CAP IMPRESSION: Stable exam. No evidence of recurrent or metastatic carcinoma within the chest, abdomen, or pelvis   04/19/2019 Imaging   CT CAP W Contrast  IMPRESSION: Chest Impression:   1. No evidence of thoracic metastasis. 2. Stable small bilateral pulmonary nodules.   Abdomen / Pelvis Impression:   1. No evidence local colorectal carcinoma recurrence or metastasis in the abdomen pelvis. 2. Post RIGHT hemicolectomy.   01/22/2021 Imaging   CT CAP  IMPRESSION: CT CHEST IMPRESSION   1. Similar nonspecific pulmonary nodules. 2. New posterior left upper lobe reticulonodular opacity, suspicious for interval mild infection or inflammation. 3. No thoracic adenopathy.   CT ABDOMEN AND PELVIS IMPRESSION   1. Further decrease in size of high left hepatic lobe 3 mm low-density lesion. No new or progressive metastatic disease within the abdomen or pelvis. 2. Similar trace free pelvic fluid. 3. Similar nonspecific mid rectal wall thickening. 4.  Aortic Atherosclerosis (ICD10-I70.0).   04/23/2021 Imaging   CT CAP  IMPRESSION: 1. Treated metastatic lesion between segments 2 and 3 of the liver, slightly smaller and less distinct than prior examination. No other  signs of definite metastatic disease elsewhere in the abdomen or pelvis. 2. Multiple small pulmonary nodules, stable compared to the prior examination, favored to be benign. No definitive findings to suggest metastatic disease to the thorax. 3. Aortic atherosclerosis. 4. Additional incidental findings, as above.   08/13/2021 Imaging   EXAM: CT CHEST, ABDOMEN,  AND PELVIS WITH CONTRAST  IMPRESSION: 1. A previously seen PET avid lesion of the anterior left lobe of the liver, hepatic segment II, is no longer discretely appreciable consistent with treatment response of a hepatic metastasis. 2. No evidence of new metastatic disease in the chest, abdomen, or pelvis. 3. Interval increase in a small focus of consolidation and nodularity of the medial left upper lobe, consistent with minimal, ongoing atypical infection. Additional tiny bilateral pulmonary nodules are stable and almost certainly incidental benign. Attention on follow-up. 4. Status post right hemicolectomy and ileocolic anastomosis.   07/02/2022 Imaging    IMPRESSION: CHEST IMPRESSION:   1. No evidence of thoracic metastasis. 2. Stable small pulmonary nodules.   PELVIS IMPRESSION:   1. Stable to slight decrease in size of subcapsular lesion in the RIGHT hepatic lobe. 2. No evidence of new or progressive disease in the abdomen pelvis. 3.  Aortic Atherosclerosis (ICD10-I70.0).     12/31/2022 Imaging    IMPRESSION: 1. Right hepatic lobe metastasis has undergone mild-to-moderate enlargement since 10/07/2022. A left hepatic lobe 5 mm lesion is not readily apparent on the prior, suspicious for a new metastasis. 2. Similar nonspecific tiny pulmonary nodules. 3. Similar amorphous soft tissue thickening within the posterior superior right hemipelvis, hypermetabolic on prior PET and indeterminate for residual disease versus scarring. 4. Similar equivocal soft tissue fullness within the rectum. 5. Increased size of an abdominopelvic ventral wall nodule for which subcutaneous metastasis are a concern. 6. New trace right pelvic fluid. 7. Incidental findings, including: Aortic Atherosclerosis (ICD10-I70.0). Possible constipation   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)  06/15/2020 Procedure   Screening Colonoscopy by Dr [name]  IMPRESSION - Decreased sphincter tone and internal  hemorrhoids that prolapse with straining, but require manual replacement into the anal canal (Grade III) found on digital rectal exam. - Patent side-to-side ileo-colonic anastomosis, characterized by healthy appearing mucosa. - The examined portion of the ileum was normal. - One diminutive polyp in the proximal transverse colon, removed with a cold biopsy forceps. Resected and retrieved. - Likely malignant partially obstructing tumor in the mid rectum. Biopsied. Tattooed. - The examination was otherwise normal on direct and retroflexion views.   06/15/2020 Initial Biopsy   Diagnosis 1. Transverse Colon Polyp - HYPERPLASTIC POLYP 2. Rectum, biopsy - ADENOCARCINOMA ARISING IN A TUBULAR ADENOMA WITH HIGH-GRADE DYSPLASIA. SEE NOTE Diagnosis Note 2. Dr. Luisa Hart reviewed the case and concurs with the diagnosis. Dr. [name] was notified on 06/16/2020.   06/28/2020 Imaging   CT CAP  IMPRESSION: 1. New low-density focus in the anterior aspect of the lateral segment LEFT hepatic lobe measuring 1.2 x 1.0 cm, compatible with small metastatic lesion in the LEFT hepatic lobe. 2. Soft tissue in the RIGHT iliac fossa following RIGHT hemicolectomy invades the psoas musculature and is slowly enlarging over time, more linear on the prior study now highly concerning for recurrence/metastasis to this location. 3. Signs of enteritis, potentially post radiation changes of the small bowel. Tethered small bowel in the RIGHT lower quadrant shows focal thickening and narrowing suspicious for small bowel involvement and developing partial obstruction though currently contrast passes beyond this point into the colon. 4. Rectal thickening  in this patient with known rectal mass as described. 5. No evidence of metastatic disease in the chest. 6. Stable small pulmonary nodules. 7.  and aortic atherosclerosis.   Aortic Atherosclerosis (ICD10-I70.0) and Emphysema (ICD10-J43.9).   07/04/2020 Initial Diagnosis    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)   07/12/2020 PET scan   IMPRESSION: 1. Exam positive for FDG avid rectal tumor which corresponds to the recent colonoscopy findings. 2. FDG avid soft tissue mass within the right iliac fossa is noted and consistent with local tumor recurrence from previous ascending colon tumor. 3. Lateral segment left lobe of liver lesion is FDG avid concerning for liver metastasis. 4. No specific findings identified to suggest metastatic disease to the chest.   08/10/2020 -  Chemotherapy   First-line FOLFIRI q2weeks starting 08/10/20. dose reduced with cycle 1. Irinotecan/5FU increased and Bevacizumab added with cycle 2 on 08/23/2020    08/17/2020 - 03/09/2022 Chemotherapy   Patient is on Treatment Plan : COLORECTAL FOLFIRI + Bevacizumab q14d     10/27/2020 Imaging   CT CAP  IMPRESSION: 1. Interval decrease in size of the hypermetabolic left hepatic lesion, consistent with metastatic disease. No new liver lesion evident. 2. Interval resolution of the hypermetabolic soft tissue lesion along the right iliac fossa with no measurable soft tissue lesion remaining at this location today. 3. Similar appearance of soft tissue fullness in the rectum at the site of the hypermetabolic lesion seen previously. 4. Stable tiny bilateral pulmonary nodules. Continued attention on follow-up recommended. 5. Small volume free fluid in the pelvis. 6. Aortic Atherosclerosis (ICD10-I70.0).   01/22/2021 Imaging   CT CAP  IMPRESSION: CT CHEST IMPRESSION   1. Similar nonspecific pulmonary nodules. 2. New posterior left upper lobe reticulonodular opacity, suspicious for interval mild infection or inflammation. 3. No thoracic adenopathy.   CT ABDOMEN AND PELVIS IMPRESSION   1. Further decrease in size of high left hepatic lobe 3 mm low-density lesion. No new or progressive metastatic disease within the abdomen or pelvis. 2. Similar trace free pelvic fluid. 3. Similar  nonspecific mid rectal wall thickening. 4.  Aortic Atherosclerosis (ICD10-I70.0).   04/23/2021 Imaging   CT CAP  IMPRESSION: 1. Treated metastatic lesion between segments 2 and 3 of the liver, slightly smaller and less distinct than prior examination. No other signs of definite metastatic disease elsewhere in the abdomen or pelvis. 2. Multiple small pulmonary nodules, stable compared to the prior examination, favored to be benign. No definitive findings to suggest metastatic disease to the thorax. 3. Aortic atherosclerosis. 4. Additional incidental findings, as above.   08/13/2021 Imaging   EXAM: CT CHEST, ABDOMEN, AND PELVIS WITH CONTRAST  IMPRESSION: 1. A previously seen PET avid lesion of the anterior left lobe of the liver, hepatic segment II, is no longer discretely appreciable consistent with treatment response of a hepatic metastasis. 2. No evidence of new metastatic disease in the chest, abdomen, or pelvis. 3. Interval increase in a small focus of consolidation and nodularity of the medial left upper lobe, consistent with minimal, ongoing atypical infection. Additional tiny bilateral pulmonary nodules are stable and almost certainly incidental benign. Attention on follow-up. 4. Status post right hemicolectomy and ileocolic anastomosis.   03/28/2022 -  Chemotherapy   Patient is on Treatment Plan : COLORECTAL FOLFOX + Bevacizumab q14d     07/02/2022 Imaging    IMPRESSION: CHEST IMPRESSION:   1. No evidence of thoracic metastasis. 2. Stable small pulmonary nodules.   PELVIS IMPRESSION:   1. Stable to slight  decrease in size of subcapsular lesion in the RIGHT hepatic lobe. 2. No evidence of new or progressive disease in the abdomen pelvis.   10/07/2022 Imaging    IMPRESSION: 1. Stable hypovascular mass in the periphery of the right lobe of the liver, which likely represents a treated metastatic lesion. Stable subcentimeter lesion just medial to this is also  similar to the recent prior examination. No new hepatic lesions are otherwise noted. 2. Persistent but stable poorly defined soft tissue thickening in the right lower quadrant associated with multiple small bowel loops and the overlying iliopsoas musculature, which corresponds to focal hypermetabolism on remote prior PET-CT. Given the stability, this likely represents a treated metastatic lesion. Continued attention on follow-up studies is recommended. 3. Persistent mass-like thickening in the proximal rectum corresponding to previously noted hypermetabolic rectal neoplasm on prior PET-CT. This currently measures approximately 2.8 x 2.3 cm and is slightly more apparent than the most recent prior study. 4. Multiple small pulmonary nodules generally stable compared to the prior study, with exception of a new branching nodule in the anterior aspect of the left upper lobe measuring 7 x 3 mm (mean diameter 5 mm). This is nonspecific. Close attention on follow-up studies is recommended to ensure stability. 5. Aortic atherosclerosis. 6. Additional incidental findings, as above.     12/31/2022 Imaging    IMPRESSION: 1. Right hepatic lobe metastasis has undergone mild-to-moderate enlargement since 10/07/2022. A left hepatic lobe 5 mm lesion is not readily apparent on the prior, suspicious for a new metastasis. 2. Similar nonspecific tiny pulmonary nodules. 3. Similar amorphous soft tissue thickening within the posterior superior right hemipelvis, hypermetabolic on prior PET and indeterminate for residual disease versus scarring. 4. Similar equivocal soft tissue fullness within the rectum. 5. Increased size of an abdominopelvic ventral wall nodule for which subcutaneous metastasis are a concern. 6. New trace right pelvic fluid. 7. Incidental findings, including: Aortic Atherosclerosis (ICD10-I70.0). Possible constipation      INTERVAL HISTORY:  Brittney Spencer is here for a follow  up of metastatic rectal cancer, h/o colon cancer.   She was last seen by me on 12/31/2022. She presents to the clinic alone.       All other systems were reviewed with the patient and are negative.  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 Some residual intraretinal fluid remains, very small localized subfoveal of fluid remains although this continues to slowly resorb. We'll continue to observe.   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  ALLERGIES:  is allergic to venofer [iron sucrose], fish allergy, peanut-containing drug products, soy allergy, and buspirone.  MEDICATIONS:  Current Outpatient Medications  Medication Sig Dispense Refill   ALPRAZolam (XANAX) 0.25 MG tablet Take 1 tablet (0.25 mg total) by mouth daily  as needed for anxiety. 30 tablet 0   capecitabine (XELODA) 500 MG tablet Take 3 tablets (1,500 mg total) by mouth 2 (two) times daily after a meal. Take within 30 minutes after meals. Take for 14 days on, then off for 7 days. Repeat every 21 days. 84 tablet 1   Cholecalciferol (VITAMIN D3 PO) Take by mouth daily.     docusate sodium (COLACE) 100 MG capsule 1 capsule as needed     lidocaine-prilocaine (EMLA) cream Apply 1 Application topically as needed. 30 g 1   mirtazapine (REMERON) 7.5 MG tablet TAKE 1 TABLET BY MOUTH AT BEDTIME. 90 tablet 1   NORVASC 2.5 MG tablet TAKE 1 TABLET BY MOUTH EVERY DAY 90 tablet 1   ondansetron (ZOFRAN) 8 MG tablet Take 1 tablet (8 mg total) by mouth every 8 (eight) hours as needed for nausea or vomiting. 20 tablet 2   prochlorperazine (COMPAZINE) 10 MG tablet Take 1 tablet (10 mg total) by mouth every 6 (six) hours as needed for nausea or vomiting. 30 tablet 2   Current Facility-Administered Medications  Medication Dose Route Frequency Provider Last Rate Last Admin   0.9 %  sodium chloride infusion  500 mL Intravenous Once Danis, Starr Lake III, MD        PHYSICAL EXAMINATION: ECOG PERFORMANCE STATUS: 1 - Symptomatic but completely ambulatory  There were no vitals filed for this visit.  Wt Readings from Last 3 Encounters:  01/09/23 105 lb 4 oz (47.7 kg)  01/01/23 105 lb 9.6 oz (47.9 kg)  12/18/22 102 lb 11.2 oz (46.6 kg)     GENERAL:alert, no distress and comfortable SKIN: skin color normal, no rashes or significant lesions EYES: normal, Conjunctiva are pink and non-injected, sclera clear  NEURO: alert & oriented x 3 with fluent speech  LABORATORY DATA:  I have reviewed the data as listed    Latest Ref Rng & Units 01/09/2023    8:02 AM 12/30/2022    8:50 AM 12/18/2022    9:31 AM  CBC  WBC 4.0 - 10.5 K/uL 3.5  6.3  4.8   Hemoglobin 12.0 - 15.0 g/dL 40.9  81.1  91.4   Hematocrit 36.0 - 46.0 % 32.8  32.9  33.8   Platelets 150 - 400 K/uL 137  110  109          Latest Ref Rng & Units 01/09/2023    8:02 AM 12/30/2022    8:50 AM 12/18/2022    9:31 AM  CMP  Glucose 70 - 99 mg/dL 75  81  93   BUN 8 - 23 mg/dL 16  12  11    Creatinine 0.44 - 1.00 mg/dL 7.82  9.56  2.13   Sodium 135 - 145 mmol/L 137  140  137   Potassium 3.5 - 5.1 mmol/L 4.4  4.1  4.3   Chloride 98 - 111 mmol/L 104  107  104   CO2 22 -  32 mmol/L 28  28  29    Calcium 8.9 - 10.3 mg/dL 8.9  8.5  9.1   Total Protein 6.5 - 8.1 g/dL 7.1  7.0  6.7   Total Bilirubin 0.3 - 1.2 mg/dL 0.4  0.4  0.4   Alkaline Phos 38 - 126 U/L 80  94  91   AST 15 - 41 U/L 18  16  16    ALT 0 - 44 U/L 8  7  8        RADIOGRAPHIC STUDIES: I have personally reviewed the radiological images as listed and agreed with the findings in the report. No results found.    No orders of the defined types were placed in this encounter.  All questions were answered. The patient knows to call the clinic with any problems, questions or concerns. No barriers to learning was detected. The total time spent in the appointment was 45 minutes.     [name], MD 02/05/2023

## 2023-02-06 ENCOUNTER — Inpatient Hospital Stay: Payer: Federal, State, Local not specified - PPO

## 2023-02-06 ENCOUNTER — Telehealth: Payer: Self-pay | Admitting: Hematology

## 2023-02-06 ENCOUNTER — Inpatient Hospital Stay: Payer: Federal, State, Local not specified - PPO | Attending: Nurse Practitioner | Admitting: Hematology

## 2023-02-08 ENCOUNTER — Inpatient Hospital Stay: Payer: Federal, State, Local not specified - PPO

## 2023-02-11 ENCOUNTER — Encounter: Payer: Self-pay | Admitting: Physical Therapy

## 2023-02-11 ENCOUNTER — Ambulatory Visit: Payer: Federal, State, Local not specified - PPO | Admitting: Physical Therapy

## 2023-02-11 DIAGNOSIS — R262 Difficulty in walking, not elsewhere classified: Secondary | ICD-10-CM

## 2023-02-11 DIAGNOSIS — M6281 Muscle weakness (generalized): Secondary | ICD-10-CM

## 2023-02-11 DIAGNOSIS — R209 Unspecified disturbances of skin sensation: Secondary | ICD-10-CM

## 2023-02-11 DIAGNOSIS — C20 Malignant neoplasm of rectum: Secondary | ICD-10-CM

## 2023-02-11 NOTE — Therapy (Signed)
OUTPATIENT PHYSICAL THERAPY ONCOLOGY TREATMENT  Patient Name: Brittney Spencer MRN: [id number] DOB:Sep 28, 1950, 72 y.o., female Today's Date: 02/11/2023  END OF SESSION:  PT End of Session - 02/11/23 0906     Visit Number 10    Number of Visits 17    Date for PT Re-Evaluation 02/18/23    PT Start Time 0905    PT Stop Time 0951    PT Time Calculation (min) 46 min    Activity Tolerance Patient tolerated treatment well    Behavior During Therapy [facility] for tasks assessed/performed                Past Medical History:  Diagnosis Date   AAA (abdominal aortic aneurysm) (HCC)    infrarenal 4.1 cmper s-9-19 scan on chart   Anemia    hx of   Anxiety    has PRN meds   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 Some residual intraretinal fluid remains, very small localized subfoveal of fluid remains although this continues to slowly resorb. We'll continue to observe.   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   Resolved March 2021 post vitrectomy   Past Surgical History:  Procedure Laterality Date   COLONOSCOPY  2018   HD-hams   COLONSCOPY  12/2015   IR IMAGING GUIDED PORT INSERTION  08/04/2020   LAPAROSCOPIC RIGHT HEMI COLECTOMY Right 02/07/2016   Procedure: LAPAROSCOPIC ASSISTED RIGHT HEMI COLECTOMY AND RIGHT SALPINGO OOPHERECTOMY;  Surgeon: Glenna Fellows, MD;  Location: [facility];  Service: General;  Laterality: Right;   RETINAL DETACHMENT SURGERY  09/2019   Patient Active Problem List   Diagnosis Date Noted   Peripheral neuropathy due to chemotherapy (HCC) 11/14/2022   DNR (do not  resuscitate) 04/25/2022   Encounter for antineoplastic chemotherapy 11/29/2021   Macular hole of right eye 02/20/2021   Macular pucker, right eye 02/20/2021   Port-A-Cath in place 08/10/2020   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) 07/04/2020   History of vitrectomy 03/28/2020   Vitreomacular traction, left 03/28/2020   Nuclear sclerotic cataract of left eye 03/28/2020   Follow-up examination after eye surgery 10/21/2019   Essential hypertension 03/26/2016   Iron deficiency anemia due to chronic blood loss 03/26/2016   Cancer of ascending colon (HCC) 02/07/2016   Low hemoglobin 01/05/2016   Perforated appendicitis 09/15/2015    PCP: none  REFERRING PROVIDER: [name], MD  REFERRING DIAG: C20,C78.7 (ICD-10-CM) - Rectal cancer metastasized to liver [facility])  THERAPY DIAG:  Muscle weakness (generalized)  Unspecified disturbances of skin sensation  Difficulty in walking, not elsewhere classified  Rectal cancer metastasized to liver [facility])  ONSET DATE: 11/23/22  Rationale for Evaluation and Treatment: Rehabilitation  SUBJECTIVE:  SUBJECTIVE STATEMENT:  I have discomfort in my R thigh. I was doing some type of leg movement when it started.   PERTINENT HISTORY:  Cancer of ascending colon (HCC), diagnosed in 12/2015. S/p right hemicolectomy with right salpingo oophorectomy and adjuvant chemo and radiation.Diagnosed with rectal cancer with mets to the liver in 2021. her PET from 07/12/20 indicates soft tissue mass within the right iliac fossa is noted and consistent with local tumor recurrence from previous ascending colon tumor. Restaging on 10/07/22 showed overall stable disease with multiple small lung nodules. Currently on chemo and has neuropathy and anemia  PAIN:  Are you having pain? No not  presently,Discomfort in R thigh  PRECAUTIONS: Other: metastatic rectal cancer to liver  WEIGHT BEARING RESTRICTIONS: No  FALLS:  Has patient fallen in last 6 months? No  LIVING ENVIRONMENT: Lives with: lives alone Lives in: House/apartment Stairs: No;  Has following equipment at home: Grab bars  OCCUPATION: retired  LEISURE: pt intentionally walks  HAND DOMINANCE: right   PRIOR LEVEL OF FUNCTION: Independent  PATIENT GOALS: "to see if it offers any other suggestions as far as the body and what else I can do for the neuropathy"   OBJECTIVE:  COGNITION: Overall cognitive status: Within functional limits for tasks assessed    SENSATION: Tingling in bilateral feet and hands  POSTURE: forward head and rounded shoulders  UPPER EXTREMITY AROM/PROM: WFL  UPPER EXTREMITY STRENGTH: 5/5   LOWER EXTREMITY MMT:  MMT Right eval  Hip flexion 2+/5  Hip extension 2+/5  Hip abduction 2+/5  Hip adduction   Hip internal rotation   Hip external rotation   Knee flexion 3+/5  Knee extension 5/5  Ankle dorsiflexion 5/5  Ankle plantarflexion   Ankle inversion   Ankle eversion    (Blank rows = not tested)  MMT LEFT eval  Hip flexion 3+/5  Hip extension 2+/5  Hip abduction 3+/5  Hip adduction   Hip internal rotation   Hip external rotation   Knee flexion 4/5  Knee extension 5/5  Ankle dorsiflexion 5/5  Ankle plantarflexion   Ankle inversion   Ankle eversion    (Blank rows = not tested)     FUNCTIONAL TESTS:  30 seconds chair stand test 11 reps without use of UEs which is average for her age  67/2/24: [name] Balance Test Sit to Stand: Able to stand without using hands and stabilize independently Standing Unsupported: Able to stand safely 2 minutes Sitting with Back Unsupported but Feet Supported on Floor or Stool: Able to sit safely and securely 2 minutes Stand to Sit: Sits safely with minimal use of hands Transfers: Able to transfer safely, minor use of  hands Standing Unsupported with Eyes Closed: Able to stand 10 seconds safely Standing Ubsupported with Feet Together: Able to place feet together independently and stand 1 minute safely From Standing, Reach Forward with Outstretched Arm: Can reach forward >12 cm safely (5") From Standing Position, Pick up Object from Floor: Able to pick up shoe safely and easily From Standing Position, Turn to Look Behind Over each Shoulder: Turn sideways only but maintains balance Turn 360 Degrees: Able to turn 360 degrees safely but slowly Standing Unsupported, Alternately Place Feet on Step/Stool: Able to stand independently and safely and complete 8 steps in 20 seconds Standing Unsupported, One Foot in Front: Needs help to step but can hold 15 seconds Standing on One Leg: Tries to lift leg/unable to hold 3 seconds but remains standing independently Total Score: 45  GAIT: Distance walked: 25 feet Assistive device utilized: None Level of assistance: SBA Comments: foot flat, slightly unsteady appearing  SINGLE LIMB STANCE:  2 sec on R 3 sec on L  TODAY'S TREATMENT:                                                                                                                                         DATE:  02/11/2023 Nu step seat 8, UE 9, level 4 x 9 min 36 sec, 673 steps Bilateral piriformis stretch x 2 with 30 sec holds and v/c to not move during stretch Bilateral hamstring stretch in sitting x 30 sec holds Hip flexion x 10 bilaterally with 1 HHA on //bar, x 10 B  Right hip flexor stretch standing x 3 with v/c for form and pt return demo Standing heel raises x 10 no HH SLS x 5 ea; spent time practicing wt shifting to get pt used to shifting wt entirely over stance leg before raising opposite foot with increased difficult on the R side 8 inch step ups with emphasis placed on controlled descent x 5 bilaterally Rolling to IT band: L sidelying to R IT band x 5 min followed by gentle STM to R IT band -  pt reported decreased discomfort after this L sidelying R IT band stretch with therapist assist to drape leg off edge of mat 02/04/2023 Nu step seat 8, UE 9, level 4 x 8 min, 528 steps Bilateral piriformis stretch x 2  Hip flexion x 10 bilaterally with hand hold, x 10 B no HH Right hip flexor stretch standing x 3 Standing heel raises x 15 no HH SLS x 5 ea; spent time practicing wt shifting to get pt used to shifting wt entirely over stance leg before raising opposite foot 8 in partial step up x 10 emphasis on lowering slowly B, 8 in foot placement Bilaterally x 5 no hands 8 in lateral foot placement x 10 B no HH 01/23/2023 Nu step seat 8, UE 9, level 3 x 8 min 629 steps Hip flexion 2 x10 B, marching x 10 alternating with HH Standing heel raises no HH x 15 6 in step ups 1 x 10, light hold on bars Foot placement forward on step left x 10, right x 10 6 in step ups repeated x 10 ea Foot placement laterally on step x 10 ea Incline stretch at bars 3 x 20 sec Ax beam x 3 laps forward and 2 laps sideways Bridging 2 x 5 coordinating breathing Right hip fle    01/20/2023 Nu step seat 8, UE 9 level 3 x 8 min, 530 steps Marching no HH x 10 Marching knee and foot in front 2 x 5 with HH Standing heel raises no HH x 15 6 in step ups 2 x 10, light hold on bars Standing knee flexion x 10 Standing hip abd and flexion with  SLR 1# x 10 ea, then x 5 ea Ax beam 3 laps forward and 3 laps sidestepping with intermittent hand touch Supine bridge with breathing focus 2 x 5 Right hip flexor stretch supine with CR stretch x 3, repeated on left.  Quad sets into towel roll at heel x 10 01/15/2023 Nu step seat 8, UE 9 level 3 x 8 min 472 steps Seated hamstring stretch pt returned therapist demo 30 sec holds bilaterally x 2- more tight on R side Piriformis stretch x 30 sec holds bilaterally SLR x 10 reps bilaterally with v/c to engage core and to coordinate it with diaphragmatic breathing Bridging with pt  returning therapist demo x 10 with v/c to keep core engaged and to coordinate with diaphragmatic breathing Gentle suction cupping with silicone cups to gently  mobilize skin and fascia - initially with no cocoa butter and then added cocoa butter to be able to move cups  01/13/2023 Nu step seat 8, UE 9 level 2 x 7 min 330 steps Seated hamstring stretch pt returned therapist demo 30 sec holds bilaterally x 2- more tight on R side Piriformis stretch x 30 sec holds bilaterally Supine on mat being mindful not to put any weight on port - therapist placing gentle pressure over hip to extend hip with knee bent to 90 and therapist pulling lower leg back for hip flexor stretch x 3 with 1 min holds SLR x 10 reps bilaterally with increased difficulty on R side Bridging with pt returning therapist demo x 10 with v/c to keep core engaged Gentle MFR to R lower abdominal area where pt has discomfort - gently to mobilize skin and fascia  01/08/2023 Nu step seat 7, UE 9 level 3 x 7 min 324 steps Seated hamstring stretch pt returned therapist demo 30 sec holds bilaterally x 2- more tight on R side Standing quad stretch in // bars with chair behind x 60 sec holds x 2 bilaterally with pt more tight on R side Standing march 2 x 10 intermittent hand hold Heel raises in // bars occasional HH x 15 with increased difficulty keeping weight through entire foot  Bilateral gastroc stretches 1 x 60 sec with runners stretch Step onto ax forward x 10 ea, lateral x 10 ea LAQ 3 # x 10 ea Airex beam forward x 3 laps, backwards x 3 laps intermittent HH, sidestepping on airex x 3 each with increased difficulty maintaining balance on beam Tandem stance x 3 ea with intermittent HH Single limb stance x 3 each with HHA most of the time Instructed pt in mini squats and pt returned therapist demo x 10   01/06/2023 Nu step seat 8, UE 9 lev 2-3 x 5 min 225 steps Standing march 2 x 10 intermittent hand hold Heel raises no HH x  15 Bilateral gastroc stretches 2 x 20 sec with runners stretch Step onto ax forward x 10 ea, lateral x 10 ea LAQ 3 # x 10 ea Seated HS stretch x 3 ea Airex beam forward x 3 laps, backwards x 3 laps intermittent HH Tandem stance x 3 ea with intermittent HH  12/31/22:  Performed Berg Balance test - see above pt scored a 45/50, s he had difficulty with balance with turning quickly and reaching forward with feet planted Instructed pt in seated hamstring stretch in sitting- pt required max v/c and t/c cues for this to avoid scapular protraction, twisting, keeping knee straight and foot pulled back with 20 sec holds - pt  felt tight bilaterally Instructed in quad stretch with chair behind her at the counter with pt able to hold about 10 sec with good stretch felt bilaterally Tandem stance in the corner and educated pt to do this in the kitchen facing the corner with counter on either side for support  Created HEP  12/24/22: quad stretch in standing with HHA at counter  PATIENT EDUCATION:  Education details:hamstring stretch without protraction of scapula, quad stretch with chair for leg support, tandem stance in corner Person educated: Patient Education method: Explanation Education comprehension: verbalized understanding  HOME EXERCISE PROGRAM: Sit to stands without use of UEs Access Code: [email or web address] URL: [email or web address] Date: 12/31/2022 Prepared by: Leonette Most  Exercises - Seated Hamstring Stretch  - 1 x daily - 7 x weekly - 1 sets - 3-5 reps - work up to 30 sec hold - Theatre manager with Chair and Counter Support  - 1 x daily - 7 x weekly - 1 sets - 3-5 reps - work up to 30 sec  hold - Standing Tandem Balance with Counter Support  - 1 x daily - 7 x weekly - 1 sets - 5-10 reps - work up to 30 sec  hold  ASSESSMENT:  CLINICAL IMPRESSION: Pt has been having increased discomfort along R IT band. Began rolling to R IT band to help decrease discomfort and it  was initially very uncomfortable but improved by end of session. Educated pt she can practice using a rolling pin at home to lightly roll IT band. Pt still requiring cueing for proper form and for controlled movement during strength and stretching exercises.  . OBJECTIVE IMPAIRMENTS: decreased activity tolerance, decreased balance, difficulty walking, decreased strength, increased fascial restrictions, impaired flexibility, impaired sensation, and postural dysfunction.   ACTIVITY LIMITATIONS: stairs and locomotion level  PARTICIPATION LIMITATIONS: cleaning, driving, and community activity  PERSONAL FACTORS: Age, Fitness, and Time since onset of injury/illness/exacerbation are also affecting patient's functional outcome.   REHAB POTENTIAL: Good  CLINICAL DECISION MAKING: Stable/uncomplicated  EVALUATION COMPLEXITY: Low  GOALS: Goals reviewed with patient? Yes  SHORT TERM GOALS: Target date: 01/21/23  Pt will be able to do SLS bilaterally for 5 sec each to decrease fall risk. Baseline: Goal status: INITIAL  2.  Pt will be independent with initial HEP for stretching and strengthening.  Baseline:  Goal status: MET  02/04/2023 3.  Pt will be able to ascend/descend the curb without difficulty to allow improved mobility.  Baseline:  Goal status: INITIAL  4.  Pt will demonstrate 3/5 R hip flexor strength to decrease fall risk.  Baseline:  Goal status: INITIAL   LONG TERM GOALS: Target date: 02/18/23  Pt will demonstrate 3+/5 hip flexor strength on the R to decrease fall risk.  Baseline:  Goal status: INITIAL  2.  Pt will be able to get in and out of the car without manually lifting her R leg. Baseline:  Goal status: INITIAL  3.  Pt will be able to ascend/descend a flight of steps with out assistance or fear of falling.  Baseline:  Goal status:MET 02/04/2023 using rail 4.  Pt will demonstrate 3/5 bilateral hip extensor strength to decrease fall risk.  Baseline:  Goal status:  INITIAL  5.  Pt will be independent in a home exercise program for continued stretching and strengthening.  Baseline:  Goal status: INITIAL   PLAN:  PT FREQUENCY: 2x/week  PT DURATION: 8 weeks  PLANNED INTERVENTIONS: Therapeutic exercises, Therapeutic activity, Neuromuscular re-education, Balance training, Gait training,  Patient/Family education, Self Care, Joint mobilization, Stair training, and Manual therapy  PLAN FOR NEXT SESSION:  NuStep, LE strengthening, balance exercises   [employer], PT 02/11/2023, 9:55 AM

## 2023-02-12 ENCOUNTER — Other Ambulatory Visit: Payer: Self-pay

## 2023-02-12 ENCOUNTER — Inpatient Hospital Stay: Payer: Federal, State, Local not specified - PPO

## 2023-02-12 ENCOUNTER — Inpatient Hospital Stay: Payer: Federal, State, Local not specified - PPO | Attending: Nurse Practitioner

## 2023-02-12 ENCOUNTER — Inpatient Hospital Stay (HOSPITAL_BASED_OUTPATIENT_CLINIC_OR_DEPARTMENT_OTHER): Payer: Federal, State, Local not specified - PPO | Admitting: Nurse Practitioner

## 2023-02-12 ENCOUNTER — Encounter: Payer: Self-pay | Admitting: Nurse Practitioner

## 2023-02-12 VITALS — BP 148/79 | HR 82

## 2023-02-12 DIAGNOSIS — Z79899 Other long term (current) drug therapy: Secondary | ICD-10-CM | POA: Diagnosis not present

## 2023-02-12 DIAGNOSIS — Z66 Do not resuscitate: Secondary | ICD-10-CM | POA: Diagnosis not present

## 2023-02-12 DIAGNOSIS — C787 Secondary malignant neoplasm of liver and intrahepatic bile duct: Secondary | ICD-10-CM | POA: Insufficient documentation

## 2023-02-12 DIAGNOSIS — F419 Anxiety disorder, unspecified: Secondary | ICD-10-CM | POA: Diagnosis not present

## 2023-02-12 DIAGNOSIS — Z923 Personal history of irradiation: Secondary | ICD-10-CM | POA: Diagnosis not present

## 2023-02-12 DIAGNOSIS — G620 Drug-induced polyneuropathy: Secondary | ICD-10-CM | POA: Insufficient documentation

## 2023-02-12 DIAGNOSIS — T451X5D Adverse effect of antineoplastic and immunosuppressive drugs, subsequent encounter: Secondary | ICD-10-CM | POA: Diagnosis not present

## 2023-02-12 DIAGNOSIS — D500 Iron deficiency anemia secondary to blood loss (chronic): Secondary | ICD-10-CM

## 2023-02-12 DIAGNOSIS — Z95828 Presence of other vascular implants and grafts: Secondary | ICD-10-CM

## 2023-02-12 DIAGNOSIS — C20 Malignant neoplasm of rectum: Secondary | ICD-10-CM

## 2023-02-12 DIAGNOSIS — I10 Essential (primary) hypertension: Secondary | ICD-10-CM | POA: Insufficient documentation

## 2023-02-12 DIAGNOSIS — Z5111 Encounter for antineoplastic chemotherapy: Secondary | ICD-10-CM | POA: Insufficient documentation

## 2023-02-12 DIAGNOSIS — Z5112 Encounter for antineoplastic immunotherapy: Secondary | ICD-10-CM | POA: Insufficient documentation

## 2023-02-12 DIAGNOSIS — Z1509 Genetic susceptibility to other malignant neoplasm: Secondary | ICD-10-CM | POA: Insufficient documentation

## 2023-02-12 DIAGNOSIS — Z9221 Personal history of antineoplastic chemotherapy: Secondary | ICD-10-CM | POA: Diagnosis not present

## 2023-02-12 DIAGNOSIS — Z5189 Encounter for other specified aftercare: Secondary | ICD-10-CM | POA: Diagnosis not present

## 2023-02-12 DIAGNOSIS — R911 Solitary pulmonary nodule: Secondary | ICD-10-CM | POA: Diagnosis not present

## 2023-02-12 DIAGNOSIS — Z9049 Acquired absence of other specified parts of digestive tract: Secondary | ICD-10-CM | POA: Insufficient documentation

## 2023-02-12 LAB — CMP (CANCER CENTER ONLY)
ALT: 10 U/L (ref 0–44)
AST: 21 U/L (ref 15–41)
Albumin: 3.5 g/dL (ref 3.5–5.0)
Alkaline Phosphatase: 87 U/L (ref 38–126)
Anion gap: 3 — ABNORMAL LOW (ref 5–15)
BUN: 14 mg/dL (ref 8–23)
CO2: 30 mmol/L (ref 22–32)
Calcium: 8.5 mg/dL — ABNORMAL LOW (ref 8.9–10.3)
Chloride: 105 mmol/L (ref 98–111)
Creatinine: 0.67 mg/dL (ref 0.44–1.00)
GFR, Estimated: >60 mL/min (ref >60)
Glucose, Bld: 92 mg/dL (ref 70–99)
Potassium: 4.2 mmol/L (ref 3.5–5.1)
Sodium: 138 mmol/L (ref 135–145)
Total Bilirubin: 0.4 mg/dL (ref 0.3–1.2)
Total Protein: 7 g/dL (ref 6.5–8.1)

## 2023-02-12 LAB — CBC WITH DIFFERENTIAL (CANCER CENTER ONLY)
Abs Immature Granulocytes: 0 10*3/uL (ref 0.00–0.07)
Basophils Absolute: 0 10*3/uL (ref 0.0–0.1)
Basophils Relative: 1 %
Eosinophils Absolute: 0.1 10*3/uL (ref 0.0–0.5)
Eosinophils Relative: 3 %
HCT: 32.2 % — ABNORMAL LOW (ref 36.0–46.0)
Hemoglobin: 10.2 g/dL — ABNORMAL LOW (ref 12.0–15.0)
Immature Granulocytes: 0 %
Lymphocytes Relative: 35 %
Lymphs Abs: 1 10*3/uL (ref 0.7–4.0)
MCH: 29.2 pg (ref 26.0–34.0)
MCHC: 31.7 g/dL (ref 30.0–36.0)
MCV: 92.3 fL (ref 80.0–100.0)
Monocytes Absolute: 0.3 10*3/uL (ref 0.1–1.0)
Monocytes Relative: 12 %
Neutro Abs: 1.4 10*3/uL — ABNORMAL LOW (ref 1.7–7.7)
Neutrophils Relative %: 49 %
Platelet Count: 109 10*3/uL — ABNORMAL LOW (ref 150–400)
RBC: 3.49 MIL/uL — ABNORMAL LOW (ref 3.87–5.11)
RDW: 13.1 % (ref 11.5–15.5)
WBC Count: 2.7 10*3/uL — ABNORMAL LOW (ref 4.0–10.5)
nRBC: 0 % (ref 0.0–0.2)

## 2023-02-12 LAB — TOTAL PROTEIN, URINE DIPSTICK: Protein, ur: NEGATIVE mg/dL

## 2023-02-12 LAB — CEA (IN HOUSE-CHCC): CEA (CHCC-In House): 101.59 ng/mL — ABNORMAL HIGH (ref 0.00–5.00)

## 2023-02-12 LAB — FERRITIN: Ferritin: 59 ng/mL (ref 11–307)

## 2023-02-12 MED ORDER — SODIUM CHLORIDE 0.9 % IV SOLN: Freq: Once | INTRAVENOUS | Status: AC

## 2023-02-12 MED ORDER — OXALIPLATIN CHEMO INJECTION 100 MG/20ML
50.0000 mg/m2 | Freq: Once | INTRAVENOUS | Status: AC
  Administered 2023-02-12: 75 mg via INTRAVENOUS
  Filled 2023-02-12: qty 5.36

## 2023-02-12 MED ORDER — GABAPENTIN 100 MG PO CAPS: 100.0000 mg | ORAL_CAPSULE | Freq: Every day | ORAL | 1 refills | Status: DC

## 2023-02-12 MED ORDER — LEUCOVORIN CALCIUM INJECTION 350 MG
400.0000 mg/m2 | Freq: Once | INTRAVENOUS | Status: AC
  Administered 2023-02-12: 600 mg via INTRAVENOUS
  Filled 2023-02-12: qty 30

## 2023-02-12 MED ORDER — PALONOSETRON HCL INJECTION 0.25 MG/5ML
0.2500 mg | Freq: Once | INTRAVENOUS | Status: AC
  Administered 2023-02-12: 0.25 mg via INTRAVENOUS
  Filled 2023-02-12: qty 5

## 2023-02-12 MED ORDER — FAMOTIDINE IN NACL 20-0.9 MG/50ML-% IV SOLN
20.0000 mg | Freq: Once | INTRAVENOUS | Status: AC
  Administered 2023-02-12: 20 mg via INTRAVENOUS
  Filled 2023-02-12: qty 50

## 2023-02-12 MED ORDER — SODIUM CHLORIDE 0.9% FLUSH: 10.0000 mL | INTRAVENOUS | Status: DC | PRN

## 2023-02-12 MED ORDER — SODIUM CHLORIDE 0.9 % IV SOLN
10.0000 mg | Freq: Once | INTRAVENOUS | Status: AC
  Administered 2023-02-12: 10 mg via INTRAVENOUS
  Filled 2023-02-12: qty 10

## 2023-02-12 MED ORDER — SODIUM CHLORIDE 0.9 % IV SOLN
5.0000 mg/kg | Freq: Once | INTRAVENOUS | Status: AC
  Administered 2023-02-12: 250 mg via INTRAVENOUS
  Filled 2023-02-12: qty 10

## 2023-02-12 MED ORDER — SODIUM CHLORIDE 0.9% FLUSH
10.0000 mL | Freq: Once | INTRAVENOUS | Status: AC | PRN
  Administered 2023-02-12: 10 mL

## 2023-02-12 MED ORDER — DEXTROSE 5 % IV SOLN: Freq: Once | INTRAVENOUS | Status: AC

## 2023-02-12 MED ORDER — SODIUM CHLORIDE 0.9 % IV SOLN
1800.0000 mg/m2 | INTRAVENOUS | Status: DC
  Administered 2023-02-12: 2500 mg via INTRAVENOUS
  Filled 2023-02-12: qty 50

## 2023-02-12 MED ORDER — HEPARIN SOD (PORK) LOCK FLUSH 100 UNIT/ML IV SOLN: 500.0000 [IU] | Freq: Once | INTRAVENOUS | Status: DC | PRN

## 2023-02-12 MED ORDER — LORATADINE 10 MG PO TABS
10.0000 mg | ORAL_TABLET | Freq: Every day | ORAL | Status: DC
  Administered 2023-02-12: 10 mg via ORAL
  Filled 2023-02-12: qty 1

## 2023-02-12 NOTE — Patient Instructions (Signed)
[place] [facility], [facility], [facility]  Discharge Instructions: Thank you for choosing [facility] to provide your oncology and hematology care.   If you have a lab appointment with the Cancer Center, please go directly to the Cancer Center and check in at the registration area.   Wear comfortable clothing and clothing appropriate for easy access to any Portacath or PICC line.   We strive to give you quality time with your provider. You may need to reschedule your appointment if you arrive late (15 or more minutes).  Arriving late affects you and other patients whose appointments are after yours.  Also, if you miss three or more appointments without notifying the office, you may be dismissed from the clinic at the provider's discretion.      For prescription refill requests, have your pharmacy contact our office and allow 72 hours for refills to be completed.    Today you received the following chemotherapy and/or immunotherapy agents: Bevacizumab, Oxaliplatin, Leucovorin, 5FU      To help prevent nausea and vomiting after your treatment, we encourage you to take your nausea medication as directed.  BELOW ARE SYMPTOMS THAT SHOULD BE REPORTED IMMEDIATELY: *FEVER GREATER THAN 100.4 F (38 C) OR HIGHER *CHILLS OR SWEATING *NAUSEA AND VOMITING THAT IS NOT CONTROLLED WITH YOUR NAUSEA MEDICATION *UNUSUAL SHORTNESS OF BREATH *UNUSUAL BRUISING OR BLEEDING *URINARY PROBLEMS (pain or burning when urinating, or frequent urination) *BOWEL PROBLEMS (unusual diarrhea, constipation, pain near the anus) TENDERNESS IN MOUTH AND THROAT WITH OR WITHOUT PRESENCE OF ULCERS (sore throat, sores in mouth, or a toothache) UNUSUAL RASH, SWELLING OR PAIN  UNUSUAL VAGINAL DISCHARGE OR ITCHING   Items with * indicate a potential emergency and should be followed up as soon as possible or go to the Emergency Department if any problems should occur.  Please show the CHEMOTHERAPY ALERT CARD or  IMMUNOTHERAPY ALERT CARD at check-in to the Emergency Department and triage nurse.  Should you have questions after your visit or need to cancel or reschedule your appointment, please contact [place] [facility]  Dept: [phone]  and follow the prompts.  Office hours are 8:00 a.m. to 4:30 p.m. Monday - Friday. Please note that voicemails left after 4:00 p.m. may not be returned until the following business day.  We are closed weekends and major holidays. You have access to a nurse at all times for urgent questions. Please call the main number to the clinic Dept: [phone] and follow the prompts.   For any non-urgent questions, you may also contact your provider using MyChart. We now offer e-Visits for anyone 61 and older to request care online for non-urgent symptoms. For details visit [email or web address].   Also download the MyChart app! Go to the app store, search "MyChart", open the app, select , and log in with your MyChart username and password.

## 2023-02-12 NOTE — Progress Notes (Signed)
Patient Care Team: Patient, No Pcp Per as PCP - General (General Practice) [name], NP as Nurse Practitioner (Oncology) [name], MD as Consulting Physician (Oncology) [name], RN (Inactive) as Oncology Nurse Navigator   CHIEF COMPLAINT: Follow up metastatic rectal cancer and h/o colon cancer   Oncology History Overview Note  Cancer Staging Cancer of ascending colon [facility]) Staging form: Colon and Rectum, AJCC 7th Edition - Clinical stage from 02/07/2016: Stage IIIC (T4b, N1b, M0) - Signed by [name], MD on 03/04/2016 Laterality: Right Residual tumor (R): R2 - Macroscopic    Cancer of ascending colon (HCC)  10/25/2015 Imaging   CT ABD/PELVIS:  Inflammatory changes inferior to the cecal tip appear improved, there is still irregular soft tissue thickening of the cecal tip, and there are adjacent prominent lymph nodes in the ileocolonic mesentery, measuring 13 mm on image 49 and 8 mm on image 52. In addition, there is a 2.5 x 1.8 cm nodule on image 46 which has central low density. Therefore, these findings are moderately suspicious for an underlying cecal malignancy with perforation.    01/19/2016 Procedure   COLONOSCOPY per Dr. [name]: Fungating, ulcerated mass almost obstructing mid ascending colon   01/19/2016 Initial Biopsy   Diagnosis Surgical [P], cecal mass - INVASIVE ADENOCARCINOMA WITH ULCERATION. - SEE COMMENT.   02/05/2016 Tumor Marker   Patient's tumor was tested for the following markers: CEA Results of the tumor marker test revealed 5.7.   02/07/2016 Initial Diagnosis   Cancer of ascending colon (HCC)   02/07/2016 Definitive Surgery   Laparoscopic assisted right hemicolectomy and right salpingo oopherectomy--[name]   02/07/2016 Pathologic Stage   p T4 N1b   2/43 nodes +   02/07/2016 Pathology Results   MMR normal; G2 adenocarcinoma;proximal & distal margins negative; soft tissue mass on pelvic sidewall + for adenocarcinoma with positive  margin MSI Stable   03/08/2016 Imaging   CT chest negative for metastasis.    03/19/2016 - 04/25/2016 Radiation Therapy   Adjuvant irradiation, 50 gray in 28 fractions   03/19/2016 - 04/22/2016 Chemotherapy   Xeloda 1500 mg twice daily, started on 03/19/2016, dose reduced to 1000 mg twice daily from week 3 due to neutropenia, and patient stopped 3 days before last dose radiation due to difficulty swallowing the pill    05/20/2016 -  Adjuvant Chemotherapy   Patient declined adjuvant chemotherapy   09/16/2016 Imaging   CT CAP w Contrast 1. No evidence of local tumor recurrence at the ileocolic anastomosis. 2. No findings suspicious for metastatic disease in the chest, abdomen or pelvis. 3. Nonspecific trace free fluid in the pelvic cul-de-sac. 4. Stable solitary 3 mm right upper lobe pulmonary nodule, for which 6 month stability has been demonstrated, probably benign. 5. Additional findings include stable right posterior pericardial cyst and small calcified uterine fibroids.   05/13/2017 Imaging   CT CAP W Contrast 05/13/17 IMPRESSION: 1. No current findings of residual or recurrent malignancy. 2. Mild prominence of stool throughout the colon. Nondistended portions of the rectum. 3. Several tiny pulmonary nodules are stable from the earliest available comparison of 03/08/2016 and probably benign, but may merit surveillance. 4. Other imaging findings of potential clinical significance: Old granulomatous disease. Aortoiliac atherosclerotic vascular disease. Lumbar spondylosis and degenerative disc disease. Stable amount of trace free pelvic fluid.   04/27/2018 Imaging   04/27/2018 CT CAP IMPRESSION: Stable exam. No evidence of recurrent or metastatic carcinoma within the chest, abdomen, or pelvis   04/19/2019  Imaging   CT CAP W Contrast  IMPRESSION: Chest Impression:   1. No evidence of thoracic metastasis. 2. Stable small bilateral pulmonary nodules.   Abdomen / Pelvis  Impression:   1. No evidence local colorectal carcinoma recurrence or metastasis in the abdomen pelvis. 2. Post RIGHT hemicolectomy.   01/22/2021 Imaging   CT CAP  IMPRESSION: CT CHEST IMPRESSION   1. Similar nonspecific pulmonary nodules. 2. New posterior left upper lobe reticulonodular opacity, suspicious for interval mild infection or inflammation. 3. No thoracic adenopathy.   CT ABDOMEN AND PELVIS IMPRESSION   1. Further decrease in size of high left hepatic lobe 3 mm low-density lesion. No new or progressive metastatic disease within the abdomen or pelvis. 2. Similar trace free pelvic fluid. 3. Similar nonspecific mid rectal wall thickening. 4.  Aortic Atherosclerosis (ICD10-I70.0).   04/23/2021 Imaging   CT CAP  IMPRESSION: 1. Treated metastatic lesion between segments 2 and 3 of the liver, slightly smaller and less distinct than prior examination. No other signs of definite metastatic disease elsewhere in the abdomen or pelvis. 2. Multiple small pulmonary nodules, stable compared to the prior examination, favored to be benign. No definitive findings to suggest metastatic disease to the thorax. 3. Aortic atherosclerosis. 4. Additional incidental findings, as above.   08/13/2021 Imaging   EXAM: CT CHEST, ABDOMEN, AND PELVIS WITH CONTRAST  IMPRESSION: 1. A previously seen PET avid lesion of the anterior left lobe of the liver, hepatic segment II, is no longer discretely appreciable consistent with treatment response of a hepatic metastasis. 2. No evidence of new metastatic disease in the chest, abdomen, or pelvis. 3. Interval increase in a small focus of consolidation and nodularity of the medial left upper lobe, consistent with minimal, ongoing atypical infection. Additional tiny bilateral pulmonary nodules are stable and almost certainly incidental benign. Attention on follow-up. 4. Status post right hemicolectomy and ileocolic anastomosis.   07/02/2022  Imaging    IMPRESSION: CHEST IMPRESSION:   1. No evidence of thoracic metastasis. 2. Stable small pulmonary nodules.   PELVIS IMPRESSION:   1. Stable to slight decrease in size of subcapsular lesion in the RIGHT hepatic lobe. 2. No evidence of new or progressive disease in the abdomen pelvis. 3.  Aortic Atherosclerosis (ICD10-I70.0).     12/31/2022 Imaging    IMPRESSION: 1. Right hepatic lobe metastasis has undergone mild-to-moderate enlargement since 10/07/2022. A left hepatic lobe 5 mm lesion is not readily apparent on the prior, suspicious for a new metastasis. 2. Similar nonspecific tiny pulmonary nodules. 3. Similar amorphous soft tissue thickening within the posterior superior right hemipelvis, hypermetabolic on prior PET and indeterminate for residual disease versus scarring. 4. Similar equivocal soft tissue fullness within the rectum. 5. Increased size of an abdominopelvic ventral wall nodule for which subcutaneous metastasis are a concern. 6. New trace right pelvic fluid. 7. Incidental findings, including: Aortic Atherosclerosis (ICD10-I70.0). 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 - Patent side-to-side ileo-colonic anastomosis, characterized by healthy appearing mucosa. - The examined portion of the ileum was normal. - One diminutive polyp in the proximal transverse colon, removed with a cold biopsy forceps. Resected and retrieved. - Likely malignant partially obstructing tumor in the mid rectum. Biopsied. Tattooed. - The examination was otherwise normal on direct and  retroflexion views.   06/15/2020 Initial Biopsy   Diagnosis 1. Transverse Colon Polyp - HYPERPLASTIC POLYP 2. Rectum,  biopsy - ADENOCARCINOMA ARISING IN A TUBULAR ADENOMA WITH HIGH-GRADE DYSPLASIA. SEE NOTE Diagnosis Note 2. Dr. Luisa Hart reviewed the case and concurs with the diagnosis. Dr. [name] was notified on 06/16/2020.   06/28/2020 Imaging   CT CAP  IMPRESSION: 1. New low-density focus in the anterior aspect of the lateral segment LEFT hepatic lobe measuring 1.2 x 1.0 cm, compatible with small metastatic lesion in the LEFT hepatic lobe. 2. Soft tissue in the RIGHT iliac fossa following RIGHT hemicolectomy invades the psoas musculature and is slowly enlarging over time, more linear on the prior study now highly concerning for recurrence/metastasis to this location. 3. Signs of enteritis, potentially post radiation changes of the small bowel. Tethered small bowel in the RIGHT lower quadrant shows focal thickening and narrowing suspicious for small bowel involvement and developing partial obstruction though currently contrast passes beyond this point into the colon. 4. Rectal thickening in this patient with known rectal mass as described. 5. No evidence of metastatic disease in the chest. 6. Stable small pulmonary nodules. 7.  and aortic atherosclerosis.   Aortic Atherosclerosis (ICD10-I70.0) and Emphysema (ICD10-J43.9).   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 Exam positive for FDG avid rectal tumor which corresponds to the recent colonoscopy findings. 2. FDG avid soft tissue mass within the right iliac fossa is noted and consistent with local tumor recurrence from previous ascending colon tumor. 3. Lateral segment left lobe of liver lesion is FDG avid concerning for liver metastasis. 4. No specific findings identified to suggest metastatic disease to the chest.   08/10/2020 -  Chemotherapy   First-line FOLFIRI q2weeks starting 08/10/20. dose reduced with cycle 1. Irinotecan/5FU increased and Bevacizumab added with  cycle 2 on 08/23/2020    08/17/2020 - 03/09/2022 Chemotherapy   Patient is on Treatment Plan : COLORECTAL FOLFIRI + Bevacizumab q14d     10/27/2020 Imaging   CT CAP  IMPRESSION: 1. Interval decrease in size of the hypermetabolic left hepatic lesion, consistent with metastatic disease. No new liver lesion evident. 2. Interval resolution of the hypermetabolic soft tissue lesion along the right iliac fossa with no measurable soft tissue lesion remaining at this location today. 3. Similar appearance of soft tissue fullness in the rectum at the site of the hypermetabolic lesion seen previously. 4. Stable tiny bilateral pulmonary nodules. Continued attention on follow-up recommended. 5. Small volume free fluid in the pelvis. 6. Aortic Atherosclerosis (ICD10-I70.0).   01/22/2021 Imaging   CT CAP  IMPRESSION: CT CHEST IMPRESSION   1. Similar nonspecific pulmonary nodules. 2. New posterior left upper lobe reticulonodular opacity, suspicious for interval mild infection or inflammation. 3. No thoracic adenopathy.   CT ABDOMEN AND PELVIS IMPRESSION   1. Further decrease in size of high left hepatic lobe 3 mm low-density lesion. No new or progressive metastatic disease within the abdomen or pelvis. 2. Similar trace free pelvic fluid. 3. Similar nonspecific mid rectal wall thickening. 4.  Aortic Atherosclerosis (ICD10-I70.0).   04/23/2021 Imaging   CT CAP  IMPRESSION: 1. Treated metastatic lesion between segments 2 and 3 of the liver, slightly smaller and less distinct than prior examination. No other signs of definite metastatic disease elsewhere in the abdomen or pelvis. 2. Multiple small pulmonary nodules, stable compared to the prior examination, favored to be benign. No definitive findings to suggest metastatic  disease to the thorax. 3. Aortic atherosclerosis. 4. Additional incidental findings, as above.   08/13/2021 Imaging   EXAM: CT CHEST, ABDOMEN, AND PELVIS WITH  CONTRAST  IMPRESSION: 1. A previously seen PET avid lesion of the anterior left lobe of the liver, hepatic segment II, is no longer discretely appreciable consistent with treatment response of a hepatic metastasis. 2. No evidence of new metastatic disease in the chest, abdomen, or pelvis. 3. Interval increase in a small focus of consolidation and nodularity of the medial left upper lobe, consistent with minimal, ongoing atypical infection. Additional tiny bilateral pulmonary nodules are stable and almost certainly incidental benign. Attention on follow-up. 4. Status post right hemicolectomy and ileocolic anastomosis.   03/28/2022 -  Chemotherapy   Patient is on Treatment Plan : COLORECTAL FOLFOX + Bevacizumab q14d     07/02/2022 Imaging    IMPRESSION: CHEST IMPRESSION:   1. No evidence of thoracic metastasis. 2. Stable small pulmonary nodules.   PELVIS IMPRESSION:   1. Stable to slight decrease in size of subcapsular lesion in the RIGHT hepatic lobe. 2. No evidence of new or progressive disease in the abdomen pelvis.   10/07/2022 Imaging    IMPRESSION: 1. Stable hypovascular mass in the periphery of the right lobe of the liver, which likely represents a treated metastatic lesion. Stable subcentimeter lesion just medial to this is also similar to the recent prior examination. No new hepatic lesions are otherwise noted. 2. Persistent but stable poorly defined soft tissue thickening in the right lower quadrant associated with multiple small bowel loops and the overlying iliopsoas musculature, which corresponds to focal hypermetabolism on remote prior PET-CT. Given the stability, this likely represents a treated metastatic lesion. Continued attention on follow-up studies is recommended. 3. Persistent mass-like thickening in the proximal rectum corresponding to previously noted hypermetabolic rectal neoplasm on prior PET-CT. This currently measures approximately 2.8 x 2.3 cm  and is slightly more apparent than the most recent prior study. 4. Multiple small pulmonary nodules generally stable compared to the prior study, with exception of a new branching nodule in the anterior aspect of the left upper lobe measuring 7 x 3 mm (mean diameter 5 mm). This is nonspecific. Close attention on follow-up studies is recommended to ensure stability. 5. Aortic atherosclerosis. 6. Additional incidental findings, as above.     12/31/2022 Imaging    IMPRESSION: 1. Right hepatic lobe metastasis has undergone mild-to-moderate enlargement since 10/07/2022. A left hepatic lobe 5 mm lesion is not readily apparent on the prior, suspicious for a new metastasis. 2. Similar nonspecific tiny pulmonary nodules. 3. Similar amorphous soft tissue thickening within the posterior superior right hemipelvis, hypermetabolic on prior PET and indeterminate for residual disease versus scarring. 4. Similar equivocal soft tissue fullness within the rectum. 5. Increased size of an abdominopelvic ventral wall nodule for which subcutaneous metastasis are a concern. 6. New trace right pelvic fluid. 7. Incidental findings, including: Aortic Atherosclerosis (ICD10-I70.0). Possible constipation      CURRENT THERAPY: FOLFOX with bevacizumab, q. 14 days starting 03/28/2022; Oxali dose reduced and eventually held from C15 (10/31/22), restarted 01/01/2023 due to disease progression   INTERVAL HISTORY Ms. Mauzy returns for follow-up as scheduled, last seen by Dr. Mosetta Putt 01/01/2023 to restart FOLFOX/Beva.  She has persistent tingling in her fingertips and toes which is constant, but not painful or limiting function at this point.  She is working with PT on this among other things. Eating and drinking well, bowels moving.  Denies pain, N/V, fever, chills,  or any other new/specific complaints.  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 Some residual intraretinal fluid remains, very small localized subfoveal of fluid remains although this continues to slowly resorb. We'll continue to observe.   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   Resolved March 2021 post vitrectomy     Past Surgical History:  Procedure Laterality Date   COLONOSCOPY  2018   [facility]   COLONSCOPY  12/2015   IR IMAGING GUIDED PORT INSERTION  08/04/2020   LAPAROSCOPIC RIGHT HEMI COLECTOMY Right 02/07/2016   Procedure: LAPAROSCOPIC ASSISTED RIGHT HEMI COLECTOMY AND RIGHT SALPINGO OOPHERECTOMY;  Surgeon: Glenna Fellows, MD;  Location: [facility];  Service: General;  Laterality: Right;   RETINAL DETACHMENT SURGERY  09/2019     Outpatient Encounter Medications as of 02/12/2023  Medication Sig Note   ALPRAZolam (XANAX) 0.25 MG tablet Take 1 tablet (0.25 mg total) by mouth daily as needed for anxiety.    Cholecalciferol (VITAMIN D3 PO) Take by mouth daily.    docusate sodium (COLACE) 100 MG capsule 1 capsule as needed    gabapentin (NEURONTIN) 100 MG capsule Take 1 capsule (100 mg total) by mouth at bedtime.    lidocaine-prilocaine (EMLA) cream Apply 1 Application topically as needed.    NORVASC 2.5 MG tablet TAKE 1 TABLET BY MOUTH EVERY DAY    ondansetron (ZOFRAN) 8 MG tablet Take 1 tablet (8 mg total) by mouth every 8 (eight) hours as needed for nausea or vomiting.    prochlorperazine (COMPAZINE) 10 MG tablet Take 1 tablet (10  mg total) by mouth every 6 (six) hours as needed for nausea or vomiting.    [DISCONTINUED] capecitabine (XELODA) 500 MG tablet Take 3 tablets (1,500 mg total) by mouth 2 (two) times daily after a meal. Take within 30 minutes after meals. Take for 14 days on, then off for 7 days. Repeat every 21 days. (Patient not taking: Reported on 02/12/2023) 01/09/2023: Pt never started; she is currently on FOLFOX chemo tx   [DISCONTINUED] mirtazapine (REMERON) 7.5 MG tablet TAKE 1 TABLET BY MOUTH AT BEDTIME. (Patient not taking: Reported on 02/12/2023)    Facility-Administered Encounter Medications as of 02/12/2023  Medication   0.9 %  sodium chloride infusion     Today's Vitals   02/12/23 1029 02/12/23 1031  BP: (!) 155/90 120/78  Pulse: 87   Resp: 16   Temp: 97.9 F (36.6 C)   TempSrc: Temporal   SpO2: 100%   Weight: 105 lb 12.8 oz (48 kg)    Body mass index is 17.08 kg/m.   PHYSICAL EXAM GENERAL:alert, no distress and comfortable SKIN: no rash  EYES: sclera clear NECK: without mass LYMPH:  no palpable cervical or supraclavicular lymphadenopathy  LUNGS: clear with normal breathing effort HEART: regular rate &  rhythm, no lower extremity edema ABDOMEN: abdomen soft, non-tender and normal bowel sounds NEURO: alert & oriented x 3 with fluent speech, no focal motor/sensory deficits.  Intact peripheral vibratory sense over the fingertips per tuning fork exam PAC without erythema    CBC    Component Value Date/Time   WBC 2.7 (L) 02/12/2023 0956   WBC 4.9 07/02/2022 0827   RBC 3.49 (L) 02/12/2023 0956   HGB 10.2 (L) 02/12/2023 0956   HGB 11.3 (L) 05/13/2017 1252   HCT 32.2 (L) 02/12/2023 0956   HCT 36.2 05/13/2017 1252   PLT 109 (L) 02/12/2023 0956   PLT 189 05/13/2017 1252   MCV 92.3 02/12/2023 0956   MCV 87.4 05/13/2017 1252   MCH 29.2 02/12/2023 0956   MCHC 31.7 02/12/2023 0956   RDW 13.1 02/12/2023 0956   RDW 13.9 05/13/2017 1252   LYMPHSABS 1.0 02/12/2023 0956   LYMPHSABS 1.0  05/13/2017 1252   MONOABS 0.3 02/12/2023 0956   MONOABS 0.3 05/13/2017 1252   EOSABS 0.1 02/12/2023 0956   EOSABS 0.1 05/13/2017 1252   BASOSABS 0.0 02/12/2023 0956   BASOSABS 0.0 05/13/2017 1252     CMP     Component Value Date/Time   NA 138 02/12/2023 0956   NA 140 05/13/2017 1252   K 4.2 02/12/2023 0956   K 3.7 05/13/2017 1252   CL 105 02/12/2023 0956   CO2 30 02/12/2023 0956   CO2 28 05/13/2017 1252   GLUCOSE 92 02/12/2023 0956   GLUCOSE 74 05/13/2017 1252   BUN 14 02/12/2023 0956   BUN 12.5 05/13/2017 1252   CREATININE 0.67 02/12/2023 0956   CREATININE 0.9 05/13/2017 1252   CALCIUM 8.5 (L) 02/12/2023 0956   CALCIUM 9.4 05/13/2017 1252   PROT 7.0 02/12/2023 0956   PROT 7.6 05/13/2017 1252   ALBUMIN 3.5 02/12/2023 0956   ALBUMIN 4.1 05/13/2017 1252   AST 21 02/12/2023 0956   AST 19 05/13/2017 1252   ALT 10 02/12/2023 0956   ALT 10 05/13/2017 1252   ALKPHOS 87 02/12/2023 0956   ALKPHOS 63 05/13/2017 1252   BILITOT 0.4 02/12/2023 0956   BILITOT 0.47 05/13/2017 1252   GFRNONAA >60 02/12/2023 0956   GFRAA >60 08/18/2019 0905     ASSESSMENT & PLAN:Brittney Spencer is a 72 y.o. female with   1. Rectal adenocarcinoma, with liver and right pelvic metastasis, recurrence from previous colon cancer vs new primary   -Diagnosed on routine screening colonoscopy 05/2020, rectal mass biopsy showed invasive adenocarcinoma, arising from a tubular adenoma -Her 07/12/20 PET showed positive uptake of known rectal tumor and soft tissue mass within the right iliac fossa indicating recurrent prior colon cancer. There is also left lobe of liver lesion is FDG avid concerning for liver metastasis. pt declined liver biopsy -whether this is metastatic rectal or recurrent/metastatic colon cancer, this is likely not curable, but still treatable. She previously declined referral for HIPEC -She progressed on first-line FOLFIRI/Beva -She switched to FOLFOX and continued Beva starting 03/28/2022.   Due to stable disease and neuropathy, oxaliplatin was stopped 09/2022 -Restaging CT 12/30/2022 showed cancer progression in the liver, and rising CEA consistent with disease progression -She resumed dose-reduced FOLFOX/Bev a 01/01/2023 with G-CSF -Ms. Saley appears stable.  She tolerated cycle 1 FOLFOX/Beva well except neuropathy, which is constant but mild tingling in her fingertips and toes; not limiting function.  -Tuning fork exam is normal.  I recommend to start B complex vitamin and take gabapentin nightly as needed -She  otherwise tolerated well, able to recover and function with good performance status. -Labs reviewed, ANC 1.4, CBC/CMP are adequate to proceed with cycle 2 FOLFOX/Beva as scheduled, same dose.  G-CSF on day 3 -Follow-up and cycle 3 in 2 weeks   2. Cancer of ascending colon, [id number], stage IIIC, MSI-stable, (+) surgical margins at pelvic wall     -She was diagnosed in 12/2015. She is s/p right hemicolectomy with right salpingo oophorectomy and  adjuvant ChemoRT. -she declined adjuvant chemo due to the concern of side effects and impact on her quality of life. -Her 04/2019 CT scan was NED  -With rectal cancer diagnosis, her PET from 07/12/20 indicates soft tissue mass within the right iliac fossa is noted and consistent with local tumor recurrence from previous ascending colon tumor.  Subsequent imaging showed this resolved after beginning treatment -She declined option of liver biopsy.     3. HTN, Anxiety -She'll follow-up with her primary care physician and continue medication -She uses half tab Xanax as needed, mostly only on treatment days. refilled -stable, refilled      PLAN: -Labs reviewed, ANC 1.4 -Proceed with C2 FOLFOX/Beva, same dose reduction; GCSF on day 3 -Symptom management for CIPN: start B complex vitamin and take gabapentin nightly as needed (Rx sent) -Follow-up and cycle 3 in 2 weeks   All questions were answered. The patient knows to call the clinic  with any problems, questions or concerns. No barriers to learning were detected. I spent 20 minutes counseling the patient face to face. The total time spent in the appointment was 30 minutes and more than 50% was on counseling, review of test results, and coordination of care.   Santiago Glad, NP-C 02/12/2023

## 2023-02-12 NOTE — Progress Notes (Signed)
Per Santiago Glad, NP, OK to treat with ANC 1.4  Pt to receive GCSF on D3 of this cycle.

## 2023-02-13 ENCOUNTER — Ambulatory Visit: Payer: Federal, State, Local not specified - PPO

## 2023-02-14 ENCOUNTER — Inpatient Hospital Stay: Payer: Federal, State, Local not specified - PPO

## 2023-02-14 ENCOUNTER — Other Ambulatory Visit: Payer: Self-pay

## 2023-02-14 VITALS — BP 125/83 | HR 85 | Temp 98.9°F | Resp 16

## 2023-02-14 DIAGNOSIS — Z5112 Encounter for antineoplastic immunotherapy: Secondary | ICD-10-CM | POA: Diagnosis not present

## 2023-02-14 DIAGNOSIS — C787 Secondary malignant neoplasm of liver and intrahepatic bile duct: Secondary | ICD-10-CM

## 2023-02-14 MED ORDER — HEPARIN SOD (PORK) LOCK FLUSH 100 UNIT/ML IV SOLN
500.0000 [IU] | Freq: Once | INTRAVENOUS | Status: AC | PRN
  Administered 2023-02-14: 500 [IU]

## 2023-02-14 MED ORDER — PEGFILGRASTIM-JMDB 6 MG/0.6ML ~~LOC~~ SOSY
6.0000 mg | PREFILLED_SYRINGE | Freq: Once | SUBCUTANEOUS | Status: AC
  Administered 2023-02-14: 6 mg via SUBCUTANEOUS
  Filled 2023-02-14: qty 0.6

## 2023-02-14 MED ORDER — SODIUM CHLORIDE 0.9% FLUSH
10.0000 mL | INTRAVENOUS | Status: DC | PRN
  Administered 2023-02-14: 10 mL

## 2023-02-18 ENCOUNTER — Ambulatory Visit: Payer: Federal, State, Local not specified - PPO | Admitting: Physical Therapy

## 2023-02-18 ENCOUNTER — Encounter: Payer: Self-pay | Admitting: Physical Therapy

## 2023-02-18 ENCOUNTER — Telehealth: Payer: Self-pay

## 2023-02-18 DIAGNOSIS — M6281 Muscle weakness (generalized): Secondary | ICD-10-CM

## 2023-02-18 DIAGNOSIS — R209 Unspecified disturbances of skin sensation: Secondary | ICD-10-CM

## 2023-02-18 DIAGNOSIS — R262 Difficulty in walking, not elsewhere classified: Secondary | ICD-10-CM

## 2023-02-18 DIAGNOSIS — C787 Secondary malignant neoplasm of liver and intrahepatic bile duct: Secondary | ICD-10-CM

## 2023-02-18 NOTE — Telephone Encounter (Signed)
Pt called stating her RLE "Knee" was "puffy".  Pt stated she's been working with PT and the physical therapist stated she was going to send Dr. Mosetta Putt a message regarding pt's RLE.  Pt denies redness and stated the RLE was normal to touch.  Pt rated pain at 4/10 on numeric pain scale on w/movement.  Pt stated the knee was "puffy".  Pt denied hx of arthritis but did state she hit her knee yesterday against something by accident.  Dr. Mosetta Putt notified and Dr. Mosetta Putt requested to see pt sooner in clinic to r/o DVT.

## 2023-02-18 NOTE — Therapy (Addendum)
 OUTPATIENT PHYSICAL THERAPY ONCOLOGY TREATMENT  Patient Name: Brittney Spencer MRN: [id number] DOB:1951/03/03, 72 y.o., female Today's Date: 02/18/2023  END OF SESSION:  PT End of Session - 02/18/23 0903     Visit Number 11    Number of Visits 17    Date for PT Re-Evaluation 02/18/23    PT Start Time 0902    PT Stop Time 0955    PT Time Calculation (min) 53 min    Activity Tolerance Patient tolerated treatment well    Behavior During Therapy [facility] for tasks assessed/performed                Past Medical History:  Diagnosis Date   AAA (abdominal aortic aneurysm) (HCC)    infrarenal 4.1 cmper s-9-19 scan on chart   Anemia    hx of   Anxiety    has PRN meds   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 Some residual intraretinal fluid remains, very small localized subfoveal of fluid remains although this continues to slowly resorb. We'll continue to observe.   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   Resolved March 2021 post vitrectomy   Past Surgical History:  Procedure Laterality Date   COLONOSCOPY  2018   HD-hams   COLONSCOPY  12/2015   IR IMAGING GUIDED PORT INSERTION  08/04/2020   LAPAROSCOPIC RIGHT HEMI COLECTOMY Right 02/07/2016   Procedure: LAPAROSCOPIC ASSISTED RIGHT HEMI COLECTOMY AND RIGHT SALPINGO OOPHERECTOMY;  Surgeon: Morene Olives, MD;  Location: [facility];  Service: General;  Laterality: Right;   RETINAL DETACHMENT SURGERY  09/2019   Patient Active Problem List   Diagnosis Date Noted   Peripheral neuropathy due to chemotherapy (HCC) 11/14/2022   DNR (do not  resuscitate) 04/25/2022   Encounter for antineoplastic chemotherapy 11/29/2021   Macular hole of right eye 02/20/2021   Macular pucker, right eye 02/20/2021   Port-A-Cath in place 08/10/2020   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) 07/04/2020   History of vitrectomy 03/28/2020   Vitreomacular traction, left 03/28/2020   Nuclear sclerotic cataract of left eye 03/28/2020   Follow-up examination after eye surgery 10/21/2019   Essential hypertension 03/26/2016   Iron  deficiency anemia due to chronic blood loss 03/26/2016   Cancer of ascending colon (HCC) 02/07/2016   Low hemoglobin 01/05/2016   Perforated appendicitis 09/15/2015    PCP: none  REFERRING PROVIDER: Lanny Callander, MD  REFERRING DIAG: C20,C78.7 (ICD-10-CM) - Rectal cancer metastasized to liver [facility])  THERAPY DIAG:  Muscle weakness (generalized)  Unspecified disturbances of skin sensation  Difficulty in walking, not elsewhere classified  Rectal cancer metastasized to liver [facility])  ONSET DATE: 11/23/22  Rationale for Evaluation and Treatment: Rehabilitation  SUBJECTIVE:  SUBJECTIVE STATEMENT:  I have concerns about the right side of my leg being less flexible.   PERTINENT HISTORY:  Cancer of ascending colon (HCC), diagnosed in 12/2015. S/p right hemicolectomy with right salpingo oophorectomy and adjuvant chemo and radiation.Diagnosed with rectal cancer with mets to the liver in 2021. her PET from 07/12/20 indicates soft tissue mass within the right iliac fossa is noted and consistent with local tumor recurrence from previous ascending colon tumor. Restaging on 10/07/22 showed overall stable disease with multiple small lung nodules. Currently on chemo and has neuropathy and anemia  PAIN:  Are you having pain? No not presently,Discomfort in R  thigh  PRECAUTIONS: Other: metastatic rectal cancer to liver  WEIGHT BEARING RESTRICTIONS: No  FALLS:  Has patient fallen in last 6 months? No  LIVING ENVIRONMENT: Lives with: lives alone Lives in: House/apartment Stairs: No;  Has following equipment at home: Grab bars  OCCUPATION: retired  LEISURE: pt intentionally walks  HAND DOMINANCE: right   PRIOR LEVEL OF FUNCTION: Independent  PATIENT GOALS: to see if it offers any other suggestions as far as the body and what else I can do for the neuropathy   OBJECTIVE:  COGNITION: Overall cognitive status: Within functional limits for tasks assessed    SENSATION: Tingling in bilateral feet and hands  POSTURE: forward head and rounded shoulders  UPPER EXTREMITY AROM/PROM: WFL  UPPER EXTREMITY STRENGTH: 5/5   LOWER EXTREMITY MMT:  MMT Right eval RIGHT 02/18/23  Hip flexion 2+/5 2+/5  Hip extension 2+/5 2+/5  Hip abduction 2+/5 3+/5  Hip adduction    Hip internal rotation    Hip external rotation    Knee flexion 3+/5 3/5  Knee extension 5/5 3+/5  Ankle dorsiflexion 5/5 5/5  Ankle plantarflexion    Ankle inversion    Ankle eversion     (Blank rows = not tested)  MMT LEFT eval LEFT 02/18/23  Hip flexion 3+/5 3+/5  Hip extension 2+/5 2+/5  Hip abduction 3+/5 4/5  Hip adduction    Hip internal rotation    Hip external rotation    Knee flexion 4/5 3+/5  Knee extension 5/5 5/5  Ankle dorsiflexion 5/5 4+/5  Ankle plantarflexion    Ankle inversion    Ankle eversion     (Blank rows = not tested)     FUNCTIONAL TESTS:  At eval: 30 seconds chair stand test 11 reps without use of UEs which is average for her age  34/20/24: 11 reps in 30 sec  12/31/22: Lars Balance Test Sit to Stand: Able to stand without using hands and stabilize independently Standing Unsupported: Able to stand safely 2 minutes Sitting with Back Unsupported but Feet Supported on Floor or Stool: Able to sit safely and securely 2  minutes Stand to Sit: Sits safely with minimal use of hands Transfers: Able to transfer safely, minor use of hands Standing Unsupported with Eyes Closed: Able to stand 10 seconds safely Standing Ubsupported with Feet Together: Able to place feet together independently and stand 1 minute safely From Standing, Reach Forward with Outstretched Arm: Can reach forward >12 cm safely (5) From Standing Position, Pick up Object from Floor: Able to pick up shoe safely and easily From Standing Position, Turn to Look Behind Over each Shoulder: Turn sideways only but maintains balance Turn 360 Degrees: Able to turn 360 degrees safely but slowly Standing Unsupported, Alternately Place Feet on Step/Stool: Able to stand independently and safely and complete 8 steps in 20 seconds Standing Unsupported, One Foot  in Front: Needs help to step but can hold 15 seconds Standing on One Leg: Tries to lift leg/unable to hold 3 seconds but remains standing independently Total Score: 45   GAIT: Distance walked: 25 feet Assistive device utilized: None Level of assistance: SBA Comments: foot flat, slightly unsteady appearing  SINGLE LIMB STANCE: On eval: 2 sec on R 3 sec on L  02/18/23: R- 6 sec    L - 15 sec  TODAY'S TREATMENT:                                                                                                                                         DATE:  02/11/2023 Nu step seat 8, UE 9, level 4 x 9 min 36 sec, 673 steps Bilateral piriformis stretch x 2 with 30 sec holds and v/c to not move during stretch Bilateral hamstring stretch in sitting x 30 sec holds Hip flexion x 10 bilaterally with 1 HHA on //bar, x 10 B  Right hip flexor stretch standing x 3 with v/c for form and pt return demo Standing heel raises x 10 no HH SLS x 5 ea; spent time practicing wt shifting to get pt used to shifting wt entirely over stance leg before raising opposite foot with increased difficult on the R side 8 inch step  ups with emphasis placed on controlled descent x 5 bilaterally Rolling to IT band: L sidelying to R IT band x 5 min followed by gentle STM to R IT band - pt reported decreased discomfort after this L sidelying R IT band stretch with therapist assist to drape leg off edge of mat 02/04/2023 Nu step seat 8, UE 9, level 4 x 8 min, 528 steps Bilateral piriformis stretch x 2  Hip flexion x 10 bilaterally with hand hold, x 10 B no HH Right hip flexor stretch standing x 3 Standing heel raises x 15 no HH SLS x 5 ea; spent time practicing wt shifting to get pt used to shifting wt entirely over stance leg before raising opposite foot 8 in partial step up x 10 emphasis on lowering slowly B, 8 in foot placement Bilaterally x 5 no hands 8 in lateral foot placement x 10 B no HH 01/23/2023 Nu step seat 8, UE 9, level 3 x 8 min 629 steps Hip flexion 2 x10 B, marching x 10 alternating with HH Standing heel raises no HH x 15 6 in step ups 1 x 10, light hold on bars Foot placement forward on step left x 10, right x 10 6 in step ups repeated x 10 ea Foot placement laterally on step x 10 ea Incline stretch at bars 3 x 20 sec Ax beam x 3 laps forward and 2 laps sideways Bridging 2 x 5 coordinating breathing Right hip fle    01/20/2023 Nu step seat 8, UE 9 level 3 x 8 min, 530 steps  Marching no HH x 10 Marching knee and foot in front 2 x 5 with HH Standing heel raises no HH x 15 6 in step ups 2 x 10, light hold on bars Standing knee flexion x 10 Standing hip abd and flexion with SLR 1# x 10 ea, then x 5 ea Ax beam 3 laps forward and 3 laps sidestepping with intermittent hand touch Supine bridge with breathing focus 2 x 5 Right hip flexor stretch supine with CR stretch x 3, repeated on left.  Quad sets into towel roll at heel x 10 01/15/2023 Nu step seat 8, UE 9 level 3 x 8 min 472 steps Seated hamstring stretch pt returned therapist demo 30 sec holds bilaterally x 2- more tight on R side Piriformis  stretch x 30 sec holds bilaterally SLR x 10 reps bilaterally with v/c to engage core and to coordinate it with diaphragmatic breathing Bridging with pt returning therapist demo x 10 with v/c to keep core engaged and to coordinate with diaphragmatic breathing Gentle suction cupping with silicone cups to gently  mobilize skin and fascia - initially with no cocoa butter and then added cocoa butter to be able to move cups  01/13/2023 Nu step seat 8, UE 9 level 2 x 7 min 330 steps Seated hamstring stretch pt returned therapist demo 30 sec holds bilaterally x 2- more tight on R side Piriformis stretch x 30 sec holds bilaterally Supine on mat being mindful not to put any weight on port - therapist placing gentle pressure over hip to extend hip with knee bent to 90 and therapist pulling lower leg back for hip flexor stretch x 3 with 1 min holds SLR x 10 reps bilaterally with increased difficulty on R side Bridging with pt returning therapist demo x 10 with v/c to keep core engaged Gentle MFR to R lower abdominal area where pt has discomfort - gently to mobilize skin and fascia  01/08/2023 Nu step seat 7, UE 9 level 3 x 7 min 324 steps Seated hamstring stretch pt returned therapist demo 30 sec holds bilaterally x 2- more tight on R side Standing quad stretch in // bars with chair behind x 60 sec holds x 2 bilaterally with pt more tight on R side Standing march 2 x 10 intermittent hand hold Heel raises in // bars occasional HH x 15 with increased difficulty keeping weight through entire foot  Bilateral gastroc stretches 1 x 60 sec with runners stretch Step onto ax forward x 10 ea, lateral x 10 ea LAQ 3 # x 10 ea Airex beam forward x 3 laps, backwards x 3 laps intermittent HH, sidestepping on airex x 3 each with increased difficulty maintaining balance on beam Tandem stance x 3 ea with intermittent HH Single limb stance x 3 each with HHA most of the time Instructed pt in mini squats and pt returned  therapist demo x 10   01/06/2023 Nu step seat 8, UE 9 lev 2-3 x 5 min 225 steps Standing march 2 x 10 intermittent hand hold Heel raises no HH x 15 Bilateral gastroc stretches 2 x 20 sec with runners stretch Step onto ax forward x 10 ea, lateral x 10 ea LAQ 3 # x 10 ea Seated HS stretch x 3 ea Airex beam forward x 3 laps, backwards x 3 laps intermittent HH Tandem stance x 3 ea with intermittent HH  12/31/22:  Performed Berg Balance test - see above pt scored a 45/50, s he had difficulty with  balance with turning quickly and reaching forward with feet planted Instructed pt in seated hamstring stretch in sitting- pt required max v/c and t/c cues for this to avoid scapular protraction, twisting, keeping knee straight and foot pulled back with 20 sec holds - pt felt tight bilaterally Instructed in quad stretch with chair behind her at the counter with pt able to hold about 10 sec with good stretch felt bilaterally Tandem stance in the corner and educated pt to do this in the kitchen facing the corner with counter on either side for support  Created HEP  12/24/22: quad stretch in standing with HHA at counter  PATIENT EDUCATION:  Education details:hamstring stretch without protraction of scapula, quad stretch with chair for leg support, tandem stance in corner Person educated: Patient Education method: Explanation Education comprehension: verbalized understanding  HOME EXERCISE PROGRAM: Sit to stands without use of UEs Access Code: [email or web address] URL: [email or web address] Date: 12/31/2022 Prepared by: Florina Lanis Carbon  Exercises - Seated Hamstring Stretch  - 1 x daily - 7 x weekly - 1 sets - 3-5 reps - work up to 30 sec hold - Theatre manager with Chair and Counter Support  - 1 x daily - 7 x weekly - 1 sets - 3-5 reps - work up to 30 sec  hold - Standing Tandem Balance with Counter Support  - 1 x daily - 7 x weekly - 1 sets - 5-10 reps - work up to 30 sec   hold  ASSESSMENT:  CLINICAL IMPRESSION: Assessed pt's progress twoards goals today. Pt has been having R thigh pain for a week. She reports she has been more active but not necessarily doing the given exercises. Educated pt that it is important to stick to the specific HEP to avoid pain and understand what exercises/movements are causing issues. Educated pt that it is important to see her oncologist to rule out anything else going on since there is also recent onset of R thigh pain and visible swelling in her knee. Pt also demonstrates worsening weakness in RLE compared to eval. Her balance has improved greatly since her eval and she has met her balance goal. Pt would benefit from continued skilled PT services to continue to progress LE strength to allow improved function.  . OBJECTIVE IMPAIRMENTS: decreased activity tolerance, decreased balance, difficulty walking, decreased strength, increased fascial restrictions, impaired flexibility, impaired sensation, and postural dysfunction.   ACTIVITY LIMITATIONS: stairs and locomotion level  PARTICIPATION LIMITATIONS: cleaning, driving, and community activity  PERSONAL FACTORS: Age, Fitness, and Time since onset of injury/illness/exacerbation are also affecting patient's functional outcome.   REHAB POTENTIAL: Good  CLINICAL DECISION MAKING: Stable/uncomplicated  EVALUATION COMPLEXITY: Low  GOALS: Goals reviewed with patient? Yes  SHORT TERM GOALS: Target date: 01/21/23  Pt will be able to do SLS bilaterally for 5 sec each to decrease fall risk. Baseline: Goal status: MET 02/18/23 R 6 sec, L 15 sec  2.  Pt will be independent with initial HEP for stretching and strengthening.  Baseline:  Goal status: MET  02/04/2023  3.  Pt will be able to ascend/descend the curb without difficulty to allow improved mobility.  Baseline:  Goal status: MET 02/18/23  4.  Pt will demonstrate 3/5 R hip flexor strength to decrease fall risk.  Baseline:  Goal  status: IN PROGRESS 02/18/23 2+/5   LONG TERM GOALS: Target date: 02/18/23  Pt will demonstrate 3+/5 hip flexor strength on the R to decrease fall risk.  Baseline:  Goal status: IN PROGRESS  02/18/23 2+/5  2.  Pt will be able to get in and out of the car without manually lifting her R leg. Baseline:  Goal status: IN PROGRESS 02/18/23- still having to lift on occasion  3.  Pt will be able to ascend/descend a flight of steps with out assistance or fear of falling.  Baseline:  Goal status:MET 02/04/2023 using rail  4.  Pt will demonstrate 3/5 bilateral hip extensor strength to decrease fall risk.  Baseline:  Goal status: IN PROGRESS 02/18/23- 2+/5  5.  Pt will be independent in a home exercise program for continued stretching and strengthening.  Baseline:  Goal status: IN PROGRESS   PLAN:  PT FREQUENCY: 2x/week  PT DURATION: 4 weeks  PLANNED INTERVENTIONS: Therapeutic exercises, Therapeutic activity, Neuromuscular re-education, Balance training, Gait training, Patient/Family education, Self Care, Joint mobilization, Stair training, and Manual therapy  PLAN FOR NEXT SESSION:  what did doctor say? NuStep, LE strengthening, balance exercises   [employer], PT 02/18/2023, 10:09 AM  PHYSICAL THERAPY DISCHARGE SUMMARY  Visits from Start of Care: 11  Current functional level related to goals / functional outcomes: See above   Remaining deficits: See above   Education / Equipment: HEP   Patient agrees to discharge. Patient goals were partially met. Patient is being discharged due to not returning since the last visit.  [facility] [place], [place] 01/22/24 11:40 AM

## 2023-02-19 ENCOUNTER — Telehealth: Payer: Self-pay | Admitting: Hematology

## 2023-02-19 NOTE — Progress Notes (Unsigned)
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  -She began first-line chemo with dose-reduced FOLFIRI and Beva in 2/22.  -we changed to FOLFOX with continued Beva on 03/28/22. She tolerated well overall with some fatigue and cold sensitivity. 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 -Restarted FOLFOX/bevacizumab on 01/01/2023. Dose reduced due to existing neuropathy.  -she was last seen 02/12/2023 by Clayborn Heron, NP prior to cycle 2. Was doing well in general. She proceeded with chemotherapy with continued reduced dose. She received growth factor on day 3 following chemotherapy 02/20/2023 - patient reporting mild pain and swelling of right lower extremity. Initially noted at physical therapy on Tuesday. States that she was advised to seek care at ED per physical therapist. Patient thought best to contact our office. She has good ROM and strength of both legs. About to walk and participate in activities without increasing the pain in her leg. She denies fall or trauma to the right leg. Has not noted redness, heat, or significant swelling of the right lower leg. She denies shortness of breath of any sort. Oxygen saturation is 100% on room air.   Localized swelling of right lower  extremity Mild swelling and tenderness of right lower leg, especially right knee.  -symptoms may be a result of Baker's cyst or swelling from osteoarthritis. Cannot rule out development of deep vein thrombosis.  -this was discussed with the patient and she voiced understanding and appreciation of the concern. --STAT doppler study of right lower extremity has been ordered.  -if positive, will treat with anticoagulant.  -reassess at visit 02/27/2023.    Plan: Tenderness and swelling may be development of Baker's cyst or swelling from osteoarthritis. Cannot rule out possibility of DVT. This was discussed with the patient and she voices understanding and appreciation of the concern.  Will get STAT doppler study of right lower extremity.  -if positive, will start anticoagulation She has follow up with lab and treatment scheduled 02/27/2023. Will reassess RLE swelling and pain at that time.   The patient understands the plans discussed today and is in agreement with them.  She knows to contact our office if she develops concerns prior to her next appointment.  I provided 20 minutes of face-to-face time during this encounter and > 50% was spent counseling as documented under my assessment and plan.    [name], NP  [facility] [address] [place] [address] Dept: [phone] Dept Fax: [phone]       CHIEF COMPLAINT:  CC: rectal cancer with metastases to liver and right pelvis  Current Treatment:  reduced dose FOLFOX/bevacizumab   INTERVAL HISTORY:  Brittney Spencer is here today for repeat clinical assessment. She denies fevers or chills. She denies pain. Her appetite is good.  Her weight has decreased 2 pounds over last week .  Rectal cancer Stage IV with liver and right pelvic metastasis, recurrence from previous colon cancer vs new primary   -Diagnosed by screening colonoscopy 05/2020.  -She began  first-line chemo with dose-reduced FOLFIRI and Beva in 2/22.  -we changed to FOLFOX with continued Beva on 03/28/22. She tolerated well overall with some fatigue and cold sensitivity. 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 -Restarted FOLFOX/bevacizumab on 01/01/2023. Dose reduced due to existing neuropathy.  -she was last seen 02/12/2023 by Clayborn Heron, NP prior to cycle 2. Was doing well in general. She proceeded with chemotherapy with continued reduced dose. She received growth factor on day 3 following chemotherapy 02/20/2023 - patient reporting mild pain and swelling of right lower extremity. Initially noted at physical therapy on Tuesday. States that she was advised to seek care at ED per physical therapist. Patient thought best to contact our office. She has good ROM and strength of both legs. About to walk and participate in activities without increasing the pain in her leg. She denies fall or trauma to the right leg. Has not noted redness, heat, or significant swelling of the right lower leg. She denies shortness of breath of any sort. Oxygen saturation is 100% on room air.   I have reviewed the past medical history, past surgical history, social history and family history with the patient and they are unchanged from previous note.  ALLERGIES:  is allergic to venofer [iron sucrose], fish allergy, peanut-containing drug products, soy allergy, and buspirone.  MEDICATIONS:  Current Outpatient Medications  Medication Sig Dispense Refill   ALPRAZolam (XANAX) 0.25 MG tablet Take 1 tablet (0.25 mg total) by mouth daily as needed for anxiety. 30 tablet 0   Cholecalciferol (VITAMIN D3 PO) Take by mouth daily.     docusate sodium (COLACE) 100 MG capsule 1  capsule as needed     gabapentin (NEURONTIN) 100 MG capsule Take 1 capsule (100 mg total) by mouth at bedtime. 30 capsule 1   lidocaine-prilocaine (EMLA) cream Apply 1 Application topically as needed. 30 g 1   NORVASC 2.5 MG tablet TAKE 1 TABLET BY MOUTH EVERY DAY 90 tablet 1   ondansetron (ZOFRAN) 8 MG tablet Take 1 tablet (8 mg total) by mouth every 8 (eight) hours as needed for nausea or vomiting. 20 tablet 2   prochlorperazine (COMPAZINE) 10 MG tablet Take 1 tablet (10 mg total) by mouth every 6 (six) hours as needed for nausea or vomiting. 30 tablet 2   Current Facility-Administered Medications  Medication Dose Route Frequency Provider Last Rate Last Admin   0.9 %  sodium chloride infusion  500 mL Intravenous Once Danis, Andreas Blower, MD        HISTORY OF PRESENT ILLNESS:   Oncology History Overview Note  Cancer Staging Cancer of ascending colon [facility]) Staging form: Colon and Rectum, AJCC 7th Edition - Clinical stage from 02/07/2016: Stage IIIC (T4b, N1b, M0) - Signed by [name], MD on 03/04/2016 Laterality: Right Residual tumor (R): R2 - Macroscopic    Cancer of ascending colon (HCC)  10/25/2015 Imaging   CT ABD/PELVIS:  Inflammatory changes inferior to the cecal tip appear improved, there is still irregular soft tissue thickening of the cecal  tip, and there are adjacent prominent lymph nodes in the ileocolonic mesentery, measuring 13 mm on image 49 and 8 mm on image 52. In addition, there is a 2.5 x 1.8 cm nodule on image 46 which has central low density. Therefore, these findings are moderately suspicious for an underlying cecal malignancy with perforation.    01/19/2016 Procedure   COLONOSCOPY per Dr. [name]: Fungating, ulcerated mass almost obstructing mid ascending colon   01/19/2016 Initial Biopsy   Diagnosis Surgical [P], cecal mass - INVASIVE ADENOCARCINOMA WITH ULCERATION. - SEE COMMENT.   02/05/2016 Tumor Marker   Patient's tumor was tested for the following markers:  CEA Results of the tumor marker test revealed 5.7.   02/07/2016 Initial Diagnosis   Cancer of ascending colon (HCC)   02/07/2016 Definitive Surgery   Laparoscopic assisted right hemicolectomy and right salpingo oopherectomy--[name]   02/07/2016 Pathologic Stage   p T4 N1b   2/43 nodes +   02/07/2016 Pathology Results   MMR normal; G2 adenocarcinoma;proximal & distal margins negative; soft tissue mass on pelvic sidewall + for adenocarcinoma with positive margin MSI Stable   03/08/2016 Imaging   CT chest negative for metastasis.    03/19/2016 - 04/25/2016 Radiation Therapy   Adjuvant irradiation, 50 gray in 28 fractions   03/19/2016 - 04/22/2016 Chemotherapy   Xeloda 1500 mg twice daily, started on 03/19/2016, dose reduced to 1000 mg twice daily from week 3 due to neutropenia, and patient stopped 3 days before last dose radiation due to difficulty swallowing the pill    05/20/2016 -  Adjuvant Chemotherapy   Patient declined adjuvant chemotherapy   09/16/2016 Imaging   CT CAP w Contrast 1. No evidence of local tumor recurrence at the ileocolic anastomosis. 2. No findings suspicious for metastatic disease in the chest, abdomen or pelvis. 3. Nonspecific trace free fluid in the pelvic cul-de-sac. 4. Stable solitary 3 mm right upper lobe pulmonary nodule, for which 6 month stability has been demonstrated, probably benign. 5. Additional findings include stable right posterior pericardial cyst and small calcified uterine fibroids.   05/13/2017 Imaging   CT CAP W Contrast 05/13/17 IMPRESSION: 1. No current findings of residual or recurrent malignancy. 2. Mild prominence of stool throughout the colon. Nondistended portions of the rectum. 3. Several tiny pulmonary nodules are stable from the earliest available comparison of 03/08/2016 and probably benign, but may merit surveillance. 4. Other imaging findings of potential clinical significance: Old granulomatous disease. Aortoiliac  atherosclerotic vascular disease. Lumbar spondylosis and degenerative disc disease. Stable amount of trace free pelvic fluid.   04/27/2018 Imaging   04/27/2018 CT CAP IMPRESSION: Stable exam. No evidence of recurrent or metastatic carcinoma within the chest, abdomen, or pelvis   04/19/2019 Imaging   CT CAP W Contrast  IMPRESSION: Chest Impression:   1. No evidence of thoracic metastasis. 2. Stable small bilateral pulmonary nodules.   Abdomen / Pelvis Impression:   1. No evidence local colorectal carcinoma recurrence or metastasis in the abdomen pelvis. 2. Post RIGHT hemicolectomy.   01/22/2021 Imaging   CT CAP  IMPRESSION: CT CHEST IMPRESSION   1. Similar nonspecific pulmonary nodules. 2. New posterior left upper lobe reticulonodular opacity, suspicious for interval mild infection or inflammation. 3. No thoracic adenopathy.   CT ABDOMEN AND PELVIS IMPRESSION   1. Further decrease in size of high left hepatic lobe 3 mm low-density lesion. No new or progressive metastatic disease within the abdomen or pelvis. 2. Similar trace free pelvic fluid. 3. Similar nonspecific mid rectal  wall thickening. 4.  Aortic Atherosclerosis (ICD10-I70.0).   04/23/2021 Imaging   CT CAP  IMPRESSION: 1. Treated metastatic lesion between segments 2 and 3 of the liver, slightly smaller and less distinct than prior examination. No other signs of definite metastatic disease elsewhere in the abdomen or pelvis. 2. Multiple small pulmonary nodules, stable compared to the prior examination, favored to be benign. No definitive findings to suggest metastatic disease to the thorax. 3. Aortic atherosclerosis. 4. Additional incidental findings, as above.   08/13/2021 Imaging   EXAM: CT CHEST, ABDOMEN, AND PELVIS WITH CONTRAST  IMPRESSION: 1. A previously seen PET avid lesion of the anterior left lobe of the liver, hepatic segment II, is no longer discretely appreciable consistent with  treatment response of a hepatic metastasis. 2. No evidence of new metastatic disease in the chest, abdomen, or pelvis. 3. Interval increase in a small focus of consolidation and nodularity of the medial left upper lobe, consistent with minimal, ongoing atypical infection. Additional tiny bilateral pulmonary nodules are stable and almost certainly incidental benign. Attention on follow-up. 4. Status post right hemicolectomy and ileocolic anastomosis.   07/02/2022 Imaging    IMPRESSION: CHEST IMPRESSION:   1. No evidence of thoracic metastasis. 2. Stable small pulmonary nodules.   PELVIS IMPRESSION:   1. Stable to slight decrease in size of subcapsular lesion in the RIGHT hepatic lobe. 2. No evidence of new or progressive disease in the abdomen pelvis. 3.  Aortic Atherosclerosis (ICD10-I70.0).     12/31/2022 Imaging    IMPRESSION: 1. Right hepatic lobe metastasis has undergone mild-to-moderate enlargement since 10/07/2022. A left hepatic lobe 5 mm lesion is not readily apparent on the prior, suspicious for a new metastasis. 2. Similar nonspecific tiny pulmonary nodules. 3. Similar amorphous soft tissue thickening within the posterior superior right hemipelvis, hypermetabolic on prior PET and indeterminate for residual disease versus scarring. 4. Similar equivocal soft tissue fullness within the rectum. 5. Increased size of an abdominopelvic ventral wall nodule for which subcutaneous metastasis are a concern. 6. New trace right pelvic fluid. 7. Incidental findings, including: Aortic Atherosclerosis (ICD10-I70.0). 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 - Patent side-to-side ileo-colonic anastomosis,  characterized by healthy appearing mucosa. - The examined portion of the ileum was normal. - One diminutive polyp in the proximal transverse colon, removed with a cold biopsy forceps. Resected and retrieved. - Likely malignant partially obstructing tumor in the mid rectum. Biopsied. Tattooed. - The examination was otherwise normal on direct and retroflexion views.   06/15/2020 Initial Biopsy   Diagnosis 1. Transverse Colon Polyp - HYPERPLASTIC POLYP 2. Rectum, biopsy - ADENOCARCINOMA ARISING IN A TUBULAR ADENOMA WITH HIGH-GRADE DYSPLASIA. SEE NOTE Diagnosis Note 2. Dr. Luisa Hart reviewed the case and concurs with the diagnosis. Dr. [name] was notified on 06/16/2020.   06/28/2020 Imaging   CT CAP  IMPRESSION: 1. New low-density focus in the anterior aspect of the lateral segment LEFT hepatic lobe measuring 1.2 x 1.0 cm, compatible with small metastatic lesion in the LEFT hepatic lobe. 2. Soft tissue in the RIGHT iliac fossa following RIGHT hemicolectomy invades the psoas musculature and is slowly enlarging over time, more linear on the prior study now highly concerning for recurrence/metastasis to this location. 3. Signs of enteritis, potentially post radiation changes  of the small bowel. Tethered small bowel in the RIGHT lower quadrant shows focal thickening and narrowing suspicious for small bowel involvement and developing partial obstruction though currently contrast passes beyond this point into the colon. 4. Rectal thickening in this patient with known rectal mass as described. 5. No evidence of metastatic disease in the chest. 6. Stable small pulmonary nodules. 7.  and aortic atherosclerosis.   Aortic Atherosclerosis (ICD10-I70.0) and Emphysema (ICD10-J43.9).   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 Exam positive for FDG avid rectal tumor which corresponds to the recent colonoscopy findings. 2. FDG  avid soft tissue mass within the right iliac fossa is noted and consistent with local tumor recurrence from previous ascending colon tumor. 3. Lateral segment left lobe of liver lesion is FDG avid concerning for liver metastasis. 4. No specific findings identified to suggest metastatic disease to the chest.   08/10/2020 -  Chemotherapy   First-line FOLFIRI q2weeks starting 08/10/20. dose reduced with cycle 1. Irinotecan/5FU increased and Bevacizumab added with cycle 2 on 08/23/2020    08/17/2020 - 03/09/2022 Chemotherapy   Patient is on Treatment Plan : COLORECTAL FOLFIRI + Bevacizumab q14d     10/27/2020 Imaging   CT CAP  IMPRESSION: 1. Interval decrease in size of the hypermetabolic left hepatic lesion, consistent with metastatic disease. No new liver lesion evident. 2. Interval resolution of the hypermetabolic soft tissue lesion along the right iliac fossa with no measurable soft tissue lesion remaining at this location today. 3. Similar appearance of soft tissue fullness in the rectum at the site of the hypermetabolic lesion seen previously. 4. Stable tiny bilateral pulmonary nodules. Continued attention on follow-up recommended. 5. Small volume free fluid in the pelvis. 6. Aortic Atherosclerosis (ICD10-I70.0).   01/22/2021 Imaging   CT CAP  IMPRESSION: CT CHEST IMPRESSION   1. Similar nonspecific pulmonary nodules. 2. New posterior left upper lobe reticulonodular opacity, suspicious for interval mild infection or inflammation. 3. No thoracic adenopathy.   CT ABDOMEN AND PELVIS IMPRESSION   1. Further decrease in size of high left hepatic lobe 3 mm low-density lesion. No new or progressive metastatic disease within the abdomen or pelvis. 2. Similar trace free pelvic fluid. 3. Similar nonspecific mid rectal wall thickening. 4.  Aortic Atherosclerosis (ICD10-I70.0).   04/23/2021 Imaging   CT CAP  IMPRESSION: 1. Treated metastatic lesion between segments 2 and 3 of the  liver, slightly smaller and less distinct than prior examination. No other signs of definite metastatic disease elsewhere in the abdomen or pelvis. 2. Multiple small pulmonary nodules, stable compared to the prior examination, favored to be benign. No definitive findings to suggest metastatic disease to the thorax. 3. Aortic atherosclerosis. 4. Additional incidental findings, as above.   08/13/2021 Imaging   EXAM: CT CHEST, ABDOMEN, AND PELVIS WITH CONTRAST  IMPRESSION: 1. A previously seen PET avid lesion of the anterior left lobe of the liver, hepatic segment II, is no longer discretely appreciable consistent with treatment response of a hepatic metastasis. 2. No evidence of new metastatic disease in the chest, abdomen, or pelvis. 3. Interval increase in a small focus of consolidation and nodularity of the medial left upper lobe, consistent with minimal, ongoing atypical infection. Additional tiny bilateral pulmonary nodules are stable and almost certainly incidental benign. Attention on follow-up. 4. Status post right hemicolectomy and ileocolic anastomosis.   03/28/2022 -  Chemotherapy   Patient is on Treatment Plan : COLORECTAL  FOLFOX + Bevacizumab q14d     07/02/2022 Imaging    IMPRESSION: CHEST IMPRESSION:   1. No evidence of thoracic metastasis. 2. Stable small pulmonary nodules.   PELVIS IMPRESSION:   1. Stable to slight decrease in size of subcapsular lesion in the RIGHT hepatic lobe. 2. No evidence of new or progressive disease in the abdomen pelvis.   10/07/2022 Imaging    IMPRESSION: 1. Stable hypovascular mass in the periphery of the right lobe of the liver, which likely represents a treated metastatic lesion. Stable subcentimeter lesion just medial to this is also similar to the recent prior examination. No new hepatic lesions are otherwise noted. 2. Persistent but stable poorly defined soft tissue thickening in the right lower quadrant associated with  multiple small bowel loops and the overlying iliopsoas musculature, which corresponds to focal hypermetabolism on remote prior PET-CT. Given the stability, this likely represents a treated metastatic lesion. Continued attention on follow-up studies is recommended. 3. Persistent mass-like thickening in the proximal rectum corresponding to previously noted hypermetabolic rectal neoplasm on prior PET-CT. This currently measures approximately 2.8 x 2.3 cm and is slightly more apparent than the most recent prior study. 4. Multiple small pulmonary nodules generally stable compared to the prior study, with exception of a new branching nodule in the anterior aspect of the left upper lobe measuring 7 x 3 mm (mean diameter 5 mm). This is nonspecific. Close attention on follow-up studies is recommended to ensure stability. 5. Aortic atherosclerosis. 6. Additional incidental findings, as above.     12/31/2022 Imaging    IMPRESSION: 1. Right hepatic lobe metastasis has undergone mild-to-moderate enlargement since 10/07/2022. A left hepatic lobe 5 mm lesion is not readily apparent on the prior, suspicious for a new metastasis. 2. Similar nonspecific tiny pulmonary nodules. 3. Similar amorphous soft tissue thickening within the posterior superior right hemipelvis, hypermetabolic on prior PET and indeterminate for residual disease versus scarring. 4. Similar equivocal soft tissue fullness within the rectum. 5. Increased size of an abdominopelvic ventral wall nodule for which subcutaneous metastasis are a concern. 6. New trace right pelvic fluid. 7. Incidental findings, including: Aortic Atherosclerosis (ICD10-I70.0). Possible constipation       REVIEW OF SYSTEMS:   Constitutional: Denies fevers, chills or abnormal weight loss Eyes: Denies blurriness of vision Ears, nose, mouth, throat, and face: Denies mucositis or sore throat Respiratory: Denies cough, dyspnea or wheezes Cardiovascular:  Denies palpitation, chest discomfort or lower extremity swelling Gastrointestinal:  Denies nausea, heartburn or change in bowel habits Skin: Denies abnormal skin rashes Lymphatics: Denies new lymphadenopathy or easy bruising Neurological:Denies numbness, tingling or new weaknesses Behavioral/Psych: Mood is stable, no new changes  Musculoskeletal: mild swelling and pain of right lower extremity.  All other systems were reviewed with the patient and are negative.   VITALS:   Today's Vitals   02/20/23 1026 02/20/23 1028  BP: (!) 146/70   Pulse: 83   Resp: 17   Temp: 97.8 F (36.6 C)   TempSrc: Oral   SpO2: 100%   Weight: 103 lb 11.2 oz (47 kg)   PainSc:  0-No pain   Body mass index is 16.74 kg/m.   Wt Readings from Last 3 Encounters:  02/20/23 103 lb 11.2 oz (47 kg)  02/12/23 105 lb 12.8 oz (48 kg)  01/09/23 105 lb 4 oz (47.7 kg)    Body mass index is 16.74 kg/m.  Performance status (ECOG): 1 - Symptomatic but completely ambulatory  PHYSICAL EXAM:   GENERAL:alert, no distress  and comfortable SKIN: skin color, texture, turgor are normal, no rashes or significant lesions EYES: normal, Conjunctiva are pink and non-injected, sclera clear OROPHARYNX:no exudate, no erythema and lips, buccal mucosa, and tongue normal  NECK: supple, thyroid normal size, non-tender, without nodularity LYMPH:  no palpable lymphadenopathy in the cervical, axillary or inguinal LUNGS: clear to auscultation and percussion with normal breathing effort HEART: regular rate & rhythm and no murmurs and no lower extremity edema ABDOMEN:abdomen soft, non-tender and normal bowel sounds Musculoskeletal:no cyanosis of digits and no clubbing. There is mild area of swelling present posterior right knee. Not tender with palpation. Negative Homan's sign. Minimal swelling present of RLE without redness or warmth. ROM and strength intact bilateral lower extremities.  NEURO: alert & oriented x 3 with fluent speech, no  focal motor/sensory deficits  LABORATORY DATA:  I have reviewed the data as listed    Component Value Date/Time   NA 138 02/12/2023 0956   NA 140 05/13/2017 1252   K 4.2 02/12/2023 0956   K 3.7 05/13/2017 1252   CL 105 02/12/2023 0956   CO2 30 02/12/2023 0956   CO2 28 05/13/2017 1252   GLUCOSE 92 02/12/2023 0956   GLUCOSE 74 05/13/2017 1252   BUN 14 02/12/2023 0956   BUN 12.5 05/13/2017 1252   CREATININE 0.67 02/12/2023 0956   CREATININE 0.9 05/13/2017 1252   CALCIUM 8.5 (L) 02/12/2023 0956   CALCIUM 9.4 05/13/2017 1252   PROT 7.0 02/12/2023 0956   PROT 7.6 05/13/2017 1252   ALBUMIN 3.5 02/12/2023 0956   ALBUMIN 4.1 05/13/2017 1252   AST 21 02/12/2023 0956   AST 19 05/13/2017 1252   ALT 10 02/12/2023 0956   ALT 10 05/13/2017 1252   ALKPHOS 87 02/12/2023 0956   ALKPHOS 63 05/13/2017 1252   BILITOT 0.4 02/12/2023 0956   BILITOT 0.47 05/13/2017 1252   GFRNONAA >60 02/12/2023 0956   GFRAA >60 08/18/2019 0905     Lab Results  Component Value Date   WBC 2.7 (L) 02/12/2023   NEUTROABS 1.4 (L) 02/12/2023   HGB 10.2 (L) 02/12/2023   HCT 32.2 (L) 02/12/2023   MCV 92.3 02/12/2023   PLT 109 (L) 02/12/2023   ADDENDUM I have seen the patient, examined her. I agree with the assessment and and plan and have edited the notes.   Ms. Boehnlein is here for an urgent visit of her right knee swollen.  She was referred by her physical therapist.  Exam showed mild edema in the right knee, no skin erythema or redness, no clinical concern for infection.  Will obtain Doppler to rule out DVT due to her underlying malignancy.  Questions were answered.  I spent a total of 15 minutes for her visit today, more than 50% time on face-to-face counseling.  [name] MD 02/20/2023

## 2023-02-19 NOTE — Assessment & Plan Note (Signed)
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  -She began first-line chemo with dose-reduced FOLFIRI and Beva in 2/22.  -we changed to FOLFOX with continued Beva on 03/28/22. She tolerated well overall with some fatigue and cold sensitivity. 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 -Restarted FOLFOX/bevacizumab on 01/01/2023. Dose reduced due to existing neuropathy.  -she was last seen 02/12/2023 by Clayborn Heron, NP prior to cycle 2. Was doing well in general. She proceeded with chemotherapy with continued reduced dose. She received growth factor on day 3 following chemotherapy 02/20/2023 - patient reporting mild pain and swelling of right lower extremity. Initially noted at physical therapy on Tuesday. States that she was advised to seek care at ED per physical therapist. Patient thought best to contact our office. She has good ROM and strength of both legs. About to walk and participate in activities without increasing the pain in her leg. She denies fall or trauma to the right leg. Has not noted redness, heat, or significant swelling of the right lower leg. She denies shortness of breath of any sort. Oxygen saturation is 100% on room air.

## 2023-02-20 ENCOUNTER — Other Ambulatory Visit: Payer: Self-pay

## 2023-02-20 ENCOUNTER — Other Ambulatory Visit: Payer: Federal, State, Local not specified - PPO

## 2023-02-20 ENCOUNTER — Ambulatory Visit: Payer: Federal, State, Local not specified - PPO | Admitting: Hematology

## 2023-02-20 ENCOUNTER — Inpatient Hospital Stay (HOSPITAL_BASED_OUTPATIENT_CLINIC_OR_DEPARTMENT_OTHER): Payer: Federal, State, Local not specified - PPO | Admitting: Nurse Practitioner

## 2023-02-20 ENCOUNTER — Ambulatory Visit: Payer: Federal, State, Local not specified - PPO

## 2023-02-20 ENCOUNTER — Ambulatory Visit (HOSPITAL_BASED_OUTPATIENT_CLINIC_OR_DEPARTMENT_OTHER)
Admission: RE | Admit: 2023-02-20 | Discharge: 2023-02-20 | Disposition: A | Discharge status: Home / Self Care | Payer: Federal, State, Local not specified - PPO | Source: Ambulatory Visit | Attending: Nurse Practitioner | Admitting: Nurse Practitioner

## 2023-02-20 VITALS — BP 146/70 | HR 83 | Temp 97.8°F | Resp 17 | Wt 103.7 lb

## 2023-02-20 DIAGNOSIS — C20 Malignant neoplasm of rectum: Secondary | ICD-10-CM

## 2023-02-20 DIAGNOSIS — R2241 Localized swelling, mass and lump, right lower limb: Secondary | ICD-10-CM

## 2023-02-20 DIAGNOSIS — C787 Secondary malignant neoplasm of liver and intrahepatic bile duct: Secondary | ICD-10-CM

## 2023-02-20 DIAGNOSIS — Z5112 Encounter for antineoplastic immunotherapy: Secondary | ICD-10-CM | POA: Diagnosis not present

## 2023-02-20 NOTE — Assessment & Plan Note (Signed)
Mild swelling and tenderness of right lower leg, especially right knee.  -symptoms may be a result of Baker's cyst or swelling from osteoarthritis. Cannot rule out development of deep vein thrombosis.  -this was discussed with the patient and she voiced understanding and appreciation of the concern. --STAT doppler study of right lower extremity has been ordered.  -if positive, will treat with anticoagulant.  -reassess at visit 02/27/2023.

## 2023-02-21 NOTE — Progress Notes (Signed)
Negative US doppler. Patient notified and released to go home.

## 2023-02-26 MED FILL — Dexamethasone Sodium Phosphate Inj 100 MG/10ML: INTRAMUSCULAR | Qty: 1 | Status: AC

## 2023-02-27 ENCOUNTER — Inpatient Hospital Stay (HOSPITAL_BASED_OUTPATIENT_CLINIC_OR_DEPARTMENT_OTHER): Payer: Federal, State, Local not specified - PPO | Admitting: Hematology

## 2023-02-27 ENCOUNTER — Inpatient Hospital Stay: Payer: Federal, State, Local not specified - PPO

## 2023-02-27 ENCOUNTER — Encounter: Payer: Self-pay | Admitting: Hematology

## 2023-02-27 VITALS — BP 138/78 | HR 87 | Temp 98.5°F | Resp 18 | Ht 66.0 in | Wt 105.2 lb

## 2023-02-27 DIAGNOSIS — Z5112 Encounter for antineoplastic immunotherapy: Secondary | ICD-10-CM | POA: Diagnosis not present

## 2023-02-27 DIAGNOSIS — C20 Malignant neoplasm of rectum: Secondary | ICD-10-CM

## 2023-02-27 DIAGNOSIS — C787 Secondary malignant neoplasm of liver and intrahepatic bile duct: Secondary | ICD-10-CM | POA: Diagnosis not present

## 2023-02-27 DIAGNOSIS — Z95828 Presence of other vascular implants and grafts: Secondary | ICD-10-CM

## 2023-02-27 LAB — CBC WITH DIFFERENTIAL (CANCER CENTER ONLY)
Abs Immature Granulocytes: 0.01 10*3/uL (ref 0.00–0.07)
Basophils Absolute: 0 10*3/uL (ref 0.0–0.1)
Basophils Relative: 1 %
Eosinophils Absolute: 0.1 10*3/uL (ref 0.0–0.5)
Eosinophils Relative: 2 %
HCT: 33 % — ABNORMAL LOW (ref 36.0–46.0)
Hemoglobin: 10.4 g/dL — ABNORMAL LOW (ref 12.0–15.0)
Immature Granulocytes: 0 %
Lymphocytes Relative: 24 %
Lymphs Abs: 1 10*3/uL (ref 0.7–4.0)
MCH: 28.8 pg (ref 26.0–34.0)
MCHC: 31.5 g/dL (ref 30.0–36.0)
MCV: 91.4 fL (ref 80.0–100.0)
Monocytes Absolute: 0.5 10*3/uL (ref 0.1–1.0)
Monocytes Relative: 12 %
Neutro Abs: 2.6 10*3/uL (ref 1.7–7.7)
Neutrophils Relative %: 61 %
Platelet Count: 109 10*3/uL — ABNORMAL LOW (ref 150–400)
RBC: 3.61 MIL/uL — ABNORMAL LOW (ref 3.87–5.11)
RDW: 13.1 % (ref 11.5–15.5)
WBC Count: 4.3 10*3/uL (ref 4.0–10.5)
nRBC: 0 % (ref 0.0–0.2)

## 2023-02-27 LAB — CMP (CANCER CENTER ONLY)
ALT: 9 U/L (ref 0–44)
AST: 19 U/L (ref 15–41)
Albumin: 3.6 g/dL (ref 3.5–5.0)
Alkaline Phosphatase: 91 U/L (ref 38–126)
Anion gap: 6 (ref 5–15)
BUN: 14 mg/dL (ref 8–23)
CO2: 27 mmol/L (ref 22–32)
Calcium: 8.7 mg/dL — ABNORMAL LOW (ref 8.9–10.3)
Chloride: 104 mmol/L (ref 98–111)
Creatinine: 0.72 mg/dL (ref 0.44–1.00)
GFR, Estimated: >60 mL/min (ref >60)
Glucose, Bld: 89 mg/dL (ref 70–99)
Potassium: 4 mmol/L (ref 3.5–5.1)
Sodium: 137 mmol/L (ref 135–145)
Total Bilirubin: 0.3 mg/dL (ref 0.3–1.2)
Total Protein: 7.3 g/dL (ref 6.5–8.1)

## 2023-02-27 LAB — TOTAL PROTEIN, URINE DIPSTICK: Protein, ur: NEGATIVE mg/dL

## 2023-02-27 MED ORDER — PALONOSETRON HCL INJECTION 0.25 MG/5ML
0.2500 mg | Freq: Once | INTRAVENOUS | Status: AC
  Administered 2023-02-27: 0.25 mg via INTRAVENOUS
  Filled 2023-02-27: qty 5

## 2023-02-27 MED ORDER — LORATADINE 10 MG PO TABS
10.0000 mg | ORAL_TABLET | Freq: Every day | ORAL | Status: DC
  Administered 2023-02-27: 10 mg via ORAL
  Filled 2023-02-27: qty 1

## 2023-02-27 MED ORDER — SODIUM CHLORIDE 0.9% FLUSH
10.0000 mL | Freq: Once | INTRAVENOUS | Status: AC
  Administered 2023-02-27: 10 mL

## 2023-02-27 MED ORDER — SODIUM CHLORIDE 0.9 % IV SOLN
10.0000 mg | Freq: Once | INTRAVENOUS | Status: AC
  Administered 2023-02-27: 10 mg via INTRAVENOUS
  Filled 2023-02-27: qty 10

## 2023-02-27 MED ORDER — FAMOTIDINE IN NACL 20-0.9 MG/50ML-% IV SOLN
20.0000 mg | Freq: Once | INTRAVENOUS | Status: AC
  Administered 2023-02-27: 20 mg via INTRAVENOUS
  Filled 2023-02-27: qty 50

## 2023-02-27 MED ORDER — OXALIPLATIN CHEMO INJECTION 100 MG/20ML
50.0000 mg/m2 | Freq: Once | INTRAVENOUS | Status: AC
  Administered 2023-02-27: 75 mg via INTRAVENOUS
  Filled 2023-02-27: qty 15

## 2023-02-27 MED ORDER — DEXTROSE 5 % IV SOLN: Freq: Once | INTRAVENOUS | Status: AC

## 2023-02-27 MED ORDER — SODIUM CHLORIDE 0.9 % IV SOLN
1800.0000 mg/m2 | INTRAVENOUS | Status: DC
  Administered 2023-02-27: 2500 mg via INTRAVENOUS
  Filled 2023-02-27: qty 50

## 2023-02-27 MED ORDER — LEUCOVORIN CALCIUM INJECTION 350 MG
400.0000 mg/m2 | Freq: Once | INTRAVENOUS | Status: AC
  Administered 2023-02-27: 600 mg via INTRAVENOUS
  Filled 2023-02-27: qty 30

## 2023-02-27 MED ORDER — SODIUM CHLORIDE 0.9 % IV SOLN
5.0000 mg/kg | Freq: Once | INTRAVENOUS | Status: AC
  Administered 2023-02-27: 250 mg via INTRAVENOUS
  Filled 2023-02-27: qty 10

## 2023-02-27 MED ORDER — SODIUM CHLORIDE 0.9 % IV SOLN: Freq: Once | INTRAVENOUS | Status: AC

## 2023-02-27 NOTE — Patient Instructions (Signed)
[facility]  Discharge Instructions: Thank you for choosing [facility] to provide your oncology and hematology care.   If you have a lab appointment with the Cancer Center, please go directly to the Cancer Center and check in at the registration area.   Wear comfortable clothing and clothing appropriate for easy access to any Portacath or PICC line.   We strive to give you quality time with your provider. You may need to reschedule your appointment if you arrive late (15 or more minutes).  Arriving late affects you and other patients whose appointments are after yours.  Also, if you miss three or more appointments without notifying the office, you may be dismissed from the clinic at the provider's discretion.      For prescription refill requests, have your pharmacy contact our office and allow 72 hours for refills to be completed.    Today you received the following chemotherapy and/or immunotherapy agents Bevacizumab / Oxaliplatin / Leucovorin / Fluorouracil      To help prevent nausea and vomiting after your treatment, we encourage you to take your nausea medication as directed.  BELOW ARE SYMPTOMS THAT SHOULD BE REPORTED IMMEDIATELY: *FEVER GREATER THAN 100.4 F (38 C) OR HIGHER *CHILLS OR SWEATING *NAUSEA AND VOMITING THAT IS NOT CONTROLLED WITH YOUR NAUSEA MEDICATION *UNUSUAL SHORTNESS OF BREATH *UNUSUAL BRUISING OR BLEEDING *URINARY PROBLEMS (pain or burning when urinating, or frequent urination) *BOWEL PROBLEMS (unusual diarrhea, constipation, pain near the anus) TENDERNESS IN MOUTH AND THROAT WITH OR WITHOUT PRESENCE OF ULCERS (sore throat, sores in mouth, or a toothache) UNUSUAL RASH, SWELLING OR PAIN  UNUSUAL VAGINAL DISCHARGE OR ITCHING   Items with * indicate a potential emergency and should be followed up as soon as possible or go to the Emergency Department if any problems should occur.  Please show the CHEMOTHERAPY  ALERT CARD or IMMUNOTHERAPY ALERT CARD at check-in to the Emergency Department and triage nurse.  Should you have questions after your visit or need to cancel or reschedule your appointment, please contact [facility]  Dept: [phone]  and follow the prompts.  Office hours are 8:00 a.m. to 4:30 p.m. Monday - Friday. Please note that voicemails left after 4:00 p.m. may not be returned until the following business day.  We are closed weekends and major holidays. You have access to a nurse at all times for urgent questions. Please call the main number to the clinic Dept: [phone] and follow the prompts.   For any non-urgent questions, you may also contact your provider using MyChart. We now offer e-Visits for anyone 81 and older to request care online for non-urgent symptoms. For details visit [email or web address].   Also download the MyChart app! Go to the app store, search "MyChart", open the app, select [facility], and log in with your MyChart username and password.

## 2023-02-27 NOTE — Progress Notes (Signed)
[facility]   [phone] [phone]) [phone]   Clinic Follow up Note   Patient Care Team: Patient, No Pcp Per as PCP - General (General Practice) [name], NP as Nurse Practitioner (Oncology) [name], MD as Consulting Physician (Oncology) [name], RN (Inactive) as Oncology Nurse Navigator  Date of Service:  02/27/2023  CHIEF COMPLAINT: f/u of metastatic rectal cancer, h/o colon cancer          CURRENT THERAPY:   FOLFOX with bevacizumab, q14d, starting 03/28/22   ASSESSMENT: Brittney Spencer is a 72 y.o. 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  -She began first-line chemo with dose-reduced FOLFIRI and Beva in 2/22.  -we changed to FOLFOX with continued Beva on 03/28/22. She tolerated well overall with some fatigue and cold sensitivity. -I personally reviewed her restaging CT from 10/07/2022 which showed overall SD in liver and rectum, stable and indermininate lung nodules, no other new lesions  -She has developed neuropathy in her hands and feet, grade 2, we stopped oxaliplatin in 09/2022, this has overall improved  -her restaging CT from 12/30/2022 showed cancer progression in her liver, she restarted FOLFOX and beva on 01/09/2023 -She is tolerating low-dose FOLFOX well, neuropathy is mild and stable, no impact on her function. -Plan to repeat a restaging CT scan in October   Cancer of ascending colon (HCC) diagnosed in 12/2015. S/p right hemicolectomy with right salpingo oophorectomy and adjuvant ChemoRT. 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 which we stopped in 09/2022, restarted in July 2024 due to disease progression -Mild and stable overall  -Continue over-the-counter B12     PLAN: - reviewed doppler of lower extremity is was negative - ok to continue physical therapy - continue FOLFOX and beva  - proceed to treatment today - repeat scan in October - f/u and labs in 2 weeks   SUMMARY OF ONCOLOGIC HISTORY: Oncology History Overview Note  Cancer Staging Cancer of ascending colon [name]) Staging form: Colon and Rectum, AJCC 7th Edition - Clinical stage from 02/07/2016: Stage IIIC (T4b, N1b, M0) - Signed by [name], MD on 03/04/2016 Laterality: Right Residual tumor (R): R2 - Macroscopic    Cancer of ascending colon (HCC)  10/25/2015 Imaging   CT ABD/PELVIS:  Inflammatory changes inferior to the cecal tip appear improved, there is still irregular soft tissue thickening of the cecal tip, and there are adjacent prominent lymph nodes in the ileocolonic mesentery, measuring 13 mm on image 49 and 8 mm on image 52. In addition, there is a 2.5 x 1.8 cm nodule on image 46 which has central low density. Therefore, these findings are moderately suspicious for an underlying cecal malignancy with perforation.    01/19/2016 Procedure   COLONOSCOPY per Dr. [name]: Fungating, ulcerated mass almost obstructing mid ascending colon   01/19/2016 Initial Biopsy   Diagnosis Surgical [P], cecal mass - INVASIVE ADENOCARCINOMA WITH ULCERATION. - SEE COMMENT.  02/05/2016 Tumor Marker   Patient's tumor was tested for the following markers: CEA Results of the tumor marker test revealed 5.7.   02/07/2016 Initial Diagnosis   Cancer of ascending colon (HCC)   02/07/2016 Definitive Surgery   Laparoscopic assisted right hemicolectomy and right salpingo  oopherectomy--[name]   02/07/2016 Pathologic Stage   p T4 N1b   2/43 nodes +   02/07/2016 Pathology Results   MMR normal; G2 adenocarcinoma;proximal & distal margins negative; soft tissue mass on pelvic sidewall + for adenocarcinoma with positive margin MSI Stable   03/08/2016 Imaging   CT chest negative for metastasis.    03/19/2016 - 04/25/2016 Radiation Therapy   Adjuvant irradiation, 50 gray in 28 fractions   03/19/2016 - 04/22/2016 Chemotherapy   Xeloda 1500 mg twice daily, started on 03/19/2016, dose reduced to 1000 mg twice daily from week 3 due to neutropenia, and patient stopped 3 days before last dose radiation due to difficulty swallowing the pill    05/20/2016 -  Adjuvant Chemotherapy   Patient declined adjuvant chemotherapy   09/16/2016 Imaging   CT CAP w Contrast 1. No evidence of local tumor recurrence at the ileocolic anastomosis. 2. No findings suspicious for metastatic disease in the chest, abdomen or pelvis. 3. Nonspecific trace free fluid in the pelvic cul-de-sac. 4. Stable solitary 3 mm right upper lobe pulmonary nodule, for which 6 month stability has been demonstrated, probably benign. 5. Additional findings include stable right posterior pericardial cyst and small calcified uterine fibroids.   05/13/2017 Imaging   CT CAP W Contrast 05/13/17 IMPRESSION: 1. No current findings of residual or recurrent malignancy. 2. Mild prominence of stool throughout the colon. Nondistended portions of the rectum. 3. Several tiny pulmonary nodules are stable from the earliest available comparison of 03/08/2016 and probably benign, but may merit surveillance. 4. Other imaging findings of potential clinical significance: Old granulomatous disease. Aortoiliac atherosclerotic vascular disease. Lumbar spondylosis and degenerative disc disease. Stable amount of trace free pelvic fluid.   04/27/2018 Imaging   04/27/2018 CT CAP IMPRESSION: Stable exam. No evidence of  recurrent or metastatic carcinoma within the chest, abdomen, or pelvis   04/19/2019 Imaging   CT CAP W Contrast  IMPRESSION: Chest Impression:   1. No evidence of thoracic metastasis. 2. Stable small bilateral pulmonary nodules.   Abdomen / Pelvis Impression:   1. No evidence local colorectal carcinoma recurrence or metastasis in the abdomen pelvis. 2. Post RIGHT hemicolectomy.   01/22/2021 Imaging   CT CAP  IMPRESSION: CT CHEST IMPRESSION   1. Similar nonspecific pulmonary nodules. 2. New posterior left upper lobe reticulonodular opacity, suspicious for interval mild infection or inflammation. 3. No thoracic adenopathy.   CT ABDOMEN AND PELVIS IMPRESSION   1. Further decrease in size of high left hepatic lobe 3 mm low-density lesion. No new or progressive metastatic disease within the abdomen or pelvis. 2. Similar trace free pelvic fluid. 3. Similar nonspecific mid rectal wall thickening. 4.  Aortic Atherosclerosis (ICD10-I70.0).   04/23/2021 Imaging   CT CAP  IMPRESSION: 1. Treated metastatic lesion between segments 2 and 3 of the liver, slightly smaller and less distinct than prior examination. No other signs of definite metastatic disease elsewhere in the abdomen or pelvis. 2. Multiple small pulmonary nodules, stable compared to the prior examination, favored to be benign. No definitive findings to suggest metastatic disease to the thorax. 3. Aortic atherosclerosis. 4. Additional incidental findings, as above.   08/13/2021 Imaging   EXAM: CT CHEST, ABDOMEN, AND PELVIS  WITH CONTRAST  IMPRESSION: 1. A previously seen PET avid lesion of the anterior left lobe of the liver, hepatic segment II, is no longer discretely appreciable consistent with treatment response of a hepatic metastasis. 2. No evidence of new metastatic disease in the chest, abdomen, or pelvis. 3. Interval increase in a small focus of consolidation and nodularity of the medial left upper  lobe, consistent with minimal, ongoing atypical infection. Additional tiny bilateral pulmonary nodules are stable and almost certainly incidental benign. Attention on follow-up. 4. Status post right hemicolectomy and ileocolic anastomosis.   07/02/2022 Imaging    IMPRESSION: CHEST IMPRESSION:   1. No evidence of thoracic metastasis. 2. Stable small pulmonary nodules.   PELVIS IMPRESSION:   1. Stable to slight decrease in size of subcapsular lesion in the RIGHT hepatic lobe. 2. No evidence of new or progressive disease in the abdomen pelvis. 3.  Aortic Atherosclerosis (ICD10-I70.0).     12/31/2022 Imaging    IMPRESSION: 1. Right hepatic lobe metastasis has undergone mild-to-moderate enlargement since 10/07/2022. A left hepatic lobe 5 mm lesion is not readily apparent on the prior, suspicious for a new metastasis. 2. Similar nonspecific tiny pulmonary nodules. 3. Similar amorphous soft tissue thickening within the posterior superior right hemipelvis, hypermetabolic on prior PET and indeterminate for residual disease versus scarring. 4. Similar equivocal soft tissue fullness within the rectum. 5. Increased size of an abdominopelvic ventral wall nodule for which subcutaneous metastasis are a concern. 6. New trace right pelvic fluid. 7. Incidental findings, including: Aortic Atherosclerosis (ICD10-I70.0). 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 - Patent side-to-side ileo-colonic anastomosis, characterized by healthy appearing mucosa. - The examined portion of the ileum was normal. - One diminutive polyp in the proximal transverse colon, removed with a cold biopsy forceps. Resected and retrieved. - Likely  malignant partially obstructing tumor in the mid rectum. Biopsied. Tattooed. - The examination was otherwise normal on direct and retroflexion views.   06/15/2020 Initial Biopsy   Diagnosis 1. Transverse Colon Polyp - HYPERPLASTIC POLYP 2. Rectum, biopsy - ADENOCARCINOMA ARISING IN A TUBULAR ADENOMA WITH HIGH-GRADE DYSPLASIA. SEE NOTE Diagnosis Note 2. Dr. Luisa Hart reviewed the case and concurs with the diagnosis. Dr. [name] was notified on 06/16/2020.   06/28/2020 Imaging   CT CAP  IMPRESSION: 1. New low-density focus in the anterior aspect of the lateral segment LEFT hepatic lobe measuring 1.2 x 1.0 cm, compatible with small metastatic lesion in the LEFT hepatic lobe. 2. Soft tissue in the RIGHT iliac fossa following RIGHT hemicolectomy invades the psoas musculature and is slowly enlarging over time, more linear on the prior study now highly concerning for recurrence/metastasis to this location. 3. Signs of enteritis, potentially post radiation changes of the small bowel. Tethered small bowel in the RIGHT lower quadrant shows focal thickening and narrowing suspicious for small bowel involvement and developing partial obstruction though currently contrast passes beyond this point into the colon. 4. Rectal thickening in this patient with known rectal mass as described. 5. No evidence of metastatic disease in the chest. 6. Stable small pulmonary nodules. 7.  and aortic atherosclerosis.   Aortic Atherosclerosis (ICD10-I70.0) and Emphysema (ICD10-J43.9).   07/04/2020 Initial Diagnosis   Rectal cancer metastasized to liver [facility])   07/12/2020 PET scan   IMPRESSION:  1. Exam positive for FDG avid rectal tumor which corresponds to the recent colonoscopy findings. 2. FDG avid soft tissue mass within the right iliac fossa is noted and consistent with local tumor recurrence from previous ascending colon tumor. 3. Lateral segment left lobe of liver lesion is FDG avid concerning for liver  metastasis. 4. No specific findings identified to suggest metastatic disease to the chest.   08/10/2020 -  Chemotherapy   First-line FOLFIRI q2weeks starting 08/10/20. dose reduced with cycle 1. Irinotecan/5FU increased and Bevacizumab added with cycle 2 on 08/23/2020    08/17/2020 - 03/09/2022 Chemotherapy   Patient is on Treatment Plan : COLORECTAL FOLFIRI + Bevacizumab q14d     10/27/2020 Imaging   CT CAP  IMPRESSION: 1. Interval decrease in size of the hypermetabolic left hepatic lesion, consistent with metastatic disease. No new liver lesion evident. 2. Interval resolution of the hypermetabolic soft tissue lesion along the right iliac fossa with no measurable soft tissue lesion remaining at this location today. 3. Similar appearance of soft tissue fullness in the rectum at the site of the hypermetabolic lesion seen previously. 4. Stable tiny bilateral pulmonary nodules. Continued attention on follow-up recommended. 5. Small volume free fluid in the pelvis. 6. Aortic Atherosclerosis (ICD10-I70.0).   01/22/2021 Imaging   CT CAP  IMPRESSION: CT CHEST IMPRESSION   1. Similar nonspecific pulmonary nodules. 2. New posterior left upper lobe reticulonodular opacity, suspicious for interval mild infection or inflammation. 3. No thoracic adenopathy.   CT ABDOMEN AND PELVIS IMPRESSION   1. Further decrease in size of high left hepatic lobe 3 mm low-density lesion. No new or progressive metastatic disease within the abdomen or pelvis. 2. Similar trace free pelvic fluid. 3. Similar nonspecific mid rectal wall thickening. 4.  Aortic Atherosclerosis (ICD10-I70.0).   04/23/2021 Imaging   CT CAP  IMPRESSION: 1. Treated metastatic lesion between segments 2 and 3 of the liver, slightly smaller and less distinct than prior examination. No other signs of definite metastatic disease elsewhere in the abdomen or pelvis. 2. Multiple small pulmonary nodules, stable compared to the  prior examination, favored to be benign. No definitive findings to suggest metastatic disease to the thorax. 3. Aortic atherosclerosis. 4. Additional incidental findings, as above.   08/13/2021 Imaging   EXAM: CT CHEST, ABDOMEN, AND PELVIS WITH CONTRAST  IMPRESSION: 1. A previously seen PET avid lesion of the anterior left lobe of the liver, hepatic segment II, is no longer discretely appreciable consistent with treatment response of a hepatic metastasis. 2. No evidence of new metastatic disease in the chest, abdomen, or pelvis. 3. Interval increase in a small focus of consolidation and nodularity of the medial left upper lobe, consistent with minimal, ongoing atypical infection. Additional tiny bilateral pulmonary nodules are stable and almost certainly incidental benign. Attention on follow-up. 4. Status post right hemicolectomy and ileocolic anastomosis.   03/28/2022 -  Chemotherapy   Patient is on Treatment Plan : COLORECTAL FOLFOX + Bevacizumab q14d     07/02/2022 Imaging    IMPRESSION: CHEST IMPRESSION:   1. No evidence of thoracic metastasis. 2. Stable small pulmonary nodules.   PELVIS IMPRESSION:   1. Stable to slight decrease in size of subcapsular lesion in the RIGHT hepatic lobe. 2. No evidence of new or progressive disease in the abdomen pelvis.   10/07/2022 Imaging    IMPRESSION: 1. Stable hypovascular mass in the periphery of the right lobe of the liver, which likely represents a treated metastatic lesion. Stable subcentimeter lesion just medial  to this is also similar to the recent prior examination. No new hepatic lesions are otherwise noted. 2. Persistent but stable poorly defined soft tissue thickening in the right lower quadrant associated with multiple small bowel loops and the overlying iliopsoas musculature, which corresponds to focal hypermetabolism on remote prior PET-CT. Given the stability, this likely represents a treated metastatic lesion.  Continued attention on follow-up studies is recommended. 3. Persistent mass-like thickening in the proximal rectum corresponding to previously noted hypermetabolic rectal neoplasm on prior PET-CT. This currently measures approximately 2.8 x 2.3 cm and is slightly more apparent than the most recent prior study. 4. Multiple small pulmonary nodules generally stable compared to the prior study, with exception of a new branching nodule in the anterior aspect of the left upper lobe measuring 7 x 3 mm (mean diameter 5 mm). This is nonspecific. Close attention on follow-up studies is recommended to ensure stability. 5. Aortic atherosclerosis. 6. Additional incidental findings, as above.     12/31/2022 Imaging    IMPRESSION: 1. Right hepatic lobe metastasis has undergone mild-to-moderate enlargement since 10/07/2022. A left hepatic lobe 5 mm lesion is not readily apparent on the prior, suspicious for a new metastasis. 2. Similar nonspecific tiny pulmonary nodules. 3. Similar amorphous soft tissue thickening within the posterior superior right hemipelvis, hypermetabolic on prior PET and indeterminate for residual disease versus scarring. 4. Similar equivocal soft tissue fullness within the rectum. 5. Increased size of an abdominopelvic ventral wall nodule for which subcutaneous metastasis are a concern. 6. New trace right pelvic fluid. 7. Incidental findings, including: Aortic Atherosclerosis (ICD10-I70.0). Possible constipation      INTERVAL HISTORY:  Brittney Spencer is here for a follow up of metastatic rectal cancer, h/o colon cancer.  She was last seen NP on 8/22. She presents to the clinic alone. She had issues with cold sensitivity after last chemo. Recovered after a few days.      All other systems were reviewed with the patient and are negative.  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 Some residual intraretinal fluid remains, very small localized subfoveal of fluid remains although this continues to slowly resorb. We'll continue to observe.   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  ALLERGIES:  is allergic to venofer [iron sucrose], fish allergy, peanut-containing drug products, soy allergy, and buspirone.  MEDICATIONS:  Current Outpatient Medications  Medication Sig Dispense Refill   ALPRAZolam (XANAX) 0.25 MG tablet Take 1 tablet (0.25 mg total) by mouth daily as needed for anxiety. 30 tablet 0   Cholecalciferol (VITAMIN D3 PO) Take by mouth daily.     docusate sodium (COLACE) 100 MG capsule 1 capsule as needed     gabapentin (NEURONTIN) 100 MG capsule Take 1 capsule (100 mg total) by mouth at bedtime. 30 capsule 1   lidocaine-prilocaine (EMLA) cream Apply 1 Application  topically as needed. 30 g 1   NORVASC 2.5 MG tablet TAKE 1 TABLET BY MOUTH EVERY DAY 90 tablet 1   ondansetron (ZOFRAN) 8 MG tablet Take 1 tablet (8 mg total) by mouth every 8 (eight) hours as needed for nausea or vomiting. 20 tablet 2   prochlorperazine (COMPAZINE) 10 MG tablet Take 1 tablet (10 mg total) by mouth every 6 (six) hours as needed for nausea or vomiting. 30 tablet 2   Current Facility-Administered Medications  Medication Dose Route Frequency Provider Last Rate Last Admin   0.9 %  sodium chloride infusion  500 mL Intravenous Once Charlie Pitter III, MD        PHYSICAL EXAMINATION: ECOG PERFORMANCE STATUS: 1 - Symptomatic but completely ambulatory  Vitals:   02/27/23 0934  BP: 138/78  Pulse: 87  Resp: 18  Temp: 98.5 F (36.9 C)  SpO2: 100%    Wt Readings from Last 3 Encounters:  02/27/23 105 lb 3.2 oz (47.7 kg)  02/20/23 103 lb 11.2 oz (47 kg)  02/12/23 105 lb 12.8 oz (48 kg)     GENERAL:alert, no distress and comfortable SKIN: skin color normal, no rashes or significant lesions EYES: normal, Conjunctiva are pink and non-injected, sclera clear  NEURO: alert & oriented x 3 with fluent speech  LABORATORY DATA:  I have reviewed the data as listed    Latest Ref Rng & Units 02/27/2023    8:53 AM 02/12/2023    9:56 AM 01/09/2023    8:02 AM  CBC  WBC 4.0 - 10.5 K/uL 4.3  2.7  3.5   Hemoglobin 12.0 - 15.0 g/dL 16.1  09.6  04.5   Hematocrit 36.0 - 46.0 % 33.0  32.2  32.8   Platelets 150 - 400 K/uL 109  109  137         Latest Ref Rng & Units 02/27/2023    8:53 AM 02/12/2023    9:56 AM 01/09/2023    8:02 AM  CMP  Glucose 70 - 99 mg/dL 89  92  75   BUN 8 - 23 mg/dL 14  14  16    Creatinine 0.44 - 1.00 mg/dL 4.09  8.11  9.14   Sodium 135 - 145 mmol/L 137  138  137   Potassium 3.5 - 5.1 mmol/L 4.0  4.2  4.4   Chloride 98 - 111 mmol/L 104  105  104   CO2 22 - 32 mmol/L 27  30  28    Calcium 8.9 - 10.3 mg/dL 8.7  8.5  8.9   Total Protein 6.5 - 8.1 g/dL 7.3  7.0   7.1   Total Bilirubin 0.3 - 1.2 mg/dL 0.3  0.4  0.4   Alkaline Phos 38 - 126 U/L 91  87  80   AST 15 - 41 U/L 19  21  18  ALT 0 - 44 U/L 9  10  8        RADIOGRAPHIC STUDIES: I have personally reviewed the radiological images as listed and agreed with the findings in the report. No results found.    Orders Placed This Encounter  Procedures   CBC with Differential (Cancer Center Only)    Standing Status:   Future    Standing Expiration Date:   03/26/2024   CMP (Cancer Center only)    Standing Status:   Future    Standing Expiration Date:   03/26/2024   Total Protein, Urine dipstick    Standing Status:   Future    Standing Expiration Date:   03/26/2024   CBC with Differential (Cancer Center Only)    Standing Status:   Future    Standing Expiration Date:   04/09/2024   CMP (Cancer Center only)    Standing Status:   Future    Standing Expiration Date:   04/09/2024   Total Protein, Urine dipstick    Standing Status:   Future    Standing Expiration Date:   04/09/2024   CBC with Differential (Cancer Center Only)    Standing Status:   Future    Standing Expiration Date:   04/23/2024   CMP (Cancer Center only)    Standing Status:   Future    Standing Expiration Date:   04/23/2024   Total Protein, Urine dipstick    Standing Status:   Future    Standing Expiration Date:   04/23/2024   All questions were answered. The patient knows to call the clinic with any problems, questions or concerns. No barriers to learning was detected. The total time spent in the appointment was 25 minutes.     [name], MD 02/27/2023

## 2023-02-28 ENCOUNTER — Other Ambulatory Visit: Payer: Self-pay

## 2023-03-01 ENCOUNTER — Inpatient Hospital Stay: Payer: Federal, State, Local not specified - PPO

## 2023-03-01 ENCOUNTER — Other Ambulatory Visit: Payer: Self-pay

## 2023-03-01 VITALS — BP 142/88 | HR 94 | Temp 97.0°F | Resp 17

## 2023-03-01 DIAGNOSIS — Z5112 Encounter for antineoplastic immunotherapy: Secondary | ICD-10-CM | POA: Diagnosis not present

## 2023-03-01 DIAGNOSIS — C20 Malignant neoplasm of rectum: Secondary | ICD-10-CM

## 2023-03-01 MED ORDER — PEGFILGRASTIM-JMDB 6 MG/0.6ML ~~LOC~~ SOSY
6.0000 mg | PREFILLED_SYRINGE | Freq: Once | SUBCUTANEOUS | Status: AC
  Administered 2023-03-01: 6 mg via SUBCUTANEOUS

## 2023-03-01 MED ORDER — HEPARIN SOD (PORK) LOCK FLUSH 100 UNIT/ML IV SOLN
500.0000 [IU] | Freq: Once | INTRAVENOUS | Status: AC | PRN
  Administered 2023-03-01: 500 [IU]

## 2023-03-01 MED ORDER — SODIUM CHLORIDE 0.9% FLUSH
10.0000 mL | INTRAVENOUS | Status: DC | PRN
  Administered 2023-03-01: 10 mL

## 2023-03-01 NOTE — Patient Instructions (Signed)
[facility]  Discharge Instructions: Thank you for choosing [facility] to provide your oncology and hematology care.   If you have a lab appointment with the Cancer Center, please go directly to the Cancer Center and check in at the registration area.   Wear comfortable clothing and clothing appropriate for easy access to any Portacath or PICC line.   We strive to give you quality time with your provider. You may need to reschedule your appointment if you arrive late (15 or more minutes).  Arriving late affects you and other patients whose appointments are after yours.  Also, if you miss three or more appointments without notifying the office, you may be dismissed from the clinic at the provider's discretion.      For prescription refill requests, have your pharmacy contact our office and allow 72 hours for refills to be completed.    Today you received the following chemotherapy and/or immunotherapy agents: Fulphila      To help prevent nausea and vomiting after your treatment, we encourage you to take your nausea medication as directed.  BELOW ARE SYMPTOMS THAT SHOULD BE REPORTED IMMEDIATELY: *FEVER GREATER THAN 100.4 F (38 C) OR HIGHER *CHILLS OR SWEATING *NAUSEA AND VOMITING THAT IS NOT CONTROLLED WITH YOUR NAUSEA MEDICATION *UNUSUAL SHORTNESS OF BREATH *UNUSUAL BRUISING OR BLEEDING *URINARY PROBLEMS (pain or burning when urinating, or frequent urination) *BOWEL PROBLEMS (unusual diarrhea, constipation, pain near the anus) TENDERNESS IN MOUTH AND THROAT WITH OR WITHOUT PRESENCE OF ULCERS (sore throat, sores in mouth, or a toothache) UNUSUAL RASH, SWELLING OR PAIN  UNUSUAL VAGINAL DISCHARGE OR ITCHING   Items with * indicate a potential emergency and should be followed up as soon as possible or go to the Emergency Department if any problems should occur.  Please show the CHEMOTHERAPY ALERT CARD or IMMUNOTHERAPY ALERT CARD at  check-in to the Emergency Department and triage nurse.  Should you have questions after your visit or need to cancel or reschedule your appointment, please contact [facility]  Dept: [phone]  and follow the prompts.  Office hours are 8:00 a.m. to 4:30 p.m. Monday - Friday. Please note that voicemails left after 4:00 p.m. may not be returned until the following business day.  We are closed weekends and major holidays. You have access to a nurse at all times for urgent questions. Please call the main number to the clinic Dept: [phone] and follow the prompts.   For any non-urgent questions, you may also contact your provider using MyChart. We now offer e-Visits for anyone 76 and older to request care online for non-urgent symptoms. For details visit [email or web address].   Also download the MyChart app! Go to the app store, search "MyChart", open the app, select , and log in with your MyChart username and password.

## 2023-03-06 ENCOUNTER — Ambulatory Visit: Payer: Federal, State, Local not specified - PPO | Admitting: Hematology

## 2023-03-06 ENCOUNTER — Other Ambulatory Visit: Payer: Federal, State, Local not specified - PPO

## 2023-03-06 ENCOUNTER — Ambulatory Visit: Payer: Federal, State, Local not specified - PPO

## 2023-03-12 MED FILL — Dexamethasone Sodium Phosphate Inj 100 MG/10ML: INTRAMUSCULAR | Qty: 1 | Status: AC

## 2023-03-13 ENCOUNTER — Inpatient Hospital Stay: Payer: Federal, State, Local not specified - PPO | Attending: Nurse Practitioner

## 2023-03-13 ENCOUNTER — Encounter: Payer: Self-pay | Admitting: Hematology

## 2023-03-13 ENCOUNTER — Inpatient Hospital Stay (HOSPITAL_BASED_OUTPATIENT_CLINIC_OR_DEPARTMENT_OTHER): Payer: Federal, State, Local not specified - PPO | Admitting: Hematology

## 2023-03-13 ENCOUNTER — Inpatient Hospital Stay: Payer: Federal, State, Local not specified - PPO

## 2023-03-13 VITALS — BP 130/77 | HR 82 | Temp 98.5°F | Resp 17 | Wt 105.4 lb

## 2023-03-13 VITALS — BP 120/71 | HR 84 | Ht 66.0 in

## 2023-03-13 DIAGNOSIS — T451X5D Adverse effect of antineoplastic and immunosuppressive drugs, subsequent encounter: Secondary | ICD-10-CM | POA: Diagnosis not present

## 2023-03-13 DIAGNOSIS — R911 Solitary pulmonary nodule: Secondary | ICD-10-CM | POA: Insufficient documentation

## 2023-03-13 DIAGNOSIS — C787 Secondary malignant neoplasm of liver and intrahepatic bile duct: Secondary | ICD-10-CM

## 2023-03-13 DIAGNOSIS — Z95828 Presence of other vascular implants and grafts: Secondary | ICD-10-CM

## 2023-03-13 DIAGNOSIS — C20 Malignant neoplasm of rectum: Secondary | ICD-10-CM | POA: Diagnosis present

## 2023-03-13 DIAGNOSIS — Z5112 Encounter for antineoplastic immunotherapy: Secondary | ICD-10-CM | POA: Insufficient documentation

## 2023-03-13 DIAGNOSIS — Z5111 Encounter for antineoplastic chemotherapy: Secondary | ICD-10-CM | POA: Insufficient documentation

## 2023-03-13 DIAGNOSIS — C182 Malignant neoplasm of ascending colon: Secondary | ICD-10-CM

## 2023-03-13 DIAGNOSIS — Z5189 Encounter for other specified aftercare: Secondary | ICD-10-CM | POA: Diagnosis not present

## 2023-03-13 DIAGNOSIS — G620 Drug-induced polyneuropathy: Secondary | ICD-10-CM | POA: Insufficient documentation

## 2023-03-13 DIAGNOSIS — Z1509 Genetic susceptibility to other malignant neoplasm: Secondary | ICD-10-CM | POA: Insufficient documentation

## 2023-03-13 DIAGNOSIS — Z9049 Acquired absence of other specified parts of digestive tract: Secondary | ICD-10-CM | POA: Diagnosis not present

## 2023-03-13 DIAGNOSIS — Z923 Personal history of irradiation: Secondary | ICD-10-CM | POA: Diagnosis not present

## 2023-03-13 DIAGNOSIS — D500 Iron deficiency anemia secondary to blood loss (chronic): Secondary | ICD-10-CM

## 2023-03-13 LAB — CMP (CANCER CENTER ONLY)
ALT: 7 U/L (ref 0–44)
AST: 17 U/L (ref 15–41)
Albumin: 3.4 g/dL — ABNORMAL LOW (ref 3.5–5.0)
Alkaline Phosphatase: 95 U/L (ref 38–126)
Anion gap: 4 — ABNORMAL LOW (ref 5–15)
BUN: 9 mg/dL (ref 8–23)
CO2: 29 mmol/L (ref 22–32)
Calcium: 8.7 mg/dL — ABNORMAL LOW (ref 8.9–10.3)
Chloride: 103 mmol/L (ref 98–111)
Creatinine: 0.74 mg/dL (ref 0.44–1.00)
GFR, Estimated: >60 mL/min (ref >60)
Glucose, Bld: 98 mg/dL (ref 70–99)
Potassium: 4 mmol/L (ref 3.5–5.1)
Sodium: 136 mmol/L (ref 135–145)
Total Bilirubin: 0.4 mg/dL (ref 0.3–1.2)
Total Protein: 6.8 g/dL (ref 6.5–8.1)

## 2023-03-13 LAB — CBC WITH DIFFERENTIAL (CANCER CENTER ONLY)
Abs Immature Granulocytes: 0.03 10*3/uL (ref 0.00–0.07)
Basophils Absolute: 0 10*3/uL (ref 0.0–0.1)
Basophils Relative: 0 %
Eosinophils Absolute: 0.1 10*3/uL (ref 0.0–0.5)
Eosinophils Relative: 1 %
HCT: 33.2 % — ABNORMAL LOW (ref 36.0–46.0)
Hemoglobin: 10.3 g/dL — ABNORMAL LOW (ref 12.0–15.0)
Immature Granulocytes: 1 %
Lymphocytes Relative: 18 %
Lymphs Abs: 0.9 10*3/uL (ref 0.7–4.0)
MCH: 28.5 pg (ref 26.0–34.0)
MCHC: 31 g/dL (ref 30.0–36.0)
MCV: 91.7 fL (ref 80.0–100.0)
Monocytes Absolute: 0.6 10*3/uL (ref 0.1–1.0)
Monocytes Relative: 11 %
Neutro Abs: 3.4 10*3/uL (ref 1.7–7.7)
Neutrophils Relative %: 69 %
Platelet Count: 113 10*3/uL — ABNORMAL LOW (ref 150–400)
RBC: 3.62 MIL/uL — ABNORMAL LOW (ref 3.87–5.11)
RDW: 13.4 % (ref 11.5–15.5)
WBC Count: 4.9 10*3/uL (ref 4.0–10.5)
nRBC: 0 % (ref 0.0–0.2)

## 2023-03-13 LAB — FERRITIN: Ferritin: 100 ng/mL (ref 11–307)

## 2023-03-13 LAB — TOTAL PROTEIN, URINE DIPSTICK: Protein, ur: NEGATIVE mg/dL

## 2023-03-13 MED ORDER — SODIUM CHLORIDE 0.9 % IV SOLN: Freq: Once | INTRAVENOUS | Status: AC

## 2023-03-13 MED ORDER — DEXTROSE 5 % IV SOLN: Freq: Once | INTRAVENOUS | Status: AC

## 2023-03-13 MED ORDER — SODIUM CHLORIDE 0.9 % IV SOLN
5.0000 mg/kg | Freq: Once | INTRAVENOUS | Status: AC
  Administered 2023-03-13: 250 mg via INTRAVENOUS
  Filled 2023-03-13: qty 10

## 2023-03-13 MED ORDER — PALONOSETRON HCL INJECTION 0.25 MG/5ML
0.2500 mg | Freq: Once | INTRAVENOUS | Status: AC
  Administered 2023-03-13: 0.25 mg via INTRAVENOUS
  Filled 2023-03-13: qty 5

## 2023-03-13 MED ORDER — SODIUM CHLORIDE 0.9 % IV SOLN
10.0000 mg | Freq: Once | INTRAVENOUS | Status: AC
  Administered 2023-03-13: 10 mg via INTRAVENOUS
  Filled 2023-03-13: qty 10

## 2023-03-13 MED ORDER — HEPARIN SOD (PORK) LOCK FLUSH 100 UNIT/ML IV SOLN: 500.0000 [IU] | Freq: Once | INTRAVENOUS | Status: DC | PRN

## 2023-03-13 MED ORDER — SODIUM CHLORIDE 0.9 % IV SOLN
1800.0000 mg/m2 | INTRAVENOUS | Status: DC
  Administered 2023-03-13: 2500 mg via INTRAVENOUS
  Filled 2023-03-13: qty 50

## 2023-03-13 MED ORDER — SODIUM CHLORIDE 0.9% FLUSH: 10.0000 mL | INTRAVENOUS | Status: DC | PRN

## 2023-03-13 MED ORDER — SODIUM CHLORIDE 0.9% FLUSH
10.0000 mL | Freq: Once | INTRAVENOUS | Status: AC
  Administered 2023-03-13: 10 mL

## 2023-03-13 MED ORDER — OXALIPLATIN CHEMO INJECTION 100 MG/20ML
50.0000 mg/m2 | Freq: Once | INTRAVENOUS | Status: AC
  Administered 2023-03-13: 75 mg via INTRAVENOUS
  Filled 2023-03-13: qty 15

## 2023-03-13 MED ORDER — LEUCOVORIN CALCIUM INJECTION 350 MG
400.0000 mg/m2 | Freq: Once | INTRAVENOUS | Status: AC
  Administered 2023-03-13: 600 mg via INTRAVENOUS
  Filled 2023-03-13: qty 30

## 2023-03-13 MED ORDER — LORATADINE 10 MG PO TABS
10.0000 mg | ORAL_TABLET | Freq: Every day | ORAL | Status: DC
  Administered 2023-03-13: 10 mg via ORAL
  Filled 2023-03-13: qty 1

## 2023-03-13 MED ORDER — FAMOTIDINE IN NACL 20-0.9 MG/50ML-% IV SOLN
20.0000 mg | Freq: Once | INTRAVENOUS | Status: AC
  Administered 2023-03-13: 20 mg via INTRAVENOUS
  Filled 2023-03-13: qty 50

## 2023-03-13 NOTE — Progress Notes (Signed)
[facility]   [phone] [phone]   Clinic Follow up Note   Patient Care Team: Patient, No Pcp Per as PCP - General (General Practice) [name], NP as Nurse Practitioner (Oncology) [name], MD as Consulting Physician (Oncology) [name], RN (Inactive) as Oncology Nurse Navigator  Date of Service:  03/13/2023  CHIEF COMPLAINT: f/u of  metastatic rectal cancer, h/o colon cancer        CURRENT THERAPY:   FOLFOX with bevacizumab, q14d, starting 03/28/22     ASSESSMENT:  Brittney Spencer is a 72 y.o. 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  -She began first-line chemo with dose-reduced FOLFIRI and Beva in 2/22.  -we changed to FOLFOX with continued Beva on 03/28/22. She tolerated well overall with some fatigue and cold sensitivity. -I personally reviewed her restaging CT from 10/07/2022 which showed overall SD in liver and rectum, stable and indermininate lung nodules, no other new lesions  -She is tolerating chemotherapy overall well, will continue. -I personally reviewed her restaging CT scan from October 07, 2022, which showed overall stable disease.  She has multiple small lung nodules, indeterminate, including one 7 mm nodule, will continue monitoring. 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 -Plan to repeat a restaging CT scan in Oct  Cancer of ascending colon (HCC) diagnosed in 12/2015. S/p right hemicolectomy with right salpingo oophorectomy and adjuvant ChemoRT. -Her 04/2019 CT scan was NED  -With  rectal cancer diagnosis in 2022, her PET from 07/12/20 indicates soft tissue mass within the right iliac fossa is noted and consistent with local tumor recurrence from previous ascending colon tumor.    PLAN: -lab reviewed -CMP -pending -Proceed with treatment today -post pone treatment 9/26 due to pt Birthday I-I request treatment to resume 10/3 -lab/flush and treatment in 3 weeks  SUMMARY OF ONCOLOGIC HISTORY: Oncology History Overview Note  Cancer Staging Cancer of ascending colon [facility]) Staging form: Colon and Rectum, AJCC 7th Edition - Clinical stage from 02/07/2016: Stage IIIC (T4b, N1b, M0) - Signed by [name], MD on 03/04/2016 Laterality: Right Residual tumor (R): R2 - Macroscopic    Cancer of ascending colon (HCC)  10/25/2015 Imaging   CT ABD/PELVIS:  Inflammatory changes inferior to the cecal tip appear improved, there is still irregular soft tissue thickening of the cecal tip, and there are adjacent prominent lymph nodes in the ileocolonic mesentery, measuring 13 mm on image 49 and 8 mm on image 52. In addition, there is a 2.5 x 1.8 cm nodule on image 46 which has central low density. Therefore, these findings are moderately suspicious for an underlying cecal malignancy with perforation.    01/19/2016 Procedure   COLONOSCOPY per Dr. [name]: Fungating, ulcerated mass almost obstructing mid ascending colon   01/19/2016 Initial Biopsy   Diagnosis Surgical [P], cecal mass - INVASIVE ADENOCARCINOMA WITH ULCERATION. - SEE COMMENT.   02/05/2016 Tumor Marker   Patient's tumor was tested for the following markers: CEA Results of the tumor marker test revealed 5.7.   02/07/2016 Initial Diagnosis   Cancer of ascending colon (HCC)  02/07/2016 Definitive Surgery   Laparoscopic assisted right hemicolectomy and right salpingo oopherectomy--[name]   02/07/2016 Pathologic Stage   p T4 N1b   2/43 nodes +   02/07/2016 Pathology Results   MMR normal; G2 adenocarcinoma;proximal &  distal margins negative; soft tissue mass on pelvic sidewall + for adenocarcinoma with positive margin MSI Stable   03/08/2016 Imaging   CT chest negative for metastasis.    03/19/2016 - 04/25/2016 Radiation Therapy   Adjuvant irradiation, 50 gray in 28 fractions   03/19/2016 - 04/22/2016 Chemotherapy   Xeloda 1500 mg twice daily, started on 03/19/2016, dose reduced to 1000 mg twice daily from week 3 due to neutropenia, and patient stopped 3 days before last dose radiation due to difficulty swallowing the pill    05/20/2016 -  Adjuvant Chemotherapy   Patient declined adjuvant chemotherapy   09/16/2016 Imaging   CT CAP w Contrast 1. No evidence of local tumor recurrence at the ileocolic anastomosis. 2. No findings suspicious for metastatic disease in the chest, abdomen or pelvis. 3. Nonspecific trace free fluid in the pelvic cul-de-sac. 4. Stable solitary 3 mm right upper lobe pulmonary nodule, for which 6 month stability has been demonstrated, probably benign. 5. Additional findings include stable right posterior pericardial cyst and small calcified uterine fibroids.   05/13/2017 Imaging   CT CAP W Contrast 05/13/17 IMPRESSION: 1. No current findings of residual or recurrent malignancy. 2. Mild prominence of stool throughout the colon. Nondistended portions of the rectum. 3. Several tiny pulmonary nodules are stable from the earliest available comparison of 03/08/2016 and probably benign, but may merit surveillance. 4. Other imaging findings of potential clinical significance: Old granulomatous disease. Aortoiliac atherosclerotic vascular disease. Lumbar spondylosis and degenerative disc disease. Stable amount of trace free pelvic fluid.   04/27/2018 Imaging   04/27/2018 CT CAP IMPRESSION: Stable exam. No evidence of recurrent or metastatic carcinoma within the chest, abdomen, or pelvis   04/19/2019 Imaging   CT CAP W Contrast  IMPRESSION: Chest Impression:   1. No  evidence of thoracic metastasis. 2. Stable small bilateral pulmonary nodules.   Abdomen / Pelvis Impression:   1. No evidence local colorectal carcinoma recurrence or metastasis in the abdomen pelvis. 2. Post RIGHT hemicolectomy.   01/22/2021 Imaging   CT CAP  IMPRESSION: CT CHEST IMPRESSION   1. Similar nonspecific pulmonary nodules. 2. New posterior left upper lobe reticulonodular opacity, suspicious for interval mild infection or inflammation. 3. No thoracic adenopathy.   CT ABDOMEN AND PELVIS IMPRESSION   1. Further decrease in size of high left hepatic lobe 3 mm low-density lesion. No new or progressive metastatic disease within the abdomen or pelvis. 2. Similar trace free pelvic fluid. 3. Similar nonspecific mid rectal wall thickening. 4.  Aortic Atherosclerosis (ICD10-I70.0).   04/23/2021 Imaging   CT CAP  IMPRESSION: 1. Treated metastatic lesion between segments 2 and 3 of the liver, slightly smaller and less distinct than prior examination. No other signs of definite metastatic disease elsewhere in the abdomen or pelvis. 2. Multiple small pulmonary nodules, stable compared to the prior examination, favored to be benign. No definitive findings to suggest metastatic disease to the thorax. 3. Aortic atherosclerosis. 4. Additional incidental findings, as above.   08/13/2021 Imaging   EXAM: CT CHEST, ABDOMEN, AND PELVIS WITH CONTRAST  IMPRESSION: 1. A previously seen PET avid lesion of the anterior left lobe of the liver, hepatic segment II, is no longer discretely appreciable consistent with treatment response of a hepatic metastasis. 2.  No evidence of new metastatic disease in the chest, abdomen, or pelvis. 3. Interval increase in a small focus of consolidation and nodularity of the medial left upper lobe, consistent with minimal, ongoing atypical infection. Additional tiny bilateral pulmonary nodules are stable and almost certainly incidental benign.  Attention on follow-up. 4. Status post right hemicolectomy and ileocolic anastomosis.   07/02/2022 Imaging    IMPRESSION: CHEST IMPRESSION:   1. No evidence of thoracic metastasis. 2. Stable small pulmonary nodules.   PELVIS IMPRESSION:   1. Stable to slight decrease in size of subcapsular lesion in the RIGHT hepatic lobe. 2. No evidence of new or progressive disease in the abdomen pelvis. 3.  Aortic Atherosclerosis (ICD10-I70.0).     12/31/2022 Imaging    IMPRESSION: 1. Right hepatic lobe metastasis has undergone mild-to-moderate enlargement since 10/07/2022. A left hepatic lobe 5 mm lesion is not readily apparent on the prior, suspicious for a new metastasis. 2. Similar nonspecific tiny pulmonary nodules. 3. Similar amorphous soft tissue thickening within the posterior superior right hemipelvis, hypermetabolic on prior PET and indeterminate for residual disease versus scarring. 4. Similar equivocal soft tissue fullness within the rectum. 5. Increased size of an abdominopelvic ventral wall nodule for which subcutaneous metastasis are a concern. 6. New trace right pelvic fluid. 7. Incidental findings, including: Aortic Atherosclerosis (ICD10-I70.0). 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 - Patent side-to-side ileo-colonic anastomosis, characterized by healthy appearing mucosa. - The examined portion of the ileum was normal. - One diminutive polyp in the proximal transverse colon, removed with a cold biopsy forceps. Resected and retrieved. - Likely malignant partially obstructing tumor in the mid rectum. Biopsied. Tattooed. - The examination was otherwise normal on direct and retroflexion views.    06/15/2020 Initial Biopsy   Diagnosis 1. Transverse Colon Polyp - HYPERPLASTIC POLYP 2. Rectum, biopsy - ADENOCARCINOMA ARISING IN A TUBULAR ADENOMA WITH HIGH-GRADE DYSPLASIA. SEE NOTE Diagnosis Note 2. Dr. Luisa Hart reviewed the case and concurs with the diagnosis. Dr. [name] was notified on 06/16/2020.   06/28/2020 Imaging   CT CAP  IMPRESSION: 1. New low-density focus in the anterior aspect of the lateral segment LEFT hepatic lobe measuring 1.2 x 1.0 cm, compatible with small metastatic lesion in the LEFT hepatic lobe. 2. Soft tissue in the RIGHT iliac fossa following RIGHT hemicolectomy invades the psoas musculature and is slowly enlarging over time, more linear on the prior study now highly concerning for recurrence/metastasis to this location. 3. Signs of enteritis, potentially post radiation changes of the small bowel. Tethered small bowel in the RIGHT lower quadrant shows focal thickening and narrowing suspicious for small bowel involvement and developing partial obstruction though currently contrast passes beyond this point into the colon. 4. Rectal thickening in this patient with known rectal mass as described. 5. No evidence of metastatic disease in the chest. 6. Stable small pulmonary nodules. 7.  and aortic atherosclerosis.   Aortic Atherosclerosis (ICD10-I70.0) and Emphysema (ICD10-J43.9).   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 Exam positive for FDG avid rectal tumor which corresponds to the recent colonoscopy findings. 2. FDG avid soft tissue mass within the right iliac fossa is noted and consistent with local tumor recurrence from previous  ascending colon tumor. 3. Lateral segment left lobe of liver lesion is FDG avid concerning for liver metastasis. 4. No specific findings identified to suggest metastatic disease to the chest.   08/10/2020 -  Chemotherapy   First-line FOLFIRI q2weeks  starting 08/10/20. dose reduced with cycle 1. Irinotecan/5FU increased and Bevacizumab added with cycle 2 on 08/23/2020    08/17/2020 - 03/09/2022 Chemotherapy   Patient is on Treatment Plan : COLORECTAL FOLFIRI + Bevacizumab q14d     10/27/2020 Imaging   CT CAP  IMPRESSION: 1. Interval decrease in size of the hypermetabolic left hepatic lesion, consistent with metastatic disease. No new liver lesion evident. 2. Interval resolution of the hypermetabolic soft tissue lesion along the right iliac fossa with no measurable soft tissue lesion remaining at this location today. 3. Similar appearance of soft tissue fullness in the rectum at the site of the hypermetabolic lesion seen previously. 4. Stable tiny bilateral pulmonary nodules. Continued attention on follow-up recommended. 5. Small volume free fluid in the pelvis. 6. Aortic Atherosclerosis (ICD10-I70.0).   01/22/2021 Imaging   CT CAP  IMPRESSION: CT CHEST IMPRESSION   1. Similar nonspecific pulmonary nodules. 2. New posterior left upper lobe reticulonodular opacity, suspicious for interval mild infection or inflammation. 3. No thoracic adenopathy.   CT ABDOMEN AND PELVIS IMPRESSION   1. Further decrease in size of high left hepatic lobe 3 mm low-density lesion. No new or progressive metastatic disease within the abdomen or pelvis. 2. Similar trace free pelvic fluid. 3. Similar nonspecific mid rectal wall thickening. 4.  Aortic Atherosclerosis (ICD10-I70.0).   04/23/2021 Imaging   CT CAP  IMPRESSION: 1. Treated metastatic lesion between segments 2 and 3 of the liver, slightly smaller and less distinct than prior examination. No other signs of definite metastatic disease elsewhere in the abdomen or pelvis. 2. Multiple small pulmonary nodules, stable compared to the prior examination, favored to be benign. No definitive findings to suggest metastatic disease to the thorax. 3. Aortic atherosclerosis. 4. Additional  incidental findings, as above.   08/13/2021 Imaging   EXAM: CT CHEST, ABDOMEN, AND PELVIS WITH CONTRAST  IMPRESSION: 1. A previously seen PET avid lesion of the anterior left lobe of the liver, hepatic segment II, is no longer discretely appreciable consistent with treatment response of a hepatic metastasis. 2. No evidence of new metastatic disease in the chest, abdomen, or pelvis. 3. Interval increase in a small focus of consolidation and nodularity of the medial left upper lobe, consistent with minimal, ongoing atypical infection. Additional tiny bilateral pulmonary nodules are stable and almost certainly incidental benign. Attention on follow-up. 4. Status post right hemicolectomy and ileocolic anastomosis.   03/28/2022 -  Chemotherapy   Patient is on Treatment Plan : COLORECTAL FOLFOX + Bevacizumab q14d     07/02/2022 Imaging    IMPRESSION: CHEST IMPRESSION:   1. No evidence of thoracic metastasis. 2. Stable small pulmonary nodules.   PELVIS IMPRESSION:   1. Stable to slight decrease in size of subcapsular lesion in the RIGHT hepatic lobe. 2. No evidence of new or progressive disease in the abdomen pelvis.   10/07/2022 Imaging    IMPRESSION: 1. Stable hypovascular mass in the periphery of the right lobe of the liver, which likely represents a treated metastatic lesion. Stable subcentimeter lesion just medial to this is also similar to the recent prior examination. No new hepatic lesions are otherwise noted. 2. Persistent but stable poorly defined soft tissue thickening in the right lower quadrant associated with multiple small bowel  loops and the overlying iliopsoas musculature, which corresponds to focal hypermetabolism on remote prior PET-CT. Given the stability, this likely represents a treated metastatic lesion. Continued attention on follow-up studies is recommended. 3. Persistent mass-like thickening in the proximal rectum corresponding to previously noted  hypermetabolic rectal neoplasm on prior PET-CT. This currently measures approximately 2.8 x 2.3 cm and is slightly more apparent than the most recent prior study. 4. Multiple small pulmonary nodules generally stable compared to the prior study, with exception of a new branching nodule in the anterior aspect of the left upper lobe measuring 7 x 3 mm (mean diameter 5 mm). This is nonspecific. Close attention on follow-up studies is recommended to ensure stability. 5. Aortic atherosclerosis. 6. Additional incidental findings, as above.     12/31/2022 Imaging    IMPRESSION: 1. Right hepatic lobe metastasis has undergone mild-to-moderate enlargement since 10/07/2022. A left hepatic lobe 5 mm lesion is not readily apparent on the prior, suspicious for a new metastasis. 2. Similar nonspecific tiny pulmonary nodules. 3. Similar amorphous soft tissue thickening within the posterior superior right hemipelvis, hypermetabolic on prior PET and indeterminate for residual disease versus scarring. 4. Similar equivocal soft tissue fullness within the rectum. 5. Increased size of an abdominopelvic ventral wall nodule for which subcutaneous metastasis are a concern. 6. New trace right pelvic fluid. 7. Incidental findings, including: Aortic Atherosclerosis (ICD10-I70.0). Possible constipation      INTERVAL HISTORY:  Brittney Spencer is here for a follow up of metastatic rectal cancer, h/o colon cancer . She was last seen by me on 02/27/2023. She presents to the clinic alone. Pt state that she is doing well. Her neuropathy is still there and it has no change. She states it does not bother her when she is sleeping. Pt discontinue with physical therapy.      All other systems were reviewed with the patient and are negative.  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 Some residual intraretinal fluid remains, very small localized subfoveal of fluid remains although this continues to slowly resorb. We'll continue to observe.   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  ALLERGIES:  is allergic to venofer [iron sucrose], fish allergy, peanut-containing drug  products, soy allergy, and buspirone.  MEDICATIONS:  Current Outpatient Medications  Medication Sig Dispense Refill   ALPRAZolam (XANAX) 0.25 MG tablet Take 1 tablet (0.25 mg total) by mouth daily as needed for anxiety. 30 tablet 0   Cholecalciferol (VITAMIN D3 PO) Take by mouth daily.     docusate sodium (COLACE) 100 MG capsule 1 capsule as needed     gabapentin (NEURONTIN) 100 MG capsule Take 1 capsule (100 mg total) by mouth at bedtime. 30 capsule 1   lidocaine-prilocaine (EMLA) cream Apply 1 Application topically as needed. 30 g 1   NORVASC 2.5 MG tablet TAKE 1  TABLET BY MOUTH EVERY DAY 90 tablet 1   ondansetron (ZOFRAN) 8 MG tablet Take 1 tablet (8 mg total) by mouth every 8 (eight) hours as needed for nausea or vomiting. 20 tablet 2   prochlorperazine (COMPAZINE) 10 MG tablet Take 1 tablet (10 mg total) by mouth every 6 (six) hours as needed for nausea or vomiting. 30 tablet 2   Current Facility-Administered Medications  Medication Dose Route Frequency Provider Last Rate Last Admin   0.9 %  sodium chloride infusion  500 mL Intravenous Once Sherrilyn Rist, MD       Facility-Administered Medications Ordered in Other Visits  Medication Dose Route Frequency Provider Last Rate Last Admin   bevacizumab-bvzr (ZIRABEV) 250 mg in sodium chloride 0.9 % 100 mL chemo infusion  5 mg/kg (Order-Specific) Intravenous Once [name], MD       dexamethasone (DECADRON) 10 mg in sodium chloride 0.9 % 50 mL IVPB  10 mg Intravenous Once [name], MD       dextrose 5 % solution   Intravenous Once [name], MD       famotidine (PEPCID) IVPB 20 mg premix  20 mg Intravenous Once [name], MD 200 mL/hr at 03/13/23 1026 20 mg at 03/13/23 1026   fluorouracil (ADRUCIL) 2,500 mg in sodium chloride 0.9 % 100 mL chemo infusion  1,800 mg/m2 (Order-Specific) Intravenous 1 day or 1 dose [name], MD       heparin lock flush 100 unit/mL  500 Units Intracatheter Once PRN [name], MD       leucovorin 600 mg in dextrose 5 % 250 mL infusion  400 mg/m2 (Order-Specific) Intravenous Once [name], MD       loratadine (CLARITIN) tablet 10 mg  10 mg Oral Daily [name], MD   10 mg at 03/13/23 1021   oxaliplatin (ELOXATIN) 75 mg in dextrose 5 % 500 mL chemo infusion  50 mg/m2 (Order-Specific) Intravenous Once [name], MD       sodium chloride flush (NS) 0.9 % injection 10 mL  10 mL Intracatheter PRN [name], MD        PHYSICAL EXAMINATION: ECOG PERFORMANCE STATUS: 1 - Symptomatic but completely ambulatory  Vitals:   03/13/23 0946  BP: 130/77  Pulse: 82  Resp: 17  Temp:  98.5 F (36.9 C)  SpO2: 100%   Wt Readings from Last 3 Encounters:  03/13/23 105 lb 6.4 oz (47.8 kg)  02/27/23 105 lb 3.2 oz (47.7 kg)  02/20/23 103 lb 11.2 oz (47 kg)     GENERAL:alert, no distress and comfortable SKIN: skin color normal, no rashes or significant lesions EYES: normal, Conjunctiva are pink and non-injected, sclera clear  NEURO: alert & oriented x 3 with fluent speech LABORATORY DATA:  I have reviewed the data as listed    Latest Ref Rng & Units 03/13/2023  9:15 AM 02/27/2023    8:53 AM 02/12/2023    9:56 AM  CBC  WBC 4.0 - 10.5 K/uL 4.9  4.3  2.7   Hemoglobin 12.0 - 15.0 g/dL 78.2  95.6  21.3   Hematocrit 36.0 - 46.0 % 33.2  33.0  32.2   Platelets 150 - 400 K/uL 113  109  109         Latest Ref Rng & Units 03/13/2023    9:15 AM 02/27/2023    8:53 AM 02/12/2023    9:56 AM  CMP  Glucose 70 - 99 mg/dL 98  89  92   BUN 8 - 23 mg/dL 9  14  14    Creatinine 0.44 - 1.00 mg/dL 0.86  5.78  4.69   Sodium 135 - 145 mmol/L 136  137  138   Potassium 3.5 - 5.1 mmol/L 4.0  4.0  4.2   Chloride 98 - 111 mmol/L 103  104  105   CO2 22 - 32 mmol/L 29  27  30    Calcium 8.9 - 10.3 mg/dL 8.7  8.7  8.5   Total Protein 6.5 - 8.1 g/dL 6.8  7.3  7.0   Total Bilirubin 0.3 - 1.2 mg/dL 0.4  0.3  0.4   Alkaline Phos 38 - 126 U/L 95  91  87   AST 15 - 41 U/L 17  19  21    ALT 0 - 44 U/L 7  9  10        RADIOGRAPHIC STUDIES: I have personally reviewed the radiological images as listed and agreed with the findings in the report. No results found.    Orders Placed This Encounter  Procedures   CBC with Differential (Cancer Center Only)    Standing Status:   Future    Standing Expiration Date:   05/14/2024   CMP (Cancer Center only)    Standing Status:   Future    Standing Expiration Date:   05/14/2024   Total Protein, Urine dipstick    Standing Status:   Future    Standing Expiration Date:   05/14/2024   CEA (IN HOUSE-[facility])    Standing Status:   Standing    Number of  Occurrences:   20    Standing Expiration Date:   03/12/2024   All questions were answered. The patient knows to call the clinic with any problems, questions or concerns. No barriers to learning was detected. The total time spent in the appointment was 25 minutes.     [name], MD 03/13/2023   Carolin Coy, CMA, am acting as scribe for [name], MD.   I have reviewed the above documentation for accuracy and completeness, and I agree with the above.

## 2023-03-13 NOTE — Assessment & Plan Note (Signed)
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  -She began first-line chemo with dose-reduced FOLFIRI and Beva in 2/22.  -we changed to FOLFOX with continued Beva on 03/28/22. She tolerated well overall with some fatigue and cold sensitivity. -I personally reviewed her restaging CT from 10/07/2022 which showed overall SD in liver and rectum, stable and indermininate lung nodules, no other new lesions  -She is tolerating chemotherapy overall well, will continue. -I personally reviewed her restaging CT scan from October 07, 2022, which showed overall stable disease.  She has multiple small lung nodules, indeterminate, including one 7 mm nodule, will continue monitoring. 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 -Plan to repeat a restaging CT scan in Oct

## 2023-03-13 NOTE — Assessment & Plan Note (Signed)
diagnosed in 12/2015. S/p right hemicolectomy with right salpingo oophorectomy and adjuvant ChemoRT. 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 

## 2023-03-13 NOTE — Patient Instructions (Addendum)
[facility]  Discharge Instructions: Thank you for choosing [facility] to provide your oncology and hematology care.   If you have a lab appointment with the Cancer Center, please go directly to the Cancer Center and check in at the registration area.   Wear comfortable clothing and clothing appropriate for easy access to any Portacath or PICC line.   We strive to give you quality time with your provider. You may need to reschedule your appointment if you arrive late (15 or more minutes).  Arriving late affects you and other patients whose appointments are after yours.  Also, if you miss three or more appointments without notifying the office, you may be dismissed from the clinic at the provider's discretion.      For prescription refill requests, have your pharmacy contact our office and allow 72 hours for refills to be completed.    Today you received the following chemotherapy and/or immunotherapy agents: Bevacizumab (Avastin), Oxaliplatin, Leucovorin, and Fluorouracil.   To help prevent nausea and vomiting after your treatment, we encourage you to take your nausea medication as directed.  BELOW ARE SYMPTOMS THAT SHOULD BE REPORTED IMMEDIATELY: *FEVER GREATER THAN 100.4 F (38 C) OR HIGHER *CHILLS OR SWEATING *NAUSEA AND VOMITING THAT IS NOT CONTROLLED WITH YOUR NAUSEA MEDICATION *UNUSUAL SHORTNESS OF BREATH *UNUSUAL BRUISING OR BLEEDING *URINARY PROBLEMS (pain or burning when urinating, or frequent urination) *BOWEL PROBLEMS (unusual diarrhea, constipation, pain near the anus) TENDERNESS IN MOUTH AND THROAT WITH OR WITHOUT PRESENCE OF ULCERS (sore throat, sores in mouth, or a toothache) UNUSUAL RASH, SWELLING OR PAIN  UNUSUAL VAGINAL DISCHARGE OR ITCHING   Items with * indicate a potential emergency and should be followed up as soon as possible or go to the Emergency Department if any problems should occur.  Please show the  CHEMOTHERAPY ALERT CARD or IMMUNOTHERAPY ALERT CARD at check-in to the Emergency Department and triage nurse.  Should you have questions after your visit or need to cancel or reschedule your appointment, please contact [facility]  Dept: [phone]  and follow the prompts.  Office hours are 8:00 a.m. to 4:30 p.m. Monday - Friday. Please note that voicemails left after 4:00 p.m. may not be returned until the following business day.  We are closed weekends and major holidays. You have access to a nurse at all times for urgent questions. Please call the main number to the clinic Dept: [phone] and follow the prompts.   For any non-urgent questions, you may also contact your provider using MyChart. We now offer e-Visits for anyone 60 and older to request care online for non-urgent symptoms. For details visit [email or web address].   Also download the MyChart app! Go to the app store, search "MyChart", open the app, select , and log in with your MyChart username and password.  The chemotherapy medication bag should finish at 46 hours, 96 hours, or 7 days. For example, if your pump is scheduled for 46 hours and it was put on at 4:00 p.m., it should finish at 2:00 p.m. the day it is scheduled to come off regardless of your appointment time.     Estimated time to finish at:   If the display on your pump reads "Low Volume" and it is beeping, take the batteries out of the pump and come to the cancer center for it to be taken off.   If the pump alarms go off prior to  the pump reading "Low Volume" then call [phone] and someone can assist you.  If the plunger comes out and the chemotherapy medication is leaking out, please use your home chemo spill kit to clean up the spill. Do NOT use paper towels or other household products.  If you have problems or questions regarding your pump, please call either [phone] (24 hours a day) or the cancer  center Monday-Friday 8:00 a.m.- 4:30 p.m. at the clinic number and we will assist you. If you are unable to get assistance, then go to the nearest Emergency Department and ask the staff to contact the IV team for assistance.     Hypocalcemia, Adult Hypocalcemia is when the level of calcium in a person's blood is below normal. Calcium is a mineral that is used by the body in many ways. Not having enough blood calcium can affect the nervous system. This can lead to problems with the muscles, the heart, and the brain. What are the causes? This condition may be caused by: A deficiency of vitamin D or magnesium or both. Decreased levels of parathyroid hormone (hypoparathyroidism). Kidney function problems. Low levels of a body protein called albumin. Inflammation of the pancreas (pancreatitis). Not taking in enough vitamins and minerals in the diet or having intestinal problems that interfere with absorption of nutrients. Certain medicines. What are the signs or symptoms? Symptoms of this condition may include: Numbness and tingling in the fingers, toes, or around the mouth. Muscle twitching, aches, or cramps, especially in the legs, feet, and back. Spasm of the voice box. This may make it difficult to breathe or speak. Fast heartbeats (palpitations) and abnormal heart rhythms (dysrhythmias). Shaking uncontrollably (seizures). Memory problems, confusion, or difficulty thinking. Depression, anxiety, irritability, or changes in personality. Long-term (chronic) symptoms of this condition may include: Coarse, brittle hair and nails. Dry skin or lasting skin diseases. These include psoriasis, eczema, or dermatitis. Dental cavities. Clouding of the eye lens (cataracts). Some people may not have any symptoms, especially if they have chronic hypocalcemia. How is this diagnosed?  This condition is usually diagnosed with a blood test. You may also have other tests to help determine the underlying  cause of the condition. This may include more blood tests and imaging tests. How is this treated? This condition may be treated with: Calcium given by mouth or through an IV. The method used for giving calcium will depend on the severity of the condition. If your condition is severe, you may need to be closely monitored in the hospital. Giving other minerals (electrolytes), such as magnesium. Other treatment will depend on the cause of the condition. Follow these instructions at home: Follow diet instructions from your health care provider or dietitian. Take supplements only as told by your health care provider. Do not take an iron supplement within 2 hours of taking a calcium supplement. Keep all follow-up visits. This is important. Contact a health care provider if: You have increased muscle twitching or cramps. You have new swelling in the feet, ankles, or legs. You develop changes in mood, memory, or personality. Get help right away if: You have chest pain. You have persistent rapid or irregular heartbeats. You have trouble breathing. You faint. You start to have seizures. You have confusion. These symptoms may represent a serious problem that is an emergency. Do not wait to see if the symptoms will go away. Get medical help right away. Call your local emergency services (911 in the [place] Do not drive yourself to the hospital.  Summary Hypocalcemia is when the level of calcium in a person's blood is below normal. Not having enough blood calcium can affect the nervous system. This condition may be treated with calcium given by mouth or through an IV, or by receiving other minerals. Other treatment depends on the cause of the condition. Take supplements only as told by your health care provider. Contact a health care provider if you have new or worsening symptoms. Keep all follow-up visits. This is important. This information is not intended to replace advice given to you by your health  care provider. Make sure you discuss any questions you have with your health care provider. Document Revised: 11/22/2020 Document Reviewed: 11/22/2020 Elsevier Patient Education  2024 [employer].

## 2023-03-14 ENCOUNTER — Other Ambulatory Visit: Payer: Self-pay

## 2023-03-15 ENCOUNTER — Inpatient Hospital Stay: Payer: Federal, State, Local not specified - PPO

## 2023-03-15 ENCOUNTER — Other Ambulatory Visit: Payer: Self-pay

## 2023-03-15 VITALS — BP 127/85 | HR 80 | Temp 98.7°F

## 2023-03-15 DIAGNOSIS — C20 Malignant neoplasm of rectum: Secondary | ICD-10-CM

## 2023-03-15 DIAGNOSIS — Z5112 Encounter for antineoplastic immunotherapy: Secondary | ICD-10-CM | POA: Diagnosis not present

## 2023-03-15 MED ORDER — HEPARIN SOD (PORK) LOCK FLUSH 100 UNIT/ML IV SOLN
500.0000 [IU] | Freq: Once | INTRAVENOUS | Status: AC | PRN
  Administered 2023-03-15: 500 [IU]

## 2023-03-15 MED ORDER — SODIUM CHLORIDE 0.9% FLUSH
10.0000 mL | INTRAVENOUS | Status: DC | PRN
  Administered 2023-03-15: 10 mL

## 2023-03-15 MED ORDER — PEGFILGRASTIM-JMDB 6 MG/0.6ML ~~LOC~~ SOSY
6.0000 mg | PREFILLED_SYRINGE | Freq: Once | SUBCUTANEOUS | Status: AC
  Administered 2023-03-15: 6 mg via SUBCUTANEOUS

## 2023-03-27 ENCOUNTER — Ambulatory Visit: Payer: Federal, State, Local not specified - PPO

## 2023-03-27 ENCOUNTER — Other Ambulatory Visit: Payer: Federal, State, Local not specified - PPO

## 2023-03-27 ENCOUNTER — Ambulatory Visit: Payer: Federal, State, Local not specified - PPO | Admitting: Hematology

## 2023-04-01 ENCOUNTER — Encounter: Payer: Self-pay | Admitting: Hematology

## 2023-04-01 NOTE — Progress Notes (Signed)
Zirabev vials unavailable.  Vegzelma 100mg  vials unavailable. Zirabev changed to Mvasi. Patient is no auth required.  Brittney Spencer, [place], BCPS, BCOP 04/01/2023 3:00 PM

## 2023-04-01 NOTE — Progress Notes (Unsigned)
Patient Care Team: Patient, No Pcp Per as PCP - General (General Practice) [name], NP as Nurse Practitioner (Oncology) [name], MD as Consulting Physician (Oncology) [name], RN (Inactive) as Oncology Nurse Navigator   CHIEF COMPLAINT: Follow up rectal and colon cancer   Oncology History Overview Note  Cancer Staging Cancer of ascending colon [facility]) Staging form: Colon and Rectum, AJCC 7th Edition - Clinical stage from 02/07/2016: Stage IIIC (T4b, N1b, M0) - Signed by [name], MD on 03/04/2016 Laterality: Right Residual tumor (R): R2 - Macroscopic    Cancer of ascending colon (HCC)  10/25/2015 Imaging   CT ABD/PELVIS:  Inflammatory changes inferior to the cecal tip appear improved, there is still irregular soft tissue thickening of the cecal tip, and there are adjacent prominent lymph nodes in the ileocolonic mesentery, measuring 13 mm on image 49 and 8 mm on image 52. In addition, there is a 2.5 x 1.8 cm nodule on image 46 which has central low density. Therefore, these findings are moderately suspicious for an underlying cecal malignancy with perforation.    01/19/2016 Procedure   COLONOSCOPY per Dr. [name]: Fungating, ulcerated mass almost obstructing mid ascending colon   01/19/2016 Initial Biopsy   Diagnosis Surgical [P], cecal mass - INVASIVE ADENOCARCINOMA WITH ULCERATION. - SEE COMMENT.   02/05/2016 Tumor Marker   Patient's tumor was tested for the following markers: CEA Results of the tumor marker test revealed 5.7.   02/07/2016 Initial Diagnosis   Cancer of ascending colon (HCC)   02/07/2016 Definitive Surgery   Laparoscopic assisted right hemicolectomy and right salpingo oopherectomy--[name]   02/07/2016 Pathologic Stage   p T4 N1b   2/43 nodes +   02/07/2016 Pathology Results   MMR normal; G2 adenocarcinoma;proximal & distal margins negative; soft tissue mass on pelvic sidewall + for adenocarcinoma with positive margin MSI Stable    03/08/2016 Imaging   CT chest negative for metastasis.    03/19/2016 - 04/25/2016 Radiation Therapy   Adjuvant irradiation, 50 gray in 28 fractions   03/19/2016 - 04/22/2016 Chemotherapy   Xeloda 1500 mg twice daily, started on 03/19/2016, dose reduced to 1000 mg twice daily from week 3 due to neutropenia, and patient stopped 3 days before last dose radiation due to difficulty swallowing the pill    05/20/2016 -  Adjuvant Chemotherapy   Patient declined adjuvant chemotherapy   09/16/2016 Imaging   CT CAP w Contrast 1. No evidence of local tumor recurrence at the ileocolic anastomosis. 2. No findings suspicious for metastatic disease in the chest, abdomen or pelvis. 3. Nonspecific trace free fluid in the pelvic cul-de-sac. 4. Stable solitary 3 mm right upper lobe pulmonary nodule, for which 6 month stability has been demonstrated, probably benign. 5. Additional findings include stable right posterior pericardial cyst and small calcified uterine fibroids.   05/13/2017 Imaging   CT CAP W Contrast 05/13/17 IMPRESSION: 1. No current findings of residual or recurrent malignancy. 2. Mild prominence of stool throughout the colon. Nondistended portions of the rectum. 3. Several tiny pulmonary nodules are stable from the earliest available comparison of 03/08/2016 and probably benign, but may merit surveillance. 4. Other imaging findings of potential clinical significance: Old granulomatous disease. Aortoiliac atherosclerotic vascular disease. Lumbar spondylosis and degenerative disc disease. Stable amount of trace free pelvic fluid.   04/27/2018 Imaging   04/27/2018 CT CAP IMPRESSION: Stable exam. No evidence of recurrent or metastatic carcinoma within the chest, abdomen, or pelvis   04/19/2019 Imaging  CT CAP W Contrast  IMPRESSION: Chest Impression:   1. No evidence of thoracic metastasis. 2. Stable small bilateral pulmonary nodules.   Abdomen / Pelvis Impression:   1.  No evidence local colorectal carcinoma recurrence or metastasis in the abdomen pelvis. 2. Post RIGHT hemicolectomy.   01/22/2021 Imaging   CT CAP  IMPRESSION: CT CHEST IMPRESSION   1. Similar nonspecific pulmonary nodules. 2. New posterior left upper lobe reticulonodular opacity, suspicious for interval mild infection or inflammation. 3. No thoracic adenopathy.   CT ABDOMEN AND PELVIS IMPRESSION   1. Further decrease in size of high left hepatic lobe 3 mm low-density lesion. No new or progressive metastatic disease within the abdomen or pelvis. 2. Similar trace free pelvic fluid. 3. Similar nonspecific mid rectal wall thickening. 4.  Aortic Atherosclerosis (ICD10-I70.0).   04/23/2021 Imaging   CT CAP  IMPRESSION: 1. Treated metastatic lesion between segments 2 and 3 of the liver, slightly smaller and less distinct than prior examination. No other signs of definite metastatic disease elsewhere in the abdomen or pelvis. 2. Multiple small pulmonary nodules, stable compared to the prior examination, favored to be benign. No definitive findings to suggest metastatic disease to the thorax. 3. Aortic atherosclerosis. 4. Additional incidental findings, as above.   08/13/2021 Imaging   EXAM: CT CHEST, ABDOMEN, AND PELVIS WITH CONTRAST  IMPRESSION: 1. A previously seen PET avid lesion of the anterior left lobe of the liver, hepatic segment II, is no longer discretely appreciable consistent with treatment response of a hepatic metastasis. 2. No evidence of new metastatic disease in the chest, abdomen, or pelvis. 3. Interval increase in a small focus of consolidation and nodularity of the medial left upper lobe, consistent with minimal, ongoing atypical infection. Additional tiny bilateral pulmonary nodules are stable and almost certainly incidental benign. Attention on follow-up. 4. Status post right hemicolectomy and ileocolic anastomosis.   07/02/2022 Imaging     IMPRESSION: CHEST IMPRESSION:   1. No evidence of thoracic metastasis. 2. Stable small pulmonary nodules.   PELVIS IMPRESSION:   1. Stable to slight decrease in size of subcapsular lesion in the RIGHT hepatic lobe. 2. No evidence of new or progressive disease in the abdomen pelvis. 3.  Aortic Atherosclerosis (ICD10-I70.0).     12/31/2022 Imaging    IMPRESSION: 1. Right hepatic lobe metastasis has undergone mild-to-moderate enlargement since 10/07/2022. A left hepatic lobe 5 mm lesion is not readily apparent on the prior, suspicious for a new metastasis. 2. Similar nonspecific tiny pulmonary nodules. 3. Similar amorphous soft tissue thickening within the posterior superior right hemipelvis, hypermetabolic on prior PET and indeterminate for residual disease versus scarring. 4. Similar equivocal soft tissue fullness within the rectum. 5. Increased size of an abdominopelvic ventral wall nodule for which subcutaneous metastasis are a concern. 6. New trace right pelvic fluid. 7. Incidental findings, including: Aortic Atherosclerosis (ICD10-I70.0). 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 - Patent side-to-side ileo-colonic anastomosis, characterized by healthy appearing mucosa. - The examined portion of the ileum was normal. - One diminutive polyp in the proximal transverse colon, removed with a cold biopsy forceps. Resected and retrieved. - Likely malignant partially obstructing tumor in the mid rectum. Biopsied. Tattooed. - The examination was otherwise normal on direct and retroflexion views.  06/15/2020 Initial Biopsy   Diagnosis 1. Transverse Colon Polyp - HYPERPLASTIC POLYP 2. Rectum, biopsy -  ADENOCARCINOMA ARISING IN A TUBULAR ADENOMA WITH HIGH-GRADE DYSPLASIA. SEE NOTE Diagnosis Note 2. Dr. Luisa Hart reviewed the case and concurs with the diagnosis. Dr. [name] was notified on 06/16/2020.   06/28/2020 Imaging   CT CAP  IMPRESSION: 1. New low-density focus in the anterior aspect of the lateral segment LEFT hepatic lobe measuring 1.2 x 1.0 cm, compatible with small metastatic lesion in the LEFT hepatic lobe. 2. Soft tissue in the RIGHT iliac fossa following RIGHT hemicolectomy invades the psoas musculature and is slowly enlarging over time, more linear on the prior study now highly concerning for recurrence/metastasis to this location. 3. Signs of enteritis, potentially post radiation changes of the small bowel. Tethered small bowel in the RIGHT lower quadrant shows focal thickening and narrowing suspicious for small bowel involvement and developing partial obstruction though currently contrast passes beyond this point into the colon. 4. Rectal thickening in this patient with known rectal mass as described. 5. No evidence of metastatic disease in the chest. 6. Stable small pulmonary nodules. 7.  and aortic atherosclerosis.   Aortic Atherosclerosis (ICD10-I70.0) and Emphysema (ICD10-J43.9).   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 Exam positive for FDG avid rectal tumor which corresponds to the recent colonoscopy findings. 2. FDG avid soft tissue mass within the right iliac fossa is noted and consistent with local tumor recurrence from previous ascending colon tumor. 3. Lateral segment left lobe of liver lesion is FDG avid concerning for liver metastasis. 4. No specific findings identified to suggest metastatic disease to the chest.   08/10/2020 -  Chemotherapy   First-line FOLFIRI q2weeks starting 08/10/20. dose reduced with cycle 1. Irinotecan/5FU increased and Bevacizumab added with cycle 2 on  08/23/2020    08/17/2020 - 03/09/2022 Chemotherapy   Patient is on Treatment Plan : COLORECTAL FOLFIRI + Bevacizumab q14d     10/27/2020 Imaging   CT CAP  IMPRESSION: 1. Interval decrease in size of the hypermetabolic left hepatic lesion, consistent with metastatic disease. No new liver lesion evident. 2. Interval resolution of the hypermetabolic soft tissue lesion along the right iliac fossa with no measurable soft tissue lesion remaining at this location today. 3. Similar appearance of soft tissue fullness in the rectum at the site of the hypermetabolic lesion seen previously. 4. Stable tiny bilateral pulmonary nodules. Continued attention on follow-up recommended. 5. Small volume free fluid in the pelvis. 6. Aortic Atherosclerosis (ICD10-I70.0).   01/22/2021 Imaging   CT CAP  IMPRESSION: CT CHEST IMPRESSION   1. Similar nonspecific pulmonary nodules. 2. New posterior left upper lobe reticulonodular opacity, suspicious for interval mild infection or inflammation. 3. No thoracic adenopathy.   CT ABDOMEN AND PELVIS IMPRESSION   1. Further decrease in size of high left hepatic lobe 3 mm low-density lesion. No new or progressive metastatic disease within the abdomen or pelvis. 2. Similar trace free pelvic fluid. 3. Similar nonspecific mid rectal wall thickening. 4.  Aortic Atherosclerosis (ICD10-I70.0).   04/23/2021 Imaging   CT CAP  IMPRESSION: 1. Treated metastatic lesion between segments 2 and 3 of the liver, slightly smaller and less distinct than prior examination. No other signs of definite metastatic disease elsewhere in the abdomen or pelvis. 2. Multiple small pulmonary nodules, stable compared to the prior examination, favored to be benign. No definitive findings to suggest metastatic disease to the thorax.  3. Aortic atherosclerosis. 4. Additional incidental findings, as above.   08/13/2021 Imaging   EXAM: CT CHEST, ABDOMEN, AND PELVIS WITH  CONTRAST  IMPRESSION: 1. A previously seen PET avid lesion of the anterior left lobe of the liver, hepatic segment II, is no longer discretely appreciable consistent with treatment response of a hepatic metastasis. 2. No evidence of new metastatic disease in the chest, abdomen, or pelvis. 3. Interval increase in a small focus of consolidation and nodularity of the medial left upper lobe, consistent with minimal, ongoing atypical infection. Additional tiny bilateral pulmonary nodules are stable and almost certainly incidental benign. Attention on follow-up. 4. Status post right hemicolectomy and ileocolic anastomosis.   03/28/2022 -  Chemotherapy   Patient is on Treatment Plan : COLORECTAL FOLFOX + Bevacizumab q14d     07/02/2022 Imaging    IMPRESSION: CHEST IMPRESSION:   1. No evidence of thoracic metastasis. 2. Stable small pulmonary nodules.   PELVIS IMPRESSION:   1. Stable to slight decrease in size of subcapsular lesion in the RIGHT hepatic lobe. 2. No evidence of new or progressive disease in the abdomen pelvis.   10/07/2022 Imaging    IMPRESSION: 1. Stable hypovascular mass in the periphery of the right lobe of the liver, which likely represents a treated metastatic lesion. Stable subcentimeter lesion just medial to this is also similar to the recent prior examination. No new hepatic lesions are otherwise noted. 2. Persistent but stable poorly defined soft tissue thickening in the right lower quadrant associated with multiple small bowel loops and the overlying iliopsoas musculature, which corresponds to focal hypermetabolism on remote prior PET-CT. Given the stability, this likely represents a treated metastatic lesion. Continued attention on follow-up studies is recommended. 3. Persistent mass-like thickening in the proximal rectum corresponding to previously noted hypermetabolic rectal neoplasm on prior PET-CT. This currently measures approximately 2.8 x 2.3 cm  and is slightly more apparent than the most recent prior study. 4. Multiple small pulmonary nodules generally stable compared to the prior study, with exception of a new branching nodule in the anterior aspect of the left upper lobe measuring 7 x 3 mm (mean diameter 5 mm). This is nonspecific. Close attention on follow-up studies is recommended to ensure stability. 5. Aortic atherosclerosis. 6. Additional incidental findings, as above.     12/31/2022 Imaging    IMPRESSION: 1. Right hepatic lobe metastasis has undergone mild-to-moderate enlargement since 10/07/2022. A left hepatic lobe 5 mm lesion is not readily apparent on the prior, suspicious for a new metastasis. 2. Similar nonspecific tiny pulmonary nodules. 3. Similar amorphous soft tissue thickening within the posterior superior right hemipelvis, hypermetabolic on prior PET and indeterminate for residual disease versus scarring. 4. Similar equivocal soft tissue fullness within the rectum. 5. Increased size of an abdominopelvic ventral wall nodule for which subcutaneous metastasis are a concern. 6. New trace right pelvic fluid. 7. Incidental findings, including: Aortic Atherosclerosis (ICD10-I70.0). Possible constipation      CURRENT THERAPY:  FOLFOX with bevacizumab, q. 14 days starting 03/28/2022; Oxali dose reduced and eventually held from C15 (10/31/22), restarted 01/01/2023 due to disease progression    INTERVAL HISTORY Brittney Spencer returns for follow up as scheduled. She continues FOLFOX/Beva.   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 Some residual intraretinal fluid remains, very small localized subfoveal of fluid remains although this continues to slowly resorb. We'll continue to observe.   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   Resolved March 2021 post vitrectomy     Past Surgical History:  Procedure Laterality Date   COLONOSCOPY  2018   [facility]   COLONSCOPY  12/2015   IR IMAGING GUIDED PORT INSERTION  08/04/2020   LAPAROSCOPIC RIGHT HEMI COLECTOMY Right 02/07/2016   Procedure: LAPAROSCOPIC ASSISTED RIGHT HEMI COLECTOMY AND RIGHT SALPINGO OOPHERECTOMY;  Surgeon: Glenna Fellows, MD;  Location: [facility];  Service: General;  Laterality: Right;   RETINAL DETACHMENT SURGERY  09/2019     Outpatient Encounter Medications as of 04/03/2023  Medication Sig   ALPRAZolam (XANAX) 0.25 MG tablet Take 1 tablet (0.25 mg total) by mouth daily as needed for anxiety.   Cholecalciferol (VITAMIN D3 PO) Take by mouth daily.   docusate sodium (COLACE) 100 MG capsule 1 capsule as needed   gabapentin (NEURONTIN) 100 MG capsule Take 1 capsule (100 mg total) by mouth at bedtime.   lidocaine-prilocaine (EMLA) cream Apply 1 Application topically as needed.   NORVASC 2.5 MG tablet TAKE 1 TABLET BY MOUTH EVERY DAY   ondansetron (ZOFRAN) 8 MG tablet Take 1 tablet (8 mg total) by mouth every 8 (eight) hours as needed for nausea or vomiting.   prochlorperazine (COMPAZINE) 10 MG tablet Take 1 tablet (10 mg total) by mouth every 6 (six) hours as needed for nausea or vomiting.   Facility-Administered Encounter Medications as of 04/03/2023  Medication   0.9 %  sodium chloride infusion     There were no vitals filed for this visit. There is no height or weight on file to calculate BMI.   PHYSICAL EXAM GENERAL:alert, no distress and comfortable SKIN: no rash   EYES: sclera clear NECK: without mass LYMPH:  no palpable cervical or supraclavicular lymphadenopathy  LUNGS: clear with normal breathing effort HEART: regular rate & rhythm, no lower extremity edema ABDOMEN: abdomen soft, non-tender and normal bowel sounds NEURO: alert & oriented x 3 with fluent speech, no focal motor/sensory deficits Breast exam:  PAC without erythema    CBC    Component Value Date/Time   WBC 4.9 03/13/2023 0915   WBC 4.9 07/02/2022 0827   RBC 3.62 (L) 03/13/2023 0915   HGB 10.3 (L) 03/13/2023 0915   HGB 11.3 (L) 05/13/2017 1252   HCT 33.2 (L) 03/13/2023 0915   HCT 36.2 05/13/2017 1252   PLT 113 (L) 03/13/2023 0915   PLT 189 05/13/2017 1252   MCV 91.7 03/13/2023 0915   MCV 87.4 05/13/2017 1252   MCH 28.5 03/13/2023 0915   MCHC 31.0 03/13/2023 0915   RDW 13.4 03/13/2023 0915   RDW 13.9 05/13/2017 1252   LYMPHSABS 0.9 03/13/2023 0915   LYMPHSABS 1.0 05/13/2017 1252   MONOABS 0.6 03/13/2023 0915   MONOABS 0.3 05/13/2017 1252   EOSABS 0.1 03/13/2023 0915   EOSABS 0.1 05/13/2017 1252   BASOSABS 0.0 03/13/2023 0915   BASOSABS 0.0 05/13/2017 1252  CMP     Component Value Date/Time   NA 136 03/13/2023 0915   NA 140 05/13/2017 1252   K 4.0 03/13/2023 0915   K 3.7 05/13/2017 1252   CL 103 03/13/2023 0915   CO2 29 03/13/2023 0915   CO2 28 05/13/2017 1252   GLUCOSE 98 03/13/2023 0915   GLUCOSE 74 05/13/2017 1252   BUN 9 03/13/2023 0915   BUN 12.5 05/13/2017 1252   CREATININE 0.74 03/13/2023 0915   CREATININE 0.9 05/13/2017 1252   CALCIUM 8.7 (L) 03/13/2023 0915   CALCIUM 9.4 05/13/2017 1252   PROT 6.8 03/13/2023 0915   PROT 7.6 05/13/2017 1252   ALBUMIN 3.4 (L) 03/13/2023 0915   ALBUMIN 4.1 05/13/2017 1252   AST 17 03/13/2023 0915   AST 19 05/13/2017 1252   ALT 7 03/13/2023 0915   ALT 10 05/13/2017 1252   ALKPHOS 95 03/13/2023 0915   ALKPHOS 63 05/13/2017 1252   BILITOT 0.4 03/13/2023 0915   BILITOT 0.47 05/13/2017 1252   GFRNONAA >60  03/13/2023 0915   GFRAA >60 08/18/2019 0905     ASSESSMENT & PLAN:Brittney Spencer is a 72 y.o. female with   1. Rectal adenocarcinoma, with liver and right pelvic metastasis, recurrence from previous colon cancer vs new primary   -Diagnosed on routine screening colonoscopy 05/2020, rectal mass biopsy showed invasive adenocarcinoma, arising from a tubular adenoma -Her 07/12/20 PET showed positive uptake of known rectal tumor and soft tissue mass within the right iliac fossa indicating recurrent prior colon cancer. There is also left lobe of liver lesion is FDG avid concerning for liver metastasis. pt declined liver biopsy -whether this is metastatic rectal or recurrent/metastatic colon cancer, this is likely not curable, but still treatable. She previously declined referral for HIPEC -She progressed on first-line FOLFIRI/Beva -She switched to FOLFOX and continued Beva starting 03/28/2022.  Due to stable disease and neuropathy, oxaliplatin was stopped 09/2022 -Restaging CT 12/30/2022 showed cancer progression in the liver, and rising CEA consistent with disease progression -She resumed dose-reduced FOLFOX/Beva 01/09/2023 with G-CSF   2. Cancer of ascending colon, [id number], stage IIIC, MSI-stable, (+) surgical margins at pelvic wall     -She was diagnosed in 12/2015. She is s/p right hemicolectomy with right salpingo oophorectomy and  adjuvant ChemoRT. -she declined adjuvant chemo due to the concern of side effects and impact on her quality of life. -Her 04/2019 CT scan was NED  -With rectal cancer diagnosis, her PET from 07/12/20 indicates soft tissue mass within the right iliac fossa is noted and consistent with local tumor recurrence from previous ascending colon tumor.  Subsequent imaging showed this resolved after beginning treatment -She declined option of liver biopsy.     3. HTN, Anxiety -She'll follow-up with her primary care physician and continue medication -She uses half tab Xanax as  needed, mostly only on treatment days. refilled -stable, refilled      PLAN:  No orders of the defined types were placed in this encounter.     All questions were answered. The patient knows to call the clinic with any problems, questions or concerns. No barriers to learning were detected. I spent *** counseling the patient face to face. The total time spent in the appointment was *** and more than 50% was on counseling, review of test results, and coordination of care.   Brittney Glad, NP-C @DATE @

## 2023-04-02 MED FILL — Dexamethasone Sodium Phosphate Inj 100 MG/10ML: INTRAMUSCULAR | Qty: 1 | Status: AC

## 2023-04-03 ENCOUNTER — Encounter: Payer: Self-pay | Admitting: Nurse Practitioner

## 2023-04-03 ENCOUNTER — Inpatient Hospital Stay: Payer: Federal, State, Local not specified - PPO

## 2023-04-03 ENCOUNTER — Inpatient Hospital Stay: Payer: Federal, State, Local not specified - PPO | Admitting: Nurse Practitioner

## 2023-04-03 ENCOUNTER — Inpatient Hospital Stay: Payer: Federal, State, Local not specified - PPO | Attending: Nurse Practitioner

## 2023-04-03 DIAGNOSIS — K5900 Constipation, unspecified: Secondary | ICD-10-CM | POA: Insufficient documentation

## 2023-04-03 DIAGNOSIS — Z5111 Encounter for antineoplastic chemotherapy: Secondary | ICD-10-CM | POA: Insufficient documentation

## 2023-04-03 DIAGNOSIS — C787 Secondary malignant neoplasm of liver and intrahepatic bile duct: Secondary | ICD-10-CM | POA: Diagnosis not present

## 2023-04-03 DIAGNOSIS — C182 Malignant neoplasm of ascending colon: Secondary | ICD-10-CM

## 2023-04-03 DIAGNOSIS — F419 Anxiety disorder, unspecified: Secondary | ICD-10-CM | POA: Insufficient documentation

## 2023-04-03 DIAGNOSIS — Z452 Encounter for adjustment and management of vascular access device: Secondary | ICD-10-CM | POA: Insufficient documentation

## 2023-04-03 DIAGNOSIS — I10 Essential (primary) hypertension: Secondary | ICD-10-CM | POA: Insufficient documentation

## 2023-04-03 DIAGNOSIS — Z9049 Acquired absence of other specified parts of digestive tract: Secondary | ICD-10-CM | POA: Insufficient documentation

## 2023-04-03 DIAGNOSIS — Z95828 Presence of other vascular implants and grafts: Secondary | ICD-10-CM

## 2023-04-03 DIAGNOSIS — Z5189 Encounter for other specified aftercare: Secondary | ICD-10-CM | POA: Diagnosis not present

## 2023-04-03 DIAGNOSIS — C20 Malignant neoplasm of rectum: Secondary | ICD-10-CM

## 2023-04-03 DIAGNOSIS — Z1509 Genetic susceptibility to other malignant neoplasm: Secondary | ICD-10-CM | POA: Insufficient documentation

## 2023-04-03 DIAGNOSIS — R911 Solitary pulmonary nodule: Secondary | ICD-10-CM | POA: Diagnosis not present

## 2023-04-03 DIAGNOSIS — G620 Drug-induced polyneuropathy: Secondary | ICD-10-CM | POA: Insufficient documentation

## 2023-04-03 DIAGNOSIS — Z79899 Other long term (current) drug therapy: Secondary | ICD-10-CM | POA: Diagnosis not present

## 2023-04-03 DIAGNOSIS — Z923 Personal history of irradiation: Secondary | ICD-10-CM | POA: Diagnosis not present

## 2023-04-03 DIAGNOSIS — T451X5D Adverse effect of antineoplastic and immunosuppressive drugs, subsequent encounter: Secondary | ICD-10-CM | POA: Diagnosis not present

## 2023-04-03 DIAGNOSIS — Z5112 Encounter for antineoplastic immunotherapy: Secondary | ICD-10-CM | POA: Diagnosis present

## 2023-04-03 LAB — CMP (CANCER CENTER ONLY)
ALT: 10 U/L (ref 0–44)
AST: 23 U/L (ref 15–41)
Albumin: 3.5 g/dL (ref 3.5–5.0)
Alkaline Phosphatase: 102 U/L (ref 38–126)
Anion gap: 5 (ref 5–15)
BUN: 12 mg/dL (ref 8–23)
CO2: 27 mmol/L (ref 22–32)
Calcium: 8.9 mg/dL (ref 8.9–10.3)
Chloride: 105 mmol/L (ref 98–111)
Creatinine: 0.76 mg/dL (ref 0.44–1.00)
GFR, Estimated: >60 mL/min (ref >60)
Glucose, Bld: 87 mg/dL (ref 70–99)
Potassium: 4.1 mmol/L (ref 3.5–5.1)
Sodium: 137 mmol/L (ref 135–145)
Total Bilirubin: 0.3 mg/dL (ref 0.3–1.2)
Total Protein: 7.2 g/dL (ref 6.5–8.1)

## 2023-04-03 LAB — CBC WITH DIFFERENTIAL (CANCER CENTER ONLY)
Abs Immature Granulocytes: 0.01 10*3/uL (ref 0.00–0.07)
Basophils Absolute: 0 10*3/uL (ref 0.0–0.1)
Basophils Relative: 1 %
Eosinophils Absolute: 0.1 10*3/uL (ref 0.0–0.5)
Eosinophils Relative: 2 %
HCT: 32.5 % — ABNORMAL LOW (ref 36.0–46.0)
Hemoglobin: 10.2 g/dL — ABNORMAL LOW (ref 12.0–15.0)
Immature Granulocytes: 0 %
Lymphocytes Relative: 29 %
Lymphs Abs: 1 10*3/uL (ref 0.7–4.0)
MCH: 28.6 pg (ref 26.0–34.0)
MCHC: 31.4 g/dL (ref 30.0–36.0)
MCV: 91 fL (ref 80.0–100.0)
Monocytes Absolute: 0.4 10*3/uL (ref 0.1–1.0)
Monocytes Relative: 12 %
Neutro Abs: 1.9 10*3/uL (ref 1.7–7.7)
Neutrophils Relative %: 56 %
Platelet Count: 145 10*3/uL — ABNORMAL LOW (ref 150–400)
RBC: 3.57 MIL/uL — ABNORMAL LOW (ref 3.87–5.11)
RDW: 14.3 % (ref 11.5–15.5)
WBC Count: 3.4 10*3/uL — ABNORMAL LOW (ref 4.0–10.5)
nRBC: 0 % (ref 0.0–0.2)

## 2023-04-03 LAB — TOTAL PROTEIN, URINE DIPSTICK: Protein, ur: NEGATIVE mg/dL

## 2023-04-03 LAB — CEA (IN HOUSE-CHCC): CEA (CHCC-In House): 141.4 ng/mL — ABNORMAL HIGH (ref 0.00–5.00)

## 2023-04-03 MED ORDER — SODIUM CHLORIDE 0.9 % IV SOLN
5.0000 mg/kg | Freq: Once | INTRAVENOUS | Status: AC
  Administered 2023-04-03: 250 mg via INTRAVENOUS
  Filled 2023-04-03: qty 10

## 2023-04-03 MED ORDER — LEUCOVORIN CALCIUM INJECTION 350 MG
400.0000 mg/m2 | Freq: Once | INTRAVENOUS | Status: AC
  Administered 2023-04-03: 600 mg via INTRAVENOUS
  Filled 2023-04-03: qty 25

## 2023-04-03 MED ORDER — OXALIPLATIN CHEMO INJECTION 100 MG/20ML
50.0000 mg/m2 | Freq: Once | INTRAVENOUS | Status: AC
  Administered 2023-04-03: 75 mg via INTRAVENOUS
  Filled 2023-04-03: qty 15

## 2023-04-03 MED ORDER — SODIUM CHLORIDE 0.9% FLUSH
10.0000 mL | Freq: Once | INTRAVENOUS | Status: AC
  Administered 2023-04-03: 10 mL

## 2023-04-03 MED ORDER — FAMOTIDINE IN NACL 20-0.9 MG/50ML-% IV SOLN
20.0000 mg | Freq: Once | INTRAVENOUS | Status: AC
  Administered 2023-04-03: 20 mg via INTRAVENOUS
  Filled 2023-04-03: qty 50

## 2023-04-03 MED ORDER — SODIUM CHLORIDE 0.9% FLUSH
10.0000 mL | INTRAVENOUS | Status: DC | PRN
  Administered 2023-04-03: 10 mL

## 2023-04-03 MED ORDER — PALONOSETRON HCL INJECTION 0.25 MG/5ML
0.2500 mg | Freq: Once | INTRAVENOUS | Status: AC
  Administered 2023-04-03: 0.25 mg via INTRAVENOUS
  Filled 2023-04-03: qty 5

## 2023-04-03 MED ORDER — SODIUM CHLORIDE 0.9 % IV SOLN
10.0000 mg | Freq: Once | INTRAVENOUS | Status: AC
  Administered 2023-04-03: 10 mg via INTRAVENOUS
  Filled 2023-04-03: qty 10

## 2023-04-03 MED ORDER — SODIUM CHLORIDE 0.9 % IV SOLN
1800.0000 mg/m2 | INTRAVENOUS | Status: DC
  Administered 2023-04-03: 2500 mg via INTRAVENOUS
  Filled 2023-04-03: qty 50

## 2023-04-03 MED ORDER — DEXTROSE 5 % IV SOLN: Freq: Once | INTRAVENOUS | Status: AC

## 2023-04-03 MED ORDER — LORATADINE 10 MG PO TABS
10.0000 mg | ORAL_TABLET | Freq: Once | ORAL | Status: AC
  Administered 2023-04-03: 10 mg via ORAL
  Filled 2023-04-03: qty 1

## 2023-04-03 MED ORDER — SODIUM CHLORIDE 0.9 % IV SOLN: Freq: Once | INTRAVENOUS | Status: AC

## 2023-04-03 NOTE — Patient Instructions (Signed)
[facility]  Discharge Instructions: Thank you for choosing [facility] to provide your oncology and hematology care.   If you have a lab appointment with the Cancer Center, please go directly to the Cancer Center and check in at the registration area.   Wear comfortable clothing and clothing appropriate for easy access to any Portacath or PICC line.   We strive to give you quality time with your provider. You may need to reschedule your appointment if you arrive late (15 or more minutes).  Arriving late affects you and other patients whose appointments are after yours.  Also, if you miss three or more appointments without notifying the office, you may be dismissed from the clinic at the provider's discretion.      For prescription refill requests, have your pharmacy contact our office and allow 72 hours for refills to be completed.    Today you received the following chemotherapy and/or immunotherapy agents: MVASI/Oxaliplatin/Leucovorin/Fluorouracil      To help prevent nausea and vomiting after your treatment, we encourage you to take your nausea medication as directed.  BELOW ARE SYMPTOMS THAT SHOULD BE REPORTED IMMEDIATELY: *FEVER GREATER THAN 100.4 F (38 C) OR HIGHER *CHILLS OR SWEATING *NAUSEA AND VOMITING THAT IS NOT CONTROLLED WITH YOUR NAUSEA MEDICATION *UNUSUAL SHORTNESS OF BREATH *UNUSUAL BRUISING OR BLEEDING *URINARY PROBLEMS (pain or burning when urinating, or frequent urination) *BOWEL PROBLEMS (unusual diarrhea, constipation, pain near the anus) TENDERNESS IN MOUTH AND THROAT WITH OR WITHOUT PRESENCE OF ULCERS (sore throat, sores in mouth, or a toothache) UNUSUAL RASH, SWELLING OR PAIN  UNUSUAL VAGINAL DISCHARGE OR ITCHING   Items with * indicate a potential emergency and should be followed up as soon as possible or go to the Emergency Department if any problems should occur.  Please show the CHEMOTHERAPY ALERT CARD or  IMMUNOTHERAPY ALERT CARD at check-in to the Emergency Department and triage nurse.  Should you have questions after your visit or need to cancel or reschedule your appointment, please contact [facility]  Dept: [phone]  and follow the prompts.  Office hours are 8:00 a.m. to 4:30 p.m. Monday - Friday. Please note that voicemails left after 4:00 p.m. may not be returned until the following business day.  We are closed weekends and major holidays. You have access to a nurse at all times for urgent questions. Please call the main number to the clinic Dept: [phone] and follow the prompts.   For any non-urgent questions, you may also contact your provider using MyChart. We now offer e-Visits for anyone 7 and older to request care online for non-urgent symptoms. For details visit [email or web address].   Also download the MyChart app! Go to the app store, search "MyChart", open the app, select [place], and log in with your MyChart username and password.

## 2023-04-05 ENCOUNTER — Inpatient Hospital Stay: Payer: Federal, State, Local not specified - PPO

## 2023-04-05 VITALS — BP 147/81 | HR 86 | Temp 98.1°F | Resp 16

## 2023-04-05 DIAGNOSIS — C20 Malignant neoplasm of rectum: Secondary | ICD-10-CM

## 2023-04-05 DIAGNOSIS — Z5112 Encounter for antineoplastic immunotherapy: Secondary | ICD-10-CM | POA: Diagnosis not present

## 2023-04-05 MED ORDER — SODIUM CHLORIDE 0.9% FLUSH
10.0000 mL | INTRAVENOUS | Status: DC | PRN
  Administered 2023-04-05: 10 mL

## 2023-04-05 MED ORDER — PEGFILGRASTIM-JMDB 6 MG/0.6ML ~~LOC~~ SOSY
6.0000 mg | PREFILLED_SYRINGE | Freq: Once | SUBCUTANEOUS | Status: AC
  Administered 2023-04-05: 6 mg via SUBCUTANEOUS

## 2023-04-05 MED ORDER — HEPARIN SOD (PORK) LOCK FLUSH 100 UNIT/ML IV SOLN
500.0000 [IU] | Freq: Once | INTRAVENOUS | Status: AC | PRN
  Administered 2023-04-05: 500 [IU]

## 2023-04-07 ENCOUNTER — Other Ambulatory Visit: Payer: Self-pay | Admitting: Nurse Practitioner

## 2023-04-07 DIAGNOSIS — C182 Malignant neoplasm of ascending colon: Secondary | ICD-10-CM

## 2023-04-09 ENCOUNTER — Ambulatory Visit (INDEPENDENT_AMBULATORY_CARE_PROVIDER_SITE_OTHER): Payer: Federal, State, Local not specified - PPO | Admitting: Podiatry

## 2023-04-09 ENCOUNTER — Encounter: Payer: Self-pay | Admitting: Podiatry

## 2023-04-09 DIAGNOSIS — T451X5A Adverse effect of antineoplastic and immunosuppressive drugs, initial encounter: Secondary | ICD-10-CM

## 2023-04-09 DIAGNOSIS — G620 Drug-induced polyneuropathy: Secondary | ICD-10-CM

## 2023-04-09 DIAGNOSIS — L84 Corns and callosities: Secondary | ICD-10-CM | POA: Diagnosis not present

## 2023-04-09 DIAGNOSIS — M79674 Pain in right toe(s): Secondary | ICD-10-CM

## 2023-04-09 DIAGNOSIS — B351 Tinea unguium: Secondary | ICD-10-CM

## 2023-04-09 DIAGNOSIS — M79675 Pain in left toe(s): Secondary | ICD-10-CM | POA: Diagnosis not present

## 2023-04-09 NOTE — Progress Notes (Signed)
Subjective:  Patient ID: Brittney Spencer, female    DOB: [birth year],   MRN: [id number]  Chief Complaint  Patient presents with   Nail Problem    RFC.    72 y.o. female presents for concern of thickened elongated and painful nails that are difficult to trim. Requesting to have them trimmed today. Relates burning and tingling in their feet. Patient has neuropathy from chemotherapy.   PCP:  Patient, No Pcp Per    . Denies any other pedal complaints. Denies n/v/f/c.   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 Some residual intraretinal fluid remains, very small localized subfoveal of fluid remains although this continues to slowly resorb. We'll continue to observe.   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 CFT <3 seconds. Normal hair growth on digits. No edema.  Skin. No lacerations or abrasions bilateral feet. Hyperkeratotic lesions noted plantar first and fifth metatarsal heads bilateral as well as medial eminence bilateral. Nails 1-5 are thickened discolored and elongated bilateral Musculoskeletal: MMT 5/5 bilateral lower extremities in DF, PF, Inversion and Eversion. Deceased ROM in DF of ankle joint.  Neurological: Sensation intact to light touch.   Assessment:   1. Pain due to onychomycosis of toenails of  both feet   2. Peripheral neuropathy due to chemotherapy [facility])      Plan:  Patient was evaluated and treated and all questions answered. -Discussed and educated patient on foot care, especially with  regards to the vascular, neurological and musculoskeletal systems.  -Discussed supportive shoes at all times and checking feet regularly.  -Mechanically debrided all nails 1-5 bilateral using sterile nail nipper and filed with dremel without incident as courtesy -Hyperkeratotic tissue debrided without incident as courtesy.  -Information given on supportive shoes.  -Answered all patient questions -Patient to return  in 3 months for at risk foot care -Patient advised to call the office if any problems or questions arise in the meantime.   Louann Sjogren, DPM

## 2023-04-10 ENCOUNTER — Other Ambulatory Visit: Payer: Federal, State, Local not specified - PPO

## 2023-04-10 ENCOUNTER — Ambulatory Visit: Payer: Federal, State, Local not specified - PPO | Admitting: Hematology

## 2023-04-10 ENCOUNTER — Other Ambulatory Visit: Payer: Self-pay

## 2023-04-10 ENCOUNTER — Ambulatory Visit: Payer: Federal, State, Local not specified - PPO

## 2023-04-14 ENCOUNTER — Other Ambulatory Visit: Payer: Self-pay | Admitting: Hematology

## 2023-04-14 ENCOUNTER — Ambulatory Visit (HOSPITAL_COMMUNITY)
Admission: RE | Admit: 2023-04-14 | Discharge: 2023-04-14 | Disposition: A | Discharge status: Home / Self Care | Payer: Federal, State, Local not specified - PPO | Source: Ambulatory Visit | Attending: Nurse Practitioner | Admitting: Nurse Practitioner

## 2023-04-14 ENCOUNTER — Encounter (HOSPITAL_COMMUNITY): Payer: Self-pay

## 2023-04-14 DIAGNOSIS — C787 Secondary malignant neoplasm of liver and intrahepatic bile duct: Secondary | ICD-10-CM | POA: Diagnosis present

## 2023-04-14 DIAGNOSIS — C20 Malignant neoplasm of rectum: Secondary | ICD-10-CM | POA: Insufficient documentation

## 2023-04-14 DIAGNOSIS — C182 Malignant neoplasm of ascending colon: Secondary | ICD-10-CM | POA: Insufficient documentation

## 2023-04-14 MED ORDER — IOHEXOL 9 MG/ML PO SOLN
500.0000 mL | ORAL | Status: AC
  Administered 2023-04-14 (×2): 500 mL via ORAL

## 2023-04-14 MED ORDER — IOHEXOL 300 MG/ML  SOLN
100.0000 mL | Freq: Once | INTRAMUSCULAR | Status: AC | PRN
  Administered 2023-04-14: 100 mL via INTRAVENOUS

## 2023-04-14 MED ORDER — SODIUM CHLORIDE (PF) 0.9 % IJ SOLN
INTRAMUSCULAR | Status: AC
  Filled 2023-04-14: qty 50

## 2023-04-16 ENCOUNTER — Telehealth: Payer: Self-pay | Admitting: Hematology

## 2023-04-16 MED FILL — Dexamethasone Sodium Phosphate Inj 100 MG/10ML: INTRAMUSCULAR | Qty: 1 | Status: AC

## 2023-04-17 ENCOUNTER — Other Ambulatory Visit: Payer: Federal, State, Local not specified - PPO

## 2023-04-17 ENCOUNTER — Ambulatory Visit: Payer: Federal, State, Local not specified - PPO | Admitting: Hematology

## 2023-04-17 ENCOUNTER — Ambulatory Visit: Payer: Federal, State, Local not specified - PPO

## 2023-04-19 ENCOUNTER — Inpatient Hospital Stay: Payer: Federal, State, Local not specified - PPO

## 2023-04-23 NOTE — Assessment & Plan Note (Signed)
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  -She began first-line chemo with dose-reduced FOLFIRI and Beva in 2/22.  -we changed to FOLFOX with continued Beva on 03/28/22. She tolerated well overall with some fatigue and cold sensitivity. -I personally reviewed her restaging CT from 10/07/2022 which showed overall SD in liver and rectum, stable and indermininate lung nodules, no other new lesions  -She is tolerating chemotherapy overall well, will continue. -I personally reviewed her restaging CT scan from October 07, 2022, which showed overall stable disease.  She has multiple small lung nodules, indeterminate, including one 7 mm nodule, will continue monitoring. 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 -Restaging CT scan on April 14, 2023 unfortunately showed disease progression in liver. -I discussed the third line chemotherapy options, I recommend Lonsurf and bevacizumab -will request NGS [facility] on her rectal tumor biopsy in 2021

## 2023-04-24 ENCOUNTER — Ambulatory Visit: Payer: Federal, State, Local not specified - PPO | Admitting: Hematology

## 2023-04-24 ENCOUNTER — Inpatient Hospital Stay: Payer: Federal, State, Local not specified - PPO

## 2023-04-24 ENCOUNTER — Other Ambulatory Visit: Payer: Self-pay

## 2023-04-24 ENCOUNTER — Telehealth: Payer: Self-pay | Admitting: Pharmacy Technician

## 2023-04-24 ENCOUNTER — Other Ambulatory Visit (HOSPITAL_COMMUNITY): Payer: Self-pay

## 2023-04-24 ENCOUNTER — Encounter: Payer: Self-pay | Admitting: Hematology

## 2023-04-24 ENCOUNTER — Ambulatory Visit: Payer: Federal, State, Local not specified - PPO

## 2023-04-24 ENCOUNTER — Inpatient Hospital Stay (HOSPITAL_BASED_OUTPATIENT_CLINIC_OR_DEPARTMENT_OTHER): Payer: Federal, State, Local not specified - PPO | Admitting: Hematology

## 2023-04-24 ENCOUNTER — Telehealth: Payer: Self-pay | Admitting: Pharmacist

## 2023-04-24 ENCOUNTER — Other Ambulatory Visit: Payer: Self-pay | Admitting: Hematology

## 2023-04-24 ENCOUNTER — Other Ambulatory Visit: Payer: Federal, State, Local not specified - PPO

## 2023-04-24 VITALS — BP 152/86 | HR 95 | Temp 98.4°F | Resp 18 | Ht 66.0 in | Wt 108.0 lb

## 2023-04-24 DIAGNOSIS — Z5112 Encounter for antineoplastic immunotherapy: Secondary | ICD-10-CM | POA: Diagnosis not present

## 2023-04-24 DIAGNOSIS — C20 Malignant neoplasm of rectum: Secondary | ICD-10-CM

## 2023-04-24 DIAGNOSIS — Z95828 Presence of other vascular implants and grafts: Secondary | ICD-10-CM

## 2023-04-24 DIAGNOSIS — D500 Iron deficiency anemia secondary to blood loss (chronic): Secondary | ICD-10-CM

## 2023-04-24 DIAGNOSIS — C787 Secondary malignant neoplasm of liver and intrahepatic bile duct: Secondary | ICD-10-CM | POA: Diagnosis not present

## 2023-04-24 LAB — CMP (CANCER CENTER ONLY)
ALT: 10 U/L (ref 0–44)
AST: 21 U/L (ref 15–41)
Albumin: 3.5 g/dL (ref 3.5–5.0)
Alkaline Phosphatase: 109 U/L (ref 38–126)
Anion gap: 5 (ref 5–15)
BUN: 12 mg/dL (ref 8–23)
CO2: 28 mmol/L (ref 22–32)
Calcium: 9 mg/dL (ref 8.9–10.3)
Chloride: 105 mmol/L (ref 98–111)
Creatinine: 0.66 mg/dL (ref 0.44–1.00)
GFR, Estimated: >60 mL/min (ref >60)
Glucose, Bld: 87 mg/dL (ref 70–99)
Potassium: 4.1 mmol/L (ref 3.5–5.1)
Sodium: 138 mmol/L (ref 135–145)
Total Bilirubin: 0.4 mg/dL (ref 0.3–1.2)
Total Protein: 7.2 g/dL (ref 6.5–8.1)

## 2023-04-24 LAB — CBC WITH DIFFERENTIAL (CANCER CENTER ONLY)
Abs Immature Granulocytes: 0.01 10*3/uL (ref 0.00–0.07)
Basophils Absolute: 0 10*3/uL (ref 0.0–0.1)
Basophils Relative: 1 %
Eosinophils Absolute: 0.1 10*3/uL (ref 0.0–0.5)
Eosinophils Relative: 2 %
HCT: 32.5 % — ABNORMAL LOW (ref 36.0–46.0)
Hemoglobin: 10.1 g/dL — ABNORMAL LOW (ref 12.0–15.0)
Immature Granulocytes: 0 %
Lymphocytes Relative: 24 %
Lymphs Abs: 0.8 10*3/uL (ref 0.7–4.0)
MCH: 28.2 pg (ref 26.0–34.0)
MCHC: 31.1 g/dL (ref 30.0–36.0)
MCV: 90.8 fL (ref 80.0–100.0)
Monocytes Absolute: 0.5 10*3/uL (ref 0.1–1.0)
Monocytes Relative: 14 %
Neutro Abs: 2 10*3/uL (ref 1.7–7.7)
Neutrophils Relative %: 59 %
Platelet Count: 134 10*3/uL — ABNORMAL LOW (ref 150–400)
RBC: 3.58 MIL/uL — ABNORMAL LOW (ref 3.87–5.11)
RDW: 14.6 % (ref 11.5–15.5)
WBC Count: 3.4 10*3/uL — ABNORMAL LOW (ref 4.0–10.5)
nRBC: 0 % (ref 0.0–0.2)

## 2023-04-24 LAB — TOTAL PROTEIN, URINE DIPSTICK: Protein, ur: NEGATIVE mg/dL

## 2023-04-24 LAB — FERRITIN: Ferritin: 54 ng/mL (ref 11–307)

## 2023-04-24 LAB — CEA (IN HOUSE-CHCC): CEA (CHCC-In House): 188.43 ng/mL — ABNORMAL HIGH (ref 0.00–5.00)

## 2023-04-24 MED ORDER — TRIFLURIDINE-TIPIRACIL 20-8.19 MG PO TABS: ORAL_TABLET | ORAL | 1 refills | Status: DC

## 2023-04-24 MED ORDER — SODIUM CHLORIDE 0.9% FLUSH
10.0000 mL | Freq: Once | INTRAVENOUS | Status: AC
  Administered 2023-04-24: 10 mL

## 2023-04-24 NOTE — Telephone Encounter (Signed)
Oral Oncology Patient Advocate Encounter  Prior Authorization for Brittney Spencer has been approved.    PA#  [id number] Effective dates: 04/24/23 through 04/23/24  Patients co-pay is $0.    Jinger Neighbors, CPhT-Adv Oncology Pharmacy Patient Advocate [facility] Direct Number: [phone]  Fax: [phone]

## 2023-04-24 NOTE — Progress Notes (Signed)
DISCONTINUE ON PATHWAY REGIMEN - Colorectal     A cycle is every 14 days:     Bevacizumab-xxxx      Oxaliplatin      Leucovorin      Fluorouracil      Fluorouracil   **Always confirm dose/schedule in your pharmacy ordering system**  REASON: Disease Progression PRIOR TREATMENT: [id number]: mFOLFOX6 + Bevacizumab q14 Days TREATMENT RESPONSE: Partial Response (PR)  START ON PATHWAY REGIMEN - Colorectal     A cycle is every 28 days:     Trifluridine and tipiracil      Bevacizumab-xxxx   **Always confirm dose/schedule in your pharmacy ordering system**  Patient Characteristics: Distant Metastases, Nonsurgical Candidate, Non-KRAS G12C, RAS Mutation Positive/Unknown (BRAF V600 Wild-Type/Unknown), Standard Cytotoxic Therapy, Third Line Standard Cytotoxic Therapy Tumor Location: Rectal Therapeutic Status: Distant Metastases Microsatellite/Mismatch Repair Status: MSS/pMMR BRAF Mutation Status: Wild-Type (no mutation) KRAS/NRAS Mutation Status: Non-KRAS G12C, RAS Mutation Positive Preferred Therapy Approach: Standard Cytotoxic Therapy Standard Cytotoxic Line of Therapy: Third Careers adviser Cytotoxic Therapy Intent of Therapy: Non-Curative / Palliative Intent, Discussed with Patient

## 2023-04-24 NOTE — Progress Notes (Signed)
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  -She began first-line chemo with dose-reduced FOLFIRI and Beva in 2/22.  -we changed to FOLFOX with continued Beva on 03/28/22. She tolerated well overall with some fatigue and cold sensitivity. -I personally reviewed her restaging CT from 10/07/2022 which showed overall SD in liver and rectum, stable and indermininate lung nodules, no other new lesions  -She is tolerating chemotherapy overall well, will continue. -I personally reviewed her restaging CT scan from October 07, 2022, which showed overall stable disease.  She has multiple small lung nodules, indeterminate, including one 7 mm nodule, will continue monitoring. 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 -Restaging CT scan on April 14, 2023 unfortunately showed disease progression in liver. -I discussed the third line chemotherapy options, I recommend Lonsurf and bevacizumab -will request NGS [facility] on her rectal tumor biopsy in 2021    Assessment and Plan    Metastatic  Colorectal Cancer -I reviewed her recent restaging CT scan, which showed that liver lesions have increased in size, indicating some resistance to current treatment. Neuropathy present in hands and feet is also at dose-limiting toxicity for oxaliplatin. -Discontinue current treatment due to cancer progression and neuropathy. -I discussed other treatment options.  I recommend Lonsurf (oral chemotherapy) and Bevacizumab (biological agent) regimen. Lonsurf to be taken Monday through Friday for two weeks, then off for two weeks. Bevacizumab to be administered intravenously every two weeks.  Potential side effect from Lonsurf were discussed with her in detail, she agrees to proceed. -I also reviewed other options such as Stivarga -Order comprehensive panel ([facility] or [facility] 1) to sequence tumor for potential targetable mutations. Results expected in approximately three weeks. -Plan to repeat scan in three months to assess response to new treatment regimen. -Consider future treatment options such as Stivarga (Regorafenib) if necessary. -Encourage patient to report any issues with new oral medication.     Plan -CT scan reviewed, which showed disease progression in liver -Will cancel her treatment today, change to Lonsurf and bevacizumab, start on May 05, 2023 -I will order pharmacist Lurena Joiner will call her   SUMMARY OF ONCOLOGIC HISTORY: Oncology History Overview Note  Cancer Staging Cancer of ascending colon [facility]) Staging form: Colon and Rectum, AJCC 7th Edition - Clinical stage from 02/07/2016: Stage IIIC (T4b, N1b, M0) - Signed by [name], MD on 03/04/2016 Laterality: Right Residual tumor (R): R2 - Macroscopic    Cancer of ascending colon (HCC)  10/25/2015 Imaging   CT ABD/PELVIS:  Inflammatory changes inferior to the cecal tip appear improved, there is still irregular soft tissue thickening of the cecal tip, and there are adjacent prominent  lymph nodes in the ileocolonic mesentery,  measuring 13 mm on image 49 and 8 mm on image 52. In addition, there is a 2.5 x 1.8 cm nodule on image 46 which has central low density. Therefore, these findings are moderately suspicious for an underlying cecal malignancy with perforation.    01/19/2016 Procedure   COLONOSCOPY per Dr. [name]: Fungating, ulcerated mass almost obstructing mid ascending colon   01/19/2016 Initial Biopsy   Diagnosis Surgical [P], cecal mass - INVASIVE ADENOCARCINOMA WITH ULCERATION. - SEE COMMENT.   02/05/2016 Tumor Marker   Patient's tumor was tested for the following markers: CEA Results of the tumor marker test revealed 5.7.   02/07/2016 Initial Diagnosis   Cancer of ascending colon (HCC)   02/07/2016 Definitive Surgery   Laparoscopic assisted right hemicolectomy and right salpingo oopherectomy--[name]   02/07/2016 Pathologic Stage   p T4 N1b   2/43 nodes +   02/07/2016 Pathology Results   MMR normal; G2 adenocarcinoma;proximal & distal margins negative; soft tissue mass on pelvic sidewall + for adenocarcinoma with positive margin MSI Stable   03/08/2016 Imaging   CT chest negative for metastasis.    03/19/2016 - 04/25/2016 Radiation Therapy   Adjuvant irradiation, 50 gray in 28 fractions   03/19/2016 - 04/22/2016 Chemotherapy   Xeloda 1500 mg twice daily, started on 03/19/2016, dose reduced to 1000 mg twice daily from week 3 due to neutropenia, and patient stopped 3 days before last dose radiation due to difficulty swallowing the pill    05/20/2016 -  Adjuvant Chemotherapy   Patient declined adjuvant chemotherapy   09/16/2016 Imaging   CT CAP w Contrast 1. No evidence of local tumor recurrence at the ileocolic anastomosis. 2. No findings suspicious for metastatic disease in the chest, abdomen or pelvis. 3. Nonspecific trace free fluid in the pelvic cul-de-sac. 4. Stable solitary 3 mm right upper lobe pulmonary nodule, for which 6 month stability has been demonstrated, probably benign. 5.  Additional findings include stable right posterior pericardial cyst and small calcified uterine fibroids.   05/13/2017 Imaging   CT CAP W Contrast 05/13/17 IMPRESSION: 1. No current findings of residual or recurrent malignancy. 2. Mild prominence of stool throughout the colon. Nondistended portions of the rectum. 3. Several tiny pulmonary nodules are stable from the earliest available comparison of 03/08/2016 and probably benign, but may merit surveillance. 4. Other imaging findings of potential clinical significance: Old granulomatous disease. Aortoiliac atherosclerotic vascular disease. Lumbar spondylosis and degenerative disc disease. Stable amount of trace free pelvic fluid.   04/27/2018 Imaging   04/27/2018 CT CAP IMPRESSION: Stable exam. No evidence of recurrent or metastatic carcinoma within the chest, abdomen, or pelvis   04/19/2019 Imaging   CT CAP W Contrast  IMPRESSION: Chest Impression:   1. No evidence of thoracic metastasis. 2. Stable small bilateral pulmonary nodules.   Abdomen / Pelvis Impression:   1. No evidence local colorectal carcinoma recurrence or metastasis in the abdomen pelvis. 2. Post RIGHT hemicolectomy.   01/22/2021 Imaging   CT CAP  IMPRESSION: CT CHEST IMPRESSION   1. Similar nonspecific pulmonary nodules. 2. New posterior left upper lobe reticulonodular opacity, suspicious for interval mild infection or inflammation. 3. No thoracic adenopathy.   CT ABDOMEN AND PELVIS IMPRESSION   1. Further decrease in size of high left hepatic lobe 3 mm low-density lesion. No new or progressive metastatic disease within the abdomen or pelvis. 2. Similar trace free pelvic fluid. 3. Similar nonspecific mid rectal wall thickening. 4.  Aortic Atherosclerosis (  ICD10-I70.0).   04/23/2021 Imaging   CT CAP  IMPRESSION: 1. Treated metastatic lesion between segments 2 and 3 of the liver, slightly smaller and less distinct than prior examination. No  other signs of definite metastatic disease elsewhere in the abdomen or pelvis. 2. Multiple small pulmonary nodules, stable compared to the prior examination, favored to be benign. No definitive findings to suggest metastatic disease to the thorax. 3. Aortic atherosclerosis. 4. Additional incidental findings, as above.   08/13/2021 Imaging   EXAM: CT CHEST, ABDOMEN, AND PELVIS WITH CONTRAST  IMPRESSION: 1. A previously seen PET avid lesion of the anterior left lobe of the liver, hepatic segment II, is no longer discretely appreciable consistent with treatment response of a hepatic metastasis. 2. No evidence of new metastatic disease in the chest, abdomen, or pelvis. 3. Interval increase in a small focus of consolidation and nodularity of the medial left upper lobe, consistent with minimal, ongoing atypical infection. Additional tiny bilateral pulmonary nodules are stable and almost certainly incidental benign. Attention on follow-up. 4. Status post right hemicolectomy and ileocolic anastomosis.   07/02/2022 Imaging    IMPRESSION: CHEST IMPRESSION:   1. No evidence of thoracic metastasis. 2. Stable small pulmonary nodules.   PELVIS IMPRESSION:   1. Stable to slight decrease in size of subcapsular lesion in the RIGHT hepatic lobe. 2. No evidence of new or progressive disease in the abdomen pelvis. 3.  Aortic Atherosclerosis (ICD10-I70.0).     12/31/2022 Imaging    IMPRESSION: 1. Right hepatic lobe metastasis has undergone mild-to-moderate enlargement since 10/07/2022. A left hepatic lobe 5 mm lesion is not readily apparent on the prior, suspicious for a new metastasis. 2. Similar nonspecific tiny pulmonary nodules. 3. Similar amorphous soft tissue thickening within the posterior superior right hemipelvis, hypermetabolic on prior PET and indeterminate for residual disease versus scarring. 4. Similar equivocal soft tissue fullness within the rectum. 5. Increased size of an  abdominopelvic ventral wall nodule for which subcutaneous metastasis are a concern. 6. New trace right pelvic fluid. 7. Incidental findings, including: Aortic Atherosclerosis (ICD10-I70.0). 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 - Patent side-to-side ileo-colonic anastomosis, characterized by healthy appearing mucosa. - The examined portion of the ileum was normal. - One diminutive polyp in the proximal transverse colon, removed with a cold biopsy forceps. Resected and retrieved. - Likely malignant partially obstructing tumor in the mid rectum. Biopsied. Tattooed. - The examination was otherwise normal on direct and retroflexion views.   06/15/2020 Initial Biopsy   Diagnosis 1. Transverse Colon Polyp - HYPERPLASTIC POLYP 2. Rectum, biopsy - ADENOCARCINOMA ARISING IN A TUBULAR ADENOMA WITH HIGH-GRADE DYSPLASIA. SEE NOTE Diagnosis Note 2. Dr. Luisa Hart reviewed the case and concurs with the diagnosis. Dr. [name] was notified on 06/16/2020.   06/28/2020 Imaging   CT CAP  IMPRESSION: 1. New low-density focus in the anterior aspect of the lateral segment LEFT hepatic lobe measuring 1.2 x 1.0 cm, compatible with small metastatic lesion in the LEFT hepatic lobe. 2. Soft tissue in the RIGHT iliac fossa following RIGHT hemicolectomy invades the psoas musculature and is slowly enlarging over time, more linear on the prior study now highly concerning for recurrence/metastasis to this location. 3. Signs of enteritis, potentially post radiation changes of the small bowel. Tethered small  bowel in the RIGHT lower quadrant shows focal thickening and narrowing suspicious for small bowel involvement and developing partial  obstruction though currently contrast passes beyond this point into the colon. 4. Rectal thickening in this patient with known rectal mass as described. 5. No evidence of metastatic disease in the chest. 6. Stable small pulmonary nodules. 7.  and aortic atherosclerosis.   Aortic Atherosclerosis (ICD10-I70.0) and Emphysema (ICD10-J43.9).   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 Exam positive for FDG avid rectal tumor which corresponds to the recent colonoscopy findings. 2. FDG avid soft tissue mass within the right iliac fossa is noted and consistent with local tumor recurrence from previous ascending colon tumor. 3. Lateral segment left lobe of liver lesion is FDG avid concerning for liver metastasis. 4. No specific findings identified to suggest metastatic disease to the chest.   08/10/2020 -  Chemotherapy   First-line FOLFIRI q2weeks starting 08/10/20. dose reduced with cycle 1. Irinotecan/5FU increased and Bevacizumab added with cycle 2 on 08/23/2020    08/17/2020 - 03/09/2022 Chemotherapy   Patient is on Treatment Plan : COLORECTAL FOLFIRI + Bevacizumab q14d     10/27/2020 Imaging   CT CAP  IMPRESSION: 1. Interval decrease in size of the hypermetabolic left hepatic lesion, consistent with metastatic disease. No new liver lesion evident. 2. Interval resolution of the hypermetabolic soft tissue lesion along the right iliac fossa with no measurable soft tissue lesion remaining at this location today. 3. Similar appearance of soft tissue fullness in the rectum at the site of the hypermetabolic lesion seen previously. 4. Stable tiny bilateral pulmonary nodules. Continued attention on follow-up recommended. 5. Small volume free fluid in the pelvis. 6. Aortic Atherosclerosis (ICD10-I70.0).   01/22/2021 Imaging   CT CAP  IMPRESSION: CT CHEST IMPRESSION   1. Similar nonspecific pulmonary nodules. 2. New  posterior left upper lobe reticulonodular opacity, suspicious for interval mild infection or inflammation. 3. No thoracic adenopathy.   CT ABDOMEN AND PELVIS IMPRESSION   1. Further decrease in size of high left hepatic lobe 3 mm low-density lesion. No new or progressive metastatic disease within the abdomen or pelvis. 2. Similar trace free pelvic fluid. 3. Similar nonspecific mid rectal wall thickening. 4.  Aortic Atherosclerosis (ICD10-I70.0).   04/23/2021 Imaging   CT CAP  IMPRESSION: 1. Treated metastatic lesion between segments 2 and 3 of the liver, slightly smaller and less distinct than prior examination. No other signs of definite metastatic disease elsewhere in the abdomen or pelvis. 2. Multiple small pulmonary nodules, stable compared to the prior examination, favored to be benign. No definitive findings to suggest metastatic disease to the thorax. 3. Aortic atherosclerosis. 4. Additional incidental findings, as above.   08/13/2021 Imaging   EXAM: CT CHEST, ABDOMEN, AND PELVIS WITH CONTRAST  IMPRESSION: 1. A previously seen PET avid lesion of the anterior left lobe of the liver, hepatic segment II, is no longer discretely appreciable consistent with treatment response of a hepatic metastasis. 2. No evidence of new metastatic disease in the chest, abdomen, or pelvis. 3. Interval increase in a small focus of consolidation and nodularity of the medial left upper lobe, consistent with minimal, ongoing atypical infection. Additional tiny bilateral pulmonary nodules are stable and almost certainly incidental benign. Attention on follow-up. 4. Status post right hemicolectomy and ileocolic anastomosis.   03/28/2022 - 04/05/2023 Chemotherapy   Patient is on Treatment Plan : COLORECTAL FOLFOX + Bevacizumab q14d  07/02/2022 Imaging    IMPRESSION: CHEST IMPRESSION:   1. No evidence of thoracic metastasis. 2. Stable small pulmonary nodules.   PELVIS IMPRESSION:   1.  Stable to slight decrease in size of subcapsular lesion in the RIGHT hepatic lobe. 2. No evidence of new or progressive disease in the abdomen pelvis.   10/07/2022 Imaging    IMPRESSION: 1. Stable hypovascular mass in the periphery of the right lobe of the liver, which likely represents a treated metastatic lesion. Stable subcentimeter lesion just medial to this is also similar to the recent prior examination. No new hepatic lesions are otherwise noted. 2. Persistent but stable poorly defined soft tissue thickening in the right lower quadrant associated with multiple small bowel loops and the overlying iliopsoas musculature, which corresponds to focal hypermetabolism on remote prior PET-CT. Given the stability, this likely represents a treated metastatic lesion. Continued attention on follow-up studies is recommended. 3. Persistent mass-like thickening in the proximal rectum corresponding to previously noted hypermetabolic rectal neoplasm on prior PET-CT. This currently measures approximately 2.8 x 2.3 cm and is slightly more apparent than the most recent prior study. 4. Multiple small pulmonary nodules generally stable compared to the prior study, with exception of a new branching nodule in the anterior aspect of the left upper lobe measuring 7 x 3 mm (mean diameter 5 mm). This is nonspecific. Close attention on follow-up studies is recommended to ensure stability. 5. Aortic atherosclerosis. 6. Additional incidental findings, as above.     12/31/2022 Imaging    IMPRESSION: 1. Right hepatic lobe metastasis has undergone mild-to-moderate enlargement since 10/07/2022. A left hepatic lobe 5 mm lesion is not readily apparent on the prior, suspicious for a new metastasis. 2. Similar nonspecific tiny pulmonary nodules. 3. Similar amorphous soft tissue thickening within the posterior superior right hemipelvis, hypermetabolic on prior PET and indeterminate for residual disease versus  scarring. 4. Similar equivocal soft tissue fullness within the rectum. 5. Increased size of an abdominopelvic ventral wall nodule for which subcutaneous metastasis are a concern. 6. New trace right pelvic fluid. 7. Incidental findings, including: Aortic Atherosclerosis (ICD10-I70.0). Possible constipation   05/05/2023 -  Chemotherapy   Patient is on Treatment Plan : COLORECTAL Bevacizumab + Trifluridine/Tipiracil q28d        Discussed the use of AI scribe software for clinical note transcription with the patient, who gave verbal consent to proceed.  History of Present Illness   A 72 year old patient with a history of metastatic colon cancer presents for a follow-up visit. The patient has been on oxaliplatin treatment, but recent scans show a slight increase in the size of the spots in the liver, suggesting some resistance to the drug. The patient also reports experiencing neuropathy, a known side effect of oxaliplatin. The patient has been otherwise stable, with no new symptoms or changes in health status. The patient has been active, visiting family and friends, and has even gained a couple of pounds.         All other systems were reviewed with the patient and are negative.  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 Some residual intraretinal fluid remains, very small localized subfoveal of fluid remains although this continues to  slowly resorb. We'll continue to observe.   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  ALLERGIES:  is allergic to venofer [iron sucrose], fish allergy, peanut-containing drug products, soy allergy, and buspirone.  MEDICATIONS:  Current Outpatient Medications  Medication Sig Dispense Refill   trifluridine-tipiracil (LONSURF) 20-8.19 MG tablet Take 2 tablets in morning and 3 tabs in evening. Take within 1 hr after AM & PM meals on days 1-5, 8-12. Repeat every 28 days. 50 tablet 1   ALPRAZolam (XANAX) 0.25 MG tablet Take 1 tablet (0.25 mg total) by mouth daily as needed for anxiety. 30 tablet 0   Cholecalciferol (VITAMIN D3 PO) Take by mouth daily.     docusate sodium (COLACE) 100 MG capsule 1 capsule as needed     gabapentin (NEURONTIN) 100 MG capsule Take 1 capsule (100 mg total) by mouth at bedtime. 30 capsule 1   lidocaine-prilocaine (EMLA) cream Apply 1 Application topically as needed. 30 g 1   NORVASC 2.5 MG tablet TAKE 1 TABLET BY MOUTH EVERY DAY 90 tablet 1   ondansetron (ZOFRAN) 8 MG tablet Take 1 tablet (8 mg total) by mouth every 8 (eight) hours as needed for nausea or vomiting. 20 tablet 2   prochlorperazine (COMPAZINE) 10 MG tablet Take 1 tablet (10 mg total) by mouth every 6 (six) hours as needed for nausea or vomiting. 30 tablet 2   Current  Facility-Administered Medications  Medication Dose Route Frequency Provider Last Rate Last Admin   0.9 %  sodium chloride infusion  500 mL Intravenous Once Danis, Starr Lake III, MD        PHYSICAL EXAMINATION: ECOG PERFORMANCE STATUS: 1 - Symptomatic but completely ambulatory  Vitals:   04/24/23 0838  BP: (!) 152/86  Pulse: 95  Resp: 18  Temp: 98.4 F (36.9 C)  SpO2: 100%   Wt Readings from Last 3 Encounters:  04/24/23 108 lb (49 kg)  04/03/23 106 lb 4 oz (48.2 kg)  03/13/23 105 lb 6.4 oz (47.8 kg)     GENERAL:alert, no distress and comfortable SKIN: skin color, texture, turgor are normal, no rashes or significant lesions EYES: normal, Conjunctiva are pink and non-injected, sclera clear NECK: supple, thyroid normal size, non-tender, without nodularity LYMPH:  no palpable lymphadenopathy in the cervical, axillary  LUNGS: clear to auscultation and percussion with normal breathing effort HEART: regular rate & rhythm and no murmurs and no lower extremity edema ABDOMEN:abdomen soft, non-tender and normal bowel sounds Musculoskeletal:no cyanosis of digits and no clubbing  NEURO: alert &  oriented x 3 with fluent speech, no focal motor/sensory deficits    LABORATORY DATA:  I have reviewed the data as listed    Latest Ref Rng & Units 04/24/2023    8:15 AM 04/03/2023    8:00 AM 03/13/2023    9:15 AM  CBC  WBC 4.0 - 10.5 K/uL 3.4  3.4  4.9   Hemoglobin 12.0 - 15.0 g/dL 65.7  84.6  96.2   Hematocrit 36.0 - 46.0 % 32.5  32.5  33.2   Platelets 150 - 400 K/uL 134  145  113         Latest Ref Rng & Units 04/24/2023    8:15 AM 04/03/2023    8:00 AM 03/13/2023    9:15 AM  CMP  Glucose 70 - 99 mg/dL 87  87  98   BUN 8 - 23 mg/dL 12  12  9    Creatinine 0.44 - 1.00 mg/dL 9.52  8.41  3.24   Sodium 135 - 145 mmol/L 138  137  136   Potassium 3.5 - 5.1 mmol/L 4.1  4.1  4.0   Chloride 98 - 111 mmol/L 105  105  103   CO2 22 - 32 mmol/L 28  27  29    Calcium 8.9 - 10.3 mg/dL 9.0  8.9   8.7   Total Protein 6.5 - 8.1 g/dL 7.2  7.2  6.8   Total Bilirubin 0.3 - 1.2 mg/dL 0.4  0.3  0.4   Alkaline Phos 38 - 126 U/L 109  102  95   AST 15 - 41 U/L 21  23  17    ALT 0 - 44 U/L 10  10  7        RADIOGRAPHIC STUDIES: I have personally reviewed the radiological images as listed and agreed with the findings in the report. No results found.    Orders Placed This Encounter  Procedures   Consent Attestation for Oncology Treatment    Order Specific Question:   The patient is informed of risks, benefits, side-effects of the prescribed oncology treatment. Potential short term and long term side effects and response rates discussed. After a long discussion, the patient made informed decision to proceed.    Answer:   Yes   CBC with Differential (Cancer Center Only)    Standing Status:   Future    Standing Expiration Date:   05/04/2024   Total Protein, Urine dipstick    Standing Status:   Future    Standing Expiration Date:   05/04/2024   CBC with Differential (Cancer Center Only)    Standing Status:   Future    Standing Expiration Date:   05/18/2024   CBC with Differential (Cancer Center Only)    Standing Status:   Future    Standing Expiration Date:   06/01/2024   CBC with Differential (Cancer Center Only)    Standing Status:   Future    Standing Expiration Date:   06/15/2024   PHYSICIAN COMMUNICATION ORDER    UA Protein, dipstick required prior to every 3rd cycle bevacizumab   All questions were answered. The patient knows to call the clinic with any problems, questions or concerns. No barriers to learning was detected. The total time spent in the appointment was 40 minutes.     [name], MD 04/24/2023

## 2023-04-24 NOTE — Telephone Encounter (Signed)
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  CBC w/ Diff and CMP from 04/24/23 assessed, noted patient with pltc of 134 K/uL, WBC 3.4 K/uL (ANC 2000) - no baseline dose adjustments required at this time. Prescription dose and frequency assessed for appropriateness.  Current medication list in Epic reviewed, no relevant/significant DDIs with Lonsurf identified.  Evaluated chart and no patient barriers to medication adherence noted.   Patient agreement for treatment documented in MD note on 04/24/23.  Prescription has been e-scribed to [facility] due to patient's insurance requirements.   Oral Oncology Clinic will continue to follow for insurance authorization, copayment issues, initial counseling and start date.  Lenord Carbo, PharmD, BCPS, [facility] Hematology/Oncology Clinical Pharmacist [facility] Oral Chemotherapy Navigation Clinics [phone] 04/24/2023 9:50 AM

## 2023-04-24 NOTE — Telephone Encounter (Signed)
Oral Oncology Patient Advocate Encounter   Received notification that prior authorization for Lonsurf is required.   PA submitted on 04/24/23 Key [id number] Status is pending     Jinger Neighbors, CPhT-Adv Oncology Pharmacy Patient Advocate [facility] Direct Number: [phone]  Fax: [phone]

## 2023-04-25 ENCOUNTER — Other Ambulatory Visit: Payer: Self-pay | Admitting: Hematology

## 2023-04-26 ENCOUNTER — Inpatient Hospital Stay: Payer: Federal, State, Local not specified - PPO

## 2023-04-28 ENCOUNTER — Telehealth: Payer: Self-pay | Admitting: Hematology

## 2023-04-28 ENCOUNTER — Other Ambulatory Visit: Payer: Self-pay

## 2023-04-29 ENCOUNTER — Other Ambulatory Visit: Payer: Self-pay

## 2023-04-29 DIAGNOSIS — R112 Nausea with vomiting, unspecified: Secondary | ICD-10-CM

## 2023-04-29 MED ORDER — ONDANSETRON HCL 8 MG PO TABS: 8.0000 mg | ORAL_TABLET | Freq: Three times a day (TID) | ORAL | 2 refills | Status: DC | PRN

## 2023-04-29 MED ORDER — PROCHLORPERAZINE MALEATE 10 MG PO TABS: 10.0000 mg | ORAL_TABLET | Freq: Four times a day (QID) | ORAL | 2 refills | Status: DC | PRN

## 2023-04-29 NOTE — Telephone Encounter (Signed)
Oral Chemotherapy Pharmacist Encounter   Attempted to reach patient to provide update and offer for initial counseling on oral medication: Lonsurf (trifluridine and tipiracil).   No answer. Left voicemail for patient to call back to discuss details of medication acquisition and initial counseling session.  Lenord Carbo, PharmD, BCPS, [facility] Hematology/Oncology Clinical Pharmacist [facility] Oral Chemotherapy Navigation Clinics [phone] 04/29/2023 9:53 AM

## 2023-04-29 NOTE — Telephone Encounter (Signed)
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    Counseled patient on administration, dosing, side effects, monitoring, drug-food interactions, safe handling, storage, and disposal.   Patient will take Lonsurf 20mg  (trifluridine component) tablets, 2 tablets (40mg  trifluridine) by mouth in AM and 3 tablets (60mg  trifluridine) by mouth in PM, within 1 hour of finishing AM & PM meals, on days 1-5 and days 8-12, every 28 days.  Patient plans to take Lonsurf M-F, Saturday and Sunday off days, Lonsurf M-F again, then no tablets for the next 2 weeks. Patient will start D1 of each cycle on Mondays.  Patient is aware she should have 10 leftover tablets in her bottle of Lonsurf at the end of her first cycle, as she is being dispensed a qty of #60 due to the pharmacy not being able to break the Lonsurf bottle for dispensing. Patient expressed understanding.  Lonsurf start date: 05/05/23   Adverse effects include but are not limited to: fatigue, nausea, vomiting, diarrhea, and decreased blood counts.   Nausea/vomiting: will reach out to MD to send in updated anti-nausea meds for patient as hers are expired. Informed patient that if she experiences nausea she can take anti-emetic 30-60 minutes prior to AM and PM doses of Lonsurf to help decrease nausea. Patient expressed understanding.  Diarrhea: Patient will obtain Imodium (loperamide) to have on hand if they experience diarrhea. Patient knows to alert the office of 4 or more loose stools above baseline.  Reviewed with patient importance of keeping a medication schedule and plan for any missed doses. No barriers to medication adherence identified.   Medication reconciliation performed and medication/allergy list updated.  Patient's insurance  requires her to fill through [facility]. Patient has set up shipment of her medication and it will arrive to her 05/02/23.  All questions answered.  Ms. Mcgauley voiced understanding and appreciation.    Medication education handout and medication calendar placed in mail for patient. Patient knows to call the office with questions or concerns. Oral Chemotherapy Clinic phone number provided to patient.    Lenord Carbo, PharmD, BCPS, BCOP Hematology/Oncology Clinical Pharmacist [facility] and [facility] Oral Chemotherapy Navigation Clinics [phone] 04/29/2023 2:05 PM

## 2023-05-01 ENCOUNTER — Other Ambulatory Visit: Payer: Federal, State, Local not specified - PPO

## 2023-05-01 ENCOUNTER — Ambulatory Visit: Payer: Federal, State, Local not specified - PPO

## 2023-05-01 ENCOUNTER — Ambulatory Visit: Payer: Federal, State, Local not specified - PPO | Admitting: Hematology

## 2023-05-01 NOTE — Progress Notes (Signed)

## 2023-05-03 ENCOUNTER — Inpatient Hospital Stay: Payer: Federal, State, Local not specified - PPO | Attending: Nurse Practitioner

## 2023-05-05 ENCOUNTER — Telehealth: Payer: Self-pay | Admitting: Pharmacist

## 2023-05-05 ENCOUNTER — Telehealth: Payer: Self-pay

## 2023-05-05 NOTE — Telephone Encounter (Signed)
Patient called to go over a new medication that she is starting.The medication is Lonsurf. Per order made patient aware that you take 2 tablets  in the morning and 3 tablets in the evening within 1 hour after AM & PM meals. PT stated that she understood but wanted more clarification from the pharmacist Lenord Carbo that she spoke with previously. Sent a chat to Lenord Carbo M RPH and Alan Ripper Medlin C PHT to please call this patient about medication clarification.

## 2023-05-05 NOTE — Telephone Encounter (Signed)
Oral Chemotherapy Pharmacist Encounter   Returned patient's phone call regarding questions on Lonsurf.   Reviewed again with patient that she will take 2 tablets of Lonsurf in AM and 3 tablets of Lonsurf in PM and that she will take it after her AM and PM meals. We discussed that the AM and PM doses should be separated ~10-12 hours.   Patient will take first dose this AM (05/05/23) and knows to call the office with any further questions or concerns.   Lenord Carbo, PharmD, BCPS, [facility] Hematology/Oncology Clinical Pharmacist [facility] Oral Chemotherapy Navigation Clinics [phone] 05/05/2023 8:31 AM

## 2023-05-07 NOTE — Assessment & Plan Note (Signed)
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  -She began first-line chemo with dose-reduced FOLFIRI and Beva in 2/22.  -we changed to FOLFOX with continued Beva on 03/28/22. She tolerated well overall with some fatigue and cold sensitivity. -I personally reviewed her restaging CT from 10/07/2022 which showed overall SD in liver and rectum, stable and indermininate lung nodules, no other new lesions  -She is tolerating chemotherapy overall well, will continue. -I personally reviewed her restaging CT scan from October 07, 2022, which showed overall stable disease.  She has multiple small lung nodules, indeterminate, including one 7 mm nodule, will continue monitoring. 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 -Restaging CT scan on April 14, 2023 unfortunately showed disease progression in liver. -I discussed the third line chemotherapy options, I recommend Lonsurf and bevacizumab -will request NGS [facility] on her rectal tumor biopsy in 2021

## 2023-05-08 ENCOUNTER — Inpatient Hospital Stay: Payer: Federal, State, Local not specified - PPO | Admitting: Hematology

## 2023-05-08 ENCOUNTER — Other Ambulatory Visit: Payer: Federal, State, Local not specified - PPO

## 2023-05-08 ENCOUNTER — Inpatient Hospital Stay: Payer: Federal, State, Local not specified - PPO

## 2023-05-08 ENCOUNTER — Ambulatory Visit: Payer: Federal, State, Local not specified - PPO | Admitting: Hematology

## 2023-05-08 ENCOUNTER — Inpatient Hospital Stay: Payer: Federal, State, Local not specified - PPO | Attending: Nurse Practitioner

## 2023-05-08 ENCOUNTER — Ambulatory Visit: Payer: Federal, State, Local not specified - PPO

## 2023-05-08 VITALS — BP 128/81 | HR 98 | Temp 98.2°F | Resp 18

## 2023-05-08 VITALS — BP 143/88 | HR 94 | Temp 98.2°F | Resp 16 | Wt 104.1 lb

## 2023-05-08 DIAGNOSIS — Z923 Personal history of irradiation: Secondary | ICD-10-CM | POA: Insufficient documentation

## 2023-05-08 DIAGNOSIS — Z1509 Genetic susceptibility to other malignant neoplasm: Secondary | ICD-10-CM | POA: Diagnosis not present

## 2023-05-08 DIAGNOSIS — T451X5D Adverse effect of antineoplastic and immunosuppressive drugs, subsequent encounter: Secondary | ICD-10-CM | POA: Diagnosis not present

## 2023-05-08 DIAGNOSIS — Z5112 Encounter for antineoplastic immunotherapy: Secondary | ICD-10-CM | POA: Insufficient documentation

## 2023-05-08 DIAGNOSIS — R911 Solitary pulmonary nodule: Secondary | ICD-10-CM | POA: Diagnosis not present

## 2023-05-08 DIAGNOSIS — C787 Secondary malignant neoplasm of liver and intrahepatic bile duct: Secondary | ICD-10-CM | POA: Insufficient documentation

## 2023-05-08 DIAGNOSIS — C20 Malignant neoplasm of rectum: Secondary | ICD-10-CM | POA: Insufficient documentation

## 2023-05-08 DIAGNOSIS — Z9049 Acquired absence of other specified parts of digestive tract: Secondary | ICD-10-CM | POA: Insufficient documentation

## 2023-05-08 DIAGNOSIS — R110 Nausea: Secondary | ICD-10-CM | POA: Diagnosis not present

## 2023-05-08 DIAGNOSIS — G620 Drug-induced polyneuropathy: Secondary | ICD-10-CM | POA: Diagnosis not present

## 2023-05-08 LAB — CBC WITH DIFFERENTIAL (CANCER CENTER ONLY)
Abs Immature Granulocytes: 0.01 10*3/uL (ref 0.00–0.07)
Basophils Absolute: 0 10*3/uL (ref 0.0–0.1)
Basophils Relative: 0 %
Eosinophils Absolute: 0 10*3/uL (ref 0.0–0.5)
Eosinophils Relative: 1 %
HCT: 35.2 % — ABNORMAL LOW (ref 36.0–46.0)
Hemoglobin: 10.9 g/dL — ABNORMAL LOW (ref 12.0–15.0)
Immature Granulocytes: 0 %
Lymphocytes Relative: 20 %
Lymphs Abs: 1.1 10*3/uL (ref 0.7–4.0)
MCH: 28.2 pg (ref 26.0–34.0)
MCHC: 31 g/dL (ref 30.0–36.0)
MCV: 91 fL (ref 80.0–100.0)
Monocytes Absolute: 0.4 10*3/uL (ref 0.1–1.0)
Monocytes Relative: 8 %
Neutro Abs: 3.7 10*3/uL (ref 1.7–7.7)
Neutrophils Relative %: 71 %
Platelet Count: 140 10*3/uL — ABNORMAL LOW (ref 150–400)
RBC: 3.87 MIL/uL (ref 3.87–5.11)
RDW: 14 % (ref 11.5–15.5)
WBC Count: 5.2 10*3/uL (ref 4.0–10.5)
nRBC: 0 % (ref 0.0–0.2)

## 2023-05-08 LAB — TOTAL PROTEIN, URINE DIPSTICK: Protein, ur: NEGATIVE mg/dL

## 2023-05-08 MED ORDER — SODIUM CHLORIDE 0.9 % IV SOLN
INTRAVENOUS | Status: DC
Start: 2023-05-08 — End: 2023-05-08

## 2023-05-08 MED ORDER — HEPARIN SOD (PORK) LOCK FLUSH 100 UNIT/ML IV SOLN
500.0000 [IU] | Freq: Once | INTRAVENOUS | Status: AC | PRN
  Administered 2023-05-08: 500 [IU]

## 2023-05-08 MED ORDER — SODIUM CHLORIDE 0.9 % IV SOLN
5.0000 mg/kg | Freq: Once | INTRAVENOUS | Status: AC
  Administered 2023-05-08: 250 mg via INTRAVENOUS
  Filled 2023-05-08: qty 10

## 2023-05-08 MED ORDER — SODIUM CHLORIDE 0.9% FLUSH
10.0000 mL | INTRAVENOUS | Status: DC | PRN
Start: 2023-05-08 — End: 2023-05-08
  Administered 2023-05-08: 10 mL

## 2023-05-08 NOTE — Patient Instructions (Signed)
[facility] - A DEPT OF [facility]  Discharge Instructions: Thank you for choosing [facility] to provide your oncology and hematology care.   If you have a lab appointment with the Cancer Center, please go directly to the Cancer Center and check in at the registration area.   Wear comfortable clothing and clothing appropriate for easy access to any Portacath or PICC line.   We strive to give you quality time with your provider. You may need to reschedule your appointment if you arrive late (15 or more minutes).  Arriving late affects you and other patients whose appointments are after yours.  Also, if you miss three or more appointments without notifying the office, you may be dismissed from the clinic at the provider's discretion.      For prescription refill requests, have your pharmacy contact our office and allow 72 hours for refills to be completed.    Today you received the following chemotherapy and/or immunotherapy agents: MVASI     To help prevent nausea and vomiting after your treatment, we encourage you to take your nausea medication as directed.  BELOW ARE SYMPTOMS THAT SHOULD BE REPORTED IMMEDIATELY: *FEVER GREATER THAN 100.4 F (38 C) OR HIGHER *CHILLS OR SWEATING *NAUSEA AND VOMITING THAT IS NOT CONTROLLED WITH YOUR NAUSEA MEDICATION *UNUSUAL SHORTNESS OF BREATH *UNUSUAL BRUISING OR BLEEDING *URINARY PROBLEMS (pain or burning when urinating, or frequent urination) *BOWEL PROBLEMS (unusual diarrhea, constipation, pain near the anus) TENDERNESS IN MOUTH AND THROAT WITH OR WITHOUT PRESENCE OF ULCERS (sore throat, sores in mouth, or a toothache) UNUSUAL RASH, SWELLING OR PAIN  UNUSUAL VAGINAL DISCHARGE OR ITCHING   Items with * indicate a potential emergency and should be followed up as soon as possible or go to the Emergency Department if any problems should occur.  Please show the CHEMOTHERAPY ALERT CARD or IMMUNOTHERAPY  ALERT CARD at check-in to the Emergency Department and triage nurse.  Should you have questions after your visit or need to cancel or reschedule your appointment, please contact [facility] - A DEPT OF [facility]  Dept: [phone]  and follow the prompts.  Office hours are 8:00 a.m. to 4:30 p.m. Monday - Friday. Please note that voicemails left after 4:00 p.m. may not be returned until the following business day.  We are closed weekends and major holidays. You have access to a nurse at all times for urgent questions. Please call the main number to the clinic Dept: [phone] and follow the prompts.   For any non-urgent questions, you may also contact your provider using MyChart. We now offer e-Visits for anyone 43 and older to request care online for non-urgent symptoms. For details visit [email or web address].   Also download the MyChart app! Go to the app store, search "MyChart", open the app, select [place], and log in with your MyChart username and password.

## 2023-05-08 NOTE — Progress Notes (Signed)
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  -She began first-line chemo with dose-reduced FOLFIRI and Beva in 2/22.  -we changed to FOLFOX with continued Beva on 03/28/22. She tolerated well overall with some fatigue and cold sensitivity. -I personally reviewed her restaging CT from 10/07/2022 which showed overall SD in liver and rectum, stable and indermininate lung nodules, no other new lesions  -She is tolerating chemotherapy overall well, will continue. -I personally reviewed her restaging CT scan from October 07, 2022, which showed overall stable disease.  She has multiple small lung nodules, indeterminate, including one 7 mm nodule, will continue monitoring. 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 -Restaging CT scan on April 14, 2023 unfortunately showed disease progression in liver. -I discussed the third line chemotherapy options, I recommend Lonsurf and bevacizumab -will request NGS [facility] on her rectal tumor biopsy in 2021    Assessment and Plan    Metastatic Rectal Cancer Patient is on a  new regimen of oral chemotherapy Lonsurf and Bevacizumab infusions. She reported mild nausea. She has not experienced any significant loss of appetite. -Continue current regimen of oral chemotherapy and Bevacizumab infusions. -Encourage patient to use a calendar to track medication schedule. -Advise patient to avoid spicy and acidic foods which may exacerbate nausea. -Plan to monitor tumor markers and repeat scan in three months (January 2025). -Schedule next infusion for May 22, 2023. -Adjust future appointments to Mondays starting January 2020, per patient's preference. -Print out a schedule for the patient since she does not use MyChart.      Plan -Continue cycle 1 Lonsurf, will proceed bevacizumab today -Follow-up in 2 weeks   SUMMARY OF ONCOLOGIC HISTORY: Oncology History Overview Note  Cancer Staging Cancer of ascending colon [facility]) Staging form: Colon and Rectum, AJCC 7th Edition - Clinical stage from 02/07/2016: Stage IIIC (T4b, N1b, M0) - Signed by [name], MD on 03/04/2016 Laterality: Right Residual tumor (R): R2 - Macroscopic    Cancer of ascending colon (HCC)  10/25/2015 Imaging   CT ABD/PELVIS:  Inflammatory changes inferior to the cecal tip appear improved, there is still irregular soft tissue thickening of the cecal tip, and there are adjacent prominent lymph nodes in the ileocolonic mesentery, measuring 13 mm on image 49 and 8 mm on image 52. In addition, there is a 2.5 x 1.8 cm nodule on image 46 which has central low density. Therefore, these findings are moderately suspicious for an underlying cecal malignancy with perforation.    01/19/2016 Procedure   COLONOSCOPY per Dr. [name]: Fungating, ulcerated mass almost obstructing mid ascending colon   01/19/2016 Initial Biopsy   Diagnosis Surgical [P], cecal mass -  INVASIVE ADENOCARCINOMA WITH ULCERATION. - SEE COMMENT.   02/05/2016 Tumor Marker   Patient's tumor was tested for the following markers:  CEA Results of the tumor marker test revealed 5.7.   02/07/2016 Initial Diagnosis   Cancer of ascending colon (HCC)   02/07/2016 Definitive Surgery   Laparoscopic assisted right hemicolectomy and right salpingo oopherectomy--[name]   02/07/2016 Pathologic Stage   p T4 N1b   2/43 nodes +   02/07/2016 Pathology Results   MMR normal; G2 adenocarcinoma;proximal & distal margins negative; soft tissue mass on pelvic sidewall + for adenocarcinoma with positive margin MSI Stable   03/08/2016 Imaging   CT chest negative for metastasis.    03/19/2016 - 04/25/2016 Radiation Therapy   Adjuvant irradiation, 50 gray in 28 fractions   03/19/2016 - 04/22/2016 Chemotherapy   Xeloda 1500 mg twice daily, started on 03/19/2016, dose reduced to 1000 mg twice daily from week 3 due to neutropenia, and patient stopped 3 days before last dose radiation due to difficulty swallowing the pill    05/20/2016 -  Adjuvant Chemotherapy   Patient declined adjuvant chemotherapy   09/16/2016 Imaging   CT CAP w Contrast 1. No evidence of local tumor recurrence at the ileocolic anastomosis. 2. No findings suspicious for metastatic disease in the chest, abdomen or pelvis. 3. Nonspecific trace free fluid in the pelvic cul-de-sac. 4. Stable solitary 3 mm right upper lobe pulmonary nodule, for which 6 month stability has been demonstrated, probably benign. 5. Additional findings include stable right posterior pericardial cyst and small calcified uterine fibroids.   05/13/2017 Imaging   CT CAP W Contrast 05/13/17 IMPRESSION: 1. No current findings of residual or recurrent malignancy. 2. Mild prominence of stool throughout the colon. Nondistended portions of the rectum. 3. Several tiny pulmonary nodules are stable from the earliest available comparison of 03/08/2016 and probably benign, but may merit surveillance. 4. Other imaging findings of potential clinical significance: Old granulomatous disease. Aortoiliac  atherosclerotic vascular disease. Lumbar spondylosis and degenerative disc disease. Stable amount of trace free pelvic fluid.   04/27/2018 Imaging   04/27/2018 CT CAP IMPRESSION: Stable exam. No evidence of recurrent or metastatic carcinoma within the chest, abdomen, or pelvis   04/19/2019 Imaging   CT CAP W Contrast  IMPRESSION: Chest Impression:   1. No evidence of thoracic metastasis. 2. Stable small bilateral pulmonary nodules.   Abdomen / Pelvis Impression:   1. No evidence local colorectal carcinoma recurrence or metastasis in the abdomen pelvis. 2. Post RIGHT hemicolectomy.   01/22/2021 Imaging   CT CAP  IMPRESSION: CT CHEST IMPRESSION   1. Similar nonspecific pulmonary nodules. 2. New posterior left upper lobe reticulonodular opacity, suspicious for interval mild infection or inflammation. 3. No thoracic adenopathy.   CT ABDOMEN AND PELVIS IMPRESSION   1. Further decrease in size of high left hepatic lobe 3 mm low-density lesion. No new or progressive metastatic disease within the abdomen or pelvis. 2. Similar trace free pelvic fluid. 3. Similar nonspecific mid rectal wall thickening. 4.  Aortic Atherosclerosis (ICD10-I70.0).   04/23/2021 Imaging   CT CAP  IMPRESSION: 1. Treated metastatic lesion between segments 2 and 3 of the liver, slightly smaller and less distinct than prior examination. No other signs of definite metastatic disease elsewhere in the abdomen or pelvis. 2. Multiple small pulmonary nodules, stable compared to the prior examination, favored to be benign. No definitive findings to suggest metastatic disease to the thorax. 3. Aortic atherosclerosis. 4. Additional incidental findings, as above.   08/13/2021  Imaging   EXAM: CT CHEST, ABDOMEN, AND PELVIS WITH CONTRAST  IMPRESSION: 1. A previously seen PET avid lesion of the anterior left lobe of the liver, hepatic segment II, is no longer discretely appreciable consistent with  treatment response of a hepatic metastasis. 2. No evidence of new metastatic disease in the chest, abdomen, or pelvis. 3. Interval increase in a small focus of consolidation and nodularity of the medial left upper lobe, consistent with minimal, ongoing atypical infection. Additional tiny bilateral pulmonary nodules are stable and almost certainly incidental benign. Attention on follow-up. 4. Status post right hemicolectomy and ileocolic anastomosis.   07/02/2022 Imaging    IMPRESSION: CHEST IMPRESSION:   1. No evidence of thoracic metastasis. 2. Stable small pulmonary nodules.   PELVIS IMPRESSION:   1. Stable to slight decrease in size of subcapsular lesion in the RIGHT hepatic lobe. 2. No evidence of new or progressive disease in the abdomen pelvis. 3.  Aortic Atherosclerosis (ICD10-I70.0).     12/31/2022 Imaging    IMPRESSION: 1. Right hepatic lobe metastasis has undergone mild-to-moderate enlargement since 10/07/2022. A left hepatic lobe 5 mm lesion is not readily apparent on the prior, suspicious for a new metastasis. 2. Similar nonspecific tiny pulmonary nodules. 3. Similar amorphous soft tissue thickening within the posterior superior right hemipelvis, hypermetabolic on prior PET and indeterminate for residual disease versus scarring. 4. Similar equivocal soft tissue fullness within the rectum. 5. Increased size of an abdominopelvic ventral wall nodule for which subcutaneous metastasis are a concern. 6. New trace right pelvic fluid. 7. Incidental findings, including: Aortic Atherosclerosis (ICD10-I70.0). 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 - Patent side-to-side ileo-colonic anastomosis,  characterized by healthy appearing mucosa. - The examined portion of the ileum was normal. - One diminutive polyp in the proximal transverse colon, removed with a cold biopsy forceps. Resected and retrieved. - Likely malignant partially obstructing tumor in the mid rectum. Biopsied. Tattooed. - The examination was otherwise normal on direct and retroflexion views.   06/15/2020 Initial Biopsy   Diagnosis 1. Transverse Colon Polyp - HYPERPLASTIC POLYP 2. Rectum, biopsy - ADENOCARCINOMA ARISING IN A TUBULAR ADENOMA WITH HIGH-GRADE DYSPLASIA. SEE NOTE Diagnosis Note 2. Dr. Luisa Hart reviewed the case and concurs with the diagnosis. Dr. [name] was notified on 06/16/2020.   06/28/2020 Imaging   CT CAP  IMPRESSION: 1. New low-density focus in the anterior aspect of the lateral segment LEFT hepatic lobe measuring 1.2 x 1.0 cm, compatible with small metastatic lesion in the LEFT hepatic lobe. 2. Soft tissue in the RIGHT iliac fossa following RIGHT hemicolectomy invades the psoas musculature and is slowly enlarging over time, more linear on the prior study now highly concerning for recurrence/metastasis to this location. 3. Signs of enteritis, potentially post radiation changes of the small bowel. Tethered small bowel in the RIGHT lower quadrant shows focal thickening and narrowing suspicious for small bowel involvement and developing partial obstruction though currently contrast passes beyond this point into the colon. 4. Rectal thickening in this patient with known rectal mass as described. 5. No evidence of metastatic disease in the chest. 6. Stable small pulmonary nodules. 7.  and aortic atherosclerosis.   Aortic Atherosclerosis (ICD10-I70.0) and Emphysema (ICD10-J43.9).   07/04/2020 Initial Diagnosis   Rectal cancer metastasized to liver (  HCC)   07/12/2020 PET scan   IMPRESSION: 1. Exam positive for FDG avid rectal tumor which corresponds to the recent colonoscopy findings. 2. FDG  avid soft tissue mass within the right iliac fossa is noted and consistent with local tumor recurrence from previous ascending colon tumor. 3. Lateral segment left lobe of liver lesion is FDG avid concerning for liver metastasis. 4. No specific findings identified to suggest metastatic disease to the chest.   08/10/2020 -  Chemotherapy   First-line FOLFIRI q2weeks starting 08/10/20. dose reduced with cycle 1. Irinotecan/5FU increased and Bevacizumab added with cycle 2 on 08/23/2020    08/17/2020 - 03/09/2022 Chemotherapy   Patient is on Treatment Plan : COLORECTAL FOLFIRI + Bevacizumab q14d     10/27/2020 Imaging   CT CAP  IMPRESSION: 1. Interval decrease in size of the hypermetabolic left hepatic lesion, consistent with metastatic disease. No new liver lesion evident. 2. Interval resolution of the hypermetabolic soft tissue lesion along the right iliac fossa with no measurable soft tissue lesion remaining at this location today. 3. Similar appearance of soft tissue fullness in the rectum at the site of the hypermetabolic lesion seen previously. 4. Stable tiny bilateral pulmonary nodules. Continued attention on follow-up recommended. 5. Small volume free fluid in the pelvis. 6. Aortic Atherosclerosis (ICD10-I70.0).   01/22/2021 Imaging   CT CAP  IMPRESSION: CT CHEST IMPRESSION   1. Similar nonspecific pulmonary nodules. 2. New posterior left upper lobe reticulonodular opacity, suspicious for interval mild infection or inflammation. 3. No thoracic adenopathy.   CT ABDOMEN AND PELVIS IMPRESSION   1. Further decrease in size of high left hepatic lobe 3 mm low-density lesion. No new or progressive metastatic disease within the abdomen or pelvis. 2. Similar trace free pelvic fluid. 3. Similar nonspecific mid rectal wall thickening. 4.  Aortic Atherosclerosis (ICD10-I70.0).   04/23/2021 Imaging   CT CAP  IMPRESSION: 1. Treated metastatic lesion between segments 2 and 3 of the  liver, slightly smaller and less distinct than prior examination. No other signs of definite metastatic disease elsewhere in the abdomen or pelvis. 2. Multiple small pulmonary nodules, stable compared to the prior examination, favored to be benign. No definitive findings to suggest metastatic disease to the thorax. 3. Aortic atherosclerosis. 4. Additional incidental findings, as above.   08/13/2021 Imaging   EXAM: CT CHEST, ABDOMEN, AND PELVIS WITH CONTRAST  IMPRESSION: 1. A previously seen PET avid lesion of the anterior left lobe of the liver, hepatic segment II, is no longer discretely appreciable consistent with treatment response of a hepatic metastasis. 2. No evidence of new metastatic disease in the chest, abdomen, or pelvis. 3. Interval increase in a small focus of consolidation and nodularity of the medial left upper lobe, consistent with minimal, ongoing atypical infection. Additional tiny bilateral pulmonary nodules are stable and almost certainly incidental benign. Attention on follow-up. 4. Status post right hemicolectomy and ileocolic anastomosis.   03/28/2022 - 04/05/2023 Chemotherapy   Patient is on Treatment Plan : COLORECTAL FOLFOX + Bevacizumab q14d     07/02/2022 Imaging    IMPRESSION: CHEST IMPRESSION:   1. No evidence of thoracic metastasis. 2. Stable small pulmonary nodules.   PELVIS IMPRESSION:   1. Stable to slight decrease in size of subcapsular lesion in the RIGHT hepatic lobe. 2. No evidence of new or progressive disease in the abdomen pelvis.   10/07/2022 Imaging    IMPRESSION: 1. Stable hypovascular mass in the periphery of the right lobe of the liver, which likely represents  a treated metastatic lesion. Stable subcentimeter lesion just medial to this is also similar to the recent prior examination. No new hepatic lesions are otherwise noted. 2. Persistent but stable poorly defined soft tissue thickening in the right lower quadrant associated  with multiple small bowel loops and the overlying iliopsoas musculature, which corresponds to focal hypermetabolism on remote prior PET-CT. Given the stability, this likely represents a treated metastatic lesion. Continued attention on follow-up studies is recommended. 3. Persistent mass-like thickening in the proximal rectum corresponding to previously noted hypermetabolic rectal neoplasm on prior PET-CT. This currently measures approximately 2.8 x 2.3 cm and is slightly more apparent than the most recent prior study. 4. Multiple small pulmonary nodules generally stable compared to the prior study, with exception of a new branching nodule in the anterior aspect of the left upper lobe measuring 7 x 3 mm (mean diameter 5 mm). This is nonspecific. Close attention on follow-up studies is recommended to ensure stability. 5. Aortic atherosclerosis. 6. Additional incidental findings, as above.     12/31/2022 Imaging    IMPRESSION: 1. Right hepatic lobe metastasis has undergone mild-to-moderate enlargement since 10/07/2022. A left hepatic lobe 5 mm lesion is not readily apparent on the prior, suspicious for a new metastasis. 2. Similar nonspecific tiny pulmonary nodules. 3. Similar amorphous soft tissue thickening within the posterior superior right hemipelvis, hypermetabolic on prior PET and indeterminate for residual disease versus scarring. 4. Similar equivocal soft tissue fullness within the rectum. 5. Increased size of an abdominopelvic ventral wall nodule for which subcutaneous metastasis are a concern. 6. New trace right pelvic fluid. 7. Incidental findings, including: Aortic Atherosclerosis (ICD10-I70.0). Possible constipation   05/08/2023 -  Chemotherapy   Patient is on Treatment Plan : COLORECTAL Bevacizumab + Trifluridine/Tipiracil q28d        Discussed the use of AI scribe software for clinical note transcription with the patient, who gave verbal consent to  proceed.  History of Present Illness   A 72 year old patient with a history of metastatic rectal cancer presents for a follow-up visit. The patient takes two pills in the morning and three in the evening, with a break on the weekends.  She also mentions a slight weight loss, but does not report any loss of appetite. The patient has been managing her medication schedule and has been proactive in adjusting her personal schedule to accommodate her treatment. She has not reported any other significant side effects or concerns.         All other systems were reviewed with the patient and are negative.  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 Some residual intraretinal fluid remains, very small localized subfoveal of fluid remains although this continues to slowly resorb. We'll continue to observe.   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  ALLERGIES:  is allergic to venofer [iron sucrose], fish allergy, peanut-containing drug products, soy allergy, and buspirone.  MEDICATIONS:  Current Outpatient Medications  Medication Sig Dispense Refill   ALPRAZolam (XANAX) 0.25 MG tablet Take 1 tablet (0.25 mg total) by mouth daily as needed for anxiety. 30 tablet 0   Cholecalciferol (VITAMIN D3 PO) Take by mouth daily.     docusate sodium (COLACE) 100 MG capsule 1 capsule as needed     gabapentin (NEURONTIN) 100 MG capsule Take 1 capsule (100 mg total) by mouth at bedtime. 30 capsule 1   lidocaine-prilocaine (EMLA) cream Apply 1 Application topically as needed. 30 g 1   LONSURF 20-8.19 MG tablet TAKE 2 TABLETS IN MORNING AND 3 TABS IN EVENING. TAKE WITHIN 1 HR AFTEr AM & PM meals on days 1-5, 8-12. Repeat every 28 days. 60 tablet 1   NORVASC 2.5 MG tablet TAKE 1 TABLET BY MOUTH EVERY DAY 90 tablet 1   ondansetron (ZOFRAN) 8 MG tablet Take 1 tablet (8 mg total) by mouth every 8 (eight) hours as needed for nausea or vomiting. 20 tablet 2   prochlorperazine (COMPAZINE) 10 MG tablet Take 1 tablet (10 mg total) by mouth every 6 (six) hours as needed for nausea or vomiting. 30 tablet 2   Current Facility-Administered Medications  Medication Dose Route Frequency Provider Last Rate Last Admin   0.9 %  sodium chloride infusion  500 mL Intravenous Once Sherrilyn Rist, MD       Facility-Administered Medications Ordered in Other Visits  Medication Dose Route Frequency Provider Last Rate Last Admin   0.9 %  sodium chloride infusion   Intravenous Continuous [name], MD 10 mL/hr at 05/08/23 0932 New Bag at 05/08/23 0932   sodium chloride flush (NS) 0.9 % injection 10 mL  10 mL Intracatheter PRN [name], MD   10 mL at 05/08/23 1029    PHYSICAL EXAMINATION: ECOG PERFORMANCE STATUS: 1 - Symptomatic but completely  ambulatory  Vitals:   05/08/23 0825  BP: (!) 143/88  Pulse: 94  Resp: 16  Temp: 98.2 F (36.8 C)  SpO2: 100%   Wt Readings from Last 3 Encounters:  05/08/23 104 lb 1.6 oz (47.2 kg)  04/24/23 108 lb (49 kg)  04/03/23 106 lb 4 oz (48.2 kg)     GENERAL:alert, no distress and comfortable SKIN: skin color, texture, turgor are normal, no rashes or significant lesions EYES: normal, Conjunctiva are pink and non-injected, sclera clear NECK: supple, thyroid normal size, non-tender, without nodularity LYMPH:  no palpable lymphadenopathy in the cervical, axillary  LUNGS: clear to auscultation and percussion with normal breathing effort HEART: regular rate & rhythm and no murmurs and no lower extremity edema ABDOMEN:abdomen soft, non-tender and normal bowel sounds Musculoskeletal:no cyanosis of digits and no clubbing  NEURO: alert & oriented x 3 with fluent speech, no focal motor/sensory deficits   LABORATORY DATA:  I have reviewed the  data as listed    Latest Ref Rng & Units 05/08/2023    8:04 AM 04/24/2023    8:15 AM 04/03/2023    8:00 AM  CBC  WBC 4.0 - 10.5 K/uL 5.2  3.4  3.4   Hemoglobin 12.0 - 15.0 g/dL 41.3  24.4  01.0   Hematocrit 36.0 - 46.0 % 35.2  32.5  32.5   Platelets 150 - 400 K/uL 140  134  145         Latest Ref Rng & Units 04/24/2023    8:15 AM 04/03/2023    8:00 AM 03/13/2023    9:15 AM  CMP  Glucose 70 - 99 mg/dL 87  87  98   BUN 8 - 23 mg/dL 12  12  9    Creatinine 0.44 - 1.00 mg/dL 2.72  5.36  6.44   Sodium 135 - 145 mmol/L 138  137  136   Potassium 3.5 - 5.1 mmol/L 4.1  4.1  4.0   Chloride 98 - 111 mmol/L 105  105  103   CO2 22 - 32 mmol/L 28  27  29    Calcium 8.9 - 10.3 mg/dL 9.0  8.9  8.7   Total Protein 6.5 - 8.1 g/dL 7.2  7.2  6.8   Total Bilirubin 0.3 - 1.2 mg/dL 0.4  0.3  0.4   Alkaline Phos 38 - 126 U/L 109  102  95   AST 15 - 41 U/L 21  23  17    ALT 0 - 44 U/L 10  10  7        RADIOGRAPHIC STUDIES: I have personally reviewed the  radiological images as listed and agreed with the findings in the report. No results found.    Orders Placed This Encounter  Procedures   Comprehensive metabolic panel    Standing Status:   Standing    Number of Occurrences:   50    Standing Expiration Date:   05/07/2024   CBC with Differential (Cancer Center Only)    Standing Status:   Future    Standing Expiration Date:   06/30/2024   Total Protein, Urine dipstick    Standing Status:   Future    Standing Expiration Date:   06/30/2024   CBC with Differential (Cancer Center Only)    Standing Status:   Future    Standing Expiration Date:   07/13/2024   All questions were answered. The patient knows to call the clinic with any problems, questions or concerns. No barriers to learning was detected. The total time spent in the appointment was 25 minutes.     [name], MD 05/08/2023

## 2023-05-10 ENCOUNTER — Inpatient Hospital Stay: Payer: Federal, State, Local not specified - PPO

## 2023-05-15 ENCOUNTER — Ambulatory Visit: Payer: Federal, State, Local not specified - PPO

## 2023-05-15 ENCOUNTER — Ambulatory Visit: Payer: Federal, State, Local not specified - PPO | Admitting: Hematology

## 2023-05-15 ENCOUNTER — Other Ambulatory Visit: Payer: Federal, State, Local not specified - PPO

## 2023-05-21 ENCOUNTER — Encounter: Payer: Self-pay | Admitting: Hematology

## 2023-05-21 NOTE — Telephone Encounter (Signed)
Telephone call  

## 2023-05-21 NOTE — Assessment & Plan Note (Signed)
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  -She began first-line chemo with dose-reduced FOLFIRI and Beva in 2/22.  -we changed to FOLFOX with continued Beva on 03/28/22. She tolerated well overall with some fatigue and cold sensitivity. -I personally reviewed her restaging CT from 10/07/2022 which showed overall SD in liver and rectum, stable and indermininate lung nodules, no other new lesions  -She is tolerating chemotherapy overall well, will continue. -I personally reviewed her restaging CT scan from October 07, 2022, which showed overall stable disease.  She has multiple small lung nodules, indeterminate, including one 7 mm nodule, will continue monitoring. 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 -Restaging CT scan on April 14, 2023 unfortunately showed disease progression in liver. -I discussed the third line chemotherapy options, I recommend Lonsurf and bevacizumab -will request NGS [facility] on her rectal tumor biopsy in 2021

## 2023-05-22 ENCOUNTER — Ambulatory Visit: Payer: Federal, State, Local not specified - PPO | Admitting: Hematology

## 2023-05-22 ENCOUNTER — Inpatient Hospital Stay: Payer: Federal, State, Local not specified - PPO | Admitting: Hematology

## 2023-05-22 ENCOUNTER — Other Ambulatory Visit: Payer: Federal, State, Local not specified - PPO

## 2023-05-22 ENCOUNTER — Inpatient Hospital Stay: Payer: Federal, State, Local not specified - PPO

## 2023-05-22 ENCOUNTER — Ambulatory Visit: Payer: Federal, State, Local not specified - PPO

## 2023-05-22 VITALS — BP 140/86 | HR 95 | Temp 98.2°F | Ht 66.0 in | Wt 102.6 lb

## 2023-05-22 DIAGNOSIS — Z95828 Presence of other vascular implants and grafts: Secondary | ICD-10-CM

## 2023-05-22 DIAGNOSIS — C787 Secondary malignant neoplasm of liver and intrahepatic bile duct: Secondary | ICD-10-CM

## 2023-05-22 DIAGNOSIS — C20 Malignant neoplasm of rectum: Secondary | ICD-10-CM

## 2023-05-22 DIAGNOSIS — D500 Iron deficiency anemia secondary to blood loss (chronic): Secondary | ICD-10-CM

## 2023-05-22 DIAGNOSIS — Z5112 Encounter for antineoplastic immunotherapy: Secondary | ICD-10-CM | POA: Diagnosis not present

## 2023-05-22 LAB — CBC WITH DIFFERENTIAL (CANCER CENTER ONLY)
Abs Immature Granulocytes: 0.01 10*3/uL (ref 0.00–0.07)
Basophils Absolute: 0 10*3/uL (ref 0.0–0.1)
Basophils Relative: 1 %
Eosinophils Absolute: 0 10*3/uL (ref 0.0–0.5)
Eosinophils Relative: 2 %
HCT: 28.7 % — ABNORMAL LOW (ref 36.0–46.0)
Hemoglobin: 9.3 g/dL — ABNORMAL LOW (ref 12.0–15.0)
Immature Granulocytes: 1 %
Lymphocytes Relative: 39 %
Lymphs Abs: 0.7 10*3/uL (ref 0.7–4.0)
MCH: 29.1 pg (ref 26.0–34.0)
MCHC: 32.4 g/dL (ref 30.0–36.0)
MCV: 89.7 fL (ref 80.0–100.0)
Monocytes Absolute: 0.1 10*3/uL (ref 0.1–1.0)
Monocytes Relative: 5 %
Neutro Abs: 0.9 10*3/uL — ABNORMAL LOW (ref 1.7–7.7)
Neutrophils Relative %: 52 %
Platelet Count: 92 10*3/uL — ABNORMAL LOW (ref 150–400)
RBC: 3.2 MIL/uL — ABNORMAL LOW (ref 3.87–5.11)
RDW: 13.3 % (ref 11.5–15.5)
WBC Count: 1.7 10*3/uL — ABNORMAL LOW (ref 4.0–10.5)
nRBC: 0 % (ref 0.0–0.2)

## 2023-05-22 LAB — COMPREHENSIVE METABOLIC PANEL
ALT: 7 U/L (ref 0–44)
AST: 18 U/L (ref 15–41)
Albumin: 3.5 g/dL (ref 3.5–5.0)
Alkaline Phosphatase: 93 U/L (ref 38–126)
Anion gap: 6 (ref 5–15)
BUN: 14 mg/dL (ref 8–23)
CO2: 26 mmol/L (ref 22–32)
Calcium: 8.6 mg/dL — ABNORMAL LOW (ref 8.9–10.3)
Chloride: 103 mmol/L (ref 98–111)
Creatinine, Ser: 0.66 mg/dL (ref 0.44–1.00)
GFR, Estimated: >60 mL/min (ref >60)
Glucose, Bld: 110 mg/dL — ABNORMAL HIGH (ref 70–99)
Potassium: 3.4 mmol/L — ABNORMAL LOW (ref 3.5–5.1)
Sodium: 135 mmol/L (ref 135–145)
Total Bilirubin: 0.4 mg/dL (ref <1.2)
Total Protein: 7.1 g/dL (ref 6.5–8.1)

## 2023-05-22 LAB — CEA (IN HOUSE-CHCC): CEA (CHCC-In House): 250.2 ng/mL — ABNORMAL HIGH (ref 0.00–5.00)

## 2023-05-22 LAB — FERRITIN: Ferritin: 114 ng/mL (ref 11–307)

## 2023-05-22 MED ORDER — HEPARIN SOD (PORK) LOCK FLUSH 100 UNIT/ML IV SOLN
500.0000 [IU] | Freq: Once | INTRAVENOUS | Status: AC | PRN
  Administered 2023-05-22: 500 [IU]

## 2023-05-22 MED ORDER — SODIUM CHLORIDE 0.9 % IV SOLN
INTRAVENOUS | Status: DC
Start: 2023-05-22 — End: 2023-05-22

## 2023-05-22 MED ORDER — SODIUM CHLORIDE 0.9 % IV SOLN
5.0000 mg/kg | Freq: Once | INTRAVENOUS | Status: AC
  Administered 2023-05-22: 250 mg via INTRAVENOUS
  Filled 2023-05-22: qty 10

## 2023-05-22 MED ORDER — SODIUM CHLORIDE 0.9% FLUSH
10.0000 mL | Freq: Once | INTRAVENOUS | Status: AC
  Administered 2023-05-22: 10 mL

## 2023-05-22 MED ORDER — SODIUM CHLORIDE 0.9% FLUSH
10.0000 mL | INTRAVENOUS | Status: DC | PRN
  Administered 2023-05-22: 10 mL

## 2023-05-22 NOTE — Patient Instructions (Signed)
 [facility] - A DEPT OF [facility]  Discharge Instructions: Thank you for choosing [facility] to provide your oncology and hematology care.   If you have a lab appointment with the Cancer Center, please go directly to the Cancer Center and check in at the registration area.   Wear comfortable clothing and clothing appropriate for easy access to any Portacath or PICC line.   We strive to give you quality time with your provider. You may need to reschedule your appointment if you arrive late (15 or more minutes).  Arriving late affects you and other patients whose appointments are after yours.  Also, if you miss three or more appointments without notifying the office, you may be dismissed from the clinic at the provider's discretion.      For prescription refill requests, have your pharmacy contact our office and allow 72 hours for refills to be completed.    Today you received the following chemotherapy and/or immunotherapy agents: MVASI     To help prevent nausea and vomiting after your treatment, we encourage you to take your nausea medication as directed.  BELOW ARE SYMPTOMS THAT SHOULD BE REPORTED IMMEDIATELY: *FEVER GREATER THAN 100.4 F (38 C) OR HIGHER *CHILLS OR SWEATING *NAUSEA AND VOMITING THAT IS NOT CONTROLLED WITH YOUR NAUSEA MEDICATION *UNUSUAL SHORTNESS OF BREATH *UNUSUAL BRUISING OR BLEEDING *URINARY PROBLEMS (pain or burning when urinating, or frequent urination) *BOWEL PROBLEMS (unusual diarrhea, constipation, pain near the anus) TENDERNESS IN MOUTH AND THROAT WITH OR WITHOUT PRESENCE OF ULCERS (sore throat, sores in mouth, or a toothache) UNUSUAL RASH, SWELLING OR PAIN  UNUSUAL VAGINAL DISCHARGE OR ITCHING   Items with * indicate a potential emergency and should be followed up as soon as possible or go to the Emergency Department if any problems should occur.  Please show the CHEMOTHERAPY ALERT CARD or IMMUNOTHERAPY  ALERT CARD at check-in to the Emergency Department and triage nurse.  Should you have questions after your visit or need to cancel or reschedule your appointment, please contact [facility] - A DEPT OF [facility]  Dept: [phone]  and follow the prompts.  Office hours are 8:00 a.m. to 4:30 p.m. Monday - Friday. Please note that voicemails left after 4:00 p.m. may not be returned until the following business day.  We are closed weekends and major holidays. You have access to a nurse at all times for urgent questions. Please call the main number to the clinic Dept: [phone] and follow the prompts.   For any non-urgent questions, you may also contact your provider using MyChart. We now offer e-Visits for anyone 43 and older to request care online for non-urgent symptoms. For details visit [email or web address].   Also download the MyChart app! Go to the app store, search "MyChart", open the app, select [place], and log in with your MyChart username and password.

## 2023-05-22 NOTE — Progress Notes (Signed)
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  -She began first-line chemo with dose-reduced FOLFIRI and Beva in 2/22.  -we changed to FOLFOX with continued Beva on 03/28/22. She tolerated well overall with some fatigue and cold sensitivity. -I personally reviewed her restaging CT from 10/07/2022 which showed overall SD in liver and rectum, stable and indermininate lung nodules, no other new lesions  -She is tolerating chemotherapy overall well, will continue. -I personally reviewed her restaging CT scan from October 07, 2022, which showed overall stable disease.  She has multiple small lung nodules, indeterminate, including one 7 mm nodule, will continue monitoring. 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 -Restaging CT scan on April 14, 2023 unfortunately showed disease progression in liver. -I discussed the third line chemotherapy options, I recommend Lonsurf and bevacizumab -will request [facility] on her rectal tumor biopsy in 2021    Assessment and Plan    Metastatic Colon Cancer 72 year old with  metastatic colon cancer on Lonsurf and bevacizumab (Avastin). Tolerating treatment well with minimal side effects, including occasional nausea and mild weight loss. On a 28-day cycle for Lonsurf, next cycle starting December 2nd. Bevacizumab infusions every two weeks, with schedule adjustments in January. Blood pressure at 140/86, weight at 102 lbs. CA marker monitored every four weeks. Explained that CA marker may not decrease immediately and could indicate stable disease. - Continue Lonsurf as scheduled, starting next cycle on December 2nd - Administer bevacizumab infusion on December 5th and adjust schedule to Mondays starting January - Monitor CEA every four weeks - Schedule next scan in three months - Monitor for side effects and adjust medication as needed - Encourage use of heating pad and exercise for muscular achiness and joint pain  General Health Maintenance No new medications needed. - Monitor blood pressure and weight regularly  Plan -Lab reviewed, will proceed to bevacizumab today and continue every 2 weeks -She will start to cycle 2 Lonsurf on  June 02, 2023 -f/u in 2 weeks         SUMMARY OF ONCOLOGIC HISTORY: Oncology History Overview Note  Cancer Staging Cancer of ascending colon [facility]) Staging form: Colon and Rectum, AJCC 7th Edition - Clinical stage from 02/07/2016: Stage IIIC (T4b, N1b, M0) - Signed by [name], MD on 03/04/2016 Laterality: Right Residual tumor (R): R2 - Macroscopic    Cancer of ascending colon (HCC)  10/25/2015 Imaging   CT ABD/PELVIS:  Inflammatory changes inferior to the cecal tip appear improved, there is still irregular soft tissue thickening of the cecal tip, and there are adjacent prominent lymph nodes in the ileocolonic mesentery, measuring 13 mm on image 49 and  8 mm on image 52. In addition, there is a 2.5 x 1.8 cm nodule on image 46 which has central low density. Therefore, these findings are moderately suspicious for an underlying  cecal malignancy with perforation.    01/19/2016 Procedure   COLONOSCOPY per Dr. [name]: Fungating, ulcerated mass almost obstructing mid ascending colon   01/19/2016 Initial Biopsy   Diagnosis Surgical [P], cecal mass - INVASIVE ADENOCARCINOMA WITH ULCERATION. - SEE COMMENT.   02/05/2016 Tumor Marker   Patient's tumor was tested for the following markers: CEA Results of the tumor marker test revealed 5.7.   02/07/2016 Initial Diagnosis   Cancer of ascending colon (HCC)   02/07/2016 Definitive Surgery   Laparoscopic assisted right hemicolectomy and right salpingo oopherectomy--[name]   02/07/2016 Pathologic Stage   p T4 N1b   2/43 nodes +   02/07/2016 Pathology Results   MMR normal; G2 adenocarcinoma;proximal & distal margins negative; soft tissue mass on pelvic sidewall + for adenocarcinoma with positive margin MSI Stable   03/08/2016 Imaging   CT chest negative for metastasis.    03/19/2016 - 04/25/2016 Radiation Therapy   Adjuvant irradiation, 50 gray in 28 fractions   03/19/2016 - 04/22/2016 Chemotherapy   Xeloda 1500 mg twice daily, started on 03/19/2016, dose reduced to 1000 mg twice daily from week 3 due to neutropenia, and patient stopped 3 days before last dose radiation due to difficulty swallowing the pill    05/20/2016 -  Adjuvant Chemotherapy   Patient declined adjuvant chemotherapy   09/16/2016 Imaging   CT CAP w Contrast 1. No evidence of local tumor recurrence at the ileocolic anastomosis. 2. No findings suspicious for metastatic disease in the chest, abdomen or pelvis. 3. Nonspecific trace free fluid in the pelvic cul-de-sac. 4. Stable solitary 3 mm right upper lobe pulmonary nodule, for which 6 month stability has been demonstrated, probably benign. 5. Additional findings include stable right posterior pericardial cyst and small calcified uterine fibroids.   05/13/2017 Imaging   CT CAP W Contrast 05/13/17 IMPRESSION: 1. No current findings of residual  or recurrent malignancy. 2. Mild prominence of stool throughout the colon. Nondistended portions of the rectum. 3. Several tiny pulmonary nodules are stable from the earliest available comparison of 03/08/2016 and probably benign, but may merit surveillance. 4. Other imaging findings of potential clinical significance: Old granulomatous disease. Aortoiliac atherosclerotic vascular disease. Lumbar spondylosis and degenerative disc disease. Stable amount of trace free pelvic fluid.   04/27/2018 Imaging   04/27/2018 CT CAP IMPRESSION: Stable exam. No evidence of recurrent or metastatic carcinoma within the chest, abdomen, or pelvis   04/19/2019 Imaging   CT CAP W Contrast  IMPRESSION: Chest Impression:   1. No evidence of thoracic metastasis. 2. Stable small bilateral pulmonary nodules.   Abdomen / Pelvis Impression:   1. No evidence local colorectal carcinoma recurrence or metastasis in the abdomen pelvis. 2. Post RIGHT hemicolectomy.   01/22/2021 Imaging   CT CAP  IMPRESSION: CT CHEST IMPRESSION   1. Similar nonspecific pulmonary nodules. 2. New posterior left upper lobe reticulonodular opacity, suspicious for interval mild infection or inflammation. 3. No thoracic adenopathy.   CT ABDOMEN AND PELVIS IMPRESSION   1. Further decrease in size of high left hepatic lobe 3 mm low-density lesion. No new or progressive metastatic disease within the abdomen or pelvis. 2. Similar trace free pelvic fluid. 3. Similar nonspecific mid rectal wall thickening. 4.  Aortic Atherosclerosis (ICD10-I70.0).   04/23/2021 Imaging   CT CAP  IMPRESSION: 1. Treated  metastatic lesion between segments 2 and 3 of the liver, slightly smaller and less distinct than prior examination. No other signs of definite metastatic disease elsewhere in the abdomen or pelvis. 2. Multiple small pulmonary nodules, stable compared to the prior examination, favored to be benign. No definitive findings to  suggest metastatic disease to the thorax. 3. Aortic atherosclerosis. 4. Additional incidental findings, as above.   08/13/2021 Imaging   EXAM: CT CHEST, ABDOMEN, AND PELVIS WITH CONTRAST  IMPRESSION: 1. A previously seen PET avid lesion of the anterior left lobe of the liver, hepatic segment II, is no longer discretely appreciable consistent with treatment response of a hepatic metastasis. 2. No evidence of new metastatic disease in the chest, abdomen, or pelvis. 3. Interval increase in a small focus of consolidation and nodularity of the medial left upper lobe, consistent with minimal, ongoing atypical infection. Additional tiny bilateral pulmonary nodules are stable and almost certainly incidental benign. Attention on follow-up. 4. Status post right hemicolectomy and ileocolic anastomosis.   07/02/2022 Imaging    IMPRESSION: CHEST IMPRESSION:   1. No evidence of thoracic metastasis. 2. Stable small pulmonary nodules.   PELVIS IMPRESSION:   1. Stable to slight decrease in size of subcapsular lesion in the RIGHT hepatic lobe. 2. No evidence of new or progressive disease in the abdomen pelvis. 3.  Aortic Atherosclerosis (ICD10-I70.0).     12/31/2022 Imaging    IMPRESSION: 1. Right hepatic lobe metastasis has undergone mild-to-moderate enlargement since 10/07/2022. A left hepatic lobe 5 mm lesion is not readily apparent on the prior, suspicious for a new metastasis. 2. Similar nonspecific tiny pulmonary nodules. 3. Similar amorphous soft tissue thickening within the posterior superior right hemipelvis, hypermetabolic on prior PET and indeterminate for residual disease versus scarring. 4. Similar equivocal soft tissue fullness within the rectum. 5. Increased size of an abdominopelvic ventral wall nodule for which subcutaneous metastasis are a concern. 6. New trace right pelvic fluid. 7. Incidental findings, including: Aortic Atherosclerosis (ICD10-I70.0). Possible  constipation   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)  06/15/2020 Procedure   Screening Colonoscopy by Dr [name]  IMPRESSION - Decreased sphincter tone and internal hemorrhoids that prolapse with straining, but require manual replacement into the anal canal (Grade III) found on digital rectal exam. - Patent side-to-side ileo-colonic anastomosis, characterized by healthy appearing mucosa. - The examined portion of the ileum was normal. - One diminutive polyp in the proximal transverse colon, removed with a cold biopsy forceps. Resected and retrieved. - Likely malignant partially obstructing tumor in the mid rectum. Biopsied. Tattooed. - The examination was otherwise normal on direct and retroflexion views.   06/15/2020 Initial Biopsy   Diagnosis 1. Transverse Colon Polyp - HYPERPLASTIC POLYP 2. Rectum, biopsy - ADENOCARCINOMA ARISING IN A TUBULAR ADENOMA WITH HIGH-GRADE DYSPLASIA. SEE NOTE Diagnosis Note 2. Dr. Luisa Hart reviewed the case and concurs with the diagnosis. Dr. [name] was notified on 06/16/2020.   06/28/2020 Imaging   CT CAP  IMPRESSION: 1. New low-density focus in the anterior aspect of the lateral segment LEFT hepatic lobe measuring 1.2 x 1.0 cm, compatible with small metastatic lesion in the LEFT hepatic lobe. 2. Soft tissue in the RIGHT iliac fossa following RIGHT hemicolectomy invades the psoas musculature and is slowly enlarging over time, more linear on the prior study now highly concerning for recurrence/metastasis to this location. 3. Signs of enteritis, potentially post radiation changes of the small bowel. Tethered small bowel in the RIGHT lower quadrant shows focal thickening and narrowing suspicious for  small bowel involvement and developing partial obstruction though currently contrast passes beyond this point into the colon. 4. Rectal thickening in this patient with known rectal mass as described. 5. No evidence of metastatic disease in the  chest. 6. Stable small pulmonary nodules. 7.  and aortic atherosclerosis.   Aortic Atherosclerosis (ICD10-I70.0) and Emphysema (ICD10-J43.9).   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 Exam positive for FDG avid rectal tumor which corresponds to the recent colonoscopy findings. 2. FDG avid soft tissue mass within the right iliac fossa is noted and consistent with local tumor recurrence from previous ascending colon tumor. 3. Lateral segment left lobe of liver lesion is FDG avid concerning for liver metastasis. 4. No specific findings identified to suggest metastatic disease to the chest.   08/10/2020 -  Chemotherapy   First-line FOLFIRI q2weeks starting 08/10/20. dose reduced with cycle 1. Irinotecan/5FU increased and Bevacizumab added with cycle 2 on 08/23/2020    08/17/2020 - 03/09/2022 Chemotherapy   Patient is on Treatment Plan : COLORECTAL FOLFIRI + Bevacizumab q14d     10/27/2020 Imaging   CT CAP  IMPRESSION: 1. Interval decrease in size of the hypermetabolic left hepatic lesion, consistent with metastatic disease. No new liver lesion evident. 2. Interval resolution of the hypermetabolic soft tissue lesion along the right iliac fossa with no measurable soft tissue lesion remaining at this location today. 3. Similar appearance of soft tissue fullness in the rectum at the site of the hypermetabolic lesion seen previously. 4. Stable tiny bilateral pulmonary nodules. Continued attention on follow-up recommended. 5. Small volume free fluid in the pelvis. 6. Aortic Atherosclerosis (ICD10-I70.0).   01/22/2021 Imaging   CT CAP  IMPRESSION: CT CHEST IMPRESSION   1. Similar nonspecific pulmonary nodules. 2. New posterior left upper lobe reticulonodular opacity, suspicious for interval mild infection or inflammation. 3. No thoracic adenopathy.   CT ABDOMEN AND PELVIS IMPRESSION   1. Further decrease in  size of high left hepatic lobe 3 mm low-density lesion. No new or progressive metastatic disease within the abdomen or pelvis. 2. Similar trace free pelvic fluid. 3. Similar nonspecific mid rectal wall thickening. 4.  Aortic Atherosclerosis (ICD10-I70.0).   04/23/2021 Imaging   CT CAP  IMPRESSION: 1. Treated metastatic lesion between segments 2 and 3 of the liver, slightly smaller and less distinct than prior examination. No other signs of definite metastatic disease elsewhere in the abdomen or pelvis. 2. Multiple small pulmonary nodules, stable compared to the prior examination, favored to be benign. No definitive findings to suggest metastatic disease to the thorax. 3. Aortic atherosclerosis. 4. Additional incidental findings, as above.   08/13/2021 Imaging   EXAM: CT CHEST, ABDOMEN, AND PELVIS WITH CONTRAST  IMPRESSION: 1. A previously seen PET avid lesion of the anterior left lobe of the liver, hepatic segment II, is no longer discretely appreciable consistent with treatment response of a hepatic metastasis. 2. No evidence of new metastatic disease in the chest, abdomen, or pelvis. 3. Interval increase in a small focus of consolidation and nodularity of the medial left upper lobe, consistent with minimal, ongoing atypical infection. Additional tiny bilateral pulmonary nodules are stable and almost certainly incidental benign. Attention on follow-up. 4. Status post right hemicolectomy and ileocolic anastomosis.   03/28/2022 - 04/05/2023 Chemotherapy   Patient is on Treatment Plan : COLORECTAL FOLFOX + Bevacizumab q14d     07/02/2022 Imaging    IMPRESSION: CHEST IMPRESSION:   1.  No evidence of thoracic metastasis. 2. Stable small pulmonary nodules.   PELVIS IMPRESSION:   1. Stable to slight decrease in size of subcapsular lesion in the RIGHT hepatic lobe. 2. No evidence of new or progressive disease in the abdomen pelvis.   10/07/2022 Imaging    IMPRESSION: 1.  Stable hypovascular mass in the periphery of the right lobe of the liver, which likely represents a treated metastatic lesion. Stable subcentimeter lesion just medial to this is also similar to the recent prior examination. No new hepatic lesions are otherwise noted. 2. Persistent but stable poorly defined soft tissue thickening in the right lower quadrant associated with multiple small bowel loops and the overlying iliopsoas musculature, which corresponds to focal hypermetabolism on remote prior PET-CT. Given the stability, this likely represents a treated metastatic lesion. Continued attention on follow-up studies is recommended. 3. Persistent mass-like thickening in the proximal rectum corresponding to previously noted hypermetabolic rectal neoplasm on prior PET-CT. This currently measures approximately 2.8 x 2.3 cm and is slightly more apparent than the most recent prior study. 4. Multiple small pulmonary nodules generally stable compared to the prior study, with exception of a new branching nodule in the anterior aspect of the left upper lobe measuring 7 x 3 mm (mean diameter 5 mm). This is nonspecific. Close attention on follow-up studies is recommended to ensure stability. 5. Aortic atherosclerosis. 6. Additional incidental findings, as above.     12/31/2022 Imaging    IMPRESSION: 1. Right hepatic lobe metastasis has undergone mild-to-moderate enlargement since 10/07/2022. A left hepatic lobe 5 mm lesion is not readily apparent on the prior, suspicious for a new metastasis. 2. Similar nonspecific tiny pulmonary nodules. 3. Similar amorphous soft tissue thickening within the posterior superior right hemipelvis, hypermetabolic on prior PET and indeterminate for residual disease versus scarring. 4. Similar equivocal soft tissue fullness within the rectum. 5. Increased size of an abdominopelvic ventral wall nodule for which subcutaneous metastasis are a concern. 6. New trace  right pelvic fluid. 7. Incidental findings, including: Aortic Atherosclerosis (ICD10-I70.0). Possible constipation   05/08/2023 -  Chemotherapy   Patient is on Treatment Plan : COLORECTAL Bevacizumab + Trifluridine/Tipiracil q28d        Discussed the use of AI scribe software for clinical note transcription with the patient, who gave verbal consent to proceed.  History of Present Illness   A 72 year old patient with metastatic colon cancer presents for a follow-up appointment. The patient is currently on a regimen of oral chemotherapy (Lonsurf) and intravenous chemotherapy (bevacizumab, also known as Avastin). The patient reports tolerating the treatment well, with occasional nausea and some fatigue. The patient has been on a two-week break from the oral chemotherapy and is scheduled to restart the medication on December 2nd. The patient's weight has remained stable, and she has not reported any significant side effects from the treatment. The patient has noticed occasional achiness, which she attributes to the weather.         All other systems were reviewed with the patient and are negative.  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 Some residual intraretinal fluid remains, very small localized subfoveal of fluid remains although this continues to slowly resorb. We'll continue to observe.   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  ALLERGIES:  is allergic to venofer [iron sucrose], fish allergy, peanut-containing drug products, soy allergy, and buspirone.  MEDICATIONS:  Current Outpatient Medications  Medication Sig Dispense Refill   ALPRAZolam (XANAX) 0.25 MG tablet Take 1 tablet (0.25 mg total) by mouth daily as needed for anxiety. 30 tablet 0   Cholecalciferol (VITAMIN D3 PO) Take by mouth daily.     docusate sodium (COLACE) 100 MG capsule 1 capsule as needed     gabapentin (NEURONTIN) 100 MG capsule Take 1 capsule (100 mg total) by mouth at bedtime. 30 capsule 1   lidocaine-prilocaine (EMLA) cream Apply 1 Application topically as needed. 30 g 1   LONSURF 20-8.19 MG tablet TAKE 2 TABLETS IN MORNING AND 3 TABS IN EVENING. TAKE WITHIN 1 HR AFTEr AM & PM meals on days 1-5, 8-12. Repeat every 28 days. 60 tablet 1   NORVASC 2.5 MG tablet TAKE 1 TABLET BY MOUTH EVERY DAY 90 tablet 1   ondansetron (ZOFRAN) 8 MG tablet Take 1 tablet (8 mg total) by mouth every 8 (eight) hours as needed for nausea or vomiting. 20 tablet 2   prochlorperazine (COMPAZINE) 10 MG tablet Take 1 tablet (10 mg total) by mouth every 6 (six) hours as needed for nausea or vomiting. 30 tablet 2   Current Facility-Administered Medications  Medication Dose Route Frequency Provider Last Rate Last Admin   0.9 %  sodium chloride  infusion  500 mL Intravenous Once Sherrilyn Rist, MD       Facility-Administered Medications Ordered in Other Visits  Medication Dose Route Frequency Provider Last Rate Last Admin   0.9 %  sodium chloride infusion   Intravenous Continuous [name], MD   Stopped at 05/22/23 1055   sodium chloride flush (NS) 0.9 % injection 10 mL  10 mL Intracatheter PRN [name], MD   10 mL at 05/22/23 1054    PHYSICAL EXAMINATION: ECOG PERFORMANCE STATUS: 1 - Symptomatic but completely ambulatory  Vitals:   05/22/23 0858  BP: (!) 140/86  Pulse: 95  Temp: 98.2 F (36.8 C)  SpO2: 100%   Wt Readings from Last 3 Encounters:  05/22/23 102 lb 9.6 oz (46.5 kg)  05/08/23 104 lb 1.6 oz (47.2 kg)  04/24/23 108 lb (49 kg)     GENERAL:alert, no distress and comfortable SKIN: skin color, texture, turgor are normal, no rashes or significant lesions EYES: normal, Conjunctiva are pink and non-injected, sclera clear NECK: supple,  thyroid normal size, non-tender, without nodularity LYMPH:  no palpable lymphadenopathy in the cervical, axillary  LUNGS: clear to auscultation and percussion with normal breathing effort HEART: regular rate & rhythm and no murmurs and no lower extremity edema ABDOMEN:abdomen soft, non-tender and normal bowel sounds Musculoskeletal:no cyanosis of digits and no clubbing  NEURO: alert & oriented x 3 with fluent speech, no focal motor/sensory deficits  LABORATORY DATA:  I have reviewed the data as listed    Latest Ref Rng & Units 05/22/2023    8:21 AM 05/08/2023    8:04 AM 04/24/2023    8:15 AM  CBC  WBC 4.0 - 10.5 K/uL 1.7  5.2  3.4   Hemoglobin 12.0 - 15.0 g/dL 9.3  16.1  09.6   Hematocrit 36.0 - 46.0 % 28.7  35.2  32.5   Platelets 150 - 400 K/uL 92  140  134         Latest Ref Rng & Units 05/22/2023    8:21 AM 04/24/2023    8:15 AM 04/03/2023    8:00 AM  CMP  Glucose 70 - 99 mg/dL 045  87  87   BUN 8 - 23 mg/dL 14  12  12    Creatinine 0.44 - 1.00 mg/dL 4.09   8.11  9.14   Sodium 135 - 145 mmol/L 135  138  137   Potassium 3.5 - 5.1 mmol/L 3.4  4.1  4.1   Chloride 98 - 111 mmol/L 103  105  105   CO2 22 - 32 mmol/L 26  28  27    Calcium 8.9 - 10.3 mg/dL 8.6  9.0  8.9   Total Protein 6.5 - 8.1 g/dL 7.1  7.2  7.2   Total Bilirubin <1.2 mg/dL 0.4  0.4  0.3   Alkaline Phos 38 - 126 U/L 93  109  102   AST 15 - 41 U/L 18  21  23    ALT 0 - 44 U/L 7  10  10        RADIOGRAPHIC STUDIES: I have personally reviewed the radiological images as listed and agreed with the findings in the report. No results found.    Orders Placed This Encounter  Procedures   CBC with Differential (Cancer Center Only)    Standing Status:   Future    Standing Expiration Date:   07/27/2024   CBC with Differential (Cancer Center Only)    Standing Status:   Future    Standing Expiration Date:   08/10/2024   All questions were answered. The patient knows to call the clinic with any problems, questions or concerns. No barriers to learning was detected. The total time spent in the appointment was 25 minutes.     [name], MD 05/22/2023

## 2023-05-22 NOTE — Progress Notes (Signed)
Per Mosetta Putt, MD, okay to treat with Plt 92 and ANC 0.9.

## 2023-05-23 ENCOUNTER — Telehealth: Payer: Self-pay | Admitting: Pharmacist

## 2023-05-23 NOTE — Telephone Encounter (Signed)
Oral Chemotherapy Pharmacist Encounter   Received phone call from patient regarding confirming Lonsurf cycle plans for cycle 2 as well as refill information for Lonsurf.   Confirmed with patient that she will start cycle 06/02/23. Encouraged patient to go ahead and call [facility] (phone number provided to patient tele: [phone]) to set up next shipment of Lonsurf.   Patient expressed appreciation and understanding - patient knows to call the office with questions or concerns.  Lenord Carbo, PharmD, BCPS, [facility] Hematology/Oncology Clinical Pharmacist [facility] Oral Chemotherapy Navigation Clinics [phone] 05/23/2023 10:28 AM

## 2023-05-28 ENCOUNTER — Ambulatory Visit: Payer: Federal, State, Local not specified - PPO

## 2023-05-28 ENCOUNTER — Other Ambulatory Visit: Payer: Federal, State, Local not specified - PPO

## 2023-05-28 ENCOUNTER — Ambulatory Visit: Payer: Federal, State, Local not specified - PPO | Admitting: Nurse Practitioner

## 2023-06-03 ENCOUNTER — Other Ambulatory Visit: Payer: Self-pay | Admitting: Hematology

## 2023-06-05 ENCOUNTER — Encounter: Payer: Self-pay | Admitting: Nurse Practitioner

## 2023-06-05 ENCOUNTER — Inpatient Hospital Stay: Payer: Federal, State, Local not specified - PPO | Admitting: Nurse Practitioner

## 2023-06-05 ENCOUNTER — Ambulatory Visit: Payer: Federal, State, Local not specified - PPO

## 2023-06-05 ENCOUNTER — Ambulatory Visit: Payer: Federal, State, Local not specified - PPO | Admitting: Nurse Practitioner

## 2023-06-05 ENCOUNTER — Inpatient Hospital Stay: Payer: Federal, State, Local not specified - PPO | Attending: Nurse Practitioner

## 2023-06-05 ENCOUNTER — Other Ambulatory Visit: Payer: Federal, State, Local not specified - PPO

## 2023-06-05 ENCOUNTER — Inpatient Hospital Stay: Payer: Federal, State, Local not specified - PPO

## 2023-06-05 VITALS — BP 130/78 | HR 97 | Temp 97.8°F | Resp 17 | Wt 99.5 lb

## 2023-06-05 DIAGNOSIS — Z923 Personal history of irradiation: Secondary | ICD-10-CM | POA: Insufficient documentation

## 2023-06-05 DIAGNOSIS — F419 Anxiety disorder, unspecified: Secondary | ICD-10-CM | POA: Diagnosis not present

## 2023-06-05 DIAGNOSIS — Z9049 Acquired absence of other specified parts of digestive tract: Secondary | ICD-10-CM | POA: Insufficient documentation

## 2023-06-05 DIAGNOSIS — Z79899 Other long term (current) drug therapy: Secondary | ICD-10-CM | POA: Insufficient documentation

## 2023-06-05 DIAGNOSIS — R911 Solitary pulmonary nodule: Secondary | ICD-10-CM | POA: Diagnosis not present

## 2023-06-05 DIAGNOSIS — K5900 Constipation, unspecified: Secondary | ICD-10-CM | POA: Diagnosis not present

## 2023-06-05 DIAGNOSIS — Z1509 Genetic susceptibility to other malignant neoplasm: Secondary | ICD-10-CM | POA: Insufficient documentation

## 2023-06-05 DIAGNOSIS — C787 Secondary malignant neoplasm of liver and intrahepatic bile duct: Secondary | ICD-10-CM | POA: Insufficient documentation

## 2023-06-05 DIAGNOSIS — T451X5D Adverse effect of antineoplastic and immunosuppressive drugs, subsequent encounter: Secondary | ICD-10-CM | POA: Diagnosis not present

## 2023-06-05 DIAGNOSIS — C20 Malignant neoplasm of rectum: Secondary | ICD-10-CM | POA: Insufficient documentation

## 2023-06-05 DIAGNOSIS — Z5112 Encounter for antineoplastic immunotherapy: Secondary | ICD-10-CM | POA: Diagnosis present

## 2023-06-05 DIAGNOSIS — R110 Nausea: Secondary | ICD-10-CM | POA: Insufficient documentation

## 2023-06-05 DIAGNOSIS — I10 Essential (primary) hypertension: Secondary | ICD-10-CM | POA: Diagnosis not present

## 2023-06-05 DIAGNOSIS — G620 Drug-induced polyneuropathy: Secondary | ICD-10-CM | POA: Insufficient documentation

## 2023-06-05 LAB — CBC WITH DIFFERENTIAL (CANCER CENTER ONLY)
Abs Immature Granulocytes: 0 10*3/uL (ref 0.00–0.07)
Basophils Absolute: 0 10*3/uL (ref 0.0–0.1)
Basophils Relative: 1 %
Eosinophils Absolute: 0 10*3/uL (ref 0.0–0.5)
Eosinophils Relative: 1 %
HCT: 32.2 % — ABNORMAL LOW (ref 36.0–46.0)
Hemoglobin: 10.2 g/dL — ABNORMAL LOW (ref 12.0–15.0)
Immature Granulocytes: 0 %
Lymphocytes Relative: 34 %
Lymphs Abs: 0.7 10*3/uL (ref 0.7–4.0)
MCH: 28.5 pg (ref 26.0–34.0)
MCHC: 31.7 g/dL (ref 30.0–36.0)
MCV: 89.9 fL (ref 80.0–100.0)
Monocytes Absolute: 0.3 10*3/uL (ref 0.1–1.0)
Monocytes Relative: 13 %
Neutro Abs: 1.1 10*3/uL — ABNORMAL LOW (ref 1.7–7.7)
Neutrophils Relative %: 51 %
Platelet Count: 164 10*3/uL (ref 150–400)
RBC: 3.58 MIL/uL — ABNORMAL LOW (ref 3.87–5.11)
RDW: 14.2 % (ref 11.5–15.5)
WBC Count: 2 10*3/uL — ABNORMAL LOW (ref 4.0–10.5)
nRBC: 0 % (ref 0.0–0.2)

## 2023-06-05 MED ORDER — SODIUM CHLORIDE 0.9 % IV SOLN
INTRAVENOUS | Status: DC
Start: 2023-06-05 — End: 2023-06-05

## 2023-06-05 MED ORDER — SODIUM CHLORIDE 0.9% FLUSH
10.0000 mL | INTRAVENOUS | Status: DC | PRN
  Administered 2023-06-05: 10 mL

## 2023-06-05 MED ORDER — SODIUM CHLORIDE 0.9 % IV SOLN
5.0000 mg/kg | Freq: Once | INTRAVENOUS | Status: AC
  Administered 2023-06-05: 250 mg via INTRAVENOUS
  Filled 2023-06-05: qty 10

## 2023-06-05 MED ORDER — HEPARIN SOD (PORK) LOCK FLUSH 100 UNIT/ML IV SOLN
500.0000 [IU] | Freq: Once | INTRAVENOUS | Status: AC | PRN
  Administered 2023-06-05: 500 [IU]

## 2023-06-05 NOTE — Patient Instructions (Signed)
[facility] - A DEPT OF [facility]  Discharge Instructions: Thank you for choosing [facility] to provide your oncology and hematology care.   If you have a lab appointment with the Cancer Center, please go directly to the Cancer Center and check in at the registration area.   Wear comfortable clothing and clothing appropriate for easy access to any Portacath or PICC line.   We strive to give you quality time with your provider. You may need to reschedule your appointment if you arrive late (15 or more minutes).  Arriving late affects you and other patients whose appointments are after yours.  Also, if you miss three or more appointments without notifying the office, you may be dismissed from the clinic at the provider's discretion.      For prescription refill requests, have your pharmacy contact our office and allow 72 hours for refills to be completed.    Today you received the following chemotherapy and/or immunotherapy agents: MVASI     To help prevent nausea and vomiting after your treatment, we encourage you to take your nausea medication as directed.  BELOW ARE SYMPTOMS THAT SHOULD BE REPORTED IMMEDIATELY: *FEVER GREATER THAN 100.4 F (38 C) OR HIGHER *CHILLS OR SWEATING *NAUSEA AND VOMITING THAT IS NOT CONTROLLED WITH YOUR NAUSEA MEDICATION *UNUSUAL SHORTNESS OF BREATH *UNUSUAL BRUISING OR BLEEDING *URINARY PROBLEMS (pain or burning when urinating, or frequent urination) *BOWEL PROBLEMS (unusual diarrhea, constipation, pain near the anus) TENDERNESS IN MOUTH AND THROAT WITH OR WITHOUT PRESENCE OF ULCERS (sore throat, sores in mouth, or a toothache) UNUSUAL RASH, SWELLING OR PAIN  UNUSUAL VAGINAL DISCHARGE OR ITCHING   Items with * indicate a potential emergency and should be followed up as soon as possible or go to the Emergency Department if any problems should occur.  Please show the CHEMOTHERAPY ALERT CARD or IMMUNOTHERAPY ALERT  CARD at check-in to the Emergency Department and triage nurse.  Should you have questions after your visit or need to cancel or reschedule your appointment, please contact [facility] - A DEPT OF [facility]  Dept: [phone]  and follow the prompts.  Office hours are 8:00 a.m. to 4:30 p.m. Monday - Friday. Please note that voicemails left after 4:00 p.m. may not be returned until the following business day.  We are closed weekends and major holidays. You have access to a nurse at all times for urgent questions. Please call the main number to the clinic Dept: [phone] and follow the prompts.   For any non-urgent questions, you may also contact your provider using MyChart. We now offer e-Visits for anyone 109 and older to request care online for non-urgent symptoms. For details visit [email or web address].   Also download the MyChart app! Go to the app store, search "MyChart", open the app, select [place], and log in with your MyChart username and password.

## 2023-06-05 NOTE — Progress Notes (Signed)
Patient Care Team: Patient, No Pcp Per as PCP - General (General Practice) [name], NP as Nurse Practitioner (Oncology) [name], MD as Consulting Physician (Oncology) [name], RN (Inactive) as Oncology Nurse Navigator   CHIEF COMPLAINT: Follow-up metastatic colorectal cancer  Oncology History Overview Note  Cancer Staging Cancer of ascending colon [facility]) Staging form: Colon and Rectum, AJCC 7th Edition - Clinical stage from 02/07/2016: Stage IIIC (T4b, N1b, M0) - Signed by [name], MD on 03/04/2016 Laterality: Right Residual tumor (R): R2 - Macroscopic    Cancer of ascending colon (HCC)  10/25/2015 Imaging   CT ABD/PELVIS:  Inflammatory changes inferior to the cecal tip appear improved, there is still irregular soft tissue thickening of the cecal tip, and there are adjacent prominent lymph nodes in the ileocolonic mesentery, measuring 13 mm on image 49 and 8 mm on image 52. In addition, there is a 2.5 x 1.8 cm nodule on image 46 which has central low density. Therefore, these findings are moderately suspicious for an underlying cecal malignancy with perforation.    01/19/2016 Procedure   COLONOSCOPY per Dr. [name]: Fungating, ulcerated mass almost obstructing mid ascending colon   01/19/2016 Initial Biopsy   Diagnosis Surgical [P], cecal mass - INVASIVE ADENOCARCINOMA WITH ULCERATION. - SEE COMMENT.   02/05/2016 Tumor Marker   Patient's tumor was tested for the following markers: CEA Results of the tumor marker test revealed 5.7.   02/07/2016 Initial Diagnosis   Cancer of ascending colon (HCC)   02/07/2016 Definitive Surgery   Laparoscopic assisted right hemicolectomy and right salpingo oopherectomy--[name]   02/07/2016 Pathologic Stage   p T4 N1b   2/43 nodes +   02/07/2016 Pathology Results   MMR normal; G2 adenocarcinoma;proximal & distal margins negative; soft tissue mass on pelvic sidewall + for adenocarcinoma with positive margin MSI Stable    03/08/2016 Imaging   CT chest negative for metastasis.    03/19/2016 - 04/25/2016 Radiation Therapy   Adjuvant irradiation, 50 gray in 28 fractions   03/19/2016 - 04/22/2016 Chemotherapy   Xeloda 1500 mg twice daily, started on 03/19/2016, dose reduced to 1000 mg twice daily from week 3 due to neutropenia, and patient stopped 3 days before last dose radiation due to difficulty swallowing the pill    05/20/2016 -  Adjuvant Chemotherapy   Patient declined adjuvant chemotherapy   09/16/2016 Imaging   CT CAP w Contrast 1. No evidence of local tumor recurrence at the ileocolic anastomosis. 2. No findings suspicious for metastatic disease in the chest, abdomen or pelvis. 3. Nonspecific trace free fluid in the pelvic cul-de-sac. 4. Stable solitary 3 mm right upper lobe pulmonary nodule, for which 6 month stability has been demonstrated, probably benign. 5. Additional findings include stable right posterior pericardial cyst and small calcified uterine fibroids.   05/13/2017 Imaging   CT CAP W Contrast 05/13/17 IMPRESSION: 1. No current findings of residual or recurrent malignancy. 2. Mild prominence of stool throughout the colon. Nondistended portions of the rectum. 3. Several tiny pulmonary nodules are stable from the earliest available comparison of 03/08/2016 and probably benign, but may merit surveillance. 4. Other imaging findings of potential clinical significance: Old granulomatous disease. Aortoiliac atherosclerotic vascular disease. Lumbar spondylosis and degenerative disc disease. Stable amount of trace free pelvic fluid.   04/27/2018 Imaging   04/27/2018 CT CAP IMPRESSION: Stable exam. No evidence of recurrent or metastatic carcinoma within the chest, abdomen, or pelvis   04/19/2019 Imaging   CT CAP W  Contrast  IMPRESSION: Chest Impression:   1. No evidence of thoracic metastasis. 2. Stable small bilateral pulmonary nodules.   Abdomen / Pelvis Impression:   1.  No evidence local colorectal carcinoma recurrence or metastasis in the abdomen pelvis. 2. Post RIGHT hemicolectomy.   01/22/2021 Imaging   CT CAP  IMPRESSION: CT CHEST IMPRESSION   1. Similar nonspecific pulmonary nodules. 2. New posterior left upper lobe reticulonodular opacity, suspicious for interval mild infection or inflammation. 3. No thoracic adenopathy.   CT ABDOMEN AND PELVIS IMPRESSION   1. Further decrease in size of high left hepatic lobe 3 mm low-density lesion. No new or progressive metastatic disease within the abdomen or pelvis. 2. Similar trace free pelvic fluid. 3. Similar nonspecific mid rectal wall thickening. 4.  Aortic Atherosclerosis (ICD10-I70.0).   04/23/2021 Imaging   CT CAP  IMPRESSION: 1. Treated metastatic lesion between segments 2 and 3 of the liver, slightly smaller and less distinct than prior examination. No other signs of definite metastatic disease elsewhere in the abdomen or pelvis. 2. Multiple small pulmonary nodules, stable compared to the prior examination, favored to be benign. No definitive findings to suggest metastatic disease to the thorax. 3. Aortic atherosclerosis. 4. Additional incidental findings, as above.   08/13/2021 Imaging   EXAM: CT CHEST, ABDOMEN, AND PELVIS WITH CONTRAST  IMPRESSION: 1. A previously seen PET avid lesion of the anterior left lobe of the liver, hepatic segment II, is no longer discretely appreciable consistent with treatment response of a hepatic metastasis. 2. No evidence of new metastatic disease in the chest, abdomen, or pelvis. 3. Interval increase in a small focus of consolidation and nodularity of the medial left upper lobe, consistent with minimal, ongoing atypical infection. Additional tiny bilateral pulmonary nodules are stable and almost certainly incidental benign. Attention on follow-up. 4. Status post right hemicolectomy and ileocolic anastomosis.   07/02/2022 Imaging     IMPRESSION: CHEST IMPRESSION:   1. No evidence of thoracic metastasis. 2. Stable small pulmonary nodules.   PELVIS IMPRESSION:   1. Stable to slight decrease in size of subcapsular lesion in the RIGHT hepatic lobe. 2. No evidence of new or progressive disease in the abdomen pelvis. 3.  Aortic Atherosclerosis (ICD10-I70.0).     12/31/2022 Imaging    IMPRESSION: 1. Right hepatic lobe metastasis has undergone mild-to-moderate enlargement since 10/07/2022. A left hepatic lobe 5 mm lesion is not readily apparent on the prior, suspicious for a new metastasis. 2. Similar nonspecific tiny pulmonary nodules. 3. Similar amorphous soft tissue thickening within the posterior superior right hemipelvis, hypermetabolic on prior PET and indeterminate for residual disease versus scarring. 4. Similar equivocal soft tissue fullness within the rectum. 5. Increased size of an abdominopelvic ventral wall nodule for which subcutaneous metastasis are a concern. 6. New trace right pelvic fluid. 7. Incidental findings, including: Aortic Atherosclerosis (ICD10-I70.0). 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 - Patent side-to-side ileo-colonic anastomosis, characterized by healthy appearing mucosa. - The examined portion of the ileum was normal. - One diminutive polyp in the proximal transverse colon, removed with a cold biopsy forceps. Resected and retrieved. - Likely malignant partially obstructing tumor in the mid rectum. Biopsied. Tattooed. - The examination was otherwise normal on direct and retroflexion views.   06/15/2020 Initial  Biopsy   Diagnosis 1. Transverse Colon Polyp - HYPERPLASTIC POLYP 2. Rectum, biopsy -  ADENOCARCINOMA ARISING IN A TUBULAR ADENOMA WITH HIGH-GRADE DYSPLASIA. SEE NOTE Diagnosis Note 2. Dr. Luisa Hart reviewed the case and concurs with the diagnosis. Dr. [name] was notified on 06/16/2020.   06/28/2020 Imaging   CT CAP  IMPRESSION: 1. New low-density focus in the anterior aspect of the lateral segment LEFT hepatic lobe measuring 1.2 x 1.0 cm, compatible with small metastatic lesion in the LEFT hepatic lobe. 2. Soft tissue in the RIGHT iliac fossa following RIGHT hemicolectomy invades the psoas musculature and is slowly enlarging over time, more linear on the prior study now highly concerning for recurrence/metastasis to this location. 3. Signs of enteritis, potentially post radiation changes of the small bowel. Tethered small bowel in the RIGHT lower quadrant shows focal thickening and narrowing suspicious for small bowel involvement and developing partial obstruction though currently contrast passes beyond this point into the colon. 4. Rectal thickening in this patient with known rectal mass as described. 5. No evidence of metastatic disease in the chest. 6. Stable small pulmonary nodules. 7.  and aortic atherosclerosis.   Aortic Atherosclerosis (ICD10-I70.0) and Emphysema (ICD10-J43.9).   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 Exam positive for FDG avid rectal tumor which corresponds to the recent colonoscopy findings. 2. FDG avid soft tissue mass within the right iliac fossa is noted and consistent with local tumor recurrence from previous ascending colon tumor. 3. Lateral segment left lobe of liver lesion is FDG avid concerning for liver metastasis. 4. No specific findings identified to suggest metastatic disease to the chest.   08/10/2020 -  Chemotherapy   First-line FOLFIRI q2weeks starting 08/10/20. dose reduced with cycle 1. Irinotecan/5FU increased and Bevacizumab added with cycle 2 on  08/23/2020    08/17/2020 - 03/09/2022 Chemotherapy   Patient is on Treatment Plan : COLORECTAL FOLFIRI + Bevacizumab q14d     10/27/2020 Imaging   CT CAP  IMPRESSION: 1. Interval decrease in size of the hypermetabolic left hepatic lesion, consistent with metastatic disease. No new liver lesion evident. 2. Interval resolution of the hypermetabolic soft tissue lesion along the right iliac fossa with no measurable soft tissue lesion remaining at this location today. 3. Similar appearance of soft tissue fullness in the rectum at the site of the hypermetabolic lesion seen previously. 4. Stable tiny bilateral pulmonary nodules. Continued attention on follow-up recommended. 5. Small volume free fluid in the pelvis. 6. Aortic Atherosclerosis (ICD10-I70.0).   01/22/2021 Imaging   CT CAP  IMPRESSION: CT CHEST IMPRESSION   1. Similar nonspecific pulmonary nodules. 2. New posterior left upper lobe reticulonodular opacity, suspicious for interval mild infection or inflammation. 3. No thoracic adenopathy.   CT ABDOMEN AND PELVIS IMPRESSION   1. Further decrease in size of high left hepatic lobe 3 mm low-density lesion. No new or progressive metastatic disease within the abdomen or pelvis. 2. Similar trace free pelvic fluid. 3. Similar nonspecific mid rectal wall thickening. 4.  Aortic Atherosclerosis (ICD10-I70.0).   04/23/2021 Imaging   CT CAP  IMPRESSION: 1. Treated metastatic lesion between segments 2 and 3 of the liver, slightly smaller and less distinct than prior examination. No other signs of definite metastatic disease elsewhere in the abdomen or pelvis. 2. Multiple small pulmonary nodules, stable compared to the prior examination, favored to be benign. No definitive findings to suggest metastatic disease to the thorax. 3. Aortic  atherosclerosis. 4. Additional incidental findings, as above.   08/13/2021 Imaging   EXAM: CT CHEST, ABDOMEN, AND PELVIS WITH  CONTRAST  IMPRESSION: 1. A previously seen PET avid lesion of the anterior left lobe of the liver, hepatic segment II, is no longer discretely appreciable consistent with treatment response of a hepatic metastasis. 2. No evidence of new metastatic disease in the chest, abdomen, or pelvis. 3. Interval increase in a small focus of consolidation and nodularity of the medial left upper lobe, consistent with minimal, ongoing atypical infection. Additional tiny bilateral pulmonary nodules are stable and almost certainly incidental benign. Attention on follow-up. 4. Status post right hemicolectomy and ileocolic anastomosis.   03/28/2022 - 04/05/2023 Chemotherapy   Patient is on Treatment Plan : COLORECTAL FOLFOX + Bevacizumab q14d     07/02/2022 Imaging    IMPRESSION: CHEST IMPRESSION:   1. No evidence of thoracic metastasis. 2. Stable small pulmonary nodules.   PELVIS IMPRESSION:   1. Stable to slight decrease in size of subcapsular lesion in the RIGHT hepatic lobe. 2. No evidence of new or progressive disease in the abdomen pelvis.   10/07/2022 Imaging    IMPRESSION: 1. Stable hypovascular mass in the periphery of the right lobe of the liver, which likely represents a treated metastatic lesion. Stable subcentimeter lesion just medial to this is also similar to the recent prior examination. No new hepatic lesions are otherwise noted. 2. Persistent but stable poorly defined soft tissue thickening in the right lower quadrant associated with multiple small bowel loops and the overlying iliopsoas musculature, which corresponds to focal hypermetabolism on remote prior PET-CT. Given the stability, this likely represents a treated metastatic lesion. Continued attention on follow-up studies is recommended. 3. Persistent mass-like thickening in the proximal rectum corresponding to previously noted hypermetabolic rectal neoplasm on prior PET-CT. This currently measures approximately 2.8 x  2.3 cm and is slightly more apparent than the most recent prior study. 4. Multiple small pulmonary nodules generally stable compared to the prior study, with exception of a new branching nodule in the anterior aspect of the left upper lobe measuring 7 x 3 mm (mean diameter 5 mm). This is nonspecific. Close attention on follow-up studies is recommended to ensure stability. 5. Aortic atherosclerosis. 6. Additional incidental findings, as above.     12/31/2022 Imaging    IMPRESSION: 1. Right hepatic lobe metastasis has undergone mild-to-moderate enlargement since 10/07/2022. A left hepatic lobe 5 mm lesion is not readily apparent on the prior, suspicious for a new metastasis. 2. Similar nonspecific tiny pulmonary nodules. 3. Similar amorphous soft tissue thickening within the posterior superior right hemipelvis, hypermetabolic on prior PET and indeterminate for residual disease versus scarring. 4. Similar equivocal soft tissue fullness within the rectum. 5. Increased size of an abdominopelvic ventral wall nodule for which subcutaneous metastasis are a concern. 6. New trace right pelvic fluid. 7. Incidental findings, including: Aortic Atherosclerosis (ICD10-I70.0). Possible constipation   05/08/2023 -  Chemotherapy   Patient is on Treatment Plan : COLORECTAL Bevacizumab + Trifluridine/Tipiracil q28d        CURRENT THERAPY: Third line Lonsurf/Beva  INTERVAL HISTORY Ms. Azbill returns for follow-up and treatment as scheduled, last seen by Dr. Mosetta Putt 05/22/2023 with bevacizumab.  She began cycle 2 Lonsurf on 12/2.  She remains fatigued but out of bed and active.  Has more nausea with this regimen, but no vomiting, and managed with antiemetics.  She is trying to eat better but still lost a few pounds.  She has periodic constipation  which she also manages with supportive meds and diet. Takes Lonsurf 3 tabs in the morning and 2 in the afternoon which she finds helpful.  She has occasional ache  in the right thigh with some mild weakness, no fall, able to walk and function, neuropathy is stable.  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 Some residual intraretinal fluid remains, very small localized subfoveal of fluid remains although this continues to slowly resorb. We'll continue to observe.   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   Resolved March 2021 post vitrectomy     Past Surgical History:  Procedure Laterality Date   COLONOSCOPY  2018   [facility]   COLONSCOPY  12/2015   IR IMAGING GUIDED PORT INSERTION  08/04/2020   LAPAROSCOPIC RIGHT HEMI COLECTOMY Right 02/07/2016   Procedure: LAPAROSCOPIC ASSISTED RIGHT HEMI COLECTOMY AND RIGHT SALPINGO OOPHERECTOMY;  Surgeon: Glenna Fellows, MD;  Location: [facility];  Service: General;  Laterality: Right;   RETINAL DETACHMENT SURGERY  09/2019     Outpatient Encounter Medications as of 06/05/2023  Medication Sig   ALPRAZolam (XANAX) 0.25 MG tablet Take 1 tablet (0.25 mg total) by mouth daily as needed for anxiety.   Cholecalciferol (VITAMIN D3 PO) Take by mouth daily.   docusate sodium (COLACE) 100 MG capsule 1 capsule as needed   gabapentin (NEURONTIN) 100 MG capsule Take 1 capsule (100 mg total) by mouth at bedtime.   lidocaine-prilocaine (EMLA) cream Apply 1 Application topically as needed.   LONSURF 20-8.19 MG tablet  TAKE 2 TABLETS BY MOUTH IN THE MORNING AND 3 TABLETS IN THE EVENING ON DAYS 1-5 AND DAYS 8-12 OF A 28 DAY CYCLE. TAKE WITHIN 1 HOUR AFTER MORNING AND EVENING MEALS.   NORVASC 2.5 MG tablet TAKE 1 TABLET BY MOUTH EVERY DAY   ondansetron (ZOFRAN) 8 MG tablet Take 1 tablet (8 mg total) by mouth every 8 (eight) hours as needed for nausea or vomiting.   prochlorperazine (COMPAZINE) 10 MG tablet Take 1 tablet (10 mg total) by mouth every 6 (six) hours as needed for nausea or vomiting.   Facility-Administered Encounter Medications as of 06/05/2023  Medication   0.9 %  sodium chloride infusion     Today's Vitals   06/05/23 0818  BP: 130/78  Pulse: 97  Resp: 17  Temp: 97.8 F (36.6 C)  TempSrc: Temporal  SpO2: 100%  Weight: 99 lb 8 oz (45.1 kg)  PainSc: 0-No pain   Body mass index is 16.06 kg/m.   PHYSICAL EXAM GENERAL:alert, no distress and comfortable SKIN: no rash  EYES: sclera clear NECK: without mass LYMPH:  no palpable cervical or supraclavicular lymphadenopathy  LUNGS: clear with normal breathing effort HEART: regular rate & rhythm, no lower extremity edema ABDOMEN: abdomen soft, non-tender and normal bowel sounds NEURO:  alert & oriented x 3 with fluent speech, mild RLE weakness PAC without erythema    CBC    Component Value Date/Time   WBC 2.0 (L) 06/05/2023 0746   WBC 4.9 07/02/2022 0827   RBC 3.58 (L) 06/05/2023 0746   HGB 10.2 (L) 06/05/2023 0746   HGB 11.3 (L) 05/13/2017 1252   HCT 32.2 (L) 06/05/2023 0746   HCT 36.2 05/13/2017 1252   PLT 164 06/05/2023 0746   PLT 189 05/13/2017 1252   MCV 89.9 06/05/2023 0746   MCV 87.4 05/13/2017 1252   MCH 28.5 06/05/2023 0746   MCHC 31.7 06/05/2023 0746   RDW 14.2 06/05/2023 0746   RDW 13.9 05/13/2017 1252   LYMPHSABS 0.7 06/05/2023 0746   LYMPHSABS 1.0 05/13/2017 1252   MONOABS 0.3 06/05/2023 0746   MONOABS 0.3 05/13/2017 1252   EOSABS 0.0 06/05/2023 0746   EOSABS 0.1 05/13/2017 1252   BASOSABS 0.0 06/05/2023 0746    BASOSABS 0.0 05/13/2017 1252     CMP     Component Value Date/Time   NA 135 05/22/2023 0821   NA 140 05/13/2017 1252   K 3.4 (L) 05/22/2023 0821   K 3.7 05/13/2017 1252   CL 103 05/22/2023 0821   CO2 26 05/22/2023 0821   CO2 28 05/13/2017 1252   GLUCOSE 110 (H) 05/22/2023 0821   GLUCOSE 74 05/13/2017 1252   BUN 14 05/22/2023 0821   BUN 12.5 05/13/2017 1252   CREATININE 0.66 05/22/2023 0821   CREATININE 0.66 04/24/2023 0815   CREATININE 0.9 05/13/2017 1252   CALCIUM 8.6 (L) 05/22/2023 0821   CALCIUM 9.4 05/13/2017 1252   PROT 7.1 05/22/2023 0821   PROT 7.6 05/13/2017 1252   ALBUMIN 3.5 05/22/2023 0821   ALBUMIN 4.1 05/13/2017 1252   AST 18 05/22/2023 0821   AST 21 04/24/2023 0815   AST 19 05/13/2017 1252   ALT 7 05/22/2023 0821   ALT 10 04/24/2023 0815   ALT 10 05/13/2017 1252   ALKPHOS 93 05/22/2023 0821   ALKPHOS 63 05/13/2017 1252   BILITOT 0.4 05/22/2023 0821   BILITOT 0.4 04/24/2023 0815   BILITOT 0.47 05/13/2017 1252   GFRNONAA >60 05/22/2023 0821   GFRNONAA >60 04/24/2023 0815   GFRAA >60 08/18/2019 0905     ASSESSMENT & PLAN:Devine M Turnier is a 72 y.o. female with   1. Rectal adenocarcinoma, with liver and right pelvic metastasis, recurrence from previous colon cancer vs new primary   -Diagnosed on routine screening colonoscopy 05/2020, rectal mass biopsy showed invasive adenocarcinoma, arising from a tubular adenoma -Her 07/12/20 PET showed positive uptake of known rectal tumor and soft tissue mass within the right iliac fossa indicating recurrent prior colon cancer. There is also left lobe of liver lesion is FDG avid concerning for liver metastasis. pt declined liver biopsy -whether this is metastatic rectal or recurrent/metastatic colon cancer, this is likely not curable, but still treatable. She previously declined referral for HIPEC -She progressed on first-line FOLFIRI/Beva -She switched to FOLFOX and continued Beva starting 03/28/2022.  Due to  stable disease and neuropathy, oxaliplatin was stopped 09/2022 -Restaging CT 12/30/2022 showed cancer progression in the liver, and rising CEA consistent with disease progression -She resumed dose-reduced FOLFOX/Beva 01/09/2023 with G-CSF -Restaging CT 04/14/2023 unfortunately showed disease progression in the liver and she switched to third line chemotherapy with Lonsurf (days 1-5 and 8-12 q28 days) plus bevacizumab q14 days, starting in 05/2023 -Ms. Student appears stable.  Tolerating Lonsurf/Beva with mild fatigue, nausea, and constipation.   -  Side effects are adequately managed with supportive care at home.  She is able to recover and function well and maintain adequate performance status.  There is no clinical evidence of disease progression -She has mild discomfort and weakness in the right upper leg/hip, I reviewed her last CT which shows soft tissue thickening in the pelvis near the iliopsoas muscle, likely the cause.  She can take Tylenol as needed which is effective.  I cautioned her to ambulate and change positions carefully, she has avoided fall -Hopefully this improves on treatment, if not will review with rad onc to see if she is a candidate for palliative RT if symptoms worsen -Labs reviewed, ANC 1.1, otherwise adequate to continue Lonsurf (currently C2 D4) and proceed with Beva today as scheduled -F/up in 2 weeks with next Beva   2. Cancer of ascending colon, [id number], stage IIIC, MSI-stable, (+) surgical margins at pelvic wall     -She was diagnosed in 12/2015. She is s/p right hemicolectomy with right salpingo oophorectomy and  adjuvant ChemoRT. -she declined adjuvant chemo due to the concern of side effects and impact on her quality of life. -Her 04/2019 CT scan was NED  -With rectal cancer diagnosis, her PET from 07/12/20 indicates soft tissue mass within the right iliac fossa is noted and consistent with local tumor recurrence from previous ascending colon tumor.  Subsequent imaging  showed this resolved after beginning treatment -She declined option of liver biopsy.     3. HTN, Anxiety -She'll follow-up with her primary care physician and continue medication -She uses half tab Xanax as needed, mostly only on treatment days. refilled -stable, refilled      PLAN: -Labs reviewed, ANC 1.1; OK to treat -Continue Lonsurf (C2 D4) and proceed with Beva today as scheduled -Discussed R leg discomfort/weakness and symptom management, monitor  -F/up and next Beva in 2 weeks as scheduled       All questions were answered. The patient knows to call the clinic with any problems, questions or concerns. No barriers to learning were detected. I spent 20 minutes counseling the patient face to face. The total time spent in the appointment was 30 minutes and more than 50% was on counseling, review of test results, and coordination of care.   Santiago Glad, NP-C 06/05/2023

## 2023-06-05 NOTE — Progress Notes (Signed)
Ok to treat with ANC 1.1 per Santiago Glad.

## 2023-06-07 ENCOUNTER — Other Ambulatory Visit: Payer: Self-pay

## 2023-06-19 ENCOUNTER — Telehealth: Payer: Self-pay

## 2023-06-19 ENCOUNTER — Inpatient Hospital Stay: Payer: Federal, State, Local not specified - PPO

## 2023-06-19 ENCOUNTER — Inpatient Hospital Stay (HOSPITAL_BASED_OUTPATIENT_CLINIC_OR_DEPARTMENT_OTHER): Payer: Federal, State, Local not specified - PPO | Admitting: Nurse Practitioner

## 2023-06-19 VITALS — BP 141/78 | HR 103 | Temp 97.6°F | Resp 15 | Wt 94.4 lb

## 2023-06-19 VITALS — BP 131/80 | HR 98 | Resp 16

## 2023-06-19 DIAGNOSIS — C787 Secondary malignant neoplasm of liver and intrahepatic bile duct: Secondary | ICD-10-CM

## 2023-06-19 DIAGNOSIS — Z5112 Encounter for antineoplastic immunotherapy: Secondary | ICD-10-CM | POA: Diagnosis not present

## 2023-06-19 DIAGNOSIS — C20 Malignant neoplasm of rectum: Secondary | ICD-10-CM

## 2023-06-19 DIAGNOSIS — Z95828 Presence of other vascular implants and grafts: Secondary | ICD-10-CM

## 2023-06-19 DIAGNOSIS — D500 Iron deficiency anemia secondary to blood loss (chronic): Secondary | ICD-10-CM

## 2023-06-19 LAB — CBC WITH DIFFERENTIAL (CANCER CENTER ONLY)
Abs Immature Granulocytes: 0.01 10*3/uL (ref 0.00–0.07)
Basophils Absolute: 0 10*3/uL (ref 0.0–0.1)
Basophils Relative: 0 %
Eosinophils Absolute: 0 10*3/uL (ref 0.0–0.5)
Eosinophils Relative: 0 %
HCT: 26.5 % — ABNORMAL LOW (ref 36.0–46.0)
Hemoglobin: 8.7 g/dL — ABNORMAL LOW (ref 12.0–15.0)
Immature Granulocytes: 1 %
Lymphocytes Relative: 56 %
Lymphs Abs: 0.5 10*3/uL — ABNORMAL LOW (ref 0.7–4.0)
MCH: 28.5 pg (ref 26.0–34.0)
MCHC: 32.8 g/dL (ref 30.0–36.0)
MCV: 86.9 fL (ref 80.0–100.0)
Monocytes Absolute: 0.1 10*3/uL (ref 0.1–1.0)
Monocytes Relative: 7 %
Neutro Abs: 0.3 10*3/uL — CL (ref 1.7–7.7)
Neutrophils Relative %: 36 %
Platelet Count: 81 10*3/uL — ABNORMAL LOW (ref 150–400)
RBC: 3.05 MIL/uL — ABNORMAL LOW (ref 3.87–5.11)
RDW: 13.4 % (ref 11.5–15.5)
Smear Review: NORMAL
WBC Count: 1 10*3/uL — ABNORMAL LOW (ref 4.0–10.5)
nRBC: 0 % (ref 0.0–0.2)

## 2023-06-19 LAB — COMPREHENSIVE METABOLIC PANEL
ALT: 6 U/L (ref 0–44)
AST: 13 U/L — ABNORMAL LOW (ref 15–41)
Albumin: 3.4 g/dL — ABNORMAL LOW (ref 3.5–5.0)
Alkaline Phosphatase: 79 U/L (ref 38–126)
Anion gap: 8 (ref 5–15)
BUN: 12 mg/dL (ref 8–23)
CO2: 27 mmol/L (ref 22–32)
Calcium: 8.8 mg/dL — ABNORMAL LOW (ref 8.9–10.3)
Chloride: 99 mmol/L (ref 98–111)
Creatinine, Ser: 0.61 mg/dL (ref 0.44–1.00)
GFR, Estimated: >60 mL/min (ref >60)
Glucose, Bld: 91 mg/dL (ref 70–99)
Potassium: 3.5 mmol/L (ref 3.5–5.1)
Sodium: 134 mmol/L — ABNORMAL LOW (ref 135–145)
Total Bilirubin: 0.6 mg/dL (ref <1.2)
Total Protein: 7 g/dL (ref 6.5–8.1)

## 2023-06-19 LAB — FERRITIN: Ferritin: 234 ng/mL (ref 11–307)

## 2023-06-19 LAB — CEA (IN HOUSE-CHCC): CEA (CHCC-In House): 272.93 ng/mL — ABNORMAL HIGH (ref 0.00–5.00)

## 2023-06-19 MED ORDER — SODIUM CHLORIDE 0.9 % IV SOLN: INTRAVENOUS | Status: DC

## 2023-06-19 MED ORDER — SODIUM CHLORIDE 0.9% FLUSH
10.0000 mL | INTRAVENOUS | Status: DC | PRN
  Administered 2023-06-19: 10 mL

## 2023-06-19 MED ORDER — SODIUM CHLORIDE 0.9 % IV SOLN
200.0000 mg | Freq: Once | INTRAVENOUS | Status: AC
  Administered 2023-06-19: 200 mg via INTRAVENOUS
  Filled 2023-06-19: qty 8

## 2023-06-19 MED ORDER — SODIUM CHLORIDE 0.9% FLUSH
10.0000 mL | Freq: Once | INTRAVENOUS | Status: AC
  Administered 2023-06-19: 10 mL

## 2023-06-19 MED ORDER — HEPARIN SOD (PORK) LOCK FLUSH 100 UNIT/ML IV SOLN
500.0000 [IU] | Freq: Once | INTRAVENOUS | Status: AC | PRN
Start: 2023-06-19 — End: 2023-06-19
  Administered 2023-06-19: 500 [IU]

## 2023-06-19 NOTE — Telephone Encounter (Signed)
Critical lab value Reported: ANC 0.3  Notified Vincent Gros, NP and her nurse Elease Etienne.

## 2023-06-19 NOTE — Patient Instructions (Signed)
[facility] - A DEPT OF [facility]   Discharge Instructions: Thank you for choosing [facility] to provide your oncology and hematology care.   If you have a lab appointment with the Cancer Center, please go directly to the Cancer Center and check in at the registration area.   Wear comfortable clothing and clothing appropriate for easy access to any Portacath or PICC line.   We strive to give you quality time with your provider. You may need to reschedule your appointment if you arrive late (15 or more minutes).  Arriving late affects you and other patients whose appointments are after yours.  Also, if you miss three or more appointments without notifying the office, you may be dismissed from the clinic at the provider's discretion.      For prescription refill requests, have your pharmacy contact our office and allow 72 hours for refills to be completed.    Today you received the following chemotherapy and/or immunotherapy agents: Bevacizumab (Mvasi)      To help prevent nausea and vomiting after your treatment, we encourage you to take your nausea medication as directed.  BELOW ARE SYMPTOMS THAT SHOULD BE REPORTED IMMEDIATELY: *FEVER GREATER THAN 100.4 F (38 C) OR HIGHER *CHILLS OR SWEATING *NAUSEA AND VOMITING THAT IS NOT CONTROLLED WITH YOUR NAUSEA MEDICATION *UNUSUAL SHORTNESS OF BREATH *UNUSUAL BRUISING OR BLEEDING *URINARY PROBLEMS (pain or burning when urinating, or frequent urination) *BOWEL PROBLEMS (unusual diarrhea, constipation, pain near the anus) TENDERNESS IN MOUTH AND THROAT WITH OR WITHOUT PRESENCE OF ULCERS (sore throat, sores in mouth, or a toothache) UNUSUAL RASH, SWELLING OR PAIN  UNUSUAL VAGINAL DISCHARGE OR ITCHING   Items with * indicate a potential emergency and should be followed up as soon as possible or go to the Emergency Department if any problems should occur.  Please show the CHEMOTHERAPY ALERT CARD or  IMMUNOTHERAPY ALERT CARD at check-in to the Emergency Department and triage nurse.  Should you have questions after your visit or need to cancel or reschedule your appointment, please contact [facility] - A DEPT OF [facility]  Dept: [phone]  and follow the prompts.  Office hours are 8:00 a.m. to 4:30 p.m. Monday - Friday. Please note that voicemails left after 4:00 p.m. may not be returned until the following business day.  We are closed weekends and major holidays. You have access to a nurse at all times for urgent questions. Please call the main number to the clinic Dept: [phone] and follow the prompts.   For any non-urgent questions, you may also contact your provider using MyChart. We now offer e-Visits for anyone 78 and older to request care online for non-urgent symptoms. For details visit [email or web address].   Also download the MyChart app! Go to the app store, search "MyChart", open the app, select [facility], and log in with your MyChart username and password.

## 2023-06-19 NOTE — Assessment & Plan Note (Addendum)
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  -She began first-line chemo with dose-reduced FOLFIRI and Beva in 2/22.  -we changed to FOLFOX with continued Beva on 03/28/22. She tolerated well overall with some fatigue and cold sensitivity. -her restaging CT from 10/07/2022 was reviewed, which showed overall SD in liver and rectum, stable and indermininate lung nodules, no other new lesions  -She is tolerating chemotherapy overall well, will continue. -her restaging CT scan from October 07, 2022 was reviewed, which showed overall stable disease.  She has multiple small lung nodules, indeterminate, including one 7 mm nodule, will continue monitoring. 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 -Restaging CT scan on April 14, 2023 unfortunately showed disease progression in liver. -third line chemotherapy options were discussed. Lonsurf and bevacizumab was recommended. She started this treatment regimen on 06/03/2023.  -will request N[facility] on her rectal tumor biopsy from 2021 -Today, 06/19/2023, she will have cycle 2 day 15 bevacizumab.

## 2023-06-19 NOTE — Progress Notes (Signed)
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  -Brittney Spencer began first-line chemo with dose-reduced FOLFIRI and Beva in 2/22.  -we changed to FOLFOX with continued Beva on 03/28/22. Brittney Spencer tolerated well overall with some fatigue and cold sensitivity. -I personally reviewed her restaging CT from 10/07/2022 which showed overall SD in liver and rectum, stable and indermininate lung nodules, no other new lesions  -Brittney Spencer is tolerating chemotherapy overall well, will continue. -I personally reviewed her restaging CT scan from October 07, 2022, which showed overall stable disease.  Brittney Spencer has multiple small lung nodules, indeterminate, including one 7 mm nodule, will continue monitoring. 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 -Restaging CT scan on April 14, 2023 unfortunately showed disease progression in liver. -I discussed the third line chemotherapy options, I recommend Lonsurf and bevacizumab -will request NGS [facility] on her rectal tumor biopsy in 2021   Plan: Labs reviewed  -CBC showing WBC 1.0; Hgb 8.7; Hct 26.5; Plt 81; Anc 0.3 -CMP - Na 134; K 3.5; glucose 91; BUN 12; Creatinine 0.61; eGFR > 60; Ca 8.8; LFTs normal.   -Ferritin -234 -CEA  -272.93 Discussed labs with Brittney Spencer, specifically, patient's white blood cell count of 1.0 and ANC of 0.3.  Okay to treat with bevacizumab today.  Currently off Lonsurf until after the new year. Patient understands strict infection precautions.  Practices good hand hygiene.  Will wear mask if in public or around anyone who is sick.  Brittney Spencer also understands return protocol for new or worrisome symptoms. Follow-up with labs as scheduled on 07/03/2023 with Dr. Mosetta Spencer.   The patient understands the plans discussed today and is in agreement with them.  Brittney Spencer knows to contact our office if Brittney Spencer develops concerns prior to her next appointment.  I provided 30 minutes of face-to-face time during this encounter and > 50% was spent counseling as documented under my assessment and plan.    Brittney Spencer  [facility] - A DEPT OF [facility] [address] [place] [address] Dept: [phone] Dept Fax: [phone]   No orders of the defined types were placed in this encounter.     CHIEF COMPLAINT:  CC: metastatic colorectal cancer  Current Treatment:  lonsurf with bevbacizumab.   INTERVAL HISTORY:  Brittney Spencer is here today for repeat clinical assessment. Last saw Brittney Spencer on 06/05/2023.  Brittney Spencer started Lonsurf on 06/03/2023.  Currently on "off" cycle.  Due for bevacizumab today.  Brittney Spencer reports decreased appetite.  Brittney Spencer is having constipation, which is moderate to severe.  Taking Colace for this does help.  Brittney Spencer does have some nausea.  Brittney Spencer does have prescription nausea medications which Brittney Spencer can take as needed.  Antinausea medicines do help. Brittney Spencer denies  chest pain, chest pressure, or shortness of breath. Brittney Spencer denies headaches or visual disturbances. Brittney Spencer denies fevers or chills. Her weight has decreased 5 pounds over last 2 .  Patient states that appetite has started to improve since on "break" from [place].  Brittney Spencer is trying to have more protein  and calorie intake.  I have reviewed the past medical history, past surgical history, social history and family history with the patient and they are unchanged from previous note.  ALLERGIES:  is allergic to venofer [iron sucrose], fish allergy, peanut-containing drug products, soy allergy (do not select), and buspirone.  MEDICATIONS:  Current Outpatient Medications  Medication Sig Dispense Refill   ALPRAZolam (XANAX) 0.25 MG tablet Take 1 tablet (0.25 mg total) by mouth daily as needed for anxiety. 30 tablet 0   Cholecalciferol (VITAMIN D3 PO) Take by mouth daily.     docusate sodium (COLACE) 100 MG capsule 1 capsule as needed     gabapentin (NEURONTIN) 100 MG capsule Take 1 capsule (100 mg total) by mouth at bedtime. 30 capsule 1   lidocaine-prilocaine (EMLA) cream Apply 1 Application topically as needed. 30 g 1   LONSURF 20-8.19 MG tablet TAKE 2 TABLETS BY MOUTH IN THE MORNING AND 3 TABLETS IN THE EVENING ON DAYS 1-5 AND DAYS 8-12 OF A 28 DAY CYCLE. TAKE WITHIN 1 HOUR AFTER MORNING AND EVENING MEALS. 60 tablet 1   NORVASC 2.5 MG tablet TAKE 1 TABLET BY MOUTH EVERY DAY 90 tablet 1   ondansetron (ZOFRAN) 8 MG tablet Take 1 tablet (8 mg total) by mouth every 8 (eight) hours as needed for nausea or vomiting. 20 tablet 2   prochlorperazine (COMPAZINE) 10 MG tablet Take 1 tablet (10 mg total) by mouth every 6 (six) hours as needed for nausea or vomiting. 30 tablet 2   Current Facility-Administered Medications  Medication Dose Route Frequency Provider Last Rate Last Admin   0.9 %  sodium chloride infusion  500 mL Intravenous Once Brittney Rist, MD        HISTORY OF PRESENT ILLNESS:   Oncology History Overview Note  Cancer Staging Cancer of ascending colon [facility]) Staging form: Colon and Rectum, AJCC 7th Edition - Clinical stage from 02/07/2016: Stage IIIC (T4b, N1b, M0) - Signed by Brittney Mood, MD on 03/04/2016 Laterality: Right Residual tumor (R): R2 - Macroscopic    Cancer of ascending  colon (HCC)  10/25/2015 Imaging   CT ABD/PELVIS:  Inflammatory changes inferior to the cecal tip appear improved, there is still irregular soft tissue thickening of the cecal tip, and there are adjacent prominent lymph nodes in the ileocolonic mesentery, measuring 13 mm on image 49 and 8 mm on image 52. In addition, there is a 2.5 x 1.8 cm nodule on image 46 which has central low density. Therefore, these findings are moderately suspicious for an underlying cecal malignancy with perforation.    01/19/2016 Procedure   COLONOSCOPY per Dr. [name]: Fungating, ulcerated mass almost obstructing mid ascending colon   01/19/2016 Initial Biopsy   Diagnosis Surgical [P], cecal mass - INVASIVE ADENOCARCINOMA WITH ULCERATION. - SEE COMMENT.   02/05/2016 Tumor Marker   Patient's tumor was tested for the following markers: CEA Results of the tumor marker test revealed 5.7.   02/07/2016 Initial Diagnosis   Cancer of ascending colon (HCC)   02/07/2016 Definitive Surgery   Laparoscopic assisted right hemicolectomy and right salpingo oopherectomy--[name]   02/07/2016 Pathologic Stage   p T4 N1b   2/43 nodes +  02/07/2016 Pathology Results   MMR normal; G2 adenocarcinoma;proximal & distal margins negative; soft tissue mass on pelvic sidewall + for adenocarcinoma with positive margin MSI Stable   03/08/2016 Imaging   CT chest negative for metastasis.    03/19/2016 - 04/25/2016 Radiation Therapy   Adjuvant irradiation, 50 gray in 28 fractions   03/19/2016 - 04/22/2016 Chemotherapy   Xeloda 1500 mg twice daily, started on 03/19/2016, dose reduced to 1000 mg twice daily from week 3 due to neutropenia, and patient stopped 3 days before last dose radiation due to difficulty swallowing the pill    05/20/2016 -  Adjuvant Chemotherapy   Patient declined adjuvant chemotherapy   09/16/2016 Imaging   CT CAP w Contrast 1. No evidence of local tumor recurrence at the ileocolic anastomosis. 2. No findings  suspicious for metastatic disease in the chest, abdomen or pelvis. 3. Nonspecific trace free fluid in the pelvic cul-de-sac. 4. Stable solitary 3 mm right upper lobe pulmonary nodule, for which 6 month stability has been demonstrated, probably benign. 5. Additional findings include stable right posterior pericardial cyst and small calcified uterine fibroids.   05/13/2017 Imaging   CT CAP W Contrast 05/13/17 IMPRESSION: 1. No current findings of residual or recurrent malignancy. 2. Mild prominence of stool throughout the colon. Nondistended portions of the rectum. 3. Several tiny pulmonary nodules are stable from the earliest available comparison of 03/08/2016 and probably benign, but may merit surveillance. 4. Other imaging findings of potential clinical significance: Old granulomatous disease. Aortoiliac atherosclerotic vascular disease. Lumbar spondylosis and degenerative disc disease. Stable amount of trace free pelvic fluid.   04/27/2018 Imaging   04/27/2018 CT CAP IMPRESSION: Stable exam. No evidence of recurrent or metastatic carcinoma within the chest, abdomen, or pelvis   04/19/2019 Imaging   CT CAP W Contrast  IMPRESSION: Chest Impression:   1. No evidence of thoracic metastasis. 2. Stable small bilateral pulmonary nodules.   Abdomen / Pelvis Impression:   1. No evidence local colorectal carcinoma recurrence or metastasis in the abdomen pelvis. 2. Post RIGHT hemicolectomy.   01/22/2021 Imaging   CT CAP  IMPRESSION: CT CHEST IMPRESSION   1. Similar nonspecific pulmonary nodules. 2. New posterior left upper lobe reticulonodular opacity, suspicious for interval mild infection or inflammation. 3. No thoracic adenopathy.   CT ABDOMEN AND PELVIS IMPRESSION   1. Further decrease in size of high left hepatic lobe 3 mm low-density lesion. No new or progressive metastatic disease within the abdomen or pelvis. 2. Similar trace free pelvic fluid. 3. Similar  nonspecific mid rectal wall thickening. 4.  Aortic Atherosclerosis (ICD10-I70.0).   04/23/2021 Imaging   CT CAP  IMPRESSION: 1. Treated metastatic lesion between segments 2 and 3 of the liver, slightly smaller and less distinct than prior examination. No other signs of definite metastatic disease elsewhere in the abdomen or pelvis. 2. Multiple small pulmonary nodules, stable compared to the prior examination, favored to be benign. No definitive findings to suggest metastatic disease to the thorax. 3. Aortic atherosclerosis. 4. Additional incidental findings, as above.   08/13/2021 Imaging   EXAM: CT CHEST, ABDOMEN, AND PELVIS WITH CONTRAST  IMPRESSION: 1. A previously seen PET avid lesion of the anterior left lobe of the liver, hepatic segment II, is no longer discretely appreciable consistent with treatment response of a hepatic metastasis. 2. No evidence of new metastatic disease in the chest, abdomen, or pelvis. 3. Interval increase in a small focus of consolidation and nodularity of the medial left upper lobe, consistent with  minimal, ongoing atypical infection. Additional tiny bilateral pulmonary nodules are stable and almost certainly incidental benign. Attention on follow-up. 4. Status post right hemicolectomy and ileocolic anastomosis.   07/02/2022 Imaging    IMPRESSION: CHEST IMPRESSION:   1. No evidence of thoracic metastasis. 2. Stable small pulmonary nodules.   PELVIS IMPRESSION:   1. Stable to slight decrease in size of subcapsular lesion in the RIGHT hepatic lobe. 2. No evidence of new or progressive disease in the abdomen pelvis. 3.  Aortic Atherosclerosis (ICD10-I70.0).     12/31/2022 Imaging    IMPRESSION: 1. Right hepatic lobe metastasis has undergone mild-to-moderate enlargement since 10/07/2022. A left hepatic lobe 5 mm lesion is not readily apparent on the prior, suspicious for a new metastasis. 2. Similar nonspecific tiny pulmonary nodules. 3.  Similar amorphous soft tissue thickening within the posterior superior right hemipelvis, hypermetabolic on prior PET and indeterminate for residual disease versus scarring. 4. Similar equivocal soft tissue fullness within the rectum. 5. Increased size of an abdominopelvic ventral wall nodule for which subcutaneous metastasis are a concern. 6. New trace right pelvic fluid. 7. Incidental findings, including: Aortic Atherosclerosis (ICD10-I70.0). 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 - Patent side-to-side ileo-colonic anastomosis, characterized by healthy appearing mucosa. - The examined portion of the ileum was normal. - One diminutive polyp in the proximal transverse colon, removed with a cold biopsy forceps. Resected and retrieved. - Likely malignant partially obstructing tumor in the mid rectum. Biopsied. Tattooed. - The examination was otherwise normal on direct and retroflexion views.   06/15/2020 Initial Biopsy   Diagnosis 1. Transverse Colon Polyp - HYPERPLASTIC POLYP 2. Rectum, biopsy - ADENOCARCINOMA ARISING IN A TUBULAR ADENOMA WITH HIGH-GRADE DYSPLASIA. SEE NOTE Diagnosis Note 2. Dr. Luisa Hart reviewed the case and concurs with the diagnosis. Dr. [name] was notified on 06/16/2020.   06/28/2020 Imaging   CT CAP  IMPRESSION: 1. New low-density focus in the anterior aspect of the lateral segment LEFT hepatic lobe measuring 1.2 x 1.0 cm, compatible with small metastatic lesion in the LEFT hepatic lobe. 2. Soft tissue in the RIGHT iliac fossa following RIGHT hemicolectomy invades the psoas musculature and is slowly enlarging over time, more linear on the prior study now highly concerning  for recurrence/metastasis to this location. 3. Signs of enteritis, potentially post radiation changes of the small bowel. Tethered small bowel in the RIGHT lower quadrant shows focal thickening and narrowing suspicious for small bowel involvement and developing partial obstruction though currently contrast passes beyond this point into the colon. 4. Rectal thickening in this patient with known rectal mass as described. 5. No evidence of metastatic disease in the chest. 6. Stable small pulmonary nodules. 7.  and aortic atherosclerosis.   Aortic Atherosclerosis (ICD10-I70.0) and Emphysema (ICD10-J43.9).   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 Exam positive for FDG avid rectal tumor which corresponds to the recent colonoscopy findings. 2. FDG avid soft tissue mass within the right iliac fossa is noted and consistent with local tumor recurrence from previous ascending colon tumor. 3. Lateral segment left lobe of liver lesion is FDG avid concerning for liver metastasis. 4. No specific findings identified to suggest metastatic disease to the chest.  08/10/2020 -  Chemotherapy   First-line FOLFIRI q2weeks starting 08/10/20. dose reduced with cycle 1. Irinotecan/5FU increased and Bevacizumab added with cycle 2 on 08/23/2020    08/17/2020 - 03/09/2022 Chemotherapy   Patient is on Treatment Plan : COLORECTAL FOLFIRI + Bevacizumab q14d     10/27/2020 Imaging   CT CAP  IMPRESSION: 1. Interval decrease in size of the hypermetabolic left hepatic lesion, consistent with metastatic disease. No new liver lesion evident. 2. Interval resolution of the hypermetabolic soft tissue lesion along the right iliac fossa with no measurable soft tissue lesion remaining at this location today. 3. Similar appearance of soft tissue fullness in the rectum at the site of the hypermetabolic lesion seen previously. 4. Stable tiny bilateral  pulmonary nodules. Continued attention on follow-up recommended. 5. Small volume free fluid in the pelvis. 6. Aortic Atherosclerosis (ICD10-I70.0).   01/22/2021 Imaging   CT CAP  IMPRESSION: CT CHEST IMPRESSION   1. Similar nonspecific pulmonary nodules. 2. New posterior left upper lobe reticulonodular opacity, suspicious for interval mild infection or inflammation. 3. No thoracic adenopathy.   CT ABDOMEN AND PELVIS IMPRESSION   1. Further decrease in size of high left hepatic lobe 3 mm low-density lesion. No new or progressive metastatic disease within the abdomen or pelvis. 2. Similar trace free pelvic fluid. 3. Similar nonspecific mid rectal wall thickening. 4.  Aortic Atherosclerosis (ICD10-I70.0).   04/23/2021 Imaging   CT CAP  IMPRESSION: 1. Treated metastatic lesion between segments 2 and 3 of the liver, slightly smaller and less distinct than prior examination. No other signs of definite metastatic disease elsewhere in the abdomen or pelvis. 2. Multiple small pulmonary nodules, stable compared to the prior examination, favored to be benign. No definitive findings to suggest metastatic disease to the thorax. 3. Aortic atherosclerosis. 4. Additional incidental findings, as above.   08/13/2021 Imaging   EXAM: CT CHEST, ABDOMEN, AND PELVIS WITH CONTRAST  IMPRESSION: 1. A previously seen PET avid lesion of the anterior left lobe of the liver, hepatic segment II, is no longer discretely appreciable consistent with treatment response of a hepatic metastasis. 2. No evidence of new metastatic disease in the chest, abdomen, or pelvis. 3. Interval increase in a small focus of consolidation and nodularity of the medial left upper lobe, consistent with minimal, ongoing atypical infection. Additional tiny bilateral pulmonary nodules are stable and almost certainly incidental benign. Attention on follow-up. 4. Status post right hemicolectomy and ileocolic anastomosis.    03/28/2022 - 04/05/2023 Chemotherapy   Patient is on Treatment Plan : COLORECTAL FOLFOX + Bevacizumab q14d     07/02/2022 Imaging    IMPRESSION: CHEST IMPRESSION:   1. No evidence of thoracic metastasis. 2. Stable small pulmonary nodules.   PELVIS IMPRESSION:   1. Stable to slight decrease in size of subcapsular lesion in the RIGHT hepatic lobe. 2. No evidence of new or progressive disease in the abdomen pelvis.   10/07/2022 Imaging    IMPRESSION: 1. Stable hypovascular mass in the periphery of the right lobe of the liver, which likely represents a treated metastatic lesion. Stable subcentimeter lesion just medial to this is also similar to the recent prior examination. No new hepatic lesions are otherwise noted. 2. Persistent but stable poorly defined soft tissue thickening in the right lower quadrant associated with multiple small bowel loops and the overlying iliopsoas musculature, which corresponds to focal hypermetabolism on remote prior PET-CT. Given the stability, this likely represents a treated metastatic lesion. Continued attention on follow-up studies is recommended.  3. Persistent mass-like thickening in the proximal rectum corresponding to previously noted hypermetabolic rectal neoplasm on prior PET-CT. This currently measures approximately 2.8 x 2.3 cm and is slightly more apparent than the most recent prior study. 4. Multiple small pulmonary nodules generally stable compared to the prior study, with exception of a new branching nodule in the anterior aspect of the left upper lobe measuring 7 x 3 mm (mean diameter 5 mm). This is nonspecific. Close attention on follow-up studies is recommended to ensure stability. 5. Aortic atherosclerosis. 6. Additional incidental findings, as above.     12/31/2022 Imaging    IMPRESSION: 1. Right hepatic lobe metastasis has undergone mild-to-moderate enlargement since 10/07/2022. A left hepatic lobe 5 mm lesion is not readily  apparent on the prior, suspicious for a new metastasis. 2. Similar nonspecific tiny pulmonary nodules. 3. Similar amorphous soft tissue thickening within the posterior superior right hemipelvis, hypermetabolic on prior PET and indeterminate for residual disease versus scarring. 4. Similar equivocal soft tissue fullness within the rectum. 5. Increased size of an abdominopelvic ventral wall nodule for which subcutaneous metastasis are a concern. 6. New trace right pelvic fluid. 7. Incidental findings, including: Aortic Atherosclerosis (ICD10-I70.0). Possible constipation   05/08/2023 -  Chemotherapy   Patient is on Treatment Plan : COLORECTAL Bevacizumab + Trifluridine/Tipiracil q28d         REVIEW OF SYSTEMS:   Constitutional: Denies fevers or chills.  Has decreased appetite.  Has lost 5 pounds since last visit 2 weeks ago. Eyes: Denies blurriness of vision Ears, nose, mouth, throat, and face: Denies mucositis or sore throat Respiratory: Denies cough, dyspnea or wheezes Cardiovascular: Denies palpitation, chest discomfort or lower extremity swelling Gastrointestinal: Having moderate constipation which is leading her to have some nausea.  Taking Colace and antinausea medications help symptoms. Skin: Denies abnormal skin rashes Lymphatics: Denies new lymphadenopathy or easy bruising Neurological:Denies numbness, tingling or new weaknesses Behavioral/Psych: Spencer is stable, no new changes  All other systems were reviewed with the patient and are negative.   VITALS:   Today's Vitals   06/19/23 1002  BP: (!) 141/78  Pulse: (!) 103  Resp: 15  Temp: 97.6 F (36.4 C)  TempSrc: Temporal  SpO2: 98%  Weight: 94 lb 6.4 oz (42.8 kg)   Body mass index is 15.24 kg/m.   Wt Readings from Last 3 Encounters:  06/19/23 94 lb 6.4 oz (42.8 kg)  06/05/23 99 lb 8 oz (45.1 kg)  05/22/23 102 lb 9.6 oz (46.5 kg)    Body mass index is 15.24 kg/m.  Performance status (ECOG): 1 - Symptomatic  but completely ambulatory  PHYSICAL EXAM:   GENERAL:alert, no distress and comfortable SKIN: skin color, texture, turgor are normal, no rashes or significant lesions EYES: normal, Conjunctiva are pink and non-injected, sclera clear OROPHARYNX:no exudate, no erythema and lips, buccal mucosa, and tongue normal  NECK: supple, thyroid normal size, non-tender, without nodularity LYMPH:  no palpable lymphadenopathy in the cervical, axillary or inguinal LUNGS: clear to auscultation and percussion with normal breathing effort HEART: regular rate & rhythm and no murmurs and no lower extremity edema ABDOMEN:abdomen soft, non-tender and normal bowel sounds Musculoskeletal:no cyanosis of digits and no clubbing  NEURO: alert & oriented x 3 with fluent speech, no focal motor/sensory deficits  LABORATORY DATA:  I have reviewed the data as listed    Component Value Date/Time   NA 134 (L) 06/19/2023 0920   NA 140 05/13/2017 1252   K 3.5 06/19/2023 0920   K  3.7 05/13/2017 1252   CL 99 06/19/2023 0920   CO2 27 06/19/2023 0920   CO2 28 05/13/2017 1252   GLUCOSE 91 06/19/2023 0920   GLUCOSE 74 05/13/2017 1252   BUN 12 06/19/2023 0920   BUN 12.5 05/13/2017 1252   CREATININE 0.61 06/19/2023 0920   CREATININE 0.66 04/24/2023 0815   CREATININE 0.9 05/13/2017 1252   CALCIUM 8.8 (L) 06/19/2023 0920   CALCIUM 9.4 05/13/2017 1252   PROT 7.0 06/19/2023 0920   PROT 7.6 05/13/2017 1252   ALBUMIN 3.4 (L) 06/19/2023 0920   ALBUMIN 4.1 05/13/2017 1252   AST 13 (L) 06/19/2023 0920   AST 21 04/24/2023 0815   AST 19 05/13/2017 1252   ALT 6 06/19/2023 0920   ALT 10 04/24/2023 0815   ALT 10 05/13/2017 1252   ALKPHOS 79 06/19/2023 0920   ALKPHOS 63 05/13/2017 1252   BILITOT 0.6 06/19/2023 0920   BILITOT 0.4 04/24/2023 0815   BILITOT 0.47 05/13/2017 1252   GFRNONAA >60 06/19/2023 0920   GFRNONAA >60 04/24/2023 0815   GFRAA >60 08/18/2019 0905   Lab Results  Component Value Date   WBC 1.0 (L)  06/19/2023   NEUTROABS 0.3 (LL) 06/19/2023   HGB 8.7 (L) 06/19/2023   HCT 26.5 (L) 06/19/2023   MCV 86.9 06/19/2023   PLT 81 (L) 06/19/2023

## 2023-06-27 ENCOUNTER — Encounter: Payer: Self-pay | Admitting: Hematology

## 2023-06-27 ENCOUNTER — Telehealth: Payer: Self-pay

## 2023-06-27 ENCOUNTER — Other Ambulatory Visit: Payer: Self-pay

## 2023-06-27 ENCOUNTER — Telehealth: Payer: Self-pay | Admitting: Hematology

## 2023-06-27 ENCOUNTER — Encounter: Payer: Self-pay | Admitting: Nurse Practitioner

## 2023-06-27 DIAGNOSIS — C182 Malignant neoplasm of ascending colon: Secondary | ICD-10-CM

## 2023-06-27 DIAGNOSIS — Z66 Do not resuscitate: Secondary | ICD-10-CM

## 2023-06-27 DIAGNOSIS — C20 Malignant neoplasm of rectum: Secondary | ICD-10-CM

## 2023-06-27 NOTE — Telephone Encounter (Signed)
Pt's daughter Sabino Dick called stating that the pt is having difficulty walking, not eating, and has some swelling in her feet mostly toes.  Danita put pt on speaker phone to speak with this nurse.  Pt is currently taking Lansurf and receiving Bevacizumab.  Pt stated the symptoms started around the time pt was in clinic on 06/19/2023 with Vincent Gros, NP.  Reviewed provider's note and infusion notes from around that time.  At last office visit pt's ANC was 0.3.  Pt denied having any fevers recently when asked by this nurse.  Pt's voice is raspy which the daughter stated just recently started w/in the last week.  Pt c/o of numbness in her bilateral toes and right knee.  Pt stated the her right leg is what's giving her the most pain.  Pt and daughter denied extreme swelling in the rt leg.  Pt stated the leg is warm to touch and denied redness in the leg.  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  Pt has a prescription for Gabapentin.  Pt denied taking the medication.  Recommended the pt to restart taking the Gabapentin and apply warm socks and blankets to the extremities that the pt is experiencing neuropathy.  Pt verbalized understanding and stated she would restart the Gabapentin.  Pt's daughter is extremely concerned about the pt because the pt is not really eating, drinking, and barely getting up moving around.  Pt's daughter stated the pt was very independent and was walking around independently several weeks ago but now is barely moving.  Pt denied abdominal swelling but does have mild SOB.  Pt stated the SOB isn't anything new and it hasn't gotten worse.  Asked pt has she been monitoring her BP since she's on Bevacizumab and pt stated not really.  Pt was unable to check BP while on the phone with this nurse but does have the equipment to check her BP at home.  Pt stated her urine is yellow in color but wasn't able to say whether it was light yellow or dark yellow.  Pt's daughter  wanted to know if the pt could be seen if possible today.  Stated this nurse will need to check with [facility] to see if they have availability to see the patient and will f/u with them on [facility] response.  Pt and daughter agreed and will await this nurses return call.

## 2023-06-27 NOTE — Telephone Encounter (Signed)
Spoke with pt's daughter regarding appts with [facility] on 06/30/2023.  Pt's daughter confirmed appts.

## 2023-06-30 ENCOUNTER — Other Ambulatory Visit: Payer: Federal, State, Local not specified - PPO

## 2023-06-30 ENCOUNTER — Inpatient Hospital Stay (HOSPITAL_BASED_OUTPATIENT_CLINIC_OR_DEPARTMENT_OTHER): Payer: Federal, State, Local not specified - PPO | Admitting: Physician Assistant

## 2023-06-30 ENCOUNTER — Inpatient Hospital Stay: Payer: Federal, State, Local not specified - PPO

## 2023-06-30 VITALS — BP 129/79 | HR 103 | Temp 97.9°F | Resp 16 | Wt 91.8 lb

## 2023-06-30 VITALS — BP 129/79 | HR 103 | Temp 97.9°F | Resp 16

## 2023-06-30 DIAGNOSIS — R410 Disorientation, unspecified: Secondary | ICD-10-CM

## 2023-06-30 DIAGNOSIS — D702 Other drug-induced agranulocytosis: Secondary | ICD-10-CM | POA: Diagnosis not present

## 2023-06-30 DIAGNOSIS — C20 Malignant neoplasm of rectum: Secondary | ICD-10-CM

## 2023-06-30 DIAGNOSIS — C787 Secondary malignant neoplasm of liver and intrahepatic bile duct: Secondary | ICD-10-CM

## 2023-06-30 DIAGNOSIS — G620 Drug-induced polyneuropathy: Secondary | ICD-10-CM | POA: Diagnosis not present

## 2023-06-30 DIAGNOSIS — T451X5A Adverse effect of antineoplastic and immunosuppressive drugs, initial encounter: Secondary | ICD-10-CM

## 2023-06-30 DIAGNOSIS — C182 Malignant neoplasm of ascending colon: Secondary | ICD-10-CM

## 2023-06-30 DIAGNOSIS — Z95828 Presence of other vascular implants and grafts: Secondary | ICD-10-CM | POA: Diagnosis not present

## 2023-06-30 DIAGNOSIS — K5909 Other constipation: Secondary | ICD-10-CM

## 2023-06-30 DIAGNOSIS — Z5112 Encounter for antineoplastic immunotherapy: Secondary | ICD-10-CM | POA: Diagnosis not present

## 2023-06-30 LAB — CBC WITH DIFFERENTIAL (CANCER CENTER ONLY)
Abs Immature Granulocytes: 0.02 10*3/uL (ref 0.00–0.07)
Basophils Absolute: 0 10*3/uL (ref 0.0–0.1)
Basophils Relative: 0 %
Eosinophils Absolute: 0 10*3/uL (ref 0.0–0.5)
Eosinophils Relative: 0 %
HCT: 26.4 % — ABNORMAL LOW (ref 36.0–46.0)
Hemoglobin: 8.7 g/dL — ABNORMAL LOW (ref 12.0–15.0)
Immature Granulocytes: 1 %
Lymphocytes Relative: 22 %
Lymphs Abs: 0.8 10*3/uL (ref 0.7–4.0)
MCH: 28.6 pg (ref 26.0–34.0)
MCHC: 33 g/dL (ref 30.0–36.0)
MCV: 86.8 fL (ref 80.0–100.0)
Monocytes Absolute: 0.5 10*3/uL (ref 0.1–1.0)
Monocytes Relative: 15 %
Neutro Abs: 2.2 10*3/uL (ref 1.7–7.7)
Neutrophils Relative %: 62 %
Platelet Count: 189 10*3/uL (ref 150–400)
RBC: 3.04 MIL/uL — ABNORMAL LOW (ref 3.87–5.11)
RDW: 14.7 % (ref 11.5–15.5)
WBC Count: 3.5 10*3/uL — ABNORMAL LOW (ref 4.0–10.5)
nRBC: 0 % (ref 0.0–0.2)

## 2023-06-30 LAB — CMP (CANCER CENTER ONLY)
ALT: 11 U/L (ref 0–44)
AST: 17 U/L (ref 15–41)
Albumin: 3 g/dL — ABNORMAL LOW (ref 3.5–5.0)
Alkaline Phosphatase: 69 U/L (ref 38–126)
Anion gap: 9 (ref 5–15)
BUN: 16 mg/dL (ref 8–23)
CO2: 27 mmol/L (ref 22–32)
Calcium: 8.7 mg/dL — ABNORMAL LOW (ref 8.9–10.3)
Chloride: 95 mmol/L — ABNORMAL LOW (ref 98–111)
Creatinine: 0.58 mg/dL (ref 0.44–1.00)
GFR, Estimated: >60 mL/min (ref >60)
Glucose, Bld: 91 mg/dL (ref 70–99)
Potassium: 3.3 mmol/L — ABNORMAL LOW (ref 3.5–5.1)
Sodium: 131 mmol/L — ABNORMAL LOW (ref 135–145)
Total Bilirubin: 0.6 mg/dL (ref <1.2)
Total Protein: 7.2 g/dL (ref 6.5–8.1)

## 2023-06-30 LAB — MAGNESIUM: Magnesium: 1.9 mg/dL (ref 1.7–2.4)

## 2023-06-30 MED ORDER — SODIUM CHLORIDE 0.9 % IV SOLN: Freq: Once | INTRAVENOUS | Status: AC

## 2023-06-30 NOTE — Progress Notes (Signed)
Symptom Management Consult Note [name]    Patient Care Team: Patient, No Pcp Per as PCP - General (General Practice) [name], NP as Nurse Practitioner (Oncology) [name], MD as Consulting Physician (Oncology) [name], RN (Inactive) as Oncology Nurse Navigator    Name / MRN / DOB: Brittney Spencer  [id number]  03-18-1951   Date of visit: 06/30/2023   Chief Complaint/Reason for visit: generalized weakness and neuropathy   Current Therapy: Lonsurf and bevacizumab   Last treatment:  Day 15   Cycle 2 on 06/19/23   ASSESSMENT & PLAN: Patient is a 72 y.o. female with oncologic history of rectal cancer metastasized to liver followed by Dr. Mosetta Putt.  I have viewed most recent oncology note and lab work.    #Rectal cancer metastasized to liver  - Next appointment with oncologist is 07/03/22  #Neutropenia -Treatment related -WBC 3.5, ANC WNL. No infectious symptoms. Discussed neutropenic precautions.  #Neuropathy -Worsening, still able to perform ADLs independently.  -Has been prescribed gabapentin in the past however never started.  Lengthy discussion had with patient and daughter regarding neuropathy management.  Patient is now agreeable to start taking gabapentin as it was prescribed.  She will follow-up with oncologist at future appointments for reassessment.  #Constipation -Benign abdominal exam.  Normoactive bowel sounds. -Patient with decreased p.o. intake which could be contributing to lack of bowel movement.  Patient is planning to make greater effort to eat meals and will start MiraLAX which she has used in the past. -Patient received 1L IVF in clinic to help with hydration.  #Confusion -Resolved PTA. -Neuro exam is normal. No symptoms of UTI. -Engaged in shared decision making regarding further work up of this. They prefer to hold on CT head as symptoms have resolved. -They know if confusion returns she will need to be  reassessed and likely imaging.    Strict ED precautions discussed should symptoms worsen.   Heme/Onc History: Oncology History Overview Note  Cancer Staging Cancer of ascending colon [facility]) Staging form: Colon and Rectum, AJCC 7th Edition - Clinical stage from 02/07/2016: Stage IIIC (T4b, N1b, M0) - Signed by [name], MD on 03/04/2016 Laterality: Right Residual tumor (R): R2 - Macroscopic    Cancer of ascending colon (HCC)  10/25/2015 Imaging   CT ABD/PELVIS:  Inflammatory changes inferior to the cecal tip appear improved, there is still irregular soft tissue thickening of the cecal tip, and there are adjacent prominent lymph nodes in the ileocolonic mesentery, measuring 13 mm on image 49 and 8 mm on image 52. In addition, there is a 2.5 x 1.8 cm nodule on image 46 which has central low density. Therefore, these findings are moderately suspicious for an underlying cecal malignancy with perforation.    01/19/2016 Procedure   COLONOSCOPY per Dr. [name]: Fungating, ulcerated mass almost obstructing mid ascending colon   01/19/2016 Initial Biopsy   Diagnosis Surgical [P], cecal mass - INVASIVE ADENOCARCINOMA WITH ULCERATION. - SEE COMMENT.   02/05/2016 Tumor Marker   Patient's tumor was tested for the following markers: CEA Results of the tumor marker test revealed 5.7.   02/07/2016 Initial Diagnosis   Cancer of ascending colon (HCC)   02/07/2016 Definitive Surgery   Laparoscopic assisted right hemicolectomy and right salpingo oopherectomy--[name]   02/07/2016 Pathologic Stage   p T4 N1b   2/43 nodes +   02/07/2016 Pathology Results   MMR normal; G2 adenocarcinoma;proximal & distal margins negative; soft tissue mass on  pelvic sidewall + for adenocarcinoma with positive margin MSI Stable   03/08/2016 Imaging   CT chest negative for metastasis.    03/19/2016 - 04/25/2016 Radiation Therapy   Adjuvant irradiation, 50 gray in 28 fractions   03/19/2016 - 04/22/2016 Chemotherapy    Xeloda 1500 mg twice daily, started on 03/19/2016, dose reduced to 1000 mg twice daily from week 3 due to neutropenia, and patient stopped 3 days before last dose radiation due to difficulty swallowing the pill    05/20/2016 -  Adjuvant Chemotherapy   Patient declined adjuvant chemotherapy   09/16/2016 Imaging   CT CAP w Contrast 1. No evidence of local tumor recurrence at the ileocolic anastomosis. 2. No findings suspicious for metastatic disease in the chest, abdomen or pelvis. 3. Nonspecific trace free fluid in the pelvic cul-de-sac. 4. Stable solitary 3 mm right upper lobe pulmonary nodule, for which 6 month stability has been demonstrated, probably benign. 5. Additional findings include stable right posterior pericardial cyst and small calcified uterine fibroids.   05/13/2017 Imaging   CT CAP W Contrast 05/13/17 IMPRESSION: 1. No current findings of residual or recurrent malignancy. 2. Mild prominence of stool throughout the colon. Nondistended portions of the rectum. 3. Several tiny pulmonary nodules are stable from the earliest available comparison of 03/08/2016 and probably benign, but may merit surveillance. 4. Other imaging findings of potential clinical significance: Old granulomatous disease. Aortoiliac atherosclerotic vascular disease. Lumbar spondylosis and degenerative disc disease. Stable amount of trace free pelvic fluid.   04/27/2018 Imaging   04/27/2018 CT CAP IMPRESSION: Stable exam. No evidence of recurrent or metastatic carcinoma within the chest, abdomen, or pelvis   04/19/2019 Imaging   CT CAP W Contrast  IMPRESSION: Chest Impression:   1. No evidence of thoracic metastasis. 2. Stable small bilateral pulmonary nodules.   Abdomen / Pelvis Impression:   1. No evidence local colorectal carcinoma recurrence or metastasis in the abdomen pelvis. 2. Post RIGHT hemicolectomy.   01/22/2021 Imaging   CT CAP  IMPRESSION: CT CHEST IMPRESSION   1.  Similar nonspecific pulmonary nodules. 2. New posterior left upper lobe reticulonodular opacity, suspicious for interval mild infection or inflammation. 3. No thoracic adenopathy.   CT ABDOMEN AND PELVIS IMPRESSION   1. Further decrease in size of high left hepatic lobe 3 mm low-density lesion. No new or progressive metastatic disease within the abdomen or pelvis. 2. Similar trace free pelvic fluid. 3. Similar nonspecific mid rectal wall thickening. 4.  Aortic Atherosclerosis (ICD10-I70.0).   04/23/2021 Imaging   CT CAP  IMPRESSION: 1. Treated metastatic lesion between segments 2 and 3 of the liver, slightly smaller and less distinct than prior examination. No other signs of definite metastatic disease elsewhere in the abdomen or pelvis. 2. Multiple small pulmonary nodules, stable compared to the prior examination, favored to be benign. No definitive findings to suggest metastatic disease to the thorax. 3. Aortic atherosclerosis. 4. Additional incidental findings, as above.   08/13/2021 Imaging   EXAM: CT CHEST, ABDOMEN, AND PELVIS WITH CONTRAST  IMPRESSION: 1. A previously seen PET avid lesion of the anterior left lobe of the liver, hepatic segment II, is no longer discretely appreciable consistent with treatment response of a hepatic metastasis. 2. No evidence of new metastatic disease in the chest, abdomen, or pelvis. 3. Interval increase in a small focus of consolidation and nodularity of the medial left upper lobe, consistent with minimal, ongoing atypical infection. Additional tiny bilateral pulmonary nodules are stable and almost certainly incidental benign. Attention  on follow-up. 4. Status post right hemicolectomy and ileocolic anastomosis.   07/02/2022 Imaging    IMPRESSION: CHEST IMPRESSION:   1. No evidence of thoracic metastasis. 2. Stable small pulmonary nodules.   PELVIS IMPRESSION:   1. Stable to slight decrease in size of subcapsular lesion in  the RIGHT hepatic lobe. 2. No evidence of new or progressive disease in the abdomen pelvis. 3.  Aortic Atherosclerosis (ICD10-I70.0).     12/31/2022 Imaging    IMPRESSION: 1. Right hepatic lobe metastasis has undergone mild-to-moderate enlargement since 10/07/2022. A left hepatic lobe 5 mm lesion is not readily apparent on the prior, suspicious for a new metastasis. 2. Similar nonspecific tiny pulmonary nodules. 3. Similar amorphous soft tissue thickening within the posterior superior right hemipelvis, hypermetabolic on prior PET and indeterminate for residual disease versus scarring. 4. Similar equivocal soft tissue fullness within the rectum. 5. Increased size of an abdominopelvic ventral wall nodule for which subcutaneous metastasis are a concern. 6. New trace right pelvic fluid. 7. Incidental findings, including: Aortic Atherosclerosis (ICD10-I70.0). 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 - Patent side-to-side ileo-colonic anastomosis, characterized by healthy appearing mucosa. - The examined portion of the ileum was normal. - One diminutive polyp in the proximal transverse colon, removed with a cold biopsy forceps. Resected and retrieved. - Likely malignant partially obstructing tumor in the mid rectum. Biopsied. Tattooed. - The examination was otherwise normal on direct and retroflexion views.   06/15/2020 Initial Biopsy   Diagnosis 1. Transverse Colon Polyp - HYPERPLASTIC POLYP 2. Rectum, biopsy - ADENOCARCINOMA ARISING IN A TUBULAR ADENOMA WITH HIGH-GRADE DYSPLASIA. SEE NOTE Diagnosis Note 2. Dr. Luisa Hart reviewed the case and concurs with the diagnosis. Dr. [name] was notified on 06/16/2020.    06/28/2020 Imaging   CT CAP  IMPRESSION: 1. New low-density focus in the anterior aspect of the lateral segment LEFT hepatic lobe measuring 1.2 x 1.0 cm, compatible with small metastatic lesion in the LEFT hepatic lobe. 2. Soft tissue in the RIGHT iliac fossa following RIGHT hemicolectomy invades the psoas musculature and is slowly enlarging over time, more linear on the prior study now highly concerning for recurrence/metastasis to this location. 3. Signs of enteritis, potentially post radiation changes of the small bowel. Tethered small bowel in the RIGHT lower quadrant shows focal thickening and narrowing suspicious for small bowel involvement and developing partial obstruction though currently contrast passes beyond this point into the colon. 4. Rectal thickening in this patient with known rectal mass as described. 5. No evidence of metastatic disease in the chest. 6. Stable small pulmonary nodules. 7.  and aortic atherosclerosis.   Aortic Atherosclerosis (ICD10-I70.0) and Emphysema (ICD10-J43.9).   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 Exam positive for FDG avid rectal tumor which corresponds to the recent colonoscopy findings. 2. FDG avid soft tissue mass within the right iliac fossa is noted and consistent with local tumor recurrence from previous ascending colon tumor. 3. Lateral segment left lobe of liver lesion is FDG avid concerning for liver metastasis. 4. No specific findings identified to suggest metastatic disease to the chest.   08/10/2020 -  Chemotherapy   First-line FOLFIRI q2weeks starting 08/10/20. dose reduced with cycle 1.  Irinotecan/5FU increased and Bevacizumab added with cycle 2 on 08/23/2020    08/17/2020 - 03/09/2022 Chemotherapy   Patient is on Treatment Plan : COLORECTAL FOLFIRI + Bevacizumab q14d     10/27/2020 Imaging   CT CAP  IMPRESSION: 1. Interval decrease in size of  the hypermetabolic left hepatic lesion, consistent with metastatic disease. No new liver lesion evident. 2. Interval resolution of the hypermetabolic soft tissue lesion along the right iliac fossa with no measurable soft tissue lesion remaining at this location today. 3. Similar appearance of soft tissue fullness in the rectum at the site of the hypermetabolic lesion seen previously. 4. Stable tiny bilateral pulmonary nodules. Continued attention on follow-up recommended. 5. Small volume free fluid in the pelvis. 6. Aortic Atherosclerosis (ICD10-I70.0).   01/22/2021 Imaging   CT CAP  IMPRESSION: CT CHEST IMPRESSION   1. Similar nonspecific pulmonary nodules. 2. New posterior left upper lobe reticulonodular opacity, suspicious for interval mild infection or inflammation. 3. No thoracic adenopathy.   CT ABDOMEN AND PELVIS IMPRESSION   1. Further decrease in size of high left hepatic lobe 3 mm low-density lesion. No new or progressive metastatic disease within the abdomen or pelvis. 2. Similar trace free pelvic fluid. 3. Similar nonspecific mid rectal wall thickening. 4.  Aortic Atherosclerosis (ICD10-I70.0).   04/23/2021 Imaging   CT CAP  IMPRESSION: 1. Treated metastatic lesion between segments 2 and 3 of the liver, slightly smaller and less distinct than prior examination. No other signs of definite metastatic disease elsewhere in the abdomen or pelvis. 2. Multiple small pulmonary nodules, stable compared to the prior examination, favored to be benign. No definitive findings to suggest metastatic disease to the thorax. 3. Aortic atherosclerosis. 4. Additional incidental findings, as above.   08/13/2021 Imaging   EXAM: CT CHEST, ABDOMEN, AND PELVIS WITH CONTRAST  IMPRESSION: 1. A previously seen PET avid lesion of the anterior left lobe of the liver, hepatic segment II, is no longer discretely appreciable consistent with treatment response of a hepatic  metastasis. 2. No evidence of new metastatic disease in the chest, abdomen, or pelvis. 3. Interval increase in a small focus of consolidation and nodularity of the medial left upper lobe, consistent with minimal, ongoing atypical infection. Additional tiny bilateral pulmonary nodules are stable and almost certainly incidental benign. Attention on follow-up. 4. Status post right hemicolectomy and ileocolic anastomosis.   03/28/2022 - 04/05/2023 Chemotherapy   Patient is on Treatment Plan : COLORECTAL FOLFOX + Bevacizumab q14d     07/02/2022 Imaging    IMPRESSION: CHEST IMPRESSION:   1. No evidence of thoracic metastasis. 2. Stable small pulmonary nodules.   PELVIS IMPRESSION:   1. Stable to slight decrease in size of subcapsular lesion in the RIGHT hepatic lobe. 2. No evidence of new or progressive disease in the abdomen pelvis.   10/07/2022 Imaging    IMPRESSION: 1. Stable hypovascular mass in the periphery of the right lobe of the liver, which likely represents a treated metastatic lesion. Stable subcentimeter lesion just medial to this is also similar to the recent prior examination. No new hepatic lesions are otherwise noted. 2. Persistent but stable poorly defined soft tissue thickening in the right lower quadrant associated with multiple small bowel loops and the overlying iliopsoas musculature, which corresponds to focal hypermetabolism on remote prior PET-CT. Given the stability, this likely represents a treated metastatic lesion. Continued attention on follow-up studies is recommended. 3. Persistent mass-like thickening in the proximal rectum corresponding to previously noted hypermetabolic rectal neoplasm on  prior PET-CT. This currently measures approximately 2.8 x 2.3 cm and is slightly more apparent than the most recent prior study. 4. Multiple small pulmonary nodules generally stable compared to the prior study, with exception of a new branching nodule in  the anterior aspect of the left upper lobe measuring 7 x 3 mm (mean diameter 5 mm). This is nonspecific. Close attention on follow-up studies is recommended to ensure stability. 5. Aortic atherosclerosis. 6. Additional incidental findings, as above.     12/31/2022 Imaging    IMPRESSION: 1. Right hepatic lobe metastasis has undergone mild-to-moderate enlargement since 10/07/2022. A left hepatic lobe 5 mm lesion is not readily apparent on the prior, suspicious for a new metastasis. 2. Similar nonspecific tiny pulmonary nodules. 3. Similar amorphous soft tissue thickening within the posterior superior right hemipelvis, hypermetabolic on prior PET and indeterminate for residual disease versus scarring. 4. Similar equivocal soft tissue fullness within the rectum. 5. Increased size of an abdominopelvic ventral wall nodule for which subcutaneous metastasis are a concern. 6. New trace right pelvic fluid. 7. Incidental findings, including: Aortic Atherosclerosis (ICD10-I70.0). Possible constipation   05/08/2023 -  Chemotherapy   Patient is on Treatment Plan : COLORECTAL Bevacizumab + Trifluridine/Tipiracil q28d         Interval history-: Discussed the use of AI scribe software for clinical note transcription with the patient, who gave verbal consent to proceed.   SHANTRELLE DELUKE is a 72 y.o. female with oncologic history as above presenting to [facility] today with chief complaint of neuropathy and confusion. Patient is accompanied by her daughter who provides additional history.  Patient reports worsening neuropathy and decreased mobility x 1 week. The patient's neuropathy, previously manageable without medication, has recently intensified, affecting both the hands and feet.  The patient's daughter also noted slower movements and increased confusion on Christmas day, that lasted approximately 48 hours. She describes it as   The patient's neuropathy has particularly affected her mobility,  with increased difficulty walking and a reliance on a walker for balance. The patient reported that her toes were swollen and painful, which has limited her ability to walk barefoot or with socks only. The patient's neuropathy has also affected her hands, with decreased sensation and difficulty picking up objects. Despite these challenges, the patient reported being able to perform necessary tasks such as driving and dressing herself. The patient has been prescribed gabapentin for her neuropathy but has not yet started taking it.  Patient also reports decreased appetite, which she knows is a side effect of her oral chemotherapy. She has had decreased PO intake over the last week. She has not had a bowel movement in 6 days which is not very abnormal for her. The patient has been experiencing constipation, with bowel movements occurring every four to five days. She has not yet Taken Miralx to help with constipation currently. She admits to passing flatus. The patient has been experiencing fatigue, which she also attributes to her treatment. She is still able to care for herself and perform daily activities without assistance. She denies any fever, chills, headache, visual changes, extremity weakness, dysuria or urinary frequency.   ROS  All other systems are reviewed and are negative for acute change except as noted in the HPI.    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 Some residual intraretinal fluid remains, very small localized subfoveal of fluid remains although this continues to slowly resorb. We'll continue to observe.   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   Resolved March 2021 post vitrectomy     Past Surgical History:  Procedure Laterality Date   COLONOSCOPY  2018   HD-hams   COLONSCOPY  12/2015   IR IMAGING GUIDED PORT INSERTION  08/04/2020   LAPAROSCOPIC RIGHT HEMI COLECTOMY Right 02/07/2016   Procedure: LAPAROSCOPIC ASSISTED RIGHT HEMI COLECTOMY AND RIGHT SALPINGO OOPHERECTOMY;  Surgeon: Glenna Fellows, MD;  Location: [facility];  Service: General;  Laterality: Right;   RETINAL DETACHMENT SURGERY  09/2019    Social History   Socioeconomic History   Marital status: Widowed    Spouse name: Not on file   Number of children: Not on file   Years of education: Not on file   Highest education level: Not on file  Occupational History   Not on file  Tobacco Use   Smoking status: Never   Smokeless tobacco: Never  Vaping Use   Vaping status: Never Used  Substance and Sexual Activity   Alcohol use: No    Alcohol/week: 0.0 standard drinks of alcohol   Drug use: No   Sexual activity: Yes    Birth control/protection: None  Other Topics Concern   Not on file  Social History Narrative   Not on file   Social Drivers of Health   Financial Resource Strain: Not on file  Food Insecurity: Not on file  Transportation Needs: Not on file  Physical Activity: Not on file  Stress: Not on file  Social Connections: Not on file  Intimate Partner Violence: Not on file    Family History  Problem Relation Age of Onset   Cancer Mother        lung cancer    Cancer Maternal Grandfather        unknown cancer    Colon cancer Neg Hx    Esophageal cancer Neg Hx    Rectal cancer Neg Hx    Stomach cancer Neg Hx    Colon polyps Neg Hx      Current Outpatient Medications:    ALPRAZolam (XANAX) 0.25 MG tablet, Take 1 tablet (0.25 mg total) by mouth daily as needed for anxiety., Disp: 30 tablet, Rfl: 0   Cholecalciferol (VITAMIN D3 PO), Take by mouth daily., Disp: , Rfl:    docusate sodium (COLACE) 100 MG capsule, 1 capsule as needed, Disp: , Rfl:    gabapentin (NEURONTIN) 100 MG capsule, Take 1 capsule (100 mg total) by mouth at bedtime., Disp: 30 capsule, Rfl: 1   lidocaine-prilocaine (EMLA) cream, Apply 1 Application topically as needed., Disp: 30 g, Rfl: 1  LONSURF 20-8.19 MG tablet, TAKE 2 TABLETS BY MOUTH IN THE MORNING AND 3 TABLETS IN THE EVENING ON DAYS 1-5 AND DAYS 8-12 OF A 28 DAY CYCLE. TAKE WITHIN 1 HOUR AFTER MORNING AND EVENING MEALS., Disp: 60 tablet, Rfl: 1   NORVASC 2.5 MG tablet, TAKE 1 TABLET BY MOUTH EVERY DAY, Disp: 90 tablet, Rfl: 1   ondansetron (ZOFRAN) 8 MG tablet, Take 1 tablet (8 mg total) by mouth every 8 (eight) hours as needed for nausea or vomiting., Disp: 20 tablet, Rfl: 2   prochlorperazine (COMPAZINE) 10 MG tablet, Take 1 tablet (10 mg total) by mouth every 6 (six) hours as needed for nausea or vomiting., Disp: 30 tablet, Rfl: 2  Current Facility-Administered Medications:    0.9 %  sodium chloride infusion, 500 mL, Intravenous, Once, Danis, Starr Lake III, MD  PHYSICAL EXAM: ECOG FS:1 - Symptomatic but completely ambulatory    Vitals:   06/30/23 0932  BP: 129/79  Pulse: (!) 103  Resp: 16  Temp: 97.9 F (36.6 C)  SpO2: 100%   Physical Exam Vitals and nursing note reviewed.  Constitutional:      Appearance: She is not ill-appearing or toxic-appearing.     Comments: Thin appearing female  HENT:     Head: Normocephalic.     Mouth/Throat:     Mouth: Mucous membranes are dry.  Eyes:      Conjunctiva/sclera: Conjunctivae normal.  Cardiovascular:     Rate and Rhythm: Normal rate and regular rhythm.     Pulses: Normal pulses.     Heart sounds: Normal heart sounds.     Comments: HR in 90s during exam Pulmonary:     Effort: Pulmonary effort is normal.     Breath sounds: Normal breath sounds.  Abdominal:     General: Bowel sounds are normal. There is no distension.     Palpations: Abdomen is soft.     Tenderness: There is no abdominal tenderness. There is no guarding.  Musculoskeletal:        General: Normal range of motion.     Cervical back: Normal range of motion.  Feet:     Right foot:     Skin integrity: Skin integrity normal.     Left foot:     Skin integrity: Skin integrity normal.  Skin:    General: Skin is warm and dry.  Neurological:     Mental Status: She is alert.     Comments: Speech is clear and goal oriented, follows commands CN III-XII intact, no facial droop Normal strength in upper and lower extremities bilaterally including dorsiflexion and plantar flexion, strong and equal grip strength Sensation normal to light and sharp touch Moves extremities without ataxia, coordination intact Normal finger to nose and rapid alternating movements Normal gait and balance          LABORATORY DATA: I have reviewed the data as listed    Latest Ref Rng & Units 06/30/2023    9:37 AM 06/19/2023    9:20 AM 06/05/2023    7:46 AM  CBC  WBC 4.0 - 10.5 K/uL 3.5  1.0  2.0   Hemoglobin 12.0 - 15.0 g/dL 8.7  8.7  16.1   Hematocrit 36.0 - 46.0 % 26.4  26.5  32.2   Platelets 150 - 400 K/uL 189  81  164         Latest Ref Rng & Units 06/30/2023    9:37 AM 06/19/2023    9:20 AM 05/22/2023  8:21 AM  CMP  Glucose 70 - 99 mg/dL 91  91  161   BUN 8 - 23 mg/dL 16  12  14    Creatinine 0.44 - 1.00 mg/dL 0.96  0.45  4.09   Sodium 135 - 145 mmol/L 131  134  135   Potassium 3.5 - 5.1 mmol/L 3.3  3.5  3.4   Chloride 98 - 111 mmol/L 95  99  103   CO2 22 - 32  mmol/L 27  27  26    Calcium 8.9 - 10.3 mg/dL 8.7  8.8  8.6   Total Protein 6.5 - 8.1 g/dL 7.2  7.0  7.1   Total Bilirubin <1.2 mg/dL 0.6  0.6  0.4   Alkaline Phos 38 - 126 U/L 69  79  93   AST 15 - 41 U/L 17  13  18    ALT 0 - 44 U/L 11  6  7         RADIOGRAPHIC STUDIES (from last 24 hours if applicable) I have personally reviewed the radiological images as listed and agreed with the findings in the report. No results found.      Visit Diagnosis: 1. 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 Port-A-Cath in place   3. Peripheral neuropathy due to chemotherapy (HCC)   4. Drug-induced neutropenia (HCC)   5. Other constipation   6. Episode of confusion      No orders of the defined types were placed in this encounter.   All questions were answered. The patient knows to call the clinic with any problems, questions or concerns. No barriers to learning was detected.  A total of more than 30 minutes were spent on this encounter with face-to-face time and non-face-to-face time, including preparing to see the patient, ordering tests and/or medications, counseling the patient and coordination of care as outlined above.    Thank you for allowing me to participate in the care of this patient.    Shanon Ace, PA-C Department of Hematology/Oncology [facility] at [facility] Phone: [phone]  [phone]    06/30/2023 11:26 AM

## 2023-07-02 NOTE — Assessment & Plan Note (Signed)
 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  -She began first-line chemo with dose-reduced FOLFIRI and Beva in 2/22.  -we changed to FOLFOX with continued Beva on 03/28/22. She tolerated well overall with some fatigue and cold sensitivity. -I personally reviewed her restaging CT from 10/07/2022 which showed overall SD in liver and rectum, stable and indermininate lung nodules, no other new lesions  -She is tolerating chemotherapy overall well, will continue. -I personally reviewed her restaging CT scan from October 07, 2022, which showed overall stable disease.  She has multiple small lung nodules, indeterminate, including one 7 mm nodule, will continue monitoring. 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 -Restaging CT scan on April 14, 2023 unfortunately showed disease progression in liver. -Her treatment was changed to third line chemotherapy Lonsurf and bevacizumab  on 04/24/2023 -will request N[facility] on her rectal tumor biopsy in 2021

## 2023-07-03 ENCOUNTER — Other Ambulatory Visit: Payer: Federal, State, Local not specified - PPO

## 2023-07-03 ENCOUNTER — Encounter: Payer: Self-pay | Admitting: Hematology

## 2023-07-03 ENCOUNTER — Inpatient Hospital Stay: Payer: Federal, State, Local not specified - PPO | Attending: Nurse Practitioner | Admitting: Hematology

## 2023-07-03 VITALS — BP 141/79 | HR 105 | Temp 98.6°F | Resp 15 | Wt 94.8 lb

## 2023-07-03 DIAGNOSIS — G620 Drug-induced polyneuropathy: Secondary | ICD-10-CM | POA: Diagnosis not present

## 2023-07-03 DIAGNOSIS — C20 Malignant neoplasm of rectum: Secondary | ICD-10-CM | POA: Diagnosis not present

## 2023-07-03 DIAGNOSIS — Z79899 Other long term (current) drug therapy: Secondary | ICD-10-CM | POA: Diagnosis not present

## 2023-07-03 DIAGNOSIS — K5900 Constipation, unspecified: Secondary | ICD-10-CM | POA: Diagnosis not present

## 2023-07-03 DIAGNOSIS — Z1509 Genetic susceptibility to other malignant neoplasm: Secondary | ICD-10-CM | POA: Insufficient documentation

## 2023-07-03 DIAGNOSIS — T451X5D Adverse effect of antineoplastic and immunosuppressive drugs, subsequent encounter: Secondary | ICD-10-CM | POA: Diagnosis not present

## 2023-07-03 DIAGNOSIS — Z923 Personal history of irradiation: Secondary | ICD-10-CM | POA: Diagnosis not present

## 2023-07-03 DIAGNOSIS — R197 Diarrhea, unspecified: Secondary | ICD-10-CM | POA: Diagnosis not present

## 2023-07-03 DIAGNOSIS — Z9049 Acquired absence of other specified parts of digestive tract: Secondary | ICD-10-CM | POA: Insufficient documentation

## 2023-07-03 DIAGNOSIS — R911 Solitary pulmonary nodule: Secondary | ICD-10-CM | POA: Diagnosis not present

## 2023-07-03 DIAGNOSIS — C787 Secondary malignant neoplasm of liver and intrahepatic bile duct: Secondary | ICD-10-CM | POA: Diagnosis not present

## 2023-07-03 NOTE — Progress Notes (Signed)
 [facility]   [phone] [phone]   Clinic Follow up Note   Patient Care Team: Patient, No Pcp Per as PCP - General (General Practice) Ann Mayme POUR, NP as Nurse Practitioner (Oncology) Lanny Callander, MD as Consulting Physician (Oncology) Lenon Channing CROME, RN (Inactive) as Oncology Nurse Navigator  Date of Service:  07/03/2023  CHIEF COMPLAINT: f/u of metastatic rectal cancer  CURRENT THERAPY:  Lonsurf and bevacizumab   Oncology History   Rectal cancer metastasized to liver [facility]) Stage IV with liver and right pelvic metastasis, recurrence from previous colon cancer vs new primary, MMR normal  -Diagnosed by screening colonoscopy 05/2020.  -She began first-line chemo with dose-reduced FOLFIRI and Beva in 2/22.  -we changed to FOLFOX with continued Beva on 03/28/22. She tolerated well overall with some fatigue and cold sensitivity. -I personally reviewed her restaging CT from 10/07/2022 which showed overall SD in liver and rectum, stable and indermininate lung nodules, no other new lesions  -She is tolerating chemotherapy overall well, will continue. -I personally reviewed her restaging CT scan from October 07, 2022, which showed overall stable disease.  She has multiple small lung nodules, indeterminate, including one 7 mm nodule, will continue monitoring. 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 -Restaging CT scan on April 14, 2023 unfortunately showed disease progression in liver. -Her treatment was changed to third line chemotherapy Lonsurf and bevacizumab  on 04/24/2023 -will request [facility] on her rectal tumor biopsy in 2021   Assessment and Plan    Metastatic Colon  Cancer 73 year old with metastatic colon cancer experiencing significant side effects from Lonsurf, including fatigue, anorexia, weight loss, lethargy, and neuropathy. Tumor markers remain elevated, indicating limited response. Poor recovery post-second cycle of Lonsurf. Discussed risks of continuing versus holding chemotherapy, potential outcomes, and limited future treatment options. Informed consent obtained for holding Lonsurf and performing a CT scan to assess treatment efficacy and cancer progression. - Hold Lonsurf - Order CT scan of chest, abdomen, and pelvis with contrast in 7-10 days - Schedule follow-up on January 16 - Refer to dietitian for nutritional support - Discuss potential for home physical therapy - Monitor hemoglobin levels; consider transfusion if necessary - Discuss palliative care and hospice options if chemotherapy is discontinued  Chemotherapy-Induced Neuropathy Significant neuropathy in feet and hands, impairing ambulation and daily activities. Currently on gabapentin  100 mg at night, providing some relief but causing drowsiness. Discussed increasing dose for better symptom control. - Increase gabapentin  to 100 mg in the morning and 200 mg at night - Monitor for effectiveness and side effects - Consider further dose adjustments if needed  Diarrhea and Constipation Alternating diarrhea and constipation, likely due to chemotherapy and low dietary intake. Currently using Miralax  for constipation, which may contribute to diarrhea. Discussed adjusting dosage and using Imodium for diarrhea. - Adjust Miralax  dosage to avoid diarrhea - Use Imodium for diarrhea if >3-4 episodes occur - Ensure adequate hydration with electrolyte solutions - Encourage high-fiber diet; consider liquid nutrition options like Ensure or Boost  General Health Maintenance Emphasized nutritional support and physical activity given current treatment and side effects. - Encourage high-protein,  high-calorie diet - Consider homemade milkshakes with protein powder - Encourage regular physical activity as tolerated - Monitor weight and nutritional  intake closely  Plan -Due to her poor tolerance to current treatment, will hold Lonsurf and bevacizumab  treatment for now - Schedule follow-up on January 16, to reevaluate her candidacy for chemotherapy - Order CT scan before follow-up - Cancel all scheduled chemotherapy appointments - Ensure dietitian and home physical therapy referrals are made and followed up.         SUMMARY OF ONCOLOGIC HISTORY: Oncology History Overview Note  Cancer Staging Cancer of ascending colon [facility]) Staging form: Colon and Rectum, AJCC 7th Edition - Clinical stage from 02/07/2016: Stage IIIC (T4b, N1b, M0) - Signed by Lanny Callander, MD on 03/04/2016 Laterality: Right Residual tumor (R): R2 - Macroscopic    Cancer of ascending colon (HCC)  10/25/2015 Imaging   CT ABD/PELVIS:  Inflammatory changes inferior to the cecal tip appear improved, there is still irregular soft tissue thickening of the cecal tip, and there are adjacent prominent lymph nodes in the ileocolonic mesentery, measuring 13 mm on image 49 and 8 mm on image 52. In addition, there is a 2.5 x 1.8 cm nodule on image 46 which has central low density. Therefore, these findings are moderately suspicious for an underlying cecal malignancy with perforation.    01/19/2016 Procedure   COLONOSCOPY per Dr. Legrand: Fungating, ulcerated mass almost obstructing mid ascending colon   01/19/2016 Initial Biopsy   Diagnosis Surgical [P], cecal mass - INVASIVE ADENOCARCINOMA WITH ULCERATION. - SEE COMMENT.   02/05/2016 Tumor Marker   Patient's tumor was tested for the following markers: CEA Results of the tumor marker test revealed 5.7.   02/07/2016 Initial Diagnosis   Cancer of ascending colon (HCC)   02/07/2016 Definitive Surgery   Laparoscopic assisted right hemicolectomy and right salpingo  oopherectomy--Dr. Mikell   02/07/2016 Pathologic Stage   p T4 N1b   2/43 nodes +   02/07/2016 Pathology Results   MMR normal; G2 adenocarcinoma;proximal & distal margins negative; soft tissue mass on pelvic sidewall + for adenocarcinoma with positive margin MSI Stable   03/08/2016 Imaging   CT chest negative for metastasis.    03/19/2016 - 04/25/2016 Radiation Therapy   Adjuvant irradiation, 50 gray in 28 fractions   03/19/2016 - 04/22/2016 Chemotherapy   Xeloda  1500 mg twice daily, started on 03/19/2016, dose reduced to 1000 mg twice daily from week 3 due to neutropenia, and patient stopped 3 days before last dose radiation due to difficulty swallowing the pill    05/20/2016 -  Adjuvant Chemotherapy   Patient declined adjuvant chemotherapy   09/16/2016 Imaging   CT CAP w Contrast 1. No evidence of local tumor recurrence at the ileocolic anastomosis. 2. No findings suspicious for metastatic disease in the chest, abdomen or pelvis. 3. Nonspecific trace free fluid in the pelvic cul-de-sac. 4. Stable solitary 3 mm right upper lobe pulmonary nodule, for which 6 month stability has been demonstrated, probably benign. 5. Additional findings include stable right posterior pericardial cyst and small calcified uterine fibroids.   05/13/2017 Imaging   CT CAP W Contrast 05/13/17 IMPRESSION: 1. No current findings of residual or recurrent malignancy. 2. Mild prominence of stool throughout the colon. Nondistended portions of the rectum. 3. Several tiny pulmonary nodules are stable from the earliest available comparison of 03/08/2016 and probably benign, but may merit surveillance. 4. Other imaging findings of potential clinical significance: Old granulomatous disease. Aortoiliac atherosclerotic vascular disease. Lumbar spondylosis and degenerative disc disease. Stable amount of trace free pelvic fluid.   04/27/2018 Imaging   04/27/2018 CT CAP IMPRESSION: Stable exam. No  evidence of  recurrent or metastatic carcinoma within the chest, abdomen, or pelvis   04/19/2019 Imaging   CT CAP W Contrast  IMPRESSION: Chest Impression:   1. No evidence of thoracic metastasis. 2. Stable small bilateral pulmonary nodules.   Abdomen / Pelvis Impression:   1. No evidence local colorectal carcinoma recurrence or metastasis in the abdomen pelvis. 2. Post RIGHT hemicolectomy.   01/22/2021 Imaging   CT CAP  IMPRESSION: CT CHEST IMPRESSION   1. Similar nonspecific pulmonary nodules. 2. New posterior left upper lobe reticulonodular opacity, suspicious for interval mild infection or inflammation. 3. No thoracic adenopathy.   CT ABDOMEN AND PELVIS IMPRESSION   1. Further decrease in size of high left hepatic lobe 3 mm low-density lesion. No new or progressive metastatic disease within the abdomen or pelvis. 2. Similar trace free pelvic fluid. 3. Similar nonspecific mid rectal wall thickening. 4.  Aortic Atherosclerosis (ICD10-I70.0).   04/23/2021 Imaging   CT CAP  IMPRESSION: 1. Treated metastatic lesion between segments 2 and 3 of the liver, slightly smaller and less distinct than prior examination. No other signs of definite metastatic disease elsewhere in the abdomen or pelvis. 2. Multiple small pulmonary nodules, stable compared to the prior examination, favored to be benign. No definitive findings to suggest metastatic disease to the thorax. 3. Aortic atherosclerosis. 4. Additional incidental findings, as above.   08/13/2021 Imaging   EXAM: CT CHEST, ABDOMEN, AND PELVIS WITH CONTRAST  IMPRESSION: 1. A previously seen PET avid lesion of the anterior left lobe of the liver, hepatic segment II, is no longer discretely appreciable consistent with treatment response of a hepatic metastasis. 2. No evidence of new metastatic disease in the chest, abdomen, or pelvis. 3. Interval increase in a small focus of consolidation and nodularity of the medial left upper  lobe, consistent with minimal, ongoing atypical infection. Additional tiny bilateral pulmonary nodules are stable and almost certainly incidental benign. Attention on follow-up. 4. Status post right hemicolectomy and ileocolic anastomosis.   07/02/2022 Imaging    IMPRESSION: CHEST IMPRESSION:   1. No evidence of thoracic metastasis. 2. Stable small pulmonary nodules.   PELVIS IMPRESSION:   1. Stable to slight decrease in size of subcapsular lesion in the RIGHT hepatic lobe. 2. No evidence of new or progressive disease in the abdomen pelvis. 3.  Aortic Atherosclerosis (ICD10-I70.0).     12/31/2022 Imaging    IMPRESSION: 1. Right hepatic lobe metastasis has undergone mild-to-moderate enlargement since 10/07/2022. A left hepatic lobe 5 mm lesion is not readily apparent on the prior, suspicious for a new metastasis. 2. Similar nonspecific tiny pulmonary nodules. 3. Similar amorphous soft tissue thickening within the posterior superior right hemipelvis, hypermetabolic on prior PET and indeterminate for residual disease versus scarring. 4. Similar equivocal soft tissue fullness within the rectum. 5. Increased size of an abdominopelvic ventral wall nodule for which subcutaneous metastasis are a concern. 6. New trace right pelvic fluid. 7. Incidental findings, including: Aortic Atherosclerosis (ICD10-I70.0). Possible constipation   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)  06/15/2020 Procedure   Screening Colonoscopy by Dr Legrand  IMPRESSION - Decreased sphincter tone and internal hemorrhoids that prolapse with straining, but require manual replacement into the anal canal (Grade III) found on digital rectal exam. - Patent side-to-side ileo-colonic anastomosis, characterized by healthy appearing mucosa. - The examined portion of the ileum was normal. - One diminutive polyp in the proximal transverse colon, removed with a cold biopsy forceps. Resected and retrieved. - Likely  malignant partially obstructing  tumor in the mid rectum. Biopsied. Tattooed. - The examination was otherwise normal on direct and retroflexion views.   06/15/2020 Initial Biopsy   Diagnosis 1. Transverse Colon Polyp - HYPERPLASTIC POLYP 2. Rectum, biopsy - ADENOCARCINOMA ARISING IN A TUBULAR ADENOMA WITH HIGH-GRADE DYSPLASIA. SEE NOTE Diagnosis Note 2. Dr. Belvie reviewed the case and concurs with the diagnosis. Dr. Legrand was notified on 06/16/2020.   06/28/2020 Imaging   CT CAP  IMPRESSION: 1. New low-density focus in the anterior aspect of the lateral segment LEFT hepatic lobe measuring 1.2 x 1.0 cm, compatible with small metastatic lesion in the LEFT hepatic lobe. 2. Soft tissue in the RIGHT iliac fossa following RIGHT hemicolectomy invades the psoas musculature and is slowly enlarging over time, more linear on the prior study now highly concerning for recurrence/metastasis to this location. 3. Signs of enteritis, potentially post radiation changes of the small bowel. Tethered small bowel in the RIGHT lower quadrant shows focal thickening and narrowing suspicious for small bowel involvement and developing partial obstruction though currently contrast passes beyond this point into the colon. 4. Rectal thickening in this patient with known rectal mass as described. 5. No evidence of metastatic disease in the chest. 6. Stable small pulmonary nodules. 7.  and aortic atherosclerosis.   Aortic Atherosclerosis (ICD10-I70.0) and Emphysema (ICD10-J43.9).   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 Exam positive for FDG avid rectal tumor which corresponds to the recent colonoscopy findings. 2. FDG avid soft tissue mass within the right iliac fossa is noted and consistent with local tumor recurrence from previous ascending colon tumor. 3. Lateral segment left lobe of liver lesion is FDG avid concerning for liver  metastasis. 4. No specific findings identified to suggest metastatic disease to the chest.   08/10/2020 -  Chemotherapy   First-line FOLFIRI q2weeks starting 08/10/20. dose reduced with cycle 1. Irinotecan /5FU increased and Bevacizumab  added with cycle 2 on 08/23/2020    08/17/2020 - 03/09/2022 Chemotherapy   Patient is on Treatment Plan : COLORECTAL FOLFIRI + Bevacizumab  q14d     10/27/2020 Imaging   CT CAP  IMPRESSION: 1. Interval decrease in size of the hypermetabolic left hepatic lesion, consistent with metastatic disease. No new liver lesion evident. 2. Interval resolution of the hypermetabolic soft tissue lesion along the right iliac fossa with no measurable soft tissue lesion remaining at this location today. 3. Similar appearance of soft tissue fullness in the rectum at the site of the hypermetabolic lesion seen previously. 4. Stable tiny bilateral pulmonary nodules. Continued attention on follow-up recommended. 5. Small volume free fluid in the pelvis. 6. Aortic Atherosclerosis (ICD10-I70.0).   01/22/2021 Imaging   CT CAP  IMPRESSION: CT CHEST IMPRESSION   1. Similar nonspecific pulmonary nodules. 2. New posterior left upper lobe reticulonodular opacity, suspicious for interval mild infection or inflammation. 3. No thoracic adenopathy.   CT ABDOMEN AND PELVIS IMPRESSION   1. Further decrease in size of high left hepatic lobe 3 mm low-density lesion. No new or progressive metastatic disease within the abdomen or pelvis. 2. Similar trace free pelvic fluid. 3. Similar nonspecific mid rectal wall thickening. 4.  Aortic Atherosclerosis (ICD10-I70.0).   04/23/2021 Imaging   CT CAP  IMPRESSION: 1. Treated metastatic lesion between segments 2 and 3 of the liver, slightly smaller and less distinct than prior examination. No other signs of definite metastatic disease elsewhere in the abdomen or pelvis. 2. Multiple small pulmonary nodules, stable  compared to the  prior examination, favored to be benign. No definitive findings to suggest metastatic disease to the thorax. 3. Aortic atherosclerosis. 4. Additional incidental findings, as above.   08/13/2021 Imaging   EXAM: CT CHEST, ABDOMEN, AND PELVIS WITH CONTRAST  IMPRESSION: 1. A previously seen PET avid lesion of the anterior left lobe of the liver, hepatic segment II, is no longer discretely appreciable consistent with treatment response of a hepatic metastasis. 2. No evidence of new metastatic disease in the chest, abdomen, or pelvis. 3. Interval increase in a small focus of consolidation and nodularity of the medial left upper lobe, consistent with minimal, ongoing atypical infection. Additional tiny bilateral pulmonary nodules are stable and almost certainly incidental benign. Attention on follow-up. 4. Status post right hemicolectomy and ileocolic anastomosis.   03/28/2022 - 04/05/2023 Chemotherapy   Patient is on Treatment Plan : COLORECTAL FOLFOX + Bevacizumab  q14d     07/02/2022 Imaging    IMPRESSION: CHEST IMPRESSION:   1. No evidence of thoracic metastasis. 2. Stable small pulmonary nodules.   PELVIS IMPRESSION:   1. Stable to slight decrease in size of subcapsular lesion in the RIGHT hepatic lobe. 2. No evidence of new or progressive disease in the abdomen pelvis.   10/07/2022 Imaging    IMPRESSION: 1. Stable hypovascular mass in the periphery of the right lobe of the liver, which likely represents a treated metastatic lesion. Stable subcentimeter lesion just medial to this is also similar to the recent prior examination. No new hepatic lesions are otherwise noted. 2. Persistent but stable poorly defined soft tissue thickening in the right lower quadrant associated with multiple small bowel loops and the overlying iliopsoas musculature, which corresponds to focal hypermetabolism on remote prior PET-CT. Given the stability, this likely represents a treated metastatic  lesion. Continued attention on follow-up studies is recommended. 3. Persistent mass-like thickening in the proximal rectum corresponding to previously noted hypermetabolic rectal neoplasm on prior PET-CT. This currently measures approximately 2.8 x 2.3 cm and is slightly more apparent than the most recent prior study. 4. Multiple small pulmonary nodules generally stable compared to the prior study, with exception of a new branching nodule in the anterior aspect of the left upper lobe measuring 7 x 3 mm (mean diameter 5 mm). This is nonspecific. Close attention on follow-up studies is recommended to ensure stability. 5. Aortic atherosclerosis. 6. Additional incidental findings, as above.     12/31/2022 Imaging    IMPRESSION: 1. Right hepatic lobe metastasis has undergone mild-to-moderate enlargement since 10/07/2022. A left hepatic lobe 5 mm lesion is not readily apparent on the prior, suspicious for a new metastasis. 2. Similar nonspecific tiny pulmonary nodules. 3. Similar amorphous soft tissue thickening within the posterior superior right hemipelvis, hypermetabolic on prior PET and indeterminate for residual disease versus scarring. 4. Similar equivocal soft tissue fullness within the rectum. 5. Increased size of an abdominopelvic ventral wall nodule for which subcutaneous metastasis are a concern. 6. New trace right pelvic fluid. 7. Incidental findings, including: Aortic Atherosclerosis (ICD10-I70.0). Possible constipation   05/08/2023 -  Chemotherapy   Patient is on Treatment Plan : COLORECTAL Bevacizumab  + Trifluridine /Tipiracil  q28d        Discussed the use of AI scribe software for clinical note transcription with the patient, who gave verbal consent to proceed.  History of Present Illness   A 73 year old female with a history of metastatic colon cancer presents with concerns about side effects from her oral chemotherapy medication, Lonsurf. She reports experiencing  alternating  diarrhea and constipation, decreased appetite, and lethargy. She also notes neuropathy in her hands and feet, which has been causing difficulty with walking and discomfort when in bed. Her family has observed her sleeping a lot and has noted a decrease in her activity level. The patient's weight has dropped from over 100 to 94 due to decreased food intake. She has been sleeping on the sofa due to discomfort in bed.         All other systems were reviewed with the patient and are negative.  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 Some residual intraretinal fluid remains, very small localized subfoveal of fluid remains although this continues to slowly resorb. We'll continue to observe.   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  ALLERGIES:  is allergic to venofer  [iron  sucrose], fish allergy, peanut-containing drug products, soy allergy (do not select), and buspirone.  MEDICATIONS:  Current Outpatient Medications  Medication Sig Dispense Refill   ALPRAZolam  (XANAX ) 0.25 MG tablet Take 1 tablet (0.25 mg total) by mouth daily as needed for anxiety. 30 tablet 0   Cholecalciferol (VITAMIN D3 PO) Take by mouth daily.     docusate sodium (COLACE) 100 MG capsule 1 capsule as needed     gabapentin  (NEURONTIN ) 100 MG capsule Take 1 capsule (100 mg total) by mouth at bedtime. 30 capsule 1   lidocaine -prilocaine  (EMLA ) cream Apply 1 Application topically as needed. 30 g 1   LONSURF 20-8.19 MG tablet TAKE 2 TABLETS BY MOUTH IN THE MORNING AND 3 TABLETS IN THE EVENING ON DAYS 1-5 AND DAYS 8-12 OF A 28 DAY CYCLE. TAKE WITHIN 1 HOUR AFTER MORNING AND EVENING MEALS. 60 tablet 1   NORVASC  2.5 MG tablet TAKE 1 TABLET BY MOUTH EVERY DAY 90 tablet 1   ondansetron  (ZOFRAN ) 8 MG tablet Take 1 tablet (8 mg total) by mouth every 8 (eight) hours as needed for nausea or vomiting. 20 tablet 2   prochlorperazine  (COMPAZINE ) 10 MG tablet Take 1 tablet (10 mg total) by mouth every 6 (six) hours as needed for nausea or vomiting. 30 tablet 2   Current Facility-Administered Medications  Medication Dose Route Frequency Provider Last Rate Last Admin   0.9 %  sodium chloride  infusion  500 mL Intravenous Once Legrand Victory LITTIE DOUGLAS, MD        PHYSICAL EXAMINATION: ECOG PERFORMANCE STATUS: 3 - Symptomatic, >50% confined to bed  Vitals:   07/03/23 0931  BP: (!) 141/79  Pulse: (!) 105  Resp: 15  Temp: 98.6 F (37 C)  SpO2: 100%   Wt Readings from Last 3 Encounters:  07/03/23 94 lb 12.8 oz (43 kg)  06/30/23 91 lb 12.8 oz (41.6 kg)  06/19/23 94 lb 6.4 oz (42.8 kg)     GENERAL:alert, no distress and comfortable SKIN: skin color, texture, turgor are normal, no rashes or significant lesions EYES: normal, Conjunctiva are pink  and non-injected, sclera clear NECK: supple, thyroid normal size, non-tender, without nodularity LYMPH:  no palpable lymphadenopathy in the cervical, axillary  LUNGS: clear to auscultation and percussion with normal breathing effort HEART: regular rate & rhythm and no murmurs and no lower extremity edema ABDOMEN:abdomen soft, non-tender and normal bowel sounds Musculoskeletal:no cyanosis of digits and no clubbing  NEURO: alert & oriented x 3 with fluent speech, no focal motor/sensory deficits    LABORATORY DATA:  I have reviewed the data as listed    Latest Ref Rng & Units 06/30/2023    9:37 AM 06/19/2023    9:20 AM 06/05/2023    7:46 AM  CBC  WBC 4.0 - 10.5 K/uL 3.5  1.0  2.0   Hemoglobin 12.0 - 15.0 g/dL 8.7  8.7  89.7   Hematocrit 36.0 - 46.0 % 26.4  26.5  32.2   Platelets 150 - 400 K/uL 189  81  164         Latest Ref Rng & Units 06/30/2023    9:37 AM 06/19/2023    9:20 AM 05/22/2023    8:21 AM  CMP  Glucose 70 - 99 mg/dL 91  91  889   BUN 8 - 23 mg/dL 16  12  14    Creatinine 0.44 - 1.00 mg/dL 9.41  9.38  9.33   Sodium 135 - 145 mmol/L 131  134  135   Potassium 3.5 - 5.1 mmol/L 3.3  3.5  3.4   Chloride 98 - 111 mmol/L 95  99  103   CO2 22 - 32 mmol/L 27  27  26    Calcium  8.9 - 10.3 mg/dL 8.7  8.8  8.6   Total Protein 6.5 - 8.1 g/dL 7.2  7.0  7.1   Total Bilirubin <1.2 mg/dL 0.6  0.6  0.4   Alkaline Phos 38 - 126 U/L 69  79  93   AST 15 - 41 U/L 17  13  18    ALT 0 - 44 U/L 11  6  7        RADIOGRAPHIC STUDIES: I have personally reviewed the radiological images as listed and agreed with the findings in the report. No results found.    Orders Placed This Encounter  Procedures   CT CHEST ABDOMEN PELVIS W CONTRAST    Standing Status:   Future    Expected Date:   07/14/2023    Expiration Date:   07/02/2024    If indicated for the ordered procedure, I authorize the administration of contrast media per Radiology protocol:   Yes    Does the patient have a contrast  media/X-ray dye allergy?:   No    Preferred imaging location?:   [facility]    Release to patient:   Immediate    If indicated for  the ordered procedure, I authorize the administration of oral contrast media per Radiology protocol:   Yes   Ambulatory referral to Home Health    Referral Priority:   Routine    Referral Type:   Home Health Care    Referral Reason:   Specialty Services Required    Referred to Provider:   [facility]    Requested Specialty:   Home Health Services    Number of Visits Requested:   1   Sample to Blood Bank    Standing Status:   Future    Expected Date:   07/17/2023    Expiration Date:   07/02/2024   All questions were answered. The patient knows to call the clinic with any problems, questions or concerns. No barriers to learning was detected. The total time spent in the appointment was 30 minutes.     Onita Mattock, MD 07/03/2023

## 2023-07-03 NOTE — Progress Notes (Signed)
 Referral sent to [facility] for home health PT. Patient called and provided direct number to agency for scheduling appts.

## 2023-07-04 ENCOUNTER — Telehealth: Payer: Self-pay | Admitting: *Deleted

## 2023-07-04 NOTE — Telephone Encounter (Signed)
 Patient called to advised she has declined the referral for PT at this time. She has been contacted by the agency and received the information. She does not wish to proceed with this treatment at this time. Patient denies any questions or concerns.

## 2023-07-07 ENCOUNTER — Ambulatory Visit: Payer: Federal, State, Local not specified - PPO

## 2023-07-07 ENCOUNTER — Other Ambulatory Visit: Payer: Federal, State, Local not specified - PPO

## 2023-07-07 ENCOUNTER — Ambulatory Visit: Payer: Federal, State, Local not specified - PPO | Admitting: Nurse Practitioner

## 2023-07-14 ENCOUNTER — Other Ambulatory Visit: Payer: Self-pay

## 2023-07-14 ENCOUNTER — Ambulatory Visit (HOSPITAL_COMMUNITY)
Admission: RE | Admit: 2023-07-14 | Discharge: 2023-07-14 | Disposition: A | Discharge status: Home / Self Care | Payer: Federal, State, Local not specified - PPO | Source: Ambulatory Visit | Attending: Hematology | Admitting: Hematology

## 2023-07-14 ENCOUNTER — Ambulatory Visit: Payer: Federal, State, Local not specified - PPO | Admitting: Podiatry

## 2023-07-14 DIAGNOSIS — C787 Secondary malignant neoplasm of liver and intrahepatic bile duct: Secondary | ICD-10-CM | POA: Diagnosis present

## 2023-07-14 DIAGNOSIS — C20 Malignant neoplasm of rectum: Secondary | ICD-10-CM

## 2023-07-14 MED ORDER — IOHEXOL 300 MG/ML  SOLN
80.0000 mL | Freq: Once | INTRAMUSCULAR | Status: AC | PRN
  Administered 2023-07-14: 80 mL via INTRAVENOUS

## 2023-07-14 MED ORDER — IOHEXOL 300 MG/ML  SOLN
30.0000 mL | Freq: Once | INTRAMUSCULAR | Status: AC | PRN
  Administered 2023-07-14: 30 mL via ORAL

## 2023-07-16 NOTE — Assessment & Plan Note (Signed)
 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  -She began first-line chemo with dose-reduced FOLFIRI and Beva in 2/22.  -we changed to FOLFOX with continued Beva on 03/28/22. She tolerated well overall with some fatigue and cold sensitivity. -I personally reviewed her restaging CT from 10/07/2022 which showed overall SD in liver and rectum, stable and indermininate lung nodules, no other new lesions  -She is tolerating chemotherapy overall well, will continue. -I personally reviewed her restaging CT scan from October 07, 2022, which showed overall stable disease.  She has multiple small lung nodules, indeterminate, including one 7 mm nodule, will continue monitoring. 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 -Restaging CT scan on April 14, 2023 unfortunately showed disease progression in liver. -Her treatment was changed to third line chemotherapy Lonsurf and bevacizumab  on 04/24/2023 -Restaging CT 07/13/2022 showed disease progression in liver  -will request [facility] on her rectal tumor biopsy in 2021

## 2023-07-17 ENCOUNTER — Inpatient Hospital Stay: Payer: Federal, State, Local not specified - PPO | Admitting: Hematology

## 2023-07-17 ENCOUNTER — Encounter: Payer: Self-pay | Admitting: Hematology

## 2023-07-17 ENCOUNTER — Other Ambulatory Visit: Payer: Self-pay | Admitting: *Deleted

## 2023-07-17 ENCOUNTER — Inpatient Hospital Stay: Payer: Federal, State, Local not specified - PPO

## 2023-07-17 VITALS — BP 115/85 | HR 117 | Temp 97.9°F | Resp 17 | Ht 66.0 in | Wt 86.2 lb

## 2023-07-17 DIAGNOSIS — C20 Malignant neoplasm of rectum: Secondary | ICD-10-CM | POA: Diagnosis not present

## 2023-07-17 DIAGNOSIS — C787 Secondary malignant neoplasm of liver and intrahepatic bile duct: Secondary | ICD-10-CM

## 2023-07-17 DIAGNOSIS — D500 Iron deficiency anemia secondary to blood loss (chronic): Secondary | ICD-10-CM

## 2023-07-17 DIAGNOSIS — C182 Malignant neoplasm of ascending colon: Secondary | ICD-10-CM

## 2023-07-17 DIAGNOSIS — Z95828 Presence of other vascular implants and grafts: Secondary | ICD-10-CM

## 2023-07-17 LAB — CBC WITH DIFFERENTIAL (CANCER CENTER ONLY)
Abs Immature Granulocytes: 0.03 10*3/uL (ref 0.00–0.07)
Basophils Absolute: 0 10*3/uL (ref 0.0–0.1)
Basophils Relative: 0 %
Eosinophils Absolute: 0 10*3/uL (ref 0.0–0.5)
Eosinophils Relative: 0 %
HCT: 29.9 % — ABNORMAL LOW (ref 36.0–46.0)
Hemoglobin: 9.7 g/dL — ABNORMAL LOW (ref 12.0–15.0)
Immature Granulocytes: 1 %
Lymphocytes Relative: 14 %
Lymphs Abs: 0.6 10*3/uL — ABNORMAL LOW (ref 0.7–4.0)
MCH: 28.8 pg (ref 26.0–34.0)
MCHC: 32.4 g/dL (ref 30.0–36.0)
MCV: 88.7 fL (ref 80.0–100.0)
Monocytes Absolute: 0.5 10*3/uL (ref 0.1–1.0)
Monocytes Relative: 11 %
Neutro Abs: 3.1 10*3/uL (ref 1.7–7.7)
Neutrophils Relative %: 74 %
Platelet Count: 160 10*3/uL (ref 150–400)
RBC: 3.37 MIL/uL — ABNORMAL LOW (ref 3.87–5.11)
RDW: 17 % — ABNORMAL HIGH (ref 11.5–15.5)
WBC Count: 4.2 10*3/uL (ref 4.0–10.5)
nRBC: 0 % (ref 0.0–0.2)

## 2023-07-17 LAB — MISCELLANEOUS TEST

## 2023-07-17 LAB — COMPREHENSIVE METABOLIC PANEL
ALT: 7 U/L (ref 0–44)
AST: 17 U/L (ref 15–41)
Albumin: 2.9 g/dL — ABNORMAL LOW (ref 3.5–5.0)
Alkaline Phosphatase: 82 U/L (ref 38–126)
Anion gap: 13 (ref 5–15)
BUN: 17 mg/dL (ref 8–23)
CO2: 27 mmol/L (ref 22–32)
Calcium: 9 mg/dL (ref 8.9–10.3)
Chloride: 93 mmol/L — ABNORMAL LOW (ref 98–111)
Creatinine, Ser: 0.57 mg/dL (ref 0.44–1.00)
GFR, Estimated: >60 mL/min (ref >60)
Glucose, Bld: 91 mg/dL (ref 70–99)
Potassium: 3.5 mmol/L (ref 3.5–5.1)
Sodium: 133 mmol/L — ABNORMAL LOW (ref 135–145)
Total Bilirubin: 0.9 mg/dL (ref 0.0–1.2)
Total Protein: 7.2 g/dL (ref 6.5–8.1)

## 2023-07-17 LAB — SAMPLE TO BLOOD BANK

## 2023-07-17 LAB — FERRITIN: Ferritin: 550 ng/mL — ABNORMAL HIGH (ref 11–307)

## 2023-07-17 MED ORDER — MEGESTROL ACETATE 625 MG/5ML PO SUSP: 625.0000 mg | Freq: Every day | ORAL | 0 refills | Status: DC

## 2023-07-17 MED ORDER — HEPARIN SOD (PORK) LOCK FLUSH 100 UNIT/ML IV SOLN: 500.0000 [IU] | Freq: Once | INTRAVENOUS | Status: DC

## 2023-07-17 MED ORDER — SODIUM CHLORIDE 0.9% FLUSH: 10.0000 mL | Freq: Once | INTRAVENOUS | Status: DC

## 2023-07-17 NOTE — Progress Notes (Signed)
[facility]   [phone] [phone]   Clinic Follow up Note   Patient Care Team: Patient, No Pcp Per as PCP - General (General Practice) [name], NP as Nurse Practitioner (Oncology) [name], MD as Consulting Physician (Oncology) [name], RN (Inactive) as Oncology Nurse Navigator  Date of Service:  07/17/2023  CHIEF COMPLAINT: f/u of metastatic rectal cancer  CURRENT THERAPY:  Supportive care  Oncology History   Rectal cancer metastasized to liver [facility]) Stage IV with liver and right pelvic metastasis, recurrence from previous colon cancer vs new primary, MMR normal  -Diagnosed by screening colonoscopy 05/2020.  -She began first-line chemo with dose-reduced FOLFIRI and Beva in 2/22.  -we changed to FOLFOX with continued Beva on 03/28/22. She tolerated well overall with some fatigue and cold sensitivity. -I personally reviewed her restaging CT from 10/07/2022 which showed overall SD in liver and rectum, stable and indermininate lung nodules, no other new lesions  -She is tolerating chemotherapy overall well, will continue. -I personally reviewed her restaging CT scan from October 07, 2022, which showed overall stable disease.  She has multiple small lung nodules, indeterminate, including one 7 mm nodule, will continue monitoring. 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 -Restaging CT scan on April 14, 2023 unfortunately showed disease progression in liver. -Her treatment was changed to third line chemotherapy Lonsurf and bevacizumab on 04/24/2023 -Restaging CT 07/13/2022 showed disease progression in liver     Assessment and Plan    Metastatic Colon  Cancer Progression of metastatic colon cancer primarily in the liver with small lung nodules. Symptoms include significant weight loss, fatigue, decreased mobility, and low protein levels leading to edema. Discussed hospice care for symptom management and quality of life. The patient prefers quality of life over aggressive treatment. Risks of further chemotherapy were discussed given frailty and poor nutritional status. - Refer to hospice care for home evaluation and support - Prescribe liquid appetite stimulant (Megestrol) - Recommend use of a recliner or hospital bed to elevate legs and reduce swelling - Suggest wearing compression stockings for a few hours daily to manage edema - Provide a handicap parking permit  Edema Bilateral lower extremity edema likely due to low protein levels from poor nutritional intake and prolonged sitting. No pain but significant swelling below the knees. Importance of leg elevation and compression stockings discussed. - Recommend elevating legs using a recliner or hospital bed - Suggest wearing compression stockings for a few hours daily  Goal of care discussion and DNR Patient has a living will and does not wish to be resuscitated.  -Her overall condition has declined rapidly in the past months, she is not a candidate for chemotherapy.  I recommend home hospice.  I reviewed the home hospice service in detail.  Hospice care will provide necessary equipment such as a walker, bedside commode, and shower chair. -Patient wants to think about it and call us back in the next few days.  Plan -Due to her rapidly declining, I recommend home hospice care - Patient to call within a week to confirm hospice care decision - No scheduled follow-up appointment; available by phone for any concerns.         SUMMARY OF ONCOLOGIC HISTORY: Oncology  History Overview Note  Cancer Staging Cancer of ascending colon [facility]) Staging form: Colon and Rectum, AJCC 7th Edition -  Clinical stage from 02/07/2016: Stage IIIC (T4b, N1b, M0) - Signed by [name], MD on 03/04/2016 Laterality: Right Residual tumor (R): R2 - Macroscopic    Cancer of ascending colon (HCC)  10/25/2015 Imaging   CT ABD/PELVIS:  Inflammatory changes inferior to the cecal tip appear improved, there is still irregular soft tissue thickening of the cecal tip, and there are adjacent prominent lymph nodes in the ileocolonic mesentery, measuring 13 mm on image 49 and 8 mm on image 52. In addition, there is a 2.5 x 1.8 cm nodule on image 46 which has central low density. Therefore, these findings are moderately suspicious for an underlying cecal malignancy with perforation.    01/19/2016 Procedure   COLONOSCOPY per Dr. [name]: Fungating, ulcerated mass almost obstructing mid ascending colon   01/19/2016 Initial Biopsy   Diagnosis Surgical [P], cecal mass - INVASIVE ADENOCARCINOMA WITH ULCERATION. - SEE COMMENT.   02/05/2016 Tumor Marker   Patient's tumor was tested for the following markers: CEA Results of the tumor marker test revealed 5.7.   02/07/2016 Initial Diagnosis   Cancer of ascending colon (HCC)   02/07/2016 Definitive Surgery   Laparoscopic assisted right hemicolectomy and right salpingo oopherectomy--[name]   02/07/2016 Pathologic Stage   p T4 N1b   2/43 nodes +   02/07/2016 Pathology Results   MMR normal; G2 adenocarcinoma;proximal & distal margins negative; soft tissue mass on pelvic sidewall + for adenocarcinoma with positive margin MSI Stable   03/08/2016 Imaging   CT chest negative for metastasis.    03/19/2016 - 04/25/2016 Radiation Therapy   Adjuvant irradiation, 50 gray in 28 fractions   03/19/2016 - 04/22/2016 Chemotherapy   Xeloda 1500 mg twice daily, started on 03/19/2016, dose reduced to 1000 mg twice daily from week 3 due to neutropenia, and patient stopped 3 days before last dose radiation due to difficulty swallowing the pill    05/20/2016 -  Adjuvant  Chemotherapy   Patient declined adjuvant chemotherapy   09/16/2016 Imaging   CT CAP w Contrast 1. No evidence of local tumor recurrence at the ileocolic anastomosis. 2. No findings suspicious for metastatic disease in the chest, abdomen or pelvis. 3. Nonspecific trace free fluid in the pelvic cul-de-sac. 4. Stable solitary 3 mm right upper lobe pulmonary nodule, for which 6 month stability has been demonstrated, probably benign. 5. Additional findings include stable right posterior pericardial cyst and small calcified uterine fibroids.   05/13/2017 Imaging   CT CAP W Contrast 05/13/17 IMPRESSION: 1. No current findings of residual or recurrent malignancy. 2. Mild prominence of stool throughout the colon. Nondistended portions of the rectum. 3. Several tiny pulmonary nodules are stable from the earliest available comparison of 03/08/2016 and probably benign, but may merit surveillance. 4. Other imaging findings of potential clinical significance: Old granulomatous disease. Aortoiliac atherosclerotic vascular disease. Lumbar spondylosis and degenerative disc disease. Stable amount of trace free pelvic fluid.   04/27/2018 Imaging   04/27/2018 CT CAP IMPRESSION: Stable exam. No evidence of recurrent or metastatic carcinoma within the chest, abdomen, or pelvis   04/19/2019 Imaging   CT CAP W Contrast  IMPRESSION: Chest Impression:   1. No evidence of thoracic metastasis. 2. Stable small bilateral pulmonary nodules.   Abdomen / Pelvis Impression:   1. No evidence local colorectal carcinoma recurrence or metastasis in the abdomen pelvis. 2. Post RIGHT hemicolectomy.   01/22/2021 Imaging  CT CAP  IMPRESSION: CT CHEST IMPRESSION   1. Similar nonspecific pulmonary nodules. 2. New posterior left upper lobe reticulonodular opacity, suspicious for interval mild infection or inflammation. 3. No thoracic adenopathy.   CT ABDOMEN AND PELVIS IMPRESSION   1. Further  decrease in size of high left hepatic lobe 3 mm low-density lesion. No new or progressive metastatic disease within the abdomen or pelvis. 2. Similar trace free pelvic fluid. 3. Similar nonspecific mid rectal wall thickening. 4.  Aortic Atherosclerosis (ICD10-I70.0).   04/23/2021 Imaging   CT CAP  IMPRESSION: 1. Treated metastatic lesion between segments 2 and 3 of the liver, slightly smaller and less distinct than prior examination. No other signs of definite metastatic disease elsewhere in the abdomen or pelvis. 2. Multiple small pulmonary nodules, stable compared to the prior examination, favored to be benign. No definitive findings to suggest metastatic disease to the thorax. 3. Aortic atherosclerosis. 4. Additional incidental findings, as above.   08/13/2021 Imaging   EXAM: CT CHEST, ABDOMEN, AND PELVIS WITH CONTRAST  IMPRESSION: 1. A previously seen PET avid lesion of the anterior left lobe of the liver, hepatic segment II, is no longer discretely appreciable consistent with treatment response of a hepatic metastasis. 2. No evidence of new metastatic disease in the chest, abdomen, or pelvis. 3. Interval increase in a small focus of consolidation and nodularity of the medial left upper lobe, consistent with minimal, ongoing atypical infection. Additional tiny bilateral pulmonary nodules are stable and almost certainly incidental benign. Attention on follow-up. 4. Status post right hemicolectomy and ileocolic anastomosis.   07/02/2022 Imaging    IMPRESSION: CHEST IMPRESSION:   1. No evidence of thoracic metastasis. 2. Stable small pulmonary nodules.   PELVIS IMPRESSION:   1. Stable to slight decrease in size of subcapsular lesion in the RIGHT hepatic lobe. 2. No evidence of new or progressive disease in the abdomen pelvis. 3.  Aortic Atherosclerosis (ICD10-I70.0).     12/31/2022 Imaging    IMPRESSION: 1. Right hepatic lobe metastasis has undergone  mild-to-moderate enlargement since 10/07/2022. A left hepatic lobe 5 mm lesion is not readily apparent on the prior, suspicious for a new metastasis. 2. Similar nonspecific tiny pulmonary nodules. 3. Similar amorphous soft tissue thickening within the posterior superior right hemipelvis, hypermetabolic on prior PET and indeterminate for residual disease versus scarring. 4. Similar equivocal soft tissue fullness within the rectum. 5. Increased size of an abdominopelvic ventral wall nodule for which subcutaneous metastasis are a concern. 6. New trace right pelvic fluid. 7. Incidental findings, including: Aortic Atherosclerosis (ICD10-I70.0). 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 - Patent side-to-side ileo-colonic anastomosis, characterized by healthy appearing mucosa. - The examined portion of the ileum was normal. - One diminutive polyp in the proximal transverse colon, removed with a cold biopsy forceps. Resected and retrieved. - Likely malignant partially obstructing tumor in the mid rectum. Biopsied. Tattooed. - The examination was otherwise normal on direct and retroflexion views.   06/15/2020 Initial Biopsy   Diagnosis 1. Transverse Colon Polyp - HYPERPLASTIC POLYP 2. Rectum, biopsy - ADENOCARCINOMA ARISING IN A TUBULAR ADENOMA WITH HIGH-GRADE DYSPLASIA. SEE NOTE Diagnosis Note 2. Dr. Luisa Hart reviewed the case and concurs with the diagnosis. Dr. [name] was notified on 06/16/2020.   06/28/2020 Imaging  CT CAP  IMPRESSION: 1. New low-density focus in the anterior aspect of the lateral segment LEFT hepatic lobe measuring 1.2 x 1.0 cm, compatible with small metastatic lesion in the LEFT hepatic lobe. 2.  Soft tissue in the RIGHT iliac fossa following RIGHT hemicolectomy invades the psoas musculature and is slowly enlarging over time, more linear on the prior study now highly concerning for recurrence/metastasis to this location. 3. Signs of enteritis, potentially post radiation changes of the small bowel. Tethered small bowel in the RIGHT lower quadrant shows focal thickening and narrowing suspicious for small bowel involvement and developing partial obstruction though currently contrast passes beyond this point into the colon. 4. Rectal thickening in this patient with known rectal mass as described. 5. No evidence of metastatic disease in the chest. 6. Stable small pulmonary nodules. 7.  and aortic atherosclerosis.   Aortic Atherosclerosis (ICD10-I70.0) and Emphysema (ICD10-J43.9).   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 Exam positive for FDG avid rectal tumor which corresponds to the recent colonoscopy findings. 2. FDG avid soft tissue mass within the right iliac fossa is noted and consistent with local tumor recurrence from previous ascending colon tumor. 3. Lateral segment left lobe of liver lesion is FDG avid concerning for liver metastasis. 4. No specific findings identified to suggest metastatic disease to the chest.   08/10/2020 -  Chemotherapy   First-line FOLFIRI q2weeks starting 08/10/20. dose reduced with cycle 1. Irinotecan/5FU increased and Bevacizumab added with cycle 2 on 08/23/2020    08/17/2020 - 03/09/2022 Chemotherapy   Patient is on Treatment Plan : COLORECTAL FOLFIRI + Bevacizumab q14d     10/27/2020 Imaging   CT CAP  IMPRESSION: 1. Interval decrease in size of the hypermetabolic left hepatic lesion, consistent with metastatic disease. No new liver lesion evident. 2. Interval resolution of the hypermetabolic soft tissue lesion along the right iliac fossa with no measurable soft tissue  lesion remaining at this location today. 3. Similar appearance of soft tissue fullness in the rectum at the site of the hypermetabolic lesion seen previously. 4. Stable tiny bilateral pulmonary nodules. Continued attention on follow-up recommended. 5. Small volume free fluid in the pelvis. 6. Aortic Atherosclerosis (ICD10-I70.0).   01/22/2021 Imaging   CT CAP  IMPRESSION: CT CHEST IMPRESSION   1. Similar nonspecific pulmonary nodules. 2. New posterior left upper lobe reticulonodular opacity, suspicious for interval mild infection or inflammation. 3. No thoracic adenopathy.   CT ABDOMEN AND PELVIS IMPRESSION   1. Further decrease in size of high left hepatic lobe 3 mm low-density lesion. No new or progressive metastatic disease within the abdomen or pelvis. 2. Similar trace free pelvic fluid. 3. Similar nonspecific mid rectal wall thickening. 4.  Aortic Atherosclerosis (ICD10-I70.0).   04/23/2021 Imaging   CT CAP  IMPRESSION: 1. Treated metastatic lesion between segments 2 and 3 of the liver, slightly smaller and less distinct than prior examination. No other signs of definite metastatic disease elsewhere in the abdomen or pelvis. 2. Multiple small pulmonary nodules, stable compared to the prior examination, favored to be benign. No definitive findings to suggest metastatic disease to the thorax. 3. Aortic atherosclerosis. 4. Additional incidental findings, as above.   08/13/2021 Imaging   EXAM: CT CHEST, ABDOMEN, AND PELVIS WITH CONTRAST  IMPRESSION: 1. A previously seen PET avid lesion of the anterior left lobe of the liver, hepatic segment II, is no longer discretely appreciable consistent with treatment response of  a hepatic metastasis. 2. No evidence of new metastatic disease in the chest, abdomen, or pelvis. 3. Interval increase in a small focus of consolidation and nodularity of the medial left upper lobe, consistent with minimal, ongoing atypical infection.  Additional tiny bilateral pulmonary nodules are stable and almost certainly incidental benign. Attention on follow-up. 4. Status post right hemicolectomy and ileocolic anastomosis.   03/28/2022 - 04/05/2023 Chemotherapy   Patient is on Treatment Plan : COLORECTAL FOLFOX + Bevacizumab q14d     07/02/2022 Imaging    IMPRESSION: CHEST IMPRESSION:   1. No evidence of thoracic metastasis. 2. Stable small pulmonary nodules.   PELVIS IMPRESSION:   1. Stable to slight decrease in size of subcapsular lesion in the RIGHT hepatic lobe. 2. No evidence of new or progressive disease in the abdomen pelvis.   10/07/2022 Imaging    IMPRESSION: 1. Stable hypovascular mass in the periphery of the right lobe of the liver, which likely represents a treated metastatic lesion. Stable subcentimeter lesion just medial to this is also similar to the recent prior examination. No new hepatic lesions are otherwise noted. 2. Persistent but stable poorly defined soft tissue thickening in the right lower quadrant associated with multiple small bowel loops and the overlying iliopsoas musculature, which corresponds to focal hypermetabolism on remote prior PET-CT. Given the stability, this likely represents a treated metastatic lesion. Continued attention on follow-up studies is recommended. 3. Persistent mass-like thickening in the proximal rectum corresponding to previously noted hypermetabolic rectal neoplasm on prior PET-CT. This currently measures approximately 2.8 x 2.3 cm and is slightly more apparent than the most recent prior study. 4. Multiple small pulmonary nodules generally stable compared to the prior study, with exception of a new branching nodule in the anterior aspect of the left upper lobe measuring 7 x 3 mm (mean diameter 5 mm). This is nonspecific. Close attention on follow-up studies is recommended to ensure stability. 5. Aortic atherosclerosis. 6. Additional incidental findings, as  above.     12/31/2022 Imaging    IMPRESSION: 1. Right hepatic lobe metastasis has undergone mild-to-moderate enlargement since 10/07/2022. A left hepatic lobe 5 mm lesion is not readily apparent on the prior, suspicious for a new metastasis. 2. Similar nonspecific tiny pulmonary nodules. 3. Similar amorphous soft tissue thickening within the posterior superior right hemipelvis, hypermetabolic on prior PET and indeterminate for residual disease versus scarring. 4. Similar equivocal soft tissue fullness within the rectum. 5. Increased size of an abdominopelvic ventral wall nodule for which subcutaneous metastasis are a concern. 6. New trace right pelvic fluid. 7. Incidental findings, including: Aortic Atherosclerosis (ICD10-I70.0). Possible constipation   05/08/2023 -  Chemotherapy   Patient is on Treatment Plan : COLORECTAL Bevacizumab + Trifluridine/Tipiracil q28d        Discussed the use of AI scribe software for clinical note transcription with the patient, who gave verbal consent to proceed.  History of Present Illness   A 73 year old female with a history of metastatic colon cancer presents with worsening symptoms. She reports significant weight loss of almost 10 pounds, which she attributes to a lack of appetite and decreased food intake. She also reports bilateral lower extremity edema, which is more pronounced in the right leg. The edema has led to decreased mobility, particularly in the right leg, which she describes as heavy and difficult to move. She spends most of her time sitting and sleeps on a sofa, not elevating her legs. She denies any pain associated with the edema. She also reports increased  fatigue and weakness, which has further limited her mobility and daily activities. She has not been on chemotherapy due to her weakened state.         All other systems were reviewed with the patient and are negative.  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 Some residual intraretinal fluid remains, very small localized subfoveal of fluid remains although this continues to slowly resorb. We'll continue to observe.   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  ALLERGIES:  is allergic to venofer [iron sucrose], fish allergy, peanut-containing drug products, soy allergy (do not select), and buspirone.  MEDICATIONS:  Current Outpatient Medications  Medication Sig Dispense Refill   megestrol (MEGACE ES) 625 MG/5ML suspension Take 5 mLs (625 mg total) by mouth daily. 150 mL 0   ALPRAZolam (XANAX) 0.25 MG tablet Take 1 tablet (0.25 mg total) by mouth daily as needed for anxiety. 30 tablet 0   Cholecalciferol (VITAMIN D3 PO) Take by mouth daily.     docusate sodium (COLACE) 100 MG capsule 1 capsule as  needed     gabapentin (NEURONTIN) 100 MG capsule Take 1 capsule (100 mg total) by mouth at bedtime. 30 capsule 1   lidocaine-prilocaine (EMLA) cream Apply 1 Application topically as needed. 30 g 1   LONSURF 20-8.19 MG tablet TAKE 2 TABLETS BY MOUTH IN THE MORNING AND 3 TABLETS IN THE EVENING ON DAYS 1-5 AND DAYS 8-12 OF A 28 DAY CYCLE. TAKE WITHIN 1 HOUR AFTER MORNING AND EVENING MEALS. 60 tablet 1   NORVASC 2.5 MG tablet TAKE 1 TABLET BY MOUTH EVERY DAY 90 tablet 1   ondansetron (ZOFRAN) 8 MG tablet Take 1 tablet (8 mg total) by mouth every 8 (eight) hours as needed for nausea or vomiting. 20 tablet 2   prochlorperazine (COMPAZINE) 10 MG tablet Take 1 tablet (10 mg total) by mouth every 6 (six) hours as needed for nausea or vomiting. 30 tablet 2   Current Facility-Administered Medications  Medication Dose Route Frequency Provider Last Rate Last Admin   0.9 %  sodium chloride infusion  500 mL Intravenous Once Danis, Andreas Blower, MD        PHYSICAL EXAMINATION: ECOG PERFORMANCE STATUS: 3 -  Symptomatic, >50% confined to bed  Vitals:   07/17/23 0925  BP: 115/85  Pulse: (!) 117  Resp: 17  Temp: 97.9 F (36.6 C)  SpO2: 96%   Wt Readings from Last 3 Encounters:  07/17/23 86 lb 3.2 oz (39.1 kg)  07/03/23 94 lb 12.8 oz (43 kg)  06/30/23 91 lb 12.8 oz (41.6 kg)     GENERAL:alert, no distress and comfortable SKIN: skin color, texture, turgor are normal, no rashes or significant lesions EYES: normal, Conjunctiva are pink and non-injected, sclera clear NECK: supple, thyroid normal size, non-tender, without nodularity LYMPH:  no palpable lymphadenopathy in the cervical, axillary  LUNGS: clear to auscultation and percussion with normal breathing effort HEART: regular rate & rhythm and no murmurs and no lower extremity edema ABDOMEN:abdomen soft, non-tender and normal bowel sounds Musculoskeletal:no cyanosis of digits and no clubbing  NEURO: alert & oriented x 3 with fluent speech, decreased  strength in right lower extremity. EXTREMITIES: Bilateral lower extremity edema observed, more pronounced around the ankles. No pain elicited upon palpation of the posterior aspect. Right leg demonstrates limited mobility, with inability to lift or perform extension movements.      LABORATORY DATA:  I have reviewed the data as listed    Latest Ref Rng & Units 07/17/2023    9:08 AM 06/30/2023    9:37 AM 06/19/2023    9:20 AM  CBC  WBC 4.0 - 10.5 K/uL 4.2  3.5  1.0   Hemoglobin 12.0 - 15.0 g/dL 9.7  8.7  8.7   Hematocrit 36.0 - 46.0 % 29.9  26.4  26.5   Platelets 150 - 400 K/uL 160  189  81         Latest Ref Rng & Units 07/17/2023    8:51 AM 06/30/2023    9:37 AM 06/19/2023    9:20 AM  CMP  Glucose 70 - 99 mg/dL 91  91  91   BUN 8 - 23 mg/dL 17  16  12    Creatinine 0.44 - 1.00 mg/dL 5.78  4.69  6.29   Sodium 135 - 145 mmol/L 133  131  134   Potassium 3.5 - 5.1 mmol/L 3.5  3.3  3.5   Chloride 98 - 111 mmol/L 93  95  99   CO2 22 - 32 mmol/L 27  27  27    Calcium 8.9 - 10.3 mg/dL 9.0  8.7  8.8   Total Protein 6.5 - 8.1 g/dL 7.2  7.2  7.0   Total Bilirubin 0.0 - 1.2 mg/dL 0.9  0.6  0.6   Alkaline Phos 38 - 126 U/L 82  69  79   AST 15 - 41 U/L 17  17  13    ALT 0 - 44 U/L 7  11  6        RADIOGRAPHIC STUDIES: I have personally reviewed the radiological images as listed and agreed with the findings in the report. No results found.    No orders of the defined types were placed in this encounter.  All questions were answered. The patient knows to call the clinic with any problems, questions or concerns. No barriers to learning was detected. The total time spent in the appointment was 40 minutes.     [name], MD 07/17/2023

## 2023-07-18 NOTE — Telephone Encounter (Signed)
Pt's daughter Aliene Beams) called stated that the pt decided she would like to be on Hospice.  Danita stated the pt would like to be referred to [facility] [place].  Notified Dr. Mosetta Putt of the pt's request.  Referral packet faxed to [facility] [place] with fax confirmation received.

## 2023-07-21 ENCOUNTER — Other Ambulatory Visit: Payer: Federal, State, Local not specified - PPO

## 2023-07-21 ENCOUNTER — Ambulatory Visit: Payer: Federal, State, Local not specified - PPO

## 2023-07-21 ENCOUNTER — Ambulatory Visit: Payer: Federal, State, Local not specified - PPO | Admitting: Hematology

## 2023-07-23 ENCOUNTER — Other Ambulatory Visit: Payer: Self-pay

## 2023-07-23 ENCOUNTER — Emergency Department (HOSPITAL_COMMUNITY): Payer: Federal, State, Local not specified - PPO

## 2023-07-23 ENCOUNTER — Emergency Department (HOSPITAL_COMMUNITY)
Admission: EM | Admit: 2023-07-23 | Discharge: 2023-07-24 | Disposition: A | Discharge status: Hospice | Payer: Federal, State, Local not specified - PPO | Attending: Emergency Medicine | Admitting: Emergency Medicine

## 2023-07-23 DIAGNOSIS — I10 Essential (primary) hypertension: Secondary | ICD-10-CM | POA: Diagnosis not present

## 2023-07-23 DIAGNOSIS — M25551 Pain in right hip: Secondary | ICD-10-CM | POA: Diagnosis present

## 2023-07-23 DIAGNOSIS — R000 Tachycardia, unspecified: Secondary | ICD-10-CM | POA: Insufficient documentation

## 2023-07-23 DIAGNOSIS — Z91010 Allergy to peanuts: Secondary | ICD-10-CM | POA: Insufficient documentation

## 2023-07-23 DIAGNOSIS — Z515 Encounter for palliative care: Secondary | ICD-10-CM | POA: Insufficient documentation

## 2023-07-23 DIAGNOSIS — Z85038 Personal history of other malignant neoplasm of large intestine: Secondary | ICD-10-CM | POA: Diagnosis not present

## 2023-07-23 DIAGNOSIS — G893 Neoplasm related pain (acute) (chronic): Secondary | ICD-10-CM | POA: Diagnosis not present

## 2023-07-23 DIAGNOSIS — R627 Adult failure to thrive: Secondary | ICD-10-CM

## 2023-07-23 DIAGNOSIS — R410 Disorientation, unspecified: Secondary | ICD-10-CM | POA: Insufficient documentation

## 2023-07-23 DIAGNOSIS — R531 Weakness: Secondary | ICD-10-CM

## 2023-07-23 DIAGNOSIS — R1084 Generalized abdominal pain: Secondary | ICD-10-CM | POA: Diagnosis not present

## 2023-07-23 LAB — BASIC METABOLIC PANEL
Anion gap: 15 (ref 5–15)
BUN: 78 mg/dL — ABNORMAL HIGH (ref 8–23)
CO2: 26 mmol/L (ref 22–32)
Calcium: 8.5 mg/dL — ABNORMAL LOW (ref 8.9–10.3)
Chloride: 107 mmol/L (ref 98–111)
Creatinine, Ser: 1.89 mg/dL — ABNORMAL HIGH (ref 0.44–1.00)
GFR, Estimated: 28 mL/min — ABNORMAL LOW (ref >60)
Glucose, Bld: 139 mg/dL — ABNORMAL HIGH (ref 70–99)
Potassium: 4 mmol/L (ref 3.5–5.1)
Sodium: 148 mmol/L — ABNORMAL HIGH (ref 135–145)

## 2023-07-23 LAB — CBC
HCT: 32.7 % — ABNORMAL LOW (ref 36.0–46.0)
Hemoglobin: 9.8 g/dL — ABNORMAL LOW (ref 12.0–15.0)
MCH: 28.2 pg (ref 26.0–34.0)
MCHC: 30 g/dL (ref 30.0–36.0)
MCV: 94.2 fL (ref 80.0–100.0)
Platelets: 116 10*3/uL — ABNORMAL LOW (ref 150–400)
RBC: 3.47 MIL/uL — ABNORMAL LOW (ref 3.87–5.11)
RDW: 18 % — ABNORMAL HIGH (ref 11.5–15.5)
WBC: 7.1 10*3/uL (ref 4.0–10.5)
nRBC: 0.7 % — ABNORMAL HIGH (ref 0.0–0.2)

## 2023-07-23 MED ORDER — MORPHINE SULFATE 10 MG/5ML PO SOLN
5.0000 mg | ORAL | Status: DC | PRN
  Administered 2023-07-23: 5 mg via ORAL
  Filled 2023-07-23: qty 4

## 2023-07-23 MED ORDER — MORPHINE SULFATE 10 MG/5ML PO SOLN: 2.5000 mg | ORAL | Status: DC | PRN

## 2023-07-23 NOTE — Progress Notes (Addendum)
CSW has acknowledge TOC consult, awaiting provider to see patient.   [place] Kalesha Irving LCSW-A   07/23/2023 7:36 PM

## 2023-07-23 NOTE — ED Triage Notes (Signed)
Pt BIBA from home. EMS reports family concerned about L hip. Assessment show possible shortening and rotation in R leg. Pt winces when R hip is touched.  Pt is hospice pt, and is getting morphine at home.  PT at baseline

## 2023-07-23 NOTE — Progress Notes (Addendum)
Transition of Care [facility]) - Emergency Department Mini Assessment   Patient Details  Name: Brittney Spencer MRN: [id number] Date of Birth: 1951/06/06  Transition of Care [facility]) CM/SW Contact:    Georgie Chard, LCSW Phone Number: 07/23/2023, 8:11 PM   Clinical Narrative: CSW spoke to the patient's daughter Aliene Beams [phone] daughter has reported that the patient has been with [facility] however, the patient will not eat or drink so, at this time the daughter would like mom to go to [facility]. Daughter also stated that patient is declining treatment. Patient's daughter stated that she has already reached out to [facility] to make them aware of wanting to switch to [facility].   CSW has reached out to on call Hospital Liaison from [place] at [phone]. This CSW spoke to [facility] the Triage on call nurse. Debra informed CSW that she would take the patient's information and send to their referral team, in which the scheduled Hospital Liaison will reach out to [facility] in the AM for any additional information.CSW has informed the provider that the referral has been made. Patient's daughter is also aware that the referral has been sent to [facility] and that a liaison will reach out in the AM. TOC will continue to follow for any additional need.    ED Mini Assessment: What brought you to the Emergency Department? : Pt BIBA from home. EMS reports family concerned about L hip. Assessment show possible shortening and rotation in R leg. Pt winces when R hip is touched.  Barriers to Discharge: No Barriers Identified        Interventions which prevented an admission or readmission: Hospice (Daughter is wanting IP Hospice)    Patient Contact and Communications Key Contact 1: (P) Aliene Beams [phone]   Spoke with: (P) Patients daughter Contact Date: (P) 07/23/23,   Contact time: (P) 2003 Contact Phone Number: (P) [phone] Call outcome:  (P) Referral for IP hospice  Patient states their goals for this hospitalization and ongoing recovery are:: (P) IP Hospice      Admission diagnosis:  Possbile Broken Hip Patient Active Problem List   Diagnosis Date Noted   Localized swelling of right lower extremity 02/20/2023   Peripheral neuropathy due to chemotherapy (HCC) 11/14/2022   DNR (do not resuscitate) 04/25/2022   Encounter for antineoplastic chemotherapy 11/29/2021   Macular hole of right eye 02/20/2021   Macular pucker, right eye 02/20/2021   Port-A-Cath in place 08/10/2020   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) 07/04/2020   History of vitrectomy 03/28/2020   Vitreomacular traction, left 03/28/2020   Nuclear sclerotic cataract of left eye 03/28/2020   Follow-up examination after eye surgery 10/21/2019   Essential hypertension 03/26/2016   Iron deficiency anemia due to chronic blood loss 03/26/2016   Cancer of ascending colon (HCC) 02/07/2016   Low hemoglobin 01/05/2016   Perforated appendicitis 09/15/2015   PCP:  Patient, No Pcp Per Pharmacy:   [id number] [place], [place] - [address] [place] [address] Phone: [phone] Fax: [phone]  [facility] [address] [place] [address] Phone: [phone] Fax: [phone]  [facility] [place], [place] - [address] [place] [address] Phone: [phone] Fax: [phone]

## 2023-07-23 NOTE — ED Provider Notes (Signed)
[place] EMERGENCY DEPARTMENT AT [facility] Provider Note   CSN: [id number] Arrival date & time: 07/23/23  1818     History  Chief Complaint  Patient presents with   Hip Pain    Brittney Spencer is a 73 y.o. female.   Hip Pain  Patient is on hospice.  Reported right hip pain.  Has chronic neuropathy.  Has intra-abdominal cancer and recently transition to hospice/comfort care.  Had been doing worse at home.  Reportedly was being transferred to hospital bed and seem to be worse on the right side.  Has not eaten in a day or 2.  Was recently driving.  Patient's sister and daughter are here.  Patient cannot really provide much history.  Very worried about hip injury although there has been no fall. They see [facility] hospitalist and they are reportedly no inpatient beds.  Reportedly patient was given morphine at home today.  Family states there is availability at [facility].    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 Some residual intraretinal fluid remains, very small localized subfoveal of fluid remains although this continues to slowly resorb. We'll continue to observe.   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   Resolved March 2021 post vitrectomy    Home Medications Prior to Admission medications   Medication Sig Start Date End Date Taking? Authorizing Provider  ALPRAZolam (XANAX) 0.25 MG  tablet Take 1 tablet (0.25 mg total) by mouth daily as needed for anxiety. 01/24/22   [name], NP  Cholecalciferol (VITAMIN D3 PO) Take by mouth daily.    [provider]  docusate sodium (COLACE) 100 MG capsule 1 capsule as needed    [provider]  gabapentin (NEURONTIN) 100 MG capsule Take 1 capsule (100 mg total) by mouth at bedtime. 02/12/23   [name], NP  lidocaine-prilocaine (EMLA) cream Apply 1 Application topically as needed. 08/29/22   [name], MD  megestrol (MEGACE ES) 625 MG/5ML suspension Take 5 mLs (625 mg total) by mouth daily. 07/17/23   [name], MD  NORVASC 2.5 MG tablet TAKE 1 TABLET BY MOUTH EVERY DAY 04/07/23   [name], NP  ondansetron (ZOFRAN) 8 MG tablet Take 1 tablet (8 mg total) by mouth every 8 (eight) hours as needed for nausea or vomiting. 04/29/23   [name], MD  prochlorperazine (COMPAZINE) 10 MG tablet Take 1 tablet (10 mg total) by mouth every 6 (six) hours as needed for nausea or vomiting. 04/29/23   [name], MD      Allergies    Venofer Maralyn Sago sucrose], Fish allergy, Peanut-containing drug products, Soy allergy (do not select), and Buspirone    Review of Systems   Review of Systems  Physical Exam Updated Vital Signs BP 93/68   Pulse (!) 118   Temp 98.3  F (36.8 C) (Oral)   Resp 15   SpO2 99%  Physical Exam Vitals and nursing note reviewed.  Cardiovascular:     Rate and Rhythm: Tachycardia present.  Abdominal:     Tenderness: There is abdominal tenderness.     Comments: Somewhat diffuse abdominal tenderness without distention or clear mass.  Musculoskeletal:     Comments: Some tenderness to bilateral calves.  Some tenderness with right hip.  Neurological:     Comments: Cachectic with decreased mental status.  Answer some commands but overall confused.     ED Results / Procedures / Treatments   Labs (all labs ordered are listed, but only abnormal results are displayed) Labs Reviewed  BASIC METABOLIC  PANEL - Abnormal; Notable for the following components:      Result Value   Sodium 148 (*)    Glucose, Bld 139 (*)    BUN 78 (*)    Creatinine, Ser 1.89 (*)    Calcium 8.5 (*)    GFR, Estimated 28 (*)    All other components within normal limits  CBC - Abnormal; Notable for the following components:   RBC 3.47 (*)    Hemoglobin 9.8 (*)    HCT 32.7 (*)    RDW 18.0 (*)    Platelets 116 (*)    nRBC 0.7 (*)    All other components within normal limits    EKG None  Radiology DG Pelvis Portable Result Date: 07/23/2023 CLINICAL DATA:  Abdominal pain. EXAM: PORTABLE PELVIS 1-2 VIEWS; PORTABLE ABDOMEN - 1 VIEW COMPARISON:  None Available. FINDINGS: There is large amount of stool throughout the colon. No bowel dilatation or evidence of obstruction. No free air. Degenerative changes of the spine. No acute osseous pathology. The soft tissues are unremarkable. IMPRESSION: Large colonic stool burden. No bowel obstruction. Electronically Signed   By: Elgie Collard M.D.   On: 07/23/2023 19:15   DG Abd Portable 1 View Result Date: 07/23/2023 CLINICAL DATA:  Abdominal pain. EXAM: PORTABLE PELVIS 1-2 VIEWS; PORTABLE ABDOMEN - 1 VIEW COMPARISON:  None Available. FINDINGS: There is large amount of stool throughout the colon. No bowel dilatation or evidence of obstruction. No free air. Degenerative changes of the spine. No acute osseous pathology. The soft tissues are unremarkable. IMPRESSION: Large colonic stool burden. No bowel obstruction. Electronically Signed   By: Elgie Collard M.D.   On: 07/23/2023 19:15    Procedures Procedures    Medications Ordered in ED Medications  morphine 10 MG/5ML solution 5 mg (5 mg Oral Given 07/23/23 2155)    ED Course/ Medical Decision Making/ A&P                                 Medical Decision Making Amount and/or Complexity of Data Reviewed Labs: ordered. Radiology: ordered.  Risk Prescription drug management.   Patient in hospice.  Has not  been eating and drinking.  Comfort care only.  However doing worse at home.  Basic blood work done and does show worsening renal function.  However for now we will not encourage too much fluid.  Oral morphine given.  Discussed with transitions of care.  They will help with placement potentially to [facility].  Will remain in the ER overnight.  Discussed with patient's family.  Patient was already DNR in the computer, however made comfort care only.         Final Clinical Impression(s) / ED Diagnoses  Final diagnoses:  Palliative care encounter    Rx / DC Orders ED Discharge Orders     None         Benjiman Core, MD 07/23/23 2256

## 2023-07-24 DIAGNOSIS — G893 Neoplasm related pain (acute) (chronic): Secondary | ICD-10-CM

## 2023-07-24 DIAGNOSIS — Z515 Encounter for palliative care: Secondary | ICD-10-CM

## 2023-07-24 MED ORDER — ACETAMINOPHEN 650 MG RE SUPP: 650.0000 mg | Freq: Four times a day (QID) | RECTAL | Status: DC | PRN

## 2023-07-24 MED ORDER — GLYCOPYRROLATE 1 MG PO TABS: 1.0000 mg | ORAL_TABLET | ORAL | Status: DC | PRN

## 2023-07-24 MED ORDER — GLYCOPYRROLATE 0.2 MG/ML IJ SOLN: 0.2000 mg | INTRAMUSCULAR | Status: DC | PRN

## 2023-07-24 MED ORDER — LORAZEPAM 2 MG/ML IJ SOLN: 1.0000 mg | INTRAMUSCULAR | Status: DC | PRN

## 2023-07-24 MED ORDER — LORAZEPAM 1 MG PO TABS: 1.0000 mg | ORAL_TABLET | ORAL | Status: DC | PRN

## 2023-07-24 MED ORDER — ACETAMINOPHEN 325 MG PO TABS: 650.0000 mg | ORAL_TABLET | Freq: Four times a day (QID) | ORAL | Status: DC | PRN

## 2023-07-24 MED ORDER — MORPHINE SULFATE (PF) 2 MG/ML IV SOLN
2.0000 mg | INTRAVENOUS | Status: DC | PRN
  Administered 2023-07-24: 2 mg via INTRAVENOUS
  Filled 2023-07-24: qty 1

## 2023-07-24 MED ORDER — ONDANSETRON 4 MG PO TBDP: 4.0000 mg | ORAL_TABLET | Freq: Four times a day (QID) | ORAL | Status: DC | PRN

## 2023-07-24 MED ORDER — LORAZEPAM 2 MG/ML PO CONC: 1.0000 mg | ORAL | Status: DC | PRN

## 2023-07-24 MED ORDER — ONDANSETRON HCL 4 MG/2ML IJ SOLN: 4.0000 mg | Freq: Four times a day (QID) | INTRAMUSCULAR | Status: DC | PRN

## 2023-07-24 NOTE — Discharge Planning (Signed)
This pt is current with [facility] and has been approved for the [facility] in [facility]. Daughter has accepted the bed offer. Pt can d/c and they can take her today.

## 2023-07-24 NOTE — Discharge Instructions (Signed)
Go to inpatient hospice center as arranged.

## 2023-07-24 NOTE — ED Provider Notes (Signed)
Signed out that pt/family had requested inpatient hospice given progression of symptoms.   Palliative care consult placed. TOC team indicates patient has bed at [facility], and can be d/c there.   Pt alert, no distress.   Pt appears stable for transport to inpatient hospice.      Cathren Laine, MD 07/24/23 1253

## 2023-07-24 NOTE — ED Notes (Signed)
Report given to hospice- they will send transportation, no ETA

## 2023-07-24 NOTE — Consult Note (Signed)
Consultation Note Date: 07/24/2023   Patient Name: Brittney Spencer  DOB: May 04, 1951  MRN: [id number]  Age / Sex: 73 y.o., female  PCP: Patient, No Pcp Per Referring Physician: Cathren Laine, MD  Reason for Consultation:  inpatient hospice, symptom management  HPI/Patient Profile: 73 y.o. female  with past medical history of rectal cancer with mets to lung and liver, AAA, anxiety, GERD, HTN  presented to ED on  07/23/2023 with R hip pain. No diagnostics completed as patient is a hospice patient and goals of care are establilshed as comfort measures only. Palliative consulted for goals of care and symptom management.    Primary Decision Maker NEXT OF KIN - daughters Denita and Octavio Graves  Discussion: Chart reviewed including labs, progress notes, imaging from this and previous encounters.  Evaluated patient in the ED. Her daughters were at bedside.  Patient looked comfortable.  She was nonverbal.  Family reports significant decline since Friday. They note Friday she was sitting up and signing paperwork, however, today is somnolent, she has not eaten for two days and has had pain in her R hip area (notably she does have a R pelvic mass).  Daughters would like for her to placed in inpatient Hospice. She is a [facility] patient, however, they did not have a bed for her last night and that is why they presented to the ED.  They considered changing hospices to [facility] so they could seek placement at [facility]- however, now [facility] is offering a bed and they are in agreement.     SUMMARY OF RECOMMENDATIONS -Continue comfort measures only- no further diagnostics, comfort medications ordered -Agree with placement for inpatient Hospice- discussed with [facility] liaison    Code Status/Advance Care Planning: DNR   Prognosis:   < 2 weeks  Discharge Planning:  Hospice facility  Primary Diagnoses: Present on Admission: **None**   Review of Systems  Unable to perform ROS: Acuity of condition    Physical Exam Vitals and nursing note reviewed.  Constitutional:      Comments: Frail, cachetic  Musculoskeletal:     Comments: Diffuse muscle wasting  Neurological:     Comments: nonverbal     Vital Signs: BP 94/65   Pulse (!) 109   Temp 99.5 F (37.5 C) (Oral)   Resp 19   SpO2 97%  Pain Scale: Faces       SpO2: SpO2: 97 % O2 Device:SpO2: 97 % O2 Flow Rate: .   IO: Intake/output summary: No intake or output data in the 24 hours ending 07/24/23 1107  LBM:   Baseline Weight:   Most recent weight:         Thank you for this consult. Palliative medicine will continue to follow and assist as needed.   Signed by: Ocie Bob, AGNP-C Palliative Medicine  Time includes:   Preparing to see the patient (e.g., review of tests) Obtaining and/or reviewing separately obtained history Performing a medically necessary appropriate examination and/or evaluation Counseling and  educating the patient/family/caregiver Ordering medications, tests, or procedures Referring and communicating with other health care professionals (when not reported separately) Documenting clinical information in the electronic or other health record Independently interpreting results (not reported separately) and communicating results to the patient/family/caregiver Care coordination (not reported separately) Clinical documentation   Please contact Palliative Medicine Team phone at [phone] for questions and concerns.  For individual provider: See Loretha Stapler

## 2023-07-24 NOTE — Discharge Planning (Signed)
RNCM consulted regarding residential hospice.  Pt active with [facility] [place], who reportedly does not have a residential bed available.  RNCM contacted Liaison, Cheri who will get an update on their bed availability in the morning meeting (0900) and update on me with results.  TOC will continue to follow.

## 2023-07-24 NOTE — Discharge Planning (Signed)
35: RNCM contacted by Cheri of [facility].  Cheri informs that they do have a bed to offer the pt if family agrees to it and my MD approves.  1030:  RNCM reached out for a status up date.  Cheri reports that pt has been approved and she is waiting on daughter to call me back.

## 2023-07-24 NOTE — ED Notes (Signed)
Patient's depend changed. Peri care done

## 2023-08-02 DEATH — deceased

## 2023-08-04 ENCOUNTER — Ambulatory Visit: Payer: Federal, State, Local not specified - PPO

## 2023-08-04 ENCOUNTER — Other Ambulatory Visit: Payer: Federal, State, Local not specified - PPO

## 2023-08-04 ENCOUNTER — Ambulatory Visit: Payer: Federal, State, Local not specified - PPO | Admitting: Hematology

## 2023-08-06 ENCOUNTER — Encounter: Payer: Self-pay | Admitting: Hematology

## 2023-08-18 ENCOUNTER — Ambulatory Visit: Payer: Federal, State, Local not specified - PPO | Admitting: Nurse Practitioner

## 2023-08-18 ENCOUNTER — Other Ambulatory Visit: Payer: Federal, State, Local not specified - PPO

## 2023-08-18 ENCOUNTER — Ambulatory Visit: Payer: Federal, State, Local not specified - PPO

## 2023-10-08 ENCOUNTER — Other Ambulatory Visit: Payer: Self-pay | Admitting: Nurse Practitioner

## 2023-10-08 DIAGNOSIS — C182 Malignant neoplasm of ascending colon: Secondary | ICD-10-CM
